# Patient Record
Sex: Female | Born: 1935
Health system: Southern US, Community
[De-identification: ages and names within clinical notes are randomized; demographics above are authoritative.]

## PROBLEM LIST (undated history)

## (undated) DIAGNOSIS — K219 Gastro-esophageal reflux disease without esophagitis: Secondary | ICD-10-CM

## (undated) DIAGNOSIS — J449 Chronic obstructive pulmonary disease, unspecified: Secondary | ICD-10-CM

## (undated) DIAGNOSIS — D649 Anemia, unspecified: Secondary | ICD-10-CM

## (undated) DIAGNOSIS — I209 Angina pectoris, unspecified: Secondary | ICD-10-CM

## (undated) DIAGNOSIS — C4492 Squamous cell carcinoma of skin, unspecified: Secondary | ICD-10-CM

## (undated) DIAGNOSIS — N760 Acute vaginitis: Secondary | ICD-10-CM

## (undated) DIAGNOSIS — E785 Hyperlipidemia, unspecified: Secondary | ICD-10-CM

## (undated) DIAGNOSIS — G8929 Other chronic pain: Secondary | ICD-10-CM

## (undated) DIAGNOSIS — R1310 Dysphagia, unspecified: Secondary | ICD-10-CM

## (undated) DIAGNOSIS — M51369 Other intervertebral disc degeneration, lumbar region without mention of lumbar back pain or lower extremity pain: Secondary | ICD-10-CM

## (undated) DIAGNOSIS — I4891 Unspecified atrial fibrillation: Secondary | ICD-10-CM

## (undated) DIAGNOSIS — M542 Cervicalgia: Secondary | ICD-10-CM

## (undated) DIAGNOSIS — I2510 Atherosclerotic heart disease of native coronary artery without angina pectoris: Secondary | ICD-10-CM

## (undated) DIAGNOSIS — I10 Essential (primary) hypertension: Secondary | ICD-10-CM

## (undated) DIAGNOSIS — C4491 Basal cell carcinoma of skin, unspecified: Secondary | ICD-10-CM

## (undated) DIAGNOSIS — F319 Bipolar disorder, unspecified: Secondary | ICD-10-CM

## (undated) DIAGNOSIS — M549 Dorsalgia, unspecified: Secondary | ICD-10-CM

## (undated) DIAGNOSIS — M5136 Other intervertebral disc degeneration, lumbar region: Secondary | ICD-10-CM

## (undated) DIAGNOSIS — N189 Chronic kidney disease, unspecified: Secondary | ICD-10-CM

## (undated) DIAGNOSIS — I509 Heart failure, unspecified: Secondary | ICD-10-CM

## (undated) DIAGNOSIS — E119 Type 2 diabetes mellitus without complications: Secondary | ICD-10-CM

## (undated) HISTORY — DX: Anemia, unspecified: D64.9

## (undated) HISTORY — DX: Chronic kidney disease, unspecified: N18.9

## (undated) HISTORY — DX: Acute vaginitis: N76.0

## (undated) HISTORY — DX: Unspecified atrial fibrillation: I48.91

## (undated) HISTORY — DX: Other intervertebral disc degeneration, lumbar region: M51.36

## (undated) HISTORY — DX: Essential (primary) hypertension: I10

## (undated) HISTORY — DX: Other intervertebral disc degeneration, lumbar region without mention of lumbar back pain or lower extremity pain: M51.369

## (undated) HISTORY — DX: Other chronic pain: G89.29

## (undated) HISTORY — PX: OTHER SURGICAL HISTORY: SHX169

## (undated) HISTORY — DX: Chronic obstructive pulmonary disease, unspecified: J44.9

## (undated) HISTORY — DX: Type 2 diabetes mellitus without complications: E11.9

## (undated) HISTORY — PX: VAGINAL HYSTERECTOMY: SUR661

## (undated) HISTORY — DX: Heart failure, unspecified: I50.9

## (undated) HISTORY — DX: Squamous cell carcinoma of skin, unspecified: C44.92

## (undated) HISTORY — DX: Dysphagia, unspecified: R13.10

## (undated) HISTORY — DX: Cervicalgia: M54.2

## (undated) HISTORY — DX: Basal cell carcinoma of skin, unspecified: C44.91

## (undated) HISTORY — DX: Other chronic pain: M54.9

## (undated) HISTORY — DX: Hyperlipidemia, unspecified: E78.5

## (undated) HISTORY — DX: Angina pectoris, unspecified: I20.9

---

## 2004-09-05 ENCOUNTER — Emergency Department: Payer: Self-pay | Admitting: Emergency Medicine

## 2004-11-03 ENCOUNTER — Ambulatory Visit: Payer: Self-pay | Admitting: Gastroenterology

## 2004-11-25 ENCOUNTER — Ambulatory Visit: Payer: Self-pay | Admitting: Gastroenterology

## 2004-12-09 ENCOUNTER — Ambulatory Visit (HOSPITAL_COMMUNITY): Admission: RE | Admit: 2004-12-09 | Discharge: 2004-12-09 | Payer: Self-pay | Admitting: Gastroenterology

## 2005-05-26 ENCOUNTER — Ambulatory Visit: Payer: Self-pay | Admitting: Family Medicine

## 2005-09-29 ENCOUNTER — Emergency Department: Payer: Self-pay | Admitting: Emergency Medicine

## 2006-03-17 ENCOUNTER — Ambulatory Visit: Payer: Self-pay | Admitting: Unknown Physician Specialty

## 2006-04-07 ENCOUNTER — Ambulatory Visit: Payer: Self-pay | Admitting: Family Medicine

## 2006-08-16 ENCOUNTER — Ambulatory Visit: Payer: Self-pay | Admitting: Family Medicine

## 2006-10-30 ENCOUNTER — Emergency Department: Payer: Self-pay | Admitting: Unknown Physician Specialty

## 2007-07-20 ENCOUNTER — Ambulatory Visit: Payer: Self-pay | Admitting: Ophthalmology

## 2007-07-20 ENCOUNTER — Other Ambulatory Visit: Payer: Self-pay

## 2007-07-31 ENCOUNTER — Ambulatory Visit: Payer: Self-pay | Admitting: Ophthalmology

## 2007-09-04 ENCOUNTER — Ambulatory Visit: Payer: Self-pay | Admitting: Ophthalmology

## 2007-09-25 ENCOUNTER — Ambulatory Visit: Payer: Self-pay | Admitting: Ophthalmology

## 2007-11-02 ENCOUNTER — Ambulatory Visit: Payer: Self-pay | Admitting: Family Medicine

## 2008-08-11 ENCOUNTER — Emergency Department: Payer: Self-pay | Admitting: Emergency Medicine

## 2008-10-08 ENCOUNTER — Ambulatory Visit: Payer: Self-pay | Admitting: Urology

## 2008-10-21 ENCOUNTER — Ambulatory Visit: Payer: Self-pay | Admitting: Urology

## 2008-10-24 ENCOUNTER — Ambulatory Visit: Payer: Self-pay | Admitting: Urology

## 2008-11-18 ENCOUNTER — Emergency Department: Payer: Self-pay | Admitting: Emergency Medicine

## 2008-11-26 ENCOUNTER — Ambulatory Visit: Payer: Self-pay | Admitting: Urology

## 2008-12-18 ENCOUNTER — Ambulatory Visit: Payer: Self-pay | Admitting: Family Medicine

## 2008-12-31 ENCOUNTER — Ambulatory Visit: Payer: Self-pay | Admitting: Unknown Physician Specialty

## 2009-01-20 ENCOUNTER — Ambulatory Visit: Payer: Self-pay | Admitting: Unknown Physician Specialty

## 2009-01-20 LAB — HM COLONOSCOPY

## 2009-06-05 ENCOUNTER — Ambulatory Visit: Payer: Self-pay | Admitting: Specialist

## 2009-06-12 ENCOUNTER — Ambulatory Visit: Payer: Self-pay | Admitting: Specialist

## 2009-07-16 ENCOUNTER — Ambulatory Visit: Payer: Self-pay | Admitting: Neurology

## 2009-12-19 ENCOUNTER — Ambulatory Visit: Payer: Self-pay | Admitting: Family Medicine

## 2010-08-17 ENCOUNTER — Ambulatory Visit: Payer: Self-pay | Admitting: Family Medicine

## 2010-09-02 ENCOUNTER — Ambulatory Visit: Payer: Self-pay | Admitting: Vascular Surgery

## 2011-04-19 ENCOUNTER — Ambulatory Visit: Payer: Self-pay | Admitting: Unknown Physician Specialty

## 2011-06-07 ENCOUNTER — Ambulatory Visit: Payer: Self-pay | Admitting: Unknown Physician Specialty

## 2011-07-19 ENCOUNTER — Ambulatory Visit: Payer: Self-pay | Admitting: Vascular Surgery

## 2011-10-26 DIAGNOSIS — C44211 Basal cell carcinoma of skin of unspecified ear and external auricular canal: Secondary | ICD-10-CM | POA: Insufficient documentation

## 2012-02-06 ENCOUNTER — Inpatient Hospital Stay: Payer: Self-pay | Admitting: Internal Medicine

## 2012-02-06 LAB — CBC
MCH: 25.6 pg — ABNORMAL LOW (ref 26.0–34.0)
MCV: 81 fL (ref 80–100)
Platelet: 256 10*3/uL (ref 150–440)
RDW: 15.9 % — ABNORMAL HIGH (ref 11.5–14.5)
WBC: 12.8 10*3/uL — ABNORMAL HIGH (ref 3.6–11.0)

## 2012-02-06 LAB — COMPREHENSIVE METABOLIC PANEL
Albumin: 3.7 g/dL (ref 3.4–5.0)
Alkaline Phosphatase: 73 U/L (ref 50–136)
Bilirubin,Total: 0.4 mg/dL (ref 0.2–1.0)
Calcium, Total: 9.1 mg/dL (ref 8.5–10.1)
EGFR (African American): 28 — ABNORMAL LOW
Glucose: 159 mg/dL — ABNORMAL HIGH (ref 65–99)
Osmolality: 272 (ref 275–301)
Potassium: 3.7 mmol/L (ref 3.5–5.1)
SGOT(AST): 22 U/L (ref 15–37)
SGPT (ALT): 20 U/L
Sodium: 133 mmol/L — ABNORMAL LOW (ref 136–145)
Total Protein: 8.1 g/dL (ref 6.4–8.2)

## 2012-02-06 LAB — TROPONIN I: Troponin-I: <0.02 ng/mL

## 2012-02-06 LAB — CK TOTAL AND CKMB (NOT AT ARMC): CK-MB: <0.5 ng/mL — ABNORMAL LOW (ref 0.5–3.6)

## 2012-02-08 LAB — CBC WITH DIFFERENTIAL/PLATELET
Basophil %: 0.7 %
Eosinophil #: 0.3 10*3/uL (ref 0.0–0.7)
HCT: 30.1 % — ABNORMAL LOW (ref 35.0–47.0)
HGB: 9.9 g/dL — ABNORMAL LOW (ref 12.0–16.0)
Lymphocyte #: 2.5 10*3/uL (ref 1.0–3.6)
Lymphocyte %: 33.1 %
MCV: 80 fL (ref 80–100)
Monocyte #: 1 x10 3/mm — ABNORMAL HIGH (ref 0.2–0.9)
Neutrophil %: 49.6 %
Platelet: 206 10*3/uL (ref 150–440)
RBC: 3.76 10*6/uL — ABNORMAL LOW (ref 3.80–5.20)
RDW: 15.9 % — ABNORMAL HIGH (ref 11.5–14.5)

## 2012-02-08 LAB — BASIC METABOLIC PANEL
BUN: 14 mg/dL (ref 7–18)
Creatinine: 1.46 mg/dL — ABNORMAL HIGH (ref 0.60–1.30)
EGFR (Non-African Amer.): 34 — ABNORMAL LOW
Glucose: 133 mg/dL — ABNORMAL HIGH (ref 65–99)
Osmolality: 274 (ref 275–301)
Sodium: 136 mmol/L (ref 136–145)

## 2012-04-05 ENCOUNTER — Encounter: Payer: Self-pay | Admitting: Sports Medicine

## 2012-04-27 ENCOUNTER — Encounter: Payer: Self-pay | Admitting: Sports Medicine

## 2012-07-01 ENCOUNTER — Emergency Department: Payer: Self-pay | Admitting: Emergency Medicine

## 2012-08-15 DIAGNOSIS — R351 Nocturia: Secondary | ICD-10-CM | POA: Insufficient documentation

## 2012-09-05 DIAGNOSIS — R339 Retention of urine, unspecified: Secondary | ICD-10-CM | POA: Insufficient documentation

## 2012-10-12 ENCOUNTER — Ambulatory Visit: Payer: Self-pay | Admitting: Family Medicine

## 2012-11-28 DIAGNOSIS — N949 Unspecified condition associated with female genital organs and menstrual cycle: Secondary | ICD-10-CM | POA: Insufficient documentation

## 2013-09-24 ENCOUNTER — Ambulatory Visit: Payer: Self-pay | Admitting: Obstetrics and Gynecology

## 2013-10-16 ENCOUNTER — Ambulatory Visit: Payer: Self-pay | Admitting: Orthopedic Surgery

## 2014-05-01 DIAGNOSIS — C44310 Basal cell carcinoma of skin of unspecified parts of face: Secondary | ICD-10-CM | POA: Insufficient documentation

## 2014-06-26 DIAGNOSIS — R079 Chest pain, unspecified: Secondary | ICD-10-CM | POA: Insufficient documentation

## 2014-06-26 DIAGNOSIS — I10 Essential (primary) hypertension: Secondary | ICD-10-CM | POA: Insufficient documentation

## 2014-06-26 DIAGNOSIS — I739 Peripheral vascular disease, unspecified: Secondary | ICD-10-CM

## 2014-06-26 DIAGNOSIS — M791 Myalgia, unspecified site: Secondary | ICD-10-CM | POA: Insufficient documentation

## 2014-06-26 DIAGNOSIS — I6529 Occlusion and stenosis of unspecified carotid artery: Secondary | ICD-10-CM | POA: Insufficient documentation

## 2014-07-08 DIAGNOSIS — Z86718 Personal history of other venous thrombosis and embolism: Secondary | ICD-10-CM | POA: Insufficient documentation

## 2014-07-08 DIAGNOSIS — J449 Chronic obstructive pulmonary disease, unspecified: Secondary | ICD-10-CM | POA: Insufficient documentation

## 2014-07-08 DIAGNOSIS — E1129 Type 2 diabetes mellitus with other diabetic kidney complication: Secondary | ICD-10-CM | POA: Insufficient documentation

## 2014-07-08 DIAGNOSIS — J441 Chronic obstructive pulmonary disease with (acute) exacerbation: Secondary | ICD-10-CM | POA: Insufficient documentation

## 2014-07-08 DIAGNOSIS — F411 Generalized anxiety disorder: Secondary | ICD-10-CM | POA: Insufficient documentation

## 2014-07-08 DIAGNOSIS — N189 Chronic kidney disease, unspecified: Secondary | ICD-10-CM | POA: Insufficient documentation

## 2014-07-08 DIAGNOSIS — E119 Type 2 diabetes mellitus without complications: Secondary | ICD-10-CM | POA: Insufficient documentation

## 2014-07-08 DIAGNOSIS — C439 Malignant melanoma of skin, unspecified: Secondary | ICD-10-CM | POA: Insufficient documentation

## 2014-07-08 DIAGNOSIS — K921 Melena: Secondary | ICD-10-CM | POA: Insufficient documentation

## 2014-07-08 DIAGNOSIS — R269 Unspecified abnormalities of gait and mobility: Secondary | ICD-10-CM | POA: Insufficient documentation

## 2014-07-08 DIAGNOSIS — K219 Gastro-esophageal reflux disease without esophagitis: Secondary | ICD-10-CM | POA: Insufficient documentation

## 2014-07-10 DIAGNOSIS — I480 Paroxysmal atrial fibrillation: Secondary | ICD-10-CM | POA: Insufficient documentation

## 2014-10-13 ENCOUNTER — Emergency Department: Payer: Self-pay | Admitting: Emergency Medicine

## 2014-10-13 LAB — BASIC METABOLIC PANEL
Anion Gap: 8 (ref 7–16)
BUN: 21 mg/dL — ABNORMAL HIGH (ref 7–18)
CALCIUM: 8.8 mg/dL (ref 8.5–10.1)
CHLORIDE: 98 mmol/L (ref 98–107)
Co2: 31 mmol/L (ref 21–32)
Creatinine: 1.57 mg/dL — ABNORMAL HIGH (ref 0.60–1.30)
EGFR (African American): 41 — ABNORMAL LOW
EGFR (Non-African Amer.): 33 — ABNORMAL LOW
GLUCOSE: 94 mg/dL (ref 65–99)
OSMOLALITY: 277 (ref 275–301)
POTASSIUM: 3.6 mmol/L (ref 3.5–5.1)
SODIUM: 137 mmol/L (ref 136–145)

## 2014-10-13 LAB — CBC
HCT: 41.6 % (ref 35.0–47.0)
HGB: 13.9 g/dL (ref 12.0–16.0)
MCH: 30.4 pg (ref 26.0–34.0)
MCHC: 33.4 g/dL (ref 32.0–36.0)
MCV: 91 fL (ref 80–100)
PLATELETS: 251 10*3/uL (ref 150–440)
RBC: 4.57 10*6/uL (ref 3.80–5.20)
RDW: 13.2 % (ref 11.5–14.5)
WBC: 11.6 10*3/uL — ABNORMAL HIGH (ref 3.6–11.0)

## 2014-10-13 LAB — TROPONIN I: Troponin-I: <0.02 ng/mL

## 2014-10-13 LAB — PRO B NATRIURETIC PEPTIDE: B-TYPE NATIURETIC PEPTID: 113 pg/mL (ref 0–450)

## 2014-12-30 DIAGNOSIS — E782 Mixed hyperlipidemia: Secondary | ICD-10-CM | POA: Insufficient documentation

## 2014-12-31 ENCOUNTER — Ambulatory Visit
Admit: 2014-12-31 | Disposition: A | Discharge status: Home / Self Care | Payer: Self-pay | Attending: Family Medicine | Admitting: Family Medicine

## 2015-01-19 NOTE — Discharge Summary (Signed)
PATIENT NAME:  [name], [name] MR#:  [id number] DATE OF BIRTH:  August 23, 1929  DATE OF ADMISSION:  02/06/2012 DATE OF DISCHARGE:  02/08/2012  ADMITTING DIAGNOSIS: Fever cough, congestion.   DISCHARGE DIAGNOSES:  1. Fever, cough, congestion possibly due to SIRS syndrome as a result of acute bronchitis. Chest x-ray negative for pneumonia. Will be on p.o. antibiotics. Is currently afebrile.  2. Acute on chronic renal failure due to volume depletion, diuretic therapy. Diuretics are on hold.  3. Hypertension.  4. Type II diabetes. The patient was on metformin due to elevated creatinine. Recommend discontinuing metformin. The patient will be on glipizide.  5. Gastroesophageal reflux disease.  6. Anemia, likely anemia of chronic disease. Needs further evaluation as outpatient to determine if she needs any further evaluation such as EGD or colonoscopy. I will let her primary MD arrange that.  7. History of peripheral arterial disease.  8. Status post stent in the right leg due to blood clot.  9. Psoriasis.  10. Status post hysterectomy.  11. Status post cholecystectomy.   CONSULTANTS: None.   PERTINENT LABS AND EVALUATIONS: Admitting EKG showed sinus tachycardia.   Chest x-ray showed per Radiology interpretation findings suggestive of reactive airway disease or chronic obstructive pulmonary disease. No evidence of pneumonia or congestive heart failure.  Admitting WBC count 12.8, hemoglobin 11.1, platelet count 256. WBC count this morning 7.4, hemoglobin 9.9 after hydration. BMP on presentation showed glucose 159, BUN 18, creatinine 1.9, sodium 133, potassium 3.7, chloride 96, CO2 24. LFTs were normal. Troponin less than 0.02. Subsequent creatinine on May 14th was 1.46.   HOSPITAL COURSE: Please see history and physical done by the admitting physician. The patient is an 81 year old white female with history of hypertension and diabetes who came to the ED with complaint of fever, cough,  congestion. She also had some nausea and vomiting in the ED. The patient on presentation was noted to have a fever as well as tachycardia. Initially the chest x-ray was thought to have pneumonia. She was admitted and placed on IV Levaquin. With these treatments, she started to feel better. Official chest x-ray reading showed no pneumonia. Her cough is improved. Her WBC count has normalized. The patient also was felt to be a little dehydrated on presentation and was given IV fluids. Her diuretics were held. Her creatinine which was elevated on admission improved. She is a diabetic and her metformin had to be held due to creatinine being 1.90. She does have chronic renal failure which is mild, although her creatinine is around 1.40. Therefore, metformin has been discontinued and she was started on glipizide. At this time she is stable for discharge.   DISCHARGE MEDICATIONS:  1. Aspirin 81 one tab p.o. daily.  2. Alprazolam 0.5 one tab at bedtime.  3. Fluoxetine 40 q.a.m.  4. Diltiazem 180 daily.  5. Losartan 50 daily.  6. Protonix 40 daily.  7. PreserVision 1 tab p.o. daily.  8. Levaquin 500 p.o. daily x3 days.  9. Glipizide 5 mg p.o. daily.   DO NOT TAKE: The patient is told not to take metformin and not to take HCTZ/triamterene.  HOME OXYGEN: None.   DIET: Low sodium ADA diet.   FOLLOW-UP: Follow-up in 1 to 2 weeks with Dr. Venia Minks, primary MD.   TIME SPENT: 32 minutes.   ____________________________ Lafonda Mosses Posey Pronto, MD shp:drc D: 02/08/2012 13:05:09 ET T: 02/09/2012 09:02:01 ET JOB#: [id number] cc: Larae Caison H. Posey Pronto, MD, <Dictator>, Jerrell Belfast, MD [place] MD ELECTRONICALLY SIGNED  02/11/2012 14:44 

## 2015-01-19 NOTE — H&P (Signed)
PATIENT NAME:  [name], [name] MR#:  [id number] DATE OF BIRTH:  02/28/1929  DATE OF ADMISSION:  02/06/2012  PRIMARY CARE PHYSICIAN: Dr. Venia Minks   CHIEF COMPLAINT: Fever, cough and congestion.   HISTORY OF PRESENT ILLNESS: [name] is an 81 year old Caucasian female with past medical history of hypertension and diabetes comes to the Emergency Room with complaints of fever, cough, congestion, and headache since yesterday. She had a low-grade fever of 99.1, was mildly tachycardic, had vomited x4 in the Emergency Room. She was feeling quite nauseated, appeared somewhat dehydrated clinically. Chest x-ray shows possible early right lower lobe infiltrate versus atelectasis. Patient is being admitted for SIRS secondary to upper respiratory infection/acute bronchitis/questionable early infiltrate right lower lobe.    PAST MEDICAL HISTORY:  1. History of peripheral arterial disease.  2. Stent in the right leg due to blood clot.  3. Psoriasis.  4. Type 2 diabetes.  5. Hypertension.  6. Hysterectomy.  7. Cholecystectomy.   ALLERGIES: Ibuprofen, Motrin, Neosporin, Polysporin and adhesive tape.   MEDICATIONS:  1. Xanax 0.5 mg p.o. daily at bedtime.  2. Aspirin 81 mg daily.  3. Diltiazem CD 180 mg p.o. daily.  4. Fluoxetine 40 mg p.o. daily.  5. Hydrochlorothiazide/triamterene 25/37.5 mg p.o. daily.  6. Losartan 50 mg daily.  7. Metformin 1000 mg b.i.d.  8. Pantoprazole 40 mg daily.  9. PreserVision 1 tablet daily.   FAMILY HISTORY: Father died of myocardial infarction.   SOCIAL HISTORY: Patient lives at home. Nonsmoker. Nonalcoholic.   REVIEW OF SYSTEMS: CONSTITUTIONAL: Positive for fever, fatigue, weakness. EYES: No blurred or double vision. ENT: No tinnitus, ear pain, hearing loss. RESPIRATORY: Positive for some cough, sore throat. Mild dyspnea. No chronic obstructive pulmonary disease. CARDIOVASCULAR: No chest pain, orthopnea, edema. Positive for hypertension. GASTROINTESTINAL:  Positive for nausea, vomiting, and gastroesophageal reflux disease. No abdominal pain, melena or rectal bleed. GENITOURINARY: No dysuria, hematuria. ENDOCRINE: No polyuria, nocturia. HEMATOLOGY: No anemia or easy bruising. SKIN: No acne, rash. MUSCULOSKELETAL: Positive for arthritis. NEUROLOGIC: No cerebrovascular accident, transient ischemic attack. PSYCH: No anxiety or depression. All other systems reviewed and negative.   PHYSICAL EXAMINATION:  GENERAL: Patient is awake, alert, oriented x3, not in acute distress.   VITAL SIGNS: Temperature 99.1, pulse 99, respirations 20, blood pressure 199/71, sats 91% on room air.   HEENT: Atraumatic, normocephalic. Pupils are equal, round, and reactive to light and accommodation. Extraocular movements intact. Oral mucosa is dry.   NECK: Supple. No JVD. No carotid bruit.   RESPIRATORY: Decreased breath sounds in the bases. Minimal few crackles are present in the right lower lobe. No respiratory distress or labored breathing or use of accessory muscles.   CARDIOVASCULAR: Tachycardia present. No murmur heard. PMI not lateralized. Chest nontender.   EXTREMITIES: Good pedal pulses, good femoral pulses. No lower extremity edema.   ABDOMEN: Soft, benign, nontender. No organomegaly. Positive bowel sounds.   NEUROLOGIC: Grossly intact cranial nerves II through XII. No motor or sensory deficits.   PSYCH: Patient is awake, alert, oriented x3.   LABORATORY, DIAGNOSTIC AND RADIOLOGICAL DATA: EKG shows sinus tachycardia. Chest x-ray shows possible right lower lobe infiltrate versus atelectasis. White count 12.8, hemoglobin and hematocrit 11.1 and 35.4, platelet count 256, glucose 159, BUN 18, creatinine 1.9, sodium 133, potassium 3.7, chloride 96, bicarbonate 24. LFTs within normal limits. Hemoglobin and hematocrit 11.1 and 35.6, platelet count 256.   ASSESSMENT: 81 year old [name] with: 1. Systemic inflammatory response syndrome due to left lower lobe  infiltrate versus acute bronchitis/upper  respiratory infection congestion. Patient presented with low-grade fever, tachycardia and elevated white count.  2. Nausea, vomiting and dehydration, baseline creatinine around 1.4.  3. Hypertension.  4. Type 2 diabetes.  5. Gastroesophageal reflux disease.   PLAN:  1. Admit patient to medical floor.  2. FULL CODE.  3. Will continue IV fluids for hydration.  4. Continue IV Levaquin 500 mg daily.  5. I will continue home medications except hold off on losartan, hydrochlorothiazide and metformin given elevated creatinine. Continue rest of the medications.  6. P.R.N. hydralazine for elevated blood pressure.  7. Follow-up CBC, blood cultures and sputum culture if patient able to produce.  8. Will start patient on clear liquid diet, advance as tolerated when vomiting subsides.  9. Further work-up according to patient's clinical course. Hospital admission plan was discussed with patient and patient's husband who was present in the Emergency Room.   TIME SPENT: 50 minutes.    ____________________________ Hart Rochester Posey Pronto, MD sap:cms D: 02/06/2012 21:26:44 ET T: 02/07/2012 06:19:35 ET  JOB#: [id number] cc: Karron Goens A. Posey Pronto, MD, <Dictator> Jerrell Belfast, MD Ilda Basset MD ELECTRONICALLY SIGNED 02/07/2012 9:42

## 2015-01-23 ENCOUNTER — Emergency Department
Admit: 2015-01-23 | Disposition: A | Discharge status: Home / Self Care | Payer: Self-pay | Admitting: Emergency Medicine

## 2015-01-23 LAB — URINALYSIS, COMPLETE
BACTERIA: NONE SEEN
BLOOD: NEGATIVE
Bilirubin,UR: NEGATIVE
GLUCOSE, UR: NEGATIVE mg/dL (ref 0–75)
Ketone: NEGATIVE
LEUKOCYTE ESTERASE: NEGATIVE
NITRITE: NEGATIVE
PROTEIN: NEGATIVE
Ph: 8 (ref 4.5–8.0)
SPECIFIC GRAVITY: 1.013 (ref 1.003–1.030)
Squamous Epithelial: NONE SEEN

## 2015-03-10 ENCOUNTER — Other Ambulatory Visit: Payer: Self-pay | Admitting: Family Medicine

## 2015-03-10 DIAGNOSIS — F419 Anxiety disorder, unspecified: Secondary | ICD-10-CM

## 2015-03-10 NOTE — Telephone Encounter (Signed)
RX printed. Please fax to [facility]. Thanks.

## 2015-03-10 NOTE — Telephone Encounter (Signed)
Last refill 09/04/2014. LOV 12/16/2014. Renaldo Fiddler, CMA

## 2015-04-01 ENCOUNTER — Other Ambulatory Visit: Payer: Self-pay | Admitting: Family Medicine

## 2015-04-01 DIAGNOSIS — G4700 Insomnia, unspecified: Secondary | ICD-10-CM

## 2015-04-01 DIAGNOSIS — R059 Cough, unspecified: Secondary | ICD-10-CM

## 2015-04-01 DIAGNOSIS — R05 Cough: Secondary | ICD-10-CM | POA: Insufficient documentation

## 2015-04-10 ENCOUNTER — Other Ambulatory Visit: Payer: Self-pay | Admitting: Family Medicine

## 2015-04-10 DIAGNOSIS — R05 Cough: Secondary | ICD-10-CM

## 2015-04-10 DIAGNOSIS — R059 Cough, unspecified: Secondary | ICD-10-CM

## 2015-04-10 NOTE — Telephone Encounter (Signed)
Pt contacted office for refill request on the following medications:  Hydrocodone-Homatropine 5-1.5mg /68ml.  CB#[phone]/[id number]

## 2015-04-11 MED ORDER — HYDROCODONE-HOMATROPINE 5-1.5 MG/5ML PO SYRP: 5.0000 mL | ORAL_SOLUTION | Freq: Two times a day (BID) | ORAL | Status: DC | PRN

## 2015-04-11 NOTE — Telephone Encounter (Signed)
Printed, please notify patient. Thanks.

## 2015-04-24 ENCOUNTER — Other Ambulatory Visit: Payer: Self-pay | Admitting: Family Medicine

## 2015-04-24 NOTE — Telephone Encounter (Signed)
Last ov was on 12/16/2014

## 2015-05-27 ENCOUNTER — Telehealth: Payer: Self-pay | Admitting: Family Medicine

## 2015-05-27 NOTE — Telephone Encounter (Signed)
Ok to refer? Please advise. Thanks!  

## 2015-05-27 NOTE — Telephone Encounter (Signed)
Pt is requesting a referral to see Dr. Aretta Nip with [facility]. Pt stated that she had been referred to him in the past and she has an appointment on Thursday 05/29/15. Pt stated that he has been treating her for a yeast infection. Thanks TNP

## 2015-05-29 ENCOUNTER — Telehealth: Payer: Self-pay | Admitting: Family Medicine

## 2015-05-29 ENCOUNTER — Encounter: Payer: Self-pay | Admitting: Obstetrics and Gynecology

## 2015-05-29 ENCOUNTER — Ambulatory Visit (INDEPENDENT_AMBULATORY_CARE_PROVIDER_SITE_OTHER): Payer: Commercial Managed Care - HMO | Admitting: Obstetrics and Gynecology

## 2015-05-29 VITALS — BP 186/70 | HR 81 | Ht 61.0 in | Wt 159.0 lb

## 2015-05-29 DIAGNOSIS — N760 Acute vaginitis: Secondary | ICD-10-CM | POA: Diagnosis not present

## 2015-05-29 DIAGNOSIS — N763 Subacute and chronic vulvitis: Secondary | ICD-10-CM | POA: Diagnosis not present

## 2015-05-29 HISTORY — PX: VULVA / PERINEUM BIOPSY: SUR155

## 2015-05-29 NOTE — Telephone Encounter (Signed)
Line is busy will try again. sd

## 2015-05-29 NOTE — Telephone Encounter (Signed)
Pt went to Dr Enzo Bi this am about her yeast infections.  She said they told her her blood pressure was 186/70 when she first checked in .  He decided to do 2 biopsies.  Checking for lichen schlerosis.  They checked her blood pressure again before she left and 199/70.  They wanted her to call you and let you know this.  Her call back  [phone].    Thanks [employer]

## 2015-05-29 NOTE — Patient Instructions (Signed)
1. Two vulvar biopsies taken. 2.  OK to shower or take bath. 3.  DO NOT scrub or rub biopsy sites.Use hair dryer to blow dry bottom. 4. Return in 2 weeks for recheck.  Condition may be Lichen Sclerosis

## 2015-05-29 NOTE — Progress Notes (Signed)
Patient ID: [name], female   DOB: 23-Nov-1928, 81 y.o.   MRN: [id number]   Chief complaint: 1.  Chronic vulvitis. 2.  Diabetes mellitus   Recurrent vulvovaginits using nystatin triamcinolone- helps but comes right back  Past history, past surgical history, problem list, medications, allergies, are reviewed and updated.  OBJECTIVE: BP 186/70 mmHg  Pulse 81  Ht 5\' 1"  (1.549 m)  Wt 159 lb (72.122 kg)  BMI 30.06 kg/m2 Pleasant, well-appearing, elderly female in no acute distress. Abdomen: Soft, nontender. Pelvic exam: External genitalia-atrophic changes; Symmetric perianal inflammation is noted.  Faint leukoplakia along right labia majora and in the perianal region.  Procedure: Vulvar biopsy 2: Verbal consent obtained.  Betadine cleansing performed.  One percent lidocaine without epinephrine was injected locally for anesthesia.  3 mm punch biopsy is taken of right labia majora and right perianal region.  3-0 Vicryl sutures placed for hemostasis.  Blood loss-minimal.Tolerated procedure well.  IMPRESSION: 1.  Chronic vulvitis, possibly lichen sclerosis versus chronic monilia infection. 2.  Diabetes mellitus, suboptimally controlled.  PLAN: 1.  Vulvar biopsy 2. 2.  Local wound care. 3.  Return in 2 weeks for follow-up and further management planning.

## 2015-05-29 NOTE — Telephone Encounter (Signed)
Patient reports she is feeling well. Patient denies headache, lightheadedness, and swelling. Patient reports she is not checking her BP at home. sd Per Dr. Venia Minks patient should come in for an ov. sd

## 2015-05-30 ENCOUNTER — Encounter: Payer: Self-pay | Admitting: Family Medicine

## 2015-05-30 ENCOUNTER — Ambulatory Visit (INDEPENDENT_AMBULATORY_CARE_PROVIDER_SITE_OTHER): Payer: Commercial Managed Care - HMO | Admitting: Family Medicine

## 2015-05-30 VITALS — BP 138/72 | HR 61 | Temp 97.9°F | Resp 16 | Ht 61.0 in | Wt 159.0 lb

## 2015-05-30 DIAGNOSIS — M549 Dorsalgia, unspecified: Secondary | ICD-10-CM | POA: Insufficient documentation

## 2015-05-30 DIAGNOSIS — R350 Frequency of micturition: Secondary | ICD-10-CM | POA: Insufficient documentation

## 2015-05-30 DIAGNOSIS — L409 Psoriasis, unspecified: Secondary | ICD-10-CM | POA: Insufficient documentation

## 2015-05-30 DIAGNOSIS — R600 Localized edema: Secondary | ICD-10-CM

## 2015-05-30 DIAGNOSIS — I739 Peripheral vascular disease, unspecified: Secondary | ICD-10-CM | POA: Insufficient documentation

## 2015-05-30 DIAGNOSIS — R300 Dysuria: Secondary | ICD-10-CM | POA: Insufficient documentation

## 2015-05-30 DIAGNOSIS — G4700 Insomnia, unspecified: Secondary | ICD-10-CM | POA: Diagnosis not present

## 2015-05-30 DIAGNOSIS — R2681 Unsteadiness on feet: Secondary | ICD-10-CM | POA: Insufficient documentation

## 2015-05-30 DIAGNOSIS — E7800 Pure hypercholesterolemia, unspecified: Secondary | ICD-10-CM | POA: Insufficient documentation

## 2015-05-30 DIAGNOSIS — D649 Anemia, unspecified: Secondary | ICD-10-CM | POA: Insufficient documentation

## 2015-05-30 DIAGNOSIS — I10 Essential (primary) hypertension: Secondary | ICD-10-CM

## 2015-05-30 DIAGNOSIS — N200 Calculus of kidney: Secondary | ICD-10-CM | POA: Insufficient documentation

## 2015-05-30 DIAGNOSIS — K2289 Other specified disease of esophagus: Secondary | ICD-10-CM | POA: Insufficient documentation

## 2015-05-30 DIAGNOSIS — J309 Allergic rhinitis, unspecified: Secondary | ICD-10-CM | POA: Insufficient documentation

## 2015-05-30 DIAGNOSIS — R609 Edema, unspecified: Secondary | ICD-10-CM | POA: Insufficient documentation

## 2015-05-30 DIAGNOSIS — E559 Vitamin D deficiency, unspecified: Secondary | ICD-10-CM | POA: Insufficient documentation

## 2015-05-30 DIAGNOSIS — W19XXXA Unspecified fall, initial encounter: Secondary | ICD-10-CM | POA: Insufficient documentation

## 2015-05-30 DIAGNOSIS — I130 Hypertensive heart and chronic kidney disease with heart failure and stage 1 through stage 4 chronic kidney disease, or unspecified chronic kidney disease: Secondary | ICD-10-CM | POA: Insufficient documentation

## 2015-05-30 DIAGNOSIS — E871 Hypo-osmolality and hyponatremia: Secondary | ICD-10-CM | POA: Insufficient documentation

## 2015-05-30 DIAGNOSIS — M25569 Pain in unspecified knee: Secondary | ICD-10-CM | POA: Insufficient documentation

## 2015-05-30 DIAGNOSIS — L299 Pruritus, unspecified: Secondary | ICD-10-CM | POA: Insufficient documentation

## 2015-05-30 DIAGNOSIS — Z6829 Body mass index (BMI) 29.0-29.9, adult: Secondary | ICD-10-CM | POA: Insufficient documentation

## 2015-05-30 DIAGNOSIS — Z86718 Personal history of other venous thrombosis and embolism: Secondary | ICD-10-CM | POA: Insufficient documentation

## 2015-05-30 DIAGNOSIS — K228 Other specified diseases of esophagus: Secondary | ICD-10-CM | POA: Insufficient documentation

## 2015-05-30 LAB — PATHOLOGY

## 2015-05-30 MED ORDER — ALPRAZOLAM 0.25 MG PO TABS: 0.2500 mg | ORAL_TABLET | Freq: Every evening | ORAL | Status: DC | PRN

## 2015-05-30 NOTE — Progress Notes (Signed)
Patient ID: [name], female   DOB: October 02, 1928, 81 y.o.   MRN: [id number]       Patient: [name] Female    DOB: 06-09-1929   81 y.o.   MRN: [id number] Visit Date: 05/30/2015  Today's Provider: Margarita Rana, MD   Chief Complaint  Patient presents with  . Hypertension   Subjective:    HPI  Hypertension, follow-up:  BP Readings from Last 3 Encounters:  05/30/15 138/72  05/29/15 186/70    She was last seen for hypertension 1 days ago at [facility].  BP at that visit was 186/70, before procedure at [facility]YN. 199/70 after procedure. Management changes since that visit include none. She reports excellent compliance with treatment. Takes her medication regularly.  She is not having side effects.  She is not exercising. She is adherent to low salt diet.   Outside blood pressures are not being checked. She is experiencing none.  Patient denies chest pain.   Cardiovascular risk factors include advanced age (older than 72 for men, 38 for women). Patient reports that she was nervous and anxious yesterday and reports that it may have caused her BP to be high.  Feels well today.      Weight trend: stable Wt Readings from Last 3 Encounters:  05/30/15 159 lb (72.122 kg)  05/29/15 159 lb (72.122 kg)    Current diet: in general, a "healthy" diet    ------------------------------------------------------------------------       Allergies  Allergen Reactions  . Neomycin-Bacitracin Zn-Polymyx Swelling  . Ibuprofen     Tachycardia.  . Valdecoxib Nausea And Vomiting  . Albuterol Rash  . Latex Rash  . Tape Rash    blisters  . Triamcinolone Rash   Previous Medications   ALPRAZOLAM (XANAX) 0.5 MG TABLET    TAKE ONE TABLET EVERY DAY   ASPIRIN 325 MG TABLET    Take 325 mg by mouth daily.    BENZONATATE (TESSALON) 100 MG CAPSULE    TAKE 1 TO 2 CAPSULES BY MOUTH 3 TIMES DAILY AS NEEDED   DILTIAZEM (CARDIZEM CD) 180 MG 24 HR CAPSULE    TAKE 1 CAP EVERY DAY   FLUOXETINE (PROZAC) 20 MG CAPSULE    Take 20 mg by mouth daily.    GLUCOSE BLOOD TEST STRIP       INSULIN GLARGINE (LANTUS SOLOSTAR) 100 UNIT/ML SOLOSTAR PEN    INJECT 15 UNITS SUBQ AT BEDTIME   INSULIN LISPRO (HUMALOG KWIKPEN) 100 UNIT/ML KIWKPEN    Inject into the skin.   LOSARTAN-HYDROCHLOROTHIAZIDE (HYZAAR) 50-12.5 MG PER TABLET    TAKE ONE TABLET EVERY DAY   MELATONIN 1 MG TABS    Take 3 mg by mouth daily.    PANTOPRAZOLE (PROTONIX) 40 MG TABLET    Take by mouth.   TRAZODONE (DESYREL) 50 MG TABLET    TAKE ONE TABLET BY MOUTH AT BEDTIME    Review of Systems  Constitutional: Negative.   Respiratory: Negative.   Cardiovascular: Negative.     Social History  Substance Use Topics  . Smoking status: Never Smoker   . Smokeless tobacco: Never Used  . Alcohol Use: No   Objective:   BP 138/72 mmHg  Pulse 61  Temp(Src) 97.9 F (36.6 C) (Oral)  Resp 16  Ht 5\' 1"  (1.549 m)  Wt 159 lb (72.122 kg)  BMI 30.06 kg/m2  SpO2 96%  Physical Exam  Constitutional: She is oriented to person, place, and time. She appears well-developed and well-nourished.  Cardiovascular: Normal  rate and regular rhythm.   Pulmonary/Chest: Effort normal and breath sounds normal.  Neurological: She is alert and oriented to person, place, and time.  Psychiatric: She has a normal mood and affect. Her behavior is normal. Judgment and thought content normal.       Assessment & Plan:     1. Benign essential HTN Stable.  Was elevated at [facility] but was nervous about procedure.   2. Insomnia Will work on decreasing some medication. Will try to taper off the Xanax, talked about it with patient and daughter and stop  Benadryl.    Margarita Rana, MD      Margarita Rana, MD  [facility]

## 2015-06-02 DIAGNOSIS — N763 Subacute and chronic vulvitis: Secondary | ICD-10-CM | POA: Insufficient documentation

## 2015-06-11 ENCOUNTER — Ambulatory Visit (INDEPENDENT_AMBULATORY_CARE_PROVIDER_SITE_OTHER): Payer: Commercial Managed Care - HMO | Admitting: Obstetrics and Gynecology

## 2015-06-11 ENCOUNTER — Encounter: Payer: Self-pay | Admitting: Obstetrics and Gynecology

## 2015-06-11 VITALS — BP 158/69 | HR 112 | Ht 61.0 in | Wt 158.0 lb

## 2015-06-11 DIAGNOSIS — N763 Subacute and chronic vulvitis: Secondary | ICD-10-CM | POA: Diagnosis not present

## 2015-06-11 MED ORDER — CLOBETASOL PROPIONATE 0.05 % EX OINT: TOPICAL_OINTMENT | CUTANEOUS | Status: DC

## 2015-06-11 NOTE — Patient Instructions (Signed)
1.  Apply the Temovate ointment twice a day for 2 weeks and then once a day for 2 weeks. 2.  Use a hair dryer to blow dry the perineum following showering. 3.  Return in 4 weeks for follow-up. 4.  The biopsy of the vulva shows chronic inflammation.  This will be best treated with the Temovate ointment.

## 2015-06-11 NOTE — Progress Notes (Signed)
Patient ID: [name], female   DOB: October 25, 1928, 81 y.o.   MRN: [id number]  Chief complaint: 1.  Chronic vulvitis. 2.  Follow-up on vulvar biopsy  2 week f/u vulvar bx resuts-  Sore slight oozing  No fevers  Pathology: Chronic inflammation with hyperkeratosis, parakeratosis, and acanthosis  OBJECTIVE: BP 158/69 mmHg  Pulse 112  Ht 5\' 1"  (1.549 m)  Wt 158 lb (71.668 kg)  BMI 29.87 kg/m2   Pleasant, well-appearing, elderly female in no acute distress. Abdomen: Soft, nontender. Pelvic exam: External genitalia-atrophic changes; Symmetric perianal inflammation is noted.  Biopsy sites are healing well with one residual Suture present.  IMPRESSION: 1.  Chronic vulvitis. 2.  Biopsy, benign.  PLAN: 1.  Temovate ointment twice a day 2 weeks followed by once a day 2 weeks. 2.  Return in 4 weeks for follow-up. 3.  Blowdry perineum when showering. 4.  Maintain control of blood sugar.  A total of 15 minutes were spent face-to-face with the patient during this encounter and over half of that time dealt with counseling and coordination of care.  Brayton Mars, MD

## 2015-06-24 ENCOUNTER — Encounter: Payer: Self-pay | Admitting: Family Medicine

## 2015-06-24 ENCOUNTER — Ambulatory Visit (INDEPENDENT_AMBULATORY_CARE_PROVIDER_SITE_OTHER): Payer: Commercial Managed Care - HMO | Admitting: Family Medicine

## 2015-06-24 VITALS — BP 168/72 | HR 76 | Temp 98.3°F | Resp 16 | Wt 158.0 lb

## 2015-06-24 DIAGNOSIS — Z23 Encounter for immunization: Secondary | ICD-10-CM

## 2015-06-24 DIAGNOSIS — G4700 Insomnia, unspecified: Secondary | ICD-10-CM | POA: Diagnosis not present

## 2015-06-24 DIAGNOSIS — E119 Type 2 diabetes mellitus without complications: Secondary | ICD-10-CM | POA: Diagnosis not present

## 2015-06-24 DIAGNOSIS — E1159 Type 2 diabetes mellitus with other circulatory complications: Secondary | ICD-10-CM | POA: Insufficient documentation

## 2015-06-24 DIAGNOSIS — F411 Generalized anxiety disorder: Secondary | ICD-10-CM

## 2015-06-24 DIAGNOSIS — I10 Essential (primary) hypertension: Secondary | ICD-10-CM | POA: Diagnosis not present

## 2015-06-24 MED ORDER — FLUOXETINE HCL 20 MG PO CAPS: 40.0000 mg | ORAL_CAPSULE | Freq: Every day | ORAL | Status: DC

## 2015-06-24 MED ORDER — TRAZODONE HCL 50 MG PO TABS: 75.0000 mg | ORAL_TABLET | Freq: Every evening | ORAL | Status: DC | PRN

## 2015-06-24 NOTE — Progress Notes (Signed)
Subjective:    Patient ID: [name], female    DOB: Jun 28, 1929, 81 y.o.   MRN: [id number]  Hypertension This is a new problem. The problem has been gradually worsening since onset. The problem is uncontrolled. Associated symptoms include blurred vision, malaise/fatigue and sweats. Pertinent negatives include no anxiety, chest pain, headaches, neck pain, orthopnea, palpitations, peripheral edema or shortness of breath. Treatments tried: Hyzaar 50-12.5 mg.  Insomnia Primary symptoms: sleep disturbance, difficulty falling asleep, frequent awakening (2-3 times per night), malaise/fatigue.  The problem occurs nightly. The problem is unchanged. How many beverages per day that contain caffeine: 2-3.  Types of beverages you drink: coffee and soda. Past treatments include medication. The treatment provided moderate relief. Typical bedtime:  10-11 P.M..  How long after going to bed to you fall asleep: 30-60 minutes.    Pt is here to talk about tapering Xanax. Pt currently takes 0.5 mg qhs, and is considering tapering it to Monday, Wednesday, Friday, and Sunday nights.  Back pain is getting better.   Review of Systems  Constitutional: Positive for malaise/fatigue.  Eyes: Positive for blurred vision.  Respiratory: Negative for shortness of breath.   Cardiovascular: Negative for chest pain, palpitations and orthopnea.  Musculoskeletal: Negative for neck pain.  Neurological: Negative for headaches.  Psychiatric/Behavioral: Positive for sleep disturbance. The patient has insomnia.    BP 168/72 mmHg  Pulse 76  Temp(Src) 98.3 F (36.8 C) (Oral)  Resp 16  Wt 158 lb (71.668 kg)   Patient Active Problem List   Diagnosis Date Noted  . Chronic vulvitis 06/02/2015  . Allergic rhinitis 05/30/2015  . Absolute anemia 05/30/2015  . Peripheral vascular disease with pain at rest 05/30/2015  . Back ache 05/30/2015  . Body mass index (BMI) of 29.0-29.9 in adult 05/30/2015  . Difficult or painful  urination 05/30/2015  . Edema extremities 05/30/2015  . Dilatation of esophagus 05/30/2015  . Fall 05/30/2015  . H/O deep venous thrombosis 05/30/2015  . Hypercholesteremia 05/30/2015  . Heart & renal disease, hypertensive, with heart failure 05/30/2015  . Below normal amount of sodium in the blood 05/30/2015  . Calculus of kidney 05/30/2015  . Gonalgia 05/30/2015  . Itch of skin 05/30/2015  . Psoriasis 05/30/2015  . Unsteadiness 05/30/2015  . FOM (frequency of micturition) 05/30/2015  . Avitaminosis D 05/30/2015  . Insomnia 04/01/2015  . Cough 04/01/2015  . Combined fat and carbohydrate induced hyperlipemia 12/30/2014  . AF (paroxysmal atrial fibrillation) 07/10/2014  . Abnormal gait 07/08/2014  . Anxiety state 07/08/2014  . Blood in feces 07/08/2014  . CAFL (chronic airflow limitation) 07/08/2014  . Chronic kidney disease 07/08/2014  . Acid reflux 07/08/2014  . Cutaneous malignant melanoma 07/08/2014  . H/O thrombosis 07/08/2014  . Diabetes mellitus, type 2 07/08/2014  . Benign essential HTN 06/26/2014  . Carotid artery narrowing 06/26/2014  . Chest pain 06/26/2014  . Basal cell carcinoma of face 05/01/2014  . Disease of female genital organs 11/28/2012  . Incomplete bladder emptying 09/05/2012  . Excessive urination at night 08/15/2012  . Basal cell carcinoma of ear 10/26/2011   Past Medical History  Diagnosis Date  . Vulvovaginitis   . Chronic back pain   . Hypertension   . DDD (degenerative disc disease), lumbar   . Diabetes mellitus without complication   . Atrial fibrillation   . Hyperlipemia   . Angina pectoris   . Cervicalgia   . Dysphagia    Current Outpatient Prescriptions on File Prior to Visit  Medication Sig  . ALPRAZolam (XANAX) 0.5 MG tablet at bedtime.   Marland Kitchen aspirin 325 MG tablet Take 325 mg by mouth daily.   . benzonatate (TESSALON) 100 MG capsule TAKE 1 TO 2 CAPSULES BY MOUTH 3 TIMES DAILY AS NEEDED  . clobetasol ointment (TEMOVATE) 0.05 % Apply  topically to the affected area twice a day for 2 weeks, then once a day for 2 weeks.  Marland Kitchen diltiazem (CARDIZEM CD) 180 MG 24 hr capsule TAKE 1 CAP EVERY DAY  . FLUoxetine (PROZAC) 20 MG capsule Take 20 mg by mouth daily.   Marland Kitchen glucose blood test strip   . Insulin Glargine (LANTUS SOLOSTAR) 100 UNIT/ML Solostar Pen INJECT 15 UNITS SUBQ AT BEDTIME  . insulin lispro (HUMALOG KWIKPEN) 100 UNIT/ML KiwkPen Inject into the skin.  Marland Kitchen losartan-hydrochlorothiazide (HYZAAR) 50-12.5 MG per tablet TAKE ONE TABLET EVERY DAY  . Melatonin 1 MG TABS Take 3 mg by mouth daily.   . pantoprazole (PROTONIX) 40 MG tablet Take by mouth.  . traZODone (DESYREL) 50 MG tablet TAKE ONE TABLET BY MOUTH AT BEDTIME   No current facility-administered medications on file prior to visit.   Allergies  Allergen Reactions  . Neomycin-Bacitracin Zn-Polymyx Swelling  . Ibuprofen     Tachycardia.  . Valdecoxib Nausea And Vomiting  . Albuterol Rash  . Latex Rash  . Tape Rash    blisters  . Triamcinolone Rash   Past Surgical History  Procedure Laterality Date  . Vaginal hysterectomy    . Melanoma      removal neck and back  . Gall bladder    . Ureterolithiasis      calculus removed  . Vulva / perineum biopsy  05/29/2015   Social History   Social History  . Marital Status: Widowed    Spouse Name: N/A  . Number of Children: N/A  . Years of Education: N/A   Occupational History  . Not on file.   Social History Main Topics  . Smoking status: Never Smoker   . Smokeless tobacco: Never Used  . Alcohol Use: No  . Drug Use: No  . Sexual Activity: No   Other Topics Concern  . Not on file   Social History Narrative   Family History  Problem Relation Age of Onset  . Diabetes Sister   . Colon cancer Brother   . Diabetes Brother   . Breast cancer Neg Hx   . Ovarian cancer Other       Objective:   Physical Exam  Constitutional: She is oriented to person, place, and time. She appears well-developed and  well-nourished.  Neurological: She is alert and oriented to person, place, and time.  Psychiatric: She has a normal mood and affect. Her behavior is normal. Judgment and thought content normal.   BP 168/72 mmHg  Pulse 76  Temp(Src) 98.3 F (36.8 C) (Oral)  Resp 16  Wt 158 lb (71.668 kg)      Assessment & Plan:  1. Benign essential HTN Stable. Continue current medication for now. May need increase if does not improve with treating sleep and mood.    2. Type 2 diabetes mellitus without complication Having lows. Stressed importance of monitoring this and figuring out how to prevent further lows.    3. Anxiety state Worsening. Will increase medication and recheck in 4 weeks.   - FLUoxetine (PROZAC) 20 MG capsule; Take 2 capsules (40 mg total) by mouth daily. 40 mg every day except 60 mg Monday, Wednesday and Friday.  Dispense: 90 capsule; Refill: 3  4. Insomnia Will increase Trazodone, noise machine for ringing in her ears and stop Melatonin and taper off Xanax. Reviewed changes with patient and daughter who fills her pill box.  - traZODone (DESYREL) 50 MG tablet; Take 1.5 tablets (75 mg total) by mouth at bedtime as needed for sleep.  Dispense: 45 tablet; Refill: 3  5. Need for influenza vaccination Given today.  - Flu vaccine HIGH DOSE PF  Margarita Rana, MD

## 2015-07-08 ENCOUNTER — Ambulatory Visit: Payer: Commercial Managed Care - HMO | Admitting: Obstetrics and Gynecology

## 2015-07-15 ENCOUNTER — Ambulatory Visit: Payer: Commercial Managed Care - HMO | Admitting: Obstetrics and Gynecology

## 2015-07-17 ENCOUNTER — Telehealth: Payer: Self-pay | Admitting: Family Medicine

## 2015-07-17 MED ORDER — HYDROCODONE-HOMATROPINE 5-1.5 MG/5ML PO SYRP: 5.0000 mL | ORAL_SOLUTION | Freq: Three times a day (TID) | ORAL | Status: DC | PRN

## 2015-07-17 NOTE — Telephone Encounter (Signed)
Pt contacted office for refill request on the following medications: HYDROcodone-homatropine (HYCODAN) 5-1.5 MG/5ML syrup to [facility]. Pt stated that she is coughing all day and night and the light cough pills are not helping. Pt stated if she can not get this medication she would like to get something that will help with her cough. Please advise. Thanks TNP

## 2015-07-17 NOTE — Telephone Encounter (Signed)
Advised patient as below.  

## 2015-07-17 NOTE — Telephone Encounter (Signed)
Printed rx. Make sure patient knows to use sparingly. Thanks.

## 2015-07-23 ENCOUNTER — Ambulatory Visit (INDEPENDENT_AMBULATORY_CARE_PROVIDER_SITE_OTHER): Payer: Commercial Managed Care - HMO | Admitting: Family Medicine

## 2015-07-23 ENCOUNTER — Encounter: Payer: Self-pay | Admitting: Family Medicine

## 2015-07-23 VITALS — BP 138/80 | HR 88 | Temp 98.2°F | Resp 16 | Wt 159.0 lb

## 2015-07-23 DIAGNOSIS — I10 Essential (primary) hypertension: Secondary | ICD-10-CM | POA: Diagnosis not present

## 2015-07-23 DIAGNOSIS — E1169 Type 2 diabetes mellitus with other specified complication: Secondary | ICD-10-CM | POA: Diagnosis not present

## 2015-07-23 DIAGNOSIS — J3089 Other allergic rhinitis: Secondary | ICD-10-CM

## 2015-07-23 DIAGNOSIS — G4700 Insomnia, unspecified: Secondary | ICD-10-CM | POA: Diagnosis not present

## 2015-07-23 DIAGNOSIS — I480 Paroxysmal atrial fibrillation: Secondary | ICD-10-CM

## 2015-07-23 DIAGNOSIS — R519 Headache, unspecified: Secondary | ICD-10-CM

## 2015-07-23 DIAGNOSIS — R05 Cough: Secondary | ICD-10-CM

## 2015-07-23 DIAGNOSIS — F411 Generalized anxiety disorder: Secondary | ICD-10-CM | POA: Diagnosis not present

## 2015-07-23 DIAGNOSIS — R51 Headache: Secondary | ICD-10-CM | POA: Diagnosis not present

## 2015-07-23 DIAGNOSIS — R059 Cough, unspecified: Secondary | ICD-10-CM

## 2015-07-23 NOTE — Progress Notes (Signed)
Patient ID: [name], female   DOB: Feb 18, 1929, 81 y.o.   MRN: [id number]         Patient: [name] Female    DOB: 05-Jul-1929   81 y.o.   MRN: [id number] Visit Date: 07/23/2015  Today's Provider: Margarita Rana, MD   Chief Complaint  Patient presents with  . Anxiety  . Cough  . Depression   Subjective:    Depression      The patient presents with depression.  This is a chronic (Pt's Fluoxetine was increased to 52m on M, W, F at her last office visit 05/2015.  Also increased Trazodone to 731mat bedtime.  ) problem.  The problem occurs constantly.The problem is unchanged.  Associated symptoms include no decreased concentration, no fatigue, does not have insomnia, no appetite change, no headaches, no indigestion and no suicidal ideas.  Compliance with treatment is good.  Previous treatment provided no relief (Pt reports feeling about the same.) relief.  Past medical history includes depression.    (Has not had one in a long time. ) Cough This is a chronic problem. The cough is productive of sputum. Associated symptoms include nasal congestion, postnasal drip and rhinorrhea. Pertinent negatives include no chest pain, chills, ear congestion, ear pain, fever, headaches, sore throat, shortness of breath or wheezing. She has tried prescription cough suppressant for the symptoms. The treatment provided significant (Was taking it two times a day.  ) relief. Her past medical history is significant for COPD. Has not had one in a long time.   Headache  This is a chronic problem. The current episode started more than 1 year ago. The pain is located in the bilateral region. The quality of the pain is described as band-like (Pt reports her head feels "heavy" "like there is a plate in her head".   Can not really explain it.   Funny feeling. Has it all the time. Comes and goes.  Last 30 minutes. ). Associated symptoms include coughing and rhinorrhea. Pertinent negatives include no  abdominal pain, dizziness, ear pain, fever, hearing loss, insomnia, nausea, numbness, scalp tenderness, seizures, sinus pressure, sore throat, tingling, tinnitus or vomiting. Nothing aggravates the symptoms. Her past medical history is significant for migraine headaches. (Has not had one in a long time. )       Allergies  Allergen Reactions  . Neomycin-Bacitracin Zn-Polymyx Swelling  . Ibuprofen     Tachycardia.  . Valdecoxib Nausea And Vomiting  . Albuterol Rash  . Latex Rash  . Tape Rash    blisters  . Triamcinolone Rash   Previous Medications   ACETAMINOPHEN (TYLENOL) 500 MG TABLET    Take 1,000 mg by mouth every 6 (six) hours as needed.   ALPRAZOLAM (XANAX) 0.5 MG TABLET       ASPIRIN 325 MG TABLET    Take 325 mg by mouth daily.    BENZONATATE (TESSALON) 100 MG CAPSULE    TAKE 1 TO 2 CAPSULES BY MOUTH 3 TIMES DAILY AS NEEDED   CLOBETASOL OINTMENT (TEMOVATE) 0.05 %    Apply topically to the affected area twice a day for 2 weeks, then once a day for 2 weeks.   DILTIAZEM (CARDIZEM CD) 180 MG 24 HR CAPSULE    TAKE 1 CAP EVERY DAY   FLUOXETINE (PROZAC) 20 MG CAPSULE    Take 2 capsules (40 mg total) by mouth daily. 40 mg every day except 60 mg Monday, Wednesday and Friday.   GLUCOSE BLOOD TEST STRIP  HYDROCODONE-HOMATROPINE (HYCODAN) 5-1.5 MG/5ML SYRUP    Take 5 mLs by mouth every 8 (eight) hours as needed for cough.   INSULIN GLARGINE (LANTUS SOLOSTAR) 100 UNIT/ML SOLOSTAR PEN    INJECT 15 UNITS SUBQ AT BEDTIME   INSULIN LISPRO (HUMALOG KWIKPEN) 100 UNIT/ML KIWKPEN    Inject into the skin.   LOSARTAN-HYDROCHLOROTHIAZIDE (HYZAAR) 50-12.5 MG PER TABLET    TAKE ONE TABLET EVERY DAY   NAPROXEN SODIUM (ANAPROX) 220 MG TABLET    Take 220 mg by mouth 2 (two) times daily with a meal.   PANTOPRAZOLE (PROTONIX) 40 MG TABLET    Take by mouth.   TRAZODONE (DESYREL) 50 MG TABLET    Take 1.5 tablets (75 mg total) by mouth at bedtime as needed for sleep.   VITAMIN C (ASCORBIC ACID) 500 MG  TABLET    Take 500 mg by mouth daily.    Review of Systems  Constitutional: Negative for fever, chills, diaphoresis, activity change, appetite change, fatigue and unexpected weight change.  HENT: Positive for congestion, postnasal drip and rhinorrhea. Negative for ear discharge, ear pain, hearing loss, nosebleeds, sinus pressure, sneezing, sore throat, tinnitus, trouble swallowing and voice change.   Respiratory: Positive for cough. Negative for apnea, choking, chest tightness, shortness of breath, wheezing and stridor.   Cardiovascular: Positive for palpitations (Chronic issue/followed by the cardiologist.). Negative for chest pain and leg swelling.  Gastrointestinal: Positive for constipation. Negative for nausea, vomiting, abdominal pain, diarrhea, blood in stool, abdominal distention, anal bleeding and rectal pain.  Neurological: Positive for light-headedness. Negative for dizziness, tingling, seizures, numbness and headaches.  Psychiatric/Behavioral: Positive for depression and dysphoric mood. Negative for suicidal ideas, hallucinations, behavioral problems, confusion, sleep disturbance, self-injury, decreased concentration and agitation. The patient is nervous/anxious. The patient does not have insomnia and is not hyperactive.     Social History  Substance Use Topics  . Smoking status: Never Smoker   . Smokeless tobacco: Never Used  . Alcohol Use: No   Objective:   BP 138/80 mmHg  Pulse 88  Temp(Src) 98.2 F (36.8 C) (Oral)  Resp 16  Wt 159 lb (72.122 kg)  Physical Exam  Constitutional: She is oriented to person, place, and time. She appears well-developed and well-nourished.  HENT:  Head: Normocephalic and atraumatic.  Eyes: Pupils are equal, round, and reactive to light.  Neck: Normal range of motion. Neck supple.  Cardiovascular: Normal rate.   Pulmonary/Chest: Effort normal.  Neurological: She is alert and oriented to person, place, and time.  Psychiatric: She has a  normal mood and affect. Her behavior is normal. Judgment and thought content normal.      Assessment & Plan:     1. Anxiety state Stable.  2. AF (paroxysmal atrial fibrillation) (HCC) Stable.  Follow up with cardiology.   3. Other allergic rhinitis Stable.   Continue current medication.   4. Benign essential HTN Stable. Continue current medication.    5. Cough Continue current medication, only at night.  Stressed importance of not taking her medication in the day.  6. Insomnia Stable.   7. Headache, unspecified headache type Will check for temporal arteritis.  Check blood pressure when has a headache.  - Sed Rate (ESR)  8. Type 2 diabetes mellitus with other specified complication ([place]) Follow up with endocrinology and podiatry ( needs to new podiatrist). - Ambulatory referral to [place], MD  [facility]

## 2015-07-24 ENCOUNTER — Telehealth: Payer: Self-pay

## 2015-07-24 LAB — SEDIMENTATION RATE: Sed Rate: 2 mm/hr (ref 0–40)

## 2015-07-24 NOTE — Telephone Encounter (Signed)
Pt advised.   Thanks,   -Laura  

## 2015-07-24 NOTE — Telephone Encounter (Signed)
-----   Message from Margarita Rana, MD sent at 07/24/2015  6:33 AM EDT ----- Sed rate normal. PLease notify patient. Thanks.

## 2015-07-30 ENCOUNTER — Other Ambulatory Visit: Payer: Self-pay | Admitting: Obstetrics and Gynecology

## 2015-08-31 ENCOUNTER — Emergency Department: Payer: Commercial Managed Care - HMO

## 2015-08-31 ENCOUNTER — Emergency Department
Admission: EM | Admit: 2015-08-31 | Discharge: 2015-08-31 | Disposition: A | Discharge status: Home / Self Care | Payer: Commercial Managed Care - HMO | Attending: Emergency Medicine | Admitting: Emergency Medicine

## 2015-08-31 DIAGNOSIS — Z7982 Long term (current) use of aspirin: Secondary | ICD-10-CM | POA: Diagnosis not present

## 2015-08-31 DIAGNOSIS — Z794 Long term (current) use of insulin: Secondary | ICD-10-CM | POA: Insufficient documentation

## 2015-08-31 DIAGNOSIS — N189 Chronic kidney disease, unspecified: Secondary | ICD-10-CM | POA: Diagnosis not present

## 2015-08-31 DIAGNOSIS — Z791 Long term (current) use of non-steroidal anti-inflammatories (NSAID): Secondary | ICD-10-CM | POA: Diagnosis not present

## 2015-08-31 DIAGNOSIS — Z79899 Other long term (current) drug therapy: Secondary | ICD-10-CM | POA: Insufficient documentation

## 2015-08-31 DIAGNOSIS — E119 Type 2 diabetes mellitus without complications: Secondary | ICD-10-CM | POA: Insufficient documentation

## 2015-08-31 DIAGNOSIS — Z7952 Long term (current) use of systemic steroids: Secondary | ICD-10-CM | POA: Diagnosis not present

## 2015-08-31 DIAGNOSIS — I129 Hypertensive chronic kidney disease with stage 1 through stage 4 chronic kidney disease, or unspecified chronic kidney disease: Secondary | ICD-10-CM | POA: Diagnosis not present

## 2015-08-31 DIAGNOSIS — Z91040 Latex allergy status: Secondary | ICD-10-CM | POA: Insufficient documentation

## 2015-08-31 DIAGNOSIS — R51 Headache: Secondary | ICD-10-CM | POA: Diagnosis present

## 2015-08-31 DIAGNOSIS — M542 Cervicalgia: Secondary | ICD-10-CM | POA: Diagnosis not present

## 2015-08-31 LAB — BASIC METABOLIC PANEL
Anion gap: 11 (ref 5–15)
BUN: 18 mg/dL (ref 6–20)
CALCIUM: 9.7 mg/dL (ref 8.9–10.3)
CO2: 29 mmol/L (ref 22–32)
CREATININE: 1.4 mg/dL — AB (ref 0.44–1.00)
Chloride: 95 mmol/L — ABNORMAL LOW (ref 101–111)
GFR calc Af Amer: 39 mL/min — ABNORMAL LOW (ref >60)
GFR calc non Af Amer: 33 mL/min — ABNORMAL LOW (ref >60)
Glucose, Bld: 219 mg/dL — ABNORMAL HIGH (ref 65–99)
POTASSIUM: 3.3 mmol/L — AB (ref 3.5–5.1)
SODIUM: 135 mmol/L (ref 135–145)

## 2015-08-31 LAB — CBC
HCT: 43.9 % (ref 35.0–47.0)
Hemoglobin: 14.6 g/dL (ref 12.0–16.0)
MCH: 29.1 pg (ref 26.0–34.0)
MCHC: 33.3 g/dL (ref 32.0–36.0)
MCV: 87.4 fL (ref 80.0–100.0)
PLATELETS: 266 10*3/uL (ref 150–440)
RBC: 5.02 MIL/uL (ref 3.80–5.20)
RDW: 13.2 % (ref 11.5–14.5)
WBC: 8.7 10*3/uL (ref 3.6–11.0)

## 2015-08-31 LAB — SEDIMENTATION RATE: SED RATE: 22 mm/h (ref 0–30)

## 2015-08-31 LAB — TROPONIN I

## 2015-08-31 MED ORDER — IOHEXOL 300 MG/ML  SOLN
100.0000 mL | Freq: Once | INTRAMUSCULAR | Status: AC | PRN
  Administered 2015-08-31: 50 mL via INTRAVENOUS

## 2015-08-31 MED ORDER — HYDROCODONE-ACETAMINOPHEN 5-325 MG PO TABS
1.0000 | ORAL_TABLET | ORAL | Status: AC
  Administered 2015-08-31: 1 via ORAL
  Filled 2015-08-31: qty 1

## 2015-08-31 MED ORDER — ONDANSETRON HCL 4 MG/2ML IJ SOLN
4.0000 mg | Freq: Once | INTRAMUSCULAR | Status: AC
  Administered 2015-08-31: 4 mg via INTRAVENOUS
  Filled 2015-08-31: qty 2

## 2015-08-31 MED ORDER — DIPHENHYDRAMINE HCL 50 MG/ML IJ SOLN
INTRAMUSCULAR | Status: AC
  Administered 2015-08-31: 25 mg via INTRAVENOUS
  Filled 2015-08-31: qty 1

## 2015-08-31 MED ORDER — MORPHINE SULFATE (PF) 2 MG/ML IV SOLN
2.0000 mg | Freq: Once | INTRAVENOUS | Status: AC
  Administered 2015-08-31: 2 mg via INTRAVENOUS
  Filled 2015-08-31: qty 1

## 2015-08-31 MED ORDER — SODIUM CHLORIDE 0.9 % IV BOLUS (SEPSIS)
500.0000 mL | Freq: Once | INTRAVENOUS | Status: AC
  Administered 2015-08-31: 500 mL via INTRAVENOUS

## 2015-08-31 MED ORDER — DIPHENHYDRAMINE HCL 50 MG/ML IJ SOLN
25.0000 mg | Freq: Once | INTRAMUSCULAR | Status: AC
  Administered 2015-08-31: 25 mg via INTRAVENOUS

## 2015-08-31 MED ORDER — HYDROCODONE-ACETAMINOPHEN 5-325 MG PO TABS: 1.0000 | ORAL_TABLET | Freq: Four times a day (QID) | ORAL | Status: DC | PRN

## 2015-08-31 NOTE — ED Provider Notes (Signed)
[facility] Emergency Department Provider Note REMINDER - THIS NOTE IS NOT A FINAL MEDICAL RECORD UNTIL IT IS SIGNED. UNTIL THEN, THE CONTENT BELOW MAY REFLECT INFORMATION FROM A DOCUMENTATION TEMPLATE, NOT THE ACTUAL PATIENT VISIT. ____________________________________________  Time seen: Approximately 8:20 AM  I have reviewed the triage vital signs and the nursing notes.   HISTORY  Chief Complaint Headache    HPI [name] is a 81 y.o. female previous history of angina, atrial fibrillation, diabetes, hypertension.  Patient reports that she woke up with severe pain in the back of the left neck, which shoots up to the area behind her left year as well as down towards her left shoulder. This occurred suddenly at approximately 4:30 AM.  She denies any numbness, tingling, weakness, nausea, vomiting. She reports it doesn't really feel like a "headache", but feels much more like severe pain in the left neck.  No fevers chills or recent illness. She does not take any blood thinners other than aspirin. Family reports that she's been told that she has narrowing of the arteries or veins in the neck before, but vascular surgery advised no surgery at the time.  She denies any weakness in the arms or legs. She does not have facial droop or any trouble speaking. No problems with coordination, or use of the hands.  Severe sharp pain, made better by keeping her neck still. Worsened significantly by turning the head to the left right or up or down. No traumatic injury.  Past Medical History  Diagnosis Date  . Vulvovaginitis   . Chronic back pain   . Hypertension   . DDD (degenerative disc disease), lumbar   . Diabetes mellitus without complication ([place])   . Atrial fibrillation ([place])   . Hyperlipemia   . Angina pectoris ([place])   . Cervicalgia   . Dysphagia     Patient Active Problem List   Diagnosis Date Noted  . Insomnia 07/23/2015  . Headache 07/23/2015   . Chronic vulvitis 06/02/2015  . Allergic rhinitis 05/30/2015  . Absolute anemia 05/30/2015  . Peripheral vascular disease with pain at rest [facility]) 05/30/2015  . Back ache 05/30/2015  . Body mass index (BMI) of 29.0-29.9 in adult 05/30/2015  . Difficult or painful urination 05/30/2015  . Edema extremities 05/30/2015  . Dilatation of esophagus 05/30/2015  . Fall 05/30/2015  . H/O deep venous thrombosis 05/30/2015  . Hypercholesteremia 05/30/2015  . Heart & renal disease, hypertensive, with heart failure ([place]) 05/30/2015  . Below normal amount of sodium in the blood 05/30/2015  . Calculus of kidney 05/30/2015  . Gonalgia 05/30/2015  . Itch of skin 05/30/2015  . Psoriasis 05/30/2015  . Unsteadiness 05/30/2015  . FOM (frequency of micturition) 05/30/2015  . Avitaminosis D 05/30/2015  . Cough 04/01/2015  . Combined fat and carbohydrate induced hyperlipemia 12/30/2014  . AF (paroxysmal atrial fibrillation) ([place]) 07/10/2014  . Paroxysmal atrial fibrillation ([place]) 07/10/2014  . Abnormal gait 07/08/2014  . Anxiety state 07/08/2014  . Blood in feces 07/08/2014  . Chronic kidney disease 07/08/2014  . Acid reflux 07/08/2014  . 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[place]) 07/08/2014  . H/O thrombosis 07/08/2014  . Diabetes mellitus, type 2 ([place]) 07/08/2014  . Malignant melanoma of skin ([place]) 07/08/2014  . Chronic obstructive pulmonary disease ([place]) 07/08/2014  . Benign essential HTN 06/26/2014  . Carotid artery narrowing 06/26/2014  . Chest pain 06/26/2014  . Intermittent claudication ([place]) 06/26/2014  . Basal cell carcinoma of face 05/01/2014  . Disease of  female genital organs 11/28/2012  . Incomplete bladder emptying 09/05/2012  . Excessive urination at night 08/15/2012  . Basal cell carcinoma of ear 10/26/2011    Past Surgical History  Procedure Laterality Date  . Vaginal hysterectomy    . Melanoma      removal neck and back  . Gall bladder    . Ureterolithiasis      calculus  removed  . Vulva / perineum biopsy  05/29/2015    Current Outpatient Rx  Name  Route  Sig  Dispense  Refill  . acetaminophen (TYLENOL) 500 MG tablet   Oral   Take 1,000 mg by mouth every 6 (six) hours as needed.         . ALPRAZolam (XANAX) 0.5 MG tablet               . aspirin 325 MG tablet   Oral   Take 325 mg by mouth daily.          . benzonatate (TESSALON) 100 MG capsule      TAKE 1 TO 2 CAPSULES BY MOUTH 3 TIMES DAILY AS NEEDED   90 capsule   3     FOR NEXT FILL. THANK YOU   . clobetasol ointment (TEMOVATE) 0.05 %      Apply topically to the affected area twice a day for 2 weeks, then once a day for 2 weeks.   30 g   2   . diltiazem (CARDIZEM CD) 180 MG 24 hr capsule      TAKE 1 CAP EVERY DAY         . FLUoxetine (PROZAC) 20 MG capsule   Oral   Take 2 capsules (40 mg total) by mouth daily. 40 mg every day except 60 mg Monday, Wednesday and Friday.   90 capsule   3   . glucose blood test strip               . HYDROcodone-acetaminophen (NORCO/VICODIN) 5-325 MG tablet   Oral   Take 1 tablet by mouth every 6 (six) hours as needed for moderate pain.   15 tablet   0   . HYDROcodone-homatropine (HYCODAN) 5-1.5 MG/5ML syrup   Oral   Take 5 mLs by mouth every 8 (eight) hours as needed for cough.   120 mL   0   . Insulin Glargine (LANTUS SOLOSTAR) 100 UNIT/ML Solostar Pen      INJECT 15 UNITS SUBQ AT BEDTIME         . insulin lispro (HUMALOG KWIKPEN) 100 UNIT/ML KiwkPen   Subcutaneous   Inject into the skin.         Marland Kitchen losartan-hydrochlorothiazide (HYZAAR) 50-12.5 MG per tablet      TAKE ONE TABLET EVERY DAY   30 tablet   5   . naproxen sodium (ANAPROX) 220 MG tablet   Oral   Take 220 mg by mouth 2 (two) times daily with a meal.         . nystatin-triamcinolone (MYCOLOG II) cream      APPLY TWICE A DAY   30 g   4   . pantoprazole (PROTONIX) 40 MG tablet   Oral   Take by mouth.         . traZODone (DESYREL) 50 MG  tablet   Oral   Take 1.5 tablets (75 mg total) by mouth at bedtime as needed for sleep.   45 tablet   3   . vitamin C (ASCORBIC ACID) 500  MG tablet   Oral   Take 500 mg by mouth daily.           Allergies Neomycin-bacitracin zn-polymyx; Ibuprofen; Valdecoxib; Albuterol; Latex; Tape; and Triamcinolone  Family History  Problem Relation Age of Onset  . Diabetes Sister   . Colon cancer Brother   . Diabetes Brother   . Breast cancer Neg Hx   . Ovarian cancer Other     Social History Social History  Substance Use Topics  . Smoking status: Never Smoker   . Smokeless tobacco: Never Used  . Alcohol Use: No    Review of Systems Constitutional: No fever/chills Eyes: No visual changes. ENT: No sore throat. No trouble breathing. No trouble swallowing. Cardiovascular: Denies chest pain. Respiratory: Denies shortness of breath. Gastrointestinal: No abdominal pain.  No nausea, no vomiting.  No diarrhea.  No constipation. Genitourinary: Negative for dysuria. Musculoskeletal: Negative for back pain. Skin: Negative for rash. Neurological: Negative for focal weakness or numbness.  10-point ROS otherwise negative.  ____________________________________________   PHYSICAL EXAM:  VITAL SIGNS: ED Triage Vitals  Enc Vitals Group     BP 08/31/15 0736 196/93 mmHg     Pulse Rate 08/31/15 0736 112     Resp 08/31/15 0736 18     Temp 08/31/15 0736 98.4 F (36.9 C)     Temp Source 08/31/15 0736 Oral     SpO2 08/31/15 0736 96 %     Weight 08/31/15 0736 152 lb (68.947 kg)     Height 08/31/15 0736 5\' 2"  (1.575 m)     Head Cir --      Peak Flow --      Pain Score 08/31/15 0736 9     Pain Loc --      Pain Edu? --      Excl. in [place]? --    Constitutional: Alert and oriented. Well appearing and in no acute distress. Eyes: Conjunctivae are normal. PERRL. EOMI. Head: Atraumatic. Nose: No congestion/rhinnorhea. Mouth/Throat: Mucous membranes are moist.  Oropharynx  non-erythematous. Neck: No stridor.  No cervical spine tenderness. The patient is unable to range the neck due to severe pain with attempts to turn left or right. Patient has focal and very notable pain to palpation at the base of the left occiput, reproduces pain that shoots down her left neck and up towards her left ear. Temporal arteries normal pulsations bilaterally, no temporal artery tenderness. Cardiovascular: Normal rate, regular rhythm. Grossly normal heart sounds.  Good peripheral circulation. Respiratory: Normal respiratory effort.  No retractions. Lungs CTAB. Gastrointestinal: Soft and nontender. No distention. No abdominal bruits. No CVA tenderness. Musculoskeletal: No lower extremity tenderness nor edema.  No joint effusions. Neurologic:  Normal speech and language. No gross focal neurologic deficits are appreciated.   NIH score equals 0, performed by me at bedside. The patient has no pronator drift. The patient has normal cranial nerve exam. Extraocular movements are normal. Visual fields are normal. Patient has 5 out of 5 strength in all extremities. Some limitation of use of the left upper arm as it refers severe pain to her left posterior neck when she attempts to raise the left arm. There is no numbness or gross, acute sensory abnormality in the extremities bilaterally. No speech disturbance. No dysarthria. No aphasia. No ataxia. Normal finger nose finger bilat. Patient speaking in full and clear sentences.   Skin:  Skin is warm, dry and intact. No rash noted. Psychiatric: Mood and affect are normal. Speech and behavior are normal.  ____________________________________________   LABS (all labs ordered are listed, but only abnormal results are displayed)  Labs Reviewed  BASIC METABOLIC PANEL - Abnormal; Notable for the following:    Potassium 3.3 (*)    Chloride 95 (*)    Glucose, Bld 219 (*)    Creatinine, Ser 1.40 (*)    GFR calc non Af Amer 33 (*)    GFR  calc Af Amer 39 (*)    All other components within normal limits  CBC  SEDIMENTATION RATE  TROPONIN I   ____________________________________________  EKG  Reviewed and interpreted by me at 745 Ventricular rate 110 PR 180 QRS 80 QTc 460 Sinus tachycardia, artifact is noted. No ischemic abnormalities noted. ____________________________________________  RADIOLOGY  CT Angio Head W/Cm &/Or Wo Cm (Final result) Result time: 08/31/15 10:25:42   Final result by Rad Results In Interface (08/31/15 10:25:42)   Narrative:   CLINICAL DATA: Patient awoke with severe LEFT-sided neck pain. No neurologic deficit reported. History of diabetes, hypertension, and atrial fibrillation.  EXAM: CT ANGIOGRAPHY HEAD AND NECK  TECHNIQUE: Multidetector CT imaging of the head and neck was performed using the standard protocol during bolus administration of intravenous contrast. Multiplanar CT image reconstructions and MIPs were obtained to evaluate the vascular anatomy. Carotid stenosis measurements (when applicable) are obtained utilizing NASCET criteria, using the distal internal carotid diameter as the denominator.  CONTRAST: 38mL OMNIPAQUE IOHEXOL 300 MG/ML SOLN  COMPARISON: None.  FINDINGS: CT HEAD  Calvarium and skull base: No fracture or destructive lesion. Mastoids and middle ears are grossly clear.  Paranasal sinuses: Imaged portions are clear.  Orbits: No acute findings. BILATERAL cataract extraction.  Brain: No evidence of acute abnormality, including acute infarct, hemorrhage, hydrocephalus, or mass lesion. Normal for age cerebral volume. No white matter disease. Remote lacunar infarct affects the LEFT midbrain in the region of the cerebral peduncle.  CTA NECK  Aortic arch: Standard branching. Imaged portion shows no evidence of aneurysm or dissection. No significant stenosis of the major arch vessel origins. Heavily calcified transverse arch and great  vessel origins, particular RIGHT subclavian, without flow-limiting stenosis.  Right carotid system: Heavily calcified plaque at the bifurcation without flow reducing lesion. No evidence of dissection, stenosis (50% or greater) or occlusion.  Left carotid system: Heavily calcified plaque at the bifurcation without flow reducing lesion. No evidence of dissection, stenosis (50% or greater) or occlusion.  Vertebral arteries: LEFT vertebral dominant. No evidence of dissection, stenosis (50% or greater) or occlusion.  Nonvascular soft tissues: Multilevel spondylosis. Severe disc space narrowing at C5-C6. Degenerative anterolisthesis at C3-4 and C4-5. Mild pannus. Multilevel facet arthropathy. No lung apex lesion or pneumothorax. No neck masses. Poor dentition.  CTA HEAD  Anterior circulation: Non stenotic calcification in both cavernous segments is a common finding in this age group. No significant stenosis, proximal occlusion, aneurysm, or vascular malformation.  Posterior circulation: LEFT vertebral dominant, but both contribute to basilar formation. No significant stenosis, proximal occlusion, aneurysm, or vascular malformation.  Venous sinuses: As permitted by contrast timing, patent.  Anatomic variants: None of significance.  Delayed phase:  No abnormal intracranial enhancement.  IMPRESSION: No evidence for carotid dissection or acute intracranial findings.  Widespread cervical spondylosis, with multilevel degenerative disc disease and facet arthropathy.   Electronically Signed By: Staci Righter M.D. On: 08/31/2015 10:25          CT Angio Neck W/Cm &/Or Wo/Cm (Final result) Result time: 08/31/15 10:25:42   Final result by Rad Results In Interface (08/31/15  10:25:42)   Narrative:   CLINICAL DATA: Patient awoke with severe LEFT-sided neck pain. No neurologic deficit reported. History of diabetes, hypertension, and atrial fibrillation.  EXAM: CT  ANGIOGRAPHY HEAD AND NECK  TECHNIQUE: Multidetector CT imaging of the head and neck was performed using the standard protocol during bolus administration of intravenous contrast. Multiplanar CT image reconstructions and MIPs were obtained to evaluate the vascular anatomy. Carotid stenosis measurements (when applicable) are obtained utilizing NASCET criteria, using the distal internal carotid diameter as the denominator.  CONTRAST: 32mL OMNIPAQUE IOHEXOL 300 MG/ML SOLN  COMPARISON: None.  FINDINGS: CT HEAD  Calvarium and skull base: No fracture or destructive lesion. Mastoids and middle ears are grossly clear.  Paranasal sinuses: Imaged portions are clear.  Orbits: No acute findings. BILATERAL cataract extraction.  Brain: No evidence of acute abnormality, including acute infarct, hemorrhage, hydrocephalus, or mass lesion. Normal for age cerebral volume. No white matter disease. Remote lacunar infarct affects the LEFT midbrain in the region of the cerebral peduncle.  CTA NECK  Aortic arch: Standard branching. Imaged portion shows no evidence of aneurysm or dissection. No significant stenosis of the major arch vessel origins. Heavily calcified transverse arch and great vessel origins, particular RIGHT subclavian, without flow-limiting stenosis.  Right carotid system: Heavily calcified plaque at the bifurcation without flow reducing lesion. No evidence of dissection, stenosis (50% or greater) or occlusion.  Left carotid system: Heavily calcified plaque at the bifurcation without flow reducing lesion. No evidence of dissection, stenosis (50% or greater) or occlusion.  Vertebral arteries: LEFT vertebral dominant. No evidence of dissection, stenosis (50% or greater) or occlusion.  Nonvascular soft tissues: Multilevel spondylosis. Severe disc space narrowing at C5-C6. Degenerative anterolisthesis at C3-4 and C4-5. Mild pannus. Multilevel facet arthropathy. No lung apex  lesion or pneumothorax. No neck masses. Poor dentition.  CTA HEAD  Anterior circulation: Non stenotic calcification in both cavernous segments is a common finding in this age group. No significant stenosis, proximal occlusion, aneurysm, or vascular malformation.  Posterior circulation: LEFT vertebral dominant, but both contribute to basilar formation. No significant stenosis, proximal occlusion, aneurysm, or vascular malformation.  Venous sinuses: As permitted by contrast timing, patent.  Anatomic variants: None of significance.  Delayed phase:  No abnormal intracranial enhancement.  IMPRESSION: No evidence for carotid dissection or acute intracranial findings.  Widespread cervical spondylosis, with multilevel degenerative disc disease and facet arthropathy.   Electronically Signed By: Staci Righter M.D. On: 08/31/2015 10:25    ____________________________________________   PROCEDURES  Procedure(s) performed: None  Critical Care performed: No  ____________________________________________   INITIAL IMPRESSION / ASSESSMENT AND PLAN / ED COURSE  Pertinent labs & imaging results that were available during my care of the patient were reviewed by me and considered in my medical decision making (see chart for details).  Severe pain seemingly localizing to the left posterior neck. Appears to have a significant musculoskeletal component, and I suspect this may represent cervical strain or potential tear of the insertion of the deltoid. Pain is notably very focal over the posterior triangle of the neck. Based on the patient's age, significant hypertension insert (for which she has not taken her blood pressure medicine today), we will obtain CT imaging to rule out dissection or possible aneurysm, though I find these to be far less likely than musculoskeletal.  No evidence of acute cardiac, denies chest pain or trouble  breathing.  ----------------------------------------- 10:41 AM on 08/31/2015 -----------------------------------------  Patient reports feeling much improved. CT and labs very reassuring. She  does appear to have chronic renal insufficiency, have hydrated her with normal saline given the need for angiography to rule out carotid dissection.  And discussed with the patient, at this point I feel she likely has musculoskeletal pain, no evidence of acute cardiac abnormality. Awake alert with no pulmonary or cardiac complaints. We'll discharge her home with prescription for hydrocodone, close follow-up is advised. Also reviewed close return precautions. She'll follow closely with her primary care doctor or return to emergency room right away if any new or concerning symptoms.  I will prescribe the patient a narcotic pain medicine due to their condition which I anticipate will cause at least moderate pain short term. I discussed with the patient safe use of narcotic pain medicines, and that they are not to drive, work in dangerous areas, or ever take more than prescribed (no more than 1 pill every 6 hours). We discussed that this is the type of medication that "Alfonse Spruce" may have overdosed on and the risks of this type of medicine. Patient is very agreeable to only use as prescribed and to never use more than prescribed.   ____________________________________________   FINAL CLINICAL IMPRESSION(S) / ED DIAGNOSES  Final diagnoses:  Neck pain on left side      Delman Kitten, MD 08/31/15 1044

## 2015-08-31 NOTE — ED Notes (Signed)
Pt noted to have mild itching and redness under and around tegaderm over IV site. Pt states she is allergic to several types of tape/adhesive. MD notified, orders received for 25mg  IV Benadryl.

## 2015-08-31 NOTE — ED Notes (Signed)
Patient transported to CT 

## 2015-08-31 NOTE — ED Notes (Signed)
Pt states relief of itching

## 2015-08-31 NOTE — ED Notes (Signed)
AAOx3.  Skin warm and dry.  Moving all extremities equally and strong.  NAD.  D/C home with  daughter

## 2015-08-31 NOTE — ED Notes (Signed)
Pt states she awoke out of sleep with left sided pain that began in head and radiates down to left shoulder. Pt holding left side of neck at time of triage. Denies weakness or numbness. Pt alert and oriented X4, active, cooperative, pt in NAD. RR even and unlabored, color WNL.

## 2015-09-03 ENCOUNTER — Encounter: Payer: Self-pay | Admitting: Family Medicine

## 2015-09-03 ENCOUNTER — Ambulatory Visit (INDEPENDENT_AMBULATORY_CARE_PROVIDER_SITE_OTHER): Payer: Commercial Managed Care - HMO | Admitting: Family Medicine

## 2015-09-03 VITALS — BP 120/60 | HR 80 | Temp 98.2°F | Resp 16 | Wt 160.0 lb

## 2015-09-03 DIAGNOSIS — R05 Cough: Secondary | ICD-10-CM

## 2015-09-03 DIAGNOSIS — E876 Hypokalemia: Secondary | ICD-10-CM

## 2015-09-03 DIAGNOSIS — M436 Torticollis: Secondary | ICD-10-CM

## 2015-09-03 DIAGNOSIS — R059 Cough, unspecified: Secondary | ICD-10-CM

## 2015-09-03 MED ORDER — HYDROCODONE-ACETAMINOPHEN 5-325 MG PO TABS: 1.0000 | ORAL_TABLET | Freq: Four times a day (QID) | ORAL | Status: DC | PRN

## 2015-09-03 MED ORDER — BENZONATATE 100 MG PO CAPS: 100.0000 mg | ORAL_CAPSULE | Freq: Three times a day (TID) | ORAL | Status: DC | PRN

## 2015-09-03 NOTE — Progress Notes (Signed)
Subjective:    Patient ID: [name], female    DOB: 1929-03-03, 81 y.o.   MRN: [id number]  HPI  Follow up Hospitalization  Patient was admitted to [facility] on 08/31/2015 and discharged on same day. She was treated for left sided neck pain.  Was radiating down neck and side.   Concerned at the time about a stroke. Morphine helped her pain in ER. Blood pressure was up.   No weakness.  Still sore.   Treatment for this included CT (showed cervical spondylosis with multilevel DDD and facet arthropathy),   and gave her rx for hydrocodone. She reports good compliance with treatment.  She reports this condition is Improved, but not to goal.Really still affecting her ADLs and quality of life.   Hydrocodone really helped.  Muscles still spasms.  Tender to touch still in her left neck.  Can not turn head to left without pain.    Hospital record were reviewed and also showed low potassium. Unclear if addressed. Will need to be rechecked.    ------------------------------------------------------------------------------------ Patient Active Problem List   Diagnosis Date Noted  . Insomnia 07/23/2015  . Headache 07/23/2015  . Chronic vulvitis 06/02/2015  . Allergic rhinitis 05/30/2015  . Absolute anemia 05/30/2015  . Peripheral vascular disease with pain at rest [facility]) 05/30/2015  . Back ache 05/30/2015  . Body mass index (BMI) of 29.0-29.9 in adult 05/30/2015  . Difficult or painful urination 05/30/2015  . Edema extremities 05/30/2015  . Dilatation of esophagus 05/30/2015  . Fall 05/30/2015  . H/O deep venous thrombosis 05/30/2015  . Hypercholesteremia 05/30/2015  . Heart & renal disease, hypertensive, with heart failure ([place]) 05/30/2015  . Below normal amount of sodium in the blood 05/30/2015  . Calculus of kidney 05/30/2015  . Gonalgia 05/30/2015  . Itch of skin 05/30/2015  . Psoriasis 05/30/2015  . Unsteadiness 05/30/2015  . FOM (frequency of micturition) 05/30/2015  .  Avitaminosis D 05/30/2015  . Cough 04/01/2015  . Combined fat and carbohydrate induced hyperlipemia 12/30/2014  . AF (paroxysmal atrial fibrillation) ([place]) 07/10/2014  . Paroxysmal atrial fibrillation ([place]) 07/10/2014  . Abnormal gait 07/08/2014  . Anxiety state 07/08/2014  . Blood in feces 07/08/2014  . Chronic kidney disease 07/08/2014  . Acid reflux 07/08/2014  . 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[place]) 07/08/2014  . H/O thrombosis 07/08/2014  . Diabetes mellitus, type 2 ([place]) 07/08/2014  . Malignant melanoma of skin ([place]) 07/08/2014  . Chronic obstructive pulmonary disease ([place]) 07/08/2014  . Benign essential HTN 06/26/2014  . Carotid artery narrowing 06/26/2014  . Chest pain 06/26/2014  . Intermittent claudication ([place]) 06/26/2014  . Basal cell carcinoma of face 05/01/2014  . Disease of female genital organs 11/28/2012  . Incomplete bladder emptying 09/05/2012  . Excessive urination at night 08/15/2012  . Basal cell carcinoma of ear 10/26/2011   Past Medical History  Diagnosis Date  . Vulvovaginitis   . Chronic back pain   . Hypertension   . DDD (degenerative disc disease), lumbar   . Diabetes mellitus without complication ([place])   . Atrial fibrillation ([place])   . Hyperlipemia   . Angina pectoris ([place])   . Cervicalgia   . Dysphagia    Current Outpatient Prescriptions on File Prior to Visit  Medication Sig  . acetaminophen (TYLENOL) 500 MG tablet Take 1,000 mg by mouth every 6 (six) hours as needed.  . ALPRAZolam (XANAX) 0.5 MG tablet   . aspirin 325 MG tablet Take 325 mg by mouth daily.   Marland Kitchen  clobetasol ointment (TEMOVATE) 0.05 % Apply topically to the affected area twice a day for 2 weeks, then once a day for 2 weeks.  Marland Kitchen diltiazem (CARDIZEM CD) 180 MG 24 hr capsule TAKE 1 CAP EVERY DAY  . FLUoxetine (PROZAC) 20 MG capsule Take 2 capsules (40 mg total) by mouth daily. 40 mg every day except 60 mg Monday, Wednesday and Friday.  Marland Kitchen glucose blood test strip   .  HYDROcodone-acetaminophen (NORCO/VICODIN) 5-325 MG tablet Take 1 tablet by mouth every 6 (six) hours as needed for moderate pain.  Marland Kitchen HYDROcodone-homatropine (HYCODAN) 5-1.5 MG/5ML syrup Take 5 mLs by mouth every 8 (eight) hours as needed for cough.  . Insulin Glargine (LANTUS SOLOSTAR) 100 UNIT/ML Solostar Pen INJECT 15 UNITS SUBQ AT BEDTIME  . insulin lispro (HUMALOG KWIKPEN) 100 UNIT/ML KiwkPen Inject into the skin.  Marland Kitchen losartan-hydrochlorothiazide (HYZAAR) 50-12.5 MG per tablet TAKE ONE TABLET EVERY DAY  . naproxen sodium (ANAPROX) 220 MG tablet Take 220 mg by mouth 2 (two) times daily with a meal.  . nystatin-triamcinolone (MYCOLOG II) cream APPLY TWICE A DAY  . pantoprazole (PROTONIX) 40 MG tablet Take by mouth.  . traZODone (DESYREL) 50 MG tablet Take 1.5 tablets (75 mg total) by mouth at bedtime as needed for sleep.  . vitamin C (ASCORBIC ACID) 500 MG tablet Take 500 mg by mouth daily.   No current facility-administered medications on file prior to visit.   Allergies  Allergen Reactions  . Neomycin-Bacitracin Zn-Polymyx Swelling  . Ibuprofen     Tachycardia.  . Valdecoxib Nausea And Vomiting  . Albuterol Rash  . Latex Rash  . Tape Rash    blisters  . Triamcinolone Rash   Past Surgical History  Procedure Laterality Date  . Vaginal hysterectomy    . Melanoma      removal neck and back  . Gall bladder    . Ureterolithiasis      calculus removed  . Vulva / perineum biopsy  05/29/2015   Social History   Social History  . Marital Status: Widowed    Spouse Name: N/A  . Number of Children: N/A  . Years of Education: N/A   Occupational History  . Not on file.   Social History Main Topics  . Smoking status: Never Smoker   . Smokeless tobacco: Never Used  . Alcohol Use: No  . Drug Use: No  . Sexual Activity: No   Other Topics Concern  . Not on file   Social History Narrative   Family History  Problem Relation Age of Onset  . Diabetes Sister   . Colon cancer  Brother   . Diabetes Brother   . Breast cancer Neg Hx   . Ovarian cancer Other      Review of Systems  Constitutional: Negative for activity change and appetite change.  HENT: Positive for ear pain. Negative for congestion and facial swelling.   Respiratory: Cough: minimal at this time.    Cardiovascular: Negative for chest pain and palpitations.  Musculoskeletal: Positive for myalgias, neck pain and neck stiffness. Negative for arthralgias.       Objective:   Physical Exam  Constitutional: She appears well-developed and well-nourished.  Neck: Normal range of motion. No thyromegaly present.  Very tender left lateral neck.   Cardiovascular: Normal rate and regular rhythm.   Pulmonary/Chest: Effort normal and breath sounds normal.  Musculoskeletal:  Decreased ROM moving head to left. Slightly decreased ability to raise left arm secondary to pain.  Tender left  neck.    Lymphadenopathy:    She has no cervical adenopathy.   BP 120/60 mmHg  Pulse 80  Temp(Src) 98.2 F (36.8 C) (Oral)  Resp 16  Wt 160 lb (72.576 kg)     Assessment & Plan:  1. Cough Refill tessalon perls for now. Will not fill cough syrup.  - benzonatate (TESSALON) 100 MG capsule; Take 1 capsule (100 mg total) by mouth 3 (three) times daily as needed for cough.  Dispense: 90 capsule; Refill: 3  2. Hypokalemia Will recheck labs. May need treatment. Will also check Magnesium in light of low potassium and muscle spasms.   - Comprehensive metabolic panel - Magnesium  3. Torticollis, acute Very slowly improving. Will continue pain medication as needed. Also, some massage and movement of left arm.  Patient instructed to call back if condition worsens or does not improve.   ( Here with her daughter today).   Margarita Rana, MD  - HYDROcodone-acetaminophen (NORCO/VICODIN) 5-325 MG tablet; Take 1 tablet by mouth every 6 (six) hours as needed for moderate pain.  Dispense: 45 tablet; Refill: 0   Margarita Rana, MD

## 2015-09-05 ENCOUNTER — Telehealth: Payer: Self-pay

## 2015-09-05 LAB — COMPREHENSIVE METABOLIC PANEL
ALBUMIN: 4.1 g/dL (ref 3.5–4.7)
ALT: 13 IU/L (ref 0–32)
AST: 17 IU/L (ref 0–40)
Albumin/Globulin Ratio: 1.7 (ref 1.1–2.5)
Alkaline Phosphatase: 80 IU/L (ref 39–117)
BILIRUBIN TOTAL: 0.6 mg/dL (ref 0.0–1.2)
BUN / CREAT RATIO: 12 (ref 11–26)
BUN: 15 mg/dL (ref 8–27)
CALCIUM: 9.7 mg/dL (ref 8.7–10.3)
CHLORIDE: 91 mmol/L — AB (ref 97–106)
CO2: 28 mmol/L (ref 18–29)
Creatinine, Ser: 1.25 mg/dL — ABNORMAL HIGH (ref 0.57–1.00)
GFR, EST AFRICAN AMERICAN: 46 mL/min/{1.73_m2} — AB (ref >59)
GFR, EST NON AFRICAN AMERICAN: 40 mL/min/{1.73_m2} — AB (ref >59)
GLUCOSE: 238 mg/dL — AB (ref 65–99)
Globulin, Total: 2.4 g/dL (ref 1.5–4.5)
Potassium: 3.9 mmol/L (ref 3.5–5.2)
Sodium: 136 mmol/L (ref 136–144)
TOTAL PROTEIN: 6.5 g/dL (ref 6.0–8.5)

## 2015-09-05 LAB — MAGNESIUM: Magnesium: 1.8 mg/dL (ref 1.6–2.3)

## 2015-09-05 NOTE — Telephone Encounter (Signed)
-----   Message from Margarita Rana, MD sent at 09/05/2015  9:57 AM EST ----- Labs stable. Please notify patient. Thanks.

## 2015-09-05 NOTE — Telephone Encounter (Signed)
Pt advised.   Thanks,   -Onell Mcmath  

## 2015-09-24 ENCOUNTER — Other Ambulatory Visit: Payer: Self-pay

## 2015-09-24 DIAGNOSIS — K219 Gastro-esophageal reflux disease without esophagitis: Secondary | ICD-10-CM

## 2015-09-24 MED ORDER — PANTOPRAZOLE SODIUM 40 MG PO TBEC: 40.0000 mg | DELAYED_RELEASE_TABLET | Freq: Every day | ORAL | Status: DC

## 2015-09-24 NOTE — Telephone Encounter (Signed)
Last ov 09/03/15. Pharmacy is requesting refill. sd

## 2015-10-02 ENCOUNTER — Encounter: Payer: Self-pay | Admitting: Podiatry

## 2015-10-02 ENCOUNTER — Ambulatory Visit (INDEPENDENT_AMBULATORY_CARE_PROVIDER_SITE_OTHER): Payer: Medicare Other | Admitting: Podiatry

## 2015-10-02 DIAGNOSIS — M79676 Pain in unspecified toe(s): Secondary | ICD-10-CM

## 2015-10-02 DIAGNOSIS — B351 Tinea unguium: Secondary | ICD-10-CM

## 2015-10-02 NOTE — Progress Notes (Signed)
   Subjective:    Patient ID: [name], female    DOB: 1928-11-08, 82 y.o.   MRN: [id number]  HPI  82 year old female presents the office today for diabetic risk assessment and for painful, elongated, thick toenails for which she cannot trim herself. Denies any surrounding redness or drainage from the nail sites. She denies any claudication symptoms. No tingling or numbness. No history of ulceration. She said her blood sugar is good although unable to recall her last A1c. No other complaints.    Review of Systems  All other systems reviewed and are negative.      Objective:   Physical Exam General: AAO x3, NAD  Dermatological: Nails are hypertrophic, dystrophic, brittle, discolored, elongated 10. No swelling erythema or drainage. Tenderness nails 1-5 bilaterally. There is mild incurvation of the nails as well. There is dry skin the bilateral heels of the small hyperkeratotic lesion the left heel which she does not want trimmed at this time. No surrounding erythema, edema, drainage.  Neurological: Sensation intact via light touch bilateral. Vibratory intact via tuning fork bilateral. Protective threshold with Semmes Wienstein monofilament intact to all pedal sites bilateral. Patellar and Achilles deep tendon reflexes 2+ bilateral. No Babinski or clonus noted bilateral.   Musculoskeletal: Hammertoes present. No pain, crepitus, or limitation noted with foot and ankle range of motion bilateral. Muscular strength 5/5 in all groups tested bilateral.  Gait: Unassisted, Nonantalgic.      Assessment & Plan:   82 year old female with symptomatic onychomycosis  -Treatment options discussed including all alternatives, risks, and complications -Nail sharply debrided 10 without complication/bleeding  -Discussed the importance of daily foot inspection.  -Follow-up in 3 months or sooner if any problems arise. In the meantime, encouraged to call the office with any questions, concerns,  change in symptoms.   Celesta Gentile, DPM

## 2015-10-08 ENCOUNTER — Other Ambulatory Visit: Payer: Self-pay | Admitting: Family Medicine

## 2015-10-08 DIAGNOSIS — F411 Generalized anxiety disorder: Secondary | ICD-10-CM

## 2015-10-08 NOTE — Telephone Encounter (Signed)
Printed, please fax or call in to pharmacy. Thank you.   

## 2015-10-11 DIAGNOSIS — M6281 Muscle weakness (generalized): Secondary | ICD-10-CM | POA: Diagnosis not present

## 2015-10-21 DIAGNOSIS — Z794 Long term (current) use of insulin: Secondary | ICD-10-CM | POA: Diagnosis not present

## 2015-10-21 DIAGNOSIS — N183 Chronic kidney disease, stage 3 (moderate): Secondary | ICD-10-CM | POA: Diagnosis not present

## 2015-10-21 DIAGNOSIS — E1122 Type 2 diabetes mellitus with diabetic chronic kidney disease: Secondary | ICD-10-CM | POA: Diagnosis not present

## 2015-10-28 DIAGNOSIS — N183 Chronic kidney disease, stage 3 (moderate): Secondary | ICD-10-CM | POA: Diagnosis not present

## 2015-10-28 DIAGNOSIS — E1159 Type 2 diabetes mellitus with other circulatory complications: Secondary | ICD-10-CM | POA: Diagnosis not present

## 2015-10-28 DIAGNOSIS — E1122 Type 2 diabetes mellitus with diabetic chronic kidney disease: Secondary | ICD-10-CM | POA: Diagnosis not present

## 2015-10-28 DIAGNOSIS — Z794 Long term (current) use of insulin: Secondary | ICD-10-CM | POA: Diagnosis not present

## 2015-11-19 ENCOUNTER — Other Ambulatory Visit: Payer: Self-pay | Admitting: Family Medicine

## 2015-11-19 DIAGNOSIS — I10 Essential (primary) hypertension: Secondary | ICD-10-CM

## 2015-12-05 ENCOUNTER — Encounter: Payer: Self-pay | Admitting: Family Medicine

## 2015-12-05 ENCOUNTER — Ambulatory Visit (INDEPENDENT_AMBULATORY_CARE_PROVIDER_SITE_OTHER): Payer: Medicare Other | Admitting: Family Medicine

## 2015-12-05 ENCOUNTER — Telehealth: Payer: Self-pay

## 2015-12-05 VITALS — BP 142/60 | HR 80 | Temp 98.1°F | Resp 16 | Wt 153.0 lb

## 2015-12-05 DIAGNOSIS — J3089 Other allergic rhinitis: Secondary | ICD-10-CM

## 2015-12-05 MED ORDER — CETIRIZINE HCL 10 MG PO TABS: 10.0000 mg | ORAL_TABLET | Freq: Every day | ORAL | Status: DC

## 2015-12-05 MED ORDER — MONTELUKAST SODIUM 10 MG PO TABS: 10.0000 mg | ORAL_TABLET | Freq: Every day | ORAL | Status: DC

## 2015-12-05 NOTE — Telephone Encounter (Signed)
Patient reports that she needs something called in to help with her breathing. She reports that she still has a cough and now she is short of breath. I advised patient that she may need to be seen in the office. Patient is requesting an inhaler. However, after reviewing chart, I noticed that she is allergic to Albuterol. Patient doesn't remember having an reaction to and inhaler. Patient reports that she has had cough for several months (since 08/2015) and it still hasn't gone away. She has had associated shortness of breath for the last 3 days.

## 2015-12-05 NOTE — Progress Notes (Signed)
Patient ID: [name], female   DOB: August 30, 1929, 82 y.o.   MRN: [id number]       Patient: [name] Female    DOB: 1929/09/12   82 y.o.   MRN: [id number] Visit Date: 12/07/2015  Today's Provider: Margarita Rana, MD   Chief Complaint  Patient presents with  . Cough   Subjective:    HPI Cough: Patient complains of nonproductive cough.  Symptoms began several years ago.  The cough is non-productive, without wheezing, dyspnea or hemoptysis and is aggravated by nothing Associated symptoms include:shortness of breath. Patient does not have new pets. Patient does not have a history of asthma. Patient does not have a history of environmental allergens. Patient does not have recent travel. Patient does not have a history of smoking. Patient  has previous Chest X-ray. Patient reports that her cough is worse at night. Patient has been taking Tessalon 100 mg 3 times daily. Patient reports mild relief.      Allergies  Allergen Reactions  . Neomycin-Bacitracin Zn-Polymyx Swelling  . Benzalkonium Chloride Itching and Swelling  . Ibuprofen     Other reaction(s): Dizziness Heart fluttering. Tachycardia. Tachycardia.  . Valdecoxib Nausea And Vomiting  . Albuterol Rash  . Latex Rash  . Tape Rash    blisters  . Triamcinolone Rash   Previous Medications   ACETAMINOPHEN (TYLENOL) 500 MG TABLET    Take 1,000 mg by mouth every 6 (six) hours as needed.   ALPRAZOLAM (XANAX) 0.25 MG TABLET    TAKE ONE TABLET BY MOUTH AT BEDTIME AS NEEDED FOR ANXIETY   ASPIRIN 325 MG TABLET    Take 325 mg by mouth daily.    ASSURE COMFORT LANCETS 30G MISC       BENZONATATE (TESSALON) 100 MG CAPSULE    Take 1 capsule (100 mg total) by mouth 3 (three) times daily as needed for cough.   BLOOD GLUCOSE CALIBRATION (ACCU-CHEK SMARTVIEW CONTROL) LIQD       CLOBETASOL OINTMENT (TEMOVATE) 0.05 %    Apply topically to the affected area twice a day for 2 weeks, then once a day for 2 weeks.   DILTIAZEM  (CARDIZEM CD) 180 MG 24 HR CAPSULE    TAKE 1 CAP EVERY DAY   FLUOXETINE (PROZAC) 20 MG CAPSULE    TAKE 40MG  TUE THUR SAT SUN  AND 60MG  MON WED  FRI   GLUCOSE BLOOD TEST STRIP    ACCU-CHEK AVIVA PLUS TEST STRP Use to check sugars 3 times daily.   HYDROCORTISONE 2.5 % CREAM       INSULIN GLARGINE (LANTUS SOLOSTAR) 100 UNIT/ML SOLOSTAR PEN    INJECT 15 UNITS SUBQ AT BEDTIME   INSULIN LISPRO (HUMALOG KWIKPEN) 100 UNIT/ML KIWKPEN    Inject into the skin.   LOSARTAN-HYDROCHLOROTHIAZIDE (HYZAAR) 50-12.5 MG TABLET    TAKE ONE TABLET BY MOUTH EVERY DAY   NAPROXEN SODIUM (ANAPROX) 220 MG TABLET    Take 220 mg by mouth 2 (two) times daily with a meal.   NYSTATIN-TRIAMCINOLONE (MYCOLOG II) CREAM    APPLY TWICE A DAY   PANTOPRAZOLE (PROTONIX) 40 MG TABLET    Take 1 tablet (40 mg total) by mouth daily.   SURE COMFORT PEN NEEDLES 31G X 5 MM MISC       TRAZODONE (DESYREL) 50 MG TABLET    Take 1.5 tablets (75 mg total) by mouth at bedtime as needed for sleep.   VITAMIN C (ASCORBIC ACID) 500 MG TABLET    Take 500  mg by mouth daily.    Review of Systems  Constitutional: Negative.   HENT: Positive for rhinorrhea.   Respiratory: Positive for cough and shortness of breath.     Social History  Substance Use Topics  . Smoking status: Never Smoker   . Smokeless tobacco: Never Used  . Alcohol Use: No   Objective:   BP 142/60 mmHg  Pulse 80  Temp(Src) 98.1 F (36.7 C) (Oral)  Resp 16  Wt 153 lb (69.4 kg)  SpO2 95%  Physical Exam      Assessment & Plan:     1. Other allergic rhinitis Cough is chronic problem. Previously on Hycodan.  Trying to avoid this medication.  Worsening. Patient started on cetirizine 10 mg and montelukast 10 mg as below. Recheck in 4 weeks. May repeat spirometry at that time.  Consider Spiriva.   Patient to bring all of her medication to this visit.   - cetirizine (ZYRTEC) 10 MG tablet; Take 1 tablet (10 mg total) by mouth daily.  Dispense: 30 tablet; Refill: 5 - montelukast  (SINGULAIR) 10 MG tablet; Take 1 tablet (10 mg total) by mouth at bedtime.  Dispense: 30 tablet; Refill: 5     Patient was seen and examined by Jerrell Belfast, MD, and note scribed by Lynford Humphrey, [place].   I have reviewed the document for accuracy and completeness and I agree with above. - Jerrell Belfast, MD   Margarita Rana, MD  [facility]

## 2015-12-05 NOTE — Telephone Encounter (Signed)
Need ov.  Thanks.  

## 2015-12-05 NOTE — Telephone Encounter (Signed)
Advised pt and scheduled appointment for 2:45 this afternoon. Renaldo Fiddler, CMA

## 2015-12-15 ENCOUNTER — Ambulatory Visit (INDEPENDENT_AMBULATORY_CARE_PROVIDER_SITE_OTHER): Payer: Medicare Other | Admitting: Family Medicine

## 2015-12-15 ENCOUNTER — Encounter: Payer: Self-pay | Admitting: Family Medicine

## 2015-12-15 VITALS — BP 118/64 | HR 76 | Temp 97.9°F | Resp 16 | Wt 154.0 lb

## 2015-12-15 DIAGNOSIS — R05 Cough: Secondary | ICD-10-CM | POA: Diagnosis not present

## 2015-12-15 DIAGNOSIS — R053 Chronic cough: Secondary | ICD-10-CM

## 2015-12-15 DIAGNOSIS — J3089 Other allergic rhinitis: Secondary | ICD-10-CM | POA: Diagnosis not present

## 2015-12-15 NOTE — Progress Notes (Signed)
Subjective:    Patient ID: [name], female    DOB: November 10, 1928, 82 y.o.   MRN: [id number]  Cough This is a chronic (FU from 12/05/2015. Pt was started on Zyrtec and Singulair at [facility] for [place].) problem. The problem has been gradually improving. The cough is non-productive. Associated symptoms include myalgias (back pain), nasal congestion, rhinorrhea, shortness of breath ("sometimes") and sweats (with BS lows). Pertinent negatives include no chest pain, chills, ear congestion, ear pain, fever, headaches, heartburn, hemoptysis, postnasal drip, sore throat, weight loss or wheezing. Nothing aggravates the symptoms. Treatments tried: Zyrtec, Singulair, Tessalon Perles. The treatment provided mild relief. Her past medical history is significant for COPD and environmental allergies.      Review of Systems  Constitutional: Negative for fever, chills and weight loss.  HENT: Positive for rhinorrhea. Negative for ear pain, postnasal drip and sore throat.   Respiratory: Positive for cough and shortness of breath ("sometimes"). Negative for hemoptysis and wheezing.   Cardiovascular: Negative for chest pain.  Gastrointestinal: Negative for heartburn.  Musculoskeletal: Positive for myalgias (back pain).  Allergic/Immunologic: Positive for environmental allergies.  Neurological: Negative for headaches.   BP 118/64 mmHg  Pulse 76  Temp(Src) 97.9 F (36.6 C) (Oral)  Resp 16  Wt 154 lb (69.854 kg)   Patient Active Problem List   Diagnosis Date Noted  . Hypokalemia 09/03/2015  . Insomnia 07/23/2015  . Headache 07/23/2015  . Type 2 diabetes mellitus ([place]) 06/24/2015  . Chronic vulvitis 06/02/2015  . Allergic rhinitis 05/30/2015  . Absolute anemia 05/30/2015  . Back ache 05/30/2015  . Body mass index (BMI) of 29.0-29.9 in adult 05/30/2015  . Edema extremities 05/30/2015  . Dilatation of esophagus 05/30/2015  . Fall 05/30/2015  . H/O deep venous thrombosis 05/30/2015  .  Hypercholesteremia 05/30/2015  . Heart & renal disease, hypertensive, with heart failure ([place]) 05/30/2015  . Below normal amount of sodium in the blood 05/30/2015  . Calculus of kidney 05/30/2015  . Gonalgia 05/30/2015  . Psoriasis 05/30/2015  . Avitaminosis D 05/30/2015  . Cough 04/01/2015  . Combined fat and carbohydrate induced hyperlipemia 12/30/2014  . Paroxysmal atrial fibrillation ([place]) 07/10/2014  . Abnormal gait 07/08/2014  . Anxiety state 07/08/2014  . Chronic kidney disease 07/08/2014  . Acid reflux 07/08/2014  . 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[place]) 07/08/2014  . Chronic obstructive pulmonary disease ([place]) 07/08/2014  . Benign essential HTN 06/26/2014  . Carotid artery narrowing 06/26/2014  . Intermittent claudication ([place]) 06/26/2014  . Basal cell carcinoma of face 05/01/2014  . Disease of female genital organs 11/28/2012  . Incomplete bladder emptying 09/05/2012  . Excessive urination at night 08/15/2012  . Basal cell carcinoma of ear 10/26/2011   Past Medical History  Diagnosis Date  . Vulvovaginitis   . Chronic back pain   . Hypertension   . DDD (degenerative disc disease), lumbar   . Diabetes mellitus without complication ([place])   . Atrial fibrillation ([place])   . Hyperlipemia   . Angina pectoris ([place])   . Cervicalgia   . Dysphagia    Current Outpatient Prescriptions on File Prior to Visit  Medication Sig  . acetaminophen (TYLENOL) 500 MG tablet Take 1,000 mg by mouth every 6 (six) hours as needed.  . ALPRAZolam (XANAX) 0.25 MG tablet TAKE ONE TABLET BY MOUTH AT BEDTIME AS NEEDED FOR ANXIETY  . aspirin 325 MG tablet Take 325 mg by mouth daily.   . ASSURE COMFORT LANCETS 30G MISC   . benzonatate (TESSALON) 100  MG capsule Take 1 capsule (100 mg total) by mouth 3 (three) times daily as needed for cough.  . Blood Glucose Calibration (ACCU-CHEK SMARTVIEW CONTROL) LIQD   . cetirizine (ZYRTEC) 10 MG tablet Take 1 tablet (10 mg total) by mouth daily.  . clobetasol  ointment (TEMOVATE) 0.05 % Apply topically to the affected area twice a day for 2 weeks, then once a day for 2 weeks.  Marland Kitchen diltiazem (CARDIZEM CD) 180 MG 24 hr capsule TAKE 1 CAP EVERY DAY  . FLUoxetine (PROZAC) 20 MG capsule TAKE 40MG  TUE THUR SAT SUN  AND 60MG  MON WED  FRI  . glucose blood test strip ACCU-CHEK AVIVA PLUS TEST STRP Use to check sugars 3 times daily.  . hydrocortisone 2.5 % cream   . Insulin Glargine (LANTUS SOLOSTAR) 100 UNIT/ML Solostar Pen INJECT 15 UNITS SUBQ AT BEDTIME  . insulin lispro (HUMALOG KWIKPEN) 100 UNIT/ML KiwkPen Inject into the skin.  Marland Kitchen losartan-hydrochlorothiazide (HYZAAR) 50-12.5 MG tablet TAKE ONE TABLET BY MOUTH EVERY DAY  . montelukast (SINGULAIR) 10 MG tablet Take 1 tablet (10 mg total) by mouth at bedtime.  . naproxen sodium (ANAPROX) 220 MG tablet Take 220 mg by mouth 2 (two) times daily with a meal.  . nystatin-triamcinolone (MYCOLOG II) cream APPLY TWICE A DAY  . pantoprazole (PROTONIX) 40 MG tablet Take 1 tablet (40 mg total) by mouth daily.  . SURE COMFORT PEN NEEDLES 31G X 5 MM MISC   . traZODone (DESYREL) 50 MG tablet Take 1.5 tablets (75 mg total) by mouth at bedtime as needed for sleep.  . vitamin C (ASCORBIC ACID) 500 MG tablet Take 500 mg by mouth daily.   No current facility-administered medications on file prior to visit.   Allergies  Allergen Reactions  . Neomycin-Bacitracin Zn-Polymyx Swelling  . Benzalkonium Chloride Itching and Swelling  . Ibuprofen     Other reaction(s): Dizziness Heart fluttering. Tachycardia. Tachycardia.  . Valdecoxib Nausea And Vomiting  . Albuterol Rash  . Latex Rash  . Tape Rash    blisters  . Triamcinolone Rash   Past Surgical History  Procedure Laterality Date  . Vaginal hysterectomy    . Melanoma      removal neck and back  . Gall bladder    . Ureterolithiasis      calculus removed  . Vulva / perineum biopsy  05/29/2015   Social History   Social History  . Marital Status: Widowed     Spouse Name: N/A  . Number of Children: N/A  . Years of Education: N/A   Occupational History  . Not on file.   Social History Main Topics  . Smoking status: Never Smoker   . Smokeless tobacco: Never Used  . Alcohol Use: No  . Drug Use: No  . Sexual Activity: No   Other Topics Concern  . Not on file   Social History Narrative   Family History  Problem Relation Age of Onset  . Diabetes Sister   . Colon cancer Brother   . Diabetes Brother   . Breast cancer Neg Hx   . Ovarian cancer Other        Objective:   Physical Exam  Constitutional: She appears well-developed and well-nourished.  HENT:  Right Ear: External ear normal.  Left Ear: External ear normal.  Mouth/Throat: Oropharynx is clear and moist.  Turbinates slightly swollen  Neck: No tracheal deviation present. No thyromegaly present.  Cardiovascular: Normal rate and regular rhythm.   Pulmonary/Chest: Effort normal and breath  sounds normal. No respiratory distress.   BP 118/64 mmHg  Pulse 76  Temp(Src) 97.9 F (36.6 C) (Oral)  Resp 16  Wt 154 lb (69.854 kg)     Assessment & Plan:  1. Chronic cough Spirometry WNL. Zyrtec and Singulair only mildly improving sx. Suspect this is related to sinuses. Refer to ENT to evaluate. - Spirometry with Graph - Ambulatory referral to ENT  2. Other allergic rhinitis See plan above.  Patient seen and examined by Jerrell Belfast, MD, and note scribed by Renaldo Fiddler, CMA. I have reviewed the document for accuracy and completeness and I agree with above. Jerrell Belfast, MD   Margarita Rana, MD

## 2015-12-26 DIAGNOSIS — I10 Essential (primary) hypertension: Secondary | ICD-10-CM | POA: Diagnosis not present

## 2015-12-26 DIAGNOSIS — M7989 Other specified soft tissue disorders: Secondary | ICD-10-CM | POA: Diagnosis not present

## 2015-12-26 DIAGNOSIS — M79609 Pain in unspecified limb: Secondary | ICD-10-CM | POA: Diagnosis not present

## 2015-12-30 DIAGNOSIS — I70213 Atherosclerosis of native arteries of extremities with intermittent claudication, bilateral legs: Secondary | ICD-10-CM | POA: Diagnosis not present

## 2015-12-30 DIAGNOSIS — M7989 Other specified soft tissue disorders: Secondary | ICD-10-CM | POA: Diagnosis not present

## 2015-12-30 DIAGNOSIS — I739 Peripheral vascular disease, unspecified: Secondary | ICD-10-CM | POA: Diagnosis not present

## 2016-01-01 ENCOUNTER — Ambulatory Visit: Payer: Medicare Other | Admitting: Podiatry

## 2016-01-05 ENCOUNTER — Encounter: Payer: Self-pay | Admitting: *Deleted

## 2016-01-05 ENCOUNTER — Ambulatory Visit
Admission: RE | Admit: 2016-01-05 | Discharge: 2016-01-05 | Disposition: A | Discharge status: Home / Self Care | Payer: Medicare Other | Source: Ambulatory Visit | Attending: Vascular Surgery | Admitting: Vascular Surgery

## 2016-01-05 ENCOUNTER — Encounter
Admission: RE | Disposition: A | Discharge status: Home / Self Care | Payer: Self-pay | Source: Ambulatory Visit | Attending: Vascular Surgery

## 2016-01-05 DIAGNOSIS — I6529 Occlusion and stenosis of unspecified carotid artery: Secondary | ICD-10-CM | POA: Insufficient documentation

## 2016-01-05 DIAGNOSIS — Z90710 Acquired absence of both cervix and uterus: Secondary | ICD-10-CM | POA: Insufficient documentation

## 2016-01-05 DIAGNOSIS — Z7982 Long term (current) use of aspirin: Secondary | ICD-10-CM | POA: Insufficient documentation

## 2016-01-05 DIAGNOSIS — M79609 Pain in unspecified limb: Secondary | ICD-10-CM | POA: Diagnosis not present

## 2016-01-05 DIAGNOSIS — M79605 Pain in left leg: Secondary | ICD-10-CM | POA: Diagnosis not present

## 2016-01-05 DIAGNOSIS — Z794 Long term (current) use of insulin: Secondary | ICD-10-CM | POA: Diagnosis not present

## 2016-01-05 DIAGNOSIS — Z87891 Personal history of nicotine dependence: Secondary | ICD-10-CM | POA: Insufficient documentation

## 2016-01-05 DIAGNOSIS — Z8249 Family history of ischemic heart disease and other diseases of the circulatory system: Secondary | ICD-10-CM | POA: Diagnosis not present

## 2016-01-05 DIAGNOSIS — E119 Type 2 diabetes mellitus without complications: Secondary | ICD-10-CM | POA: Diagnosis not present

## 2016-01-05 DIAGNOSIS — I70212 Atherosclerosis of native arteries of extremities with intermittent claudication, left leg: Secondary | ICD-10-CM | POA: Diagnosis not present

## 2016-01-05 DIAGNOSIS — I70218 Atherosclerosis of native arteries of extremities with intermittent claudication, other extremity: Secondary | ICD-10-CM | POA: Insufficient documentation

## 2016-01-05 DIAGNOSIS — Z79899 Other long term (current) drug therapy: Secondary | ICD-10-CM | POA: Insufficient documentation

## 2016-01-05 DIAGNOSIS — R0989 Other specified symptoms and signs involving the circulatory and respiratory systems: Secondary | ICD-10-CM | POA: Insufficient documentation

## 2016-01-05 DIAGNOSIS — I739 Peripheral vascular disease, unspecified: Secondary | ICD-10-CM | POA: Insufficient documentation

## 2016-01-05 DIAGNOSIS — I10 Essential (primary) hypertension: Secondary | ICD-10-CM | POA: Diagnosis not present

## 2016-01-05 DIAGNOSIS — M79606 Pain in leg, unspecified: Secondary | ICD-10-CM | POA: Diagnosis not present

## 2016-01-05 DIAGNOSIS — M7989 Other specified soft tissue disorders: Secondary | ICD-10-CM | POA: Insufficient documentation

## 2016-01-05 DIAGNOSIS — Z9049 Acquired absence of other specified parts of digestive tract: Secondary | ICD-10-CM | POA: Diagnosis not present

## 2016-01-05 DIAGNOSIS — Z823 Family history of stroke: Secondary | ICD-10-CM | POA: Diagnosis not present

## 2016-01-05 HISTORY — DX: Chronic obstructive pulmonary disease, unspecified: J44.9

## 2016-01-05 HISTORY — PX: PERIPHERAL VASCULAR CATHETERIZATION: SHX172C

## 2016-01-05 LAB — BASIC METABOLIC PANEL
Anion gap: 9 (ref 5–15)
BUN: 20 mg/dL (ref 6–20)
CO2: 31 mmol/L (ref 22–32)
Calcium: 9.8 mg/dL (ref 8.9–10.3)
Chloride: 95 mmol/L — ABNORMAL LOW (ref 101–111)
Creatinine, Ser: 1.07 mg/dL — ABNORMAL HIGH (ref 0.44–1.00)
GFR calc Af Amer: 54 mL/min — ABNORMAL LOW (ref >60)
GFR calc non Af Amer: 46 mL/min — ABNORMAL LOW (ref >60)
Glucose, Bld: 103 mg/dL — ABNORMAL HIGH (ref 65–99)
Potassium: 3.3 mmol/L — ABNORMAL LOW (ref 3.5–5.1)
Sodium: 135 mmol/L (ref 135–145)

## 2016-01-05 SURGERY — LOWER EXTREMITY ANGIOGRAPHY
Anesthesia: Moderate Sedation | Site: Leg Lower | Laterality: Left

## 2016-01-05 MED ORDER — HYDROMORPHONE HCL 1 MG/ML IJ SOLN: 1.0000 mg | INTRAMUSCULAR | Status: DC | PRN

## 2016-01-05 MED ORDER — FENTANYL CITRATE (PF) 100 MCG/2ML IJ SOLN
INTRAMUSCULAR | Status: DC | PRN
  Administered 2016-01-05: 50 ug via INTRAVENOUS

## 2016-01-05 MED ORDER — SODIUM BICARBONATE 8.4 % IV SOLN
INTRAVENOUS | Status: AC
  Administered 2016-01-05: 15:00:00 via INTRAVENOUS
  Filled 2016-01-05: qty 500

## 2016-01-05 MED ORDER — DEXTROSE 50 % IV SOLN: 0.5000 | Freq: Once | INTRAVENOUS | Status: DC | PRN

## 2016-01-05 MED ORDER — FENTANYL CITRATE (PF) 100 MCG/2ML IJ SOLN
INTRAMUSCULAR | Status: AC
  Filled 2016-01-05: qty 2

## 2016-01-05 MED ORDER — IOPAMIDOL (ISOVUE-300) INJECTION 61%
INTRAVENOUS | Status: DC | PRN
  Administered 2016-01-05: 45 mL via INTRAVENOUS

## 2016-01-05 MED ORDER — SODIUM BICARBONATE BOLUS VIA INFUSION
INTRAVENOUS | Status: DC
  Filled 2016-01-05: qty 1

## 2016-01-05 MED ORDER — DEXTROSE 5 % IV SOLN
1.5000 g | Freq: Once | INTRAVENOUS | Status: AC
  Administered 2016-01-05: 1.5 g via INTRAVENOUS

## 2016-01-05 MED ORDER — MIDAZOLAM HCL 2 MG/2ML IJ SOLN
INTRAMUSCULAR | Status: DC | PRN
  Administered 2016-01-05: 2 mg via INTRAVENOUS

## 2016-01-05 MED ORDER — SODIUM CHLORIDE 0.9 % IV SOLN
INTRAVENOUS | Status: DC
  Administered 2016-01-05: 13:00:00 via INTRAVENOUS

## 2016-01-05 MED ORDER — LIDOCAINE-EPINEPHRINE (PF) 1 %-1:200000 IJ SOLN
INTRAMUSCULAR | Status: AC
  Filled 2016-01-05: qty 30

## 2016-01-05 MED ORDER — HEPARIN (PORCINE) IN NACL 2-0.9 UNIT/ML-% IJ SOLN
INTRAMUSCULAR | Status: AC
  Filled 2016-01-05: qty 1000

## 2016-01-05 MED ORDER — ONDANSETRON HCL 4 MG/2ML IJ SOLN: 4.0000 mg | INTRAMUSCULAR | Status: DC | PRN

## 2016-01-05 MED ORDER — HEPARIN SODIUM (PORCINE) 1000 UNIT/ML IJ SOLN
INTRAMUSCULAR | Status: AC
  Filled 2016-01-05: qty 1

## 2016-01-05 MED ORDER — MIDAZOLAM HCL 5 MG/5ML IJ SOLN
INTRAMUSCULAR | Status: AC
  Filled 2016-01-05: qty 5

## 2016-01-05 SURGICAL SUPPLY — 18 items
BALLN LUTONIX 4X150X130 (BALLOONS) ×4
BALLN LUTONIX 5X150X130 (BALLOONS) ×4
BALLN ULTRVRSE 3X150X150 (BALLOONS) ×4
BALLOON LUTONIX 4X150X130 (BALLOONS) ×2 IMPLANT
BALLOON LUTONIX 5X150X130 (BALLOONS) ×2 IMPLANT
BALLOON ULTRVRSE 3X150X150 (BALLOONS) ×2 IMPLANT
CATH PIG 70CM (CATHETERS) ×4 IMPLANT
CATH VERT 100CM (CATHETERS) ×4 IMPLANT
DEVICE PRESTO INFLATION (MISCELLANEOUS) ×4 IMPLANT
DEVICE STARCLOSE SE CLOSURE (Vascular Products) ×4 IMPLANT
GLIDEWIRE ADV .035X260CM (WIRE) ×4 IMPLANT
PACK ANGIOGRAPHY (CUSTOM PROCEDURE TRAY) ×4 IMPLANT
SHEATH ANL2 6FRX45 HC (SHEATH) ×4 IMPLANT
SHEATH BRITE TIP 5FRX11 (SHEATH) ×4 IMPLANT
SYR MEDRAD MARK V 150ML (SYRINGE) ×4 IMPLANT
TUBING CONTRAST HIGH PRESS 72 (TUBING) ×4 IMPLANT
WIRE G 018X200 V18 (WIRE) ×4 IMPLANT
WIRE J 3MM .035X145CM (WIRE) ×4 IMPLANT

## 2016-01-05 NOTE — Op Note (Signed)
[facility] Percutaneous Study/Intervention Procedural Note   Date of Surgery: 01/05/2016  Surgeon(s):Maeson Purohit   Assistants:none  Pre-operative Diagnosis: PAD with claudication left lower extremity  Post-operative diagnosis: Same  Procedure(s) Performed: 1. Ultrasound guidance for vascular access right femoral artery 2. Catheter placement into left peroneal artery from right femoral approach 3. Aortogram and selective left lower extremity angiogram 4. Percutaneous transluminal angioplasty of left peroneal artery and tibioperoneal trunk with 3 mm diameter angioplasty balloon 5. Percutaneous transluminal angioplasty of left popliteal artery with 4 mm diameter by 15 cm length Lutonix drug-coated angioplasty balloon  6.  Percutaneous transluminal angioplasty of distal superficial femoral artery and proximal popliteal artery with 5 mm diameter by 15 cm length Lutonix drug-coated angioplasty balloon 7. StarClose closure device right femoral artery  EBL: 25 cc  Contrast: 45 cc  Fluoro Time: 3.1 minutes  Moderate Conscious Sedation Time: approximately 40 minutes using 4 mg of Versed and 100 mcg of Fentanyl  Indications: Patient is an 82 y.o.female with pain in the left lower extremity with short distance claudication. Some symptoms were worrisome for ischemic rest pain in the left foot.. The patient has noninvasive study showing SFA and popliteal stenosis with monophasic flow distally. The patient is brought in for angiography for further evaluation and potential treatment. Risks and benefits are discussed and informed consent is obtained  Procedure: The patient was identified and appropriate procedural time out was performed. The patient was then placed supine on the table and prepped and draped in the usual sterile fashion.Moderate conscious sedation was administered  during a face to face encounter with the patient throughout the procedure with my supervision of the RN administering medicines and monitoring the patient's vital signs, pulse oximetry, telemetry and mental status throughout from the start of the procedure until the patient was taken to the recovery room. Ultrasound was used to evaluate the right common femoral artery. It was patent . A digital ultrasound image was acquired. A Seldinger needle was used to access the right common femoral artery under direct ultrasound guidance and a permanent image was performed. A 0.035 J wire was advanced without resistance and a 5Fr sheath was placed. Pigtail catheter was placed into the aorta and an AP aortogram was performed. This demonstrated at least moderate right renal artery stenosis, no left renal artery stenosis, normal aorta and iliac arteries . I then crossed the aortic bifurcation and advanced to the left femoral head. Selective left lower extremity angiogram was then performed. This demonstrated normal common femoral artery and profunda femoris artery. The superficial femoral artery was normal proximally been in the distal segment began to have diffuse disease that continued throughout the popliteal artery with multiple areas of greater than 50% stenosis and some that appeared to be is narrow was 80-85% over about a 20-25 cm segment. The vessel normalized in the distal popliteal artery and proximal tibioperoneal trunk. The distal tibioperoneal trunk just above its bifurcation demonstrated a high-grade stenosis with the peroneal artery is the only runoff distally but over its first 6-8 cm there was almost a string of sausage appearance with multiple high-grade stenoses or occlusions. The anterior tibial artery was diseased proximally and occluded in its proximal to mid segment with no reconstitution distally. The posterior tibial artery was chronically occluded. The patient was systemically heparinized and a 6  [place] [employer] sheath was then placed over the [employer] wire. I then used a Kumpe catheter and the advantage wire to navigate through the SFA and popliteal  stenoses and confirm intraluminal flow in the distal popliteal artery. I then exchanged for a 0.018 wire and advanced through the tibioperoneal trunk and proximal peroneal artery stenoses and occlusions. A 3 mm diameter by 15 cm length angioplasty balloon was then inflated the proximal peroneal artery and tibioperoneal trunk and inflated to 12 atm for 1 minute. To treat the popliteal and distal SFA lesions I used 2 Lutonix drug-coated angioplasty balloons. The more distal was treated with a 4 mm diameter by 15 cm length angioplasty balloon inflated to 10 atm for 1 minute. The more proximal portion was a 5 mm diameter by 15 cm length drug-coated angioplasty balloon inflated to 8 atm for 1 minute. Completion angiogram showed no greater than 20% residual stenosis in the distal SFA, popliteal artery, tibioperoneal trunk, or peroneal artery with brisk continuous flow distally through the peroneal artery. I elected to terminate the procedure. The sheath was removed and StarClose closure device was deployed in the right femoral artery with excellent hemostatic result. The patient was taken to the recovery room in stable condition having tolerated the procedure well.  Findings:  Aortogram: at least moderate right renal artery stenosis, no left renal artery stenosis, normal aorta and iliac arteries Left Lower Extremity: normal common femoral artery and profunda femoris artery. The superficial femoral artery was normal proximally been in the distal segment began to have diffuse disease that continued throughout the popliteal artery with multiple areas of greater than 50% stenosis and some that appeared to be is narrow was 80-85% over about a 20-25 cm segment. The vessel normalized in the distal popliteal artery and proximal  tibioperoneal trunk. The distal tibioperoneal trunk just above its bifurcation demonstrated a high-grade stenosis with the peroneal artery is the only runoff distally but over its first 6-8 cm there was almost a string of sausage appearance with multiple high-grade stenoses or occlusions. The anterior tibial artery was diseased proximally and occluded in its proximal to mid segment with no reconstitution distally. The posterior tibial artery was chronically occluded.   Disposition: Patient was taken to the recovery room in stable condition having tolerated the procedure well.  Complications: None  Tery Hoeger 01/05/2016 3:04 PM

## 2016-01-05 NOTE — H&P (Signed)
  [facility] History & Physical Update  The patient was interviewed and re-examined.  The patient's previous History and Physical has been reviewed and is unchanged.  There is no change in the plan of care. We plan to proceed with the scheduled procedure.  Wilna Pennie, MD  01/05/2016, 2:14 PM

## 2016-01-06 ENCOUNTER — Encounter: Payer: Self-pay | Admitting: Vascular Surgery

## 2016-01-08 DIAGNOSIS — R05 Cough: Secondary | ICD-10-CM | POA: Diagnosis not present

## 2016-01-08 DIAGNOSIS — K219 Gastro-esophageal reflux disease without esophagitis: Secondary | ICD-10-CM | POA: Diagnosis not present

## 2016-01-08 DIAGNOSIS — J301 Allergic rhinitis due to pollen: Secondary | ICD-10-CM | POA: Diagnosis not present

## 2016-01-15 ENCOUNTER — Other Ambulatory Visit: Payer: Self-pay | Admitting: Family Medicine

## 2016-01-15 DIAGNOSIS — G4700 Insomnia, unspecified: Secondary | ICD-10-CM

## 2016-02-02 ENCOUNTER — Encounter: Payer: Medicare Other | Admitting: Family Medicine

## 2016-02-06 DIAGNOSIS — I739 Peripheral vascular disease, unspecified: Secondary | ICD-10-CM | POA: Diagnosis not present

## 2016-02-06 DIAGNOSIS — I10 Essential (primary) hypertension: Secondary | ICD-10-CM | POA: Diagnosis not present

## 2016-02-06 DIAGNOSIS — M7989 Other specified soft tissue disorders: Secondary | ICD-10-CM | POA: Diagnosis not present

## 2016-02-06 DIAGNOSIS — M79609 Pain in unspecified limb: Secondary | ICD-10-CM | POA: Diagnosis not present

## 2016-02-18 DIAGNOSIS — E1122 Type 2 diabetes mellitus with diabetic chronic kidney disease: Secondary | ICD-10-CM | POA: Diagnosis not present

## 2016-02-18 DIAGNOSIS — Z794 Long term (current) use of insulin: Secondary | ICD-10-CM | POA: Diagnosis not present

## 2016-02-18 DIAGNOSIS — N183 Chronic kidney disease, stage 3 (moderate): Secondary | ICD-10-CM | POA: Diagnosis not present

## 2016-02-19 ENCOUNTER — Other Ambulatory Visit: Payer: Self-pay | Admitting: Family Medicine

## 2016-02-19 DIAGNOSIS — I10 Essential (primary) hypertension: Secondary | ICD-10-CM

## 2016-02-25 DIAGNOSIS — E1159 Type 2 diabetes mellitus with other circulatory complications: Secondary | ICD-10-CM | POA: Diagnosis not present

## 2016-02-25 DIAGNOSIS — Z794 Long term (current) use of insulin: Secondary | ICD-10-CM | POA: Diagnosis not present

## 2016-02-25 DIAGNOSIS — E1122 Type 2 diabetes mellitus with diabetic chronic kidney disease: Secondary | ICD-10-CM | POA: Diagnosis not present

## 2016-02-25 DIAGNOSIS — E11649 Type 2 diabetes mellitus with hypoglycemia without coma: Secondary | ICD-10-CM | POA: Diagnosis not present

## 2016-02-25 DIAGNOSIS — N183 Chronic kidney disease, stage 3 (moderate): Secondary | ICD-10-CM | POA: Diagnosis not present

## 2016-02-26 ENCOUNTER — Encounter: Payer: Self-pay | Admitting: Podiatry

## 2016-02-26 ENCOUNTER — Ambulatory Visit (INDEPENDENT_AMBULATORY_CARE_PROVIDER_SITE_OTHER): Payer: Medicare Other | Admitting: Podiatry

## 2016-02-26 DIAGNOSIS — M79676 Pain in unspecified toe(s): Secondary | ICD-10-CM | POA: Diagnosis not present

## 2016-02-26 DIAGNOSIS — B351 Tinea unguium: Secondary | ICD-10-CM | POA: Diagnosis not present

## 2016-02-26 NOTE — Progress Notes (Signed)
Patient ID: [name], female   DOB: Feb 15, 1929, 82 y.o.   MRN: [id number]  Subjective: 82 y.o. returns the office today for painful, elongated, thickened toenails which she cannot trim herself. Denies any redness or drainage around the nails. Denies any acute changes since last appointment and no new complaints today. Denies any systemic complaints such as fevers, chills, nausea, vomiting.   Objective: AAO 3, NAD DP/PT pulses palpable, CRT less than 3 seconds Nails hypertrophic, dystrophic, elongated, brittle, discolored 10. There is tenderness overlying the nails 1-5 bilaterally. There is no surrounding erythema or drainage along the nail sites. No open lesions or pre-ulcerative lesions are identified. No other areas of tenderness bilateral lower extremities. No overlying edema, erythema, increased warmth. No pain with calf compression, swelling, warmth, erythema.  Assessment: Patient presents with symptomatic onychomycosis  Plan: -Treatment options including alternatives, risks, complications were discussed -Nails sharply debrided 10 without complication/bleeding. -Discussed daily foot inspection. If there are any changes, to call the office immediately.  -Follow-up in 3 months or sooner if any problems are to arise. In the meantime, encouraged to call the office with any questions, concerns, changes symptoms.  Celesta Gentile, DPM

## 2016-03-06 ENCOUNTER — Other Ambulatory Visit: Payer: Self-pay | Admitting: Family Medicine

## 2016-03-06 DIAGNOSIS — J449 Chronic obstructive pulmonary disease, unspecified: Secondary | ICD-10-CM

## 2016-03-18 DIAGNOSIS — I8311 Varicose veins of right lower extremity with inflammation: Secondary | ICD-10-CM | POA: Diagnosis not present

## 2016-03-18 DIAGNOSIS — I8312 Varicose veins of left lower extremity with inflammation: Secondary | ICD-10-CM | POA: Diagnosis not present

## 2016-03-18 DIAGNOSIS — L570 Actinic keratosis: Secondary | ICD-10-CM | POA: Diagnosis not present

## 2016-03-18 DIAGNOSIS — R21 Rash and other nonspecific skin eruption: Secondary | ICD-10-CM | POA: Diagnosis not present

## 2016-03-27 ENCOUNTER — Other Ambulatory Visit: Payer: Self-pay | Admitting: Family Medicine

## 2016-03-27 DIAGNOSIS — K219 Gastro-esophageal reflux disease without esophagitis: Secondary | ICD-10-CM

## 2016-03-29 ENCOUNTER — Other Ambulatory Visit: Payer: Self-pay | Admitting: Family Medicine

## 2016-03-29 DIAGNOSIS — I10 Essential (primary) hypertension: Secondary | ICD-10-CM

## 2016-04-22 ENCOUNTER — Other Ambulatory Visit: Payer: Self-pay | Admitting: Family Medicine

## 2016-04-22 DIAGNOSIS — F411 Generalized anxiety disorder: Secondary | ICD-10-CM

## 2016-04-22 DIAGNOSIS — J3089 Other allergic rhinitis: Secondary | ICD-10-CM

## 2016-04-23 MED ORDER — ALPRAZOLAM 0.25 MG PO TABS: ORAL_TABLET | ORAL | 0 refills | Status: DC

## 2016-04-29 ENCOUNTER — Encounter: Payer: Self-pay | Admitting: Family Medicine

## 2016-04-29 ENCOUNTER — Ambulatory Visit
Admission: RE | Admit: 2016-04-29 | Discharge: 2016-04-29 | Disposition: A | Discharge status: Home / Self Care | Payer: Medicare Other | Source: Ambulatory Visit | Attending: Family Medicine | Admitting: Family Medicine

## 2016-04-29 ENCOUNTER — Encounter: Payer: Medicare Other | Admitting: Family Medicine

## 2016-04-29 ENCOUNTER — Ambulatory Visit (INDEPENDENT_AMBULATORY_CARE_PROVIDER_SITE_OTHER): Payer: Medicare Other | Admitting: Family Medicine

## 2016-04-29 VITALS — BP 168/62 | HR 78 | Temp 98.0°F | Resp 18 | Wt 154.0 lb

## 2016-04-29 DIAGNOSIS — R05 Cough: Secondary | ICD-10-CM | POA: Diagnosis not present

## 2016-04-29 DIAGNOSIS — J42 Unspecified chronic bronchitis: Secondary | ICD-10-CM | POA: Diagnosis not present

## 2016-04-29 DIAGNOSIS — R059 Cough, unspecified: Secondary | ICD-10-CM

## 2016-04-29 DIAGNOSIS — I700 Atherosclerosis of aorta: Secondary | ICD-10-CM | POA: Insufficient documentation

## 2016-04-29 DIAGNOSIS — J449 Chronic obstructive pulmonary disease, unspecified: Secondary | ICD-10-CM | POA: Insufficient documentation

## 2016-04-29 MED ORDER — DOXYCYCLINE HYCLATE 100 MG PO TABS
100.0000 mg | ORAL_TABLET | Freq: Two times a day (BID) | ORAL | 0 refills | Status: DC
Start: 2016-04-29 — End: 2016-05-26

## 2016-04-29 NOTE — Progress Notes (Signed)
Subjective:  HPI Pt is here today for a a cough and congestion. She is coughing up yellow sputum and has coughed so that her chest hurts and she feels short of breath. She has congestion in her head also. She has had these symptoms for almost a week. Denies any fever, or chills. She has a history of COPD.    Prior to Admission medications   Medication Sig Start Date End Date Taking? Authorizing Provider  acetaminophen (TYLENOL) 500 MG tablet Take 1,000 mg by mouth every 6 (six) hours as needed.   Yes Historical Provider, MD  ALPRAZolam Duanne Moron) 0.25 MG tablet TAKE ONE TABLET BY MOUTH AT BEDTIME AS NEEDED FOR ANXIETY 04/23/16  Yes Jerrol Banana., MD  aspirin 325 MG tablet Take 325 mg by mouth daily. Reported on 01/05/2016   Yes Historical Provider, MD  ASSURE COMFORT LANCETS 30G [place]  08/01/15  Yes Historical Provider, MD  benzonatate (TESSALON) 100 MG capsule TAKE 1 CAPSULE BY MOUTH 3 TIMES DAILY ASNEEDED FOR COUGH 03/07/16  Yes Margarita Rana, MD  Blood Glucose Calibration (ACCU-CHEK SMARTVIEW CONTROL) LIQD  07/30/15  Yes Historical Provider, MD  cetirizine (ZYRTEC) 10 MG tablet TAKE ONE TABLET BY MOUTH DAILY 04/23/16  Yes Jerrol Banana., MD  clobetasol ointment (TEMOVATE) 0.05 % Apply topically to the affected area twice a day for 2 weeks, then once a day for 2 weeks. 06/11/15  Yes Alanda Slim Defrancesco, MD  diltiazem (CARDIZEM CD) 180 MG 24 hr capsule TAKE 1 CAPSULE EVERY DAY 03/29/16  Yes Jerrol Banana., MD  FLUoxetine (PROZAC) 20 MG capsule TAKE 40MG  TUE THUR SAT SUN  AND 60MG  MON WED  FRI 11/19/15  Yes Margarita Rana, MD  glucose blood test strip ACCU-CHEK AVIVA PLUS TEST STRP Use to check sugars 3 times daily. 10/28/15  Yes Historical Provider, MD  hydrocortisone 2.5 % cream Reported on 01/05/2016 07/02/15  Yes Historical Provider, MD  Insulin Glargine (LANTUS SOLOSTAR) 100 UNIT/ML Solostar Pen INJECT 15 UNITS SUBQ AT BEDTIME 12/27/14  Yes Historical Provider, MD  insulin lispro  (HUMALOG KWIKPEN) 100 UNIT/ML KiwkPen Inject into the skin.   Yes Historical Provider, MD  losartan-hydrochlorothiazide (HYZAAR) 50-12.5 MG tablet TAKE ONE TABLET EVERY DAY 02/20/16  Yes Margarita Rana, MD  montelukast (SINGULAIR) 10 MG tablet TAKE ONE TABLET BY MOUTH AT BEDTIME 04/23/16  Yes Richard Maceo Pro., MD  naproxen sodium (ANAPROX) 220 MG tablet Take 220 mg by mouth 2 (two) times daily with a meal.   Yes Historical Provider, MD  nystatin-triamcinolone (MYCOLOG II) cream APPLY TWICE A DAY 07/30/15  Yes Alanda Slim Defrancesco, MD  pantoprazole (PROTONIX) 40 MG tablet TAKE ONE TABLET (40 MG) BY MOUTH EVERY DAY 03/29/16  Yes Jerrol Banana., MD  SURE COMFORT PEN NEEDLES 31G X 5 MM MISC  07/30/15  Yes Historical Provider, MD  traZODone (DESYREL) 50 MG tablet TAKE 1 AND 1/2 TABLET AT BEDTIME AS NEEDED FOR SLEEP 01/15/16  Yes Margarita Rana, MD  vitamin C (ASCORBIC ACID) 500 MG tablet Take 500 mg by mouth daily.   Yes Historical Provider, MD  HYDROCODONE-GUAIFENESIN PO Take 5 mLs by mouth as needed.    Historical Provider, MD    Patient Active Problem List   Diagnosis Date Noted  . Hypokalemia 09/03/2015  . Insomnia 07/23/2015  . Headache 07/23/2015  . Type 2 diabetes mellitus ([place]) 06/24/2015  . Chronic vulvitis 06/02/2015  . Allergic rhinitis 05/30/2015  . Absolute anemia 05/30/2015  .  Back ache 05/30/2015  . Body mass index (BMI) of 29.0-29.9 in adult 05/30/2015  . Edema extremities 05/30/2015  . Dilatation of esophagus 05/30/2015  . Fall 05/30/2015  . H/O deep venous thrombosis 05/30/2015  . Hypercholesteremia 05/30/2015  . Heart & renal disease, hypertensive, with heart failure ([place]) 05/30/2015  . Below normal amount of sodium in the blood 05/30/2015  . Calculus of kidney 05/30/2015  . Gonalgia 05/30/2015  . Psoriasis 05/30/2015  . Avitaminosis D 05/30/2015  . Cough 04/01/2015  . Combined fat and carbohydrate induced hyperlipemia 12/30/2014  . Paroxysmal atrial fibrillation  ([place]) 07/10/2014  . Abnormal gait 07/08/2014  . Anxiety state 07/08/2014  . Chronic kidney disease 07/08/2014  . Acid reflux 07/08/2014  . 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[place]) 07/08/2014  . Chronic obstructive pulmonary disease ([place]) 07/08/2014  . Benign essential HTN 06/26/2014  . Carotid artery narrowing 06/26/2014  . Intermittent claudication ([place]) 06/26/2014  . Basal cell carcinoma of face 05/01/2014  . Disease of female genital organs 11/28/2012  . Incomplete bladder emptying 09/05/2012  . Excessive urination at night 08/15/2012  . Basal cell carcinoma of ear 10/26/2011    Past Medical History:  Diagnosis Date  . Angina pectoris ([place])   . Atrial fibrillation ([place])   . Cervicalgia   . Chronic back pain   . COPD (chronic obstructive pulmonary disease) ([place])   . DDD (degenerative disc disease), lumbar   . Diabetes mellitus without complication ([place])   . Dysphagia   . Hyperlipemia   . Hypertension   . Vulvovaginitis     Social History   Social History  . Marital status: Widowed    Spouse name: N/A  . Number of children: N/A  . Years of education: N/A   Occupational History  . Not on file.   Social History Main Topics  . Smoking status: Never Smoker  . Smokeless tobacco: Never Used  . Alcohol use No  . Drug use: No  . Sexual activity: No   Other Topics Concern  . Not on file   Social History Narrative  . No narrative on file    Allergies  Allergen Reactions  . Neomycin-Bacitracin Zn-Polymyx Swelling  . Benzalkonium Chloride Itching and Swelling  . Ibuprofen     Other reaction(s): Dizziness Heart fluttering. Tachycardia. Tachycardia.  . Valdecoxib Nausea And Vomiting  . Albuterol Rash  . Latex Rash  . Tape Rash    blisters  . Triamcinolone Rash    Review of Systems  Constitutional: Positive for malaise/fatigue.  HENT: Positive for congestion.   Respiratory: Positive for cough, sputum production and shortness of breath.   Cardiovascular:  Positive for chest pain (from coughing ).  Gastrointestinal: Negative.   Genitourinary: Negative.   Musculoskeletal: Negative.   Skin: Negative.   Neurological: Positive for headaches.  Endo/Heme/Allergies: Negative.   Psychiatric/Behavioral: Negative.     Immunization History  Administered Date(s) Administered  . Influenza, High Dose Seasonal PF 06/24/2015   Objective:  BP (!) 168/62 (BP Location: Left Arm, Patient Position: Sitting, Cuff Size: Normal)   Pulse 78   Temp 98 F (36.7 C) (Oral)   Resp 18   Wt 154 lb (69.9 kg)   SpO2 95%   BMI 28.17 kg/m   Physical Exam  Constitutional: She is oriented to person, place, and time and well-developed, well-nourished, and in no distress.  HENT:  Head: Normocephalic and atraumatic.  Right Ear: External ear normal.  Left Ear: External ear normal.  Nose: Nose normal.  Mouth/Throat:  Oropharynx is clear and moist.  Bilateral hearing aids in place.  Eyes: Conjunctivae and EOM are normal. Pupils are equal, round, and reactive to light.  Neck: Normal range of motion. Neck supple.  Cardiovascular: Normal rate, regular rhythm, normal heart sounds and intact distal pulses.   Pulmonary/Chest: Effort normal and breath sounds normal.  Abdominal: Soft.  Musculoskeletal: Normal range of motion.  Neurological: She is alert and oriented to person, place, and time. She has normal reflexes. Gait normal. GCS score is 15.  Skin: Skin is warm and dry.  Psychiatric: Mood, memory, affect and judgment normal.    Lab Results  Component Value Date   WBC 8.7 08/31/2015   HGB 14.6 08/31/2015   HCT 43.9 08/31/2015   PLT 266 08/31/2015   GLUCOSE 103 (H) 01/05/2016    CMP     Component Value Date/Time   NA 135 01/05/2016 1229   NA 136 09/04/2015 0806   NA 137 10/13/2014 1740   K 3.3 (L) 01/05/2016 1229   K 3.6 10/13/2014 1740   CL 95 (L) 01/05/2016 1229   CL 98 10/13/2014 1740   CO2 31 01/05/2016 1229   CO2 31 10/13/2014 1740   GLUCOSE 103  (H) 01/05/2016 1229   GLUCOSE 94 10/13/2014 1740   BUN 20 01/05/2016 1229   BUN 15 09/04/2015 0806   BUN 21 (H) 10/13/2014 1740   CREATININE 1.07 (H) 01/05/2016 1229   CREATININE 1.57 (H) 10/13/2014 1740   CALCIUM 9.8 01/05/2016 1229   CALCIUM 8.8 10/13/2014 1740   PROT 6.5 09/04/2015 0806   PROT 8.1 02/06/2012 1909   ALBUMIN 4.1 09/04/2015 0806   ALBUMIN 3.7 02/06/2012 1909   AST 17 09/04/2015 0806   AST 22 02/06/2012 1909   ALT 13 09/04/2015 0806   ALT 20 02/06/2012 1909   ALKPHOS 80 09/04/2015 0806   ALKPHOS 73 02/06/2012 1909   BILITOT 0.6 09/04/2015 0806   BILITOT 0.4 02/06/2012 1909   GFRNONAA 46 (L) 01/05/2016 1229   GFRNONAA 33 (L) 10/13/2014 1740   GFRNONAA 34 (L) 02/08/2012 0433   GFRAA 54 (L) 01/05/2016 1229   GFRAA 41 (L) 10/13/2014 1740   GFRAA 39 (L) 02/08/2012 0433    Assessment and Plan :  1. Cough  - doxycycline (VIBRA-TABS) 100 MG tablet; Take 1 tablet (100 mg total) by mouth 2 (two) times daily.  Dispense: 20 tablet; Refill: 0 - DG Chest 2 View; Future  2. Chronic bronchitis, unspecified chronic bronchitis type ([place]) She will follow-up if she worsens. No further workup today as she looks good today and she has no fever. - doxycycline (VIBRA-TABS) 100 MG tablet; Take 1 tablet (100 mg total) by mouth 2 (two) times daily.  Dispense: 20 tablet; Refill: 0 . 3. COPD exacerbation We'll not use prednisone initially because of diabetes 4. Type 2 diabetes Patient was seen and examined by Dr. Miguel Aschoff, and noted scribed by Webb Laws, [place] MD [facility] 04/29/2016 1:51 PM

## 2016-04-30 ENCOUNTER — Telehealth: Payer: Self-pay

## 2016-04-30 NOTE — Telephone Encounter (Signed)
Pt's daughter advised.   Thanks,   -Mickel Baas

## 2016-04-30 NOTE — Telephone Encounter (Signed)
-----   Message from Jerrol Banana., MD sent at 04/29/2016  3:24 PM EDT ----- No pneumonia.

## 2016-05-20 ENCOUNTER — Other Ambulatory Visit: Payer: Self-pay | Admitting: Family Medicine

## 2016-05-20 DIAGNOSIS — J449 Chronic obstructive pulmonary disease, unspecified: Secondary | ICD-10-CM

## 2016-05-20 DIAGNOSIS — G4700 Insomnia, unspecified: Secondary | ICD-10-CM

## 2016-05-20 DIAGNOSIS — I10 Essential (primary) hypertension: Secondary | ICD-10-CM

## 2016-05-21 ENCOUNTER — Other Ambulatory Visit: Payer: Self-pay

## 2016-05-21 DIAGNOSIS — F411 Generalized anxiety disorder: Secondary | ICD-10-CM

## 2016-05-21 NOTE — Telephone Encounter (Signed)
Refill request from [facility]. Thank you-aa

## 2016-05-21 NOTE — Telephone Encounter (Signed)
Saw you 04/29/2016. Renaldo Fiddler, CMA

## 2016-05-25 MED ORDER — ALPRAZOLAM 0.25 MG PO TABS: ORAL_TABLET | ORAL | 1 refills | Status: DC

## 2016-05-26 ENCOUNTER — Ambulatory Visit (INDEPENDENT_AMBULATORY_CARE_PROVIDER_SITE_OTHER): Payer: Medicare Other | Admitting: Family Medicine

## 2016-05-26 ENCOUNTER — Encounter: Payer: Self-pay | Admitting: Family Medicine

## 2016-05-26 VITALS — BP 154/62 | HR 80 | Temp 98.2°F | Ht 62.0 in | Wt 156.0 lb

## 2016-05-26 DIAGNOSIS — R413 Other amnesia: Secondary | ICD-10-CM | POA: Diagnosis not present

## 2016-05-26 DIAGNOSIS — R443 Hallucinations, unspecified: Secondary | ICD-10-CM | POA: Diagnosis not present

## 2016-05-26 DIAGNOSIS — E7800 Pure hypercholesterolemia, unspecified: Secondary | ICD-10-CM

## 2016-05-26 DIAGNOSIS — Z0000 Encounter for general adult medical examination without abnormal findings: Secondary | ICD-10-CM | POA: Diagnosis not present

## 2016-05-26 DIAGNOSIS — G4700 Insomnia, unspecified: Secondary | ICD-10-CM

## 2016-05-26 DIAGNOSIS — E119 Type 2 diabetes mellitus without complications: Secondary | ICD-10-CM | POA: Diagnosis not present

## 2016-05-26 MED ORDER — PREDNISONE 5 MG (48) PO TBPK
ORAL_TABLET | ORAL | 0 refills | Status: DC
Start: 2016-05-26 — End: 2016-06-16

## 2016-05-26 NOTE — Progress Notes (Signed)
Patient: [name], Female    DOB: 01/09/1929, 82 y.o.   MRN: [id number] Visit Date: 05/26/2016  Today's Provider: Wilhemena Durie, MD   Chief Complaint  Patient presents with  . Medicare Wellness   Subjective:   [name] is a 82 y.o. female who presents today for her Subsequent Annual Wellness Visit. She feels fairly well. She reports exercising not much but does walk around the yard. She reports she is sleeping well. Patient looks good and feels well but it is of note that I am concerned because of reported visual hallucinations. This is apparently a chronic problem for the patient not been documented in the past. It is also time for her routine lab work follow-up Last Colonoscopy 01/20/09 repeat 5 years  BMD 12/31/14 normal  Mammogram 12/19/09 normal  Pap smear 12/07/10 normal Immunization History  Administered Date(s) Administered  . Influenza, High Dose Seasonal PF 06/24/2015  . Pneumococcal Conjugate-13 11/28/2014  . Pneumococcal Polysaccharide-23 08/01/1995, 11/28/2014  . Td 05/20/2005   Review of Systems  Constitutional: Positive for chills and fatigue.  HENT: Positive for congestion, dental problem, hearing loss, rhinorrhea, tinnitus and voice change.   Eyes: Positive for photophobia and visual disturbance.  Respiratory: Positive for shortness of breath.   Cardiovascular: Positive for leg swelling.  Gastrointestinal: Negative.   Endocrine: Positive for cold intolerance and polydipsia.  Genitourinary: Negative.   Musculoskeletal: Positive for back pain and myalgias.  Skin: Positive for color change and rash.  Allergic/Immunologic: Negative.   Neurological: Positive for dizziness and light-headedness.  Hematological: Negative.   Psychiatric/Behavioral: Positive for hallucinations and self-injury.    Patient Active Problem List   Diagnosis Date Noted  . Hypokalemia 09/03/2015  . Insomnia 07/23/2015  . Headache 07/23/2015  . Type 2 diabetes  mellitus ([place]) 06/24/2015  . Chronic vulvitis 06/02/2015  . Allergic rhinitis 05/30/2015  . Absolute anemia 05/30/2015  . Back ache 05/30/2015  . Body mass index (BMI) of 29.0-29.9 in adult 05/30/2015  . Edema extremities 05/30/2015  . Dilatation of esophagus 05/30/2015  . Fall 05/30/2015  . H/O deep venous thrombosis 05/30/2015  . Hypercholesteremia 05/30/2015  . Heart & renal disease, hypertensive, with heart failure ([place]) 05/30/2015  . Below normal amount of sodium in the blood 05/30/2015  . Calculus of kidney 05/30/2015  . Gonalgia 05/30/2015  . Psoriasis 05/30/2015  . Avitaminosis D 05/30/2015  . Cough 04/01/2015  . Combined fat and carbohydrate induced hyperlipemia 12/30/2014  . Paroxysmal atrial fibrillation ([place]) 07/10/2014  . Abnormal gait 07/08/2014  . Anxiety state 07/08/2014  . Chronic kidney disease 07/08/2014  . Acid reflux 07/08/2014  . 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[place]) 07/08/2014  . Chronic obstructive pulmonary disease ([place]) 07/08/2014  . Benign essential HTN 06/26/2014  . Carotid artery narrowing 06/26/2014  . Intermittent claudication ([place]) 06/26/2014  . Basal cell carcinoma of face 05/01/2014  . Disease of female genital organs 11/28/2012  . Incomplete bladder emptying 09/05/2012  . Excessive urination at night 08/15/2012  . Basal cell carcinoma of ear 10/26/2011    Social History   Social History  . Marital status: Widowed    Spouse name: N/A  . Number of children: N/A  . Years of education: N/A   Occupational History  . Not on file.   Social History Main Topics  . Smoking status: Never Smoker  . Smokeless tobacco: Never Used  . Alcohol use No  . Drug use: No  . Sexual activity: No   Other Topics Concern  .  Not on file   Social History Narrative  . No narrative on file    Past Surgical History:  Procedure Laterality Date  . gall bladder    . melanoma     removal neck and back  . PERIPHERAL VASCULAR CATHETERIZATION Left 01/05/2016    Procedure: Lower Extremity Angiography;  Surgeon: Algernon Huxley, MD;  Location: [facility];  Service: Cardiovascular;  Laterality: Left;  . PERIPHERAL VASCULAR CATHETERIZATION  01/05/2016   Procedure: Lower Extremity Intervention;  Surgeon: Algernon Huxley, MD;  Location: [facility];  Service: Cardiovascular;;  . ureterolithiasis     calculus removed  . VAGINAL HYSTERECTOMY    . VULVA / PERINEUM BIOPSY  05/29/2015    Her family history includes Colon cancer in her brother; Diabetes in her brother and sister; Ovarian cancer in her other.    Outpatient Medications Prior to Visit  Medication Sig Dispense Refill  . acetaminophen (TYLENOL) 500 MG tablet Take 1,000 mg by mouth every 6 (six) hours as needed.    . ALPRAZolam (XANAX) 0.25 MG tablet TAKE ONE TABLET BY MOUTH AT BEDTIME AS NEEDED FOR ANXIETY 90 tablet 1  . aspirin 325 MG tablet Take 325 mg by mouth daily. Reported on 01/05/2016    . ASSURE COMFORT LANCETS 30G MISC     . Blood Glucose Calibration (ACCU-CHEK SMARTVIEW CONTROL) LIQD     . cetirizine (ZYRTEC) 10 MG tablet TAKE ONE TABLET BY MOUTH DAILY 90 tablet 1  . clobetasol ointment (TEMOVATE) 0.05 % Apply topically to the affected area twice a day for 2 weeks, then once a day for 2 weeks. 30 g 2  . diltiazem (CARDIZEM CD) 180 MG 24 hr capsule TAKE 1 CAPSULE EVERY DAY 30 capsule 5  . FLUoxetine (PROZAC) 20 MG capsule TAKE 40MG  TUE THUR SAT SUN  AND 60MG  MON WED  FRI 90 capsule 5  . glucose blood test strip ACCU-CHEK AVIVA PLUS TEST STRP Use to check sugars 3 times daily.    . hydrocortisone 2.5 % cream Reported on 01/05/2016    . Insulin Glargine (LANTUS SOLOSTAR) 100 UNIT/ML Solostar Pen INJECT 15 UNITS SUBQ AT BEDTIME    . insulin lispro (HUMALOG KWIKPEN) 100 UNIT/ML KiwkPen Inject into the skin.    Marland Kitchen losartan-hydrochlorothiazide (HYZAAR) 50-12.5 MG tablet TAKE ONE TABLET EVERY DAY 90 tablet 3  . montelukast (SINGULAIR) 10 MG tablet TAKE ONE TABLET BY MOUTH AT BEDTIME  90 tablet 1  . naproxen sodium (ANAPROX) 220 MG tablet Take 220 mg by mouth 2 (two) times daily with a meal.    . nystatin-triamcinolone (MYCOLOG II) cream APPLY TWICE A DAY 30 g 4  . pantoprazole (PROTONIX) 40 MG tablet TAKE ONE TABLET (40 MG) BY MOUTH EVERY DAY 90 tablet 1  . SURE COMFORT PEN NEEDLES 31G X 5 MM MISC     . traZODone (DESYREL) 50 MG tablet TAKE 1 & 1/2 TABLETS BY MOUTH AT BEDTIMEAS NEEDED FOR SLEEP 45 tablet 12  . vitamin C (ASCORBIC ACID) 500 MG tablet Take 500 mg by mouth daily.    . benzonatate (TESSALON) 100 MG capsule TAKE 1 CAPSULE BY MOUTH 3 TIMES DAILY ASNEEDED FOR COUGH 90 capsule 0  . doxycycline (VIBRA-TABS) 100 MG tablet Take 1 tablet (100 mg total) by mouth 2 (two) times daily. 20 tablet 0  . HYDROCODONE-GUAIFENESIN PO Take 5 mLs by mouth as needed.    Marland Kitchen losartan-hydrochlorothiazide (HYZAAR) 50-12.5 MG tablet TAKE ONE TABLET EVERY DAY 90 tablet  1   No facility-administered medications prior to visit.     Allergies  Allergen Reactions  . Neomycin-Bacitracin Zn-Polymyx Swelling  . Benzalkonium Chloride Itching and Swelling  . Ibuprofen     Other reaction(s): Dizziness Heart fluttering. Tachycardia. Tachycardia.  . Valdecoxib Nausea And Vomiting  . Albuterol Rash  . Latex Rash  . Tape Rash    blisters  . Triamcinolone Rash    Patient Care Team: Jerrol Banana., MD as PCP - General (Family Medicine)  Objective:   Vitals:  Vitals:   05/26/16 1406  BP: (!) 154/62  Pulse: 80  Temp: 98.2 F (36.8 C)  SpO2: (!) 14%  Weight: 156 lb (70.8 kg)  Height: 5\' 2"  (1.575 m)    Physical Exam  Constitutional: She is oriented to person, place, and time. She appears well-developed and well-nourished.  HENT:  Head: Normocephalic and atraumatic.  Right Ear: External ear normal.  Left Ear: External ear normal.  Eyes: Conjunctivae are normal. Pupils are equal, round, and reactive to light.  Neck: Normal range of motion. Neck supple.  Cardiovascular:  Normal rate, regular rhythm, normal heart sounds and intact distal pulses.   No murmur heard. Pulmonary/Chest: Effort normal and breath sounds normal. No respiratory distress. She has no wheezes.  Abdominal: Soft. She exhibits no distension. There is no tenderness.  Musculoskeletal: She exhibits no edema or tenderness.  Neurological: She is alert and oriented to person, place, and time.  Skin: No rash noted. No erythema.  Psychiatric: She has a normal mood and affect. Her speech is normal. Judgment and thought content normal. Cognition and memory are impaired (slightly).    Activities of Daily Living In your present state of health, do you have any difficulty performing the following activities: 05/26/2016  Hearing? Y  Vision? N  Difficulty concentrating or making decisions? Y  Walking or climbing stairs? Y  Dressing or bathing? N  Doing errands, shopping? N  Some recent data might be hidden    Fall Risk Assessment Fall Risk  05/26/2016  Falls in the past year? Yes  Number falls in past yr: 1  Injury with Fall? No     Depression Screen PHQ 2/9 Scores 05/26/2016  PHQ - 2 Score 1    Cognitive Testing - 6-CIT    Year: 0 4 points  Month: 0 3 points  Memorize "Pia Mau, [address], [place]"  Time (within 1 hour:) 0 3 points  Count backwards from 20: 0 2 4 points  Name months of year: 0 2 4 points  Repeat Address: 0 2 4 6 8 10  points   Total Score: 8/28  Interpretation : Normal (0-7) Abnormal (8-28)    Assessment & Plan:     Annual Wellness Visit  Reviewed patient's Family Medical History Reviewed and updated list of patient's medical providers Assessment of cognitive impairment was done Assessed patient's functional ability Established a written schedule for health screening [place] Completed and Reviewed  1. Medicare annual wellness visit, subsequent  2. Hallucination Patient sees dead family members at night time by her bed and  sometimes sees people during the day that are not there. She is not hearing any voices at this time, advised patient to let us know if she does. People she sees do not talk to her and she is not suicidal at this time. She feels her depression is stable on her medications.She is having visual hallucinations but no auditory hallucinations. I think this is consistent  with early dementia. Consider referral to neurology.  3. Memory impairment/Possible early dementia. 6 CIT is borderline abnormal today. MMSE on next visit. May need full workup. It is been 20 years since I have seen this lady and she has had progressive memory loss but she seems to be appropriate today. She does have some memory loss and she did when I took care of patient years ago before Dr Venia Minks took over her as a patient.  4. Insomnia 5. Type 2 diabetes mellitus without complication, without long-term current use of insulin [facility]) Sees endocrinologist at [facility].  COPD Will treat cough with Prednisone and she may need inhaler also, will re check on the next visit  Patient was seen and examined by Dr. Eulas Post and note was scribed by Theressa Millard, RMA.    Miguel Aschoff MD [facility] 05/26/2016 2:11 PM  ------------------------------------------------------------------------------------------------------------

## 2016-05-27 ENCOUNTER — Telehealth: Payer: Self-pay | Admitting: Emergency Medicine

## 2016-05-27 LAB — LIPID PANEL WITH LDL/HDL RATIO
CHOLESTEROL TOTAL: 237 mg/dL — AB (ref 100–199)
HDL: 54 mg/dL (ref >39)
LDL CALC: 120 mg/dL — AB (ref 0–99)
LDl/HDL Ratio: 2.2 ratio units (ref 0.0–3.2)
TRIGLYCERIDES: 313 mg/dL — AB (ref 0–149)
VLDL CHOLESTEROL CAL: 63 mg/dL — AB (ref 5–40)

## 2016-05-27 LAB — CBC WITH DIFFERENTIAL/PLATELET
BASOS ABS: 0 10*3/uL (ref 0.0–0.2)
Basos: 0 %
EOS (ABSOLUTE): 0.4 10*3/uL (ref 0.0–0.4)
EOS: 4 %
Hematocrit: 38 % (ref 34.0–46.6)
Hemoglobin: 12.2 g/dL (ref 11.1–15.9)
IMMATURE GRANULOCYTES: 0 %
Immature Grans (Abs): 0 10*3/uL (ref 0.0–0.1)
Lymphocytes Absolute: 3.4 10*3/uL — ABNORMAL HIGH (ref 0.7–3.1)
Lymphs: 32 %
MCH: 26.6 pg (ref 26.6–33.0)
MCHC: 32.1 g/dL (ref 31.5–35.7)
MCV: 83 fL (ref 79–97)
MONOS ABS: 1.1 10*3/uL — AB (ref 0.1–0.9)
Monocytes: 10 %
NEUTROS PCT: 54 %
Neutrophils Absolute: 5.8 10*3/uL (ref 1.4–7.0)
PLATELETS: 293 10*3/uL (ref 150–379)
RBC: 4.58 x10E6/uL (ref 3.77–5.28)
RDW: 15.3 % (ref 12.3–15.4)
WBC: 10.8 10*3/uL (ref 3.4–10.8)

## 2016-05-27 LAB — COMPREHENSIVE METABOLIC PANEL
ALK PHOS: 105 IU/L (ref 39–117)
ALT: 19 IU/L (ref 0–32)
AST: 17 IU/L (ref 0–40)
Albumin/Globulin Ratio: 1.3 (ref 1.2–2.2)
Albumin: 4.1 g/dL (ref 3.5–4.7)
BUN/Creatinine Ratio: 15 (ref 12–28)
BUN: 16 mg/dL (ref 8–27)
Bilirubin Total: 0.3 mg/dL (ref 0.0–1.2)
CO2: 29 mmol/L (ref 18–29)
CREATININE: 1.06 mg/dL — AB (ref 0.57–1.00)
Calcium: 9.2 mg/dL (ref 8.7–10.3)
Chloride: 92 mmol/L — ABNORMAL LOW (ref 96–106)
GFR calc Af Amer: 55 mL/min/{1.73_m2} — ABNORMAL LOW (ref >59)
GFR, EST NON AFRICAN AMERICAN: 48 mL/min/{1.73_m2} — AB (ref >59)
GLUCOSE: 97 mg/dL (ref 65–99)
Globulin, Total: 3.1 g/dL (ref 1.5–4.5)
POTASSIUM: 3.4 mmol/L — AB (ref 3.5–5.2)
SODIUM: 139 mmol/L (ref 134–144)
Total Protein: 7.2 g/dL (ref 6.0–8.5)

## 2016-05-27 LAB — TSH: TSH: 2.75 u[IU]/mL (ref 0.450–4.500)

## 2016-05-27 NOTE — Telephone Encounter (Signed)
Spoke with pt daughter and informed her of pt labs. She states that last time pt took prednisone she had hallucinations. She is also having them per last OV, while not taking prednisone. She will see how she does, if pt decides to take the medication. She will call and let us know if she has the same reaction to them. She also states that pt should have someone coming to her doctors visits and should not be driving. She states that she will come with her to her next OV and discuss this issues with you.

## 2016-06-01 ENCOUNTER — Ambulatory Visit: Payer: Medicare Other | Admitting: Podiatry

## 2016-06-03 ENCOUNTER — Encounter: Payer: Self-pay | Admitting: Podiatry

## 2016-06-03 ENCOUNTER — Ambulatory Visit: Payer: Medicare Other | Admitting: Podiatry

## 2016-06-03 ENCOUNTER — Ambulatory Visit (INDEPENDENT_AMBULATORY_CARE_PROVIDER_SITE_OTHER): Payer: Medicare Other | Admitting: Podiatry

## 2016-06-03 DIAGNOSIS — M79676 Pain in unspecified toe(s): Secondary | ICD-10-CM

## 2016-06-03 DIAGNOSIS — B351 Tinea unguium: Secondary | ICD-10-CM

## 2016-06-03 MED ORDER — SULFAMETHOXAZOLE-TRIMETHOPRIM 800-160 MG PO TABS: 1.0000 | ORAL_TABLET | Freq: Two times a day (BID) | ORAL | 0 refills | Status: AC

## 2016-06-03 NOTE — Patient Instructions (Signed)

## 2016-06-03 NOTE — Progress Notes (Signed)
SUBJECTIVE Patient with a history of diabetes mellitus presents to office today complaining of elongated, thickened nails. Pain while ambulating in shoes. Patient is unable to trim their own nails.   Allergies  Allergen Reactions  . Neomycin-Bacitracin Zn-Polymyx Swelling  . Benzalkonium Chloride Itching and Swelling  . Ibuprofen     Other reaction(s): Dizziness Heart fluttering. Tachycardia. Tachycardia.  . Valdecoxib Nausea And Vomiting  . Albuterol Rash  . Latex Rash  . Tape Rash    blisters  . Triamcinolone Rash    OBJECTIVE General Patient is awake, alert, and oriented x 3 and in no acute distress. Derm Skin is dry and supple bilateral. Negative open lesions or macerations. Remaining integument unremarkable. Nails are tender, long, thickened and dystrophic with subungual debris, consistent with onychomycosis, 1-5 bilateral. No signs of infection noted. Vasc  DP and PT pedal pulses palpable bilaterally. Temperature gradient within normal limits.  Neuro Epicritic and protective threshold sensation diminished bilaterally.  Musculoskeletal Exam No symptomatic pedal deformities noted bilateral. Muscular strength within normal limits.  ASSESSMENT 1. Diabetes Mellitus w/ peripheral neuropathy 2. Onychomycosis of nail due to dermatophyte bilateral 3. Pain in foot bilateral  PLAN OF CARE Patient evaluated today. Instructed to maintain good pedal hygiene and foot care. Stressed importance of controlling blood sugar.  Mechanical debridement of nails 1-5 bilaterally performed using a nail nipper. Filed with dremel without incident.  All patient questions were answered. Return to clinic in 3 mos.    Edrick Kins, DPM

## 2016-06-16 ENCOUNTER — Ambulatory Visit (INDEPENDENT_AMBULATORY_CARE_PROVIDER_SITE_OTHER): Payer: Medicare Other | Admitting: Family Medicine

## 2016-06-16 VITALS — BP 164/62 | HR 84 | Temp 98.3°F | Resp 16 | Wt 153.0 lb

## 2016-06-16 DIAGNOSIS — F411 Generalized anxiety disorder: Secondary | ICD-10-CM | POA: Diagnosis not present

## 2016-06-16 DIAGNOSIS — Z23 Encounter for immunization: Secondary | ICD-10-CM | POA: Diagnosis not present

## 2016-06-16 DIAGNOSIS — G3184 Mild cognitive impairment, so stated: Secondary | ICD-10-CM

## 2016-06-16 DIAGNOSIS — G4700 Insomnia, unspecified: Secondary | ICD-10-CM | POA: Diagnosis not present

## 2016-06-16 DIAGNOSIS — R05 Cough: Secondary | ICD-10-CM

## 2016-06-16 DIAGNOSIS — R443 Hallucinations, unspecified: Secondary | ICD-10-CM

## 2016-06-16 DIAGNOSIS — I10 Essential (primary) hypertension: Secondary | ICD-10-CM

## 2016-06-16 DIAGNOSIS — R059 Cough, unspecified: Secondary | ICD-10-CM

## 2016-06-16 MED ORDER — LOSARTAN POTASSIUM-HCTZ 100-25 MG PO TABS
1.0000 | ORAL_TABLET | Freq: Every day | ORAL | 3 refills | Status: DC
Start: 2016-06-16 — End: 2017-02-15

## 2016-06-16 MED ORDER — TIOTROPIUM BROMIDE MONOHYDRATE 1.25 MCG/ACT IN AERS: 2.0000 | INHALATION_SPRAY | Freq: Every day | RESPIRATORY_TRACT | 0 refills | Status: DC

## 2016-06-16 NOTE — Progress Notes (Signed)
[name]  MRN: [id number] DOB: December 13, 1928  Subjective:  HPI  Patient is here for follow up after last visit on 8/30. COUGH: patient was started on Prednsione and she took it every day until they day of where she has to take 2 tablets and had to stop due to feeling very jittery. COugh improved a little but not much.  Patient is still seen dead people but they are not talking to her. MMSE today is 28/30. Patient Active Problem List   Diagnosis Date Noted  . Hypokalemia 09/03/2015  . Insomnia 07/23/2015  . Headache 07/23/2015  . Type 2 diabetes mellitus ([place]) 06/24/2015  . Chronic vulvitis 06/02/2015  . Allergic rhinitis 05/30/2015  . Absolute anemia 05/30/2015  . Back ache 05/30/2015  . Body mass index (BMI) of 29.0-29.9 in adult 05/30/2015  . Edema extremities 05/30/2015  . Dilatation of esophagus 05/30/2015  . Fall 05/30/2015  . H/O deep venous thrombosis 05/30/2015  . Hypercholesteremia 05/30/2015  . Heart & renal disease, hypertensive, with heart failure ([place]) 05/30/2015  . Below normal amount of sodium in the blood 05/30/2015  . Calculus of kidney 05/30/2015  . Gonalgia 05/30/2015  . Psoriasis 05/30/2015  . Avitaminosis D 05/30/2015  . Cough 04/01/2015  . Combined fat and carbohydrate induced hyperlipemia 12/30/2014  . Paroxysmal atrial fibrillation ([place]) 07/10/2014  . Abnormal gait 07/08/2014  . Anxiety state 07/08/2014  . Chronic kidney disease 07/08/2014  . Acid reflux 07/08/2014  . 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[place]) 07/08/2014  . Chronic obstructive pulmonary disease ([place]) 07/08/2014  . Benign essential HTN 06/26/2014  . Carotid artery narrowing 06/26/2014  . Intermittent claudication ([place]) 06/26/2014  . Basal cell carcinoma of face 05/01/2014  . Disease of female genital organs 11/28/2012  . Incomplete bladder emptying 09/05/2012  . Excessive urination at night 08/15/2012  . Basal cell carcinoma of ear 10/26/2011    Past Medical History:    Diagnosis Date  . Angina pectoris ([place])   . Atrial fibrillation ([place])   . Cervicalgia   . Chronic back pain   . COPD (chronic obstructive pulmonary disease) ([place])   . DDD (degenerative disc disease), lumbar   . Diabetes mellitus without complication ([place])   . Dysphagia   . Hyperlipemia   . Hypertension   . Vulvovaginitis     Social History   Social History  . Marital status: Widowed    Spouse name: N/A  . Number of children: N/A  . Years of education: N/A   Occupational History  . Not on file.   Social History Main Topics  . Smoking status: Never Smoker  . Smokeless tobacco: Never Used  . Alcohol use No  . Drug use: No  . Sexual activity: No   Other Topics Concern  . Not on file   Social History Narrative  . No narrative on file    Outpatient Encounter Prescriptions as of 06/16/2016  Medication Sig Note  . acetaminophen (TYLENOL) 500 MG tablet Take 1,000 mg by mouth every 6 (six) hours as needed.   . ALPRAZolam (XANAX) 0.25 MG tablet TAKE ONE TABLET BY MOUTH AT BEDTIME AS NEEDED FOR ANXIETY   . aspirin 325 MG tablet Take 325 mg by mouth daily. Reported on 01/05/2016 05/29/2015: Received from: [facility]  . ASSURE COMFORT LANCETS 30G MISC  09/03/2015: Received from: External Pharmacy  . Blood Glucose Calibration (ACCU-CHEK SMARTVIEW CONTROL) LIQD  09/03/2015: Received from: External Pharmacy  . cetirizine (ZYRTEC) 10 MG tablet TAKE ONE  TABLET BY MOUTH DAILY   . clobetasol ointment (TEMOVATE) 0.05 % Apply topically to the affected area twice a day for 2 weeks, then once a day for 2 weeks.   Marland Kitchen diltiazem (CARDIZEM CD) 180 MG 24 hr capsule TAKE 1 CAPSULE EVERY DAY   . FLUoxetine (PROZAC) 20 MG capsule TAKE 40MG  TUE THUR SAT SUN  AND 60MG  MON WED  FRI   . glucose blood test strip ACCU-CHEK AVIVA PLUS TEST STRP Use to check sugars 3 times daily. 12/05/2015: Received from: [place]:   . hydrocortisone 2.5 % cream Reported on  01/05/2016 09/03/2015: Received from: External Pharmacy  . Insulin Glargine (LANTUS SOLOSTAR) 100 UNIT/ML Solostar Pen INJECT 15 UNITS SUBQ AT BEDTIME 05/29/2015: Received from: [facility]  . insulin lispro (HUMALOG KWIKPEN) 100 UNIT/ML KiwkPen Inject into the skin. 05/29/2015: Received from: [facility]  . losartan-hydrochlorothiazide (HYZAAR) 100-25 MG tablet Take 1 tablet by mouth daily.   . montelukast (SINGULAIR) 10 MG tablet TAKE ONE TABLET BY MOUTH AT BEDTIME   . naproxen sodium (ANAPROX) 220 MG tablet Take 220 mg by mouth 2 (two) times daily with a meal.   . nystatin-triamcinolone (MYCOLOG II) cream APPLY TWICE A DAY   . pantoprazole (PROTONIX) 40 MG tablet TAKE ONE TABLET (40 MG) BY MOUTH EVERY DAY   . SURE COMFORT PEN NEEDLES 31G X 5 MM MISC  09/03/2015: Received from: External Pharmacy  . traZODone (DESYREL) 50 MG tablet TAKE 1 & 1/2 TABLETS BY MOUTH AT BEDTIMEAS NEEDED FOR SLEEP   . vitamin C (ASCORBIC ACID) 500 MG tablet Take 500 mg by mouth daily.   . [DISCONTINUED] losartan-hydrochlorothiazide (HYZAAR) 50-12.5 MG tablet TAKE ONE TABLET EVERY DAY   . [DISCONTINUED] predniSONE (STERAPRED UNI-PAK 48 TAB) 5 MG (48) TBPK tablet As directed    No facility-administered encounter medications on file as of 06/16/2016.     Allergies  Allergen Reactions  . Neomycin-Bacitracin Zn-Polymyx Swelling  . Benzalkonium Chloride Itching and Swelling  . Ibuprofen     Other reaction(s): Dizziness Heart fluttering. Tachycardia. Tachycardia.  . Valdecoxib Nausea And Vomiting  . Albuterol Rash  . Latex Rash  . Tape Rash    blisters  . Triamcinolone Rash    Review of Systems  Constitutional: Negative.   Respiratory: Positive for cough and shortness of breath.   Cardiovascular: Negative.   Musculoskeletal: Negative.   Neurological: Negative.   Psychiatric/Behavioral: Positive for hallucinations and memory loss. Negative for substance abuse and suicidal ideas.     Objective:  BP (!) 164/62   Pulse 84   Temp 98.3 F (36.8 C)   Resp 16   Wt 153 lb (69.4 kg)   BMI 27.98 kg/m   Physical Exam  Constitutional: She is oriented to person, place, and time and well-developed, well-nourished, and in no distress.  HENT:  Head: Normocephalic and atraumatic.  Eyes: Conjunctivae are normal. Pupils are equal, round, and reactive to light.  Neck: Normal range of motion. Neck supple.  Cardiovascular: Normal rate, regular rhythm, normal heart sounds and intact distal pulses.   No murmur heard. Pulmonary/Chest: Effort normal and breath sounds normal. No respiratory distress. She has no wheezes.  Musculoskeletal: She exhibits no edema or tenderness.  Neurological: She is alert and oriented to person, place, and time.  Psychiatric: Mood and affect normal.    Assessment and Plan :  1. Hallucination/Chronic delusions--I think this is due to mild progressive dementia Still present. This could be  from taking too much of Prozac possibly. Will speak with pharmacist and check on her medications and what she is taking. MMSE 20/30. I am very  surprised that she did so well. Her functional level is much lower. More than 50% of 25 minute visit is spent in counseling. I do not think the patient should drive. 2. COPD Not much better. Try Spiriva Respimat and re check in 2 weeks. 3. Insomnia  4. Anxiety state Stable, possibly need to wean off Prozac. Decrease Prozac to 40 mg daily. Stop Xanax for now. 5. Benign essential HTN Still elevated. Will increase Hyzaar to the next dose. Follow.  HPI, Exam and A&P transcribed under direction and in the presence of Miguel Aschoff, MD.  I have done the exam and reviewed the chart and it is accurate to the best of my knowledge. Miguel Aschoff M.D. [facility]

## 2016-06-17 MED ORDER — FLUOXETINE HCL 40 MG PO CAPS: ORAL_CAPSULE | ORAL | 5 refills | Status: DC

## 2016-06-23 DIAGNOSIS — F32A Depression, unspecified: Secondary | ICD-10-CM | POA: Insufficient documentation

## 2016-06-23 DIAGNOSIS — F329 Major depressive disorder, single episode, unspecified: Secondary | ICD-10-CM | POA: Insufficient documentation

## 2016-06-24 ENCOUNTER — Other Ambulatory Visit: Payer: Self-pay | Admitting: Neurology

## 2016-06-24 DIAGNOSIS — R410 Disorientation, unspecified: Secondary | ICD-10-CM

## 2016-06-24 DIAGNOSIS — R412 Retrograde amnesia: Secondary | ICD-10-CM

## 2016-06-25 ENCOUNTER — Other Ambulatory Visit: Payer: Self-pay | Admitting: Family Medicine

## 2016-06-25 DIAGNOSIS — J3089 Other allergic rhinitis: Secondary | ICD-10-CM

## 2016-06-25 DIAGNOSIS — J449 Chronic obstructive pulmonary disease, unspecified: Secondary | ICD-10-CM

## 2016-07-01 ENCOUNTER — Emergency Department: Payer: Medicare Other

## 2016-07-01 ENCOUNTER — Telehealth: Payer: Self-pay

## 2016-07-01 ENCOUNTER — Encounter: Payer: Self-pay | Admitting: *Deleted

## 2016-07-01 ENCOUNTER — Observation Stay
Admission: EM | Admit: 2016-07-01 | Discharge: 2016-07-02 | Disposition: A | Discharge status: Home / Self Care | Payer: Medicare Other | Attending: Internal Medicine | Admitting: Internal Medicine

## 2016-07-01 DIAGNOSIS — C44310 Basal cell carcinoma of skin of unspecified parts of face: Secondary | ICD-10-CM | POA: Diagnosis not present

## 2016-07-01 DIAGNOSIS — Z90710 Acquired absence of both cervix and uterus: Secondary | ICD-10-CM | POA: Insufficient documentation

## 2016-07-01 DIAGNOSIS — E7801 Familial hypercholesterolemia: Secondary | ICD-10-CM | POA: Diagnosis not present

## 2016-07-01 DIAGNOSIS — N189 Chronic kidney disease, unspecified: Secondary | ICD-10-CM | POA: Insufficient documentation

## 2016-07-01 DIAGNOSIS — J449 Chronic obstructive pulmonary disease, unspecified: Secondary | ICD-10-CM | POA: Insufficient documentation

## 2016-07-01 DIAGNOSIS — I130 Hypertensive heart and chronic kidney disease with heart failure and stage 1 through stage 4 chronic kidney disease, or unspecified chronic kidney disease: Secondary | ICD-10-CM | POA: Diagnosis not present

## 2016-07-01 DIAGNOSIS — R339 Retention of urine, unspecified: Secondary | ICD-10-CM | POA: Insufficient documentation

## 2016-07-01 DIAGNOSIS — R51 Headache: Secondary | ICD-10-CM | POA: Insufficient documentation

## 2016-07-01 DIAGNOSIS — I480 Paroxysmal atrial fibrillation: Secondary | ICD-10-CM | POA: Insufficient documentation

## 2016-07-01 DIAGNOSIS — L409 Psoriasis, unspecified: Secondary | ICD-10-CM | POA: Insufficient documentation

## 2016-07-01 DIAGNOSIS — E876 Hypokalemia: Secondary | ICD-10-CM | POA: Diagnosis present

## 2016-07-01 DIAGNOSIS — E785 Hyperlipidemia, unspecified: Secondary | ICD-10-CM | POA: Insufficient documentation

## 2016-07-01 DIAGNOSIS — I739 Peripheral vascular disease, unspecified: Secondary | ICD-10-CM | POA: Diagnosis not present

## 2016-07-01 DIAGNOSIS — Z79899 Other long term (current) drug therapy: Secondary | ICD-10-CM | POA: Insufficient documentation

## 2016-07-01 DIAGNOSIS — Z8041 Family history of malignant neoplasm of ovary: Secondary | ICD-10-CM | POA: Insufficient documentation

## 2016-07-01 DIAGNOSIS — R079 Chest pain, unspecified: Secondary | ICD-10-CM | POA: Diagnosis present

## 2016-07-01 DIAGNOSIS — E1122 Type 2 diabetes mellitus with diabetic chronic kidney disease: Secondary | ICD-10-CM | POA: Diagnosis not present

## 2016-07-01 DIAGNOSIS — Z794 Long term (current) use of insulin: Secondary | ICD-10-CM | POA: Insufficient documentation

## 2016-07-01 DIAGNOSIS — J309 Allergic rhinitis, unspecified: Secondary | ICD-10-CM | POA: Diagnosis not present

## 2016-07-01 DIAGNOSIS — D649 Anemia, unspecified: Secondary | ICD-10-CM | POA: Insufficient documentation

## 2016-07-01 DIAGNOSIS — K219 Gastro-esophageal reflux disease without esophagitis: Secondary | ICD-10-CM | POA: Diagnosis not present

## 2016-07-01 DIAGNOSIS — Z833 Family history of diabetes mellitus: Secondary | ICD-10-CM | POA: Insufficient documentation

## 2016-07-01 DIAGNOSIS — Z86718 Personal history of other venous thrombosis and embolism: Secondary | ICD-10-CM | POA: Diagnosis not present

## 2016-07-01 DIAGNOSIS — R42 Dizziness and giddiness: Secondary | ICD-10-CM | POA: Insufficient documentation

## 2016-07-01 DIAGNOSIS — Z800 Family history of malignant neoplasm of digestive organs: Secondary | ICD-10-CM | POA: Insufficient documentation

## 2016-07-01 DIAGNOSIS — E559 Vitamin D deficiency, unspecified: Secondary | ICD-10-CM | POA: Insufficient documentation

## 2016-07-01 DIAGNOSIS — T502X5A Adverse effect of carbonic-anhydrase inhibitors, benzothiadiazides and other diuretics, initial encounter: Secondary | ICD-10-CM | POA: Insufficient documentation

## 2016-07-01 DIAGNOSIS — R0789 Other chest pain: Secondary | ICD-10-CM | POA: Diagnosis not present

## 2016-07-01 DIAGNOSIS — C44211 Basal cell carcinoma of skin of unspecified ear and external auricular canal: Secondary | ICD-10-CM | POA: Insufficient documentation

## 2016-07-01 DIAGNOSIS — I4581 Long QT syndrome: Secondary | ICD-10-CM | POA: Insufficient documentation

## 2016-07-01 DIAGNOSIS — G8929 Other chronic pain: Secondary | ICD-10-CM | POA: Insufficient documentation

## 2016-07-01 DIAGNOSIS — N200 Calculus of kidney: Secondary | ICD-10-CM | POA: Insufficient documentation

## 2016-07-01 DIAGNOSIS — D1809 Hemangioma of other sites: Secondary | ICD-10-CM | POA: Insufficient documentation

## 2016-07-01 DIAGNOSIS — Z9049 Acquired absence of other specified parts of digestive tract: Secondary | ICD-10-CM | POA: Insufficient documentation

## 2016-07-01 DIAGNOSIS — Z7982 Long term (current) use of aspirin: Secondary | ICD-10-CM | POA: Insufficient documentation

## 2016-07-01 DIAGNOSIS — N763 Subacute and chronic vulvitis: Secondary | ICD-10-CM | POA: Insufficient documentation

## 2016-07-01 DIAGNOSIS — F411 Generalized anxiety disorder: Secondary | ICD-10-CM | POA: Insufficient documentation

## 2016-07-01 DIAGNOSIS — I509 Heart failure, unspecified: Secondary | ICD-10-CM | POA: Diagnosis not present

## 2016-07-01 DIAGNOSIS — M549 Dorsalgia, unspecified: Secondary | ICD-10-CM | POA: Insufficient documentation

## 2016-07-01 DIAGNOSIS — G4700 Insomnia, unspecified: Secondary | ICD-10-CM | POA: Insufficient documentation

## 2016-07-01 DIAGNOSIS — M5136 Other intervertebral disc degeneration, lumbar region: Secondary | ICD-10-CM | POA: Insufficient documentation

## 2016-07-01 DIAGNOSIS — Z85820 Personal history of malignant melanoma of skin: Secondary | ICD-10-CM | POA: Insufficient documentation

## 2016-07-01 LAB — PROTIME-INR
INR: 0.92
Prothrombin Time: 12.4 seconds (ref 11.4–15.2)

## 2016-07-01 LAB — CBC
HEMATOCRIT: 39.5 % (ref 35.0–47.0)
Hemoglobin: 13.1 g/dL (ref 12.0–16.0)
MCH: 27.3 pg (ref 26.0–34.0)
MCHC: 33.3 g/dL (ref 32.0–36.0)
MCV: 82 fL (ref 80.0–100.0)
PLATELETS: 306 10*3/uL (ref 150–440)
RBC: 4.81 MIL/uL (ref 3.80–5.20)
RDW: 15.3 % — AB (ref 11.5–14.5)
WBC: 12 10*3/uL — AB (ref 3.6–11.0)

## 2016-07-01 LAB — BASIC METABOLIC PANEL
Anion gap: 10 (ref 5–15)
BUN: 18 mg/dL (ref 6–20)
CALCIUM: 9.2 mg/dL (ref 8.9–10.3)
CO2: 31 mmol/L (ref 22–32)
CREATININE: 1.12 mg/dL — AB (ref 0.44–1.00)
Chloride: 92 mmol/L — ABNORMAL LOW (ref 101–111)
GFR calc Af Amer: 50 mL/min — ABNORMAL LOW (ref >60)
GFR, EST NON AFRICAN AMERICAN: 44 mL/min — AB (ref >60)
GLUCOSE: 138 mg/dL — AB (ref 65–99)
Potassium: 2.6 mmol/L — CL (ref 3.5–5.1)
SODIUM: 133 mmol/L — AB (ref 135–145)

## 2016-07-01 LAB — TROPONIN I: Troponin I: <0.03 ng/mL (ref <0.03)

## 2016-07-01 LAB — GLUCOSE, CAPILLARY
Glucose-Capillary: 56 mg/dL — ABNORMAL LOW (ref 65–99)
Glucose-Capillary: 130 mg/dL — ABNORMAL HIGH (ref 65–99)

## 2016-07-01 LAB — APTT: APTT: 32 s (ref 24–36)

## 2016-07-01 MED ORDER — LORATADINE 10 MG PO TABS
10.0000 mg | ORAL_TABLET | Freq: Every day | ORAL | Status: DC
  Administered 2016-07-02: 10 mg via ORAL
  Filled 2016-07-01: qty 1

## 2016-07-01 MED ORDER — HEPARIN BOLUS VIA INFUSION
3800.0000 [IU] | Freq: Once | INTRAVENOUS | Status: AC
  Administered 2016-07-02: 3800 [IU] via INTRAVENOUS
  Filled 2016-07-01: qty 3800

## 2016-07-01 MED ORDER — MAGNESIUM 400 MG PO CAPS: 400.0000 mg | ORAL_CAPSULE | Freq: Every day | ORAL | Status: DC

## 2016-07-01 MED ORDER — MAGNESIUM OXIDE 400 (241.3 MG) MG PO TABS
400.0000 mg | ORAL_TABLET | Freq: Every day | ORAL | Status: DC
  Administered 2016-07-02: 400 mg via ORAL
  Filled 2016-07-01: qty 1

## 2016-07-01 MED ORDER — MONTELUKAST SODIUM 10 MG PO TABS
10.0000 mg | ORAL_TABLET | Freq: Every day | ORAL | Status: DC
  Administered 2016-07-01: 10 mg via ORAL
  Filled 2016-07-01: qty 1

## 2016-07-01 MED ORDER — SODIUM CHLORIDE 0.9 % IV BOLUS (SEPSIS)
500.0000 mL | Freq: Once | INTRAVENOUS | Status: AC
  Administered 2016-07-01: 500 mL via INTRAVENOUS

## 2016-07-01 MED ORDER — POTASSIUM CHLORIDE 10 MEQ/100ML IV SOLN
10.0000 meq | Freq: Once | INTRAVENOUS | Status: AC
  Administered 2016-07-01: 10 meq via INTRAVENOUS
  Filled 2016-07-01: qty 100

## 2016-07-01 MED ORDER — ASPIRIN 325 MG PO TABS
325.0000 mg | ORAL_TABLET | Freq: Every day | ORAL | Status: DC
  Administered 2016-07-02: 325 mg via ORAL
  Filled 2016-07-01: qty 1

## 2016-07-01 MED ORDER — POTASSIUM CHLORIDE CRYS ER 20 MEQ PO TBCR
40.0000 meq | EXTENDED_RELEASE_TABLET | Freq: Once | ORAL | Status: AC
  Administered 2016-07-01: 40 meq via ORAL
  Filled 2016-07-01: qty 2

## 2016-07-01 MED ORDER — INSULIN ASPART 100 UNIT/ML ~~LOC~~ SOLN
0.0000 [IU] | Freq: Three times a day (TID) | SUBCUTANEOUS | Status: DC
  Administered 2016-07-02 (×2): 1 [IU] via SUBCUTANEOUS
  Filled 2016-07-01 (×2): qty 1

## 2016-07-01 MED ORDER — FLUOXETINE HCL 20 MG PO CAPS
40.0000 mg | ORAL_CAPSULE | Freq: Every day | ORAL | Status: DC
  Administered 2016-07-02: 40 mg via ORAL
  Filled 2016-07-01: qty 2

## 2016-07-01 MED ORDER — DILTIAZEM HCL ER COATED BEADS 180 MG PO CP24
180.0000 mg | ORAL_CAPSULE | Freq: Every day | ORAL | Status: DC
Start: 2016-07-02 — End: 2016-07-02
  Administered 2016-07-02: 180 mg via ORAL
  Filled 2016-07-01: qty 1

## 2016-07-01 MED ORDER — SODIUM CHLORIDE 0.9% FLUSH: 3.0000 mL | INTRAVENOUS | Status: DC | PRN

## 2016-07-01 MED ORDER — ASPIRIN 81 MG PO CHEW
324.0000 mg | CHEWABLE_TABLET | Freq: Once | ORAL | Status: AC
  Administered 2016-07-01: 324 mg via ORAL
  Filled 2016-07-01: qty 4

## 2016-07-01 MED ORDER — INSULIN ASPART 100 UNIT/ML ~~LOC~~ SOLN
10.0000 [IU] | Freq: Three times a day (TID) | SUBCUTANEOUS | Status: DC
  Administered 2016-07-02 (×2): 10 [IU] via SUBCUTANEOUS
  Filled 2016-07-01 (×2): qty 10

## 2016-07-01 MED ORDER — IOPAMIDOL (ISOVUE-370) INJECTION 76%
60.0000 mL | Freq: Once | INTRAVENOUS | Status: AC | PRN
  Administered 2016-07-01: 75 mL via INTRAVENOUS

## 2016-07-01 MED ORDER — HEPARIN (PORCINE) IN NACL 100-0.45 UNIT/ML-% IJ SOLN
750.0000 [IU]/h | INTRAMUSCULAR | Status: DC
  Administered 2016-07-02: 750 [IU]/h via INTRAVENOUS
  Filled 2016-07-01: qty 250

## 2016-07-01 MED ORDER — POTASSIUM CHLORIDE CRYS ER 20 MEQ PO TBCR
EXTENDED_RELEASE_TABLET | ORAL | Status: AC
  Filled 2016-07-01: qty 1

## 2016-07-01 MED ORDER — SODIUM CHLORIDE 0.9% FLUSH: 3.0000 mL | Freq: Two times a day (BID) | INTRAVENOUS | Status: DC

## 2016-07-01 MED ORDER — SODIUM CHLORIDE 0.9% FLUSH
3.0000 mL | Freq: Two times a day (BID) | INTRAVENOUS | Status: DC
  Administered 2016-07-01: 3 mL via INTRAVENOUS

## 2016-07-01 MED ORDER — LOSARTAN POTASSIUM 50 MG PO TABS
100.0000 mg | ORAL_TABLET | Freq: Every day | ORAL | Status: DC
  Administered 2016-07-02: 100 mg via ORAL
  Filled 2016-07-01: qty 2

## 2016-07-01 MED ORDER — INSULIN GLARGINE 100 UNIT/ML ~~LOC~~ SOLN
12.0000 [IU] | Freq: Every day | SUBCUTANEOUS | Status: DC
  Filled 2016-07-01 (×2): qty 0.12

## 2016-07-01 MED ORDER — SODIUM CHLORIDE 0.9 % IV SOLN: 250.0000 mL | INTRAVENOUS | Status: DC | PRN

## 2016-07-01 MED ORDER — POTASSIUM CHLORIDE 20 MEQ PO PACK
20.0000 meq | PACK | Freq: Every day | ORAL | Status: DC
  Administered 2016-07-02: 20 meq via ORAL
  Filled 2016-07-01: qty 1

## 2016-07-01 MED ORDER — TRAZODONE HCL 50 MG PO TABS
50.0000 mg | ORAL_TABLET | Freq: Every day | ORAL | Status: DC
  Administered 2016-07-01: 50 mg via ORAL
  Filled 2016-07-01: qty 1

## 2016-07-01 MED ORDER — VITAMIN E 45 MG (100 UNIT) PO CAPS
1000.0000 [IU] | ORAL_CAPSULE | Freq: Every day | ORAL | Status: DC
  Administered 2016-07-02: 1000 [IU] via ORAL
  Filled 2016-07-01 (×3): qty 2

## 2016-07-01 MED ORDER — ALPRAZOLAM 0.5 MG PO TABS: 0.2500 mg | ORAL_TABLET | Freq: Every evening | ORAL | Status: DC | PRN

## 2016-07-01 MED ORDER — INSULIN ASPART 100 UNIT/ML ~~LOC~~ SOLN: 0.0000 [IU] | Freq: Every day | SUBCUTANEOUS | Status: DC

## 2016-07-01 MED ORDER — VITAMIN C 500 MG PO TABS
1000.0000 mg | ORAL_TABLET | Freq: Every day | ORAL | Status: DC
  Administered 2016-07-02: 1000 mg via ORAL
  Filled 2016-07-01: qty 2

## 2016-07-01 NOTE — Telephone Encounter (Signed)
Pt reports she does "not feel right", and "has a funny feeling" for a couple days. States she could not explain her sx, but then went on to explain left sided chest pain, back pain, rapid heart rate, and nausea. Advised pt to go to ER for possible MI, pt refused. AMA, pt kept appointment at 10:00 tomorrow with you. Again, I urged the pt to go to ED. Pt then said she would go to ED if she starts to feels worse. Did agree to call 911 if sx worsen. Renaldo Fiddler, CMA

## 2016-07-01 NOTE — Progress Notes (Signed)
ANTICOAGULATION CONSULT NOTE - Initial Consult  Pharmacy Consult for heparin drip Indication: ACS/STEMI  Allergies  Allergen Reactions  . Neomycin-Bacitracin Zn-Polymyx Swelling  . Benzalkonium Chloride Itching and Swelling  . Ibuprofen     Other reaction(s): Dizziness Heart fluttering. Tachycardia. Tachycardia.  . Valdecoxib Nausea And Vomiting  . Albuterol Rash  . Latex Rash  . Tape Rash    blisters  . Triamcinolone Rash    Patient Measurements: Height: 5\' 2"  (157.5 cm) Weight: 150 lb (68 kg) IBW/kg (Calculated) : 50.1 Heparin Dosing Weight: 64kg  Vital Signs: Temp: 97.9 F (36.6 C) (10/05 2259) Temp Source: Oral (10/05 2259) BP: 153/61 (10/05 2259) Pulse Rate: 71 (10/05 2259)  Labs:  Recent Labs  07/01/16 1656 07/01/16 2016  HGB 13.1  --   HCT 39.5  --   PLT 306  --   APTT 32  --   LABPROT 12.4  --   INR 0.92  --   CREATININE 1.12*  --   TROPONINI <0.03 <0.03    Estimated Creatinine Clearance: 33.2 mL/min (by C-G formula based on SCr of 1.12 mg/dL (H)).   Medical History: Past Medical History:  Diagnosis Date  . Angina pectoris ([place])   . Atrial fibrillation ([place])   . Cervicalgia   . Chronic back pain   . COPD (chronic obstructive pulmonary disease) ([place])   . DDD (degenerative disc disease), lumbar   . Diabetes mellitus without complication ([place])   . Dysphagia   . Hyperlipemia   . Hypertension   . Vulvovaginitis     Medications:  No anticoagulation in PTA meds.  Assessment:  Goal of Therapy:  Heparin level 0.3-0.7 units/ml Monitor platelets by anticoagulation protocol: Yes   Plan:  3800 unit bolus and initial rate of 750 units/hr. First heparin level 8 hours after start of infusion.  Colon Rueth S 07/01/2016,11:59 PM

## 2016-07-01 NOTE — Telephone Encounter (Signed)
Pt called back to cancel appt for tomorrow 07/02/16 and stated she is going to the ER. Thanks TNP

## 2016-07-01 NOTE — ED Notes (Signed)
Report given to Rachel, RN.

## 2016-07-01 NOTE — ED Provider Notes (Signed)
[facility] Emergency Department Provider Note  ____________________________________________  Time seen: Approximately 6:45 PM  I have reviewed the triage vital signs and the nursing notes.   HISTORY  Chief Complaint Chest Pain   HPI [name] is a 82 y.o. female history of angina, atrial fibrillation, DVT, COPD, diabetes, hypertension, hyperlipidemia who presents for evaluation of chest pain. Patient reports over the course of the last 3 days she has had multiple episodes of chest tightness. She reports that these episodes happen at rest and with exertion. She describes it as a tightness, located substernally, radiating to her back, associated with nausea and dizziness. She endorses shortness of breath however that is chronic for her. No changes in her chronic cough. She reports 2-3 episodes a day for the last 3 days. Patient's last stress test was in 2015. Patient not anticoagulated. NO CP at this time. Last episode of chest pain was this morning.   Past Medical History:  Diagnosis Date  . Angina pectoris ([place])   . Atrial fibrillation ([place])   . Cervicalgia   . Chronic back pain   . COPD (chronic obstructive pulmonary disease) ([place])   . DDD (degenerative disc disease), lumbar   . Diabetes mellitus without complication ([place])   . Dysphagia   . Hyperlipemia   . Hypertension   . Vulvovaginitis     Patient Active Problem List   Diagnosis Date Noted  . Chest pain 07/01/2016  . Hypokalemia 09/03/2015  . Insomnia 07/23/2015  . Headache 07/23/2015  . Type 2 diabetes mellitus ([place]) 06/24/2015  . Chronic vulvitis 06/02/2015  . Allergic rhinitis 05/30/2015  . Absolute anemia 05/30/2015  . Back ache 05/30/2015  . Body mass index (BMI) of 29.0-29.9 in adult 05/30/2015  . Edema extremities 05/30/2015  . Dilatation of esophagus 05/30/2015  . Fall 05/30/2015  . H/O deep venous thrombosis 05/30/2015  . Hypercholesteremia 05/30/2015  . Heart &  renal disease, hypertensive, with heart failure ([place]) 05/30/2015  . Below normal amount of sodium in the blood 05/30/2015  . Calculus of kidney 05/30/2015  . Gonalgia 05/30/2015  . Psoriasis 05/30/2015  . Avitaminosis D 05/30/2015  . Cough 04/01/2015  . Combined fat and carbohydrate induced hyperlipemia 12/30/2014  . Paroxysmal atrial fibrillation ([place]) 07/10/2014  . Abnormal gait 07/08/2014  . Anxiety state 07/08/2014  . Chronic kidney disease 07/08/2014  . Acid reflux 07/08/2014  . 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[place]) 07/08/2014  . Chronic obstructive pulmonary disease ([place]) 07/08/2014  . Benign essential HTN 06/26/2014  . Carotid artery narrowing 06/26/2014  . Intermittent claudication ([place]) 06/26/2014  . Basal cell carcinoma of face 05/01/2014  . Disease of female genital organs 11/28/2012  . Incomplete bladder emptying 09/05/2012  . Excessive urination at night 08/15/2012  . Basal cell carcinoma of ear 10/26/2011    Past Surgical History:  Procedure Laterality Date  . gall bladder    . melanoma     removal neck and back  . PERIPHERAL VASCULAR CATHETERIZATION Left 01/05/2016   Procedure: Lower Extremity Angiography;  Surgeon: Algernon Huxley, MD;  Location: [place] [facility];  Service: Cardiovascular;  Laterality: Left;  . PERIPHERAL VASCULAR CATHETERIZATION  01/05/2016   Procedure: Lower Extremity Intervention;  Surgeon: Algernon Huxley, MD;  Location: [place] [facility];  Service: Cardiovascular;;  . ureterolithiasis     calculus removed  . VAGINAL HYSTERECTOMY    . VULVA / PERINEUM BIOPSY  05/29/2015    Prior to Admission medications   Medication  Sig Start Date End Date Taking? Authorizing Provider  acetaminophen (TYLENOL) 500 MG tablet Take 1,000 mg by mouth every 6 (six) hours as needed.   Yes Historical Provider, MD  ALPRAZolam Duanne Moron) 0.25 MG tablet Take 0.25 mg by mouth at bedtime as needed for anxiety.   Yes Historical Provider, MD  Ascorbic Acid (VITAMIN C) 1000 MG  tablet Take 1,000 mg by mouth daily.    Yes Historical Provider, MD  aspirin 325 MG tablet Take 325 mg by mouth daily. Reported on 01/05/2016   Yes Historical Provider, MD  benzonatate (TESSALON) 100 MG capsule Take by mouth 3 (three) times daily as needed for cough.   Yes Historical Provider, MD  benzonatate (TESSALON) 100 MG capsule TAKE 1 CAPSULE 3 TIMES DAILY AS NEEDED FOR COUGH 06/28/16  Yes Richard Maceo Pro., MD  cetirizine (ZYRTEC) 10 MG tablet TAKE ONE TABLET BY MOUTH EVERY DAY 06/28/16  Yes Jerrol Banana., MD  diltiazem [facility] CD) 180 MG 24 hr capsule TAKE 1 CAPSULE EVERY DAY 03/29/16  Yes Jerrol Banana., MD  FLUoxetine (PROZAC) 40 MG capsule 1 tablet daily 06/17/16  Yes Richard Maceo Pro., MD  Insulin Glargine (LANTUS SOLOSTAR) 100 UNIT/ML Solostar Pen INJECT 9 UNITS SUBQ AT BEDTIME 12/27/14  Yes Historical Provider, MD  insulin lispro (HUMALOG KWIKPEN) 100 UNIT/ML KiwkPen Inject into the skin. 12 units in the am, 10 units at lunch and 12 units in the pm   Yes Historical Provider, MD  losartan-hydrochlorothiazide (HYZAAR) 100-25 MG tablet Take 1 tablet by mouth daily. 06/16/16  Yes Richard Maceo Pro., MD  Magnesium 400 MG CAPS Take by mouth daily.   Yes Historical Provider, MD  montelukast (SINGULAIR) 10 MG tablet TAKE ONE TABLET BY MOUTH AT BEDTIME 04/23/16  Yes Richard Maceo Pro., MD  Multiple Vitamins-Minerals (ICAPS AREDS 2 PO) Take by mouth.   Yes Historical Provider, MD  naproxen sodium (ANAPROX) 220 MG tablet Take 220 mg by mouth 2 (two) times daily as needed.    Yes Historical Provider, MD  pantoprazole (PROTONIX) 40 MG tablet TAKE ONE TABLET (40 MG) BY MOUTH EVERY DAY 03/29/16  Yes Jerrol Banana., MD  Potassium 99 MG TABS Take by mouth.   Yes Historical Provider, MD  traZODone (DESYREL) 50 MG tablet Take 50 mg by mouth at bedtime.   Yes Historical Provider, MD  vitamin E 400 UNIT capsule Take 1,000 Units by mouth daily.   Yes Historical Provider, MD    ASSURE COMFORT LANCETS 30G [place]  08/01/15   Historical Provider, MD  Blood Glucose Calibration (ACCU-CHEK SMARTVIEW CONTROL) LIQD  07/30/15   Historical Provider, MD  glucose blood test strip ACCU-CHEK AVIVA PLUS TEST STRP Use to check sugars 3 times daily. 10/28/15   Historical Provider, MD  [place] X 5 MM [place]  07/30/15   Historical Provider, MD    Allergies Neomycin-bacitracin zn-polymyx; Benzalkonium chloride; Ibuprofen; Valdecoxib; Albuterol; Latex; Tape; and Triamcinolone  Family History  Problem Relation Age of Onset  . Ovarian cancer Other   . Diabetes Sister   . Colon cancer Brother   . Diabetes Brother   . Breast cancer Neg Hx     Social History Social History  Substance Use Topics  . Smoking status: Never Smoker  . Smokeless tobacco: Never Used  . Alcohol use No    Review of Systems  Constitutional: Negative for fever. Eyes: Negative for visual changes. ENT: Negative for sore throat.  Cardiovascular: + chest pain. Respiratory: + shortness of breath and cough Gastrointestinal: Negative for abdominal pain, vomiting or diarrhea. Genitourinary: Negative for dysuria. Musculoskeletal: Negative for back pain. Skin: Negative for rash. Neurological: Negative for headaches, weakness or numbness.  ____________________________________________   PHYSICAL EXAM:  VITAL SIGNS: ED Triage Vitals  Enc Vitals Group     BP 07/01/16 1652 (!) 134/59     Pulse Rate 07/01/16 1652 88     Resp 07/01/16 1652 20     Temp 07/01/16 1652 98.9 F (37.2 C)     Temp Source 07/01/16 1652 Oral     SpO2 07/01/16 1652 95 %     Weight 07/01/16 1654 150 lb (68 kg)     Height 07/01/16 1654 5\' 2"  (1.575 m)     Head Circumference --      Peak Flow --      Pain Score 07/01/16 1654 8     Pain Loc --      Pain Edu? --      Excl. in [place]? --     Constitutional: Alert and oriented. Well appearing and in no apparent distress. HEENT:      Head: Normocephalic and atraumatic.          Eyes: Conjunctivae are normal. Sclera is non-icteric. EOMI. PERRL      Mouth/Throat: Mucous membranes are moist.       Neck: Supple with no signs of meningismus. Cardiovascular: Regular rate and rhythm. No murmurs, gallops, or rubs. 2+ symmetrical distal pulses are present in all extremities. No JVD. Respiratory: Normal respiratory effort. Decreased air movement on the R side. No wheezes, crackles, or rhonchi.  Gastrointestinal: Soft, non tender, and non distended with positive bowel sounds. No rebound or guarding. Genitourinary: No CVA tenderness. Musculoskeletal: Nontender with normal range of motion in all extremities. No edema, cyanosis, or erythema of extremities. Neurologic: Normal speech and language. Face is symmetric. Moving all extremities. No gross focal neurologic deficits are appreciated. Skin: Skin is warm, dry and intact. No rash noted. Psychiatric: Mood and affect are normal. Speech and behavior are normal.  ____________________________________________   LABS (all labs ordered are listed, but only abnormal results are displayed)  Labs Reviewed  BASIC METABOLIC PANEL - Abnormal; Notable for the following:       Result Value   Sodium 133 (*)    Potassium 2.6 (*)    Chloride 92 (*)    Glucose, Bld 138 (*)    Creatinine, Ser 1.12 (*)    GFR calc non Af Amer 44 (*)    GFR calc Af Amer 50 (*)    All other components within normal limits  CBC - Abnormal; Notable for the following:    WBC 12.0 (*)    RDW 15.3 (*)    All other components within normal limits  GLUCOSE, CAPILLARY - Abnormal; Notable for the following:    Glucose-Capillary 56 (*)    All other components within normal limits  GLUCOSE, CAPILLARY - Abnormal; Notable for the following:    Glucose-Capillary 130 (*)    All other components within normal limits  TROPONIN I  TROPONIN I  MAGNESIUM  TROPONIN I  TROPONIN I  TROPONIN I  PROTIME-INR  APTT    ____________________________________________  EKG  ED ECG REPORT I, Rudene Re, the attending physician, personally viewed and interpreted this ECG.  Normal sinus rhythm, rate of 92, normal intervals, normal axis, no ST elevations or depressions, diffuse T-wave flattening. Unchanged from prior ____________________________________________  RADIOLOGY  CXR:  1. Negative chest x-ray. 2. Aortic Atherosclerosis (ICD10-170.0)  CTA chest: No pulmonary embolus or other acute thoracic abnormality. ____________________________________________   PROCEDURES  Procedure(s) performed: None Procedures Critical Care performed:  None ____________________________________________   INITIAL IMPRESSION / ASSESSMENT AND PLAN / ED COURSE  82 y.o. female history of angina, atrial fibrillation, DVT, COPD, diabetes, hypertension, hyperlipidemia who presents for evaluation of altered level episodes of chest tightness associated with dizziness and nausea concerning for ACS. Last episode was this morning. EKG is unchanged from prior. First troponin is negative. We'll pursue a CT scan to rule out PE as patient has a history of DVT in the past and is not on any anticoagulants. Plan for admission.  Clinical Course  Comment By Time  Troponin 1 negative. EKG unchanged from prior. CT negative for for PE. We'll get patient admitted for chest pain evaluation. Rudene Re, MD 10/05 2047    Pertinent labs & imaging results that were available during my care of the patient were reviewed by me and considered in my medical decision making (see chart for details).    ____________________________________________   FINAL CLINICAL IMPRESSION(S) / ED DIAGNOSES  Final diagnoses:  Chest pain, unspecified type  Hypokalemia      NEW MEDICATIONS STARTED DURING THIS VISIT:  Current Discharge Medication List       Note:  This document was prepared using Dragon voice recognition software and may  include unintentional dictation errors.    Rudene Re, MD 07/01/16 2330

## 2016-07-01 NOTE — H&P (Signed)
[name] is an 82 y.o. female.   Chief Complaint: Chest pain HPI: Started having chest pain radiating to left side and back. Shortness of breath associated with episodes. Has had 3 episodes today lasting about 30 min each. Had stress test 2 years ago.  Past Medical History:  Diagnosis Date  . Angina pectoris ([place])   . Atrial fibrillation ([place])   . Cervicalgia   . Chronic back pain   . COPD (chronic obstructive pulmonary disease) ([place])   . DDD (degenerative disc disease), lumbar   . Diabetes mellitus without complication ([place])   . Dysphagia   . Hyperlipemia   . Hypertension   . Vulvovaginitis     Past Surgical History:  Procedure Laterality Date  . gall bladder    . melanoma     removal neck and back  . PERIPHERAL VASCULAR CATHETERIZATION Left 01/05/2016   Procedure: Lower Extremity Angiography;  Surgeon: Algernon Huxley, MD;  Location: [place] [facility];  Service: Cardiovascular;  Laterality: Left;  . PERIPHERAL VASCULAR CATHETERIZATION  01/05/2016   Procedure: Lower Extremity Intervention;  Surgeon: Algernon Huxley, MD;  Location: [place] [facility];  Service: Cardiovascular;;  . ureterolithiasis     calculus removed  . VAGINAL HYSTERECTOMY    . VULVA / PERINEUM BIOPSY  05/29/2015    Family History  Problem Relation Age of Onset  . Ovarian cancer Other   . Diabetes Sister   . Colon cancer Brother   . Diabetes Brother   . Breast cancer Neg Hx    Social History:  reports that she has never smoked. She has never used smokeless tobacco. She reports that she does not drink alcohol or use drugs.  Allergies:  Allergies  Allergen Reactions  . Neomycin-Bacitracin Zn-Polymyx Swelling  . Benzalkonium Chloride Itching and Swelling  . Ibuprofen     Other reaction(s): Dizziness Heart fluttering. Tachycardia. Tachycardia.  . Valdecoxib Nausea And Vomiting  . Albuterol Rash  . Latex Rash  . Tape Rash    blisters  . Triamcinolone Rash     (Not in a hospital  admission)  Results for orders placed or performed during the hospital encounter of 07/01/16 (from the past 48 hour(s))  Basic metabolic panel     Status: Abnormal   Collection Time: 07/01/16  4:56 PM  Result Value Ref Range   Sodium 133 (L) 135 - 145 mmol/L   Potassium 2.6 (LL) 3.5 - 5.1 mmol/L    Comment: CRITICAL RESULT CALLED TO, READ BACK BY AND VERIFIED WITH HEATHER FISHER RN AT 6712 07/01/16 MSS.    Chloride 92 (L) 101 - 111 mmol/L   CO2 31 22 - 32 mmol/L   Glucose, Bld 138 (H) 65 - 99 mg/dL   BUN 18 6 - 20 mg/dL   Creatinine, Ser 1.12 (H) 0.44 - 1.00 mg/dL   Calcium 9.2 8.9 - 10.3 mg/dL   GFR calc non Af Amer 44 (L) >60 mL/min   GFR calc Af Amer 50 (L) >60 mL/min    Comment: (NOTE) The eGFR has been calculated using the CKD EPI equation. This calculation has not been validated in all clinical situations. eGFR's persistently <60 mL/min signify possible Chronic Kidney Disease.    Anion gap 10 5 - 15  CBC     Status: Abnormal   Collection Time: 07/01/16  4:56 PM  Result Value Ref Range   WBC 12.0 (H) 3.6 - 11.0 K/uL   RBC 4.81 3.80 - 5.20 MIL/uL  Hemoglobin 13.1 12.0 - 16.0 g/dL   HCT 39.5 35.0 - 47.0 %   MCV 82.0 80.0 - 100.0 fL   MCH 27.3 26.0 - 34.0 pg   MCHC 33.3 32.0 - 36.0 g/dL   RDW 15.3 (H) 11.5 - 14.5 %   Platelets 306 150 - 440 K/uL  Troponin I     Status: None   Collection Time: 07/01/16  4:56 PM  Result Value Ref Range   Troponin I <0.03 <0.03 ng/mL  Glucose, capillary     Status: Abnormal   Collection Time: 07/01/16  7:25 PM  Result Value Ref Range   Glucose-Capillary 56 (L) 65 - 99 mg/dL  Glucose, capillary     Status: Abnormal   Collection Time: 07/01/16  8:15 PM  Result Value Ref Range   Glucose-Capillary 130 (H) 65 - 99 mg/dL  Troponin I     Status: None   Collection Time: 07/01/16  8:16 PM  Result Value Ref Range   Troponin I <0.03 <0.03 ng/mL   Dg Chest 2 View  Result Date: 07/01/2016 CLINICAL DATA:  82 year old female with left-sided  chest pain shortness of breath for the past 2-3 days EXAM: CHEST  2 VIEW COMPARISON:  Prior chest x-ray 04/29/2016 FINDINGS: Cardiac and mediastinal contours are within normal limits. Atherosclerotic calcifications again noted in the transverse aorta. The lungs are clear. No evidence of pneumothorax. Surgical clips in a right upper quadrant suggest prior cholecystectomy. Osseous structures are intact and unremarkable. IMPRESSION: 1. Negative chest x-ray. 2.  Aortic Atherosclerosis (ICD10-170.0) Electronically Signed   By: Jacqulynn Cadet M.D.   On: 07/01/2016 17:23   Ct Angio Chest Pe W And/or Wo Contrast  Result Date: 07/01/2016 CLINICAL DATA:  Chest pain and shortness of breath for 3 days. Recent bronchitis. EXAM: CT ANGIOGRAPHY CHEST WITH CONTRAST TECHNIQUE: Multidetector CT imaging of the chest was performed using the standard protocol during bolus administration of intravenous contrast. Multiplanar CT image reconstructions and MIPs were obtained to evaluate the vascular anatomy. CONTRAST:  75 mL Isovue 370 IV COMPARISON:  Chest radiograph 07/01/2016 FINDINGS: Cardiovascular: There is satisfactory opacification of the pulmonary arteries to the segmental level. There is no pulmonary embolus. The main pulmonary artery is within normal limits for size, measuring 2.4 cm at the bifurcation. There is no CT evidence of acute right heart strain. There is atherosclerotic calcification within the thoracic aorta. There is a bovine variant branching pattern, with a shared origin of the brachiocephalic and left common carotid arteries. Heart size is normal, without pericardial effusion. Mediastinum/Nodes: No mediastinal, hilar or axillary lymphadenopathy. The visualized thyroid and thoracic esophageal course are unremarkable. Lungs/Pleura: No pulmonary nodules or masses. No pleural effusion or pneumothorax. No focal airspace consolidation. No focal pleural abnormality. Upper Abdomen: Contrast bolus timing is not  optimized for evaluation of the abdominal organs. Within this limitation, the visualized organs of the upper abdomen are normal. Musculoskeletal: No bony spinal canal stenosis. No lytic or blastic lesions. T11 vertebral body hemangioma. Review of the MIP images confirms the above findings. IMPRESSION: No pulmonary embolus or other acute thoracic abnormality. Electronically Signed   By: Ulyses Jarred M.D.   On: 07/01/2016 20:13    Review of Systems  Constitutional: Negative for fever.  Eyes: Negative for blurred vision.  Respiratory: Positive for shortness of breath.   Cardiovascular: Positive for chest pain.  Gastrointestinal: Negative for nausea and vomiting.  Genitourinary: Negative for dysuria.  Musculoskeletal: Negative for joint pain.  Skin: Negative for rash.  Neurological: Negative for sensory change and headaches.    Blood pressure (!) 145/68, pulse 71, temperature 98.9 F (37.2 C), temperature source Oral, resp. rate 15, height 5' 2" (1.575 m), weight 68 kg (150 lb), SpO2 96 %. Physical Exam  Constitutional: She is oriented to person, place, and time. She appears well-developed and well-nourished. No distress.  HENT:  Head: Normocephalic and atraumatic.  Mouth/Throat: Oropharynx is clear and moist. No oropharyngeal exudate.  Eyes: Pupils are equal, round, and reactive to light.  Neck: No JVD present. No thyromegaly present.  Cardiovascular: Normal rate and regular rhythm.   No murmur heard. Respiratory: Effort normal and breath sounds normal. She has no wheezes.  GI: Soft. Bowel sounds are normal. There is no tenderness.  Musculoskeletal: Normal range of motion. She exhibits no edema.  Neurological: She is alert and oriented to person, place, and time. No cranial nerve deficit. Coordination normal.  Skin: Skin is warm. No erythema.     Assessment/Plan 1. Chest Pain: Suspect ACS. Has many risk factors. ASA, b-blocker and heparin. Consult cardiology for stress vs cath. 2.  Hypokalemia: Repleat. Check Mag. Hold HCTZ. 3. PVD: History of stents. 4. DM: Continue insulin.  Time spent= 35 min  Baxter Hire, MD 07/01/2016, 9:41 PM

## 2016-07-01 NOTE — ED Notes (Signed)
Dropped 0.5 tab, replaced that from pyxis

## 2016-07-01 NOTE — ED Triage Notes (Signed)
Pt to triage via wheelchair.  Pt reports chest pain and sob for 3 days.  Recent bronchitis.  No n/v/  No diaphoresis.  Pt alert.  Speech clear.

## 2016-07-01 NOTE — ED Notes (Signed)
This RN called to bedside by patient. Pt states she "feels like her heart is running away in her chest" and patient is noted to be diaphoretic and flushed. MD notified, 2nd EKG obtained at this time, pt continues to be in NSR. Will continue to monitor.

## 2016-07-02 ENCOUNTER — Ambulatory Visit: Payer: Medicare Other | Admitting: Physician Assistant

## 2016-07-02 ENCOUNTER — Observation Stay: Payer: Medicare Other

## 2016-07-02 LAB — NM MYOCAR MULTI W/SPECT W/WALL MOTION / EF
CHL CUP MPHR: 135 {beats}/min
CSEPED: 1 min
CSEPHR: 77 %
Estimated workload: 1 METS
Exercise duration (sec): 1 s
LVDIAVOL: 52 mL (ref 46–106)
LVSYSVOL: 23 mL
Peak HR: 105 {beats}/min
Rest HR: 75 {beats}/min
SDS: 12
SRS: 2
SSS: 12
TID: 0.64

## 2016-07-02 LAB — CBC
HEMATOCRIT: 36 % (ref 35.0–47.0)
Hemoglobin: 12.5 g/dL (ref 12.0–16.0)
MCH: 28.4 pg (ref 26.0–34.0)
MCHC: 34.8 g/dL (ref 32.0–36.0)
MCV: 81.5 fL (ref 80.0–100.0)
PLATELETS: 267 10*3/uL (ref 150–440)
RBC: 4.42 MIL/uL (ref 3.80–5.20)
RDW: 15.4 % — AB (ref 11.5–14.5)
WBC: 9.2 10*3/uL (ref 3.6–11.0)

## 2016-07-02 LAB — HEPARIN LEVEL (UNFRACTIONATED): Heparin Unfractionated: 0.18 IU/mL — ABNORMAL LOW (ref 0.30–0.70)

## 2016-07-02 LAB — TROPONIN I: Troponin I: <0.03 ng/mL (ref <0.03)

## 2016-07-02 LAB — MAGNESIUM
MAGNESIUM: 1.6 mg/dL — AB (ref 1.7–2.4)
MAGNESIUM: 1.6 mg/dL — AB (ref 1.7–2.4)

## 2016-07-02 LAB — GLUCOSE, CAPILLARY
GLUCOSE-CAPILLARY: 190 mg/dL — AB (ref 65–99)
Glucose-Capillary: 141 mg/dL — ABNORMAL HIGH (ref 65–99)
Glucose-Capillary: 142 mg/dL — ABNORMAL HIGH (ref 65–99)
Glucose-Capillary: 156 mg/dL — ABNORMAL HIGH (ref 65–99)

## 2016-07-02 LAB — POTASSIUM: POTASSIUM: 3.6 mmol/L (ref 3.5–5.1)

## 2016-07-02 MED ORDER — INSULIN GLARGINE 100 UNIT/ML ~~LOC~~ SOLN: 5.0000 [IU] | Freq: Every day | SUBCUTANEOUS | 11 refills | Status: DC

## 2016-07-02 MED ORDER — REGADENOSON 0.4 MG/5ML IV SOLN
0.4000 mg | Freq: Once | INTRAVENOUS | Status: AC
  Administered 2016-07-02: 0.4 mg via INTRAVENOUS
  Filled 2016-07-02: qty 5

## 2016-07-02 MED ORDER — TECHNETIUM TC 99M TETROFOSMIN IV KIT
13.0000 | PACK | Freq: Once | INTRAVENOUS | Status: AC | PRN
  Administered 2016-07-02: 12.54 via INTRAVENOUS

## 2016-07-02 MED ORDER — TECHNETIUM TC 99M TETROFOSMIN IV KIT
33.0000 | PACK | Freq: Once | INTRAVENOUS | Status: AC | PRN
  Administered 2016-07-02: 32.967 via INTRAVENOUS

## 2016-07-02 MED ORDER — HEPARIN (PORCINE) IN NACL 100-0.45 UNIT/ML-% IJ SOLN: 950.0000 [IU]/h | INTRAMUSCULAR | Status: DC

## 2016-07-02 MED ORDER — ACETAMINOPHEN 325 MG PO TABS
650.0000 mg | ORAL_TABLET | ORAL | Status: DC | PRN
  Administered 2016-07-02: 650 mg via ORAL
  Filled 2016-07-02: qty 2

## 2016-07-02 MED ORDER — MAGNESIUM SULFATE 2 GM/50ML IV SOLN
2.0000 g | Freq: Once | INTRAVENOUS | Status: DC
  Filled 2016-07-02: qty 50

## 2016-07-02 MED ORDER — HEPARIN BOLUS VIA INFUSION: 2000.0000 [IU] | Freq: Once | INTRAVENOUS | Status: DC

## 2016-07-02 NOTE — Progress Notes (Signed)
Notified Dr Marcille Blanco via text of mg 1.6

## 2016-07-02 NOTE — Progress Notes (Signed)
Between PRACTICE  SUBJECTIVE: No further chest pain or arrhythmia   Vitals:   07/01/16 2219 07/01/16 2259 07/02/16 0420 07/02/16 1118  BP: (!) 159/72 (!) 153/61 (!) 149/59 (!) 161/68  Pulse: 94 71 69 78  Resp: 18 15 16 17   Temp:  97.9 F (36.6 C) 98 F (36.7 C) 98.1 F (36.7 C)  TempSrc:  Oral    SpO2: 95% 95% 96% 95%  Weight:      Height:        Intake/Output Summary (Last 24 hours) at 07/02/16 1305 Last data filed at 07/02/16 0900  Gross per 24 hour  Intake           645.75 ml  Output              500 ml  Net           145.75 ml    LABS: Basic Metabolic Panel:  Recent Labs  07/01/16 1656 07/01/16 2306  NA 133*  --   K 2.6*  --   CL 92*  --   CO2 31  --   GLUCOSE 138*  --   BUN 18  --   CREATININE 1.12*  --   CALCIUM 9.2  --   MG  --  1.6*   Liver Function Tests: No results for input(s): AST, ALT, ALKPHOS, BILITOT, PROT, ALBUMIN in the last 72 hours. No results for input(s): LIPASE, AMYLASE in the last 72 hours. CBC:  Recent Labs  07/01/16 1656 07/02/16 1042  WBC 12.0* 9.2  HGB 13.1 12.5  HCT 39.5 36.0  MCV 82.0 81.5  PLT 306 267   Cardiac Enzymes:  Recent Labs  07/01/16 2306 07/02/16 0500 07/02/16 1042  TROPONINI <0.03 <0.03 <0.03   BNP: Invalid input(s): POCBNP D-Dimer: No results for input(s): DDIMER in the last 72 hours. Hemoglobin A1C: No results for input(s): HGBA1C in the last 72 hours. Fasting Lipid Panel: No results for input(s): CHOL, HDL, LDLCALC, TRIG, CHOLHDL, LDLDIRECT in the last 72 hours. Thyroid Function Tests: No results for input(s): TSH, T4TOTAL, T3FREE, THYROIDAB in the last 72 hours.  Invalid input(s): FREET3 Anemia Panel: No results for input(s): VITAMINB12, FOLATE, FERRITIN, TIBC, IRON, RETICCTPCT in the last 72 hours.   Physical Exam: Blood pressure (!) 161/68, pulse 78, temperature 98.1 F (36.7 C), resp. rate 17, height 5\' 2"  (1.575 m), weight 68 kg (150 lb), SpO2 95 %.    Wt Readings from Last 1 Encounters:  07/01/16 68 kg (150 lb)     General appearance: alert and cooperative Resp: clear to auscultation bilaterally Cardio: regular rate and rhythm GI: soft, non-tender; bowel sounds normal; no masses,  no organomegaly Extremities: extremities normal, atraumatic, no cyanosis or edema Pulses: 2+ and symmetric Neurologic: Grossly normal  TELEMETRY: Reviewed telemetry pt in nsr with rate of 86:  ASSESSMENT AND PLAN:  Active Problems:   Chest pain-ruled out for an mi. Functional study done today revealed no ischemia with normal lv function. Etiology of her chest pain may be due to transient afib with rvr. K was low. Would continue with current regimen and including potassium replacement to keep k greater than 4. Continue with diltiazem at 180 mg daily. OK for discharge today if K is greater than 3.5.     Teodoro Spray., MD, [facility] 07/02/2016 1:05 PM

## 2016-07-02 NOTE — Progress Notes (Signed)
ANTICOAGULATION CONSULT NOTE - Initial Consult  Pharmacy Consult for heparin drip Indication: ACS/STEMI  Allergies  Allergen Reactions  . Neomycin-Bacitracin Zn-Polymyx Swelling  . Benzalkonium Chloride Itching and Swelling  . Ibuprofen     Other reaction(s): Dizziness Heart fluttering. Tachycardia. Tachycardia.  . Valdecoxib Nausea And Vomiting  . Albuterol Rash  . Latex Rash  . Tape Rash    blisters  . Triamcinolone Rash   Patient Measurements: Height: 5\' 2"  (157.5 cm) Weight: 150 lb (68 kg) IBW/kg (Calculated) : 50.1 Heparin Dosing Weight: 64kg  Vital Signs: Temp: 98.1 F (36.7 C) (10/06 1118) BP: 161/68 (10/06 1118) Pulse Rate: 78 (10/06 1118)  Labs:  Recent Labs  07/01/16 1656  07/01/16 2306 07/02/16 0500 07/02/16 1042 07/02/16 1511  HGB 13.1  --   --   --  12.5  --   HCT 39.5  --   --   --  36.0  --   PLT 306  --   --   --  267  --   APTT 32  --   --   --   --   --   LABPROT 12.4  --   --   --   --   --   INR 0.92  --   --   --   --   --   HEPARINUNFRC  --   --   --   --  0.18* <0.10*  CREATININE 1.12*  --   --   --   --   --   TROPONINI <0.03  < > <0.03 <0.03 <0.03  --   < > = values in this interval not displayed.  Estimated Creatinine Clearance: 33.2 mL/min (by C-G formula based on SCr of 1.12 mg/dL (H)).   Medical History: Past Medical History:  Diagnosis Date  . Angina pectoris ([place])   . Atrial fibrillation ([place])   . Cervicalgia   . Chronic back pain   . COPD (chronic obstructive pulmonary disease) ([place])   . DDD (degenerative disc disease), lumbar   . Diabetes mellitus without complication ([place])   . Dysphagia   . Hyperlipemia   . Hypertension   . Vulvovaginitis     Medications:  No anticoagulation in PTA meds.  Assessment:  Goal of Therapy:  Heparin level 0.3-0.7 units/ml Monitor platelets by anticoagulation protocol: Yes   Plan:  10/6 HL @ 1042 = 0.18 which is subtherapeutic. However, lab was obtained late due to patient  being off the floor for a procedure in nuclear medicine for several hours. Per nuclear medicine, there were two interruptions in heparin infusion during the procedure (~0830 and 0930), which could attribute to subtherapeutic level.  Will continue heparin at current rate of 750 units/hr (12 units/kg/hr) and obtain a HL @ 1600 which is 6 hours after drip was resumed.  10/6 @ 1600: Per Dr. Vianne Bulls and nurse, heparin drip d/c.   Loree Fee, PharmD Clinical Pharmacist 07/02/2016,4:39 PM

## 2016-07-02 NOTE — Progress Notes (Signed)
Inpatient Diabetes Program Recommendations  AACE/ADA: New Consensus Statement on Inpatient Glycemic Control (2015)  Target Ranges:  Prepandial:   less than 140 mg/dL      Peak postprandial:   less than 180 mg/dL (1-2 hours)      Critically ill patients:  140 - 180 mg/dL   Lab Results  Component Value Date   GLUCAP 141 (H) 07/02/2016    Review of Glycemic Control  Results for Tammy, [name] (MRN [id number]) as of 07/02/2016 09:51  Ref. Range 07/01/2016 19:25 07/01/2016 20:15 07/02/2016 07:33  Glucose-Capillary Latest Ref Range: 65 - 99 mg/dL 56 (L) 130 (H) 141 (H)    Diabetes history: Type 2- A1C in care everywhere is 7.1% on 05/26/16 (she is a patient of Dr. Gabriel Carina)  Outpatient Diabetes medications: Lantus 9 units qhs, Novolog 12 units qam, 10 units q lunch and 12 units with supper  Current orders for Inpatient glycemic control: Lantus 12 units qhs, Novolog 10 units tid, Novolog 0-9 units tid, Novolog 0-5 units qhs  Inpatient Diabetes Program Recommendations:   Please consider decreasing Novolog mealtime insulin to 6 units tid (hold if patient eats less than 50%)- will likely eat less than what she eats at home.   Consider decreasing Lantus to 5 units qhs- admission CBG was 56mg /dl   Gentry Fitz, RN, [place], [place], CDE Diabetes Coordinator Inpatient Diabetes Program  [phone] (Team Pager) [phone] ([place]) 07/02/2016 10:04 AM

## 2016-07-02 NOTE — Progress Notes (Signed)
Spoke with lab - patient has been off of the floor this morning and they were unable to draw heparin level. Per RN, patient just returned to room. Lab to draw heparin level now.  Darylene Price [place] 10:25 AM

## 2016-07-02 NOTE — Progress Notes (Signed)
[facility]  SUBJECTIVE: no chest pain   Vitals:   07/01/16 2219 07/01/16 2259 07/02/16 0420 07/02/16 1118  BP: (!) 159/72 (!) 153/61 (!) 149/59 (!) 161/68  Pulse: 94 71 69 78  Resp: 18 15 16 17   Temp:  97.9 F (36.6 C) 98 F (36.7 C) 98.1 F (36.7 C)  TempSrc:  Oral    SpO2: 95% 95% 96% 95%  Weight:      Height:        Intake/Output Summary (Last 24 hours) at 07/02/16 1707 Last data filed at 07/02/16 0900  Gross per 24 hour  Intake           645.75 ml  Output              500 ml  Net           145.75 ml    LABS: Basic Metabolic Panel:  Recent Labs  07/01/16 1656 07/01/16 2306 07/02/16 1511  NA 133*  --   --   K 2.6*  --  3.6  CL 92*  --   --   CO2 31  --   --   GLUCOSE 138*  --   --   BUN 18  --   --   CREATININE 1.12*  --   --   CALCIUM 9.2  --   --   MG  --  1.6* 1.6*   Liver Function Tests: No results for input(s): AST, ALT, ALKPHOS, BILITOT, PROT, ALBUMIN in the last 72 hours. No results for input(s): LIPASE, AMYLASE in the last 72 hours. CBC:  Recent Labs  07/01/16 1656 07/02/16 1042  WBC 12.0* 9.2  HGB 13.1 12.5  HCT 39.5 36.0  MCV 82.0 81.5  PLT 306 267   Cardiac Enzymes:  Recent Labs  07/01/16 2306 07/02/16 0500 07/02/16 1042  TROPONINI <0.03 <0.03 <0.03   BNP: Invalid input(s): POCBNP D-Dimer: No results for input(s): DDIMER in the last 72 hours. Hemoglobin A1C: No results for input(s): HGBA1C in the last 72 hours. Fasting Lipid Panel: No results for input(s): CHOL, HDL, LDLCALC, TRIG, CHOLHDL, LDLDIRECT in the last 72 hours. Thyroid Function Tests: No results for input(s): TSH, T4TOTAL, T3FREE, THYROIDAB in the last 72 hours.  Invalid input(s): FREET3 Anemia Panel: No results for input(s): VITAMINB12, FOLATE, FERRITIN, TIBC, IRON, RETICCTPCT in the last 72 hours.   Physical Exam: Blood pressure (!) 161/68, pulse 78, temperature 98.1 F (36.7 C), resp. rate 17, height 5\' 2"  (1.575  m), weight 68 kg (150 lb), SpO2 95 %.   Wt Readings from Last 1 Encounters:  07/01/16 68 kg (150 lb)     General appearance: alert and cooperative Resp: clear to auscultation bilaterally Cardio: regular rate and rhythm Extremities: extremities normal, atraumatic, no cyanosis or edema Neurologic: Grossly normal  TELEMETRY: Reviewed telemetry pt in nsr  ASSESSMENT AND PLAN:  Active Problems:   Chest pain-myoview showed no ischemia. Pain likely due to tansient afib with rvr due to hypokalemia. K is 3.6. WOuld discharge oncurrent meds plus kdur 20 meq daily. F/U [place] in 1 week.     Teodoro Spray., MD, [facility] 07/02/2016 5:07 PM

## 2016-07-02 NOTE — Progress Notes (Addendum)
ELECTROLYTE CONSULT NOTE  Pharmacy Consulted to assist in electrolyte management in this 27 yoF admitted on 10/5 with a CC of chest pain.   1. Electrolytes:  10/5 @ 1656 K = 2.6; 10/5 @ 2306 mag = 1.6 Patient received KCl 10 mEq IV x 1 and KCl 40 mEq PO x 1. Pt is receiving KCl 20 mEq packet daily and Mag-Ox 400 mg PO daily beginning 10/6. 10/6 Will f/u with today's K and mag and replace as needed. AM mag and bnp have been ordered  Addendum: 10/06 1600 K = 3.6; Mag = 1.6 --> Will give patient magnesium 2 g IV in addition to daily mag tablet and f/u with am labs    Allergies  Allergen Reactions  . Neomycin-Bacitracin Zn-Polymyx Swelling  . Benzalkonium Chloride Itching and Swelling  . Ibuprofen     Other reaction(s): Dizziness Heart fluttering. Tachycardia. Tachycardia.  . Valdecoxib Nausea And Vomiting  . Albuterol Rash  . Latex Rash  . Tape Rash    blisters  . Triamcinolone Rash    Patient Measurements: Height: 5\' 2"  (157.5 cm) Weight: 150 lb (68 kg) IBW/kg (Calculated) : 50.1   Vital Signs: Temp: 98.1 F (36.7 C) (10/06 1118) BP: 161/68 (10/06 1118) Pulse Rate: 78 (10/06 1118) Intake/Output from previous day: 10/05 0701 - 10/06 0700 In: 645.8 [I.V.:45.8; IV Piggyback:600] Out: 300 [Urine:300] Intake/Output from this shift: Total I/O In: -  Out: 200 [Urine:200]  Labs:  Recent Labs  07/01/16 1656 07/02/16 1042  WBC 12.0* 9.2  HGB 13.1 12.5  HCT 39.5 36.0  PLT 306 267  APTT 32  --   INR 0.92  --      Recent Labs  07/01/16 1656 07/01/16 2306  NA 133*  --   K 2.6*  --   CL 92*  --   CO2 31  --   GLUCOSE 138*  --   BUN 18  --   CREATININE 1.12*  --   CALCIUM 9.2  --   MG  --  1.6*   Estimated Creatinine Clearance: 33.2 mL/min (by C-G formula based on SCr of 1.12 mg/dL (H)).    Recent Labs  07/01/16 2015 07/02/16 0733 07/02/16 1120  GLUCAP 130* 141* 190*    Medications:  Scheduled:  . aspirin  325 mg Oral Daily  . diltiazem   180 mg Oral Daily  . FLUoxetine  40 mg Oral Daily  . insulin aspart  0-5 Units Subcutaneous QHS  . insulin aspart  0-9 Units Subcutaneous TID WC  . insulin aspart  10 Units Subcutaneous TID WC  . insulin glargine  12 Units Subcutaneous QHS  . loratadine  10 mg Oral Daily  . losartan  100 mg Oral Daily  . magnesium oxide  400 mg Oral Daily  . montelukast  10 mg Oral QHS  . potassium chloride  20 mEq Oral Daily  . sodium chloride flush  3 mL Intravenous Q12H  . sodium chloride flush  3 mL Intravenous Q12H  . traZODone  50 mg Oral QHS  . vitamin C  1,000 mg Oral Daily  . vitamin E  1,000 Units Oral Daily      Darrow Bussing, PharmD Pharmacy Resident 07/02/2016 3:49 PM

## 2016-07-02 NOTE — Progress Notes (Signed)
A&O. UP with minimal assist. Admitted from home with chest pain. No CP now. Started on heparin drip. Call bell in reach. Bed alarm on. No s/s distress.

## 2016-07-02 NOTE — Consult Note (Signed)
[facility] CONSULT NOTE  Patient ID: [name] MRN: [id number] DOB/AGE: [birth year] 82 y.o.  Admit date: 07/01/2016 Referring Physician Dr. Edwina Barth Primary Physician   Primary Cardiologist Dr. Nehemiah Massed Reason for Consultation chest pain  HPI: Pt is a 82 yo female with history of  Paroxysmal afib, hyperlipidemia, hypertension and pvd who presented to the er with several days of episodic chest pain. Episodes did not appear to be exertional. She has preserved lv funciton with an ef of 55% by echo 1 years ago. She had a normal stress echo 2 years ago. She has ruled out for an mi. EKG revealed nsr with non specific st t waves changes. Pain has resolved. She states that it felt like her heart was racing when she had the chest pain. She is on diltizem 180 mg daily for her arhythmia. She had significant hypokalemia on admission of 2.9.   Review of Systems  Constitutional: Negative.   HENT: Negative.   Eyes: Negative.   Respiratory: Negative.   Cardiovascular: Positive for chest pain.  Gastrointestinal: Negative.   Genitourinary: Negative.   Musculoskeletal: Negative.   Skin: Negative.   Neurological: Negative.   Endo/Heme/Allergies: Negative.   Psychiatric/Behavioral: Negative.     Past Medical History:  Diagnosis Date  . Angina pectoris ([place])   . Atrial fibrillation ([place])   . Cervicalgia   . Chronic back pain   . COPD (chronic obstructive pulmonary disease) ([place])   . DDD (degenerative disc disease), lumbar   . Diabetes mellitus without complication ([place])   . Dysphagia   . Hyperlipemia   . Hypertension   . Vulvovaginitis     Family History  Problem Relation Age of Onset  . Ovarian cancer Other   . Diabetes Sister   . Colon cancer Brother   . Diabetes Brother   . Breast cancer Neg Hx     Social History   Social History  . Marital status: Widowed    Spouse name: N/A  . Number of children: N/A  . Years of  education: N/A   Occupational History  . Not on file.   Social History Main Topics  . Smoking status: Never Smoker  . Smokeless tobacco: Never Used  . Alcohol use No  . Drug use: No  . Sexual activity: No   Other Topics Concern  . Not on file   Social History Narrative  . No narrative on file    Past Surgical History:  Procedure Laterality Date  . gall bladder    . melanoma     removal neck and back  . PERIPHERAL VASCULAR CATHETERIZATION Left 01/05/2016   Procedure: Lower Extremity Angiography;  Surgeon: Algernon Huxley, MD;  Location: [facility];  Service: Cardiovascular;  Laterality: Left;  . PERIPHERAL VASCULAR CATHETERIZATION  01/05/2016   Procedure: Lower Extremity Intervention;  Surgeon: Algernon Huxley, MD;  Location: [facility];  Service: Cardiovascular;;  . ureterolithiasis     calculus removed  . VAGINAL HYSTERECTOMY    . VULVA / PERINEUM BIOPSY  05/29/2015     Prescriptions Prior to Admission  Medication Sig Dispense Refill Last Dose  . acetaminophen (TYLENOL) 500 MG tablet Take 1,000 mg by mouth every 6 (six) hours as needed.   prn at prn  . ALPRAZolam (XANAX) 0.25 MG tablet Take 0.25 mg by mouth at bedtime as needed for anxiety.   06/30/2016 at Unknown time  . Ascorbic Acid (VITAMIN C) 1000  MG tablet Take 1,000 mg by mouth daily.    06/30/2016 at Unknown time  . aspirin 325 MG tablet Take 325 mg by mouth daily. Reported on 01/05/2016   unknown at unknown  . benzonatate (TESSALON) 100 MG capsule Take by mouth 3 (three) times daily as needed for cough.   prn at prn  . benzonatate (TESSALON) 100 MG capsule TAKE 1 CAPSULE 3 TIMES DAILY AS NEEDED FOR COUGH 90 capsule 1 07/01/2016 at am  . cetirizine (ZYRTEC) 10 MG tablet TAKE ONE TABLET BY MOUTH EVERY DAY 90 tablet 1 07/01/2016 at am  . diltiazem (CARDIZEM CD) 180 MG 24 hr capsule TAKE 1 CAPSULE EVERY DAY 30 capsule 5 07/01/2016 at am  . FLUoxetine (PROZAC) 40 MG capsule 1 tablet daily 30 capsule 5 07/01/2016 at  am  . Insulin Glargine (LANTUS SOLOSTAR) 100 UNIT/ML Solostar Pen INJECT 9 UNITS SUBQ AT BEDTIME   06/30/2016 at Unknown time  . insulin lispro (HUMALOG KWIKPEN) 100 UNIT/ML KiwkPen Inject into the skin. 12 units in the am, 10 units at lunch and 12 units in the pm   07/01/2016 at Unknown time  . losartan-hydrochlorothiazide (HYZAAR) 100-25 MG tablet Take 1 tablet by mouth daily. 90 tablet 3 07/01/2016 at am  . Magnesium 400 MG CAPS Take by mouth daily.   06/30/2016 at Unknown time  . montelukast (SINGULAIR) 10 MG tablet TAKE ONE TABLET BY MOUTH AT BEDTIME 90 tablet 1 06/30/2016 at pm  . Multiple Vitamins-Minerals (ICAPS AREDS 2 PO) Take by mouth.   07/01/2016 at am  . naproxen sodium (ANAPROX) 220 MG tablet Take 220 mg by mouth 2 (two) times daily as needed.    prn at prn  . pantoprazole (PROTONIX) 40 MG tablet TAKE ONE TABLET (40 MG) BY MOUTH EVERY DAY 90 tablet 1 07/01/2016 at am  . Potassium 99 MG TABS Take by mouth.   07/01/2016 at am  . traZODone (DESYREL) 50 MG tablet Take 50 mg by mouth at bedtime.   06/30/2016 at pm  . vitamin E 400 UNIT capsule Take 1,000 Units by mouth daily.   07/01/2016 at am  . ASSURE COMFORT LANCETS 30G MISC    Taking  . Blood Glucose Calibration (ACCU-CHEK SMARTVIEW CONTROL) LIQD    Taking  . glucose blood test strip ACCU-CHEK AVIVA PLUS TEST STRP Use to check sugars 3 times daily.   Taking  . SURE COMFORT PEN NEEDLES 31G X 5 MM MISC    Taking    Physical Exam: Blood pressure (!) 149/59, pulse 69, temperature 98 F (36.7 C), resp. rate 16, height 5\' 2"  (1.575 m), weight 68 kg (150 lb), SpO2 96 %.   Wt Readings from Last 1 Encounters:  07/01/16 68 kg (150 lb)     General appearance: alert and cooperative Resp: clear to auscultation bilaterally Chest wall: no tenderness Cardio: regular rate and rhythm GI: soft, non-tender; bowel sounds normal; no masses,  no organomegaly Neurologic: Grossly normal  Labs:   Lab Results  Component Value Date   WBC 12.0 (H)  07/01/2016   HGB 13.1 07/01/2016   HCT 39.5 07/01/2016   MCV 82.0 07/01/2016   PLT 306 07/01/2016    Recent Labs Lab 07/01/16 1656  NA 133*  K 2.6*  CL 92*  CO2 31  BUN 18  CREATININE 1.12*  CALCIUM 9.2  GLUCOSE 138*   Lab Results  Component Value Date   CKTOTAL 54 02/06/2012   CKMB < 0.5 (L) 02/06/2012   TROPONINI <0.03  07/02/2016      Radiology. EKG: nsr with no ischemia  ASSESSMENT AND PLAN:  Pt with history of paroxysmal afib currently in nsr who has had episodeic chest pian curing episodes of a sensation of rapid heart rate . She was hypokalemic. She has normal lv funciton. She has ruled out for an mi and k has been repleated. Episodes of pain may be acs but may also represent symptoms with recurrent afib with her hypokalemia. Will continue current regimen and proceed with funcitonal study to further risk stratify.  Signed: Teodoro Spray MD, [facility] 07/02/2016, 8:15 AM

## 2016-07-02 NOTE — Progress Notes (Signed)
Patient admitted for chest pain. Troponins are negative for 3 times. Plan for Lexiscan stress test. If the stress test is negative patient can be discharged home. She is chest pain. 2. Discontinue heparin drip. Patient does have hypokalemia, hypomagnesemia. Replaced. Recheck labs are ordered. Possible discharge today. The stress test is negative, with a potassium is more than 3.5.

## 2016-07-02 NOTE — Discharge Summary (Signed)
[name], is a 82 y.o. female  DOB 1929/04/02  MRN [id number].  Admission date:  07/01/2016  Admitting Physician  Baxter Hire, MD  Discharge Date:  07/02/2016   Primary MD  Wilhemena Durie, MD  Recommendations for primary care physician for things to follow:   Follow-up with primary doctor in one week Follow up with primary cardiologist Dr. Nehemiah Massed in 1 week  Admission Diagnosis  Hypokalemia [E87.6] Chest pain, unspecified type [R07.9]   Discharge Diagnosis  Hypokalemia [E87.6] Chest pain, unspecified type [R07.9]  Active Problems:   Chest pain      Past Medical History:  Diagnosis Date  . Angina pectoris ([place])   . Atrial fibrillation ([place])   . Cervicalgia   . Chronic back pain   . COPD (chronic obstructive pulmonary disease) ([place])   . DDD (degenerative disc disease), lumbar   . Diabetes mellitus without complication ([place])   . Dysphagia   . Hyperlipemia   . Hypertension   . Vulvovaginitis     Past Surgical History:  Procedure Laterality Date  . gall bladder    . melanoma     removal neck and back  . PERIPHERAL VASCULAR CATHETERIZATION Left 01/05/2016   Procedure: Lower Extremity Angiography;  Surgeon: Algernon Huxley, MD;  Location: [facility];  Service: Cardiovascular;  Laterality: Left;  . PERIPHERAL VASCULAR CATHETERIZATION  01/05/2016   Procedure: Lower Extremity Intervention;  Surgeon: Algernon Huxley, MD;  Location: [facility];  Service: Cardiovascular;;  . ureterolithiasis     calculus removed  . VAGINAL HYSTERECTOMY    . VULVA / PERINEUM BIOPSY  05/29/2015       History of present illness and  Hospital Course:     Kindly see H&P for history of present illness and admission details, please review complete Labs, Consult reports and Test reports for all details in  brief  HPI  from the history and physical done on the day of admission Admitted because of chest pain. And has past medical history of diabetes mellitus type 2, advanced age, peripheral vascular disease.  Hospital Course  #1 chest pain likely secondary to muscular skeletal pain troponins are negative for 3 times. Patient did have a Lexiscan stress test. If her stress test is negative patient will be discharged home. #2 history of diabetes mellitus type 2: Seen by diabetes coordinator. Recommended to decrease the Lantus to 5 units daily at bedtime instead of 12 units daily at bedtime that she takes at home. Also decreased her NovoLog 6 units TID> #3 hypokalemia, hypomagnesemia secondary to diuretics: Replaced, recheck the potassium. If it is more than now 3.5 patient can be discharged home without supplement otherwise patient needs potassium supplements. HCTZ  Is on hold., #4 peripheral vascular disease: Patient had a history of stents  In  legs. Continue statins.  5 anxiety.;     Discharge Condition: stable   Follow UP      Discharge Instructions  and  Discharge Medications       Medication List    STOP taking these medications   LANTUS SOLOSTAR 100 UNIT/ML Solostar Pen Generic drug:  Insulin Glargine Replaced by:  insulin glargine 100 UNIT/ML injection     TAKE these medications   ACCU-CHEK SMARTVIEW CONTROL Liqd   acetaminophen 500 MG tablet Commonly known as:  TYLENOL Take 1,000 mg by mouth every 6 (six) hours as needed.   ALPRAZolam 0.25 MG tablet Commonly known as:  XANAX Take  0.25 mg by mouth at bedtime as needed for anxiety.   aspirin 325 MG tablet Take 325 mg by mouth daily. Reported on 01/05/2016   ASSURE COMFORT LANCETS 30G Misc   benzonatate 100 MG capsule Commonly known as:  TESSALON Take by mouth 3 (three) times daily as needed for cough. What changed:  Another medication with the same name was removed. Continue taking this medication, and follow the  directions you see here.   cetirizine 10 MG tablet Commonly known as:  ZYRTEC TAKE ONE TABLET BY MOUTH EVERY DAY   diltiazem 180 MG 24 hr capsule Commonly known as:  CARDIZEM CD TAKE 1 CAPSULE EVERY DAY   FLUoxetine 40 MG capsule Commonly known as:  PROZAC 1 tablet daily   glucose blood test strip ACCU-CHEK AVIVA PLUS TEST STRP Use to check sugars 3 times daily.   HUMALOG KWIKPEN 100 UNIT/ML KiwkPen Generic drug:  insulin lispro Inject into the skin. 12 units in the am, 10 units at lunch and 12 units in the pm   ICAPS AREDS 2 PO Take by mouth.   insulin glargine 100 UNIT/ML injection Commonly known as:  LANTUS Inject 0.05 mLs (5 Units total) into the skin at bedtime. Replaces:  LANTUS SOLOSTAR 100 UNIT/ML Solostar Pen   losartan-hydrochlorothiazide 100-25 MG tablet Commonly known as:  HYZAAR Take 1 tablet by mouth daily.   Magnesium 400 MG Caps Take by mouth daily.   montelukast 10 MG tablet Commonly known as:  SINGULAIR TAKE ONE TABLET BY MOUTH AT BEDTIME   naproxen sodium 220 MG tablet Commonly known as:  ANAPROX Take 220 mg by mouth 2 (two) times daily as needed.   pantoprazole 40 MG tablet Commonly known as:  PROTONIX TAKE ONE TABLET (40 MG) BY MOUTH EVERY DAY   Potassium 99 MG Tabs Take by mouth.   SURE COMFORT PEN NEEDLES 31G X 5 MM Misc Generic drug:  Insulin Pen Needle   traZODone 50 MG tablet Commonly known as:  DESYREL Take 50 mg by mouth at bedtime.   vitamin C 1000 MG tablet Take 1,000 mg by mouth daily.   vitamin E 400 UNIT capsule Take 1,000 Units by mouth daily.         Diet and Activity recommendation: See Discharge Instructions above   Consults obtained -cardio  Major procedures and Radiology Reports - PLEASE review detailed and final reports for all details, in brief -     Dg Chest 2 View  Result Date: 07/01/2016 CLINICAL DATA:  82 year old female with left-sided chest pain shortness of breath for the past 2-3 days  EXAM: CHEST  2 VIEW COMPARISON:  Prior chest x-ray 04/29/2016 FINDINGS: Cardiac and mediastinal contours are within normal limits. Atherosclerotic calcifications again noted in the transverse aorta. The lungs are clear. No evidence of pneumothorax. Surgical clips in a right upper quadrant suggest prior cholecystectomy. Osseous structures are intact and unremarkable. IMPRESSION: 1. Negative chest x-ray. 2.  Aortic Atherosclerosis (ICD10-170.0) Electronically Signed   By: Jacqulynn Cadet M.D.   On: 07/01/2016 17:23   Ct Angio Chest Pe W And/or Wo Contrast  Result Date: 07/01/2016 CLINICAL DATA:  Chest pain and shortness of breath for 3 days. Recent bronchitis. EXAM: CT ANGIOGRAPHY CHEST WITH CONTRAST TECHNIQUE: Multidetector CT imaging of the chest was performed using the standard protocol during bolus administration of intravenous contrast. Multiplanar CT image reconstructions and MIPs were obtained to evaluate the vascular anatomy. CONTRAST:  75 mL Isovue 370 IV COMPARISON:  Chest radiograph 07/01/2016 FINDINGS:  Cardiovascular: There is satisfactory opacification of the pulmonary arteries to the segmental level. There is no pulmonary embolus. The main pulmonary artery is within normal limits for size, measuring 2.4 cm at the bifurcation. There is no CT evidence of acute right heart strain. There is atherosclerotic calcification within the thoracic aorta. There is a bovine variant branching pattern, with a shared origin of the brachiocephalic and left common carotid arteries. Heart size is normal, without pericardial effusion. Mediastinum/Nodes: No mediastinal, hilar or axillary lymphadenopathy. The visualized thyroid and thoracic esophageal course are unremarkable. Lungs/Pleura: No pulmonary nodules or masses. No pleural effusion or pneumothorax. No focal airspace consolidation. No focal pleural abnormality. Upper Abdomen: Contrast bolus timing is not optimized for evaluation of the abdominal organs. Within  this limitation, the visualized organs of the upper abdomen are normal. Musculoskeletal: No bony spinal canal stenosis. No lytic or blastic lesions. T11 vertebral body hemangioma. Review of the MIP images confirms the above findings. IMPRESSION: No pulmonary embolus or other acute thoracic abnormality. Electronically Signed   By: Ulyses Jarred M.D.   On: 07/01/2016 20:13    Micro Results    No results found for this or any previous visit (from the past 240 hour(s)).     Today   Subjective:   [name] today has no headache,no chest abdominal pain,no new weakness tingling or numbness, feels much better wants to go home today.   Objective:   Blood pressure (!) 161/68, pulse 78, temperature 98.1 F (36.7 C), resp. rate 17, height 5\' 2"  (1.575 m), weight 68 kg (150 lb), SpO2 95 %.   Intake/Output Summary (Last 24 hours) at 07/02/16 1234 Last data filed at 07/02/16 0900  Gross per 24 hour  Intake           645.75 ml  Output              500 ml  Net           145.75 ml    Exam Awake Alert, Oriented x 3, No new F.N deficits, Normal affect [place].AT,PERRAL Supple Neck,No JVD, No cervical lymphadenopathy appriciated.  Symmetrical Chest wall movement, Good air movement bilaterally, CTAB RRR,No Gallops,Rubs or new Murmurs, No Parasternal Heave +ve B.Sounds, Abd Soft, Non tender, No organomegaly appriciated, No rebound -guarding or rigidity. No Cyanosis, Clubbing or edema, No new Rash or bruise  Data Review   CBC w Diff: Lab Results  Component Value Date   WBC 9.2 07/02/2016   HGB 12.5 07/02/2016   HGB 13.9 10/13/2014   HCT 36.0 07/02/2016   HCT 38.0 05/26/2016   PLT 267 07/02/2016   PLT 293 05/26/2016   LYMPHOPCT 33.1 02/08/2012   MONOPCT 12.9 02/08/2012   EOSPCT 3.7 02/08/2012   BASOPCT 0.7 02/08/2012    CMP: Lab Results  Component Value Date   NA 133 (L) 07/01/2016   NA 139 05/26/2016   NA 137 10/13/2014   K 2.6 (LL) 07/01/2016   K 3.6 10/13/2014   CL 92  (L) 07/01/2016   CL 98 10/13/2014   CO2 31 07/01/2016   CO2 31 10/13/2014   BUN 18 07/01/2016   BUN 16 05/26/2016   BUN 21 (H) 10/13/2014   CREATININE 1.12 (H) 07/01/2016   CREATININE 1.57 (H) 10/13/2014   PROT 7.2 05/26/2016   PROT 8.1 02/06/2012   ALBUMIN 4.1 05/26/2016   ALBUMIN 3.7 02/06/2012   BILITOT 0.3 05/26/2016   BILITOT 0.4 02/06/2012   ALKPHOS 105 05/26/2016   ALKPHOS 73 02/06/2012  AST 17 05/26/2016   AST 22 02/06/2012   ALT 19 05/26/2016   ALT 20 02/06/2012  .   Total Time in preparing paper work, data evaluation and todays exam - 64 minutes  Jakeisha Stricker M.D on 07/02/2016 at 12:34 PM    Note: This dictation was prepared with Dragon dictation along with smaller phrase technology. Any transcriptional errors that result from this process are unintentional.

## 2016-07-02 NOTE — Progress Notes (Signed)
ANTICOAGULATION CONSULT NOTE - Initial Consult  Pharmacy Consult for heparin drip Indication: ACS/STEMI  Allergies  Allergen Reactions  . Neomycin-Bacitracin Zn-Polymyx Swelling  . Benzalkonium Chloride Itching and Swelling  . Ibuprofen     Other reaction(s): Dizziness Heart fluttering. Tachycardia. Tachycardia.  . Valdecoxib Nausea And Vomiting  . Albuterol Rash  . Latex Rash  . Tape Rash    blisters  . Triamcinolone Rash   Patient Measurements: Height: 5\' 2"  (157.5 cm) Weight: 150 lb (68 kg) IBW/kg (Calculated) : 50.1 Heparin Dosing Weight: 64kg  Vital Signs: Temp: 98.1 F (36.7 C) (10/06 1118) BP: 161/68 (10/06 1118) Pulse Rate: 78 (10/06 1118)  Labs:  Recent Labs  07/01/16 1656  07/01/16 2306 07/02/16 0500 07/02/16 1042  HGB 13.1  --   --   --  12.5  HCT 39.5  --   --   --  36.0  PLT 306  --   --   --  267  APTT 32  --   --   --   --   LABPROT 12.4  --   --   --   --   INR 0.92  --   --   --   --   HEPARINUNFRC  --   --   --   --  0.18*  CREATININE 1.12*  --   --   --   --   TROPONINI <0.03  < > <0.03 <0.03 <0.03  < > = values in this interval not displayed.  Estimated Creatinine Clearance: 33.2 mL/min (by C-G formula based on SCr of 1.12 mg/dL (H)).   Medical History: Past Medical History:  Diagnosis Date  . Angina pectoris ([place])   . Atrial fibrillation ([place])   . Cervicalgia   . Chronic back pain   . COPD (chronic obstructive pulmonary disease) ([place])   . DDD (degenerative disc disease), lumbar   . Diabetes mellitus without complication ([place])   . Dysphagia   . Hyperlipemia   . Hypertension   . Vulvovaginitis     Medications:  No anticoagulation in PTA meds.  Assessment:  Goal of Therapy:  Heparin level 0.3-0.7 units/ml Monitor platelets by anticoagulation protocol: Yes   Plan:  10/6 HL @ 1042 = 0.18 which is subtherapeutic. However, lab was obtained late due to patient being off the floor for a procedure in nuclear medicine for  several hours. Per nuclear medicine, there were two interruptions in heparin infusion during the procedure (~0830 and 0930), which could attribute to subtherapeutic level.  Will continue heparin at current rate of 750 units/hr (12 units/kg/hr) and obtain a HL @ 1600 which is 6 hours after drip was resumed.  Lenis Noon, PharmD Clinical Pharmacist 07/02/2016,11:45 AM

## 2016-07-07 ENCOUNTER — Ambulatory Visit (INDEPENDENT_AMBULATORY_CARE_PROVIDER_SITE_OTHER): Payer: Medicare Other | Admitting: Family Medicine

## 2016-07-07 ENCOUNTER — Encounter: Payer: Self-pay | Admitting: Family Medicine

## 2016-07-07 VITALS — BP 118/60 | HR 83 | Temp 98.4°F | Resp 16 | Wt 152.0 lb

## 2016-07-07 DIAGNOSIS — R443 Hallucinations, unspecified: Secondary | ICD-10-CM | POA: Diagnosis not present

## 2016-07-07 DIAGNOSIS — I130 Hypertensive heart and chronic kidney disease with heart failure and stage 1 through stage 4 chronic kidney disease, or unspecified chronic kidney disease: Secondary | ICD-10-CM

## 2016-07-07 DIAGNOSIS — F411 Generalized anxiety disorder: Secondary | ICD-10-CM | POA: Diagnosis not present

## 2016-07-07 DIAGNOSIS — J449 Chronic obstructive pulmonary disease, unspecified: Secondary | ICD-10-CM

## 2016-07-07 DIAGNOSIS — I10 Essential (primary) hypertension: Secondary | ICD-10-CM

## 2016-07-07 DIAGNOSIS — E11649 Type 2 diabetes mellitus with hypoglycemia without coma: Secondary | ICD-10-CM | POA: Diagnosis not present

## 2016-07-07 DIAGNOSIS — E876 Hypokalemia: Secondary | ICD-10-CM

## 2016-07-07 MED ORDER — IPRATROPIUM-ALBUTEROL 20-100 MCG/ACT IN AERS: 1.0000 | INHALATION_SPRAY | Freq: Every day | RESPIRATORY_TRACT | 3 refills | Status: DC

## 2016-07-07 MED ORDER — ALPRAZOLAM 0.25 MG PO TABS: 0.1250 mg | ORAL_TABLET | Freq: Every day | ORAL | 5 refills | Status: DC

## 2016-07-07 MED ORDER — POTASSIUM CHLORIDE ER 10 MEQ PO TBCR: 10.0000 meq | EXTENDED_RELEASE_TABLET | Freq: Two times a day (BID) | ORAL | 3 refills | Status: DC

## 2016-07-07 NOTE — Progress Notes (Signed)
Patient: [name] Female    DOB: [birth year]   82 y.o.   MRN: [id number] Visit Date: 07/07/2016  Today's Provider: Wilhemena Durie, MD   Chief Complaint  Patient presents with  . Hospitalization Follow-up  . Hallucinations  . COPD  . Anxiety  . Hypertension   Subjective:    HPI     Follow up Hospitalization  Patient was admitted to [facility] on 07/01/2016 and discharged on 07/02/2016. She was treated for hypokalemia and chest pain. Treatment for this included holding HCTZ. She reports good compliance with treatment. She reports this condition is Improved. Chest pain was muscular; troponin was negative 3 times. Decreased Lantus and Novolog (DM /B Dr. Gabriel Carina). Daughter is reluctant to change medications until pt sees endo. Daughter reports pt needs "prescription potassium" per hospital, but did not give rx for this. ------------------------------------------------------------------------------------   Follow up for Hallucination  The patient was last seen for this 2 weeks ago. Changes made at last visit include discontinuing Prozac. MMSE was 20/30 at [place].  She reports good compliance with treatment. She feels that condition is Unchanged. She is having side effects. Not sleeping as well.  ------------------------------------------------------------------------------------    Follow up for COPD  The patient was last seen for this 2 weeks ago. Changes made at last visit include trying Respimat.  She reports excellent compliance with treatment. She feels that condition is Improved. She is having side effects, Coughing.  ------------------------------------------------------------------------------------  Follow up for Anxiety  The patient was last seen for this 2 weeks ago. Changes made at last visit include stopping Xanax.  She reports good compliance with treatment. She feels that condition is Unchanged. She is not having side  effects.  ------------------------------------------------------------------------------------   Follow up for HTN  The patient was last seen for this 2 weeks ago. Changes made at last visit include increasing Losartan.  She reports excellent compliance with treatment.   ------------------------------------------------------------------------------------        Allergies  Allergen Reactions  . Neomycin-Bacitracin Zn-Polymyx Swelling  . Benzalkonium Chloride Itching and Swelling  . Ibuprofen     Other reaction(s): Dizziness Heart fluttering. Tachycardia. Tachycardia.  . Valdecoxib Nausea And Vomiting  . Albuterol Rash  . Latex Rash  . Tape Rash    blisters  . Triamcinolone Rash     Current Outpatient Prescriptions:  .  acetaminophen (TYLENOL) 500 MG tablet, Take 1,000 mg by mouth every 6 (six) hours as needed., Disp: , Rfl:  .  Ascorbic Acid (VITAMIN C) 1000 MG tablet, Take 1,000 mg by mouth daily. , Disp: , Rfl:  .  ASSURE COMFORT LANCETS 30G MISC, , Disp: , Rfl:  .  benzonatate (TESSALON) 100 MG capsule, Take by mouth 2 (two) times daily. , Disp: , Rfl:  .  Blood Glucose Calibration (ACCU-CHEK SMARTVIEW CONTROL) LIQD, , Disp: , Rfl:  .  cetirizine (ZYRTEC) 10 MG tablet, TAKE ONE TABLET BY MOUTH EVERY DAY, Disp: 90 tablet, Rfl: 1 .  diltiazem (CARDIZEM CD) 180 MG 24 hr capsule, TAKE 1 CAPSULE EVERY DAY, Disp: 30 capsule, Rfl: 5 .  glucose blood test strip, ACCU-CHEK AVIVA PLUS TEST STRP Use to check sugars 3 times daily., Disp: , Rfl:  .  insulin glargine (LANTUS) 100 UNIT/ML injection, Inject 0.05 mLs (5 Units total) into the skin at bedtime., Disp: 10 mL, Rfl: 11 .  insulin lispro (HUMALOG KWIKPEN) 100 UNIT/ML KiwkPen, Inject into the skin. 12 units in the am, 10 units at lunch and  12 units in the pm, Disp: , Rfl:  .  losartan-hydrochlorothiazide (HYZAAR) 100-25 MG tablet, Take 1 tablet by mouth daily., Disp: 90 tablet, Rfl: 3 .  Magnesium 400 MG CAPS, Take by  mouth daily., Disp: , Rfl:  .  Multiple Vitamins-Minerals (ICAPS AREDS 2 PO), Take by mouth., Disp: , Rfl:  .  naproxen sodium (ANAPROX) 220 MG tablet, Take 220 mg by mouth 2 (two) times daily as needed. , Disp: , Rfl:  .  pantoprazole (PROTONIX) 40 MG tablet, TAKE ONE TABLET (40 MG) BY MOUTH EVERY DAY, Disp: 90 tablet, Rfl: 1 .  Potassium 99 MG TABS, Take by mouth., Disp: , Rfl:  .  SURE COMFORT PEN NEEDLES 31G X 5 MM MISC, , Disp: , Rfl:  .  traZODone (DESYREL) 50 MG tablet, Take 50 mg by mouth at bedtime., Disp: , Rfl:  .  vitamin E 400 UNIT capsule, Take 1,000 Units by mouth daily., Disp: , Rfl:  .  aspirin 325 MG tablet, Take 325 mg by mouth daily. Reported on 01/05/2016, Disp: , Rfl:  .  FLUoxetine (PROZAC) 20 MG capsule, , Disp: , Rfl:  .  montelukast (SINGULAIR) 10 MG tablet, TAKE ONE TABLET BY MOUTH AT BEDTIME, Disp: 90 tablet, Rfl: 1  Review of Systems  Constitutional: Positive for activity change, appetite change, chills and diaphoresis. Negative for fatigue, fever and unexpected weight change.  Eyes: Negative.   Respiratory: Positive for cough and shortness of breath. Negative for wheezing.   Cardiovascular: Positive for palpitations and leg swelling. Negative for chest pain.  Endocrine: Negative.   Allergic/Immunologic: Negative.   Psychiatric/Behavioral: Positive for hallucinations.    Social History  Substance Use Topics  . Smoking status: Never Smoker  . Smokeless tobacco: Never Used  . Alcohol use No   Objective:   BP 118/60 (BP Location: Right Arm, Patient Position: Sitting, Cuff Size: Large)   Pulse 83   Temp 98.4 F (36.9 C) (Oral)   Resp 16   Wt 152 lb (68.9 kg)   SpO2 95%   BMI 27.80 kg/m   Physical Exam  Constitutional: She is oriented to person, place, and time. She appears well-developed and well-nourished.  HENT:  Head: Normocephalic and atraumatic.  Right Ear: External ear normal.  Left Ear: External ear normal.  Nose: Nose normal.   Mouth/Throat: Oropharynx is clear and moist.  Eyes: Conjunctivae are normal. No scleral icterus.  Neck: No thyromegaly present.  Cardiovascular: Normal rate, regular rhythm and normal heart sounds.   Pulmonary/Chest: Effort normal and breath sounds normal. No respiratory distress.  Abdominal: Soft.  Lymphadenopathy:    She has no cervical adenopathy.  Neurological: She is alert and oriented to person, place, and time. No cranial nerve deficit. She exhibits normal muscle tone.  Skin: Skin is warm and dry.  Psychiatric: She has a normal mood and affect. Her behavior is normal.        Assessment & Plan:     1. Hypokalemia Will start K[place] Con as below.Follow up renal panel on her next visit. - potassium chloride (K-DUR) 10 MEQ tablet; Take 1 tablet (10 mEq total) by mouth 2 (two) times daily.  Dispense: 180 tablet; Refill: 3  2. Chronic obstructive pulmonary disease, unspecified COPD type ([place]) Spirometry is stable today. Can D/C Respimat. Cough seems to be improving. Workup was negative for any heart failure. Can refer to pulmonary but I do not think this is necessary and patient and daughter do not wish to do this  at this time. - Spirometry with Graph  3. Anxiety state Restart Alprazolam to improve sleep. Patient and daughter are reminded about why I would prefer to avoid benzodiazepines for this patient. All risk is discussed. Also this could make cognitive issues a little worse. - ALPRAZolam (XANAX) 0.25 MG tablet; Take 0.5-1 tablets (0.125-0.25 mg total) by mouth at bedtime.  Dispense: 30 tablet; Refill: 5 Consider cutting back further on her fluoxetine dose to visit. May simply go from 40-30 mg daily if possible. 4. Hallucinations Unchanged, and pt is not sleeping well with Prozac dose being decreased. Will restart Alprazolam at bedtime. I think this is part of her dementia.  5. Heart & renal disease, hypertensive, with heart failure ([place]) FU with cardiology as  scheduled.   6. Type 2 diabetes mellitus with hypoglycemia without coma, without long-term current use of insulin ([place]) FU with endocrinology as scheduled. Advised pt of the importance of decreasing insulin as prescribed by the hospital, due to hypoglycemic episodes.   7. Benign essential HTN Stable. 8. Mild cognitive impairment /early Alzheimer's disease Patient has such a charming personality she is able to hide her cognitive deficiencies. We'll follow this with her safety and mind going forward. She should definitely not drive. Since Aricept  soon. I'm trying to address her shoe and have cut back a little bit on the Prozac to where she is taking double dose of 40 mg daily.  9. Polypharmacy and an elderly lady  Try to simplify this is much as possible  Patient seen and examined by Miguel Aschoff, MD, and note scribed by Renaldo Fiddler, CMA.   Richard Cranford Mon, MD  [facility]

## 2016-07-08 ENCOUNTER — Ambulatory Visit: Payer: Medicare Other

## 2016-07-14 ENCOUNTER — Ambulatory Visit
Admission: RE | Admit: 2016-07-14 | Discharge: 2016-07-14 | Disposition: A | Discharge status: Home / Self Care | Payer: Medicare Other | Source: Ambulatory Visit | Attending: Neurology | Admitting: Neurology

## 2016-07-14 DIAGNOSIS — G309 Alzheimer's disease, unspecified: Secondary | ICD-10-CM | POA: Insufficient documentation

## 2016-07-14 DIAGNOSIS — G3183 Dementia with Lewy bodies: Secondary | ICD-10-CM | POA: Insufficient documentation

## 2016-07-14 DIAGNOSIS — R410 Disorientation, unspecified: Secondary | ICD-10-CM

## 2016-07-14 DIAGNOSIS — I6782 Cerebral ischemia: Secondary | ICD-10-CM | POA: Insufficient documentation

## 2016-07-14 DIAGNOSIS — F0281 Dementia in other diseases classified elsewhere with behavioral disturbance: Secondary | ICD-10-CM | POA: Insufficient documentation

## 2016-07-14 DIAGNOSIS — R412 Retrograde amnesia: Secondary | ICD-10-CM

## 2016-07-27 ENCOUNTER — Encounter (INDEPENDENT_AMBULATORY_CARE_PROVIDER_SITE_OTHER): Payer: Self-pay | Admitting: Vascular Surgery

## 2016-07-27 ENCOUNTER — Ambulatory Visit (INDEPENDENT_AMBULATORY_CARE_PROVIDER_SITE_OTHER): Payer: Medicare Other | Admitting: Vascular Surgery

## 2016-07-27 VITALS — BP 131/67 | HR 74 | Resp 16 | Ht 63.0 in | Wt 151.0 lb

## 2016-07-27 DIAGNOSIS — E7800 Pure hypercholesterolemia, unspecified: Secondary | ICD-10-CM | POA: Diagnosis not present

## 2016-07-27 DIAGNOSIS — I10 Essential (primary) hypertension: Secondary | ICD-10-CM

## 2016-07-27 DIAGNOSIS — M79604 Pain in right leg: Secondary | ICD-10-CM

## 2016-07-27 DIAGNOSIS — M79609 Pain in unspecified limb: Secondary | ICD-10-CM | POA: Insufficient documentation

## 2016-07-27 DIAGNOSIS — I739 Peripheral vascular disease, unspecified: Secondary | ICD-10-CM | POA: Diagnosis not present

## 2016-07-27 DIAGNOSIS — E11649 Type 2 diabetes mellitus with hypoglycemia without coma: Secondary | ICD-10-CM | POA: Diagnosis not present

## 2016-07-27 DIAGNOSIS — M79605 Pain in left leg: Secondary | ICD-10-CM

## 2016-07-27 NOTE — Assessment & Plan Note (Addendum)
lipid control important in reducing the progression of atherosclerotic disease.   

## 2016-07-27 NOTE — Progress Notes (Signed)
MRN : [id number]  [name] is a 82 y.o. (October 09, 1928) female who presents with chief complaint of  Chief Complaint  Patient presents with  . Follow-up  .  History of Present Illness: Patient returns today in follow up of Her PAD. She is complaining of pain on the outside of her legs mostly at rest. She has been having a lot of falls and her legs are quite bruised today. She is walking with a cane. She does not have ulceration or infection.  Current Outpatient Prescriptions  Medication Sig Dispense Refill  . acetaminophen (TYLENOL) 500 MG tablet Take 1,000 mg by mouth every 6 (six) hours as needed.    . ALPRAZolam (XANAX) 0.25 MG tablet Take 0.5-1 tablets (0.125-0.25 mg total) by mouth at bedtime. 30 tablet 5  . Ascorbic Acid (VITAMIN C) 1000 MG tablet Take 1,000 mg by mouth daily.     Marland Kitchen aspirin 325 MG tablet Take 325 mg by mouth daily. Reported on 01/05/2016    . ASSURE COMFORT LANCETS 30G MISC     . benzonatate (TESSALON) 100 MG capsule Take by mouth 2 (two) times daily.     . Blood Glucose Calibration (ACCU-CHEK SMARTVIEW CONTROL) LIQD     . cetirizine (ZYRTEC) 10 MG tablet TAKE ONE TABLET BY MOUTH EVERY DAY 90 tablet 1  . diltiazem (CARDIZEM CD) 180 MG 24 hr capsule TAKE 1 CAPSULE EVERY DAY 30 capsule 5  . FLUoxetine (PROZAC) 20 MG capsule     . glucose blood test strip ACCU-CHEK AVIVA PLUS TEST STRP Use to check sugars 3 times daily.    . insulin glargine (LANTUS) 100 UNIT/ML injection Inject 0.05 mLs (5 Units total) into the skin at bedtime. 10 mL 11  . insulin lispro (HUMALOG KWIKPEN) 100 UNIT/ML KiwkPen Inject into the skin. 12 units in the am, 10 units at lunch and 12 units in the pm    . losartan-hydrochlorothiazide (HYZAAR) 100-25 MG tablet Take 1 tablet by mouth daily. 90 tablet 3  . Magnesium 400 MG CAPS Take by mouth daily.    . montelukast (SINGULAIR) 10 MG tablet TAKE ONE TABLET BY MOUTH AT BEDTIME 90 tablet 1  . Multiple Vitamins-Minerals (ICAPS AREDS 2 PO)  Take by mouth.    . naproxen sodium (ANAPROX) 220 MG tablet Take 220 mg by mouth 2 (two) times daily as needed.     . pantoprazole (PROTONIX) 40 MG tablet TAKE ONE TABLET (40 MG) BY MOUTH EVERY DAY 90 tablet 1  . Potassium 99 MG TABS Take by mouth.    . potassium chloride (K-DUR) 10 MEQ tablet Take 1 tablet (10 mEq total) by mouth 2 (two) times daily. 180 tablet 3  . SURE COMFORT PEN NEEDLES 31G X 5 MM MISC     . traZODone (DESYREL) 50 MG tablet Take 50 mg by mouth at bedtime.    . vitamin E 400 UNIT capsule Take 1,000 Units by mouth daily.     No current facility-administered medications for this visit.     Past Medical History:  Diagnosis Date  . Angina pectoris ([place])   . Atrial fibrillation ([place])   . Cervicalgia   . Chronic back pain   . COPD (chronic obstructive pulmonary disease) ([place])   . DDD (degenerative disc disease), lumbar   . Diabetes mellitus without complication ([place])   . Dysphagia   . Hyperlipemia   . Hypertension   . Vulvovaginitis     Past Surgical History:  Procedure Laterality Date  .  gall bladder    . melanoma     removal neck and back  . PERIPHERAL VASCULAR CATHETERIZATION Left 01/05/2016   Procedure: Lower Extremity Angiography;  Surgeon: Algernon Huxley, MD;  Location: [facility];  Service: Cardiovascular;  Laterality: Left;  . PERIPHERAL VASCULAR CATHETERIZATION  01/05/2016   Procedure: Lower Extremity Intervention;  Surgeon: Algernon Huxley, MD;  Location: [facility];  Service: Cardiovascular;;  . ureterolithiasis     calculus removed  . VAGINAL HYSTERECTOMY    . VULVA / PERINEUM BIOPSY  05/29/2015    Social History Social History  Substance Use Topics  . Smoking status: Never Smoker  . Smokeless tobacco: Never Used  . Alcohol use No      Family History Family History  Problem Relation Age of Onset  . Ovarian cancer Other   . Diabetes Sister   . Colon cancer Brother   . Diabetes Brother   . Breast cancer Neg Hx       Allergies  Allergen Reactions  . Neomycin-Bacitracin Zn-Polymyx Swelling  . Benzalkonium Chloride Itching and Swelling  . Ibuprofen     Other reaction(s): Dizziness Heart fluttering. Tachycardia. Tachycardia.  . Valdecoxib Nausea And Vomiting  . Albuterol Rash  . Latex Rash  . Tape Rash    blisters  . Triamcinolone Rash     REVIEW OF SYSTEMS (Negative unless checked)  Constitutional: [] Weight loss  [] Fever  [] Chills Cardiac: [] Chest pain   [] Chest pressure   [] Palpitations   [] Shortness of breath when laying flat   [] Shortness of breath at rest   [] Shortness of breath with exertion. Vascular:  [x] Pain in legs with walking   [] Pain in legs at rest   [] Pain in legs when laying flat   [] Claudication   [] Pain in feet when walking  [] Pain in feet at rest  [] Pain in feet when laying flat   [] History of DVT   [] Phlebitis   [] Swelling in legs   [] Varicose veins   [] Non-healing ulcers Pulmonary:   [] Uses home oxygen   [] Productive cough   [] Hemoptysis   [] Wheeze  [] COPD   [] Asthma Neurologic:  [] Dizziness  [] Blackouts   [] Seizures   [] History of stroke   [] History of TIA  [] Aphasia   [] Temporary blindness   [] Dysphagia   [] Weakness or numbness in arms   [] Weakness or numbness in legs Musculoskeletal:  [x] Arthritis   [] Joint swelling   [x] Joint pain   [] Low back pain Hematologic:  [x] Easy bruising  [] Easy bleeding   [] Hypercoagulable state   [] Anemic   Gastrointestinal:  [] Blood in stool   [] Vomiting blood  [] Gastroesophageal reflux/heartburn   [] Abdominal pain Genitourinary:  [] Chronic kidney disease   [] Difficult urination  [] Frequent urination  [] Burning with urination   [] Hematuria Skin:  [] Rashes   [] Ulcers   [] Wounds Psychological:  [] History of anxiety   []  History of major depression.  Physical Examination  BP 131/67   Pulse 74   Resp 16   Ht 5' 3"  (1.6 m)   Wt 151 lb (68.5 kg)   BMI 26.75 kg/m  Gen:  WD/WN, NAD appears younger than stated age.  Head: [place]/AT, No  temporalis wasting. Ear/Nose/Throat: Hearing grossly intact, nares w/o erythema or drainage, trachea midline Eyes: Conjunctiva clear. Sclera non-icteric Neck: Supple.  No JVD.  Pulmonary:  Good air movement, no use of accessory muscles.  Cardiac: RRR, normal S1, S2 Vascular:  Vessel Right Left  Radial Palpable Palpable  Ulnar Palpable Palpable  Brachial Palpable  Palpable  Carotid Palpable, without bruit Palpable, without bruit  Aorta Not palpable N/A  Femoral Palpable Palpable  Popliteal Palpable Palpable  PT Palpable Palpable  DP Not Palpable Trace Palpable   Gastrointestinal: soft, non-tender/non-distended. No guarding/reflex.  Musculoskeletal: M/S 5/5 throughout.  No deformity or atrophy. 1+ bilateral lower extremity edema. Walk with a cane Neurologic: Sensation grossly intact in extremities.  Symmetrical.  Speech is fluent.  Psychiatric: Judgment intact, Mood & affect appropriate for pt's clinical situation. Dermatologic: No rashes or ulcers noted.  No cellulitis or open wounds. Lymph : No Cervical, Axillary, or Inguinal lymphadenopathy.      Labs Recent Results (from the past 2160 hour(s))  CBC w/Diff/Platelet     Status: Abnormal   Collection Time: 05/26/16  2:50 PM  Result Value Ref Range   WBC 10.8 3.4 - 10.8 x10E3/uL   RBC 4.58 3.77 - 5.28 x10E6/uL   Hemoglobin 12.2 11.1 - 15.9 g/dL   Hematocrit 38.0 34.0 - 46.6 %   MCV 83 79 - 97 fL   MCH 26.6 26.6 - 33.0 pg   MCHC 32.1 31.5 - 35.7 g/dL   RDW 15.3 12.3 - 15.4 %   Platelets 293 150 - 379 x10E3/uL   Neutrophils 54 %   Lymphs 32 %   Monocytes 10 %   Eos 4 %   Basos 0 %   Neutrophils Absolute 5.8 1.4 - 7.0 x10E3/uL   Lymphocytes Absolute 3.4 (H) 0.7 - 3.1 x10E3/uL   Monocytes Absolute 1.1 (H) 0.1 - 0.9 x10E3/uL   EOS (ABSOLUTE) 0.4 0.0 - 0.4 x10E3/uL   Basophils Absolute 0.0 0.0 - 0.2 x10E3/uL   Immature Granulocytes 0 %   Immature Grans (Abs) 0.0 0.0 - 0.1 x10E3/uL  Comprehensive metabolic panel      Status: Abnormal   Collection Time: 05/26/16  2:50 PM  Result Value Ref Range   Glucose 97 65 - 99 mg/dL    Comment: Specimen received in contact with cells. No visible hemolysis present. However GLUC may be decreased and K increased. Clinical correlation indicated.    BUN 16 8 - 27 mg/dL   Creatinine, Ser 1.06 (H) 0.57 - 1.00 mg/dL   GFR calc non Af Amer 48 (L) >59 mL/min/1.73   GFR calc Af Amer 55 (L) >59 mL/min/1.73   BUN/Creatinine Ratio 15 12 - 28   Sodium 139 134 - 144 mmol/L   Potassium 3.4 (L) 3.5 - 5.2 mmol/L   Chloride 92 (L) 96 - 106 mmol/L   CO2 29 18 - 29 mmol/L   Calcium 9.2 8.7 - 10.3 mg/dL   Total Protein 7.2 6.0 - 8.5 g/dL   Albumin 4.1 3.5 - 4.7 g/dL   Globulin, Total 3.1 1.5 - 4.5 g/dL   Albumin/Globulin Ratio 1.3 1.2 - 2.2   Bilirubin Total 0.3 0.0 - 1.2 mg/dL   Alkaline Phosphatase 105 39 - 117 IU/L   AST 17 0 - 40 IU/L   ALT 19 0 - 32 IU/L  TSH     Status: None   Collection Time: 05/26/16  2:50 PM  Result Value Ref Range   TSH 2.750 0.450 - 4.500 uIU/mL  Lipid Panel With LDL/HDL Ratio     Status: Abnormal   Collection Time: 05/26/16  2:50 PM  Result Value Ref Range   Cholesterol, Total 237 (H) 100 - 199 mg/dL   Triglycerides 313 (H) 0 - 149 mg/dL   HDL 54 >39 mg/dL   VLDL Cholesterol Cal 63 (H) 5 -  40 mg/dL   LDL Calculated 120 (H) 0 - 99 mg/dL   LDl/HDL Ratio 2.2 0.0 - 3.2 ratio units    Comment:                                     LDL/HDL Ratio                                             Men  Women                               1/2 Avg.Risk  1.0    1.5                                   Avg.Risk  3.6    3.2                                2X Avg.Risk  6.2    5.0                                3X Avg.Risk  8.0    6.1   Basic metabolic panel     Status: Abnormal   Collection Time: 07/01/16  4:56 PM  Result Value Ref Range   Sodium 133 (L) 135 - 145 mmol/L   Potassium 2.6 (LL) 3.5 - 5.1 mmol/L    Comment: CRITICAL RESULT CALLED TO, READ BACK BY  AND VERIFIED WITH Velta Addison RN AT 0347 07/01/16 MSS.    Chloride 92 (L) 101 - 111 mmol/L   CO2 31 22 - 32 mmol/L   Glucose, Bld 138 (H) 65 - 99 mg/dL   BUN 18 6 - 20 mg/dL   Creatinine, Ser 1.12 (H) 0.44 - 1.00 mg/dL   Calcium 9.2 8.9 - 10.3 mg/dL   GFR calc non Af Amer 44 (L) >60 mL/min   GFR calc Af Amer 50 (L) >60 mL/min    Comment: (NOTE) The eGFR has been calculated using the CKD EPI equation. This calculation has not been validated in all clinical situations. eGFR's persistently <60 mL/min signify possible Chronic Kidney Disease.    Anion gap 10 5 - 15  CBC     Status: Abnormal   Collection Time: 07/01/16  4:56 PM  Result Value Ref Range   WBC 12.0 (H) 3.6 - 11.0 K/uL   RBC 4.81 3.80 - 5.20 MIL/uL   Hemoglobin 13.1 12.0 - 16.0 g/dL   HCT 39.5 35.0 - 47.0 %   MCV 82.0 80.0 - 100.0 fL   MCH 27.3 26.0 - 34.0 pg   MCHC 33.3 32.0 - 36.0 g/dL   RDW 15.3 (H) 11.5 - 14.5 %   Platelets 306 150 - 440 K/uL  Troponin I     Status: None   Collection Time: 07/01/16  4:56 PM  Result Value Ref Range   Troponin I <0.03 <0.03 ng/mL  Protime-INR     Status: None   Collection Time: 07/01/16  4:56 PM  Result Value Ref Range   Prothrombin Time 12.4  11.4 - 15.2 seconds   INR 0.92   APTT     Status: None   Collection Time: 07/01/16  4:56 PM  Result Value Ref Range   aPTT 32 24 - 36 seconds  Glucose, capillary     Status: Abnormal   Collection Time: 07/01/16  7:25 PM  Result Value Ref Range   Glucose-Capillary 56 (L) 65 - 99 mg/dL  Glucose, capillary     Status: Abnormal   Collection Time: 07/01/16  8:15 PM  Result Value Ref Range   Glucose-Capillary 130 (H) 65 - 99 mg/dL  Troponin I     Status: None   Collection Time: 07/01/16  8:16 PM  Result Value Ref Range   Troponin I <0.03 <0.03 ng/mL  Magnesium     Status: Abnormal   Collection Time: 07/01/16 11:06 PM  Result Value Ref Range   Magnesium 1.6 (L) 1.7 - 2.4 mg/dL  Troponin I     Status: None   Collection Time:  07/01/16 11:06 PM  Result Value Ref Range   Troponin I <0.03 <0.03 ng/mL  Troponin I     Status: None   Collection Time: 07/02/16  5:00 AM  Result Value Ref Range   Troponin I <0.03 <0.03 ng/mL  Glucose, capillary     Status: Abnormal   Collection Time: 07/02/16  7:33 AM  Result Value Ref Range   Glucose-Capillary 141 (H) 65 - 99 mg/dL  NM Myocar Multi W/Spect W/Wall Motion / EF     Status: None   Collection Time: 07/02/16 10:10 AM  Result Value Ref Range   Rest HR 75 bpm   Rest BP 149/62 mmHg   Exercise duration (sec) 1 sec   Percent HR 77 %   Exercise duration (min) 1 min   Estimated workload 1.0 METS   Peak HR 105 bpm   Peak BP 187/83 mmHg   MPHR 135 bpm   SSS 12    SRS 2    SDS 12    TID 0.64    LV sys vol 23 mL   LV dias vol 52 46 - 106 mL  Troponin I     Status: None   Collection Time: 07/02/16 10:42 AM  Result Value Ref Range   Troponin I <0.03 <0.03 ng/mL  Heparin level (unfractionated)     Status: Abnormal   Collection Time: 07/02/16 10:42 AM  Result Value Ref Range   Heparin Unfractionated 0.18 (L) 0.30 - 0.70 IU/mL    Comment:        IF HEPARIN RESULTS ARE BELOW EXPECTED VALUES, AND PATIENT DOSAGE HAS BEEN CONFIRMED, SUGGEST FOLLOW UP TESTING OF ANTITHROMBIN III LEVELS.   CBC     Status: Abnormal   Collection Time: 07/02/16 10:42 AM  Result Value Ref Range   WBC 9.2 3.6 - 11.0 K/uL   RBC 4.42 3.80 - 5.20 MIL/uL   Hemoglobin 12.5 12.0 - 16.0 g/dL   HCT 36.0 35.0 - 47.0 %   MCV 81.5 80.0 - 100.0 fL   MCH 28.4 26.0 - 34.0 pg   MCHC 34.8 32.0 - 36.0 g/dL   RDW 15.4 (H) 11.5 - 14.5 %   Platelets 267 150 - 440 K/uL  Glucose, capillary     Status: Abnormal   Collection Time: 07/02/16 11:20 AM  Result Value Ref Range   Glucose-Capillary 190 (H) 65 - 99 mg/dL  Heparin level (unfractionated)     Status: Abnormal   Collection Time: 07/02/16  3:11 PM  Result Value Ref Range   Heparin Unfractionated <0.10 (L) 0.30 - 0.70 IU/mL    Comment:        IF  HEPARIN RESULTS ARE BELOW EXPECTED VALUES, AND PATIENT DOSAGE HAS BEEN CONFIRMED, SUGGEST FOLLOW UP TESTING OF ANTITHROMBIN III LEVELS.   Magnesium     Status: Abnormal   Collection Time: 07/02/16  3:11 PM  Result Value Ref Range   Magnesium 1.6 (L) 1.7 - 2.4 mg/dL  Potassium     Status: None   Collection Time: 07/02/16  3:11 PM  Result Value Ref Range   Potassium 3.6 3.5 - 5.1 mmol/L  Glucose, capillary     Status: Abnormal   Collection Time: 07/02/16  4:54 PM  Result Value Ref Range   Glucose-Capillary 142 (H) 65 - 99 mg/dL  Glucose, capillary     Status: Abnormal   Collection Time: 07/02/16  6:10 PM  Result Value Ref Range   Glucose-Capillary 156 (H) 65 - 99 mg/dL    Radiology Dg Chest 2 View  Result Date: 07/01/2016 CLINICAL DATA:  82 year old female with left-sided chest pain shortness of breath for the past 2-3 days EXAM: CHEST  2 VIEW COMPARISON:  Prior chest x-ray 04/29/2016 FINDINGS: Cardiac and mediastinal contours are within normal limits. Atherosclerotic calcifications again noted in the transverse aorta. The lungs are clear. No evidence of pneumothorax. Surgical clips in a right upper quadrant suggest prior cholecystectomy. Osseous structures are intact and unremarkable. IMPRESSION: 1. Negative chest x-ray. 2.  Aortic Atherosclerosis (ICD10-170.0) Electronically Signed   By: Jacqulynn Cadet M.D.   On: 07/01/2016 17:23   Ct Angio Chest Pe W And/or Wo Contrast  Result Date: 07/01/2016 CLINICAL DATA:  Chest pain and shortness of breath for 3 days. Recent bronchitis. EXAM: CT ANGIOGRAPHY CHEST WITH CONTRAST TECHNIQUE: Multidetector CT imaging of the chest was performed using the standard protocol during bolus administration of intravenous contrast. Multiplanar CT image reconstructions and MIPs were obtained to evaluate the vascular anatomy. CONTRAST:  75 mL Isovue 370 IV COMPARISON:  Chest radiograph 07/01/2016 FINDINGS: Cardiovascular: There is satisfactory opacification of  the pulmonary arteries to the segmental level. There is no pulmonary embolus. The main pulmonary artery is within normal limits for size, measuring 2.4 cm at the bifurcation. There is no CT evidence of acute right heart strain. There is atherosclerotic calcification within the thoracic aorta. There is a bovine variant branching pattern, with a shared origin of the brachiocephalic and left common carotid arteries. Heart size is normal, without pericardial effusion. Mediastinum/Nodes: No mediastinal, hilar or axillary lymphadenopathy. The visualized thyroid and thoracic esophageal course are unremarkable. Lungs/Pleura: No pulmonary nodules or masses. No pleural effusion or pneumothorax. No focal airspace consolidation. No focal pleural abnormality. Upper Abdomen: Contrast bolus timing is not optimized for evaluation of the abdominal organs. Within this limitation, the visualized organs of the upper abdomen are normal. Musculoskeletal: No bony spinal canal stenosis. No lytic or blastic lesions. T11 vertebral body hemangioma. Review of the MIP images confirms the above findings. IMPRESSION: No pulmonary embolus or other acute thoracic abnormality. Electronically Signed   By: Ulyses Jarred M.D.   On: 07/01/2016 20:13   Mr Brain Wo Contrast  Result Date: 07/14/2016 CLINICAL DATA:  Loss of memory and confusion. Visual hallucinations. Occasional blurred vision. EXAM: MRI HEAD WITHOUT CONTRAST TECHNIQUE: Multiplanar, multiecho pulse sequences of the brain and surrounding structures were obtained without intravenous contrast. COMPARISON:  Head CTA 08/31/2015 and MRI 07/16/2009 FINDINGS: Brain: There is no evidence of acute  infarct, intracranial hemorrhage, mass, midline shift, or extra-axial fluid collection. Mild generalized cerebral atrophy is within normal limits for age. 5 mm T2 hyperintense focus in the left cerebral peduncle is unchanged and may represent a cyst or dilated perivascular space rather than chronic  infarct given lack of surrounding gliosis. Patchy T2 hyperintensities in the subcortical and deep cerebral white matter bilaterally have progressed from 2010 and are nonspecific but compatible with mild chronic small vessel ischemic disease. Chronic lacunar white matter infarcts in the centrum semiovale bilaterally and in the right thalamus are new from 2010. Minimal chronic small vessel ischemic changes are present in the pons. Vascular: Major intracranial vascular flow voids are preserved. Skull and upper cervical spine: No suspicious osseous lesion identified. Grade 1 anterolisthesis again noted of C3 on C4. Sinuses/Orbits: Prior bilateral cataract extraction. Paranasal sinuses are clear. There is a small left mastoid effusion. 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 IMPRESSION: 1. No acute intracranial abnormality. 2. Chronic small vessel ischemia, progressed from 2010. Electronically Signed   By: Logan Bores M.D.   On: 07/14/2016 11:21   Nm Myocar Multi W/spect W/wall Motion / Ef  Result Date: 07/02/2016  There was no ST segment deviation noted during stress.  No evidence of ischemia  No ischemia Normal lv function      Assessment/Plan  Type 2 diabetes mellitus (HCC) blood glucose control important in reducing the progression of atherosclerotic disease. Also, involved in wound healing. On appropriate medications.   Hypercholesteremia lipid control important in reducing the progression of atherosclerotic disease.    Pain in limb Her current pain sounds more neuropathic then arterial. Nonetheless, she is well passed due for her arterial assessments and this should be done at her convenience.  Benign essential HTN Although her pain does not currently sound like ischemic rest pain or arterial in nature, she is well passed due for her noninvasive studies. ABIs to be done at her convenience.    Leotis Pain, MD  07/27/2016 4:15 PM    This note was created with Dragon medical  transcription system.  Any errors from dictation are purely unintentional

## 2016-07-27 NOTE — Assessment & Plan Note (Signed)
blood glucose control important in reducing the progression of atherosclerotic disease. Also, involved in wound healing. On appropriate medications.  

## 2016-07-27 NOTE — Assessment & Plan Note (Signed)
Her current pain sounds more neuropathic then arterial. Nonetheless, she is well passed due for her arterial assessments and this should be done at her convenience.

## 2016-07-27 NOTE — Assessment & Plan Note (Signed)
Although her pain does not currently sound like ischemic rest pain or arterial in nature, she is well passed due for her noninvasive studies. ABIs to be done at her convenience.

## 2016-08-24 ENCOUNTER — Other Ambulatory Visit: Payer: Self-pay | Admitting: Family Medicine

## 2016-08-24 DIAGNOSIS — J3089 Other allergic rhinitis: Secondary | ICD-10-CM

## 2016-08-24 DIAGNOSIS — J449 Chronic obstructive pulmonary disease, unspecified: Secondary | ICD-10-CM

## 2016-08-24 DIAGNOSIS — K219 Gastro-esophageal reflux disease without esophagitis: Secondary | ICD-10-CM

## 2016-08-31 ENCOUNTER — Ambulatory Visit (INDEPENDENT_AMBULATORY_CARE_PROVIDER_SITE_OTHER): Payer: Medicare Other | Admitting: Family Medicine

## 2016-08-31 ENCOUNTER — Encounter: Payer: Self-pay | Admitting: Family Medicine

## 2016-08-31 VITALS — BP 118/62 | HR 82 | Temp 98.7°F | Resp 16 | Wt 153.0 lb

## 2016-08-31 DIAGNOSIS — J029 Acute pharyngitis, unspecified: Secondary | ICD-10-CM

## 2016-08-31 DIAGNOSIS — J40 Bronchitis, not specified as acute or chronic: Secondary | ICD-10-CM

## 2016-08-31 MED ORDER — AZITHROMYCIN 250 MG PO TABS: ORAL_TABLET | ORAL | 0 refills | Status: DC

## 2016-08-31 NOTE — Progress Notes (Signed)
Patient: [name] Female    DOB: [birth year]   82 y.o.   MRN: [id number] Visit Date: 08/31/2016  Today's Provider: Wilhemena Durie, MD   Chief Complaint  Patient presents with  . URI   Subjective:    URI   This is a new problem. The current episode started yesterday. Maximum temperature: Fever has not been documented, but pt had chills last night, and pt felt "warm" Associated symptoms include congestion, coughing, headaches, neck pain, rhinorrhea (and PND), a sore throat and swollen glands. Pertinent negatives include no abdominal pain, chest pain, diarrhea, dysuria, ear pain, nausea, plugged ear sensation, sinus pain, sneezing, vomiting or wheezing. She has tried acetaminophen for the symptoms. The treatment provided mild relief.       Allergies  Allergen Reactions  . Neomycin-Bacitracin Zn-Polymyx Swelling  . Benzalkonium Chloride Itching and Swelling  . Ibuprofen     Other reaction(s): Dizziness Heart fluttering. Tachycardia. Tachycardia.  . Valdecoxib Nausea And Vomiting  . Albuterol Rash  . Latex Rash  . Tape Rash    blisters  . Triamcinolone Rash     Current Outpatient Prescriptions:  .  acetaminophen (TYLENOL) 500 MG tablet, Take 1,000 mg by mouth every 6 (six) hours as needed., Disp: , Rfl:  .  ALPRAZolam (XANAX) 0.25 MG tablet, Take 0.5-1 tablets (0.125-0.25 mg total) by mouth at bedtime., Disp: 30 tablet, Rfl: 5 .  Ascorbic Acid (VITAMIN C) 1000 MG tablet, Take 1,000 mg by mouth daily. , Disp: , Rfl:  .  aspirin 325 MG tablet, Take 325 mg by mouth daily. Reported on 01/05/2016, Disp: , Rfl:  .  ASSURE COMFORT LANCETS 30G MISC, , Disp: , Rfl:  .  benzonatate (TESSALON) 100 MG capsule, Take by mouth 2 (two) times daily. , Disp: , Rfl:  .  benzonatate (TESSALON) 100 MG capsule, TAKE 1 CAPSULE 3 TIMES DAILY AS NEEDED FOR COUGH, Disp: 90 capsule, Rfl: 5 .  Blood Glucose Calibration (ACCU-CHEK SMARTVIEW CONTROL) LIQD, , Disp: , Rfl:  .   cetirizine (ZYRTEC) 10 MG tablet, TAKE ONE TABLET BY MOUTH EVERY DAY, Disp: 90 tablet, Rfl: 1 .  diltiazem (CARDIZEM CD) 180 MG 24 hr capsule, TAKE 1 CAPSULE EVERY DAY, Disp: 30 capsule, Rfl: 5 .  FLUoxetine (PROZAC) 20 MG capsule, , Disp: , Rfl:  .  glucose blood test strip, ACCU-CHEK AVIVA PLUS TEST STRP Use to check sugars 3 times daily., Disp: , Rfl:  .  insulin glargine (LANTUS) 100 UNIT/ML injection, Inject 0.05 mLs (5 Units total) into the skin at bedtime., Disp: 10 mL, Rfl: 11 .  insulin lispro (HUMALOG KWIKPEN) 100 UNIT/ML KiwkPen, Inject into the skin. 12 units in the am, 10 units at lunch and 12 units in the pm, Disp: , Rfl:  .  losartan-hydrochlorothiazide (HYZAAR) 100-25 MG tablet, Take 1 tablet by mouth daily., Disp: 90 tablet, Rfl: 3 .  Magnesium 400 MG CAPS, Take by mouth daily., Disp: , Rfl:  .  montelukast (SINGULAIR) 10 MG tablet, TAKE ONE TABLET BY MOUTH AT BEDTIME, Disp: 90 tablet, Rfl: 3 .  Multiple Vitamins-Minerals (ICAPS AREDS 2 PO), Take by mouth., Disp: , Rfl:  .  naproxen sodium (ANAPROX) 220 MG tablet, Take 220 mg by mouth 2 (two) times daily as needed. , Disp: , Rfl:  .  pantoprazole (PROTONIX) 40 MG tablet, TAKE ONE TABLET BY MOUTH EVERY DAY, Disp: 90 tablet, Rfl: 3 .  Potassium 99 MG TABS, Take by mouth., Disp: ,  Rfl:  .  potassium chloride (K-DUR) 10 MEQ tablet, Take 1 tablet (10 mEq total) by mouth 2 (two) times daily., Disp: 180 tablet, Rfl: 3 .  SURE COMFORT PEN NEEDLES 31G X 5 MM MISC, , Disp: , Rfl:  .  traZODone (DESYREL) 50 MG tablet, Take 50 mg by mouth at bedtime., Disp: , Rfl:  .  vitamin E 400 UNIT capsule, Take 1,000 Units by mouth daily., Disp: , Rfl:   Review of Systems  Constitutional: Negative.   HENT: Positive for congestion, rhinorrhea (and PND) and sore throat. Negative for ear pain, sinus pain and sneezing.   Eyes: Positive for redness. Negative for pain, discharge and itching.  Respiratory: Positive for cough. Negative for wheezing.     Cardiovascular: Negative for chest pain.  Gastrointestinal: Negative for abdominal pain, diarrhea, nausea and vomiting.  Endocrine: Negative.   Genitourinary: Negative for dysuria.  Musculoskeletal: Positive for arthralgias, back pain and neck pain.  Allergic/Immunologic: Negative.   Neurological: Positive for headaches.    Social History  Substance Use Topics  . Smoking status: Never Smoker  . Smokeless tobacco: Never Used  . Alcohol use No   Objective:   BP 118/62 (BP Location: Right Arm, Patient Position: Sitting, Cuff Size: Normal)   Pulse 82   Temp 98.7 F (37.1 C) (Oral)   Resp 16   Wt 153 lb (69.4 kg)   SpO2 95%   BMI 27.10 kg/m   Physical Exam  Constitutional: She is oriented to person, place, and time. She appears well-developed and well-nourished.  HENT:  Head: Normocephalic and atraumatic.  Right Ear: External ear normal.  Left Ear: External ear normal.  Nose: Nose normal.  Eyes: Conjunctivae are normal. No scleral icterus.  Mild left lateral conjunctival hemorrhage.  Neck: No thyromegaly present.  Cardiovascular: Normal rate, regular rhythm and normal heart sounds.   Pulmonary/Chest: Effort normal and breath sounds normal.  Abdominal: Soft.  Neurological: She is alert and oriented to person, place, and time.  Skin: Skin is warm and dry.  Patient has very fair skin.  Psychiatric: She has a normal mood and affect. Her behavior is normal. Judgment and thought content normal.        Assessment & Plan:     1. Pharyngitis, unspecified etiology Normal exam. Daughter is worried about strep and this is cover with Z-Pak. - azithromycin (ZITHROMAX) 250 MG tablet; Take 2 tablets now, then one daily until finished.  Dispense: 6 tablet; Refill: 0  2. Bronchitis  azithromycin (ZITHROMAX) 250 MG tablet; Take 2 tablets now, then one daily until finished.  Dispense: 6 tablet; Refill:  No x-ray needed. Treat clinically. 3. Osteoarthritis with chronic pain Try to  merit daily. NSAID helps but safety consideration is an issue. 4. Type 2 diabetes 5. Left conjunctival hemorrhage Probably related to coughing due to URI. Treat with warm compresses. 6. Mild cognitive impairment 7. Mild depression 8. Polypharmacy Would like to slowly decrease any unnecessary meds in this 82 year old lady.  I have done the exam and reviewed the chart and it is accurate to the best of my knowledge. Development worker, community has been used and  any errors in dictation or transcription are unintentional. Miguel Aschoff M.D. [facility]     Patient seen and examined by Miguel Aschoff, MD, and note scribed by Renaldo Fiddler, CMA.  Zilpha Mcandrew Cranford Mon, MD  [facility]

## 2016-08-31 NOTE — Patient Instructions (Signed)
Try over the counter Tumeric daily.  

## 2016-09-03 ENCOUNTER — Ambulatory Visit (INDEPENDENT_AMBULATORY_CARE_PROVIDER_SITE_OTHER): Payer: Medicare Other | Admitting: Vascular Surgery

## 2016-09-03 ENCOUNTER — Ambulatory Visit: Payer: Medicare Other | Admitting: Podiatry

## 2016-09-03 ENCOUNTER — Encounter (INDEPENDENT_AMBULATORY_CARE_PROVIDER_SITE_OTHER): Payer: Medicare Other

## 2016-09-22 ENCOUNTER — Other Ambulatory Visit: Payer: Self-pay | Admitting: Family Medicine

## 2016-09-22 DIAGNOSIS — I10 Essential (primary) hypertension: Secondary | ICD-10-CM

## 2016-09-24 DIAGNOSIS — E1122 Type 2 diabetes mellitus with diabetic chronic kidney disease: Secondary | ICD-10-CM | POA: Diagnosis not present

## 2016-09-24 DIAGNOSIS — Z794 Long term (current) use of insulin: Secondary | ICD-10-CM | POA: Diagnosis not present

## 2016-09-24 DIAGNOSIS — N183 Chronic kidney disease, stage 3 (moderate): Secondary | ICD-10-CM | POA: Diagnosis not present

## 2016-09-28 ENCOUNTER — Ambulatory Visit (INDEPENDENT_AMBULATORY_CARE_PROVIDER_SITE_OTHER): Payer: Medicare Other

## 2016-09-28 ENCOUNTER — Ambulatory Visit (INDEPENDENT_AMBULATORY_CARE_PROVIDER_SITE_OTHER): Payer: Medicare Other | Admitting: Vascular Surgery

## 2016-09-28 ENCOUNTER — Encounter (INDEPENDENT_AMBULATORY_CARE_PROVIDER_SITE_OTHER): Payer: Self-pay | Admitting: Vascular Surgery

## 2016-09-28 VITALS — BP 146/71 | HR 69 | Resp 16 | Ht 63.5 in | Wt 149.0 lb

## 2016-09-28 DIAGNOSIS — I6523 Occlusion and stenosis of bilateral carotid arteries: Secondary | ICD-10-CM | POA: Diagnosis not present

## 2016-09-28 DIAGNOSIS — I739 Peripheral vascular disease, unspecified: Secondary | ICD-10-CM

## 2016-09-28 DIAGNOSIS — E11649 Type 2 diabetes mellitus with hypoglycemia without coma: Secondary | ICD-10-CM | POA: Diagnosis not present

## 2016-09-28 DIAGNOSIS — I10 Essential (primary) hypertension: Secondary | ICD-10-CM | POA: Diagnosis not present

## 2016-09-28 NOTE — Assessment & Plan Note (Signed)
blood glucose control important in reducing the progression of atherosclerotic disease. Also, involved in wound healing. On appropriate medications.  

## 2016-09-28 NOTE — Assessment & Plan Note (Signed)
blood pressure control important in reducing the progression of atherosclerotic disease. On appropriate oral medications.  

## 2016-09-28 NOTE — Assessment & Plan Note (Signed)
Her ABIs today are normal with digit pressures in the 90s bilaterally. Good waveforms bilaterally. Currently she does not have any severe ischemia and has done well after an intervention about 9 months ago. We will plan to recheck this in 6 months or sooner if problems develop in the interim.

## 2016-09-28 NOTE — Progress Notes (Signed)
MRN : [id number]  [name] is a 83 y.o. ([birth year]) female who presents with chief complaint of  Chief Complaint  Patient presents with  . Re-evaluation    Ultrasound follow up  .  History of Present Illness: Patient returns today in follow up of Her peripheral arterial disease. She is doing well. She still has some claudication symptoms in both legs, but at this point she does not have any ulceration, infection, or rest pain. She is undergone multiple previous revascularizations in the past.  Her ABIs today are normal with good wave forms and basically normal digital pressures bilaterally.  Current Outpatient Prescriptions  Medication Sig Dispense Refill  . acetaminophen (TYLENOL) 500 MG tablet Take 1,000 mg by mouth every 6 (six) hours as needed.    . ALPRAZolam (XANAX) 0.25 MG tablet Take 0.5-1 tablets (0.125-0.25 mg total) by mouth at bedtime. 30 tablet 5  . Ascorbic Acid (VITAMIN C) 1000 MG tablet Take 1,000 mg by mouth daily.     Marland Kitchen aspirin 325 MG tablet Take 325 mg by mouth daily. Reported on 01/05/2016    . ASSURE COMFORT LANCETS 30G MISC     . benzonatate (TESSALON) 100 MG capsule Take by mouth 2 (two) times daily.     . Blood Glucose Calibration (ACCU-CHEK SMARTVIEW CONTROL) LIQD     . cetirizine (ZYRTEC) 10 MG tablet TAKE ONE TABLET BY MOUTH EVERY DAY 90 tablet 1  . diltiazem (CARDIZEM CD) 180 MG 24 hr capsule TAKE 1 CAPSULE EVERY DAY 30 capsule 5  . FLUoxetine (PROZAC) 20 MG capsule     . glucose blood test strip ACCU-CHEK AVIVA PLUS TEST STRP Use to check sugars 3 times daily.    . insulin glargine (LANTUS) 100 UNIT/ML injection Inject 0.05 mLs (5 Units total) into the skin at bedtime. 10 mL 11  . insulin lispro (HUMALOG KWIKPEN) 100 UNIT/ML KiwkPen Inject into the skin. 12 units in the am, 10 units at lunch and 12 units in the pm    . losartan-hydrochlorothiazide (HYZAAR) 100-25 MG tablet Take 1 tablet by mouth daily. 90 tablet 3  .  Magnesium 400 MG CAPS Take by mouth daily.    . montelukast (SINGULAIR) 10 MG tablet TAKE ONE TABLET BY MOUTH AT BEDTIME 90 tablet 1  . Multiple Vitamins-Minerals (ICAPS AREDS 2 PO) Take by mouth.    . naproxen sodium (ANAPROX) 220 MG tablet Take 220 mg by mouth 2 (two) times daily as needed.     . pantoprazole (PROTONIX) 40 MG tablet TAKE ONE TABLET (40 MG) BY MOUTH EVERY DAY 90 tablet 1  . Potassium 99 MG TABS Take by mouth.    . potassium chloride (K-DUR) 10 MEQ tablet Take 1 tablet (10 mEq total) by mouth 2 (two) times daily. 180 tablet 3  . SURE COMFORT PEN NEEDLES 31G X 5 MM MISC     . traZODone (DESYREL) 50 MG tablet Take 50 mg by mouth at bedtime.    . vitamin E 400 UNIT capsule Take 1,000 Units by mouth daily.     No current facility-administered medications for this visit.         Past Medical History:  Diagnosis Date  . Angina pectoris ([place])   . Atrial fibrillation ([place])   . Cervicalgia   . Chronic back pain   . COPD (chronic obstructive pulmonary disease) ([place])   . DDD (degenerative disc disease), lumbar   . Diabetes mellitus without complication ([place])   . Dysphagia   .  Hyperlipemia   . Hypertension   . Vulvovaginitis          Past Surgical History:  Procedure Laterality Date  . gall bladder    . melanoma     removal neck and back  . PERIPHERAL VASCULAR CATHETERIZATION Left 01/05/2016   Procedure: Lower Extremity Angiography;  Surgeon: Algernon Huxley, MD;  Location: [facility];  Service: Cardiovascular;  Laterality: Left;  . PERIPHERAL VASCULAR CATHETERIZATION  01/05/2016   Procedure: Lower Extremity Intervention;  Surgeon: Algernon Huxley, MD;  Location: [facility];  Service: Cardiovascular;;  . ureterolithiasis     calculus removed  . VAGINAL HYSTERECTOMY    . VULVA / PERINEUM BIOPSY  05/29/2015    Social History     Social History  Substance Use Topics  . Smoking status: Never Smoker  .  Smokeless tobacco: Never Used  . Alcohol use No      Family History      Family History  Problem Relation Age of Onset  . Ovarian cancer Other   . Diabetes Sister   . Colon cancer Brother   . Diabetes Brother   . Breast cancer Neg Hx           Allergies  Allergen Reactions  . Neomycin-Bacitracin Zn-Polymyx Swelling  . Benzalkonium Chloride Itching and Swelling  . Ibuprofen     Other reaction(s): Dizziness Heart fluttering. Tachycardia. Tachycardia.  . Valdecoxib Nausea And Vomiting  . Albuterol Rash  . Latex Rash  . Tape Rash    blisters  . Triamcinolone Rash     REVIEW OF SYSTEMS (Negative unless checked)  Constitutional: [] Weight loss  [] Fever  [] Chills Cardiac: [] Chest pain   [] Chest pressure   [] Palpitations   [] Shortness of breath when laying flat   [] Shortness of breath at rest   [] Shortness of breath with exertion. Vascular:  [x] Pain in legs with walking   [] Pain in legs at rest   [] Pain in legs when laying flat   [] Claudication   [] Pain in feet when walking  [] Pain in feet at rest  [] Pain in feet when laying flat   [] History of DVT   [] Phlebitis   [] Swelling in legs   [] Varicose veins   [] Non-healing ulcers Pulmonary:   [] Uses home oxygen   [] Productive cough   [] Hemoptysis   [] Wheeze  [] COPD   [] Asthma Neurologic:  [] Dizziness  [] Blackouts   [] Seizures   [] History of stroke   [] History of TIA  [] Aphasia   [] Temporary blindness   [] Dysphagia   [] Weakness or numbness in arms   [] Weakness or numbness in legs Musculoskeletal:  [x] Arthritis   [] Joint swelling   [x] Joint pain   [] Low back pain Hematologic:  [x] Easy bruising  [] Easy bleeding   [] Hypercoagulable state   [] Anemic   Gastrointestinal:  [] Blood in stool   [] Vomiting blood  [] Gastroesophageal reflux/heartburn   [] Abdominal pain Genitourinary:  [] Chronic kidney disease   [] Difficult urination  [] Frequent urination  [] Burning with urination   [] Hematuria Skin:  [] Rashes   [] Ulcers    [] Wounds Psychological:  [] History of anxiety   []  History of major depression.  Physical Examination  BP 131/67   Pulse 74   Resp 16   Ht 5' 3"  (1.6 m)   Wt 151 lb (68.5 kg)   BMI 26.75 kg/m  Gen:  WD/WN, NAD appears younger than stated age.  Head: [place]/AT, No temporalis wasting. Ear/Nose/Throat: Hearing grossly intact, nares w/o erythema or drainage, trachea midline Eyes: Conjunctiva  clear. Sclera non-icteric Neck: Supple.  No JVD.  Pulmonary:  Good air movement, no use of accessory muscles.  Cardiac: RRR, normal S1, S2 Vascular:  Vessel Right Left  Radial Palpable Palpable  Ulnar Palpable Palpable  Brachial Palpable Palpable  Carotid Palpable, without bruit Palpable, without bruit  Aorta Not palpable N/A  Femoral Palpable Palpable  Popliteal Palpable Palpable  PT Palpable Palpable  DP Not Palpable Trace Palpable   Gastrointestinal: soft, non-tender/non-distended. No guarding/reflex.  Musculoskeletal: M/S 5/5 throughout.  No deformity or atrophy. 1+ bilateral lower extremity edema. Walk with a cane Neurologic: Sensation grossly intact in extremities.  Symmetrical.  Speech is fluent.  Psychiatric: Judgment intact, Mood & affect appropriate for pt's clinical situation. Dermatologic: No rashes or ulcers noted.  No cellulitis or open wounds. Lymph : No Cervical, Axillary, or Inguinal lymphadenopathy.      Labs Recent Results (from the past 2160 hour(s))  Basic metabolic panel     Status: Abnormal   Collection Time: 07/01/16  4:56 PM  Result Value Ref Range   Sodium 133 (L) 135 - 145 mmol/L   Potassium 2.6 (LL) 3.5 - 5.1 mmol/L    Comment: CRITICAL RESULT CALLED TO, READ BACK BY AND VERIFIED WITH Velta Addison RN AT 4315 07/01/16 MSS.    Chloride 92 (L) 101 - 111 mmol/L   CO2 31 22 - 32 mmol/L   Glucose, Bld 138 (H) 65 - 99 mg/dL   BUN 18 6 - 20 mg/dL   Creatinine, Ser 1.12 (H) 0.44 - 1.00 mg/dL   Calcium 9.2 8.9 - 10.3 mg/dL   GFR calc non Af Amer 44 (L) >60  mL/min   GFR calc Af Amer 50 (L) >60 mL/min    Comment: (NOTE) The eGFR has been calculated using the CKD EPI equation. This calculation has not been validated in all clinical situations. eGFR's persistently <60 mL/min signify possible Chronic Kidney Disease.    Anion gap 10 5 - 15  CBC     Status: Abnormal   Collection Time: 07/01/16  4:56 PM  Result Value Ref Range   WBC 12.0 (H) 3.6 - 11.0 K/uL   RBC 4.81 3.80 - 5.20 MIL/uL   Hemoglobin 13.1 12.0 - 16.0 g/dL   HCT 39.5 35.0 - 47.0 %   MCV 82.0 80.0 - 100.0 fL   MCH 27.3 26.0 - 34.0 pg   MCHC 33.3 32.0 - 36.0 g/dL   RDW 15.3 (H) 11.5 - 14.5 %   Platelets 306 150 - 440 K/uL  Troponin I     Status: None   Collection Time: 07/01/16  4:56 PM  Result Value Ref Range   Troponin I <0.03 <0.03 ng/mL  Protime-INR     Status: None   Collection Time: 07/01/16  4:56 PM  Result Value Ref Range   Prothrombin Time 12.4 11.4 - 15.2 seconds   INR 0.92   APTT     Status: None   Collection Time: 07/01/16  4:56 PM  Result Value Ref Range   aPTT 32 24 - 36 seconds  Glucose, capillary     Status: Abnormal   Collection Time: 07/01/16  7:25 PM  Result Value Ref Range   Glucose-Capillary 56 (L) 65 - 99 mg/dL  Glucose, capillary     Status: Abnormal   Collection Time: 07/01/16  8:15 PM  Result Value Ref Range   Glucose-Capillary 130 (H) 65 - 99 mg/dL  Troponin I     Status: None   Collection Time:  07/01/16  8:16 PM  Result Value Ref Range   Troponin I <0.03 <0.03 ng/mL  Magnesium     Status: Abnormal   Collection Time: 07/01/16 11:06 PM  Result Value Ref Range   Magnesium 1.6 (L) 1.7 - 2.4 mg/dL  Troponin I     Status: None   Collection Time: 07/01/16 11:06 PM  Result Value Ref Range   Troponin I <0.03 <0.03 ng/mL  Troponin I     Status: None   Collection Time: 07/02/16  5:00 AM  Result Value Ref Range   Troponin I <0.03 <0.03 ng/mL  Glucose, capillary     Status: Abnormal   Collection Time: 07/02/16  7:33 AM  Result Value Ref  Range   Glucose-Capillary 141 (H) 65 - 99 mg/dL  NM Myocar Multi W/Spect W/Wall Motion / EF     Status: None   Collection Time: 07/02/16 10:10 AM  Result Value Ref Range   Rest HR 75 bpm   Rest BP 149/62 mmHg   Exercise duration (sec) 1 sec   Percent HR 77 %   Exercise duration (min) 1 min   Estimated workload 1.0 METS   Peak HR 105 bpm   Peak BP 187/83 mmHg   MPHR 135 bpm   SSS 12    SRS 2    SDS 12    TID 0.64    LV sys vol 23 mL   LV dias vol 52 46 - 106 mL  Troponin I     Status: None   Collection Time: 07/02/16 10:42 AM  Result Value Ref Range   Troponin I <0.03 <0.03 ng/mL  Heparin level (unfractionated)     Status: Abnormal   Collection Time: 07/02/16 10:42 AM  Result Value Ref Range   Heparin Unfractionated 0.18 (L) 0.30 - 0.70 IU/mL    Comment:        IF HEPARIN RESULTS ARE BELOW EXPECTED VALUES, AND PATIENT DOSAGE HAS BEEN CONFIRMED, SUGGEST FOLLOW UP TESTING OF ANTITHROMBIN III LEVELS.   CBC     Status: Abnormal   Collection Time: 07/02/16 10:42 AM  Result Value Ref Range   WBC 9.2 3.6 - 11.0 K/uL   RBC 4.42 3.80 - 5.20 MIL/uL   Hemoglobin 12.5 12.0 - 16.0 g/dL   HCT 36.0 35.0 - 47.0 %   MCV 81.5 80.0 - 100.0 fL   MCH 28.4 26.0 - 34.0 pg   MCHC 34.8 32.0 - 36.0 g/dL   RDW 15.4 (H) 11.5 - 14.5 %   Platelets 267 150 - 440 K/uL  Glucose, capillary     Status: Abnormal   Collection Time: 07/02/16 11:20 AM  Result Value Ref Range   Glucose-Capillary 190 (H) 65 - 99 mg/dL  Heparin level (unfractionated)     Status: Abnormal   Collection Time: 07/02/16  3:11 PM  Result Value Ref Range   Heparin Unfractionated <0.10 (L) 0.30 - 0.70 IU/mL    Comment:        IF HEPARIN RESULTS ARE BELOW EXPECTED VALUES, AND PATIENT DOSAGE HAS BEEN CONFIRMED, SUGGEST FOLLOW UP TESTING OF ANTITHROMBIN III LEVELS.   Magnesium     Status: Abnormal   Collection Time: 07/02/16  3:11 PM  Result Value Ref Range   Magnesium 1.6 (L) 1.7 - 2.4 mg/dL  Potassium     Status: None     Collection Time: 07/02/16  3:11 PM  Result Value Ref Range   Potassium 3.6 3.5 - 5.1 mmol/L  Glucose, capillary  Status: Abnormal   Collection Time: 07/02/16  4:54 PM  Result Value Ref Range   Glucose-Capillary 142 (H) 65 - 99 mg/dL  Glucose, capillary     Status: Abnormal   Collection Time: 07/02/16  6:10 PM  Result Value Ref Range   Glucose-Capillary 156 (H) 65 - 99 mg/dL    Radiology No results found.    Assessment/Plan  Type 2 diabetes mellitus (HCC) blood glucose control important in reducing the progression of atherosclerotic disease. Also, involved in wound healing. On appropriate medications.   Benign essential HTN blood pressure control important in reducing the progression of atherosclerotic disease. On appropriate oral medications.   Intermittent claudication (HCC) Her ABIs today are normal with digit pressures in the 90s bilaterally. Good waveforms bilaterally. Currently she does not have any severe ischemia and has done well after an intervention about 9 months ago. We will plan to recheck this in 6 months or sooner if problems develop in the interim.  Carotid artery narrowing Checked last year.  Follow up planned for later this year.    Leotis Pain, MD  09/28/2016 9:26 AM    This note was created with Dragon medical transcription system.  Any errors from dictation are purely unintentional

## 2016-09-28 NOTE — Assessment & Plan Note (Signed)
Checked last year.  Follow up planned for later this year.

## 2016-10-01 DIAGNOSIS — N183 Chronic kidney disease, stage 3 (moderate): Secondary | ICD-10-CM | POA: Diagnosis not present

## 2016-10-01 DIAGNOSIS — Z794 Long term (current) use of insulin: Secondary | ICD-10-CM | POA: Diagnosis not present

## 2016-10-01 DIAGNOSIS — E1122 Type 2 diabetes mellitus with diabetic chronic kidney disease: Secondary | ICD-10-CM | POA: Diagnosis not present

## 2016-10-01 DIAGNOSIS — E1159 Type 2 diabetes mellitus with other circulatory complications: Secondary | ICD-10-CM | POA: Diagnosis not present

## 2016-10-07 ENCOUNTER — Ambulatory Visit (INDEPENDENT_AMBULATORY_CARE_PROVIDER_SITE_OTHER): Payer: Medicare Other

## 2016-10-07 ENCOUNTER — Ambulatory Visit: Payer: Medicare Other | Admitting: Family Medicine

## 2016-10-07 VITALS — BP 131/59 | HR 72 | Temp 98.1°F | Ht 64.0 in | Wt 153.2 lb

## 2016-10-07 VITALS — BP 131/60 | HR 72 | Temp 98.1°F | Wt 153.0 lb

## 2016-10-07 DIAGNOSIS — Z0000 Encounter for general adult medical examination without abnormal findings: Secondary | ICD-10-CM

## 2016-10-07 DIAGNOSIS — I10 Essential (primary) hypertension: Secondary | ICD-10-CM

## 2016-10-07 NOTE — Patient Instructions (Signed)
Health Maintenance, Female Introduction Adopting a healthy lifestyle and getting preventive care can go a long way to promote health and wellness. Talk with your health care provider about what schedule of regular examinations is right for you. This is a good chance for you to check in with your provider about disease prevention and staying healthy. In between checkups, there are plenty of things you can do on your own. Experts have done a lot of research about which lifestyle changes and preventive measures are most likely to keep you healthy. Ask your health care provider for more information. Weight and diet Eat a healthy diet  Be sure to include plenty of vegetables, fruits, low-fat dairy products, and lean protein.  Do not eat a lot of foods high in solid fats, added sugars, or salt.  Get regular exercise. This is one of the most important things you can do for your health.  Most adults should exercise for at least 150 minutes each week. The exercise should increase your heart rate and make you sweat (moderate-intensity exercise).  Most adults should also do strengthening exercises at least twice a week. This is in addition to the moderate-intensity exercise. Maintain a healthy weight  Body mass index (BMI) is a measurement that can be used to identify possible weight problems. It estimates body fat based on height and weight. Your health care provider can help determine your BMI and help you achieve or maintain a healthy weight.  For females 4 years of age and older:  A BMI below 18.5 is considered underweight.  A BMI of 18.5 to 24.9 is normal.  A BMI of 25 to 29.9 is considered overweight.  A BMI of 30 and above is considered obese. Watch levels of cholesterol and blood lipids  You should start having your blood tested for lipids and cholesterol at 81 years of age, then have this test every 5 years.  You may need to have your cholesterol levels checked more often  if:  Your lipid or cholesterol levels are high.  You are older than 81 years of age.  You are at high risk for heart disease. Cancer screening Lung Cancer  Lung cancer screening is recommended for adults 12-31 years old who are at high risk for lung cancer because of a history of smoking.  A yearly low-dose CT scan of the lungs is recommended for people who:  Currently smoke.  Have quit within the past 15 years.  Have at least a 30-pack-year history of smoking. A pack year is smoking an average of one pack of cigarettes a day for 1 year.  Yearly screening should continue until it has been 15 years since you quit.  Yearly screening should stop if you develop a health problem that would prevent you from having lung cancer treatment. Breast Cancer  Practice breast self-awareness. This means understanding how your breasts normally appear and feel.  It also means doing regular breast self-exams. Let your health care provider know about any changes, no matter how small.  If you are in your 20s or 30s, you should have a clinical breast exam (CBE) by a health care provider every 1-3 years as part of a regular health exam.  If you are 74 or older, have a CBE every year. Also consider having a breast X-ray (mammogram) every year.  If you have a family history of breast cancer, talk to your health care provider about genetic screening.  If you are at high risk for breast cancer,  talk to your health care provider about having an MRI and a mammogram every year.  Breast cancer gene (BRCA) assessment is recommended for women who have family members with BRCA-related cancers. BRCA-related cancers include:  Breast.  Ovarian.  Tubal.  Peritoneal cancers.  Results of the assessment will determine the need for genetic counseling and BRCA1 and BRCA2 testing. Colorectal Cancer  This type of cancer can be detected and often prevented.  Routine colorectal cancer screening usually begins  at 81 years of age and continues through 81 years of age.  Your health care provider may recommend screening at an earlier age if you have risk factors for colon cancer.  Your health care provider may also recommend using home test kits to check for hidden blood in the stool.  A small camera at the end of a tube can be used to examine your colon directly (sigmoidoscopy or colonoscopy). This is done to check for the earliest forms of colorectal cancer.  Routine screening usually begins at age 50.  Direct examination of the colon should be repeated every 5-10 years through 81 years of age. However, you may need to be screened more often if early forms of precancerous polyps or small growths are found. Skin Cancer  Check your skin from head to toe regularly.  Tell your health care provider about any new moles or changes in moles, especially if there is a change in a mole's shape or color.  Also tell your health care provider if you have a mole that is larger than the size of a pencil eraser.  Always use sunscreen. Apply sunscreen liberally and repeatedly throughout the day.  Protect yourself by wearing long sleeves, pants, a wide-brimmed hat, and sunglasses whenever you are outside. Heart disease, diabetes, and high blood pressure  High blood pressure causes heart disease and increases the risk of stroke. High blood pressure is more likely to develop in:  People who have blood pressure in the high end of the normal range (130-139/85-89 mm Hg).  People who are overweight or obese.  People who are African American.  If you are 18-39 years of age, have your blood pressure checked every 3-5 years. If you are 40 years of age or older, have your blood pressure checked every year. You should have your blood pressure measured twice-once when you are at a hospital or clinic, and once when you are not at a hospital or clinic. Record the average of the two measurements. To check your blood pressure  when you are not at a hospital or clinic, you can use:  An automated blood pressure machine at a pharmacy.  A home blood pressure monitor.  If you are between 55 years and 79 years old, ask your health care provider if you should take aspirin to prevent strokes.  Have regular diabetes screenings. This involves taking a blood sample to check your fasting blood sugar level.  If you are at a normal weight and have a low risk for diabetes, have this test once every three years after 81 years of age.  If you are overweight and have a high risk for diabetes, consider being tested at a younger age or more often. Preventing infection Hepatitis B  If you have a higher risk for hepatitis B, you should be screened for this virus. You are considered at high risk for hepatitis B if:  You were born in a country where hepatitis B is common. Ask your health care provider which countries are   considered high risk.  Your parents were born in a high-risk country, and you have not been immunized against hepatitis B (hepatitis B vaccine).  You have HIV or AIDS.  You use needles to inject street drugs.  You live with someone who has hepatitis B.  You have had sex with someone who has hepatitis B.  You get hemodialysis treatment.  You take certain medicines for conditions, including cancer, organ transplantation, and autoimmune conditions. Hepatitis C  Blood testing is recommended for:  Everyone born from 1945 through 1965.  Anyone with known risk factors for hepatitis C. Osteoporosis and menopause  Osteoporosis is a disease in which the bones lose minerals and strength with aging. This can result in serious bone fractures. Your risk for osteoporosis can be identified using a bone density scan.  If you are 65 years of age or older, or if you are at risk for osteoporosis and fractures, ask your health care provider if you should be screened.  Ask your health care provider whether you should take  a calcium or vitamin D supplement to lower your risk for osteoporosis.  Menopause may have certain physical symptoms and risks.  Hormone replacement therapy may reduce some of these symptoms and risks. Talk to your health care provider about whether hormone replacement therapy is right for you. Follow these instructions at home:  Schedule regular health, dental, and eye exams.  Stay current with your immunizations.  Do not use any tobacco products including cigarettes, chewing tobacco, or electronic cigarettes.  If you are pregnant, do not drink alcohol.  If you are breastfeeding, limit how much and how often you drink alcohol.  Limit alcohol intake to no more than 1 drink per day for nonpregnant women. One drink equals 12 ounces of beer, 5 ounces of wine, or 1 ounces of hard liquor.  Do not use street drugs.  Do not share needles.  Ask your health care provider for help if you need support or information about quitting drugs.  Tell your health care provider if you often feel depressed.  Tell your health care provider if you have ever been abused or do not feel safe at home. This information is not intended to replace advice given to you by your health care provider. Make sure you discuss any questions you have with your health care provider. Document Released: 03/29/2011 Document Revised: 02/19/2016 Document Reviewed: 06/17/2015  2017 Elsevier  

## 2016-10-07 NOTE — Progress Notes (Signed)
Subjective:   [name] is a 83 y.o. female who presents for Medicare Annual (Subsequent) preventive examination.  Review of Systems:  N/A  Cardiac Risk Factors include: advanced age (>28men, >79 women);diabetes mellitus;dyslipidemia;hypertension     Objective:     Vitals: BP (!) 131/59 (BP Location: Right Arm)   Pulse 72   Temp 98.1 F (36.7 C) (Oral)   Ht 5\' 4"  (1.626 m)   Wt 153 lb 3.2 oz (69.5 kg)   BMI 26.30 kg/m   Body mass index is 26.3 kg/m.   Tobacco History  Smoking Status  . Never Smoker  Smokeless Tobacco  . Never Used     Counseling given: Not Answered   Past Medical History:  Diagnosis Date  . Angina pectoris ([place])   . Atrial fibrillation ([place])   . Cervicalgia   . Chronic back pain   . Chronic obstructive pulmonary disease (COPD) ([place])   . COPD (chronic obstructive pulmonary disease) ([place])   . DDD (degenerative disc disease), lumbar   . Diabetes mellitus without complication ([place])   . Dysphagia   . Hyperlipemia   . Hypertension   . Vulvovaginitis    Past Surgical History:  Procedure Laterality Date  . gall bladder    . melanoma     removal neck and back  . PERIPHERAL VASCULAR CATHETERIZATION Left 01/05/2016   Procedure: Lower Extremity Angiography;  Surgeon: Algernon Huxley, MD;  Location: [facility];  Service: Cardiovascular;  Laterality: Left;  . PERIPHERAL VASCULAR CATHETERIZATION  01/05/2016   Procedure: Lower Extremity Intervention;  Surgeon: Algernon Huxley, MD;  Location: [facility];  Service: Cardiovascular;;  . ureterolithiasis     calculus removed  . VAGINAL HYSTERECTOMY    . VULVA / PERINEUM BIOPSY  05/29/2015   Family History  Problem Relation Age of Onset  . Ovarian cancer Other   . Diabetes Sister   . Colon cancer Brother   . Diabetes Brother   . Breast cancer Neg Hx    History  Sexual Activity  . Sexual activity: No    Outpatient Encounter Prescriptions as of 10/07/2016  Medication Sig  .  acetaminophen (TYLENOL) 500 MG tablet Take 1,000 mg by mouth every 6 (six) hours as needed.  . ALPRAZolam (XANAX) 0.25 MG tablet Take 0.5-1 tablets (0.125-0.25 mg total) by mouth at bedtime.  . Ascorbic Acid (VITAMIN C) 1000 MG tablet Take 1,000 mg by mouth daily.   Marland Kitchen aspirin 325 MG tablet Take 325 mg by mouth daily. Reported on 01/05/2016  . ASSURE COMFORT LANCETS 30G MISC   . benzonatate (TESSALON) 100 MG capsule TAKE 1 CAPSULE 3 TIMES DAILY AS NEEDED FOR COUGH  . Blood Glucose Calibration (ACCU-CHEK SMARTVIEW CONTROL) LIQD   . cetirizine (ZYRTEC) 10 MG tablet TAKE ONE TABLET BY MOUTH EVERY DAY  . diltiazem (CARDIZEM CD) 180 MG 24 hr capsule TAKE 1 CAPSULE BY MOUTH EVERY DAY  . FLUoxetine (PROZAC) 40 MG capsule   . glucose blood test strip ACCU-CHEK AVIVA PLUS TEST STRP Use to check sugars 3 times daily.  . insulin glargine (LANTUS) 100 UNIT/ML injection Inject 0.05 mLs (5 Units total) into the skin at bedtime. (Patient taking differently: Inject 10 Units into the skin at bedtime. )  . insulin lispro (HUMALOG KWIKPEN) 100 UNIT/ML KiwkPen Inject into the skin. 12 units in the am, 10 units at lunch and 12 units in the pm  . losartan-hydrochlorothiazide (HYZAAR) 100-25 MG tablet Take 1 tablet by  mouth daily.  . Magnesium 400 MG CAPS Take by mouth daily.  . montelukast (SINGULAIR) 10 MG tablet TAKE ONE TABLET BY MOUTH AT BEDTIME  . Multiple Vitamins-Minerals (ICAPS AREDS 2 PO) Take by mouth.  . naproxen sodium (ANAPROX) 220 MG tablet Take 220 mg by mouth 2 (two) times daily as needed.   . pantoprazole (PROTONIX) 40 MG tablet TAKE ONE TABLET BY MOUTH EVERY DAY  . Potassium 99 MG TABS Take by mouth.  . potassium chloride (K-DUR) 10 MEQ tablet Take 1 tablet (10 mEq total) by mouth 2 (two) times daily.  . SURE COMFORT PEN NEEDLES 31G X 5 MM MISC   . traZODone (DESYREL) 50 MG tablet Take 50 mg by mouth at bedtime.   . vitamin E 400 UNIT capsule Take 1,000 Units by mouth daily.  Marland Kitchen azithromycin  (ZITHROMAX) 250 MG tablet Take 2 tablets now, then one daily until finished. (Patient not taking: Reported on 10/07/2016)   No facility-administered encounter medications on file as of 10/07/2016.     Activities of Daily Living In your present state of health, do you have any difficulty performing the following activities: 10/07/2016 07/01/2016  Hearing? [name]  Vision? N N  Difficulty concentrating or making decisions? N N  Walking or climbing stairs? Y N  Dressing or bathing? N N  Doing errands, shopping? N N  Preparing Food and eating ? N -  Using the Toilet? N -  In the past six months, have you accidently leaked urine? Y -  Do you have problems with loss of bowel control? N -  Managing your Medications? N -  Managing your Finances? Y -  Housekeeping or managing your Housekeeping? N -  Some recent data might be hidden    Patient Care Team: Jerrol Banana., MD as PCP - General (Family Medicine)    Assessment:     Exercise Activities and Dietary recommendations Current Exercise Habits: The patient does not participate in regular exercise at present, Exercise limited by: orthopedic condition(s) (back pain)  Goals    . Increase water intake          Starting 10/07/2016,  I will continue to drink 6 glasses of water a day.      Fall Risk Fall Risk  10/07/2016 05/26/2016  Falls in the past year? No Yes  Number falls in past yr: - 1  Injury with Fall? - No   Depression Screen PHQ 2/9 Scores 10/07/2016 05/26/2016  PHQ - 2 Score 0 1     Cognitive Function        Immunization History  Administered Date(s) Administered  . Influenza, High Dose Seasonal PF 06/24/2015, 06/16/2016  . Pneumococcal Conjugate-13 11/28/2014  . Pneumococcal Polysaccharide-23 08/01/1995, 11/17/2012  . Td 05/20/2005   Screening Tests Health Maintenance  Topic Date Due  . HEMOGLOBIN A1C  Mar 10, 1929  . FOOT EXAM  03/18/1941  . OPHTHALMOLOGY EXAM  03/18/1941  . ZOSTAVAX  03/19/1991  .  TETANUS/TDAP  05/21/2015  . INFLUENZA VACCINE  Completed  . DEXA SCAN  Completed  . PNA vac Low Risk Adult  Completed      Plan:  I have personally reviewed and addressed the Medicare Annual Wellness questionnaire and have noted the following in the patient's chart:  A. Medical and social history B. Use of alcohol, tobacco or illicit drugs  C. Current medications and supplements D. Functional ability and status E.  Nutritional status F.  Physical activity G. Advance directives H. List  of other physicians I.  Hospitalizations, surgeries, and ER visits in previous 12 months J.  [place] such as hearing and vision if needed, cognitive and depression L. Referrals and appointments - none  In addition, I have reviewed and discussed with patient certain preventive protocols, quality metrics, and best practice recommendations. A written personalized care plan for preventive services as well as general preventive health recommendations were provided to patient.  See attached scanned questionnaire for additional information.   Signed,  [name], LPN Nurse Health Advisor   MD Recommendations: Pt to follow up with PCP about getting the tetanus vaccine.   I have reviewed the health advisors note, was  available for consultation and I agree with documentation and plan. Miguel Aschoff MD [facility]

## 2016-10-07 NOTE — Progress Notes (Signed)
[name]  MRN: [id number] DOB: 02/19/1929  Subjective:  HPI   The patient is an 83 year old female who presents for follow up of her hypokalemia, hallucinations, anxiety, and MCI. Patient states she is sleeping well most of the time with the Xanax.  She does have occasion where she has trouble sleeping.   She continues to have hallucinations.  She states she only has visual hallucinations and most of the time she is seeing her first husband.  She is not frightened by the sights and has told him to go home.   The patient had her last potassium check on 07/02/16 while in the hospital and it was 3.6.  Patient Active Problem List   Diagnosis Date Noted  . Pain in limb 07/27/2016  . Hallucinations 07/07/2016  . Chest pain 07/01/2016  . Hypokalemia 09/03/2015  . Insomnia 07/23/2015  . Headache 07/23/2015  . Type 2 diabetes mellitus ([place]) 06/24/2015  . Chronic vulvitis 06/02/2015  . Allergic rhinitis 05/30/2015  . Absolute anemia 05/30/2015  . Back ache 05/30/2015  . Body mass index (BMI) of 29.0-29.9 in adult 05/30/2015  . Edema extremities 05/30/2015  . Dilatation of esophagus 05/30/2015  . Fall 05/30/2015  . H/O deep venous thrombosis 05/30/2015  . Hypercholesteremia 05/30/2015  . Heart & renal disease, hypertensive, with heart failure ([place]) 05/30/2015  . Below normal amount of sodium in the blood 05/30/2015  . Calculus of kidney 05/30/2015  . Gonalgia 05/30/2015  . Psoriasis 05/30/2015  . Avitaminosis D 05/30/2015  . Cough 04/01/2015  . Combined fat and carbohydrate induced hyperlipemia 12/30/2014  . Paroxysmal atrial fibrillation ([place]) 07/10/2014  . Abnormal gait 07/08/2014  . Anxiety state 07/08/2014  . Chronic kidney disease 07/08/2014  . Acid reflux 07/08/2014  . 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[place]) 07/08/2014  . Chronic obstructive pulmonary disease ([place]) 07/08/2014  . Benign essential HTN 06/26/2014  . Carotid artery narrowing 06/26/2014  . Intermittent  claudication ([place]) 06/26/2014  . Basal cell carcinoma of face 05/01/2014  . Disease of female genital organs 11/28/2012  . Incomplete bladder emptying 09/05/2012  . Excessive urination at night 08/15/2012  . Basal cell carcinoma of ear 10/26/2011    Past Medical History:  Diagnosis Date  . Angina pectoris ([place])   . Atrial fibrillation ([place])   . Cervicalgia   . Chronic back pain   . Chronic obstructive pulmonary disease (COPD) ([place])   . COPD (chronic obstructive pulmonary disease) ([place])   . DDD (degenerative disc disease), lumbar   . Diabetes mellitus without complication ([place])   . Dysphagia   . Hyperlipemia   . Hypertension   . Vulvovaginitis     Social History   Social History  . Marital status: Widowed    Spouse name: N/A  . Number of children: N/A  . Years of education: N/A   Occupational History  . Not on file.   Social History Main Topics  . Smoking status: Never Smoker  . Smokeless tobacco: Never Used  . Alcohol use No  . Drug use: No  . Sexual activity: No   Other Topics Concern  . Not on file   Social History Narrative  . No narrative on file    Outpatient Encounter Prescriptions as of 10/07/2016  Medication Sig Note  . acetaminophen (TYLENOL) 500 MG tablet Take 1,000 mg by mouth every 6 (six) hours as needed.   . ALPRAZolam (XANAX) 0.25 MG tablet Take 0.5-1 tablets (0.125-0.25 mg total) by mouth at bedtime.   Marland Kitchen  Ascorbic Acid (VITAMIN C) 1000 MG tablet Take 1,000 mg by mouth daily.    Marland Kitchen aspirin 325 MG tablet Take 325 mg by mouth daily. Reported on 01/05/2016   . ASSURE COMFORT LANCETS 30G MISC    . benzonatate (TESSALON) 100 MG capsule TAKE 1 CAPSULE 3 TIMES DAILY AS NEEDED FOR COUGH   . Blood Glucose Calibration (ACCU-CHEK SMARTVIEW CONTROL) LIQD    . cetirizine (ZYRTEC) 10 MG tablet TAKE ONE TABLET BY MOUTH EVERY DAY   . diltiazem (CARDIZEM CD) 180 MG 24 hr capsule TAKE 1 CAPSULE BY MOUTH EVERY DAY   . FLUoxetine (PROZAC) 40 MG capsule  09/28/2016:  Received from: External Pharmacy  . glucose blood test strip ACCU-CHEK AVIVA PLUS TEST STRP Use to check sugars 3 times daily.   . insulin glargine (LANTUS) 100 UNIT/ML injection Inject 0.05 mLs (5 Units total) into the skin at bedtime. (Patient taking differently: Inject 10 Units into the skin at bedtime. )   . insulin lispro (HUMALOG KWIKPEN) 100 UNIT/ML KiwkPen Inject into the skin. 12 units in the am, 10 units at lunch and 12 units in the pm   . losartan-hydrochlorothiazide (HYZAAR) 100-25 MG tablet Take 1 tablet by mouth daily.   . Magnesium 400 MG CAPS Take by mouth daily.   . montelukast (SINGULAIR) 10 MG tablet TAKE ONE TABLET BY MOUTH AT BEDTIME   . Multiple Vitamins-Minerals (ICAPS AREDS 2 PO) Take by mouth.   . naproxen sodium (ANAPROX) 220 MG tablet Take 220 mg by mouth 2 (two) times daily as needed.    . pantoprazole (PROTONIX) 40 MG tablet TAKE ONE TABLET BY MOUTH EVERY DAY   . Potassium 99 MG TABS Take by mouth.   . potassium chloride (K-DUR) 10 MEQ tablet Take 1 tablet (10 mEq total) by mouth 2 (two) times daily.   . SURE COMFORT PEN NEEDLES 31G X 5 MM MISC    . traZODone (DESYREL) 50 MG tablet Take 50 mg by mouth at bedtime.    . vitamin E 400 UNIT capsule Take 1,000 Units by mouth daily.   . [DISCONTINUED] azithromycin (ZITHROMAX) 250 MG tablet Take 2 tablets now, then one daily until finished. (Patient not taking: Reported on 10/07/2016)    No facility-administered encounter medications on file as of 10/07/2016.     Allergies  Allergen Reactions  . Neomycin-Bacitracin Zn-Polymyx Swelling  . Benzalkonium Chloride Itching and Swelling  . Ibuprofen     Other reaction(s): Dizziness Heart fluttering. Tachycardia. Tachycardia.  . Valdecoxib Nausea And Vomiting  . Albuterol Rash  . Latex Rash  . Tape Rash    blisters  . Triamcinolone Rash    Review of Systems  Constitutional: Positive for malaise/fatigue. Negative for fever and weight loss.  Respiratory: Positive for  cough. Negative for sputum production, shortness of breath and wheezing.   Cardiovascular: Negative for chest pain, palpitations, orthopnea, claudication, leg swelling and PND.  Gastrointestinal: Negative.   Neurological: Negative for weakness.  Endo/Heme/Allergies: Negative.   Psychiatric/Behavioral: Positive for hallucinations. Negative for depression, memory loss, substance abuse and suicidal ideas. The patient is not nervous/anxious and does not have insomnia.     Objective:  BP 131/60   Pulse 72   Temp 98.1 F (36.7 C) (Oral)   Wt 153 lb (69.4 kg)   BMI 26.26 kg/m   Physical Exam  Constitutional: She is well-developed, well-nourished, and in no distress.  HENT:  Head: Normocephalic and atraumatic.  Right Ear: External ear normal.  Left Ear: External  ear normal.  Nose: Nose normal.  Eyes: Conjunctivae are normal. No scleral icterus.  Neck: No thyromegaly present.  Cardiovascular: Normal rate, regular rhythm and normal heart sounds.   Pulmonary/Chest: Effort normal and breath sounds normal.  Abdominal: Soft.  Neurological: She is alert. Gait normal.  Skin: Skin is warm and dry.  Psychiatric: Mood and affect normal.    Assessment and Plan :  1. Benign essential HTN  - Comprehensive metabolic panel - TSH 2. Lewy body dementia Patient is advised not to drive. She is also been told this by neurology. MMSE on next visit. 3. Type 2 diabetes Per endocrinology. 4. Hypertension 5.GERD  I have done the exam and reviewed the chart and it is accurate to the best of my knowledge. Development worker, community has been used and  any errors in dictation or transcription are unintentional. Miguel Aschoff M.D. [facility]

## 2016-10-08 LAB — COMPREHENSIVE METABOLIC PANEL
ALK PHOS: 93 IU/L (ref 39–117)
ALT: 13 IU/L (ref 0–32)
AST: 15 IU/L (ref 0–40)
Albumin/Globulin Ratio: 1.6 (ref 1.2–2.2)
Albumin: 4.4 g/dL (ref 3.5–4.7)
BILIRUBIN TOTAL: 0.3 mg/dL (ref 0.0–1.2)
BUN/Creatinine Ratio: 15 (ref 12–28)
BUN: 18 mg/dL (ref 8–27)
CHLORIDE: 92 mmol/L — AB (ref 96–106)
CO2: 29 mmol/L (ref 18–29)
Calcium: 9.4 mg/dL (ref 8.7–10.3)
Creatinine, Ser: 1.17 mg/dL — ABNORMAL HIGH (ref 0.57–1.00)
GFR calc Af Amer: 49 mL/min/{1.73_m2} — ABNORMAL LOW (ref >59)
GFR calc non Af Amer: 43 mL/min/{1.73_m2} — ABNORMAL LOW (ref >59)
GLUCOSE: 83 mg/dL (ref 65–99)
Globulin, Total: 2.8 g/dL (ref 1.5–4.5)
Potassium: 4.3 mmol/L (ref 3.5–5.2)
Sodium: 138 mmol/L (ref 134–144)
Total Protein: 7.2 g/dL (ref 6.0–8.5)

## 2016-10-08 LAB — TSH: TSH: 2.35 u[IU]/mL (ref 0.450–4.500)

## 2016-11-19 DIAGNOSIS — Z794 Long term (current) use of insulin: Secondary | ICD-10-CM | POA: Diagnosis not present

## 2016-11-19 DIAGNOSIS — E11649 Type 2 diabetes mellitus with hypoglycemia without coma: Secondary | ICD-10-CM | POA: Diagnosis not present

## 2016-12-13 ENCOUNTER — Other Ambulatory Visit: Payer: Self-pay | Admitting: Family Medicine

## 2017-01-11 ENCOUNTER — Other Ambulatory Visit: Payer: Self-pay | Admitting: Family Medicine

## 2017-01-11 DIAGNOSIS — I10 Essential (primary) hypertension: Secondary | ICD-10-CM

## 2017-01-25 DIAGNOSIS — Z794 Long term (current) use of insulin: Secondary | ICD-10-CM | POA: Diagnosis not present

## 2017-01-25 DIAGNOSIS — E1122 Type 2 diabetes mellitus with diabetic chronic kidney disease: Secondary | ICD-10-CM | POA: Diagnosis not present

## 2017-01-25 DIAGNOSIS — N183 Chronic kidney disease, stage 3 (moderate): Secondary | ICD-10-CM | POA: Diagnosis not present

## 2017-01-25 LAB — HEMOGLOBIN A1C: HEMOGLOBIN A1C: 6.8

## 2017-02-01 DIAGNOSIS — N183 Chronic kidney disease, stage 3 (moderate): Secondary | ICD-10-CM | POA: Diagnosis not present

## 2017-02-01 DIAGNOSIS — Z794 Long term (current) use of insulin: Secondary | ICD-10-CM | POA: Diagnosis not present

## 2017-02-01 DIAGNOSIS — E1159 Type 2 diabetes mellitus with other circulatory complications: Secondary | ICD-10-CM | POA: Diagnosis not present

## 2017-02-01 DIAGNOSIS — E1122 Type 2 diabetes mellitus with diabetic chronic kidney disease: Secondary | ICD-10-CM | POA: Diagnosis not present

## 2017-02-03 ENCOUNTER — Ambulatory Visit (INDEPENDENT_AMBULATORY_CARE_PROVIDER_SITE_OTHER): Payer: Medicare Other | Admitting: Physician Assistant

## 2017-02-03 ENCOUNTER — Encounter: Payer: Self-pay | Admitting: Physician Assistant

## 2017-02-03 ENCOUNTER — Ambulatory Visit
Admission: RE | Admit: 2017-02-03 | Discharge: 2017-02-03 | Disposition: A | Discharge status: Home / Self Care | Payer: Medicare Other | Source: Ambulatory Visit | Attending: Physician Assistant | Admitting: Physician Assistant

## 2017-02-03 VITALS — BP 128/68 | HR 82 | Temp 99.2°F | Resp 16 | Wt 141.0 lb

## 2017-02-03 DIAGNOSIS — J449 Chronic obstructive pulmonary disease, unspecified: Secondary | ICD-10-CM

## 2017-02-03 DIAGNOSIS — E11649 Type 2 diabetes mellitus with hypoglycemia without coma: Secondary | ICD-10-CM

## 2017-02-03 DIAGNOSIS — R05 Cough: Secondary | ICD-10-CM | POA: Insufficient documentation

## 2017-02-03 DIAGNOSIS — R509 Fever, unspecified: Secondary | ICD-10-CM

## 2017-02-03 DIAGNOSIS — J029 Acute pharyngitis, unspecified: Secondary | ICD-10-CM

## 2017-02-03 DIAGNOSIS — R059 Cough, unspecified: Secondary | ICD-10-CM

## 2017-02-03 LAB — POCT INFLUENZA A/B
Influenza A, POC: NEGATIVE
Influenza B, POC: NEGATIVE

## 2017-02-03 LAB — GLUCOSE, POCT (MANUAL RESULT ENTRY): POC Glucose: 117 mg/dl — AB (ref 70–99)

## 2017-02-03 MED ORDER — AZITHROMYCIN 250 MG PO TABS: ORAL_TABLET | ORAL | 0 refills | Status: DC

## 2017-02-03 NOTE — Progress Notes (Signed)
[place]  Chief Complaint  Patient presents with  . URI    Subjective:    Patient ID: [name], female    DOB: [birth year], 83 y.o.   MRN: [id number]  Upper Respiratory Infection: [name] is a 83 y.o. female with a past medical history significant for Type II DM with insulin dependence, paroxysmal a-fib, and COPD complaining of symptoms of a URI. Symptoms include congestion, cough and sore throat. Onset of symptoms was 2 days ago, gradually worsening since that time. She also c/o bilateral ear pressure/pain, cough described as productive, facial pain, lightheadedness and nasal congestion for the past 2 days . She also has pharyngitis. Nonproductive cough. Chilly. She is not drinking much. Evaluation to date: none. Not treated yet.  Review of Systems  Constitutional: Positive for chills, diaphoresis and fatigue. Negative for activity change, appetite change, fever and unexpected weight change.  HENT: Positive for congestion, rhinorrhea, sinus pain, sinus pressure, sore throat and trouble swallowing. Negative for ear discharge, ear pain, nosebleeds and postnasal drip.   Eyes: Positive for discharge.  Respiratory: Positive for cough, chest tightness and shortness of breath. Negative for apnea, choking, wheezing and stridor.   Gastrointestinal: Negative.   Neurological: Positive for light-headedness and headaches. Negative for dizziness.       Objective:   BP 128/68 (BP Location: Left Arm, Patient Position: Sitting, Cuff Size: Normal)   Pulse 82   Temp 99.2 F (37.3 C) (Oral)   Resp 16   Wt 141 lb (64 kg)   SpO2 93%   BMI 24.20 kg/m   Patient Active Problem List   Diagnosis Date Noted  . Pain in limb 07/27/2016  . Hallucinations 07/07/2016  . Chest pain 07/01/2016  . Hypokalemia 09/03/2015  . Insomnia 07/23/2015  . Headache 07/23/2015  . Type 2 diabetes mellitus ([place]) 06/24/2015  . Chronic vulvitis 06/02/2015  . Allergic rhinitis  05/30/2015  . Absolute anemia 05/30/2015  . Back ache 05/30/2015  . Body mass index (BMI) of 29.0-29.9 in adult 05/30/2015  . Edema extremities 05/30/2015  . Dilatation of esophagus 05/30/2015  . Fall 05/30/2015  . H/O deep venous thrombosis 05/30/2015  . Hypercholesteremia 05/30/2015  . Heart & renal disease, hypertensive, with heart failure ([place]) 05/30/2015  . Below normal amount of sodium in the blood 05/30/2015  . Calculus of kidney 05/30/2015  . Gonalgia 05/30/2015  . Psoriasis 05/30/2015  . Avitaminosis D 05/30/2015  . Cough 04/01/2015  . Combined fat and carbohydrate induced hyperlipemia 12/30/2014  . Paroxysmal atrial fibrillation ([place]) 07/10/2014  . Abnormal gait 07/08/2014  . Anxiety state 07/08/2014  . Chronic kidney disease 07/08/2014  . Acid reflux 07/08/2014  . 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[place]) 07/08/2014  . Chronic obstructive pulmonary disease ([place]) 07/08/2014  . Benign essential HTN 06/26/2014  . Carotid artery narrowing 06/26/2014  . Intermittent claudication ([place]) 06/26/2014  . Basal cell carcinoma of face 05/01/2014  . Disease of female genital organs 11/28/2012  . Incomplete bladder emptying 09/05/2012  . Excessive urination at night 08/15/2012  . Basal cell carcinoma of ear 10/26/2011    Outpatient Encounter Prescriptions as of 02/03/2017  Medication Sig Note  . acetaminophen (TYLENOL) 500 MG tablet Take 1,000 mg by mouth every 6 (six) hours as needed.   . ALPRAZolam (XANAX) 0.25 MG tablet Take 0.5-1 tablets (0.125-0.25 mg total) by mouth at bedtime.   . Ascorbic Acid (VITAMIN C) 1000 MG tablet Take 1,000 mg by mouth daily.    Marland Kitchen aspirin 325  MG tablet Take 325 mg by mouth daily. Reported on 01/05/2016   . ASSURE COMFORT LANCETS 30G MISC    . Blood Glucose Calibration (ACCU-CHEK SMARTVIEW CONTROL) LIQD    . cetirizine (ZYRTEC) 10 MG tablet TAKE ONE TABLET BY MOUTH EVERY DAY   . diltiazem (CARDIZEM CD) 180 MG 24 hr capsule TAKE 1 CAPSULE BY MOUTH EVERY DAY    . FLUoxetine (PROZAC) 40 MG capsule TAKE 1 CAPSULE BY MOUTH DAILY   . glucose blood test strip ACCU-CHEK AVIVA PLUS TEST STRP Use to check sugars 3 times daily.   . insulin glargine (LANTUS) 100 UNIT/ML injection Inject 0.05 mLs (5 Units total) into the skin at bedtime. (Patient taking differently: Inject 10 Units into the skin at bedtime. )   . insulin lispro (HUMALOG KWIKPEN) 100 UNIT/ML KiwkPen Inject into the skin. 12 units in the am, 10 units at lunch and 12 units in the pm   . losartan-hydrochlorothiazide (HYZAAR) 100-25 MG tablet Take 1 tablet by mouth daily.   . Magnesium 400 MG CAPS Take by mouth daily.   . montelukast (SINGULAIR) 10 MG tablet TAKE ONE TABLET BY MOUTH AT BEDTIME   . Multiple Vitamins-Minerals (ICAPS AREDS 2 PO) Take by mouth.   . naproxen sodium (ANAPROX) 220 MG tablet Take 220 mg by mouth 2 (two) times daily as needed.    . pantoprazole (PROTONIX) 40 MG tablet TAKE ONE TABLET BY MOUTH EVERY DAY   . Potassium 99 MG TABS Take by mouth.   . potassium chloride (K-DUR) 10 MEQ tablet Take 1 tablet (10 mEq total) by mouth 2 (two) times daily.   . SURE COMFORT PEN NEEDLES 31G X 5 MM MISC    . traZODone (DESYREL) 50 MG tablet Take 50 mg by mouth at bedtime.    . vitamin E 400 UNIT capsule Take 1,000 Units by mouth daily.   Marland Kitchen azithromycin (ZITHROMAX) 250 MG tablet Take 2 pills on day 1, and then 1 pill on each of the following four days.   . benzonatate (TESSALON) 100 MG capsule TAKE 1 CAPSULE 3 TIMES DAILY AS NEEDED FOR COUGH   . [DISCONTINUED] FLUoxetine (PROZAC) 40 MG capsule  09/28/2016: Received from: External Pharmacy   No facility-administered encounter medications on file as of 02/03/2017.     Allergies  Allergen Reactions  . Neomycin-Bacitracin Zn-Polymyx Swelling  . Benzalkonium Chloride Itching and Swelling  . Ibuprofen     Other reaction(s): Dizziness Heart fluttering. Tachycardia. Tachycardia.  . Valdecoxib Nausea And Vomiting  . Albuterol Rash  . Latex  Rash  . Tape Rash    blisters  . Triamcinolone Rash       Physical Exam  Constitutional: She is oriented to person, place, and time. She appears well-developed and well-nourished. She appears ill. No distress.  HENT:  Right Ear: Tympanic membrane and external ear normal.  Left Ear: Tympanic membrane and external ear normal.  Mouth/Throat: Oropharynx is clear and moist. No oropharyngeal exudate, posterior oropharyngeal edema or posterior oropharyngeal erythema.  Cardiovascular: Normal rate and regular rhythm.   Pulmonary/Chest: Effort normal and breath sounds normal.  Lymphadenopathy:    She has cervical adenopathy.  Neurological: She is alert and oriented to person, place, and time.  Skin: Skin is warm.  Psychiatric: She has a normal mood and affect. Her behavior is normal.       Assessment & Plan:  1. Cough  O2 hovering around 92-93% and her normal is 95%. Not in respiratory distress but with slightly  elevated temperature. Flu in office negative. Will evaluate with CXR.  - DG Chest 2 View; Future - POCT Influenza A/B  2. Elevated temperature  - DG Chest 2 View; Future - POCT Influenza A/B  3. Pharyngitis, unspecified etiology  Z-pack unless otherwise notified based on CXR.   - azithromycin (ZITHROMAX) 250 MG tablet; Take 2 pills on day 1, and then 1 pill on each of the following four days.  Dispense: 6 tablet; Refill: 0 - POCT Influenza A/B  4. Type 2 diabetes mellitus with hypoglycemia without coma, without long-term current use of insulin (HCC)  POCT glucose 117. Patient does have sick plan with Dr. Gabriel Carina 2/2 her insulin use and knows to call her for adjustments.  - POCT Glucose (CBG)  5. COPD  Stable. Does not appear to be exacerbation.  Patient Instructions  Please go get your chest x-ray at the outpatient imaging center across the street on [address]. You don't need any papers for this, you just walk in.  Pharyngitis Pharyngitis is redness, pain,  and swelling (inflammation) of your pharynx. What are the causes? Pharyngitis is usually caused by infection. Most of the time, these infections are from viruses (viral) and are part of a cold. However, sometimes pharyngitis is caused by bacteria (bacterial). Pharyngitis can also be caused by allergies. Viral pharyngitis may be spread from person to person by coughing, sneezing, and personal items or utensils (cups, forks, spoons, toothbrushes). Bacterial pharyngitis may be spread from person to person by more intimate contact, such as kissing. What are the signs or symptoms? Symptoms of pharyngitis include:  Sore throat.  Tiredness (fatigue).  Low-grade fever.  Headache.  Joint pain and muscle aches.  Skin rashes.  Swollen lymph nodes.  Plaque-like film on throat or tonsils (often seen with bacterial pharyngitis). How is this diagnosed? Your health care provider will ask you questions about your illness and your symptoms. Your medical history, along with a physical exam, is often all that is needed to diagnose pharyngitis. Sometimes, a rapid strep test is done. Other lab tests may also be done, depending on the suspected cause. How is this treated? Viral pharyngitis will usually get better in 3-4 days without the use of medicine. Bacterial pharyngitis is treated with medicines that kill germs (antibiotics). Follow these instructions at home:  Drink enough water and fluids to keep your urine clear or pale yellow.  Only take over-the-counter or prescription medicines as directed by your health care provider:  If you are prescribed antibiotics, make sure you finish them even if you start to feel better.  Do not take aspirin.  Get lots of rest.  Gargle with 8 oz of salt water ( tsp of salt per 1 qt of water) as often as every 1-2 hours to soothe your throat.  Throat lozenges (if you are not at risk for choking) or sprays may be used to soothe your throat. Contact a health care  provider if:  You have large, tender lumps in your neck.  You have a rash.  You cough up green, yellow-brown, or bloody spit. Get help right away if:  Your neck becomes stiff.  You drool or are unable to swallow liquids.  You vomit or are unable to keep medicines or liquids down.  You have severe pain that does not go away with the use of recommended medicines.  You have trouble breathing (not caused by a stuffy nose). This information is not intended to replace advice given to you by your  health care provider. Make sure you discuss any questions you have with your health care provider. Document Released: 09/13/2005 Document Revised: 02/19/2016 Document Reviewed: 05/21/2013 Elsevier Interactive Patient Education  2017 [employer].   Return if symptoms worsen or fail to improve.   The entirety of the information documented in the History of Present Illness, Review of Systems and Physical Exam were personally obtained by me. Portions of this information were initially documented by Ashley Royalty, CMA and reviewed by me for thoroughness and accuracy.

## 2017-02-03 NOTE — Patient Instructions (Signed)
Please go get your chest x-ray at the outpatient imaging center across the street on [address]. You don't need any papers for this, you just walk in.  Pharyngitis Pharyngitis is redness, pain, and swelling (inflammation) of your pharynx. What are the causes? Pharyngitis is usually caused by infection. Most of the time, these infections are from viruses (viral) and are part of a cold. However, sometimes pharyngitis is caused by bacteria (bacterial). Pharyngitis can also be caused by allergies. Viral pharyngitis may be spread from person to person by coughing, sneezing, and personal items or utensils (cups, forks, spoons, toothbrushes). Bacterial pharyngitis may be spread from person to person by more intimate contact, such as kissing. What are the signs or symptoms? Symptoms of pharyngitis include:  Sore throat.  Tiredness (fatigue).  Low-grade fever.  Headache.  Joint pain and muscle aches.  Skin rashes.  Swollen lymph nodes.  Plaque-like film on throat or tonsils (often seen with bacterial pharyngitis). How is this diagnosed? Your health care provider will ask you questions about your illness and your symptoms. Your medical history, along with a physical exam, is often all that is needed to diagnose pharyngitis. Sometimes, a rapid strep test is done. Other lab tests may also be done, depending on the suspected cause. How is this treated? Viral pharyngitis will usually get better in 3-4 days without the use of medicine. Bacterial pharyngitis is treated with medicines that kill germs (antibiotics). Follow these instructions at home:  Drink enough water and fluids to keep your urine clear or pale yellow.  Only take over-the-counter or prescription medicines as directed by your health care provider:  If you are prescribed antibiotics, make sure you finish them even if you start to feel better.  Do not take aspirin.  Get lots of rest.  Gargle with 8 oz of salt water ( tsp  of salt per 1 qt of water) as often as every 1-2 hours to soothe your throat.  Throat lozenges (if you are not at risk for choking) or sprays may be used to soothe your throat. Contact a health care provider if:  You have large, tender lumps in your neck.  You have a rash.  You cough up green, yellow-brown, or bloody spit. Get help right away if:  Your neck becomes stiff.  You drool or are unable to swallow liquids.  You vomit or are unable to keep medicines or liquids down.  You have severe pain that does not go away with the use of recommended medicines.  You have trouble breathing (not caused by a stuffy nose). This information is not intended to replace advice given to you by your health care provider. Make sure you discuss any questions you have with your health care provider. Document Released: 09/13/2005 Document Revised: 02/19/2016 Document Reviewed: 05/21/2013 Elsevier Interactive Patient Education  2017 [employer].

## 2017-02-04 ENCOUNTER — Other Ambulatory Visit: Payer: Self-pay | Admitting: Physician Assistant

## 2017-02-04 DIAGNOSIS — J029 Acute pharyngitis, unspecified: Secondary | ICD-10-CM

## 2017-02-04 MED ORDER — AMOXICILLIN 500 MG PO CAPS: 500.0000 mg | ORAL_CAPSULE | Freq: Two times a day (BID) | ORAL | 0 refills | Status: AC

## 2017-02-04 NOTE — Progress Notes (Signed)
Pt advised-aa 

## 2017-02-04 NOTE — Progress Notes (Unsigned)
Sent in amoxicillin 500 mg BID for five days. Thanks.

## 2017-02-09 ENCOUNTER — Telehealth: Payer: Self-pay | Admitting: Family Medicine

## 2017-02-09 DIAGNOSIS — N898 Other specified noninflammatory disorders of vagina: Secondary | ICD-10-CM

## 2017-02-09 DIAGNOSIS — R1310 Dysphagia, unspecified: Secondary | ICD-10-CM

## 2017-02-09 NOTE — Telephone Encounter (Signed)
ok 

## 2017-02-09 NOTE — Telephone Encounter (Signed)
Pt states she is having trouble swallowing, states when she eats and talks she starts to cough.  States she feels like there is a knot in her throat.  Pt states she has seen Dr. Rosanna Randy for this before but would like to have a referral to GI, Dr. Vira Agar.

## 2017-02-09 NOTE — Telephone Encounter (Signed)
Ok to refer? If so, what would be the diagnosis? Dysphagia? Please advise. Thanks!

## 2017-02-09 NOTE — Telephone Encounter (Signed)
Referral ordered and pt advised. Renaldo Fiddler, CMA

## 2017-02-09 NOTE — Telephone Encounter (Signed)
Pt states she was seen last Thursday and rec'd an antibiotic.  Pt she now has itching in her vaginal area that started 2 day ago.  [facility].  CB#[phone]/MW

## 2017-02-10 ENCOUNTER — Other Ambulatory Visit: Payer: Self-pay | Admitting: Family Medicine

## 2017-02-10 DIAGNOSIS — J3089 Other allergic rhinitis: Secondary | ICD-10-CM

## 2017-02-10 MED ORDER — TERCONAZOLE 0.4 % VA CREA
TOPICAL_CREAM | VAGINAL | 0 refills | Status: DC
Start: 2017-02-10 — End: 2017-06-07

## 2017-02-10 NOTE — Telephone Encounter (Signed)
[place] patient saw you on 02/03/17 can you address this or do you want Dr Rosanna Randy to look at this?-aa

## 2017-02-10 NOTE — Telephone Encounter (Signed)
Pt advised as below. Renaldo Fiddler, CMA

## 2017-02-10 NOTE — Telephone Encounter (Signed)
Pt has called back regarding rx for yeast.  Please advise.  Con Memos

## 2017-02-10 NOTE — Telephone Encounter (Signed)
Sent in terconazole cream. She can apply thin layer to external vaginal area and if she feels irritation is internal, then use one applicator full inserted vaginally before bed for one week.

## 2017-02-14 ENCOUNTER — Other Ambulatory Visit: Payer: Self-pay | Admitting: Nurse Practitioner

## 2017-02-14 DIAGNOSIS — K219 Gastro-esophageal reflux disease without esophagitis: Secondary | ICD-10-CM | POA: Diagnosis not present

## 2017-02-14 DIAGNOSIS — R221 Localized swelling, mass and lump, neck: Secondary | ICD-10-CM

## 2017-02-14 DIAGNOSIS — R05 Cough: Secondary | ICD-10-CM

## 2017-02-14 DIAGNOSIS — R059 Cough, unspecified: Secondary | ICD-10-CM

## 2017-02-15 ENCOUNTER — Ambulatory Visit (INDEPENDENT_AMBULATORY_CARE_PROVIDER_SITE_OTHER): Payer: Medicare Other | Admitting: Family Medicine

## 2017-02-15 ENCOUNTER — Telehealth: Payer: Self-pay

## 2017-02-15 ENCOUNTER — Encounter: Payer: Self-pay | Admitting: Family Medicine

## 2017-02-15 VITALS — BP 130/62 | HR 78 | Temp 97.9°F | Resp 16 | Wt 149.0 lb

## 2017-02-15 DIAGNOSIS — G4700 Insomnia, unspecified: Secondary | ICD-10-CM

## 2017-02-15 DIAGNOSIS — R05 Cough: Secondary | ICD-10-CM

## 2017-02-15 DIAGNOSIS — R059 Cough, unspecified: Secondary | ICD-10-CM

## 2017-02-15 DIAGNOSIS — I10 Essential (primary) hypertension: Secondary | ICD-10-CM

## 2017-02-15 NOTE — Patient Instructions (Signed)
Stop taking Losartan (blood pressure medicine) for now, as this could be the cause of your cough. Stay off of the alprazolam. We referred to [facility] pulmonology, a lung specialist. They will be in touch with you to schedule an appointment. See [place] back in a month so we can recheck your cough and monitor your blood pressure.

## 2017-02-15 NOTE — Progress Notes (Signed)
Patient: [name] Female    DOB: 09-05-1929   83 y.o.   MRN: [id number] Visit Date: 02/15/2017  Today's Provider: Wilhemena Durie, MD   Chief Complaint  Patient presents with  . Cough   Subjective:    Cough  The current episode started more than 1 month ago. The problem has been unchanged. The cough is productive of sputum (clear). Associated symptoms include chest pain ("sore"), nasal congestion, postnasal drip and rhinorrhea. Pertinent negatives include no chills, ear congestion, ear pain, fever, headaches, heartburn (controlled with protonix), hemoptysis, sore throat, shortness of breath, sweats, weight loss or wheezing. Treatments tried: Zyrtec, Singulair, Tessalon Perels. The treatment provided no relief. Her past medical history is significant for COPD.  Last spirometry was 07/07/2016.  Pt saw Denice Paradise, NP at [facility] yesterday for dysphagia. Joelene Millin advised pt to complete a barium swallow, that will be performed on Thursday. Joelene Millin also referred pt to ENT, and advised pt to FU with PCP for possible referral to pulmonology.    Allergies  Allergen Reactions  . Neomycin-Bacitracin Zn-Polymyx Swelling  . Benzalkonium Chloride Itching and Swelling  . Ibuprofen     Other reaction(s): Dizziness Heart fluttering. Tachycardia. Tachycardia.  . Valdecoxib Nausea And Vomiting  . Albuterol Rash  . Latex Rash  . Tape Rash    blisters  . Triamcinolone Rash     Current Outpatient Prescriptions:  .  acetaminophen (TYLENOL) 500 MG tablet, Take 1,000 mg by mouth every 6 (six) hours as needed., Disp: , Rfl:  .  Ascorbic Acid (VITAMIN C) 1000 MG tablet, Take 1,000 mg by mouth daily. , Disp: , Rfl:  .  aspirin 325 MG tablet, Take 325 mg by mouth daily. Reported on 01/05/2016, Disp: , Rfl:  .  ASSURE COMFORT LANCETS 30G MISC, , Disp: , Rfl:  .  benzonatate (TESSALON) 100 MG capsule, TAKE 1 CAPSULE 3 TIMES DAILY AS NEEDED FOR COUGH, Disp: 90 capsule, Rfl: 5 .   Blood Glucose Calibration (ACCU-CHEK SMARTVIEW CONTROL) LIQD, , Disp: , Rfl:  .  cetirizine (ZYRTEC) 10 MG tablet, TAKE 1 TABLET BY MOUTH DAILY, Disp: 90 tablet, Rfl: 3 .  cholecalciferol (VITAMIN D) 1000 units tablet, Take 1,000 Units by mouth daily., Disp: , Rfl:  .  diltiazem (CARDIZEM CD) 180 MG 24 hr capsule, TAKE 1 CAPSULE BY MOUTH EVERY DAY, Disp: 90 capsule, Rfl: 3 .  donepezil (ARICEPT) 5 MG tablet, Take 1 tablet by mouth daily., Disp: , Rfl:  .  FLUoxetine (PROZAC) 40 MG capsule, TAKE 1 CAPSULE BY MOUTH DAILY, Disp: 30 capsule, Rfl: 5 .  glucose blood test strip, ACCU-CHEK AVIVA PLUS TEST STRP Use to check sugars 3 times daily., Disp: , Rfl:  .  insulin glargine (LANTUS) 100 UNIT/ML injection, Inject 0.05 mLs (5 Units total) into the skin at bedtime. (Patient taking differently: Inject 10 Units into the skin at bedtime. ), Disp: 10 mL, Rfl: 11 .  insulin lispro (HUMALOG KWIKPEN) 100 UNIT/ML KiwkPen, Inject into the skin. 12 units in the am, 10 units at lunch and 12 units in the pm, Disp: , Rfl:  .  losartan-hydrochlorothiazide (HYZAAR) 100-25 MG tablet, Take 1 tablet by mouth daily., Disp: 90 tablet, Rfl: 3 .  Magnesium 400 MG CAPS, Take by mouth daily., Disp: , Rfl:  .  montelukast (SINGULAIR) 10 MG tablet, TAKE ONE TABLET BY MOUTH AT BEDTIME, Disp: 90 tablet, Rfl: 3 .  Multiple Vitamins-Minerals (ICAPS AREDS 2 PO), Take by  mouth., Disp: , Rfl:  .  naproxen sodium (ANAPROX) 220 MG tablet, Take 220 mg by mouth 2 (two) times daily as needed. , Disp: , Rfl:  .  pantoprazole (PROTONIX) 40 MG tablet, TAKE ONE TABLET BY MOUTH EVERY DAY, Disp: 90 tablet, Rfl: 3 .  potassium chloride (K-DUR) 10 MEQ tablet, Take 1 tablet (10 mEq total) by mouth 2 (two) times daily., Disp: 180 tablet, Rfl: 3 .  SURE COMFORT PEN NEEDLES 31G X 5 MM MISC, , Disp: , Rfl:  .  terconazole (TERAZOL 7) 0.4 % vaginal cream, Can apply thin layer to external vaginal nightly. If needed, can use one applicator inserted  vaginally before bed for 7 days., Disp: 45 g, Rfl: 0 .  traZODone (DESYREL) 50 MG tablet, Take 50 mg by mouth at bedtime. , Disp: , Rfl:  .  ALPRAZolam (XANAX) 0.25 MG tablet, Take 0.5-1 tablets (0.125-0.25 mg total) by mouth at bedtime. (Patient not taking: Reported on 02/15/2017), Disp: 30 tablet, Rfl: 5  Review of Systems  Constitutional: Negative for chills, fever and weight loss.  HENT: Positive for postnasal drip and rhinorrhea. Negative for ear pain and sore throat.   Eyes: Negative.   Respiratory: Positive for cough. Negative for hemoptysis, shortness of breath and wheezing.   Cardiovascular: Positive for chest pain ("sore").  Gastrointestinal: Negative.  Negative for heartburn (controlled with protonix).  Endocrine: Negative.   Neurological: Negative.  Negative for headaches.  Psychiatric/Behavioral: Negative.     Social History  Substance Use Topics  . Smoking status: Never Smoker  . Smokeless tobacco: Never Used  . Alcohol use No   Objective:   BP 130/62 (BP Location: Right Arm, Patient Position: Sitting, Cuff Size: Normal)   Pulse 78   Temp 97.9 F (36.6 C) (Oral)   Resp 16   Wt 149 lb (67.6 kg)   SpO2 95%   BMI 25.58 kg/m  Vitals:   02/15/17 0940  BP: 130/62  Pulse: 78  Resp: 16  Temp: 97.9 F (36.6 C)  TempSrc: Oral  SpO2: 95%  Weight: 149 lb (67.6 kg)     Physical Exam  Constitutional: She is oriented to person, place, and time. She appears well-developed and well-nourished.  HENT:  Head: Normocephalic and atraumatic.  Right Ear: External ear normal.  Left Ear: External ear normal.  Nose: Nose normal.  Mouth/Throat: Oropharynx is clear and moist.  Eyes: Conjunctivae are normal. No scleral icterus.  Neck: Normal range of motion. No thyromegaly present.  Cardiovascular: Normal rate, regular rhythm and normal heart sounds.   Pulmonary/Chest: Effort normal and breath sounds normal. No respiratory distress. She has no wheezes.  Abdominal: Soft. There  is no tenderness.  Neurological: She is alert and oriented to person, place, and time.  Skin: Skin is warm and dry.  Psychiatric: She has a normal mood and affect. Her behavior is normal. Judgment and thought content normal.        Assessment & Plan:     1. Cough Not to goal. Pt recently had CXR and spirometry. Will refer to pulmonology for further evaluation. Also will D/C Losartan-HCTZ 100-25 mg, because this may be the cause of the cough. FU in 1 month. - Ambulatory referral to Pulmonology  2. Benign essential HTN D/C Losartan-HCTZ as above. FU 1 month.  3. Insomnia, unspecified type Continue Trazodone, but stay off of the Alprazolam.  4.MCI/Early Dementia Worsening problem for this pt.      I have done the exam and reviewed the  above chart and it is accurate to the best of my knowledge. Development worker, community has been used in this note in any air is in the dictation or transcription are unintentional.  Wilhemena Durie, MD  [place]

## 2017-02-15 NOTE — Telephone Encounter (Signed)
Called [facility] to advise them to D/C pt's Losartan-HCTZ, as well as her alprazolam. Pt reports the pharmacy fills her pill box, and they needed to be informed of the med change. Renaldo Fiddler, CMA

## 2017-02-17 ENCOUNTER — Ambulatory Visit
Admission: RE | Admit: 2017-02-17 | Discharge: 2017-02-17 | Disposition: A | Discharge status: Home / Self Care | Payer: Medicare Other | Source: Ambulatory Visit | Attending: Nurse Practitioner | Admitting: Nurse Practitioner

## 2017-02-17 DIAGNOSIS — R05 Cough: Secondary | ICD-10-CM | POA: Insufficient documentation

## 2017-02-17 DIAGNOSIS — R221 Localized swelling, mass and lump, neck: Secondary | ICD-10-CM | POA: Diagnosis not present

## 2017-02-17 DIAGNOSIS — K219 Gastro-esophageal reflux disease without esophagitis: Secondary | ICD-10-CM | POA: Diagnosis not present

## 2017-02-17 DIAGNOSIS — R059 Cough, unspecified: Secondary | ICD-10-CM

## 2017-03-03 ENCOUNTER — Encounter: Payer: Self-pay | Admitting: Internal Medicine

## 2017-03-03 ENCOUNTER — Ambulatory Visit (INDEPENDENT_AMBULATORY_CARE_PROVIDER_SITE_OTHER): Payer: Medicare Other | Admitting: Internal Medicine

## 2017-03-03 VITALS — BP 148/78 | HR 75 | Resp 16 | Ht 64.0 in | Wt 146.0 lb

## 2017-03-03 DIAGNOSIS — R05 Cough: Secondary | ICD-10-CM | POA: Diagnosis not present

## 2017-03-03 DIAGNOSIS — R059 Cough, unspecified: Secondary | ICD-10-CM

## 2017-03-03 MED ORDER — FLUTICASONE PROPIONATE HFA 220 MCG/ACT IN AERO: 2.0000 | INHALATION_SPRAY | Freq: Two times a day (BID) | RESPIRATORY_TRACT | 2 refills | Status: DC

## 2017-03-03 MED ORDER — PREDNISONE 20 MG PO TABS: 20.0000 mg | ORAL_TABLET | Freq: Every day | ORAL | 0 refills | Status: DC

## 2017-03-03 NOTE — Progress Notes (Signed)
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); Cervicalgia; Chronic back pain; Chronic obstructive pulmonary disease (COPD) (HCC); COPD (chronic obstructive pulmonary disease) (HCC); DDD (degenerative disc disease), lumbar; Diabetes mellitus without complication ([place]); Dysphagia; Hyperlipemia; Hypertension; and Vulvovaginitis.  has a past surgical history that includes Vaginal hysterectomy; melanoma; gall bladder; ureterolithiasis; Vulva / perineum biopsy (05/29/2015); Cardiac catheterization (Left, 01/05/2016); and Cardiac catheterization (01/05/2016). Prior to Admission medications   Medication Sig Start Date End Date Taking? Authorizing Provider  acetaminophen (TYLENOL) 500 MG tablet Take 1,000 mg by mouth every 6 (six) hours as needed.   Yes [provider]  Ascorbic Acid (VITAMIN C) 1000 MG tablet Take 1,000 mg by mouth daily.    Yes [provider]  aspirin 325 MG tablet Take 325 mg by mouth daily. Reported  on 01/05/2016   Yes [provider]  [place]  08/01/15  Yes [provider]  Blood Glucose Calibration (ACCU-CHEK SMARTVIEW CONTROL) LIQD  07/30/15  Yes [provider]  cetirizine (ZYRTEC) 10 MG tablet TAKE 1 TABLET BY MOUTH DAILY 02/10/17  Yes Jerrol Banana., MD  cholecalciferol (VITAMIN D) 1000 units tablet Take 1,000 Units by mouth daily.   Yes [provider]  diltiazem (CARDIZEM CD) 180 MG 24 hr capsule TAKE 1 CAPSULE BY MOUTH EVERY DAY 01/12/17  Yes Jerrol Banana., MD  donepezil (ARICEPT) 5 MG tablet Take 1 tablet by mouth daily. 02/10/17  Yes [provider]  FLUoxetine (PROZAC) 40 MG capsule TAKE 1 CAPSULE BY MOUTH DAILY 12/15/16  Yes Jerrol Banana., MD  glucose blood test strip ACCU-CHEK AVIVA PLUS TEST STRP Use to check sugars 3 times daily. 10/28/15  Yes [provider]  insulin glargine (LANTUS) 100 UNIT/ML injection Inject 0.05 mLs (5 Units total) into the skin at bedtime. Patient taking differently: Inject 10 Units into the skin at bedtime.  07/02/16  Yes Epifanio Lesches, MD  insulin lispro (HUMALOG KWIKPEN) 100 UNIT/ML KiwkPen Inject into the skin. 12 units in the am, 10 units at lunch and 12 units in the pm   Yes [provider]  Magnesium 400 MG CAPS Take by mouth daily.   Yes [provider]  montelukast (SINGULAIR) 10 MG tablet TAKE ONE TABLET BY MOUTH AT BEDTIME 08/25/16  Yes Jerrol Banana., MD  Multiple Vitamins-Minerals (ICAPS AREDS 2 PO) Take by mouth.   Yes [provider]  naproxen sodium (ANAPROX) 220 MG tablet Take 220 mg by mouth  2 (two) times daily as needed.    Yes [provider]  pantoprazole (PROTONIX) 40 MG tablet TAKE ONE TABLET BY MOUTH EVERY DAY 08/25/16  Yes Jerrol Banana., MD  potassium chloride (K-DUR) 10 MEQ tablet Take 1 tablet (10 mEq total) by mouth 2 (two) times daily. 07/07/16  Yes Jerrol Banana.,  MD  SURE COMFORT PEN NEEDLES 31G X 5 MM MISC  07/30/15  Yes [provider]  terconazole (TERAZOL 7) 0.4 % vaginal cream Can apply thin layer to external vaginal nightly. If needed, can use one applicator inserted vaginally before bed for 7 days. 02/10/17  Yes Trinna Post, PA-C  traZODone (DESYREL) 50 MG tablet Take 50 mg by mouth at bedtime.    Yes [provider]   Allergies  Allergen Reactions  . Neomycin-Bacitracin Zn-Polymyx Swelling  . Benzalkonium Chloride Itching and Swelling  . Ibuprofen     Other reaction(s): Dizziness Heart fluttering. Tachycardia. Tachycardia.  . Valdecoxib Nausea And Vomiting  . Albuterol Rash  . Latex Rash  . Tape Rash    blisters  . Triamcinolone Rash    FAMILY HISTORY:  family history includes Colon cancer in her brother; Diabetes in her brother and sister; Ovarian cancer in her other. SOCIAL HISTORY:  reports that she has never smoked. She has never used smokeless tobacco. She reports that she does not drink alcohol or use drugs.  REVIEW OF SYSTEMS:   Constitutional: Negative for fever, chills, weight loss, malaise/fatigue and diaphoresis.  HENT: Negative for hearing loss, ear pain, nosebleeds, congestion, sore throat, neck pain, tinnitus and ear discharge.   Eyes: Negative for blurred vision, double vision, photophobia, pain, discharge and redness.  Respiratory: +cough, hemoptysis, +sputum production, shortness of breath, wheezing and stridor.   Cardiovascular: Negative for chest pain, palpitations, orthopnea, claudication, leg swelling and PND.  Gastrointestinal: Negative for heartburn, nausea, vomiting, abdominal pain, diarrhea, constipation, blood in stool and melena.  Genitourinary: Negative for dysuria, urgency, frequency, hematuria and flank pain.  Musculoskeletal: Negative for myalgias, back pain, joint pain and falls.  Skin: Negative for itching and rash.  Neurological: Negative for dizziness, tingling, tremors,  sensory change, speech change, focal weakness, seizures, loss of consciousness, weakness and headaches.  Endo/Heme/Allergies: Negative for environmental allergies and polydipsia. Does not bruise/bleed easily.  ALL OTHER ROS ARE NEGATIVE  BP (!) 148/78 (BP Location: Left Arm, Patient Position: Sitting, Cuff Size: Normal)   Pulse 75   Resp 16   Ht 5\' 4"  (1.626 m)   Wt 146 lb (66.2 kg)   SpO2 94%   BMI 25.06 kg/m   Physical Examination:   GENERAL:NAD, no fevers, chills, no weakness no fatigue HEAD: Normocephalic, atraumatic.  EYES: Pupils equal, round, reactive to light. Extraocular muscles intact. No scleral icterus.  MOUTH: Moist mucosal membrane.   EAR, NOSE, THROAT: Clear without exudates. No external lesions.  NECK: Supple. No thyromegaly. No nodules. No JVD.  PULMONARY:CTA B/L no wheezes, no crackles, no rhonchi CARDIOVASCULAR: S1 and S2. Regular rate and rhythm. No murmurs, rubs, or gallops. No edema.  GASTROINTESTINAL: Soft, nontender, nondistended. No masses. Positive bowel sounds.  MUSCULOSKELETAL: No swelling, clubbing, or edema. Range of motion full in all extremities.  NEUROLOGIC: Cranial nerves II through XII are intact. No gross focal neurological deficits.  SKIN: No ulceration, lesions, rashes, or cyanosis. Skin warm and dry. Turgor intact.  PSYCHIATRIC: Mood, affect within normal limits. The patient is awake, alert and oriented x 3. Insight, judgment intact.  CT chest Oct 2017 I have Independently reviewed images of  CT scan  on 03/03/2017 Interpretation:no PE, no effusions, no opacities noted, NL CT scan There is come evidence of bronchiectasis in lower lobes    ASSESSMENT / PLAN: 83 yo white female with chronic cough with chronic sputum production with obstructive pattern on flow volume loops this is suggestive of mild COPD with chronic bronchitis in the setting of lower lobe bronchiectasis  1.chronic cough and chronic sputum production-c/w chronic  bronchitis/COPD -will need to avoid second hand smoke exposure  2.MILD COPD with chronic Bronchitis -will start prednisone 20 mg daily for 7 days -will start Flovent 220 BID  3.Bronchiectasis -will recommend flutter valve and incentive spirometry   Patient/Family are satisfied with Plan of action and management. All questions answered Follow up in 1-2 months for assessment  Corrin Parker, M.D.  [facility]  Medical Director [place] Director [facility]

## 2017-03-03 NOTE — Patient Instructions (Addendum)
Follow up in 1-2 months Start prednisone 20 mg daily for 7 days Start Flovent 220 twice daily-please rinse mouth out after every use

## 2017-03-04 ENCOUNTER — Encounter (INDEPENDENT_AMBULATORY_CARE_PROVIDER_SITE_OTHER): Payer: Medicare Other

## 2017-03-04 ENCOUNTER — Ambulatory Visit (INDEPENDENT_AMBULATORY_CARE_PROVIDER_SITE_OTHER): Payer: Medicare Other | Admitting: Vascular Surgery

## 2017-03-10 ENCOUNTER — Other Ambulatory Visit: Payer: Self-pay | Admitting: Family Medicine

## 2017-03-10 DIAGNOSIS — E876 Hypokalemia: Secondary | ICD-10-CM

## 2017-03-14 DIAGNOSIS — K219 Gastro-esophageal reflux disease without esophagitis: Secondary | ICD-10-CM | POA: Diagnosis not present

## 2017-03-14 DIAGNOSIS — H6123 Impacted cerumen, bilateral: Secondary | ICD-10-CM | POA: Diagnosis not present

## 2017-03-14 DIAGNOSIS — R1314 Dysphagia, pharyngoesophageal phase: Secondary | ICD-10-CM | POA: Diagnosis not present

## 2017-03-21 ENCOUNTER — Ambulatory Visit: Payer: Medicare Other | Admitting: Family Medicine

## 2017-04-06 ENCOUNTER — Ambulatory Visit (INDEPENDENT_AMBULATORY_CARE_PROVIDER_SITE_OTHER): Payer: Medicare Other | Admitting: Family Medicine

## 2017-04-06 ENCOUNTER — Encounter: Payer: Self-pay | Admitting: Family Medicine

## 2017-04-06 VITALS — BP 158/60 | HR 81 | Temp 98.3°F | Resp 16 | Wt 149.0 lb

## 2017-04-06 DIAGNOSIS — R059 Cough, unspecified: Secondary | ICD-10-CM

## 2017-04-06 DIAGNOSIS — R05 Cough: Secondary | ICD-10-CM

## 2017-04-06 DIAGNOSIS — I10 Essential (primary) hypertension: Secondary | ICD-10-CM

## 2017-04-06 MED ORDER — DILTIAZEM HCL ER COATED BEADS 180 MG PO CP24: 360.0000 mg | ORAL_CAPSULE | Freq: Every day | ORAL | 3 refills | Status: DC

## 2017-04-06 NOTE — Progress Notes (Signed)
Patient: [name] Female    DOB: June 23, 1929   83 y.o.   MRN: [id number] Visit Date: 04/06/2017  Today's Provider: Wilhemena Durie, MD   Chief Complaint  Patient presents with  . Hypertension  . Cough   Subjective:    HPI      Hypertension, follow-up:  BP Readings from Last 3 Encounters:  04/06/17 (!) 158/60  03/03/17 (!) 148/78  02/15/17 130/62    She was last seen for hypertension 6 weeks ago.  BP at that visit was 130/62. Management since that visit includes D/C Losartan-HCTZ secondary to cough. She reports good compliance with treatment. She is not having side effects.  She is seldom exercising. She is adherent to low salt diet.   Outside blood pressures are not being checked due to dead battery in machine. She is experiencing dyspnea and fatigue.  Patient denies chest pain, chest pressure/discomfort, claudication, exertional chest pressure/discomfort, irregular heart beat, lower extremity edema, near-syncope, orthopnea, palpitations and syncope.   Cardiovascular risk factors include advanced age (older than 71 for men, 20 for women), diabetes mellitus, dyslipidemia and hypertension.    Weight trend: stable Wt Readings from Last 3 Encounters:  04/06/17 149 lb (67.6 kg)  03/03/17 146 lb (66.2 kg)  02/15/17 149 lb (67.6 kg)    Current diet: in general, a "healthy" diet    ------------------------------------------------------------------------  Follow up for Cough  The patient was last seen for this 6 weeks ago. Changes made at last visit include D/C Losartan-HCTZ and referring to pulmonology.  She reports good compliance with treatment. She saw Dr. Mortimer Fries on 03/03/2017, who started pt on Prednisone 20 mg po qd x 7 days and Flovent 220 BID. She feels that condition is Unchanged. She states her cough is worse at night. She is not having side effects.    ------------------------------------------------------------------------------------    Allergies  Allergen Reactions  . Neomycin-Bacitracin Zn-Polymyx Swelling  . Benzalkonium Chloride Itching and Swelling  . Ibuprofen     Other reaction(s): Dizziness Heart fluttering. Tachycardia. Tachycardia.  . Valdecoxib Nausea And Vomiting  . Albuterol Rash  . Latex Rash  . Tape Rash    blisters  . Triamcinolone Rash     Current Outpatient Prescriptions:  .  Ascorbic Acid (VITAMIN C) 1000 MG tablet, Take 1,000 mg by mouth daily. , Disp: , Rfl:  .  aspirin 325 MG tablet, Take 325 mg by mouth daily. Reported on 01/05/2016, Disp: , Rfl:  .  ASSURE COMFORT LANCETS 30G MISC, , Disp: , Rfl:  .  Blood Glucose Calibration (ACCU-CHEK SMARTVIEW CONTROL) LIQD, , Disp: , Rfl:  .  cetirizine (ZYRTEC) 10 MG tablet, TAKE 1 TABLET BY MOUTH DAILY, Disp: 90 tablet, Rfl: 3 .  cholecalciferol (VITAMIN D) 1000 units tablet, Take 1,000 Units by mouth daily., Disp: , Rfl:  .  diltiazem (CARDIZEM CD) 180 MG 24 hr capsule, Take 2 capsules (360 mg total) by mouth daily., Disp: 180 capsule, Rfl: 3 .  donepezil (ARICEPT) 5 MG tablet, Take 1 tablet by mouth daily., Disp: , Rfl:  .  FLUoxetine (PROZAC) 40 MG capsule, TAKE 1 CAPSULE BY MOUTH DAILY, Disp: 30 capsule, Rfl: 5 .  fluticasone (FLOVENT HFA) 220 MCG/ACT inhaler, Inhale 2 puffs into the lungs 2 (two) times daily., Disp: 1 Inhaler, Rfl: 2 .  glucose blood test strip, ACCU-CHEK AVIVA PLUS TEST STRP Use to check sugars 3 times daily., Disp: , Rfl:  .  insulin glargine (LANTUS) 100 UNIT/ML  injection, Inject 0.05 mLs (5 Units total) into the skin at bedtime. (Patient taking differently: Inject 10 Units into the skin at bedtime. ), Disp: 10 mL, Rfl: 11 .  insulin lispro (HUMALOG KWIKPEN) 100 UNIT/ML KiwkPen, Inject into the skin. 12 units in the am, 10 units at lunch and 12 units in the pm, Disp: , Rfl:  .  Magnesium 400 MG CAPS, Take by mouth daily., Disp: , Rfl:   .  montelukast (SINGULAIR) 10 MG tablet, TAKE ONE TABLET BY MOUTH AT BEDTIME, Disp: 90 tablet, Rfl: 3 .  Multiple Vitamins-Minerals (ICAPS AREDS 2 PO), Take by mouth., Disp: , Rfl:  .  naproxen sodium (ANAPROX) 220 MG tablet, Take 220 mg by mouth 2 (two) times daily as needed. , Disp: , Rfl:  .  pantoprazole (PROTONIX) 40 MG tablet, TAKE ONE TABLET BY MOUTH EVERY DAY, Disp: 90 tablet, Rfl: 3 .  potassium chloride (K-DUR) 10 MEQ tablet, TAKE ONE TABLET BY MOUTH TWICE DAILY, Disp: 180 tablet, Rfl: 3 .  SURE COMFORT PEN NEEDLES 31G X 5 MM MISC, , Disp: , Rfl:  .  terconazole (TERAZOL 7) 0.4 % vaginal cream, Can apply thin layer to external vaginal nightly. If needed, can use one applicator inserted vaginally before bed for 7 days., Disp: 45 g, Rfl: 0 .  traZODone (DESYREL) 50 MG tablet, Take 50 mg by mouth at bedtime. , Disp: , Rfl:  .  acetaminophen (TYLENOL) 500 MG tablet, Take 1,000 mg by mouth every 6 (six) hours as needed., Disp: , Rfl:   Review of Systems  Constitutional: Positive for fatigue. Negative for activity change, appetite change, chills, diaphoresis, fever and unexpected weight change.  HENT: Negative.   Eyes: Negative.   Respiratory: Positive for cough. Negative for shortness of breath.   Cardiovascular: Negative for chest pain, palpitations and leg swelling.  Gastrointestinal: Negative.   Endocrine: Negative.   Allergic/Immunologic: Negative.   Hematological: Negative.   Psychiatric/Behavioral: Negative.     Social History  Substance Use Topics  . Smoking status: Never Smoker  . Smokeless tobacco: Never Used  . Alcohol use No   Objective:   BP (!) 158/60 (BP Location: Right Arm, Patient Position: Sitting, Cuff Size: Normal)   Pulse 81   Temp 98.3 F (36.8 C) (Oral)   Resp 16   Wt 149 lb (67.6 kg)   BMI 25.58 kg/m  Vitals:   04/06/17 1147  BP: (!) 158/60  Pulse: 81  Resp: 16  Temp: 98.3 F (36.8 C)  TempSrc: Oral  Weight: 149 lb (67.6 kg)     Physical  Exam  Constitutional: She appears well-developed and well-nourished.  HENT:  Head: Normocephalic and atraumatic.  Right Ear: External ear normal.  Left Ear: External ear normal.  Nose: Nose normal.  Eyes: Conjunctivae are normal. No scleral icterus.  Neck: Normal range of motion. No thyromegaly present.  Cardiovascular: Normal rate, regular rhythm and normal heart sounds.   Pulmonary/Chest: Effort normal and breath sounds normal. No respiratory distress.  Abdominal: Soft.  Lymphadenopathy:    She has no cervical adenopathy.  Skin: Skin is warm and dry.  Very fair skin.  Psychiatric: She has a normal mood and affect. Her behavior is normal.        Assessment & Plan:     1. Benign essential HTN Not to goal since D/C her BP medication at LOV. Increase diltiazem to 2 tablets po qd so the medication will be easier to swallow than the larger 360 mg  capsule. FU 2 months. - diltiazem (CARDIZEM CD) 180 MG 24 hr capsule; Take 2 capsules (360 mg total) by mouth daily.  Dispense: 180 capsule; Refill: 3  2. Cough Not to goal. Continue medications prescribed by Dr. Mortimer Fries, and keep FU as scheduled with pulmonology next month. FU 2 months  3.TIIDM Per Endocrine. 4.[facility] MMSE next OV. I think this is becoming an issue.     ..    I have done the exam and reviewed the above chart and it is accurate to the best of my knowledge. Development worker, community has been used in this note in any air is in the dictation or transcription are unintentional.  Wilhemena Durie, MD  [place]

## 2017-04-06 NOTE — Patient Instructions (Addendum)
You can try Mucinex and/or Delsym for the cough. Follow up with Dr. Mortimer Fries on 05/19/2017 at 11:45 am.

## 2017-04-07 ENCOUNTER — Telehealth: Payer: Self-pay | Admitting: Family Medicine

## 2017-04-07 DIAGNOSIS — I10 Essential (primary) hypertension: Secondary | ICD-10-CM

## 2017-04-07 NOTE — Telephone Encounter (Signed)
Pt has a question about her blood pressure medication.  CB#[phone]/MW

## 2017-04-07 NOTE — Telephone Encounter (Signed)
Spoke with patient. Patient states she was seen on 04/06/17 and per that note her Diltiazem was increased to 180 mg 2 tablets daily. Patient wanted to make sure this is correct, she has not been on any b/p medications and has not been taking Diltiazem 180 mg 1 daily but it looks like from the note we thought she was taking this medication and we were just increasing the dose. Please verify which dose patient should be taking. Thank you-aa

## 2017-04-08 ENCOUNTER — Other Ambulatory Visit: Payer: Self-pay | Admitting: Family Medicine

## 2017-04-08 DIAGNOSIS — I10 Essential (primary) hypertension: Secondary | ICD-10-CM

## 2017-04-08 MED ORDER — DILTIAZEM HCL ER COATED BEADS 180 MG PO CP24: 180.0000 mg | ORAL_CAPSULE | Freq: Every day | ORAL | 3 refills | Status: DC

## 2017-04-08 MED ORDER — DILTIAZEM HCL ER COATED BEADS 180 MG PO CP24: 360.0000 mg | ORAL_CAPSULE | Freq: Every day | ORAL | 3 refills | Status: DC

## 2017-04-08 NOTE — Telephone Encounter (Signed)
Spoke with [facility] for rx clarification. They had been putting the Diltiazem in her pill box every month (they prefill her pill box) and when she brings it back there is no pills left in the box. So maybe she does not realize she has been taking it?? However pharmacist says that she described the pill to her when discussing this. They are aware you are out of the office today and have filled her boxes with just one a day for now. Please advise. Thanks.

## 2017-04-08 NOTE — Telephone Encounter (Signed)
Spoke with patient and advised her that pharmacy is fixing her pill box and they are putting Diltiazem in her box and she has been taking everything in the box. Advised patient that we are increasing Diltiazem to 180 mg 2 tablets daily as we wanted to do on her last office visit. After repeating myself a few times she understood. I spoke with April at [facility] and advised her of everything and all the updates.  Need to get MMSE on the next office visit patient has in August to follow up.-aa

## 2017-04-08 NOTE — Telephone Encounter (Signed)
Pt advised and medication updated in the chart-aa

## 2017-04-08 NOTE — Telephone Encounter (Signed)
Then take 1 daily.

## 2017-04-08 NOTE — Telephone Encounter (Signed)
2 daily then. Do we need home health to see how pt functions at home.?

## 2017-04-08 NOTE — Addendum Note (Signed)
Addended by: Arnette Norris on: 04/08/2017 11:55 AM   Modules accepted: Orders

## 2017-04-11 ENCOUNTER — Telehealth: Payer: Self-pay | Admitting: Family Medicine

## 2017-04-11 ENCOUNTER — Other Ambulatory Visit: Payer: Self-pay | Admitting: Family Medicine

## 2017-04-11 DIAGNOSIS — I10 Essential (primary) hypertension: Secondary | ICD-10-CM

## 2017-04-11 NOTE — Telephone Encounter (Signed)
Stop diltiazem but will need f/u for BP in about month.

## 2017-04-11 NOTE — Telephone Encounter (Signed)
Pt advised. appt scheduled.

## 2017-04-11 NOTE — Telephone Encounter (Signed)
Christine at [facility] called saying they need clarification on pt's blood pressure medications.  Pt seems to be confused about her medications.  Please call pharmacy at [phone]  Thanks  teri

## 2017-04-11 NOTE — Telephone Encounter (Signed)
Pt called saying she is not doing well with the new blood pressure med. She said it is hurting her stomach and she felt like she was going to pass out.  Pt's call back is [phone]  Thanks teri

## 2017-04-11 NOTE — Telephone Encounter (Signed)
Patient is referring to Diltiazem that was increased to 180 mg 2 tablets daily. Please review-aa

## 2017-04-11 NOTE — Telephone Encounter (Signed)
Family had been taking Diltiazem out of her pill box and not telling the pharmacy. So has been off of diltiazem. Her losartan was also stopped due to cough on May 22nd. pt is concerned because she will not have a BP med at all. However this would mean pt has not been taking BP medication since they started taking the medication out which is unknown. Family was apparently under the impression that she was suppose to stop both blood pressure medications. So when the diltiazem was increased to 2 daily (because she was not taking it at all) made her feel bad. I have spoke with pharmacy and told them to change diltiazem to one daily.

## 2017-04-12 MED ORDER — DILTIAZEM HCL ER COATED BEADS 180 MG PO CP24: ORAL_CAPSULE | ORAL | 3 refills | Status: DC

## 2017-05-03 DIAGNOSIS — E113293 Type 2 diabetes mellitus with mild nonproliferative diabetic retinopathy without macular edema, bilateral: Secondary | ICD-10-CM | POA: Diagnosis not present

## 2017-05-03 LAB — HM DIABETES EYE EXAM

## 2017-05-12 ENCOUNTER — Ambulatory Visit (INDEPENDENT_AMBULATORY_CARE_PROVIDER_SITE_OTHER): Payer: Medicare Other | Admitting: Family Medicine

## 2017-05-12 ENCOUNTER — Encounter: Payer: Self-pay | Admitting: Family Medicine

## 2017-05-12 VITALS — BP 162/62 | HR 72 | Temp 98.1°F | Wt 149.8 lb

## 2017-05-12 DIAGNOSIS — G3184 Mild cognitive impairment, so stated: Secondary | ICD-10-CM

## 2017-05-12 DIAGNOSIS — I10 Essential (primary) hypertension: Secondary | ICD-10-CM | POA: Diagnosis not present

## 2017-05-12 DIAGNOSIS — E11649 Type 2 diabetes mellitus with hypoglycemia without coma: Secondary | ICD-10-CM

## 2017-05-12 DIAGNOSIS — J449 Chronic obstructive pulmonary disease, unspecified: Secondary | ICD-10-CM | POA: Diagnosis not present

## 2017-05-12 MED ORDER — LOSARTAN POTASSIUM 100 MG PO TABS: 100.0000 mg | ORAL_TABLET | Freq: Every day | ORAL | 3 refills | Status: DC

## 2017-05-12 NOTE — Progress Notes (Signed)
Patient: [name] Female    DOB: 02/17/1929   83 y.o.   MRN: [id number] Visit Date: 05/12/2017  Today's Provider: Wilhemena Durie, MD   Chief Complaint  Patient presents with  . Hypertension  . Follow-up   Subjective:    HPI  Hypertension, follow-up:  BP Readings from Last 3 Encounters:  05/12/17 (!) 162/62  04/06/17 (!) 158/60  03/03/17 (!) 148/78    She was last seen for hypertension 4 weeks ago.  BP at that visit was 158/60 Management since that visit includes diltiazem 1 tablet daily instead of increasing dose. She was not taking medication at all.  She reports good compliance with treatment. She is not having side effects.  She is seldom exercising. She is adherent to low salt diet.   Outside blood pressures are being checked occasionally, when monitor is working properly. She is experiencing dyspnea and fatigue.  Patient denies chest pain, chest pressure/discomfort, claudication, exertional chest pressure/discomfort, irregular heart beat, lower extremity edema, near-syncope, orthopnea, palpitations and syncope.   Cardiovascular risk factors include advanced age (older than 71 for men, 67 for women), diabetes mellitus, dyslipidemia and hypertension.    On 02/15/2017 patient was advised to discontinue Losartan-HCTZ secondary to cough. She states since stopping the medication she is still coughing, so she doesn't think it was a side effect of meication. She is requesting to restart medication since it managed her blood pressure so well if agree with treatment plan.    Previous Medications   ACETAMINOPHEN (TYLENOL) 500 MG TABLET    Take 1,000 mg by mouth every 6 (six) hours as needed.   ASCORBIC ACID (VITAMIN C) 1000 MG TABLET    Take 1,000 mg by mouth daily.    ASPIRIN 325 MG TABLET    Take 325 mg by mouth daily. Reported on 01/05/2016   ASSURE COMFORT LANCETS 30G MISC       BLOOD GLUCOSE CALIBRATION (ACCU-CHEK SMARTVIEW CONTROL) LIQD       CETIRIZINE  (ZYRTEC) 10 MG TABLET    TAKE 1 TABLET BY MOUTH DAILY   CHOLECALCIFEROL (VITAMIN D) 1000 UNITS TABLET    Take 1,000 Units by mouth daily.   DILTIAZEM (CARDIZEM CD) 180 MG 24 HR CAPSULE    1 po daily   DONEPEZIL (ARICEPT) 5 MG TABLET    Take 1 tablet by mouth daily.   FLUOXETINE (PROZAC) 40 MG CAPSULE    TAKE 1 CAPSULE BY MOUTH DAILY   FLUTICASONE (FLOVENT HFA) 220 MCG/ACT INHALER    Inhale 2 puffs into the lungs 2 (two) times daily.   GLUCOSE BLOOD TEST STRIP    ACCU-CHEK AVIVA PLUS TEST STRP Use to check sugars 3 times daily.   INSULIN GLARGINE (LANTUS) 100 UNIT/ML INJECTION    Inject 0.05 mLs (5 Units total) into the skin at bedtime.   INSULIN LISPRO (HUMALOG KWIKPEN) 100 UNIT/ML KIWKPEN    Inject into the skin. 12 units in the am, 10 units at lunch and 12 units in the pm   MAGNESIUM 400 MG CAPS    Take by mouth daily.   MONTELUKAST (SINGULAIR) 10 MG TABLET    TAKE ONE TABLET BY MOUTH AT BEDTIME   MULTIPLE VITAMINS-MINERALS (ICAPS AREDS 2 PO)    Take by mouth.   NAPROXEN SODIUM (ANAPROX) 220 MG TABLET    Take 220 mg by mouth 2 (two) times daily as needed.    PANTOPRAZOLE (PROTONIX) 40 MG TABLET    TAKE ONE TABLET BY MOUTH EVERY  DAY   POTASSIUM CHLORIDE (K-DUR) 10 MEQ TABLET    TAKE ONE TABLET BY MOUTH TWICE DAILY   SURE COMFORT PEN NEEDLES 31G X 5 MM MISC       TERCONAZOLE (TERAZOL 7) 0.4 % VAGINAL CREAM    Can apply thin layer to external vaginal nightly. If needed, can use one applicator inserted vaginally before bed for 7 days.   TRAZODONE (DESYREL) 50 MG TABLET    Take 50 mg by mouth at bedtime.     Review of Systems  Constitutional: Positive for fatigue.  HENT: Negative.   Eyes: Negative.   Respiratory: Positive for cough and shortness of breath.        Dyspnea   Cardiovascular: Positive for chest pain. Negative for palpitations and leg swelling.  Endocrine: Negative.   Musculoskeletal: Negative.   Allergic/Immunologic: Negative.   Psychiatric/Behavioral: Negative.     Social  History  Substance Use Topics  . Smoking status: Never Smoker  . Smokeless tobacco: Never Used  . Alcohol use No   Objective:   BP (!) 162/62 (BP Location: Left Arm, Patient Position: Sitting, Cuff Size: Normal)   Pulse 72   Temp 98.1 F (36.7 C) (Oral)   Wt 149 lb 12.8 oz (67.9 kg)   SpO2 95%   BMI 25.71 kg/m   Physical Exam  Constitutional: She is oriented to person, place, and time. She appears well-developed and well-nourished.  Eyes: Pupils are equal, round, and reactive to light. Conjunctivae are normal.  Cardiovascular: Normal rate, regular rhythm, normal heart sounds and intact distal pulses.  Exam reveals no gallop.   No murmur heard. Pulmonary/Chest: Effort normal and breath sounds normal. No respiratory distress. She has no wheezes.  Neurological: She is alert and oriented to person, place, and time.      Assessment & Plan:  1. Benign essential HTN Restart Losartan. B/P elevated and cough did not improve since stopping this medication so will re start. Advised patient and her daughter to find out if patient is taking Diltiazem and if she is still add Losartan. Follow up 1 month.  2. Type 2 diabetes mellitus with hypoglycemia without coma, without long-term current use of insulin [facility]) Per endocrinology.  3. MCI (mild cognitive impairment) MMSE next OV, 4. Chronic obstructive pulmonary disease, unspecified COPD type ([place]) Stable. HPI, Exam and A&P transcribed by Theressa Millard, RMA under direction and in the presence of Miguel Aschoff, MD. I have done the exam and reviewed the chart and it is accurate to the best of my knowledge. Development worker, community has been used and  any errors in dictation or transcription are unintentional. Miguel Aschoff M.D. [facility]

## 2017-05-19 ENCOUNTER — Ambulatory Visit (INDEPENDENT_AMBULATORY_CARE_PROVIDER_SITE_OTHER): Payer: Medicare Other | Admitting: Internal Medicine

## 2017-05-19 ENCOUNTER — Encounter: Payer: Self-pay | Admitting: Internal Medicine

## 2017-05-19 VITALS — BP 142/78 | HR 83 | Resp 16 | Ht 64.0 in | Wt 148.0 lb

## 2017-05-19 DIAGNOSIS — J449 Chronic obstructive pulmonary disease, unspecified: Secondary | ICD-10-CM | POA: Diagnosis not present

## 2017-05-19 MED ORDER — ALBUTEROL SULFATE HFA 108 (90 BASE) MCG/ACT IN AERS: 2.0000 | INHALATION_SPRAY | RESPIRATORY_TRACT | 6 refills | Status: DC | PRN

## 2017-05-19 MED ORDER — FLUTICASONE PROPIONATE HFA 220 MCG/ACT IN AERO: 2.0000 | INHALATION_SPRAY | Freq: Two times a day (BID) | RESPIRATORY_TRACT | 6 refills | Status: DC

## 2017-05-19 NOTE — Progress Notes (Signed)
   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  HENT: Negative for hearing loss, ear pain, nosebleeds, congestion, sore throat, neck pain, tinnitus and ear discharge.   Eyes: Negative for blurred vision, double vision, photophobia, pain, discharge and redness.  Respiratory: +cough, hemoptysis, +sputum production, shortness of breath, wheezing and stridor.   Cardiovascular: Negative for chest pain, palpitations, orthopnea, claudication, leg swelling and PND.   ALL OTHER ROS ARE NEGATIVE  BP (!) 142/78 (BP Location: Left Arm, Cuff Size: Normal)   Pulse 83   Resp 16   Ht 5\' 4"  (1.626 m)   Wt 148 lb (67.1 kg)   SpO2 94%   BMI 25.40 kg/m   Physical Examination:   GENERAL:NAD, no fevers, chills, no weakness no fatigue HEAD: Normocephalic, atraumatic.  EYES: Pupils equal, round, reactive to light. Extraocular muscles intact. No scleral icterus.  MOUTH:  Moist mucosal membrane.   EAR, NOSE, THROAT: Clear without exudates. No external lesions.  NECK: Supple. No thyromegaly. No nodules. No JVD.  PULMONARY:CTA B/L no wheezes, no crackles, no rhonchi CARDIOVASCULAR: S1 and S2. Regular rate and rhythm. No murmurs, rubs, or gallops. No edema.    CT chest Oct 2017 I have Independently reviewed images of  CT scan   Interpretation:no PE, no effusions, no opacities noted, NL CT scan There is some evidence of bronchiectasis in lower lobes    ASSESSMENT / PLAN: 83 yo white female with chronic cough with chronic sputum production with obstructive pattern on flow volume loops this is suggestive of mild COPD with chronic bronchitis in the setting of bilateral lower lobe bronchiectasis  1.chronic cough and chronic sputum production-c/w chronic bronchitis/COPD -will need to avoid second hand smoke exposure  2.MILD COPD with chronic Bronchitis -Continue Flovent 220 BID -Will add albuterol 2 puffs every 4 hours as needed for intermittent wheezing  3.Bronchiectasis -will recommend flutter valve and incentive spirometry   Patient/Family are satisfied with Plan of action and management. All questions answered Follow up in 6 months for re-assessment  Corrin Parker, M.D.  [facility]  Medical Director [place] Director [facility] Cardio-Pulmonary Department

## 2017-05-19 NOTE — Patient Instructions (Signed)
Continue Flovent as prescribed Add albuterol 2 puffs every 4 hours as needed

## 2017-05-26 ENCOUNTER — Telehealth: Payer: Self-pay | Admitting: Family Medicine

## 2017-05-26 NOTE — Telephone Encounter (Signed)
Patient advised to keep track of daily BP readings and bring to follow up appointment scheduled for 06/07/17.

## 2017-05-26 NOTE — Telephone Encounter (Signed)
Keep track of daily home BP readings--we will treat trend of BP.

## 2017-05-26 NOTE — Telephone Encounter (Signed)
Pt called to report her blood pressure has been around 171/77.  She was suppose to let you know if it has been high.  Thanks Con Memos

## 2017-05-26 NOTE — Telephone Encounter (Signed)
Pt advised-aa 

## 2017-05-31 ENCOUNTER — Encounter: Payer: Self-pay | Admitting: Family Medicine

## 2017-06-07 ENCOUNTER — Ambulatory Visit (INDEPENDENT_AMBULATORY_CARE_PROVIDER_SITE_OTHER): Payer: Medicare Other | Admitting: Family Medicine

## 2017-06-07 ENCOUNTER — Other Ambulatory Visit: Payer: Self-pay | Admitting: Family Medicine

## 2017-06-07 VITALS — BP 124/50 | HR 80 | Temp 98.0°F | Resp 14 | Wt 148.8 lb

## 2017-06-07 DIAGNOSIS — E784 Other hyperlipidemia: Secondary | ICD-10-CM

## 2017-06-07 DIAGNOSIS — E7849 Other hyperlipidemia: Secondary | ICD-10-CM

## 2017-06-07 DIAGNOSIS — G3183 Dementia with Lewy bodies: Secondary | ICD-10-CM

## 2017-06-07 DIAGNOSIS — I10 Essential (primary) hypertension: Secondary | ICD-10-CM | POA: Diagnosis not present

## 2017-06-07 DIAGNOSIS — E11649 Type 2 diabetes mellitus with hypoglycemia without coma: Secondary | ICD-10-CM | POA: Diagnosis not present

## 2017-06-07 DIAGNOSIS — Z2821 Immunization not carried out because of patient refusal: Secondary | ICD-10-CM | POA: Diagnosis not present

## 2017-06-07 DIAGNOSIS — R443 Hallucinations, unspecified: Secondary | ICD-10-CM

## 2017-06-07 DIAGNOSIS — F0280 Dementia in other diseases classified elsewhere without behavioral disturbance: Secondary | ICD-10-CM

## 2017-06-07 DIAGNOSIS — Z789 Other specified health status: Secondary | ICD-10-CM | POA: Diagnosis not present

## 2017-06-07 NOTE — Progress Notes (Signed)
[name]  MRN: [id number] DOB: Dec 21, 1928  Subjective:  HPI   Patient is here for 1 month follow up. Last office visit was on 05/12/17. HTN: at that time patient was advised to re start Losartan. Patient has not been able to check her b/p at home due to her b/p machine not working.  Patient is also taking Diltiazem. BP Readings from Last 3 Encounters:  06/07/17 (!) 124/50  05/19/17 (!) 142/78  05/12/17 (!) 162/62   MMSE done today and score is 27/30. Patient did see Dr Manuella Ghazi in 2017 last for confusion, memory issues but patient has not seen him since and does not remember them telling her to come back. She does not feel like she has memory issues. She does have visual hallucinations-she sees her first husband and other family members -not seen strangers, some auditory hallucinations sometimes like hearing a song that is not playing, she is not hearing any voices that tell her do anything like harming herself.  MMSE - Mini Mental State Exam 06/07/2017  Orientation to time 5  Orientation to Place 5  Registration 3  Attention/ Calculation 5  Recall 2  Language- name 2 objects 2  Language- repeat 1  Language- follow 3 step command 2  Language- read & follow direction 0  Write a sentence 1  Copy design 1  Total score 27    Patient Active Problem List   Diagnosis Date Noted  . Pain in limb 07/27/2016  . Hallucinations 07/07/2016  . Chest pain 07/01/2016  . Hypokalemia 09/03/2015  . Insomnia 07/23/2015  . Headache 07/23/2015  . Type 2 diabetes mellitus ([place]) 06/24/2015  . Chronic vulvitis 06/02/2015  . Allergic rhinitis 05/30/2015  . Absolute anemia 05/30/2015  . Back ache 05/30/2015  . Body mass index (BMI) of 29.0-29.9 in adult 05/30/2015  . Edema extremities 05/30/2015  . Dilatation of esophagus 05/30/2015  . Fall 05/30/2015  . H/O deep venous thrombosis 05/30/2015  . Hypercholesteremia 05/30/2015  . Heart & renal disease, hypertensive, with heart failure  ([place]) 05/30/2015  . Below normal amount of sodium in the blood 05/30/2015  . Calculus of kidney 05/30/2015  . Gonalgia 05/30/2015  . Psoriasis 05/30/2015  . Avitaminosis D 05/30/2015  . Cough 04/01/2015  . Combined fat and carbohydrate induced hyperlipemia 12/30/2014  . Paroxysmal atrial fibrillation ([place]) 07/10/2014  . Abnormal gait 07/08/2014  . Anxiety state 07/08/2014  . Chronic kidney disease 07/08/2014  . Acid reflux 07/08/2014  . 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[place]) 07/08/2014  . Chronic obstructive pulmonary disease ([place]) 07/08/2014  . Benign essential HTN 06/26/2014  . Carotid artery narrowing 06/26/2014  . Intermittent claudication ([place]) 06/26/2014  . Basal cell carcinoma of face 05/01/2014  . Disease of female genital organs 11/28/2012  . Incomplete bladder emptying 09/05/2012  . Excessive urination at night 08/15/2012  . Basal cell carcinoma of ear 10/26/2011    Past Medical History:  Diagnosis Date  . Angina pectoris ([place])   . Atrial fibrillation ([place])   . Cervicalgia   . Chronic back pain   . Chronic obstructive pulmonary disease (COPD) ([place])   . COPD (chronic obstructive pulmonary disease) ([place])   . DDD (degenerative disc disease), lumbar   . Diabetes mellitus without complication ([place])   . Dysphagia   . Hyperlipemia   . Hypertension   . Vulvovaginitis     Social History   Social History  . Marital status: Widowed    Spouse name: N/A  . Number of children:  N/A  . Years of education: N/A   Occupational History  . Not on file.   Social History Main Topics  . Smoking status: Never Smoker  . Smokeless tobacco: Never Used  . Alcohol use No  . Drug use: No  . Sexual activity: No   Other Topics Concern  . Not on file   Social History Narrative  . No narrative on file    Outpatient Encounter Prescriptions as of 06/07/2017  Medication Sig  . albuterol (PROVENTIL HFA;VENTOLIN HFA) 108 (90 Base) MCG/ACT inhaler Inhale 2 puffs into the lungs every 4  (four) hours as needed for wheezing or shortness of breath.  . Ascorbic Acid (VITAMIN C) 1000 MG tablet Take 1,000 mg by mouth daily.   Marland Kitchen aspirin 325 MG tablet Take 325 mg by mouth daily. Reported on 01/05/2016  . ASSURE COMFORT LANCETS 30G MISC   . benzonatate (TESSALON) 100 MG capsule Take 100 mg by mouth 3 (three) times daily as needed for cough.  . Blood Glucose Calibration (ACCU-CHEK SMARTVIEW CONTROL) LIQD   . cetirizine (ZYRTEC) 10 MG tablet TAKE 1 TABLET BY MOUTH DAILY  . diltiazem (DILACOR XR) 180 MG 24 hr capsule Take 180 mg by mouth daily.  Marland Kitchen donepezil (ARICEPT) 5 MG tablet Take 1 tablet by mouth daily.  Marland Kitchen FLUoxetine (PROZAC) 40 MG capsule TAKE 1 CAPSULE BY MOUTH DAILY  . fluticasone (FLOVENT HFA) 220 MCG/ACT inhaler Inhale 2 puffs into the lungs 2 (two) times daily.  Marland Kitchen glucose blood test strip ACCU-CHEK AVIVA PLUS TEST STRP Use to check sugars 3 times daily.  . insulin glargine (LANTUS) 100 UNIT/ML injection Inject 0.05 mLs (5 Units total) into the skin at bedtime. (Patient taking differently: Inject 10 Units into the skin at bedtime. )  . insulin lispro (HUMALOG KWIKPEN) 100 UNIT/ML KiwkPen Inject into the skin. 12 units in the am, 10 units at lunch and 12 units in the pm  . losartan (COZAAR) 100 MG tablet Take 1 tablet (100 mg total) by mouth daily.  . Magnesium 400 MG CAPS Take by mouth daily.  . montelukast (SINGULAIR) 10 MG tablet TAKE ONE TABLET BY MOUTH AT BEDTIME  . Multiple Vitamins-Minerals (ICAPS AREDS 2 PO) Take by mouth.  . pantoprazole (PROTONIX) 40 MG tablet TAKE ONE TABLET BY MOUTH EVERY DAY  . vitamin E 1000 UNIT capsule Take 1,000 Units by mouth daily.  . potassium chloride (K-DUR) 10 MEQ tablet TAKE ONE TABLET BY MOUTH TWICE DAILY (Patient not taking: Reported on 06/07/2017)  . SURE COMFORT PEN NEEDLES 31G X 5 MM MISC   . [DISCONTINUED] acetaminophen (TYLENOL) 500 MG tablet Take 1,000 mg by mouth every 6 (six) hours as needed.  . [DISCONTINUED] cholecalciferol  (VITAMIN D) 1000 units tablet Take 1,000 Units by mouth daily.  . [DISCONTINUED] naproxen sodium (ANAPROX) 220 MG tablet Take 220 mg by mouth 2 (two) times daily as needed.   . [DISCONTINUED] terconazole (TERAZOL 7) 0.4 % vaginal cream Can apply thin layer to external vaginal nightly. If needed, can use one applicator inserted vaginally before bed for 7 days.  . [DISCONTINUED] traZODone (DESYREL) 50 MG tablet Take 50 mg by mouth at bedtime.    No facility-administered encounter medications on file as of 06/07/2017.     Allergies  Allergen Reactions  . Neomycin-Bacitracin Zn-Polymyx Swelling  . Benzalkonium Chloride Itching and Swelling  . Ibuprofen     Other reaction(s): Dizziness Heart fluttering. Tachycardia. Tachycardia.  . Valdecoxib Nausea And Vomiting  . Albuterol Rash  .  Latex Rash  . Tape Rash    blisters  . Triamcinolone Rash    Review of Systems  Constitutional: Negative.   Respiratory: Negative.   Cardiovascular: Positive for leg swelling (ankle swelling at night time.).  Gastrointestinal: Negative.   Musculoskeletal: Negative.   Neurological: Negative.   Psychiatric/Behavioral: Positive for hallucinations.    Objective:  BP (!) 124/50   Pulse 80   Temp 98 F (36.7 C)   Resp 14   Wt 148 lb 12.8 oz (67.5 kg)   BMI 25.54 kg/m   Physical Exam  Constitutional: She is oriented to person, place, and time and well-developed, well-nourished, and in no distress.  HENT:  Head: Normocephalic and atraumatic.  Right Ear: External ear normal.  Left Ear: External ear normal.  Nose: Nose normal.  Eyes: Pupils are equal, round, and reactive to light. Conjunctivae are normal. No scleral icterus.  Neck: No thyromegaly present.  Cardiovascular: Normal rate, regular rhythm, normal heart sounds and intact distal pulses.  Exam reveals no gallop.   No murmur heard. Pulmonary/Chest: Effort normal and breath sounds normal. No respiratory distress. She has no wheezes.    Abdominal: Soft.  Neurological: She is alert and oriented to person, place, and time.  Skin: Skin is warm and dry.  Psychiatric: Mood, memory, affect and judgment normal.   Assessment and Plan :  1. Benign essential HTN Much better. Continue current medication.  2. Lewy body dementia without behavioral disturbance MMSE today 27/30. Patient advised to follow up with Dr Manuella Ghazi.  3. Type 2 diabetes mellitus with hypoglycemia without coma, without long-term current use of insulin ([place]) Sees Dr Gabriel Carina.  4. Hallucinations 5. Other hyperlipidemia Check all lab work today.  6. Influenza vaccination declined Patient wants to wait till October. Appointment made for then.  7. Statin intolerance Consider Zetia, pending lab results.  HPI, Exam and A&P transcribed by Tiffany Kocher, RMA under direction and in the presence of Miguel Aschoff, MD.

## 2017-06-08 ENCOUNTER — Telehealth: Payer: Self-pay

## 2017-06-08 LAB — CBC WITH DIFFERENTIAL/PLATELET
BASOS PCT: 0.9 %
Basophils Absolute: 78 cells/uL (ref 0–200)
Eosinophils Absolute: 348 cells/uL (ref 15–500)
Eosinophils Relative: 4 %
HCT: 39 % (ref 35.0–45.0)
Hemoglobin: 12.4 g/dL (ref 11.7–15.5)
Lymphs Abs: 3080 cells/uL (ref 850–3900)
MCH: 26.3 pg — ABNORMAL LOW (ref 27.0–33.0)
MCHC: 31.8 g/dL — ABNORMAL LOW (ref 32.0–36.0)
MCV: 82.8 fL (ref 80.0–100.0)
MPV: 10.9 fL (ref 7.5–12.5)
Monocytes Relative: 10.7 %
NEUTROS ABS: 4263 {cells}/uL (ref 1500–7800)
Neutrophils Relative %: 49 %
PLATELETS: 268 10*3/uL (ref 140–400)
RBC: 4.71 10*6/uL (ref 3.80–5.10)
RDW: 13.6 % (ref 11.0–15.0)
TOTAL LYMPHOCYTE: 35.4 %
WBC: 8.7 10*3/uL (ref 3.8–10.8)
WBCMIX: 931 {cells}/uL (ref 200–950)

## 2017-06-08 LAB — COMPLETE METABOLIC PANEL WITH GFR
AG RATIO: 1.4 (calc) (ref 1.0–2.5)
ALBUMIN MSPROF: 3.9 g/dL (ref 3.6–5.1)
ALT: 15 U/L (ref 6–29)
AST: 20 U/L (ref 10–35)
Alkaline phosphatase (APISO): 95 U/L (ref 33–130)
BILIRUBIN TOTAL: 0.4 mg/dL (ref 0.2–1.2)
BUN / CREAT RATIO: 16 (calc) (ref 6–22)
BUN: 19 mg/dL (ref 7–25)
CHLORIDE: 96 mmol/L — AB (ref 98–110)
CO2: 30 mmol/L (ref 20–32)
Calcium: 9.1 mg/dL (ref 8.6–10.4)
Creat: 1.19 mg/dL — ABNORMAL HIGH (ref 0.60–0.88)
GFR, EST AFRICAN AMERICAN: 48 mL/min/{1.73_m2} — AB (ref >=60)
GFR, Est Non African American: 41 mL/min/{1.73_m2} — ABNORMAL LOW (ref >=60)
Globulin: 2.8 g/dL (calc) (ref 1.9–3.7)
Glucose, Bld: 163 mg/dL — ABNORMAL HIGH (ref 65–139)
POTASSIUM: 3.8 mmol/L (ref 3.5–5.3)
Sodium: 135 mmol/L (ref 135–146)
TOTAL PROTEIN: 6.7 g/dL (ref 6.1–8.1)

## 2017-06-08 LAB — LIPID PANEL
CHOLESTEROL: 226 mg/dL — AB (ref <200)
HDL: 49 mg/dL — ABNORMAL LOW (ref >50)
LDL CHOLESTEROL (CALC): 125 mg/dL — AB
Non-HDL Cholesterol (Calc): 177 mg/dL (calc) — ABNORMAL HIGH (ref <130)
TRIGLYCERIDES: 367 mg/dL — AB (ref <150)
Total CHOL/HDL Ratio: 4.6 (calc) (ref <5.0)

## 2017-06-08 LAB — TSH: TSH: 2.08 mIU/L (ref 0.40–4.50)

## 2017-06-08 MED ORDER — EZETIMIBE 10 MG PO TABS: 10.0000 mg | ORAL_TABLET | Freq: Every day | ORAL | 0 refills | Status: DC

## 2017-06-08 NOTE — Telephone Encounter (Signed)
-----   Message from Jerrol Banana., MD sent at 06/08/2017 11:48 AM EDT ----- Labs stable. Consider Zetia for cholesterol treatment in  statin intolerant patient.

## 2017-06-08 NOTE — Telephone Encounter (Signed)
Patient advised. Patient is willing to try Zetia but for 1 week first to see how she does and then she will call back for more- Kris Mouton, RMA

## 2017-06-14 ENCOUNTER — Ambulatory Visit: Payer: Medicare Other | Admitting: Family Medicine

## 2017-06-16 ENCOUNTER — Encounter: Payer: Self-pay | Admitting: Family Medicine

## 2017-06-16 ENCOUNTER — Other Ambulatory Visit: Payer: Self-pay | Admitting: Family Medicine

## 2017-06-16 MED ORDER — EZETIMIBE 10 MG PO TABS: 10.0000 mg | ORAL_TABLET | Freq: Every day | ORAL | 12 refills | Status: DC

## 2017-06-16 NOTE — Telephone Encounter (Signed)
Pt contacted office for refill request on the following medications:  ezetimibe (ZETIA) 10 MG tablet  [facility].  CB#[phone]

## 2017-06-23 ENCOUNTER — Ambulatory Visit (INDEPENDENT_AMBULATORY_CARE_PROVIDER_SITE_OTHER): Payer: Medicare Other | Admitting: Physician Assistant

## 2017-06-23 ENCOUNTER — Encounter: Payer: Self-pay | Admitting: Physician Assistant

## 2017-06-23 ENCOUNTER — Telehealth: Payer: Self-pay | Admitting: Family Medicine

## 2017-06-23 VITALS — BP 134/62 | HR 72 | Temp 98.2°F | Resp 16 | Wt 146.0 lb

## 2017-06-23 DIAGNOSIS — J069 Acute upper respiratory infection, unspecified: Secondary | ICD-10-CM

## 2017-06-23 DIAGNOSIS — I130 Hypertensive heart and chronic kidney disease with heart failure and stage 1 through stage 4 chronic kidney disease, or unspecified chronic kidney disease: Secondary | ICD-10-CM

## 2017-06-23 DIAGNOSIS — J449 Chronic obstructive pulmonary disease, unspecified: Secondary | ICD-10-CM

## 2017-06-23 DIAGNOSIS — E11649 Type 2 diabetes mellitus with hypoglycemia without coma: Secondary | ICD-10-CM

## 2017-06-23 DIAGNOSIS — B9789 Other viral agents as the cause of diseases classified elsewhere: Secondary | ICD-10-CM

## 2017-06-23 MED ORDER — AZITHROMYCIN 250 MG PO TABS: ORAL_TABLET | ORAL | 0 refills | Status: DC

## 2017-06-23 NOTE — Telephone Encounter (Signed)
error 

## 2017-06-23 NOTE — Progress Notes (Signed)
[place]  Chief Complaint  Patient presents with  . URI    Started Tuesday    Subjective:    Patient ID: [name], female    DOB: 06/25/1929, 83 y.o.   MRN: [id number]  Upper Respiratory Infection: DREW HERMAN is a 83 y.o. female with a past medical history significant for Type II DM, COPD, and Hypertensive heart and renal disease complaining of symptoms of a URI, possible sinusitis. Symptoms include congestion, cough, diarrhea and swollen glands. Onset of symptoms was 2 days ago, gradually worsening since that time. She also c/o congestion, cough described as productive, post nasal drip, sinus pressure, sneezing and sore throat for the past 2 days . Says she is coughing up yellow phlegm. She is drinking plenty of fluids. Evaluation to date: none. Treatment to date: none. Pt says she is not sure what she can take with all of the medicines she is taking.  The treatment has provided no.   Review of Systems  Constitutional: Positive for fatigue. Negative for activity change, chills, diaphoresis, fever and unexpected weight change.  HENT: Positive for congestion, postnasal drip, rhinorrhea, sinus pain, sinus pressure, sneezing, sore throat and voice change. Negative for ear discharge, ear pain, nosebleeds, tinnitus and trouble swallowing.   Eyes: Positive for discharge, redness and itching. Negative for photophobia, pain and visual disturbance.  Respiratory: Positive for cough, chest tightness, shortness of breath and wheezing. Negative for apnea, choking and stridor.   Gastrointestinal: Positive for diarrhea and vomiting. Negative for abdominal distention, abdominal pain, anal bleeding, blood in stool, constipation, nausea and rectal pain.  Neurological: Positive for light-headedness and headaches. Negative for dizziness.       Objective:   BP 134/62 (BP Location: Left Arm, Patient Position: Sitting, Cuff Size: Normal)   Pulse 72   Temp 98.2 F (36.8 C)  (Oral)   Resp 16   Wt 146 lb (66.2 kg)   SpO2 98%   BMI 25.06 kg/m   Patient Active Problem List   Diagnosis Date Noted  . Pain in limb 07/27/2016  . Hallucinations 07/07/2016  . Chest pain 07/01/2016  . Hypokalemia 09/03/2015  . Insomnia 07/23/2015  . Headache 07/23/2015  . Type 2 diabetes mellitus ([place]) 06/24/2015  . Chronic vulvitis 06/02/2015  . Allergic rhinitis 05/30/2015  . Absolute anemia 05/30/2015  . Back ache 05/30/2015  . Body mass index (BMI) of 29.0-29.9 in adult 05/30/2015  . Edema extremities 05/30/2015  . Dilatation of esophagus 05/30/2015  . Fall 05/30/2015  . H/O deep venous thrombosis 05/30/2015  . Hypercholesteremia 05/30/2015  . Heart & renal disease, hypertensive, with heart failure ([place]) 05/30/2015  . Below normal amount of sodium in the blood 05/30/2015  . Calculus of kidney 05/30/2015  . Gonalgia 05/30/2015  . Psoriasis 05/30/2015  . Avitaminosis D 05/30/2015  . Cough 04/01/2015  . Combined fat and carbohydrate induced hyperlipemia 12/30/2014  . Paroxysmal atrial fibrillation ([place]) 07/10/2014  . Abnormal gait 07/08/2014  . Anxiety state 07/08/2014  . Chronic kidney disease 07/08/2014  . Acid reflux 07/08/2014  . 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[place]) 07/08/2014  . Chronic obstructive pulmonary disease ([place]) 07/08/2014  . Benign essential HTN 06/26/2014  . Carotid artery narrowing 06/26/2014  . Intermittent claudication ([place]) 06/26/2014  . Basal cell carcinoma of face 05/01/2014  . Disease of female genital organs 11/28/2012  . Incomplete bladder emptying 09/05/2012  . Excessive urination at night 08/15/2012  . Basal cell carcinoma of ear 10/26/2011    Outpatient Encounter  Prescriptions as of 06/23/2017  Medication Sig  . albuterol (PROVENTIL HFA;VENTOLIN HFA) 108 (90 Base) MCG/ACT inhaler Inhale 2 puffs into the lungs every 4 (four) hours as needed for wheezing or shortness of breath.  . Ascorbic Acid (VITAMIN C) 1000 MG tablet Take 1,000 mg  by mouth daily.   Marland Kitchen aspirin 325 MG tablet Take 325 mg by mouth daily. Reported on 01/05/2016  . ASSURE COMFORT LANCETS 30G MISC   . Blood Glucose Calibration (ACCU-CHEK SMARTVIEW CONTROL) LIQD   . cetirizine (ZYRTEC) 10 MG tablet TAKE 1 TABLET BY MOUTH DAILY  . diltiazem (DILACOR XR) 180 MG 24 hr capsule Take 180 mg by mouth daily.  Marland Kitchen donepezil (ARICEPT) 5 MG tablet Take 1 tablet by mouth daily.  Marland Kitchen ezetimibe (ZETIA) 10 MG tablet Take 1 tablet (10 mg total) by mouth daily.  Marland Kitchen FLUoxetine (PROZAC) 40 MG capsule TAKE 1 CAPSULE BY MOUTH EVERY DAY  . fluticasone (FLOVENT HFA) 220 MCG/ACT inhaler Inhale 2 puffs into the lungs 2 (two) times daily.  Marland Kitchen glucose blood test strip ACCU-CHEK AVIVA PLUS TEST STRP Use to check sugars 3 times daily.  . insulin glargine (LANTUS) 100 UNIT/ML injection Inject 0.05 mLs (5 Units total) into the skin at bedtime. (Patient taking differently: Inject 10 Units into the skin at bedtime. )  . insulin lispro (HUMALOG KWIKPEN) 100 UNIT/ML KiwkPen Inject into the skin. 12 units in the am, 10 units at lunch and 12 units in the pm  . losartan (COZAAR) 100 MG tablet Take 1 tablet (100 mg total) by mouth daily.  . montelukast (SINGULAIR) 10 MG tablet TAKE ONE TABLET BY MOUTH AT BEDTIME  . Multiple Vitamins-Minerals (ICAPS AREDS 2 PO) Take by mouth.  . pantoprazole (PROTONIX) 40 MG tablet TAKE ONE TABLET BY MOUTH EVERY DAY  . potassium chloride (K-DUR) 10 MEQ tablet TAKE ONE TABLET BY MOUTH TWICE DAILY  . SURE COMFORT PEN NEEDLES 31G X 5 MM MISC   . vitamin E 1000 UNIT capsule Take 1,000 Units by mouth daily.  Marland Kitchen azithromycin (ZITHROMAX) 250 MG tablet Take 2 pills on day 1, then one pill on each of the following four days.  . benzonatate (TESSALON) 100 MG capsule Take 100 mg by mouth 3 (three) times daily as needed for cough.  . Magnesium 400 MG CAPS Take by mouth daily.   No facility-administered encounter medications on file as of 06/23/2017.     Allergies  Allergen Reactions   . Neomycin-Bacitracin Zn-Polymyx Swelling  . Benzalkonium Chloride Itching and Swelling  . Ibuprofen     Other reaction(s): Dizziness Heart fluttering. Tachycardia. Tachycardia.  . Valdecoxib Nausea And Vomiting  . Albuterol Rash  . Latex Rash  . Tape Rash    blisters  . Triamcinolone Rash       Physical Exam  Constitutional: She is oriented to person, place, and time. She appears well-developed and well-nourished.  Neck: Neck supple.  Cardiovascular: Normal rate and regular rhythm.   Pulmonary/Chest: Effort normal and breath sounds normal. No respiratory distress. She has no wheezes. She has no rales.  Patient speaking in full sentences. No use of accessory muscles. Good air movement throughout lungs.   Lymphadenopathy:    She has no cervical adenopathy.  Neurological: She is alert and oriented to person, place, and time.  Skin: Skin is warm and dry.  Psychiatric: She has a normal mood and affect. Her behavior is normal.       Assessment & Plan:  1. Viral URI with  cough  Do think it's viral, will cover for COPD exacerbation with Z-pack.   2. Chronic obstructive pulmonary disease, unspecified COPD type ([place])  - azithromycin (ZITHROMAX) 250 MG tablet; Take 2 pills on day 1, then one pill on each of the following four days.  Dispense: 6 tablet; Refill: 0  3. Heart & renal disease, hypertensive, with heart failure ([place])  Normotensive today.  4. Type 2 diabetes mellitus with hypoglycemia without coma, without long-term current use of insulin (HCC)  No hypoglycemia episodes, increased urination. One episode of vomiting several days ago after taking new medication.  The entirety of the information documented in the History of Present Illness, Review of Systems and Physical Exam were personally obtained by me. Portions of this information were initially documented by Ashley Royalty, CMA and reviewed by me for thoroughness and accuracy.   Return if symptoms worsen or fail to  improve.

## 2017-06-23 NOTE — Patient Instructions (Signed)
Upper Respiratory Infection, Adult Most upper respiratory infections (URIs) are caused by a virus. A URI affects the nose, throat, and upper air passages. The most common type of URI is often called "the common cold." Follow these instructions at home:  Take medicines only as told by your doctor.  Gargle warm saltwater or take cough drops to comfort your throat as told by your doctor.  Use a warm mist humidifier or inhale steam from a shower to increase air moisture. This may make it easier to breathe.  Drink enough fluid to keep your pee (urine) clear or pale yellow.  Eat soups and other clear broths.  Have a healthy diet.  Rest as needed.  Go back to work when your fever is gone or your doctor says it is okay. ? You may need to stay home longer to avoid giving your URI to others. ? You can also wear a face mask and wash your hands often to prevent spread of the virus.  Use your inhaler more if you have asthma.  Do not use any tobacco products, including cigarettes, chewing tobacco, or electronic cigarettes. If you need help quitting, ask your doctor. Contact a doctor if:  You are getting worse, not better.  Your symptoms are not helped by medicine.  You have chills.  You are getting more short of breath.  You have brown or red mucus.  You have yellow or brown discharge from your nose.  You have pain in your face, especially when you bend forward.  You have a fever.  You have puffy (swollen) neck glands.  You have pain while swallowing.  You have white areas in the back of your throat. Get help right away if:  You have very bad or constant: ? Headache. ? Ear pain. ? Pain in your forehead, behind your eyes, and over your cheekbones (sinus pain). ? Chest pain.  You have long-lasting (chronic) lung disease and any of the following: ? Wheezing. ? Long-lasting cough. ? Coughing up blood. ? A change in your usual mucus.  You have a stiff neck.  You have  changes in your: ? Vision. ? Hearing. ? Thinking. ? Mood. This information is not intended to replace advice given to you by your health care provider. Make sure you discuss any questions you have with your health care provider. Document Released: 03/01/2008 Document Revised: 05/16/2016 Document Reviewed: 12/19/2013 Elsevier Interactive Patient Education  2018 Elsevier Inc.  

## 2017-07-07 ENCOUNTER — Other Ambulatory Visit: Payer: Self-pay | Admitting: Family Medicine

## 2017-07-07 DIAGNOSIS — J449 Chronic obstructive pulmonary disease, unspecified: Secondary | ICD-10-CM

## 2017-07-16 ENCOUNTER — Ambulatory Visit (INDEPENDENT_AMBULATORY_CARE_PROVIDER_SITE_OTHER): Payer: Medicare Other

## 2017-07-16 DIAGNOSIS — Z23 Encounter for immunization: Secondary | ICD-10-CM | POA: Diagnosis not present

## 2017-08-02 DIAGNOSIS — E1122 Type 2 diabetes mellitus with diabetic chronic kidney disease: Secondary | ICD-10-CM | POA: Diagnosis not present

## 2017-08-02 DIAGNOSIS — Z794 Long term (current) use of insulin: Secondary | ICD-10-CM | POA: Diagnosis not present

## 2017-08-02 DIAGNOSIS — N183 Chronic kidney disease, stage 3 (moderate): Secondary | ICD-10-CM | POA: Diagnosis not present

## 2017-08-04 ENCOUNTER — Other Ambulatory Visit: Payer: Self-pay | Admitting: Family Medicine

## 2017-08-09 DIAGNOSIS — Z794 Long term (current) use of insulin: Secondary | ICD-10-CM | POA: Diagnosis not present

## 2017-08-09 DIAGNOSIS — N183 Chronic kidney disease, stage 3 (moderate): Secondary | ICD-10-CM | POA: Diagnosis not present

## 2017-08-09 DIAGNOSIS — E1122 Type 2 diabetes mellitus with diabetic chronic kidney disease: Secondary | ICD-10-CM | POA: Diagnosis not present

## 2017-08-16 ENCOUNTER — Encounter (INDEPENDENT_AMBULATORY_CARE_PROVIDER_SITE_OTHER): Payer: Self-pay

## 2017-08-16 ENCOUNTER — Ambulatory Visit (INDEPENDENT_AMBULATORY_CARE_PROVIDER_SITE_OTHER): Payer: Medicare Other | Admitting: Vascular Surgery

## 2017-08-16 ENCOUNTER — Encounter (INDEPENDENT_AMBULATORY_CARE_PROVIDER_SITE_OTHER): Payer: Self-pay | Admitting: Vascular Surgery

## 2017-08-16 VITALS — BP 173/83 | HR 77 | Resp 16 | Wt 145.6 lb

## 2017-08-16 DIAGNOSIS — I10 Essential (primary) hypertension: Secondary | ICD-10-CM | POA: Diagnosis not present

## 2017-08-16 DIAGNOSIS — E11649 Type 2 diabetes mellitus with hypoglycemia without coma: Secondary | ICD-10-CM

## 2017-08-16 DIAGNOSIS — I739 Peripheral vascular disease, unspecified: Secondary | ICD-10-CM | POA: Diagnosis not present

## 2017-08-16 DIAGNOSIS — I6523 Occlusion and stenosis of bilateral carotid arteries: Secondary | ICD-10-CM | POA: Diagnosis not present

## 2017-08-16 NOTE — Assessment & Plan Note (Signed)
She missed her recent carotid duplex ultrasound.  Although she does not have any recent focal symptoms, we will plan to recheck that in the near future at her convenience.  Continue current medical regimen.

## 2017-08-16 NOTE — Patient Instructions (Signed)

## 2017-08-16 NOTE — Progress Notes (Signed)
MRN : [id number]  [name] is a 83 y.o. (April 25, 1929) female who presents with chief complaint of  Chief Complaint  Patient presents with  . Leg Pain  .  History of Present Illness: Patient returns today in follow up of leg pain.  She says this has been significantly worsening last several weeks.  She has a long history of peripheral arterial disease and has had previous interventions.  She was checked at the first of the year  was found to be normal at that time.  She says when she gets up from a sitting position or tries to walk any distance her legs start to cramp and burn.  This is predominantly in the calf and knee area.  Both legs are affected.  No new ulceration or infection. Also, the patient reports that she missed her recent carotid ultrasound which was scheduled.  She has not had any focal neurologic symptoms to her knowledge. Specifically, the patient denies amaurosis fugax, speech or swallowing difficulties, or arm or leg weakness or numbness   Current Outpatient Medications  Medication Sig Dispense Refill  . albuterol (PROVENTIL HFA;VENTOLIN HFA) 108 (90 Base) MCG/ACT inhaler Inhale 2 puffs into the lungs every 4 (four) hours as needed for wheezing or shortness of breath. 1 Inhaler 6  . Ascorbic Acid (VITAMIN C) 1000 MG tablet Take 1,000 mg by mouth daily.     Marland Kitchen aspirin 325 MG tablet Take 325 mg by mouth daily. Reported on 01/05/2016    . ASSURE COMFORT LANCETS 30G MISC     . benzonatate (TESSALON) 100 MG capsule Take 100 mg by mouth 3 (three) times daily as needed for cough.    . benzonatate (TESSALON) 100 MG capsule TAKE 1 CAPSULE BY MOUTH 3 TIMES DAILY ASNEEDED FOR COUGH 90 capsule 2  . Blood Glucose Calibration (ACCU-CHEK SMARTVIEW CONTROL) LIQD     . cetirizine (ZYRTEC) 10 MG tablet TAKE 1 TABLET BY MOUTH DAILY 90 tablet 3  . diltiazem (DILACOR XR) 180 MG 24 hr capsule Take 180 mg by mouth daily.    Marland Kitchen donepezil (ARICEPT) 5 MG tablet Take 1 tablet by mouth  daily.    Marland Kitchen ezetimibe (ZETIA) 10 MG tablet Take 1 tablet (10 mg total) by mouth daily. 30 tablet 12  . FLUoxetine (PROZAC) 40 MG capsule TAKE 1 CAPSULE BY MOUTH EVERY DAY 30 capsule 12  . fluticasone (FLOVENT HFA) 220 MCG/ACT inhaler Inhale 2 puffs into the lungs 2 (two) times daily. 1 Inhaler 6  . glucose blood test strip ACCU-CHEK AVIVA PLUS TEST STRP Use to check sugars 3 times daily.    . insulin glargine (LANTUS) 100 UNIT/ML injection Inject 0.05 mLs (5 Units total) into the skin at bedtime. (Patient taking differently: Inject 10 Units into the skin at bedtime. ) 10 mL 11  . insulin lispro (HUMALOG KWIKPEN) 100 UNIT/ML KiwkPen Inject into the skin. 12 units in the am, 10 units at lunch and 12 units in the pm    . losartan (COZAAR) 100 MG tablet Take 1 tablet (100 mg total) by mouth daily. 90 tablet 3  . Magnesium 400 MG CAPS Take by mouth daily.    . montelukast (SINGULAIR) 10 MG tablet TAKE ONE TABLET BY MOUTH AT BEDTIME 90 tablet 3  . Multiple Vitamins-Minerals (ICAPS AREDS 2 PO) Take by mouth.    . pantoprazole (PROTONIX) 40 MG tablet TAKE ONE TABLET BY MOUTH EVERY DAY 90 tablet 3  . potassium chloride (K-DUR) 10 MEQ tablet  TAKE ONE TABLET BY MOUTH TWICE DAILY 180 tablet 3  . SURE COMFORT PEN NEEDLES 31G X 5 MM MISC     . traZODone (DESYREL) 50 MG tablet TAKE ONE AND A HALF TABLETS AT BEDTIME AS NEEDED FOR SLEEP 45 tablet 11  . vitamin E 1000 UNIT capsule Take 1,000 Units by mouth daily.    Marland Kitchen azithromycin (ZITHROMAX) 250 MG tablet Take 2 pills on day 1, then one pill on each of the following four days. (Patient not taking: Reported on 08/16/2017) 6 tablet 0   No current facility-administered medications for this visit.     Past Medical History:  Diagnosis Date  . Angina pectoris ([place])   . Atrial fibrillation ([place])   . Cervicalgia   . Chronic back pain   . Chronic obstructive pulmonary disease (COPD) ([place])   . COPD (chronic obstructive pulmonary disease) ([place])   . DDD  (degenerative disc disease), lumbar   . Diabetes mellitus without complication ([place])   . Dysphagia   . Hyperlipemia   . Hypertension   . Vulvovaginitis     Past Surgical History:  Procedure Laterality Date  . gall bladder    . melanoma     removal neck and back  . PERIPHERAL VASCULAR CATHETERIZATION Left 01/05/2016   Procedure: Lower Extremity Angiography;  Surgeon: Algernon Huxley, MD;  Location: [facility];  Service: Cardiovascular;  Laterality: Left;  . PERIPHERAL VASCULAR CATHETERIZATION  01/05/2016   Procedure: Lower Extremity Intervention;  Surgeon: Algernon Huxley, MD;  Location: [facility];  Service: Cardiovascular;;  . ureterolithiasis     calculus removed  . VAGINAL HYSTERECTOMY    . VULVA / PERINEUM BIOPSY  05/29/2015    Social History Social History   Tobacco Use  . Smoking status: Never Smoker  . Smokeless tobacco: Never Used  Substance Use Topics  . Alcohol use: No  . Drug use: No     Family History Family History  Problem Relation Age of Onset  . Ovarian cancer Other   . Diabetes Sister   . Colon cancer Brother   . Diabetes Brother   . Breast cancer Neg Hx      Allergies  Allergen Reactions  . Neomycin-Bacitracin Zn-Polymyx Swelling  . Benzalkonium Chloride Itching and Swelling  . Ibuprofen     Other reaction(s): Dizziness Heart fluttering. Tachycardia. Tachycardia.  . Valdecoxib Nausea And Vomiting  . Albuterol Rash  . Latex Rash  . Tape Rash    blisters  . Triamcinolone Rash     REVIEW OF SYSTEMS(Negative unless checked)  Constitutional: [] Weight loss[] Fever[] Chills Cardiac:[] Chest pain[] Chest pressure[] Palpitations [] Shortness of breath when laying flat [] Shortness of breath at rest [] Shortness of breath with exertion. Vascular: [x] Pain in legs with walking[] Pain in legsat rest[] Pain in legs when laying flat [] Claudication [] Pain in feet when walking [] Pain in feet at rest [] Pain in feet  when laying flat [] History of DVT [] Phlebitis [] Swelling in legs [] Varicose veins [] Non-healing ulcers Pulmonary: [] Uses home oxygen [] Productive cough[] Hemoptysis [] Wheeze [] COPD [] Asthma Neurologic: [x] Dizziness [] Blackouts [] Seizures [] History of stroke [] History of TIA[] Aphasia [] Temporary blindness[] Dysphagia [] Weaknessor numbness in arms [] Weakness or numbnessin legs Musculoskeletal: [x] Arthritis [] Joint swelling [x] Joint pain [] Low back pain Hematologic:[x] Easy bruising[] Easy bleeding [] Hypercoagulable state [] Anemic  Gastrointestinal:[] Blood in stool[] Vomiting blood[] Gastroesophageal reflux/heartburn[] Abdominal pain Genitourinary: [] Chronic kidney disease [] Difficulturination [] Frequenturination [] Burning with urination[] Hematuria Skin: [] Rashes [] Ulcers [] Wounds Psychological: [] History of anxiety[] History of major depression.      Physical Examination  BP (!) 173/83 (BP Location: Left Arm)   Pulse 77  Resp 16   Wt 66 kg (145 lb 9.6 oz)   BMI 24.99 kg/m  Gen:  WD/WN, NAD Head: [place]/AT, No temporalis wasting. Ear/Nose/Throat: Hearing grossly intact, nares w/o erythema or drainage, trachea midline Eyes: Conjunctiva clear. Sclera non-icteric Neck: Supple.  No JVD.  No carotid bruits heard today Pulmonary:  Good air movement, no use of accessory muscles.  Cardiac: Irregular Vascular:  Vessel Right Left  Radial Palpable Palpable                          PT  1+ palpable  1+ palpable  DP  1+ palpable  1+ palpable    Musculoskeletal: M/S 5/5 throughout.  No deformity or atrophy.  No significant lower extremity edema.  Walks with a cane Neurologic: Sensation grossly intact in extremities.  Symmetrical.  Speech is fluent.  Psychiatric: Judgment intact, Mood & affect appropriate for pt's clinical situation. Dermatologic: No rashes or ulcers noted.  No cellulitis or open wounds. Lymph :  No Cervical, Axillary, or Inguinal lymphadenopathy.      Labs Recent Results (from the past 2160 hour(s))  CBC with Differential/Platelet     Status: Abnormal   Collection Time: 06/07/17  3:59 PM  Result Value Ref Range   WBC 8.7 3.8 - 10.8 Thousand/uL   RBC 4.71 3.80 - 5.10 Million/uL   Hemoglobin 12.4 11.7 - 15.5 g/dL   HCT 39.0 35.0 - 45.0 %   MCV 82.8 80.0 - 100.0 fL   MCH 26.3 (L) 27.0 - 33.0 pg   MCHC 31.8 (L) 32.0 - 36.0 g/dL   RDW 13.6 11.0 - 15.0 %   Platelets 268 140 - 400 Thousand/uL   MPV 10.9 7.5 - 12.5 fL   Neutro Abs 4,263 1,500 - 7,800 cells/uL   Lymphs Abs 3,080 850 - 3,900 cells/uL   WBC mixed population 931 200 - 950 cells/uL   Eosinophils Absolute 348 15 - 500 cells/uL   Basophils Absolute 78 0 - 200 cells/uL   Neutrophils Relative % 49 %   Total Lymphocyte 35.4 %   Monocytes Relative 10.7 %   Eosinophils Relative 4.0 %   Basophils Relative 0.9 %  Lipid Profile     Status: Abnormal   Collection Time: 06/07/17  3:59 PM  Result Value Ref Range   Cholesterol 226 (H) <200 mg/dL   HDL 49 (L) >50 mg/dL   Triglycerides 367 (H) <150 mg/dL   LDL Cholesterol (Calc) 125 (H) mg/dL (calc)    Comment: Reference range: <100 . Desirable range <100 mg/dL for primary prevention;   <70 mg/dL for patients with CHD or diabetic patients  with > or = 2 CHD risk factors. Marland Kitchen LDL-C is now calculated using the Martin-Hopkins  calculation, which is a validated novel method providing  better accuracy than the Friedewald equation in the  estimation of LDL-C.  Cresenciano Genre et al. Annamaria Helling. 8546;270(35): 2061-2068  (http://education.[employer].com/faq/FAQ164)    Total CHOL/HDL Ratio 4.6 <5.0 (calc)   Non-HDL Cholesterol (Calc) 177 (H) <130 mg/dL (calc)    Comment: For patients with diabetes plus 1 major ASCVD risk  factor, treating to a non-HDL-C goal of <100 mg/dL  (LDL-C of <70 mg/dL) is considered a therapeutic  option.   TSH     Status: None   Collection Time:  06/07/17  3:59 PM  Result Value Ref Range   TSH 2.08 0.40 - 4.50 mIU/L  COMPLETE METABOLIC PANEL WITH GFR     Status:  Abnormal   Collection Time: 06/07/17  3:59 PM  Result Value Ref Range   Glucose, Bld 163 (H) 65 - 139 mg/dL    Comment: .        Non-fasting reference interval .    BUN 19 7 - 25 mg/dL   Creat 1.19 (H) 0.60 - 0.88 mg/dL    Comment: For patients >29 years of age, the reference limit for Creatinine is approximately 13% higher for people identified as African-American. .    GFR, Est Non African American 41 (L) > OR = 60 mL/min/1.17m2   GFR, Est African American 48 (L) > OR = 60 mL/min/1.48m2   BUN/Creatinine Ratio 16 6 - 22 (calc)   Sodium 135 135 - 146 mmol/L   Potassium 3.8 3.5 - 5.3 mmol/L   Chloride 96 (L) 98 - 110 mmol/L   CO2 30 20 - 32 mmol/L   Calcium 9.1 8.6 - 10.4 mg/dL   Total Protein 6.7 6.1 - 8.1 g/dL   Albumin 3.9 3.6 - 5.1 g/dL   Globulin 2.8 1.9 - 3.7 g/dL (calc)   AG Ratio 1.4 1.0 - 2.5 (calc)   Total Bilirubin 0.4 0.2 - 1.2 mg/dL   Alkaline phosphatase (APISO) 95 33 - 130 U/L   AST 20 10 - 35 U/L   ALT 15 6 - 29 U/L    Radiology No results found.    Assessment/Plan  Type 2 diabetes mellitus (HCC) blood glucose control important in reducing the progression of atherosclerotic disease. Also, involved in wound healing. On appropriate medications.   Benign essential HTN blood pressure control important in reducing the progression of atherosclerotic disease. On appropriate oral medications.   Carotid artery narrowing She missed her recent carotid duplex ultrasound.  Although she does not have any recent focal symptoms, we will plan to recheck that in the near future at her convenience.  Continue current medical regimen.  PVD (peripheral vascular disease) ([place]) Patient has a long history of peripheral arterial disease.  Although her recent symptoms are not entirely classic for arterial insufficiency, given her long history and her  advanced age with multiple medical comorbidities I think this has to be considered.  Other options include neuropathy and arthritis we will obtain ABIs at her convenience in the near future.  We will see the patient back following the studies.    Leotis Pain, MD  08/16/2017 3:49 PM    This note was created with Dragon medical transcription system.  Any errors from dictation are purely unintentional

## 2017-08-16 NOTE — Assessment & Plan Note (Signed)
Patient has a long history of peripheral arterial disease.  Although her recent symptoms are not entirely classic for arterial insufficiency, given her long history and her advanced age with multiple medical comorbidities I think this has to be considered.  Other options include neuropathy and arthritis we will obtain ABIs at her convenience in the near future.  We will see the patient back following the studies.

## 2017-08-29 ENCOUNTER — Telehealth: Payer: Self-pay | Admitting: Family Medicine

## 2017-08-29 DIAGNOSIS — K219 Gastro-esophageal reflux disease without esophagitis: Secondary | ICD-10-CM

## 2017-08-29 NOTE — Telephone Encounter (Signed)
Pt stated that she has been taking pantoprazole (PROTONIX) 40 MG tablet and it isn't helping and she would like something else sent to [facility]. Please advise. Thanks TNP

## 2017-08-30 MED ORDER — LANSOPRAZOLE 30 MG PO CPDR: 30.0000 mg | DELAYED_RELEASE_CAPSULE | Freq: Every day | ORAL | 3 refills | Status: DC

## 2017-08-30 NOTE — Telephone Encounter (Signed)
Rx sent in as below, updated medication list in the chart. Pt advised-[name], RMA

## 2017-08-30 NOTE — Telephone Encounter (Signed)
Lansoprazole 30mg  q am.

## 2017-09-07 ENCOUNTER — Ambulatory Visit (INDEPENDENT_AMBULATORY_CARE_PROVIDER_SITE_OTHER): Payer: Medicare Other | Admitting: Family Medicine

## 2017-09-07 VITALS — BP 150/78 | HR 80 | Temp 97.8°F | Resp 16 | Wt 144.0 lb

## 2017-09-07 DIAGNOSIS — G3183 Dementia with Lewy bodies: Secondary | ICD-10-CM | POA: Diagnosis not present

## 2017-09-07 DIAGNOSIS — E7800 Pure hypercholesterolemia, unspecified: Secondary | ICD-10-CM | POA: Diagnosis not present

## 2017-09-07 DIAGNOSIS — I480 Paroxysmal atrial fibrillation: Secondary | ICD-10-CM

## 2017-09-07 DIAGNOSIS — I10 Essential (primary) hypertension: Secondary | ICD-10-CM

## 2017-09-07 DIAGNOSIS — E11649 Type 2 diabetes mellitus with hypoglycemia without coma: Secondary | ICD-10-CM

## 2017-09-07 DIAGNOSIS — F0280 Dementia in other diseases classified elsewhere without behavioral disturbance: Secondary | ICD-10-CM

## 2017-09-07 MED ORDER — DILTIAZEM HCL ER COATED BEADS 240 MG PO CP24: 240.0000 mg | ORAL_CAPSULE | Freq: Every day | ORAL | 3 refills | Status: DC

## 2017-09-07 NOTE — Progress Notes (Signed)
EVANIE BUCKLE  MRN: [id number] DOB: [birth year]  Subjective:  HPI   The patient is an 83 year old female who presents for 3 month follow up of chronic disease.  She was last seen on 06/23/17 for an acute visit with one of PA's in the office.  She was last seen on 06/07/17 for chronic issues.   She is followed by Dr. Gabriel Carina for her Diabetes and Dr. Manuella Ghazi for her Lewy Body Dementia.  Hypertension-On her last visit this was much improved and she was instructed to continue her medications.  She does not check her blood pressure outside of the office.  She did have her pressure checked about 3 weeks ago when she saw Dr Lucky Cowboy and reports that it was 173/80.  Hyperlipidemia and Statin intolerance-Patient had labs done on her last visit and was asked to consider Zetia due to her statin intolerance. A prescription was then sent to her pharmacy.  Patient states that she seems to be tolerating the medication well.  She did have some indigestion after starting the medicine  Patient Active Problem List   Diagnosis Date Noted  . PVD (peripheral vascular disease) ([place]) 08/16/2017  . Pain in limb 07/27/2016  . Hallucinations 07/07/2016  . Chest pain 07/01/2016  . Hypokalemia 09/03/2015  . Insomnia 07/23/2015  . Headache 07/23/2015  . Type 2 diabetes mellitus ([place]) 06/24/2015  . Chronic vulvitis 06/02/2015  . Allergic rhinitis 05/30/2015  . Absolute anemia 05/30/2015  . Back ache 05/30/2015  . Body mass index (BMI) of 29.0-29.9 in adult 05/30/2015  . Edema extremities 05/30/2015  . Dilatation of esophagus 05/30/2015  . Fall 05/30/2015  . H/O deep venous thrombosis 05/30/2015  . Hypercholesteremia 05/30/2015  . Heart & renal disease, hypertensive, with heart failure ([place]) 05/30/2015  . Below normal amount of sodium in the blood 05/30/2015  . Calculus of kidney 05/30/2015  . Gonalgia 05/30/2015  . Psoriasis 05/30/2015  . Avitaminosis D 05/30/2015  . Cough 04/01/2015  . Combined fat and  carbohydrate induced hyperlipemia 12/30/2014  . Paroxysmal atrial fibrillation ([place]) 07/10/2014  . Abnormal gait 07/08/2014  . Anxiety state 07/08/2014  . Chronic kidney disease 07/08/2014  . Acid reflux 07/08/2014  . 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[place]) 07/08/2014  . Chronic obstructive pulmonary disease ([place]) 07/08/2014  . Benign essential HTN 06/26/2014  . Carotid artery narrowing 06/26/2014  . Intermittent claudication ([place]) 06/26/2014  . Basal cell carcinoma of face 05/01/2014  . Disease of female genital organs 11/28/2012  . Incomplete bladder emptying 09/05/2012  . Excessive urination at night 08/15/2012  . Basal cell carcinoma of ear 10/26/2011    Past Medical History:  Diagnosis Date  . Angina pectoris ([place])   . Atrial fibrillation ([place])   . Cervicalgia   . Chronic back pain   . Chronic obstructive pulmonary disease (COPD) ([place])   . COPD (chronic obstructive pulmonary disease) ([place])   . DDD (degenerative disc disease), lumbar   . Diabetes mellitus without complication ([place])   . Dysphagia   . Hyperlipemia   . Hypertension   . Vulvovaginitis     Social History   Socioeconomic History  . Marital status: Widowed    Spouse name: Not on file  . Number of children: Not on file  . Years of education: Not on file  . Highest education level: Not on file  Social Needs  . Financial resource strain: Not on file  . Food insecurity - worry: Not on file  . Food insecurity -  inability: Not on file  . Transportation needs - medical: Not on file  . Transportation needs - non-medical: Not on file  Occupational History  . Not on file  Tobacco Use  . Smoking status: Never Smoker  . Smokeless tobacco: Never Used  Substance and Sexual Activity  . Alcohol use: No  . Drug use: No  . Sexual activity: No  Other Topics Concern  . Not on file  Social History Narrative  . Not on file    Outpatient Encounter Medications as of 09/07/2017  Medication Sig  . albuterol (PROVENTIL  HFA;VENTOLIN HFA) 108 (90 Base) MCG/ACT inhaler Inhale 2 puffs into the lungs every 4 (four) hours as needed for wheezing or shortness of breath.  . Ascorbic Acid (VITAMIN C) 1000 MG tablet Take 1,000 mg by mouth daily.   Marland Kitchen aspirin 325 MG tablet Take 325 mg by mouth daily. Reported on 01/05/2016  . ASSURE COMFORT LANCETS 30G MISC   . benzonatate (TESSALON) 100 MG capsule TAKE 1 CAPSULE BY MOUTH 3 TIMES DAILY ASNEEDED FOR COUGH  . Blood Glucose Calibration (ACCU-CHEK SMARTVIEW CONTROL) LIQD   . cetirizine (ZYRTEC) 10 MG tablet TAKE 1 TABLET BY MOUTH DAILY  . diltiazem (DILACOR XR) 180 MG 24 hr capsule Take 180 mg by mouth daily.  Marland Kitchen donepezil (ARICEPT) 5 MG tablet Take 1 tablet by mouth daily.  Marland Kitchen ezetimibe (ZETIA) 10 MG tablet Take 1 tablet (10 mg total) by mouth daily.  Marland Kitchen FLUoxetine (PROZAC) 40 MG capsule TAKE 1 CAPSULE BY MOUTH EVERY DAY  . fluticasone (FLOVENT HFA) 220 MCG/ACT inhaler Inhale 2 puffs into the lungs 2 (two) times daily.  Marland Kitchen glucose blood test strip ACCU-CHEK AVIVA PLUS TEST STRP Use to check sugars 3 times daily.  . insulin glargine (LANTUS) 100 UNIT/ML injection Inject 0.05 mLs (5 Units total) into the skin at bedtime. (Patient taking differently: Inject 10 Units into the skin at bedtime. )  . insulin lispro (HUMALOG KWIKPEN) 100 UNIT/ML KiwkPen Inject into the skin. 12 units in the am, 10 units at lunch and 12 units in the pm  . lansoprazole (PREVACID) 30 MG capsule Take 1 capsule (30 mg total) by mouth daily at 12 noon.  Marland Kitchen losartan (COZAAR) 100 MG tablet Take 1 tablet (100 mg total) by mouth daily.  . Magnesium 400 MG CAPS Take by mouth daily.  . montelukast (SINGULAIR) 10 MG tablet TAKE ONE TABLET BY MOUTH AT BEDTIME  . Multiple Vitamins-Minerals (ICAPS AREDS 2 PO) Take by mouth.  . potassium chloride (K-DUR) 10 MEQ tablet TAKE ONE TABLET BY MOUTH TWICE DAILY  . SURE COMFORT PEN NEEDLES 31G X 5 MM MISC   . traZODone (DESYREL) 50 MG tablet TAKE ONE AND A HALF TABLETS AT BEDTIME  AS NEEDED FOR SLEEP  . vitamin E 1000 UNIT capsule Take 1,000 Units by mouth daily.  . [DISCONTINUED] azithromycin (ZITHROMAX) 250 MG tablet Take 2 pills on day 1, then one pill on each of the following four days. (Patient not taking: Reported on 08/16/2017)  . [DISCONTINUED] benzonatate (TESSALON) 100 MG capsule Take 100 mg by mouth 3 (three) times daily as needed for cough.   No facility-administered encounter medications on file as of 09/07/2017.     Allergies  Allergen Reactions  . Neomycin-Bacitracin Zn-Polymyx Swelling  . Benzalkonium Chloride Itching and Swelling  . Ibuprofen     Other reaction(s): Dizziness Heart fluttering. Tachycardia. Tachycardia.  . Valdecoxib Nausea And Vomiting  . Albuterol Rash  . Latex Rash  .  Tape Rash    blisters  . Triamcinolone Rash    Review of Systems  Constitutional: Negative for fever and malaise/fatigue.  Eyes: Negative.   Respiratory: Positive for cough and shortness of breath (chronic an dunchanged). Negative for wheezing.   Cardiovascular: Positive for claudication (Being seen by Dr Lucky Cowboy). Negative for chest pain, palpitations, orthopnea and leg swelling.  Neurological: Positive for weakness.  Endo/Heme/Allergies: Negative.   Psychiatric/Behavioral: Negative.     Objective:  BP (!) 150/78 (BP Location: Right Arm, Patient Position: Sitting, Cuff Size: Normal)   Pulse 80   Temp 97.8 F (36.6 C) (Oral)   Resp 16   Wt 144 lb (65.3 kg)   BMI 24.72 kg/m   Physical Exam  Constitutional: She is oriented to person, place, and time and well-developed, well-nourished, and in no distress.  HENT:  Head: Normocephalic and atraumatic.  Right Ear: External ear normal.  Left Ear: External ear normal.  Nose: Nose normal.  Eyes: Conjunctivae are normal. Pupils are equal, round, and reactive to light.  Neck: Normal range of motion.  Cardiovascular: Normal rate, regular rhythm and normal heart sounds.  Pulmonary/Chest: Effort normal and  breath sounds normal.  Abdominal: Soft.  Neurological: She is alert and oriented to person, place, and time. Gait normal. GCS score is 15.  Skin: Skin is warm and dry.  Very fair skin.  Psychiatric: Mood, memory, affect and judgment normal.    Assessment and Plan :  HTN Increase Cardizem to 240mg  daily TIIDM HLD On Zetia--statin intolerant. Lewy Body Dementia Afib  I have done the exam and reviewed the chart and it is accurate to the best of my knowledge. Development worker, community has been used and  any errors in dictation or transcription are unintentional. Miguel Aschoff M.D. [facility]

## 2017-09-12 ENCOUNTER — Other Ambulatory Visit: Payer: Self-pay | Admitting: Family Medicine

## 2017-09-12 DIAGNOSIS — J3089 Other allergic rhinitis: Secondary | ICD-10-CM

## 2017-09-13 ENCOUNTER — Encounter (INDEPENDENT_AMBULATORY_CARE_PROVIDER_SITE_OTHER): Payer: Medicare Other

## 2017-09-13 ENCOUNTER — Ambulatory Visit (INDEPENDENT_AMBULATORY_CARE_PROVIDER_SITE_OTHER): Payer: Medicare Other | Admitting: Vascular Surgery

## 2017-09-13 DIAGNOSIS — E7800 Pure hypercholesterolemia, unspecified: Secondary | ICD-10-CM | POA: Diagnosis not present

## 2017-09-13 LAB — LIPID PANEL
CHOL/HDL RATIO: 3.7 (calc) (ref <5.0)
CHOLESTEROL: 192 mg/dL (ref <200)
HDL: 52 mg/dL (ref >50)
LDL Cholesterol (Calc): 104 mg/dL (calc) — ABNORMAL HIGH
NON-HDL CHOLESTEROL (CALC): 140 mg/dL — AB (ref <130)
Triglycerides: 252 mg/dL — ABNORMAL HIGH (ref <150)

## 2017-09-16 ENCOUNTER — Telehealth: Payer: Self-pay

## 2017-09-16 NOTE — Telephone Encounter (Signed)
Patient states that she was seen by neurologist 05/2016 (looks like we sent her over there) and patient was started on Aricept and medication needs to be refilled but neurologist will not refill since she has not been back since 2017 and patient states she was not even aware that she was taking this medication. She wants to know your opinion on whether she should be taking this medication. She states her husband was on this but he had severe dementia and she does not. -Kris Mouton, RMA

## 2017-09-22 MED ORDER — DONEPEZIL HCL 5 MG PO TABS
5.0000 mg | ORAL_TABLET | Freq: Every day | ORAL | 12 refills | Status: DC
Start: 2017-09-22 — End: 2017-10-13

## 2017-09-22 NOTE — Telephone Encounter (Signed)
Pt advised, RX refilled. Patient has appointment with [place] in march to follow [name], RMA

## 2017-09-22 NOTE — Telephone Encounter (Signed)
This is to slow down progression of dementia. Worth taking if no side effects.

## 2017-10-05 ENCOUNTER — Encounter: Payer: Self-pay | Admitting: Family Medicine

## 2017-10-10 ENCOUNTER — Telehealth: Payer: Self-pay | Admitting: Family Medicine

## 2017-10-10 NOTE — Telephone Encounter (Signed)
Stop and see if it goes away--probably will.

## 2017-10-10 NOTE — Telephone Encounter (Signed)
Patient was started back on Aricept on 09/22/17.  She states she has had diarrhea for the last 2 weeks and thinks it is coming from the medicine.  Please advise.

## 2017-10-10 NOTE — Telephone Encounter (Signed)
Advised  ED 

## 2017-10-10 NOTE — Telephone Encounter (Signed)
Pt is requesting a CMA return her call. Pt stated that she would like to discuss some things. Pt wouldn't go into detail. Please advise. Thanks TNP

## 2017-10-13 ENCOUNTER — Other Ambulatory Visit: Payer: Self-pay

## 2017-10-13 ENCOUNTER — Telehealth: Payer: Self-pay | Admitting: Family Medicine

## 2017-10-13 MED ORDER — MEMANTINE HCL 5 MG PO TABS: 5.0000 mg | ORAL_TABLET | Freq: Two times a day (BID) | ORAL | 12 refills | Status: DC

## 2017-10-13 NOTE — Telephone Encounter (Signed)
[name] with [facility] is requesting call back to discuss the Rx for memantine (NAMENDA) 5 MG tablet. [name] stated the way the Rx was written it is only a 15 day supply and pt doesn't want to pay a co-pay for this medication every 15 days. [name] is asking it she can change the Qty from 30 to 60 tablets so it will last pt a month. Please advise. Thanks TNP

## 2017-10-13 NOTE — Telephone Encounter (Signed)
Daughter Thayer Headings advised. Aricept taking out of chart and sent in Namenda 5 mg sent in-Kaia [place] Estell Harpin, RMA

## 2017-10-13 NOTE — Telephone Encounter (Signed)
Advised patient should be on 5mg  BID, changed to 60 pills.

## 2017-10-13 NOTE — Telephone Encounter (Signed)
Namenda at low dose.

## 2017-10-13 NOTE — Telephone Encounter (Signed)
Please advise on any other medication-Anastasiya V Hopkins, RMA

## 2017-10-13 NOTE — Telephone Encounter (Signed)
Pt stated that she was advised to call and give an update on how she has been since stopping the Aricept. Pt stated that her first day without taking the Aricept was 10/11/17 since then she has had less diarrhea. Pt stated that she feels if she stays off the medication her diarrhea will stop completely. Pt request a call back to discuss if there is another medication she could try since she believes the Aricept is causing the diarrhea. Please advise. Thanks TNP

## 2017-10-18 ENCOUNTER — Ambulatory Visit (INDEPENDENT_AMBULATORY_CARE_PROVIDER_SITE_OTHER): Payer: Medicare Other | Admitting: Vascular Surgery

## 2017-10-18 ENCOUNTER — Encounter (INDEPENDENT_AMBULATORY_CARE_PROVIDER_SITE_OTHER): Payer: Self-pay | Admitting: Vascular Surgery

## 2017-10-18 ENCOUNTER — Ambulatory Visit (INDEPENDENT_AMBULATORY_CARE_PROVIDER_SITE_OTHER): Payer: Medicare Other

## 2017-10-18 ENCOUNTER — Other Ambulatory Visit (INDEPENDENT_AMBULATORY_CARE_PROVIDER_SITE_OTHER): Payer: Self-pay | Admitting: Vascular Surgery

## 2017-10-18 VITALS — BP 157/73 | HR 69 | Resp 16 | Wt 146.0 lb

## 2017-10-18 DIAGNOSIS — I6523 Occlusion and stenosis of bilateral carotid arteries: Secondary | ICD-10-CM

## 2017-10-18 DIAGNOSIS — I739 Peripheral vascular disease, unspecified: Secondary | ICD-10-CM

## 2017-10-18 DIAGNOSIS — I10 Essential (primary) hypertension: Secondary | ICD-10-CM | POA: Diagnosis not present

## 2017-10-18 DIAGNOSIS — E11649 Type 2 diabetes mellitus with hypoglycemia without coma: Secondary | ICD-10-CM | POA: Diagnosis not present

## 2017-10-18 NOTE — Assessment & Plan Note (Signed)
Her noninvasive studies today showed her perfusion to be maintained with ABIs of 1.11 on the right and 1.04 on the left with biphasic waveforms and nearly normal digital pressures bilaterally. Overall, perfusion is maintained.  No role for intervention.  Pain not likely due to arterial insufficiency.  Recheck in 1 year

## 2017-10-18 NOTE — Progress Notes (Signed)
MRN : [id number]  [name] is a 84 y.o. (1928/10/28) female who presents with chief complaint of  Chief Complaint  Patient presents with  . Follow-up    abi,carotid ultrasound   .  History of Present Illness: Patient returns today in follow up of multiple issues.  She is still bothered by fair bit of leg pain in both legs.  Her noninvasive studies today showed her perfusion to be maintained with ABIs of 1.11 on the right and 1.04 on the left with biphasic waveforms and nearly normal digital pressures bilaterally. She is also studied with carotid duplex today which shows stable, 40-59% carotid artery stenosis bilaterally.  She denies any focal neurologic symptoms. Specifically, the patient denies amaurosis fugax, speech or swallowing difficulties, or arm or leg weakness or numbness          Current Outpatient Medications  Medication Sig Dispense Refill  . albuterol (PROVENTIL HFA;VENTOLIN HFA) 108 (90 Base) MCG/ACT inhaler Inhale 2 puffs into the lungs every 4 (four) hours as needed for wheezing or shortness of breath. 1 Inhaler 6  . Ascorbic Acid (VITAMIN C) 1000 MG tablet Take 1,000 mg by mouth daily.     Marland Kitchen aspirin 325 MG tablet Take 325 mg by mouth daily. Reported on 01/05/2016    . ASSURE COMFORT LANCETS 30G MISC     . benzonatate (TESSALON) 100 MG capsule Take 100 mg by mouth 3 (three) times daily as needed for cough.    . benzonatate (TESSALON) 100 MG capsule TAKE 1 CAPSULE BY MOUTH 3 TIMES DAILY ASNEEDED FOR COUGH 90 capsule 2  . Blood Glucose Calibration (ACCU-CHEK SMARTVIEW CONTROL) LIQD     . cetirizine (ZYRTEC) 10 MG tablet TAKE 1 TABLET BY MOUTH DAILY 90 tablet 3  . diltiazem (DILACOR XR) 180 MG 24 hr capsule Take 180 mg by mouth daily.    Marland Kitchen donepezil (ARICEPT) 5 MG tablet Take 1 tablet by mouth daily.    Marland Kitchen ezetimibe (ZETIA) 10 MG tablet Take 1 tablet (10 mg total) by mouth daily. 30 tablet 12  . FLUoxetine (PROZAC) 40 MG capsule TAKE 1 CAPSULE  BY MOUTH EVERY DAY 30 capsule 12  . fluticasone (FLOVENT HFA) 220 MCG/ACT inhaler Inhale 2 puffs into the lungs 2 (two) times daily. 1 Inhaler 6  . glucose blood test strip ACCU-CHEK AVIVA PLUS TEST STRP Use to check sugars 3 times daily.    . insulin glargine (LANTUS) 100 UNIT/ML injection Inject 0.05 mLs (5 Units total) into the skin at bedtime. (Patient taking differently: Inject 10 Units into the skin at bedtime. ) 10 mL 11  . insulin lispro (HUMALOG KWIKPEN) 100 UNIT/ML KiwkPen Inject into the skin. 12 units in the am, 10 units at lunch and 12 units in the pm    . losartan (COZAAR) 100 MG tablet Take 1 tablet (100 mg total) by mouth daily. 90 tablet 3  . Magnesium 400 MG CAPS Take by mouth daily.    . montelukast (SINGULAIR) 10 MG tablet TAKE ONE TABLET BY MOUTH AT BEDTIME 90 tablet 3  . Multiple Vitamins-Minerals (ICAPS AREDS 2 PO) Take by mouth.    . pantoprazole (PROTONIX) 40 MG tablet TAKE ONE TABLET BY MOUTH EVERY DAY 90 tablet 3  . potassium chloride (K-DUR) 10 MEQ tablet TAKE ONE TABLET BY MOUTH TWICE DAILY 180 tablet 3  . SURE COMFORT PEN NEEDLES 31G X 5 MM MISC     . traZODone (DESYREL) 50 MG tablet TAKE ONE AND A  HALF TABLETS AT BEDTIME AS NEEDED FOR SLEEP 45 tablet 11  . vitamin E 1000 UNIT capsule Take 1,000 Units by mouth daily.    Marland Kitchen azithromycin (ZITHROMAX) 250 MG tablet Take 2 pills on day 1, then one pill on each of the following four days. (Patient not taking: Reported on 08/16/2017) 6 tablet 0   No current facility-administered medications for this visit.         Past Medical History:  Diagnosis Date  . Angina pectoris ([place])   . Atrial fibrillation ([place])   . Cervicalgia   . Chronic back pain   . Chronic obstructive pulmonary disease (COPD) ([place])   . COPD (chronic obstructive pulmonary disease) ([place])   . DDD (degenerative disc disease), lumbar   . Diabetes mellitus without complication ([place])   . Dysphagia   . Hyperlipemia   .  Hypertension   . Vulvovaginitis          Past Surgical History:  Procedure Laterality Date  . gall bladder    . melanoma     removal neck and back  . PERIPHERAL VASCULAR CATHETERIZATION Left 01/05/2016   Procedure: Lower Extremity Angiography;  Surgeon: Algernon Huxley, MD;  Location: [facility];  Service: Cardiovascular;  Laterality: Left;  . PERIPHERAL VASCULAR CATHETERIZATION  01/05/2016   Procedure: Lower Extremity Intervention;  Surgeon: Algernon Huxley, MD;  Location: [facility];  Service: Cardiovascular;;  . ureterolithiasis     calculus removed  . VAGINAL HYSTERECTOMY    . VULVA / PERINEUM BIOPSY  05/29/2015    Social History Social History       Tobacco Use  . Smoking status: Never Smoker  . Smokeless tobacco: Never Used  Substance Use Topics  . Alcohol use: No  . Drug use: No     Family History      Family History  Problem Relation Age of Onset  . Ovarian cancer Other   . Diabetes Sister   . Colon cancer Brother   . Diabetes Brother   . Breast cancer Neg Hx           Allergies  Allergen Reactions  . Neomycin-Bacitracin Zn-Polymyx Swelling  . Benzalkonium Chloride Itching and Swelling  . Ibuprofen     Other reaction(s): Dizziness Heart fluttering. Tachycardia. Tachycardia.  . Valdecoxib Nausea And Vomiting  . Albuterol Rash  . Latex Rash  . Tape Rash    blisters  . Triamcinolone Rash     REVIEW OF SYSTEMS(Negative unless checked)  Constitutional: [] Weight loss[] Fever[] Chills Cardiac:[] Chest pain[] Chest pressure[] Palpitations [] Shortness of breath when laying flat [] Shortness of breath at rest [] Shortness of breath with exertion. Vascular: [x] Pain in legs with walking[x] Pain in legsat rest[] Pain in legs when laying flat [] Claudication [] Pain in feet when walking [] Pain in feet at rest [] Pain in feet when laying flat [] History of DVT [] Phlebitis [] Swelling  in legs [] Varicose veins [] Non-healing ulcers Pulmonary: [] Uses home oxygen [] Productive cough[] Hemoptysis [] Wheeze [] COPD [] Asthma Neurologic: [x] Dizziness [] Blackouts [] Seizures [] History of stroke [] History of TIA[] Aphasia [] Temporary blindness[] Dysphagia [] Weaknessor numbness in arms [] Weakness or numbnessin legs Musculoskeletal: [x] Arthritis [] Joint swelling [x] Joint pain [] Low back pain Hematologic:[x] Easy bruising[] Easy bleeding [] Hypercoagulable state [] Anemic  Gastrointestinal:[] Blood in stool[] Vomiting blood[] Gastroesophageal reflux/heartburn[] Abdominal pain Genitourinary: [] Chronic kidney disease [] Difficulturination [] Frequenturination [] Burning with urination[] Hematuria Skin: [] Rashes [] Ulcers [] Wounds Psychological: [] History of anxiety[] History of major depression.      Physical Examination  BP (!) 157/73 (BP Location: Right Arm)   Pulse 69   Resp 16   Wt 66.2 kg (146 lb)  BMI 25.06 kg/m  Gen:  WD/WN, frail and elderly appearing Head: [place]/AT, No temporalis wasting. Ear/Nose/Throat: Hearing grossly intact, nares w/o erythema or drainage, trachea midline Eyes: Conjunctiva clear. Sclera non-icteric Neck: Supple.  No JVD.  Pulmonary:  Good air movement, no use of accessory muscles.  Cardiac: Irregular Vascular:  Vessel Right Left  Radial Palpable Palpable                                    Musculoskeletal: M/S 5/5 throughout.  No deformity or atrophy.  Neurologic: Sensation grossly intact in extremities.  Symmetrical.  Speech is fluent.  Psychiatric: Judgment intact, Mood & affect appropriate for pt's clinical situation. Dermatologic: No rashes or ulcers noted.  No cellulitis or open wounds.       Labs Recent Results (from the past 2160 hour(s))  Lipid panel     Status: Abnormal   Collection Time: 09/13/17  8:23 AM  Result Value Ref Range   Cholesterol 192 <200 mg/dL    HDL 52 >50 mg/dL   Triglycerides 252 (H) <150 mg/dL   LDL Cholesterol (Calc) 104 (H) mg/dL (calc)    Comment: Reference range: <100 . Desirable range <100 mg/dL for primary prevention;   <70 mg/dL for patients with CHD or diabetic patients  with > or = 2 CHD risk factors. Marland Kitchen LDL-C is now calculated using the Martin-Hopkins  calculation, which is a validated novel method providing  better accuracy than the Friedewald equation in the  estimation of LDL-C.  Cresenciano Genre et al. Annamaria Helling. 9604;540(98): 2061-2068  (http://education.Quest[employer].com/faq/FAQ164)    Total CHOL/HDL Ratio 3.7 <5.0 (calc)   Non-HDL Cholesterol (Calc) 140 (H) <130 mg/dL (calc)    Comment: For patients with diabetes plus 1 major ASCVD risk  factor, treating to a non-HDL-C goal of <100 mg/dL  (LDL-C of <70 mg/dL) is considered a therapeutic  option.     Radiology No results found.   Assessment/Plan Type 2 diabetes mellitus (HCC) blood glucose control important in reducing the progression of atherosclerotic disease. Also, involved in wound healing. On appropriate medications.   Benign essential HTN blood pressure control important in reducing the progression of atherosclerotic disease. On appropriate oral medications.   Benign essential HTN    Carotid artery narrowing She is studied with carotid duplex today which shows stable, 40-59% carotid artery stenosis bilaterally.  No role for intervention at that level.  Continue current medical regimen.  Recheck in 1 year  PVD (peripheral vascular disease) ([place]) Her noninvasive studies today showed her perfusion to be maintained with ABIs of 1.11 on the right and 1.04 on the left with biphasic waveforms and nearly normal digital pressures bilaterally. Overall, perfusion is maintained.  No role for intervention.  Pain not likely due to arterial insufficiency.  Recheck in 1 year    Leotis Pain, MD  10/18/2017 5:04 PM    This note was created with Dragon  medical transcription system.  Any errors from dictation are purely unintentional

## 2017-10-18 NOTE — Assessment & Plan Note (Signed)
She is studied with carotid duplex today which shows stable, 40-59% carotid artery stenosis bilaterally.  No role for intervention at that level.  Continue current medical regimen.  Recheck in 1 year

## 2017-10-18 NOTE — Patient Instructions (Signed)
Carotid Artery Disease The carotid arteries are arteries on both sides of the neck. They carry blood to the brain. Carotid artery disease is when the arteries get smaller (narrow) or get blocked. If these arteries get smaller or get blocked, you are more likely to have a stroke or warning stroke (transient ischemic attack). Follow these instructions at home:  Take medicines as told by your doctor. Make sure you understand all your medicine instructions. Do not stop your medicines without talking to your doctor first.  Follow your doctor's diet instructions. It is important to eat a healthy diet that includes plenty of: ? Fresh fruits. ? Vegetables. ? Lean meats.  Avoid: ? High-fat foods. ? High-sodium foods. ? Foods that are fried, overly processed, or have poor nutritional value.  Stay a healthy weight.  Stay active. Get at least 30 minutes of activity every day.  Do not smoke.  Limit alcohol use to: ? No more than 2 drinks a day for men. ? No more than 1 drink a day for women who are not pregnant.  Do not use illegal drugs.  Keep all doctor visits as told. Get help right away if:  You have sudden weakness or loss of feeling (numbness) on one side of the body, such as the face, arm, or leg.  You have sudden confusion.  You have trouble speaking (aphasia) or understanding.  You have sudden trouble seeing out of one or both eyes.  You have sudden trouble walking.  You have dizziness or feel like you might pass out (faint).  You have a loss of balance or your movements are not steady (uncoordinated).  You have a sudden, severe headache with no known cause.  You have trouble swallowing (dysphagia). Call your local emergency services (911 in [place] Do notdrive yourself to the clinic or hospital. This information is not intended to replace advice given to you by your health care provider. Make sure you discuss any questions you have with your health care  provider. Document Released: 08/30/2012 Document Revised: 02/19/2016 Document Reviewed: 03/14/2013 Elsevier Interactive Patient Education  2018 [employer].  

## 2017-11-03 ENCOUNTER — Ambulatory Visit (INDEPENDENT_AMBULATORY_CARE_PROVIDER_SITE_OTHER): Payer: Medicare Other

## 2017-11-03 ENCOUNTER — Encounter: Payer: Self-pay | Admitting: Family Medicine

## 2017-11-03 ENCOUNTER — Ambulatory Visit (INDEPENDENT_AMBULATORY_CARE_PROVIDER_SITE_OTHER): Payer: Medicare Other | Admitting: Family Medicine

## 2017-11-03 VITALS — BP 158/68 | HR 88 | Temp 99.0°F | Ht 64.0 in | Wt 147.6 lb

## 2017-11-03 VITALS — BP 158/80 | HR 88 | Temp 99.0°F | Ht 64.0 in | Wt 147.6 lb

## 2017-11-03 DIAGNOSIS — Z0000 Encounter for general adult medical examination without abnormal findings: Secondary | ICD-10-CM | POA: Diagnosis not present

## 2017-11-03 DIAGNOSIS — I10 Essential (primary) hypertension: Secondary | ICD-10-CM | POA: Diagnosis not present

## 2017-11-03 MED ORDER — DILTIAZEM HCL ER COATED BEADS 360 MG PO CP24: 360.0000 mg | ORAL_CAPSULE | Freq: Every day | ORAL | 11 refills | Status: DC

## 2017-11-03 NOTE — Progress Notes (Signed)
Patient: [name], Female    DOB: 07-04-1929, 84 y.o.   MRN: [id number] Visit Date: 11/03/2017  Today's Provider: Wilhemena Durie, MD   Chief Complaint  Patient presents with  . Annual Exam   Subjective:    Annual physical exam [name] is a 84 y.o. female who presents today for health maintenance and complete physical. She feels poorly. She reports she is not exercising. She reports she is sleeping poorly, but it is getting better.  ----------------------------------------------------------------- Colonoscopy- 01/20/09 internal hemorrhoids, diverticulosis Mammogram- 12/19/09 BMD- 12/31/14 normal   Review of Systems  Constitutional: Negative.   HENT: Positive for ear discharge, hearing loss, mouth sores and voice change.   Eyes: Positive for discharge and itching.  Respiratory: Positive for cough and shortness of breath.   Cardiovascular: Negative.   Gastrointestinal: Positive for diarrhea and rectal pain.  Endocrine: Negative.   Genitourinary: Positive for frequency and urgency.  Musculoskeletal: Positive for neck pain.  Skin: Positive for rash.  Allergic/Immunologic: Negative.   Neurological: Positive for light-headedness.  Hematological: Negative.   Psychiatric/Behavioral: Positive for sleep disturbance.    Social History      She  reports that  has never smoked. she has never used smokeless tobacco. She reports that she does not drink alcohol or use drugs.       Social History   Socioeconomic History  . Marital status: Widowed    Spouse name: None  . Number of children: 3  . Years of education: None  . Highest education level: 8th grade  Social Needs  . Financial resource strain: Not hard at all  . Food insecurity - worry: Never true  . Food insecurity - inability: Never true  . Transportation needs - medical: No  . Transportation needs - non-medical: No  Occupational History  . Occupation: retired  Tobacco Use  . Smoking  status: Never Smoker  . Smokeless tobacco: Never Used  Substance and Sexual Activity  . Alcohol use: No  . Drug use: No  . Sexual activity: No  Other Topics Concern  . None  Social History Narrative  . None    Past Medical History:  Diagnosis Date  . Angina pectoris ([place])   . Atrial fibrillation ([place])   . Cervicalgia   . Chronic back pain   . Chronic obstructive pulmonary disease (COPD) ([place])   . COPD (chronic obstructive pulmonary disease) ([place])   . DDD (degenerative disc disease), lumbar   . Diabetes mellitus without complication ([place])   . Dysphagia   . Hyperlipemia   . Hypertension   . Vulvovaginitis      Patient Active Problem List   Diagnosis Date Noted  . PVD (peripheral vascular disease) ([place]) 08/16/2017  . Pain in limb 07/27/2016  . Hallucinations 07/07/2016  . Chest pain 07/01/2016  . Hypokalemia 09/03/2015  . Insomnia 07/23/2015  . Headache 07/23/2015  . Type 2 diabetes mellitus ([place]) 06/24/2015  . Chronic vulvitis 06/02/2015  . Allergic rhinitis 05/30/2015  . Back ache 05/30/2015  . Body mass index (BMI) of 29.0-29.9 in adult 05/30/2015  . Edema extremities 05/30/2015  . Dilatation of esophagus 05/30/2015  . Fall 05/30/2015  . H/O deep venous thrombosis 05/30/2015  . Hypercholesteremia 05/30/2015  . Heart & renal disease, hypertensive, with heart failure ([place]) 05/30/2015  . Below normal amount of sodium in the blood 05/30/2015  . Calculus of kidney 05/30/2015  . Gonalgia 05/30/2015  . Psoriasis 05/30/2015  . Avitaminosis D  05/30/2015  . Cough 04/01/2015  . Combined fat and carbohydrate induced hyperlipemia 12/30/2014  . Paroxysmal atrial fibrillation ([place]) 07/10/2014  . Abnormal gait 07/08/2014  . Anxiety state 07/08/2014  . Chronic kidney disease 07/08/2014  . Acid reflux 07/08/2014  . 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[place]) 07/08/2014  . Chronic obstructive pulmonary disease ([place]) 07/08/2014  . Benign essential HTN 06/26/2014  . Carotid artery  narrowing 06/26/2014  . Intermittent claudication ([place]) 06/26/2014  . Basal cell carcinoma of face 05/01/2014  . Disease of female genital organs 11/28/2012  . Incomplete bladder emptying 09/05/2012  . Excessive urination at night 08/15/2012  . Basal cell carcinoma of ear 10/26/2011    Past Surgical History:  Procedure Laterality Date  . gall bladder    . melanoma     removal neck and back  . PERIPHERAL VASCULAR CATHETERIZATION Left 01/05/2016   Procedure: Lower Extremity Angiography;  Surgeon: Algernon Huxley, MD;  Location: [facility];  Service: Cardiovascular;  Laterality: Left;  . PERIPHERAL VASCULAR CATHETERIZATION  01/05/2016   Procedure: Lower Extremity Intervention;  Surgeon: Algernon Huxley, MD;  Location: [facility];  Service: Cardiovascular;;  . ureterolithiasis     calculus removed  . VAGINAL HYSTERECTOMY    . VULVA / PERINEUM BIOPSY  05/29/2015    Family History        Family Status  Relation Name Status  . Other neice Alive  . Sister  Alive  . Brother  Alive  . Mother  Alive  . Father  Alive  . Sister  Alive  . Sister  Alive  . Sister  Alive  . Sister  Alive  . Brother  Deceased  . Brother  Deceased  . Sister  Deceased  . Sister  Deceased  . Sister  Deceased  . Sister  Deceased  . Daughter  Alive  . Daughter  Alive  . Neg Hx  (Not Specified)        Her family history includes Atrial fibrillation in her daughter; Colon cancer in her brother; Diabetes in her brother and sister; Ovarian cancer in her other.     Allergies  Allergen Reactions  . Neomycin-Bacitracin Zn-Polymyx Swelling  . Aricept [Donepezil Hcl] Diarrhea  . Benzalkonium Chloride Itching and Swelling  . Ibuprofen     Other reaction(s): Dizziness Heart fluttering. Tachycardia. Tachycardia.  . Valdecoxib Nausea And Vomiting  . Albuterol Rash  . Latex Rash  . Tape Rash    blisters  . Triamcinolone Rash     Current Outpatient Medications:  .  albuterol (PROVENTIL  HFA;VENTOLIN HFA) 108 (90 Base) MCG/ACT inhaler, Inhale 2 puffs into the lungs every 4 (four) hours as needed for wheezing or shortness of breath., Disp: 1 Inhaler, Rfl: 6 .  Ascorbic Acid (VITAMIN C) 1000 MG tablet, Take 1,000 mg by mouth daily. , Disp: , Rfl:  .  aspirin 325 MG tablet, Take 325 mg by mouth daily. Reported on 01/05/2016, Disp: , Rfl:  .  ASSURE COMFORT LANCETS 30G MISC, , Disp: , Rfl:  .  benzonatate (TESSALON) 100 MG capsule, TAKE 1 CAPSULE BY MOUTH 3 TIMES DAILY ASNEEDED FOR COUGH, Disp: 90 capsule, Rfl: 2 .  Blood Glucose Calibration (ACCU-CHEK SMARTVIEW CONTROL) LIQD, , Disp: , Rfl:  .  cetirizine (ZYRTEC) 10 MG tablet, TAKE 1 TABLET BY MOUTH DAILY, Disp: 90 tablet, Rfl: 3 .  diltiazem (DILTIAZEM CD) 240 MG 24 hr capsule, Take 1 capsule (240 mg total) by mouth daily., Disp: 90  capsule, Rfl: 3 .  donepezil (ARICEPT) 5 MG tablet, , Disp: , Rfl:  .  ezetimibe (ZETIA) 10 MG tablet, Take 1 tablet (10 mg total) by mouth daily., Disp: 30 tablet, Rfl: 12 .  FLUoxetine (PROZAC) 40 MG capsule, TAKE 1 CAPSULE BY MOUTH EVERY DAY, Disp: 30 capsule, Rfl: 12 .  fluticasone (FLOVENT HFA) 220 MCG/ACT inhaler, Inhale 2 puffs into the lungs 2 (two) times daily., Disp: 1 Inhaler, Rfl: 6 .  glucose blood test strip, ACCU-CHEK AVIVA PLUS TEST STRP Use to check sugars 3 times daily., Disp: , Rfl:  .  insulin glargine (LANTUS) 100 UNIT/ML injection, Inject 0.05 mLs (5 Units total) into the skin at bedtime. (Patient taking differently: Inject 10 Units into the skin at bedtime. ), Disp: 10 mL, Rfl: 11 .  insulin lispro (HUMALOG KWIKPEN) 100 UNIT/ML KiwkPen, Inject into the skin. 12 units in the am, 10 units at lunch and 12 units in the pm, Disp: , Rfl:  .  lansoprazole (PREVACID) 30 MG capsule, Take 1 capsule (30 mg total) by mouth daily at 12 noon., Disp: 90 capsule, Rfl: 3 .  losartan (COZAAR) 100 MG tablet, Take 1 tablet (100 mg total) by mouth daily., Disp: 90 tablet, Rfl: 3 .  Magnesium 400 MG CAPS,  Take by mouth daily., Disp: , Rfl:  .  memantine (NAMENDA) 5 MG tablet, Take 1 tablet (5 mg total) by mouth 2 (two) times daily., Disp: 60 tablet, Rfl: 12 .  montelukast (SINGULAIR) 10 MG tablet, TAKE ONE TABLET BY MOUTH EVERY EVENING AT BEDTIME, Disp: 90 tablet, Rfl: 3 .  Multiple Vitamins-Minerals (ICAPS AREDS 2 PO), Take by mouth., Disp: , Rfl:  .  potassium chloride (K-DUR) 10 MEQ tablet, TAKE ONE TABLET BY MOUTH TWICE DAILY, Disp: 180 tablet, Rfl: 3 .  SURE COMFORT PEN NEEDLES 31G X 5 MM MISC, , Disp: , Rfl:  .  traZODone (DESYREL) 50 MG tablet, TAKE ONE AND A HALF TABLETS AT BEDTIME AS NEEDED FOR SLEEP, Disp: 45 tablet, Rfl: 11 .  vitamin E 1000 UNIT capsule, Take 1,000 Units by mouth daily., Disp: , Rfl:    Patient Care Team: Jerrol Banana., MD as PCP - General (Family Medicine) Gabriel Carina, Betsey Holiday, MD as Physician Assistant (Endocrinology) Dingeldein, Remo Lipps, MD as Consulting Physician (Ophthalmology) Algernon Huxley, MD as Referring Physician (Vascular Surgery) Corey Skains, MD as Consulting Physician (Cardiology) Ralene Bathe, MD as Consulting Physician (Dermatology)      Objective:   Vitals: BP (!) 158/68   Pulse 88   Temp 99 F (37.2 C) (Oral)   Ht 5\' 4"  (1.626 m)   Wt 147 lb 9.6 oz (67 kg)   BMI 25.34 kg/m    Vitals:   11/03/17 1009  BP: (!) 158/68  Pulse: 88  Temp: 99 F (37.2 C)  TempSrc: Oral  Weight: 147 lb 9.6 oz (67 kg)  Height: 5\' 4"  (1.626 m)     Physical Exam  Constitutional: She is oriented to person, place, and time. She appears well-developed and well-nourished.  HENT:  Head: Normocephalic and atraumatic.  Right Ear: External ear normal.  Left Ear: External ear normal.  Nose: Nose normal.  Mouth/Throat: Oropharynx is clear and moist.  Upper partial denture.  Eyes: Conjunctivae are normal. No scleral icterus.  Neck: No thyromegaly present.  Cardiovascular: Normal rate, regular rhythm and normal heart sounds.  Pulmonary/Chest: Effort  normal and breath sounds normal.  Abdominal: Soft.  Neurological: She is alert and  oriented to person, place, and time.  Skin: Skin is warm and dry.  Very fair skin.  Psychiatric: She has a normal mood and affect. Her behavior is normal. Judgment and thought content normal.     Depression Screen PHQ 2/9 Scores 11/03/2017 11/03/2017 10/07/2016 05/26/2016  PHQ - 2 Score 0 0 0 1  PHQ- 9 Score 11 - - -      Assessment & Plan:     Routine Health Maintenance and Physical Exam  Exercise Activities and Dietary recommendations Goals    . DIET - INCREASE WATER INTAKE     Recommend increasing water intake to 4 glasses a day.        Immunization History  Administered Date(s) Administered  . Influenza Split 06/27/2010, 07/07/2011, 07/11/2012  . Influenza, High Dose Seasonal PF 07/13/2014, 06/24/2015, 06/16/2016, 07/16/2017  . Influenza,inj,Quad PF,6+ Mos 06/06/2013  . Pneumococcal Conjugate-13 11/28/2014  . Pneumococcal Polysaccharide-23 08/01/1995, 11/17/2012  . Td 05/20/2005    Health Maintenance  Topic Date Due  . TETANUS/TDAP  05/21/2015  . HEMOGLOBIN A1C  01/30/2018  . OPHTHALMOLOGY EXAM  05/03/2018  . FOOT EXAM  08/09/2018  . INFLUENZA VACCINE  Completed  . DEXA SCAN  Completed  . PNA vac Low Risk Adult  Completed    No screening exams at age 64. Discussed health benefits of physical activity, and encouraged her to engage in regular exercise appropriate for her age and condition.  TIIDM Per endocrine. HTN Early Dementia.    --------------------------------------------------------------------  I have done the exam and reviewed the chart and it is accurate to the best of my knowledge. Development worker, community has been used and  any errors in dictation or transcription are unintentional. Miguel Aschoff M.D. [place], MD  [facility]

## 2017-11-03 NOTE — Patient Instructions (Addendum)
[name] , Thank you for taking time to come for your Medicare Wellness Visit. I appreciate your ongoing commitment to your health goals. Please review the following plan we discussed and let me know if I can assist you in the future.   Screening recommendations/referrals: Colonoscopy: Up to date Mammogram: Up to date Bone Density: Up to date Recommended yearly ophthalmology/optometry visit for glaucoma screening and checkup Recommended yearly dental visit for hygiene and checkup  Vaccinations: Influenza vaccine: Up to date Pneumococcal vaccine: Up to date Tdap vaccine: Pt declines today.  Shingles vaccine: Pt declines today.     Advanced directives: Please bring a copy of your POA (Power of Attorney) and/or Living Will to your next appointment.   Conditions/risks identified: Recommend increasing water intake to 4 glasses a day.   Next appointment: 10:30 AM today   Preventive Care 65 Years and Older, Female Preventive care refers to lifestyle choices and visits with your health care provider that can promote health and wellness. What does preventive care include?  A yearly physical exam. This is also called an annual well check.  Dental exams once or twice a year.  Routine eye exams. Ask your health care provider how often you should have your eyes checked.  Personal lifestyle choices, including:  Daily care of your teeth and gums.  Regular physical activity.  Eating a healthy diet.  Avoiding tobacco and drug use.  Limiting alcohol use.  Practicing safe sex.  Taking low-dose aspirin every day.  Taking vitamin and mineral supplements as recommended by your health care provider. What happens during an annual well check? The services and screenings done by your health care provider during your annual well check will depend on your age, overall health, lifestyle risk factors, and family history of disease. Counseling  Your health care provider may ask you  questions about your:  Alcohol use.  Tobacco use.  Drug use.  Emotional well-being.  Home and relationship well-being.  Sexual activity.  Eating habits.  History of falls.  Memory and ability to understand (cognition).  Work and work Statistician.  Reproductive health. Screening  You may have the following tests or measurements:  Height, weight, and BMI.  Blood pressure.  Lipid and cholesterol levels. These may be checked every 5 years, or more frequently if you are over 42 years old.  Skin check.  Lung cancer screening. You may have this screening every year starting at age 75 if you have a 30-pack-year history of smoking and currently smoke or have quit within the past 15 years.  Fecal occult blood test (FOBT) of the stool. You may have this test every year starting at age 98.  Flexible sigmoidoscopy or colonoscopy. You may have a sigmoidoscopy every 5 years or a colonoscopy every 10 years starting at age 24.  Hepatitis C blood test.  Hepatitis B blood test.  Sexually transmitted disease (STD) testing.  Diabetes screening. This is done by checking your blood sugar (glucose) after you have not eaten for a while (fasting). You may have this done every 1-3 years.  Bone density scan. This is done to screen for osteoporosis. You may have this done starting at age 8.  Mammogram. This may be done every 1-2 years. Talk to your health care provider about how often you should have regular mammograms. Talk with your health care provider about your test results, treatment options, and if necessary, the need for more tests. Vaccines  Your health care provider may recommend certain vaccines, such  as:  Influenza vaccine. This is recommended every year.  Tetanus, diphtheria, and acellular pertussis (Tdap, Td) vaccine. You may need a Td booster every 10 years.  Zoster vaccine. You may need this after age 11.  Pneumococcal 13-valent conjugate (PCV13) vaccine. One dose is  recommended after age 36.  Pneumococcal polysaccharide (PPSV23) vaccine. One dose is recommended after age 64. Talk to your health care provider about which screenings and vaccines you need and how often you need them. This information is not intended to replace advice given to you by your health care provider. Make sure you discuss any questions you have with your health care provider. Document Released: 10/10/2015 Document Revised: 06/02/2016 Document Reviewed: 07/15/2015 Elsevier Interactive Patient Education  2017 [place] Prevention in the Home Falls can cause injuries. They can happen to people of all ages. There are many things you can do to make your home safe and to help prevent falls. What can I do on the outside of my home?  Regularly fix the edges of walkways and driveways and fix any cracks.  Remove anything that might make you trip as you walk through a door, such as a raised step or threshold.  Trim any bushes or trees on the path to your home.  Use bright outdoor lighting.  Clear any walking paths of anything that might make someone trip, such as rocks or tools.  Regularly check to see if handrails are loose or broken. Make sure that both sides of any steps have handrails.  Any raised decks and porches should have guardrails on the edges.  Have any leaves, snow, or ice cleared regularly.  Use sand or salt on walking paths during winter.  Clean up any spills in your garage right away. This includes oil or grease spills. What can I do in the bathroom?  Use night lights.  Install grab bars by the toilet and in the tub and shower. Do not use towel bars as grab bars.  Use non-skid mats or decals in the tub or shower.  If you need to sit down in the shower, use a plastic, non-slip stool.  Keep the floor dry. Clean up any water that spills on the floor as soon as it happens.  Remove soap buildup in the tub or shower regularly.  Attach bath mats  securely with double-sided non-slip rug tape.  Do not have throw rugs and other things on the floor that can make you trip. What can I do in the bedroom?  Use night lights.  Make sure that you have a light by your bed that is easy to reach.  Do not use any sheets or blankets that are too big for your bed. They should not hang down onto the floor.  Have a firm chair that has side arms. You can use this for support while you get dressed.  Do not have throw rugs and other things on the floor that can make you trip. What can I do in the kitchen?  Clean up any spills right away.  Avoid walking on wet floors.  Keep items that you use a lot in easy-to-reach places.  If you need to reach something above you, use a strong step stool that has a grab bar.  Keep electrical cords out of the way.  Do not use floor polish or wax that makes floors slippery. If you must use wax, use non-skid floor wax.  Do not have throw rugs and other things on the  floor that can make you trip. What can I do with my stairs?  Do not leave any items on the stairs.  Make sure that there are handrails on both sides of the stairs and use them. Fix handrails that are broken or loose. Make sure that handrails are as long as the stairways.  Check any carpeting to make sure that it is firmly attached to the stairs. Fix any carpet that is loose or worn.  Avoid having throw rugs at the top or bottom of the stairs. If you do have throw rugs, attach them to the floor with carpet tape.  Make sure that you have a light switch at the top of the stairs and the bottom of the stairs. If you do not have them, ask someone to add them for you. What else can I do to help prevent falls?  Wear shoes that:  Do not have high heels.  Have rubber bottoms.  Are comfortable and fit you well.  Are closed at the toe. Do not wear sandals.  If you use a stepladder:  Make sure that it is fully opened. Do not climb a closed  stepladder.  Make sure that both sides of the stepladder are locked into place.  Ask someone to hold it for you, if possible.  Clearly mark and make sure that you can see:  Any grab bars or handrails.  First and last steps.  Where the edge of each step is.  Use tools that help you move around (mobility aids) if they are needed. These include:  Canes.  Walkers.  Scooters.  Crutches.  Turn on the lights when you go into a dark area. Replace any light bulbs as soon as they burn out.  Set up your furniture so you have a clear path. Avoid moving your furniture around.  If any of your floors are uneven, fix them.  If there are any pets around you, be aware of where they are.  Review your medicines with your doctor. Some medicines can make you feel dizzy. This can increase your chance of falling. Ask your doctor what other things that you can do to help prevent falls. This information is not intended to replace advice given to you by your health care provider. Make sure you discuss any questions you have with your health care provider. Document Released: 07/10/2009 Document Revised: 02/19/2016 Document Reviewed: 10/18/2014 Elsevier Interactive Patient Education  2017 [employer].

## 2017-11-03 NOTE — Progress Notes (Signed)
Subjective:   [name] is a 84 y.o. female who presents for Medicare Annual (Subsequent) preventive examination.  Review of Systems:  N/A  Cardiac Risk Factors include: advanced age (>59men, >61 women);diabetes mellitus;dyslipidemia;hypertension     Objective:     Vitals: BP (!) 158/80 (BP Location: Left Arm)   Pulse 88   Temp 99 F (37.2 C) (Oral)   Ht 5\' 4"  (1.626 m)   Wt 147 lb 9.6 oz (67 kg)   BMI 25.34 kg/m   Body mass index is 25.34 kg/m.  Advanced Directives 11/03/2017 10/07/2016 09/28/2016 07/27/2016 07/01/2016 07/01/2016 01/05/2016  Does Patient Have a Medical Advance Directive? Yes Yes Yes Yes Yes Yes Yes  Type of Paramedic of [place];Living will [place];Living will Healthcare Power of Attorney Living will Living will;Healthcare Power of [place] will [place]  Does patient want to make changes to medical advance directive? - - - - No - Patient declined - -  Copy of [place] in Chart? No - copy requested No - copy requested - - No - copy requested - No - copy requested    Tobacco Social History   Tobacco Use  Smoking Status Never Smoker  Smokeless Tobacco Never Used     Counseling given: Not Answered   Clinical Intake:  Pre-visit preparation completed: Yes  Pain : No/denies pain Pain Score: 0-No pain     Nutritional Status: BMI 25 -29 Overweight Nutritional Risks: Nausea/ vomitting/ diarrhea(Some recent nausea and diarrhea. Currently pt does not feel well. ) Diabetes: Yes(type 2) CBG done?: No Did pt. bring in CBG monitor from home?: No  How often do you need to have someone help you when you read instructions, pamphlets, or other written materials from your doctor or pharmacy?: 4 - Often  Interpreter Needed?: No  Information entered by :: [facility], LPN  Past Medical History:  Diagnosis Date  . Angina pectoris ([place])   . Atrial fibrillation  ([place])   . Cervicalgia   . Chronic back pain   . Chronic obstructive pulmonary disease (COPD) ([place])   . COPD (chronic obstructive pulmonary disease) ([place])   . DDD (degenerative disc disease), lumbar   . Diabetes mellitus without complication ([place])   . Dysphagia   . Hyperlipemia   . Hypertension   . Vulvovaginitis    Past Surgical History:  Procedure Laterality Date  . gall bladder    . melanoma     removal neck and back  . PERIPHERAL VASCULAR CATHETERIZATION Left 01/05/2016   Procedure: Lower Extremity Angiography;  Surgeon: Algernon Huxley, MD;  Location: [facility];  Service: Cardiovascular;  Laterality: Left;  . PERIPHERAL VASCULAR CATHETERIZATION  01/05/2016   Procedure: Lower Extremity Intervention;  Surgeon: Algernon Huxley, MD;  Location: [facility];  Service: Cardiovascular;;  . ureterolithiasis     calculus removed  . VAGINAL HYSTERECTOMY    . VULVA / PERINEUM BIOPSY  05/29/2015   Family History  Problem Relation Age of Onset  . Ovarian cancer Other   . Diabetes Sister   . Colon cancer Brother   . Diabetes Brother   . Atrial fibrillation Daughter   . Breast cancer Neg Hx    Social History   Socioeconomic History  . Marital status: Widowed    Spouse name: None  . Number of children: 3  . Years of education: None  . Highest education level: 8th grade  Social  Needs  . Financial resource strain: Not hard at all  . Food insecurity - worry: Never true  . Food insecurity - inability: Never true  . Transportation needs - medical: No  . Transportation needs - non-medical: No  Occupational History  . Occupation: retired  Tobacco Use  . Smoking status: Never Smoker  . Smokeless tobacco: Never Used  Substance and Sexual Activity  . Alcohol use: No  . Drug use: No  . Sexual activity: No  Other Topics Concern  . None  Social History Narrative  . None    Outpatient Encounter Medications as of 11/03/2017  Medication Sig  . albuterol (PROVENTIL  HFA;VENTOLIN HFA) 108 (90 Base) MCG/ACT inhaler Inhale 2 puffs into the lungs every 4 (four) hours as needed for wheezing or shortness of breath.  . Ascorbic Acid (VITAMIN C) 1000 MG tablet Take 1,000 mg by mouth daily.   Marland Kitchen aspirin 325 MG tablet Take 325 mg by mouth daily. Reported on 01/05/2016  . ASSURE COMFORT LANCETS 30G MISC   . benzonatate (TESSALON) 100 MG capsule TAKE 1 CAPSULE BY MOUTH 3 TIMES DAILY ASNEEDED FOR COUGH  . Blood Glucose Calibration (ACCU-CHEK SMARTVIEW CONTROL) LIQD   . cetirizine (ZYRTEC) 10 MG tablet TAKE 1 TABLET BY MOUTH DAILY  . diltiazem (DILTIAZEM CD) 240 MG 24 hr capsule Take 1 capsule (240 mg total) by mouth daily.  Marland Kitchen donepezil (ARICEPT) 5 MG tablet   . ezetimibe (ZETIA) 10 MG tablet Take 1 tablet (10 mg total) by mouth daily.  Marland Kitchen FLUoxetine (PROZAC) 40 MG capsule TAKE 1 CAPSULE BY MOUTH EVERY DAY  . fluticasone (FLOVENT HFA) 220 MCG/ACT inhaler Inhale 2 puffs into the lungs 2 (two) times daily.  Marland Kitchen glucose blood test strip ACCU-CHEK AVIVA PLUS TEST STRP Use to check sugars 3 times daily.  . insulin glargine (LANTUS) 100 UNIT/ML injection Inject 0.05 mLs (5 Units total) into the skin at bedtime. (Patient taking differently: Inject 10 Units into the skin at bedtime. )  . insulin lispro (HUMALOG KWIKPEN) 100 UNIT/ML KiwkPen Inject into the skin. 12 units in the am, 10 units at lunch and 12 units in the pm  . lansoprazole (PREVACID) 30 MG capsule Take 1 capsule (30 mg total) by mouth daily at 12 noon.  Marland Kitchen losartan (COZAAR) 100 MG tablet Take 1 tablet (100 mg total) by mouth daily.  . Magnesium 400 MG CAPS Take by mouth daily.  . memantine (NAMENDA) 5 MG tablet Take 1 tablet (5 mg total) by mouth 2 (two) times daily.  . montelukast (SINGULAIR) 10 MG tablet TAKE ONE TABLET BY MOUTH EVERY EVENING AT BEDTIME  . Multiple Vitamins-Minerals (ICAPS AREDS 2 PO) Take by mouth.  . potassium chloride (K-DUR) 10 MEQ tablet TAKE ONE TABLET BY MOUTH TWICE DAILY  . SURE COMFORT PEN  NEEDLES 31G X 5 MM MISC   . traZODone (DESYREL) 50 MG tablet TAKE ONE AND A HALF TABLETS AT BEDTIME AS NEEDED FOR SLEEP  . vitamin E 1000 UNIT capsule Take 1,000 Units by mouth daily.   No facility-administered encounter medications on file as of 11/03/2017.     Activities of Daily Living In your present state of health, do you have any difficulty performing the following activities: 11/03/2017  Hearing? Y  Comment Wears bilateral hearing aids.   Vision? Y  Comment Needs a new eyeglass prescription.  Difficulty concentrating or making decisions? N  Walking or climbing stairs? Y  Comment Due to high fall risk  Dressing or bathing?  N  Doing errands, shopping? N  Preparing Food and eating ? N  Using the Toilet? N  In the past six months, have you accidently leaked urine? Y  Comment Wears protection.  Do you have problems with loss of bowel control? Y  Comment Very rare.  Managing your Medications? Y  Managing your Finances? Y  Housekeeping or managing your Housekeeping? Y  Some recent data might be hidden    Patient Care Team: Jerrol Banana., MD as PCP - General (Family Medicine) Solum, Betsey Holiday, MD as Physician Assistant (Endocrinology) Dingeldein, Remo Lipps, MD as Consulting Physician (Ophthalmology) Lucky Cowboy Erskine Squibb, MD as Referring Physician (Vascular Surgery) Corey Skains, MD as Consulting Physician (Cardiology) Ralene Bathe, MD as Consulting Physician (Dermatology)    Assessment:   This is a routine wellness examination for [place].  Exercise Activities and Dietary recommendations Current Exercise Habits: The patient does not participate in regular exercise at present, Exercise limited by: Other - see comments(Makes pt "tired")  Goals    . DIET - INCREASE WATER INTAKE     Recommend increasing water intake to 4 glasses a day.        Fall Risk Fall Risk  11/03/2017 10/07/2016 05/26/2016  Falls in the past year? No No Yes  Number falls in past yr: - - 1    Injury with Fall? - - No   Is the patient's home free of loose throw rugs in walkways, pet beds, electrical cords, etc?   yes      Grab bars in the bathroom? yes      Handrails on the stairs? n/a      Adequate lighting?   yes  Timed Get Up and Go performed: N/A  Depression Screen PHQ 2/9 Scores 11/03/2017 11/03/2017 10/07/2016 05/26/2016  PHQ - 2 Score 0 0 0 1  PHQ- 9 Score 11 - - -     Cognitive Function: Pt declined due to completing this recently.   MMSE - Mini Mental State Exam 06/07/2017  Orientation to time 5  Orientation to Place 5  Registration 3  Attention/ Calculation 5  Recall 2  Language- name 2 objects 2  Language- repeat 1  Language- follow 3 step command 2  Language- read & follow direction 0  Write a sentence 1  Copy design 1  Total score 27     6CIT Screen 10/07/2016  What month? 0 points  What time? 0 points  Count back from 20 0 points  Months in reverse 0 points  Repeat phrase 4 points    Immunization History  Administered Date(s) Administered  . Influenza Split 06/27/2010, 07/07/2011, 07/11/2012  . Influenza, High Dose Seasonal PF 07/13/2014, 06/24/2015, 06/16/2016, 07/16/2017  . Influenza,inj,Quad PF,6+ Mos 06/06/2013  . Pneumococcal Conjugate-13 11/28/2014  . Pneumococcal Polysaccharide-23 08/01/1995, 11/17/2012  . Td 05/20/2005    Qualifies for Shingles Vaccine? Due for Shingles vaccine. Declined my offer to administer today. Education has been provided regarding the importance of this vaccine. Pt has been advised to call her insurance company to determine her out of pocket expense. Advised she may also receive this vaccine at her local pharmacy or Health Dept. Verbalized acceptance and understanding.  Screening Tests Health Maintenance  Topic Date Due  . TETANUS/TDAP  05/21/2015  . HEMOGLOBIN A1C  01/30/2018  . OPHTHALMOLOGY EXAM  05/03/2018  . FOOT EXAM  08/09/2018  . INFLUENZA VACCINE  Completed  . DEXA SCAN  Completed  . PNA vac Low  Risk Adult  Completed    Cancer Screenings: Lung: Low Dose CT Chest recommended if Age 10-80 years, 30 pack-year currently smoking OR have quit w/in 15years. Patient does not qualify. Breast:  Up to date on Mammogram? Yes   Up to date of Bone Density/Dexa? Yes Colorectal: Up to date  Additional Screenings:  Hepatitis B/HIV/Syphillis: Pt declines today.  Hepatitis C Screening: Pt declines today.      Plan:  I have personally reviewed and addressed the Medicare Annual Wellness questionnaire and have noted the following in the patient's chart:  A. Medical and social history B. Use of alcohol, tobacco or illicit drugs  C. Current medications and supplements D. Functional ability and status E.  Nutritional status F.  Physical activity G. Advance directives H. List of other physicians I.  Hospitalizations, surgeries, and ER visits in previous 12 months J.  [place] such as hearing and vision if needed, cognitive and depression L. Referrals and appointments - none  In addition, I have reviewed and discussed with patient certain preventive protocols, quality metrics, and best practice recommendations. A written personalized care plan for preventive services as well as general preventive health recommendations were provided to patient.  See attached scanned questionnaire for additional information.   Signed,  [name], LPN Nurse Health Advisor   Nurse Recommendations: Pt declined the tetanus vaccine today.

## 2017-12-06 ENCOUNTER — Ambulatory Visit: Payer: Self-pay | Admitting: Family Medicine

## 2017-12-06 ENCOUNTER — Other Ambulatory Visit: Payer: Self-pay | Admitting: Family Medicine

## 2017-12-06 DIAGNOSIS — J449 Chronic obstructive pulmonary disease, unspecified: Secondary | ICD-10-CM

## 2017-12-07 DIAGNOSIS — Z794 Long term (current) use of insulin: Secondary | ICD-10-CM | POA: Diagnosis not present

## 2017-12-07 DIAGNOSIS — E1122 Type 2 diabetes mellitus with diabetic chronic kidney disease: Secondary | ICD-10-CM | POA: Diagnosis not present

## 2017-12-07 DIAGNOSIS — N183 Chronic kidney disease, stage 3 (moderate): Secondary | ICD-10-CM | POA: Diagnosis not present

## 2017-12-07 DIAGNOSIS — E1159 Type 2 diabetes mellitus with other circulatory complications: Secondary | ICD-10-CM | POA: Diagnosis not present

## 2017-12-13 ENCOUNTER — Other Ambulatory Visit: Payer: Self-pay

## 2017-12-13 ENCOUNTER — Ambulatory Visit (INDEPENDENT_AMBULATORY_CARE_PROVIDER_SITE_OTHER): Payer: Medicare Other | Admitting: Family Medicine

## 2017-12-13 VITALS — BP 136/64 | HR 80 | Temp 98.0°F | Resp 16

## 2017-12-13 DIAGNOSIS — L570 Actinic keratosis: Secondary | ICD-10-CM

## 2017-12-13 DIAGNOSIS — G3183 Dementia with Lewy bodies: Secondary | ICD-10-CM | POA: Diagnosis not present

## 2017-12-13 DIAGNOSIS — I739 Peripheral vascular disease, unspecified: Secondary | ICD-10-CM | POA: Diagnosis not present

## 2017-12-13 DIAGNOSIS — F0280 Dementia in other diseases classified elsewhere without behavioral disturbance: Secondary | ICD-10-CM | POA: Diagnosis not present

## 2017-12-13 DIAGNOSIS — I10 Essential (primary) hypertension: Secondary | ICD-10-CM

## 2017-12-13 DIAGNOSIS — E1121 Type 2 diabetes mellitus with diabetic nephropathy: Secondary | ICD-10-CM | POA: Diagnosis not present

## 2017-12-13 DIAGNOSIS — I130 Hypertensive heart and chronic kidney disease with heart failure and stage 1 through stage 4 chronic kidney disease, or unspecified chronic kidney disease: Secondary | ICD-10-CM

## 2017-12-13 NOTE — Progress Notes (Signed)
[name]  MRN: [id number] DOB: [birth year]  Subjective:  HPI   The patient is an 84 year old female who presents for follow up of her hypertension.  Her last visit was on 11/03/17.  At that time her Diltiazem was adjusted to 360 mg daily.   She denies any adverse effects of the increased dose and is compliant in taking the medicine.  She does complain of constipation and has had to take stool softeners twice. Cognitive issues are stable per daughter. No new issues.   Patient Active Problem List   Diagnosis Date Noted  . PVD (peripheral vascular disease) ([place]) 08/16/2017  . Pain in limb 07/27/2016  . Hallucinations 07/07/2016  . Chest pain 07/01/2016  . Hypokalemia 09/03/2015  . Insomnia 07/23/2015  . Headache 07/23/2015  . Type 2 diabetes mellitus ([place]) 06/24/2015  . Chronic vulvitis 06/02/2015  . Allergic rhinitis 05/30/2015  . Back ache 05/30/2015  . Body mass index (BMI) of 29.0-29.9 in adult 05/30/2015  . Edema extremities 05/30/2015  . Dilatation of esophagus 05/30/2015  . Fall 05/30/2015  . H/O deep venous thrombosis 05/30/2015  . Hypercholesteremia 05/30/2015  . Heart & renal disease, hypertensive, with heart failure ([place]) 05/30/2015  . Below normal amount of sodium in the blood 05/30/2015  . Calculus of kidney 05/30/2015  . Gonalgia 05/30/2015  . Psoriasis 05/30/2015  . Avitaminosis D 05/30/2015  . Cough 04/01/2015  . Combined fat and carbohydrate induced hyperlipemia 12/30/2014  . Paroxysmal atrial fibrillation ([place]) 07/10/2014  . Abnormal gait 07/08/2014  . Anxiety state 07/08/2014  . Chronic kidney disease 07/08/2014  . Acid reflux 07/08/2014  . 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[address]) 07/08/2014  . Chronic obstructive pulmonary disease ([place]) 07/08/2014  . Benign essential HTN 06/26/2014  . Carotid artery narrowing 06/26/2014  . Intermittent claudication ([place]) 06/26/2014  . Basal cell carcinoma of face 05/01/2014  . Disease of female genital organs  11/28/2012  . Incomplete bladder emptying 09/05/2012  . Excessive urination at night 08/15/2012  . Basal cell carcinoma of ear 10/26/2011    Past Medical History:  Diagnosis Date  . Angina pectoris ([place])   . Atrial fibrillation ([place])   . Cervicalgia   . Chronic back pain   . Chronic obstructive pulmonary disease (COPD) ([place])   . COPD (chronic obstructive pulmonary disease) ([place])   . DDD (degenerative disc disease), lumbar   . Diabetes mellitus without complication ([place])   . Dysphagia   . Hyperlipemia   . Hypertension   . Vulvovaginitis     Social History   Socioeconomic History  . Marital status: Widowed    Spouse name: Not on file  . Number of children: 3  . Years of education: Not on file  . Highest education level: 8th grade  Social Needs  . Financial resource strain: Not hard at all  . Food insecurity - worry: Never true  . Food insecurity - inability: Never true  . Transportation needs - medical: No  . Transportation needs - non-medical: No  Occupational History  . Occupation: retired  Tobacco Use  . Smoking status: Never Smoker  . Smokeless tobacco: Never Used  Substance and Sexual Activity  . Alcohol use: No  . Drug use: No  . Sexual activity: No  Other Topics Concern  . Not on file  Social History Narrative  . Not on file    Outpatient Encounter Medications as of 12/13/2017  Medication Sig  . albuterol (PROVENTIL HFA;VENTOLIN HFA) 108 (90 Base) MCG/ACT inhaler  Inhale 2 puffs into the lungs every 4 (four) hours as needed for wheezing or shortness of breath.  . Ascorbic Acid (VITAMIN C) 1000 MG tablet Take 1,000 mg by mouth daily.   Marland Kitchen aspirin 325 MG tablet Take 325 mg by mouth daily. Reported on 01/05/2016  . ASSURE COMFORT LANCETS 30G MISC   . Blood Glucose Calibration (ACCU-CHEK SMARTVIEW CONTROL) LIQD   . cetirizine (ZYRTEC) 10 MG tablet TAKE 1 TABLET BY MOUTH DAILY  . diltiazem (CARDIZEM CD) 360 MG 24 hr capsule Take 1 capsule (360 mg total) by mouth  daily.  Marland Kitchen donepezil (ARICEPT) 5 MG tablet   . ezetimibe (ZETIA) 10 MG tablet Take 1 tablet (10 mg total) by mouth daily.  Marland Kitchen FLUoxetine (PROZAC) 40 MG capsule TAKE 1 CAPSULE BY MOUTH EVERY DAY  . fluticasone (FLOVENT HFA) 220 MCG/ACT inhaler Inhale 2 puffs into the lungs 2 (two) times daily.  Marland Kitchen glucose blood test strip ACCU-CHEK AVIVA PLUS TEST STRP Use to check sugars 3 times daily.  . insulin glargine (LANTUS) 100 UNIT/ML injection Inject 0.05 mLs (5 Units total) into the skin at bedtime. (Patient taking differently: Inject 10 Units into the skin at bedtime. )  . insulin lispro (HUMALOG KWIKPEN) 100 UNIT/ML KiwkPen Inject into the skin. 12 units in the am, 10 units at lunch and 12 units in the pm  . lansoprazole (PREVACID) 30 MG capsule Take 1 capsule (30 mg total) by mouth daily at 12 noon.  Marland Kitchen losartan (COZAAR) 100 MG tablet Take 1 tablet (100 mg total) by mouth daily.  . Magnesium 400 MG CAPS Take by mouth daily.  . memantine (NAMENDA) 5 MG tablet Take 1 tablet (5 mg total) by mouth 2 (two) times daily.  . montelukast (SINGULAIR) 10 MG tablet TAKE ONE TABLET BY MOUTH EVERY EVENING AT BEDTIME  . Multiple Vitamins-Minerals (ICAPS AREDS 2 PO) Take by mouth.  . potassium chloride (K-DUR) 10 MEQ tablet TAKE ONE TABLET BY MOUTH TWICE DAILY  . SURE COMFORT PEN NEEDLES 31G X 5 MM MISC   . traZODone (DESYREL) 50 MG tablet TAKE ONE AND A HALF TABLETS AT BEDTIME AS NEEDED FOR SLEEP  . vitamin E 1000 UNIT capsule Take 1,000 Units by mouth daily.  . [DISCONTINUED] benzonatate (TESSALON) 100 MG capsule TAKE 1 CAPSULE 3 TIMES DAILY AS NEEDED FOR COUGH   No facility-administered encounter medications on file as of 12/13/2017.     Allergies  Allergen Reactions  . Neomycin-Bacitracin Zn-Polymyx Swelling  . Aricept [Donepezil Hcl] Diarrhea  . Benzalkonium Chloride Itching and Swelling  . Ibuprofen     Other reaction(s): Dizziness Heart fluttering. Tachycardia. Tachycardia.  . Valdecoxib Nausea And  Vomiting  . Albuterol Rash  . Latex Rash  . Tape Rash    blisters  . Triamcinolone Rash    Review of Systems  Constitutional: Positive for malaise/fatigue. Negative for fever.  HENT: Negative.   Eyes: Negative.   Respiratory: Positive for cough. Negative for shortness of breath and wheezing.   Cardiovascular: Negative for chest pain, palpitations, orthopnea, claudication and leg swelling.  Gastrointestinal: Positive for constipation.  Genitourinary: Negative.   Skin: Negative.   Neurological: Negative for weakness.  Endo/Heme/Allergies: Negative.   Psychiatric/Behavioral: Negative.     Objective:  BP 136/64 (BP Location: Left Arm, Patient Position: Sitting, Cuff Size: Normal)   Pulse 80   Temp 98 F (36.7 C) (Oral)   Resp 16    First BP 164/80  Physical Exam  Constitutional: She is oriented to  person, place, and time and well-developed, well-nourished, and in no distress.  HENT:  Head: Normocephalic and atraumatic.  Right Ear: External ear normal.  Left Ear: External ear normal.  Nose: Nose normal.  Eyes: Conjunctivae are normal.  Neck: No thyromegaly present.  Cardiovascular: Normal rate, regular rhythm and normal heart sounds.  Pulmonary/Chest: Effort normal and breath sounds normal.  Abdominal: Soft.  Neurological: She is alert and oriented to person, place, and time. Gait normal. GCS score is 15.  Skin: Skin is warm and dry.  Very fair skin.Multiple AKs.  Psychiatric: Mood, memory, affect and judgment normal.    Assessment and Plan :  1. Benign essential HTN   2. Heart & renal disease, hypertensive, with heart failure (HCC)   3. AK (actinic keratosis)  - Ambulatory referral to Dermatology 4.TIIDM Per endocrine. 5.Lewy Body Dementia 6.PAD 7.CKD 8.Chronic Constipation Made worse by CCB for HTN. Colace daily.  I have done the exam and reviewed the chart and it is accurate to the best of my knowledge. Development worker, community has been used and  any errors  in dictation or transcription are unintentional. Miguel Aschoff M.D. [facility]

## 2017-12-13 NOTE — Patient Instructions (Signed)
Take colace daily.

## 2018-01-11 DIAGNOSIS — D485 Neoplasm of uncertain behavior of skin: Secondary | ICD-10-CM | POA: Diagnosis not present

## 2018-01-11 DIAGNOSIS — L219 Seborrheic dermatitis, unspecified: Secondary | ICD-10-CM | POA: Diagnosis not present

## 2018-01-11 DIAGNOSIS — Z85820 Personal history of malignant melanoma of skin: Secondary | ICD-10-CM | POA: Diagnosis not present

## 2018-01-11 DIAGNOSIS — D225 Melanocytic nevi of trunk: Secondary | ICD-10-CM | POA: Diagnosis not present

## 2018-01-11 DIAGNOSIS — Z85828 Personal history of other malignant neoplasm of skin: Secondary | ICD-10-CM | POA: Diagnosis not present

## 2018-01-11 DIAGNOSIS — D224 Melanocytic nevi of scalp and neck: Secondary | ICD-10-CM | POA: Diagnosis not present

## 2018-01-12 ENCOUNTER — Telehealth: Payer: Self-pay | Admitting: Family Medicine

## 2018-01-12 NOTE — Telephone Encounter (Signed)
Please advise. Thanks.  

## 2018-01-12 NOTE — Telephone Encounter (Signed)
Not, presently.

## 2018-01-12 NOTE — Telephone Encounter (Signed)
Patient states that her ankles are swelling during the day.  She states that it is only her ankles.  She is wanting to know if you want to call in something to [facility] or if there is something she needs to do for this.

## 2018-01-12 NOTE — Telephone Encounter (Signed)
Pt advised.

## 2018-01-30 ENCOUNTER — Telehealth: Payer: Self-pay | Admitting: Family Medicine

## 2018-01-30 MED ORDER — HYDROCHLOROTHIAZIDE 12.5 MG PO CAPS: 12.5000 mg | ORAL_CAPSULE | Freq: Every day | ORAL | 1 refills | Status: DC

## 2018-01-30 NOTE — Telephone Encounter (Signed)
Pt called saying her feet are swelling.  Left foot is now staring to hurt.  She said called about two weeks ago and they told her to call back if it got worse.  Her call back is [phone]

## 2018-01-30 NOTE — Telephone Encounter (Signed)
Per Dr Rosanna Randy, she is to go back on her diuretic.

## 2018-02-13 ENCOUNTER — Ambulatory Visit
Admission: RE | Admit: 2018-02-13 | Discharge: 2018-02-13 | Disposition: A | Discharge status: Home / Self Care | Payer: Medicare Other | Source: Ambulatory Visit | Attending: Family Medicine | Admitting: Family Medicine

## 2018-02-13 ENCOUNTER — Ambulatory Visit (INDEPENDENT_AMBULATORY_CARE_PROVIDER_SITE_OTHER): Payer: Medicare Other | Admitting: Family Medicine

## 2018-02-13 VITALS — BP 140/52 | HR 80 | Temp 98.3°F | Resp 16 | Wt 149.0 lb

## 2018-02-13 DIAGNOSIS — M7989 Other specified soft tissue disorders: Secondary | ICD-10-CM

## 2018-02-13 DIAGNOSIS — E1121 Type 2 diabetes mellitus with diabetic nephropathy: Secondary | ICD-10-CM

## 2018-02-13 DIAGNOSIS — C439 Malignant melanoma of skin, unspecified: Secondary | ICD-10-CM | POA: Diagnosis not present

## 2018-02-13 DIAGNOSIS — I130 Hypertensive heart and chronic kidney disease with heart failure and stage 1 through stage 4 chronic kidney disease, or unspecified chronic kidney disease: Secondary | ICD-10-CM

## 2018-02-13 DIAGNOSIS — M79672 Pain in left foot: Secondary | ICD-10-CM | POA: Diagnosis not present

## 2018-02-13 DIAGNOSIS — I10 Essential (primary) hypertension: Secondary | ICD-10-CM

## 2018-02-13 DIAGNOSIS — G3183 Dementia with Lewy bodies: Secondary | ICD-10-CM

## 2018-02-13 DIAGNOSIS — F0280 Dementia in other diseases classified elsewhere without behavioral disturbance: Secondary | ICD-10-CM | POA: Diagnosis not present

## 2018-02-13 DIAGNOSIS — C44310 Basal cell carcinoma of skin of unspecified parts of face: Secondary | ICD-10-CM

## 2018-02-13 DIAGNOSIS — I480 Paroxysmal atrial fibrillation: Secondary | ICD-10-CM

## 2018-02-13 NOTE — Progress Notes (Signed)
[name]  MRN: [id number] DOB: Oct 10, 1928  Subjective:  HPI   The patient is an 84 year old female who presents today for follow up after starting back on diuretic for her edema.   The patient has been taking the HCTZ for about 10 days.  She states that she sees a little improvement but still has a lot of swelling.  Her left foot is worse than the right.  She states that the left foot has pain as well.   Patient Active Problem List   Diagnosis Date Noted  . PVD (peripheral vascular disease) ([place]) 08/16/2017  . Pain in limb 07/27/2016  . Hallucinations 07/07/2016  . Chest pain 07/01/2016  . Hypokalemia 09/03/2015  . Insomnia 07/23/2015  . Headache 07/23/2015  . Type 2 diabetes mellitus ([place]) 06/24/2015  . Chronic vulvitis 06/02/2015  . Allergic rhinitis 05/30/2015  . Back ache 05/30/2015  . Body mass index (BMI) of 29.0-29.9 in adult 05/30/2015  . Edema extremities 05/30/2015  . Dilatation of esophagus 05/30/2015  . Fall 05/30/2015  . H/O deep venous thrombosis 05/30/2015  . Hypercholesteremia 05/30/2015  . Heart & renal disease, hypertensive, with heart failure ([place]) 05/30/2015  . Below normal amount of sodium in the blood 05/30/2015  . Calculus of kidney 05/30/2015  . Gonalgia 05/30/2015  . Psoriasis 05/30/2015  . Avitaminosis D 05/30/2015  . Cough 04/01/2015  . Combined fat and carbohydrate induced hyperlipemia 12/30/2014  . Paroxysmal atrial fibrillation ([place]) 07/10/2014  . Abnormal gait 07/08/2014  . Anxiety state 07/08/2014  . Chronic kidney disease 07/08/2014  . Acid reflux 07/08/2014  . 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[place]) 07/08/2014  . Chronic obstructive pulmonary disease ([place]) 07/08/2014  . Benign essential HTN 06/26/2014  . Carotid artery narrowing 06/26/2014  . Intermittent claudication ([place]) 06/26/2014  . Basal cell carcinoma of face 05/01/2014  . Disease of female genital organs 11/28/2012  . Incomplete bladder emptying 09/05/2012  .  Excessive urination at night 08/15/2012  . Basal cell carcinoma of ear 10/26/2011    Past Medical History:  Diagnosis Date  . Angina pectoris ([place])   . Atrial fibrillation ([place])   . Cervicalgia   . Chronic back pain   . Chronic obstructive pulmonary disease (COPD) ([place])   . COPD (chronic obstructive pulmonary disease) ([place])   . DDD (degenerative disc disease), lumbar   . Diabetes mellitus without complication ([place])   . Dysphagia   . Hyperlipemia   . Hypertension   . Vulvovaginitis     Social History   Socioeconomic History  . Marital status: Widowed    Spouse name: Not on file  . Number of children: 3  . Years of education: Not on file  . Highest education level: 8th grade  Occupational History  . Occupation: retired  Scientific laboratory technician  . Financial resource strain: Not hard at all  . Food insecurity:    Worry: Never true    Inability: Never true  . Transportation needs:    Medical: No    Non-medical: No  Tobacco Use  . Smoking status: Never Smoker  . Smokeless tobacco: Never Used  Substance and Sexual Activity  . Alcohol use: No  . Drug use: No  . Sexual activity: Never  Lifestyle  . Physical activity:    Days per week: Not on file    Minutes per session: Not on file  . Stress: Only a little  Relationships  . Social connections:    Talks on phone: Not on file  Gets together: Not on file    Attends religious service: Not on file    Active member of club or organization: Not on file    Attends meetings of clubs or organizations: Not on file    Relationship status: Not on file  . Intimate partner violence:    Fear of current or ex partner: Not on file    Emotionally abused: Not on file    Physically abused: Not on file    Forced sexual activity: Not on file  Other Topics Concern  . Not on file  Social History Narrative  . Not on file    Outpatient Encounter Medications as of 02/13/2018  Medication Sig  . albuterol (PROVENTIL HFA;VENTOLIN HFA) 108 (90  Base) MCG/ACT inhaler Inhale 2 puffs into the lungs every 4 (four) hours as needed for wheezing or shortness of breath.  . Ascorbic Acid (VITAMIN C) 1000 MG tablet Take 1,000 mg by mouth daily.   Marland Kitchen aspirin 325 MG tablet Take 325 mg by mouth daily. Reported on 01/05/2016  . ASSURE COMFORT LANCETS 30G MISC   . Blood Glucose Calibration (ACCU-CHEK SMARTVIEW CONTROL) LIQD   . cetirizine (ZYRTEC) 10 MG tablet TAKE 1 TABLET BY MOUTH DAILY  . diltiazem (CARDIZEM CD) 360 MG 24 hr capsule Take 1 capsule (360 mg total) by mouth daily.  Marland Kitchen ezetimibe (ZETIA) 10 MG tablet Take 1 tablet (10 mg total) by mouth daily.  Marland Kitchen FLUoxetine (PROZAC) 40 MG capsule TAKE 1 CAPSULE BY MOUTH EVERY DAY  . fluticasone (FLOVENT HFA) 220 MCG/ACT inhaler Inhale 2 puffs into the lungs 2 (two) times daily.  Marland Kitchen glucose blood test strip ACCU-CHEK AVIVA PLUS TEST STRP Use to check sugars 3 times daily.  . hydrochlorothiazide (MICROZIDE) 12.5 MG capsule Take 1 capsule (12.5 mg total) by mouth daily.  . insulin glargine (LANTUS) 100 UNIT/ML injection Inject 0.05 mLs (5 Units total) into the skin at bedtime. (Patient taking differently: Inject 10 Units into the skin at bedtime. )  . insulin lispro (HUMALOG KWIKPEN) 100 UNIT/ML KiwkPen Inject into the skin. 12 units in the am, 10 units at lunch and 12 units in the pm  . lansoprazole (PREVACID) 30 MG capsule Take 1 capsule (30 mg total) by mouth daily at 12 noon.  Marland Kitchen losartan (COZAAR) 100 MG tablet Take 1 tablet (100 mg total) by mouth daily.  . Magnesium 400 MG CAPS Take by mouth daily.  . memantine (NAMENDA) 5 MG tablet Take 1 tablet (5 mg total) by mouth 2 (two) times daily.  . montelukast (SINGULAIR) 10 MG tablet TAKE ONE TABLET BY MOUTH EVERY EVENING AT BEDTIME  . Multiple Vitamins-Minerals (ICAPS AREDS 2 PO) Take by mouth.  . potassium chloride (K-DUR) 10 MEQ tablet TAKE ONE TABLET BY MOUTH TWICE DAILY  . SURE COMFORT PEN NEEDLES 31G X 5 MM MISC   . traZODone (DESYREL) 50 MG tablet TAKE  ONE AND A HALF TABLETS AT BEDTIME AS NEEDED FOR SLEEP  . vitamin E 1000 UNIT capsule Take 1,000 Units by mouth daily.  . [DISCONTINUED] donepezil (ARICEPT) 5 MG tablet    No facility-administered encounter medications on file as of 02/13/2018.     Allergies  Allergen Reactions  . Neomycin-Bacitracin Zn-Polymyx Swelling  . Aricept [Donepezil Hcl] Diarrhea  . Benzalkonium Chloride Itching and Swelling  . Ibuprofen     Other reaction(s): Dizziness Heart fluttering. Tachycardia. Tachycardia.  . Valdecoxib Nausea And Vomiting  . Albuterol Rash  . Latex Rash  . Tape Rash  blisters  . Triamcinolone Rash    Review of Systems  Constitutional: Negative for fever and malaise/fatigue.  Eyes: Negative.   Respiratory: Positive for cough and wheezing. Negative for shortness of breath.   Cardiovascular: Negative for chest pain and palpitations.  Gastrointestinal: Negative.   Musculoskeletal: Positive for joint pain.  Skin: Negative.   Endo/Heme/Allergies: Negative.   Psychiatric/Behavioral: Negative.     Objective:  BP (!) 140/52 (BP Location: Right Arm, Patient Position: Sitting, Cuff Size: Normal)   Pulse 80   Temp 98.3 F (36.8 C) (Oral)   Resp 16   Wt 149 lb (67.6 kg)   BMI 25.58 kg/m   Physical Exam  Constitutional: She is oriented to person, place, and time and well-developed, well-nourished, and in no distress.  HENT:  Head: Normocephalic and atraumatic.  Right Ear: External ear normal.  Left Ear: External ear normal.  Nose: Nose normal.  Eyes: Conjunctivae are normal.  Neck: No thyromegaly present.  Cardiovascular: Normal rate, regular rhythm and normal heart sounds.  Pulmonary/Chest: Effort normal and breath sounds normal.  Abdominal: Soft.  Genitourinary: Right adnexa normal.  Musculoskeletal:  Mild swelling of dorsal left foor without tenderness or warmth/erythema.  Neurological: She is alert and oriented to person, place, and time. Gait normal. GCS score is  15.  Skin: Skin is warm and dry.  Psychiatric: Mood, memory, affect and judgment normal.  Vitals reviewed.   Assessment and Plan :  1. Benign essential HTN Stop HCTZ.  metabolic panel  2. Type 2 diabetes mellitus with diabetic nephropathy, without long-term current use of insulin ([place]) Per Dr Gabriel Carina.  3. Left foot pain   - DG Foot Complete Left; Future  4. Foot swelling Likely slight sprain. Xray but will refer if she has any pain. - DG Foot Complete Left; Future 5.Lewy Body dementia  I have done the exam and reviewed the chart and it is accurate to the best of my knowledge. Development worker, community has been used and  any errors in dictation or transcription are unintentional. Miguel Aschoff M.D. [facility]

## 2018-02-14 ENCOUNTER — Telehealth: Payer: Self-pay

## 2018-02-14 LAB — COMPREHENSIVE METABOLIC PANEL
A/G RATIO: 1.6 (ref 1.2–2.2)
ALT: 18 IU/L (ref 0–32)
AST: 20 IU/L (ref 0–40)
Albumin: 4.1 g/dL (ref 3.5–4.7)
Alkaline Phosphatase: 119 IU/L — ABNORMAL HIGH (ref 39–117)
BILIRUBIN TOTAL: 0.3 mg/dL (ref 0.0–1.2)
BUN/Creatinine Ratio: 16 (ref 12–28)
BUN: 18 mg/dL (ref 8–27)
CALCIUM: 9.2 mg/dL (ref 8.7–10.3)
CHLORIDE: 94 mmol/L — AB (ref 96–106)
CO2: 26 mmol/L (ref 20–29)
Creatinine, Ser: 1.12 mg/dL — ABNORMAL HIGH (ref 0.57–1.00)
GFR calc Af Amer: 51 mL/min/{1.73_m2} — ABNORMAL LOW (ref >59)
GFR, EST NON AFRICAN AMERICAN: 45 mL/min/{1.73_m2} — AB (ref >59)
GLOBULIN, TOTAL: 2.5 g/dL (ref 1.5–4.5)
Glucose: 132 mg/dL — ABNORMAL HIGH (ref 65–99)
POTASSIUM: 4.2 mmol/L (ref 3.5–5.2)
SODIUM: 137 mmol/L (ref 134–144)
Total Protein: 6.6 g/dL (ref 6.0–8.5)

## 2018-02-14 NOTE — Telephone Encounter (Signed)
-----   Message from Jerrol Banana., MD sent at 02/14/2018 12:00 PM EDT ----- Stable.

## 2018-02-14 NOTE — Telephone Encounter (Signed)
Advised patient of results.  

## 2018-02-14 NOTE — Telephone Encounter (Signed)
Left message to call back  

## 2018-02-15 ENCOUNTER — Ambulatory Visit: Payer: Medicare Other | Admitting: Family Medicine

## 2018-02-15 ENCOUNTER — Telehealth: Payer: Self-pay | Admitting: Family Medicine

## 2018-02-15 NOTE — Telephone Encounter (Signed)
Please review xray

## 2018-02-15 NOTE — Telephone Encounter (Signed)
pt called regarding x ray results from Monday  Pt's call back is [phone]  [facility]

## 2018-02-15 NOTE — Telephone Encounter (Signed)
No fracture

## 2018-02-15 NOTE — Telephone Encounter (Signed)
Patient advised as below.  

## 2018-02-17 DIAGNOSIS — F0280 Dementia in other diseases classified elsewhere without behavioral disturbance: Secondary | ICD-10-CM | POA: Insufficient documentation

## 2018-02-17 DIAGNOSIS — G3183 Dementia with Lewy bodies: Secondary | ICD-10-CM

## 2018-03-06 DIAGNOSIS — C44729 Squamous cell carcinoma of skin of left lower limb, including hip: Secondary | ICD-10-CM | POA: Diagnosis not present

## 2018-03-06 DIAGNOSIS — D485 Neoplasm of uncertain behavior of skin: Secondary | ICD-10-CM | POA: Diagnosis not present

## 2018-03-06 DIAGNOSIS — L578 Other skin changes due to chronic exposure to nonionizing radiation: Secondary | ICD-10-CM | POA: Diagnosis not present

## 2018-03-20 DIAGNOSIS — E1122 Type 2 diabetes mellitus with diabetic chronic kidney disease: Secondary | ICD-10-CM | POA: Diagnosis not present

## 2018-03-20 DIAGNOSIS — N183 Chronic kidney disease, stage 3 (moderate): Secondary | ICD-10-CM | POA: Diagnosis not present

## 2018-03-20 DIAGNOSIS — Z794 Long term (current) use of insulin: Secondary | ICD-10-CM | POA: Diagnosis not present

## 2018-03-21 DIAGNOSIS — Z85828 Personal history of other malignant neoplasm of skin: Secondary | ICD-10-CM | POA: Diagnosis not present

## 2018-03-21 DIAGNOSIS — L98499 Non-pressure chronic ulcer of skin of other sites with unspecified severity: Secondary | ICD-10-CM | POA: Diagnosis not present

## 2018-03-27 ENCOUNTER — Other Ambulatory Visit: Payer: Self-pay | Admitting: Family Medicine

## 2018-03-27 DIAGNOSIS — E1159 Type 2 diabetes mellitus with other circulatory complications: Secondary | ICD-10-CM | POA: Diagnosis not present

## 2018-03-27 DIAGNOSIS — N183 Chronic kidney disease, stage 3 (moderate): Secondary | ICD-10-CM | POA: Diagnosis not present

## 2018-03-27 DIAGNOSIS — Z794 Long term (current) use of insulin: Secondary | ICD-10-CM | POA: Diagnosis not present

## 2018-03-27 DIAGNOSIS — E1122 Type 2 diabetes mellitus with diabetic chronic kidney disease: Secondary | ICD-10-CM | POA: Diagnosis not present

## 2018-03-27 DIAGNOSIS — E876 Hypokalemia: Secondary | ICD-10-CM

## 2018-03-27 DIAGNOSIS — R809 Proteinuria, unspecified: Secondary | ICD-10-CM | POA: Diagnosis not present

## 2018-04-03 ENCOUNTER — Ambulatory Visit: Payer: Self-pay | Admitting: Family Medicine

## 2018-04-28 ENCOUNTER — Other Ambulatory Visit: Payer: Self-pay | Admitting: Family Medicine

## 2018-04-28 DIAGNOSIS — J449 Chronic obstructive pulmonary disease, unspecified: Secondary | ICD-10-CM

## 2018-05-12 ENCOUNTER — Ambulatory Visit: Payer: Medicare Other | Admitting: Internal Medicine

## 2018-05-12 ENCOUNTER — Encounter: Payer: Self-pay | Admitting: Internal Medicine

## 2018-05-12 VITALS — BP 128/68 | HR 71 | Ht 64.0 in | Wt 148.0 lb

## 2018-05-12 DIAGNOSIS — J449 Chronic obstructive pulmonary disease, unspecified: Secondary | ICD-10-CM

## 2018-05-12 MED ORDER — FLUTICASONE-SALMETEROL 230-21 MCG/ACT IN AERO: 2.0000 | INHALATION_SPRAY | Freq: Two times a day (BID) | RESPIRATORY_TRACT | 12 refills | Status: DC

## 2018-05-12 NOTE — Patient Instructions (Signed)
START ADVAIR HFA 2 puffs twice daily  STOP FLOVENT  ALBUTEROL 2-4 puffs every 4 hrs AS NEEDED FOR COUGH/SOB  AVOID SECOND HAND SMOKE

## 2018-05-12 NOTE — Progress Notes (Signed)
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 3 years ago Has had prednisone use 10 years ago for psoriasis She has been prescribed albuterol asn has not made a difference Offcie SPiro-WNL Flow volume loops show some evidence of scooping which could be related to obstructive airways disease   Current HPI No signs of infection at this time Cough has improved Shortness of breath has improved Prednisone therapy at last visit helped tremendously Flovent 220 twice a day helps tremendously No signs of acute distress at this time Using albuterol more frequently over the last several weeks  No signs of heart failure at this time     REVIEW OF SYSTEMS:   Constitutional: Negative for fever, chills, weight loss, malaise/fatigue and diaphoresis.  HENT: Negative for hearing loss, ear pain, nosebleeds, congestion, sore throat, neck pain, tinnitus and ear discharge.   Eyes: Negative for blurred vision, double vision, photophobia, pain, discharge and redness.  Respiratory: +cough, hemoptysis, +sputum production, shortness of breath, wheezing and stridor.   Cardiovascular: Negative for chest pain, palpitations, orthopnea, claudication, leg swelling and PND.   ALL OTHER ROS ARE NEGATIVE  BP 128/68 (BP Location: Left Arm, Cuff Size: Normal)   Pulse 71   Ht 5\' 4"  (1.626 m)   Wt 148 lb (67.1 kg)   SpO2 95%   BMI 25.40 kg/m   Physical Examination:   GENERAL:NAD, no fevers, chills, no weakness no fatigue HEAD: Normocephalic, atraumatic.  EYES: Pupils  equal, round, reactive to light. Extraocular muscles intact. No scleral icterus.  MOUTH: Moist mucosal membrane.   EAR, NOSE, THROAT: Clear without exudates. No external lesions.  NECK: Supple. No thyromegaly. No nodules. No JVD.  PULMONARY:CTA B/L no wheezes, no crackles, no rhonchi CARDIOVASCULAR: S1 and S2. Regular rate and rhythm. No murmurs, rubs, or gallops. No edema.    CT chest Oct 2017 I have Independently reviewed images of  CT scan   Interpretation:no PE, no effusions, no opacities noted, NL CT scan There is some evidence of bronchiectasis in lower lobes    ASSESSMENT / PLAN: 84 yo white female with chronic cough with chronic sputum production with obstructive pattern on flow volume loops this is suggestive of mild COPD with chronic bronchitis in the setting of bilateral lower lobe bronchiectasis, overall her symptoms are stable however she has been using her albuterol inhaler more frequently on a daily basis  It seems that we need to change her inhaled steroid to a combination therapy inhaled steroid and long-acting beta agonist so that she may avoid her frequent albuterol use   1.chronic cough and chronic sputum production-c/w chronic bronchitis/COPD -will need to avoid second hand smoke exposure  2.MILD COPD with chronic Bronchitis -Stop Flovent Start Advair HFA will start at high dose at this point -Patient advised to use  albuterol 2-4  puffs every 4 hours as needed for intermittent wheezing  3.Bronchiectasis -will recommend flutter valve and incentive spirometry   Patient/Family are satisfied with Plan of action and management. All questions answered Follow up in 6 months for re-assessment  Corrin Parker, M.D.  [facility]  Pulmonary & Critical Care Medicine  Medical Director [place] Director [facility] Cardio-Pulmonary Department

## 2018-05-16 DIAGNOSIS — D225 Melanocytic nevi of trunk: Secondary | ICD-10-CM | POA: Diagnosis not present

## 2018-05-16 DIAGNOSIS — L905 Scar conditions and fibrosis of skin: Secondary | ICD-10-CM | POA: Diagnosis not present

## 2018-05-23 DIAGNOSIS — D225 Melanocytic nevi of trunk: Secondary | ICD-10-CM | POA: Diagnosis not present

## 2018-05-23 DIAGNOSIS — L988 Other specified disorders of the skin and subcutaneous tissue: Secondary | ICD-10-CM | POA: Diagnosis not present

## 2018-05-29 ENCOUNTER — Other Ambulatory Visit: Payer: Self-pay | Admitting: Internal Medicine

## 2018-05-30 DIAGNOSIS — D225 Melanocytic nevi of trunk: Secondary | ICD-10-CM | POA: Diagnosis not present

## 2018-05-31 ENCOUNTER — Other Ambulatory Visit: Payer: Self-pay | Admitting: Family Medicine

## 2018-05-31 DIAGNOSIS — J3089 Other allergic rhinitis: Secondary | ICD-10-CM

## 2018-06-13 ENCOUNTER — Encounter: Payer: Self-pay | Admitting: Family Medicine

## 2018-06-13 ENCOUNTER — Ambulatory Visit (INDEPENDENT_AMBULATORY_CARE_PROVIDER_SITE_OTHER): Payer: Medicare Other | Admitting: Family Medicine

## 2018-06-13 VITALS — BP 142/62 | HR 62 | Temp 98.0°F | Resp 16 | Wt 151.0 lb

## 2018-06-13 DIAGNOSIS — R0989 Other specified symptoms and signs involving the circulatory and respiratory systems: Secondary | ICD-10-CM | POA: Diagnosis not present

## 2018-06-13 DIAGNOSIS — I10 Essential (primary) hypertension: Secondary | ICD-10-CM

## 2018-06-13 DIAGNOSIS — E1121 Type 2 diabetes mellitus with diabetic nephropathy: Secondary | ICD-10-CM | POA: Diagnosis not present

## 2018-06-13 DIAGNOSIS — F0280 Dementia in other diseases classified elsewhere without behavioral disturbance: Secondary | ICD-10-CM

## 2018-06-13 DIAGNOSIS — E7800 Pure hypercholesterolemia, unspecified: Secondary | ICD-10-CM | POA: Diagnosis not present

## 2018-06-13 DIAGNOSIS — G3183 Dementia with Lewy bodies: Secondary | ICD-10-CM

## 2018-06-13 NOTE — Progress Notes (Signed)
Patient: [name] Female    DOB: Feb 01, 1929   84 y.o.   MRN: [id number] Visit Date: 06/13/2018  Today's Provider: Wilhemena Durie, MD   Chief Complaint  Patient presents with  . Hypertension   Subjective:    HPI  Hypertension, follow-up:  BP Readings from Last 3 Encounters:  06/13/18 (!) 142/62  05/12/18 128/68  02/13/18 (!) 140/52    She was last seen for hypertension 4 months ago.  BP at that visit was 140/52. Management since that visit includes stopping HCTZ. She reports good compliance with treatment. She is not having side effects. She is not exercising. She is adherent to low salt diet.   Outside blood pressures are checked occasionally. She is experiencing none.  Patient denies exertional chest pressure/discomfort, lower extremity edema and palpitations.   Cardiovascular risk factors include diabetes mellitus and dyslipidemia.   Weight trend: stable Wt Readings from Last 3 Encounters:  06/13/18 151 lb (68.5 kg)  05/12/18 148 lb (67.1 kg)  02/13/18 149 lb (67.6 kg)    Current diet: well balanced   Emotionally pt says she feels well.  Allergies  Allergen Reactions  . Neomycin-Bacitracin Zn-Polymyx Swelling  . Aricept [Donepezil Hcl] Diarrhea  . Benzalkonium Chloride Itching and Swelling  . Ibuprofen     Other reaction(s): Dizziness Heart fluttering. Tachycardia. Tachycardia.  . Valdecoxib Nausea And Vomiting  . Albuterol Rash  . Latex Rash  . Tape Rash    blisters  . Triamcinolone Rash     Current Outpatient Medications:  .  Ascorbic Acid (VITAMIN C) 1000 MG tablet, Take 1,000 mg by mouth daily. , Disp: , Rfl:  .  aspirin 325 MG tablet, Take 325 mg by mouth daily. Reported on 01/05/2016, Disp: , Rfl:  .  ASSURE COMFORT LANCETS 30G MISC, , Disp: , Rfl:  .  Blood Glucose Calibration (ACCU-CHEK SMARTVIEW CONTROL) LIQD, , Disp: , Rfl:  .  cetirizine (ZYRTEC) 10 MG tablet, TAKE 1 TABLET BY MOUTH DAILY, Disp: 90 tablet, Rfl:  3 .  diltiazem (CARDIZEM CD) 360 MG 24 hr capsule, Take 1 capsule (360 mg total) by mouth daily., Disp: 30 capsule, Rfl: 11 .  ezetimibe (ZETIA) 10 MG tablet, TAKE 1 TABLET BY MOUTH DAILY, Disp: 30 tablet, Rfl: 12 .  FLUoxetine (PROZAC) 40 MG capsule, TAKE 1 CAPSULE BY MOUTH EVERY DAY, Disp: 30 capsule, Rfl: 12 .  fluticasone-salmeterol (ADVAIR HFA) 230-21 MCG/ACT inhaler, Inhale 2 puffs into the lungs 2 (two) times daily., Disp: 1 Inhaler, Rfl: 12 .  glucose blood test strip, ACCU-CHEK AVIVA PLUS TEST STRP Use to check sugars 3 times daily., Disp: , Rfl:  .  hydrochlorothiazide (MICROZIDE) 12.5 MG capsule, Take 1 capsule (12.5 mg total) by mouth daily., Disp: 30 capsule, Rfl: 1 .  insulin glargine (LANTUS) 100 UNIT/ML injection, Inject 0.05 mLs (5 Units total) into the skin at bedtime. (Patient taking differently: Inject 10 Units into the skin at bedtime. ), Disp: 10 mL, Rfl: 11 .  insulin lispro (HUMALOG KWIKPEN) 100 UNIT/ML KiwkPen, Inject into the skin. 12 units in the am, 10 units at lunch and 12 units in the pm, Disp: , Rfl:  .  lansoprazole (PREVACID) 30 MG capsule, TAKE 1 CAPSULE BY MOUTH EVERY DAY AT NOON, Disp: 90 capsule, Rfl: 3 .  losartan (COZAAR) 100 MG tablet, Take 1 tablet (100 mg total) by mouth daily., Disp: 90 tablet, Rfl: 3 .  Magnesium 400 MG CAPS, Take by mouth daily.,  Disp: , Rfl:  .  memantine (NAMENDA) 5 MG tablet, Take 1 tablet (5 mg total) by mouth 2 (two) times daily., Disp: 60 tablet, Rfl: 12 .  montelukast (SINGULAIR) 10 MG tablet, TAKE ONE TABLET BY MOUTH AT BEDTIME, Disp: 90 tablet, Rfl: 3 .  benzonatate (TESSALON) 100 MG capsule, TAKE ONE CAPSULE THREE TIMES A DAY AS NEEDED FOR COUGH (Patient not taking: Reported on 06/13/2018), Disp: 90 capsule, Rfl: 2 .  fluticasone (FLOVENT HFA) 220 MCG/ACT inhaler, Inhale 2 puffs into the lungs 2 (two) times daily., Disp: 1 Inhaler, Rfl: 6 .  Multiple Vitamins-Minerals (ICAPS AREDS 2 PO), Take by mouth., Disp: , Rfl:  .  potassium  chloride (K-DUR) 10 MEQ tablet, TAKE 1 TABLET BY MOUTH TWICE DAILY, Disp: 180 tablet, Rfl: 3 .  PROAIR HFA 108 (90 Base) MCG/ACT inhaler, INHALE 2 PUFFS INTO THE LUNGS EVERY 4 HOURS AS NEEDED FOR WHEEZING ORSHORTNESS OF BREATH, Disp: 8.5 g, Rfl: 2 .  SURE COMFORT PEN NEEDLES 31G X 5 MM MISC, , Disp: , Rfl:  .  traZODone (DESYREL) 50 MG tablet, TAKE ONE AND A HALF TABLETS AT BEDTIME AS NEEDED FOR SLEEP, Disp: 45 tablet, Rfl: 11 .  vitamin E 1000 UNIT capsule, Take 1,000 Units by mouth daily., Disp: , Rfl:   Review of Systems  Constitutional: Negative for activity change, appetite change, chills, fatigue and fever.  HENT: Negative.   Eyes: Negative.   Respiratory: Negative for cough and shortness of breath.   Cardiovascular: Positive for leg swelling. Negative for chest pain and palpitations.  Endocrine: Negative for cold intolerance, heat intolerance, polydipsia, polyphagia and polyuria.  Musculoskeletal: Positive for arthralgias. Negative for back pain and gait problem.  Allergic/Immunologic: Negative.   Neurological: Negative for dizziness, light-headedness and headaches.  Psychiatric/Behavioral: Negative.     Social History   Tobacco Use  . Smoking status: Never Smoker  . Smokeless tobacco: Never Used  Substance Use Topics  . Alcohol use: No   Objective:   BP (!) 142/62 (BP Location: Left Arm, Patient Position: Sitting, Cuff Size: Normal)   Pulse 62   Temp 98 F (36.7 C)   Resp 16   Wt 151 lb (68.5 kg)   SpO2 98%   BMI 25.92 kg/m  Vitals:   06/13/18 1339  BP: (!) 142/62  Pulse: 62  Resp: 16  Temp: 98 F (36.7 C)  SpO2: 98%  Weight: 151 lb (68.5 kg)     Physical Exam  Constitutional: She is oriented to person, place, and time. She appears well-developed and well-nourished.  HENT:  Head: Normocephalic and atraumatic.  Right Ear: External ear normal.  Left Ear: External ear normal.  Nose: Nose normal.  Eyes: Conjunctivae are normal. Left eye exhibits no  discharge.  Neck: No thyromegaly present.  Cardiovascular: Normal rate, regular rhythm and normal heart sounds.  Pulmonary/Chest: Effort normal and breath sounds normal.  Abdominal: Soft.  Musculoskeletal: She exhibits no edema.  Neurological: She is alert and oriented to person, place, and time.  Skin: Skin is warm and dry.  Psychiatric: She has a normal mood and affect. Her behavior is normal. Judgment and thought content normal.        Assessment & Plan:     1. Benign essential HTN  - Comprehensive metabolic panel  2. Type 2 diabetes mellitus with diabetic nephropathy, without long-term current use of insulin [facility]) Per Endocrinology. - CBC with Differential/Platelet - Ambulatory referral to Ophthalmology  3. Hypercholesteremia  - Lipid panel -  TSH  4. Bilateral carotid bruits  - US Carotid Duplex Bilateral; Future  5.MCI/Lewy Body dementia Presently stable.     I have done the exam and reviewed the above chart and it is accurate to the best of my knowledge. Development worker, community has been used in this note in any air is in the dictation or transcription are unintentional.  Wilhemena Durie, MD  [place]

## 2018-06-14 ENCOUNTER — Telehealth: Payer: Self-pay | Admitting: Family Medicine

## 2018-06-14 NOTE — Telephone Encounter (Signed)
ok 

## 2018-06-14 NOTE — Telephone Encounter (Signed)
A referral has not been entered in [facility]

## 2018-06-14 NOTE — Telephone Encounter (Signed)
[name], I did not work with this patient but Dr Rosanna Randy said there is a referral to Dr Lucky Cowboy.  Please see note about calling daughter with the appointment information.  Thanks

## 2018-06-14 NOTE — Telephone Encounter (Signed)
Please review

## 2018-06-14 NOTE — Telephone Encounter (Signed)
Pt needing to know if Dr. Rosanna Randy has had a chance to talk with Dr. Lucky Cowboy about her appt she is needing. Please call daughter to make the appt for pt.  [name] [phone]  Pt wanted to let the office know she found her red white and blue SS card as well.  Please advise.  Thanks, [employer]

## 2018-06-14 NOTE — Telephone Encounter (Signed)
Sorry, I just found out that it is the carotid doppler they are talking about

## 2018-06-15 ENCOUNTER — Ambulatory Visit: Payer: Self-pay | Admitting: Family Medicine

## 2018-06-15 NOTE — Telephone Encounter (Signed)
[name]'s voicemail is full.Spoke with pt and gave her appointment for carotid ultrasound and contact information to reschedule if she needs to

## 2018-06-19 ENCOUNTER — Ambulatory Visit
Admission: RE | Admit: 2018-06-19 | Discharge: 2018-06-19 | Disposition: A | Discharge status: Home / Self Care | Payer: Medicare Other | Source: Ambulatory Visit | Attending: Family Medicine | Admitting: Family Medicine

## 2018-06-19 ENCOUNTER — Ambulatory Visit: Payer: Self-pay | Admitting: Family Medicine

## 2018-06-19 DIAGNOSIS — I6523 Occlusion and stenosis of bilateral carotid arteries: Secondary | ICD-10-CM | POA: Insufficient documentation

## 2018-06-19 DIAGNOSIS — R0989 Other specified symptoms and signs involving the circulatory and respiratory systems: Secondary | ICD-10-CM | POA: Insufficient documentation

## 2018-06-27 ENCOUNTER — Encounter: Payer: Self-pay | Admitting: Family Medicine

## 2018-06-27 ENCOUNTER — Ambulatory Visit (INDEPENDENT_AMBULATORY_CARE_PROVIDER_SITE_OTHER): Payer: Medicare Other | Admitting: Family Medicine

## 2018-06-27 VITALS — BP 142/70 | HR 74 | Temp 98.7°F | Resp 16 | Wt 149.0 lb

## 2018-06-27 DIAGNOSIS — Z23 Encounter for immunization: Secondary | ICD-10-CM

## 2018-06-27 DIAGNOSIS — S81811A Laceration without foreign body, right lower leg, initial encounter: Secondary | ICD-10-CM | POA: Diagnosis not present

## 2018-06-27 DIAGNOSIS — N898 Other specified noninflammatory disorders of vagina: Secondary | ICD-10-CM | POA: Diagnosis not present

## 2018-06-27 MED ORDER — TERCONAZOLE 0.4 % VA CREA: 1.0000 | TOPICAL_CREAM | Freq: Every day | VAGINAL | 0 refills | Status: DC

## 2018-06-27 MED ORDER — MUPIROCIN CALCIUM 2 % EX CREA: 1.0000 "application " | TOPICAL_CREAM | Freq: Two times a day (BID) | CUTANEOUS | 0 refills | Status: DC

## 2018-06-27 NOTE — Addendum Note (Signed)
Addended by: Wilburt Finlay L on: 06/27/2018 02:56 PM   Modules accepted: Orders

## 2018-06-27 NOTE — Progress Notes (Signed)
Patient: [name] Female    DOB: 1928/11/12   84 y.o.   MRN: [id number] Visit Date: 06/27/2018  Today's Provider: Wilhemena Durie, MD   Chief Complaint  Patient presents with  . Laceration   Subjective:    HPI Patient comes in today c/o a laceration on her lower right leg X 5 days ago. She reports that she slid out of her bathtub and her right leg scraped the bottom of the tub. She does have slight drainage and redness around her laceration. She is allergic to neosporin, so she has just covered the laceration with a bandage to prevent infection. Patient last tetanus was in 2006.     Allergies  Allergen Reactions  . Neomycin-Bacitracin Zn-Polymyx Swelling  . Aricept [Donepezil Hcl] Diarrhea  . Benzalkonium Chloride Itching and Swelling  . Ibuprofen     Other reaction(s): Dizziness Heart fluttering. Tachycardia. Tachycardia.  . Valdecoxib Nausea And Vomiting  . Albuterol Rash  . Latex Rash  . Tape Rash    blisters  . Triamcinolone Rash     Current Outpatient Medications:  .  Ascorbic Acid (VITAMIN C) 1000 MG tablet, Take 1,000 mg by mouth daily. , Disp: , Rfl:  .  aspirin 325 MG tablet, Take 325 mg by mouth daily. Reported on 01/05/2016, Disp: , Rfl:  .  ASSURE COMFORT LANCETS 30G MISC, , Disp: , Rfl:  .  benzonatate (TESSALON) 100 MG capsule, TAKE ONE CAPSULE THREE TIMES A DAY AS NEEDED FOR COUGH (Patient not taking: Reported on 06/13/2018), Disp: 90 capsule, Rfl: 2 .  Blood Glucose Calibration (ACCU-CHEK SMARTVIEW CONTROL) LIQD, , Disp: , Rfl:  .  cetirizine (ZYRTEC) 10 MG tablet, TAKE 1 TABLET BY MOUTH DAILY, Disp: 90 tablet, Rfl: 3 .  diltiazem (CARDIZEM CD) 360 MG 24 hr capsule, Take 1 capsule (360 mg total) by mouth daily., Disp: 30 capsule, Rfl: 11 .  ezetimibe (ZETIA) 10 MG tablet, TAKE 1 TABLET BY MOUTH DAILY, Disp: 30 tablet, Rfl: 12 .  FLUoxetine (PROZAC) 40 MG capsule, TAKE 1 CAPSULE BY MOUTH EVERY DAY, Disp: 30 capsule, Rfl: 12 .   fluticasone (FLOVENT HFA) 220 MCG/ACT inhaler, Inhale 2 puffs into the lungs 2 (two) times daily., Disp: 1 Inhaler, Rfl: 6 .  fluticasone-salmeterol (ADVAIR HFA) 230-21 MCG/ACT inhaler, Inhale 2 puffs into the lungs 2 (two) times daily., Disp: 1 Inhaler, Rfl: 12 .  glucose blood test strip, ACCU-CHEK AVIVA PLUS TEST STRP Use to check sugars 3 times daily., Disp: , Rfl:  .  hydrochlorothiazide (MICROZIDE) 12.5 MG capsule, Take 1 capsule (12.5 mg total) by mouth daily., Disp: 30 capsule, Rfl: 1 .  insulin glargine (LANTUS) 100 UNIT/ML injection, Inject 0.05 mLs (5 Units total) into the skin at bedtime. (Patient taking differently: Inject 10 Units into the skin at bedtime. ), Disp: 10 mL, Rfl: 11 .  insulin lispro (HUMALOG KWIKPEN) 100 UNIT/ML KiwkPen, Inject into the skin. 12 units in the am, 10 units at lunch and 12 units in the pm, Disp: , Rfl:  .  lansoprazole (PREVACID) 30 MG capsule, TAKE 1 CAPSULE BY MOUTH EVERY DAY AT NOON, Disp: 90 capsule, Rfl: 3 .  losartan (COZAAR) 100 MG tablet, Take 1 tablet (100 mg total) by mouth daily., Disp: 90 tablet, Rfl: 3 .  Magnesium 400 MG CAPS, Take by mouth daily., Disp: , Rfl:  .  memantine (NAMENDA) 5 MG tablet, Take 1 tablet (5 mg total) by mouth 2 (two) times daily.,  Disp: 60 tablet, Rfl: 12 .  montelukast (SINGULAIR) 10 MG tablet, TAKE ONE TABLET BY MOUTH AT BEDTIME, Disp: 90 tablet, Rfl: 3 .  Multiple Vitamins-Minerals (ICAPS AREDS 2 PO), Take by mouth., Disp: , Rfl:  .  mupirocin cream (BACTROBAN) 2 %, Apply 1 application topically 2 (two) times daily., Disp: 15 g, Rfl: 0 .  potassium chloride (K-DUR) 10 MEQ tablet, TAKE 1 TABLET BY MOUTH TWICE DAILY, Disp: 180 tablet, Rfl: 3 .  PROAIR HFA 108 (90 Base) MCG/ACT inhaler, INHALE 2 PUFFS INTO THE LUNGS EVERY 4 HOURS AS NEEDED FOR WHEEZING ORSHORTNESS OF BREATH, Disp: 8.5 g, Rfl: 2 .  SURE COMFORT PEN NEEDLES 31G X 5 MM MISC, , Disp: , Rfl:  .  traZODone (DESYREL) 50 MG tablet, TAKE ONE AND A HALF TABLETS AT  BEDTIME AS NEEDED FOR SLEEP, Disp: 45 tablet, Rfl: 11 .  vitamin E 1000 UNIT capsule, Take 1,000 Units by mouth daily., Disp: , Rfl:   Review of Systems  Constitutional: Negative for activity change, appetite change, chills, diaphoresis, fatigue, fever and unexpected weight change.  Eyes: Negative.   Respiratory: Negative.   Cardiovascular: Negative.   Musculoskeletal: Positive for myalgias. Negative for arthralgias, back pain, gait problem and joint swelling.  Skin: Positive for color change and wound. Negative for pallor and rash.  Allergic/Immunologic: Negative.   Hematological: Negative for adenopathy. Does not bruise/bleed easily.    Social History   Tobacco Use  . Smoking status: Never Smoker  . Smokeless tobacco: Never Used  Substance Use Topics  . Alcohol use: No   Objective:   BP (!) 142/70 (BP Location: Right Arm, Patient Position: Sitting, Cuff Size: Normal)   Pulse 74   Temp 98.7 F (37.1 C)   Resp 16   Wt 149 lb (67.6 kg)   SpO2 97%   BMI 25.58 kg/m  Vitals:   06/27/18 1049  BP: (!) 142/70  Pulse: 74  Resp: 16  Temp: 98.7 F (37.1 C)  SpO2: 97%  Weight: 149 lb (67.6 kg)     Physical Exam  Constitutional: She is oriented to person, place, and time. She appears well-developed and well-nourished.  HENT:  Head: Normocephalic and atraumatic.  Eyes: Conjunctivae are normal. No scleral icterus.  Neck: No thyromegaly present.  Cardiovascular: Normal rate and regular rhythm.  Pulmonary/Chest: Effort normal.  Musculoskeletal: She exhibits no edema.  Neurological: She is alert and oriented to person, place, and time.  Skin: Skin is warm and dry. Bruising and laceration noted.  Hand size flap has been pulled back on the patient's right anterior leg.  This is not infected.  Flap will not be viable as it has no blood supply.    Psychiatric: She has a normal mood and affect. Her behavior is normal. Judgment and thought content normal.        Assessment &  Plan:     1. Laceration of right lower extremity, initial encounter F/U in 1 week to reassess. Update Tetanus today.  - mupirocin cream (BACTROBAN) 2 %; Apply 1 application topically 2 (two) times daily.  Dispense: 15 g; Refill: 0 - Td : Tetanus/diphtheria >7yo Preservative  free  2. Flu vaccine need - Flu vaccine HIGH DOSE PF (Fluzone High dose)       I have done the exam and reviewed the above chart and it is accurate to the best of my knowledge. Development worker, community has been used in this note in any air is in the dictation or  transcription are unintentional.  Wilhemena Durie, MD  [facility]

## 2018-06-28 ENCOUNTER — Other Ambulatory Visit: Payer: Self-pay | Admitting: Family Medicine

## 2018-06-28 DIAGNOSIS — Z794 Long term (current) use of insulin: Secondary | ICD-10-CM | POA: Diagnosis not present

## 2018-06-28 DIAGNOSIS — N183 Chronic kidney disease, stage 3 (moderate): Secondary | ICD-10-CM | POA: Diagnosis not present

## 2018-06-28 DIAGNOSIS — E1122 Type 2 diabetes mellitus with diabetic chronic kidney disease: Secondary | ICD-10-CM | POA: Diagnosis not present

## 2018-07-04 ENCOUNTER — Ambulatory Visit (INDEPENDENT_AMBULATORY_CARE_PROVIDER_SITE_OTHER): Payer: Medicare Other | Admitting: Family Medicine

## 2018-07-04 VITALS — BP 154/62 | HR 77 | Temp 98.1°F | Resp 16 | Wt 152.0 lb

## 2018-07-04 DIAGNOSIS — J42 Unspecified chronic bronchitis: Secondary | ICD-10-CM

## 2018-07-04 DIAGNOSIS — S81811A Laceration without foreign body, right lower leg, initial encounter: Secondary | ICD-10-CM | POA: Diagnosis not present

## 2018-07-04 MED ORDER — DOXYCYCLINE HYCLATE 100 MG PO TABS: 100.0000 mg | ORAL_TABLET | Freq: Two times a day (BID) | ORAL | 0 refills | Status: DC

## 2018-07-04 MED ORDER — MUPIROCIN CALCIUM 2 % EX CREA: 1.0000 "application " | TOPICAL_CREAM | Freq: Two times a day (BID) | CUTANEOUS | 0 refills | Status: DC

## 2018-07-04 NOTE — Progress Notes (Signed)
[name]  MRN: [id number] DOB: Oct 18, 1928  Subjective:  HPI   The patient is an 84 year old female who presents for a 1 week follow up of her lower extremity laceration.  She was seen on 06/27/18 for a laceration to the right lower leg.  She states that she slid out of her bathtub and her leg scraped the bottom of the tub.  She was having some redness and drainage to the sight at the time of her last visit.   The patient reports that it is improving but still draining.   Patient Active Problem List   Diagnosis Date Noted  . Lewy body dementia ([place]) 02/17/2018  . PVD (peripheral vascular disease) ([place]) 08/16/2017  . Pain in limb 07/27/2016  . Hallucinations 07/07/2016  . Chest pain 07/01/2016  . Hypokalemia 09/03/2015  . Insomnia 07/23/2015  . Headache 07/23/2015  . Type 2 diabetes mellitus ([place]) 06/24/2015  . Chronic vulvitis 06/02/2015  . Allergic rhinitis 05/30/2015  . Back ache 05/30/2015  . Body mass index (BMI) of 29.0-29.9 in adult 05/30/2015  . Edema extremities 05/30/2015  . Dilatation of esophagus 05/30/2015  . Fall 05/30/2015  . H/O deep venous thrombosis 05/30/2015  . Hypercholesteremia 05/30/2015  . Heart & renal disease, hypertensive, with heart failure ([place]) 05/30/2015  . Below normal amount of sodium in the blood 05/30/2015  . Calculus of kidney 05/30/2015  . Gonalgia 05/30/2015  . Psoriasis 05/30/2015  . Avitaminosis D 05/30/2015  . Cough 04/01/2015  . Combined fat and carbohydrate induced hyperlipemia 12/30/2014  . Paroxysmal atrial fibrillation ([place]) 07/10/2014  . Abnormal gait 07/08/2014  . Anxiety state 07/08/2014  . Chronic kidney disease 07/08/2014  . Acid reflux 07/08/2014  . 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[place]) 07/08/2014  . Chronic obstructive pulmonary disease ([place]) 07/08/2014  . Benign essential HTN 06/26/2014  . Carotid artery narrowing 06/26/2014  . Intermittent claudication ([place]) 06/26/2014  . Basal cell carcinoma of face  05/01/2014  . Disease of female genital organs 11/28/2012  . Incomplete bladder emptying 09/05/2012  . Excessive urination at night 08/15/2012  . Basal cell carcinoma of ear 10/26/2011    Past Medical History:  Diagnosis Date  . Angina pectoris ([place])   . Atrial fibrillation ([place])   . Cervicalgia   . Chronic back pain   . Chronic obstructive pulmonary disease (COPD) ([place])   . COPD (chronic obstructive pulmonary disease) ([place])   . DDD (degenerative disc disease), lumbar   . Diabetes mellitus without complication ([place])   . Dysphagia   . Hyperlipemia   . Hypertension   . Vulvovaginitis     Social History   Socioeconomic History  . Marital status: Widowed    Spouse name: Not on file  . Number of children: 3  . Years of education: Not on file  . Highest education level: 8th grade  Occupational History  . Occupation: retired  Scientific laboratory technician  . Financial resource strain: Not hard at all  . Food insecurity:    Worry: Never true    Inability: Never true  . Transportation needs:    Medical: No    Non-medical: No  Tobacco Use  . Smoking status: Never Smoker  . Smokeless tobacco: Never Used  Substance and Sexual Activity  . Alcohol use: No  . Drug use: No  . Sexual activity: Never  Lifestyle  . Physical activity:    Days per week: Not on file    Minutes per session: Not on file  .  Stress: Only a little  Relationships  . Social connections:    Talks on phone: Not on file    Gets together: Not on file    Attends religious service: Not on file    Active member of club or organization: Not on file    Attends meetings of clubs or organizations: Not on file    Relationship status: Not on file  . Intimate partner violence:    Fear of current or ex partner: Not on file    Emotionally abused: Not on file    Physically abused: Not on file    Forced sexual activity: Not on file  Other Topics Concern  . Not on file  Social History Narrative  . Not on file    Outpatient  Encounter Medications as of 07/04/2018  Medication Sig  . Ascorbic Acid (VITAMIN C) 1000 MG tablet Take 1,000 mg by mouth daily.   Marland Kitchen aspirin 325 MG tablet Take 325 mg by mouth daily. Reported on 01/05/2016  . ASSURE COMFORT LANCETS 30G MISC   . benzonatate (TESSALON) 100 MG capsule TAKE ONE CAPSULE THREE TIMES A DAY AS NEEDED FOR COUGH  . Blood Glucose Calibration (ACCU-CHEK SMARTVIEW CONTROL) LIQD   . cetirizine (ZYRTEC) 10 MG tablet TAKE 1 TABLET BY MOUTH DAILY  . diltiazem (CARDIZEM CD) 360 MG 24 hr capsule Take 1 capsule (360 mg total) by mouth daily.  Marland Kitchen ezetimibe (ZETIA) 10 MG tablet TAKE 1 TABLET BY MOUTH DAILY  . FLUoxetine (PROZAC) 40 MG capsule TAKE 1 CAPSULE BY MOUTH EVERY DAY  . fluticasone-salmeterol (ADVAIR HFA) 230-21 MCG/ACT inhaler Inhale 2 puffs into the lungs 2 (two) times daily.  Marland Kitchen glucose blood test strip ACCU-CHEK AVIVA PLUS TEST STRP Use to check sugars 3 times daily.  . hydrochlorothiazide (MICROZIDE) 12.5 MG capsule Take 1 capsule (12.5 mg total) by mouth daily.  . insulin glargine (LANTUS) 100 UNIT/ML injection Inject 0.05 mLs (5 Units total) into the skin at bedtime. (Patient taking differently: Inject 10 Units into the skin at bedtime. )  . insulin lispro (HUMALOG KWIKPEN) 100 UNIT/ML KiwkPen Inject into the skin. 12 units in the am, 10 units at lunch and 12 units in the pm  . lansoprazole (PREVACID) 30 MG capsule TAKE 1 CAPSULE BY MOUTH EVERY DAY AT NOON  . losartan (COZAAR) 100 MG tablet TAKE ONE TABLET BY MOUTH EVERY DAY  . Magnesium 400 MG CAPS Take by mouth daily.  . memantine (NAMENDA) 5 MG tablet Take 1 tablet (5 mg total) by mouth 2 (two) times daily.  . montelukast (SINGULAIR) 10 MG tablet TAKE ONE TABLET BY MOUTH AT BEDTIME  . Multiple Vitamins-Minerals (ICAPS AREDS 2 PO) Take by mouth.  . mupirocin cream (BACTROBAN) 2 % Apply 1 application topically 2 (two) times daily.  . potassium chloride (K-DUR) 10 MEQ tablet TAKE 1 TABLET BY MOUTH TWICE DAILY  . PROAIR  HFA 108 (90 Base) MCG/ACT inhaler INHALE 2 PUFFS INTO THE LUNGS EVERY 4 HOURS AS NEEDED FOR WHEEZING ORSHORTNESS OF BREATH  . SURE COMFORT PEN NEEDLES 31G X 5 MM MISC   . terconazole (TERAZOL 7) 0.4 % vaginal cream Place 1 applicator vaginally at bedtime. For 3 days only.  . traZODone (DESYREL) 50 MG tablet TAKE ONE AND A HALF TABLETS AT BEDTIME AS NEEDED FOR SLEEP  . vitamin E 1000 UNIT capsule Take 1,000 Units by mouth daily.  . [DISCONTINUED] mupirocin cream (BACTROBAN) 2 % Apply 1 application topically 2 (two) times daily.  . fluticasone (  FLOVENT HFA) 220 MCG/ACT inhaler Inhale 2 puffs into the lungs 2 (two) times daily.   No facility-administered encounter medications on file as of 07/04/2018.     Allergies  Allergen Reactions  . Neomycin-Bacitracin Zn-Polymyx Swelling  . Aricept [Donepezil Hcl] Diarrhea  . Benzalkonium Chloride Itching and Swelling  . Ibuprofen     Other reaction(s): Dizziness Heart fluttering. Tachycardia. Tachycardia.  . Valdecoxib Nausea And Vomiting  . Albuterol Rash  . Latex Rash  . Tape Rash    blisters  . Triamcinolone Rash    Review of Systems  Constitutional: Negative for fever.  HENT: Positive for congestion. Negative for sinus pain and sore throat.   Eyes: Negative.   Respiratory: Positive for cough and shortness of breath. Negative for wheezing.   Cardiovascular: Positive for leg swelling. Negative for chest pain and palpitations.  Gastrointestinal: Negative.   Skin: Negative.   Endo/Heme/Allergies: Negative.   Psychiatric/Behavioral: Negative.     Objective:  BP (!) 154/62 (BP Location: Right Arm, Patient Position: Sitting, Cuff Size: Normal)   Pulse 77   Temp 98.1 F (36.7 C) (Oral)   Resp 16   Wt 152 lb (68.9 kg)   BMI 26.09 kg/m   Physical Exam  Constitutional: She is oriented to person, place, and time and well-developed, well-nourished, and in no distress.  HENT:  Head: Normocephalic and atraumatic.  Right Ear: External ear  normal.  Left Ear: External ear normal.  Nose: Nose normal.  Eyes: Conjunctivae are normal. No scleral icterus.  Neck: No thyromegaly present.  Cardiovascular: Normal rate, regular rhythm and normal heart sounds.  Pulmonary/Chest: Effort normal.  Musculoskeletal: She exhibits no edema.  Neurological: She is alert and oriented to person, place, and time. Gait normal. GCS score is 15.  Skin: Skin is warm and dry.  Wound 50% improved from last week.  Psychiatric: Mood, memory, affect and judgment normal.    Assessment and Plan :  1. Laceration of right lower extremity, initial encounter  - mupirocin cream (BACTROBAN) 2 %; Apply 1 application topically 2 (two) times daily.  Dispense: 30 g; Refill: 0  2. Chronic bronchitis, unspecified chronic bronchitis type (HCC)  - doxycycline (VIBRA-TABS) 100 MG tablet; Take 1 tablet (100 mg total) by mouth 2 (two) times daily.  Dispense: 10 tablet; Refill: 0 3.Chronic cough  I have done the exam and reviewed the chart and it is accurate to the best of my knowledge. Development worker, community has been used and  any errors in dictation or transcription are unintentional. Miguel Aschoff M.D. [facility]

## 2018-07-05 ENCOUNTER — Ambulatory Visit: Payer: Medicare Other

## 2018-07-06 ENCOUNTER — Telehealth: Payer: Self-pay | Admitting: Family Medicine

## 2018-07-06 NOTE — Telephone Encounter (Signed)
Pt is asking doctor to call her in cough syrup now since she is coughing.  She states she is taking the antibiotic.  Call into:  [place], [place] - [address] [phone] (Phone) [phone] (Fax)     Thanks, [facility]

## 2018-07-13 DIAGNOSIS — N183 Chronic kidney disease, stage 3 (moderate): Secondary | ICD-10-CM | POA: Diagnosis not present

## 2018-07-13 DIAGNOSIS — R609 Edema, unspecified: Secondary | ICD-10-CM | POA: Diagnosis not present

## 2018-07-13 DIAGNOSIS — E1159 Type 2 diabetes mellitus with other circulatory complications: Secondary | ICD-10-CM | POA: Diagnosis not present

## 2018-07-13 DIAGNOSIS — Z794 Long term (current) use of insulin: Secondary | ICD-10-CM | POA: Diagnosis not present

## 2018-07-13 DIAGNOSIS — E1122 Type 2 diabetes mellitus with diabetic chronic kidney disease: Secondary | ICD-10-CM | POA: Diagnosis not present

## 2018-07-23 ENCOUNTER — Other Ambulatory Visit: Payer: Self-pay | Admitting: Family Medicine

## 2018-07-31 DIAGNOSIS — H353132 Nonexudative age-related macular degeneration, bilateral, intermediate dry stage: Secondary | ICD-10-CM | POA: Diagnosis not present

## 2018-07-31 LAB — HM DIABETES EYE EXAM

## 2018-08-15 ENCOUNTER — Emergency Department
Admission: EM | Admit: 2018-08-15 | Discharge: 2018-08-15 | Disposition: A | Discharge status: Home / Self Care | Payer: Medicare Other | Attending: Emergency Medicine | Admitting: Emergency Medicine

## 2018-08-15 ENCOUNTER — Other Ambulatory Visit: Payer: Self-pay

## 2018-08-15 ENCOUNTER — Encounter: Payer: Self-pay | Admitting: Emergency Medicine

## 2018-08-15 ENCOUNTER — Telehealth: Payer: Self-pay | Admitting: *Deleted

## 2018-08-15 ENCOUNTER — Emergency Department: Payer: Medicare Other

## 2018-08-15 DIAGNOSIS — G3183 Dementia with Lewy bodies: Secondary | ICD-10-CM | POA: Insufficient documentation

## 2018-08-15 DIAGNOSIS — Z91040 Latex allergy status: Secondary | ICD-10-CM | POA: Diagnosis not present

## 2018-08-15 DIAGNOSIS — I129 Hypertensive chronic kidney disease with stage 1 through stage 4 chronic kidney disease, or unspecified chronic kidney disease: Secondary | ICD-10-CM | POA: Diagnosis not present

## 2018-08-15 DIAGNOSIS — N189 Chronic kidney disease, unspecified: Secondary | ICD-10-CM | POA: Insufficient documentation

## 2018-08-15 DIAGNOSIS — Z79899 Other long term (current) drug therapy: Secondary | ICD-10-CM | POA: Diagnosis not present

## 2018-08-15 DIAGNOSIS — Z794 Long term (current) use of insulin: Secondary | ICD-10-CM | POA: Diagnosis not present

## 2018-08-15 DIAGNOSIS — E1122 Type 2 diabetes mellitus with diabetic chronic kidney disease: Secondary | ICD-10-CM | POA: Insufficient documentation

## 2018-08-15 DIAGNOSIS — Z7982 Long term (current) use of aspirin: Secondary | ICD-10-CM | POA: Diagnosis not present

## 2018-08-15 DIAGNOSIS — M25551 Pain in right hip: Secondary | ICD-10-CM | POA: Diagnosis not present

## 2018-08-15 DIAGNOSIS — J449 Chronic obstructive pulmonary disease, unspecified: Secondary | ICD-10-CM | POA: Diagnosis not present

## 2018-08-15 DIAGNOSIS — M79661 Pain in right lower leg: Secondary | ICD-10-CM | POA: Diagnosis not present

## 2018-08-15 DIAGNOSIS — M79604 Pain in right leg: Secondary | ICD-10-CM

## 2018-08-15 MED ORDER — OXYCODONE HCL 5 MG PO TABS: 5.0000 mg | ORAL_TABLET | Freq: Once | ORAL | Status: DC

## 2018-08-15 MED ORDER — ACETAMINOPHEN 500 MG PO TABS
1000.0000 mg | ORAL_TABLET | Freq: Once | ORAL | Status: AC
  Administered 2018-08-15: 1000 mg via ORAL
  Filled 2018-08-15: qty 2

## 2018-08-15 NOTE — Telephone Encounter (Signed)
Please review

## 2018-08-15 NOTE — ED Provider Notes (Signed)
[facility] Emergency Department Provider Note  ____________________________________________  Time seen: Approximately 5:55 PM  I have reviewed the triage vital signs and the nursing notes.   HISTORY  Chief Complaint Leg Pain   HPI [name] is a 84 y.o. female who presents for evaluation of right lower extremity pain.  Patient reports that a month ago she had a fall sustaining a large skin tear on her right shin.  She recovered well from it and this morning when she woke up she started having new pain.  She reports that the pain is throbbing, mild to moderate, constant, located in the entire leg.  She feels that the pain starts in the proximal leg and radiates all the way down mostly on the posterior aspect of the leg.  She has stents in that leg.  She denies any other fall or trauma to the leg.  She does have swelling of both legs however that is chronic.  She denies any fever or chills, chest pain or shortness of breath.  She denies any prior history of DVT or PE.  Past Medical History:  Diagnosis Date  . Angina pectoris ([place])   . Atrial fibrillation ([place])   . Cervicalgia   . Chronic back pain   . Chronic obstructive pulmonary disease (COPD) ([place])   . COPD (chronic obstructive pulmonary disease) ([place])   . DDD (degenerative disc disease), lumbar   . Diabetes mellitus without complication ([place])   . Dysphagia   . Hyperlipemia   . Hypertension   . Vulvovaginitis     Patient Active Problem List   Diagnosis Date Noted  . Lewy body dementia ([place]) 02/17/2018  . PVD (peripheral vascular disease) ([place]) 08/16/2017  . Pain in limb 07/27/2016  . Hallucinations 07/07/2016  . Chest pain 07/01/2016  . Hypokalemia 09/03/2015  . Insomnia 07/23/2015  . Headache 07/23/2015  . Type 2 diabetes mellitus ([place]) 06/24/2015  . Chronic vulvitis 06/02/2015  . Allergic rhinitis 05/30/2015  . Back ache 05/30/2015  . Body mass index (BMI) of 29.0-29.9 in adult  05/30/2015  . Edema extremities 05/30/2015  . Dilatation of esophagus 05/30/2015  . Fall 05/30/2015  . H/O deep venous thrombosis 05/30/2015  . Hypercholesteremia 05/30/2015  . Heart & renal disease, hypertensive, with heart failure ([place]) 05/30/2015  . Below normal amount of sodium in the blood 05/30/2015  . Calculus of kidney 05/30/2015  . Gonalgia 05/30/2015  . Psoriasis 05/30/2015  . Avitaminosis D 05/30/2015  . Cough 04/01/2015  . Combined fat and carbohydrate induced hyperlipemia 12/30/2014  . Paroxysmal atrial fibrillation ([place]) 07/10/2014  . Abnormal gait 07/08/2014  . Anxiety state 07/08/2014  . Chronic kidney disease 07/08/2014  . Acid reflux 07/08/2014  . 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[place]) 07/08/2014  . Chronic obstructive pulmonary disease ([place]) 07/08/2014  . Benign essential HTN 06/26/2014  . Carotid artery narrowing 06/26/2014  . Intermittent claudication ([place]) 06/26/2014  . Basal cell carcinoma of face 05/01/2014  . Disease of female genital organs 11/28/2012  . Incomplete bladder emptying 09/05/2012  . Excessive urination at night 08/15/2012  . Basal cell carcinoma of ear 10/26/2011    Past Surgical History:  Procedure Laterality Date  . gall bladder    . melanoma     removal neck and back  . PERIPHERAL VASCULAR CATHETERIZATION Left 01/05/2016   Procedure: Lower Extremity Angiography;  Surgeon: Algernon Huxley, MD;  Location: [facility];  Service: Cardiovascular;  Laterality: Left;  . PERIPHERAL VASCULAR CATHETERIZATION  01/05/2016   Procedure: Lower Extremity Intervention;  Surgeon: Algernon Huxley, MD;  Location: [facility];  Service: Cardiovascular;;  . ureterolithiasis     calculus removed  . VAGINAL HYSTERECTOMY    . VULVA / PERINEUM BIOPSY  05/29/2015    Prior to Admission medications   Medication Sig Start Date End Date Taking? Authorizing Provider  Ascorbic Acid (VITAMIN C) 1000 MG tablet Take 1,000 mg by mouth daily.     [provider]  aspirin 325 MG tablet Take 325 mg by mouth daily. Reported on 01/05/2016    [provider]  ASSURE COMFORT LANCETS 30G [place]  08/01/15   [provider]  benzonatate (TESSALON) 100 MG capsule TAKE ONE CAPSULE THREE TIMES A DAY AS NEEDED FOR COUGH 05/02/18   Mar Daring, PA-C  Blood Glucose Calibration (ACCU-CHEK SMARTVIEW CONTROL) LIQD  07/30/15   [provider]  cetirizine (ZYRTEC) 10 MG tablet TAKE 1 TABLET BY MOUTH DAILY 02/10/17   Jerrol Banana., MD  diltiazem (CARDIZEM CD) 360 MG 24 hr capsule Take 1 capsule (360 mg total) by mouth daily. 11/03/17   Jerrol Banana., MD  doxycycline (VIBRA-TABS) 100 MG tablet Take 1 tablet (100 mg total) by mouth 2 (two) times daily. 07/04/18   Jerrol Banana., MD  ezetimibe (ZETIA) 10 MG tablet TAKE 1 TABLET BY MOUTH DAILY 03/27/18   Jerrol Banana., MD  FLUoxetine (PROZAC) 40 MG capsule TAKE 1 CAPSULE BY MOUTH EVERY DAY 07/23/18   Jerrol Banana., MD  fluticasone (FLOVENT HFA) 220 MCG/ACT inhaler Inhale 2 puffs into the lungs 2 (two) times daily. 05/19/17 05/19/18  Flora Lipps, MD  fluticasone-salmeterol (ADVAIR HFA) 578-46 MCG/ACT inhaler Inhale 2 puffs into the lungs 2 (two) times daily. 05/12/18   Flora Lipps, MD  glucose blood test strip ACCU-CHEK AVIVA PLUS TEST STRP Use to check sugars 3 times daily. 10/28/15   [provider]  hydrochlorothiazide (MICROZIDE) 12.5 MG capsule Take 1 capsule (12.5 mg total) by mouth daily. 01/30/18   Jerrol Banana., MD  insulin glargine (LANTUS) 100 UNIT/ML injection Inject 0.05 mLs (5 Units total) into the skin at bedtime. Patient taking differently: Inject 10 Units into the skin at bedtime.  07/02/16   Epifanio Lesches, MD  insulin lispro (HUMALOG KWIKPEN) 100 UNIT/ML KiwkPen Inject into the skin. 12 units in the am, 10 units at lunch and 12 units in the pm    [provider]  lansoprazole (PREVACID) 30 MG capsule TAKE  1 CAPSULE BY MOUTH EVERY DAY AT NOON 05/31/18   Jerrol Banana., MD  losartan (COZAAR) 100 MG tablet TAKE ONE TABLET BY MOUTH EVERY DAY 06/28/18   Jerrol Banana., MD  Magnesium 400 MG CAPS Take by mouth daily.    [provider]  memantine (NAMENDA) 5 MG tablet Take 1 tablet (5 mg total) by mouth 2 (two) times daily. 10/13/17   Jerrol Banana., MD  montelukast (SINGULAIR) 10 MG tablet TAKE ONE TABLET BY MOUTH AT BEDTIME 05/31/18   Jerrol Banana., MD  Multiple Vitamins-Minerals (ICAPS AREDS 2 PO) Take by mouth.    [provider]  mupirocin cream (BACTROBAN) 2 % Apply 1 application topically 2 (two) times daily. 07/04/18   Jerrol Banana., MD  potassium chloride (K-DUR) 10 MEQ tablet TAKE 1 TABLET BY MOUTH TWICE DAILY 03/27/18   Jerrol Banana., MD  [facility]  HFA 108 (90 Base) MCG/ACT inhaler INHALE 2 PUFFS INTO THE LUNGS EVERY 4 HOURS AS NEEDED FOR WHEEZING ORSHORTNESS OF BREATH 05/30/18   Flora Lipps, MD  SURE COMFORT PEN NEEDLES 31G X 5 MM MISC  07/30/15   [provider]  terconazole (TERAZOL 7) 0.4 % vaginal cream Place 1 applicator vaginally at bedtime. For 3 days only. 06/27/18   Jerrol Banana., MD  traZODone (DESYREL) 50 MG tablet TAKE ONE AND A HALF TABLETS AT BEDTIME AS NEEDED FOR SLEEP 08/08/17   Jerrol Banana., MD  vitamin E 1000 UNIT capsule Take 1,000 Units by mouth daily.    [provider]    Allergies Neomycin-bacitracin zn-polymyx; Aricept [donepezil hcl]; Benzalkonium chloride; Ibuprofen; Valdecoxib; Albuterol; Latex; Tape; and Triamcinolone  Family History  Problem Relation Age of Onset  . Ovarian cancer Other   . Diabetes Sister   . Colon cancer Brother   . Diabetes Brother   . Atrial fibrillation Daughter   . Breast cancer Neg Hx     Social History Social History   Tobacco Use  . Smoking status: Never Smoker  . Smokeless tobacco: Never Used  Substance Use Topics  . Alcohol use: No    . Drug use: No    Review of Systems  Constitutional: Negative for fever. Eyes: Negative for visual changes. ENT: Negative for sore throat. Neck: No neck pain  Cardiovascular: Negative for chest pain. Respiratory: Negative for shortness of breath. Gastrointestinal: Negative for abdominal pain, vomiting or diarrhea. Genitourinary: Negative for dysuria. Musculoskeletal: Negative for back pain. + RLE pain Skin: Negative for rash. Neurological: Negative for headaches, weakness or numbness. Psych: No SI or HI  ____________________________________________   PHYSICAL EXAM:  VITAL SIGNS: ED Triage Vitals [08/15/18 1703]  Enc Vitals Group     BP (!) 159/66     Pulse Rate 78     Resp 18     Temp 98.5 F (36.9 C)     Temp Source Oral     SpO2 98 %     Weight 148 lb (67.1 kg)     Height 5\' 2"  (1.575 m)     Head Circumference      Peak Flow      Pain Score      Pain Loc      Pain Edu?      Excl. in [place]?     Constitutional: Alert and oriented. Well appearing and in no apparent distress. HEENT:      Head: Normocephalic and atraumatic.         Eyes: Conjunctivae are normal. Sclera is non-icteric.       Mouth/Throat: Mucous membranes are moist.       Neck: Supple with no signs of meningismus. Cardiovascular: Regular rate and rhythm. No murmurs, gallops, or rubs. 2+ symmetrical distal pulses are present in all extremities. No JVD. Respiratory: Normal respiratory effort. Lungs are clear to auscultation bilaterally. No wheezes, crackles, or rhonchi.  Gastrointestinal: Soft, non tender, and non distended with positive bowel sounds. No rebound or guarding. Musculoskeletal: There is a well healed scar over the R shin region with no overlying warmth or erythema, mild bilateral pitting edema of the ankles, strong palpable DP and PT pulses, warm and well perfused extremities. Rotation of the R hip induces mild pain down the leg, no obvious deformities.  Neurologic: Normal speech and  language. Face is symmetric. Moving all extremities. No gross focal neurologic deficits are appreciated. Skin: Skin is warm,  dry and intact. No rash noted. Psychiatric: Mood and affect are normal. Speech and behavior are normal.  ____________________________________________   LABS (all labs ordered are listed, but only abnormal results are displayed)  Labs Reviewed - No data to display ____________________________________________  EKG  none  ____________________________________________  RADIOLOGY  I have personally reviewed the images performed during this visit and I agree with the Radiologist's read.   Interpretation by Radiologist:  US Venous Img Lower Unilateral Right  Result Date: 08/15/2018 CLINICAL DATA:  Right lower extremity pain for 1 month. EXAM: RIGHT LOWER EXTREMITY VENOUS DOPPLER ULTRASOUND TECHNIQUE: Gray-scale sonography with graded compression, as well as color Doppler and duplex ultrasound were performed to evaluate the lower extremity deep venous systems from the level of the common femoral vein and including the common femoral, femoral, profunda femoral, popliteal and calf veins including the posterior tibial, peroneal and gastrocnemius veins when visible. The superficial great saphenous vein was also interrogated. Spectral Doppler was utilized to evaluate flow at rest and with distal augmentation maneuvers in the common femoral, femoral and popliteal veins. COMPARISON:  None. FINDINGS: Contralateral Common Femoral Vein: Respiratory phasicity is normal and symmetric with the symptomatic side. No evidence of thrombus. Normal compressibility. Common Femoral Vein: No evidence of thrombus. Normal compressibility, respiratory phasicity and response to augmentation. Saphenofemoral Junction: No evidence of thrombus. Normal compressibility and flow on color Doppler imaging. Profunda Femoral Vein: No evidence of thrombus. Normal compressibility and flow on color Doppler imaging.  Femoral Vein: No evidence of thrombus. Normal compressibility, respiratory phasicity and response to augmentation. Popliteal Vein: No evidence of thrombus. Normal compressibility, respiratory phasicity and response to augmentation. Calf Veins: Peroneal veins were not visualized, however posterior tibial veins show no evidence of thrombus, with normal compressibility and flow on color Doppler imaging. Superficial Great Saphenous Vein: No evidence of thrombus. Normal compressibility. Venous Reflux:  None. Other Findings:  None. IMPRESSION: No evidence of deep venous thrombosis. Electronically Signed   By: Earle Gell M.D.   On: 08/15/2018 17:44   Dg Hip Unilat W Or Wo Pelvis 2-3 Views Right  Result Date: 08/15/2018 CLINICAL DATA:  Right hip pain after fall 1 month ago. EXAM: DG HIP (WITH OR WITHOUT PELVIS) 2-3V RIGHT COMPARISON:  None. FINDINGS: Degenerative disc disease L4-5 and L5-S1 with facet arthrosis at L5-S1 bilaterally. No pelvic diastasis or fracture. Mild joint space narrowing of both hips. No flattening of the femoral heads. Minimal spurring about the femoral head-neck juncture is bilaterally. Osteoarthritis of the pubic symphysis. Intact pubic rami. No proximal femoral fractures. Soft tissues are unremarkable. Pelvic phleboliths are noted bilaterally as well as vascular calcifications along the course of the external and internal iliacs. IMPRESSION: 1. No acute osseous abnormality of the bony pelvis and right hip. 2. Lower lumbar degenerative disc disease and facet arthrosis. 3. Osteoarthritis of the pubic symphysis and both hips. Electronically Signed   By: Ashley Royalty M.D.   On: 08/15/2018 18:33     ____________________________________________   PROCEDURES  Procedure(s) performed: None Procedures Critical Care performed:  None ____________________________________________   INITIAL IMPRESSION / ASSESSMENT AND PLAN / ED COURSE  84 y.o. female who presents for evaluation of right lower  extremity pain.  Leg has no deformities, warm and well-perfused with no signs of ischemia, strong palpable pulses bilaterally.  No signs of cellulitis.  No swelling of any joints.  Mild pain with internal rotation of the right hip.  Doppler study negative for DVT.  Pelvis and hip x-ray negative for  fracture dislocation.  No abdominal pain or back pain suspicious for dissection especially with strong intact distal pulses. Unclear etiology of patient's symptoms at this time.  Recommended continue Tylenol 1000 mg 3 times daily and close follow-up with primary care doctor if symptoms do not resolve in 24 hours.    _________________________ 6:44 PM on 08/15/2018 -----------------------------------------  XR negative, Patient able to ambulate steady gait. Will dc home on tylenol and f/u with PCP   As part of my medical decision making, I reviewed the following data within the [place] History obtained from family, Nursing notes reviewed and incorporated, Old chart reviewed, Radiograph reviewed , Notes from prior ED visits and [place] Controlled Substance Database    Pertinent labs & imaging results that were available during my care of the patient were reviewed by me and considered in my medical decision making (see chart for details).    ____________________________________________   FINAL CLINICAL IMPRESSION(S) / ED DIAGNOSES  Final diagnoses:  Right leg pain      NEW MEDICATIONS STARTED DURING THIS VISIT:  ED Discharge Orders    None       Note:  This document was prepared using Dragon voice recognition software and may include unintentional dictation errors.    Rudene Re, MD 08/15/18 [phone]

## 2018-08-15 NOTE — Telephone Encounter (Signed)
Patient called office stating her right leg started hurting this morning. Patient stated she has several stents in right leg. Patient stated she has taken Tylenol with no relief. Patient states pain is very bad. Patient wanted to know if she should go get her right leg x-rayed ASAP today, in case something is wrong? Please advise?

## 2018-08-15 NOTE — ED Triage Notes (Signed)
PT states she injured RT leg x75month ago and had skin tear, states has healed but not leg has become more painful and warm to touch. PT hx of stents placed in RLE for blood clots. NAD noted

## 2018-08-15 NOTE — Telephone Encounter (Signed)
She has to be seen by someone before any imaging can be ordered.

## 2018-08-15 NOTE — Discharge Instructions (Addendum)
Your ultrasound and x-ray did not reveal a reason for your pain.  Take 2 extra strength Tylenol every 8 hours for the pain. Follow up with your doctor in 2 days if your pain has not resolved or return to the emergency room if you have new or worsening pain, weakness, numbness.Marland Kitchen

## 2018-08-15 NOTE — Telephone Encounter (Signed)
Patient's daughter Thayer Headings called and stated patient has decided to go ahead to the ER this evening.

## 2018-08-16 ENCOUNTER — Telehealth: Payer: Self-pay | Admitting: Family Medicine

## 2018-08-16 NOTE — Telephone Encounter (Signed)
Please advise 

## 2018-08-16 NOTE — Telephone Encounter (Signed)
Pt went to ER due to pain in right leg and hip.  Diagnosed with arthritis. Pt asking if there is anything Dr. Rosanna Randy can recommend her to take.  In a lot of pain.   Please advise.  Thanks, [employer]

## 2018-08-17 ENCOUNTER — Ambulatory Visit (INDEPENDENT_AMBULATORY_CARE_PROVIDER_SITE_OTHER): Payer: Medicare Other | Admitting: Family Medicine

## 2018-08-17 ENCOUNTER — Encounter: Payer: Self-pay | Admitting: Family Medicine

## 2018-08-17 VITALS — BP 197/64 | HR 74 | Temp 98.2°F | Resp 16 | Wt 150.0 lb

## 2018-08-17 DIAGNOSIS — M5431 Sciatica, right side: Secondary | ICD-10-CM

## 2018-08-17 MED ORDER — HYDROCODONE-ACETAMINOPHEN 5-325 MG PO TABS: 1.0000 | ORAL_TABLET | Freq: Four times a day (QID) | ORAL | 0 refills | Status: AC | PRN

## 2018-08-17 MED ORDER — PREDNISONE 10 MG PO TABS: ORAL_TABLET | ORAL | 0 refills | Status: DC

## 2018-08-17 NOTE — Patient Instructions (Signed)
Let us know if not improving and we will refer you to an orthopedic doctor.

## 2018-08-17 NOTE — Telephone Encounter (Signed)
FYI

## 2018-08-17 NOTE — Telephone Encounter (Signed)
Pt scheduled appt with Mikki Santee today.

## 2018-08-17 NOTE — Progress Notes (Signed)
  Subjective:     Patient ID: [name], female   DOB: 05/01/1929, 84 y.o.   MRN: [id number] Chief Complaint  Patient presents with  . Leg Pain   HPI Was seen in the ER two days ago and felt to be having arthritic pain in her hip and back. She reports pain starts in her right buttock area and radiates down the back of her leg. Reports it is present with both w.b.and n.w.b. Accompanied by her son today.  Review of Systems     Objective:   Physical Exam  Constitutional: She appears well-developed and well-nourished. She appears distressed (moderate pain ).  Musculoskeletal:  Muscle strength in lower extremities 5/5. SLR's to 90 degrees with increased pain in her ischial and thigh. Tender over her ischial area       Assessment:    1. Sciatica of right side: prednisone taper and hydrocodone prn.    Plan:    Call if not improving. Come in for nurse bp check when leg feeling better.

## 2018-08-18 ENCOUNTER — Telehealth: Payer: Self-pay

## 2018-08-18 NOTE — Telephone Encounter (Signed)
Patient called wanting to know if it is ok to take extra strength Tylenol along with Norco for a migraine? Please advise. Contact number is correct. Thanks!

## 2018-08-18 NOTE — Telephone Encounter (Signed)
Please advise. KW 

## 2018-08-18 NOTE — Telephone Encounter (Signed)
Ok as long as the total dose of Tylenol for the day does not exceed 3000 mg. Remember there is tylenol combined with the hydrocodone in [place].

## 2018-08-18 NOTE — Telephone Encounter (Signed)
Pt.advised.KW 

## 2018-08-23 ENCOUNTER — Emergency Department
Admission: EM | Admit: 2018-08-23 | Discharge: 2018-08-23 | Disposition: A | Discharge status: Home / Self Care | Payer: Medicare Other | Attending: Emergency Medicine | Admitting: Emergency Medicine

## 2018-08-23 ENCOUNTER — Encounter: Payer: Self-pay | Admitting: Emergency Medicine

## 2018-08-23 ENCOUNTER — Emergency Department: Payer: Medicare Other

## 2018-08-23 ENCOUNTER — Other Ambulatory Visit: Payer: Self-pay

## 2018-08-23 DIAGNOSIS — Z7982 Long term (current) use of aspirin: Secondary | ICD-10-CM | POA: Insufficient documentation

## 2018-08-23 DIAGNOSIS — E785 Hyperlipidemia, unspecified: Secondary | ICD-10-CM | POA: Diagnosis not present

## 2018-08-23 DIAGNOSIS — E119 Type 2 diabetes mellitus without complications: Secondary | ICD-10-CM | POA: Diagnosis not present

## 2018-08-23 DIAGNOSIS — J449 Chronic obstructive pulmonary disease, unspecified: Secondary | ICD-10-CM | POA: Insufficient documentation

## 2018-08-23 DIAGNOSIS — Z91040 Latex allergy status: Secondary | ICD-10-CM | POA: Diagnosis not present

## 2018-08-23 DIAGNOSIS — Z79899 Other long term (current) drug therapy: Secondary | ICD-10-CM | POA: Insufficient documentation

## 2018-08-23 DIAGNOSIS — E7800 Pure hypercholesterolemia, unspecified: Secondary | ICD-10-CM | POA: Insufficient documentation

## 2018-08-23 DIAGNOSIS — I10 Essential (primary) hypertension: Secondary | ICD-10-CM | POA: Diagnosis not present

## 2018-08-23 DIAGNOSIS — I480 Paroxysmal atrial fibrillation: Secondary | ICD-10-CM | POA: Diagnosis not present

## 2018-08-23 DIAGNOSIS — M25551 Pain in right hip: Secondary | ICD-10-CM | POA: Diagnosis not present

## 2018-08-23 DIAGNOSIS — Z794 Long term (current) use of insulin: Secondary | ICD-10-CM | POA: Diagnosis not present

## 2018-08-23 LAB — HEPATIC FUNCTION PANEL
ALT: 36 U/L (ref 0–44)
AST: 39 U/L (ref 15–41)
Albumin: 3.8 g/dL (ref 3.5–5.0)
Alkaline Phosphatase: 89 U/L (ref 38–126)
Bilirubin, Direct: 0.2 mg/dL (ref 0.0–0.2)
Indirect Bilirubin: 0.7 mg/dL (ref 0.3–0.9)
Total Bilirubin: 0.9 mg/dL (ref 0.3–1.2)
Total Protein: 6.9 g/dL (ref 6.5–8.1)

## 2018-08-23 LAB — URINALYSIS, COMPLETE (UACMP) WITH MICROSCOPIC
Bilirubin Urine: NEGATIVE
Glucose, UA: 50 mg/dL — AB
Hgb urine dipstick: NEGATIVE
Ketones, ur: NEGATIVE mg/dL
Leukocytes, UA: NEGATIVE
Nitrite: NEGATIVE
Protein, ur: >=300 mg/dL — AB
Specific Gravity, Urine: 1.01 (ref 1.005–1.030)
pH: 8 (ref 5.0–8.0)

## 2018-08-23 LAB — BASIC METABOLIC PANEL WITH GFR
Anion gap: 10 (ref 5–15)
BUN: 15 mg/dL (ref 8–23)
CO2: 32 mmol/L (ref 22–32)
Calcium: 8.7 mg/dL — ABNORMAL LOW (ref 8.9–10.3)
Chloride: 92 mmol/L — ABNORMAL LOW (ref 98–111)
Creatinine, Ser: 0.78 mg/dL (ref 0.44–1.00)
GFR calc Af Amer: >60 mL/min
GFR calc non Af Amer: >60 mL/min
Glucose, Bld: 189 mg/dL — ABNORMAL HIGH (ref 70–99)
Potassium: 3.8 mmol/L (ref 3.5–5.1)
Sodium: 134 mmol/L — ABNORMAL LOW (ref 135–145)

## 2018-08-23 LAB — CBC WITH DIFFERENTIAL/PLATELET
Abs Immature Granulocytes: 0.11 10*3/uL — ABNORMAL HIGH (ref 0.00–0.07)
BASOS ABS: 0.1 10*3/uL (ref 0.0–0.1)
Basophils Relative: 1 %
EOS ABS: 0.2 10*3/uL (ref 0.0–0.5)
EOS PCT: 2 %
HCT: 41.9 % (ref 36.0–46.0)
Hemoglobin: 14.3 g/dL (ref 12.0–15.0)
IMMATURE GRANULOCYTES: 1 %
LYMPHS PCT: 18 %
Lymphs Abs: 2.5 10*3/uL (ref 0.7–4.0)
MCH: 28.8 pg (ref 26.0–34.0)
MCHC: 34.1 g/dL (ref 30.0–36.0)
MCV: 84.5 fL (ref 80.0–100.0)
Monocytes Absolute: 1.7 10*3/uL — ABNORMAL HIGH (ref 0.1–1.0)
Monocytes Relative: 13 %
NEUTROS PCT: 65 %
Neutro Abs: 9.2 10*3/uL — ABNORMAL HIGH (ref 1.7–7.7)
Platelets: 261 10*3/uL (ref 150–400)
RBC: 4.96 MIL/uL (ref 3.87–5.11)
RDW: 13.2 % (ref 11.5–15.5)
WBC: 13.8 10*3/uL — AB (ref 4.0–10.5)
nRBC: 0 % (ref 0.0–0.2)

## 2018-08-23 MED ORDER — LIDOCAINE 5 % EX PTCH
1.0000 | MEDICATED_PATCH | CUTANEOUS | Status: DC
  Administered 2018-08-23: 1 via TRANSDERMAL
  Filled 2018-08-23: qty 1

## 2018-08-23 MED ORDER — LOSARTAN POTASSIUM 50 MG PO TABS
100.0000 mg | ORAL_TABLET | Freq: Every day | ORAL | Status: DC
  Administered 2018-08-23: 100 mg via ORAL
  Filled 2018-08-23: qty 2

## 2018-08-23 MED ORDER — ONDANSETRON HCL 4 MG/2ML IJ SOLN
4.0000 mg | Freq: Once | INTRAMUSCULAR | Status: AC
  Administered 2018-08-23: 4 mg via INTRAVENOUS
  Filled 2018-08-23: qty 2

## 2018-08-23 MED ORDER — INSULIN GLARGINE 100 UNIT/ML ~~LOC~~ SOLN
7.0000 [IU] | Freq: Every day | SUBCUTANEOUS | Status: DC
  Filled 2018-08-23: qty 0.07

## 2018-08-23 MED ORDER — MORPHINE SULFATE (PF) 2 MG/ML IV SOLN
2.0000 mg | Freq: Once | INTRAVENOUS | Status: AC
  Administered 2018-08-23: 2 mg via INTRAVENOUS
  Filled 2018-08-23: qty 1

## 2018-08-23 MED ORDER — ASPIRIN 325 MG PO TABS
325.0000 mg | ORAL_TABLET | Freq: Every day | ORAL | Status: DC
  Filled 2018-08-23: qty 1

## 2018-08-23 MED ORDER — HYDROCODONE-ACETAMINOPHEN 5-325 MG PO TABS
1.0000 | ORAL_TABLET | ORAL | Status: DC | PRN
  Administered 2018-08-23: 1 via ORAL
  Filled 2018-08-23: qty 1

## 2018-08-23 MED ORDER — LINAGLIPTIN 5 MG PO TABS
5.0000 mg | ORAL_TABLET | Freq: Every day | ORAL | Status: DC
  Filled 2018-08-23: qty 1

## 2018-08-23 MED ORDER — POTASSIUM CHLORIDE CRYS ER 10 MEQ PO TBCR
10.0000 meq | EXTENDED_RELEASE_TABLET | Freq: Two times a day (BID) | ORAL | Status: DC
  Filled 2018-08-23 (×2): qty 1

## 2018-08-23 MED ORDER — HYDROCODONE-ACETAMINOPHEN 5-325 MG PO TABS: 1.0000 | ORAL_TABLET | ORAL | 0 refills | Status: DC | PRN

## 2018-08-23 MED ORDER — TRAMADOL HCL 50 MG PO TABS
50.0000 mg | ORAL_TABLET | Freq: Once | ORAL | Status: AC
  Administered 2018-08-23: 50 mg via ORAL
  Filled 2018-08-23: qty 1

## 2018-08-23 MED ORDER — EZETIMIBE 10 MG PO TABS
10.0000 mg | ORAL_TABLET | Freq: Every day | ORAL | Status: DC
  Filled 2018-08-23: qty 1

## 2018-08-23 NOTE — Care Management Note (Signed)
Case Management Note  Patient Details  Name: [name] MRN: [id number] Date of Birth: 03-Mar-1929  Subjective/Objective:       Patient is being seen in the ED for hip pain.  Patient is from home lives alone, has a walker and a cane that she uses.  Patient also has a shower chair with a back and  grab bars in the bathroom that her son installed.  Patient has 3 children, son Juanita Streight at the bedside.  PCP veriifed as Dr. Rosanna Randy, last seen 2 weeks ago.  Pharmacy is [facility].  Patient drives occasionally but not for long distances- her children are available for transportation if necessary.  Address verified as correct in Epic.  PT evaluated patient in the ED and recommends HHPT.  Patient and son would like to have [facility] PT- choice offered- patient has no preference- Drue Novel with [facility] contacted and they will accept the patient but she cannot be seen until after Thanksgiving.   Brad with [facility] states that they can take the patient and should be able to come out and see over the weekend.  Patient and son choose [facility].   PT also recommends a 3 in 1- pt declines- she reports she has a elevated commode and does not need a bedside commode. Doran Clay RN BSN Care Management [phone]                Action/Plan:  Patient discharged home with [place]  Expected Discharge Date:                  Expected Discharge Plan:  [place]  In-House Referral:  Clinical Social Work  Discharge planning Services  CM Consult  Post Acute Care Choice:  Home Health Choice offered to:  Patient, Adult Children  DME Arranged:    DME Agency:     HH Arranged:  PT [place]:  [facility]now [facility])  Status of Service:  Completed, signed off  If discussed at [employer] of Stay Meetings, dates discussed:    Additional Comments:  Shelbie Hutching, RN 08/23/2018, 11:10 AM

## 2018-08-23 NOTE — ED Triage Notes (Signed)
Pt to triage via w/c with no distress noted; reports seen recently for right hip pain radiating down leg with negative findings but cont to have pain

## 2018-08-23 NOTE — ED Provider Notes (Signed)
[facility] Emergency Department Provider Note   ____________________________________________   First MD Initiated Contact with Patient 08/23/18 [phone]     (approximate)  I have reviewed the triage vital signs and the nursing notes.   HISTORY  Chief Complaint Hip Pain    HPI [name] is a 84 y.o. female who presents to the ED from home with a chief complaint of persistent right hip and buttock pain.  Patient was seen in the ED on 11/19 for same; following a fall 4 weeks prior.  Had negative x-rays.  Seen by her PCP 1 week ago and felt to have sciatica.  Placed on Norco and prednisone taper without relief of symptoms.  Patient denies back pain.  Complains of pain in her right buttock which radiates down her hamstrings into her knee.  Painful to ambulate and range her hip.  Denies extremity weakness, numbness or tingling.  Denies bowel or bladder incontinence.  Denies associated fever, chills, chest pain, shortness of breath, abdominal pain, vomiting.  Endorses dysuria and nausea.   Past Medical History:  Diagnosis Date  . Angina pectoris ([place])   . Atrial fibrillation ([place])   . Cervicalgia   . Chronic back pain   . Chronic obstructive pulmonary disease (COPD) ([place])   . COPD (chronic obstructive pulmonary disease) ([place])   . DDD (degenerative disc disease), lumbar   . Diabetes mellitus without complication ([place])   . Dysphagia   . Hyperlipemia   . Hypertension   . Vulvovaginitis     Patient Active Problem List   Diagnosis Date Noted  . Lewy body dementia ([place]) 02/17/2018  . PVD (peripheral vascular disease) ([place]) 08/16/2017  . Pain in limb 07/27/2016  . Hallucinations 07/07/2016  . Chest pain 07/01/2016  . Hypokalemia 09/03/2015  . Insomnia 07/23/2015  . Headache 07/23/2015  . Type 2 diabetes mellitus ([place]) 06/24/2015  . Chronic vulvitis 06/02/2015  . Allergic rhinitis 05/30/2015  . Back ache 05/30/2015  . Body mass index (BMI) of  29.0-29.9 in adult 05/30/2015  . Edema extremities 05/30/2015  . Dilatation of esophagus 05/30/2015  . Fall 05/30/2015  . H/O deep venous thrombosis 05/30/2015  . Hypercholesteremia 05/30/2015  . Heart & renal disease, hypertensive, with heart failure ([place]) 05/30/2015  . Below normal amount of sodium in the blood 05/30/2015  . Calculus of kidney 05/30/2015  . Gonalgia 05/30/2015  . Psoriasis 05/30/2015  . Avitaminosis D 05/30/2015  . Cough 04/01/2015  . Combined fat and carbohydrate induced hyperlipemia 12/30/2014  . Paroxysmal atrial fibrillation ([place]) 07/10/2014  . Abnormal gait 07/08/2014  . Anxiety state 07/08/2014  . Chronic kidney disease 07/08/2014  . Acid reflux 07/08/2014  . 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[place]) 07/08/2014  . Chronic obstructive pulmonary disease ([place]) 07/08/2014  . Benign essential HTN 06/26/2014  . Carotid artery narrowing 06/26/2014  . Intermittent claudication ([place]) 06/26/2014  . Basal cell carcinoma of face 05/01/2014  . Disease of female genital organs 11/28/2012  . Incomplete bladder emptying 09/05/2012  . Excessive urination at night 08/15/2012  . Basal cell carcinoma of ear 10/26/2011    Past Surgical History:  Procedure Laterality Date  . gall bladder    . melanoma     removal neck and back  . PERIPHERAL VASCULAR CATHETERIZATION Left 01/05/2016   Procedure: Lower Extremity Angiography;  Surgeon: Algernon Huxley, MD;  Location: [place] [facility];  Service: Cardiovascular;  Laterality: Left;  . PERIPHERAL VASCULAR CATHETERIZATION  01/05/2016   Procedure: Lower Extremity  Intervention;  Surgeon: Algernon Huxley, MD;  Location: [place] [facility];  Service: Cardiovascular;;  . ureterolithiasis     calculus removed  . VAGINAL HYSTERECTOMY    . VULVA / PERINEUM BIOPSY  05/29/2015    Prior to Admission medications   Medication Sig Start Date End Date Taking? Authorizing Provider  Ascorbic Acid (VITAMIN C) 1000 MG tablet Take 1,000 mg by mouth daily.      [provider]  aspirin 325 MG tablet Take 325 mg by mouth daily. Reported on 01/05/2016    [provider]  ASSURE COMFORT LANCETS 30G [place]  08/01/15   [provider]  benzonatate (TESSALON) 100 MG capsule TAKE ONE CAPSULE THREE TIMES A DAY AS NEEDED FOR COUGH 05/02/18   Mar Daring, PA-C  Blood Glucose Calibration (ACCU-CHEK SMARTVIEW CONTROL) LIQD  07/30/15   [provider]  cetirizine (ZYRTEC) 10 MG tablet TAKE 1 TABLET BY MOUTH DAILY 02/10/17   Jerrol Banana., MD  diltiazem (CARDIZEM CD) 360 MG 24 hr capsule Take 1 capsule (360 mg total) by mouth daily. 11/03/17   Jerrol Banana., MD  doxycycline (VIBRA-TABS) 100 MG tablet Take 1 tablet (100 mg total) by mouth 2 (two) times daily. 07/04/18   Jerrol Banana., MD  ezetimibe (ZETIA) 10 MG tablet TAKE 1 TABLET BY MOUTH DAILY 03/27/18   Jerrol Banana., MD  FLUoxetine (PROZAC) 40 MG capsule TAKE 1 CAPSULE BY MOUTH EVERY DAY 07/23/18   Jerrol Banana., MD  fluticasone (FLOVENT HFA) 220 MCG/ACT inhaler Inhale 2 puffs into the lungs 2 (two) times daily. 05/19/17 05/19/18  Flora Lipps, MD  fluticasone-salmeterol (ADVAIR HFA) 053-97 MCG/ACT inhaler Inhale 2 puffs into the lungs 2 (two) times daily. 05/12/18   Flora Lipps, MD  glucose blood test strip ACCU-CHEK AVIVA PLUS TEST STRP Use to check sugars 3 times daily. 10/28/15   [provider]  hydrochlorothiazide (MICROZIDE) 12.5 MG capsule Take 1 capsule (12.5 mg total) by mouth daily. 01/30/18   Jerrol Banana., MD  insulin glargine (LANTUS) 100 UNIT/ML injection Inject 0.05 mLs (5 Units total) into the skin at bedtime. Patient taking differently: Inject 10 Units into the skin at bedtime.  07/02/16   Epifanio Lesches, MD  insulin lispro (HUMALOG KWIKPEN) 100 UNIT/ML KiwkPen Inject into the skin. 12 units in the am, 10 units at lunch and 12 units in the pm    [provider]  lansoprazole (PREVACID) 30 MG  capsule TAKE 1 CAPSULE BY MOUTH EVERY DAY AT NOON 05/31/18   Jerrol Banana., MD  losartan (COZAAR) 100 MG tablet TAKE ONE TABLET BY MOUTH EVERY DAY 06/28/18   Jerrol Banana., MD  Magnesium 400 MG CAPS Take by mouth daily.    [provider]  memantine (NAMENDA) 5 MG tablet Take 1 tablet (5 mg total) by mouth 2 (two) times daily. 10/13/17   Jerrol Banana., MD  montelukast (SINGULAIR) 10 MG tablet TAKE ONE TABLET BY MOUTH AT BEDTIME 05/31/18   Jerrol Banana., MD  Multiple Vitamins-Minerals (ICAPS AREDS 2 PO) Take by mouth.    [provider]  mupirocin cream (BACTROBAN) 2 % Apply 1 application topically 2 (two) times daily. 07/04/18   Jerrol Banana., MD  potassium chloride (K-DUR) 10 MEQ tablet TAKE 1 TABLET BY MOUTH TWICE DAILY 03/27/18   Jerrol Banana., MD  predniSONE (DELTASONE) 10 MG tablet Taper daily  as follows: 6 pills, 5, 4, 3, 2, 1 08/17/18   Chauvin, Robert, PA  PROAIR HFA 108 (90 Base) MCG/ACT inhaler INHALE 2 PUFFS INTO THE LUNGS EVERY 4 HOURS AS NEEDED FOR WHEEZING ORSHORTNESS OF BREATH 05/30/18   Flora Lipps, MD  SURE COMFORT PEN NEEDLES 31G X 5 MM MISC  07/30/15   [provider]  terconazole (TERAZOL 7) 0.4 % vaginal cream Place 1 applicator vaginally at bedtime. For 3 days only. 06/27/18   Jerrol Banana., MD  traZODone (DESYREL) 50 MG tablet TAKE ONE AND A HALF TABLETS AT BEDTIME AS NEEDED FOR SLEEP 08/08/17   Jerrol Banana., MD  vitamin E 1000 UNIT capsule Take 1,000 Units by mouth daily.    [provider]    Allergies Neomycin-bacitracin zn-polymyx; Aricept [donepezil hcl]; Benzalkonium chloride; Ibuprofen; Valdecoxib; Albuterol; Latex; Tape; and Triamcinolone  Family History  Problem Relation Age of Onset  . Ovarian cancer Other   . Diabetes Sister   . Colon cancer Brother   . Diabetes Brother   . Atrial fibrillation Daughter   . Breast cancer Neg Hx     Social History Social  History   Tobacco Use  . Smoking status: Never Smoker  . Smokeless tobacco: Never Used  Substance Use Topics  . Alcohol use: No  . Drug use: No    Review of Systems  Constitutional: No fever/chills Eyes: No visual changes. ENT: No sore throat. Cardiovascular: Denies chest pain. Respiratory: Denies shortness of breath. Gastrointestinal: No abdominal pain.  No nausea, no vomiting.  No diarrhea.  No constipation. Genitourinary: Negative for dysuria. Musculoskeletal: Positive for right hip pain.  Negative for back pain. Skin: Negative for rash. Neurological: Negative for headaches, focal weakness or numbness.   ____________________________________________   PHYSICAL EXAM:  VITAL SIGNS: ED Triage Vitals  Enc Vitals Group     BP 08/23/18 0336 (!) 181/89     Pulse Rate 08/23/18 0336 89     Resp 08/23/18 0336 18     Temp 08/23/18 0336 98.8 F (37.1 C)     Temp Source 08/23/18 0336 Oral     SpO2 08/23/18 0336 95 %     Weight 08/23/18 0335 148 lb (67.1 kg)     Height 08/23/18 0335 5\' 2"  (1.575 m)     Head Circumference --      Peak Flow --      Pain Score 08/23/18 0335 10     Pain Loc --      Pain Edu? --      Excl. in [place]? --     Constitutional: Alert and oriented. Well appearing and in mild acute distress. Eyes: Conjunctivae are normal. PERRL. EOMI. Head: Atraumatic. Nose: No congestion/rhinnorhea. Mouth/Throat: Mucous membranes are moist.  Oropharynx non-erythematous. Neck: No stridor.  No cervical spine tenderness to palpation. Cardiovascular: Normal rate, regular rhythm. Grossly normal heart sounds.  Good peripheral circulation. Respiratory: Normal respiratory effort.  No retractions. Lungs CTAB. Gastrointestinal: Soft and nontender. No distention. No abdominal bruits. No CVA tenderness. Musculoskeletal: No spinal tenderness to palpation.  Pelvis stable.  Right buttock tender to palpation.  Right hip tender to palpation.  Limited range of motion secondary to pain.   No calf swelling or tenderness.  2+ distal pulses.  No pedal edema.  No joint effusions. Neurologic:  Normal speech and language. No gross focal neurologic deficits are appreciated.  Skin:  Skin is warm, dry and intact. No rash noted. Psychiatric: Mood and affect are  normal. Speech and behavior are normal.  ____________________________________________   LABS (all labs ordered are listed, but only abnormal results are displayed)  Labs Reviewed  CBC WITH DIFFERENTIAL/PLATELET - Abnormal; Notable for the following components:      Result Value   WBC 13.8 (*)    Neutro Abs 9.2 (*)    Monocytes Absolute 1.7 (*)    Abs Immature Granulocytes 0.11 (*)    All other components within normal limits  BASIC METABOLIC PANEL - Abnormal; Notable for the following components:   Sodium 134 (*)    Chloride 92 (*)    Glucose, Bld 189 (*)    Calcium 8.7 (*)    All other components within normal limits  URINALYSIS, COMPLETE (UACMP) WITH MICROSCOPIC - Abnormal; Notable for the following components:   Color, Urine STRAW (*)    APPearance CLEAR (*)    Glucose, UA 50 (*)    Protein, ur >=300 (*)    Bacteria, UA RARE (*)    All other components within normal limits   ____________________________________________  EKG  None ____________________________________________  RADIOLOGY  ED MD interpretation: CT demonstrates worsening osteoarthritis but no acute fractures  Official radiology report(s): Ct Hip Right Wo Contrast  Result Date: 08/23/2018 CLINICAL DATA:  Persistent right hip/buttock pain. Pain radiates down leg. EXAM: CT OF THE RIGHT HIP WITHOUT CONTRAST TECHNIQUE: Multidetector CT imaging of the right hip was performed according to the standard protocol. Multiplanar CT image reconstructions were also generated. COMPARISON:  Radiographs 08/15/2018. Right hip CT and MRI 01/23/2015 FINDINGS: Bones/Joint/Cartilage No fracture or bony destruction. Osteoarthritis with slight progression from 2016.  No focal lesion by CT. No evidence of avascular necrosis. No significant hip joint effusion. Ligaments Suboptimally assessed by CT. Muscles and Tendons No acute findings. Soft tissues Right common femoral atherosclerosis. Fat in the right inguinal canal. IMPRESSION: Osteoarthritis with slight progression from 2016. No acute osseous abnormality. Electronically Signed   By: Keith Rake M.D.   On: 08/23/2018 05:29    ____________________________________________   PROCEDURES  Procedure(s) performed: None  Procedures  Critical Care performed: No  ____________________________________________   INITIAL IMPRESSION / ASSESSMENT AND PLAN / ED COURSE  As part of my medical decision making, I reviewed the following data within the [place] History obtained from family, Nursing notes reviewed and incorporated, Old chart reviewed, Radiograph reviewed and Notes from prior ED visits   84 year old female who presents with persistent right hip pain for the past 2 weeks.  Fall approximately 6 weeks ago.  Frontal diagnosis includes but is not limited to sciatica, pelvic fracture, occult hip fracture, muscle contusion, musculoskeletal strain, etc.  Will obtain basic lab work, urinalysis.  Administer 2 mg IV morphine for pain paired with 4 mg IV Zofran for nausea.  Will obtain CT right hip to evaluate for occult fractures.  Clinical Course as of Aug 23 717  Wed Aug 23, 2018  0600 Updated patient and family members of CT results.  She continues to have pain.  Placed on 2 L nasal cannula oxygen for sats 90% after IV morphine.  Will obtain MRI.   [JS]  3664 MRI pending. Care transferred to Dr. Quentin Cornwall pending MRI results and disposition.   [JS]    Clinical Course User Index [JS] Paulette Blanch, MD     ____________________________________________   FINAL CLINICAL IMPRESSION(S) / ED DIAGNOSES  Final diagnoses:  Right hip pain     ED Discharge Orders    None  Note:  This document was prepared using Dragon voice recognition software and may include unintentional dictation errors.    Paulette Blanch, MD 08/23/18 [phone]

## 2018-08-23 NOTE — Clinical Social Work Note (Signed)
CSW received phone call from PT, they are recommending home health, CSW contacted case manager, patient's son is in agreement with home health, [place] signing off.  Please reconsult if social work needs arise.  Evette Cristal, MSW, [facility] ED Covering CSW [phone] 08/23/2018 10:31 AM

## 2018-08-23 NOTE — ED Notes (Signed)
Social worker at bedside.

## 2018-08-23 NOTE — ED Notes (Signed)
As per PT will recommend home health assist. Patient did well with walker. Po meds given for [ain. Patch applied to right lower back.

## 2018-08-23 NOTE — ED Provider Notes (Signed)
Patient has been evaluated by physical therapy and recommending home health.  She is been giving her home medications.  Will be discharged home with additional pain medication and outpatient follow-up.   Merlyn Lot, MD 08/23/18 1110

## 2018-08-23 NOTE — ED Notes (Signed)
Patient comfortable at present time. Call light w/i reach. Son at bedside. Awaiting PT eval and further plan of care.

## 2018-08-23 NOTE — ED Notes (Signed)
PT at bedside to assess. Patient.

## 2018-08-23 NOTE — Evaluation (Signed)
Physical Therapy Evaluation Patient Details Name: [name] MRN: [id number] DOB: 25-Nov-1928 Today's Date: 08/23/2018   History of Present Illness  Patient is an 84 year old female in the ED from home following c/o R hip pain with radiating  sensation down posterior thigh.  PMH includes htn, COPD, chronic back pain, cervicalgia, atrial fibrillation and angina pectoris.  Clinical Impression  Patient is an 84 year old female who lives in a one story home alone.  She is generally active without use of AD at baseline.  Pt presents with R pain which she describes as being located in lateral and posterior aspect of hip and radiating down R posterior thigh.  Pt able to perform bed mobility and mod I and position to remove socks, though she did need assistance in donning hospital socks.  Pt able to rise from bedside without assistance from PT, needing RW to stabilize herself.  Pt ambulated a total of 25 ft with RW and close CGA, presenting with an antalgic gait and needing RW to steady herself.  Pt has a RW at home which she is open to using.  Special tests included  FABER, SLR and Figure 4, all + for pain.  Scour test: -.  PT provided education concerning management of sciatic pain and what to expect during [facility] PT and pt and son were receptive.  Pt will benefit from continued PT with focus on strength, safe functional mobility, pain management and fall prevention.    Follow Up Recommendations Home health PT;Supervision for mobility/OOB    Equipment Recommendations  3in1 (PT)    Recommendations for Other Services       Precautions / Restrictions Precautions Precautions: Fall Restrictions Weight Bearing Restrictions: No      Mobility  Bed Mobility Overal bed mobility: Modified Independent             General bed mobility comments: Slow to get to bedside and to return to bed due to pain.  Transfers Overall transfer level: Needs assistance Equipment used: Rolling walker (2  wheeled) Transfers: Sit to/from Stand Sit to Stand: Min guard         General transfer comment: Pt able to rise from bed without physical assistance.  She is familiar with proper use of RW and more comfortable with mobility using RW at this time.  Ambulation/Gait Ambulation/Gait assistance: Min guard Gait Distance (Feet): 25 Feet Assistive device: Rolling walker (2 wheeled)     Gait velocity interpretation: <1.31 ft/sec, indicative of household ambulator General Gait Details: Slow antalgic gait with low to moderate foot clearance, good use of RW and more steady with at this time.  Pt stated that she could have walked farther if needed but was ready to return to bed due to discomfort in hip and increased pain in posterior thigh.  Stairs            Wheelchair Mobility    Modified Rankin (Stroke Patients Only)       Balance Overall balance assessment: Modified Independent                                           Pertinent Vitals/Pain Pain Assessment: Faces Faces Pain Scale: Hurts little more Pain Location: posterior thigh during ambulation; pain in R hip onset by FABER, SLR 30 degrees, figure 4 of R side. Pain Descriptors / Indicators: Tingling    Home  Living Family/patient expects to be discharged to:: Private residence Living Arrangements: Alone Available Help at Discharge: Family;Available PRN/intermittently(Patient's daughter and son are available for the majority of the day.  Son lives 0.5 mi from pt's home.) Type of Home: House Home Access: Stairs to enter Entrance Stairs-Rails: Right Entrance Stairs-Number of Steps: 2 Home Layout: One level        Prior Function Level of Independence: Independent         Comments: Patient has a SPC and RW but has not been using prior to onset of hip pain.     Hand Dominance        Extremity/Trunk Assessment   Upper Extremity Assessment Upper Extremity Assessment: Overall WFL for tasks  assessed    Lower Extremity Assessment Lower Extremity Assessment: Overall WFL for tasks assessed;RLE deficits/detail RLE Deficits / Details: R LE knee flexion, extension and hip flexion limited by pain.  Pt reported new onset of pain in L heel which did not present with increased softness compared to R heel.  Pt does have a large callous on plantar aspect of L heel. RLE: Unable to fully assess due to pain    Cervical / Trunk Assessment Cervical / Trunk Assessment: Normal  Communication   Communication: HOH  Cognition Arousal/Alertness: Awake/alert Behavior During Therapy: WFL for tasks assessed/performed Overall Cognitive Status: Within Functional Limits for tasks assessed                                 General Comments: Follows directions consistently.  Possible short term memory loss demonstrated by disagreement with son over medical hx.      General Comments General comments (skin integrity, edema, etc.): R LE: FABER: +, SLR: + for pain at 30 degrees, Figure 4: +, L LE: FABER: -, SLR: -, Figure 4: not performed    Exercises Other Exercises Other Exercises: R LE: supine nerve gliding within range of tolerance, education provided to pt and son.  5 min Other Exercises: Education provided concerning anticipated timeline and management HEP with HH PT. x4 min   Assessment/Plan    PT Assessment Patient needs continued PT services  PT Problem List Decreased strength;Decreased activity tolerance;Decreased mobility;Pain       PT Treatment Interventions Gait training;Stair training;Functional mobility training;Therapeutic activities;Therapeutic exercise;Patient/family education    PT Goals (Current goals can be found in the Care Plan section)  Acute Rehab PT Goals Patient Stated Goal: To return to active lifestyle including driving herself to church and the hair dresset. PT Goal Formulation: With patient/family Time For Goal Achievement: 09/06/18 Potential to  Achieve Goals: Good    Frequency Min 2X/week   Barriers to discharge        Co-evaluation               AM-PAC PT "6 Clicks" Mobility  Outcome Measure Help needed turning from your back to your side while in a flat bed without using bedrails?: None Help needed moving from lying on your back to sitting on the side of a flat bed without using bedrails?: A Little Help needed moving to and from a bed to a chair (including a wheelchair)?: A Little Help needed standing up from a chair using your arms (e.g., wheelchair or bedside chair)?: A Little Help needed to walk in hospital room?: A Little Help needed climbing 3-5 steps with a railing? : A Little 6 Click Score: 19    End of  Session Equipment Utilized During Treatment: Gait belt;Oxygen Activity Tolerance: Patient limited by pain Patient left: in bed;with nursing/sitter in room;with family/visitor present;with call bell/phone within reach Nurse Communication: Mobility status PT Visit Diagnosis: Unsteadiness on feet (R26.81);Muscle weakness (generalized) (M62.81);Pain Pain - Right/Left: Right Pain - part of body: Hip    Time: 5188-4166 PT Time Calculation (min) (ACUTE ONLY): 33 min   Charges:   PT Evaluation $PT Eval Low Complexity: 1 Low PT Treatments $Therapeutic Activity: 8-22 mins        Roxanne Gates, PT, DPT   Roxanne Gates 08/23/2018, 10:23 AM

## 2018-08-23 NOTE — ED Notes (Signed)
Patient returned from CT, Patient requesting a perscripton for pain meds upon discharge.

## 2018-08-28 ENCOUNTER — Telehealth: Payer: Self-pay | Admitting: Family Medicine

## 2018-08-28 NOTE — Telephone Encounter (Signed)
If you tell me what she needs I will get it ordered Thanks

## 2018-08-28 NOTE — Telephone Encounter (Signed)
Elder Cyphers w/ [facility] states she evaluatied for home health PT and patient and her fmaily feel the patient would be a better canidate for Outpt PT.  Requesting referral for [facility] phone [phone] or fax [phone]

## 2018-08-28 NOTE — Telephone Encounter (Signed)
ok 

## 2018-08-29 ENCOUNTER — Telehealth: Payer: Self-pay | Admitting: Family Medicine

## 2018-08-29 ENCOUNTER — Other Ambulatory Visit: Payer: Self-pay

## 2018-08-29 DIAGNOSIS — M25551 Pain in right hip: Secondary | ICD-10-CM

## 2018-08-29 DIAGNOSIS — M79604 Pain in right leg: Secondary | ICD-10-CM

## 2018-08-29 DIAGNOSIS — M79605 Pain in left leg: Principal | ICD-10-CM

## 2018-08-29 NOTE — Telephone Encounter (Signed)
Pt is having severe leg pain.  Asking if Dr. Darnell Level. Could advise and prescribe something for relief.  Please advise.  Thanks, [employer]

## 2018-08-29 NOTE — Telephone Encounter (Signed)
Please review

## 2018-08-30 ENCOUNTER — Telehealth: Payer: Self-pay | Admitting: Family Medicine

## 2018-08-30 MED ORDER — HYDROCODONE-ACETAMINOPHEN 5-325 MG PO TABS: 1.0000 | ORAL_TABLET | Freq: Four times a day (QID) | ORAL | 0 refills | Status: DC | PRN

## 2018-08-30 NOTE — Telephone Encounter (Signed)
Done and patient advised.  

## 2018-08-30 NOTE — Telephone Encounter (Signed)
Patient is hurting badly from her right knee up to her hip and she is wanting to be referred to an orthopedic for this.

## 2018-08-30 NOTE — Telephone Encounter (Signed)
I am putting in the referral to ortho as was in the other message this morning on her.  However, she is asking about something for the pain.

## 2018-08-30 NOTE — Telephone Encounter (Signed)
Done  ED 

## 2018-08-30 NOTE — Telephone Encounter (Signed)
Reluctantly Norco 5/325 q 6 hrs prn,#35,no rf.

## 2018-08-30 NOTE — Telephone Encounter (Signed)
She needs referral to orthopedics.  I am not sure what the cause of her pain is at this time.

## 2018-08-30 NOTE — Telephone Encounter (Signed)
Also I did put the refer to PT in yesterday.

## 2018-08-30 NOTE — Telephone Encounter (Signed)
Patient is asking "please" call her in something for pain in her right knee up to her hip.  She ask for pain med yesterday but it wasn't called in.  [facility]

## 2018-08-31 ENCOUNTER — Telehealth: Payer: Self-pay | Admitting: Family Medicine

## 2018-08-31 NOTE — Telephone Encounter (Signed)
Pt's daughter Conrad [place] - [phone] Wanted to let Dr. Rosanna Randy know pt cannot get a PT evaluation done on pt until January.  Daughter doesn't think pt can wait that long.  Please advise.  Thanks, [employer]

## 2018-09-01 NOTE — Telephone Encounter (Signed)
Please review. Thanks!  

## 2018-09-04 DIAGNOSIS — M5136 Other intervertebral disc degeneration, lumbar region: Secondary | ICD-10-CM | POA: Diagnosis not present

## 2018-09-04 DIAGNOSIS — M4310 Spondylolisthesis, site unspecified: Secondary | ICD-10-CM | POA: Insufficient documentation

## 2018-09-04 DIAGNOSIS — M4696 Unspecified inflammatory spondylopathy, lumbar region: Secondary | ICD-10-CM | POA: Diagnosis not present

## 2018-09-04 DIAGNOSIS — M51369 Other intervertebral disc degeneration, lumbar region without mention of lumbar back pain or lower extremity pain: Secondary | ICD-10-CM | POA: Insufficient documentation

## 2018-09-04 DIAGNOSIS — M47816 Spondylosis without myelopathy or radiculopathy, lumbar region: Secondary | ICD-10-CM | POA: Insufficient documentation

## 2018-09-04 NOTE — Telephone Encounter (Signed)
Hello Judson Roch! If we want to try another physical therapist, do I need to place a new order? Patient wants to see if she can be seen sooner. Thanks!

## 2018-09-04 NOTE — Telephone Encounter (Signed)
See if we can get a referral to a different physical therapy.  Can try Ortho referral but I am not sure that will be helpful for her

## 2018-09-06 NOTE — Telephone Encounter (Signed)
Pt has an appointment at Dr Ammie Ferrier office for physical therapy 09/07/18 at 11:00

## 2018-09-06 NOTE — Telephone Encounter (Signed)
FYI

## 2018-09-07 DIAGNOSIS — M6281 Muscle weakness (generalized): Secondary | ICD-10-CM | POA: Diagnosis not present

## 2018-09-07 DIAGNOSIS — M5136 Other intervertebral disc degeneration, lumbar region: Secondary | ICD-10-CM | POA: Diagnosis not present

## 2018-09-11 ENCOUNTER — Other Ambulatory Visit: Payer: Self-pay | Admitting: Family Medicine

## 2018-09-15 DIAGNOSIS — M6281 Muscle weakness (generalized): Secondary | ICD-10-CM | POA: Diagnosis not present

## 2018-09-15 DIAGNOSIS — M5136 Other intervertebral disc degeneration, lumbar region: Secondary | ICD-10-CM | POA: Diagnosis not present

## 2018-09-21 DIAGNOSIS — M6281 Muscle weakness (generalized): Secondary | ICD-10-CM | POA: Diagnosis not present

## 2018-09-21 DIAGNOSIS — M5136 Other intervertebral disc degeneration, lumbar region: Secondary | ICD-10-CM | POA: Diagnosis not present

## 2018-09-25 DIAGNOSIS — M6281 Muscle weakness (generalized): Secondary | ICD-10-CM | POA: Diagnosis not present

## 2018-09-25 DIAGNOSIS — M545 Low back pain: Secondary | ICD-10-CM | POA: Diagnosis not present

## 2018-09-25 DIAGNOSIS — M5136 Other intervertebral disc degeneration, lumbar region: Secondary | ICD-10-CM | POA: Diagnosis not present

## 2018-10-02 ENCOUNTER — Telehealth: Payer: Self-pay

## 2018-10-02 DIAGNOSIS — M79672 Pain in left foot: Principal | ICD-10-CM

## 2018-10-02 DIAGNOSIS — M79671 Pain in right foot: Secondary | ICD-10-CM

## 2018-10-02 DIAGNOSIS — M545 Low back pain: Secondary | ICD-10-CM | POA: Diagnosis not present

## 2018-10-02 NOTE — Telephone Encounter (Signed)
Please place referral for podiatry.See below,Thanks

## 2018-10-02 NOTE — Telephone Encounter (Signed)
Patient requesting a referral to podiatry for heel problem. sd

## 2018-10-02 NOTE — Telephone Encounter (Signed)
Please advise 

## 2018-10-02 NOTE — Telephone Encounter (Signed)
ok 

## 2018-10-02 NOTE — Telephone Encounter (Signed)
Order placed

## 2018-10-02 NOTE — Telephone Encounter (Signed)
Patient reports someone called her to give her an appt for podiatry but does not know where the appt is scheduled at. Please advise. sd

## 2018-10-04 DIAGNOSIS — S330XXA Traumatic rupture of lumbar intervertebral disc, initial encounter: Secondary | ICD-10-CM | POA: Diagnosis not present

## 2018-10-05 ENCOUNTER — Encounter: Payer: Self-pay | Admitting: Podiatry

## 2018-10-05 ENCOUNTER — Ambulatory Visit: Payer: Medicare Other | Admitting: Podiatry

## 2018-10-05 DIAGNOSIS — L97421 Non-pressure chronic ulcer of left heel and midfoot limited to breakdown of skin: Secondary | ICD-10-CM | POA: Diagnosis not present

## 2018-10-05 DIAGNOSIS — E119 Type 2 diabetes mellitus without complications: Secondary | ICD-10-CM

## 2018-10-05 NOTE — Progress Notes (Signed)
This patient presents the office with chief complaint of pain on the back of her left heel.  She says that the heel has been painful for approximately 2 to 3 weeks.  She has no history of trauma or injury to the foot.  She says she experiences pain as she places her foot in placed her foot down.  She presents the office today with her daughter for an evaluation and treatment.  The mother states that there is no drainage from the painful left heel and the mother agrees.  She presents the office today for an evaluation and treatment of this painful skin lesion.  Vascular  Dorsalis pedis and posterior tibial pulses are minimally  palpable  B/L.  Capillary return  WNL.  Temperature gradient is  WNL.  Skin turgor  WNL  Sensorium  Deferred.  Nail Exam  Patient has normal nails with no evidence of bacterial or fungal infection.  Orthopedic  Exam  Muscle tone and muscle strength  WNL.  No limitations of motion feet  B/L.  No crepitus or joint effusion noted.  Foot type is unremarkable and digits show no abnormalities.  Bony prominences are unremarkable.  Skin    Normal skin texture and turgor.  There is a circumscribed skin lesion noted on the posterior aspect of the left heel.  There is no evidence of any drainage today.  Posterior heel ulcer left foot.  IE>  Heel pillow: was dispensed.  Discuss this condition with this patient.  Since there was no evidence of any drainage or infection I did not apply a bandage to this open skin lesion.  If this worsens she is to return to the office at which time we will continue evaluation and further treatment.   Gardiner Barefoot DPM

## 2018-10-10 MED ORDER — SITAGLIPTIN PHOSPHATE 50 MG PO TABS: 50.0000 mg | ORAL_TABLET | Freq: Every day | ORAL | 12 refills | Status: DC

## 2018-10-10 NOTE — Progress Notes (Signed)
Patient was called to inform her that her insurance would no longer pay for her Onglyza and that we needed to change it to [place].  When told she stated she was not taking Onglyza.  She then informed me that she brings her pill boxes to [place] and they fill the boxes, therefore, she really didn't know what was in them.   [facility] and we [facility] all of her medication.  She has been on Onglyza and that has now been switched to [place].   While reconciling her medications we noted several discrepancies.   Patient was given by Dr. Earnestine Leys on 08/30/18 the following meds that were not on our list Tramadol 50 mg, Meloxicam 15 mg, and Methocarbamol.  The pharmacy then reported that the patient has not been getting Lantus or Humalog since 01/2018   We also had listed Hydrocodone from Dr Caryn Section that was for 1 time and Prednisone from [facility].  Both of which have been taken off of the list.

## 2018-10-19 ENCOUNTER — Other Ambulatory Visit: Payer: Self-pay | Admitting: Family Medicine

## 2018-10-19 MED ORDER — FLUOXETINE HCL 40 MG PO CAPS: 40.0000 mg | ORAL_CAPSULE | Freq: Every day | ORAL | 12 refills | Status: DC

## 2018-10-19 NOTE — Telephone Encounter (Signed)
[facility] faxed refill request for the following medications:  FLUoxetine (PROZAC) 40 MG capsule  90 day supply  Last Rx: 07/23/18 30 day supply 12 refills LOV: 08/17/18 with Mikki Santee Please advise. Thanks TNP

## 2018-10-26 DIAGNOSIS — M48061 Spinal stenosis, lumbar region without neurogenic claudication: Secondary | ICD-10-CM | POA: Diagnosis not present

## 2018-11-02 ENCOUNTER — Ambulatory Visit: Payer: Medicare Other | Admitting: Family Medicine

## 2018-11-06 ENCOUNTER — Ambulatory Visit: Payer: Medicare Other

## 2018-11-07 ENCOUNTER — Other Ambulatory Visit: Payer: Self-pay

## 2018-11-07 ENCOUNTER — Encounter: Payer: Self-pay | Admitting: Family Medicine

## 2018-11-07 ENCOUNTER — Ambulatory Visit (INDEPENDENT_AMBULATORY_CARE_PROVIDER_SITE_OTHER): Payer: Medicare Other | Admitting: Family Medicine

## 2018-11-07 VITALS — BP 156/70 | HR 83 | Temp 98.0°F | Ht 62.0 in | Wt 147.4 lb

## 2018-11-07 DIAGNOSIS — J449 Chronic obstructive pulmonary disease, unspecified: Secondary | ICD-10-CM | POA: Diagnosis not present

## 2018-11-07 DIAGNOSIS — E1121 Type 2 diabetes mellitus with diabetic nephropathy: Secondary | ICD-10-CM | POA: Diagnosis not present

## 2018-11-07 DIAGNOSIS — I10 Essential (primary) hypertension: Secondary | ICD-10-CM | POA: Diagnosis not present

## 2018-11-07 DIAGNOSIS — C439 Malignant melanoma of skin, unspecified: Secondary | ICD-10-CM

## 2018-11-07 DIAGNOSIS — M4807 Spinal stenosis, lumbosacral region: Secondary | ICD-10-CM

## 2018-11-07 DIAGNOSIS — R269 Unspecified abnormalities of gait and mobility: Secondary | ICD-10-CM

## 2018-11-07 DIAGNOSIS — Z794 Long term (current) use of insulin: Secondary | ICD-10-CM

## 2018-11-07 NOTE — Progress Notes (Signed)
Patient: [name] Female    DOB: 1928-10-01   85 y.o.   MRN: [id number] Visit Date: 11/07/2018  Today's Provider: Wilhemena Durie, MD   Chief Complaint  Patient presents with  . procedure clearance    spinal injections   Subjective:     HPI  Pt reports she is having a S1, L4, L5 epidural injection and needs a clearance to have done with [facility] No CP,SOB,syncope,neurologic symptoms.   Allergies  Allergen Reactions  . Neomycin-Bacitracin Zn-Polymyx Swelling and Rash  . Latex Rash  . Lidocaine Rash  . Aricept [Donepezil Hcl] Diarrhea  . Benzalkonium Chloride Itching and Swelling  . Ibuprofen     Other reaction(s): Dizziness Heart fluttering. Tachycardia. Tachycardia.  . Valdecoxib Nausea And Vomiting  . Albuterol Rash  . Tape Rash    blisters  . Triamcinolone Rash     Current Outpatient Medications:  .  Ascorbic Acid (VITAMIN C) 1000 MG tablet, Take 1,000 mg by mouth daily. , Disp: , Rfl:  .  aspirin 325 MG tablet, Take 325 mg by mouth daily. Reported on 01/05/2016, Disp: , Rfl:  .  ASSURE COMFORT LANCETS 30G MISC, , Disp: , Rfl:  .  Blood Glucose Calibration (ACCU-CHEK SMARTVIEW CONTROL) LIQD, , Disp: , Rfl:  .  ezetimibe (ZETIA) 10 MG tablet, TAKE 1 TABLET BY MOUTH DAILY (Patient taking differently: Take 10 mg by mouth daily. ), Disp: 30 tablet, Rfl: 12 .  FLUoxetine (PROZAC) 40 MG capsule, Take 1 capsule (40 mg total) by mouth daily., Disp: 30 capsule, Rfl: 12 .  fluticasone-salmeterol (ADVAIR HFA) 230-21 MCG/ACT inhaler, Inhale 2 puffs into the lungs 2 (two) times daily., Disp: 1 Inhaler, Rfl: 12 .  glucose blood test strip, ACCU-CHEK AVIVA PLUS TEST STRP Use to check sugars 3 times daily., Disp: , Rfl:  .  insulin glargine (LANTUS) 100 UNIT/ML injection, Inject 0.05 mLs (5 Units total) into the skin at bedtime. (Patient taking differently: Inject 7 Units into the skin at bedtime. ), Disp: 10 mL, Rfl: 11 .  insulin lispro  (HUMALOG KWIKPEN) 100 UNIT/ML KiwkPen, Inject 6 Units into the skin 3 (three) times daily. 12 units in the am, 10 units at lunch and 12 units in the pm, Disp: , Rfl:  .  lansoprazole (PREVACID) 30 MG capsule, TAKE 1 CAPSULE BY MOUTH EVERY DAY AT NOON, Disp: 90 capsule, Rfl: 3 .  losartan (COZAAR) 100 MG tablet, TAKE ONE TABLET BY MOUTH EVERY DAY, Disp: 90 tablet, Rfl: 3 .  Magnesium 400 MG CAPS, Take by mouth daily., Disp: , Rfl:  .  memantine (NAMENDA) 5 MG tablet, TAKE ONE TABLET TWICE DAILY, Disp: 60 tablet, Rfl: 12 .  Multiple Vitamins-Minerals (ICAPS AREDS 2 PO), Take by mouth., Disp: , Rfl:  .  potassium chloride (K-DUR) 10 MEQ tablet, TAKE 1 TABLET BY MOUTH TWICE DAILY (Patient taking differently: Take 10 mEq by mouth 2 (two) times daily. ), Disp: 180 tablet, Rfl: 3 .  PROAIR HFA 108 (90 Base) MCG/ACT inhaler, INHALE 2 PUFFS INTO THE LUNGS EVERY 4 HOURS AS NEEDED FOR WHEEZING ORSHORTNESS OF BREATH, Disp: 8.5 g, Rfl: 2 .  sitaGLIPtin (JANUVIA) 50 MG tablet, Take 1 tablet (50 mg total) by mouth daily., Disp: 30 tablet, Rfl: 12 .  SURE COMFORT PEN NEEDLES 31G X 5 MM MISC, , Disp: , Rfl:  .  traZODone (DESYREL) 50 MG tablet, TAKE 1 AND 1/2 TABLET AT BEDTIME AS NEEDED  FOR SLEEP, Disp: 45 tablet, Rfl: 11 .  vitamin E 1000 UNIT capsule, Take 1,000 Units by mouth daily., Disp: , Rfl:   Review of Systems  Constitutional: Negative.   HENT: Negative.   Eyes: Negative.   Respiratory: Negative.   Cardiovascular: Negative.   Gastrointestinal: Negative.   Endocrine: Negative.   Genitourinary: Negative.   Musculoskeletal: Positive for back pain.  Skin: Negative.   Allergic/Immunologic: Negative.   Neurological: Negative.   Hematological: Negative.   Psychiatric/Behavioral: Negative.     Social History   Tobacco Use  . Smoking status: Never Smoker  . Smokeless tobacco: Never Used  Substance Use Topics  . Alcohol use: No      Objective:   BP (!) 156/70 (BP Location: Left Arm, Patient  Position: Sitting, Cuff Size: Normal)   Pulse 83   Temp 98 F (36.7 C) (Oral)   Ht 5\' 2"  (1.575 m)   Wt 147 lb 6.4 oz (66.9 kg)   SpO2 96%   BMI 26.96 kg/m  Vitals:   11/07/18 1005  BP: (!) 156/70  Pulse: 83  Temp: 98 F (36.7 C)  TempSrc: Oral  SpO2: 96%  Weight: 147 lb 6.4 oz (66.9 kg)  Height: 5\' 2"  (1.575 m)     Physical Exam Constitutional:      Appearance: She is well-developed.  HENT:     Head: Normocephalic and atraumatic.  Eyes:     General: No scleral icterus.    Conjunctiva/sclera: Conjunctivae normal.  Neck:     Thyroid: No thyromegaly.     Vascular: No carotid bruit.  Cardiovascular:     Rate and Rhythm: Normal rate and regular rhythm.  Pulmonary:     Effort: Pulmonary effort is normal.  Skin:    General: Skin is warm and dry.     Findings: Laceration present.  Neurological:     Mental Status: She is alert and oriented to person, place, and time.  Psychiatric:        Behavior: Behavior normal.        Thought Content: Thought content normal.        Judgment: Judgment normal.         Assessment & Plan       1. Spinal stenosis of lumbosacral region Pt cleared for soinal injections.  2. Benign essential HTN Fair control.  3. Chronic obstructive pulmonary disease, unspecified COPD type ([place])   4. Type 2 diabetes mellitus with diabetic nephropathy, with long-term current use of insulin [facility]) Per endocrinology.  5. 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[place])  6. Abnormal gait Discussed fall risk.   I have done the exam and reviewed the above chart and it is accurate to the best of my knowledge. Development worker, community has been used in this note in any air is in the dictation or transcription are unintentional.  Wilhemena Durie, MD  [facility]

## 2018-11-08 DIAGNOSIS — N183 Chronic kidney disease, stage 3 (moderate): Secondary | ICD-10-CM | POA: Diagnosis not present

## 2018-11-08 DIAGNOSIS — E1122 Type 2 diabetes mellitus with diabetic chronic kidney disease: Secondary | ICD-10-CM | POA: Diagnosis not present

## 2018-11-08 DIAGNOSIS — Z794 Long term (current) use of insulin: Secondary | ICD-10-CM | POA: Diagnosis not present

## 2018-11-10 ENCOUNTER — Encounter: Payer: Self-pay | Admitting: Family Medicine

## 2018-11-10 ENCOUNTER — Ambulatory Visit (INDEPENDENT_AMBULATORY_CARE_PROVIDER_SITE_OTHER): Payer: Medicare Other | Admitting: Family Medicine

## 2018-11-10 ENCOUNTER — Other Ambulatory Visit: Payer: Self-pay

## 2018-11-10 VITALS — BP 142/64 | HR 88 | Temp 98.3°F | Ht 62.0 in | Wt 147.2 lb

## 2018-11-10 DIAGNOSIS — N3090 Cystitis, unspecified without hematuria: Secondary | ICD-10-CM

## 2018-11-10 LAB — POCT URINALYSIS DIPSTICK
Bilirubin, UA: NEGATIVE
Glucose, UA: NEGATIVE
Ketones, UA: NEGATIVE
Nitrite, UA: NEGATIVE
PH UA: 6 (ref 5.0–8.0)
Protein, UA: POSITIVE — AB
RBC UA: NEGATIVE
Spec Grav, UA: 1.025 (ref 1.010–1.025)
Urobilinogen, UA: 0.2 E.U./dL

## 2018-11-10 MED ORDER — FLUCONAZOLE 150 MG PO TABS: 150.0000 mg | ORAL_TABLET | Freq: Once | ORAL | 0 refills | Status: AC

## 2018-11-10 MED ORDER — CEPHALEXIN 500 MG PO CAPS: 500.0000 mg | ORAL_CAPSULE | Freq: Two times a day (BID) | ORAL | 0 refills | Status: DC

## 2018-11-10 NOTE — Progress Notes (Signed)
  Subjective:     Patient ID: [name], female   DOB: 1929/03/28, 85 y.o.   MRN: [id number] Chief Complaint  Patient presents with  . Hematuria    and having pain in left side since today   HPI No recent UTI's. Accompanied by her daughter today.  Review of Systems     Objective:   Physical Exam Constitutional:      General: She is not in acute distress.    Appearance: She is not ill-appearing.  Genitourinary:    Comments: No cva tenderness Neurological:     Mental Status: She is alert.        Assessment:    1. Cystitis - POCT Urinalysis Dipstick - Urine Culture - cephALEXin (KEFLEX) 500 MG capsule; Take 1 capsule (500 mg total) by mouth 2 (two) times daily.  Dispense: 14 capsule; Refill: 0 - fluconazole (DIFLUCAN) 150 MG tablet; Take 1 tablet (150 mg total) by mouth once for 1 dose.  Dispense: 1 tablet; Refill: 0    Plan:    Further f/u pending urine culture result. Daughter requests they call her as well.

## 2018-11-10 NOTE — Patient Instructions (Signed)
We will call you with the urine culture results early next week.

## 2018-11-12 LAB — URINE CULTURE

## 2018-11-13 ENCOUNTER — Other Ambulatory Visit: Payer: Self-pay | Admitting: Physician Assistant

## 2018-11-13 ENCOUNTER — Other Ambulatory Visit: Payer: Self-pay | Admitting: Family Medicine

## 2018-11-13 ENCOUNTER — Telehealth: Payer: Self-pay

## 2018-11-13 DIAGNOSIS — J449 Chronic obstructive pulmonary disease, unspecified: Secondary | ICD-10-CM

## 2018-11-13 DIAGNOSIS — I10 Essential (primary) hypertension: Secondary | ICD-10-CM

## 2018-11-13 NOTE — Telephone Encounter (Signed)
Called patient's home number. Patient's daughter [name] answered the phone. I advised [name] of the results. [name] states she thinks her mom is feeling better with the antibiotic. [name] states she would call back later this afternoon to update [place] on how her mom feels. He mom wasn't with her at the time of my call.

## 2018-11-13 NOTE — Telephone Encounter (Signed)
Patients daughter [name] called back reporting that her mom states that she feels better.

## 2018-11-13 NOTE — Telephone Encounter (Signed)
-----   Message from Carmon Ginsberg, [place] sent at 11/13/2018  7:15 AM EST ----- No specific organism detected. Is she feeling better on the antibiotic?

## 2018-11-15 DIAGNOSIS — Z794 Long term (current) use of insulin: Secondary | ICD-10-CM | POA: Diagnosis not present

## 2018-11-15 DIAGNOSIS — E1142 Type 2 diabetes mellitus with diabetic polyneuropathy: Secondary | ICD-10-CM | POA: Diagnosis not present

## 2018-11-15 DIAGNOSIS — N183 Chronic kidney disease, stage 3 (moderate): Secondary | ICD-10-CM | POA: Diagnosis not present

## 2018-11-15 DIAGNOSIS — E1159 Type 2 diabetes mellitus with other circulatory complications: Secondary | ICD-10-CM | POA: Diagnosis not present

## 2018-11-15 DIAGNOSIS — E1122 Type 2 diabetes mellitus with diabetic chronic kidney disease: Secondary | ICD-10-CM | POA: Diagnosis not present

## 2018-11-20 DIAGNOSIS — M545 Low back pain: Secondary | ICD-10-CM | POA: Diagnosis not present

## 2018-12-11 ENCOUNTER — Ambulatory Visit: Payer: Self-pay | Admitting: Family Medicine

## 2018-12-12 ENCOUNTER — Ambulatory Visit: Payer: Self-pay | Admitting: Family Medicine

## 2018-12-12 ENCOUNTER — Other Ambulatory Visit: Payer: Self-pay | Admitting: Family Medicine

## 2018-12-12 ENCOUNTER — Other Ambulatory Visit: Payer: Self-pay | Admitting: Physician Assistant

## 2018-12-12 DIAGNOSIS — J449 Chronic obstructive pulmonary disease, unspecified: Secondary | ICD-10-CM

## 2018-12-14 DIAGNOSIS — E1122 Type 2 diabetes mellitus with diabetic chronic kidney disease: Secondary | ICD-10-CM | POA: Diagnosis not present

## 2018-12-14 DIAGNOSIS — E1142 Type 2 diabetes mellitus with diabetic polyneuropathy: Secondary | ICD-10-CM | POA: Diagnosis not present

## 2018-12-14 DIAGNOSIS — N183 Chronic kidney disease, stage 3 (moderate): Secondary | ICD-10-CM | POA: Diagnosis not present

## 2018-12-14 DIAGNOSIS — M791 Myalgia, unspecified site: Secondary | ICD-10-CM | POA: Diagnosis not present

## 2018-12-14 DIAGNOSIS — E1159 Type 2 diabetes mellitus with other circulatory complications: Secondary | ICD-10-CM | POA: Diagnosis not present

## 2018-12-18 ENCOUNTER — Ambulatory Visit: Payer: Medicare Other

## 2018-12-28 ENCOUNTER — Ambulatory Visit: Payer: Medicare Other

## 2019-01-03 ENCOUNTER — Other Ambulatory Visit: Payer: Self-pay | Admitting: Family Medicine

## 2019-01-03 DIAGNOSIS — E876 Hypokalemia: Secondary | ICD-10-CM

## 2019-01-29 ENCOUNTER — Encounter: Payer: Self-pay | Admitting: Family Medicine

## 2019-01-29 ENCOUNTER — Other Ambulatory Visit: Payer: Self-pay

## 2019-01-29 ENCOUNTER — Ambulatory Visit (INDEPENDENT_AMBULATORY_CARE_PROVIDER_SITE_OTHER): Payer: Medicare Other | Admitting: Family Medicine

## 2019-01-29 VITALS — BP 138/78 | HR 88 | Temp 98.6°F | Resp 16 | Wt 146.0 lb

## 2019-01-29 DIAGNOSIS — C439 Malignant melanoma of skin, unspecified: Secondary | ICD-10-CM | POA: Diagnosis not present

## 2019-01-29 DIAGNOSIS — K648 Other hemorrhoids: Secondary | ICD-10-CM

## 2019-01-29 DIAGNOSIS — E1121 Type 2 diabetes mellitus with diabetic nephropathy: Secondary | ICD-10-CM

## 2019-01-29 DIAGNOSIS — K625 Hemorrhage of anus and rectum: Secondary | ICD-10-CM

## 2019-01-29 DIAGNOSIS — Z794 Long term (current) use of insulin: Secondary | ICD-10-CM

## 2019-01-29 DIAGNOSIS — I480 Paroxysmal atrial fibrillation: Secondary | ICD-10-CM | POA: Diagnosis not present

## 2019-01-29 LAB — IFOBT (OCCULT BLOOD): IFOBT: NEGATIVE

## 2019-01-29 MED ORDER — HYDROCORTISONE ACETATE 25 MG RE SUPP: 25.0000 mg | Freq: Two times a day (BID) | RECTAL | 0 refills | Status: DC

## 2019-01-29 NOTE — Patient Instructions (Signed)
Also start Colace once daily.

## 2019-01-29 NOTE — Progress Notes (Signed)
Patient: [name] Female    DOB: 1929/01/26   85 y.o.   MRN: [id number] Visit Date: 01/29/2019  Today's Provider: Wilhemena Durie, MD   Chief Complaint  Patient presents with  . Blood In Stools   Subjective:     HPI Patient comes in today c/o blood in her stools since this morning. She reports that she noticed bright red blood after wiping. She also reports that it was in her stool as well. She denies any other symptoms. Denies nausea, abdominal pain, diarrhea, or constipation. Her last colonoscopy was 12/2008, and it showed diverticulosis and internal hemorrhoids. No fevers or abdominal pain or vaginal bleeding or hematuria.  BP Readings from Last 3 Encounters:  01/29/19 138/78  11/10/18 (!) 142/64  11/07/18 (!) 156/70     Allergies  Allergen Reactions  . Neomycin-Bacitracin Zn-Polymyx Swelling and Rash  . Latex Rash  . Lidocaine Rash  . Aricept [Donepezil Hcl] Diarrhea  . Benzalkonium Chloride Itching and Swelling  . Ibuprofen     Other reaction(s): Dizziness Heart fluttering. Tachycardia. Tachycardia.  . Valdecoxib Nausea And Vomiting  . Albuterol Rash  . Tape Rash    blisters  . Triamcinolone Rash     Current Outpatient Medications:  .  Ascorbic Acid (VITAMIN C) 1000 MG tablet, Take 1,000 mg by mouth daily. , Disp: , Rfl:  .  aspirin 325 MG tablet, Take 325 mg by mouth daily. Reported on 01/05/2016, Disp: , Rfl:  .  ASSURE COMFORT LANCETS 30G MISC, , Disp: , Rfl:  .  Blood Glucose Calibration (ACCU-CHEK SMARTVIEW CONTROL) LIQD, , Disp: , Rfl:  .  cetirizine (ZYRTEC) 10 MG tablet, TAKE ONE TABLET BY MOUTH EVERY DAY, Disp: 90 tablet, Rfl: 3 .  diltiazem (TIAZAC) 360 MG 24 hr capsule, TAKE 1 CAPSULE BY MOUTH EVERY DAY, Disp: 90 capsule, Rfl: 3 .  ezetimibe (ZETIA) 10 MG tablet, TAKE 1 TABLET BY MOUTH DAILY (Patient taking differently: Take 10 mg by mouth daily. ), Disp: 30 tablet, Rfl: 12 .  FLUoxetine (PROZAC) 40 MG capsule, Take 1 capsule (40  mg total) by mouth daily., Disp: 30 capsule, Rfl: 12 .  fluticasone-salmeterol (ADVAIR HFA) 230-21 MCG/ACT inhaler, Inhale 2 puffs into the lungs 2 (two) times daily., Disp: 1 Inhaler, Rfl: 12 .  glucose blood test strip, ACCU-CHEK AVIVA PLUS TEST STRP Use to check sugars 3 times daily., Disp: , Rfl:  .  insulin glargine (LANTUS) 100 UNIT/ML injection, Inject 0.05 mLs (5 Units total) into the skin at bedtime. (Patient taking differently: Inject 7 Units into the skin at bedtime. ), Disp: 10 mL, Rfl: 11 .  insulin lispro (HUMALOG KWIKPEN) 100 UNIT/ML KiwkPen, Inject 6 Units into the skin 3 (three) times daily. 12 units in the am, 10 units at lunch and 12 units in the pm, Disp: , Rfl:  .  lansoprazole (PREVACID) 30 MG capsule, TAKE 1 CAPSULE BY MOUTH EVERY DAY AT NOON, Disp: 90 capsule, Rfl: 3 .  losartan (COZAAR) 100 MG tablet, TAKE ONE TABLET BY MOUTH EVERY DAY, Disp: 90 tablet, Rfl: 3 .  Magnesium 400 MG CAPS, Take by mouth daily., Disp: , Rfl:  .  memantine (NAMENDA) 5 MG tablet, TAKE ONE TABLET TWICE DAILY, Disp: 60 tablet, Rfl: 12 .  Multiple Vitamins-Minerals (ICAPS AREDS 2 PO), Take by mouth., Disp: , Rfl:  .  potassium chloride (K-DUR,KLOR-CON) 10 MEQ tablet, TAKE ONE TABLET BY MOUTH TWICE DAILY, Disp: 180 tablet, Rfl: 3 .  PROAIR HFA 108 (90 Base) MCG/ACT inhaler, INHALE 2 PUFFS INTO THE LUNGS EVERY 4 HOURS AS NEEDED FOR WHEEZING ORSHORTNESS OF BREATH, Disp: 8.5 g, Rfl: 2 .  sitaGLIPtin (JANUVIA) 50 MG tablet, Take 1 tablet (50 mg total) by mouth daily., Disp: 30 tablet, Rfl: 12 .  SURE COMFORT PEN NEEDLES 31G X 5 MM MISC, , Disp: , Rfl:  .  traZODone (DESYREL) 50 MG tablet, TAKE 1 AND 1/2 TABLET AT BEDTIME AS NEEDED FOR SLEEP, Disp: 45 tablet, Rfl: 11 .  vitamin E 1000 UNIT capsule, Take 1,000 Units by mouth daily., Disp: , Rfl:  .  benzonatate (TESSALON) 100 MG capsule, TAKE 1 CAPSULE 3 TIMES DAILY AS NEEDED FOR COUGH (Patient not taking: Reported on 01/29/2019), Disp: 90 capsule, Rfl: 0 .   cephALEXin (KEFLEX) 500 MG capsule, Take 1 capsule (500 mg total) by mouth 2 (two) times daily., Disp: 14 capsule, Rfl: 0  Review of Systems  Constitutional: Negative for diaphoresis and fatigue.  HENT: Negative.   Eyes: Negative.   Respiratory: Negative for cough.   Cardiovascular: Negative.   Gastrointestinal: Negative.   Endocrine: Negative.   Genitourinary: Negative.   Allergic/Immunologic: Negative.   Hematological: Negative.   Psychiatric/Behavioral: Negative.     Social History   Tobacco Use  . Smoking status: Never Smoker  . Smokeless tobacco: Never Used  Substance Use Topics  . Alcohol use: No      Objective:   BP 138/78 (BP Location: Left Arm, Patient Position: Sitting, Cuff Size: Normal)   Pulse 88   Temp 98.6 F (37 C)   Resp 16   Wt 146 lb (66.2 kg)   SpO2 96%   BMI 26.70 kg/m  Vitals:   01/29/19 1435  BP: 138/78  Pulse: 88  Resp: 16  Temp: 98.6 F (37 C)  SpO2: 96%  Weight: 146 lb (66.2 kg)     Physical Exam Vitals signs reviewed.  Constitutional:      Appearance: Normal appearance.  HENT:     Head: Normocephalic and atraumatic.  Eyes:     General: No scleral icterus. Cardiovascular:     Rate and Rhythm: Normal rate and regular rhythm.     Heart sounds: Normal heart sounds.  Pulmonary:     Effort: Pulmonary effort is normal.     Breath sounds: Normal breath sounds.  Abdominal:     Palpations: Abdomen is soft.  Genitourinary:    Rectum: Normal.     Comments: Internal hemorrhoid visualized. Skin:    General: Skin is warm and dry.     Comments: Very fair skin.  Neurological:     Mental Status: She is alert and oriented to person, place, and time.  Psychiatric:        Mood and Affect: Mood normal.        Behavior: Behavior normal.        Thought Content: Thought content normal.        Judgment: Judgment normal.         Assessment & Plan    1. Internal hemorrhoid After discussion will treat and refer if bleeding does not  resolve. - hydrocortisone (ANUSOL-HC) 25 MG suppository; Place 1 suppository (25 mg total) rectally 2 (two) times daily.  Dispense: 12 suppository; Refill: 0  2. Rectal bleeding  - CBC with Differential/Platelet - IFOBT POC (occult bld, rslt in office); Future - IFOBT POC (occult bld, rslt in office)  3.. Malignant melanoma of skin ([place])   4. Type 2  diabetes mellitus with diabetic nephropathy, with long-term current use of insulin ([place])        Wilhemena Durie, MD  [facility]

## 2019-01-30 LAB — CBC WITH DIFFERENTIAL/PLATELET
Basophils Absolute: 0.1 10*3/uL (ref 0.0–0.2)
Basos: 1 %
EOS (ABSOLUTE): 0.5 10*3/uL — ABNORMAL HIGH (ref 0.0–0.4)
Eos: 4 %
Hematocrit: 40 % (ref 34.0–46.6)
Hemoglobin: 13.3 g/dL (ref 11.1–15.9)
Immature Grans (Abs): 0 10*3/uL (ref 0.0–0.1)
Immature Granulocytes: 0 %
Lymphocytes Absolute: 2.5 10*3/uL (ref 0.7–3.1)
Lymphs: 24 %
MCH: 29.5 pg (ref 26.6–33.0)
MCHC: 33.3 g/dL (ref 31.5–35.7)
MCV: 89 fL (ref 79–97)
Monocytes Absolute: 0.7 10*3/uL (ref 0.1–0.9)
Monocytes: 7 %
Neutrophils Absolute: 6.8 10*3/uL (ref 1.4–7.0)
Neutrophils: 64 %
Platelets: 254 10*3/uL (ref 150–450)
RBC: 4.51 x10E6/uL (ref 3.77–5.28)
RDW: 12.5 % (ref 11.7–15.4)
WBC: 10.6 10*3/uL (ref 3.4–10.8)

## 2019-01-31 ENCOUNTER — Other Ambulatory Visit: Payer: Self-pay | Admitting: Physician Assistant

## 2019-01-31 DIAGNOSIS — J449 Chronic obstructive pulmonary disease, unspecified: Secondary | ICD-10-CM

## 2019-02-01 NOTE — Telephone Encounter (Signed)
Pharmacy requesting refills. Thanks!  

## 2019-02-07 ENCOUNTER — Other Ambulatory Visit: Payer: Self-pay | Admitting: Internal Medicine

## 2019-02-08 ENCOUNTER — Telehealth: Payer: Self-pay

## 2019-02-08 NOTE — Telephone Encounter (Signed)
Please review. Thanks!  

## 2019-02-08 NOTE — Telephone Encounter (Signed)
Patient states that she is having urinary frequency that started yesterday. She is going to the bathroom every 15 minutes. She would like some medication called in because she is unable to come in for an appointment.

## 2019-02-09 ENCOUNTER — Other Ambulatory Visit: Payer: Self-pay

## 2019-02-09 DIAGNOSIS — N3090 Cystitis, unspecified without hematuria: Secondary | ICD-10-CM

## 2019-02-09 MED ORDER — NITROFURANTOIN MONOHYD MACRO 100 MG PO CAPS: 100.0000 mg | ORAL_CAPSULE | Freq: Two times a day (BID) | ORAL | Status: DC

## 2019-02-09 MED ORDER — NITROFURANTOIN MACROCRYSTAL 100 MG PO CAPS: 100.0000 mg | ORAL_CAPSULE | Freq: Two times a day (BID) | ORAL | 0 refills | Status: DC

## 2019-02-09 MED ORDER — NITROFURANTOIN MONOHYD MACRO 100 MG PO CAPS: 100.0000 mg | ORAL_CAPSULE | Freq: Two times a day (BID) | ORAL | Status: AC

## 2019-02-09 NOTE — Telephone Encounter (Signed)
Nitrofurantoin BID for 3 days.

## 2019-02-09 NOTE — Telephone Encounter (Signed)
Rx sent to pharmacy. Patient was advised. 

## 2019-02-09 NOTE — Addendum Note (Signed)
Addended by: Julieta Bellini on: 02/09/2019 02:04 PM   Modules accepted: Orders

## 2019-03-01 ENCOUNTER — Other Ambulatory Visit: Payer: Self-pay

## 2019-03-01 ENCOUNTER — Ambulatory Visit (INDEPENDENT_AMBULATORY_CARE_PROVIDER_SITE_OTHER): Payer: Medicare Other

## 2019-03-01 DIAGNOSIS — Z0000 Encounter for general adult medical examination without abnormal findings: Secondary | ICD-10-CM

## 2019-03-01 NOTE — Patient Instructions (Addendum)
[name] , Thank you for taking time to come for your Medicare Wellness Visit. I appreciate your ongoing commitment to your health goals. Please review the following plan we discussed and let me know if I can assist you in the future.   Screening recommendations/referrals: Colonoscopy: No longer required.  Mammogram: No longer required.  Bone Density: Last Dexa was normal. No repeat needed.  Recommended yearly ophthalmology/optometry visit for glaucoma screening and checkup Recommended yearly dental visit for hygiene and checkup  Vaccinations: Influenza vaccine: Up to date Pneumococcal vaccine: Completed series Tdap vaccine: Up to date, due 06/2028 Shingles vaccine: Pt declines today.     Advanced directives: Please bring a copy of your POA (Power of Attorney) and/or Living Will to your next appointment.   Conditions/risks identified: None.   Next appointment: 03/05/19 @ 10:40 AM with Dr Rosanna Randy for an [place]. Declined scheduling an AWV for 2021 at this time.    Preventive Care 83 Years and Older, Female Preventive care refers to lifestyle choices and visits with your health care provider that can promote health and wellness. What does preventive care include?  A yearly physical exam. This is also called an annual well check.  Dental exams once or twice a year.  Routine eye exams. Ask your health care provider how often you should have your eyes checked.  Personal lifestyle choices, including:  Daily care of your teeth and gums.  Regular physical activity.  Eating a healthy diet.  Avoiding tobacco and drug use.  Limiting alcohol use.  Practicing safe sex.  Taking low-dose aspirin every day.  Taking vitamin and mineral supplements as recommended by your health care provider. What happens during an annual well check? The services and screenings done by your health care provider during your annual well check will depend on your age, overall health, lifestyle risk  factors, and family history of disease. Counseling  Your health care provider may ask you questions about your:  Alcohol use.  Tobacco use.  Drug use.  Emotional well-being.  Home and relationship well-being.  Sexual activity.  Eating habits.  History of falls.  Memory and ability to understand (cognition).  Work and work Statistician.  Reproductive health. Screening  You may have the following tests or measurements:  Height, weight, and BMI.  Blood pressure.  Lipid and cholesterol levels. These may be checked every 5 years, or more frequently if you are over 83 years old.  Skin check.  Lung cancer screening. You may have this screening every year starting at age 83 if you have a 30-pack-year history of smoking and currently smoke or have quit within the past 15 years.  Fecal occult blood test (FOBT) of the stool. You may have this test every year starting at age 83.  Flexible sigmoidoscopy or colonoscopy. You may have a sigmoidoscopy every 5 years or a colonoscopy every 10 years starting at age 83.  Hepatitis C blood test.  Hepatitis B blood test.  Sexually transmitted disease (STD) testing.  Diabetes screening. This is done by checking your blood sugar (glucose) after you have not eaten for a while (fasting). You may have this done every 1-3 years.  Bone density scan. This is done to screen for osteoporosis. You may have this done starting at age 83.  Mammogram. This may be done every 1-2 years. Talk to your health care provider about how often you should have regular mammograms. Talk with your health care provider about your test results, treatment options, and if necessary,  the need for more tests. Vaccines  Your health care provider may recommend certain vaccines, such as:  Influenza vaccine. This is recommended every year.  Tetanus, diphtheria, and acellular pertussis (Tdap, Td) vaccine. You may need a Td booster every 10 years.  Zoster vaccine. You  may need this after age 83.  Pneumococcal 13-valent conjugate (PCV13) vaccine. One dose is recommended after age 83.  Pneumococcal polysaccharide (PPSV23) vaccine. One dose is recommended after age 4. Talk to your health care provider about which screenings and vaccines you need and how often you need them. This information is not intended to replace advice given to you by your health care provider. Make sure you discuss any questions you have with your health care provider. Document Released: 10/10/2015 Document Revised: 06/02/2016 Document Reviewed: 07/15/2015 Elsevier Interactive Patient Education  2017 [place] Prevention in the Home Falls can cause injuries. They can happen to people of all ages. There are many things you can do to make your home safe and to help prevent falls. What can I do on the outside of my home?  Regularly fix the edges of walkways and driveways and fix any cracks.  Remove anything that might make you trip as you walk through a door, such as a raised step or threshold.  Trim any bushes or trees on the path to your home.  Use bright outdoor lighting.  Clear any walking paths of anything that might make someone trip, such as rocks or tools.  Regularly check to see if handrails are loose or broken. Make sure that both sides of any steps have handrails.  Any raised decks and porches should have guardrails on the edges.  Have any leaves, snow, or ice cleared regularly.  Use sand or salt on walking paths during winter.  Clean up any spills in your garage right away. This includes oil or grease spills. What can I do in the bathroom?  Use night lights.  Install grab bars by the toilet and in the tub and shower. Do not use towel bars as grab bars.  Use non-skid mats or decals in the tub or shower.  If you need to sit down in the shower, use a plastic, non-slip stool.  Keep the floor dry. Clean up any water that spills on the floor as soon as  it happens.  Remove soap buildup in the tub or shower regularly.  Attach bath mats securely with double-sided non-slip rug tape.  Do not have throw rugs and other things on the floor that can make you trip. What can I do in the bedroom?  Use night lights.  Make sure that you have a light by your bed that is easy to reach.  Do not use any sheets or blankets that are too big for your bed. They should not hang down onto the floor.  Have a firm chair that has side arms. You can use this for support while you get dressed.  Do not have throw rugs and other things on the floor that can make you trip. What can I do in the kitchen?  Clean up any spills right away.  Avoid walking on wet floors.  Keep items that you use a lot in easy-to-reach places.  If you need to reach something above you, use a strong step stool that has a grab bar.  Keep electrical cords out of the way.  Do not use floor polish or wax that makes floors slippery. If you must use  wax, use non-skid floor wax.  Do not have throw rugs and other things on the floor that can make you trip. What can I do with my stairs?  Do not leave any items on the stairs.  Make sure that there are handrails on both sides of the stairs and use them. Fix handrails that are broken or loose. Make sure that handrails are as long as the stairways.  Check any carpeting to make sure that it is firmly attached to the stairs. Fix any carpet that is loose or worn.  Avoid having throw rugs at the top or bottom of the stairs. If you do have throw rugs, attach them to the floor with carpet tape.  Make sure that you have a light switch at the top of the stairs and the bottom of the stairs. If you do not have them, ask someone to add them for you. What else can I do to help prevent falls?  Wear shoes that:  Do not have high heels.  Have rubber bottoms.  Are comfortable and fit you well.  Are closed at the toe. Do not wear sandals.  If  you use a stepladder:  Make sure that it is fully opened. Do not climb a closed stepladder.  Make sure that both sides of the stepladder are locked into place.  Ask someone to hold it for you, if possible.  Clearly mark and make sure that you can see:  Any grab bars or handrails.  First and last steps.  Where the edge of each step is.  Use tools that help you move around (mobility aids) if they are needed. These include:  Canes.  Walkers.  Scooters.  Crutches.  Turn on the lights when you go into a dark area. Replace any light bulbs as soon as they burn out.  Set up your furniture so you have a clear path. Avoid moving your furniture around.  If any of your floors are uneven, fix them.  If there are any pets around you, be aware of where they are.  Review your medicines with your doctor. Some medicines can make you feel dizzy. This can increase your chance of falling. Ask your doctor what other things that you can do to help prevent falls. This information is not intended to replace advice given to you by your health care provider. Make sure you discuss any questions you have with your health care provider. Document Released: 07/10/2009 Document Revised: 02/19/2016 Document Reviewed: 10/18/2014 Elsevier Interactive Patient Education  2017 [employer].

## 2019-03-01 NOTE — Progress Notes (Signed)
Subjective:   [name] is a 85 y.o. female who presents for Medicare Annual (Subsequent) preventive examination.    This visit is being conducted through telemedicine due to the COVID-19 pandemic. This patient has given me verbal consent via doximity to conduct this visit, patient states they are participating from their home address. Some vital signs may be absent or patient reported.    Patient identification: identified by name, DOB, and current address  Review of Systems:  N/A  Cardiac Risk Factors include: advanced age (>56men, >86 women);diabetes mellitus;dyslipidemia;hypertension     Objective:     Vitals: There were no vitals taken for this visit.  There is no height or weight on file to calculate BMI. Unable to obtain vitals due to visit being conducted via telephonically.   Advanced Directives 03/01/2019 08/23/2018 08/15/2018 11/03/2017 10/07/2016 09/28/2016 07/27/2016  Does Patient Have a Medical Advance Directive? Yes No No Yes Yes Yes Yes  Type of Paramedic of [place];Living will - - [place];Living will [place];Living will [place] will  Does patient want to make changes to medical advance directive? - - - - - - -  Copy of [place] in Chart? No - copy requested - - No - copy requested No - copy requested - -  Would patient like information on creating a medical advance directive? - No - Patient declined No - Patient declined - - - -    Tobacco Social History   Tobacco Use  Smoking Status Never Smoker  Smokeless Tobacco Never Used     Counseling given: Not Answered   Clinical Intake:  Pre-visit preparation completed: Yes  Pain : No/denies pain Pain Score: 0-No pain     Nutritional Status: BMI 25 -29 Overweight Nutritional Risks: None Diabetes: Yes  How often do you need to have someone help you when you read instructions, pamphlets,  or other written materials from your doctor or pharmacy?: 5 - Always(Due to Macular degeneration. )   Diabetes:  Is the patient diabetic?  Yes type 2 If diabetic, was a CBG obtained today?  No  Did the patient bring in their glucometer from home?  No  How often do you monitor your CBG's? Four times a day.   Financial Strains and Diabetes Management:  Are you having any financial strains with the device, your supplies or your medication? No .  Does the patient want to be seen by Chronic Care Management for management of their diabetes?  No  Would the patient like to be referred to a Nutritionist or for Diabetic Management?  No   Diabetic Exams:  Diabetic Eye Exam: Completed 07/31/18.   Diabetic Foot Exam: Completed 10/05/18.    Interpreter Needed?: No  Information entered by :: [facility], LPN  Past Medical History:  Diagnosis Date  . Angina pectoris ([place])   . Atrial fibrillation ([place])   . Cervicalgia   . Chronic back pain   . Chronic obstructive pulmonary disease (COPD) ([place])   . COPD (chronic obstructive pulmonary disease) ([place])   . DDD (degenerative disc disease), lumbar   . Diabetes mellitus without complication ([place])   . Dysphagia   . Hyperlipemia   . Hypertension   . Vulvovaginitis    Past Surgical History:  Procedure Laterality Date  . gall bladder    . melanoma     removal neck and back  . PERIPHERAL VASCULAR CATHETERIZATION Left 01/05/2016  Procedure: Lower Extremity Angiography;  Surgeon: Algernon Huxley, MD;  Location: [facility];  Service: Cardiovascular;  Laterality: Left;  . PERIPHERAL VASCULAR CATHETERIZATION  01/05/2016   Procedure: Lower Extremity Intervention;  Surgeon: Algernon Huxley, MD;  Location: [facility];  Service: Cardiovascular;;  . ureterolithiasis     calculus removed  . VAGINAL HYSTERECTOMY    . VULVA / PERINEUM BIOPSY  05/29/2015   Family History  Problem Relation Age of Onset  . Ovarian cancer Other   . Diabetes  Sister   . Colon cancer Brother   . Diabetes Brother   . Atrial fibrillation Daughter   . Breast cancer Neg Hx    Social History   Socioeconomic History  . Marital status: Widowed    Spouse name: Not on file  . Number of children: 3  . Years of education: Not on file  . Highest education level: 8th grade  Occupational History  . Occupation: retired  Scientific laboratory technician  . Financial resource strain: Not hard at all  . Food insecurity:    Worry: Never true    Inability: Never true  . Transportation needs:    Medical: No    Non-medical: No  Tobacco Use  . Smoking status: Never Smoker  . Smokeless tobacco: Never Used  Substance and Sexual Activity  . Alcohol use: No  . Drug use: No  . Sexual activity: Never  Lifestyle  . Physical activity:    Days per week: 0 days    Minutes per session: 0 min  . Stress: Not at all  Relationships  . Social connections:    Talks on phone: Patient refused    Gets together: Patient refused    Attends religious service: Patient refused    Active member of club or organization: Patient refused    Attends meetings of clubs or organizations: Patient refused    Relationship status: Patient refused  Other Topics Concern  . Not on file  Social History Narrative  . Not on file    Outpatient Encounter Medications as of 03/01/2019  Medication Sig  . Ascorbic Acid (VITAMIN C) 1000 MG tablet Take 1,000 mg by mouth daily.   Marland Kitchen aspirin 325 MG tablet Take 325 mg by mouth daily. Reported on 01/05/2016  . ASSURE COMFORT LANCETS 30G MISC   . benzonatate (TESSALON) 100 MG capsule TAKE 1 TABLET BY MOUTH 3 TIMES DAILY AS NEEDED FOR COUGH  . Blood Glucose Calibration (ACCU-CHEK SMARTVIEW CONTROL) LIQD   . cetirizine (ZYRTEC) 10 MG tablet TAKE ONE TABLET BY MOUTH EVERY DAY  . cholecalciferol (VITAMIN D3) 25 MCG (1000 UT) tablet Take 1,000 Units by mouth daily.  . clobetasol ointment (TEMOVATE) 0.86 % Apply 1 application topically daily. To face  . diltiazem  (TIAZAC) 360 MG 24 hr capsule TAKE 1 CAPSULE BY MOUTH EVERY DAY  . ezetimibe (ZETIA) 10 MG tablet TAKE 1 TABLET BY MOUTH DAILY (Patient taking differently: Take 10 mg by mouth daily. )  . FLUoxetine (PROZAC) 40 MG capsule Take 1 capsule (40 mg total) by mouth daily.  . fluticasone-salmeterol (ADVAIR HFA) 230-21 MCG/ACT inhaler Inhale 2 puffs into the lungs 2 (two) times daily.  Marland Kitchen glucose blood test strip ACCU-CHEK AVIVA PLUS TEST STRP Use to check sugars 3 times daily.  . insulin glargine (LANTUS) 100 UNIT/ML injection Inject 0.05 mLs (5 Units total) into the skin at bedtime. (Patient taking differently: Inject 10 Units into the skin at bedtime. )  . insulin lispro (  HUMALOG KWIKPEN) 100 UNIT/ML KiwkPen Inject 10 Units into the skin 4 (four) times daily.   . lansoprazole (PREVACID) 30 MG capsule TAKE 1 CAPSULE BY MOUTH EVERY DAY AT NOON  . losartan (COZAAR) 100 MG tablet TAKE ONE TABLET BY MOUTH EVERY DAY  . Magnesium 400 MG CAPS Take by mouth daily.  . memantine (NAMENDA) 5 MG tablet TAKE ONE TABLET TWICE DAILY  . Multiple Vitamins-Minerals (ICAPS AREDS 2 PO) Take by mouth 2 (two) times daily.   . potassium chloride (K-DUR,KLOR-CON) 10 MEQ tablet TAKE ONE TABLET BY MOUTH TWICE DAILY  . PROAIR HFA 108 (90 Base) MCG/ACT inhaler INHALE 2 PUFFS INTO THE LUNGS EVERY 4 HOURS AS NEEDED FOR WHEEZING ORSHORTNESS OF BREATH  . SURE COMFORT PEN NEEDLES 31G X 5 MM MISC   . traZODone (DESYREL) 50 MG tablet TAKE 1 AND 1/2 TABLET AT BEDTIME AS NEEDED FOR SLEEP  . vitamin E 1000 UNIT capsule Take 1,000 Units by mouth daily.  . cephALEXin (KEFLEX) 500 MG capsule Take 1 capsule (500 mg total) by mouth 2 (two) times daily. (Patient not taking: Reported on 03/01/2019)  . hydrocortisone (ANUSOL-HC) 25 MG suppository Place 1 suppository (25 mg total) rectally 2 (two) times daily. (Patient not taking: Reported on 03/01/2019)  . nitrofurantoin (MACRODANTIN) 100 MG capsule Take 1 capsule (100 mg total) by mouth 2 (two) times  daily. (Patient not taking: Reported on 03/01/2019)  . sitaGLIPtin (JANUVIA) 50 MG tablet Take 1 tablet (50 mg total) by mouth daily. (Patient not taking: Reported on 03/01/2019)   No facility-administered encounter medications on file as of 03/01/2019.     Activities of Daily Living In your present state of health, do you have any difficulty performing the following activities: 03/01/2019  Hearing? Y  Comment Wears bilateral hearing aids.   Vision? Y  Comment Unable to read up close due to MD.  Difficulty concentrating or making decisions? Y  Walking or climbing stairs? N  Dressing or bathing? N  Doing errands, shopping? Y  Comment Does not drive.   Preparing Food and eating ? N  Using the Toilet? N  In the past six months, have you accidently leaked urine? N  Do you have problems with loss of bowel control? N  Managing your Medications? N  Comment Pharmacy preps medication box for pt.   Managing your Finances? Y  Comment Son manages finances.   Housekeeping or managing your Housekeeping? Y  Comment Daughter helps with house work.   Some recent data might be hidden    Patient Care Team: Jerrol Banana., MD as PCP - General (Family Medicine) Gabriel Carina, Betsey Holiday, MD as Physician Assistant (Endocrinology) Dingeldein, Remo Lipps, MD as Consulting Physician (Ophthalmology) Lucky Cowboy, Erskine Squibb, MD as Referring Physician (Vascular Surgery) Corey Skains, MD as Consulting Physician (Cardiology) Ralene Bathe, MD as Consulting Physician (Dermatology) Gardiner Barefoot, DPM as Consulting Physician (Podiatry)    Assessment:   This is a routine wellness examination for [place].  Exercise Activities and Dietary recommendations Current Exercise Habits: The patient does not participate in regular exercise at present, Exercise limited by: None identified  Goals    . DIET - INCREASE WATER INTAKE     Recommend increasing water intake to 4 glasses a day.     . Increase water intake     Starting  10/07/2016,  I will continue to drink 6 glasses of water a day.       Fall Risk: Fall Risk  03/01/2019 11/07/2018 11/03/2017  10/07/2016 05/26/2016  Falls in the past year? 0 1 No No Yes  Number falls in past yr: - 0 - - 1  Injury with Fall? - 0 - - No    FALL RISK PREVENTION PERTAINING TO THE HOME:  Any stairs in or around the home? Yes  If so, are there any without handrails? Yes   Home free of loose throw rugs in walkways, pet beds, electrical cords, etc? Yes  Adequate lighting in your home to reduce risk of falls? Yes   ASSISTIVE DEVICES UTILIZED TO PREVENT FALLS:  Life alert? Yes  Use of a cane, walker or w/c? Yes  Grab bars in the bathroom? Yes  Shower chair or bench in shower? Yes  Elevated toilet seat or a handicapped toilet? No   TIMED UP AND GO:  Was the test performed? No .    Depression Screen PHQ 2/9 Scores 03/01/2019 11/07/2018 11/03/2017 11/03/2017  PHQ - 2 Score 1 0 0 0  PHQ- 9 Score - - 11 -     Cognitive Function MMSE - Mini Mental State Exam 06/07/2017  Orientation to time 5  Orientation to Place 5  Registration 3  Attention/ Calculation 5  Recall 2  Language- name 2 objects 2  Language- repeat 1  Language- follow 3 step command 2  Language- read & follow direction 0  Write a sentence 1  Copy design 1  Total score 27     6CIT Screen 03/01/2019 10/07/2016  What Year? 0 points -  What month? 0 points 0 points  What time? 0 points 0 points  Count back from 20 0 points 0 points  Months in reverse 4 points 0 points  Repeat phrase 0 points 4 points  Total Score 4 -    Immunization History  Administered Date(s) Administered  . Influenza Split 06/27/2010, 07/07/2011, 07/11/2012  . Influenza, High Dose Seasonal PF 07/13/2014, 06/24/2015, 06/16/2016, 07/16/2017, 06/27/2018  . Influenza,inj,Quad PF,6+ Mos 06/06/2013  . Pneumococcal Conjugate-13 11/28/2014  . Pneumococcal Polysaccharide-23 08/01/1995, 11/17/2012  . Td 05/20/2005, 06/27/2018    Qualifies  for Shingles Vaccine? Yes . Due for Shingrix. Education has been provided regarding the importance of this vaccine. Pt has been advised to call insurance company to determine out of pocket expense. Advised may also receive vaccine at local pharmacy or Health Dept. Verbalized acceptance and understanding.  Tdap: Up to date  Flu Vaccine: Up to date  Pneumococcal Vaccine: Completed series  Screening Tests Health Maintenance  Topic Date Due  . INFLUENZA VACCINE  04/28/2019  . HEMOGLOBIN A1C  05/09/2019  . OPHTHALMOLOGY EXAM  08/01/2019  . FOOT EXAM  10/06/2019  . TETANUS/TDAP  06/27/2028  . DEXA SCAN  Completed  . PNA vac Low Risk Adult  Completed    Cancer Screenings:  Colorectal Screening: No longer required.   Mammogram: No longer required.   Bone Density: Completed 12/31/14. Results reflect NORMAL.   Lung Cancer Screening: (Low Dose CT Chest recommended if Age 26-80 years, 30 pack-year currently smoking OR have quit w/in 15years.) does not qualify.   Additional Screening:  Dental Screening: Recommended annual dental exams for proper oral hygiene   Community Resource Referral:  CRR required this visit?  No       Plan:  I have personally reviewed and addressed the Medicare Annual Wellness questionnaire and have noted the following in the patient's chart:  A. Medical and social history B. Use of alcohol, tobacco or illicit drugs  C. Current medications and  supplements D. Functional ability and status E.  Nutritional status F.  Physical activity G. Advance directives H. List of other physicians I.  Hospitalizations, surgeries, and ER visits in previous 12 months J.  [place] such as hearing and vision if needed, cognitive and depression L. Referrals and appointments   In addition, I have reviewed and discussed with patient certain preventive protocols, quality metrics, and best practice recommendations. A written personalized care plan for preventive  services as well as general preventive health recommendations were provided to patient.   Glendora Score, [place]  11/27/2949 Nurse Health Advisor   Nurse Notes: Pt c/o of stomach cramps. Scheduled an OV for 03/05/19 @ 10:40. Advised pt to call office with any concerns before apt or go to ER with any major s/s.

## 2019-03-05 ENCOUNTER — Ambulatory Visit (INDEPENDENT_AMBULATORY_CARE_PROVIDER_SITE_OTHER): Payer: Medicare Other | Admitting: Family Medicine

## 2019-03-05 ENCOUNTER — Other Ambulatory Visit: Payer: Self-pay

## 2019-03-05 ENCOUNTER — Encounter: Payer: Self-pay | Admitting: Family Medicine

## 2019-03-05 VITALS — BP 184/74 | HR 78 | Temp 97.3°F | Resp 16 | Wt 148.6 lb

## 2019-03-05 DIAGNOSIS — I480 Paroxysmal atrial fibrillation: Secondary | ICD-10-CM

## 2019-03-05 DIAGNOSIS — G3183 Dementia with Lewy bodies: Secondary | ICD-10-CM

## 2019-03-05 DIAGNOSIS — R109 Unspecified abdominal pain: Secondary | ICD-10-CM

## 2019-03-05 DIAGNOSIS — E1121 Type 2 diabetes mellitus with diabetic nephropathy: Secondary | ICD-10-CM | POA: Diagnosis not present

## 2019-03-05 DIAGNOSIS — F0280 Dementia in other diseases classified elsewhere without behavioral disturbance: Secondary | ICD-10-CM

## 2019-03-05 DIAGNOSIS — Z794 Long term (current) use of insulin: Secondary | ICD-10-CM

## 2019-03-05 DIAGNOSIS — K625 Hemorrhage of anus and rectum: Secondary | ICD-10-CM | POA: Diagnosis not present

## 2019-03-05 LAB — POCT URINALYSIS DIPSTICK
Bilirubin, UA: NEGATIVE
Blood, UA: NEGATIVE
Glucose, UA: NEGATIVE
Ketones, UA: NEGATIVE
Nitrite, UA: NEGATIVE
Protein, UA: POSITIVE — AB
Spec Grav, UA: 1.02 (ref 1.010–1.025)
Urobilinogen, UA: 0.2 E.U./dL
pH, UA: 6 (ref 5.0–8.0)

## 2019-03-05 LAB — IFOBT (OCCULT BLOOD): IFOBT: NEGATIVE

## 2019-03-05 NOTE — Progress Notes (Signed)
Patient: [name] Female    DOB: [birth year]   85 y.o.   MRN: [id number] Visit Date: 03/05/2019  Today's Provider: Wilhemena Durie, MD   Chief Complaint  Patient presents with  . Abdominal Cramping  . Fall   Subjective:     Abdominal Cramping  This is a new problem. The current episode started 1 to 4 weeks ago. The onset quality is sudden. The problem occurs intermittently. The problem has been unchanged. The pain is located in the suprapubic region. The quality of the pain is cramping. Associated symptoms include arthralgias, belching, constipation, diarrhea and melena. Pertinent negatives include no anorexia, dysuria, fever, flatus, frequency, headaches, hematochezia, hematuria, myalgias, nausea, vomiting or weight loss. Nothing aggravates the pain. The pain is relieved by being still. She has tried acetaminophen for the symptoms. The treatment provided mild relief. Her past medical history is significant for abdominal surgery (gallbladder removed) and GERD.  Fall  The accident occurred 12 to 24 hours ago. Fall occurred: while sitting on a chair. She fell from a height of 1 to 2 ft. She landed on carpet. The volume of blood lost was minimal. The point of impact was the head (patient hit corner of shelf). The pain is present in the head and right lower arm. Pertinent negatives include no fever, headaches, hematuria, loss of consciousness, nausea or vomiting. She has tried acetaminophen for the symptoms.  Patient states she has had blood on occasion in the toilet.  She is having cramping discomfort as above.  Basically she is usually a poor historian.  She wants this evaluated and we talked about what it might mean. Regarding her fall she is feeling better.  She still has a little headache in the area of the hematoma but all of this is resolving. She continues to have cognitive issues that evidently family handles fairly well. Her blood sugars have been fairly steady and  stable.  Followed by endocrinology. Allergies  Allergen Reactions  . Neomycin-Bacitracin Zn-Polymyx Swelling and Rash  . Latex Rash  . Lidocaine Rash  . Aricept [Donepezil Hcl] Diarrhea  . Benzalkonium Chloride Itching and Swelling  . Ibuprofen     Other reaction(s): Dizziness Heart fluttering. Tachycardia. Tachycardia.  . Valdecoxib Nausea And Vomiting  . Albuterol Rash  . Tape Rash    blisters  . Triamcinolone Rash     Current Outpatient Medications:  .  Ascorbic Acid (VITAMIN C) 1000 MG tablet, Take 1,000 mg by mouth daily. , Disp: , Rfl:  .  aspirin 325 MG tablet, Take 325 mg by mouth daily. Reported on 01/05/2016, Disp: , Rfl:  .  ASSURE COMFORT LANCETS 30G MISC, , Disp: , Rfl:  .  Blood Glucose Calibration (ACCU-CHEK SMARTVIEW CONTROL) LIQD, , Disp: , Rfl:  .  cetirizine (ZYRTEC) 10 MG tablet, TAKE ONE TABLET BY MOUTH EVERY DAY, Disp: 90 tablet, Rfl: 3 .  cholecalciferol (VITAMIN D3) 25 MCG (1000 UT) tablet, Take 1,000 Units by mouth daily., Disp: , Rfl:  .  clobetasol ointment (TEMOVATE) 3.29 %, Apply 1 application topically daily. To face, Disp: , Rfl:  .  diltiazem (TIAZAC) 360 MG 24 hr capsule, TAKE 1 CAPSULE BY MOUTH EVERY DAY, Disp: 90 capsule, Rfl: 3 .  ezetimibe (ZETIA) 10 MG tablet, TAKE 1 TABLET BY MOUTH DAILY (Patient taking differently: Take 10 mg by mouth daily. ), Disp: 30 tablet, Rfl: 12 .  FLUoxetine (PROZAC) 40 MG capsule, Take 1 capsule (40 mg total) by  mouth daily., Disp: 30 capsule, Rfl: 12 .  fluticasone-salmeterol (ADVAIR HFA) 230-21 MCG/ACT inhaler, Inhale 2 puffs into the lungs 2 (two) times daily., Disp: 1 Inhaler, Rfl: 12 .  glucose blood test strip, ACCU-CHEK AVIVA PLUS TEST STRP Use to check sugars 3 times daily., Disp: , Rfl:  .  hydrocortisone (ANUSOL-HC) 25 MG suppository, Place 1 suppository (25 mg total) rectally 2 (two) times daily., Disp: 12 suppository, Rfl: 0 .  insulin glargine (LANTUS) 100 UNIT/ML injection, Inject 0.05 mLs (5 Units  total) into the skin at bedtime. (Patient taking differently: Inject 10 Units into the skin at bedtime. ), Disp: 10 mL, Rfl: 11 .  insulin lispro (HUMALOG KWIKPEN) 100 UNIT/ML KiwkPen, Inject 10 Units into the skin 4 (four) times daily. , Disp: , Rfl:  .  lansoprazole (PREVACID) 30 MG capsule, TAKE 1 CAPSULE BY MOUTH EVERY DAY AT NOON, Disp: 90 capsule, Rfl: 3 .  losartan (COZAAR) 100 MG tablet, TAKE ONE TABLET BY MOUTH EVERY DAY, Disp: 90 tablet, Rfl: 3 .  Magnesium 400 MG CAPS, Take by mouth daily., Disp: , Rfl:  .  memantine (NAMENDA) 5 MG tablet, TAKE ONE TABLET TWICE DAILY, Disp: 60 tablet, Rfl: 12 .  Multiple Vitamins-Minerals (ICAPS AREDS 2 PO), Take by mouth 2 (two) times daily. , Disp: , Rfl:  .  nitrofurantoin (MACRODANTIN) 100 MG capsule, Take 1 capsule (100 mg total) by mouth 2 (two) times daily., Disp: 6 capsule, Rfl: 0 .  potassium chloride (K-DUR,KLOR-CON) 10 MEQ tablet, TAKE ONE TABLET BY MOUTH TWICE DAILY, Disp: 180 tablet, Rfl: 3 .  PROAIR HFA 108 (90 Base) MCG/ACT inhaler, INHALE 2 PUFFS INTO THE LUNGS EVERY 4 HOURS AS NEEDED FOR WHEEZING ORSHORTNESS OF BREATH, Disp: 8.5 g, Rfl: 0 .  sitaGLIPtin (JANUVIA) 50 MG tablet, Take 1 tablet (50 mg total) by mouth daily., Disp: 30 tablet, Rfl: 12 .  SURE COMFORT PEN NEEDLES 31G X 5 MM MISC, , Disp: , Rfl:  .  traZODone (DESYREL) 50 MG tablet, TAKE 1 AND 1/2 TABLET AT BEDTIME AS NEEDED FOR SLEEP, Disp: 45 tablet, Rfl: 11 .  vitamin E 1000 UNIT capsule, Take 1,000 Units by mouth daily., Disp: , Rfl:  .  benzonatate (TESSALON) 100 MG capsule, TAKE 1 TABLET BY MOUTH 3 TIMES DAILY AS NEEDED FOR COUGH (Patient not taking: Reported on 03/05/2019), Disp: 90 capsule, Rfl: 0 .  cephALEXin (KEFLEX) 500 MG capsule, Take 1 capsule (500 mg total) by mouth 2 (two) times daily. (Patient not taking: Reported on 03/05/2019), Disp: 14 capsule, Rfl: 0  Review of Systems  Constitutional: Negative for fever and weight loss.  Eyes: Negative.   Respiratory:  Negative.   Cardiovascular: Negative.   Gastrointestinal: Positive for constipation, diarrhea and melena. Negative for anorexia, flatus, hematochezia, nausea and vomiting.  Endocrine: Negative.   Genitourinary: Negative for dysuria, frequency and hematuria.  Musculoskeletal: Positive for arthralgias. Negative for myalgias.  Allergic/Immunologic: Negative.   Neurological: Negative for loss of consciousness and headaches.  Hematological: Negative.   Psychiatric/Behavioral: Negative.     Social History   Tobacco Use  . Smoking status: Never Smoker  . Smokeless tobacco: Never Used  Substance Use Topics  . Alcohol use: No      Objective:   BP (!) 184/74   Pulse 78   Temp (!) 97.3 F (36.3 C) (Oral)   Resp 16   Wt 148 lb 9.6 oz (67.4 kg)   BMI 27.18 kg/m  Vitals:   03/05/19 1037  BP: Marland Kitchen)  184/74  Pulse: 78  Resp: 16  Temp: (!) 97.3 F (36.3 C)  TempSrc: Oral  Weight: 148 lb 9.6 oz (67.4 kg)     Physical Exam Vitals signs reviewed.  Constitutional:      Appearance: Normal appearance.  HENT:     Head: Normocephalic and atraumatic.     Comments: She has a small hematoma of the right occiput and a scrape below this just above the hairline on the right lower scalp.  This is an abrasion and not a cut.  This is healing nicely without it and any infection    Right Ear: External ear normal.     Left Ear: External ear normal.  Eyes:     General: No scleral icterus.    Conjunctiva/sclera: Conjunctivae normal.  Neck:     Musculoskeletal: No neck rigidity or muscular tenderness.  Cardiovascular:     Rate and Rhythm: Normal rate and regular rhythm.     Heart sounds: Normal heart sounds.  Pulmonary:     Effort: Pulmonary effort is normal.     Breath sounds: Normal breath sounds.  Abdominal:     General: There is no distension.     Palpations: Abdomen is soft. There is no mass.  Genitourinary:    Rectum: Normal.     Comments: Perianal area is visualized and this is  completely normal today.  DRE is also normal with stool that is OC light negative Lymphadenopathy:     Cervical: No cervical adenopathy.  Skin:    General: Skin is warm and dry.     Comments: Very fair skin.  Neurological:     Mental Status: She is alert and oriented to person, place, and time.  Psychiatric:        Mood and Affect: Mood normal.        Behavior: Behavior normal.        Thought Content: Thought content normal.        Judgment: Judgment normal.         Assessment & Plan    1. Rectal bleeding Normal abdominal and rectal exam today.  Stool is OC light negative.  At patient request referral back to GI.  Will treat for possible internal hemorrhoids with suppositories pending referral. - Comprehensive metabolic panel - CBC with Differential/Platelet - Ambulatory referral to Gastroenterology - IFOBT POC (occult bld, rslt in office); Future - IFOBT POC (occult bld, rslt in office)  2. Abdominal cramping Completely normal exam today. - Comprehensive metabolic panel - CBC with Differential/Platelet - POCT urinalysis dipstick - Urine Culture - Ambulatory referral to Gastroenterology   3. Paroxysmal atrial fibrillation (HCC)   4. Type 2 diabetes mellitus with diabetic nephropathy, with long-term current use of insulin ([place])   5. Lewy body dementia without behavioral disturbance ([place]) This could be affecting how she is presenting with these complaints.  She has no behavioral issues.      Cranford Mon, MD  [facility] Fritzi Mandes [place],acting as a scribe for Wilhemena Durie, MD.,have documented all relevant documentation on the behalf of Wilhemena Durie, MD,as directed by  Wilhemena Durie, MD while in the presence of Wilhemena Durie, MD.

## 2019-03-06 LAB — CBC WITH DIFFERENTIAL/PLATELET
Basophils Absolute: 0.1 10*3/uL (ref 0.0–0.2)
Basos: 1 %
EOS (ABSOLUTE): 0.8 10*3/uL — ABNORMAL HIGH (ref 0.0–0.4)
Eos: 7 %
Hematocrit: 39.5 % (ref 34.0–46.6)
Hemoglobin: 12.9 g/dL (ref 11.1–15.9)
Immature Grans (Abs): 0 10*3/uL (ref 0.0–0.1)
Immature Granulocytes: 0 %
Lymphocytes Absolute: 3 10*3/uL (ref 0.7–3.1)
Lymphs: 28 %
MCH: 29 pg (ref 26.6–33.0)
MCHC: 32.7 g/dL (ref 31.5–35.7)
MCV: 89 fL (ref 79–97)
Monocytes Absolute: 1.1 10*3/uL — ABNORMAL HIGH (ref 0.1–0.9)
Monocytes: 10 %
Neutrophils Absolute: 5.7 10*3/uL (ref 1.4–7.0)
Neutrophils: 54 %
Platelets: 250 10*3/uL (ref 150–450)
RBC: 4.45 x10E6/uL (ref 3.77–5.28)
RDW: 13.3 % (ref 11.7–15.4)
WBC: 10.6 10*3/uL (ref 3.4–10.8)

## 2019-03-06 LAB — COMPREHENSIVE METABOLIC PANEL
ALT: 20 IU/L (ref 0–32)
AST: 21 IU/L (ref 0–40)
Albumin/Globulin Ratio: 1.8 (ref 1.2–2.2)
Albumin: 4.1 g/dL (ref 3.6–4.6)
Alkaline Phosphatase: 108 IU/L (ref 39–117)
BUN/Creatinine Ratio: 13 (ref 12–28)
BUN: 16 mg/dL (ref 8–27)
Bilirubin Total: 0.3 mg/dL (ref 0.0–1.2)
CO2: 24 mmol/L (ref 20–29)
Calcium: 8.9 mg/dL (ref 8.7–10.3)
Chloride: 96 mmol/L (ref 96–106)
Creatinine, Ser: 1.22 mg/dL — ABNORMAL HIGH (ref 0.57–1.00)
GFR calc Af Amer: 46 mL/min/{1.73_m2} — ABNORMAL LOW (ref >59)
GFR calc non Af Amer: 40 mL/min/{1.73_m2} — ABNORMAL LOW (ref >59)
Globulin, Total: 2.3 g/dL (ref 1.5–4.5)
Glucose: 124 mg/dL — ABNORMAL HIGH (ref 65–99)
Potassium: 4.1 mmol/L (ref 3.5–5.2)
Sodium: 136 mmol/L (ref 134–144)
Total Protein: 6.4 g/dL (ref 6.0–8.5)

## 2019-03-07 ENCOUNTER — Telehealth: Payer: Self-pay

## 2019-03-07 LAB — URINE CULTURE

## 2019-03-07 NOTE — Telephone Encounter (Signed)
Patient advised.KW 

## 2019-03-07 NOTE — Telephone Encounter (Signed)
-----   Message from Jerrol Banana., MD sent at 03/07/2019  9:59 AM EDT ----- No UTI

## 2019-03-08 MED ORDER — HYDROCORTISONE ACETATE 25 MG RE SUPP: 25.0000 mg | Freq: Every evening | RECTAL | 0 refills | Status: DC | PRN

## 2019-03-14 ENCOUNTER — Telehealth: Payer: Self-pay | Admitting: Family Medicine

## 2019-03-14 NOTE — Chronic Care Management (AMB) (Signed)
Chronic Care Management   Note  03/14/2019 Name: [name] MRN: [id number] DOB: December 19, 1928  [name] is a 85 y.o. year old female who is a primary care patient of Jerrol Banana., MD. I reached out to [name] by phone today in response to a referral sent by [name]'s health plan.    [name] was given information about Chronic Care Management services today including:  1. CCM service includes personalized support from designated clinical staff supervised by her physician, including individualized plan of care and coordination with other care providers 2. 24/7 contact phone numbers for assistance for urgent and routine care needs. 3. Service will only be billed when office clinical staff spend 20 minutes or more in a month to coordinate care. 4. Only one practitioner may furnish and bill the service in a calendar month. 5. The patient may stop CCM services at any time (effective at the end of the month) by phone call to the office staff. 6. The patient will be responsible for cost sharing (co-pay) of up to 20% of the service fee (after annual deductible is met).  Patients daughter [name] agreed to services and verbal consent obtained.   Follow up plan: Telephone appointment with CCM team member scheduled for: 03/22/2019  [place]  ??[email or web address]   ??[phone]

## 2019-03-22 ENCOUNTER — Telehealth: Payer: Medicare Other

## 2019-03-26 ENCOUNTER — Other Ambulatory Visit: Payer: Self-pay | Admitting: Nurse Practitioner

## 2019-03-26 DIAGNOSIS — R748 Abnormal levels of other serum enzymes: Secondary | ICD-10-CM | POA: Diagnosis not present

## 2019-03-26 DIAGNOSIS — Z8719 Personal history of other diseases of the digestive system: Secondary | ICD-10-CM | POA: Insufficient documentation

## 2019-03-26 DIAGNOSIS — R1032 Left lower quadrant pain: Secondary | ICD-10-CM | POA: Diagnosis not present

## 2019-03-26 DIAGNOSIS — R1031 Right lower quadrant pain: Secondary | ICD-10-CM | POA: Diagnosis not present

## 2019-03-26 DIAGNOSIS — E1122 Type 2 diabetes mellitus with diabetic chronic kidney disease: Secondary | ICD-10-CM | POA: Diagnosis not present

## 2019-04-04 ENCOUNTER — Ambulatory Visit
Admission: RE | Admit: 2019-04-04 | Discharge: 2019-04-04 | Disposition: A | Discharge status: Home / Self Care | Payer: Medicare Other | Source: Ambulatory Visit | Attending: Nurse Practitioner | Admitting: Nurse Practitioner

## 2019-04-04 ENCOUNTER — Telehealth: Payer: Self-pay

## 2019-04-04 ENCOUNTER — Other Ambulatory Visit: Payer: Self-pay

## 2019-04-04 DIAGNOSIS — K921 Melena: Secondary | ICD-10-CM | POA: Diagnosis not present

## 2019-04-04 DIAGNOSIS — R1031 Right lower quadrant pain: Secondary | ICD-10-CM | POA: Insufficient documentation

## 2019-04-04 MED ORDER — IOHEXOL 300 MG/ML  SOLN
75.0000 mL | Freq: Once | INTRAMUSCULAR | Status: AC | PRN
  Administered 2019-04-04: 75 mL via INTRAVENOUS

## 2019-04-05 ENCOUNTER — Ambulatory Visit (INDEPENDENT_AMBULATORY_CARE_PROVIDER_SITE_OTHER): Payer: Medicare Other | Admitting: Family Medicine

## 2019-04-05 ENCOUNTER — Encounter: Payer: Self-pay | Admitting: Family Medicine

## 2019-04-05 VITALS — BP 162/80 | HR 83 | Temp 98.3°F | Wt 149.0 lb

## 2019-04-05 DIAGNOSIS — R0989 Other specified symptoms and signs involving the circulatory and respiratory systems: Secondary | ICD-10-CM | POA: Diagnosis not present

## 2019-04-05 DIAGNOSIS — R42 Dizziness and giddiness: Secondary | ICD-10-CM | POA: Diagnosis not present

## 2019-04-05 DIAGNOSIS — K625 Hemorrhage of anus and rectum: Secondary | ICD-10-CM | POA: Diagnosis not present

## 2019-04-05 DIAGNOSIS — E1121 Type 2 diabetes mellitus with diabetic nephropathy: Secondary | ICD-10-CM | POA: Diagnosis not present

## 2019-04-05 NOTE — Progress Notes (Signed)
[name]  MRN: [id number] DOB: [birth year]  Subjective:  HPI   The patient is an 85 year old female who presents for evaluation of dizziness/vertigo.  She is here with her son and they report that this has been going on for about 1 week.   It is of note that about 3 weeks ago she fell and hit her head. She states other than being a little sore she did not have any symptoms. The patient's son states that she has not been drinking enough fluid and yesterday she had CT and did not know that she was to drink additional fluid to flush it out.  Patient Active Problem List   Diagnosis Date Noted  . Arthropathy of lumbar facet joint 09/04/2018  . Degeneration of lumbar intervertebral disc 09/04/2018  . Spondylolisthesis, grade 1 09/04/2018  . Lewy body dementia ([place]) 02/17/2018  . PVD (peripheral vascular disease) ([place]) 08/16/2017  . Pain in limb 07/27/2016  . Hallucinations 07/07/2016  . Chest pain 07/01/2016  . Depression 06/23/2016  . Hypokalemia 09/03/2015  . Insomnia 07/23/2015  . Headache 07/23/2015  . Type 2 diabetes mellitus ([place]) 06/24/2015  . Chronic vulvitis 06/02/2015  . Allergic rhinitis 05/30/2015  . Back ache 05/30/2015  . Body mass index (BMI) of 29.0-29.9 in adult 05/30/2015  . Edema extremities 05/30/2015  . Dilatation of esophagus 05/30/2015  . Fall 05/30/2015  . H/O deep venous thrombosis 05/30/2015  . Hypercholesteremia 05/30/2015  . Heart & renal disease, hypertensive, with heart failure ([place]) 05/30/2015  . Below normal amount of sodium in the blood 05/30/2015  . Calculus of kidney 05/30/2015  . Gonalgia 05/30/2015  . Psoriasis 05/30/2015  . Avitaminosis D 05/30/2015  . Cough 04/01/2015  . Combined fat and carbohydrate induced hyperlipemia 12/30/2014  . Paroxysmal atrial fibrillation ([place]) 07/10/2014  . Abnormal gait 07/08/2014  . Anxiety state 07/08/2014  . Chronic kidney disease 07/08/2014  . Acid reflux 07/08/2014  . Cutaneous malignant  melanoma ([place]) 07/08/2014  . Chronic obstructive pulmonary disease ([place]) 07/08/2014  . Type II diabetes mellitus with renal manifestations ([place]) 07/08/2014  . Benign essential HTN 06/26/2014  . Carotid artery narrowing 06/26/2014  . Intermittent claudication ([place]) 06/26/2014  . Basal cell carcinoma of face 05/01/2014  . Disease of female genital organs 11/28/2012  . Incomplete bladder emptying 09/05/2012  . Excessive urination at night 08/15/2012  . Basal cell carcinoma of ear 10/26/2011   Past Medical History:  Diagnosis Date  . Angina pectoris ([place])   . Atrial fibrillation ([place])   . Cervicalgia   . Chronic back pain   . Chronic obstructive pulmonary disease (COPD) ([place])   . COPD (chronic obstructive pulmonary disease) ([place])   . DDD (degenerative disc disease), lumbar   . Diabetes mellitus without complication ([place])   . Dysphagia   . Hyperlipemia   . Hypertension   . Vulvovaginitis    Social History   Socioeconomic History  . Marital status: Widowed    Spouse name: Not on file  . Number of children: 3  . Years of education: Not on file  . Highest education level: 8th grade  Occupational History  . Occupation: retired  Scientific laboratory technician  . Financial resource strain: Not hard at all  . Food insecurity    Worry: Never true    Inability: Never true  . Transportation needs    Medical: No    Non-medical: No  Tobacco Use  . Smoking status: Never Smoker  . Smokeless tobacco: Never  Used  Substance and Sexual Activity  . Alcohol use: No  . Drug use: No  . Sexual activity: Never  Lifestyle  . Physical activity    Days per week: 0 days    Minutes per session: 0 min  . Stress: Not at all  Relationships  . Social Herbalist on phone: Patient refused    Gets together: Patient refused    Attends religious service: Patient refused    Active member of club or organization: Patient refused    Attends meetings of clubs or organizations: Patient refused     Relationship status: Patient refused  . Intimate partner violence    Fear of current or ex partner: Patient refused    Emotionally abused: Patient refused    Physically abused: Patient refused    Forced sexual activity: Patient refused  Other Topics Concern  . Not on file  Social History Narrative  . Not on file   Outpatient Encounter Medications as of 04/05/2019  Medication Sig  . Ascorbic Acid (VITAMIN C) 1000 MG tablet Take 1,000 mg by mouth daily.   Marland Kitchen aspirin 325 MG tablet Take 325 mg by mouth daily. Reported on 01/05/2016  . ASSURE COMFORT LANCETS 30G MISC   . Blood Glucose Calibration (ACCU-CHEK SMARTVIEW CONTROL) LIQD   . cetirizine (ZYRTEC) 10 MG tablet TAKE ONE TABLET BY MOUTH EVERY DAY  . cholecalciferol (VITAMIN D3) 25 MCG (1000 UT) tablet Take 1,000 Units by mouth daily.  . clobetasol ointment (TEMOVATE) 8.93 % Apply 1 application topically daily. To face  . diltiazem (TIAZAC) 360 MG 24 hr capsule TAKE 1 CAPSULE BY MOUTH EVERY DAY  . ezetimibe (ZETIA) 10 MG tablet TAKE 1 TABLET BY MOUTH DAILY (Patient taking differently: Take 10 mg by mouth daily. )  . FLUoxetine (PROZAC) 40 MG capsule Take 1 capsule (40 mg total) by mouth daily.  . fluticasone-salmeterol (ADVAIR HFA) 230-21 MCG/ACT inhaler Inhale 2 puffs into the lungs 2 (two) times daily.  Marland Kitchen glucose blood test strip ACCU-CHEK AVIVA PLUS TEST STRP Use to check sugars 3 times daily.  . hydrocortisone (ANUSOL-HC) 25 MG suppository Place 1 suppository (25 mg total) rectally at bedtime as needed for hemorrhoids.  . insulin glargine (LANTUS) 100 UNIT/ML injection Inject 0.05 mLs (5 Units total) into the skin at bedtime. (Patient taking differently: Inject 10 Units into the skin at bedtime. )  . insulin lispro (HUMALOG KWIKPEN) 100 UNIT/ML KiwkPen Inject 10 Units into the skin 4 (four) times daily.   Marland Kitchen losartan (COZAAR) 100 MG tablet TAKE ONE TABLET BY MOUTH EVERY DAY  . Magnesium 400 MG CAPS Take by mouth daily.  . memantine  (NAMENDA) 5 MG tablet TAKE ONE TABLET TWICE DAILY  . Multiple Vitamins-Minerals (ICAPS AREDS 2 PO) Take by mouth 2 (two) times daily.   . nitrofurantoin (MACRODANTIN) 100 MG capsule Take 1 capsule (100 mg total) by mouth 2 (two) times daily.  . potassium chloride (K-DUR,KLOR-CON) 10 MEQ tablet TAKE ONE TABLET BY MOUTH TWICE DAILY  . PROAIR HFA 108 (90 Base) MCG/ACT inhaler INHALE 2 PUFFS INTO THE LUNGS EVERY 4 HOURS AS NEEDED FOR WHEEZING ORSHORTNESS OF BREATH  . sitaGLIPtin (JANUVIA) 50 MG tablet Take 1 tablet (50 mg total) by mouth daily.  . SURE COMFORT PEN NEEDLES 31G X 5 MM MISC   . traZODone (DESYREL) 50 MG tablet TAKE 1 AND 1/2 TABLET AT BEDTIME AS NEEDED FOR SLEEP  . vitamin E 1000 UNIT capsule Take 1,000 Units by mouth  daily.  . lansoprazole (PREVACID) 30 MG capsule TAKE 1 CAPSULE BY MOUTH EVERY DAY AT NOON  . [DISCONTINUED] benzonatate (TESSALON) 100 MG capsule TAKE 1 TABLET BY MOUTH 3 TIMES DAILY AS NEEDED FOR COUGH (Patient not taking: Reported on 03/05/2019)  . [DISCONTINUED] cephALEXin (KEFLEX) 500 MG capsule Take 1 capsule (500 mg total) by mouth 2 (two) times daily. (Patient not taking: Reported on 03/05/2019)   No facility-administered encounter medications on file as of 04/05/2019.    Allergies  Allergen Reactions  . Neomycin-Bacitracin Zn-Polymyx Swelling and Rash  . Latex Rash  . Lidocaine Rash  . Aricept [Donepezil Hcl] Diarrhea  . Benzalkonium Chloride Itching and Swelling  . Ibuprofen     Other reaction(s): Dizziness Heart fluttering. Tachycardia. Tachycardia.  . Valdecoxib Nausea And Vomiting  . Albuterol Rash  . Tape Rash    blisters  . Triamcinolone Rash   Review of Systems  Constitutional: Negative for fever.  Respiratory: Positive for cough (COPD). Negative for shortness of breath and wheezing.   Cardiovascular: Negative for chest pain, palpitations and leg swelling.  Neurological: Positive for dizziness. Negative for headaches.    Objective:  BP (!)  162/80 (BP Location: Right Arm, Patient Position: Sitting, Cuff Size: Normal)   Pulse 83   Temp 98.3 F (36.8 C) (Oral)   Wt 149 lb (67.6 kg)   SpO2 98%   BMI 27.25 kg/m   Physical Exam  Assessment and Plan :  1. Dizziness Has felt a little extra off balance the past week. No syncope. Went to get mail out of her mailbox and fell 3 weeks ago. No vertigo or head injury. Feels the peripheral neuropathy in her lower legs and feet causes balance problems and easy to fall. Fair ability to ambulate with a cane today. Worse balance if she closes her eyes. No hematemesis, nausea, vomiting or diarrhea. Under evaluation by gastroenterologist for blood in stools. Recommend she drink extra fluids and follow diabetic diet. Should be certain how much insulin she uses and not let glucose get too low. Recheck CBC and CMP. Follow up pending reports. - CBC with Differential/Platelet - Comprehensive metabolic panel  2. Bilateral carotid bruits Bilateral bruits unchanged from previous finding. With recent dizziness, may need to have doppler reassessment.  3. Type 2 diabetes mellitus with diabetic nephropathy, without long-term current use of insulin (HCC) Followed regularly by Dr. Gabriel Carina (endocrinologist) with reasonable control for her advanced age. High risk for hypoglycemia and had drop to 45 1 year ago with severe confusion. Foot exam today does not show any ulcers or calluses. Decreased sensation dorsum of foot and heels of both feet which causes more difficulties with balance. Presently on Lantus 10 mg hs and Humalog 10 units before meals. Recheck labs to assess renal function, blood cell counts and CMP with hgb A1C. - CBC with Differential/Platelet - Comprehensive metabolic panel - Hemoglobin A1c  4. Rectal bleeding Had CT scan with contrast by Dr. Vira Agar (GI) yesterday to assess blood in stools recently. No acute findings to explain blood in stools. Will recheck CBC and should follow up with  gastroenterologist. - CBC with Differential/Platelet

## 2019-04-05 NOTE — Telephone Encounter (Signed)
Daughter called stating that the patient called her and said she is having dizziness and weakness.  She had a CT earlier this morning of her abdomen.  She states she is unable to bring the patient in today.  Advised the daughter that she should probably be seen by someone today and the daughter agreed and said when she got back in town she would see about having her brought to an Urgent Care.  I advised the daughter that if she was not seen today to call back tomorrow and we could see her.

## 2019-04-06 ENCOUNTER — Telehealth: Payer: Self-pay

## 2019-04-06 ENCOUNTER — Other Ambulatory Visit: Payer: Self-pay | Admitting: Family Medicine

## 2019-04-06 LAB — CBC WITH DIFFERENTIAL/PLATELET
Basophils Absolute: 0.1 10*3/uL (ref 0.0–0.2)
Basos: 1 %
EOS (ABSOLUTE): 0.3 10*3/uL (ref 0.0–0.4)
Eos: 3 %
Hematocrit: 40.8 % (ref 34.0–46.6)
Hemoglobin: 13.3 g/dL (ref 11.1–15.9)
Immature Grans (Abs): 0 10*3/uL (ref 0.0–0.1)
Immature Granulocytes: 0 %
Lymphocytes Absolute: 3.6 10*3/uL — ABNORMAL HIGH (ref 0.7–3.1)
Lymphs: 36 %
MCH: 28.7 pg (ref 26.6–33.0)
MCHC: 32.6 g/dL (ref 31.5–35.7)
MCV: 88 fL (ref 79–97)
Monocytes Absolute: 1 10*3/uL — ABNORMAL HIGH (ref 0.1–0.9)
Monocytes: 9 %
Neutrophils Absolute: 5.3 10*3/uL (ref 1.4–7.0)
Neutrophils: 51 %
Platelets: 279 10*3/uL (ref 150–450)
RBC: 4.63 x10E6/uL (ref 3.77–5.28)
RDW: 13.8 % (ref 11.7–15.4)
WBC: 10.2 10*3/uL (ref 3.4–10.8)

## 2019-04-06 LAB — COMPREHENSIVE METABOLIC PANEL
ALT: 23 IU/L (ref 0–32)
AST: 22 IU/L (ref 0–40)
Albumin/Globulin Ratio: 1.9 (ref 1.2–2.2)
Albumin: 4.4 g/dL (ref 3.6–4.6)
Alkaline Phosphatase: 112 IU/L (ref 39–117)
BUN/Creatinine Ratio: 17 (ref 12–28)
BUN: 19 mg/dL (ref 8–27)
Bilirubin Total: 0.2 mg/dL (ref 0.0–1.2)
CO2: 26 mmol/L (ref 20–29)
Calcium: 9.5 mg/dL (ref 8.7–10.3)
Chloride: 95 mmol/L — ABNORMAL LOW (ref 96–106)
Creatinine, Ser: 1.13 mg/dL — ABNORMAL HIGH (ref 0.57–1.00)
GFR calc Af Amer: 50 mL/min/{1.73_m2} — ABNORMAL LOW (ref >59)
GFR calc non Af Amer: 43 mL/min/{1.73_m2} — ABNORMAL LOW (ref >59)
Globulin, Total: 2.3 g/dL (ref 1.5–4.5)
Glucose: 75 mg/dL (ref 65–99)
Potassium: 4.2 mmol/L (ref 3.5–5.2)
Sodium: 138 mmol/L (ref 134–144)
Total Protein: 6.7 g/dL (ref 6.0–8.5)

## 2019-04-06 LAB — HEMOGLOBIN A1C
Est. average glucose Bld gHb Est-mCnc: 180 mg/dL
Hgb A1c MFr Bld: 7.9 % — ABNORMAL HIGH (ref 4.8–5.6)

## 2019-04-06 NOTE — Telephone Encounter (Signed)
error 

## 2019-04-18 DIAGNOSIS — E1122 Type 2 diabetes mellitus with diabetic chronic kidney disease: Secondary | ICD-10-CM | POA: Diagnosis not present

## 2019-04-18 DIAGNOSIS — N183 Chronic kidney disease, stage 3 (moderate): Secondary | ICD-10-CM | POA: Diagnosis not present

## 2019-04-18 DIAGNOSIS — E1159 Type 2 diabetes mellitus with other circulatory complications: Secondary | ICD-10-CM | POA: Diagnosis not present

## 2019-04-18 DIAGNOSIS — Z8719 Personal history of other diseases of the digestive system: Secondary | ICD-10-CM | POA: Diagnosis not present

## 2019-04-18 DIAGNOSIS — R1032 Left lower quadrant pain: Secondary | ICD-10-CM | POA: Diagnosis not present

## 2019-04-18 DIAGNOSIS — Z789 Other specified health status: Secondary | ICD-10-CM | POA: Diagnosis not present

## 2019-04-18 DIAGNOSIS — Z794 Long term (current) use of insulin: Secondary | ICD-10-CM | POA: Diagnosis not present

## 2019-04-18 DIAGNOSIS — R8290 Unspecified abnormal findings in urine: Secondary | ICD-10-CM | POA: Diagnosis not present

## 2019-04-18 DIAGNOSIS — E1142 Type 2 diabetes mellitus with diabetic polyneuropathy: Secondary | ICD-10-CM | POA: Diagnosis not present

## 2019-04-18 DIAGNOSIS — R1031 Right lower quadrant pain: Secondary | ICD-10-CM | POA: Diagnosis not present

## 2019-04-18 DIAGNOSIS — M791 Myalgia, unspecified site: Secondary | ICD-10-CM | POA: Diagnosis not present

## 2019-04-19 ENCOUNTER — Ambulatory Visit: Payer: Medicare Other

## 2019-04-19 ENCOUNTER — Other Ambulatory Visit: Payer: Self-pay

## 2019-04-19 ENCOUNTER — Other Ambulatory Visit: Payer: Self-pay | Admitting: Internal Medicine

## 2019-04-19 NOTE — Chronic Care Management (AMB) (Signed)
  Chronic Care Management   Note  04/19/2019 Name: [name] MRN: [id number] DOB: 1928/10/18  Care Coordination: Successful telephone encounter to 38, daughter and caregiver of [name], for chronic case management initial assessment appointment and to discuss health goals. Per Tammy, she and [name] have had multiple medical appointments this week and they wish to postpone appointment with CCM RN CM for a couple of weeks.   Plan: initial assessment appointment rescheduled for 05/10/2019 at 2:00 as this is convenient for patient and daughter. Daughter has been provided CCM RN CM contact information if needs should arise prior to assessment.  Arron Tetrault E. Rollene Rotunda, RN, BSN Nurse Care Coordinator [facility] Care Management [phone]

## 2019-04-20 DIAGNOSIS — R1032 Left lower quadrant pain: Secondary | ICD-10-CM | POA: Insufficient documentation

## 2019-04-20 DIAGNOSIS — R1031 Right lower quadrant pain: Secondary | ICD-10-CM | POA: Insufficient documentation

## 2019-04-20 DIAGNOSIS — R399 Unspecified symptoms and signs involving the genitourinary system: Secondary | ICD-10-CM | POA: Insufficient documentation

## 2019-04-23 DIAGNOSIS — L578 Other skin changes due to chronic exposure to nonionizing radiation: Secondary | ICD-10-CM | POA: Diagnosis not present

## 2019-04-23 DIAGNOSIS — L821 Other seborrheic keratosis: Secondary | ICD-10-CM | POA: Diagnosis not present

## 2019-04-23 DIAGNOSIS — L820 Inflamed seborrheic keratosis: Secondary | ICD-10-CM | POA: Diagnosis not present

## 2019-04-23 DIAGNOSIS — L814 Other melanin hyperpigmentation: Secondary | ICD-10-CM | POA: Diagnosis not present

## 2019-04-23 DIAGNOSIS — L570 Actinic keratosis: Secondary | ICD-10-CM | POA: Diagnosis not present

## 2019-04-23 DIAGNOSIS — Z1283 Encounter for screening for malignant neoplasm of skin: Secondary | ICD-10-CM | POA: Diagnosis not present

## 2019-04-25 ENCOUNTER — Telehealth (INDEPENDENT_AMBULATORY_CARE_PROVIDER_SITE_OTHER): Payer: Self-pay | Admitting: Vascular Surgery

## 2019-04-25 NOTE — Telephone Encounter (Signed)
Bring her in for a mesenteric duplex with dew or myself

## 2019-04-25 NOTE — Telephone Encounter (Signed)
Please contact patient to schedule as advised below. AS, CMA

## 2019-04-25 NOTE — Telephone Encounter (Signed)
Please advise. AS, CMA 

## 2019-04-26 ENCOUNTER — Other Ambulatory Visit (INDEPENDENT_AMBULATORY_CARE_PROVIDER_SITE_OTHER): Payer: Self-pay | Admitting: Nurse Practitioner

## 2019-04-26 DIAGNOSIS — I700 Atherosclerosis of aorta: Secondary | ICD-10-CM

## 2019-04-26 DIAGNOSIS — R1031 Right lower quadrant pain: Secondary | ICD-10-CM

## 2019-04-30 ENCOUNTER — Ambulatory Visit (INDEPENDENT_AMBULATORY_CARE_PROVIDER_SITE_OTHER): Payer: Medicare Other

## 2019-04-30 ENCOUNTER — Encounter (INDEPENDENT_AMBULATORY_CARE_PROVIDER_SITE_OTHER): Payer: Self-pay | Admitting: Nurse Practitioner

## 2019-04-30 ENCOUNTER — Other Ambulatory Visit: Payer: Self-pay

## 2019-04-30 ENCOUNTER — Ambulatory Visit (INDEPENDENT_AMBULATORY_CARE_PROVIDER_SITE_OTHER): Payer: Medicare Other | Admitting: Nurse Practitioner

## 2019-04-30 VITALS — BP 192/80 | HR 75 | Resp 14 | Ht 62.5 in | Wt 148.0 lb

## 2019-04-30 DIAGNOSIS — I700 Atherosclerosis of aorta: Secondary | ICD-10-CM | POA: Diagnosis not present

## 2019-04-30 DIAGNOSIS — R600 Localized edema: Secondary | ICD-10-CM | POA: Diagnosis not present

## 2019-04-30 DIAGNOSIS — I739 Peripheral vascular disease, unspecified: Secondary | ICD-10-CM | POA: Diagnosis not present

## 2019-04-30 DIAGNOSIS — R1031 Right lower quadrant pain: Secondary | ICD-10-CM

## 2019-04-30 DIAGNOSIS — I771 Stricture of artery: Secondary | ICD-10-CM

## 2019-04-30 DIAGNOSIS — K551 Chronic vascular disorders of intestine: Secondary | ICD-10-CM

## 2019-04-30 DIAGNOSIS — E1121 Type 2 diabetes mellitus with diabetic nephropathy: Secondary | ICD-10-CM

## 2019-04-30 NOTE — Progress Notes (Signed)
SUBJECTIVE:  Patient ID: [name], female    DOB: [birth year], 85 y.o.   MRN: [id number] Chief Complaint  Patient presents with  . Follow-up    HPI  [name] is a 85 y.o. female I am asked to evaluate the patient for the complaint of abdominal pain with uncertain etiology.  The patient is followed by GI who is concerned about mesenteric ischemia.  The pain is mostly in her side and she characterizes it as somewhat constant.  The patient denies weight loss as well as nausea.  The patient does not substantiate food fear, particular foods do not seem to aggravate or alleviate the symptoms.  The patient denies bloody bowel movements or diarrhea.  The patient has a history of colonoscopy which was not diagnostic.  No history of peptic ulcer disease.   No prior peripheral angiograms or vascular interventions.  The patient denies amaurosis fugax or recent TIA symptoms. There are no recent neurological changes noted. The patient denies claudication symptoms or rest pain symptoms. The patient has history of DVT but no, PE or superficial thrombophlebitis. The patient denies recent episodes of angina    Patient has 70 to 99% stenosis in the superior mesenteric artery.  The elevated velocities seen in the SMA are suggestive of ischemia.   Past Medical History:  Diagnosis Date  . Angina pectoris ([place])   . Atrial fibrillation ([place])   . Cervicalgia   . Chronic back pain   . Chronic obstructive pulmonary disease (COPD) ([place])   . COPD (chronic obstructive pulmonary disease) ([place])   . DDD (degenerative disc disease), lumbar   . Diabetes mellitus without complication ([place])   . Dysphagia   . Hyperlipemia   . Hypertension   . Vulvovaginitis     Past Surgical History:  Procedure Laterality Date  . gall bladder    . melanoma     removal neck and back  . PERIPHERAL VASCULAR CATHETERIZATION Left 01/05/2016   Procedure: Lower Extremity Angiography;  Surgeon: Algernon Huxley,  MD;  Location: [facility];  Service: Cardiovascular;  Laterality: Left;  . PERIPHERAL VASCULAR CATHETERIZATION  01/05/2016   Procedure: Lower Extremity Intervention;  Surgeon: Algernon Huxley, MD;  Location: [facility];  Service: Cardiovascular;;  . ureterolithiasis     calculus removed  . VAGINAL HYSTERECTOMY    . VULVA / PERINEUM BIOPSY  05/29/2015    Social History   Socioeconomic History  . Marital status: Widowed    Spouse name: Not on file  . Number of children: 3  . Years of education: Not on file  . Highest education level: 8th grade  Occupational History  . Occupation: retired  Scientific laboratory technician  . Financial resource strain: Not hard at all  . Food insecurity    Worry: Never true    Inability: Never true  . Transportation needs    Medical: No    Non-medical: No  Tobacco Use  . Smoking status: Never Smoker  . Smokeless tobacco: Never Used  Substance and Sexual Activity  . Alcohol use: No  . Drug use: No  . Sexual activity: Never  Lifestyle  . Physical activity    Days per week: 0 days    Minutes per session: 0 min  . Stress: Not at all  Relationships  . Social connections    Talks on phone: Patient refused    Gets together: Patient refused    Attends religious service: Patient refused  Active member of club or organization: Patient refused    Attends meetings of clubs or organizations: Patient refused    Relationship status: Patient refused  . Intimate partner violence    Fear of current or ex partner: Patient refused    Emotionally abused: Patient refused    Physically abused: Patient refused    Forced sexual activity: Patient refused  Other Topics Concern  . Not on file  Social History Narrative  . Not on file    Family History  Problem Relation Age of Onset  . Ovarian cancer Other   . Diabetes Sister   . Colon cancer Brother   . Diabetes Brother   . Atrial fibrillation Daughter   . Breast cancer Neg Hx     Allergies  Allergen  Reactions  . Neomycin-Bacitracin Zn-Polymyx Swelling and Rash  . Latex Rash  . Lidocaine Rash  . Aricept [Donepezil Hcl] Diarrhea  . Benzalkonium Chloride Itching and Swelling  . Ibuprofen     Other reaction(s): Dizziness Heart fluttering. Tachycardia. Tachycardia.  . Valdecoxib Nausea And Vomiting  . Albuterol Rash  . Tape Rash    blisters  . Triamcinolone Rash     Review of Systems   Review of Systems: Negative Unless Checked Constitutional: [] Weight loss  [] Fever  [] Chills Cardiac: [] Chest pain   []  Atrial Fibrillation  [] Palpitations   [] Shortness of breath when laying flat   [] Shortness of breath with exertion. [] Shortness of breath at rest Vascular:  [] Pain in legs with walking   [] Pain in legs with standing [] Pain in legs when laying flat   [] Claudication    [] Pain in feet when laying flat    [x] History of DVT   [] Phlebitis   [x] Swelling in legs   [x] Varicose veins   [] Non-healing ulcers Pulmonary:   [] Uses home oxygen   [] Productive cough   [] Hemoptysis   [] Wheeze  [] COPD   [] Asthma Neurologic:  [x] Dizziness   [] Seizures  [] Blackouts [] History of stroke   [] History of TIA  [] Aphasia   [] Temporary Blindness   [] Weakness or numbness in arm   [x] Weakness or numbness in leg Musculoskeletal:   [] Joint swelling   [] Joint pain   [] Low back pain  []  History of Knee Replacement [] Arthritis [] back Surgeries  []  Spinal Stenosis    Hematologic:  [] Easy bruising  [] Easy bleeding   [] Hypercoagulable state   [] Anemic Gastrointestinal:  [] Diarrhea   [] Vomiting  [] Gastroesophageal reflux/heartburn   [] Difficulty swallowing. [] Abdominal pain Genitourinary:  [] Chronic kidney disease   [] Difficult urination  [] Anuric   [] Blood in urine [] Frequent urination  [] Burning with urination   [] Hematuria Skin:  [] Rashes   [] Ulcers [] Wounds Psychological:  [] History of anxiety   []  History of major depression  [x]  Memory Difficulties      OBJECTIVE:   Physical Exam  BP (!) 192/80 (BP Location: Left  Arm, Patient Position: Sitting, Cuff Size: Normal)   Pulse 75   Resp 14   Ht 5' 2.5" (1.588 m)   Wt 148 lb (67.1 kg)   BMI 26.64 kg/m   Gen: WD/WN, NAD Head: [place]/AT, No temporalis wasting.  Ear/Nose/Throat: Hearing grossly intact, nares w/o erythema or drainage Eyes: PER, EOMI, sclera nonicteric.  Neck: Supple, no masses.  No JVD.  Pulmonary:  Good air movement, no use of accessory muscles.  Cardiac: RRR Vascular:  Vessel Right Left  Radial Palpable Palpable   Gastrointestinal: soft, non-distended. No guarding/no peritoneal signs.  Musculoskeletal: Wheelchair-bound. No deformity or atrophy.  Neurologic: Pain and light touch  intact in extremities.  Symmetrical.  Speech is fluent. Motor exam as listed above. Psychiatric: Judgment intact, Mood & affect appropriate for pt's clinical situation. Dermatologic: No Venous rashes. No Ulcers Noted.  No changes consistent with cellulitis. Lymph : No Cervical lymphadenopathy, no lichenification or skin changes of chronic lymphedema.       ASSESSMENT AND PLAN:  1. Superior mesenteric artery stenosis [facility]) Patient has 70 to 99% stenosis in the superior mesenteric artery.  The elevated velocities seen in the SMA are suggestive of ischemia.  Recommend:  The patient has evidence of severe atherosclerotic changes of the mesenteric arteries associated with weight loss as well as abdominal pain and N/V.  This represents a high risk for bowel infarction and death.  Patient should undergo angiography of the mesenteric arteries with the hope for intervention to eliminate the ischemic symptoms.    The risks and benefits as well as the alternative therapies was discussed in detail with the patient.  All questions were answered.  Patient agrees to proceed with angiography and intervention.  The patient will follow up with me after the angiogram.  2. PVD (peripheral vascular disease) ([place]) The patient has a history of peripheral artery disease however  we have not checked her lower extremity studies in approximately 18 months.  The patient has a previous history of stents in her legs.  She is starting to endorse some claudication with ambulation.  We will check an ABI with the patient returns for follow-up after the mesenteric angiogram.  3. Localized edema No surgery or intervention at this point in time.  I have reviewed my discussion with the patient regarding venous insufficiency and why it causes symptoms. I have discussed with the patient the chronic skin changes that accompany venous insufficiency and the long term sequela such as ulceration. Patient will contnue wearing graduated compression stockings on a daily basis, as this has provided excellent control of his edema. The patient will put the stockings on first thing in the morning and removing them in the evening. The patient is reminded not to sleep in the stockings.  In addition, behavioral modification including elevation during the day will be initiated. Exercise is strongly encouraged.     4. Type 2 diabetes mellitus with diabetic nephropathy, without long-term current use of insulin (HCC) Continue hypoglycemic medications as already ordered, these medications have been reviewed and there are no changes at this time.  Hgb A1C to be monitored as already arranged by primary service    Current Outpatient Medications on File Prior to Visit  Medication Sig Dispense Refill  . albuterol (VENTOLIN HFA) 108 (90 Base) MCG/ACT inhaler INHALE 2 PUFFS INTO THE LUNGS EVERY 4 HOURS AS NEEDED FOR WHEEZING ORSHORTNESS OF BREATH 8.5 g 0  . Ascorbic Acid (VITAMIN C) 1000 MG tablet Take 1,000 mg by mouth daily.     Marland Kitchen aspirin 325 MG tablet Take 325 mg by mouth daily. Reported on 01/05/2016    . ASSURE COMFORT LANCETS 30G MISC     . Blood Glucose Calibration (ACCU-CHEK SMARTVIEW CONTROL) LIQD     . cetirizine (ZYRTEC) 10 MG tablet TAKE ONE TABLET BY MOUTH EVERY DAY 90 tablet 3  .  cholecalciferol (VITAMIN D3) 25 MCG (1000 UT) tablet Take 1,000 Units by mouth daily.    . clobetasol ointment (TEMOVATE) 7.49 % Apply 1 application topically daily. To face    . diltiazem (TIAZAC) 360 MG 24 hr capsule TAKE 1 CAPSULE BY MOUTH EVERY DAY 90 capsule 3  .  ezetimibe (ZETIA) 10 MG tablet Take 1 tablet (10 mg total) by mouth daily. 30 tablet 12  . FLUoxetine (PROZAC) 40 MG capsule Take 1 capsule (40 mg total) by mouth daily. 30 capsule 12  . fluticasone-salmeterol (ADVAIR HFA) 230-21 MCG/ACT inhaler Inhale 2 puffs into the lungs 2 (two) times daily. 1 Inhaler 12  . glucose blood test strip ACCU-CHEK AVIVA PLUS TEST STRP Use to check sugars 3 times daily.    . hydrocortisone (ANUSOL-HC) 25 MG suppository Place 1 suppository (25 mg total) rectally at bedtime as needed for hemorrhoids. 12 suppository 0  . insulin glargine (LANTUS) 100 UNIT/ML injection Inject 0.05 mLs (5 Units total) into the skin at bedtime. (Patient taking differently: Inject 10 Units into the skin at bedtime. ) 10 mL 11  . insulin lispro (HUMALOG KWIKPEN) 100 UNIT/ML KiwkPen Inject 10 Units into the skin 4 (four) times daily.     . lansoprazole (PREVACID) 30 MG capsule TAKE 1 CAPSULE BY MOUTH EVERY DAY AT NOON 90 capsule 3  . losartan (COZAAR) 100 MG tablet TAKE ONE TABLET BY MOUTH EVERY DAY 90 tablet 3  . Magnesium 400 MG CAPS Take by mouth daily.    . memantine (NAMENDA) 5 MG tablet TAKE ONE TABLET TWICE DAILY 60 tablet 12  . Multiple Vitamins-Minerals (ICAPS AREDS 2 PO) Take by mouth 2 (two) times daily.     . nitrofurantoin (MACRODANTIN) 100 MG capsule Take 1 capsule (100 mg total) by mouth 2 (two) times daily. 6 capsule 0  . potassium chloride (K-DUR,KLOR-CON) 10 MEQ tablet TAKE ONE TABLET BY MOUTH TWICE DAILY 180 tablet 3  . sitaGLIPtin (JANUVIA) 50 MG tablet Take 1 tablet (50 mg total) by mouth daily. 30 tablet 12  . SURE COMFORT PEN NEEDLES 31G X 5 MM MISC     . traZODone (DESYREL) 50 MG tablet TAKE 1 AND 1/2  TABLET AT BEDTIME AS NEEDED FOR SLEEP 45 tablet 11  . vitamin E 1000 UNIT capsule Take 1,000 Units by mouth daily.     No current facility-administered medications on file prior to visit.     There are no Patient Instructions on file for this visit. No follow-ups on file.   Kris Hartmann, NP  This note was completed with Sales executive.  Any errors are purely unintentional.

## 2019-05-01 ENCOUNTER — Telehealth (INDEPENDENT_AMBULATORY_CARE_PROVIDER_SITE_OTHER): Payer: Self-pay

## 2019-05-01 NOTE — Telephone Encounter (Signed)
Spoke with the patients daughter [name] and the patient is now scheduled for angio with Dr. Lucky Cowboy on 05/10/2019 with a 7:30 arrival time to the [facility]. Patient will do the Covid testing on 05/07/2019 between 12:30-2:30 pm at the [facility]. Pre-procedure instructions were discussed and they will be mailed out as well.

## 2019-05-07 ENCOUNTER — Other Ambulatory Visit: Payer: Self-pay

## 2019-05-07 ENCOUNTER — Other Ambulatory Visit
Admission: RE | Admit: 2019-05-07 | Discharge: 2019-05-07 | Disposition: A | Discharge status: Home / Self Care | Payer: Medicare Other | Source: Ambulatory Visit | Attending: Vascular Surgery | Admitting: Vascular Surgery

## 2019-05-07 DIAGNOSIS — Z20828 Contact with and (suspected) exposure to other viral communicable diseases: Secondary | ICD-10-CM | POA: Diagnosis not present

## 2019-05-07 DIAGNOSIS — Z01812 Encounter for preprocedural laboratory examination: Secondary | ICD-10-CM | POA: Insufficient documentation

## 2019-05-08 ENCOUNTER — Ambulatory Visit: Payer: Self-pay

## 2019-05-08 ENCOUNTER — Other Ambulatory Visit (INDEPENDENT_AMBULATORY_CARE_PROVIDER_SITE_OTHER): Payer: Self-pay | Admitting: Nurse Practitioner

## 2019-05-08 LAB — SARS CORONAVIRUS 2 (TAT 6-24 HRS): SARS Coronavirus 2: NEGATIVE

## 2019-05-08 NOTE — Chronic Care Management (AMB) (Signed)
  Chronic Care Management   Note  05/08/2019 Name: [name] MRN: [id number] DOB: 1929/02/05  Care Coordination: Incoming call from patient's daughter Conrad Westfir, requesting to change patients initial assessment appointment, 05/10/2019 with RN CM, to September. Daughter states patient is scheduled for an angiogram on the same day. Ms. Jhonnie Garner is apologetic for requesting appointment to be rescheduled as this is the second request. Initial assessment was scheduled for 04/19/2019.  Plan: Per daughter request, initial assessment appointment rescheduled for 06/08/2019 at 2:00  Mihaela Fajardo E. Rollene Rotunda, RN, BSN Nurse Care Coordinator [facility] Care Management [phone]

## 2019-05-10 ENCOUNTER — Ambulatory Visit
Admission: RE | Admit: 2019-05-10 | Discharge: 2019-05-10 | Disposition: A | Discharge status: Home / Self Care | Payer: Medicare Other | Attending: Vascular Surgery | Admitting: Vascular Surgery

## 2019-05-10 ENCOUNTER — Encounter
Admission: RE | Disposition: A | Discharge status: Home / Self Care | Payer: Self-pay | Source: Home / Self Care | Attending: Vascular Surgery

## 2019-05-10 ENCOUNTER — Other Ambulatory Visit: Payer: Self-pay

## 2019-05-10 ENCOUNTER — Telehealth: Payer: Self-pay

## 2019-05-10 DIAGNOSIS — R600 Localized edema: Secondary | ICD-10-CM | POA: Diagnosis not present

## 2019-05-10 DIAGNOSIS — Z79899 Other long term (current) drug therapy: Secondary | ICD-10-CM | POA: Insufficient documentation

## 2019-05-10 DIAGNOSIS — Z833 Family history of diabetes mellitus: Secondary | ICD-10-CM | POA: Diagnosis not present

## 2019-05-10 DIAGNOSIS — Z91040 Latex allergy status: Secondary | ICD-10-CM | POA: Insufficient documentation

## 2019-05-10 DIAGNOSIS — Z90710 Acquired absence of both cervix and uterus: Secondary | ICD-10-CM | POA: Insufficient documentation

## 2019-05-10 DIAGNOSIS — Z7982 Long term (current) use of aspirin: Secondary | ICD-10-CM | POA: Diagnosis not present

## 2019-05-10 DIAGNOSIS — J449 Chronic obstructive pulmonary disease, unspecified: Secondary | ICD-10-CM | POA: Diagnosis not present

## 2019-05-10 DIAGNOSIS — I739 Peripheral vascular disease, unspecified: Secondary | ICD-10-CM | POA: Diagnosis not present

## 2019-05-10 DIAGNOSIS — I771 Stricture of artery: Secondary | ICD-10-CM | POA: Insufficient documentation

## 2019-05-10 DIAGNOSIS — I4891 Unspecified atrial fibrillation: Secondary | ICD-10-CM | POA: Insufficient documentation

## 2019-05-10 DIAGNOSIS — K551 Chronic vascular disorders of intestine: Secondary | ICD-10-CM | POA: Diagnosis not present

## 2019-05-10 DIAGNOSIS — E785 Hyperlipidemia, unspecified: Secondary | ICD-10-CM | POA: Diagnosis not present

## 2019-05-10 DIAGNOSIS — Z794 Long term (current) use of insulin: Secondary | ICD-10-CM | POA: Insufficient documentation

## 2019-05-10 DIAGNOSIS — E1121 Type 2 diabetes mellitus with diabetic nephropathy: Secondary | ICD-10-CM | POA: Diagnosis not present

## 2019-05-10 DIAGNOSIS — Z8249 Family history of ischemic heart disease and other diseases of the circulatory system: Secondary | ICD-10-CM | POA: Diagnosis not present

## 2019-05-10 DIAGNOSIS — Z888 Allergy status to other drugs, medicaments and biological substances status: Secondary | ICD-10-CM | POA: Insufficient documentation

## 2019-05-10 DIAGNOSIS — Z881 Allergy status to other antibiotic agents status: Secondary | ICD-10-CM | POA: Insufficient documentation

## 2019-05-10 HISTORY — PX: VISCERAL ANGIOGRAPHY: CATH118276

## 2019-05-10 LAB — GLUCOSE, CAPILLARY
Glucose-Capillary: 127 mg/dL — ABNORMAL HIGH (ref 70–99)
Glucose-Capillary: 143 mg/dL — ABNORMAL HIGH (ref 70–99)

## 2019-05-10 LAB — CREATININE, SERUM
Creatinine, Ser: 1.13 mg/dL — ABNORMAL HIGH (ref 0.44–1.00)
GFR calc Af Amer: 50 mL/min — ABNORMAL LOW (ref >60)
GFR calc non Af Amer: 43 mL/min — ABNORMAL LOW (ref >60)

## 2019-05-10 LAB — BUN: BUN: 15 mg/dL (ref 8–23)

## 2019-05-10 SURGERY — VISCERAL ANGIOGRAPHY
Anesthesia: Moderate Sedation

## 2019-05-10 MED ORDER — MIDAZOLAM HCL 2 MG/ML PO SYRP: 8.0000 mg | ORAL_SOLUTION | Freq: Once | ORAL | Status: DC | PRN

## 2019-05-10 MED ORDER — DIPHENHYDRAMINE HCL 50 MG/ML IJ SOLN: 50.0000 mg | Freq: Once | INTRAMUSCULAR | Status: DC | PRN

## 2019-05-10 MED ORDER — HEPARIN SODIUM (PORCINE) 1000 UNIT/ML IJ SOLN
INTRAMUSCULAR | Status: DC | PRN
  Administered 2019-05-10: 5000 [IU] via INTRAVENOUS

## 2019-05-10 MED ORDER — FENTANYL CITRATE (PF) 100 MCG/2ML IJ SOLN
INTRAMUSCULAR | Status: DC | PRN
  Administered 2019-05-10: 50 ug via INTRAVENOUS

## 2019-05-10 MED ORDER — FAMOTIDINE 20 MG PO TABS: 40.0000 mg | ORAL_TABLET | Freq: Once | ORAL | Status: DC | PRN

## 2019-05-10 MED ORDER — MIDAZOLAM HCL 5 MG/5ML IJ SOLN
INTRAMUSCULAR | Status: AC
  Filled 2019-05-10: qty 5

## 2019-05-10 MED ORDER — SODIUM CHLORIDE 0.9 % IV SOLN
INTRAVENOUS | Status: DC
  Administered 2019-05-10: 08:00:00 via INTRAVENOUS

## 2019-05-10 MED ORDER — MIDAZOLAM HCL 2 MG/2ML IJ SOLN
INTRAMUSCULAR | Status: DC | PRN
  Administered 2019-05-10: 1 mg via INTRAVENOUS

## 2019-05-10 MED ORDER — CLOPIDOGREL BISULFATE 75 MG PO TABS: 75.0000 mg | ORAL_TABLET | Freq: Every day | ORAL | 11 refills | Status: DC

## 2019-05-10 MED ORDER — HYDROMORPHONE HCL 1 MG/ML IJ SOLN: 1.0000 mg | Freq: Once | INTRAMUSCULAR | Status: DC | PRN

## 2019-05-10 MED ORDER — FENTANYL CITRATE (PF) 100 MCG/2ML IJ SOLN
INTRAMUSCULAR | Status: AC
  Filled 2019-05-10: qty 2

## 2019-05-10 MED ORDER — ONDANSETRON HCL 4 MG/2ML IJ SOLN: 4.0000 mg | Freq: Four times a day (QID) | INTRAMUSCULAR | Status: DC | PRN

## 2019-05-10 MED ORDER — METHYLPREDNISOLONE SODIUM SUCC 125 MG IJ SOLR: 125.0000 mg | Freq: Once | INTRAMUSCULAR | Status: DC | PRN

## 2019-05-10 MED ORDER — IODIXANOL 320 MG/ML IV SOLN
INTRAVENOUS | Status: DC | PRN
  Administered 2019-05-10: 110 mL via INTRA_ARTERIAL

## 2019-05-10 MED ORDER — CEFAZOLIN SODIUM-DEXTROSE 2-4 GM/100ML-% IV SOLN
2.0000 g | Freq: Once | INTRAVENOUS | Status: AC
  Administered 2019-05-10: 2 g via INTRAVENOUS
  Filled 2019-05-10: qty 100

## 2019-05-10 MED ORDER — ATORVASTATIN CALCIUM 10 MG PO TABS: 10.0000 mg | ORAL_TABLET | Freq: Every day | ORAL | 11 refills | Status: DC

## 2019-05-10 MED ORDER — HEPARIN SODIUM (PORCINE) 1000 UNIT/ML IJ SOLN
INTRAMUSCULAR | Status: AC
  Filled 2019-05-10: qty 1

## 2019-05-10 SURGICAL SUPPLY — 23 items
BALLN MUSTANG 8X20X75 (BALLOONS) ×3
BALLN MUSTANG 9X40X75 (BALLOONS) ×3
BALLN ULTRVRSE 9X40X75C (BALLOONS) ×3
BALLN ULTRVRSE PTA 5X40X75C (BALLOONS) ×3
BALLOON MUSTANG 8X20X75 (BALLOONS) ×1 IMPLANT
BALLOON MUSTANG 9X40X75 (BALLOONS) ×1 IMPLANT
BALLOON ULTRVRSE 9X40X75C (BALLOONS) ×1 IMPLANT
BALLOON ULTRVRSE PTA 5X40X75C (BALLOONS) ×1 IMPLANT
CATH BEACON 5 .035 100 C2 TIP (CATHETERS) ×3 IMPLANT
CATH PIG 70CM (CATHETERS) ×3 IMPLANT
CATH VS15FR (CATHETERS) ×6 IMPLANT
COVER PROBE U/S 5X48 (MISCELLANEOUS) ×3 IMPLANT
DEVICE PRESTO INFLATION (MISCELLANEOUS) ×3 IMPLANT
DEVICE STARCLOSE SE CLOSURE (Vascular Products) ×3 IMPLANT
GLIDEWIRE STIFF .35X180X3 HYDR (WIRE) ×3 IMPLANT
PACK ANGIOGRAPHY (CUSTOM PROCEDURE TRAY) ×3 IMPLANT
SHEATH ANL2 6FRX45 HC (SHEATH) ×3 IMPLANT
SHEATH BRITE TIP 5FRX11 (SHEATH) ×3 IMPLANT
STENT LIFESTREAM 7X16X80 (Permanent Stent) ×3 IMPLANT
SYR MEDRAD MARK V 150ML (SYRINGE) ×3 IMPLANT
TUBING CONTRAST HIGH PRESS 72 (TUBING) ×3 IMPLANT
WIRE J 3MM .035X145CM (WIRE) ×6 IMPLANT
WIRE MAGIC TOR.035 180C (WIRE) ×6 IMPLANT

## 2019-05-10 NOTE — H&P (Signed)
[facility] History & Physical Update  The patient was interviewed and re-examined.  The patient's previous History and Physical has been reviewed and is unchanged.  There is no change in the plan of care. We plan to proceed with the scheduled procedure.  Leotis Pain, MD  05/10/2019, 8:30 AM

## 2019-05-10 NOTE — Op Note (Signed)
[facility] Percutaneous Study/Intervention Procedural Note   Date: 05/10/2019  Surgeon(s): Leotis Pain, MD  Assistants: none  Pre-operative Diagnosis: 1.  Chronic Mesenteric ischemia 2. Celiac and SMA stenosis   Post-operative diagnosis: Same  Procedure(s) Performed: 1. Ultrasound guidance for vascular access right femoral artery  2. Catheter placement into celiac artery and superior mesenteric artery from right femoral approach 3. Aortogram and selective angiogram of the celiac and superior mesenteric artery 4. Balloon angioplasty and stent of the celiac artery with 7 mm diameter by 16 mm length lifestream stent with mild deployment of the stent requiring it to be seated and deployed in the right external iliac artery and seated with a 9 mm diameter balloon in the right external iliac artery 5. StarClose closure device right femoral artery  Contrast: 110  Fluoro time: 14.9  EBL: 10 cc  Anesthesia: Approximately 60 minutes of Moderate conscious sedation using 1 mg of Versed and 50 Mcg of Fentanyl  Indications: Patient is a 85 y.o. female who has symptoms consistent with mesenteric ischemia. The patient has a duplex showing elevated velocities in the visceral vessels. The patient is brought in for angiography for further evaluation and potential treatment. Risks and benefits are discussed and informed consent is obtained  Procedure: The patient was identified and appropriate procedural time out was performed. The patient was then placed supine on the table and prepped and draped in the usual sterile fashion.Moderate conscious sedation was administered during a face to face encounter with the patient throughout the procedure with my supervision of the RN administering medicines and monitoring the patient's vital signs, pulse oximetry, telemetry and mental status throughout from the  start of the procedure until the patient was taken to the recovery room. Ultrasound was used to evaluate the right common femoral artery. It was patent . A digital ultrasound image was acquired. A Seldinger needle was used to access the right common femoral artery under direct ultrasound guidance and a permanent image was performed. A 0.035 J wire was advanced without resistance and a 5Fr sheath was placed. Pigtail catheter was placed into the aorta and an AP aortogram was performed. This demonstrated reasonably normal renal arteries with what appear to be a small accessory right renal artery.  The aorta and iliac arteries had mild disease but nothing hemodynamically significant. We transitioned to the lateral projection to image the celiac and SMA. The lateral image demonstrated what appeared to be significant stenosis of the celiac artery and mild stenosis of the SMA and we elected to perform selective imaging of both vessels for further evaluation. The patient was given 5000 units of IV heparin.We upsized to a 6 Fr sheath.  A V S1 catheter was used to selectively cannulate the SMA. This demonstrated a mild stenosis in the 20 to 25% range stenosis of the SMA.  I then advanced the catheter superiorly and selectively cannulated the celiac artery.  The V S1 catheter actually would pop past the stenosis because of the very short lesion and we exchanged for a C2 catheter with better imaging of the celiac confirming the 70 to 75% stenosis. Based on her symptoms and these findings, I elected to treat the celiac artery to try to improve the patient's clinical course. I crossed the lesion without difficulty with a Magic torque wire and the C2 catheter.  We then remove the catheter and placed an Ansell sheath and imaging was actually better through the sheath. I then used a 7 mm diameter x  16 mm length balloon expandable stent to perform treatment of the celiac artery. I inflated the balloon to 12 Atm and the  proximal celiac artery with about 1 to 2 mm of overlap into the aorta.  The balloon was deflated and the sheath was pulled back. On completion angiogram following this, 30-35 % residual stenosis was identified in the celiac artery, but the stent was found to have migrated back into the aorta.  At this point, we still had wire access to the celiac artery through the stent.  It was clear we would not be able to push the stent back into the celiac artery and the celiac artery had improved with the angioplasty even with the stent removed.  The stent however needed to be deployed somewhere safely and the most logical location was the right iliac system.  Being very careful to keep wire access through the stent we gently tried to pull the stent down with a small balloon and it did come down into the aortic bifurcation region.  We were originally going to deploy the stent in the right common iliac artery with an 8 mm diameter balloon but with gentle inflation the stent continued to migrate distally down into the right external iliac artery.  Despite her advanced age and small size, her iliac arteries were fairly generous.  In the mid right external iliac artery the stent appeared to seat somewhat and we then used a 9 mm diameter balloon to ensure stent wall apposition in the mid right external iliac artery.  Completion imaging showed the stent to be widely patent in the right external iliac artery with good wall apposition.  There was no significant residual stenosis in the common iliac artery or distal aorta where the stent was pulled down. At this point, I elected to terminate the procedure. The diagnostic catheter was removed. StarClose closure device was deployed in usual fashion with excellent hemostatic result. The patient was taken to the recovery room in stable condition having tolerated the procedure well.     Findings:Celiac artery with 70 to 75% stenosis, SMA with a 20 to 25% stenosis.  Disposition:  Patient was taken to the recovery room in stable condition having tolerated the procedure well.  Complications: stent maldeployed from the celiac artery and then brought down and seated safely in the iliac artery  Leotis Pain 05/10/2019 10:45 AM   This note was created with Dragon Medical transcription system. Any errors in dictation are purely unintentional.

## 2019-05-10 NOTE — Discharge Instructions (Signed)

## 2019-05-15 ENCOUNTER — Encounter: Payer: Self-pay | Admitting: Vascular Surgery

## 2019-06-05 ENCOUNTER — Other Ambulatory Visit: Payer: Self-pay | Admitting: Family Medicine

## 2019-06-05 DIAGNOSIS — J449 Chronic obstructive pulmonary disease, unspecified: Secondary | ICD-10-CM

## 2019-06-05 DIAGNOSIS — J3089 Other allergic rhinitis: Secondary | ICD-10-CM

## 2019-06-08 ENCOUNTER — Telehealth: Payer: Self-pay

## 2019-06-12 ENCOUNTER — Encounter (INDEPENDENT_AMBULATORY_CARE_PROVIDER_SITE_OTHER): Payer: Self-pay | Admitting: Vascular Surgery

## 2019-06-12 ENCOUNTER — Other Ambulatory Visit (INDEPENDENT_AMBULATORY_CARE_PROVIDER_SITE_OTHER): Payer: Self-pay | Admitting: Vascular Surgery

## 2019-06-12 ENCOUNTER — Ambulatory Visit (INDEPENDENT_AMBULATORY_CARE_PROVIDER_SITE_OTHER): Payer: Medicare Other

## 2019-06-12 ENCOUNTER — Encounter (INDEPENDENT_AMBULATORY_CARE_PROVIDER_SITE_OTHER): Payer: Self-pay

## 2019-06-12 ENCOUNTER — Other Ambulatory Visit: Payer: Self-pay

## 2019-06-12 ENCOUNTER — Ambulatory Visit (INDEPENDENT_AMBULATORY_CARE_PROVIDER_SITE_OTHER): Payer: Medicare Other | Admitting: Vascular Surgery

## 2019-06-12 VITALS — BP 181/75 | HR 80 | Resp 12 | Ht 62.0 in | Wt 148.0 lb

## 2019-06-12 DIAGNOSIS — Z9862 Peripheral vascular angioplasty status: Secondary | ICD-10-CM

## 2019-06-12 DIAGNOSIS — Z95828 Presence of other vascular implants and grafts: Secondary | ICD-10-CM | POA: Diagnosis not present

## 2019-06-12 DIAGNOSIS — I774 Celiac artery compression syndrome: Secondary | ICD-10-CM | POA: Diagnosis not present

## 2019-06-12 DIAGNOSIS — I10 Essential (primary) hypertension: Secondary | ICD-10-CM | POA: Diagnosis not present

## 2019-06-12 DIAGNOSIS — I6523 Occlusion and stenosis of bilateral carotid arteries: Secondary | ICD-10-CM | POA: Diagnosis not present

## 2019-06-12 DIAGNOSIS — I739 Peripheral vascular disease, unspecified: Secondary | ICD-10-CM | POA: Diagnosis not present

## 2019-06-12 DIAGNOSIS — I771 Stricture of artery: Secondary | ICD-10-CM | POA: Insufficient documentation

## 2019-06-12 DIAGNOSIS — K551 Chronic vascular disorders of intestine: Secondary | ICD-10-CM | POA: Diagnosis not present

## 2019-06-12 DIAGNOSIS — E1121 Type 2 diabetes mellitus with diabetic nephropathy: Secondary | ICD-10-CM | POA: Diagnosis not present

## 2019-06-12 NOTE — Progress Notes (Signed)
MRN : [id number]  [name] is a 85 y.o. (10-Oct-1928) female who presents with chief complaint of  Chief Complaint  Patient presents with  . Follow-up  .  History of Present Illness: Patient returns today in follow up of multiple vascular issues.  About a month ago, she underwent a celiac artery intervention for symptoms of chronic visceral ischemia.  Almost immediately on performing the procedure, she began having less abdominal pain and better abilities to eat.  She has had marked improvement in her symptoms and is very pleased with this.  Her access site is well-healed.  Her duplex today showed normal velocities in her celiac artery.  Her SMA velocities remain elevated, but on angiogram that did not correlate with a significant stenosis. She is also status post previous lower extremity interventions.  Currently she is walking more and her legs are actually doing pretty well. ABIs today are in the normal range at 1.0 on the right and 0.98 on the left.  Triphasic waveforms distally.  Current Outpatient Medications  Medication Sig Dispense Refill  . albuterol (VENTOLIN HFA) 108 (90 Base) MCG/ACT inhaler INHALE 2 PUFFS INTO THE LUNGS EVERY 4 HOURS AS NEEDED FOR WHEEZING ORSHORTNESS OF BREATH 8.5 g 0  . Ascorbic Acid (VITAMIN C) 1000 MG tablet Take 1,000 mg by mouth daily.     Marland Kitchen aspirin 325 MG tablet Take 325 mg by mouth daily. Reported on 01/05/2016    . atorvastatin (LIPITOR) 10 MG tablet Take 1 tablet (10 mg total) by mouth daily. 30 tablet 11  . benzonatate (TESSALON) 100 MG capsule TAKE 1 CAPSULE BY MOUTH 3 TIMES DAILY ASNEEDED FOR COUGH 90 capsule 0  . cetirizine (ZYRTEC) 10 MG tablet TAKE ONE TABLET BY MOUTH EVERY DAY 90 tablet 3  . cholecalciferol (VITAMIN D3) 25 MCG (1000 UT) tablet Take 1,000 Units by mouth daily.    . clopidogrel (PLAVIX) 75 MG tablet Take 1 tablet (75 mg total) by mouth daily. 30 tablet 11  . diltiazem (TIAZAC) 360 MG 24 hr capsule TAKE 1 CAPSULE BY MOUTH  EVERY DAY 90 capsule 3  . ezetimibe (ZETIA) 10 MG tablet Take 1 tablet (10 mg total) by mouth daily. 30 tablet 12  . FLUoxetine (PROZAC) 40 MG capsule Take 1 capsule (40 mg total) by mouth daily. 30 capsule 12  . insulin glargine (LANTUS) 100 UNIT/ML injection Inject 0.05 mLs (5 Units total) into the skin at bedtime. (Patient taking differently: Inject 10 Units into the skin at bedtime. ) 10 mL 11  . insulin lispro (HUMALOG KWIKPEN) 100 UNIT/ML KiwkPen Inject 10 Units into the skin 4 (four) times daily.     . lansoprazole (PREVACID) 30 MG capsule TAKE 1 CAPSULE BY MOUTH EVERY DAY AT NOON 90 capsule 3  . losartan (COZAAR) 100 MG tablet TAKE ONE TABLET BY MOUTH EVERY DAY 90 tablet 3  . Magnesium 400 MG CAPS Take by mouth daily.    . memantine (NAMENDA) 5 MG tablet TAKE ONE TABLET TWICE DAILY 60 tablet 12  . montelukast (SINGULAIR) 10 MG tablet TAKE 1 TABLET BY MOUTH AT BEDTIME 90 tablet 3  . potassium chloride (K-DUR,KLOR-CON) 10 MEQ tablet TAKE ONE TABLET BY MOUTH TWICE DAILY 180 tablet 3  . traZODone (DESYREL) 50 MG tablet TAKE 1 AND 1/2 TABLET AT BEDTIME AS NEEDED FOR SLEEP 45 tablet 11  . vitamin E 1000 UNIT capsule Take 1,000 Units by mouth daily.    Marland Kitchen glucose blood test strip ACCU-CHEK AVIVA PLUS  TEST STRP Use to check sugars 3 times daily.     No current facility-administered medications for this visit.     Past Medical History:  Diagnosis Date  . Angina pectoris ([place])   . Atrial fibrillation ([place])   . Cervicalgia   . Chronic back pain   . Chronic obstructive pulmonary disease (COPD) ([place])   . COPD (chronic obstructive pulmonary disease) ([place])   . DDD (degenerative disc disease), lumbar   . Diabetes mellitus without complication ([place])   . Dysphagia   . Hyperlipemia   . Hypertension   . Vulvovaginitis     Past Surgical History:  Procedure Laterality Date  . gall bladder    . melanoma     removal neck and back  . PERIPHERAL VASCULAR CATHETERIZATION Left 01/05/2016    Procedure: Lower Extremity Angiography;  Surgeon: Algernon Huxley, MD;  Location: [facility];  Service: Cardiovascular;  Laterality: Left;  . PERIPHERAL VASCULAR CATHETERIZATION  01/05/2016   Procedure: Lower Extremity Intervention;  Surgeon: Algernon Huxley, MD;  Location: [facility];  Service: Cardiovascular;;  . ureterolithiasis     calculus removed  . VAGINAL HYSTERECTOMY    . VISCERAL ANGIOGRAPHY N/A 05/10/2019   Procedure: VISCERAL ANGIOGRAPHY;  Surgeon: Algernon Huxley, MD;  Location: [facility];  Service: Cardiovascular;  Laterality: N/A;  . VULVA / PERINEUM BIOPSY  05/29/2015    Social History Social History   Tobacco Use  . Smoking status: Never Smoker  . Smokeless tobacco: Never Used  Substance Use Topics  . Alcohol use: No  . Drug use: No    Family History Family History  Problem Relation Age of Onset  . Ovarian cancer Other   . Diabetes Sister   . Colon cancer Brother   . Diabetes Brother   . Atrial fibrillation Daughter   . Breast cancer Neg Hx     Allergies  Allergen Reactions  . Neomycin-Bacitracin Zn-Polymyx Swelling and Rash  . Latex Rash  . Lidocaine Rash  . Aricept [Donepezil Hcl] Diarrhea  . Benzalkonium Chloride Itching and Swelling  . Ibuprofen     Other reaction(s): Dizziness Heart fluttering. Tachycardia. Tachycardia.  . Valdecoxib Nausea And Vomiting  . Albuterol Rash  . Tape Rash    blisters  . Triamcinolone Rash    REVIEW OF SYSTEMS(Negative unless checked)  Constitutional: [] ?Weight loss[] ?Fever[] ?Chills Cardiac:[] ?Chest pain[] ?Chest pressure[] ?Palpitations [] ?Shortness of breath when laying flat [] ?Shortness of breath at rest [] ?Shortness of breath with exertion. Vascular: [x] ?Pain in legs with walking[x] ?Pain in legsat rest[] ?Pain in legs when laying flat [] ?Claudication [] ?Pain in feet when walking [] ?Pain in feet at rest [] ?Pain in feet when laying flat [] ?History of DVT  [] ?Phlebitis [] ?Swelling in legs [] ?Varicose veins [] ?Non-healing ulcers Pulmonary: [] ?Uses home oxygen [] ?Productive cough[] ?Hemoptysis [] ?Wheeze [] ?COPD [] ?Asthma Neurologic: [x] ?Dizziness [] ?Blackouts [] ?Seizures [] ?History of stroke [] ?History of TIA[] ?Aphasia [] ?Temporary blindness[] ?Dysphagia [] ?Weaknessor numbness in arms [] ?Weakness or numbnessin legs Musculoskeletal: [x] ?Arthritis [] ?Joint swelling [x] ?Joint pain [] ?Low back pain Hematologic:[x] ?Easy bruising[] ?Easy bleeding [] ?Hypercoagulable state [] ?Anemic  Gastrointestinal:[] ?Blood in stool[] ?Vomiting blood[] ?Gastroesophageal reflux/heartburn[] ?Abdominal pain Genitourinary: [] ?Chronic kidney disease [] ?Difficulturination [] ?Frequenturination [] ?Burning with urination[] ?Hematuria Skin: [] ?Rashes [] ?Ulcers [] ?Wounds Psychological: [] ?History of anxiety[] ?History of major depression.  Physical Examination  BP (!) 181/75 (BP Location: Left Arm, Patient Position: Sitting, Cuff Size: Normal)   Pulse 80   Resp 12   Ht 5\' 2"  (1.575 m)   Wt 148 lb (67.1 kg)   BMI 27.07 kg/m  Gen:  WD/WN, NAD.  Appears younger than stated age Head: [place]/AT, No  temporalis wasting. Ear/Nose/Throat: Hearing grossly intact, nares w/o erythema or drainage Eyes: Conjunctiva clear. Sclera non-icteric Neck: Supple.  Trachea midline Pulmonary:  Good air movement, no use of accessory muscles.  Cardiac: RRR, no JVD Vascular:  Vessel Right Left  Radial Palpable Palpable                          PT Palpable Palpable  DP Palpable Palpable   Gastrointestinal: soft, non-tender/non-distended. No guarding/reflex.  Musculoskeletal: M/S 5/5 throughout.  No deformity or atrophy.  Trace lower extremity edema. Neurologic: Sensation grossly intact in extremities.  Symmetrical.  Speech is fluent.  Psychiatric: Judgment intact, Mood & affect appropriate for pt's clinical situation.  Dermatologic: No rashes or ulcers noted.  No cellulitis or open wounds.       Labs Recent Results (from the past 2160 hour(s))  CBC with Differential/Platelet     Status: Abnormal   Collection Time: 04/05/19  3:39 PM  Result Value Ref Range   WBC 10.2 3.4 - 10.8 x10E3/uL   RBC 4.63 3.77 - 5.28 x10E6/uL   Hemoglobin 13.3 11.1 - 15.9 g/dL   Hematocrit 40.8 34.0 - 46.6 %   MCV 88 79 - 97 fL   MCH 28.7 26.6 - 33.0 pg   MCHC 32.6 31.5 - 35.7 g/dL   RDW 13.8 11.7 - 15.4 %   Platelets 279 150 - 450 x10E3/uL   Neutrophils 51 Not Estab. %   Lymphs 36 Not Estab. %   Monocytes 9 Not Estab. %   Eos 3 Not Estab. %   Basos 1 Not Estab. %   Neutrophils Absolute 5.3 1.4 - 7.0 x10E3/uL   Lymphocytes Absolute 3.6 (H) 0.7 - 3.1 x10E3/uL   Monocytes Absolute 1.0 (H) 0.1 - 0.9 x10E3/uL   EOS (ABSOLUTE) 0.3 0.0 - 0.4 x10E3/uL   Basophils Absolute 0.1 0.0 - 0.2 x10E3/uL   Immature Granulocytes 0 Not Estab. %   Immature Grans (Abs) 0.0 0.0 - 0.1 x10E3/uL  Comprehensive metabolic panel     Status: Abnormal   Collection Time: 04/05/19  3:39 PM  Result Value Ref Range   Glucose 75 65 - 99 mg/dL   BUN 19 8 - 27 mg/dL   Creatinine, Ser 1.13 (H) 0.57 - 1.00 mg/dL   GFR calc non Af Amer 43 (L) >59 mL/min/1.73   GFR calc Af Amer 50 (L) >59 mL/min/1.73   BUN/Creatinine Ratio 17 12 - 28   Sodium 138 134 - 144 mmol/L   Potassium 4.2 3.5 - 5.2 mmol/L   Chloride 95 (L) 96 - 106 mmol/L   CO2 26 20 - 29 mmol/L   Calcium 9.5 8.7 - 10.3 mg/dL   Total Protein 6.7 6.0 - 8.5 g/dL   Albumin 4.4 3.6 - 4.6 g/dL   Globulin, Total 2.3 1.5 - 4.5 g/dL   Albumin/Globulin Ratio 1.9 1.2 - 2.2   Bilirubin Total 0.2 0.0 - 1.2 mg/dL   Alkaline Phosphatase 112 39 - 117 IU/L   AST 22 0 - 40 IU/L   ALT 23 0 - 32 IU/L  Hemoglobin A1c     Status: Abnormal   Collection Time: 04/05/19  3:39 PM  Result Value Ref Range   Hgb A1c MFr Bld 7.9 (H) 4.8 - 5.6 %    Comment:          Prediabetes: 5.7 - 6.4          Diabetes:  >6.4  Glycemic control for adults with diabetes: <7.0    Est. average glucose Bld gHb Est-mCnc 180 mg/dL  SARS CORONAVIRUS 2 Nasal Swab Aptima Multi Swab     Status: None   Collection Time: 05/07/19  1:03 PM   Specimen: Aptima Multi Swab; Nasal Swab  Result Value Ref Range   SARS Coronavirus 2 NEGATIVE NEGATIVE    Comment: (NOTE) SARS-CoV-2 target nucleic acids are NOT DETECTED. The SARS-CoV-2 RNA is generally detectable in upper and lower respiratory specimens during the acute phase of infection. Negative results do not preclude SARS-CoV-2 infection, do not rule out co-infections with other pathogens, and should not be used as the sole basis for treatment or other patient management decisions. Negative results must be combined with clinical observations, patient history, and epidemiological information. The expected result is Negative. Fact Sheet for Patients: SugarRoll.be Fact Sheet for Healthcare Providers: https://www.woods-mathews.com/ This test is not yet approved or cleared by the [place] FDA and  has been authorized for detection and/or diagnosis of SARS-CoV-2 by FDA under an Emergency Use Authorization (EUA). This EUA will remain  in effect (meaning this test can be used) for the duration of the COVID-19 declaration under Section 56 4(b)(1) of the Act, 21 U.S.C. section 360bbb-3(b)(1), unless the authorization is terminated or revoked sooner. Performed at [facility], [place] [address], [place], [place] [address]   Glucose, capillary     Status: Abnormal   Collection Time: 05/10/19  8:02 AM  Result Value Ref Range   Glucose-Capillary 127 (H) 70 - 99 mg/dL  BUN     Status: None   Collection Time: 05/10/19  8:03 AM  Result Value Ref Range   BUN 15 8 - 23 mg/dL    Comment: Performed at [facility], [address] [place], [place] [address]  Creatinine, serum     Status: Abnormal   Collection Time:  05/10/19  8:03 AM  Result Value Ref Range   Creatinine, Ser 1.13 (H) 0.44 - 1.00 mg/dL   GFR calc non Af Amer 43 (L) >60 mL/min   GFR calc Af Amer 50 (L) >60 mL/min    Comment: Performed at [facility], [address] [place], [place] [address]  Glucose, capillary     Status: Abnormal   Collection Time: 05/10/19 10:34 AM  Result Value Ref Range   Glucose-Capillary 143 (H) 70 - 99 mg/dL    Radiology No results found.  Assessment/Plan Type 2 diabetes mellitus (HCC) blood glucose control important in reducing the progression of atherosclerotic disease. Also, involved in wound healing. On appropriate medications.   Benign essential HTN blood pressure control important in reducing the progression of atherosclerotic disease. On appropriate oral medications.  Celiac artery stenosis (HCC) Her duplex today showed normal velocities in her celiac artery.  Her SMA velocities remain elevated, but on angiogram that did not correlate with a significant stenosis. Symptoms are markedly improved.  Continue current medical regimen.  Plan to recheck in 6 months with mesenteric duplex  Carotid artery narrowing No new symptoms.  We will plan to check that at her next visit in 6 months.  PVD (peripheral vascular disease) ([place]) ABIs today are in the normal range at 1.0 on the right and 0.98 on the left.  Triphasic waveforms distally.  Perfusion is currently intact.  Recheck in 1 year.    Leotis Pain, MD  06/12/2019 10:34 AM    This note was created with Dragon medical transcription system.  Any errors from  dictation are purely unintentional

## 2019-06-12 NOTE — Assessment & Plan Note (Signed)
No new symptoms.  We will plan to check that at her next visit in 6 months.

## 2019-06-12 NOTE — Assessment & Plan Note (Signed)
ABIs today are in the normal range at 1.0 on the right and 0.98 on the left.  Triphasic waveforms distally.  Perfusion is currently intact.  Recheck in 1 year.

## 2019-06-12 NOTE — Assessment & Plan Note (Signed)
Her duplex today showed normal velocities in her celiac artery.  Her SMA velocities remain elevated, but on angiogram that did not correlate with a significant stenosis. Symptoms are markedly improved.  Continue current medical regimen.  Plan to recheck in 6 months with mesenteric duplex

## 2019-06-20 ENCOUNTER — Encounter: Payer: Self-pay | Admitting: Family Medicine

## 2019-06-20 ENCOUNTER — Other Ambulatory Visit: Payer: Self-pay

## 2019-06-20 ENCOUNTER — Ambulatory Visit (INDEPENDENT_AMBULATORY_CARE_PROVIDER_SITE_OTHER): Payer: Medicare Other | Admitting: Family Medicine

## 2019-06-20 VITALS — BP 132/76 | HR 76 | Resp 16 | Wt 151.0 lb

## 2019-06-20 DIAGNOSIS — R443 Hallucinations, unspecified: Secondary | ICD-10-CM | POA: Diagnosis not present

## 2019-06-20 DIAGNOSIS — E7800 Pure hypercholesterolemia, unspecified: Secondary | ICD-10-CM

## 2019-06-20 DIAGNOSIS — J449 Chronic obstructive pulmonary disease, unspecified: Secondary | ICD-10-CM

## 2019-06-20 DIAGNOSIS — I10 Essential (primary) hypertension: Secondary | ICD-10-CM

## 2019-06-20 DIAGNOSIS — Z23 Encounter for immunization: Secondary | ICD-10-CM | POA: Diagnosis not present

## 2019-06-20 DIAGNOSIS — G3184 Mild cognitive impairment, so stated: Secondary | ICD-10-CM

## 2019-06-20 DIAGNOSIS — E1121 Type 2 diabetes mellitus with diabetic nephropathy: Secondary | ICD-10-CM | POA: Diagnosis not present

## 2019-06-20 DIAGNOSIS — Z794 Long term (current) use of insulin: Secondary | ICD-10-CM

## 2019-06-20 LAB — POCT URINALYSIS DIPSTICK
Bilirubin, UA: NEGATIVE
Blood, UA: NEGATIVE
Glucose, UA: NEGATIVE
Ketones, UA: NEGATIVE
Leukocytes, UA: NEGATIVE
Nitrite, UA: NEGATIVE
Protein, UA: POSITIVE — AB
Spec Grav, UA: 1.02 (ref 1.010–1.025)
Urobilinogen, UA: 0.2 E.U./dL
pH, UA: 6.5 (ref 5.0–8.0)

## 2019-06-20 MED ORDER — ATORVASTATIN CALCIUM 10 MG PO TABS: 10.0000 mg | ORAL_TABLET | Freq: Every day | ORAL | 11 refills | Status: DC

## 2019-06-20 NOTE — Progress Notes (Signed)
Patient: [name] Female    DOB: 08/07/1929   85 y.o.   MRN: [id number] Visit Date: 06/20/2019  Today's Provider: Wilhemena Durie, MD   No chief complaint on file.  Subjective:   HPI    Hypertension, follow-up:  BP Readings from Last 3 Encounters:  06/12/19 (!) 181/75  05/10/19 132/60  04/30/19 (!) 192/80    She was last seen for hypertension 2 months ago.  BP at that visit was controlled.. Management since that visit includes CCB/ARB. She reports good compliance with treatment. She is not having side effects.  She is not exercising. She is not adherent to low salt diet.   Outside blood pressures are good.. She is experiencing none.  Patient denies none.   Cardiovascular risk factors include diabetes mellitus.  Use of agents associated with hypertension: none.     Weight trend: stable Wt Readings from Last 3 Encounters:  06/12/19 148 lb (67.1 kg)  05/10/19 148 lb (67.1 kg)  04/30/19 148 lb (67.1 kg)    Current diet: not asked     Diabetes Mellitus Type II, Follow-up:   Lab Results  Component Value Date   HGBA1C 7.9 (H) 04/05/2019   HGBA1C 6.8 01/25/2017    Last seen for diabetes 2 months ago.  Management since then includes meds. She reports good compliance with treatment. She is not having side effects.  Current symptoms include none and have been stable. Home blood sugar records: trend: stable  Episodes of hypoglycemia? no   Current insulin regiment: lantus/novolog Most Recent Eye Exam: 1 year. Weight trend: stable Prior visit with dietician: Yes  Current exercise: none Current diet habits: not asked  Pertinent Labs:    Component Value Date/Time   CHOL 192 09/13/2017 0823   CHOL 237 (H) 05/26/2016 1450   TRIG 252 (H) 09/13/2017 0823   HDL 52 09/13/2017 0823   HDL 54 05/26/2016 1450   LDLCALC 104 (H) 09/13/2017 0823   CREATININE 1.13 (H) 05/10/2019 0803   CREATININE 1.19 (H) 06/07/2017 1559     Allergies   Allergen Reactions  . Neomycin-Bacitracin Zn-Polymyx Swelling and Rash  . Latex Rash  . Lidocaine Rash  . Aricept [Donepezil Hcl] Diarrhea  . Benzalkonium Chloride Itching and Swelling  . Ibuprofen     Other reaction(s): Dizziness Heart fluttering. Tachycardia. Tachycardia.  . Valdecoxib Nausea And Vomiting  . Albuterol Rash  . Tape Rash    blisters  . Triamcinolone Rash     Current Outpatient Medications:  .  albuterol (VENTOLIN HFA) 108 (90 Base) MCG/ACT inhaler, INHALE 2 PUFFS INTO THE LUNGS EVERY 4 HOURS AS NEEDED FOR WHEEZING ORSHORTNESS OF BREATH, Disp: 8.5 g, Rfl: 0 .  Ascorbic Acid (VITAMIN C) 1000 MG tablet, Take 1,000 mg by mouth daily. , Disp: , Rfl:  .  aspirin 325 MG tablet, Take 325 mg by mouth daily. Reported on 01/05/2016, Disp: , Rfl:  .  atorvastatin (LIPITOR) 10 MG tablet, Take 1 tablet (10 mg total) by mouth daily., Disp: 30 tablet, Rfl: 11 .  benzonatate (TESSALON) 100 MG capsule, TAKE 1 CAPSULE BY MOUTH 3 TIMES DAILY ASNEEDED FOR COUGH, Disp: 90 capsule, Rfl: 0 .  cetirizine (ZYRTEC) 10 MG tablet, TAKE ONE TABLET BY MOUTH EVERY DAY, Disp: 90 tablet, Rfl: 3 .  cholecalciferol (VITAMIN D3) 25 MCG (1000 UT) tablet, Take 1,000 Units by mouth daily., Disp: , Rfl:  .  clopidogrel (PLAVIX) 75 MG tablet, Take 1 tablet (75 mg  total) by mouth daily., Disp: 30 tablet, Rfl: 11 .  diltiazem (TIAZAC) 360 MG 24 hr capsule, TAKE 1 CAPSULE BY MOUTH EVERY DAY, Disp: 90 capsule, Rfl: 3 .  ezetimibe (ZETIA) 10 MG tablet, Take 1 tablet (10 mg total) by mouth daily., Disp: 30 tablet, Rfl: 12 .  FLUoxetine (PROZAC) 40 MG capsule, Take 1 capsule (40 mg total) by mouth daily., Disp: 30 capsule, Rfl: 12 .  glucose blood test strip, ACCU-CHEK AVIVA PLUS TEST STRP Use to check sugars 3 times daily., Disp: , Rfl:  .  insulin glargine (LANTUS) 100 UNIT/ML injection, Inject 0.05 mLs (5 Units total) into the skin at bedtime. (Patient taking differently: Inject 10 Units into the skin at  bedtime. ), Disp: 10 mL, Rfl: 11 .  insulin lispro (HUMALOG KWIKPEN) 100 UNIT/ML KiwkPen, Inject 10 Units into the skin 4 (four) times daily. , Disp: , Rfl:  .  lansoprazole (PREVACID) 30 MG capsule, TAKE 1 CAPSULE BY MOUTH EVERY DAY AT NOON, Disp: 90 capsule, Rfl: 3 .  losartan (COZAAR) 100 MG tablet, TAKE ONE TABLET BY MOUTH EVERY DAY, Disp: 90 tablet, Rfl: 3 .  Magnesium 400 MG CAPS, Take by mouth daily., Disp: , Rfl:  .  memantine (NAMENDA) 5 MG tablet, TAKE ONE TABLET TWICE DAILY, Disp: 60 tablet, Rfl: 12 .  montelukast (SINGULAIR) 10 MG tablet, TAKE 1 TABLET BY MOUTH AT BEDTIME, Disp: 90 tablet, Rfl: 3 .  potassium chloride (K-DUR,KLOR-CON) 10 MEQ tablet, TAKE ONE TABLET BY MOUTH TWICE DAILY, Disp: 180 tablet, Rfl: 3 .  traZODone (DESYREL) 50 MG tablet, TAKE 1 AND 1/2 TABLET AT BEDTIME AS NEEDED FOR SLEEP, Disp: 45 tablet, Rfl: 11 .  vitamin E 1000 UNIT capsule, Take 1,000 Units by mouth daily., Disp: , Rfl:   Review of Systems  Constitutional: Negative.   HENT: Negative.   Eyes: Negative.   Respiratory: Negative.   Cardiovascular: Negative.   Gastrointestinal: Negative.   Endocrine: Negative.   Genitourinary: Negative.   Musculoskeletal: Positive for back pain.  Skin: Negative.   Allergic/Immunologic: Negative.   Neurological: Negative.   Hematological: Negative.   Psychiatric/Behavioral: Positive for hallucinations.       Visual.    Social History   Tobacco Use  . Smoking status: Never Smoker  . Smokeless tobacco: Never Used  Substance Use Topics  . Alcohol use: No      Objective:   There were no vitals taken for this visit. There were no vitals filed for this visit.There is no height or weight on file to calculate BMI.   Physical Exam Vitals signs reviewed.  Constitutional:      Appearance: She is well-developed.  HENT:     Head: Normocephalic and atraumatic.  Eyes:     General: No scleral icterus.    Conjunctiva/sclera: Conjunctivae normal.  Neck:      Thyroid: No thyromegaly.     Vascular: No carotid bruit.  Cardiovascular:     Rate and Rhythm: Normal rate and regular rhythm.  Pulmonary:     Effort: Pulmonary effort is normal.  Skin:    General: Skin is warm and dry.     Findings: Laceration present.     Comments: Very fair skin.  Neurological:     Mental Status: She is alert and oriented to person, place, and time.  Psychiatric:        Mood and Affect: Mood normal.        Behavior: Behavior normal.   MMSE 29/29  No results found for any visits on 06/20/19.     Assessment & Plan    1. Benign essential HTN  Controlled losartan ,diltiazem. More than 50% 25 minute visit spent in counseling or coordination of care  2. Type 2 diabetes mellitus with diabetic nephropathy, with long-term current use of insulin (HCC) Last A1C 7.9  3. Chronic obstructive pulmonary disease, unspecified COPD type ([place]) Refer back to pulmonary.Pt continues with chronic cough. - Ambulatory referral to Pulmonology  4. Hallucinations  - Ambulatory referral to Neurology - POCT urinalysis dipstick  5. Mild cognitive impairment Possible lewy body dementia.  MMSE good today.  6. Hypercholesteremia  - atorvastatin (LIPITOR) 10 MG tablet; Take 1 tablet (10 mg total) by mouth daily.  Dispense: 30 tablet; Refill: 11  7. Flu vaccine need  - Flu Vaccine QUAD High Dose(Fluad)     Wilhemena Durie, MD  [facility]

## 2019-06-20 NOTE — Patient Outreach (Signed)
[facility]) Care Management  06/20/2019  MAGDALINA KEINER 1928/10/21 [id number]   Medication Adherence call to Mrs. [place] spoke with patients daughter patient is past due on Losartan 100 mg she explain she receives a pill pack every month patient has medication at this time. Mrs. Sitzman is showing past due under [place].   [place] [facility] Management Direct Dial [phone]  Fax [phone] [email or web address]

## 2019-07-02 ENCOUNTER — Other Ambulatory Visit: Payer: Self-pay | Admitting: Family Medicine

## 2019-07-02 DIAGNOSIS — J449 Chronic obstructive pulmonary disease, unspecified: Secondary | ICD-10-CM

## 2019-07-02 NOTE — Telephone Encounter (Signed)
Pharmacy requesting refills. Thanks!  

## 2019-07-04 ENCOUNTER — Other Ambulatory Visit: Payer: Self-pay

## 2019-07-04 ENCOUNTER — Ambulatory Visit: Payer: Medicare Other | Admitting: Internal Medicine

## 2019-07-04 ENCOUNTER — Encounter: Payer: Self-pay | Admitting: Internal Medicine

## 2019-07-04 VITALS — BP 152/80 | HR 94 | Temp 97.9°F | Ht 62.0 in | Wt 150.0 lb

## 2019-07-04 DIAGNOSIS — J411 Mucopurulent chronic bronchitis: Secondary | ICD-10-CM | POA: Diagnosis not present

## 2019-07-04 DIAGNOSIS — J479 Bronchiectasis, uncomplicated: Secondary | ICD-10-CM

## 2019-07-04 MED ORDER — FLUTTER DEVI: 0 refills | Status: DC

## 2019-07-04 MED ORDER — ALBUTEROL SULFATE HFA 108 (90 BASE) MCG/ACT IN AERS: INHALATION_SPRAY | RESPIRATORY_TRACT | 10 refills | Status: DC

## 2019-07-04 NOTE — Patient Instructions (Addendum)
Flutter Valve 10-15 times per day Incentive Spirometry 10-15 times per day Ventolin as needed mostly at night Cough syrup as needed

## 2019-07-04 NOTE — Progress Notes (Signed)
Name: [name] MRN: [id number] DOB: [birth year]     CONSULTATION DATE: .07/04/2019   REFERRING MD : Rosanna Randy  CHIEF COMPLAINT:  Follow-up chronic bronchitis Follow-up COPD  PREVIOUS HISTORY OF PRESENT ILLNESS:   85 yo white female seen with chronic cough for more than 10 years-intermittent  +second hand smoke exposure +h/o allergic rhinitis(on zytrtec,singulair) +h/o GERD (on protonix) She is NON smoker She worked as Nurse, learning disability panty hose pneumonia 3 years ago Has had prednisone use 10 years ago for psoriasis She has been prescribed albuterol asn has not made a difference Offcie SPiro-WNL Flow volume loops show some evidence of scooping which could be related to obstructive airways disease   Current HPI No  COPD exacerbation at this time No evidence of heart failure at this time No evidence or signs of infection at this time  No fevers, chills, nausea, vomiting, diarrhea No evidence of lower extremity edema No evidence hemoptysis Patient is steroid responsive No signs of acute distress at this time   No signs of heart failure at this time    Review of Systems:  Gen:  Denies  fever, sweats, chills weight loss  HEENT: Denies blurred vision, double vision, ear pain, eye pain, hearing loss, nose bleeds, sore throat Cardiac:  No dizziness, chest pain or heaviness, chest tightness,edema, No JVD Resp:   No cough, -sputum production, -shortness of breath,-wheezing, -hemoptysis,  Gi: Denies swallowing difficulty, stomach pain, nausea or vomiting, diarrhea, constipation, bowel incontinence Gu:  Denies bladder incontinence, burning urine Ext:   Denies Joint pain, stiffness or swelling Skin: Denies  skin rash, easy bruising or bleeding or hives Endoc:  Denies polyuria, polydipsia , polyphagia or weight change Psych:   Denies depression, insomnia or hallucinations  Other:  All other systems negative   BP (!) 152/80 (BP Location: Left Arm,  Cuff Size: Normal)   Pulse 94   Temp 97.9 F (36.6 C) (Temporal)   Ht 5\' 2"  (1.575 m)   Wt 150 lb (68 kg)   SpO2 97%   BMI 27.44 kg/m    Physical Examination:   GENERAL:NAD, no fevers, chills, no weakness no fatigue HEAD: Normocephalic, atraumatic.  EYES: PERLA, EOMI No scleral icterus.  NECK: Supple. No thyromegaly.  No JVD.  PULMONARY: CTA B/L no wheezing, rhonchi, crackles CARDIOVASCULAR: S1 and S2. Regular rate and rhythm. No murmurs GASTROINTESTINAL: Soft, nontender, nondistended. Positive bowel sounds.  MUSCULOSKELETAL: No swelling, clubbing, or edema.  NEUROLOGIC: No gross focal neurological deficits. 5/5 strength all extremities SKIN: No ulceration, lesions, rashes, or cyanosis.  PSYCHIATRIC: Insight, judgment intact. -depression -anxiety ALL OTHER ROS ARE NEGATIVE       CT chest October 2017  No PE no effusions no opacities  Some evidence of bronchiectasis in the lower lobes      ASSESSMENT / PLAN: Chronic cough with chronic sputum production consistent with COPD pattern on flow volume loops suggestive of chronic bronchitis with COPD in the setting of bilateral lower lobe bronchiectasis Avoid secondhand smoke exposure Previously placed on Advair HFA Albuterol 2 to 4 puffs every 4 hours as needed   Bronchiectasis Patient needs aggressive bronchial hygiene Continue flutter valve 10-15 times per day Continue incentive spirometry 10-15 times per day   COVID-19 EDUCATION: The signs and symptoms of COVID-19 were discussed with the patient and how to seek care for testing.  The importance of social distancing was discussed today. Hand Washing Techniques and avoid touching face was advised.   MEDICATION ADJUSTMENTS/LABS AND TESTS  ORDERED: Flutter Valve 10-15 times per day Incentive Spirometry 10-15 times per day Ventolin as needed mostly at night Cough syrup as needed   CURRENT MEDICATIONS REVIEWED AT [facility]   Patient satisfied with  Plan of action and management. All questions answered  Follow up in 1 year   Adaeze Better [name], M.D.  [facility]  Medical Director [place] Director [facility] Cardio-Pulmonary Department

## 2019-07-10 ENCOUNTER — Telehealth: Payer: Self-pay

## 2019-07-12 ENCOUNTER — Telehealth: Payer: Self-pay

## 2019-07-13 DIAGNOSIS — J449 Chronic obstructive pulmonary disease, unspecified: Secondary | ICD-10-CM | POA: Diagnosis not present

## 2019-07-17 ENCOUNTER — Telehealth: Payer: Self-pay

## 2019-07-17 ENCOUNTER — Ambulatory Visit: Payer: Self-pay

## 2019-07-17 ENCOUNTER — Telehealth: Payer: Self-pay | Admitting: Internal Medicine

## 2019-07-17 MED ORDER — AZITHROMYCIN 250 MG PO TABS: ORAL_TABLET | ORAL | 0 refills | Status: AC

## 2019-07-17 MED ORDER — PREDNISONE 20 MG PO TABS: 20.0000 mg | ORAL_TABLET | Freq: Every day | ORAL | 0 refills | Status: DC

## 2019-07-17 NOTE — Telephone Encounter (Signed)
z pak Prednisone 20 mg daily for 5 days

## 2019-07-17 NOTE — Telephone Encounter (Signed)
Spoke to pt's daughter, [name](DPR). [name] stated that pt's cough has worsen since last office visit.  Pt is unable to speak more then 3 words or sleep due to cough. [name] stated that cough sounds "wet" but pt is not producing mucus.  Sob is baseline. Pt has developed a headache today. Pt is using OTC tussin DM and tessalon pearls nightly.  Pt not currently using flutter device, due to cough worsening.  [name] is concerned that pt may be developing PNA.  Denied additional symptoms.   DK please advise. Thanks

## 2019-07-17 NOTE — Telephone Encounter (Signed)
[name] is aware of below recommendations and voiced her understanding.  Rx for zpak and prednisone has been sent [facility] preferred pharmacy.  Allergy did show for Triamcinolone when ordering prednisone. Pt has taken prednisone previously without reaction per [name].  Per LG verbally- okay to order.  Nothing further is needed.

## 2019-07-18 NOTE — Chronic Care Management (AMB) (Addendum)
  Chronic Care Management   Outreach Note   Name: [name] MRN: [id number] DOB: [birth year]  Referred by: Jerrol Banana., MD Reason for referral : Chronic Care Management   An unsuccessful telephone outreach was attempted today. The patient was referred to the case management team for assistance with care management and care coordination. A HIPAA compliant voice message was left requesting a return call.   Follow Up Plan: The care management team will attempt outreach within the next two weeks.  [facility] Care Management [phone]

## 2019-07-25 DIAGNOSIS — L570 Actinic keratosis: Secondary | ICD-10-CM | POA: Diagnosis not present

## 2019-07-25 DIAGNOSIS — L821 Other seborrheic keratosis: Secondary | ICD-10-CM | POA: Diagnosis not present

## 2019-07-25 DIAGNOSIS — L578 Other skin changes due to chronic exposure to nonionizing radiation: Secondary | ICD-10-CM | POA: Diagnosis not present

## 2019-07-25 DIAGNOSIS — Z85828 Personal history of other malignant neoplasm of skin: Secondary | ICD-10-CM | POA: Diagnosis not present

## 2019-07-26 ENCOUNTER — Ambulatory Visit (INDEPENDENT_AMBULATORY_CARE_PROVIDER_SITE_OTHER): Payer: Medicare Other

## 2019-07-26 DIAGNOSIS — Z794 Long term (current) use of insulin: Secondary | ICD-10-CM | POA: Diagnosis not present

## 2019-07-26 DIAGNOSIS — I10 Essential (primary) hypertension: Secondary | ICD-10-CM | POA: Diagnosis not present

## 2019-07-26 DIAGNOSIS — J449 Chronic obstructive pulmonary disease, unspecified: Secondary | ICD-10-CM

## 2019-07-26 DIAGNOSIS — E1121 Type 2 diabetes mellitus with diabetic nephropathy: Secondary | ICD-10-CM | POA: Diagnosis not present

## 2019-08-08 ENCOUNTER — Telehealth: Payer: Self-pay | Admitting: Internal Medicine

## 2019-08-08 NOTE — Telephone Encounter (Signed)
Spoke to pt's daughter, Jones Broom (DPR). [name] stated since pt has started flutter valve, pt's cough has worsen. Pt is unable to have a conversation due to cough.  Cough is non prod cough.  Pt has scheduled for phone visit on 08/09/2019 at 10:30 with DK. Nothing further is needed at this time.

## 2019-08-09 ENCOUNTER — Encounter: Payer: Self-pay | Admitting: Internal Medicine

## 2019-08-09 ENCOUNTER — Ambulatory Visit (INDEPENDENT_AMBULATORY_CARE_PROVIDER_SITE_OTHER): Payer: Medicare Other | Admitting: Internal Medicine

## 2019-08-09 ENCOUNTER — Other Ambulatory Visit: Payer: Self-pay

## 2019-08-09 DIAGNOSIS — J449 Chronic obstructive pulmonary disease, unspecified: Secondary | ICD-10-CM | POA: Diagnosis not present

## 2019-08-09 DIAGNOSIS — J479 Bronchiectasis, uncomplicated: Secondary | ICD-10-CM | POA: Diagnosis not present

## 2019-08-09 MED ORDER — PREDNISONE 20 MG PO TABS: 20.0000 mg | ORAL_TABLET | Freq: Every day | ORAL | 1 refills | Status: DC

## 2019-08-09 MED ORDER — GUAIFENESIN-DM 100-10 MG/5ML PO SYRP: 5.0000 mL | ORAL_SOLUTION | ORAL | 0 refills | Status: DC | PRN

## 2019-08-09 NOTE — Progress Notes (Signed)
Name: [name] MRN: [id number] DOB: 12/18/1928     I connected with the patient by telephone enabled telemedicine visit and verified that I am speaking with the correct person using two identifiers.    I discussed the limitations, risks, security and privacy concerns of performing an evaluation and management service by telemedicine and the availability of in-person appointments. I also discussed with the patient that there may be a patient responsible charge related to this service. The patient expressed understanding and agreed to proceed.  PATIENT AGREES AND CONFIRMS -YES   Other persons participating in the visit and their role in the encounter: Patient, nursing  This visit type was conducted due to national recommendations for restrictions regarding the COVID-19 Pandemic (e.g. social distancing).  This format is felt to be most appropriate for this patient at this time.  All issues noted in this document were discussed and addressed.       CONSULTATION DATE: .08/09/2019   REFERRING MD : Rosanna Randy  CHIEF COMPLAINT:  Follow up COPD Follow up Chronic bronchitis  PREVIOUS HISTORY OF PRESENT ILLNESS:   85 yo white female seen with chronic cough for more than 10 years-intermittent  +second hand smoke exposure +h/o allergic rhinitis(on zytrtec,singulair) +h/o GERD (on protonix) She is NON smoker She worked as Nurse, learning disability panty hose pneumonia 3 years ago Has had prednisone use 10 years ago for psoriasis She has been prescribed albuterol asn has not made a difference Offcie SPiro-WNL Flow volume loops show some evidence of scooping which could be related to obstructive airways disease  HPI + COPD exacerbation at this time No evidence of heart failure at this time No evidence or signs of infection at this time No respiratory distress No fevers, chills, nausea, vomiting, diarrhea No evidence of lower extremity edema No evidence hemoptysis     Review of Systems:  Gen:  Denies  fever, sweats, chills weight loss  HEENT: Denies blurred vision, double vision, ear pain, eye pain, hearing loss, nose bleeds, sore throat Cardiac:  No dizziness, chest pain or heaviness, chest tightness,edema, No JVD Resp:   No cough, -sputum production, -shortness of breath,-wheezing, -hemoptysis,  Gi: Denies swallowing difficulty, stomach pain, nausea or vomiting, diarrhea, constipation, bowel incontinence Gu:  Denies bladder incontinence, burning urine Ext:   Denies Joint pain, stiffness or swelling Skin: Denies  skin rash, easy bruising or bleeding or hives Endoc:  Denies polyuria, polydipsia , polyphagia or weight change Psych:   Denies depression, insomnia or hallucinations  Other:  All other systems negative           CT chest October 2017  No PE no effusions no opacities  Some evidence of bronchiectasis in the lower lobes      ASSESSMENT / PLAN:  Chronic cough with chronic sputum production consistent with COPD pattern on flow volume loop suggestive of chronic bronchitis with COPD in the setting of bilateral low bronchiectasis  Continue Advair HFA as prescribed Albuterol as needed 2 to 4 puffs every 4 hours as needed  Bronchiectasis Patient would benefit from aggressive bronchial hygiene Continue flutter valve 10-15 times per day Continue incentive spirometry 10-15 times per day  Assess patient for chest physiotherapy She has b/l bronchiectasis and has tried and failed flutter valve and incentive spirometry      COPD exacerbation Prednisone 20 mg daily for 10 days cough syrup      COVID-19 EDUCATION: The signs and symptoms of COVID-19 were discussed with the patient and how  to seek care for testing.  The importance of social distancing was discussed today. Hand Washing Techniques and avoid touching face was advised.   MEDICATION ADJUSTMENTS/LABS AND TESTS ORDERED: Continue flutter valve and incentive spirometry  as prescribed Continue inhalers as prescribed   CURRENT MEDICATIONS REVIEWED AT LENGTH WITH PATIENT TODAY   Patient satisfied with Plan of action and management. All questions answered  Follow up in 1 year  Total time spent 22 minutes   Maretta Bees [name], M.D.  [facility]  Medical Director [place] Director [facility] Cardio-Pulmonary Department

## 2019-08-09 NOTE — Addendum Note (Signed)
Addended by: Maryanna Shape A on: 08/09/2019 10:46 AM   Modules accepted: Orders

## 2019-08-09 NOTE — Patient Instructions (Addendum)
Continue incentive spirometry as prescribed Continue flutter valve as prescribed Continue inhalers as prescribed  Prednisone 20 mg daily for 10 days Cough syrup as needed   Assess patient for chest physiotherapy She has b/l bronchiectasis and has tried and failed flutter valve and incentive spirometry

## 2019-08-11 ENCOUNTER — Other Ambulatory Visit: Payer: Self-pay | Admitting: Family Medicine

## 2019-08-14 DIAGNOSIS — E519 Thiamine deficiency, unspecified: Secondary | ICD-10-CM | POA: Diagnosis not present

## 2019-08-14 DIAGNOSIS — R413 Other amnesia: Secondary | ICD-10-CM | POA: Diagnosis not present

## 2019-08-14 DIAGNOSIS — E538 Deficiency of other specified B group vitamins: Secondary | ICD-10-CM | POA: Diagnosis not present

## 2019-08-16 ENCOUNTER — Telehealth: Payer: Self-pay

## 2019-08-16 ENCOUNTER — Other Ambulatory Visit (HOSPITAL_COMMUNITY): Payer: Self-pay | Admitting: Neurology

## 2019-08-16 ENCOUNTER — Other Ambulatory Visit: Payer: Self-pay | Admitting: Neurology

## 2019-08-16 ENCOUNTER — Ambulatory Visit: Payer: Self-pay

## 2019-08-16 DIAGNOSIS — R413 Other amnesia: Secondary | ICD-10-CM

## 2019-08-16 NOTE — Chronic Care Management (AMB) (Signed)
  Chronic Care Management   Outreach Note  08/16/2019 Name: ARNETTA WUBBEN MRN: [id number] DOB: 09/06/1929  Primary Care Provider: Jerrol Banana., MD   An unsuccessful telephone outreach was attempted today. Mrs. Higgs was referred to the case management team for assistance with care management and care coordination. A HIPAA compliant voice message was left requesting a return call.   Follow Up Plan The care management team will reach out again over the next two to three weeks.    [facility] Care Management [phone]

## 2019-08-18 ENCOUNTER — Other Ambulatory Visit: Payer: Self-pay | Admitting: Internal Medicine

## 2019-08-18 DIAGNOSIS — J449 Chronic obstructive pulmonary disease, unspecified: Secondary | ICD-10-CM

## 2019-08-20 NOTE — Telephone Encounter (Signed)
DK please advise?

## 2019-08-31 ENCOUNTER — Ambulatory Visit: Payer: Medicare Other

## 2019-09-06 ENCOUNTER — Ambulatory Visit (INDEPENDENT_AMBULATORY_CARE_PROVIDER_SITE_OTHER): Payer: Medicare Other

## 2019-09-06 DIAGNOSIS — I10 Essential (primary) hypertension: Secondary | ICD-10-CM | POA: Diagnosis not present

## 2019-09-06 DIAGNOSIS — F0280 Dementia in other diseases classified elsewhere without behavioral disturbance: Secondary | ICD-10-CM | POA: Diagnosis not present

## 2019-09-06 DIAGNOSIS — E1121 Type 2 diabetes mellitus with diabetic nephropathy: Secondary | ICD-10-CM | POA: Diagnosis not present

## 2019-09-06 DIAGNOSIS — G3183 Dementia with Lewy bodies: Secondary | ICD-10-CM | POA: Diagnosis not present

## 2019-09-06 DIAGNOSIS — Z794 Long term (current) use of insulin: Secondary | ICD-10-CM | POA: Diagnosis not present

## 2019-09-08 ENCOUNTER — Ambulatory Visit
Admission: RE | Admit: 2019-09-08 | Discharge: 2019-09-08 | Disposition: A | Discharge status: Home / Self Care | Payer: Medicare Other | Source: Ambulatory Visit | Attending: Neurology | Admitting: Neurology

## 2019-09-08 ENCOUNTER — Other Ambulatory Visit: Payer: Self-pay

## 2019-09-08 DIAGNOSIS — R413 Other amnesia: Secondary | ICD-10-CM | POA: Insufficient documentation

## 2019-09-08 DIAGNOSIS — R42 Dizziness and giddiness: Secondary | ICD-10-CM | POA: Diagnosis not present

## 2019-09-08 LAB — POCT I-STAT CREATININE: Creatinine, Ser: 1.2 mg/dL — ABNORMAL HIGH (ref 0.44–1.00)

## 2019-09-08 MED ORDER — GADOBUTROL 1 MMOL/ML IV SOLN
6.0000 mL | Freq: Once | INTRAVENOUS | Status: AC | PRN
  Administered 2019-09-08: 6 mL via INTRAVENOUS

## 2019-09-17 ENCOUNTER — Other Ambulatory Visit: Payer: Self-pay | Admitting: Family Medicine

## 2019-09-25 ENCOUNTER — Telehealth: Payer: Self-pay

## 2019-09-25 NOTE — Telephone Encounter (Signed)
Copied from [place] [id number]. Topic: General - Other >> Sep 25, 2019  1:42 PM Greggory Keen D wrote: Reason for CRM: Pt called saying she is still having some bleeding in her stools.  She said she has been to the hospital and Dr. Lucky Cowboy did a procedure but she does not think it helped.  She would like Dr. Marlan Palau on what to do next.  CB#  [phone]

## 2019-09-25 NOTE — Telephone Encounter (Signed)
Please advise 

## 2019-09-26 ENCOUNTER — Telehealth: Payer: Self-pay | Admitting: Internal Medicine

## 2019-09-26 NOTE — Telephone Encounter (Signed)
Called and spoke to pt's daughter, [name](DPR). [name] is requesting a Maintenance dose of prednisone, as prednisone helps with pt's cough. Pt currently taking 10mg  of prednisone that she had left over from previous prescription.     Dr. Mortimer Fries please advise. Thanks

## 2019-09-29 ENCOUNTER — Other Ambulatory Visit: Payer: Self-pay | Admitting: Family Medicine

## 2019-09-30 NOTE — Telephone Encounter (Signed)
Please proceed with patient request

## 2019-10-01 MED ORDER — PREDNISONE 20 MG PO TABS: 20.0000 mg | ORAL_TABLET | Freq: Every day | ORAL | 0 refills | Status: DC

## 2019-10-01 NOTE — Chronic Care Management (AMB) (Signed)
Chronic Care Management   Initial Visit Note   Name: [name] MRN: [id number] DOB: [birth year]  Primary Care Provider: Jerrol Banana., MD Reason for referral : Chronic Case Management   [name] is a 86 y.o. year old female who is a primary care patient of Jerrol Banana., MD. The CCM team was consulted for assistance with chronic disease management and care coordination. A telephonic assessment was conducted today with Ms. [name] and her daughter [name].  Review of Ms. Kuntzman's status, including review of consultants reports, relevant labs and test results was conducted today. Collaboration with appropriate care team members was performed as part of the comprehensive evaluation and provision of chronic care management services.     SDOH (Social Determinants of Health) screening performed today.  See Care Plan for related entries.   Medications: Outpatient Encounter Medications as of 07/26/2019  Medication Sig  . albuterol (VENTOLIN HFA) 108 (90 Base) MCG/ACT inhaler INHALE 2 PUFFS INTO THE LUNGS EVERY 4 HOURS AS NEEDED FOR WHEEZING ORSHORTNESS OF BREATH  . Ascorbic Acid (VITAMIN C) 1000 MG tablet Take 1,000 mg by mouth daily.   Marland Kitchen aspirin 325 MG tablet Take 325 mg by mouth daily. Reported on 01/05/2016  . atorvastatin (LIPITOR) 10 MG tablet Take 1 tablet (10 mg total) by mouth daily.  . benzonatate (TESSALON) 100 MG capsule TAKE 1 CAPSULE 3 TIMES DAILY AS NEEDED FOR COUGH  . cetirizine (ZYRTEC) 10 MG tablet TAKE ONE TABLET BY MOUTH EVERY DAY  . cholecalciferol (VITAMIN D3) 25 MCG (1000 UT) tablet Take 1,000 Units by mouth daily.  . clopidogrel (PLAVIX) 75 MG tablet Take 1 tablet (75 mg total) by mouth daily.  Marland Kitchen diltiazem (TIAZAC) 360 MG 24 hr capsule TAKE 1 CAPSULE BY MOUTH EVERY DAY  . ezetimibe (ZETIA) 10 MG tablet Take 1 tablet (10 mg total) by mouth daily.  Marland Kitchen FLUoxetine (PROZAC) 40 MG capsule Take 1 capsule (40 mg total) by mouth daily.  Marland Kitchen  glucose blood test strip ACCU-CHEK AVIVA PLUS TEST STRP Use to check sugars 3 times daily.  . insulin glargine (LANTUS) 100 UNIT/ML injection Inject 0.05 mLs (5 Units total) into the skin at bedtime. (Patient taking differently: Inject 10 Units into the skin at bedtime. )  . insulin lispro (HUMALOG KWIKPEN) 100 UNIT/ML KiwkPen Inject 10 Units into the skin 4 (four) times daily.   . Magnesium 400 MG CAPS Take by mouth daily.  . montelukast (SINGULAIR) 10 MG tablet TAKE 1 TABLET BY MOUTH AT BEDTIME  . potassium chloride (K-DUR,KLOR-CON) 10 MEQ tablet TAKE ONE TABLET BY MOUTH TWICE DAILY  . predniSONE (DELTASONE) 20 MG tablet Take 1 tablet (20 mg total) by mouth daily.  Marland Kitchen Respiratory Therapy Supplies (FLUTTER) DEVI Use as directed  . vitamin E 1000 UNIT capsule Take 1,000 Units by mouth daily.  . [DISCONTINUED] lansoprazole (PREVACID) 30 MG capsule TAKE 1 CAPSULE BY MOUTH EVERY DAY AT NOON  . [DISCONTINUED] losartan (COZAAR) 100 MG tablet TAKE ONE TABLET BY MOUTH EVERY DAY  . [DISCONTINUED] memantine (NAMENDA) 5 MG tablet TAKE ONE TABLET TWICE DAILY  . [DISCONTINUED] traZODone (DESYREL) 50 MG tablet TAKE 1 AND 1/2 TABLET AT BEDTIME AS NEEDED FOR SLEEP   No facility-administered encounter medications on file as of 07/26/2019.     Objective:  BP Readings from Last 3 Encounters:  07/04/19 (!) 152/80  06/20/19 132/76  06/12/19 (!) 181/75   Lab Results  Component Value Date   HGBA1C 7.9 (H) 04/05/2019  Lab Results  Component Value Date   CREATININE 1.20 (H) 09/08/2019   CREATININE 1.13 (H) 05/10/2019   CREATININE 1.13 (H) 04/05/2019    Goals Addressed            This Visit's Progress   . Chronic Disease Management        Current Barriers:  . Chronic Disease Management support and education needs related to Hypertension, Diabetes and COPD.   Case Manager Clinical Goal(s):  Marland Kitchen Over the next 90 days, patient will not be hospitalized for complications related to chronic illnesses.  . Over the next 90 days, patient will take all medications as prescribed. (Caregiver to assist) . Over the next 90 days, patient will attend all scheduled medical appointments.(Caregiver to assist) . Over the next 90 days, patient will monitor blood glucose levels daily and record readings. (Caregiver to assist) . Over the next 90 days, patient will monitor blood pressure. (Caregiver to assist) . Over the next 90 days, patient will follow recommended home safety measures to prevent falls.  Interventions:  . Reviewed medications with caregiver/daughter. Discussed indications for use. Daughter encouraged to continue preparing daily medications as prescribed. Encouraged to notify primary care provider or assigned specialist if patient is unable to tolerate the prescribed regimen. . Discussed daily blood glucose ranges. Provided education regarding s/sx of hypoglycemia and hyperglycemia along with recommended treatment interventions. Encouraged patient and daughter to monitor blood glucose daily and maintain a log. Daughter Tammy reports most readings have been within range. Reports printing sign for patient to hold insulin if blood glucose is below 157m/dl. . Discussed blood pressure parameters and indications for notifying MD. Encouraged to monitor at lease three times a week if unable to monitor daily. . Reviewed s/sx of complications related to COPD. Discussed indications for seeking immediate medical attention. Daughter Tammy reports patient recently developed a "wet" cough. No shortness of breath. Reports patient is taking azithromycin and prednisone as ordered by Pulmonologist. . Provided education regarding home safety and fall prevention. Encouraged to use assistive device when needed and ensure cane and walker are readily available. Daughter Tammy reports patient ambulates fairly well. Has a cane and walker to use if needed. Reports patient also has a emergency alert device. . Reviewed  schedule/provider appointments. Encouraged to attend all provider appointments to prevent delays in care. Daughter Tammy reports she and another family member assist with transportation. Declines current need for transportation assistance. . Discussed plans for ongoing care management follow up. Provided direct contact information for care management team.   Patient Self Care Activities:  . Performs ADL's independently . Requires assistance with IADLs and medication preparation.   Initial goal documentation        Ms. HRhineswas given information about Chronic Care Management services including:  1. CCM service includes personalized support from designated clinical staff supervised by her physician, including individualized plan of care and coordination with other care providers 2. 24/7 contact phone numbers for assistance for urgent and routine care needs. 3. Service will only be billed when office clinical staff spend 20 minutes or more in a month to coordinate care. 4. Only one practitioner may furnish and bill the service in a calendar month. 5. The patient may stop CCM services at any time (effective at the end of the month) by phone call to the office staff. 6. The patient will be responsible for cost sharing (co-pay) of up to 20% of the service fee (after annual deductible is met).  Ms. HColvinand  her daughter, [name] agreed to services. Verbal consent was obtained.   PLAN The care management team will reach out to Ms. Perras and her caregiver Tammy within the next 30 days.   [facility] Care Management [phone]

## 2019-10-01 NOTE — Patient Instructions (Addendum)
Thank you for allowing the Chronic Care Management team to participate in your care.  Goals Addressed            This Visit's Progress   . Chronic Disease Management        Current Barriers:  . Chronic Disease Management support and education needs related to Hypertension, Diabetes and COPD.   Case Manager Clinical Goal(s):  Marland Kitchen Over the next 90 days, patient will not be hospitalized for complications related to chronic illnesses. . Over the next 90 days, patient will take all medications as prescribed. (Caregiver to assist) . Over the next 90 days, patient will attend all scheduled medical appointments.(Caregiver to assist) . Over the next 90 days, patient will monitor blood glucose levels daily and record readings. (Caregiver to assist) . Over the next 90 days, patient will monitor blood pressure. (Caregiver to assist) . Over the next 90 days, patient will follow recommended home safety measures to prevent falls.  Interventions:  . Reviewed medications with caregiver/daughter. Discussed indications for use. Daughter encouraged to continue preparing daily medications as prescribed. Encouraged to notify primary care provider or assigned specialist if patient is unable to tolerate the prescribed regimen. . Discussed daily blood glucose ranges. Provided education regarding s/sx of hypoglycemia and hyperglycemia along with recommended treatment interventions. Encouraged patient and daughter to monitor blood glucose daily and maintain a log. Daughter Tammy reports most readings have been within range. Reports printing sign for patient to hold insulin if blood glucose is below 187m/dl. . Discussed blood pressure parameters and indications for notifying MD. Encouraged to monitor at lease three times a week if unable to monitor daily. . Reviewed s/sx of complications related to COPD. Discussed indications for seeking immediate medical attention. Daughter Tammy reports patient recently developed a  "wet" cough. No shortness of breath. Reports patient is taking azithromycin and prednisone as ordered by Pulmonologist. . Provided education regarding home safety and fall prevention. Encouraged to use assistive device when needed and ensure cane and walker are readily available. Daughter Tammy reports patient ambulates fairly well. Has a cane and walker to use if needed. Reports patient also has a emergency alert device. . Reviewed schedule/provider appointments. Encouraged to attend all provider appointments to prevent delays in care. Daughter Tammy reports she and another family member assist with transportation. Declines current need for transportation assistance. . Discussed plans for ongoing care management follow up. Provided direct contact information for care management team.   Patient Self Care Activities:  . Performs ADL's independently . Requires assistance with IADLs and medication preparation.   Initial goal documentation        Ms. Friedli/daughter Tammy were given information about Chronic Care Management servicesincluding:  1. CCM service includes personalized support from designated clinical staff supervised by her physician, including individualized plan of care and coordination with other care providers 2. 24/7 contact phone numbers for assistance for urgent and routine care needs. 3. Service will only be billed when office clinical staff spend 20 minutes or more in a month to coordinate care. 4. Only one practitioner may furnish and bill the service in a calendar month. 5. The patient may stop CCM services at any time (effective at the end of the month) by phone call to the office staff. 6. The patient will be responsible for cost sharing (co-pay) of up to 20% of the service fee (after annual deductible is met).   Patient/daughter Tammy agreed to services. Verbal consent obtained.     Ms. HBertiand  her daughter [name] verbalized understanding of the instructions  provided during the telephonic outreach today. Declined need for a mailed/printed copy of the instructions.   The care management team will follow up with Ms. Verge and her daughter [name] within the next 30 days.   Horris Latino [facility] Care Management [phone]

## 2019-10-01 NOTE — Telephone Encounter (Signed)
Prednisone 20 mg daily for 10 days

## 2019-10-01 NOTE — Telephone Encounter (Signed)
Called and spoke to pt's daughter, [name] (DPR) and relayed below recommendations.  Prednisone 20mg  #10 has been sent to preferred pharmacy. Nothing further is needed.

## 2019-10-01 NOTE — Telephone Encounter (Signed)
DK please verify dosage of prednisone. Thanks

## 2019-10-02 ENCOUNTER — Other Ambulatory Visit: Payer: Self-pay | Admitting: Family Medicine

## 2019-10-02 ENCOUNTER — Telehealth: Payer: Self-pay | Admitting: Internal Medicine

## 2019-10-02 DIAGNOSIS — I10 Essential (primary) hypertension: Secondary | ICD-10-CM

## 2019-10-02 NOTE — Telephone Encounter (Signed)
Lm for Melissa with [employer].  ?

## 2019-10-03 NOTE — Telephone Encounter (Signed)
Lm x2 for [name] with [employer].

## 2019-10-03 NOTE — Telephone Encounter (Signed)
Requested Prescriptions  Pending Prescriptions Disp Refills  . FLUoxetine (PROZAC) 40 MG capsule [Pharmacy Med Name: FLUOXETINE HCL 40 MG CAP] 30 capsule 12    Sig: TAKE 1 CAPSULE BY MOUTH EVERY DAY     Psychiatry:  Antidepressants - SSRI Passed - 10/02/2019  4:57 PM      Passed - Completed PHQ-2 or PHQ-9 in the last 360 days.      Passed - Valid encounter within last 6 months    Recent Outpatient Visits          3 months ago Benign essential HTN   [facility] Jerrol Banana., MD   6 months ago [place], [place], [place]   7 months ago Rectal bleeding   [facility] Jerrol Banana., MD   8 months ago Internal hemorrhoid   [facility] Jerrol Banana., MD   10 months ago Cystitis   [facility] [place], [place], [place]             . diltiazem (TIAZAC) 360 MG 24 hr capsule [Pharmacy Med Name: DILTIAZEM HCL ER BEADS 360 MG CAP] 90 capsule 3    Sig: TAKE 1 CAPSULE EVERY DAY     Cardiovascular:  Calcium Channel Blockers Failed - 10/02/2019  4:57 PM      Failed - Last BP in normal range    BP Readings from Last 1 Encounters:  07/04/19 (!) 152/80         Passed - Valid encounter within last 6 months    Recent Outpatient Visits          3 months ago Benign essential HTN   [facility] Jerrol Banana., MD   6 months ago [place], [place]   7 months ago Rectal bleeding   [facility] Jerrol Banana., MD   8 months ago Internal hemorrhoid   [facility] Jerrol Banana., MD   10 months ago [place], [place]

## 2019-10-09 NOTE — Telephone Encounter (Signed)
Spoke to [place] with [employer], who stated that pt's daughter declined vest due to cost and feeling that pt would not use vest.  Per Melissa, patient assistance was offered and pt's daughter declined that as well.    Will route to Dr. Mortimer Fries to make aware.

## 2019-10-09 NOTE — Telephone Encounter (Signed)
ok 

## 2019-11-02 DIAGNOSIS — N183 Chronic kidney disease, stage 3 unspecified: Secondary | ICD-10-CM | POA: Diagnosis not present

## 2019-11-02 DIAGNOSIS — E1122 Type 2 diabetes mellitus with diabetic chronic kidney disease: Secondary | ICD-10-CM | POA: Diagnosis not present

## 2019-11-02 DIAGNOSIS — Z794 Long term (current) use of insulin: Secondary | ICD-10-CM | POA: Diagnosis not present

## 2019-11-09 DIAGNOSIS — E11649 Type 2 diabetes mellitus with hypoglycemia without coma: Secondary | ICD-10-CM | POA: Diagnosis not present

## 2019-11-09 DIAGNOSIS — E1121 Type 2 diabetes mellitus with diabetic nephropathy: Secondary | ICD-10-CM | POA: Diagnosis not present

## 2019-11-09 DIAGNOSIS — E1165 Type 2 diabetes mellitus with hyperglycemia: Secondary | ICD-10-CM | POA: Diagnosis not present

## 2019-11-09 DIAGNOSIS — R809 Proteinuria, unspecified: Secondary | ICD-10-CM | POA: Diagnosis not present

## 2019-11-09 DIAGNOSIS — E1159 Type 2 diabetes mellitus with other circulatory complications: Secondary | ICD-10-CM | POA: Diagnosis not present

## 2019-11-21 ENCOUNTER — Telehealth: Payer: Self-pay | Admitting: Internal Medicine

## 2019-11-21 MED ORDER — FLUTICASONE-SALMETEROL 250-50 MCG/DOSE IN AEPB: 1.0000 | INHALATION_SPRAY | Freq: Two times a day (BID) | RESPIRATORY_TRACT | 6 refills | Status: DC

## 2019-11-21 NOTE — Telephone Encounter (Signed)
Called and made pts family aware that a rx for advair 250 will be sent to preferred pharmacy. She verbalized her understanding and nothing further needed.

## 2019-11-21 NOTE — Telephone Encounter (Signed)
Called and gave pts daughter the list of recommended inhalers and she will call once she finds which one is covered.

## 2019-11-21 NOTE — Telephone Encounter (Signed)
ADVAIR HFA 230 use as directed

## 2019-11-21 NOTE — Telephone Encounter (Signed)
What does her insurance cover? Dulera, breo, symbicort, flovent,advair?

## 2019-11-21 NOTE — Telephone Encounter (Signed)
Spoke with pts daughter Jones Broom (DPR) and she stated the her mothers endocrinologist stopped the prednisone because it was causing her blood sugars to spike. Now her mother has started coughing again and is not sleeping. They wanted to know if there was a steroid inhaler she could use instead of the prednisone. DK please advise.

## 2019-11-21 NOTE — Telephone Encounter (Signed)
Pts daughter states her insurance will cover any of those inhalers. She said whichever one you recommend to be the best is great.

## 2019-11-29 DIAGNOSIS — E1165 Type 2 diabetes mellitus with hyperglycemia: Secondary | ICD-10-CM | POA: Diagnosis not present

## 2019-11-29 DIAGNOSIS — Z794 Long term (current) use of insulin: Secondary | ICD-10-CM | POA: Diagnosis not present

## 2019-12-03 DIAGNOSIS — E1165 Type 2 diabetes mellitus with hyperglycemia: Secondary | ICD-10-CM | POA: Diagnosis not present

## 2019-12-04 ENCOUNTER — Telehealth: Payer: Self-pay

## 2019-12-11 DIAGNOSIS — E11649 Type 2 diabetes mellitus with hypoglycemia without coma: Secondary | ICD-10-CM | POA: Diagnosis not present

## 2019-12-11 DIAGNOSIS — E1165 Type 2 diabetes mellitus with hyperglycemia: Secondary | ICD-10-CM | POA: Diagnosis not present

## 2019-12-11 DIAGNOSIS — R809 Proteinuria, unspecified: Secondary | ICD-10-CM | POA: Diagnosis not present

## 2019-12-11 DIAGNOSIS — E1121 Type 2 diabetes mellitus with diabetic nephropathy: Secondary | ICD-10-CM | POA: Diagnosis not present

## 2019-12-11 DIAGNOSIS — E1159 Type 2 diabetes mellitus with other circulatory complications: Secondary | ICD-10-CM | POA: Diagnosis not present

## 2019-12-14 DIAGNOSIS — R413 Other amnesia: Secondary | ICD-10-CM | POA: Diagnosis not present

## 2019-12-18 ENCOUNTER — Telehealth: Payer: Self-pay

## 2019-12-18 ENCOUNTER — Ambulatory Visit: Payer: Self-pay

## 2019-12-18 NOTE — Chronic Care Management (AMB) (Signed)
  Chronic Care Management   Outreach Note  12/18/2019 Name: [name] MRN: [id number] DOB: 1929-06-26    Primary Care Provider: Jerrol Banana., MD Reason for referral : Chronic Care Management   An unsuccessful telephone outreach was attempted today. [name] is currently engaged with the Chronic Care Management team. A routine follow-up outreach was attempted today.  A HIPAA compliant voice message was left with her caregiver, Tammy. Requested a return call when they are available.   Follow Up Plan The care management team will attempt to reach out again within the next two weeks.   Horris Latino [facility] Care Management [phone]

## 2019-12-19 ENCOUNTER — Telehealth: Payer: Self-pay

## 2019-12-19 NOTE — Telephone Encounter (Signed)
Contacted patient's daughter [name], no answer LVMTCB.

## 2019-12-19 NOTE — Telephone Encounter (Signed)
Copied from [place] [phone]. Topic: General - Other >> Dec 19, 2019  1:42 PM Rainey Pines A wrote: Patients daughter would like a callback in regards to the name of the blood thinner patient is on. Please advise

## 2019-12-20 NOTE — Telephone Encounter (Signed)
Patient's daughter [name] advised and also would like to know if a note or another form of documentation could be sent over to patient's dentist saying if it is okay for patient to have 2 teeth pulled while she is on a blood thinner. Please advise?

## 2019-12-25 NOTE — Telephone Encounter (Signed)
Okay for note for this with her medications that she is on

## 2019-12-26 NOTE — Telephone Encounter (Signed)
Letter has been written and it is ready for pick up.

## 2020-01-01 ENCOUNTER — Ambulatory Visit (INDEPENDENT_AMBULATORY_CARE_PROVIDER_SITE_OTHER): Payer: Medicare Other

## 2020-01-01 DIAGNOSIS — I10 Essential (primary) hypertension: Secondary | ICD-10-CM | POA: Diagnosis not present

## 2020-01-01 DIAGNOSIS — E1121 Type 2 diabetes mellitus with diabetic nephropathy: Secondary | ICD-10-CM

## 2020-01-01 DIAGNOSIS — Z794 Long term (current) use of insulin: Secondary | ICD-10-CM | POA: Diagnosis not present

## 2020-01-01 NOTE — Chronic Care Management (AMB) (Signed)
Chronic Care Management   Follow Up Note   01/01/2020 Name: [name] MRN: [id number] DOB: 1928-12-12  Primary Care Provider: Jerrol Banana., MD Reason for referral : Chronic Care Management   [name] is a 86 y.o. year old female who is a primary care patient of Tammy Banana., MD. [name] is engaged with the chronic care management team. A routine outreach was conducted today with her daughter [name].   Review of [name]'s status, including review of consultants reports, relevant labs and test results was conducted today. Collaboration with appropriate care team members was performed as part of the comprehensive evaluation and provision of chronic care management services.    Outpatient Encounter Medications as of 01/01/2020  Medication Sig  . albuterol (VENTOLIN HFA) 108 (90 Base) MCG/ACT inhaler INHALE 2 PUFFS INTO THE LUNGS EVERY 4 HOURS AS NEEDED FOR WHEEZING ORSHORTNESS OF BREATH  . Ascorbic Acid (VITAMIN C) 1000 MG tablet Take 1,000 mg by mouth daily.   Marland Kitchen aspirin 325 MG tablet Take 325 mg by mouth daily. Reported on 01/05/2016  . atorvastatin (LIPITOR) 10 MG tablet Take 1 tablet (10 mg total) by mouth daily.  . benzonatate (TESSALON) 100 MG capsule TAKE 1 CAPSULE 3 TIMES DAILY AS NEEDED FOR COUGH  . cetirizine (ZYRTEC) 10 MG tablet TAKE ONE TABLET BY MOUTH EVERY DAY  . cholecalciferol (VITAMIN D3) 25 MCG (1000 UT) tablet Take 1,000 Units by mouth daily.  . clopidogrel (PLAVIX) 75 MG tablet Take 1 tablet (75 mg total) by mouth daily.  Marland Kitchen diltiazem (TIAZAC) 360 MG 24 hr capsule TAKE 1 CAPSULE EVERY DAY  . ezetimibe (ZETIA) 10 MG tablet Take 1 tablet (10 mg total) by mouth daily.  Marland Kitchen FLUoxetine (PROZAC) 40 MG capsule TAKE 1 CAPSULE BY MOUTH EVERY DAY  . Fluticasone-Salmeterol (ADVAIR DISKUS) 250-50 MCG/DOSE AEPB Inhale 1 puff into the lungs 2 (two) times daily.  Marland Kitchen glucose blood test strip ACCU-CHEK AVIVA PLUS TEST STRP Use to check sugars  3 times daily.  . insulin glargine (LANTUS) 100 UNIT/ML injection Inject 0.05 mLs (5 Units total) into the skin at bedtime. (Patient taking differently: Inject 10 Units into the skin at bedtime. )  . insulin lispro (HUMALOG KWIKPEN) 100 UNIT/ML KiwkPen Inject 10 Units into the skin 4 (four) times daily.   . lansoprazole (PREVACID) 30 MG capsule TAKE 1 CAPSULE BY MOUTH DAILY AT NOON  . losartan (COZAAR) 100 MG tablet TAKE ONE TABLET EVERY DAY  . Magnesium 400 MG CAPS Take by mouth daily.  . memantine (NAMENDA) 5 MG tablet TAKE ONE TABLET TWICE DAILY  . montelukast (SINGULAIR) 10 MG tablet TAKE 1 TABLET BY MOUTH AT BEDTIME  . potassium chloride (K-DUR,KLOR-CON) 10 MEQ tablet TAKE ONE TABLET BY MOUTH TWICE DAILY  . predniSONE (DELTASONE) 20 MG tablet Take 1 tablet (20 mg total) by mouth daily.  . predniSONE (DELTASONE) 20 MG tablet Take 1 tablet (20 mg total) by mouth daily with breakfast. 10 days  . predniSONE (DELTASONE) 20 MG tablet Take 1 tablet (20 mg total) by mouth daily with breakfast.  . Respiratory Therapy Supplies (FLUTTER) DEVI Use as directed  . traZODone (DESYREL) 50 MG tablet TAKE 1 AND 1/2 TABLET AT BEDTIME AS NEEDED FOR SLEEP  . TUSSIN DM 100-10 MG/5ML liquid TAKE 1 TEASPOONFUL BY MOUTH EVERY 4 HOURS AS NEEDED FOR COUGH  . vitamin E 1000 UNIT capsule Take 1,000 Units by mouth daily.   No facility-administered encounter medications on file  as of 01/01/2020.     Objective:  BP Readings from Last 3 Encounters:  07/04/19 (!) 152/80  06/20/19 132/76  06/12/19 (!) 181/75   Lab Results  Component Value Date   HGBA1C 7.9 (H) 04/05/2019    Depression screen PHQ 2/9 01/01/2020 09/06/2019 07/26/2019  Decreased Interest 0 0 0  Down, Depressed, Hopeless 0 0 1  PHQ - 2 Score 0 0 1  Altered sleeping - - -  Tired, decreased energy - - -  Change in appetite - - -  Feeling bad or failure about yourself  - - -  Trouble concentrating - - -  Moving slowly or fidgety/restless - - -    Suicidal thoughts - - -  PHQ-9 Score - - -  Difficult doing work/chores - - -    Goals Addressed            This Visit's Progress   . Chronic Disease Management        Current Barriers:  . Chronic Disease Management support and education needs related to Hypertension, Diabetes and COPD.   Case Manager Clinical Goal(s):  Marland Kitchen Over the next 90 days, patient will not be hospitalized for complications related to chronic illnesses. . Over the next 90 days, patient will take all medications as prescribed. (Caregiver to assist) . Over the next 90 days, patient will attend all scheduled medical appointments.(Caregiver to assist) . Over the next 90 days, patient will monitor blood glucose levels daily and record readings. (Caregiver to assist) . Over the next 90 days, patient will monitor blood pressure. (Caregiver to assist) . Over the next 90 days, patient will follow recommended home safety measures to prevent falls.  Interventions:  . Reviewed medications with caregiver/daughter. Discussed indications for use. Daughter encouraged to continue preparing daily medications as prescribed. Encouraged to notify primary care provider or assigned specialist if patient is unable to tolerate the prescribed regimen.Tammy reports that [name] is having a difficult time reading medication labels. Expressed concerns that she may take medications incorrectly if unsupervised. Family continues to assist with medication preparation. Reports contacting insurance agent to discuss options for obtaining a CNA or staff to assist with medications twice a day if needed. . Discussed daily blood glucose ranges. Provided education regarding s/sx of hypoglycemia and hyperglycemia along with recommended treatment interventions. Encouraged patient and daughter to monitor blood glucose daily and maintain a log. Tammy reports that [name] is currently using the Dexcom G6 with occasional fluctuations. . Discussed  blood pressure parameters and indications for notifying MD. Encouraged to monitor at least three times a week if unable to monitor daily. Reports BP remains well controlled. . Reviewed s/sx of complications related to COPD. Discussed indications for seeking immediate medical attention.Tammy reports Ms. Critser has experienced less coughing. Reports symptoms seem well controlled with use of her inhaler. . Provided education regarding home safety and fall prevention. Encouraged to use assistive device when needed and ensure cane and walker are readily available. Tammy reports that Ms. Uffelman continues to ambulate well with assistive devices. They have contacted her insurance provider regarding options for companion care/assistance. Prefers that Ms. Confer receives "drop-in" assistance in the morning and again at night for medication assistance. Reports that she is still able to perform ADL's independently. . Reviewed schedule/provider appointments. Encouraged to attend all provider appointments to prevent delays in care. Pending dental visit for tooth extraction on 01/03/20. Marland Kitchen Discussed plans for ongoing care management follow up. Provided direct contact information for care  management team.   Patient Self Care Activities:  . Performs ADL's independently . Requires assistance with IADLs and medication preparation.   Please see past updates related to this goal by clicking on the "Past Updates" button in the selected goal           PLAN The care management team will follow up with Tammy within the next month to discuss status of in-home services.    Horris Latino [facility] Care Management [phone]

## 2020-01-01 NOTE — Patient Instructions (Addendum)
Thank you for allowing the Chronic Care Management team to participate in your care.  Goals Addressed            This Visit's Progress   . Chronic Disease Management        Current Barriers:  . Chronic Disease Management support and education needs related to Hypertension, Diabetes and COPD.   Case Manager Clinical Goal(s):  Marland Kitchen Over the next 90 days, patient will not be hospitalized for complications related to chronic illnesses. . Over the next 90 days, patient will take all medications as prescribed. (Caregiver to assist) . Over the next 90 days, patient will attend all scheduled medical appointments.(Caregiver to assist) . Over the next 90 days, patient will monitor blood glucose levels daily and record readings. (Caregiver to assist) . Over the next 90 days, patient will monitor blood pressure. (Caregiver to assist) . Over the next 90 days, patient will follow recommended home safety measures to prevent falls.  Interventions:  . Reviewed medications with caregiver/daughter. Discussed indications for use. Daughter encouraged to continue preparing daily medications as prescribed. Encouraged to notify primary care provider or assigned specialist if patient is unable to tolerate the prescribed regimen.Tammy reports that Ms. Mittag is having a difficult time reading medication labels. Expressed concerns that she may take medications incorrectly if unsupervised. Family continues to assist with medication preparation. Reports contacting insurance agent to discuss options for obtaining a CNA or staff to assist with medications twice a day if needed. . Discussed daily blood glucose ranges. Provided education regarding s/sx of hypoglycemia and hyperglycemia along with recommended treatment interventions. Encouraged patient and daughter to monitor blood glucose daily and maintain a log. Tammy reports that Ms. Beals is currently using the Dexcom G6 with occasional fluctuations. . Discussed  blood pressure parameters and indications for notifying MD. Encouraged to monitor at least three times a week if unable to monitor daily. Reports BP remains well controlled. . Reviewed s/sx of complications related to COPD. Discussed indications for seeking immediate medical attention.Tammy reports Ms. Rear has experienced less coughing. Reports symptoms seem well controlled with use of her inhaler. . Provided education regarding home safety and fall prevention. Encouraged to use assistive device when needed and ensure cane and walker are readily available. Tammy reports that Ms. Mateen continues to ambulate well with assistive devices. They have contacted her insurance provider regarding options for companion care/assistance. Prefers that Ms. Willingham receives "drop-in" assistance in the morning and again at night for medication assistance. Reports that she is still able to perform ADL's independently. . Reviewed schedule/provider appointments. Encouraged to attend all provider appointments to prevent delays in care. Pending dental visit for tooth extraction on 01/03/20. Marland Kitchen Discussed plans for ongoing care management follow up. Provided direct contact information for care management team.   Patient Self Care Activities:  . Performs ADL's independently . Requires assistance with IADLs and medication preparation.   Please see past updates related to this goal by clicking on the "Past Updates" button in the selected goal         Ms. Suchan's daughter, [name], verbalized understanding of the instructions provided during the telephonic outreach today. Declined need for a mailed/printed copy of the instructions. The team will send additional information regarding options for in-home assistance if needed.  The care management team will follow-up with Ms. Craigie's daughter, [name] within the next month to discuss status of services.   [facility] Latino [facility] Care  Management [phone]

## 2020-01-03 ENCOUNTER — Telehealth: Payer: Self-pay

## 2020-01-09 ENCOUNTER — Other Ambulatory Visit: Payer: Self-pay | Admitting: Family Medicine

## 2020-01-09 DIAGNOSIS — E876 Hypokalemia: Secondary | ICD-10-CM

## 2020-01-10 ENCOUNTER — Other Ambulatory Visit: Payer: Self-pay

## 2020-01-10 ENCOUNTER — Ambulatory Visit (INDEPENDENT_AMBULATORY_CARE_PROVIDER_SITE_OTHER): Payer: Medicare Other | Admitting: Physician Assistant

## 2020-01-10 ENCOUNTER — Encounter: Payer: Self-pay | Admitting: Physician Assistant

## 2020-01-10 VITALS — BP 126/78 | HR 86 | Temp 96.9°F | Wt 148.4 lb

## 2020-01-10 DIAGNOSIS — S81812A Laceration without foreign body, left lower leg, initial encounter: Secondary | ICD-10-CM | POA: Diagnosis not present

## 2020-01-10 DIAGNOSIS — Z7902 Long term (current) use of antithrombotics/antiplatelets: Secondary | ICD-10-CM | POA: Diagnosis not present

## 2020-01-10 NOTE — Progress Notes (Signed)
Established patient visit     Patient: [name]   DOB: 09-22-1929   86 y.o. Female  MRN: [id number] Visit Date: 01/10/2020  Today's healthcare provider: Trinna Post, PA-C  Subjective:    Chief Complaint  Patient presents with  . Skin tear   Mertie Moores as a scribe for Trinna Post, PA-C.,have documented all relevant documentation on the behalf of Trinna Post, PA-C,as directed by  Trinna Post, PA-C while in the presence of Trinna Post, PA-C.  Skin Tear:  Incident onset: 2 days ago. The abrasion is located on the left leg. The pain is moderate. The pain is described as throbbing.The pain has been constant since onset. She reports no foreign bodies present. Patient denies hitting her head or losing consciousness. She is not having any pain anywhere else in her body. Fall happened after taking Tylenol 3 after a dental procedure.  Her tetanus status is UTD.   Last TD: 06/27/2018     Medications: Outpatient Medications Prior to Visit  Medication Sig  . albuterol (VENTOLIN HFA) 108 (90 Base) MCG/ACT inhaler INHALE 2 PUFFS INTO THE LUNGS EVERY 4 HOURS AS NEEDED FOR WHEEZING ORSHORTNESS OF BREATH  . Ascorbic Acid (VITAMIN C) 1000 MG tablet Take 1,000 mg by mouth daily.   Marland Kitchen aspirin 325 MG tablet Take 325 mg by mouth daily. Reported on 01/05/2016  . atorvastatin (LIPITOR) 10 MG tablet Take 1 tablet (10 mg total) by mouth daily.  . cetirizine (ZYRTEC) 10 MG tablet TAKE ONE TABLET BY MOUTH EVERY DAY  . cholecalciferol (VITAMIN D3) 25 MCG (1000 UT) tablet Take 1,000 Units by mouth daily.  . clopidogrel (PLAVIX) 75 MG tablet Take 1 tablet (75 mg total) by mouth daily.  Marland Kitchen diltiazem (TIAZAC) 360 MG 24 hr capsule TAKE 1 CAPSULE EVERY DAY  . ezetimibe (ZETIA) 10 MG tablet Take 1 tablet (10 mg total) by mouth daily.  Marland Kitchen FLUoxetine (PROZAC) 40 MG capsule TAKE 1 CAPSULE BY MOUTH EVERY DAY  . Fluticasone-Salmeterol (ADVAIR DISKUS) 250-50 MCG/DOSE AEPB  Inhale 1 puff into the lungs 2 (two) times daily.  Marland Kitchen glucose blood test strip ACCU-CHEK AVIVA PLUS TEST STRP Use to check sugars 3 times daily.  . insulin glargine (LANTUS) 100 UNIT/ML injection Inject 0.05 mLs (5 Units total) into the skin at bedtime. (Patient taking differently: Inject 10 Units into the skin at bedtime. )  . insulin lispro (HUMALOG KWIKPEN) 100 UNIT/ML KiwkPen Inject 10 Units into the skin 4 (four) times daily.   . lansoprazole (PREVACID) 30 MG capsule TAKE 1 CAPSULE BY MOUTH DAILY AT NOON  . losartan (COZAAR) 100 MG tablet TAKE ONE TABLET EVERY DAY  . Magnesium 400 MG CAPS Take by mouth daily.  . memantine (NAMENDA) 5 MG tablet TAKE ONE TABLET TWICE DAILY  . montelukast (SINGULAIR) 10 MG tablet TAKE 1 TABLET BY MOUTH AT BEDTIME  . potassium chloride (KLOR-CON) 10 MEQ tablet TAKE 1 TABLET BY MOUTH TWICE DAILY  . predniSONE (DELTASONE) 20 MG tablet Take 1 tablet (20 mg total) by mouth daily with breakfast.  . Respiratory Therapy Supplies (FLUTTER) DEVI Use as directed  . traZODone (DESYREL) 50 MG tablet TAKE 1 AND 1/2 TABLET AT BEDTIME AS NEEDED FOR SLEEP  . TUSSIN DM 100-10 MG/5ML liquid TAKE 1 TEASPOONFUL BY MOUTH EVERY 4 HOURS AS NEEDED FOR COUGH  . vitamin E 1000 UNIT capsule Take 1,000 Units by mouth daily.  . [DISCONTINUED] predniSONE (DELTASONE) 20 MG tablet Take 1  tablet (20 mg total) by mouth daily.  . [DISCONTINUED] benzonatate (TESSALON) 100 MG capsule TAKE 1 CAPSULE 3 TIMES DAILY AS NEEDED FOR COUGH  . [DISCONTINUED] predniSONE (DELTASONE) 20 MG tablet Take 1 tablet (20 mg total) by mouth daily with breakfast. 10 days   No facility-administered medications prior to visit.    Review of Systems  Constitutional: Negative for appetite change, chills, fatigue and fever.  Respiratory: Negative for chest tightness and shortness of breath.   Cardiovascular: Negative for chest pain and palpitations.  Gastrointestinal: Negative for abdominal pain, nausea and vomiting.    Neurological: Negative for dizziness and weakness.        Objective:    BP 126/78 (BP Location: Right Arm, Patient Position: Sitting, Cuff Size: Normal)   Pulse 86   Temp (!) 96.9 F (36.1 C) (Temporal)   Wt 148 lb 6.4 oz (67.3 kg)   BMI 27.14 kg/m    Physical Exam Constitutional:      Appearance: Normal appearance.  Skin:    General: Skin is warm and dry.     Findings: Wound present.          Comments: See photo   Neurological:     Mental Status: She is alert. Mental status is at baseline.  Psychiatric:        Mood and Affect: Mood normal.        Behavior: Behavior normal.     Media Information   Document Information  Photos  Left skin tear   01/10/2020 13:46  Attached To:  Office Visit on 01/10/20 with Trinna Post, PA-C  Source Information  Trinna Post, PA-C  [facility]     No results found for any visits on 01/10/20.    Assessment & Plan:  1. Noninfected skin tear of left lower extremity, initial encounter Wound must heal on its own. Tetanus UTD. Does not appear infected today. Counseled it will take a long time to heal. Counseled on signs of infection. Instructed to hold tylenol 3 and just take tylenol for pain relief.   2. Platelet inhibition due to Plavix     Return if symptoms worsen or fail to improve.        Paulene Floor  [facility] [phone] (phone) [phone] (fax)  [place]

## 2020-01-10 NOTE — Patient Instructions (Signed)
Skin Tear A skin tear is a wound in which the top layers of skin peel off. This is a common problem for older people. It can also be a problem for people who take certain medicines for too long. To repair the skin, your doctor may use:  Tape.  Skin tape (adhesive) strips. A bandage (dressing) may also be placed over the tape or skin tape strips. Follow these instructions at home: Wound care   Clean the wound as told by your doctor. You may be told to keep the wound dry for the first few days. If you are told to clean the wound: ? Wash the wound with mild soap and water, a wound cleanser, or a salt-water (saline) solution. ? If you use soap, rinse the wound with water to remove all soap. ? Do not rub the wound dry. Pat the wound gently, or let it air dry. ? Keep the bandage dry as told by your doctor.  Change any bandage as told by your doctor. This includes changing the bandage if it gets wet, gets dirty, or starts to smell bad. To change your bandage: ? Wash your hands with soap and water before and after you change your bandage. If you cannot use soap and water, use hand sanitizer. ? Leave tape or skin tape strips in place. They may need to stay in place for 2 weeks or longer. If tape strips get loose and curl up, you may trim the loose edges. Do not remove tape strips completely unless your doctor says it is okay.  Check your wound every day for signs of infection. Check for: ? Redness, swelling, or pain. ? More fluid or blood. ? Warmth. ? Pus or a bad smell.  Do not scratch or pick at the wound.  Protect the injured area until it has healed. Medicines  Take or apply over-the-counter and prescription medicines only as told by your doctor.  If you were prescribed an antibiotic medicine, take or apply it as told by your doctor. Do not stop using the antibiotic even if your condition gets better. General instructions   Keep the bandage dry as told by your doctor.  Do not take  baths, swim, use a hot tub, or do anything that puts your wound underwater until your doctor approves. Ask your doctor if you may take showers. You may only be allowed to take sponge baths.  Keep all follow-up visits as told by your doctor. This is important. Contact a doctor if:  You have redness, swelling, or pain around your wound.  You have more fluid or blood coming from your wound.  Your wound feels warm to the touch.  You have pus or a bad smell coming from your wound. Get help right away if:  You have a red streak that goes away from the skin tear.  You have a fever and chills, and your symptoms get worse all of a sudden. Summary  A skin tear is a wound in which the top layers of skin peel off.  To repair the skin, your doctor may use tape or skin tape strips.  Change any bandage as told by your doctor.  Take or apply over-the-counter and prescription medicines only as told by your doctor.  Contact a doctor if you have signs of infection. This information is not intended to replace advice given to you by your health care provider. Make sure you discuss any questions you have with your health care provider. Document Revised: 01/04/2019  Document Reviewed: 07/04/2018 Elsevier Patient Education  [employer].

## 2020-01-14 ENCOUNTER — Ambulatory Visit: Payer: Self-pay | Admitting: Dermatology

## 2020-01-14 DIAGNOSIS — E1165 Type 2 diabetes mellitus with hyperglycemia: Secondary | ICD-10-CM | POA: Diagnosis not present

## 2020-01-14 DIAGNOSIS — Z794 Long term (current) use of insulin: Secondary | ICD-10-CM | POA: Diagnosis not present

## 2020-01-15 NOTE — Chronic Care Management (AMB) (Signed)
Chronic Care Management   Follow Up Note    Name: [name] MRN: [id number] DOB: 1929/07/05  Primary Care Provider: Jerrol Hall., MD Reason for referral : Chronic Care Management   [name] is a 86 y.o. year old female who is a primary care patient of [name]., MD. She is currently engaged with the chronic care management team. A routine telephonic outreach was conducted with her daughter [name] today.  Review of [name]'s status, including review of consultants reports, relevant labs and test results was conducted today. Collaboration with appropriate care team members was performed as part of the comprehensive evaluation and provision of chronic care management services.      Outpatient Encounter Medications as of 09/06/2019  Medication Sig  . albuterol (VENTOLIN HFA) 108 (90 Base) MCG/ACT inhaler INHALE 2 PUFFS INTO THE LUNGS EVERY 4 HOURS AS NEEDED FOR WHEEZING ORSHORTNESS OF BREATH  . Ascorbic Acid (VITAMIN C) 1000 MG tablet Take 1,000 mg by mouth daily.   Marland Kitchen aspirin 325 MG tablet Take 325 mg by mouth daily. Reported on 01/05/2016  . atorvastatin (LIPITOR) 10 MG tablet Take 1 tablet (10 mg total) by mouth daily.  . cetirizine (ZYRTEC) 10 MG tablet TAKE ONE TABLET BY MOUTH EVERY DAY  . cholecalciferol (VITAMIN D3) 25 MCG (1000 UT) tablet Take 1,000 Units by mouth daily.  . clopidogrel (PLAVIX) 75 MG tablet Take 1 tablet (75 mg total) by mouth daily.  Marland Kitchen ezetimibe (ZETIA) 10 MG tablet Take 1 tablet (10 mg total) by mouth daily.  Marland Kitchen glucose blood test strip ACCU-CHEK AVIVA PLUS TEST STRP Use to check sugars 3 times daily.  . insulin glargine (LANTUS) 100 UNIT/ML injection Inject 0.05 mLs (5 Units total) into the skin at bedtime. (Patient taking differently: Inject 10 Units into the skin at bedtime. )  . insulin lispro (HUMALOG KWIKPEN) 100 UNIT/ML KiwkPen Inject 10 Units into the skin 4 (four) times daily.   . lansoprazole (PREVACID) 30  MG capsule TAKE 1 CAPSULE BY MOUTH DAILY AT NOON  . Magnesium 400 MG CAPS Take by mouth daily.  . montelukast (SINGULAIR) 10 MG tablet TAKE 1 TABLET BY MOUTH AT BEDTIME  . Respiratory Therapy Supplies (FLUTTER) DEVI Use as directed  . TUSSIN DM 100-10 MG/5ML liquid TAKE 1 TEASPOONFUL BY MOUTH EVERY 4 HOURS AS NEEDED FOR COUGH  . vitamin E 1000 UNIT capsule Take 1,000 Units by mouth daily.  . [DISCONTINUED] benzonatate (TESSALON) 100 MG capsule TAKE 1 CAPSULE 3 TIMES DAILY AS NEEDED FOR COUGH  . [DISCONTINUED] diltiazem (TIAZAC) 360 MG 24 hr capsule TAKE 1 CAPSULE BY MOUTH EVERY DAY  . [DISCONTINUED] FLUoxetine (PROZAC) 40 MG capsule Take 1 capsule (40 mg total) by mouth daily.  . [DISCONTINUED] losartan (COZAAR) 100 MG tablet TAKE ONE TABLET BY MOUTH EVERY DAY  . [DISCONTINUED] memantine (NAMENDA) 5 MG tablet TAKE ONE TABLET TWICE DAILY  . [DISCONTINUED] potassium chloride (K-DUR,KLOR-CON) 10 MEQ tablet TAKE ONE TABLET BY MOUTH TWICE DAILY  . [DISCONTINUED] predniSONE (DELTASONE) 20 MG tablet Take 1 tablet (20 mg total) by mouth daily.  . [DISCONTINUED] predniSONE (DELTASONE) 20 MG tablet Take 1 tablet (20 mg total) by mouth daily with breakfast. 10 days  . [DISCONTINUED] traZODone (DESYREL) 50 MG tablet TAKE 1 AND 1/2 TABLET AT BEDTIME AS NEEDED FOR SLEEP   No facility-administered encounter medications on file as of 09/06/2019.       Goals Addressed  This Visit's Progress   . Chronic Disease Management        Current Barriers:  . Chronic Disease Management support and education needs related to Hypertension, Diabetes and COPD.   Case Manager Clinical Goal(s):  Marland Kitchen Over the next 90 days, patient will not be hospitalized for complications related to chronic illnesses. . Over the next 90 days, patient will take all medications as prescribed. (Caregiver to assist) . Over the next 90 days, patient will attend all scheduled medical appointments.(Caregiver to assist) . Over the  next 90 days, patient will monitor blood glucose levels daily and record readings. (Caregiver to assist) . Over the next 90 days, patient will monitor blood pressure. (Caregiver to assist) . Over the next 90 days, patient will follow recommended home safety measures to prevent falls.  Interventions:  . Reviewed medications with caregiver/daughter. Discussed indications for use. Daughter encouraged to continue preparing daily medications as prescribed. Encouraged to notify primary care provider or assigned specialist if patient is unable to tolerate the prescribed regimen. . Discussed daily blood glucose ranges. Provided education regarding s/sx of hypoglycemia and hyperglycemia along with recommended treatment interventions. Encouraged patient and daughter to monitor blood glucose daily and maintain a log. Per [name], readings have been within range. . Discussed blood pressure parameters and indications for notifying MD. Encouraged to monitor at least three times a week if unable to monitor daily. Per [name], blood pressure is well controlled and readings have been within range. . Reviewed s/sx of complications related to COPD. Discussed indications for seeking immediate medical attention. [name] reports that a vest was ordered by Pulmonologist due to [name]'s chronic cough. . Provided education regarding home safety and fall prevention. Encouraged to use assistive device when needed and ensure cane and walker are readily available. [name] reports that [name] continues to use her walker or cane when ambulating. Considering companion care to assist during the day. . Reviewed schedule/provider appointments. Encouraged to attend all provider appointments to prevent delays in care.  . Discussed plans for ongoing care management follow up. Provided direct contact information for care management team.   Patient Self Care Activities:  . Performs ADL's independently . Requires assistance with IADLs  and medication preparation.   Please see past updates related to this goal by clicking on the "Past Updates" button in the selected goal         PLAN The care management team will reach out to Ms. Alcantar's daughter [name] within the next two to three months. [name] was encouraged to contact team with any urgent or health related concerns if needed prior to the next outreach.    [facility] Care Management [phone]

## 2020-01-15 NOTE — Patient Instructions (Addendum)
Thank you for allowing the Chronic Care Management team to participate in your care.   Goals Addressed            This Visit's Progress   . Chronic Disease Management        Current Barriers:  . Chronic Disease Management support and education needs related to Hypertension, Diabetes and COPD.   Case Manager Clinical Goal(s):  Marland Kitchen Over the next 90 days, patient will not be hospitalized for complications related to chronic illnesses. . Over the next 90 days, patient will take all medications as prescribed. (Caregiver to assist) . Over the next 90 days, patient will attend all scheduled medical appointments.(Caregiver to assist) . Over the next 90 days, patient will monitor blood glucose levels daily and record readings. (Caregiver to assist) . Over the next 90 days, patient will monitor blood pressure. (Caregiver to assist) . Over the next 90 days, patient will follow recommended home safety measures to prevent falls.  Interventions:  . Reviewed medications with caregiver/daughter. Discussed indications for use. Daughter encouraged to continue preparing daily medications as prescribed. Encouraged to notify primary care provider or assigned specialist if patient is unable to tolerate the prescribed regimen. . Discussed daily blood glucose ranges. Provided education regarding s/sx of hypoglycemia and hyperglycemia along with recommended treatment interventions. Encouraged patient and daughter to monitor blood glucose daily and maintain a log. Per Tammy, readings have been within range. . Discussed blood pressure parameters and indications for notifying MD. Encouraged to monitor at least three times a week if unable to monitor daily. Per Tammy, blood pressure is well controlled and readings have been within range. . Reviewed s/sx of complications related to COPD. Discussed indications for seeking immediate medical attention. Tammy reports that a vest was ordered by Pulmonologist due to Ms.  Alberg's chronic cough. . Provided education regarding home safety and fall prevention. Encouraged to use assistive device when needed and ensure cane and walker are readily available. Tammy reports that Ms. Garciaramirez continues to use her walker or cane when ambulating. Considering companion care to assist during the day. . Reviewed schedule/provider appointments. Encouraged to attend all provider appointments to prevent delays in care.  . Discussed plans for ongoing care management follow up. Provided direct contact information for care management team.   Patient Self Care Activities:  . Performs ADL's independently . Requires assistance with IADLs and medication preparation.   Please see past updates related to this goal by clicking on the "Past Updates" button in the selected goal          A telephonic outreach was conducted today with Ms. Coy's daughter [name]. She verbalized understanding of the instructions discussed. Declined need for a printed/mailed copy of the instructions.   The care management team will reach out to Ms. Janowicz's daughter [name] within the next two to three months. Tammy was encouraged to contact team with any urgent or health related concerns if needed prior to the next outreach.   [facility] Care Management [phone]

## 2020-01-18 ENCOUNTER — Encounter: Payer: Self-pay | Admitting: Emergency Medicine

## 2020-01-18 ENCOUNTER — Emergency Department: Payer: Medicare Other

## 2020-01-18 ENCOUNTER — Other Ambulatory Visit: Payer: Self-pay

## 2020-01-18 DIAGNOSIS — Z794 Long term (current) use of insulin: Secondary | ICD-10-CM | POA: Diagnosis not present

## 2020-01-18 DIAGNOSIS — Z79899 Other long term (current) drug therapy: Secondary | ICD-10-CM | POA: Diagnosis not present

## 2020-01-18 DIAGNOSIS — J449 Chronic obstructive pulmonary disease, unspecified: Secondary | ICD-10-CM | POA: Insufficient documentation

## 2020-01-18 DIAGNOSIS — N189 Chronic kidney disease, unspecified: Secondary | ICD-10-CM | POA: Insufficient documentation

## 2020-01-18 DIAGNOSIS — I209 Angina pectoris, unspecified: Secondary | ICD-10-CM | POA: Diagnosis not present

## 2020-01-18 DIAGNOSIS — Z7902 Long term (current) use of antithrombotics/antiplatelets: Secondary | ICD-10-CM | POA: Diagnosis not present

## 2020-01-18 DIAGNOSIS — I480 Paroxysmal atrial fibrillation: Secondary | ICD-10-CM | POA: Insufficient documentation

## 2020-01-18 DIAGNOSIS — I129 Hypertensive chronic kidney disease with stage 1 through stage 4 chronic kidney disease, or unspecified chronic kidney disease: Secondary | ICD-10-CM | POA: Insufficient documentation

## 2020-01-18 DIAGNOSIS — E1122 Type 2 diabetes mellitus with diabetic chronic kidney disease: Secondary | ICD-10-CM | POA: Insufficient documentation

## 2020-01-18 DIAGNOSIS — R0789 Other chest pain: Secondary | ICD-10-CM | POA: Diagnosis not present

## 2020-01-18 DIAGNOSIS — R079 Chest pain, unspecified: Secondary | ICD-10-CM | POA: Diagnosis not present

## 2020-01-18 LAB — BASIC METABOLIC PANEL
Anion gap: 10 (ref 5–15)
BUN: 16 mg/dL (ref 8–23)
CO2: 23 mmol/L (ref 22–32)
Calcium: 9.1 mg/dL (ref 8.9–10.3)
Chloride: 100 mmol/L (ref 98–111)
Creatinine, Ser: 1.31 mg/dL — ABNORMAL HIGH (ref 0.44–1.00)
GFR calc Af Amer: 42 mL/min — ABNORMAL LOW (ref >60)
GFR calc non Af Amer: 36 mL/min — ABNORMAL LOW (ref >60)
Glucose, Bld: 351 mg/dL — ABNORMAL HIGH (ref 70–99)
Potassium: 4.5 mmol/L (ref 3.5–5.1)
Sodium: 133 mmol/L — ABNORMAL LOW (ref 135–145)

## 2020-01-18 LAB — CBC
HCT: 33.9 % — ABNORMAL LOW (ref 36.0–46.0)
Hemoglobin: 10.7 g/dL — ABNORMAL LOW (ref 12.0–15.0)
MCH: 28.9 pg (ref 26.0–34.0)
MCHC: 31.6 g/dL (ref 30.0–36.0)
MCV: 91.6 fL (ref 80.0–100.0)
Platelets: 295 10*3/uL (ref 150–400)
RBC: 3.7 MIL/uL — ABNORMAL LOW (ref 3.87–5.11)
RDW: 13.5 % (ref 11.5–15.5)
WBC: 8.8 10*3/uL (ref 4.0–10.5)
nRBC: 0 % (ref 0.0–0.2)

## 2020-01-18 LAB — TROPONIN I (HIGH SENSITIVITY): Troponin I (High Sensitivity): 8 ng/L (ref <18)

## 2020-01-18 NOTE — ED Triage Notes (Signed)
Patient states that she has been feeling dizzy all day. Patient states that about 19:00 this evening that she started having central chest pain radiating down her left arm.

## 2020-01-19 ENCOUNTER — Emergency Department
Admission: EM | Admit: 2020-01-19 | Discharge: 2020-01-19 | Disposition: A | Discharge status: Home / Self Care | Payer: Medicare Other | Attending: Emergency Medicine | Admitting: Emergency Medicine

## 2020-01-19 DIAGNOSIS — R079 Chest pain, unspecified: Secondary | ICD-10-CM

## 2020-01-19 LAB — TROPONIN I (HIGH SENSITIVITY): Troponin I (High Sensitivity): 13 ng/L (ref <18)

## 2020-01-19 MED ORDER — MUCINEX 600 MG PO TB12: 600.0000 mg | ORAL_TABLET | Freq: Two times a day (BID) | ORAL | 0 refills | Status: AC | PRN

## 2020-01-19 NOTE — ED Provider Notes (Signed)
[name] Provider Note  Time seen: 12:41 AM  I have reviewed the triage vital signs and the nursing notes.   HISTORY  Chief Complaint Chest Pain   HPI KALONI BADOLATO is a 86 y.o. female with a past medical history of COPD, diabetes, hypertension, hyperlipidemia, presents to the emergency department for chest pain.  According to the [name] patient has been more active today, was complaining of some chest discomfort tonight.  [name] states the patient did recently have a fall which could be contributing to her pain.  Just started prednisone yesterday as well for slight increase in cough/COPD.  Denies any chest pain currently.  No shortness of breath.  No fever.   Past Medical History:  Diagnosis Date  . Angina pectoris ([place])   . Atrial fibrillation ([place])   . Basal cell carcinoma    nose  . Cervicalgia   . Chronic back pain   . Chronic obstructive pulmonary disease (COPD) ([place])   . COPD (chronic obstructive pulmonary disease) ([place])   . DDD (degenerative disc disease), lumbar   . Diabetes mellitus without complication ([place])   . Dysphagia   . Hyperlipemia   . Hypertension   . Squamous cell carcinoma of skin    left lateral pretibial  . Vulvovaginitis     Patient Active Problem List   Diagnosis Date Noted  . Celiac artery stenosis ([place]) 06/12/2019  . Bilateral lower abdominal cramping 04/20/2019  . UTI symptoms 04/20/2019  . History of rectal bleeding 03/26/2019  . DM type 2 with diabetic peripheral neuropathy ([place]) 12/14/2018  . Arthropathy of lumbar facet joint 09/04/2018  . Degeneration of lumbar intervertebral disc 09/04/2018  . Spondylolisthesis, grade 1 09/04/2018  . Lewy body dementia ([place]) 02/17/2018  . PVD (peripheral vascular disease) ([place]) 08/16/2017  . Pain in limb 07/27/2016  . Hallucinations 07/07/2016  . Chest pain 07/01/2016  . Depression 06/23/2016  . Hypokalemia 09/03/2015  . Insomnia 07/23/2015   . Headache 07/23/2015  . Type 2 diabetes mellitus with vascular disease ([place]) 06/24/2015  . Chronic vulvitis 06/02/2015  . Allergic rhinitis 05/30/2015  . Back ache 05/30/2015  . Body mass index (BMI) of 29.0-29.9 in adult 05/30/2015  . Edema 05/30/2015  . Dilatation of esophagus 05/30/2015  . Fall 05/30/2015  . H/O deep venous thrombosis 05/30/2015  . Hypercholesteremia 05/30/2015  . Heart & renal disease, hypertensive, with heart failure ([place]) 05/30/2015  . Below normal amount of sodium in the blood 05/30/2015  . Calculus of kidney 05/30/2015  . Gonalgia 05/30/2015  . Psoriasis 05/30/2015  . Avitaminosis D 05/30/2015  . Cough 04/01/2015  . Combined fat and carbohydrate induced hyperlipemia 12/30/2014  . Paroxysmal atrial fibrillation ([place]) 07/10/2014  . Abnormal gait 07/08/2014  . Anxiety state 07/08/2014  . Chronic kidney disease 07/08/2014  . Acid reflux 07/08/2014  . 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[place]) 07/08/2014  . Chronic obstructive pulmonary disease ([place]) 07/08/2014  . Type II diabetes mellitus with renal manifestations ([place]) 07/08/2014  . Benign essential HTN 06/26/2014  . Carotid artery narrowing 06/26/2014  . Myalgia 06/26/2014  . Basal cell carcinoma of face 05/01/2014  . Disease of female genital organs 11/28/2012  . Incomplete bladder emptying 09/05/2012  . Excessive urination at night 08/15/2012  . Basal cell carcinoma of ear 10/26/2011    Past Surgical History:  Procedure Laterality Date  . gall bladder    . melanoma     removal neck and back  . PERIPHERAL VASCULAR CATHETERIZATION Left  01/05/2016   Procedure: Lower Extremity Angiography;  Surgeon: Algernon Huxley, MD;  Location: [facility];  Service: Cardiovascular;  Laterality: Left;  . PERIPHERAL VASCULAR CATHETERIZATION  01/05/2016   Procedure: Lower Extremity Intervention;  Surgeon: Algernon Huxley, MD;  Location: [facility];  Service: Cardiovascular;;  . ureterolithiasis     calculus  removed  . VAGINAL HYSTERECTOMY    . VISCERAL ANGIOGRAPHY N/A 05/10/2019   Procedure: VISCERAL ANGIOGRAPHY;  Surgeon: Algernon Huxley, MD;  Location: [facility];  Service: Cardiovascular;  Laterality: N/A;  . VULVA / PERINEUM BIOPSY  05/29/2015    Prior to Admission medications   Medication Sig Start Date End Date Taking? Authorizing Provider  albuterol (VENTOLIN HFA) 108 (90 Base) MCG/ACT inhaler INHALE 2 PUFFS INTO THE LUNGS EVERY 4 HOURS AS NEEDED FOR WHEEZING ORSHORTNESS OF BREATH 07/04/19   Flora Lipps, MD  Ascorbic Acid (VITAMIN C) 1000 MG tablet Take 1,000 mg by mouth daily.     [provider]  aspirin 325 MG tablet Take 325 mg by mouth daily. Reported on 01/05/2016    [provider]  atorvastatin (LIPITOR) 10 MG tablet Take 1 tablet (10 mg total) by mouth daily. 06/20/19 06/19/20  Jerrol Banana., MD  cetirizine (ZYRTEC) 10 MG tablet TAKE ONE TABLET BY MOUTH EVERY DAY 12/12/18   Jerrol Banana., MD  cholecalciferol (VITAMIN D3) 25 MCG (1000 UT) tablet Take 1,000 Units by mouth daily.    [provider]  clopidogrel (PLAVIX) 75 MG tablet Take 1 tablet (75 mg total) by mouth daily. 05/10/19   Algernon Huxley, MD  diltiazem [facility]) 360 MG 24 hr capsule TAKE 1 CAPSULE EVERY DAY 10/03/19   Jerrol Banana., MD  ezetimibe (ZETIA) 10 MG tablet Take 1 tablet (10 mg total) by mouth daily. 04/06/19   Jerrol Banana., MD  FLUoxetine (PROZAC) 40 MG capsule TAKE 1 CAPSULE BY MOUTH EVERY DAY 10/03/19   Jerrol Banana., MD  Fluticasone-Salmeterol (ADVAIR DISKUS) 250-50 MCG/DOSE AEPB Inhale 1 puff into the lungs 2 (two) times daily. 11/21/19 11/20/20  Flora Lipps, MD  glucose blood test strip ACCU-CHEK AVIVA PLUS TEST STRP Use to check sugars 3 times daily. 10/28/15   [provider]  insulin glargine (LANTUS) 100 UNIT/ML injection Inject 0.05 mLs (5 Units total) into the skin at bedtime. Patient taking differently: Inject 10 Units into the  skin at bedtime.  07/02/16   Epifanio Lesches, MD  insulin lispro (HUMALOG KWIKPEN) 100 UNIT/ML KiwkPen Inject 10 Units into the skin 4 (four) times daily.     [provider]  lansoprazole (PREVACID) 30 MG capsule TAKE 1 CAPSULE BY MOUTH DAILY AT NOON 08/13/19   Jerrol Banana., MD  losartan (COZAAR) 100 MG tablet TAKE ONE TABLET EVERY DAY 09/17/19   Jerrol Banana., MD  Magnesium 400 MG CAPS Take by mouth daily.    [provider]  memantine (NAMENDA) 5 MG tablet TAKE ONE TABLET TWICE DAILY 09/29/19   Jerrol Banana., MD  montelukast (SINGULAIR) 10 MG tablet TAKE 1 TABLET BY MOUTH AT BEDTIME 06/06/19   Jerrol Banana., MD  potassium chloride (KLOR-CON) 10 MEQ tablet TAKE 1 TABLET BY MOUTH TWICE DAILY 01/09/20   Jerrol Banana., MD  predniSONE (DELTASONE) 20 MG tablet Take 1 tablet (20 mg total) by mouth daily with breakfast. 10/01/19   Flora Lipps, MD  Respiratory Therapy  Supplies (FLUTTER) DEVI Use as directed 07/04/19   Flora Lipps, MD  traZODone (DESYREL) 50 MG tablet TAKE 1 AND 1/2 TABLET AT BEDTIME AS NEEDED FOR SLEEP 09/29/19   Jerrol Banana., MD  TUSSIN DM 100-10 MG/5ML liquid TAKE 1 TEASPOONFUL BY MOUTH EVERY 4 HOURS AS NEEDED FOR COUGH 08/20/19   Flora Lipps, MD  vitamin E 1000 UNIT capsule Take 1,000 Units by mouth daily.    [provider]    Allergies  Allergen Reactions  . Bacitracin-Neomycin-Polymyxin Rash  . Neomycin-Bacitracin Zn-Polymyx Swelling and Rash  . Latex Rash  . Lidocaine Rash  . Aricept [Donepezil Hcl] Diarrhea  . Benzalkonium Chloride Itching and Swelling  . Ibuprofen     Other reaction(s): Dizziness Heart fluttering. Tachycardia. Tachycardia.  . Valdecoxib Nausea And Vomiting  . Albuterol Rash  . Tape Rash    blisters  . Triamcinolone Rash    Family History  Problem Relation Age of Onset  . Ovarian cancer Other   . Diabetes Sister   . Colon cancer Brother   . Diabetes Brother   .  Atrial fibrillation [name]   . Breast cancer Neg Hx     Social History Social History   Tobacco Use  . Smoking status: Never Smoker  . Smokeless tobacco: Never Used  Substance Use Topics  . Alcohol use: No  . Drug use: No    Review of Systems Constitutional: Negative for fever. Cardiovascular: Chest pain earlier, now resolved Respiratory: Negative for shortness of breath. Gastrointestinal: Negative for abdominal pain Musculoskeletal: Negative for musculoskeletal complaints Skin: Negative for skin complaints  Neurological: Negative for headache All other ROS negative  ____________________________________________   PHYSICAL EXAM:  VITAL SIGNS: ED Triage Vitals  Enc Vitals Group     BP 01/18/20 2108 (!) 172/94     Pulse Rate 01/18/20 2108 99     Resp 01/18/20 2108 18     Temp 01/18/20 2108 98.6 F (37 C)     Temp Source 01/18/20 2108 Oral     SpO2 01/18/20 2108 97 %     Weight 01/18/20 2105 140 lb (63.5 kg)     Height 01/18/20 2105 5\' 3"  (1.6 m)     Head Circumference --      Peak Flow --      Pain Score 01/18/20 2105 6     Pain Loc --      Pain Edu? --      Excl. in [place]? --     Constitutional: Alert. Well appearing and in no distress. Eyes: Normal exam ENT      Head: Normocephalic and atraumatic.      Mouth/Throat: Mucous membranes are moist. Cardiovascular: Normal rate, regular rhythm. No murmur Respiratory: Normal respiratory effort without tachypnea nor retractions. Breath sounds are clear  Gastrointestinal: Soft and nontender. No distention.   Musculoskeletal: Nontender with normal range of motion in all extremities.  Neurologic:  Normal speech and language. No gross focal neurologic deficits Skin:  Skin is warm, dry and intact.  Psychiatric: Mood and affect are normal.   ____________________________________________    EKG  EKG viewed and interpreted by myself shows a normal sinus rhythm at 97 bpm with a narrow QRS, normal axis, normal intervals,  no concerning ST changes.  ____________________________________________    RADIOLOGY  Chest x-ray shows no acute abnormality.  ____________________________________________   INITIAL IMPRESSION / ASSESSMENT AND PLAN / ED COURSE  Pertinent labs & imaging results that were available during my care of  the patient were reviewed by me and considered in my medical decision making (see chart for details).   Patient presents to the emergency department for chest pain slight cough and increased activity today.  [name] states recent fall which could be contributing to the intermittent chest pain.  No chest pain currently.  Patient did to start prednisone yesterday which could be contributing to the hyperactivity.  Patient has mild dementia at baseline per [name].  Overall patient appears well reassuring physical exam.  [name] is hypertensive but is not taken her evening medications yet.  Lab work largely at baseline, slight hyperglycemia but again has not taken her evening medications.  Troponin negative.  We will repeat a troponin as a precaution.  If negative anticipate likely discharge home.  Patient and [name] agreeable to plan of care.  Repeat troponin slightly changed but remains negative.  Overall patient appears well, no pain at this time.  We will discharge patient home with cardiology follow-up.  I provided my normal chest pain return precautions.  Patient agreeable to plan of care.  BRADLEIGH HEDIN was evaluated in Emergency Department on 01/19/2020 for the symptoms described in the history of present illness. [name] was evaluated in the context of the global COVID-19 pandemic, which necessitated consideration that the patient might be at risk for infection with the SARS-CoV-2 virus that causes COVID-19. Institutional protocols and algorithms that pertain to the evaluation of patients at risk for COVID-19 are in a state of rapid change based on information released by regulatory bodies  including the CDC and federal and state organizations. These policies and algorithms were followed during the patient's care in the ED.  ____________________________________________   FINAL CLINICAL IMPRESSION(S) / ED DIAGNOSES  Chest pain   Harvest Dark, MD 01/19/20 0117

## 2020-01-19 NOTE — Discharge Instructions (Signed)
Please call the number provided for cardiology to arrange a follow-up appointment.  Return to the emergency department for any return of/worsening chest pain, development of shortness of breath, or any other symptom personally concerning to yourself.

## 2020-01-23 DIAGNOSIS — R0789 Other chest pain: Secondary | ICD-10-CM | POA: Diagnosis not present

## 2020-01-24 ENCOUNTER — Ambulatory Visit (INDEPENDENT_AMBULATORY_CARE_PROVIDER_SITE_OTHER): Payer: Medicare Other | Admitting: Family Medicine

## 2020-01-24 ENCOUNTER — Encounter: Payer: Self-pay | Admitting: Family Medicine

## 2020-01-24 ENCOUNTER — Ambulatory Visit: Payer: Self-pay

## 2020-01-24 ENCOUNTER — Other Ambulatory Visit: Payer: Self-pay

## 2020-01-24 VITALS — BP 152/71 | HR 91 | Temp 97.3°F | Resp 18 | Ht 63.0 in | Wt 149.0 lb

## 2020-01-24 DIAGNOSIS — F329 Major depressive disorder, single episode, unspecified: Secondary | ICD-10-CM

## 2020-01-24 DIAGNOSIS — C439 Malignant melanoma of skin, unspecified: Secondary | ICD-10-CM

## 2020-01-24 DIAGNOSIS — G3183 Dementia with Lewy bodies: Secondary | ICD-10-CM

## 2020-01-24 DIAGNOSIS — E1159 Type 2 diabetes mellitus with other circulatory complications: Secondary | ICD-10-CM | POA: Diagnosis not present

## 2020-01-24 DIAGNOSIS — J309 Allergic rhinitis, unspecified: Secondary | ICD-10-CM | POA: Diagnosis not present

## 2020-01-24 DIAGNOSIS — E1121 Type 2 diabetes mellitus with diabetic nephropathy: Secondary | ICD-10-CM

## 2020-01-24 DIAGNOSIS — R05 Cough: Secondary | ICD-10-CM

## 2020-01-24 DIAGNOSIS — K219 Gastro-esophageal reflux disease without esophagitis: Secondary | ICD-10-CM

## 2020-01-24 DIAGNOSIS — Z794 Long term (current) use of insulin: Secondary | ICD-10-CM

## 2020-01-24 DIAGNOSIS — R059 Cough, unspecified: Secondary | ICD-10-CM

## 2020-01-24 DIAGNOSIS — F32A Depression, unspecified: Secondary | ICD-10-CM

## 2020-01-24 DIAGNOSIS — E7800 Pure hypercholesterolemia, unspecified: Secondary | ICD-10-CM | POA: Diagnosis not present

## 2020-01-24 DIAGNOSIS — J449 Chronic obstructive pulmonary disease, unspecified: Secondary | ICD-10-CM

## 2020-01-24 DIAGNOSIS — F0280 Dementia in other diseases classified elsewhere without behavioral disturbance: Secondary | ICD-10-CM

## 2020-01-24 MED ORDER — FLUOXETINE HCL 20 MG PO CAPS: 20.0000 mg | ORAL_CAPSULE | Freq: Every day | ORAL | 12 refills | Status: DC

## 2020-01-24 NOTE — Progress Notes (Signed)
Established patient visit   Patient: [name]   DOB: Jan 29, 1929   86 y.o. Female  MRN: [id number] Visit Date: 01/24/2020  Today's healthcare provider: Wilhemena Durie, MD   Chief Complaint  Patient presents with  . Medication Management   Subjective    HPI Patient and family is here to discuss her medications.  She is having some progressive dementia problems and they want her off all medications which she does not need.  She was also told by someone recently she has carotid bruit.  They are wondering about follow-up for this.  Medications are reviewed with patient and family in depth.  Past Medical History:  Diagnosis Date  . Angina pectoris ([place])   . Atrial fibrillation ([place])   . Basal cell carcinoma    nose  . Cervicalgia   . Chronic back pain   . Chronic obstructive pulmonary disease (COPD) ([place])   . COPD (chronic obstructive pulmonary disease) ([place])   . DDD (degenerative disc disease), lumbar   . Diabetes mellitus without complication ([place])   . Dysphagia   . Hyperlipemia   . Hypertension   . Squamous cell carcinoma of skin    left lateral pretibial  . Vulvovaginitis        Medications: Outpatient Medications Prior to Visit  Medication Sig  . Ascorbic Acid (VITAMIN C) 1000 MG tablet Take 1,000 mg by mouth daily.   [place] Kitchen aspirin 325 MG tablet Take 325 mg by mouth daily. Reported on 01/05/2016  . atorvastatin (LIPITOR) 10 MG tablet Take 1 tablet (10 mg total) by mouth daily.  . cetirizine (ZYRTEC) 10 MG tablet TAKE ONE TABLET BY MOUTH EVERY DAY  . cholecalciferol (VITAMIN D3) 25 MCG (1000 UT) tablet Take 1,000 Units by mouth daily.  . clopidogrel (PLAVIX) 75 MG tablet Take 1 tablet (75 mg total) by mouth daily.  Marland Kitchen diltiazem (TIAZAC) 360 MG 24 hr capsule TAKE 1 CAPSULE EVERY DAY  . ezetimibe (ZETIA) 10 MG tablet Take 1 tablet (10 mg total) by mouth daily.  Marland Kitchen FLUoxetine (PROZAC) 40 MG capsule TAKE 1 CAPSULE BY MOUTH EVERY DAY  . Fluticasone-Salmeterol  (ADVAIR DISKUS) 250-50 MCG/DOSE AEPB Inhale 1 puff into the lungs 2 (two) times daily.  Mar[place] Kitchen glucose blood test strip ACCU-CHEK AVIVA PLUS TEST STRP Use to check sugars 3 times daily.  . insulin glargine (LANTUS) 100 UNIT/ML injection Inject 0.05 mLs (5 Units total) into the skin at bedtime. (Patient taking differently: Inject 10 Units into the skin at bedtime. )  . insulin lispro (HUMALOG KWIKPEN) 100 UNIT/ML KiwkPen Inject 10 Units into the skin 4 (four) times daily.   . lansoprazole (PREVACID) 30 MG capsule TAKE 1 CAPSULE BY MOUTH DAILY AT NOON  . losartan (COZAAR) 100 MG tablet TAKE ONE TABLET EVERY DAY  . Magnesium 400 MG CAPS Take by mouth daily.  . memantine (NAMENDA) 5 MG tablet TAKE ONE TABLET TWICE DAILY  . montelukast (SINGULAIR) 10 MG tablet TAKE 1 TABLET BY MOUTH AT BEDTIME  . potassium chloride (KLOR-CON) 10 MEQ tablet TAKE 1 TABLET BY MOUTH TWICE DAILY  . predniSONE (DELTASONE) 20 MG tablet Take 1 tablet (20 mg total) by mouth daily with breakfast.  . traZODone (DESYREL) 50 MG tablet TAKE 1 AND 1/2 TABLET AT BEDTIME AS NEEDED FOR SLEEP  . TUSSIN DM 100-10 MG/5ML liquid TAKE 1 TEASPOONFUL BY MOUTH EVERY 4 HOURS AS NEEDED FOR COUGH  . vitamin E 1000 UNIT capsule Take 1,000 Units by mouth daily.  Marland Kitchen  albuterol (VENTOLIN HFA) 108 (90 Base) MCG/ACT inhaler INHALE 2 PUFFS INTO THE LUNGS EVERY 4 HOURS AS NEEDED FOR WHEEZING ORSHORTNESS OF BREATH  . guaiFENesin (MUCINEX) 600 MG 12 hr tablet Take 1 tablet (600 mg total) by mouth 2 (two) times daily as needed for up to 15 days for cough or to loosen phlegm. (Patient not taking: Reported on 01/24/2020)  . Respiratory Therapy Supplies (FLUTTER) DEVI Use as directed (Patient not taking: Reported on 01/24/2020)   No facility-administered medications prior to visit.    Review of Systems  Constitutional: Negative.   HENT: Negative.   Eyes: Negative.   Respiratory: Negative.   Cardiovascular: Negative.   Gastrointestinal: Negative.   Endocrine:  Negative.   Allergic/Immunologic: Negative.   Neurological:       Memory loss with hallucinations.  No behavioral issues to this point.  She is aware of the hallucinations are not real.  They are visual not auditory.  Hematological: Negative.   Psychiatric/Behavioral: Negative.     Last hemoglobin A1c Lab Results  Component Value Date   HGBA1C 7.9 (H) 04/05/2019      Objective    BP (!) 152/71 (BP Location: Right Arm, Patient Position: Sitting, Cuff Size: Large)   Pulse 91   Temp (!) 97.3 F (36.3 C) (Other (Comment))   Resp 18   Ht 5\' 3"  (1.6 m)   Wt 149 lb (67.6 kg)   SpO2 95%   BMI 26.39 kg/m  BP Readings from Last 3 Encounters:  01/24/20 (!) 152/71  01/19/20 (!) 169/78  01/10/20 126/78   Wt Readings from Last 3 Encounters:  01/24/20 149 lb (67.6 kg)  01/18/20 140 lb (63.5 kg)  01/10/20 148 lb 6.4 oz (67.3 kg)      Physical Exam Vitals reviewed.  Constitutional:      Appearance: She is well-developed.  HENT:     Head: Normocephalic and atraumatic.  Eyes:     General: No scleral icterus.    Conjunctiva/sclera: Conjunctivae normal.  Neck:     Thyroid: No thyromegaly.     Vascular: No carotid bruit.     Comments: She has bilateral carotid bruit. Cardiovascular:     Rate and Rhythm: Normal rate and regular rhythm.  Pulmonary:     Effort: Pulmonary effort is normal.  Abdominal:     Palpations: Abdomen is soft.  Skin:    General: Skin is warm and dry.     Findings: Laceration present.     Comments: Very fair skin.  Neurological:     Mental Status: She is alert and oriented to person, place, and time. Mental status is at baseline.  Psychiatric:        Mood and Affect: Mood normal.        Behavior: Behavior normal.       No results found for any visits on 01/24/20.  Assessment & Plan     1. Hypercholesteremia Order lipid profile of this not has not been obtained for a couple of years.  Diabetes followed by endocrinology - Lipid panel  2. Type 2  diabetes mellitus with vascular disease ([place]) Vascular disease specifically PAD and carotid bruit 5 followed by Dr. Lucky Cowboy.  She had carotid Dopplers last fall and is scheduled for same this September  3. Chronic obstructive pulmonary disease, unspecified COPD type ([place]) Stable and doing well  4. Allergic rhinitis, unspecified seasonality, unspecified trigger Stable  5. Gastroesophageal reflux disease, unspecified whether esophagitis present Stable without changes  6. Type 2  diabetes mellitus with diabetic nephropathy, with long-term current use of insulin [facility]) Per endocrinology  7. Lewy body dementia without behavioral disturbance ([place]) Clinically she has Lewy body dementia but MRI is negative.  Will await for further declaration of more accurate diagnosis going forward  8. Malignant melanoma of skin ([place]) She has had basal cell and squamous cell also.  She has very fair skin.  No skin issues today other than AK's  9. Cough Improved  10. Depression, unspecified depression type This is a 1 medicine I think she can come off of.  Decrease fluoxetine from 40 to 20 mg daily and then discontinue in a few weeks. We spent more than 30 minutes reviewing her medications.  I told him there is anything else that she can come off of unless other decisions are made about her ongoing medical care. Follow-up in July No follow-ups on file.      I, Wilhemena Durie, MD, have reviewed all documentation for this visit. The documentation on 01/28/20 for the exam, diagnosis, procedures, and orders are all accurate and complete.    Keyshia Orwick Cranford Mon, MD  [facility] [phone] (phone) [phone] (fax)  [place]

## 2020-01-24 NOTE — Chronic Care Management (AMB) (Signed)
  Chronic Care Management   Note  01/24/2020 Name: [name] MRN: [id number] DOB: 1929-07-16    Brief outreach with [name]'s daughter [name]. Reports they are preparing for an appointment today. She expressed concerns regarding [name]'s medications. She noticed a recent increase in falls and episodes of dizziness. She requested a medication review with [name] today.    Fall Risk  01/24/2020 01/01/2020 09/06/2019 07/26/2019 03/01/2019  Falls in the past year? 1 1 1 1  0  Comment - - Reports last fall in 07/2019 - -  Number falls in past yr: 1 1 1 1  -  Injury with Fall? 1 - 0 0 -  Risk for fall due to : History of fall(s);Impaired balance/gait;Medication side effect;Other (Comment) Impaired balance/gait;Medication side effect;Impaired vision;History of fall(s) - History of fall(s);Impaired balance/gait -  Risk for fall due to: Comment Vision declining (per daughter) Caregiver reports vision is declining. - - -  Follow up Falls prevention discussed Falls prevention discussed - Falls prevention discussed -  Comment - - - Discussed with daughter [name] -     PLAN [name] is pending evaluation and medication review with [name] today.  The care management team will follow up to discuss plan and need for in-home services with her daughter [name] next week.    [name] [facility] Care Management [phone]

## 2020-01-29 ENCOUNTER — Ambulatory Visit: Payer: Self-pay

## 2020-01-29 NOTE — Chronic Care Management (AMB) (Signed)
  Chronic Care Management   Note  01/29/2020 Name: [name] MRN: [id number] DOB: 1929/03/01    [name]'s daughter [name] indicated that she will require assistance in the home.  She previously discussed options for services with [name]'s health insurance provider. Pending receipt of list of available agencies.   Follow up plan: The care management team will follow-up later this month.  Horris Latino [facility] Care Management [phone]

## 2020-02-04 ENCOUNTER — Other Ambulatory Visit: Payer: Self-pay | Admitting: Family Medicine

## 2020-02-04 DIAGNOSIS — J449 Chronic obstructive pulmonary disease, unspecified: Secondary | ICD-10-CM

## 2020-02-05 ENCOUNTER — Encounter: Payer: Self-pay | Admitting: *Deleted

## 2020-02-12 DIAGNOSIS — E7800 Pure hypercholesterolemia, unspecified: Secondary | ICD-10-CM | POA: Diagnosis not present

## 2020-02-12 DIAGNOSIS — R809 Proteinuria, unspecified: Secondary | ICD-10-CM | POA: Diagnosis not present

## 2020-02-12 DIAGNOSIS — E1121 Type 2 diabetes mellitus with diabetic nephropathy: Secondary | ICD-10-CM | POA: Diagnosis not present

## 2020-02-12 DIAGNOSIS — N1832 Chronic kidney disease, stage 3b: Secondary | ICD-10-CM | POA: Diagnosis not present

## 2020-02-12 DIAGNOSIS — E1165 Type 2 diabetes mellitus with hyperglycemia: Secondary | ICD-10-CM | POA: Diagnosis not present

## 2020-02-12 LAB — LIPID PANEL
Cholesterol: 128 (ref 0–200)
HDL: 62 (ref 35–70)
LDL Cholesterol: 49
Triglycerides: 85 (ref 40–160)

## 2020-02-13 ENCOUNTER — Telehealth: Payer: Self-pay | Admitting: Family Medicine

## 2020-02-13 LAB — LAB REPORT - SCANNED
Chol/HDL Ratio: 2.1
VLDL: 17 mg/dL

## 2020-02-13 NOTE — Telephone Encounter (Signed)
Attempted to schedule AWV. Unable to LVM.  Will try at later time. No voicemail. 

## 2020-02-18 ENCOUNTER — Telehealth (INDEPENDENT_AMBULATORY_CARE_PROVIDER_SITE_OTHER): Payer: Self-pay

## 2020-02-18 NOTE — Telephone Encounter (Signed)
Can you please call the pt and schedule them per the NP's instructions.

## 2020-02-18 NOTE — Telephone Encounter (Signed)
Bring pt in with mesenteric and abis...jd or me

## 2020-02-18 NOTE — Telephone Encounter (Signed)
The pts daughter called and said that the pt is having stomach pain that is traveling down her leg. The pts daughter says the stomach pain is coming from the site of her Visceral Angio from 05/10/2019. The pt is not having any swelling , discoloration or numbness and the pain in her stomach is an aching pain and constant.

## 2020-02-19 ENCOUNTER — Telehealth: Payer: Self-pay

## 2020-02-19 ENCOUNTER — Ambulatory Visit: Payer: Self-pay

## 2020-02-19 DIAGNOSIS — E1122 Type 2 diabetes mellitus with diabetic chronic kidney disease: Secondary | ICD-10-CM | POA: Diagnosis not present

## 2020-02-19 DIAGNOSIS — E1142 Type 2 diabetes mellitus with diabetic polyneuropathy: Secondary | ICD-10-CM | POA: Diagnosis not present

## 2020-02-19 DIAGNOSIS — E1129 Type 2 diabetes mellitus with other diabetic kidney complication: Secondary | ICD-10-CM | POA: Diagnosis not present

## 2020-02-19 DIAGNOSIS — E1159 Type 2 diabetes mellitus with other circulatory complications: Secondary | ICD-10-CM | POA: Diagnosis not present

## 2020-02-19 DIAGNOSIS — E11649 Type 2 diabetes mellitus with hypoglycemia without coma: Secondary | ICD-10-CM | POA: Diagnosis not present

## 2020-02-19 NOTE — Chronic Care Management (AMB) (Signed)
  Chronic Care Management   Outreach Note  02/19/2020 Name: [name] MRN: [id number] DOB: 01-Nov-1928  Primary Care Provider: Jerrol Banana., MD Reason for referral : Chronic Care Management    An unsuccessful telephone outreach was attempted today. [name] is currently engaged with the chronic care management team.    Follow Up Plan The care management team will reach out to [name] again within the next two to three weeks.   Horris Latino [facility] Practice[facility] Care Management [phone]

## 2020-02-26 ENCOUNTER — Other Ambulatory Visit: Payer: Self-pay | Admitting: Family Medicine

## 2020-02-26 NOTE — Telephone Encounter (Signed)
Requested medication (s) are due for refill today:   Yes  Requested medication (s) are on the active medication list:   Yes  Future visit scheduled:   Yes   Last ordered: 08/13/2019 #90 1 refill.  Returned because pharmacy needs a new Rx.     Requested Prescriptions  Pending Prescriptions Disp Refills   lansoprazole (PREVACID) 30 MG capsule [Pharmacy Med Name: LANSOPRAZOLE 30 MG CAP] 90 capsule 1    Sig: TAKE 1 CAPSULE BY MOUTH DAILY AT [place]      Gastroenterology: Proton Pump Inhibitors Passed - 02/26/2020  2:12 PM      Passed - Valid encounter within last 12 months    Recent Outpatient Visits           1 month ago [place] Jerrol Banana., MD   1 month ago Noninfected skin tear of left lower extremity, initial encounter   [place], [place], [place]   8 months ago Benign essential HTN   [facility] Jerrol Banana., MD   10 months ago [place], [place]   11 months ago Rectal bleeding   [facility] Jerrol Banana., MD       Future Appointments             In 1 month Jerrol Banana., MD [name], [place]

## 2020-03-05 ENCOUNTER — Other Ambulatory Visit: Payer: Self-pay

## 2020-03-05 ENCOUNTER — Ambulatory Visit (INDEPENDENT_AMBULATORY_CARE_PROVIDER_SITE_OTHER): Payer: Medicare Other | Admitting: Nurse Practitioner

## 2020-03-05 ENCOUNTER — Ambulatory Visit (INDEPENDENT_AMBULATORY_CARE_PROVIDER_SITE_OTHER): Payer: Medicare Other

## 2020-03-05 ENCOUNTER — Encounter (INDEPENDENT_AMBULATORY_CARE_PROVIDER_SITE_OTHER): Payer: Self-pay | Admitting: Nurse Practitioner

## 2020-03-05 VITALS — BP 172/72 | HR 83 | Ht 62.0 in | Wt 142.0 lb

## 2020-03-05 DIAGNOSIS — I10 Essential (primary) hypertension: Secondary | ICD-10-CM

## 2020-03-05 DIAGNOSIS — I739 Peripheral vascular disease, unspecified: Secondary | ICD-10-CM

## 2020-03-05 DIAGNOSIS — I6523 Occlusion and stenosis of bilateral carotid arteries: Secondary | ICD-10-CM

## 2020-03-05 DIAGNOSIS — I774 Celiac artery compression syndrome: Secondary | ICD-10-CM | POA: Diagnosis not present

## 2020-03-05 DIAGNOSIS — M79605 Pain in left leg: Secondary | ICD-10-CM | POA: Diagnosis not present

## 2020-03-05 DIAGNOSIS — I771 Stricture of artery: Secondary | ICD-10-CM

## 2020-03-05 NOTE — Progress Notes (Signed)
Subjective:    Patient ID: [name], female    DOB: 02-22-1929, 86 y.o.   MRN: [id number] Chief Complaint  Patient presents with  . Follow-up    U/S Follow up    The patient presents today for noninvasive studies after her daughter contacted our office stating that her mother has had some pain in her left groin area.  This is the same area the patient underwent recent angiogram to her celiac arteries as well as her external iliac.  Angiogram was done on 05/10/2019.  The patient notes that she has not had any food phobia or, abdominal pain, weight loss or nausea or vomiting.  She denies any worsening claudication-like symptoms.  Today noninvasive studies show the celiac artery, superior mesenteric artery, inferior mesenteric artery, splenic artery and hepatic arteries are normal.  The velocities are improved compared to the previous study.  The SMA does have velocities in the upper limit of normal.  The patient underwent bilateral ABIs which shows an ABI of 1.01 on the right and 1.07 on the left.  The patient has a TBI 0.61 on the right and 0.77 on the left.  The patient has biphasic tibial artery waveforms bilaterally with good toe waveforms bilaterally.   Review of Systems  Musculoskeletal: Positive for arthralgias and gait problem.  All other systems reviewed and are negative.      Objective:   Physical Exam Vitals reviewed.  HENT:     Head: Normocephalic.  Neck:     Vascular: Carotid bruit present.  Cardiovascular:     Rate and Rhythm: Normal rate and regular rhythm.     Pulses: Normal pulses.     Heart sounds: Murmur present.  Pulmonary:     Effort: Pulmonary effort is normal.     Breath sounds: Normal breath sounds.  Neurological:     Mental Status: She is alert and oriented to person, place, and time.     Motor: Weakness present.     Gait: Gait abnormal.  Psychiatric:        Mood and Affect: Mood normal.        Behavior: Behavior normal.        Thought  Content: Thought content normal.        Judgment: Judgment normal.     BP (!) 172/72   Pulse 83   Ht 5\' 2"  (1.575 m)   Wt 142 lb (64.4 kg)   BMI 25.97 kg/m   Past Medical History:  Diagnosis Date  . Angina pectoris ([place])   . Atrial fibrillation ([place])   . Basal cell carcinoma    nose  . Cervicalgia   . Chronic back pain   . Chronic obstructive pulmonary disease (COPD) ([place])   . COPD (chronic obstructive pulmonary disease) ([place])   . DDD (degenerative disc disease), lumbar   . Diabetes mellitus without complication ([place])   . Dysphagia   . Hyperlipemia   . Hypertension   . Squamous cell carcinoma of skin    left lateral pretibial  . Vulvovaginitis     Social History   Socioeconomic History  . Marital status: Widowed    Spouse name: Not on file  . Number of children: 3  . Years of education: Not on file  . Highest education level: 8th grade  Occupational History  . Occupation: retired  Tobacco Use  . Smoking status: Never Smoker  . Smokeless tobacco: Never Used  Substance and Sexual Activity  . Alcohol use: No  .  Drug use: No  . Sexual activity: Never  Other Topics Concern  . Not on file  Social History Narrative  . Not on file   Social Determinants of Health   Financial Resource Strain:   . Difficulty of Paying Living Expenses:   Food Insecurity:   . Worried About Charity fundraiser in the Last Year:   . Arboriculturist in the Last Year:   Transportation Needs:   . Film/video editor (Medical):   Marland Kitchen Lack of Transportation (Non-Medical):   Physical Activity:   . Days of Exercise per Week:   . Minutes of Exercise per Session:   Stress:   . Feeling of Stress :   Social Connections: Unknown  . Frequency of Communication with Friends and Family: More than three times a week  . Frequency of Social Gatherings with Friends and Family: More than three times a week  . Attends Religious Services: Not asked  . Active Member of Clubs or Organizations: Not  asked  . Attends Archivist Meetings: Not asked  . Marital Status: Not asked  Intimate Partner Violence:   . Fear of Current or Ex-Partner:   . Emotionally Abused:   Marland Kitchen Physically Abused:   . Sexually Abused:     Past Surgical History:  Procedure Laterality Date  . gall bladder    . melanoma     removal neck and back  . PERIPHERAL VASCULAR CATHETERIZATION Left 01/05/2016   Procedure: Lower Extremity Angiography;  Surgeon: Algernon Huxley, MD;  Location: [facility];  Service: Cardiovascular;  Laterality: Left;  . PERIPHERAL VASCULAR CATHETERIZATION  01/05/2016   Procedure: Lower Extremity Intervention;  Surgeon: Algernon Huxley, MD;  Location: [facility];  Service: Cardiovascular;;  . ureterolithiasis     calculus removed  . VAGINAL HYSTERECTOMY    . VISCERAL ANGIOGRAPHY N/A 05/10/2019   Procedure: VISCERAL ANGIOGRAPHY;  Surgeon: Algernon Huxley, MD;  Location: [facility];  Service: Cardiovascular;  Laterality: N/A;  . VULVA / PERINEUM BIOPSY  05/29/2015    Family History  Problem Relation Age of Onset  . Ovarian cancer Other   . Diabetes Sister   . Colon cancer Brother   . Diabetes Brother   . Atrial fibrillation Daughter   . Breast cancer Neg Hx     Allergies  Allergen Reactions  . Bacitracin-Neomycin-Polymyxin Rash  . Neomycin-Bacitracin Zn-Polymyx Swelling and Rash  . Latex Rash  . Lidocaine Rash  . Aricept [Donepezil Hcl] Diarrhea  . Benzalkonium Chloride Itching and Swelling  . Ibuprofen     Other reaction(s): Dizziness Heart fluttering. Tachycardia. Tachycardia.  . Valdecoxib Nausea And Vomiting  . Albuterol Rash  . Tape Rash    blisters  . Triamcinolone Rash       Assessment & Plan:   1. Celiac artery stenosis (HCC) Recommend:  The patient is status post successful angiogram with intervention of the mesenteric vessels.  .  The patient reports that the abdominal pain is improved and the post prandial symptoms are essentially  gone.   The patient denies lifestyle limiting changes at this point in time.  No further invasive studies, angiography or surgery at this time The patient should continue walking and begin a more formal exercise program.  The patient should continue antiplatelet therapy and aggressive treatment of the lipid abnormalities   Patient should undergo noninvasive studies as ordered. The patient will follow up with me after the studies.  2. Bilateral carotid artery stenosis The patient does have bilateral carotid artery bruits.  The patient does have known carotid artery disease based on noninvasive studies that were done back in 2019.  She had a greater than 50% stenosis in the bilateral internal carotid arteries.  We will obtain a carotid artery duplex when the patient follows up as we have not had those in some time.  3. PVD (peripheral vascular disease) ([place])  Recommend:  The patient has evidence of atherosclerosis of the lower extremities.  The patient does not voice lifestyle limiting changes at this point in time.  Noninvasive studies do not suggest clinically significant change.  No invasive studies, angiography or surgery at this time The patient should continue walking and begin a more formal exercise program.  The patient should continue antiplatelet therapy and aggressive treatment of the lipid abnormalities  No changes in the patient's medications at this time  The patient should continue wearing graduated compression socks 10-15 mmHg strength to control the mild edema.    4. Benign essential HTN Continue antihypertensive medications as already ordered, these medications have been reviewed and there are no changes at this time.   5. Pain of left lower extremity Based on all the noninvasive studies today, it is likely that the pain in the groin that is in her left lower extremity may be more musculoskeletal related.  The patient is advised to follow-up with her PCP for  further work-up, as this could be related to hip pain or possibly back pain.   Current Outpatient Medications on File Prior to Visit  Medication Sig Dispense Refill  . albuterol (VENTOLIN HFA) 108 (90 Base) MCG/ACT inhaler INHALE 2 PUFFS INTO THE LUNGS EVERY 4 HOURS AS NEEDED FOR WHEEZING ORSHORTNESS OF BREATH 8.5 g 10  . Ascorbic Acid (VITAMIN C) 1000 MG tablet Take 1,000 mg by mouth daily.     Marland Kitchen aspirin 325 MG tablet Take 325 mg by mouth daily. Reported on 01/05/2016    . atorvastatin (LIPITOR) 10 MG tablet Take 1 tablet (10 mg total) by mouth daily. 30 tablet 11  . cetirizine (ZYRTEC) 10 MG tablet TAKE ONE TABLET BY MOUTH EVERY DAY 90 tablet 3  . cholecalciferol (VITAMIN D3) 25 MCG (1000 UT) tablet Take 1,000 Units by mouth daily.    . clopidogrel (PLAVIX) 75 MG tablet Take 1 tablet (75 mg total) by mouth daily. 30 tablet 11  . diltiazem (TIAZAC) 360 MG 24 hr capsule TAKE 1 CAPSULE EVERY DAY 90 capsule 3  . ezetimibe (ZETIA) 10 MG tablet Take 1 tablet (10 mg total) by mouth daily. 30 tablet 12  . FLUoxetine (PROZAC) 20 MG capsule Take 1 capsule (20 mg total) by mouth daily. 30 capsule 12  . Fluticasone-Salmeterol (ADVAIR DISKUS) 250-50 MCG/DOSE AEPB Inhale 1 puff into the lungs 2 (two) times daily. 14 each 6  . glucose blood test strip ACCU-CHEK AVIVA PLUS TEST STRP Use to check sugars 3 times daily.    . insulin glargine (LANTUS) 100 UNIT/ML injection Inject 0.05 mLs (5 Units total) into the skin at bedtime. (Patient taking differently: Inject 10 Units into the skin at bedtime. ) 10 mL 11  . insulin lispro (HUMALOG KWIKPEN) 100 UNIT/ML KiwkPen Inject 10 Units into the skin 4 (four) times daily.     . lansoprazole (PREVACID) 30 MG capsule TAKE 1 CAPSULE BY MOUTH DAILY AT NOON 90 capsule 1  . losartan (COZAAR) 100 MG tablet TAKE ONE TABLET EVERY DAY 90 tablet 3  .  Magnesium 400 MG CAPS Take by mouth daily.    . memantine (NAMENDA) 5 MG tablet TAKE ONE TABLET TWICE DAILY 60 tablet 12  .  montelukast (SINGULAIR) 10 MG tablet TAKE 1 TABLET BY MOUTH AT BEDTIME 90 tablet 3  . potassium chloride (KLOR-CON) 10 MEQ tablet TAKE 1 TABLET BY MOUTH TWICE DAILY 180 tablet 1  . predniSONE (DELTASONE) 20 MG tablet Take 1 tablet (20 mg total) by mouth daily with breakfast. 10 tablet 0  . Respiratory Therapy Supplies (FLUTTER) DEVI Use as directed 1 each 0  . traZODone (DESYREL) 50 MG tablet TAKE 1 AND 1/2 TABLET AT BEDTIME AS NEEDED FOR SLEEP 45 tablet 11  . TUSSIN DM 100-10 MG/5ML liquid TAKE 1 TEASPOONFUL BY MOUTH EVERY 4 HOURS AS NEEDED FOR COUGH 118 mL 0  . vitamin E 1000 UNIT capsule Take 1,000 Units by mouth daily.     No current facility-administered medications on file prior to visit.    There are no Patient Instructions on file for this visit. No follow-ups on file.   Kris Hartmann, NP

## 2020-03-06 ENCOUNTER — Telehealth: Payer: Self-pay

## 2020-03-06 ENCOUNTER — Ambulatory Visit: Payer: Self-pay

## 2020-03-06 NOTE — Chronic Care Management (AMB) (Signed)
°  Chronic Care Management   Outreach Note  03/06/2020 Name: [name] MRN: [id number] DOB: 07-12-1929  Primary Care Provider: Jerrol Banana., MD Reason for referral : Chronic Care Management   An unsuccessful telephone outreach was attempted today. [name] is currently engaged with the chronic care management team.   A HIPAA compliant voice message was left for [name] today requesting a return call.     Follow Up Plan The care management team will follow-up later this month.    Horris Latino [facility] Care Management [phone]

## 2020-03-17 NOTE — Progress Notes (Signed)
Established patient visit  I,[name],acting as a scribe for Wilhemena Durie, MD.,have documented all relevant documentation on the behalf of Wilhemena Durie, MD,as directed by  Wilhemena Durie, MD while in the presence of Wilhemena Durie, MD.   Patient: [name]   DOB: 1929/04/22   86 y.o. Female  MRN: [id number] Visit Date: 03/18/2020  Today's healthcare provider: Wilhemena Durie, MD   Chief Complaint  Patient presents with  . Follow-up  . Depression  . COPD   Subjective    HPI  She comes in today to review of her chronic problems and to see if she can get off any of her medications. She is doing well on the lower dose of fluoxetine. She has two complaints today. She is usually constipated but for the has had diarrhea for the past week about twice per day. She states thinks she has seen blood in her stool. She brings in a cup full of liquid stool to the office today. The other issue is one of chronic left shoulder pain with abduction of the shoulder. No known trauma. The family is concerned and that she is having problems functioning at home as she still lives alone. They're also worried about her taking her medication as prescribed. Chronic obstructive pulmonary disease, unspecified COPD type ([place]) From 01/24/2020-Stable and doing well.  Lewy body dementia without behavioral disturbance ([place]) From 01/24/2020-Clinically she has Lewy body dementia but MRI is negative.  Will await for further declaration of more accurate diagnosis going forward.  Malignant melanoma of skin ([place]) From 01/24/2020-She has had basal cell and squamous cell also.  She has very fair skin.  No skin issues today other than AK's.  Depression, unspecified depression type From 01/24/2020-This is a 1 medicine I think she can come off of.  Decrease fluoxetine from 40 to 20 mg daily and then discontinue in a few weeks. We spent more than 30 minutes reviewing her medications.  I  told him there is anything else that she can come off of unless other decisions are made about her ongoing medical care. Follow-up in July No follow-ups on file.          Medications: Outpatient Medications Prior to Visit  Medication Sig  . albuterol (VENTOLIN HFA) 108 (90 Base) MCG/ACT inhaler INHALE 2 PUFFS INTO THE LUNGS EVERY 4 HOURS AS NEEDED FOR WHEEZING ORSHORTNESS OF BREATH  . Ascorbic Acid (VITAMIN C) 1000 MG tablet Take 1,000 mg by mouth daily.   Marland Kitchen aspirin 325 MG tablet Take 325 mg by mouth daily. Reported on 01/05/2016  . atorvastatin (LIPITOR) 10 MG tablet Take 1 tablet (10 mg total) by mouth daily.  . cetirizine (ZYRTEC) 10 MG tablet TAKE ONE TABLET BY MOUTH EVERY DAY  . cholecalciferol (VITAMIN D3) 25 MCG (1000 UT) tablet Take 1,000 Units by mouth daily.  . clopidogrel (PLAVIX) 75 MG tablet Take 1 tablet (75 mg total) by mouth daily.  Marland Kitchen diltiazem (TIAZAC) 360 MG 24 hr capsule TAKE 1 CAPSULE EVERY DAY  . ezetimibe (ZETIA) 10 MG tablet Take 1 tablet (10 mg total) by mouth daily.  Marland Kitchen FLUoxetine (PROZAC) 20 MG capsule Take 1 capsule (20 mg total) by mouth daily.  . Fluticasone-Salmeterol (ADVAIR DISKUS) 250-50 MCG/DOSE AEPB Inhale 1 puff into the lungs 2 (two) times daily.  Marland Kitchen glucose blood test strip ACCU-CHEK AVIVA PLUS TEST STRP Use to check sugars 3 times daily.  . insulin glargine (LANTUS) 100 UNIT/ML injection Inject  0.05 mLs (5 Units total) into the skin at bedtime. (Patient taking differently: Inject 10 Units into the skin at bedtime. )  . insulin lispro (HUMALOG KWIKPEN) 100 UNIT/ML KiwkPen Inject 10 Units into the skin 4 (four) times daily.   . lansoprazole (PREVACID) 30 MG capsule TAKE 1 CAPSULE BY MOUTH DAILY AT NOON  . losartan (COZAAR) 100 MG tablet TAKE ONE TABLET EVERY DAY  . Magnesium 400 MG CAPS Take by mouth daily.  . memantine (NAMENDA) 5 MG tablet TAKE ONE TABLET TWICE DAILY  . montelukast (SINGULAIR) 10 MG tablet TAKE 1 TABLET BY MOUTH AT BEDTIME  .  potassium chloride (KLOR-CON) 10 MEQ tablet TAKE 1 TABLET BY MOUTH TWICE DAILY  . predniSONE (DELTASONE) 20 MG tablet Take 1 tablet (20 mg total) by mouth daily with breakfast.  . Respiratory Therapy Supplies (FLUTTER) DEVI Use as directed  . traZODone (DESYREL) 50 MG tablet TAKE 1 AND 1/2 TABLET AT BEDTIME AS NEEDED FOR SLEEP  . vitamin E 1000 UNIT capsule Take 1,000 Units by mouth daily.  Cecil Cranker DM 100-10 MG/5ML liquid TAKE 1 TEASPOONFUL BY MOUTH EVERY 4 HOURS AS NEEDED FOR COUGH (Patient not taking: Reported on 03/18/2020)   No facility-administered medications prior to visit.    Review of Systems  Constitutional: Negative for appetite change, chills, fatigue and fever.  HENT: Negative.   Eyes: Negative.   Respiratory: Negative for chest tightness and shortness of breath.   Cardiovascular: Negative for chest pain and palpitations.  Gastrointestinal: Negative for abdominal pain, nausea and vomiting.  Endocrine: Negative.   Skin:       Chronic sun damage skin  Allergic/Immunologic: Negative.   Neurological: Negative for dizziness and weakness.  Hematological: Negative.   Psychiatric/Behavioral: Negative.       Objective    BP (!) 140/58 (BP Location: Left Arm, Patient Position: Sitting, Cuff Size: Large)   Pulse 68   Temp (!) 97.5 F (36.4 C) (Other (Comment))   Resp 18   Ht 5\' 2"  (1.575 m)   SpO2 97%   BMI 25.97 kg/m    Physical Exam Vitals reviewed.  Constitutional:      Appearance: She is well-developed.  HENT:     Head: Normocephalic and atraumatic.  Eyes:     General: No scleral icterus.    Conjunctiva/sclera: Conjunctivae normal.  Neck:     Thyroid: No thyromegaly.     Vascular: No carotid bruit.  Cardiovascular:     Rate and Rhythm: Normal rate and regular rhythm.  Pulmonary:     Effort: Pulmonary effort is normal.  Skin:    General: Skin is warm and dry.     Findings: Laceration present.     Comments: Very fair skin.  Neurological:     Mental  Status: She is alert and oriented to person, place, and time.  Psychiatric:        Mood and Affect: Mood normal.        Behavior: Behavior normal.       Results for orders placed or performed in visit on 03/18/20  IFOBT POC (occult bld, rslt in office)  Result Value Ref Range   IFOBT Negative     Assessment & Plan     1. Encounter for screening fecal occult blood testing Stool is negative for blood that is brought in by the patient. - IFOBT POC (occult bld, rslt in office)--Negative   2. Left shoulder pain, unspecified chronicity Pain with abduction and anterior tenderness. Refer to Dr.  Bacigalupo for consideration of injection after x-ray. We'll try low-dose Mobic until she sees Dr. Jacinto Reap - DG Shoulder Left - meloxicam (MOBIC) 7.5 MG tablet; Take 1 tablet (7.5 mg total) by mouth daily as needed for pain.  Dispense: 30 tablet; Refill: 2  3. Dementia without behavioral disturbance, unspecified dementia type [facility]) Home health referral to look to see that the patient is safe at home. - Ambulatory referral to [facility]  4. Mild cognitive impairment I think this is progressed to dementia. - Ambulatory referral to Home Healt  5. Loose stools I think she has some constipation alternating with loose stool. Try MiraLAX daily.  6. Type 2 diabetes mellitus with vascular disease ([place]) Followed by endocrinology.  7. Paroxysmal atrial fibrillation ([place])   8. Bilateral carotid artery stenosis Followed by vascular  9. Heart & renal disease, hypertensive, with heart failure (HCC) Clinically stable  10. Chronic obstructive pulmonary disease, unspecified COPD type ([place])   11. Lewy body dementia without behavioral disturbance ([place]) She does not have Lewy bodies on imaging but this fits the type of dementia she has clinically.  12. Hallucinations   13. Avitaminosis D    Return in about 3 months (around 06/18/2020).      I, Wilhemena Durie, MD, have reviewed all  documentation for this visit. The documentation on 03/22/20 for the exam, diagnosis, procedures, and orders are all accurate and complete.    Unity Luepke Cranford Mon, MD  [facility] [phone] (phone) [phone] (fax)  [place]

## 2020-03-18 ENCOUNTER — Ambulatory Visit
Admission: RE | Admit: 2020-03-18 | Discharge: 2020-03-18 | Disposition: A | Discharge status: Home / Self Care | Payer: Medicare Other | Attending: Family Medicine | Admitting: Family Medicine

## 2020-03-18 ENCOUNTER — Ambulatory Visit (INDEPENDENT_AMBULATORY_CARE_PROVIDER_SITE_OTHER): Payer: Medicare Other | Admitting: Family Medicine

## 2020-03-18 ENCOUNTER — Encounter: Payer: Self-pay | Admitting: Family Medicine

## 2020-03-18 ENCOUNTER — Other Ambulatory Visit: Payer: Self-pay

## 2020-03-18 ENCOUNTER — Ambulatory Visit
Admission: RE | Admit: 2020-03-18 | Discharge: 2020-03-18 | Disposition: A | Discharge status: Home / Self Care | Payer: Medicare Other | Source: Ambulatory Visit | Attending: Family Medicine | Admitting: Family Medicine

## 2020-03-18 VITALS — BP 140/58 | HR 68 | Temp 97.5°F | Resp 18 | Ht 62.0 in

## 2020-03-18 DIAGNOSIS — Z1211 Encounter for screening for malignant neoplasm of colon: Secondary | ICD-10-CM | POA: Diagnosis not present

## 2020-03-18 DIAGNOSIS — I130 Hypertensive heart and chronic kidney disease with heart failure and stage 1 through stage 4 chronic kidney disease, or unspecified chronic kidney disease: Secondary | ICD-10-CM

## 2020-03-18 DIAGNOSIS — M25512 Pain in left shoulder: Secondary | ICD-10-CM

## 2020-03-18 DIAGNOSIS — G3184 Mild cognitive impairment, so stated: Secondary | ICD-10-CM

## 2020-03-18 DIAGNOSIS — E1159 Type 2 diabetes mellitus with other circulatory complications: Secondary | ICD-10-CM

## 2020-03-18 DIAGNOSIS — E559 Vitamin D deficiency, unspecified: Secondary | ICD-10-CM

## 2020-03-18 DIAGNOSIS — F0390 Unspecified dementia without behavioral disturbance: Secondary | ICD-10-CM

## 2020-03-18 DIAGNOSIS — M12812 Other specific arthropathies, not elsewhere classified, left shoulder: Secondary | ICD-10-CM | POA: Diagnosis not present

## 2020-03-18 DIAGNOSIS — I6523 Occlusion and stenosis of bilateral carotid arteries: Secondary | ICD-10-CM

## 2020-03-18 DIAGNOSIS — F0280 Dementia in other diseases classified elsewhere without behavioral disturbance: Secondary | ICD-10-CM

## 2020-03-18 DIAGNOSIS — G3183 Dementia with Lewy bodies: Secondary | ICD-10-CM

## 2020-03-18 DIAGNOSIS — R195 Other fecal abnormalities: Secondary | ICD-10-CM | POA: Diagnosis not present

## 2020-03-18 DIAGNOSIS — J449 Chronic obstructive pulmonary disease, unspecified: Secondary | ICD-10-CM

## 2020-03-18 DIAGNOSIS — R443 Hallucinations, unspecified: Secondary | ICD-10-CM

## 2020-03-18 DIAGNOSIS — I480 Paroxysmal atrial fibrillation: Secondary | ICD-10-CM

## 2020-03-18 LAB — IFOBT (OCCULT BLOOD): IFOBT: NEGATIVE

## 2020-03-18 MED ORDER — MELOXICAM 7.5 MG PO TABS: 7.5000 mg | ORAL_TABLET | Freq: Every day | ORAL | 2 refills | Status: DC | PRN

## 2020-03-18 NOTE — Patient Instructions (Addendum)
Over-the-counter Metamucil daily. Discontinue Flonase.

## 2020-03-21 ENCOUNTER — Telehealth: Payer: Self-pay | Admitting: Family Medicine

## 2020-03-21 ENCOUNTER — Telehealth: Payer: Self-pay

## 2020-03-21 DIAGNOSIS — F0390 Unspecified dementia without behavioral disturbance: Secondary | ICD-10-CM

## 2020-03-21 NOTE — Telephone Encounter (Signed)
Insurance will not cover for medical social work alone. Are there any other services that can be added to home health referral ?

## 2020-03-21 NOTE — Telephone Encounter (Signed)
Patient given xray results through [facility] and has read the provider's comments.

## 2020-03-21 NOTE — Telephone Encounter (Signed)
-----   Message from Jerrol Banana., MD sent at 03/19/2020  4:31 PM EDT ----- Some arthritic changes in shoulder.

## 2020-03-24 NOTE — Telephone Encounter (Signed)
Put an order for just home health evaluation for patient needs.

## 2020-03-25 ENCOUNTER — Ambulatory Visit (INDEPENDENT_AMBULATORY_CARE_PROVIDER_SITE_OTHER): Payer: Medicare Other

## 2020-03-25 DIAGNOSIS — E1159 Type 2 diabetes mellitus with other circulatory complications: Secondary | ICD-10-CM

## 2020-03-25 NOTE — Chronic Care Management (AMB) (Signed)
Chronic Care Management   Follow Up Note   03/25/2020 Name: [name] MRN: [id number] DOB: [birth year]  Primary Care Provider: Jerrol Banana., MD Reason for referral : Chronic Care Management  [name] is a 86 y.o. year old female who is a primary care patient of Jerrol Banana., MD. She is currently engaged with the chronic care management team. A brief outreach was conducted with her daughter [name] today.   Review of [name]'s status, including review of consultants reports, relevant labs and test results was conducted today. Collaboration with appropriate care team members was performed as part of the comprehensive evaluation and provision of chronic care management services.    SDOH (Social Determinants of Health) assessments performed: No     Outpatient Encounter Medications as of 03/25/2020  Medication Sig  . albuterol (VENTOLIN HFA) 108 (90 Base) MCG/ACT inhaler INHALE 2 PUFFS INTO THE LUNGS EVERY 4 HOURS AS NEEDED FOR WHEEZING ORSHORTNESS OF BREATH  . Ascorbic Acid (VITAMIN C) 1000 MG tablet Take 1,000 mg by mouth daily.   Marland Kitchen aspirin 325 MG tablet Take 325 mg by mouth daily. Reported on 01/05/2016  . atorvastatin (LIPITOR) 10 MG tablet Take 1 tablet (10 mg total) by mouth daily.  . cetirizine (ZYRTEC) 10 MG tablet TAKE ONE TABLET BY MOUTH EVERY DAY  . cholecalciferol (VITAMIN D3) 25 MCG (1000 UT) tablet Take 1,000 Units by mouth daily.  . clopidogrel (PLAVIX) 75 MG tablet Take 1 tablet (75 mg total) by mouth daily.  Marland Kitchen diltiazem (TIAZAC) 360 MG 24 hr capsule TAKE 1 CAPSULE EVERY DAY  . ezetimibe (ZETIA) 10 MG tablet Take 1 tablet (10 mg total) by mouth daily.  Marland Kitchen FLUoxetine (PROZAC) 20 MG capsule Take 1 capsule (20 mg total) by mouth daily.  . Fluticasone-Salmeterol (ADVAIR DISKUS) 250-50 MCG/DOSE AEPB Inhale 1 puff into the lungs 2 (two) times daily.  Marland Kitchen glucose blood test strip ACCU-CHEK AVIVA PLUS TEST STRP Use to check sugars 3 times  daily.  . insulin glargine (LANTUS) 100 UNIT/ML injection Inject 0.05 mLs (5 Units total) into the skin at bedtime. (Patient taking differently: Inject 10 Units into the skin at bedtime. )  . insulin lispro (HUMALOG KWIKPEN) 100 UNIT/ML KiwkPen Inject 10 Units into the skin 4 (four) times daily.   . lansoprazole (PREVACID) 30 MG capsule TAKE 1 CAPSULE BY MOUTH DAILY AT NOON  . losartan (COZAAR) 100 MG tablet TAKE ONE TABLET EVERY DAY  . Magnesium 400 MG CAPS Take by mouth daily.  . meloxicam (MOBIC) 7.5 MG tablet Take 1 tablet (7.5 mg total) by mouth daily as needed for pain.  . memantine (NAMENDA) 5 MG tablet TAKE ONE TABLET TWICE DAILY  . montelukast (SINGULAIR) 10 MG tablet TAKE 1 TABLET BY MOUTH AT BEDTIME  . potassium chloride (KLOR-CON) 10 MEQ tablet TAKE 1 TABLET BY MOUTH TWICE DAILY  . predniSONE (DELTASONE) 20 MG tablet Take 1 tablet (20 mg total) by mouth daily with breakfast.  . Respiratory Therapy Supplies (FLUTTER) DEVI Use as directed  . traZODone (DESYREL) 50 MG tablet TAKE 1 AND 1/2 TABLET AT BEDTIME AS NEEDED FOR SLEEP  . TUSSIN DM 100-10 MG/5ML liquid TAKE 1 TEASPOONFUL BY MOUTH EVERY 4 HOURS AS NEEDED FOR COUGH (Patient not taking: Reported on 03/18/2020)  . vitamin E 1000 UNIT capsule Take 1,000 Units by mouth daily.   No facility-administered encounter medications on file as of 03/25/2020.     Objective:  BP Readings from Last 3  Encounters:  03/18/20 (!) 140/58  03/05/20 (!) 172/72  01/24/20 (!) 152/71   Lab Results  Component Value Date   HGBA1C 7.9 (H) 04/05/2019     Goals Addressed            This Visit's Progress   . Chronic Disease Management        Current Barriers:  . Chronic Disease Management support and education needs related to Hypertension, Diabetes and COPD.   Case Manager Clinical Goal(s):  Marland Kitchen Over the next 90 days, patient will not be hospitalized for complications related to chronic illnesses. . Over the next 90 days, patient will take  all medications as prescribed. (Caregiver to assist) . Over the next 90 days, patient will attend all scheduled medical appointments.(Caregiver to assist) . Over the next 90 days, patient will monitor blood glucose levels daily and record readings. (Caregiver to assist) . Over the next 90 days, patient will monitor blood pressure. (Caregiver to assist) . Over the next 90 days, patient will follow recommended home safety measures to prevent falls.  Interventions:   . Discussed plans for ongoing care management follow up. Discussed updates with [name]'s daughter [name]. Medications are being prepared in compliance packages by [facility]. Reports [name] is attempting to take medications as prescribed. Her vision is impaired and she has missed doses d/t unknowingly dropping some of the pills. She is pending outreach with the [facility] staff. Will follow-up to determine if additional services  . Discussed home safety measures and long-term plans for in-home assistance. Reports [name] continues to live alone and can perform most self-care activities independently but may need in-home assistance at some point. A list of available agencies providing personal care services was forwarded to Tammy to review.   Patient Self Care Activities:  . Performs ADL's independently . Requires assistance with IADLs and medication preparation.   Please see past updates related to this goal by clicking on the "Past Updates" button in the selected goal            PLAN The care management team will follow-up next month.    Horris Latino [facility] [phone]

## 2020-03-28 ENCOUNTER — Telehealth: Payer: Self-pay | Admitting: Internal Medicine

## 2020-03-28 NOTE — Telephone Encounter (Signed)
Called and spoke to pt's daughter, [name] ([place]).  Tammy stated that pt woke up at 2:00 this morning with shortness of breath and pain from middle of back into the middle of chest. Cough, wheezing and sob is baseline. Cough is non prod.  Pt denied vomiting, nausea or arm discomfort.   Dr. Patsey Berthold please advise as Dr. Mortimer Fries is unavailable.

## 2020-03-28 NOTE — Telephone Encounter (Signed)
Pt's daughter, [name] [facility]) is aware of below recommendations and voiced her understanding.  Nothing further is needed.

## 2020-03-28 NOTE — Telephone Encounter (Signed)
Should be evaluated at either urgent care or emergency room as it appears that she is going to need imaging of the chest with at least a chest x-ray.

## 2020-03-30 ENCOUNTER — Emergency Department
Admission: EM | Admit: 2020-03-30 | Discharge: 2020-03-31 | Disposition: A | Discharge status: Home / Self Care | Payer: Medicare Other | Attending: Emergency Medicine | Admitting: Emergency Medicine

## 2020-03-30 ENCOUNTER — Other Ambulatory Visit: Payer: Self-pay

## 2020-03-30 ENCOUNTER — Emergency Department: Payer: Medicare Other

## 2020-03-30 DIAGNOSIS — R0602 Shortness of breath: Secondary | ICD-10-CM | POA: Diagnosis not present

## 2020-03-30 DIAGNOSIS — R069 Unspecified abnormalities of breathing: Secondary | ICD-10-CM | POA: Diagnosis not present

## 2020-03-30 DIAGNOSIS — Z79899 Other long term (current) drug therapy: Secondary | ICD-10-CM | POA: Insufficient documentation

## 2020-03-30 DIAGNOSIS — Z7951 Long term (current) use of inhaled steroids: Secondary | ICD-10-CM | POA: Diagnosis not present

## 2020-03-30 DIAGNOSIS — Z743 Need for continuous supervision: Secondary | ICD-10-CM | POA: Diagnosis not present

## 2020-03-30 DIAGNOSIS — Z7982 Long term (current) use of aspirin: Secondary | ICD-10-CM | POA: Diagnosis not present

## 2020-03-30 DIAGNOSIS — J449 Chronic obstructive pulmonary disease, unspecified: Secondary | ICD-10-CM | POA: Diagnosis not present

## 2020-03-30 DIAGNOSIS — Z91040 Latex allergy status: Secondary | ICD-10-CM | POA: Insufficient documentation

## 2020-03-30 DIAGNOSIS — Z85828 Personal history of other malignant neoplasm of skin: Secondary | ICD-10-CM | POA: Diagnosis not present

## 2020-03-30 DIAGNOSIS — Z794 Long term (current) use of insulin: Secondary | ICD-10-CM | POA: Diagnosis not present

## 2020-03-30 DIAGNOSIS — Z20822 Contact with and (suspected) exposure to covid-19: Secondary | ICD-10-CM | POA: Diagnosis not present

## 2020-03-30 DIAGNOSIS — R000 Tachycardia, unspecified: Secondary | ICD-10-CM | POA: Diagnosis not present

## 2020-03-30 DIAGNOSIS — E1140 Type 2 diabetes mellitus with diabetic neuropathy, unspecified: Secondary | ICD-10-CM | POA: Diagnosis not present

## 2020-03-30 DIAGNOSIS — R21 Rash and other nonspecific skin eruption: Secondary | ICD-10-CM | POA: Diagnosis not present

## 2020-03-30 DIAGNOSIS — R079 Chest pain, unspecified: Secondary | ICD-10-CM | POA: Diagnosis not present

## 2020-03-30 DIAGNOSIS — R0789 Other chest pain: Secondary | ICD-10-CM | POA: Diagnosis not present

## 2020-03-30 DIAGNOSIS — I10 Essential (primary) hypertension: Secondary | ICD-10-CM | POA: Insufficient documentation

## 2020-03-30 DIAGNOSIS — E1165 Type 2 diabetes mellitus with hyperglycemia: Secondary | ICD-10-CM | POA: Diagnosis not present

## 2020-03-30 LAB — CBC WITH DIFFERENTIAL/PLATELET
Abs Immature Granulocytes: 0.03 10*3/uL (ref 0.00–0.07)
Basophils Absolute: 0.1 10*3/uL (ref 0.0–0.1)
Basophils Relative: 1 %
Eosinophils Absolute: 0.6 10*3/uL — ABNORMAL HIGH (ref 0.0–0.5)
Eosinophils Relative: 7 %
HCT: 30.8 % — ABNORMAL LOW (ref 36.0–46.0)
Hemoglobin: 9.9 g/dL — ABNORMAL LOW (ref 12.0–15.0)
Immature Granulocytes: 0 %
Lymphocytes Relative: 25 %
Lymphs Abs: 2.1 10*3/uL (ref 0.7–4.0)
MCH: 26.6 pg (ref 26.0–34.0)
MCHC: 32.1 g/dL (ref 30.0–36.0)
MCV: 82.8 fL (ref 80.0–100.0)
Monocytes Absolute: 1.3 10*3/uL — ABNORMAL HIGH (ref 0.1–1.0)
Monocytes Relative: 15 %
Neutro Abs: 4.3 10*3/uL (ref 1.7–7.7)
Neutrophils Relative %: 52 %
Platelets: 278 10*3/uL (ref 150–400)
RBC: 3.72 MIL/uL — ABNORMAL LOW (ref 3.87–5.11)
RDW: 13.5 % (ref 11.5–15.5)
WBC: 8.4 10*3/uL (ref 4.0–10.5)
nRBC: 0 % (ref 0.0–0.2)

## 2020-03-30 NOTE — ED Triage Notes (Signed)
Pt BIB EMS from home with chest pain and SOB that started on Friday, that comes and goes. EMS reported pt taking albuterol inhaler PTA. Pt is now reporting a headache.

## 2020-03-31 ENCOUNTER — Encounter: Payer: Self-pay | Admitting: Radiology

## 2020-03-31 ENCOUNTER — Emergency Department: Payer: Medicare Other

## 2020-03-31 DIAGNOSIS — Z20822 Contact with and (suspected) exposure to covid-19: Secondary | ICD-10-CM | POA: Diagnosis not present

## 2020-03-31 DIAGNOSIS — R0602 Shortness of breath: Secondary | ICD-10-CM | POA: Diagnosis not present

## 2020-03-31 DIAGNOSIS — R0789 Other chest pain: Secondary | ICD-10-CM | POA: Diagnosis not present

## 2020-03-31 DIAGNOSIS — R000 Tachycardia, unspecified: Secondary | ICD-10-CM | POA: Diagnosis not present

## 2020-03-31 LAB — COMPREHENSIVE METABOLIC PANEL
ALT: 15 U/L (ref 0–44)
AST: 19 U/L (ref 15–41)
Albumin: 3.3 g/dL — ABNORMAL LOW (ref 3.5–5.0)
Alkaline Phosphatase: 94 U/L (ref 38–126)
Anion gap: 10 (ref 5–15)
BUN: 17 mg/dL (ref 8–23)
CO2: 27 mmol/L (ref 22–32)
Calcium: 8.5 mg/dL — ABNORMAL LOW (ref 8.9–10.3)
Chloride: 98 mmol/L (ref 98–111)
Creatinine, Ser: 1.12 mg/dL — ABNORMAL HIGH (ref 0.44–1.00)
GFR calc Af Amer: 50 mL/min — ABNORMAL LOW (ref >60)
GFR calc non Af Amer: 44 mL/min — ABNORMAL LOW (ref >60)
Glucose, Bld: 257 mg/dL — ABNORMAL HIGH (ref 70–99)
Potassium: 4.1 mmol/L (ref 3.5–5.1)
Sodium: 135 mmol/L (ref 135–145)
Total Bilirubin: 0.6 mg/dL (ref 0.3–1.2)
Total Protein: 6.4 g/dL — ABNORMAL LOW (ref 6.5–8.1)

## 2020-03-31 LAB — SARS CORONAVIRUS 2 BY RT PCR (HOSPITAL ORDER, PERFORMED IN ~~LOC~~ HOSPITAL LAB): SARS Coronavirus 2: NEGATIVE

## 2020-03-31 LAB — TROPONIN I (HIGH SENSITIVITY)
Troponin I (High Sensitivity): 12 ng/L (ref <18)
Troponin I (High Sensitivity): 15 ng/L (ref <18)

## 2020-03-31 LAB — BRAIN NATRIURETIC PEPTIDE: B Natriuretic Peptide: 93.6 pg/mL (ref 0.0–100.0)

## 2020-03-31 LAB — MAGNESIUM: Magnesium: 1.8 mg/dL (ref 1.7–2.4)

## 2020-03-31 MED ORDER — SODIUM CHLORIDE 0.9 % IV BOLUS
500.0000 mL | Freq: Once | INTRAVENOUS | Status: AC
  Administered 2020-03-31: 500 mL via INTRAVENOUS

## 2020-03-31 MED ORDER — IOHEXOL 350 MG/ML SOLN
60.0000 mL | Freq: Once | INTRAVENOUS | Status: AC | PRN
  Administered 2020-03-31: 60 mL via INTRAVENOUS

## 2020-03-31 NOTE — ED Provider Notes (Signed)
[facility] Emergency Department Provider Note  ____________________________________________   First MD Initiated Contact with Patient 03/30/20 2339     (approximate)  I have reviewed the triage vital signs and the nursing notes.   HISTORY  Chief Complaint Chest Pain    HPI WHITNI PASQUINI is a 86 y.o. female with medical history as listed below which notably includes COPD with no oxygen requirement who presents  by EMS from home for evaluation of episodic chest pain and shortness of breath for the last 2 days.  She has had similar symptoms in the past but it has been milder.  She said the 2 days ago woke her up from sleep but eventually went away.  Tonight when she was going to bed she developed the chest pain which is central, heavy, occasionally sharp and seems to be radiating from her back into her chest.  It is accompanied with shortness of breath.  The symptoms are worse when she lies down flat and better at rest or when she sits up straight.  She denies fever, sore throat, nausea, vomiting, and abdominal pain.  She has COPD but no prior smoking history and does not require supplemental oxygen at baseline.  She still feels some of the pain now but it is better than it was before she got to the hospital.  Her cardiologist is Dr. Nehemiah Massed.  She has had stents in her legs and a vascular surgery procedure in her abdomen.  Medical history indicates that she is on Plavix but no other blood thinner.        Past Medical History:  Diagnosis Date  . Angina pectoris ([place])   . Atrial fibrillation ([place])   . Basal cell carcinoma    nose  . Cervicalgia   . Chronic back pain   . Chronic obstructive pulmonary disease (COPD) ([place])   . COPD (chronic obstructive pulmonary disease) ([place])   . DDD (degenerative disc disease), lumbar   . Diabetes mellitus without complication ([place])   . Dysphagia   . Hyperlipemia   . Hypertension   . Squamous cell carcinoma of skin     left lateral pretibial  . Vulvovaginitis     Patient Active Problem List   Diagnosis Date Noted  . Celiac artery stenosis ([place]) 06/12/2019  . Bilateral lower abdominal cramping 04/20/2019  . UTI symptoms 04/20/2019  . History of rectal bleeding 03/26/2019  . DM type 2 with diabetic peripheral neuropathy ([place]) 12/14/2018  . Arthropathy of lumbar facet joint 09/04/2018  . Degeneration of lumbar intervertebral disc 09/04/2018  . Spondylolisthesis, grade 1 09/04/2018  . Lewy body dementia ([place]) 02/17/2018  . PVD (peripheral vascular disease) ([place]) 08/16/2017  . Pain in limb 07/27/2016  . Hallucinations 07/07/2016  . Chest pain 07/01/2016  . Depression 06/23/2016  . Hypokalemia 09/03/2015  . Insomnia 07/23/2015  . Headache 07/23/2015  . Type 2 diabetes mellitus with vascular disease ([place]) 06/24/2015  . Chronic vulvitis 06/02/2015  . Allergic rhinitis 05/30/2015  . Back ache 05/30/2015  . Body mass index (BMI) of 29.0-29.9 in adult 05/30/2015  . Edema 05/30/2015  . Dilatation of esophagus 05/30/2015  . Fall 05/30/2015  . H/O deep venous thrombosis 05/30/2015  . Hypercholesteremia 05/30/2015  . Heart & renal disease, hypertensive, with heart failure ([place]) 05/30/2015  . Below normal amount of sodium in the blood 05/30/2015  . Calculus of kidney 05/30/2015  . Gonalgia 05/30/2015  . Psoriasis 05/30/2015  . Avitaminosis D 05/30/2015  . Cough  04/01/2015  . Combined fat and carbohydrate induced hyperlipemia 12/30/2014  . Paroxysmal atrial fibrillation ([place]) 07/10/2014  . Abnormal gait 07/08/2014  . Anxiety state 07/08/2014  . Chronic kidney disease 07/08/2014  . Acid reflux 07/08/2014  . 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[place]) 07/08/2014  . Chronic obstructive pulmonary disease ([place]) 07/08/2014  . Type II diabetes mellitus with renal manifestations ([place]) 07/08/2014  . Benign essential HTN 06/26/2014  . Carotid artery narrowing 06/26/2014  . Myalgia 06/26/2014  . Basal cell  carcinoma of face 05/01/2014  . Disease of female genital organs 11/28/2012  . Incomplete bladder emptying 09/05/2012  . Excessive urination at night 08/15/2012  . Basal cell carcinoma of ear 10/26/2011    Past Surgical History:  Procedure Laterality Date  . gall bladder    . melanoma     removal neck and back  . PERIPHERAL VASCULAR CATHETERIZATION Left 01/05/2016   Procedure: Lower Extremity Angiography;  Surgeon: Algernon Huxley, MD;  Location: [place] [facility];  Service: Cardiovascular;  Laterality: Left;  . PERIPHERAL VASCULAR CATHETERIZATION  01/05/2016   Procedure: Lower Extremity Intervention;  Surgeon: Algernon Huxley, MD;  Location: [place] [facility];  Service: Cardiovascular;;  . ureterolithiasis     calculus removed  . VAGINAL HYSTERECTOMY    . VISCERAL ANGIOGRAPHY N/A 05/10/2019   Procedure: VISCERAL ANGIOGRAPHY;  Surgeon: Algernon Huxley, MD;  Location: [place] [facility];  Service: Cardiovascular;  Laterality: N/A;  . VULVA / PERINEUM BIOPSY  05/29/2015    Prior to Admission medications   Medication Sig Start Date End Date Taking? Authorizing Provider  albuterol (VENTOLIN HFA) 108 (90 Base) MCG/ACT inhaler INHALE 2 PUFFS INTO THE LUNGS EVERY 4 HOURS AS NEEDED FOR WHEEZING ORSHORTNESS OF BREATH 07/04/19   Flora Lipps, MD  Ascorbic Acid (VITAMIN C) 1000 MG tablet Take 1,000 mg by mouth daily.     [provider]  aspirin 325 MG tablet Take 325 mg by mouth daily. Reported on 01/05/2016    [provider]  atorvastatin (LIPITOR) 10 MG tablet Take 1 tablet (10 mg total) by mouth daily. 06/20/19 06/19/20  Jerrol Banana., MD  cetirizine (ZYRTEC) 10 MG tablet TAKE ONE TABLET BY MOUTH EVERY DAY 12/12/18   Jerrol Banana., MD  cholecalciferol (VITAMIN D3) 25 MCG (1000 UT) tablet Take 1,000 Units by mouth daily.    [provider]  clopidogrel (PLAVIX) 75 MG tablet Take 1 tablet (75 mg total) by mouth daily. 05/10/19   Algernon Huxley, MD  diltiazem  [facility]) 360 MG 24 hr capsule TAKE 1 CAPSULE EVERY DAY 10/03/19   Jerrol Banana., MD  ezetimibe (ZETIA) 10 MG tablet Take 1 tablet (10 mg total) by mouth daily. 04/06/19   Jerrol Banana., MD  FLUoxetine (PROZAC) 20 MG capsule Take 1 capsule (20 mg total) by mouth daily. 01/24/20   Flinchum, Kelby Aline, FNP  Fluticasone-Salmeterol (ADVAIR DISKUS) 250-50 MCG/DOSE AEPB Inhale 1 puff into the lungs 2 (two) times daily. 11/21/19 11/20/20  Flora Lipps, MD  glucose blood test strip ACCU-CHEK AVIVA PLUS TEST STRP Use to check sugars 3 times daily. 10/28/15   [provider]  insulin glargine (LANTUS) 100 UNIT/ML injection Inject 0.05 mLs (5 Units total) into the skin at bedtime. Patient taking differently: Inject 10 Units into the skin at bedtime.  07/02/16   Epifanio Lesches, MD  insulin lispro (HUMALOG KWIKPEN) 100 UNIT/ML KiwkPen Inject 10 Units into the skin 4 (four) times daily.  [provider]  lansoprazole (PREVACID) 30 MG capsule TAKE 1 CAPSULE BY MOUTH DAILY AT NOON 02/26/20   Jerrol Banana., MD  losartan (COZAAR) 100 MG tablet TAKE ONE TABLET EVERY DAY 09/17/19   Jerrol Banana., MD  Magnesium 400 MG CAPS Take by mouth daily.    [provider]  meloxicam (MOBIC) 7.5 MG tablet Take 1 tablet (7.5 mg total) by mouth daily as needed for pain. 03/18/20   Jerrol Banana., MD  memantine [facility]) 5 MG tablet TAKE ONE TABLET TWICE DAILY 09/29/19   Jerrol Banana., MD  montelukast (SINGULAIR) 10 MG tablet TAKE 1 TABLET BY MOUTH AT BEDTIME 06/06/19   Jerrol Banana., MD  potassium chloride (KLOR-CON) 10 MEQ tablet TAKE 1 TABLET BY MOUTH TWICE DAILY 01/09/20   Jerrol Banana., MD  predniSONE (DELTASONE) 20 MG tablet Take 1 tablet (20 mg total) by mouth daily with breakfast. 10/01/19   Flora Lipps, MD  Respiratory Therapy Supplies (FLUTTER) DEVI Use as directed 07/04/19   Flora Lipps, MD  traZODone (DESYREL) 50 MG tablet TAKE 1 AND  1/2 TABLET AT BEDTIME AS NEEDED FOR SLEEP 09/29/19   Jerrol Banana., MD  TUSSIN DM 100-10 MG/5ML liquid TAKE 1 TEASPOONFUL BY MOUTH EVERY 4 HOURS AS NEEDED FOR COUGH Patient not taking: Reported on 03/18/2020 08/20/19   Flora Lipps, MD  vitamin E 1000 UNIT capsule Take 1,000 Units by mouth daily.    [provider]    Allergies Bacitracin-neomycin-polymyxin, Neomycin-bacitracin zn-polymyx, Latex, Lidocaine, Aricept [donepezil hcl], Benzalkonium chloride, Ibuprofen, Valdecoxib, Albuterol, Tape, and Triamcinolone  Family History  Problem Relation Age of Onset  . Ovarian cancer Other   . Diabetes Sister   . Colon cancer Brother   . Diabetes Brother   . Atrial fibrillation Daughter   . Breast cancer Neg Hx     Social History Social History   Tobacco Use  . Smoking status: Never Smoker  . Smokeless tobacco: Never Used  Vaping Use  . Vaping Use: Never used  Substance Use Topics  . Alcohol use: No  . Drug use: No    Review of Systems Constitutional: No fever/chills Eyes: No visual changes. ENT: No sore throat. Cardiovascular: +chest pain. Respiratory: +shortness of breath. Gastrointestinal: No abdominal pain.  No nausea, no vomiting.  No diarrhea.  No constipation. Genitourinary: Negative for dysuria. Musculoskeletal: Negative for neck pain.  Negative for back pain. Integumentary: Negative for rash. Neurological: Negative for headaches, focal weakness or numbness.   ____________________________________________   PHYSICAL EXAM:  VITAL SIGNS: ED Triage Vitals  Enc Vitals Group     BP 03/30/20 2340 (!) 175/83     Pulse Rate 03/30/20 2340 (!) 104     Resp 03/30/20 2340 13     Temp 03/30/20 2340 98.2 F (36.8 C)     Temp src --      SpO2 03/30/20 2340 97 %     Weight 03/30/20 2341 66.2 kg (146 lb)     Height 03/30/20 2341 1.6 m (5\' 3" )     Head Circumference --      Peak Flow --      Pain Score 03/30/20 2341 7     Pain Loc --      Pain Edu? --       Excl. in [place]? --     Constitutional: Alert and oriented.  Eyes: Conjunctivae are normal.  Head: Atraumatic. Nose: No congestion/rhinnorhea. Mouth/Throat: Patient  is wearing a mask. Neck: No stridor.  No meningeal signs.   Cardiovascular: Mild tachycardia, regular rhythm. Good peripheral circulation. Grossly normal heart sounds. Respiratory: Normal respiratory effort.  No retractions.  Clear breath sounds. Gastrointestinal: Soft and nontender. No distention.  Musculoskeletal: No lower extremity tenderness nor edema. No gross deformities of extremities. Neurologic:  Normal speech and language. No gross focal neurologic deficits are appreciated.  Skin:  Skin is warm, dry and intact. Psychiatric: Mood and affect are normal. Speech and behavior are normal.  ____________________________________________   LABS (all labs ordered are listed, but only abnormal results are displayed)  Labs Reviewed  COMPREHENSIVE METABOLIC PANEL - Abnormal; Notable for the following components:      Result Value   Glucose, Bld 257 (*)    Creatinine, Ser 1.12 (*)    Calcium 8.5 (*)    Total Protein 6.4 (*)    Albumin 3.3 (*)    GFR calc non Af Amer 44 (*)    GFR calc Af Amer 50 (*)    All other components within normal limits  CBC WITH DIFFERENTIAL/PLATELET - Abnormal; Notable for the following components:   RBC 3.72 (*)    Hemoglobin 9.9 (*)    HCT 30.8 (*)    Monocytes Absolute 1.3 (*)    Eosinophils Absolute 0.6 (*)    All other components within normal limits  SARS CORONAVIRUS 2 BY RT PCR (HOSPITAL ORDER, [place] [facility])  BRAIN NATRIURETIC PEPTIDE  MAGNESIUM  TROPONIN I (HIGH SENSITIVITY)  TROPONIN I (HIGH SENSITIVITY)   ____________________________________________  EKG  ED ECG REPORT I, Hinda Kehr, the attending physician, personally viewed and interpreted this ECG.  Date: 03/30/2020 EKG Time: 23:42 Rate: 104 Rhythm: Sinus tachycardia QRS Axis:  normal Intervals: normal ST/T Wave abnormalities: normal Narrative Interpretation: no evidence of acute ischemia  ____________________________________________  RADIOLOGY I, Hinda Kehr, personally viewed and evaluated these images (plain radiographs) as part of my medical decision making, as well as reviewing the written report by the radiologist.  ED MD interpretation: No acute abnormalities on chest x-ray  Official radiology report(s): CT Angio Chest PE W/Cm &/Or Wo Cm  Result Date: 03/31/2020 CLINICAL DATA:  Chest pain and shortness of breath which began Friday, intermittent, headache began after taking albuterol EXAM: CT ANGIOGRAPHY CHEST WITH CONTRAST TECHNIQUE: Multidetector CT imaging of the chest was performed using the standard protocol during bolus administration of intravenous contrast. Multiplanar CT image reconstructions and MIPs were obtained to evaluate the vascular anatomy. CONTRAST:  40mL OMNIPAQUE IOHEXOL 350 MG/ML SOLN COMPARISON:  CT 07/01/2016 FINDINGS: Cardiovascular: Satisfactory opacification the pulmonary arteries to the segmental level. No pulmonary artery filling defects are identified. Central pulmonary arteries are normal caliber. Atherosclerotic plaque within the normal caliber aorta. No acute luminal abnormality. No periaortic stranding or hemorrhage. Shared origin of brachiocephalic and left common carotid artery. Extensive plaque present in the proximal great vessels including some likely moderate stenosis at the right subclavian artery origin. No major venous abnormality. Mild cardiomegaly with predominantly right atrial enlargement. No pericardial effusion. Extensive three-vessel coronary artery calcifications are present. Calcifications also noted on the aortic leaflets. Mediastinum/Nodes: No mediastinal fluid or gas. Normal thyroid gland and thoracic inlet. No acute abnormality of the trachea or esophagus. No worrisome mediastinal, hilar or axillary adenopathy.  Lungs/Pleura: There are diffuse areas of mixed mosaic attenuation throughout the lungs with interspersed areas of normal parenchyma and ground-glass opacity as well as superimposed interlobular septal thickening and fissural thickening. There are several  areas demonstrating some more patchy airspace opacity as well. Pulmonary vascularity is redistributed. No pneumothorax. No visible effusion. Upper Abdomen: No focal liver abnormality is seen. Patient is post cholecystectomy. Slight prominence of the biliary tree likely related to reservoir effect. No calcified intraductal gallstones. Extensive upper abdominal atherosclerosis. Musculoskeletal: Multilevel degenerative changes are present in the imaged portions of the spine. Endplate spurring and disc bulge at T7-8 results in some mild canal stenosis. Vertebral body hemangiomata in including T11, T12, T3. No worrisome osseous lesions. Review of the MIP images confirms the above findings. IMPRESSION: 1. No evidence of pulmonary embolism. 2. Mosaic attenuation throughout the lungs with interspersed normal parenchyma, ground-glass opacity and few scattered consolidative foci with interlobular septal thickening and fissural thickening and redistributed vascularity. Findings are favored to represent a combination of pulmonary edema and atelectasis. More consolidative opacities could suggest some developing alveolar edema though infection, particularly atypical infections including COVID-19, could present similarly. 3. Extensive three-vessel coronary artery calcifications. Coronary artery calcifications are present. Please note that the presence of coronary artery calcium documents the presence of coronary artery disease, the severity of this disease and any potential stenosis cannot be assessed on this non-gated CT examination. 4. Mild cardiomegaly with predominantly right atrial enlargement. 5. Aortic Atherosclerosis (ICD10-I70.0). Electronically Signed   By: Lovena Le  M.D.   On: 03/31/2020 01:43   DG Chest Portable 1 View  Result Date: 03/30/2020 CLINICAL DATA:  86 year old female with shortness of breath. EXAM: PORTABLE CHEST 1 VIEW COMPARISON:  Chest radiograph dated 01/18/2020. FINDINGS: Mild diffuse interstitial coarsening, likely chronic. No focal consolidation, pleural effusion, or pneumothorax. The cardiac silhouette is within normal limits. Atherosclerotic calcification of the aorta. No acute osseous pathology. Right upper quadrant cholecystectomy clips. IMPRESSION: No active disease. Electronically Signed   By: Anner Crete M.D.   On: 03/30/2020 23:51    ____________________________________________   PROCEDURES   Procedure(s) performed (including Critical Care):  .1-3 Lead EKG Interpretation Performed by: Hinda Kehr, MD Authorized by: Hinda Kehr, MD     Interpretation: abnormal     ECG rate:  108   ECG rate assessment: tachycardic     Rhythm: sinus tachycardia     Ectopy: none     Conduction: normal       ____________________________________________   INITIAL IMPRESSION / MDM / ASSESSMENT AND PLAN / ED COURSE  As part of my medical decision making, I reviewed the following data within the [place] History obtained from family, Nursing notes reviewed and incorporated, Labs reviewed , EKG interpreted , Old chart reviewed, Radiograph reviewed  and Notes from prior ED visits   Differential diagnosis includes, but is not limited to, angina, ACS, PE, AAS, pneumonia, COPD exacerbation, new onset heart failure, viral illness.  The patient is on the cardiac monitor to evaluate for evidence of arrhythmia and/or significant heart rate changes.  EKG is nonischemic.  The patient is tachycardic at rest but not hypoxemic.  She has no fever.  She is demonstrating some hypertension.  She is currently breathing comfortably and easily with clear breath sounds and able to speak in full sentences without any difficulty.   Shortness of breath seems more related to the chest pain that she says feels like is radiating from her back to her front.  I will follow up on the lab work that is sent but strongly consider CTA chest to rule out pulmonary embolism as well as to look at her aorta although my primary concern would be  PE.  Given her comorbidities, ACS is also certainly possible even with a generally reassuring EKG.  I will reassess after following up on the labs.     Clinical Course as of Mar 31 346  Mon Mar 31, 2020  0028 Mildly elevated from baseline but not of great concern at the moment but will need to be repeated.  However this is similar to the last value in the system of 13.  Troponin I (High Sensitivity): 12 [CF]  0029 B Natriuretic Peptide: 93.6 [CF]  0029 Generally reassuring comprehensive metabolic panel, adequate renal function with no evidence of acute kidney injury or acute renal failure.  Comprehensive metabolic panel(!) [CF]  6659 Proceeding with CTA chest given the patient's chest pain, shortness of breath, and tachycardia.  Also ordering 500 mL of normal saline IV bolus.   [CF]  0129 SARS Coronavirus 2: NEGATIVE [CF]  9357 The patient's work-up has generally been reassuring.  There is no evidence of pulmonary embolism on the CT scan.  She has some consolidations that did not look exactly infectious but also did not look exactly like pulmonary edema.  Her symptoms are most consistent with some degree of heart failure (particularly given the orthopnea) but I reviewed the medical record and I cannot find an echocardiogram.The patient's daughter is now at bedside.  She is very comfortable with discharge home and outpatient follow-up if the second troponin is unchanged.  She said that she personally will call Dr. Alveria Apley office to set up a follow-up appoint with cardiology.  I think this is appropriate.  I offered admission but both the patient and her daughter declined.   [CF]  0345 Essentially  unchanged troponin, daughter is very eager to take her home, I think this is appropriate.  Patient is asymptomatic and in good spirits.  I gave my usual and customary discharge instructions and follow-up recommendations.  Troponin I (High Sensitivity): 15 [CF]    Clinical Course User Index [CF] Hinda Kehr, MD     ____________________________________________  FINAL CLINICAL IMPRESSION(S) / ED DIAGNOSES  Final diagnoses:  Chest pain, unspecified type     MEDICATIONS GIVEN DURING THIS VISIT:  Medications  sodium chloride 0.9 % bolus 500 mL (0 mLs Intravenous Stopped 03/31/20 0300)  iohexol (OMNIPAQUE) 350 MG/ML injection 60 mL (60 mLs Intravenous Contrast Given 03/31/20 0109)     ED Discharge Orders    None      *Please note:  MARGUERITA STAPP was evaluated in Emergency Department on 03/31/2020 for the symptoms described in the history of present illness. She was evaluated in the context of the global COVID-19 pandemic, which necessitated consideration that the patient might be at risk for infection with the SARS-CoV-2 virus that causes COVID-19. Institutional protocols and algorithms that pertain to the evaluation of patients at risk for COVID-19 are in a state of rapid change based on information released by regulatory bodies including the CDC and federal and state organizations. These policies and algorithms were followed during the patient's care in the ED.  Some ED evaluations and interventions may be delayed as a result of limited staffing during and after the pandemic.*  Note:  This document was prepared using Dragon voice recognition software and may include unintentional dictation errors.   Hinda Kehr, MD 03/31/20 [id number]

## 2020-03-31 NOTE — ED Notes (Addendum)
Pts pure wick did not suction urine. Urine got on bed. Pt cleaned, dry linen on bed and pure wick replaced and pt placed on depends and chux. Daughter at bedside.

## 2020-03-31 NOTE — ED Notes (Signed)
Daughter signed pts discharge paperwork, with pts permission

## 2020-03-31 NOTE — Discharge Instructions (Signed)

## 2020-04-01 NOTE — Telephone Encounter (Signed)
Referral has been placed. 

## 2020-04-13 NOTE — Patient Instructions (Signed)
Thank you for allowing the Chronic Care Management team to participate in your care.  Goals Addressed            This Visit's Progress   . Chronic Disease Management        Current Barriers:  . Chronic Disease Management support and education needs related to Hypertension, Diabetes and COPD.   Case Manager Clinical Goal(s):  Marland Kitchen Over the next 90 days, patient will not be hospitalized for complications related to chronic illnesses. . Over the next 90 days, patient will take all medications as prescribed. (Caregiver to assist) . Over the next 90 days, patient will attend all scheduled medical appointments.(Caregiver to assist) . Over the next 90 days, patient will monitor blood glucose levels daily and record readings. (Caregiver to assist) . Over the next 90 days, patient will monitor blood pressure. (Caregiver to assist) . Over the next 90 days, patient will follow recommended home safety measures to prevent falls.  Interventions:   . Discussed plans for ongoing care management follow up. Discussed updates with Ms. Burack's daughter [name]. Medications are being prepared in compliance packages by [facility] pharmacy. Reports Ms. Rens is attempting to take medications as prescribed. Her vision is impaired and she has missed doses d/t unknowingly dropping some of the pills. She is pending outreach with the [facility] staff. Will follow-up to determine if additional services are needed. . Discussed home safety measures and long-term plans for in-home assistance. Reports Ms. Balthazar continues to live alone and can perform most self-care activities independently but may need in-home assistance at some point. A list of available agencies providing personal care services was forwarded to Tammy to review.   Patient Self Care Activities:  . Performs ADL's independently . Requires assistance with IADLs and medication preparation.   Please see past updates related to this goal by  clicking on the "Past Updates" button in the selected goal        Ms. Conrey's daughter Tammy verbalized understanding of the information discussed during the telephonic outreach today. Declined need for a printed copy of the instructions.   The care management team will follow-up next month.   [facility] Management [phone]

## 2020-04-17 DIAGNOSIS — I25118 Atherosclerotic heart disease of native coronary artery with other forms of angina pectoris: Secondary | ICD-10-CM | POA: Diagnosis not present

## 2020-04-17 DIAGNOSIS — E782 Mixed hyperlipidemia: Secondary | ICD-10-CM | POA: Diagnosis not present

## 2020-04-17 DIAGNOSIS — I2510 Atherosclerotic heart disease of native coronary artery without angina pectoris: Secondary | ICD-10-CM | POA: Insufficient documentation

## 2020-04-17 DIAGNOSIS — I6523 Occlusion and stenosis of bilateral carotid arteries: Secondary | ICD-10-CM | POA: Diagnosis not present

## 2020-04-17 DIAGNOSIS — I10 Essential (primary) hypertension: Secondary | ICD-10-CM | POA: Diagnosis not present

## 2020-04-17 DIAGNOSIS — I700 Atherosclerosis of aorta: Secondary | ICD-10-CM | POA: Diagnosis not present

## 2020-04-21 NOTE — Progress Notes (Deleted)
Established patient visit   Patient: [name]   DOB: 10/18/1928   86 y.o. Female  MRN: [id number] Visit Date: 04/24/2020  Today's healthcare provider: Wilhemena Durie, MD   No chief complaint on file.  Subjective    HPI  ***  {Show patient history (optional):23778::" "}   Medications: Outpatient Medications Prior to Visit  Medication Sig  . albuterol (VENTOLIN HFA) 108 (90 Base) MCG/ACT inhaler INHALE 2 PUFFS INTO THE LUNGS EVERY 4 HOURS AS NEEDED FOR WHEEZING ORSHORTNESS OF BREATH  . Ascorbic Acid (VITAMIN C) 1000 MG tablet Take 1,000 mg by mouth daily.   Marland Kitchen aspirin 325 MG tablet Take 325 mg by mouth daily. Reported on 01/05/2016  . atorvastatin (LIPITOR) 10 MG tablet Take 1 tablet (10 mg total) by mouth daily.  . cetirizine (ZYRTEC) 10 MG tablet TAKE ONE TABLET BY MOUTH EVERY DAY  . cholecalciferol (VITAMIN D3) 25 MCG (1000 UT) tablet Take 1,000 Units by mouth daily.  . clopidogrel (PLAVIX) 75 MG tablet Take 1 tablet (75 mg total) by mouth daily.  Marland Kitchen diltiazem (TIAZAC) 360 MG 24 hr capsule TAKE 1 CAPSULE EVERY DAY  . ezetimibe (ZETIA) 10 MG tablet Take 1 tablet (10 mg total) by mouth daily.  Marland Kitchen FLUoxetine (PROZAC) 20 MG capsule Take 1 capsule (20 mg total) by mouth daily.  . Fluticasone-Salmeterol (ADVAIR DISKUS) 250-50 MCG/DOSE AEPB Inhale 1 puff into the lungs 2 (two) times daily.  Marland Kitchen glucose blood test strip ACCU-CHEK AVIVA PLUS TEST STRP Use to check sugars 3 times daily.  . insulin glargine (LANTUS) 100 UNIT/ML injection Inject 0.05 mLs (5 Units total) into the skin at bedtime. (Patient taking differently: Inject 10 Units into the skin at bedtime. )  . insulin lispro (HUMALOG KWIKPEN) 100 UNIT/ML KiwkPen Inject 10 Units into the skin 4 (four) times daily.   . lansoprazole (PREVACID) 30 MG capsule TAKE 1 CAPSULE BY MOUTH DAILY AT NOON  . losartan (COZAAR) 100 MG tablet TAKE ONE TABLET EVERY DAY  . Magnesium 400 MG CAPS Take by mouth daily.  . meloxicam  (MOBIC) 7.5 MG tablet Take 1 tablet (7.5 mg total) by mouth daily as needed for pain.  . memantine (NAMENDA) 5 MG tablet TAKE ONE TABLET TWICE DAILY  . montelukast (SINGULAIR) 10 MG tablet TAKE 1 TABLET BY MOUTH AT BEDTIME  . potassium chloride (KLOR-CON) 10 MEQ tablet TAKE 1 TABLET BY MOUTH TWICE DAILY  . predniSONE (DELTASONE) 20 MG tablet Take 1 tablet (20 mg total) by mouth daily with breakfast.  . Respiratory Therapy Supplies (FLUTTER) DEVI Use as directed  . traZODone (DESYREL) 50 MG tablet TAKE 1 AND 1/2 TABLET AT BEDTIME AS NEEDED FOR SLEEP  . TUSSIN DM 100-10 MG/5ML liquid TAKE 1 TEASPOONFUL BY MOUTH EVERY 4 HOURS AS NEEDED FOR COUGH (Patient not taking: Reported on 03/18/2020)  . vitamin E 1000 UNIT capsule Take 1,000 Units by mouth daily.   No facility-administered medications prior to visit.    Review of Systems  Constitutional: Negative for appetite change, chills, fatigue and fever.  Respiratory: Negative for chest tightness and shortness of breath.   Cardiovascular: Negative for chest pain and palpitations.  Gastrointestinal: Negative for abdominal pain, nausea and vomiting.  Neurological: Negative for dizziness and weakness.    {Heme  Chem  Endocrine  Serology  Results Review (optional):23779::" "}  Objective    There were no vitals taken for this visit. {Show previous vital signs (optional):23777::" "}  Physical Exam  ***  No results found for any visits on 04/24/20.  Assessment & Plan     ***  No follow-ups on file.      {provider attestation***:1}   Wilhemena Durie, MD  [facility] [phone] (phone) [phone] (fax)  [place]

## 2020-04-22 ENCOUNTER — Encounter: Payer: Self-pay | Admitting: Family Medicine

## 2020-04-24 ENCOUNTER — Ambulatory Visit: Payer: Self-pay | Admitting: Family Medicine

## 2020-04-24 DIAGNOSIS — G3184 Mild cognitive impairment, so stated: Secondary | ICD-10-CM

## 2020-04-24 DIAGNOSIS — M25512 Pain in left shoulder: Secondary | ICD-10-CM

## 2020-04-24 DIAGNOSIS — F0390 Unspecified dementia without behavioral disturbance: Secondary | ICD-10-CM

## 2020-04-29 ENCOUNTER — Other Ambulatory Visit: Payer: Self-pay | Admitting: Family Medicine

## 2020-04-29 DIAGNOSIS — E876 Hypokalemia: Secondary | ICD-10-CM

## 2020-04-29 NOTE — Telephone Encounter (Signed)
Requested medication (s) are due for refill today: Yes  Requested medication (s) are on the active medication list: Yes  Last refill:  04/06/19  Future visit scheduled: No  Notes to clinic:  Unable to refill per protocol, expired Rx     Requested Prescriptions  Pending Prescriptions Disp Refills   ezetimibe (ZETIA) 10 MG tablet [Pharmacy Med Name: EZETIMIBE 10 MG TAB] 30 tablet 12    Sig: TAKE 1 TABLET BY MOUTH DAILY      Cardiovascular:  Antilipid - Sterol Transport Inhibitors Passed - 04/29/2020  1:12 PM      Passed - Total Cholesterol in normal range and within 360 days    Cholesterol, Total  Date Value Ref Range Status  05/26/2016 237 (H) 100 - 199 mg/dL Final   Cholesterol  Date Value Ref Range Status  02/12/2020 128 0 - 200 Final          Passed - LDL in normal range and within 360 days    LDL Cholesterol (Calc)  Date Value Ref Range Status  09/13/2017 104 (H) mg/dL (calc) Final    Comment:    Reference range: <100 . Desirable range <100 mg/dL for primary prevention;   <70 mg/dL for patients with CHD or diabetic patients  with > or = 2 CHD risk factors. Marland Kitchen LDL-C is now calculated using the Martin-Hopkins  calculation, which is a validated novel method providing  better accuracy than the Friedewald equation in the  estimation of LDL-C.  Cresenciano Genre et al. Annamaria Helling. 5361;443(15): 2061-2068  (http://education.[facility].com/faq/FAQ164)    LDL Cholesterol  Date Value Ref Range Status  02/12/2020 49  Final          Passed - HDL in normal range and within 360 days    HDL  Date Value Ref Range Status  02/12/2020 62 35 - 70 Final  05/26/2016 54 >39 mg/dL Final          Passed - Triglycerides in normal range and within 360 days    Triglycerides  Date Value Ref Range Status  02/12/2020 85 40 - 160 Final          Passed - Valid encounter within last 12 months    Recent Outpatient Visits           1 month ago Encounter for screening fecal occult blood  testing   [facility] Jerrol Banana., MD   3 months ago [facility] Jerrol Banana., MD   3 months ago Noninfected skin tear of left lower extremity, initial encounter   [facility], [place], [place]   10 months ago Benign essential HTN   [facility] Jerrol Banana., MD   1 year ago [place], [place]       Future Appointments             In 1 month Jerrol Banana., MD [facility], [place]

## 2020-04-30 ENCOUNTER — Other Ambulatory Visit: Payer: Self-pay | Admitting: Internal Medicine

## 2020-04-30 ENCOUNTER — Other Ambulatory Visit: Payer: Self-pay | Admitting: *Deleted

## 2020-04-30 DIAGNOSIS — E7800 Pure hypercholesterolemia, unspecified: Secondary | ICD-10-CM

## 2020-04-30 DIAGNOSIS — J449 Chronic obstructive pulmonary disease, unspecified: Secondary | ICD-10-CM

## 2020-04-30 MED ORDER — ATORVASTATIN CALCIUM 10 MG PO TABS: 10.0000 mg | ORAL_TABLET | Freq: Every day | ORAL | 11 refills | Status: DC

## 2020-05-08 DIAGNOSIS — I25118 Atherosclerotic heart disease of native coronary artery with other forms of angina pectoris: Secondary | ICD-10-CM | POA: Diagnosis not present

## 2020-05-27 DIAGNOSIS — I700 Atherosclerosis of aorta: Secondary | ICD-10-CM | POA: Diagnosis not present

## 2020-05-27 DIAGNOSIS — I25118 Atherosclerotic heart disease of native coronary artery with other forms of angina pectoris: Secondary | ICD-10-CM | POA: Diagnosis not present

## 2020-05-27 DIAGNOSIS — I6523 Occlusion and stenosis of bilateral carotid arteries: Secondary | ICD-10-CM | POA: Diagnosis not present

## 2020-05-28 DIAGNOSIS — N1832 Chronic kidney disease, stage 3b: Secondary | ICD-10-CM | POA: Diagnosis not present

## 2020-05-28 DIAGNOSIS — Z794 Long term (current) use of insulin: Secondary | ICD-10-CM | POA: Diagnosis not present

## 2020-05-28 DIAGNOSIS — E1129 Type 2 diabetes mellitus with other diabetic kidney complication: Secondary | ICD-10-CM | POA: Diagnosis not present

## 2020-05-28 DIAGNOSIS — E1159 Type 2 diabetes mellitus with other circulatory complications: Secondary | ICD-10-CM | POA: Diagnosis not present

## 2020-05-28 DIAGNOSIS — E1142 Type 2 diabetes mellitus with diabetic polyneuropathy: Secondary | ICD-10-CM | POA: Diagnosis not present

## 2020-05-28 DIAGNOSIS — I10 Essential (primary) hypertension: Secondary | ICD-10-CM | POA: Diagnosis not present

## 2020-05-28 DIAGNOSIS — E1122 Type 2 diabetes mellitus with diabetic chronic kidney disease: Secondary | ICD-10-CM | POA: Diagnosis not present

## 2020-06-04 DIAGNOSIS — Z794 Long term (current) use of insulin: Secondary | ICD-10-CM | POA: Diagnosis not present

## 2020-06-04 DIAGNOSIS — E1165 Type 2 diabetes mellitus with hyperglycemia: Secondary | ICD-10-CM | POA: Diagnosis not present

## 2020-06-10 DIAGNOSIS — I700 Atherosclerosis of aorta: Secondary | ICD-10-CM | POA: Diagnosis not present

## 2020-06-10 DIAGNOSIS — I10 Essential (primary) hypertension: Secondary | ICD-10-CM | POA: Diagnosis not present

## 2020-06-10 DIAGNOSIS — I480 Paroxysmal atrial fibrillation: Secondary | ICD-10-CM | POA: Diagnosis not present

## 2020-06-10 DIAGNOSIS — Z23 Encounter for immunization: Secondary | ICD-10-CM | POA: Diagnosis not present

## 2020-06-10 DIAGNOSIS — I2510 Atherosclerotic heart disease of native coronary artery without angina pectoris: Secondary | ICD-10-CM | POA: Diagnosis not present

## 2020-06-17 ENCOUNTER — Encounter (INDEPENDENT_AMBULATORY_CARE_PROVIDER_SITE_OTHER): Payer: Medicare Other

## 2020-06-17 ENCOUNTER — Ambulatory Visit (INDEPENDENT_AMBULATORY_CARE_PROVIDER_SITE_OTHER): Payer: Medicare Other | Admitting: Vascular Surgery

## 2020-06-18 ENCOUNTER — Telehealth: Payer: Medicare Other | Admitting: Physician Assistant

## 2020-06-18 ENCOUNTER — Ambulatory Visit: Payer: Self-pay | Admitting: Family Medicine

## 2020-06-18 ENCOUNTER — Encounter: Payer: Self-pay | Admitting: Family Medicine

## 2020-06-18 DIAGNOSIS — R300 Dysuria: Secondary | ICD-10-CM | POA: Diagnosis not present

## 2020-06-18 DIAGNOSIS — Z03818 Encounter for observation for suspected exposure to other biological agents ruled out: Secondary | ICD-10-CM | POA: Diagnosis not present

## 2020-06-18 DIAGNOSIS — R05 Cough: Secondary | ICD-10-CM | POA: Diagnosis not present

## 2020-06-18 DIAGNOSIS — R0600 Dyspnea, unspecified: Secondary | ICD-10-CM | POA: Diagnosis not present

## 2020-06-18 DIAGNOSIS — R42 Dizziness and giddiness: Secondary | ICD-10-CM | POA: Diagnosis not present

## 2020-06-18 DIAGNOSIS — U071 COVID-19: Secondary | ICD-10-CM | POA: Diagnosis not present

## 2020-06-18 NOTE — Telephone Encounter (Signed)
Spoke with daughter and per provider, it is recommended that she go to the ER. She is still vomiting after her head injury which is concerning. Also concerned for possible pneumonia.

## 2020-06-18 NOTE — Progress Notes (Signed)
Subjective:   [name] is a 86 y.o. female who presents for Medicare Annual (Subsequent) preventive examination.  I connected with [name] today by telephone and verified that I am speaking with the correct person using two identifiers. Location patient: home Location provider: work Persons participating in the virtual visit: patient, provider.   I discussed the limitations, risks, security and privacy concerns of performing an evaluation and management service by telephone and the availability of in person appointments. I also discussed with the patient that there may be a patient responsible charge related to this service. The patient expressed understanding and verbally consented to this telephonic visit.    Interactive audio and video telecommunications were attempted between this provider and patient, however failed, due to patient having technical difficulties OR patient did not have access to video capability.  We continued and completed visit with audio only.   Review of Systems    N/A  Cardiac Risk Factors include: advanced age (>36men, >75 women);diabetes mellitus;dyslipidemia;hypertension     Objective:    There were no vitals filed for this visit. There is no height or weight on file to calculate BMI.  Advanced Directives 06/19/2020 03/30/2020 01/18/2020 05/10/2019 03/01/2019 08/23/2018 08/15/2018  Does Patient Have a Medical Advance Directive? Yes Yes No No Yes No No  Type of Programmer, systems - - Healthcare Power of [place];Living will - -  Does patient want to make changes to medical advance directive? - - - - - - -  Copy of [place] in Chart? No - copy requested - - - No - copy requested - -  Would patient like information on creating a medical advance directive? - - - Yes (MAU/Ambulatory/Procedural Areas - Information given) - No - Patient declined No - Patient declined     Current Medications (verified) Outpatient Encounter Medications as of 06/19/2020  Medication Sig  . ADVAIR HFA 230-21 MCG/ACT inhaler TAKE 2 PUFFS INTO LUNGS TWICE A DAY  . albuterol (VENTOLIN HFA) 108 (90 Base) MCG/ACT inhaler INHALE 2 PUFFS INTO THE LUNGS EVERY 4 HOURS AS NEEDED FOR WHEEZING ORSHORTNESS OF BREATH  . Ascorbic Acid (VITAMIN C) 1000 MG tablet Take 1,000 mg by mouth daily.   Marland Kitchen aspirin 325 MG tablet Take 325 mg by mouth daily. Reported on 01/05/2016  . atorvastatin (LIPITOR) 10 MG tablet Take 1 tablet (10 mg total) by mouth daily.  . cefdinir (OMNICEF) 300 MG capsule Take 300 mg by mouth 2 (two) times daily. X 10 days  . cetirizine (ZYRTEC) 10 MG tablet TAKE ONE TABLET BY MOUTH EVERY DAY  . cholecalciferol (VITAMIN D3) 25 MCG (1000 UT) tablet Take 1,000 Units by mouth daily.  . clopidogrel (PLAVIX) 75 MG tablet TAKE 1 TABLET BY MOUTH DAILY  . diltiazem (TIAZAC) 360 MG 24 hr capsule TAKE 1 CAPSULE EVERY DAY  . ezetimibe (ZETIA) 10 MG tablet TAKE 1 TABLET BY MOUTH DAILY  . glucose blood test strip ACCU-CHEK AVIVA PLUS TEST STRP Use to check sugars 3 times daily.  . insulin lispro (HUMALOG KWIKPEN) 100 UNIT/ML KiwkPen Inject 10 Units into the skin 3 (three) times daily. 10 units before breakfast, 8 before lunch and 18 before supper  . isosorbide mononitrate (IMDUR) 30 MG 24 hr tablet Take 30 mg by mouth daily.  . lansoprazole (PREVACID) 30 MG capsule TAKE 1 CAPSULE BY MOUTH DAILY AT NOON  . losartan (COZAAR) 100 MG tablet TAKE ONE TABLET EVERY DAY  .  Magnesium 400 MG CAPS Take by mouth daily.  . memantine (NAMENDA) 5 MG tablet TAKE ONE TABLET TWICE DAILY  . montelukast (SINGULAIR) 10 MG tablet TAKE 1 TABLET BY MOUTH AT BEDTIME  . pantoprazole (PROTONIX) 40 MG tablet Take 40 mg by mouth daily.  . potassium chloride (KLOR-CON) 10 MEQ tablet TAKE 1 TABLET BY MOUTH TWICE DAILY  . QUEtiapine (SEROQUEL) 25 MG tablet Take 1 tablet by mouth at bedtime.  Marland Kitchen Respiratory Therapy Supplies  (FLUTTER) DEVI Use as directed  . traZODone (DESYREL) 50 MG tablet TAKE 1 AND 1/2 TABLET AT BEDTIME AS NEEDED FOR SLEEP  . vitamin E 1000 UNIT capsule Take 1,000 Units by mouth daily.  Marland Kitchen FLUoxetine (PROZAC) 20 MG capsule Take 1 capsule (20 mg total) by mouth daily. (Patient not taking: Reported on 06/19/2020)  . Fluticasone-Salmeterol (ADVAIR DISKUS) 250-50 MCG/DOSE AEPB Inhale 1 puff into the lungs 2 (two) times daily. (Patient not taking: Reported on 06/19/2020)  . insulin glargine (LANTUS) 100 UNIT/ML injection Inject 0.05 mLs (5 Units total) into the skin at bedtime. (Patient not taking: Reported on 06/19/2020)  . meloxicam (MOBIC) 7.5 MG tablet Take 1 tablet (7.5 mg total) by mouth daily as needed for pain. (Patient not taking: Reported on 06/19/2020)  . predniSONE (DELTASONE) 20 MG tablet Take 1 tablet (20 mg total) by mouth daily with breakfast. (Patient not taking: Reported on 06/19/2020)  . TUSSIN DM 100-10 MG/5ML liquid TAKE 1 TEASPOONFUL BY MOUTH EVERY 4 HOURS AS NEEDED FOR COUGH (Patient not taking: Reported on 03/18/2020)   No facility-administered encounter medications on file as of 06/19/2020.    Allergies (verified) Bacitracin-neomycin-polymyxin, Neomycin-bacitracin zn-polymyx, Latex, Lidocaine, Aricept [donepezil hcl], Benzalkonium chloride, Ibuprofen, Valdecoxib, Albuterol, Tape, and Triamcinolone   History: Past Medical History:  Diagnosis Date  . Angina pectoris ([place])   . Atrial fibrillation ([place])   . Basal cell carcinoma    nose  . Cervicalgia   . Chronic back pain   . Chronic obstructive pulmonary disease (COPD) ([place])   . COPD (chronic obstructive pulmonary disease) ([place])   . DDD (degenerative disc disease), lumbar   . Diabetes mellitus without complication ([place])   . Dysphagia   . Hyperlipemia   . Hypertension   . Squamous cell carcinoma of skin    left lateral pretibial  . Vulvovaginitis    Past Surgical History:  Procedure Laterality Date  . gall bladder    .  melanoma     removal neck and back  . PERIPHERAL VASCULAR CATHETERIZATION Left 01/05/2016   Procedure: Lower Extremity Angiography;  Surgeon: Algernon Huxley, MD;  Location: [facility];  Service: Cardiovascular;  Laterality: Left;  . PERIPHERAL VASCULAR CATHETERIZATION  01/05/2016   Procedure: Lower Extremity Intervention;  Surgeon: Algernon Huxley, MD;  Location: [facility];  Service: Cardiovascular;;  . ureterolithiasis     calculus removed  . VAGINAL HYSTERECTOMY    . VISCERAL ANGIOGRAPHY N/A 05/10/2019   Procedure: VISCERAL ANGIOGRAPHY;  Surgeon: Algernon Huxley, MD;  Location: [facility];  Service: Cardiovascular;  Laterality: N/A;  . VULVA / PERINEUM BIOPSY  05/29/2015   Family History  Problem Relation Age of Onset  . Ovarian cancer Other   . Diabetes Sister   . Colon cancer Brother   . Diabetes Brother   . Atrial fibrillation Daughter   . Breast cancer Neg Hx    Social History   Socioeconomic History  . Marital status: Widowed    Spouse name:  Not on file  . Number of children: 3  . Years of education: Not on file  . Highest education level: 8th grade  Occupational History  . Occupation: retired  Tobacco Use  . Smoking status: Never Smoker  . Smokeless tobacco: Never Used  Vaping Use  . Vaping Use: Never used  Substance and Sexual Activity  . Alcohol use: No  . Drug use: No  . Sexual activity: Never  Other Topics Concern  . Not on file  Social History Narrative  . Not on file   Social Determinants of Health   Financial Resource Strain: Low Risk   . Difficulty of Paying Living Expenses: Not hard at all  Food Insecurity: No Food Insecurity  . Worried About Charity fundraiser in the Last Year: Never true  . Ran Out of Food in the Last Year: Never true  Transportation Needs: No Transportation Needs  . Lack of Transportation (Medical): No  . Lack of Transportation (Non-Medical): No  Physical Activity: Inactive  . Days of Exercise per Week: 0  days  . Minutes of Exercise per Session: 0 min  Stress: No Stress Concern Present  . Feeling of Stress : Only a little  Social Connections: Moderately Isolated  . Frequency of Communication with Friends and Family: More than three times a week  . Frequency of Social Gatherings with Friends and Family: More than three times a week  . Attends Religious Services: More than 4 times per year  . Active Member of Clubs or Organizations: No  . Attends Archivist Meetings: Never  . Marital Status: Widowed    Tobacco Counseling Counseling given: Not Answered   Clinical Intake:  Pre-visit preparation completed: Yes  Pain : 0-10 Pain Type: Acute pain (fell on 06/17/20) Pain Location: Head (and right arm) Pain Orientation: Posterior Pain Descriptors / Indicators: Aching Pain Frequency: Intermittent Pain Relieving Factors: Currently not taking anything for pain.  Pain Relieving Factors: Currently not taking anything for pain.  Nutritional Risks: Nausea/ vomitting/ diarrhea (Pt has been vomiting since she hit her head on 06/17/20. Last time vomiting was last night. Pt did f/u with urgent care on this issue and pt was given a prescription for an Cefdinir.) Diabetes: Yes  How often do you need to have someone help you when you read instructions, pamphlets, or other written materials from your doctor or pharmacy?: 4 - Often (Due to MD in both eyes.)  Diabetic? Yes  Nutrition Risk Assessment:  Has the patient had any N/V/D within the last 2 months?  No  Does the patient have any non-healing wounds?  No  Has the patient had any unintentional weight loss or weight gain?  No   Diabetes:  Is the patient diabetic?  Yes  If diabetic, was a CBG obtained today?  No  Did the patient bring in their glucometer from home?  No  How often do you monitor your CBG's? Has Dexcom that checks BS continuously.   Financial Strains and Diabetes Management:  Are you having any financial strains  with the device, your supplies or your medication? No .  Does the patient want to be seen by Chronic Care Management for management of their diabetes?  No  Would the patient like to be referred to a Nutritionist or for Diabetic Management?  No   Diabetic Exams:  Diabetic Eye Exam: Overdue for diabetic eye exam. Pt has been advised about the importance in completing this exam. Patient advised to call and  schedule an eye exam.Apt scheduled with Dr Sandra Cockayne for 10/01/20. Diabetic Foot Exam: Completed 05/28/20.   Interpreter Needed?: No  Information entered by :: [facility], LPN   Activities of Daily Living In your present state of health, do you have any difficulty performing the following activities: 06/19/2020 01/01/2020  Hearing? Y N  Comment Wears bilateral hearing aids. -  Vision? Y Y  Comment Has MD in both eyes. Due to worsening macular degeneration  Difficulty concentrating or making decisions? [name]  Comment Currently on Namenda. Caregiver reports difficulty remembering.  Walking or climbing stairs? Y Y  Comment Due to SOB. Difficulty climbing stairs.  Dressing or bathing? N N  Doing errands, shopping? Y Y  Comment Does not drive. Family members assist.  Preparing Food and eating ? N Y  Comment - Family members assist with meal preparation.  Using the Toilet? N N  In the past six months, have you accidently leaked urine? N -  Do you have problems with loss of bowel control? N -  Managing your Medications? [place] manages weekly medications. Due to decreased vision/Reports difficulty reading prescription labels  Managing your Finances? [name]  Comment Son manages finances. Family members assist.  Housekeeping or managing your Housekeeping? Y Y  Comment Does minimal cleaning. Family members assist  Some recent data might be hidden    Patient Care Team: Jerrol Banana., MD as PCP - General (Family Medicine) Gabriel Carina, Betsey Holiday, MD as Physician Assistant  (Endocrinology) Estill Cotta, MD as Consulting Physician (Ophthalmology) Lucky Cowboy Erskine Squibb, MD as Referring Physician (Vascular Surgery) Corey Skains, MD as Consulting Physician (Cardiology) Ralene Bathe, MD as Consulting Physician (Dermatology) Gardiner Barefoot, DPM as Consulting Physician (Podiatry) Neldon Labella, RN as Case Manager  Indicate any recent Medical Services you may have received from other than [facility] providers in the past year (date may be approximate).     Assessment:   This is a routine wellness examination for [place].  Hearing/Vision screen No exam data present  Dietary issues and exercise activities discussed: Current Exercise Habits: The patient does not participate in regular exercise at present  Goals    . Chronic Disease Management      Current Barriers:  . Chronic Disease Management support and education needs related to Hypertension, Diabetes and COPD.   Case Manager Clinical Goal(s):  Marland Kitchen Over the next 90 days, patient will not be hospitalized for complications related to chronic illnesses. . Over the next 90 days, patient will take all medications as prescribed. (Caregiver to assist) . Over the next 90 days, patient will attend all scheduled medical appointments.(Caregiver to assist) . Over the next 90 days, patient will monitor blood glucose levels daily and record readings. (Caregiver to assist) . Over the next 90 days, patient will monitor blood pressure. (Caregiver to assist) . Over the next 90 days, patient will follow recommended home safety measures to prevent falls.  Interventions:   . Discussed plans for ongoing care management follow up. Discussed updates with Ms. Kenyon's daughter [name]. Medications are being prepared in compliance packages by [facility] pharmacy. Reports Ms. Odonnel is attempting to take medications as prescribed. Her vision is impaired and she has missed doses d/t unknowingly dropping some of the pills. She is  pending outreach with the [facility] staff. Will follow-up to determine if additional services are needed. . Discussed home safety measures and long-term plans for in-home assistance. Reports Ms. Felker continues to live alone and  can perform most self-care activities independently but may need in-home assistance at some point. A list of available agencies providing personal care services was forwarded to Tammy to review.   Patient Self Care Activities:  . Performs ADL's independently . Requires assistance with IADLs and medication preparation.   Please see past updates related to this goal by clicking on the "Past Updates" button in the selected goal      . Increase water intake     Starting 10/07/2016,  I will continue to drink 6 glasses of water a day.    . Prevent falls     Recommend to remove any items from the home that may cause slips or trips.      Depression Screen PHQ 2/9 Scores 06/19/2020 01/01/2020 09/06/2019 07/26/2019 03/01/2019 11/07/2018 11/03/2017  PHQ - 2 Score 0 0 0 1 1 0 0  PHQ- 9 Score - - - - - - 11    Fall Risk Fall Risk  06/19/2020 01/24/2020 01/01/2020 09/06/2019 07/26/2019  Falls in the past year? 1 1 1 1 1   Comment - - - Reports last fall in 07/2019 -  Number falls in past yr: 1 1 1 1 1   Injury with Fall? 1 1 - 0 0  Risk for fall due to : Impaired balance/gait;Impaired vision;Medication side effect History of fall(s);Impaired balance/gait;Medication side effect;Other (Comment) Impaired balance/gait;Medication side effect;Impaired vision;History of fall(s) - History of fall(s);Impaired balance/gait  Risk for fall due to: Comment - Vision declining (per daughter) Caregiver reports vision is declining. - -  Follow up Falls prevention discussed Falls prevention discussed Falls prevention discussed - Falls prevention discussed  Comment - - - - Discussed with daughter Cherre Huger    Any stairs in or around the home? Yes  If so, are there any without handrails? No  Home  free of loose throw rugs in walkways, pet beds, electrical cords, etc? Yes  Adequate lighting in your home to reduce risk of falls? Yes   ASSISTIVE DEVICES UTILIZED TO PREVENT FALLS:  Life alert? Yes  Use of a cane, walker or w/c? Yes  Grab bars in the bathroom? Yes  Shower chair or bench in shower? Yes  Elevated toilet seat or a handicapped toilet? No    Cognitive Function: MMSE - Mini Mental State Exam 06/07/2017  Orientation to time 5  Orientation to Place 5  Registration 3  Attention/ Calculation 5  Recall 2  Language- name 2 objects 2  Language- repeat 1  Language- follow 3 step command 2  Language- read & follow direction 0  Write a sentence 1  Copy design 1  Total score 27     6CIT Screen 03/01/2019 10/07/2016  What Year? 0 points -  What month? 0 points 0 points  What time? 0 points 0 points  Count back from 20 0 points 0 points  Months in reverse 4 points 0 points  Repeat phrase 0 points 4 points  Total Score 4 -    Immunizations Immunization History  Administered Date(s) Administered  . Fluad Quad(high Dose 65+) 06/20/2019  . Influenza Split 06/27/2010, 07/07/2011, 07/11/2012  . Influenza, High Dose Seasonal PF 07/13/2014, 06/24/2015, 06/16/2016, 07/16/2017, 06/27/2018  . Influenza,inj,Quad PF,6+ Mos 06/06/2013  . PFIZER SARS-COV-2 Vaccination 11/01/2019, 01/18/2020  . Pneumococcal Conjugate-13 11/28/2014  . Pneumococcal Polysaccharide-23 08/01/1995, 11/17/2012  . Td 05/20/2005, 06/27/2018    TDAP status: Up to date Flu Vaccine status: Declined, Education has been provided regarding the importance of this vaccine but patient still  declined. Advised may receive this vaccine at local pharmacy or Health Dept. Aware to provide a copy of the vaccination record if obtained from local pharmacy or Health Dept. Verbalized acceptance and understanding. Pneumococcal vaccine status: Up to date Covid-19 vaccine status: Completed vaccines  Qualifies for Shingles  Vaccine? Yes   Zostavax completed No   Shingrix Completed?: No.    Education has been provided regarding the importance of this vaccine. Patient has been advised to call insurance company to determine out of pocket expense if they have not yet received this vaccine. Advised may also receive vaccine at local pharmacy or Health Dept. Verbalized acceptance and understanding.  Screening Tests Health Maintenance  Topic Date Due  . OPHTHALMOLOGY EXAM  08/01/2019  . INFLUENZA VACCINE  04/27/2020  . HEMOGLOBIN A1C  11/25/2020  . FOOT EXAM  05/28/2021  . TETANUS/TDAP  06/27/2028  . DEXA SCAN  Completed  . COVID-19 Vaccine  Completed  . PNA vac Low Risk Adult  Completed    Health Maintenance  Health Maintenance Due  Topic Date Due  . OPHTHALMOLOGY EXAM  08/01/2019  . INFLUENZA VACCINE  04/27/2020    Colorectal cancer screening: No longer required.  Mammogram status: No longer required.  Bone Density status: Completed 01/10/15. Results reflect: Previous DEXA scan was normal. No repeat needed unless advised by a physician.  Lung Cancer Screening: (Low Dose CT Chest recommended if Age 71-80 years, 30 pack-year currently smoking OR have quit w/in 15years.) does not qualify.   Additional Screening:  Vision Screening: Recommended annual ophthalmology exams for early detection of glaucoma and other disorders of the eye. Is the patient up to date with their annual eye exam?  Yes  Who is the provider or what is the name of the office in which the patient attends annual eye exams? Dr Dingeldein @ [facility] If pt is not established with a provider, would they like to be referred to a provider to establish care? No .   Dental Screening: Recommended annual dental exams for proper oral hygiene  Community Resource Referral / Chronic Care Management: CRR required this visit?  No   CCM required this visit?  No      Plan:     I have personally reviewed and noted the following in the patient's chart:     . Medical and social history . Use of alcohol, tobacco or illicit drugs  . Current medications and supplements . Functional ability and status . Nutritional status . Physical activity . Advanced directives . List of other physicians . Hospitalizations, surgeries, and ER visits in previous 12 months . Vitals . Screenings to include cognitive, depression, and falls . Referrals and appointments  In addition, I have reviewed and discussed with patient certain preventive protocols, quality metrics, and best practice recommendations. A written personalized care plan for preventive services as well as general preventive health recommendations were provided to patient.     [employer] [place], [place]   [id number]   Nurse Notes: Pt to receive her flu shot at next in office apt. Pt has an eye exam scheduled for 10/01/20 with Dr Sandra Cockayne.

## 2020-06-18 NOTE — Progress Notes (Deleted)
MyChart Video Visit    Virtual Visit via Video Note   This visit type was conducted due to national recommendations for restrictions regarding the COVID-19 Pandemic (e.g. social distancing) in an effort to limit this patient's exposure and mitigate transmission in our community. This patient is at least at moderate risk for complications without adequate follow up. This format is felt to be most appropriate for this patient at this time. Physical exam was limited by quality of the video and audio technology used for the visit.   Patient location: *** Provider location: ***  I discussed the limitations of evaluation and management by telemedicine and the availability of in person appointments. The patient expressed understanding and agreed to proceed.  Patient: [name]   DOB: [birth year]   86 y.o. Female  MRN: [id number] Visit Date: 06/18/2020  Today's healthcare provider: Trinna Post, PA-C   No chief complaint on file.  Subjective    URI     ***  {Show patient history (optional):23778::" "}  Medications: Outpatient Medications Prior to Visit  Medication Sig  . ADVAIR HFA 230-21 MCG/ACT inhaler TAKE 2 PUFFS INTO LUNGS TWICE A DAY  . albuterol (VENTOLIN HFA) 108 (90 Base) MCG/ACT inhaler INHALE 2 PUFFS INTO THE LUNGS EVERY 4 HOURS AS NEEDED FOR WHEEZING ORSHORTNESS OF BREATH  . Ascorbic Acid (VITAMIN C) 1000 MG tablet Take 1,000 mg by mouth daily.   Marland Kitchen aspirin 325 MG tablet Take 325 mg by mouth daily. Reported on 01/05/2016  . atorvastatin (LIPITOR) 10 MG tablet Take 1 tablet (10 mg total) by mouth daily.  . cetirizine (ZYRTEC) 10 MG tablet TAKE ONE TABLET BY MOUTH EVERY DAY  . cholecalciferol (VITAMIN D3) 25 MCG (1000 UT) tablet Take 1,000 Units by mouth daily.  . clopidogrel (PLAVIX) 75 MG tablet TAKE 1 TABLET BY MOUTH DAILY  . diltiazem (TIAZAC) 360 MG 24 hr capsule TAKE 1 CAPSULE EVERY DAY  . ezetimibe (ZETIA) 10 MG tablet TAKE 1 TABLET BY MOUTH DAILY  .  FLUoxetine (PROZAC) 20 MG capsule Take 1 capsule (20 mg total) by mouth daily.  . Fluticasone-Salmeterol (ADVAIR DISKUS) 250-50 MCG/DOSE AEPB Inhale 1 puff into the lungs 2 (two) times daily.  Marland Kitchen glucose blood test strip ACCU-CHEK AVIVA PLUS TEST STRP Use to check sugars 3 times daily.  . insulin glargine (LANTUS) 100 UNIT/ML injection Inject 0.05 mLs (5 Units total) into the skin at bedtime. (Patient taking differently: Inject 10 Units into the skin at bedtime. )  . insulin lispro (HUMALOG KWIKPEN) 100 UNIT/ML KiwkPen Inject 10 Units into the skin 4 (four) times daily.   . lansoprazole (PREVACID) 30 MG capsule TAKE 1 CAPSULE BY MOUTH DAILY AT NOON  . losartan (COZAAR) 100 MG tablet TAKE ONE TABLET EVERY DAY  . Magnesium 400 MG CAPS Take by mouth daily.  . meloxicam (MOBIC) 7.5 MG tablet Take 1 tablet (7.5 mg total) by mouth daily as needed for pain.  . memantine (NAMENDA) 5 MG tablet TAKE ONE TABLET TWICE DAILY  . montelukast (SINGULAIR) 10 MG tablet TAKE 1 TABLET BY MOUTH AT BEDTIME  . potassium chloride (KLOR-CON) 10 MEQ tablet TAKE 1 TABLET BY MOUTH TWICE DAILY  . predniSONE (DELTASONE) 20 MG tablet Take 1 tablet (20 mg total) by mouth daily with breakfast.  . Respiratory Therapy Supplies (FLUTTER) DEVI Use as directed  . traZODone (DESYREL) 50 MG tablet TAKE 1 AND 1/2 TABLET AT BEDTIME AS NEEDED FOR SLEEP  . TUSSIN DM 100-10 MG/5ML liquid  TAKE 1 TEASPOONFUL BY MOUTH EVERY 4 HOURS AS NEEDED FOR COUGH (Patient not taking: Reported on 03/18/2020)  . vitamin E 1000 UNIT capsule Take 1,000 Units by mouth daily.   No facility-administered medications prior to visit.    Review of Systems  {Heme  Chem  Endocrine  Serology  Results Review (optional):23779::" "}  Objective    There were no vitals taken for this visit. {Show previous vital signs (optional):23777::" "}  Physical Exam     Assessment & Plan     ***  No follow-ups on file.     I discussed the assessment and treatment  plan with the patient. The patient was provided an opportunity to ask questions and all were answered. The patient agreed with the plan and demonstrated an understanding of the instructions.   The patient was advised to call back or seek an in-person evaluation if the symptoms worsen or if the condition fails to improve as anticipated.  I provided *** minutes of non-face-to-face time during this encounter.  {provider attestation***:1}  Paulene Floor [facility] [phone] (phone) [phone] (fax)  [place]

## 2020-06-19 ENCOUNTER — Ambulatory Visit (INDEPENDENT_AMBULATORY_CARE_PROVIDER_SITE_OTHER): Payer: Medicare Other

## 2020-06-19 ENCOUNTER — Other Ambulatory Visit: Payer: Self-pay

## 2020-06-19 DIAGNOSIS — Z0000 Encounter for general adult medical examination without abnormal findings: Secondary | ICD-10-CM

## 2020-06-19 NOTE — Patient Instructions (Signed)
[name] , Thank you for taking time to come for your Medicare Wellness Visit. I appreciate your ongoing commitment to your health goals. Please review the following plan we discussed and let me know if I can assist you in the future.   Screening recommendations/referrals: Colonoscopy: No longer required.  Mammogram: No longer required.  Bone Density: Previous DEXA scan was normal. No repeat needed unless advised by a physician. Recommended yearly ophthalmology/optometry visit for glaucoma screening and checkup Recommended yearly dental visit for hygiene and checkup  Vaccinations: Influenza vaccine: Currently due. Pneumococcal vaccine: Completed series Tdap vaccine: Up to date, due 06/2028 Shingles vaccine: Shingrix discussed. Please contact your pharmacy for coverage information.     Advanced directives: Please bring a copy of your POA (Power of Attorney) and/or Living Will to your next appointment.   Conditions/risks identified: Fall risk preventatives discussed today.   Next appointment: 06/23/20 @ 3:20 PM with Dr Rosanna Randy    Preventive Care 65 Years and Older, Female Preventive care refers to lifestyle choices and visits with your health care provider that can promote health and wellness. What does preventive care include?  A yearly physical exam. This is also called an annual well check.  Dental exams once or twice a year.  Routine eye exams. Ask your health care provider how often you should have your eyes checked.  Personal lifestyle choices, including:  Daily care of your teeth and gums.  Regular physical activity.  Eating a healthy diet.  Avoiding tobacco and drug use.  Limiting alcohol use.  Practicing safe sex.  Taking low-dose aspirin every day.  Taking vitamin and mineral supplements as recommended by your health care provider. What happens during an annual well check? The services and screenings done by your health care provider during your annual  well check will depend on your age, overall health, lifestyle risk factors, and family history of disease. Counseling  Your health care provider may ask you questions about your:  Alcohol use.  Tobacco use.  Drug use.  Emotional well-being.  Home and relationship well-being.  Sexual activity.  Eating habits.  History of falls.  Memory and ability to understand (cognition).  Work and work Statistician.  Reproductive health. Screening  You may have the following tests or measurements:  Height, weight, and BMI.  Blood pressure.  Lipid and cholesterol levels. These may be checked every 5 years, or more frequently if you are over 44 years old.  Skin check.  Lung cancer screening. You may have this screening every year starting at age 18 if you have a 30-pack-year history of smoking and currently smoke or have quit within the past 15 years.  Fecal occult blood test (FOBT) of the stool. You may have this test every year starting at age 4.  Flexible sigmoidoscopy or colonoscopy. You may have a sigmoidoscopy every 5 years or a colonoscopy every 10 years starting at age 53.  Hepatitis C blood test.  Hepatitis B blood test.  Sexually transmitted disease (STD) testing.  Diabetes screening. This is done by checking your blood sugar (glucose) after you have not eaten for a while (fasting). You may have this done every 1-3 years.  Bone density scan. This is done to screen for osteoporosis. You may have this done starting at age 55.  Mammogram. This may be done every 1-2 years. Talk to your health care provider about how often you should have regular mammograms. Talk with your health care provider about your test results, treatment options, and  if necessary, the need for more tests. Vaccines  Your health care provider may recommend certain vaccines, such as:  Influenza vaccine. This is recommended every year.  Tetanus, diphtheria, and acellular pertussis (Tdap, Td) vaccine.  You may need a Td booster every 10 years.  Zoster vaccine. You may need this after age 25.  Pneumococcal 13-valent conjugate (PCV13) vaccine. One dose is recommended after age 20.  Pneumococcal polysaccharide (PPSV23) vaccine. One dose is recommended after age 48. Talk to your health care provider about which screenings and vaccines you need and how often you need them. This information is not intended to replace advice given to you by your health care provider. Make sure you discuss any questions you have with your health care provider. Document Released: 10/10/2015 Document Revised: 06/02/2016 Document Reviewed: 07/15/2015 Elsevier Interactive Patient Education  2017 [place] Prevention in the Home Falls can cause injuries. They can happen to people of all ages. There are many things you can do to make your home safe and to help prevent falls. What can I do on the outside of my home?  Regularly fix the edges of walkways and driveways and fix any cracks.  Remove anything that might make you trip as you walk through a door, such as a raised step or threshold.  Trim any bushes or trees on the path to your home.  Use bright outdoor lighting.  Clear any walking paths of anything that might make someone trip, such as rocks or tools.  Regularly check to see if handrails are loose or broken. Make sure that both sides of any steps have handrails.  Any raised decks and porches should have guardrails on the edges.  Have any leaves, snow, or ice cleared regularly.  Use sand or salt on walking paths during winter.  Clean up any spills in your garage right away. This includes oil or grease spills. What can I do in the bathroom?  Use night lights.  Install grab bars by the toilet and in the tub and shower. Do not use towel bars as grab bars.  Use non-skid mats or decals in the tub or shower.  If you need to sit down in the shower, use a plastic, non-slip stool.  Keep the  floor dry. Clean up any water that spills on the floor as soon as it happens.  Remove soap buildup in the tub or shower regularly.  Attach bath mats securely with double-sided non-slip rug tape.  Do not have throw rugs and other things on the floor that can make you trip. What can I do in the bedroom?  Use night lights.  Make sure that you have a light by your bed that is easy to reach.  Do not use any sheets or blankets that are too big for your bed. They should not hang down onto the floor.  Have a firm chair that has side arms. You can use this for support while you get dressed.  Do not have throw rugs and other things on the floor that can make you trip. What can I do in the kitchen?  Clean up any spills right away.  Avoid walking on wet floors.  Keep items that you use a lot in easy-to-reach places.  If you need to reach something above you, use a strong step stool that has a grab bar.  Keep electrical cords out of the way.  Do not use floor polish or wax that makes floors slippery. If you  must use wax, use non-skid floor wax.  Do not have throw rugs and other things on the floor that can make you trip. What can I do with my stairs?  Do not leave any items on the stairs.  Make sure that there are handrails on both sides of the stairs and use them. Fix handrails that are broken or loose. Make sure that handrails are as long as the stairways.  Check any carpeting to make sure that it is firmly attached to the stairs. Fix any carpet that is loose or worn.  Avoid having throw rugs at the top or bottom of the stairs. If you do have throw rugs, attach them to the floor with carpet tape.  Make sure that you have a light switch at the top of the stairs and the bottom of the stairs. If you do not have them, ask someone to add them for you. What else can I do to help prevent falls?  Wear shoes that:  Do not have high heels.  Have rubber bottoms.  Are comfortable and fit  you well.  Are closed at the toe. Do not wear sandals.  If you use a stepladder:  Make sure that it is fully opened. Do not climb a closed stepladder.  Make sure that both sides of the stepladder are locked into place.  Ask someone to hold it for you, if possible.  Clearly mark and make sure that you can see:  Any grab bars or handrails.  First and last steps.  Where the edge of each step is.  Use tools that help you move around (mobility aids) if they are needed. These include:  Canes.  Walkers.  Scooters.  Crutches.  Turn on the lights when you go into a dark area. Replace any light bulbs as soon as they burn out.  Set up your furniture so you have a clear path. Avoid moving your furniture around.  If any of your floors are uneven, fix them.  If there are any pets around you, be aware of where they are.  Review your medicines with your doctor. Some medicines can make you feel dizzy. This can increase your chance of falling. Ask your doctor what other things that you can do to help prevent falls. This information is not intended to replace advice given to you by your health care provider. Make sure you discuss any questions you have with your health care provider. Document Released: 07/10/2009 Document Revised: 02/19/2016 Document Reviewed: 10/18/2014 Elsevier Interactive Patient Education  2017 [employer].

## 2020-06-20 ENCOUNTER — Encounter: Payer: Self-pay | Admitting: Family Medicine

## 2020-06-20 ENCOUNTER — Ambulatory Visit (INDEPENDENT_AMBULATORY_CARE_PROVIDER_SITE_OTHER): Payer: Medicare Other

## 2020-06-20 DIAGNOSIS — E1159 Type 2 diabetes mellitus with other circulatory complications: Secondary | ICD-10-CM

## 2020-06-20 NOTE — Chronic Care Management (AMB) (Signed)
Chronic Care Management   Follow Up Note   06/20/2020 Name: [name] MRN: [id number] DOB: 1929-05-06  Primary Care Provider: Jerrol Banana., MD Reason for referral : Chronic Care Management   [name] is a 86 y.o. year old female who is a primary care patient of Jerrol Banana., MD. She is currently engaged with the chronic care management team. A telephonic outreach was conducted today with [name].  Review of [name]'s status, including review of consultants reports, relevant labs and test results was conducted today. Collaboration with appropriate care team members was performed as part of the comprehensive evaluation and provision of chronic care management services.    SDOH (Social Determinants of Health) assessments performed: No   Outpatient Encounter Medications as of 06/20/2020  Medication Sig  . ADVAIR HFA 230-21 MCG/ACT inhaler TAKE 2 PUFFS INTO LUNGS TWICE A DAY  . albuterol (VENTOLIN HFA) 108 (90 Base) MCG/ACT inhaler INHALE 2 PUFFS INTO THE LUNGS EVERY 4 HOURS AS NEEDED FOR WHEEZING ORSHORTNESS OF BREATH  . Ascorbic Acid (VITAMIN C) 1000 MG tablet Take 1,000 mg by mouth daily.   Marland Kitchen aspirin 325 MG tablet Take 325 mg by mouth daily. Reported on 01/05/2016  . atorvastatin (LIPITOR) 10 MG tablet Take 1 tablet (10 mg total) by mouth daily.  . cefdinir (OMNICEF) 300 MG capsule Take 300 mg by mouth 2 (two) times daily. X 10 days  . cetirizine (ZYRTEC) 10 MG tablet TAKE ONE TABLET BY MOUTH EVERY DAY  . cholecalciferol (VITAMIN D3) 25 MCG (1000 UT) tablet Take 1,000 Units by mouth daily.  . clopidogrel (PLAVIX) 75 MG tablet TAKE 1 TABLET BY MOUTH DAILY  . diltiazem (TIAZAC) 360 MG 24 hr capsule TAKE 1 CAPSULE EVERY DAY  . ezetimibe (ZETIA) 10 MG tablet TAKE 1 TABLET BY MOUTH DAILY  . FLUoxetine (PROZAC) 20 MG capsule Take 1 capsule (20 mg total) by mouth daily. (Patient not taking: Reported on 06/19/2020)  .  Fluticasone-Salmeterol (ADVAIR DISKUS) 250-50 MCG/DOSE AEPB Inhale 1 puff into the lungs 2 (two) times daily. (Patient not taking: Reported on 06/19/2020)  . glucose blood test strip ACCU-CHEK AVIVA PLUS TEST STRP Use to check sugars 3 times daily.  . insulin glargine (LANTUS) 100 UNIT/ML injection Inject 0.05 mLs (5 Units total) into the skin at bedtime. (Patient not taking: Reported on 06/19/2020)  . insulin lispro (HUMALOG KWIKPEN) 100 UNIT/ML KiwkPen Inject 10 Units into the skin 3 (three) times daily. 10 units before breakfast, 8 before lunch and 18 before supper  . isosorbide mononitrate (IMDUR) 30 MG 24 hr tablet Take 30 mg by mouth daily.  . lansoprazole (PREVACID) 30 MG capsule TAKE 1 CAPSULE BY MOUTH DAILY AT NOON  . losartan (COZAAR) 100 MG tablet TAKE ONE TABLET EVERY DAY  . Magnesium 400 MG CAPS Take by mouth daily.  . meloxicam (MOBIC) 7.5 MG tablet Take 1 tablet (7.5 mg total) by mouth daily as needed for pain. (Patient not taking: Reported on 06/19/2020)  . memantine (NAMENDA) 5 MG tablet TAKE ONE TABLET TWICE DAILY  . montelukast (SINGULAIR) 10 MG tablet TAKE 1 TABLET BY MOUTH AT BEDTIME  . pantoprazole (PROTONIX) 40 MG tablet Take 40 mg by mouth daily.  . potassium chloride (KLOR-CON) 10 MEQ tablet TAKE 1 TABLET BY MOUTH TWICE DAILY  . predniSONE (DELTASONE) 20 MG tablet Take 1 tablet (20 mg total) by mouth daily with breakfast. (Patient not taking: Reported on 06/19/2020)  . QUEtiapine (SEROQUEL) 25  MG tablet Take 1 tablet by mouth at bedtime.  Marland Kitchen Respiratory Therapy Supplies (FLUTTER) DEVI Use as directed  . traZODone (DESYREL) 50 MG tablet TAKE 1 AND 1/2 TABLET AT BEDTIME AS NEEDED FOR SLEEP  . TUSSIN DM 100-10 MG/5ML liquid TAKE 1 TEASPOONFUL BY MOUTH EVERY 4 HOURS AS NEEDED FOR COUGH (Patient not taking: Reported on 03/18/2020)  . vitamin E 1000 UNIT capsule Take 1,000 Units by mouth daily.   No facility-administered encounter medications on file as of 06/20/2020.      Goals  Addressed            This Visit's Progress   . Chronic Disease Management        Current Barriers:  . Chronic Disease Management support and education needs related to Hypertension, Diabetes and COPD.   Case Manager Clinical Goal(s):  Marland Kitchen Over the next 90 days, patient will not be hospitalized for complications related to chronic illnesses. . Over the next 90 days, patient will take all medications as prescribed. (Caregiver to assist) . Over the next 90 days, patient will attend all scheduled medical appointments.(Caregiver to assist) . Over the next 90 days, patient will monitor blood glucose levels daily and record readings. (Caregiver to assist) . Over the next 90 days, patient will monitor blood pressure. (Caregiver to assist) . Over the next 90 days, patient will follow recommended home safety measures to prevent falls.  Interventions:   . Discussed plans for ongoing care management follow up. Discussed updates with Ms. Vink's daughter [name]. Tammy reports that [name] was recently diagnosed with Covid-19. Reports she is taking maintenance medications and antibiotics as prescribed. She has a persistent cough but has not complained of shortness of breath. Tammy reports that she has a very poor appetite but drinking lots of fluids. We discussed precautions r/t hypoglycemia. Tammy recalls [name] taking appropriate measures and attempting to eat  a snack when her Dexcom alarms. The family calls to check on her frequently throughout the day. She remains very weak and is at increased risk for falls. We discussed worsening s/sx that require immediate medical attention. Tammy agreed to seek emergency medical support if [name]'s condition worsens over the weekend. We will plan to touch base again next week.  Patient Self Care Activities:  . Performs ADL's independently . Requires assistance with IADLs and medication preparation.   Please see past updates related to this  goal by clicking on the "Past Updates" button in the selected goal         PLAN A member of the care management team will follow-up next week.   Cristy [facility] Care Management [facility] [phone]

## 2020-06-22 ENCOUNTER — Telehealth (HOSPITAL_COMMUNITY): Payer: Self-pay | Admitting: Nurse Practitioner

## 2020-06-22 NOTE — Telephone Encounter (Signed)
Called to discuss with [name] about Covid symptoms and the use of casirivimab/imdevimab, a combination monoclonal antibody infusion for those with mild to moderate Covid symptoms and at a high risk of hospitalization.     Pt is qualified for this infusion at the [facility] infusion center due to co-morbid conditions (as indicated below).   Unable to reach. Left Voicemail on daughter's number. My Chart message sent    Patient Active Problem List   Diagnosis Date Noted  . Celiac artery stenosis ([place]) 06/12/2019  . Bilateral lower abdominal cramping 04/20/2019  . UTI symptoms 04/20/2019  . History of rectal bleeding 03/26/2019  . DM type 2 with diabetic peripheral neuropathy ([place]) 12/14/2018  . Arthropathy of lumbar facet joint 09/04/2018  . Degeneration of lumbar intervertebral disc 09/04/2018  . Spondylolisthesis, grade 1 09/04/2018  . Lewy body dementia ([place]) 02/17/2018  . PVD (peripheral vascular disease) ([place]) 08/16/2017  . Pain in limb 07/27/2016  . Hallucinations 07/07/2016  . Chest pain 07/01/2016  . Depression 06/23/2016  . Hypokalemia 09/03/2015  . Insomnia 07/23/2015  . Headache 07/23/2015  . Type 2 diabetes mellitus with vascular disease ([place]) 06/24/2015  . Chronic vulvitis 06/02/2015  . Allergic rhinitis 05/30/2015  . Back ache 05/30/2015  . Body mass index (BMI) of 29.0-29.9 in adult 05/30/2015  . Edema 05/30/2015  . Dilatation of esophagus 05/30/2015  . Fall 05/30/2015  . H/O deep venous thrombosis 05/30/2015  . Hypercholesteremia 05/30/2015  . Heart & renal disease, hypertensive, with heart failure ([place]) 05/30/2015  . Below normal amount of sodium in the blood 05/30/2015  . Calculus of kidney 05/30/2015  . Gonalgia 05/30/2015  . Psoriasis 05/30/2015  . Avitaminosis D 05/30/2015  . Cough 04/01/2015  . Combined fat and carbohydrate induced hyperlipemia 12/30/2014  . Paroxysmal atrial fibrillation ([place]) 07/10/2014  . Abnormal gait 07/08/2014  .  Anxiety state 07/08/2014  . Chronic kidney disease 07/08/2014  . Acid reflux 07/08/2014  . 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[place]) 07/08/2014  . Chronic obstructive pulmonary disease ([place]) 07/08/2014  . Type II diabetes mellitus with renal manifestations ([place]) 07/08/2014  . Benign essential HTN 06/26/2014  . Carotid artery narrowing 06/26/2014  . Myalgia 06/26/2014  . Basal cell carcinoma of face 05/01/2014  . Disease of female genital organs 11/28/2012  . Incomplete bladder emptying 09/05/2012  . Excessive urination at night 08/15/2012  . Basal cell carcinoma of ear 10/26/2011    Alda Lea, AGPCNP-BC

## 2020-06-23 ENCOUNTER — Ambulatory Visit: Payer: Self-pay | Admitting: Family Medicine

## 2020-06-23 DIAGNOSIS — U071 COVID-19: Secondary | ICD-10-CM | POA: Diagnosis not present

## 2020-06-23 DIAGNOSIS — E119 Type 2 diabetes mellitus without complications: Secondary | ICD-10-CM | POA: Diagnosis not present

## 2020-06-23 DIAGNOSIS — Z794 Long term (current) use of insulin: Secondary | ICD-10-CM | POA: Diagnosis not present

## 2020-06-23 NOTE — Telephone Encounter (Signed)
I sent in her name to [facility] for infusion. consideration

## 2020-06-24 ENCOUNTER — Other Ambulatory Visit: Payer: Self-pay | Admitting: Family Medicine

## 2020-06-24 DIAGNOSIS — J3089 Other allergic rhinitis: Secondary | ICD-10-CM

## 2020-06-25 ENCOUNTER — Other Ambulatory Visit (INDEPENDENT_AMBULATORY_CARE_PROVIDER_SITE_OTHER): Payer: Self-pay | Admitting: Nurse Practitioner

## 2020-06-25 DIAGNOSIS — U071 COVID-19: Secondary | ICD-10-CM | POA: Diagnosis not present

## 2020-06-25 DIAGNOSIS — I771 Stricture of artery: Secondary | ICD-10-CM

## 2020-06-25 DIAGNOSIS — J1282 Pneumonia due to coronavirus disease 2019: Secondary | ICD-10-CM | POA: Diagnosis not present

## 2020-06-25 NOTE — Patient Instructions (Addendum)
Thank you for allowing the Chronic Care Management team to participate in your care.   Goals Addressed            This Visit's Progress   . Chronic Disease Management        Current Barriers:  . Chronic Disease Management support and education needs related to Hypertension, Diabetes and COPD.   Case Manager Clinical Goal(s):  Marland Kitchen Over the next 90 days, patient will not be hospitalized for complications related to chronic illnesses. . Over the next 90 days, patient will take all medications as prescribed. (Caregiver to assist) . Over the next 90 days, patient will attend all scheduled medical appointments.(Caregiver to assist) . Over the next 90 days, patient will monitor blood glucose levels daily and record readings. (Caregiver to assist) . Over the next 90 days, patient will monitor blood pressure. (Caregiver to assist) . Over the next 90 days, patient will follow recommended home safety measures to prevent falls.  Interventions:   . Discussed plans for ongoing care management follow up. Discussed updates with Ms. Both's daughter [name]. Tammy reports that Ms. Forlenza was recently diagnosed with Covid-19. Reports she is taking maintenance medications and antibiotics as prescribed. She has a persistent cough but has not complained of shortness of breath. Tammy reports that she has a very poor appetite but drinking lots of fluids. We discussed precautions r/t hypoglycemia. Tammy recalls Ms. Bos taking appropriate measures and attempting to eat  a snack when her Dexcom alarms. The family calls to check on her frequently throughout the day. She remains very weak and is at increased risk for falls. We discussed worsening s/sx that require immediate medical attention. Tammy agreed to seek emergency medical support if Ms. Mazurowski's condition worsens over the weekend. We will plan to touch base again next week.  Patient Self Care Activities:  . Performs ADL's  independently . Requires assistance with IADLs and medication preparation.   Please see past updates related to this goal by clicking on the "Past Updates" button in the selected goal        Ms. Smeal's caregiver/daughter Tammy verbalized understanding of the information discussed during the telephonic outreach today. Declined need for a mailed/printed copy.   A member of the care management team will follow-up next week.    [facility]rist[facility] Care Management [facility] [phone]

## 2020-07-02 ENCOUNTER — Encounter (INDEPENDENT_AMBULATORY_CARE_PROVIDER_SITE_OTHER): Payer: Medicare Other

## 2020-07-02 ENCOUNTER — Ambulatory Visit (INDEPENDENT_AMBULATORY_CARE_PROVIDER_SITE_OTHER): Payer: Medicare Other | Admitting: Nurse Practitioner

## 2020-07-04 ENCOUNTER — Ambulatory Visit: Payer: Self-pay

## 2020-07-04 DIAGNOSIS — E1159 Type 2 diabetes mellitus with other circulatory complications: Secondary | ICD-10-CM

## 2020-07-04 NOTE — Chronic Care Management (AMB) (Signed)
Chronic Care Management   Follow Up Note   07/04/2020 Name: [name] Hall MRN: [id number] DOB: [birth year]  Primary Care Provider: Jerrol Banana., MD Reason for referral : Chronic Care  Management   [name] Hall is a 86 y.o. year old female who is a primary care patient of Jerrol Banana., MD. She is engaged with the chronic care management team. A brief outreach was conducted today with [name].  Review of [name]'s status, including review of consultants reports, relevant labs and test results was conducted today. Collaboration with appropriate care team members was performed as part of the comprehensive evaluation and provision of chronic care management services.    SDOH (Social Determinants of Health) assessments performed: No     Outpatient Encounter Medications as of 07/04/2020  Medication Sig  . ADVAIR HFA 230-21 MCG/ACT inhaler TAKE 2 PUFFS INTO LUNGS TWICE A DAY  . albuterol (VENTOLIN HFA) 108 (90 Base) MCG/ACT inhaler INHALE 2 PUFFS INTO THE LUNGS EVERY 4 HOURS AS NEEDED FOR WHEEZING ORSHORTNESS OF BREATH  . Ascorbic Acid (VITAMIN C) 1000 MG tablet Take 1,000 mg by mouth daily.   Marland Kitchen aspirin 325 MG tablet Take 325 mg by mouth daily. Reported on 01/05/2016  . atorvastatin (LIPITOR) 10 MG tablet Take 1 tablet (10 mg total) by mouth daily.  . cetirizine (ZYRTEC) 10 MG tablet TAKE ONE TABLET BY MOUTH EVERY DAY  . cholecalciferol (VITAMIN D3) 25 MCG (1000 UT) tablet Take 1,000 Units by mouth daily.  . clopidogrel (PLAVIX) 75 MG tablet TAKE 1 TABLET BY MOUTH DAILY  . diltiazem (TIAZAC) 360 MG 24 hr capsule TAKE 1 CAPSULE EVERY DAY  . ezetimibe (ZETIA) 10 MG tablet TAKE 1 TABLET BY MOUTH DAILY  . FLUoxetine (PROZAC) 20 MG capsule Take 1 capsule (20 mg total) by mouth daily. (Patient not taking: Reported on 06/19/2020)  . Fluticasone-Salmeterol (ADVAIR DISKUS) 250-50 MCG/DOSE AEPB Inhale 1 puff into the lungs 2 (two) times daily.  (Patient not taking: Reported on 06/19/2020)  . glucose blood test strip ACCU-CHEK AVIVA PLUS TEST STRP Use to check sugars 3 times daily.  . insulin glargine (LANTUS) 100 UNIT/ML injection Inject 0.05 mLs (5 Units total) into the skin at bedtime. (Patient not taking: Reported on 06/19/2020)  . insulin lispro (HUMALOG KWIKPEN) 100 UNIT/ML KiwkPen Inject 10 Units into the skin 3 (three) times daily. 10 units before breakfast, 8 before lunch and 18 before supper  . isosorbide mononitrate (IMDUR) 30 MG 24 hr tablet Take 30 mg by mouth daily.  . lansoprazole (PREVACID) 30 MG capsule TAKE 1 CAPSULE BY MOUTH DAILY AT NOON  . losartan (COZAAR) 100 MG tablet TAKE ONE TABLET EVERY DAY  . Magnesium 400 MG CAPS Take by mouth daily.  . meloxicam (MOBIC) 7.5 MG tablet Take 1 tablet (7.5 mg total) by mouth daily as needed for pain. (Patient not taking: Reported on 06/19/2020)  . memantine (NAMENDA) 5 MG tablet TAKE ONE TABLET TWICE DAILY  . montelukast (SINGULAIR) 10 MG tablet TAKE ONE TABLET AT BEDTIME  . pantoprazole (PROTONIX) 40 MG tablet Take 40 mg by mouth daily.  . potassium chloride (KLOR-CON) 10 MEQ tablet TAKE 1 TABLET BY MOUTH TWICE DAILY  . predniSONE (DELTASONE) 20 MG tablet Take 1 tablet (20 mg total) by mouth daily with breakfast. (Patient not taking: Reported on 06/19/2020)  . QUEtiapine (SEROQUEL) 25 MG tablet Take 1 tablet by mouth at bedtime.  Marland Kitchen Respiratory Therapy Supplies (FLUTTER) DEVI Use as directed  .  traZODone (DESYREL) 50 MG tablet TAKE 1 AND 1/2 TABLET AT BEDTIME AS NEEDED FOR SLEEP  . TUSSIN DM 100-10 MG/5ML liquid TAKE 1 TEASPOONFUL BY MOUTH EVERY 4 HOURS AS NEEDED FOR COUGH (Patient not taking: Reported on 03/18/2020)  . vitamin E 1000 UNIT capsule Take 1,000 Units by mouth daily.   No facility-administered encounter medications on file as of 07/04/2020.      Goals Addressed            This Visit's Progress   . Chronic Disease Management        Current Barriers:  . Chronic  Disease Management support and education needs related to Hypertension, Diabetes and COPD.   Case Manager Clinical Goal(s):  Marland Kitchen Over the next 90 days, patient will not be hospitalized for complications related to chronic illnesses. . Over the next 90 days, patient will take all medications as prescribed. (Caregiver to assist) . Over the next 90 days, patient will attend all scheduled medical appointments.(Caregiver to assist) . Over the next 90 days, patient will monitor blood glucose levels daily and record readings. (Caregiver to assist) . Over the next 90 days, patient will monitor blood pressure. (Caregiver to assist) . Over the next 90 days, patient will follow recommended home safety measures to prevent falls.  Interventions:   . Discussed plan for symptom management r/t Covid-19/Pneumonia. [name] reports that [name] continues to improve. She still has a persistent non productive cough. No complaints of shortness of breath. Discussed importance of taking medications and completing the antibiotics as prescribed. [name] reports that family continues to assist [name] to ensure compliance. Reports that she is ambulating frequently with her walker rollator as recommended. She is aware of need to rest when feeling fatigued or weak. Denies recent falls. She will follow-up for a repeat chest x-ray within the next few weeks.  . Discussed recent blood glucose readings. [name]'s Dexcom is set to alarm for readings less than 70mg /dl and greater than 250mg /dl. [name] recalls the Dexcom alarming twice d/t elevated readings of 254 and 260. This was likely d/t the prescribed prednisone.  Her appetite has significantly improved since our last outreach. Overall, [name] reports that she is doing well with diabetes self-management.    Patient Self Care Activities:  . Performs ADL's independently . Requires assistance with IADLs and medication preparation.   Please see past updates related to  this goal by clicking on the "Past Updates" button in the selected goal          PLAN A member of the care management team will follow up within the next month.     [facility] Care Management [facility] [phone]

## 2020-07-07 NOTE — Patient Instructions (Signed)
Thank you for allowing the Chronic Care Management team to participate in your care.   Goals Addressed            This Visit's Progress    Chronic Disease Management        Current Barriers:   Chronic Disease Management support and education needs related to Hypertension, Diabetes and COPD.   Case Manager Clinical Goal(s):   Over the next 90 days, patient will not be hospitalized for complications related to chronic illnesses.  Over the next 90 days, patient will take all medications as prescribed. (Caregiver to assist)  Over the next 90 days, patient will attend all scheduled medical appointments.(Caregiver to assist)  Over the next 90 days, patient will monitor blood glucose levels daily and record readings. (Caregiver to assist)  Over the next 90 days, patient will monitor blood pressure. (Caregiver to assist)  Over the next 90 days, patient will follow recommended home safety measures to prevent falls.  Interventions:    Discussed plan for symptom management r/t Covid-19/Pneumonia. Tammy reports that Ms. Sabo continues to improve. She still has a persistent non productive cough. No complaints of shortness of breath. Discussed importance of taking medications and completing the antibiotics as prescribed. Tammy reports that family continues to assist Ms. Bechtol to ensure compliance. Reports that she is ambulating frequently with her walker rollator as recommended. She is aware of need to rest when feeling fatigued or weak. Denies recent falls. She will follow-up for a repeat chest x-ray within the next few weeks.   Discussed recent blood glucose readings. Ms. Lagrow's Dexcom is set to alarm for readings less than 70mg /dl and greater than 250mg /dl. Tammy recalls the Dexcom alarming twice d/t elevated readings of 254 and 260. This was likely d/t the prescribed prednisone.  Her appetite has significantly improved since our last outreach. Overall, Tammy reports that she  is doing well with diabetes self-management.    Patient Self Care Activities:   Performs ADL's independently  Requires assistance with IADLs and medication preparation.   Please see past updates related to this goal by clicking on the "Past Updates" button in the selected goal        Ms. Broda's daughter Tammy verbalized understanding of the information discussed during the telephonic outreach today. Declined need for a mailed/printed copy of the instructions.    A member of the care management team will follow up within the next month.    [facility] Care Management [facility] [phone]

## 2020-07-22 ENCOUNTER — Telehealth: Payer: Self-pay

## 2020-07-22 NOTE — Telephone Encounter (Signed)
Copied from [place] [id number]. Topic: General - Call Back - No Documentation >> Jul 22, 2020  3:53 PM Erick Blinks wrote: Reason for CRM: Wants to schedule an X-Ray of left hand, pt fell and has been in pain since.   Best contact: Lavell Anchors [phone]

## 2020-07-22 NOTE — Telephone Encounter (Signed)
Please advise 

## 2020-07-23 NOTE — Telephone Encounter (Signed)
[name] returned call and scheduled appt.

## 2020-07-23 NOTE — Telephone Encounter (Signed)
Returned call to [employer]. Left message for her to call back.

## 2020-07-23 NOTE — Telephone Encounter (Signed)
She will need an appointment with someone to be evaluated.

## 2020-07-24 ENCOUNTER — Other Ambulatory Visit: Payer: Self-pay

## 2020-07-24 ENCOUNTER — Ambulatory Visit (INDEPENDENT_AMBULATORY_CARE_PROVIDER_SITE_OTHER): Payer: Medicare Other | Admitting: Family Medicine

## 2020-07-24 ENCOUNTER — Ambulatory Visit
Admission: RE | Admit: 2020-07-24 | Discharge: 2020-07-24 | Disposition: A | Discharge status: Home / Self Care | Payer: Medicare Other | Source: Ambulatory Visit | Attending: Family Medicine | Admitting: Family Medicine

## 2020-07-24 ENCOUNTER — Ambulatory Visit
Admission: RE | Admit: 2020-07-24 | Discharge: 2020-07-24 | Disposition: A | Discharge status: Home / Self Care | Payer: Medicare Other | Attending: Family Medicine | Admitting: Family Medicine

## 2020-07-24 ENCOUNTER — Ambulatory Visit: Admission: RE | Admit: 2020-07-24 | Payer: Medicare Other | Source: Home / Self Care

## 2020-07-24 ENCOUNTER — Encounter: Payer: Self-pay | Admitting: Family Medicine

## 2020-07-24 VITALS — BP 117/55 | HR 89 | Temp 98.8°F | Wt 150.0 lb

## 2020-07-24 DIAGNOSIS — M25542 Pain in joints of left hand: Secondary | ICD-10-CM

## 2020-07-24 DIAGNOSIS — M11242 Other chondrocalcinosis, left hand: Secondary | ICD-10-CM | POA: Diagnosis not present

## 2020-07-24 DIAGNOSIS — U071 COVID-19: Secondary | ICD-10-CM | POA: Insufficient documentation

## 2020-07-24 DIAGNOSIS — M19042 Primary osteoarthritis, left hand: Secondary | ICD-10-CM | POA: Diagnosis not present

## 2020-07-24 DIAGNOSIS — J1282 Pneumonia due to coronavirus disease 2019: Secondary | ICD-10-CM

## 2020-07-24 NOTE — Progress Notes (Signed)
Acute Office Visit  Subjective:    Patient ID: [name], female    DOB: [birth year], 86 y.o.   MRN: [id number]  No chief complaint on file.   HPI Patient is in today for hand pain after having a fall 1 month ago. Patient had head injury at the time and has been seen and treated at the urgent care. Her head pain is better. She did not have hand pain until later.  She thinks the hand pain is from the fall.  She would like to have the hand x-rayed.  Patient was found to have Covid and pneumonia when she went to the urgent care and needs to have follow up x-ray today  Past Medical History:  Diagnosis Date  . Angina pectoris ([place])   . Atrial fibrillation ([place])   . Basal cell carcinoma    nose  . Cervicalgia   . Chronic back pain   . Chronic obstructive pulmonary disease (COPD) ([place])   . COPD (chronic obstructive pulmonary disease) ([place])   . DDD (degenerative disc disease), lumbar   . Diabetes mellitus without complication ([place])   . Dysphagia   . Hyperlipemia   . Hypertension   . Squamous cell carcinoma of skin    left lateral pretibial  . Vulvovaginitis     Past Surgical History:  Procedure Laterality Date  . gall bladder    . melanoma     removal neck and back  . PERIPHERAL VASCULAR CATHETERIZATION Left 01/05/2016   Procedure: Lower Extremity Angiography;  Surgeon: Algernon Huxley, MD;  Location: [place] [facility];  Service: Cardiovascular;  Laterality: Left;  . PERIPHERAL VASCULAR CATHETERIZATION  01/05/2016   Procedure: Lower Extremity Intervention;  Surgeon: Algernon Huxley, MD;  Location: [place] [facility];  Service: Cardiovascular;;  . ureterolithiasis     calculus removed  . VAGINAL HYSTERECTOMY    . VISCERAL ANGIOGRAPHY N/A 05/10/2019   Procedure: VISCERAL ANGIOGRAPHY;  Surgeon: Algernon Huxley, MD;  Location: [place] [facility];  Service: Cardiovascular;  Laterality: N/A;  . VULVA / PERINEUM BIOPSY  05/29/2015    Family History  Problem Relation Age of  Onset  . Ovarian cancer Other   . Diabetes Sister   . Colon cancer Brother   . Diabetes Brother   . Atrial fibrillation Daughter   . Breast cancer Neg Hx     Social History   Socioeconomic History  . Marital status: Widowed    Spouse name: Not on file  . Number of children: 3  . Years of education: Not on file  . Highest education level: 8th grade  Occupational History  . Occupation: retired  Tobacco Use  . Smoking status: Never Smoker  . Smokeless tobacco: Never Used  Vaping Use  . Vaping Use: Never used  Substance and Sexual Activity  . Alcohol use: No  . Drug use: No  . Sexual activity: Never  Other Topics Concern  . Not on file  Social History Narrative  . Not on file   Social Determinants of Health   Financial Resource Strain: Low Risk   . Difficulty of Paying Living Expenses: Not hard at all  Food Insecurity: No Food Insecurity  . Worried About Charity fundraiser in the Last Year: Never true  . Ran Out of Food in the Last Year: Never true  Transportation Needs: No Transportation Needs  . Lack of Transportation (Medical): No  . Lack of Transportation (Non-Medical): No  Physical Activity:  Inactive  . Days of Exercise per Week: 0 days  . Minutes of Exercise per Session: 0 min  Stress: No Stress Concern Present  . Feeling of Stress : Only a little  Social Connections: Moderately Isolated  . Frequency of Communication with Friends and Family: More than three times a week  . Frequency of Social Gatherings with Friends and Family: More than three times a week  . Attends Religious Services: More than 4 times per year  . Active Member of Clubs or Organizations: No  . Attends Archivist Meetings: Never  . Marital Status: Widowed  Intimate Partner Violence: Not At Risk  . Fear of Current or Ex-Partner: No  . Emotionally Abused: No  . Physically Abused: No  . Sexually Abused: No    Outpatient Medications Prior to Visit  Medication Sig Dispense  Refill  . ADVAIR HFA 230-21 MCG/ACT inhaler TAKE 2 PUFFS INTO LUNGS TWICE A DAY 12 g 5  . albuterol (VENTOLIN HFA) 108 (90 Base) MCG/ACT inhaler INHALE 2 PUFFS INTO THE LUNGS EVERY 4 HOURS AS NEEDED FOR WHEEZING ORSHORTNESS OF BREATH 8.5 g 10  . Ascorbic Acid (VITAMIN C) 1000 MG tablet Take 1,000 mg by mouth daily.     Marland Kitchen aspirin 325 MG tablet Take 325 mg by mouth daily. Reported on 01/05/2016    . atorvastatin (LIPITOR) 10 MG tablet Take 1 tablet (10 mg total) by mouth daily. 30 tablet 11  . cetirizine (ZYRTEC) 10 MG tablet TAKE ONE TABLET BY MOUTH EVERY DAY 90 tablet 3  . cholecalciferol (VITAMIN D3) 25 MCG (1000 UT) tablet Take 1,000 Units by mouth daily.    . clopidogrel (PLAVIX) 75 MG tablet TAKE 1 TABLET BY MOUTH DAILY 30 tablet 11  . diltiazem (TIAZAC) 360 MG 24 hr capsule TAKE 1 CAPSULE EVERY DAY 90 capsule 3  . ezetimibe (ZETIA) 10 MG tablet TAKE 1 TABLET BY MOUTH DAILY 30 tablet 12  . FLUoxetine (PROZAC) 20 MG capsule Take 1 capsule (20 mg total) by mouth daily. 30 capsule 12  . Fluticasone-Salmeterol (ADVAIR DISKUS) 250-50 MCG/DOSE AEPB Inhale 1 puff into the lungs 2 (two) times daily. 14 each 6  . glucose blood test strip ACCU-CHEK AVIVA PLUS TEST STRP Use to check sugars 3 times daily.    . insulin glargine (LANTUS) 100 UNIT/ML injection Inject 0.05 mLs (5 Units total) into the skin at bedtime. 10 mL 11  . insulin lispro (HUMALOG KWIKPEN) 100 UNIT/ML KiwkPen Inject 10 Units into the skin 3 (three) times daily. 10 units before breakfast, 8 before lunch and 18 before supper    . isosorbide mononitrate (IMDUR) 30 MG 24 hr tablet Take 30 mg by mouth daily.    . lansoprazole (PREVACID) 30 MG capsule TAKE 1 CAPSULE BY MOUTH DAILY AT NOON 90 capsule 1  . losartan (COZAAR) 100 MG tablet TAKE ONE TABLET EVERY DAY 90 tablet 3  . Magnesium 400 MG CAPS Take by mouth daily.    . meloxicam (MOBIC) 7.5 MG tablet Take 1 tablet (7.5 mg total) by mouth daily as needed for pain. 30 tablet 2  . memantine  (NAMENDA) 5 MG tablet TAKE ONE TABLET TWICE DAILY 60 tablet 12  . montelukast (SINGULAIR) 10 MG tablet TAKE ONE TABLET AT BEDTIME 90 tablet 1  . pantoprazole (PROTONIX) 40 MG tablet Take 40 mg by mouth daily.    . potassium chloride (KLOR-CON) 10 MEQ tablet TAKE 1 TABLET BY MOUTH TWICE DAILY 180 tablet 1  . predniSONE (DELTASONE)  20 MG tablet Take 1 tablet (20 mg total) by mouth daily with breakfast. 10 tablet 0  . QUEtiapine (SEROQUEL) 25 MG tablet Take 1 tablet by mouth at bedtime.    Marland Kitchen Respiratory Therapy Supplies (FLUTTER) DEVI Use as directed 1 each 0  . traZODone (DESYREL) 50 MG tablet TAKE 1 AND 1/2 TABLET AT BEDTIME AS NEEDED FOR SLEEP 45 tablet 11  . TUSSIN DM 100-10 MG/5ML liquid TAKE 1 TEASPOONFUL BY MOUTH EVERY 4 HOURS AS NEEDED FOR COUGH 118 mL 0  . vitamin E 1000 UNIT capsule Take 1,000 Units by mouth daily.     No facility-administered medications prior to visit.    Allergies  Allergen Reactions  . Bacitracin-Neomycin-Polymyxin Rash  . Neomycin-Bacitracin Zn-Polymyx Swelling and Rash  . Latex Rash  . Lidocaine Rash  . Aricept [Donepezil Hcl] Diarrhea  . Benzalkonium Chloride Itching and Swelling  . Ibuprofen     Other reaction(s): Dizziness Heart fluttering. Tachycardia. Tachycardia.  . Valdecoxib Nausea And Vomiting  . Albuterol Rash  . Tape Rash    blisters  . Triamcinolone Rash    Review of Systems  Constitutional: Negative for fatigue and fever.  HENT: Positive for congestion, rhinorrhea and sneezing. Negative for sinus pressure and sore throat.   Respiratory: Positive for cough and shortness of breath. Negative for wheezing.   Gastrointestinal: Negative for abdominal pain and diarrhea.  Neurological: Negative for dizziness and headaches.       Objective:    Physical Exam Constitutional:      General: She is not in acute distress.    Appearance: She is well-developed.  HENT:     Head: Normocephalic and atraumatic.     Right Ear: Hearing normal.      Left Ear: Hearing normal.     Nose: Nose normal.  Eyes:     General: Lids are normal. No scleral icterus.       Right eye: No discharge.        Left eye: No discharge.     Conjunctiva/sclera: Conjunctivae normal.  Cardiovascular:     Rate and Rhythm: Normal rate and regular rhythm.     Heart sounds: Normal heart sounds.  Pulmonary:     Effort: Pulmonary effort is normal. No respiratory distress.  Musculoskeletal:        General: Normal range of motion.  Skin:    Findings: No lesion or rash.  Neurological:     Mental Status: She is alert and oriented to person, place, and time.  Psychiatric:        Speech: Speech normal.        Behavior: Behavior normal.        Thought Content: Thought content normal.     BP (!) 117/55 (BP Location: Right Arm, Patient Position: Sitting, Cuff Size: Normal)   Pulse 89   Temp 98.8 F (37.1 C) (Oral)   Wt 150 lb (68 kg)   SpO2 98%   BMI 26.57 kg/m  Wt Readings from Last 3 Encounters:  07/24/20 150 lb (68 kg)  03/30/20 146 lb (66.2 kg)  03/05/20 142 lb (64.4 kg)    Health Maintenance Due  Topic Date Due  . OPHTHALMOLOGY EXAM  08/01/2019  . INFLUENZA VACCINE  04/27/2020    There are no preventive care reminders to display for this patient.   Lab Results  Component Value Date   TSH 2.08 06/07/2017   Lab Results  Component Value Date   WBC 8.4 03/30/2020   HGB 9.9 (  L) 03/30/2020   HCT 30.8 (L) 03/30/2020   MCV 82.8 03/30/2020   PLT 278 03/30/2020   Lab Results  Component Value Date   NA 135 03/30/2020   K 4.1 03/30/2020   CO2 27 03/30/2020   GLUCOSE 257 (H) 03/30/2020   BUN 17 03/30/2020   CREATININE 1.12 (H) 03/30/2020   BILITOT 0.6 03/30/2020   ALKPHOS 94 03/30/2020   AST 19 03/30/2020   ALT 15 03/30/2020   PROT 6.4 (L) 03/30/2020   ALBUMIN 3.3 (L) 03/30/2020   CALCIUM 8.5 (L) 03/30/2020   ANIONGAP 10 03/30/2020   Lab Results  Component Value Date   CHOL 128 02/12/2020   Lab Results  Component Value Date    HDL 62 02/12/2020   Lab Results  Component Value Date   LDLCALC 49 02/12/2020   Lab Results  Component Value Date   TRIG 85 02/12/2020   Lab Results  Component Value Date   CHOLHDL 2.1 02/12/2020   Lab Results  Component Value Date   HGBA1C 7.9 (H) 04/05/2019      Assessment & Plan:   1. Pain in joint of left hand States she has had pain in the left hand since she fell a month ago. Recommend x-ray evaluation and recheck in a week pending reports. - DG Hand Complete Left  2. Pneumonia due to COVID-19 virus Tested positive for COVID-19 at [facility] clinic on 06-18-20. CXR at that time showed bilateral COVID pneumonia (she had [place] vaccinations on 11-01-19 and 01-18-20). She had MAB infusion on 06-23-20. No fever today. Will check labs and CXR to assess progress of pneumonia. - CBC with Differential/Platelet - Comprehensive metabolic panel - DG Chest 2 View    No orders of the defined types were placed in this encounter.  Andres Shad, PA, have reviewed all documentation for this visit. The documentation on 07/24/20 for the exam, diagnosis, procedures, and orders are all accurate and complete.  Juluis Mire, CMA

## 2020-07-25 LAB — COMPREHENSIVE METABOLIC PANEL
ALT: 11 IU/L (ref 0–32)
AST: 15 IU/L (ref 0–40)
Albumin/Globulin Ratio: 1.8 (ref 1.2–2.2)
Albumin: 4.2 g/dL (ref 3.6–4.6)
Alkaline Phosphatase: 105 IU/L (ref 44–121)
BUN/Creatinine Ratio: 11 — ABNORMAL LOW (ref 12–28)
BUN: 13 mg/dL (ref 8–27)
Bilirubin Total: 0.3 mg/dL (ref 0.0–1.2)
CO2: 23 mmol/L (ref 20–29)
Calcium: 8.8 mg/dL (ref 8.7–10.3)
Chloride: 101 mmol/L (ref 96–106)
Creatinine, Ser: 1.16 mg/dL — ABNORMAL HIGH (ref 0.57–1.00)
GFR calc Af Amer: 48 mL/min/{1.73_m2} — ABNORMAL LOW (ref >59)
GFR calc non Af Amer: 42 mL/min/{1.73_m2} — ABNORMAL LOW (ref >59)
Globulin, Total: 2.4 g/dL (ref 1.5–4.5)
Glucose: 46 mg/dL — ABNORMAL LOW (ref 65–99)
Potassium: 4.4 mmol/L (ref 3.5–5.2)
Sodium: 139 mmol/L (ref 134–144)
Total Protein: 6.6 g/dL (ref 6.0–8.5)

## 2020-07-25 LAB — CBC WITH DIFFERENTIAL/PLATELET
Basophils Absolute: 0.1 10*3/uL (ref 0.0–0.2)
Basos: 1 %
EOS (ABSOLUTE): 0.3 10*3/uL (ref 0.0–0.4)
Eos: 4 %
Hematocrit: 32.3 % — ABNORMAL LOW (ref 34.0–46.6)
Hemoglobin: 10 g/dL — ABNORMAL LOW (ref 11.1–15.9)
Immature Grans (Abs): 0 10*3/uL (ref 0.0–0.1)
Immature Granulocytes: 0 %
Lymphocytes Absolute: 2.5 10*3/uL (ref 0.7–3.1)
Lymphs: 29 %
MCH: 24.3 pg — ABNORMAL LOW (ref 26.6–33.0)
MCHC: 31 g/dL — ABNORMAL LOW (ref 31.5–35.7)
MCV: 78 fL — ABNORMAL LOW (ref 79–97)
Monocytes Absolute: 1.2 10*3/uL — ABNORMAL HIGH (ref 0.1–0.9)
Monocytes: 13 %
Neutrophils Absolute: 4.7 10*3/uL (ref 1.4–7.0)
Neutrophils: 53 %
Platelets: 293 10*3/uL (ref 150–450)
RBC: 4.12 x10E6/uL (ref 3.77–5.28)
RDW: 16.8 % — ABNORMAL HIGH (ref 11.7–15.4)
WBC: 8.7 10*3/uL (ref 3.4–10.8)

## 2020-07-28 ENCOUNTER — Telehealth: Payer: Self-pay

## 2020-07-28 NOTE — Telephone Encounter (Signed)
Copied from [place] [id number]. Topic: General - Other >> Jul 28, 2020 11:54 AM Greggory Keen D wrote: Reason for CRM: Pt's daughter [name] called asking if mom could get her Flu vaccine and covid booster together CB# [phone]

## 2020-07-29 NOTE — Telephone Encounter (Signed)
It is ok for patient to get them together.

## 2020-07-29 NOTE — Telephone Encounter (Signed)
Spoke with Tammy and relayed what [name] said.

## 2020-08-03 ENCOUNTER — Encounter: Payer: Self-pay | Admitting: Family Medicine

## 2020-08-06 ENCOUNTER — Ambulatory Visit: Payer: Medicare Other

## 2020-08-15 DIAGNOSIS — I6523 Occlusion and stenosis of bilateral carotid arteries: Secondary | ICD-10-CM | POA: Diagnosis not present

## 2020-08-15 DIAGNOSIS — I10 Essential (primary) hypertension: Secondary | ICD-10-CM | POA: Diagnosis not present

## 2020-08-15 DIAGNOSIS — I2510 Atherosclerotic heart disease of native coronary artery without angina pectoris: Secondary | ICD-10-CM | POA: Diagnosis not present

## 2020-08-15 DIAGNOSIS — I700 Atherosclerosis of aorta: Secondary | ICD-10-CM | POA: Diagnosis not present

## 2020-08-15 DIAGNOSIS — Z23 Encounter for immunization: Secondary | ICD-10-CM | POA: Diagnosis not present

## 2020-08-19 ENCOUNTER — Telehealth: Payer: Self-pay

## 2020-08-19 NOTE — Telephone Encounter (Signed)
°  Chronic Care Management   Outreach Note  08/19/2020 Name: [name] MRN: [id number] DOB: 21-Feb-1929  Primary Care Provider: Jerrol Banana., MD Reason for referral : Chronic Care Management   An unsuccessful telephone outreach was attempted today. [name] is currently engaged with the chronic care management team.    Follow Up Plan: A HIPAA compliant voice message was left today with [name] requesting a return call.    Cristy [facility] Care Management [facility] [phone]

## 2020-08-26 DIAGNOSIS — I480 Paroxysmal atrial fibrillation: Secondary | ICD-10-CM | POA: Diagnosis not present

## 2020-08-26 DIAGNOSIS — R2689 Other abnormalities of gait and mobility: Secondary | ICD-10-CM | POA: Diagnosis not present

## 2020-08-26 DIAGNOSIS — F32A Depression, unspecified: Secondary | ICD-10-CM | POA: Diagnosis not present

## 2020-09-03 DIAGNOSIS — N1832 Chronic kidney disease, stage 3b: Secondary | ICD-10-CM | POA: Diagnosis not present

## 2020-09-03 DIAGNOSIS — E1122 Type 2 diabetes mellitus with diabetic chronic kidney disease: Secondary | ICD-10-CM | POA: Diagnosis not present

## 2020-09-03 DIAGNOSIS — Z794 Long term (current) use of insulin: Secondary | ICD-10-CM | POA: Diagnosis not present

## 2020-09-08 DIAGNOSIS — E1165 Type 2 diabetes mellitus with hyperglycemia: Secondary | ICD-10-CM | POA: Diagnosis not present

## 2020-09-08 DIAGNOSIS — Z794 Long term (current) use of insulin: Secondary | ICD-10-CM | POA: Diagnosis not present

## 2020-09-09 ENCOUNTER — Other Ambulatory Visit: Payer: Self-pay

## 2020-09-09 ENCOUNTER — Ambulatory Visit (INDEPENDENT_AMBULATORY_CARE_PROVIDER_SITE_OTHER): Payer: Medicare Other

## 2020-09-09 ENCOUNTER — Encounter (INDEPENDENT_AMBULATORY_CARE_PROVIDER_SITE_OTHER): Payer: Self-pay | Admitting: Vascular Surgery

## 2020-09-09 ENCOUNTER — Ambulatory Visit (INDEPENDENT_AMBULATORY_CARE_PROVIDER_SITE_OTHER): Payer: Medicare Other | Admitting: Vascular Surgery

## 2020-09-09 VITALS — BP 187/74 | HR 82 | Resp 16 | Wt 147.4 lb

## 2020-09-09 DIAGNOSIS — I6523 Occlusion and stenosis of bilateral carotid arteries: Secondary | ICD-10-CM

## 2020-09-09 DIAGNOSIS — I10 Essential (primary) hypertension: Secondary | ICD-10-CM

## 2020-09-09 DIAGNOSIS — I771 Stricture of artery: Secondary | ICD-10-CM

## 2020-09-09 DIAGNOSIS — I739 Peripheral vascular disease, unspecified: Secondary | ICD-10-CM

## 2020-09-09 DIAGNOSIS — N1832 Chronic kidney disease, stage 3b: Secondary | ICD-10-CM | POA: Diagnosis not present

## 2020-09-09 DIAGNOSIS — I774 Celiac artery compression syndrome: Secondary | ICD-10-CM | POA: Diagnosis not present

## 2020-09-09 DIAGNOSIS — E1159 Type 2 diabetes mellitus with other circulatory complications: Secondary | ICD-10-CM | POA: Diagnosis not present

## 2020-09-09 DIAGNOSIS — E1129 Type 2 diabetes mellitus with other diabetic kidney complication: Secondary | ICD-10-CM | POA: Diagnosis not present

## 2020-09-09 DIAGNOSIS — E1122 Type 2 diabetes mellitus with diabetic chronic kidney disease: Secondary | ICD-10-CM | POA: Diagnosis not present

## 2020-09-09 DIAGNOSIS — E1142 Type 2 diabetes mellitus with diabetic polyneuropathy: Secondary | ICD-10-CM

## 2020-09-09 DIAGNOSIS — Z794 Long term (current) use of insulin: Secondary | ICD-10-CM | POA: Diagnosis not present

## 2020-09-09 NOTE — Progress Notes (Signed)
MRN : [id number]  [name] is a 86 y.o. (1929-08-27) female who presents with chief complaint of  Chief Complaint  Patient presents with  . Follow-up    19monthultrasound follow up  .  History of Present Illness: Patient returns today in follow up of multiple vascular issues.  She is a little over a year status post celiac artery intervention for high-grade stenosis with chronic visceral symptoms.  She is doing well.  Rare abdominal pain that does not appear to be postprandial without food fear or weight loss. Duplex today shows her celiac artery to have normal velocities with good flow.  Her SMA does have some elevated velocities that would suggest a greater than 70% stenosis which is slightly increased from her study 6 months ago. She is also followed today for her carotid disease.  She has had previous mini strokes she says in the past but never had any residual deficits.  No arm or leg weakness or numbness or speech or swallowing difficulty.  Her duplex today does show progression of her disease on the left now into the 80 to 99% range.  Previously this was moderate.  The right side remains in the 40 to 59% range.  Current Outpatient Medications  Medication Sig Dispense Refill  . ADVAIR HFA 230-21 MCG/ACT inhaler TAKE 2 PUFFS INTO LUNGS TWICE A DAY 12 g 5  . albuterol (VENTOLIN HFA) 108 (90 Base) MCG/ACT inhaler INHALE 2 PUFFS INTO THE LUNGS EVERY 4 HOURS AS NEEDED FOR WHEEZING ORSHORTNESS OF BREATH 8.5 g 10  . Ascorbic Acid (VITAMIN C) 1000 MG tablet Take 1,000 mg by mouth daily.     .Marland Kitchenaspirin 325 MG tablet Take 325 mg by mouth daily. Reported on 01/05/2016    . atorvastatin (LIPITOR) 10 MG tablet Take 1 tablet (10 mg total) by mouth daily. 30 tablet 11  . cetirizine (ZYRTEC) 10 MG tablet TAKE ONE TABLET BY MOUTH EVERY DAY 90 tablet 3  . cholecalciferol (VITAMIN D3) 25 MCG (1000 UT) tablet Take 1,000 Units by mouth daily.    . clopidogrel (PLAVIX) 75 MG tablet TAKE 1 TABLET  BY MOUTH DAILY 30 tablet 11  . diltiazem (TIAZAC) 360 MG 24 hr capsule TAKE 1 CAPSULE EVERY DAY 90 capsule 3  . ezetimibe (ZETIA) 10 MG tablet TAKE 1 TABLET BY MOUTH DAILY 30 tablet 12  . FLUoxetine (PROZAC) 20 MG capsule Take 1 capsule (20 mg total) by mouth daily. 30 capsule 12  . Fluticasone-Salmeterol (ADVAIR DISKUS) 250-50 MCG/DOSE AEPB Inhale 1 puff into the lungs 2 (two) times daily. 14 each 6  . glucose blood test strip ACCU-CHEK AVIVA PLUS TEST STRP Use to check sugars 3 times daily.    . insulin glargine (LANTUS) 100 UNIT/ML injection Inject 0.05 mLs (5 Units total) into the skin at bedtime. 10 mL 11  . insulin lispro (HUMALOG) 100 UNIT/ML KiwkPen Inject 10 Units into the skin 3 (three) times daily. 10 units before breakfast, 8 before lunch and 18 before supper    . isosorbide mononitrate (IMDUR) 30 MG 24 hr tablet Take 30 mg by mouth daily.    . lansoprazole (PREVACID) 30 MG capsule TAKE 1 CAPSULE BY MOUTH DAILY AT NOON 90 capsule 1  . losartan (COZAAR) 100 MG tablet TAKE ONE TABLET EVERY DAY 90 tablet 3  . Magnesium 400 MG CAPS Take by mouth daily.    . meloxicam (MOBIC) 7.5 MG tablet Take 1 tablet (7.5 mg total) by mouth daily as  needed for pain. 30 tablet 2  . memantine (NAMENDA) 5 MG tablet TAKE ONE TABLET TWICE DAILY 60 tablet 12  . montelukast (SINGULAIR) 10 MG tablet TAKE ONE TABLET AT BEDTIME 90 tablet 1  . pantoprazole (PROTONIX) 40 MG tablet Take 40 mg by mouth daily.    . potassium chloride (KLOR-CON) 10 MEQ tablet TAKE 1 TABLET BY MOUTH TWICE DAILY 180 tablet 1  . predniSONE (DELTASONE) 20 MG tablet Take 1 tablet (20 mg total) by mouth daily with breakfast. 10 tablet 0  . QUEtiapine (SEROQUEL) 25 MG tablet Take 1 tablet by mouth at bedtime.    Marland Kitchen Respiratory Therapy Supplies (FLUTTER) DEVI Use as directed 1 each 0  . traZODone (DESYREL) 50 MG tablet TAKE 1 AND 1/2 TABLET AT BEDTIME AS NEEDED FOR SLEEP 45 tablet 11  . TUSSIN DM 100-10 MG/5ML liquid TAKE 1 TEASPOONFUL BY  MOUTH EVERY 4 HOURS AS NEEDED FOR COUGH 118 mL 0  . vitamin E 1000 UNIT capsule Take 1,000 Units by mouth daily.     No current facility-administered medications for this visit.    Past Medical History:  Diagnosis Date  . Angina pectoris ([place])   . Atrial fibrillation ([place])   . Basal cell carcinoma    nose  . Cervicalgia   . Chronic back pain   . Chronic obstructive pulmonary disease (COPD) ([place])   . COPD (chronic obstructive pulmonary disease) ([place])   . DDD (degenerative disc disease), lumbar   . Diabetes mellitus without complication ([place])   . Dysphagia   . Hyperlipemia   . Hypertension   . Squamous cell carcinoma of skin    left lateral pretibial  . Vulvovaginitis     Past Surgical History:  Procedure Laterality Date  . gall bladder    . melanoma     removal neck and back  . PERIPHERAL VASCULAR CATHETERIZATION Left 01/05/2016   Procedure: Lower Extremity Angiography;  Surgeon: Algernon Huxley, MD;  Location: [facility];  Service: Cardiovascular;  Laterality: Left;  . PERIPHERAL VASCULAR CATHETERIZATION  01/05/2016   Procedure: Lower Extremity Intervention;  Surgeon: Algernon Huxley, MD;  Location: [facility];  Service: Cardiovascular;;  . ureterolithiasis     calculus removed  . VAGINAL HYSTERECTOMY    . VISCERAL ANGIOGRAPHY N/A 05/10/2019   Procedure: VISCERAL ANGIOGRAPHY;  Surgeon: Algernon Huxley, MD;  Location: [facility];  Service: Cardiovascular;  Laterality: N/A;  . VULVA / PERINEUM BIOPSY  05/29/2015     Social History   Tobacco Use  . Smoking status: Never Smoker  . Smokeless tobacco: Never Used  Vaping Use  . Vaping Use: Never used  Substance Use Topics  . Alcohol use: No  . Drug use: No      Family History  Problem Relation Age of Onset  . Ovarian cancer Other   . Diabetes Sister   . Colon cancer Brother   . Diabetes Brother   . Atrial fibrillation Daughter   . Breast cancer Neg Hx      Allergies  Allergen Reactions  .  Bacitracin-Neomycin-Polymyxin Rash  . Neomycin-Bacitracin Zn-Polymyx Swelling and Rash  . Latex Rash  . Lidocaine Rash  . Aricept [Donepezil Hcl] Diarrhea  . Benzalkonium Chloride Itching and Swelling  . Ibuprofen     Other reaction(s): Dizziness Heart fluttering. Tachycardia. Tachycardia.  . Valdecoxib Nausea And Vomiting  . Albuterol Rash  . Tape Rash    blisters  . Triamcinolone Rash  REVIEW OF SYSTEMS(Negative unless checked)  Constitutional: [] ??Weight loss[] ??Fever[] ??Chills Cardiac:[] ??Chest pain[] ??Chest pressure[] ??Palpitations [] ??Shortness of breath when laying flat [] ??Shortness of breath at rest [] ??Shortness of breath with exertion. Vascular: [x] ??Pain in legs with walking[x] ??Pain in legsat rest[] ??Pain in legs when laying flat [] ??Claudication [] ??Pain in feet when walking [] ??Pain in feet at rest [] ??Pain in feet when laying flat [] ??History of DVT [] ??Phlebitis [] ??Swelling in legs [] ??Varicose veins [] ??Non-healing ulcers Pulmonary: [] ??Uses home oxygen [] ??Productive cough[] ??Hemoptysis [] ??Wheeze [] ??COPD [] ??Asthma Neurologic: [x] ??Dizziness [] ??Blackouts [] ??Seizures [] ??History of stroke [x] ??History of TIA[] ??Aphasia [] ??Temporary blindness[] ??Dysphagia [] ??Weaknessor numbness in arms [] ??Weakness or numbnessin legs Musculoskeletal: [x] ??Arthritis [] ??Joint swelling [x] ??Joint pain [] ??Low back pain Hematologic:[x] ??Easy bruising[] ??Easy bleeding [] ??Hypercoagulable state [] ??Anemic  Gastrointestinal:[] ??Blood in stool[] ??Vomiting blood[] ??Gastroesophageal reflux/heartburn[] ??Abdominal pain Genitourinary: [] ??Chronic kidney disease [] ??Difficulturination [] ??Frequenturination [] ??Burning with urination[] ??Hematuria Skin: [] ??Rashes [] ??Ulcers [] ??Wounds Psychological: [] ??History of anxiety[] ??History of major depression.  Physical  Examination  BP (!) 187/74 (BP Location: Left Arm)   Pulse 82   Resp 16   Wt 147 lb 6.4 oz (66.9 kg)   BMI 26.11 kg/m  Gen:  WD/WN, NAD  Head: [place]/AT, No temporalis wasting. Ear/Nose/Throat: Hearing grossly intact, nares w/o erythema or drainage Eyes: Conjunctiva clear. Sclera non-icteric Neck: Supple.  Trachea midline Pulmonary:  Good air movement, no use of accessory muscles.  Cardiac: RRR, no JVD Vascular:  Vessel Right Left  Radial Palpable Palpable       Gastrointestinal: soft, non-tender/non-distended. No guarding/reflex.  Musculoskeletal: M/S 5/5 throughout.  No deformity or atrophy.  Trace lower extremity edema. Neurologic: Sensation grossly intact in extremities.  Symmetrical.  Speech is fluent.  Psychiatric: Judgment intact, Mood & affect appropriate for pt's clinical situation. Dermatologic: No rashes or ulcers noted.  No cellulitis or open wounds.       Labs Recent Results (from the past 2160 hour(s))  CBC with Differential/Platelet     Status: Abnormal   Collection Time: 07/24/20  3:36 PM  Result Value Ref Range   WBC 8.7 3.4 - 10.8 x10E3/uL   RBC 4.12 3.77 - 5.28 x10E6/uL   Hemoglobin 10.0 (L) 11.1 - 15.9 g/dL   Hematocrit 32.3 (L) 34.0 - 46.6 %   MCV 78 (L) 79 - 97 fL   MCH 24.3 (L) 26.6 - 33.0 pg   MCHC 31.0 (L) 31.5 - 35.7 g/dL   RDW 16.8 (H) 11.7 - 15.4 %   Platelets 293 150 - 450 x10E3/uL   Neutrophils 53 Not Estab. %   Lymphs 29 Not Estab. %   Monocytes 13 Not Estab. %   Eos 4 Not Estab. %   Basos 1 Not Estab. %   Neutrophils Absolute 4.7 1.4 - 7.0 x10E3/uL   Lymphocytes Absolute 2.5 0.7 - 3.1 x10E3/uL   Monocytes Absolute 1.2 (H) 0.1 - 0.9 x10E3/uL   EOS (ABSOLUTE) 0.3 0.0 - 0.4 x10E3/uL   Basophils Absolute 0.1 0.0 - 0.2 x10E3/uL   Immature Granulocytes 0 Not Estab. %   Immature Grans (Abs) 0.0 0.0 - 0.1 x10E3/uL  Comprehensive metabolic panel     Status: Abnormal   Collection Time: 07/24/20  3:36 PM  Result Value Ref Range   Glucose 46  (L) 65 - 99 mg/dL   BUN 13 8 - 27 mg/dL   Creatinine, Ser 1.16 (H) 0.57 - 1.00 mg/dL   GFR calc non Af Amer 42 (L) >59 mL/min/1.73   GFR calc Af Amer 48 (L) >59 mL/min/1.73    Comment: **In accordance with recommendations from the NKF-ASN Task force,**   [facility] is in the process of updating its  eGFR calculation to the   2021 CKD-EPI creatinine equation that estimates kidney function   without a race variable.    BUN/Creatinine Ratio 11 (L) 12 - 28   Sodium 139 134 - 144 mmol/L   Potassium 4.4 3.5 - 5.2 mmol/L   Chloride 101 96 - 106 mmol/L   CO2 23 20 - 29 mmol/L   Calcium 8.8 8.7 - 10.3 mg/dL   Total Protein 6.6 6.0 - 8.5 g/dL   Albumin 4.2 3.6 - 4.6 g/dL   Globulin, Total 2.4 1.5 - 4.5 g/dL   Albumin/Globulin Ratio 1.8 1.2 - 2.2   Bilirubin Total 0.3 0.0 - 1.2 mg/dL   Alkaline Phosphatase 105 44 - 121 IU/L    Comment:               **Please note reference interval change**   AST 15 0 - 40 IU/L   ALT 11 0 - 32 IU/L    Radiology No results found.  Assessment/Plan Type 2 diabetes mellitus (HCC) blood glucose control important in reducing the progression of atherosclerotic disease. Also, involved in wound healing. On appropriate medications.   Benign essential HTN blood pressure control important in reducing the progression of atherosclerotic disease. On appropriate oral medications.  Celiac artery stenosis (HCC) Duplex today shows her celiac artery to have normal velocities with good flow.  Her SMA does have some elevated velocities that would suggest a greater than 70% stenosis which is slightly increased from her study 6 months ago.  No current worrisome symptoms of chronic visceral ischemia.  No postprandial abdominal pain, food fear, or weight loss.  No intervention be planned at this time and we will continue to follow her with duplex and I will see her back in 6 months.  Carotid artery narrowing Her duplex today does show progression of her disease on the left now  into the 80 to 99% range.  Previously this was moderate.  The right side remains in the 40 to 59% range. We had a long talk today with she and her son about this finding.  We discussed that her risk of stroke over the next 5 years would be quoted somewhere in the 15 to 25% range with medical management alone.  With intervention, that would be expected to drop in the 8 to 10% range.  She has been told she cannot be put to sleep, and this is certainly a possibility given her advanced age and other medical comorbidities.  We could consider percutaneous intervention which would not require general anesthesia but would require CT scan or other imaging to assess for anatomic feasibility.  She is fairly comfortable that she does not want to have anything done no matter what and would prefer medical management alone.  She and her son are both very comfortable with this decision and I am perfectly okay given her advanced age managing this medically if they are comfortable with this.  They will call our office if they change their mind, otherwise I will plan on seeing her back in 6 months.    Leotis Pain, MD  09/09/2020 11:26 AM    This note was created with Dragon medical transcription system.  Any errors from dictation are purely unintentional

## 2020-09-09 NOTE — Assessment & Plan Note (Signed)
Her duplex today does show progression of her disease on the left now into the 80 to 99% range.  Previously this was moderate.  The right side remains in the 40 to 59% range. We had a long talk today with she and her son about this finding.  We discussed that her risk of stroke over the next 5 years would be quoted somewhere in the 15 to 25% range with medical management alone.  With intervention, that would be expected to drop in the 8 to 10% range.  She has been told she cannot be put to sleep, and this is certainly a possibility given her advanced age and other medical comorbidities.  We could consider percutaneous intervention which would not require general anesthesia but would require CT scan or other imaging to assess for anatomic feasibility.  She is fairly comfortable that she does not want to have anything done no matter what and would prefer medical management alone.  She and her son are both very comfortable with this decision and I am perfectly okay given her advanced age managing this medically if they are comfortable with this.  They will call our office if they change their mind, otherwise I will plan on seeing her back in 6 months.

## 2020-09-09 NOTE — Assessment & Plan Note (Addendum)
Duplex today shows her celiac artery to have normal velocities with good flow.  Her SMA does have some elevated velocities that would suggest a greater than 70% stenosis which is slightly increased from her study 6 months ago.  No current worrisome symptoms of chronic visceral ischemia.  No postprandial abdominal pain, food fear, or weight loss.  No intervention be planned at this time and we will continue to follow her with duplex and I will see her back in 6 months.

## 2020-09-12 ENCOUNTER — Telehealth: Payer: Self-pay | Admitting: Family Medicine

## 2020-09-12 NOTE — Telephone Encounter (Signed)
[facility] faxed refill request for the following medications:   lansoprazole (PREVACID) 30 MG capsule   Please advise. Thanks, [employer]

## 2020-09-15 MED ORDER — LANSOPRAZOLE 30 MG PO CPDR: DELAYED_RELEASE_CAPSULE | ORAL | 1 refills | Status: DC

## 2020-09-16 ENCOUNTER — Encounter: Payer: Self-pay | Admitting: Family Medicine

## 2020-09-16 NOTE — Telephone Encounter (Signed)
Next week is fine--3 months after covid infection--thanks

## 2020-09-27 DIAGNOSIS — E1151 Type 2 diabetes mellitus with diabetic peripheral angiopathy without gangrene: Secondary | ICD-10-CM | POA: Insufficient documentation

## 2020-09-27 DIAGNOSIS — Z85820 Personal history of malignant melanoma of skin: Secondary | ICD-10-CM | POA: Insufficient documentation

## 2020-09-27 DIAGNOSIS — Z794 Long term (current) use of insulin: Secondary | ICD-10-CM | POA: Insufficient documentation

## 2020-10-12 ENCOUNTER — Encounter: Payer: Self-pay | Admitting: Family Medicine

## 2020-10-14 ENCOUNTER — Other Ambulatory Visit: Payer: Self-pay | Admitting: Family Medicine

## 2020-10-14 ENCOUNTER — Ambulatory Visit (INDEPENDENT_AMBULATORY_CARE_PROVIDER_SITE_OTHER): Payer: Medicare Other

## 2020-10-14 DIAGNOSIS — I10 Essential (primary) hypertension: Secondary | ICD-10-CM

## 2020-10-14 DIAGNOSIS — E119 Type 2 diabetes mellitus without complications: Secondary | ICD-10-CM | POA: Diagnosis not present

## 2020-10-14 NOTE — Chronic Care Management (AMB) (Signed)
Chronic Care Management   Follow Up Note   10/14/2020 Name: [name] MRN: [id number] DOB: 21-Oct-1928  Primary Care Provider: Jerrol Banana., MD Reason for referral : Chronic Care Management   [name] is a 87 y.o. year old female who is a primary care patient of Jerrol Banana., MD. She is currently engaged with the chronic care management team. A telephonic outreach today was conducted with her caregiver/daughter Tammy.  Review of [name]'s status, including review of consultants reports, relevant labs and test results was conducted today. Collaboration with appropriate care team members was performed as part of the comprehensive evaluation and provision of chronic care management services.    SDOH (Social Determinants of Health) assessments performed: No     Outpatient Encounter Medications as of 10/14/2020  Medication Sig  . ADVAIR HFA 230-21 MCG/ACT inhaler TAKE 2 PUFFS INTO LUNGS TWICE A DAY  . albuterol (VENTOLIN HFA) 108 (90 Base) MCG/ACT inhaler INHALE 2 PUFFS INTO THE LUNGS EVERY 4 HOURS AS NEEDED FOR WHEEZING ORSHORTNESS OF BREATH  . Ascorbic Acid (VITAMIN C) 1000 MG tablet Take 1,000 mg by mouth daily.   Marland Kitchen aspirin 325 MG tablet Take 325 mg by mouth daily. Reported on 01/05/2016  . atorvastatin (LIPITOR) 10 MG tablet Take 1 tablet (10 mg total) by mouth daily.  . cetirizine (ZYRTEC) 10 MG tablet TAKE ONE TABLET BY MOUTH EVERY DAY  . cholecalciferol (VITAMIN D3) 25 MCG (1000 UT) tablet Take 1,000 Units by mouth daily.  . clopidogrel (PLAVIX) 75 MG tablet TAKE 1 TABLET BY MOUTH DAILY  . ezetimibe (ZETIA) 10 MG tablet TAKE 1 TABLET BY MOUTH DAILY  . FLUoxetine (PROZAC) 20 MG capsule Take 1 capsule (20 mg total) by mouth daily.  . Fluticasone-Salmeterol (ADVAIR DISKUS) 250-50 MCG/DOSE AEPB Inhale 1 puff into the lungs 2 (two) times daily.  Marland Kitchen glucose blood test strip ACCU-CHEK AVIVA PLUS TEST STRP Use to check sugars 3 times daily.  .  insulin glargine (LANTUS) 100 UNIT/ML injection Inject 0.05 mLs (5 Units total) into the skin at bedtime.  . insulin lispro (HUMALOG) 100 UNIT/ML KiwkPen Inject 10 Units into the skin 3 (three) times daily. 10 units before breakfast, 8 before lunch and 18 before supper  . isosorbide mononitrate (IMDUR) 30 MG 24 hr tablet Take 30 mg by mouth daily.  . lansoprazole (PREVACID) 30 MG capsule TAKE 1 CAPSULE BY MOUTH DAILY AT NOON  . losartan (COZAAR) 100 MG tablet TAKE ONE TABLET EVERY DAY  . Magnesium 400 MG CAPS Take by mouth daily.  . meloxicam (MOBIC) 7.5 MG tablet Take 1 tablet (7.5 mg total) by mouth daily as needed for pain.  . memantine (NAMENDA) 5 MG tablet TAKE ONE TABLET TWICE DAILY  . montelukast (SINGULAIR) 10 MG tablet TAKE ONE TABLET AT BEDTIME  . pantoprazole (PROTONIX) 40 MG tablet Take 40 mg by mouth daily.  . potassium chloride (KLOR-CON) 10 MEQ tablet TAKE 1 TABLET BY MOUTH TWICE DAILY  . predniSONE (DELTASONE) 20 MG tablet Take 1 tablet (20 mg total) by mouth daily with breakfast.  . QUEtiapine (SEROQUEL) 25 MG tablet Take 1 tablet by mouth at bedtime.  Marland Kitchen Respiratory Therapy Supplies (FLUTTER) DEVI Use as directed  . TIADYLT ER 360 MG 24 hr capsule TAKE 1 CAPSULE EVERY DAY  . traZODone (DESYREL) 50 MG tablet TAKE 1 AND 1/2 TABLET AT BEDTIME AS NEEDED FOR SLEEP  . TUSSIN DM 100-10 MG/5ML liquid TAKE 1 TEASPOONFUL BY MOUTH EVERY  4 HOURS AS NEEDED FOR COUGH  . vitamin E 1000 UNIT capsule Take 1,000 Units by mouth daily.   No facility-administered encounter medications on file as of 10/14/2020.     Objective:  Goals Addressed            This Visit's Progress   . Chronic Disease Management        Current Barriers:  . Chronic Disease Management support and education needs related to Hypertension, Diabetes and COPD.   Case Manager Clinical Goal(s):  Marland Kitchen Over the next 90 days, patient will not be hospitalized for complications related to chronic illnesses. . Over the next 90  days, patient will take all medications as prescribed. (Caregiver to assist) . Over the next 90 days, patient will attend all scheduled medical appointments.(Caregiver to assist) . Over the next 90 days, patient will monitor blood glucose levels daily and record readings. (Caregiver to assist) . Over the next 90 days, patient will monitor blood pressure. (Caregiver to assist) . Over the next 90 days, patient will follow recommended home safety measures to prevent falls.  Interventions:    . Discussed recent blood glucose readings. Discussed recent readings with [name]. Reports that her DexCom readings have mostly been within range. [name] has a back up glucometer with audio cues due to decreased vision. Tammy confirmed that she continues to use the glucometer as needed to verify exact glucose readings. Reports good nutritional intake over the past few weeks. The family continues to assist with ensuring medications are administered as prescribed.  . Discussed home safety and long term plan for in-home assistance. Tammy reports recent concerns with [name]'s safety in the home. Reports that she is still able to perform several tasks independently but has required more assistance over the past few weeks. Notes that she has experienced episodes of hallucinations and her vision continues to be a concern. We discussed options for Assisted Living and long term in-home assistance. The current plan is to keep her in the home as long as possible, however placement or long term assistance may be required if she is unable to safely complete ADL's. Tammy agreed to update the team with the family's plan for long term care.   Patient Self Care Activities:  . Performs ADL's independently . Requires assistance with IADLs and medication preparation.   Please see past updates related to this goal by clicking on the "Past Updates" button in the selected goal          PLAN A  member of the care management team will follow-up next month.    [facility] [phone]

## 2020-10-14 NOTE — Telephone Encounter (Signed)
Will need a visit for this to be evaluated.

## 2020-10-15 ENCOUNTER — Other Ambulatory Visit: Payer: Self-pay | Admitting: Family Medicine

## 2020-10-15 DIAGNOSIS — E876 Hypokalemia: Secondary | ICD-10-CM

## 2020-10-16 ENCOUNTER — Ambulatory Visit (INDEPENDENT_AMBULATORY_CARE_PROVIDER_SITE_OTHER): Payer: Medicare Other | Admitting: Family Medicine

## 2020-10-16 ENCOUNTER — Ambulatory Visit
Admission: RE | Admit: 2020-10-16 | Discharge: 2020-10-16 | Disposition: A | Discharge status: Home / Self Care | Payer: Medicare Other | Source: Ambulatory Visit | Attending: Family Medicine | Admitting: Family Medicine

## 2020-10-16 ENCOUNTER — Other Ambulatory Visit: Payer: Self-pay

## 2020-10-16 ENCOUNTER — Encounter: Payer: Self-pay | Admitting: Family Medicine

## 2020-10-16 VITALS — BP 148/80 | HR 98 | Temp 98.4°F | Resp 18 | Wt 149.0 lb

## 2020-10-16 DIAGNOSIS — M25522 Pain in left elbow: Secondary | ICD-10-CM | POA: Insufficient documentation

## 2020-10-16 DIAGNOSIS — M25512 Pain in left shoulder: Secondary | ICD-10-CM

## 2020-10-16 DIAGNOSIS — M19012 Primary osteoarthritis, left shoulder: Secondary | ICD-10-CM | POA: Diagnosis not present

## 2020-10-16 NOTE — Progress Notes (Signed)
Established patient visit   Patient: [name]   DOB: Feb 06, 1929   87 y.o. Female  MRN: [id number] Visit Date: 10/16/2020  Today's healthcare provider: Vernie Murders, PA-C   Chief Complaint  Patient presents with  . Arm Pain   Subjective    Arm Pain  Incident onset: 5 months ago. The incident occurred at home. The injury mechanism was a fall. The pain is present in the left shoulder and left elbow. Quality: sharp. The pain is severe. The pain has been constant since the incident. Associated symptoms include muscle weakness, numbness (in fingers and hands) and tingling. Pertinent negatives include no chest pain. She has tried NSAIDs for the symptoms. The treatment provided no relief.     Past Medical History:  Diagnosis Date  . Angina pectoris ([place])   . Atrial fibrillation ([place])   . Basal cell carcinoma    nose  . Cervicalgia   . Chronic back pain   . Chronic obstructive pulmonary disease (COPD) ([place])   . COPD (chronic obstructive pulmonary disease) ([place])   . DDD (degenerative disc disease), lumbar   . Diabetes mellitus without complication ([place])   . Dysphagia   . Hyperlipemia   . Hypertension   . Squamous cell carcinoma of skin    left lateral pretibial  . Vulvovaginitis    Past Surgical History:  Procedure Laterality Date  . gall bladder    . melanoma     removal neck and back  . PERIPHERAL VASCULAR CATHETERIZATION Left 01/05/2016   Procedure: Lower Extremity Angiography;  Surgeon: Algernon Huxley, MD;  Location: [facility];  Service: Cardiovascular;  Laterality: Left;  . PERIPHERAL VASCULAR CATHETERIZATION  01/05/2016   Procedure: Lower Extremity Intervention;  Surgeon: Algernon Huxley, MD;  Location: [facility];  Service: Cardiovascular;;  . ureterolithiasis     calculus removed  . VAGINAL HYSTERECTOMY    . VISCERAL ANGIOGRAPHY N/A 05/10/2019   Procedure: VISCERAL ANGIOGRAPHY;  Surgeon: Algernon Huxley, MD;  Location: [facility];  Service: Cardiovascular;  Laterality: N/A;  . VULVA / PERINEUM BIOPSY  05/29/2015   Social History   Tobacco Use  . Smoking status: Never Smoker  . Smokeless tobacco: Never Used  Vaping Use  . Vaping Use: Never used  Substance Use Topics  . Alcohol use: No  . Drug use: No   Family History  Problem Relation Age of Onset  . Ovarian cancer Other   . Diabetes Sister   . Colon cancer Brother   . Diabetes Brother   . Atrial fibrillation Daughter   . Breast cancer Neg Hx    Allergies  Allergen Reactions  . Bacitracin-Neomycin-Polymyxin Rash  . Neomycin-Bacitracin Zn-Polymyx Swelling and Rash  . Latex Rash  . Lidocaine Rash  . Aricept [Donepezil Hcl] Diarrhea  . Benzalkonium Chloride Itching and Swelling  . Ibuprofen     Other reaction(s): Dizziness Heart fluttering. Tachycardia. Tachycardia.  . Valdecoxib Nausea And Vomiting  . Albuterol Rash  . Tape Rash    blisters  . Triamcinolone Rash       Medications: Outpatient Medications Prior to Visit  Medication Sig  . ADVAIR HFA 230-21 MCG/ACT inhaler TAKE 2 PUFFS INTO LUNGS TWICE A DAY  . albuterol (VENTOLIN HFA) 108 (90 Base) MCG/ACT inhaler INHALE 2 PUFFS INTO THE LUNGS EVERY 4 HOURS AS NEEDED FOR WHEEZING ORSHORTNESS OF BREATH  . Ascorbic Acid (VITAMIN C) 1000 MG tablet Take 1,000 mg by  mouth daily.   Marland Kitchen aspirin 325 MG tablet Take 325 mg by mouth daily. Reported on 01/05/2016  . atorvastatin (LIPITOR) 10 MG tablet Take 1 tablet (10 mg total) by mouth daily.  . cetirizine (ZYRTEC) 10 MG tablet TAKE ONE TABLET BY MOUTH EVERY DAY  . cholecalciferol (VITAMIN D3) 25 MCG (1000 UT) tablet Take 1,000 Units by mouth daily.  . clopidogrel (PLAVIX) 75 MG tablet TAKE 1 TABLET BY MOUTH DAILY  . ezetimibe (ZETIA) 10 MG tablet TAKE 1 TABLET BY MOUTH DAILY  . FLUoxetine (PROZAC) 20 MG capsule Take 1 capsule (20 mg total) by mouth daily.  . Fluticasone-Salmeterol (ADVAIR DISKUS) 250-50 MCG/DOSE AEPB Inhale 1 puff into the lungs 2  (two) times daily.  Marland Kitchen glucose blood test strip ACCU-CHEK AVIVA PLUS TEST STRP Use to check sugars 3 times daily.  . insulin glargine (LANTUS) 100 UNIT/ML injection Inject 0.05 mLs (5 Units total) into the skin at bedtime.  . insulin lispro (HUMALOG) 100 UNIT/ML KiwkPen Inject 10 Units into the skin 3 (three) times daily. 10 units before breakfast, 8 before lunch and 18 before supper  . isosorbide mononitrate (IMDUR) 30 MG 24 hr tablet Take 30 mg by mouth daily.  . lansoprazole (PREVACID) 30 MG capsule TAKE 1 CAPSULE BY MOUTH DAILY AT NOON  . losartan (COZAAR) 100 MG tablet TAKE ONE TABLET EVERY DAY  . Magnesium 400 MG CAPS Take by mouth daily.  . meloxicam (MOBIC) 7.5 MG tablet Take 1 tablet (7.5 mg total) by mouth daily as needed for pain.  . memantine (NAMENDA) 5 MG tablet TAKE ONE TABLET TWICE DAILY  . montelukast (SINGULAIR) 10 MG tablet TAKE ONE TABLET AT BEDTIME  . pantoprazole (PROTONIX) 40 MG tablet Take 40 mg by mouth daily.  . potassium chloride (KLOR-CON) 10 MEQ tablet TAKE 1 TABLET BY MOUTH TWICE DAILY  . predniSONE (DELTASONE) 20 MG tablet Take 1 tablet (20 mg total) by mouth daily with breakfast.  . QUEtiapine (SEROQUEL) 25 MG tablet Take 1 tablet by mouth at bedtime.  [name] ER 360 MG 24 hr capsule TAKE 1 CAPSULE EVERY DAY  . traZODone (DESYREL) 50 MG tablet TAKE 1 AND 1/2 TABLET AT BEDTIME AS NEEDED FOR SLEEP  . TUSSIN DM 100-10 MG/5ML liquid TAKE 1 TEASPOONFUL BY MOUTH EVERY 4 HOURS AS NEEDED FOR COUGH  . vitamin E 1000 UNIT capsule Take 1,000 Units by mouth daily.  . [DISCONTINUED] Respiratory Therapy Supplies (FLUTTER) DEVI Use as directed (Patient not taking: Reported on 10/16/2020)   No facility-administered medications prior to visit.    Review of Systems  Constitutional: Negative for appetite change, chills, fatigue and fever.  Respiratory: Negative for chest tightness and shortness of breath.   Cardiovascular: Negative for chest pain and palpitations.   Gastrointestinal: Negative for abdominal pain, nausea and vomiting.  Neurological: Positive for tingling and numbness (in fingers and hands). Negative for dizziness and weakness.       Objective    BP (!) 148/80 (BP Location: Right Arm, Patient Position: Sitting)   Pulse 98   Temp 98.4 F (36.9 C) (Temporal)   Resp 18   Wt 149 lb (67.6 kg)   BMI 26.39 kg/m     Physical Exam Constitutional:      General: She is not in acute distress.    Appearance: She is well-developed and well-nourished.  HENT:     Head: Normocephalic and atraumatic.     Right Ear: Hearing normal.     Left Ear: Hearing normal.  Nose: Nose normal.  Eyes:     General: Lids are normal. No scleral icterus.       Right eye: No discharge.        Left eye: No discharge.     Conjunctiva/sclera: Conjunctivae normal.  Cardiovascular:     Rate and Rhythm: Regular rhythm.     Heart sounds: Normal heart sounds.  Pulmonary:     Effort: Pulmonary effort is normal. No respiratory distress.  Abdominal:     General: Bowel sounds are normal.  Musculoskeletal:        General: Tenderness present.     Cervical back: Neck supple.     Comments: Pain in the left elbow and left shoulder to test ROM. No swelling.  Skin:    General: Skin is intact.     Findings: No lesion or rash.  Neurological:     Mental Status: She is alert and oriented to person, place, and time.  Psychiatric:        Mood and Affect: Mood and affect normal.        Speech: Speech normal.        Behavior: Behavior normal.        Thought Content: Thought content normal.      No results found for any visits on 10/16/20.  Assessment & Plan     1. Left shoulder pain, unspecified chronicity Fell at home September 2021. Did not seek medical attention. Having intermittent pain in the left shoulder to reach overhead or behind back. Will get x-ray evaluation and may need orthopedic referral. - DG Shoulder Left  2. Left elbow pain Associated with a  fall in September 2021. May use Voltaren Gel and will get x-ray evaluation. - DG Elbow Complete Left   No follow-ups on file.      I, Ziara Thelander, PA-C, have reviewed all documentation for this visit. The documentation on 10/16/20 for the exam, diagnosis, procedures, and orders are all accurate and complete.    Vernie Murders, PA-C  [employer] [phone] (phone) [phone] (fax)  [place]

## 2020-10-17 ENCOUNTER — Ambulatory Visit: Payer: Medicare Other | Admitting: Family Medicine

## 2020-10-20 ENCOUNTER — Other Ambulatory Visit: Payer: Self-pay | Admitting: Family Medicine

## 2020-10-20 ENCOUNTER — Encounter: Payer: Self-pay | Admitting: Family Medicine

## 2020-10-20 DIAGNOSIS — M25522 Pain in left elbow: Secondary | ICD-10-CM

## 2020-10-20 DIAGNOSIS — M25512 Pain in left shoulder: Secondary | ICD-10-CM

## 2020-10-24 ENCOUNTER — Encounter: Payer: Self-pay | Admitting: *Deleted

## 2020-10-24 ENCOUNTER — Telehealth: Payer: Self-pay

## 2020-10-24 NOTE — Telephone Encounter (Signed)
Letter printed and waiting for providers signature.

## 2020-10-24 NOTE — Telephone Encounter (Signed)
Copied from [place] [phone]. Topic: General - Other >> Oct 24, 2020  9:50 AM Tessa Lerner A wrote: Reason for CRM: Patient's son has made contact requesting a letter from PCP that explains his mother's inability to walk to her mailbox. Patient has received a letter from Dr. Rosanna Randy on this matter previously, and the postal service is now requiring another.  Patient's son would like to be contacted when the letter is completed or if there are any additional questions.  (Patient's son declined to make an appointment for patient to be seen in office at the time of call)

## 2020-10-27 DIAGNOSIS — M65312 Trigger thumb, left thumb: Secondary | ICD-10-CM | POA: Diagnosis not present

## 2020-10-27 DIAGNOSIS — M19012 Primary osteoarthritis, left shoulder: Secondary | ICD-10-CM | POA: Diagnosis not present

## 2020-10-27 NOTE — Telephone Encounter (Signed)
[name] was advised letter is ready.

## 2020-10-30 NOTE — Telephone Encounter (Signed)
Referral was completed and patient has already had visit with Dr. Rolm Gala.

## 2020-11-10 ENCOUNTER — Other Ambulatory Visit: Payer: Self-pay | Admitting: Family Medicine

## 2020-11-10 DIAGNOSIS — M19012 Primary osteoarthritis, left shoulder: Secondary | ICD-10-CM | POA: Diagnosis not present

## 2020-11-10 DIAGNOSIS — M5136 Other intervertebral disc degeneration, lumbar region: Secondary | ICD-10-CM | POA: Diagnosis not present

## 2020-11-10 DIAGNOSIS — I10 Essential (primary) hypertension: Secondary | ICD-10-CM

## 2020-11-10 DIAGNOSIS — M4696 Unspecified inflammatory spondylopathy, lumbar region: Secondary | ICD-10-CM | POA: Diagnosis not present

## 2020-11-10 NOTE — Telephone Encounter (Signed)
Requested medication (s) are due for refill today:   Yes for both  Requested medication (s) are on the active medication list:   Yes for both  Future visit scheduled:   No   Seen 3 wks ago   Last ordered: Tiadylt 10/14/2020 #30, 0 refill;              Losartan 100 mg 09/17/2019 #90, 3 refills  Clinic note:   Tiadylt courtesy supply given on 1/18.     Losartan is from 2020.  Wasn't sure whether these were to be refilled or not or if he wanted to see her first.     Requested Prescriptions  Pending Prescriptions Disp Refills   TIADYLT ER 360 MG 24 hr capsule [Pharmacy Med Name: TIADYLT ER 360 MG CAP] 30 capsule 0    Sig: TAKE 1 CAPSULE EVERY DAY      Cardiovascular:  Calcium Channel Blockers Failed - 11/10/2020  2:02 PM      Failed - Last BP in normal range    BP Readings from Last 1 Encounters:  10/16/20 (!) 148/80          Passed - Valid encounter within last 6 months    Recent Outpatient Visits           3 weeks ago Left shoulder pain, unspecified chronicity   [place], PA-C   3 months ago Pain in joint of left hand   [facility], Vickki Muff, PA-C   7 months ago Encounter for screening fecal occult blood testing   [facility] Jerrol Banana., MD   9 months ago [place] Jerrol Banana., MD   10 months ago Noninfected skin tear of left lower extremity, initial encounter   [facility] Terrilee Croak, Adriana M, PA-C                  losartan (COZAAR) 100 MG tablet [Pharmacy Med Name: LOSARTAN POTASSIUM 100 MG TAB] 90 tablet 3    Sig: TAKE ONE TABLET EVERY DAY      Cardiovascular:  Angiotensin Receptor Blockers Failed - 11/10/2020  2:02 PM      Failed - Cr in normal range and within 180 days    Creat  Date Value Ref Range Status  06/07/2017 1.19 (H) 0.60 - 0.88 mg/dL Final    Comment:    For patients >72 years of age, the reference  limit for Creatinine is approximately 13% higher for people identified as African-American. .    Creatinine, Ser  Date Value Ref Range Status  07/24/2020 1.16 (H) 0.57 - 1.00 mg/dL Final          Failed - Last BP in normal range    BP Readings from Last 1 Encounters:  10/16/20 (!) 148/80          Passed - K in normal range and within 180 days    Potassium  Date Value Ref Range Status  07/24/2020 4.4 3.5 - 5.2 mmol/L Final  10/13/2014 3.6 3.5 - 5.1 mmol/L Final          Passed - Patient is not pregnant      Passed - Valid encounter within last 6 months    Recent Outpatient Visits           3 weeks ago Left shoulder pain, unspecified chronicity   [place], Vickki Muff, PA-C   3 months  ago Pain in joint of left hand   [place], Vickki Muff, PA-C   7 months ago Encounter for screening fecal occult blood testing   [facility] Jerrol Banana., MD   9 months ago [place] Jerrol Banana., MD   10 months ago Noninfected skin tear of left lower extremity, initial encounter   [facility] [place] [place], [place]

## 2020-11-11 NOTE — Telephone Encounter (Signed)
[facility] is calling checking on the status of losartan refill. Pt will be out tomorrow

## 2020-11-12 ENCOUNTER — Encounter: Payer: Self-pay | Admitting: Family Medicine

## 2020-11-13 ENCOUNTER — Ambulatory Visit: Payer: Self-pay | Admitting: *Deleted

## 2020-11-13 ENCOUNTER — Telehealth: Payer: Self-pay

## 2020-11-13 NOTE — Telephone Encounter (Signed)
Patient has noticed blood mixed in with her stools for about a month, almost every stool. Was bright red but recently appears darker. Feeling weaker than normal and occasional dizziness during this period. Patient's daughter-in-law calling for appointment. Scheduled for 11/18/20 with pcp. Asked that she keeps a note on how much blood and the color it appears. Caller understood instructions.

## 2020-11-14 ENCOUNTER — Ambulatory Visit (INDEPENDENT_AMBULATORY_CARE_PROVIDER_SITE_OTHER): Payer: Medicare Other

## 2020-11-14 DIAGNOSIS — E1159 Type 2 diabetes mellitus with other circulatory complications: Secondary | ICD-10-CM

## 2020-11-14 NOTE — Chronic Care Management (AMB) (Signed)
Chronic Care Management   Follow Up Note   11/14/2020 Name: [name] MRN: [id number] DOB: 1929-07-04  Primary Care Provider: Jerrol Banana., MD Reason for referral : Chronic Care Management   [name] is a 87 y.o. year old female who is a primary care patient of Jerrol Banana., MD. She is currently enrolled in the Chronic Care Management Program.  Review of [name]'s status, including review of consultants reports, relevant labs and test results was conducted today. Collaboration with appropriate care team members was performed as part of the comprehensive evaluation and provision of chronic care management services.    SDOH (Social Determinants of Health) assessments performed: No     Outpatient Encounter Medications as of 11/14/2020  Medication Sig  . ADVAIR HFA 230-21 MCG/ACT inhaler TAKE 2 PUFFS INTO LUNGS TWICE A DAY  . albuterol (VENTOLIN HFA) 108 (90 Base) MCG/ACT inhaler INHALE 2 PUFFS INTO THE LUNGS EVERY 4 HOURS AS NEEDED FOR WHEEZING ORSHORTNESS OF BREATH  . Ascorbic Acid (VITAMIN C) 1000 MG tablet Take 1,000 mg by mouth daily.   Marland Kitchen aspirin 325 MG tablet Take 325 mg by mouth daily. Reported on 01/05/2016  . atorvastatin (LIPITOR) 10 MG tablet Take 1 tablet (10 mg total) by mouth daily.  . cetirizine (ZYRTEC) 10 MG tablet TAKE ONE TABLET BY MOUTH EVERY DAY  . cholecalciferol (VITAMIN D3) 25 MCG (1000 UT) tablet Take 1,000 Units by mouth daily.  . clopidogrel (PLAVIX) 75 MG tablet TAKE 1 TABLET BY MOUTH DAILY  . ezetimibe (ZETIA) 10 MG tablet TAKE 1 TABLET BY MOUTH DAILY  . FLUoxetine (PROZAC) 20 MG capsule Take 1 capsule (20 mg total) by mouth daily.  . Fluticasone-Salmeterol (ADVAIR DISKUS) 250-50 MCG/DOSE AEPB Inhale 1 puff into the lungs 2 (two) times daily.  Marland Kitchen glucose blood test strip ACCU-CHEK AVIVA PLUS TEST STRP Use to check sugars 3 times daily.  . insulin glargine (LANTUS) 100 UNIT/ML injection Inject 0.05 mLs (5 Units  total) into the skin at bedtime.  . insulin lispro (HUMALOG) 100 UNIT/ML KiwkPen Inject 10 Units into the skin 3 (three) times daily. 10 units before breakfast, 8 before lunch and 18 before supper  . isosorbide mononitrate (IMDUR) 30 MG 24 hr tablet Take 30 mg by mouth daily.  . lansoprazole (PREVACID) 30 MG capsule TAKE 1 CAPSULE BY MOUTH DAILY AT NOON  . losartan (COZAAR) 100 MG tablet TAKE ONE TABLET EVERY DAY  . Magnesium 400 MG CAPS Take by mouth daily.  . meloxicam (MOBIC) 7.5 MG tablet Take 1 tablet (7.5 mg total) by mouth daily as needed for pain.  . memantine (NAMENDA) 5 MG tablet TAKE ONE TABLET TWICE DAILY  . montelukast (SINGULAIR) 10 MG tablet TAKE ONE TABLET AT BEDTIME  . pantoprazole (PROTONIX) 40 MG tablet Take 40 mg by mouth daily.  . potassium chloride (KLOR-CON) 10 MEQ tablet TAKE 1 TABLET BY MOUTH TWICE DAILY  . predniSONE (DELTASONE) 20 MG tablet Take 1 tablet (20 mg total) by mouth daily with breakfast.  . QUEtiapine (SEROQUEL) 25 MG tablet Take 1 tablet by mouth at bedtime.  [name] ER 360 MG 24 hr capsule TAKE 1 CAPSULE EVERY DAY  . traZODone (DESYREL) 50 MG tablet TAKE 1 AND 1/2 TABLET AT BEDTIME AS NEEDED FOR SLEEP  . TUSSIN DM 100-10 MG/5ML liquid TAKE 1 TEASPOONFUL BY MOUTH EVERY 4 HOURS AS NEEDED FOR COUGH  . vitamin E 1000 UNIT capsule Take 1,000 Units by mouth daily.  No facility-administered encounter medications on file as of 11/14/2020.     Objective:  Patient Care Plan: Diabetes Type 2 (Adult)    Problem Identified: Disease Progression (Diabetes, Type 2)     Long-Range Goal: Disease Progression Prevented or Minimized   Start Date: 11/14/2020  Expected End Date: 03/14/2021  Priority: High  Note:   Objective:  Lab Results  Component Value Date   HGBA1C 7.9 (H) 04/05/2019 .   Lab Results  Component Value Date   CREATININE 1.02 (H) 11/18/2020   CREATININE 1.16 (H) 07/24/2020   CREATININE 1.12 (H) 03/30/2020   No results found for: EGFR  Case  Manager Clinical Goal(s):  Marland Kitchen Over the next 120 days, patient will demonstrate improved adherence to prescribed treatment plan for Diabetes self-management as evidenced by taking medications as prescribed, adhering to recommended ADA/carb modified diet and utilizing Dexcom/taking appropriate interventions to correct blood glucose levels when the device alarms.   Interventions:  . Collaboration with Jerrol Banana., MD regarding development and update of comprehensive plan of care as evidenced by provider attestation and co-signature . Inter-disciplinary care team collaboration (see longitudinal plan of care) . Discussed medications with daughter [name]. Encouraged to ensure patient takes medications as prescribed and notify the provider if she seems unable to tolerate the prescribed regimen. Encouraged to update the care management team with concerns regarding prescription cost. . Discussed [name]'s ability to respond/intervene when the Dexcom alarms. Tammy reports that she's able to independently take appropriate measures when the device alarms. She also has a talking glucometer d/t decreased vision. Reports a few alarms but states most of the recent readings have been within range. Reports good dietary intake. Marland Kitchen Discussed importance of completing recommended DM preventive care. Reports very good family support. Family ensures that foot care and eye exams are completed as recommended.  Patient Goals/Self-Care Activities Over the next 120 days, patient will:  - Self-administer medications as prescribed - Attend all scheduled provider appointments - Continue utilizing Dexcom and take appropriate interventions when the device alarms - Adhere to prescribed ADA/carb modified - Notify provider or care management team with questions and new concerns as needed   Follow Up Plan:  Will follow up next month      Patient Care Plan: Fall Risk (Adult)    Problem Identified: Fall Risk      Long-Range Goal: Maintain Function /Absence of Fall and Fall-Related Injury   Start Date: 11/14/2020  Expected End Date: 03/16/2021  Priority: High  Note:   Current Barriers:  . Fall Risk r/t Impaired Vision  Clinical Goal(s):  Marland Kitchen Over the next 120 days, patient will not experience falls or require emergent care d/t fall related injuries.  Interventions:  . Collaboration with Jerrol Banana., MD regarding development and update of comprehensive plan of care as evidenced by provider attestation and co-signature . Inter-disciplinary care team collaboration (see longitudinal plan of care) . Discussed medications and potential side effect such as lightheadedness, dizziness and frequent urination. Encouraged to notify provider if Mrs. Reas seems unable to tolerate the prescribed regimen. . Discussed safety and fall prevention measures. Discussed Mrs. Krul's ability to perform independently in the home. Tammy does not recall recent falls. Reports Mrs. Wissink still lives alone and able to perform ADL's independently. She uses a Programmer, multimedia as needed. Reports family is available to assist as needed.  We discussed concerns regarding vision impairment. Reports she has not had to use the Life Alert device. The family will consider  installing a camera/monitoring system if her ability to function declines.   Self-Care Deficits/Patient Goals:  Over the next 60 days, patient will: -Utilize assistive device appropriately with all ambulation -Change positions slowly and wear non skid footwear when ambulating -Ensure pathways are clear and well lit (Family will assist) -Adjust specified medications as advised by her provider -Contact provider or the care management team with questions or new concerns  Follow Up Plan Will follow up next month     PLAN A member of the care management team will follow up next month.    Cristy Friedlander [facility] Care Management [facility] [phone]

## 2020-11-17 NOTE — Patient Instructions (Signed)
  Thank you for allowing the Chronic Care Management team to participate in your care.   Goals Addressed            This Visit's Progress   . Chronic Disease Management        Current Barriers:  . Chronic Disease Management support and education needs related to Hypertension, Diabetes and COPD.   Case Manager Clinical Goal(s):  Marland Kitchen Over the next 90 days, patient will not be hospitalized for complications related to chronic illnesses. . Over the next 90 days, patient will take all medications as prescribed. (Caregiver to assist) . Over the next 90 days, patient will attend all scheduled medical appointments.(Caregiver to assist) . Over the next 90 days, patient will monitor blood glucose levels daily and record readings. (Caregiver to assist) . Over the next 90 days, patient will monitor blood pressure. (Caregiver to assist) . Over the next 90 days, patient will follow recommended home safety measures to prevent falls.  Interventions:    . Discussed recent blood glucose readings. Discussed recent readings with [name]. Reports that her DexCom readings have mostly been within range. [name] Hall has a back up glucometer with audio cues due to decreased vision. [name] confirmed that she continues to use the glucometer as needed to verify exact glucose readings. Reports good nutritional intake over the past few weeks. The family continues to assist with ensuring medications are administered as prescribed.  . Discussed home safety and long term plan for in-home assistance. [name] reports recent concerns with [name] Hall's safety in the home. Reports that she is still able to perform several tasks independently but has required more assistance over the past few weeks. Notes that she has experienced episodes of hallucinations and her vision continues to be a concern. We discussed options for Assisted Living and long term in-home assistance. The current plan is to keep her in  the home as long as possible, however placement or long term assistance may be required if she is unable to safely complete ADL's. [name] agreed to update the team with the family's plan for long term care.   Patient Self Care Activities:  . Performs ADL's independently . Requires assistance with IADLs and medication preparation.   Please see past updates related to this goal by clicking on the "Past Updates" button in the selected goal          [name] Hall's daughter [name] verbalized understanding of the information discussed during the telephonic outreach today. Declined need for mailed resources. A member of the care management team will follow-up next month.    [name] [facility] Care Management [facility] [phone]

## 2020-11-18 ENCOUNTER — Other Ambulatory Visit: Payer: Self-pay

## 2020-11-18 ENCOUNTER — Ambulatory Visit (INDEPENDENT_AMBULATORY_CARE_PROVIDER_SITE_OTHER): Payer: Medicare Other | Admitting: Family Medicine

## 2020-11-18 ENCOUNTER — Encounter: Payer: Self-pay | Admitting: Family Medicine

## 2020-11-18 VITALS — BP 148/79 | HR 92 | Temp 98.7°F | Resp 18 | Wt 148.0 lb

## 2020-11-18 DIAGNOSIS — L409 Psoriasis, unspecified: Secondary | ICD-10-CM | POA: Diagnosis not present

## 2020-11-18 DIAGNOSIS — C439 Malignant melanoma of skin, unspecified: Secondary | ICD-10-CM | POA: Diagnosis not present

## 2020-11-18 DIAGNOSIS — I25118 Atherosclerotic heart disease of native coronary artery with other forms of angina pectoris: Secondary | ICD-10-CM | POA: Diagnosis not present

## 2020-11-18 DIAGNOSIS — D649 Anemia, unspecified: Secondary | ICD-10-CM

## 2020-11-18 DIAGNOSIS — Z794 Long term (current) use of insulin: Secondary | ICD-10-CM

## 2020-11-18 DIAGNOSIS — R195 Other fecal abnormalities: Secondary | ICD-10-CM

## 2020-11-18 DIAGNOSIS — E1121 Type 2 diabetes mellitus with diabetic nephropathy: Secondary | ICD-10-CM | POA: Diagnosis not present

## 2020-11-18 DIAGNOSIS — F0280 Dementia in other diseases classified elsewhere without behavioral disturbance: Secondary | ICD-10-CM

## 2020-11-18 DIAGNOSIS — I480 Paroxysmal atrial fibrillation: Secondary | ICD-10-CM

## 2020-11-18 DIAGNOSIS — G3183 Dementia with Lewy bodies: Secondary | ICD-10-CM

## 2020-11-18 DIAGNOSIS — I130 Hypertensive heart and chronic kidney disease with heart failure and stage 1 through stage 4 chronic kidney disease, or unspecified chronic kidney disease: Secondary | ICD-10-CM

## 2020-11-18 DIAGNOSIS — I10 Essential (primary) hypertension: Secondary | ICD-10-CM | POA: Diagnosis not present

## 2020-11-18 DIAGNOSIS — E559 Vitamin D deficiency, unspecified: Secondary | ICD-10-CM

## 2020-11-18 NOTE — Progress Notes (Signed)
I,[name],acting as a scribe for Wilhemena Durie, MD.,have documented all relevant documentation on the behalf of Wilhemena Durie, MD,as directed by  Wilhemena Durie, MD while in the presence of Wilhemena Durie, MD.   Established patient visit   Patient: [name]   DOB: [birth year]   87 y.o. Female  MRN: [id number] Visit Date: 11/18/2020  Today's healthcare provider: Wilhemena Durie, MD   Chief Complaint  Patient presents with  . Blood In Stools   Subjective    HPI  Patient is here concerning her stools. Patient thought she saw blood in her stools. However, family members looked and did not see any.  Patient has significant visual problems and the family has looked  at her stool and has not seen any blood in the stool.  No other GI symptoms, no abdominal pain fever weight loss.  Her diabetes is followed by Dr. Mickie Kay from endocrinology and patient and family are aware of more frequent hypoglycemia which is being addressed by working on dosing of her medication.  Patient seems to be taking all her medications as prescribed but she has some mild cognitive issues and visual disturbance so I am a little concerned she is not taking everything as indicated.  Family is involved with the patient.        Medications: Outpatient Medications Prior to Visit  Medication Sig  . ADVAIR HFA 230-21 MCG/ACT inhaler TAKE 2 PUFFS INTO LUNGS TWICE A DAY  . albuterol (VENTOLIN HFA) 108 (90 Base) MCG/ACT inhaler INHALE 2 PUFFS INTO THE LUNGS EVERY 4 HOURS AS NEEDED FOR WHEEZING ORSHORTNESS OF BREATH  . Ascorbic Acid (VITAMIN C) 1000 MG tablet Take 1,000 mg by mouth daily.   Marland Kitchen aspirin 325 MG tablet Take 325 mg by mouth daily. Reported on 01/05/2016  . atorvastatin (LIPITOR) 10 MG tablet Take 1 tablet (10 mg total) by mouth daily.  . cetirizine (ZYRTEC) 10 MG tablet TAKE ONE TABLET BY MOUTH EVERY DAY  . cholecalciferol (VITAMIN D3) 25 MCG (1000 UT) tablet Take 1,000  Units by mouth daily.  . clopidogrel (PLAVIX) 75 MG tablet TAKE 1 TABLET BY MOUTH DAILY  . ezetimibe (ZETIA) 10 MG tablet TAKE 1 TABLET BY MOUTH DAILY  . FLUoxetine (PROZAC) 20 MG capsule Take 1 capsule (20 mg total) by mouth daily.  Marland Kitchen glucose blood test strip ACCU-CHEK AVIVA PLUS TEST STRP Use to check sugars 3 times daily.  . insulin glargine (LANTUS) 100 UNIT/ML injection Inject 0.05 mLs (5 Units total) into the skin at bedtime.  . insulin lispro (HUMALOG) 100 UNIT/ML KiwkPen Inject 10 Units into the skin 3 (three) times daily. 10 units before breakfast, 8 before lunch and 18 before supper  . isosorbide mononitrate (IMDUR) 30 MG 24 hr tablet Take 30 mg by mouth daily.  . lansoprazole (PREVACID) 30 MG capsule TAKE 1 CAPSULE BY MOUTH DAILY AT NOON  . losartan (COZAAR) 100 MG tablet TAKE ONE TABLET EVERY DAY  . Magnesium 400 MG CAPS Take by mouth daily.  . meloxicam (MOBIC) 7.5 MG tablet Take 1 tablet (7.5 mg total) by mouth daily as needed for pain.  . memantine (NAMENDA) 5 MG tablet TAKE ONE TABLET TWICE DAILY  . montelukast (SINGULAIR) 10 MG tablet TAKE ONE TABLET AT BEDTIME  . pantoprazole (PROTONIX) 40 MG tablet Take 40 mg by mouth daily.  . potassium chloride (KLOR-CON) 10 MEQ tablet TAKE 1 TABLET BY MOUTH TWICE DAILY  . predniSONE (DELTASONE) 20 MG  tablet Take 1 tablet (20 mg total) by mouth daily with breakfast.  . QUEtiapine (SEROQUEL) 25 MG tablet Take 1 tablet by mouth at bedtime.  [name] ER 360 MG 24 hr capsule TAKE 1 CAPSULE EVERY DAY  . traZODone (DESYREL) 50 MG tablet TAKE 1 AND 1/2 TABLET AT BEDTIME AS NEEDED FOR SLEEP  . TUSSIN DM 100-10 MG/5ML liquid TAKE 1 TEASPOONFUL BY MOUTH EVERY 4 HOURS AS NEEDED FOR COUGH  . vitamin E 1000 UNIT capsule Take 1,000 Units by mouth daily.  . Fluticasone-Salmeterol (ADVAIR DISKUS) 250-50 MCG/DOSE AEPB Inhale 1 puff into the lungs 2 (two) times daily.   No facility-administered medications prior to visit.    Review of Systems   Constitutional: Negative for appetite change, chills, fatigue and fever.  Respiratory: Negative for chest tightness and shortness of breath.   Cardiovascular: Negative for chest pain and palpitations.  Gastrointestinal: Positive for blood in stool. Negative for abdominal pain, nausea and vomiting.  Neurological: Negative for dizziness and weakness.        Objective    There were no vitals taken for this visit. BP Readings from Last 3 Encounters:  11/18/20 (!) 148/79  10/16/20 (!) 148/80  09/09/20 (!) 187/74   Wt Readings from Last 3 Encounters:  11/18/20 148 lb (67.1 kg)  10/16/20 149 lb (67.6 kg)  09/09/20 147 lb 6.4 oz (66.9 kg)     She is a cac Cachectic white female in no acute distress. Skin is very fair Conjunctiva pink, sclera anicteric Neurologic exam grossly nonfocal sitting in the chair. Soft carotid bruit present Heart regular rate and rhythm Lungs clear Abdomen soft and nontender Extremities 0 edema    No results found for any visits on 11/18/20.  Assessment & Plan     1. Anemia, unspecified type Iron deficiency anemia chronic disease and chronic kidney disease. Would not do GI work-up unless patient is very symptomatic. - CBC w/Diff/Platelet - Iron, TIBC and Ferritin Panel - Comprehensive Metabolic Panel (CMET)  2. Loose stools Probiotic daily - CBC w/Diff/Platelet - Iron, TIBC and Ferritin Panel - Comprehensive Metabolic Panel (CMET)  3. Heart & renal disease, hypertensive, with heart failure (HCC) Follow routine labs and avoid anti-inflammatory - CBC w/Diff/Platelet - Iron, TIBC and Ferritin Panel - Comprehensive Metabolic Panel (CMET)  4. Paroxysmal atrial fibrillation (HCC)  - CBC w/Diff/Platelet - Iron, TIBC and Ferritin Panel - Comprehensive Metabolic Panel (CMET)  5. Benign essential HTN Fairly good control. - CBC w/Diff/Platelet - Iron, TIBC and Ferritin Panel - Comprehensive Metabolic Panel (CMET)  6. Coronary artery  disease of native artery of native heart with stable angina pectoris ([place]) All risk factors treated  7. Type 2 diabetes mellitus with diabetic nephropathy, with long-term current use of insulin [facility]) Per endocrinology  8. Lewy body dementia without behavioral disturbance ([place]) Patient on Namenda and Seroquel  9. Psoriasis   10. Malignant melanoma of skin ([place])   11. Avitaminosis D    No follow-ups on file.      I, Wilhemena Durie, MD, have reviewed all documentation for this visit. The documentation on 11/22/20 for the exam, diagnosis, procedures, and orders are all accurate and complete.    Richard Cranford Mon, MD  [facility] [phone] (phone) [phone] (fax)  [place]

## 2020-11-19 LAB — COMPREHENSIVE METABOLIC PANEL
ALT: 35 IU/L — ABNORMAL HIGH (ref 0–32)
AST: 25 IU/L (ref 0–40)
Albumin/Globulin Ratio: 1.9 (ref 1.2–2.2)
Albumin: 4.5 g/dL (ref 3.6–4.6)
Alkaline Phosphatase: 98 IU/L (ref 44–121)
BUN/Creatinine Ratio: 19 (ref 12–28)
BUN: 19 mg/dL (ref 8–27)
Bilirubin Total: 0.3 mg/dL (ref 0.0–1.2)
CO2: 19 mmol/L — ABNORMAL LOW (ref 20–29)
Calcium: 9.1 mg/dL (ref 8.7–10.3)
Chloride: 98 mmol/L (ref 96–106)
Creatinine, Ser: 1.02 mg/dL — ABNORMAL HIGH (ref 0.57–1.00)
GFR calc Af Amer: 56 mL/min/{1.73_m2} — ABNORMAL LOW (ref >59)
GFR calc non Af Amer: 49 mL/min/{1.73_m2} — ABNORMAL LOW (ref >59)
Globulin, Total: 2.4 g/dL (ref 1.5–4.5)
Glucose: 193 mg/dL — ABNORMAL HIGH (ref 65–99)
Potassium: 5 mmol/L (ref 3.5–5.2)
Sodium: 137 mmol/L (ref 134–144)
Total Protein: 6.9 g/dL (ref 6.0–8.5)

## 2020-11-19 LAB — IRON,TIBC AND FERRITIN PANEL
Ferritin: 46 ng/mL (ref 15–150)
Iron Saturation: 7 % — CL (ref 15–55)
Iron: 29 ug/dL (ref 27–139)
Total Iron Binding Capacity: 405 ug/dL (ref 250–450)
UIBC: 376 ug/dL — ABNORMAL HIGH (ref 118–369)

## 2020-11-19 LAB — CBC WITH DIFFERENTIAL/PLATELET
Basophils Absolute: 0.1 10*3/uL (ref 0.0–0.2)
Basos: 1 %
EOS (ABSOLUTE): 0.1 10*3/uL (ref 0.0–0.4)
Eos: 1 %
Hematocrit: 33.6 % — ABNORMAL LOW (ref 34.0–46.6)
Hemoglobin: 10.8 g/dL — ABNORMAL LOW (ref 11.1–15.9)
Immature Grans (Abs): 0 10*3/uL (ref 0.0–0.1)
Immature Granulocytes: 0 %
Lymphocytes Absolute: 2.2 10*3/uL (ref 0.7–3.1)
Lymphs: 23 %
MCH: 25.2 pg — ABNORMAL LOW (ref 26.6–33.0)
MCHC: 32.1 g/dL (ref 31.5–35.7)
MCV: 79 fL (ref 79–97)
Monocytes Absolute: 1 10*3/uL — ABNORMAL HIGH (ref 0.1–0.9)
Monocytes: 10 %
Neutrophils Absolute: 6.3 10*3/uL (ref 1.4–7.0)
Neutrophils: 65 %
Platelets: 280 10*3/uL (ref 150–450)
RBC: 4.28 x10E6/uL (ref 3.77–5.28)
RDW: 15.8 % — ABNORMAL HIGH (ref 11.7–15.4)
WBC: 9.7 10*3/uL (ref 3.4–10.8)

## 2020-11-30 ENCOUNTER — Encounter: Payer: Self-pay | Admitting: Family Medicine

## 2020-12-01 DIAGNOSIS — S51812A Laceration without foreign body of left forearm, initial encounter: Secondary | ICD-10-CM | POA: Diagnosis not present

## 2020-12-02 DIAGNOSIS — Z794 Long term (current) use of insulin: Secondary | ICD-10-CM | POA: Diagnosis not present

## 2020-12-02 DIAGNOSIS — E1122 Type 2 diabetes mellitus with diabetic chronic kidney disease: Secondary | ICD-10-CM | POA: Diagnosis not present

## 2020-12-02 DIAGNOSIS — S51812A Laceration without foreign body of left forearm, initial encounter: Secondary | ICD-10-CM | POA: Diagnosis not present

## 2020-12-02 DIAGNOSIS — N1832 Chronic kidney disease, stage 3b: Secondary | ICD-10-CM | POA: Diagnosis not present

## 2020-12-03 ENCOUNTER — Other Ambulatory Visit: Payer: Self-pay | Admitting: Family Medicine

## 2020-12-03 DIAGNOSIS — J3089 Other allergic rhinitis: Secondary | ICD-10-CM

## 2020-12-03 DIAGNOSIS — S51812D Laceration without foreign body of left forearm, subsequent encounter: Secondary | ICD-10-CM | POA: Diagnosis not present

## 2020-12-03 NOTE — Telephone Encounter (Signed)
Requested Prescriptions  Pending Prescriptions Disp Refills  . lansoprazole (PREVACID) 30 MG capsule [Pharmacy Med Name: LANSOPRAZOLE 30 MG CAP] 90 capsule 1    Sig: TAKE 1 CAPSULE BY MOUTH ONCE DAILY AT [place]     Gastroenterology: Proton Pump Inhibitors Passed - 12/03/2020 10:29 AM      Passed - Valid encounter within last 12 months    Recent Outpatient Visits          2 weeks ago Anemia, unspecified type   [facility] Jerrol Banana., MD   1 month ago Left shoulder pain, unspecified chronicity   [place], PA-C   4 months ago Pain in joint of left hand   [place], PA-C   8 months ago Encounter for screening fecal occult blood testing   [facility] Jerrol Banana., MD   10 months ago [place] Jerrol Banana., MD      Future Appointments            In 1 month Jerrol Banana., MD [facility]           . montelukast (SINGULAIR) 10 MG tablet [Pharmacy Med Name: MONTELUKAST SODIUM 10 MG TAB] 90 tablet 1    Sig: TAKE ONE TABLET AT BEDTIME     Pulmonology:  Leukotriene Inhibitors Passed - 12/03/2020 10:29 AM      Passed - Valid encounter within last 12 months    Recent Outpatient Visits          2 weeks ago Anemia, unspecified type   [facility] Jerrol Banana., MD   1 month ago Left shoulder pain, unspecified chronicity   [place], PA-C   4 months ago Pain in joint of left hand   [place], PA-C   8 months ago Encounter for screening fecal occult blood testing   [facility] Jerrol Banana., MD   10 months ago [place] Jerrol Banana., MD      Future Appointments            In 1 month Jerrol Banana., MD [facility], [facility]           . memantine (NAMENDA) 5 MG tablet [Pharmacy Med Name: MEMANTINE HCL 5 MG TAB] 60 tablet 3    Sig: TAKE ONE TABLET TWICE DAILY     Neurology:  Alzheimer's Agents Passed - 12/03/2020 10:29 AM      Passed - Valid encounter within last 6 months    Recent Outpatient Visits          2 weeks ago Anemia, unspecified type   [facility] Jerrol Banana., MD   1 month ago Left shoulder pain, unspecified chronicity   [place], PA-C   4 months ago Pain in joint of left hand   [place], PA-C   8 months ago Encounter for screening fecal occult blood testing   [facility] Jerrol Banana., MD   10 months ago [place] Jerrol Banana., MD      Future Appointments            In 1 month Jerrol Banana., MD [facility], [facility]

## 2020-12-04 DIAGNOSIS — M79602 Pain in left arm: Secondary | ICD-10-CM | POA: Diagnosis not present

## 2020-12-04 DIAGNOSIS — M79622 Pain in left upper arm: Secondary | ICD-10-CM | POA: Diagnosis not present

## 2020-12-04 DIAGNOSIS — S51812D Laceration without foreign body of left forearm, subsequent encounter: Secondary | ICD-10-CM | POA: Diagnosis not present

## 2020-12-04 DIAGNOSIS — I708 Atherosclerosis of other arteries: Secondary | ICD-10-CM | POA: Diagnosis not present

## 2020-12-04 DIAGNOSIS — M79632 Pain in left forearm: Secondary | ICD-10-CM | POA: Diagnosis not present

## 2020-12-04 DIAGNOSIS — S4992XD Unspecified injury of left shoulder and upper arm, subsequent encounter: Secondary | ICD-10-CM | POA: Diagnosis not present

## 2020-12-04 NOTE — Patient Instructions (Signed)
Thank you for allowing the Chronic Care Management team to participate in your care.    Patient Care Plan: Diabetes Type 2 (Adult)    Problem Identified: Disease Progression (Diabetes, Type 2)     Long-Range Goal: Disease Progression Prevented or Minimized   Start Date: 11/14/2020  Expected End Date: 03/14/2021  Priority: High  Note:   Objective:  Lab Results  Component Value Date   HGBA1C 7.9 (H) 04/05/2019 .   Lab Results  Component Value Date   CREATININE 1.02 (H) 11/18/2020   CREATININE 1.16 (H) 07/24/2020   CREATININE 1.12 (H) 03/30/2020   No results found for: EGFR  Case Manager Clinical Goal(s):  Marland Kitchen Over the next 120 days, patient will demonstrate improved adherence to prescribed treatment plan for Diabetes self-management as evidenced by taking medications as prescribed, adhering to recommended ADA/carb modified diet and utilizing Dexcom/taking appropriate interventions to correct blood glucose levels when the device alarms.   Interventions:  . Collaboration with Jerrol Banana., MD regarding development and update of comprehensive plan of care as evidenced by provider attestation and co-signature . Inter-disciplinary care team collaboration (see longitudinal plan of care) . Discussed medications with daughter [name]. Encouraged to ensure patient takes medications as prescribed and notify the provider if she seems unable to tolerate the prescribed regimen. Encouraged to update the care management team with concerns regarding prescription cost. . Discussed Mrs. Issa's ability to respond/intervene when the Dexcom alarms. Tammy reports that she's able to independently take appropriate measures when the device alarms. She also has a talking glucometer d/t decreased vision. Reports a few alarms but states most of the recent readings have been within range. Reports good dietary intake. Marland Kitchen Discussed importance of completing recommended DM preventive care. Reports very good  family support. Family ensures that foot care and eye exams are completed as recommended.  Patient Goals/Self-Care Activities Over the next 120 days, patient will:  - Self-administer medications as prescribed - Attend all scheduled provider appointments - Continue utilizing Dexcom and take appropriate interventions when the device alarms - Adhere to prescribed ADA/carb modified - Notify provider or care management team with questions and new concerns as needed   Follow Up Plan:  Will follow up next month      Patient Care Plan: Fall Risk (Adult)    Problem Identified: Fall Risk     Long-Range Goal: Maintain Function /Absence of Fall and Fall-Related Injury   Start Date: 11/14/2020  Expected End Date: 03/16/2021  Priority: High  Note:   Current Barriers:  . Fall Risk r/t Impaired Vision  Clinical Goal(s):  Marland Kitchen Over the next 120 days, patient will not experience falls or require emergent care d/t fall related injuries.  Interventions:  . Collaboration with Jerrol Banana., MD regarding development and update of comprehensive plan of care as evidenced by provider attestation and co-signature . Inter-disciplinary care team collaboration (see longitudinal plan of care) . Discussed medications and potential side effect such as lightheadedness, dizziness and frequent urination. Encouraged to notify provider if Mrs. Caputo seems unable to tolerate the prescribed regimen. . Discussed safety and fall prevention measures. Discussed Mrs. Hink's ability to perform independently in the home. Tammy does not recall recent falls. Reports Mrs. Osmundson still lives alone and able to perform ADL's independently. She uses a Programmer, multimedia as needed. Reports family is available to assist as needed.  We discussed concerns regarding vision impairment. Reports she has not had to use the Life Alert device. The family will  consider installing a camera/monitoring system if her ability to function  declines.   Self-Care Deficits/Patient Goals:  Over the next 60 days, patient will: -Utilize assistive device appropriately with all ambulation -Change positions slowly and wear non skid footwear when ambulating -Ensure pathways are clear and well lit (Family will assist) -Adjust specified medications as advised by her provider -Contact provider or the care management team with questions or new concerns  Follow Up Plan Will follow up next month           Mrs. Pang's daughter Tammy verbalized understanding of the information discussed during the telephonic outreach today. Declined need for mailed/printed instructions. A member of the care management team will follow up next month.    Cristy [facility]lander [facility] Care Management [facility] [phone]

## 2020-12-06 DIAGNOSIS — S51812D Laceration without foreign body of left forearm, subsequent encounter: Secondary | ICD-10-CM | POA: Diagnosis not present

## 2020-12-08 ENCOUNTER — Other Ambulatory Visit: Payer: Self-pay | Admitting: Internal Medicine

## 2020-12-08 DIAGNOSIS — Z794 Long term (current) use of insulin: Secondary | ICD-10-CM | POA: Diagnosis not present

## 2020-12-08 DIAGNOSIS — E1122 Type 2 diabetes mellitus with diabetic chronic kidney disease: Secondary | ICD-10-CM | POA: Diagnosis not present

## 2020-12-08 DIAGNOSIS — R059 Cough, unspecified: Secondary | ICD-10-CM | POA: Diagnosis not present

## 2020-12-08 DIAGNOSIS — N1832 Chronic kidney disease, stage 3b: Secondary | ICD-10-CM | POA: Diagnosis not present

## 2020-12-08 DIAGNOSIS — J449 Chronic obstructive pulmonary disease, unspecified: Secondary | ICD-10-CM

## 2020-12-08 DIAGNOSIS — Z20822 Contact with and (suspected) exposure to covid-19: Secondary | ICD-10-CM | POA: Diagnosis not present

## 2020-12-08 DIAGNOSIS — J40 Bronchitis, not specified as acute or chronic: Secondary | ICD-10-CM | POA: Diagnosis not present

## 2020-12-08 DIAGNOSIS — Z03818 Encounter for observation for suspected exposure to other biological agents ruled out: Secondary | ICD-10-CM | POA: Diagnosis not present

## 2020-12-09 DIAGNOSIS — E1165 Type 2 diabetes mellitus with hyperglycemia: Secondary | ICD-10-CM | POA: Diagnosis not present

## 2020-12-09 DIAGNOSIS — Z794 Long term (current) use of insulin: Secondary | ICD-10-CM | POA: Diagnosis not present

## 2020-12-15 ENCOUNTER — Telehealth: Payer: Self-pay

## 2020-12-16 DIAGNOSIS — R809 Proteinuria, unspecified: Secondary | ICD-10-CM | POA: Diagnosis not present

## 2020-12-16 DIAGNOSIS — R0982 Postnasal drip: Secondary | ICD-10-CM | POA: Diagnosis not present

## 2020-12-16 DIAGNOSIS — N1832 Chronic kidney disease, stage 3b: Secondary | ICD-10-CM | POA: Diagnosis not present

## 2020-12-16 DIAGNOSIS — Z794 Long term (current) use of insulin: Secondary | ICD-10-CM | POA: Diagnosis not present

## 2020-12-16 DIAGNOSIS — E1142 Type 2 diabetes mellitus with diabetic polyneuropathy: Secondary | ICD-10-CM | POA: Diagnosis not present

## 2020-12-16 DIAGNOSIS — S51812D Laceration without foreign body of left forearm, subsequent encounter: Secondary | ICD-10-CM | POA: Diagnosis not present

## 2020-12-16 DIAGNOSIS — E1129 Type 2 diabetes mellitus with other diabetic kidney complication: Secondary | ICD-10-CM | POA: Diagnosis not present

## 2020-12-16 DIAGNOSIS — E1122 Type 2 diabetes mellitus with diabetic chronic kidney disease: Secondary | ICD-10-CM | POA: Diagnosis not present

## 2020-12-16 DIAGNOSIS — Z5189 Encounter for other specified aftercare: Secondary | ICD-10-CM | POA: Diagnosis not present

## 2020-12-16 DIAGNOSIS — I10 Essential (primary) hypertension: Secondary | ICD-10-CM | POA: Diagnosis not present

## 2020-12-16 DIAGNOSIS — E1159 Type 2 diabetes mellitus with other circulatory complications: Secondary | ICD-10-CM | POA: Diagnosis not present

## 2021-01-02 ENCOUNTER — Other Ambulatory Visit: Payer: Self-pay | Admitting: Adult Health

## 2021-01-05 ENCOUNTER — Other Ambulatory Visit: Payer: Self-pay | Admitting: Internal Medicine

## 2021-01-05 DIAGNOSIS — J449 Chronic obstructive pulmonary disease, unspecified: Secondary | ICD-10-CM

## 2021-01-06 ENCOUNTER — Telehealth: Payer: Self-pay

## 2021-01-13 ENCOUNTER — Telehealth (INDEPENDENT_AMBULATORY_CARE_PROVIDER_SITE_OTHER): Payer: Self-pay | Admitting: Vascular Surgery

## 2021-01-13 NOTE — Telephone Encounter (Signed)
Patients daughter called in stating her mother (patient) is complaining upper right leg pain in the crease area, patient believes to be blood clots, per daughter had stints put in both legs.   Patients next appointment is 6/28 for carotid, mesenteric and follow up with [place].  Please advise patients

## 2021-01-13 NOTE — Telephone Encounter (Signed)
ABI and r DVT study...let daughter know that based on the location this is usually not vascular but musculoskeletal.  We can certainly bring her in to look but if everything is negative, she will need to discuss it with her PCP

## 2021-01-20 ENCOUNTER — Ambulatory Visit (INDEPENDENT_AMBULATORY_CARE_PROVIDER_SITE_OTHER): Payer: Medicare Other

## 2021-01-20 DIAGNOSIS — Z794 Long term (current) use of insulin: Secondary | ICD-10-CM

## 2021-01-20 DIAGNOSIS — M79604 Pain in right leg: Secondary | ICD-10-CM

## 2021-01-20 DIAGNOSIS — M79602 Pain in left arm: Secondary | ICD-10-CM

## 2021-01-20 DIAGNOSIS — E1121 Type 2 diabetes mellitus with diabetic nephropathy: Secondary | ICD-10-CM

## 2021-01-20 NOTE — Chronic Care Management (AMB) (Signed)
Chronic Care Management   Follow Up Note   01/20/2021 Name: [name] MRN: [id number] DOB: 1929/05/06  Primary Care Provider: Jerrol Banana., MD Reason for referral : Chronic Care Management   [name] is a 87 y.o. year old female who is a primary care patient of Tammy Banana., MD.   Review of [name]'s status, including review of consultants reports, relevant labs and test results was conducted today. Collaboration with appropriate care team members was performed as part of the comprehensive evaluation and provision of chronic care management services.    SDOH (Social Determinants of Health) assessments performed: No     Outpatient Encounter Medications as of 01/20/2021  Medication Sig  . ADVAIR HFA 230-21 MCG/ACT inhaler TAKE 2 PUFFS INTO LUNGS TWICE A DAY  . albuterol (VENTOLIN HFA) 108 (90 Base) MCG/ACT inhaler INHALE 2 PUFFS INTO THE LUNGS EVERY 4 HOURS AS NEEDED FOR WHEEZING ORSHORTNESS OF BREATH  . Ascorbic Acid (VITAMIN C) 1000 MG tablet Take 1,000 mg by mouth daily.   Marland Kitchen aspirin 325 MG tablet Take 325 mg by mouth daily. Reported on 01/05/2016  . atorvastatin (LIPITOR) 10 MG tablet Take 1 tablet (10 mg total) by mouth daily.  . cetirizine (ZYRTEC) 10 MG tablet TAKE ONE TABLET BY MOUTH EVERY DAY  . cholecalciferol (VITAMIN D3) 25 MCG (1000 UT) tablet Take 1,000 Units by mouth daily.  . clopidogrel (PLAVIX) 75 MG tablet TAKE 1 TABLET BY MOUTH DAILY  . ezetimibe (ZETIA) 10 MG tablet TAKE 1 TABLET BY MOUTH DAILY  . FLUoxetine (PROZAC) 20 MG capsule TAKE 1 CAPSULE EVERY DAY  . Fluticasone-Salmeterol (ADVAIR DISKUS) 250-50 MCG/DOSE AEPB Inhale 1 puff into the lungs 2 (two) times daily.  Marland Kitchen glucose blood test strip ACCU-CHEK AVIVA PLUS TEST STRP Use to check sugars 3 times daily.  . insulin glargine (LANTUS) 100 UNIT/ML injection Inject 0.05 mLs (5 Units total) into the skin at bedtime.  . insulin lispro (HUMALOG) 100 UNIT/ML KiwkPen  Inject 10 Units into the skin 3 (three) times daily. 10 units before breakfast, 8 before lunch and 18 before supper  . isosorbide mononitrate (IMDUR) 30 MG 24 hr tablet Take 30 mg by mouth daily.  . lansoprazole (PREVACID) 30 MG capsule TAKE 1 CAPSULE BY MOUTH ONCE DAILY AT NOON  . losartan (COZAAR) 100 MG tablet TAKE ONE TABLET EVERY DAY  . Magnesium 400 MG CAPS Take by mouth daily.  . meloxicam (MOBIC) 7.5 MG tablet Take 1 tablet (7.5 mg total) by mouth daily as needed for pain.  . memantine (NAMENDA) 5 MG tablet TAKE ONE TABLET TWICE DAILY  . montelukast (SINGULAIR) 10 MG tablet TAKE ONE TABLET AT BEDTIME  . pantoprazole (PROTONIX) 40 MG tablet Take 40 mg by mouth daily.  . potassium chloride (KLOR-CON) 10 MEQ tablet TAKE 1 TABLET BY MOUTH TWICE DAILY  . predniSONE (DELTASONE) 20 MG tablet Take 1 tablet (20 mg total) by mouth daily with breakfast.  . QUEtiapine (SEROQUEL) 25 MG tablet Take 1 tablet by mouth at bedtime.  [name] ER 360 MG 24 hr capsule TAKE 1 CAPSULE EVERY DAY  . traZODone (DESYREL) 50 MG tablet TAKE 1 AND 1/2 TABLET AT BEDTIME AS NEEDED FOR SLEEP  . TUSSIN DM 100-10 MG/5ML liquid TAKE 1 TEASPOONFUL BY MOUTH EVERY 4 HOURS AS NEEDED FOR COUGH  . vitamin E 1000 UNIT capsule Take 1,000 Units by mouth daily.   No facility-administered encounter medications on file as of 01/20/2021.  Objective:  Patient Care Plan: Diabetes Type 2 (Adult)    Problem Identified: Disease Progression (Diabetes, Type 2)     Long-Range Goal: Disease Progression Prevented or Minimized   Start Date: 11/14/2020  Expected End Date: 03/14/2021  Priority: High  Note:   Current Barriers:  . Chronic disease management support and educational needs related to Diabetes self-management   Case Manager Clinical Goal(s):  Marland Kitchen Over the next 120 days, patient will demonstrate improved adherence to prescribed treatment plan for Diabetes self-management as evidenced by taking medications as prescribed,  adhering to recommended ADA/carb modified diet and utilizing Dexcom/taking appropriate interventions to correct blood glucose levels when the device alarms.   Interventions:  . Collaboration with Tammy Banana., MD regarding development and update of comprehensive plan of care as evidenced by provider attestation and co-signature . Inter-disciplinary care team collaboration (see longitudinal plan of care) . Reviewed medications. Per daughter [name], she is attempting to take medications as prescribed. Notes that she now tends to unintentionally drop medications d/t decreased vision. The family continues to prepare her weekly medications and makes note of potentially missed doses. Tammy agreed to update the team if this continues to be a concern and assistance with medication management is needed.  . Discussed blood glucose readings. Tammy reports that [name] continues to manage her Dexcom independently. Reports most readings have been within range with very few alarms over the past few weeks. Reports good nutritional intake and compliance with diet.   Patient Goals/Self-Care Activities - Self-administer medications as prescribed - Attend all scheduled provider appointments - Continue utilizing Dexcom and take appropriate interventions when the device alarms - Adhere to prescribed ADA/carb modified - Notify provider or care management team with questions and new concerns as needed   Follow Up Plan:  Will follow up next month      Patient Care Plan: Fall Risk (Adult)    Problem Identified: Fall Risk     Long-Range Goal: Maintain Function /Absence of Fall and Fall-Related Injury   Start Date: 11/14/2020  Expected End Date: 03/16/2021  Priority: High  Note:   Current Barriers:  . Fall Risk r/t Impaired Vision  Clinical Goal(s):  Marland Kitchen Over the next 120 days, patient will not experience falls or require emergent care d/t fall related injuries.  Interventions:   . Collaboration with Tammy Banana., MD regarding development and update of comprehensive plan of care as evidenced by provider attestation and co-signature . Inter-disciplinary care team collaboration (see longitudinal plan of care) . Reviewed safety and fall prevention measures. In addition to visual impairment, she is at increased risk for falls d/t acute pain in right leg and left arm. Tammy reports [name] recently complained of pain in her right leg, behind her knee. Denies increased swelling or discoloration to the area. Also notes that she has been using a heating pad to her left upper arm d/t pain between her shoulder and elbow. Reports [name] continues to function independently and use assistive devices when ambulating. Denies falls within the last week. Confirmed discussing concerns regarding right leg pain with the Vascular Team d/t her history of DVT. She can be evaluated on 02/20/21. Reports being advised to contact her Primary Care Provider if evaluation is needed prior to that time. She is currently scheduled for a clinic visit on 01/26/21. Tammy declines need for an urgent visit. She requested to complete ultrasounds/imaging prior to the clinic visit if indicated. Will forward request.  . Reviewed medications  and potential side effects such as lightheadedness, dizziness and frequent urination. Encouraged to notify a provider if [name] seems unable to tolerate the prescribed regimen.    Self-Care Deficits/Patient Goals:  -Utilize assistive device appropriately with all ambulation -Change positions slowly and wear non skid footwear when ambulating -Ensure pathways are clear and well lit (Family will assist) -Adjust specified medications as advised by her provider -Follow up with PCP as scheduled on 01/26/21 -Contact provider or the care management team with questions or new concerns   Follow Up Plan Will follow up next month          PLAN Will  update [name]'s caregiver/daughter Tammy if imaging/ultrasound is needed prior to the next clinic visit. Will complete care management follow-up next month.     [facility]risty [facility] Care Management [facility] [phone]

## 2021-01-22 NOTE — Patient Instructions (Signed)
Thank you for allowing the Chronic Care Management team to participate in your care.    Goals Addressed: Patient Care Plan: Diabetes Type 2 (Adult)    Problem Identified: Disease Progression (Diabetes, Type 2)     Long-Range Goal: Disease Progression Prevented or Minimized   Start Date: 11/14/2020  Expected End Date: 03/14/2021  Priority: High  Note:   Current Barriers:  . Chronic disease management support and educational needs related to Diabetes self-management   Case Manager Clinical Goal(s):  Marland Kitchen Over the next 120 days, patient will demonstrate improved adherence to prescribed treatment plan for Diabetes self-management as evidenced by taking medications as prescribed, adhering to recommended ADA/carb modified diet and utilizing Dexcom/taking appropriate interventions to correct blood glucose levels when the device alarms.   Interventions:  . Collaboration with Jerrol Banana., MD regarding development and update of comprehensive plan of care as evidenced by provider attestation and co-signature . Inter-disciplinary care team collaboration (see longitudinal plan of care) . Reviewed medications. Per daughter [name], she is attempting to take medications as prescribed. Notes that she now tends to unintentionally drop medications d/t decreased vision. The family continues to prepare her weekly medications and makes note of potentially missed doses. Tammy agreed to update the team if this continues to be a concern and assistance with medication management is needed.  . Discussed blood glucose readings. Tammy reports that Mrs. Mothershead continues to manage her Dexcom independently. Reports most readings have been within range with very few alarms over the past few weeks. Reports good nutritional intake and compliance with diet.   Patient Goals/Self-Care Activities - Self-administer medications as prescribed - Attend all scheduled provider appointments - Continue utilizing Dexcom and  take appropriate interventions when the device alarms - Adhere to prescribed ADA/carb modified - Notify provider or care management team with questions and new concerns as needed   Follow Up Plan:  Will follow up next month      Patient Care Plan: Fall Risk (Adult)    Problem Identified: Fall Risk     Long-Range Goal: Maintain Function /Absence of Fall and Fall-Related Injury   Start Date: 11/14/2020  Expected End Date: 03/16/2021  Priority: High  Note:   Current Barriers:  . Fall Risk r/t Impaired Vision  Clinical Goal(s):  Marland Kitchen Over the next 120 days, patient will not experience falls or require emergent care d/t fall related injuries.  Interventions:  . Collaboration with Jerrol Banana., MD regarding development and update of comprehensive plan of care as evidenced by provider attestation and co-signature . Inter-disciplinary care team collaboration (see longitudinal plan of care) . Reviewed safety and fall prevention measures. In addition to visual impairment, she is at increased risk for falls d/t acute pain in right leg and left arm. Tammy reports Mrs. Geralds recently complained of pain in her right leg, behind her knee. Denies increased swelling or discoloration to the area. Also notes that she has been using a heating pad to her left upper arm d/t pain between her shoulder and elbow. Reports Mrs. Qadir continues to function independently and use assistive devices when ambulating. Denies falls within the last week. Confirmed discussing concerns regarding right leg pain with the Vascular Team d/t her history of DVT. She can be evaluated on 02/20/21. Reports being advised to contact her Primary Care Provider if evaluation is needed prior to that time. She is currently scheduled for a clinic visit on 01/26/21. Tammy declines need for an urgent visit. She requested to  complete ultrasounds/imaging prior to the clinic visit if indicated. Will forward request.  . Reviewed  medications and potential side effects such as lightheadedness, dizziness and frequent urination. Encouraged to notify a provider if Mrs. Buttram seems unable to tolerate the prescribed regimen.    Self-Care Deficits/Patient Goals:  -Utilize assistive device appropriately with all ambulation -Change positions slowly and wear non skid footwear when ambulating -Ensure pathways are clear and well lit (Family will assist) -Adjust specified medications as advised by her provider -Follow up with PCP as scheduled on 01/26/21 -Contact provider or the care management team with questions or new concerns   Follow Up Plan Will follow up next month          Mrs. Lowenstein's daughter/caregiver Tammy verbalized understanding of the information discussed during the telephonic outreach today. Declined need for mailed/printed instructions. A member of the care management team will follow up next month.    [facility] [phone]

## 2021-01-26 ENCOUNTER — Ambulatory Visit (INDEPENDENT_AMBULATORY_CARE_PROVIDER_SITE_OTHER): Payer: Medicare Other | Admitting: Family Medicine

## 2021-01-26 ENCOUNTER — Encounter: Payer: Self-pay | Admitting: Family Medicine

## 2021-01-26 ENCOUNTER — Other Ambulatory Visit: Payer: Self-pay

## 2021-01-26 VITALS — BP 165/74 | HR 81 | Temp 98.0°F | Resp 18 | Ht 62.0 in | Wt 147.0 lb

## 2021-01-26 DIAGNOSIS — M79605 Pain in left leg: Secondary | ICD-10-CM

## 2021-01-26 DIAGNOSIS — I25118 Atherosclerotic heart disease of native coronary artery with other forms of angina pectoris: Secondary | ICD-10-CM | POA: Diagnosis not present

## 2021-01-26 DIAGNOSIS — F0280 Dementia in other diseases classified elsewhere without behavioral disturbance: Secondary | ICD-10-CM

## 2021-01-26 DIAGNOSIS — I10 Essential (primary) hypertension: Secondary | ICD-10-CM | POA: Diagnosis not present

## 2021-01-26 DIAGNOSIS — H6123 Impacted cerumen, bilateral: Secondary | ICD-10-CM | POA: Diagnosis not present

## 2021-01-26 DIAGNOSIS — E7800 Pure hypercholesterolemia, unspecified: Secondary | ICD-10-CM | POA: Diagnosis not present

## 2021-01-26 DIAGNOSIS — M79604 Pain in right leg: Secondary | ICD-10-CM

## 2021-01-26 DIAGNOSIS — E1159 Type 2 diabetes mellitus with other circulatory complications: Secondary | ICD-10-CM

## 2021-01-26 DIAGNOSIS — G3183 Dementia with Lewy bodies: Secondary | ICD-10-CM

## 2021-01-26 NOTE — Progress Notes (Signed)
I,[name],acting as a scribe for Wilhemena Durie, MD.,have documented all relevant documentation on the behalf of Wilhemena Durie, MD,as directed by  Wilhemena Durie, MD while in the presence of Wilhemena Durie, MD.   Established patient visit   Patient: [name]   DOB: Aug 26, 1929   87 y.o. Female  MRN: [id number] Visit Date: 01/26/2021  Today's healthcare provider: Wilhemena Durie, MD   Chief Complaint  Patient presents with  . Follow-up  . Hypertension  . Anemia   Subjective    HPI  Patient is a couple of small chronic complaints.  She has irritation of the right ear and some chronic right leg pain. Overall everything is stable.  Condition continues to be an issue.Son   Brings her in today and states she has been doing fairly well lately.  No recent falls. She is followed by endocrinology for diabetes. Hypertension, follow-up  BP Readings from Last 3 Encounters:  01/26/21 (!) 165/74  11/18/20 (!) 148/79  10/16/20 (!) 148/80   Wt Readings from Last 3 Encounters:  01/26/21 147 lb (66.7 kg)  11/18/20 148 lb (67.1 kg)  10/16/20 149 lb (67.6 kg)     She was last seen for hypertension 2 months ago.  BP at that visit was 148/79. Management since that visit includes; Fairly good control. She reports good compliance with treatment. She is not having side effects. none She is not exercising. She is adherent to low salt diet.   Outside blood pressures are none.  She does not smoke.  Use of agents associated with hypertension: none.   -------------------------------------------------------------------- Anemia, unspecified type From 11/18/2020-Iron deficiency anemia chronic disease and chronic kidney disease. Would not do GI work-up unless patient is very symptomatic.  Loose stools From 11/18/2020-Probiotic daily.  Lewy body dementia without behavioral disturbance ([place]) From 11/18/2020-Patient on Namenda and Seroquel.        Medications: Outpatient Medications Prior to Visit  Medication Sig  . ADVAIR HFA 230-21 MCG/ACT inhaler TAKE 2 PUFFS INTO LUNGS TWICE A DAY  . albuterol (VENTOLIN HFA) 108 (90 Base) MCG/ACT inhaler INHALE 2 PUFFS INTO THE LUNGS EVERY 4 HOURS AS NEEDED FOR WHEEZING ORSHORTNESS OF BREATH  . Ascorbic Acid (VITAMIN C) 1000 MG tablet Take 1,000 mg by mouth daily.   Marland Kitchen aspirin 325 MG tablet Take 325 mg by mouth daily. Reported on 01/05/2016  . atorvastatin (LIPITOR) 10 MG tablet Take 1 tablet (10 mg total) by mouth daily.  . cetirizine (ZYRTEC) 10 MG tablet TAKE ONE TABLET BY MOUTH EVERY DAY  . cholecalciferol (VITAMIN D3) 25 MCG (1000 UT) tablet Take 1,000 Units by mouth daily.  . clopidogrel (PLAVIX) 75 MG tablet TAKE 1 TABLET BY MOUTH DAILY  . ezetimibe (ZETIA) 10 MG tablet TAKE 1 TABLET BY MOUTH DAILY  . FLUoxetine (PROZAC) 20 MG capsule TAKE 1 CAPSULE EVERY DAY  . glucose blood test strip ACCU-CHEK AVIVA PLUS TEST STRP Use to check sugars 3 times daily.  . insulin glargine (LANTUS) 100 UNIT/ML injection Inject 0.05 mLs (5 Units total) into the skin at bedtime.  . insulin lispro (HUMALOG) 100 UNIT/ML KiwkPen Inject 10 Units into the skin 3 (three) times daily. 10 units before breakfast, 8 before lunch and 18 before supper  . isosorbide mononitrate (IMDUR) 30 MG 24 hr tablet Take 30 mg by mouth daily.  . lansoprazole (PREVACID) 30 MG capsule TAKE 1 CAPSULE BY MOUTH ONCE DAILY AT NOON  . losartan (COZAAR) 100 MG  tablet TAKE ONE TABLET EVERY DAY  . Magnesium 400 MG CAPS Take by mouth daily.  . meloxicam (MOBIC) 7.5 MG tablet Take 1 tablet (7.5 mg total) by mouth daily as needed for pain.  . memantine (NAMENDA) 5 MG tablet TAKE ONE TABLET TWICE DAILY  . montelukast (SINGULAIR) 10 MG tablet TAKE ONE TABLET AT BEDTIME  . pantoprazole (PROTONIX) 40 MG tablet Take 40 mg by mouth daily.  . potassium chloride (KLOR-CON) 10 MEQ tablet TAKE 1 TABLET BY MOUTH TWICE DAILY  . predniSONE (DELTASONE) 20 MG  tablet Take 1 tablet (20 mg total) by mouth daily with breakfast.  . QUEtiapine (SEROQUEL) 25 MG tablet Take 1 tablet by mouth at bedtime.  [name] ER 360 MG 24 hr capsule TAKE 1 CAPSULE EVERY DAY  . traZODone (DESYREL) 50 MG tablet TAKE 1 AND 1/2 TABLET AT BEDTIME AS NEEDED FOR SLEEP  . TUSSIN DM 100-10 MG/5ML liquid TAKE 1 TEASPOONFUL BY MOUTH EVERY 4 HOURS AS NEEDED FOR COUGH  . vitamin E 1000 UNIT capsule Take 1,000 Units by mouth daily.  . Fluticasone-Salmeterol (ADVAIR DISKUS) 250-50 MCG/DOSE AEPB Inhale 1 puff into the lungs 2 (two) times daily.   No facility-administered medications prior to visit.    Review of Systems  Constitutional: Negative for appetite change, chills, fatigue and fever.  Respiratory: Negative for chest tightness and shortness of breath.   Cardiovascular: Negative for chest pain and palpitations.  Gastrointestinal: Negative for abdominal pain, nausea and vomiting.  Neurological: Negative for dizziness and weakness.        Objective    BP (!) 165/74 (BP Location: Left Arm, Patient Position: Sitting, Cuff Size: Normal)   Pulse 81   Temp 98 F (36.7 C) (Oral)   Resp 18   Ht 5\' 2"  (1.575 m)   Wt 147 lb (66.7 kg)   SpO2 96%   BMI 26.89 kg/m  BP Readings from Last 3 Encounters:  01/27/21 124/80  01/26/21 (!) 165/74  11/18/20 (!) 148/79   Wt Readings from Last 3 Encounters:  01/27/21 146 lb 3.2 oz (66.3 kg)  01/26/21 147 lb (66.7 kg)  11/18/20 148 lb (67.1 kg)       Physical Exam Vitals reviewed.  Constitutional:      Appearance: She is well-developed.  HENT:     Head: Normocephalic and atraumatic.     Right Ear: Tympanic membrane and external ear normal.     Left Ear: Tympanic membrane, ear canal and external ear normal.     Ears:     Comments: Both TMs blocked by cerumen Eyes:     General: No scleral icterus.    Conjunctiva/sclera: Conjunctivae normal.  Neck:     Thyroid: No thyromegaly.     Vascular: No carotid bruit.   Cardiovascular:     Rate and Rhythm: Normal rate and regular rhythm.     Heart sounds: Normal heart sounds.  Pulmonary:     Effort: Pulmonary effort is normal.     Breath sounds: Normal breath sounds.  Abdominal:     Palpations: Abdomen is soft.  Musculoskeletal:     Comments: Normal exam of her lower extremities  Skin:    General: Skin is warm and dry.     Findings: Laceration present.     Comments: Very fair skin.  Neurological:     General: No focal deficit present.     Mental Status: She is alert and oriented to person, place, and time.  Psychiatric:  Mood and Affect: Mood normal.        Behavior: Behavior normal.       No results found for any visits on 01/26/21.  Assessment & Plan     1. Benign essential HTN Controlled losartan and diltiazem  2. Coronary artery disease of native artery of native heart with stable angina pectoris ([place]) All risk factors treated  3. Type 2 diabetes mellitus with vascular disease [facility]) Per endocrinology  4. Lewy body dementia without behavioral disturbance [facility]) Patient doing fairly well at this time.  5. Hypercholesteremia On atorvastatin 10  6. Pain in both lower extremities Is likely arthritic.  I do not think this is PAD.  7. Excessive cerumen in both ear canals Irrigated today   No follow-ups on file.      I, Wilhemena Durie, MD, have reviewed all documentation for this visit. The documentation on 01/30/21 for the exam, diagnosis, procedures, and orders are all accurate and complete.    Zora Glendenning Cranford Mon, MD  [facility] [phone] (phone) [phone] (fax)  [place]

## 2021-01-27 ENCOUNTER — Other Ambulatory Visit: Payer: Self-pay

## 2021-01-27 ENCOUNTER — Ambulatory Visit: Payer: Medicare Other | Admitting: Internal Medicine

## 2021-01-27 ENCOUNTER — Encounter: Payer: Self-pay | Admitting: Internal Medicine

## 2021-01-27 VITALS — BP 124/80 | HR 82 | Temp 97.5°F | Ht 62.0 in | Wt 146.2 lb

## 2021-01-27 DIAGNOSIS — J411 Mucopurulent chronic bronchitis: Secondary | ICD-10-CM | POA: Diagnosis not present

## 2021-01-27 DIAGNOSIS — J479 Bronchiectasis, uncomplicated: Secondary | ICD-10-CM | POA: Diagnosis not present

## 2021-01-27 DIAGNOSIS — E113293 Type 2 diabetes mellitus with mild nonproliferative diabetic retinopathy without macular edema, bilateral: Secondary | ICD-10-CM | POA: Diagnosis not present

## 2021-01-27 NOTE — Patient Instructions (Signed)
Continue flutter valve and incentive spirometry as prescribed Continue inhalers as prescribed

## 2021-01-27 NOTE — Progress Notes (Signed)
Name: [name] MRN: [id number] DOB: 12/19/1928     PREVIOUS HISTORY OF PRESENT ILLNESS:   87 yo white female seen with chronic cough for more than 10 years-intermittent  +second hand smoke exposure +h/o allergic rhinitis(on zytrtec,singulair) +h/o GERD (on protonix) She is NON smoker She worked as Nurse, learning disability panty hose pneumonia 3 years ago Has had prednisone use 10 years ago for psoriasis She has been prescribed albuterol asn has not made a difference Offcie SPiro-WNL Flow volume loops show some evidence of scooping which could be related to obstructive airways disease  CONSULTATION DATE: .01/27/2021   REFERRING MD : Rosanna Randy  CHIEF COMPLAINT:  Follow-up COPD Follow-up chronic bronchitis    HPI  No exacerbation at this time No evidence of heart failure at this time No evidence or signs of infection at this time No respiratory distress No fevers, chills, nausea, vomiting, diarrhea No evidence of lower extremity edema No evidence hemoptysis   Patient has not been using her flutter valve or incentive spirometry I have encouraged to use this every day as much as possible This will  prevent pulmonary infections  Patient does not need oxygen at this Patient does not need antibiotics at this time Patient does not need prednisone at this time     Review of Systems:  Gen:  Denies  fever, sweats, chills weight loss  HEENT: Denies blurred vision, double vision, ear pain, eye pain, hearing loss, nose bleeds, sore throat Cardiac:  No dizziness, chest pain or heaviness, chest tightness,edema, No JVD Resp:   No cough, -sputum production, +shortness of breath,-wheezing, -hemoptysis,  Gi: Denies swallowing difficulty, stomach pain, nausea or vomiting, diarrhea, constipation, bowel incontinence Gu:  Denies bladder incontinence, burning urine Ext:   Denies Joint pain, stiffness or swelling Skin: Denies  skin rash, easy bruising or bleeding or  hives Endoc:  Denies polyuria, polydipsia , polyphagia or weight change Psych:   Denies depression, insomnia or hallucinations  Other:  All other systems negative  BP 124/80 (BP Location: Right Arm, Cuff Size: Normal)   Pulse 82   Temp (!) 97.5 F (36.4 C) (Temporal)   Ht 5\' 2"  (1.575 m)   Wt 146 lb 3.2 oz (66.3 kg)   SpO2 96%   BMI 26.74 kg/m  Physical Examination:   General Appearance: No distress  Neuro:without focal findings,  speech normal,  HEENT: PERRLA, EOM intact.   Pulmonary: normal breath sounds, No wheezing.  CardiovascularNormal S1,S2.  No m/r/g.   Abdomen: Benign, Soft, non-tender. Renal:  No costovertebral tenderness  GU:  Not performed at this time. Endoc: No evident thyromegaly Skin:   warm, no rashes, no ecchymosis  Extremities: normal, no cyanosis, clubbing. PSYCHIATRIC: Mood, affect within normal limits.   ALL OTHER ROS ARE NEGATIVE         CT chest October 2017  No PE no effusions no opacities  Some evidence of bronchiectasis in the lower lobes      ASSESSMENT / PLAN:  Chronic cough and chronic sputum production consistent with COPD with chronic bronchitis -flow-volume loop suggestive of chronic bronchitis with COPD in the setting of bilateral lower lobe bronchiectasis    Continue Advair HFA as prescribed   albuterol as needed   Bronchiectasis  Continue aggressive bronchial hygiene  Flutter valve 10-15 times per day   incentive spirometry 10-15 times per day   Assess patient for chest physiotherapy She has b/l bronchiectasis and has tried and failed flutter valve and incentive spirometry  COPD No exacerbation at this time   MEDICATION ADJUSTMENTS/LABS AND TESTS ORDERED: Continue flutter valve and incentive spirometry as prescribed Continue inhalers as prescribed   CURRENT MEDICATIONS REVIEWED AT LENGTH WITH PATIENT TODAY   Patient satisfied with Plan of action and management. All questions answered  Follow-up in 1  year  Total time spent 21 minutes   [name], M.D.  [name]  Medical Director [place] Director [facility] Cardio-Pulmonary Department

## 2021-01-30 ENCOUNTER — Other Ambulatory Visit: Payer: Self-pay | Admitting: Family Medicine

## 2021-01-30 DIAGNOSIS — E876 Hypokalemia: Secondary | ICD-10-CM

## 2021-02-04 ENCOUNTER — Other Ambulatory Visit: Payer: Self-pay | Admitting: Internal Medicine

## 2021-02-04 DIAGNOSIS — J449 Chronic obstructive pulmonary disease, unspecified: Secondary | ICD-10-CM

## 2021-02-06 DIAGNOSIS — Z03818 Encounter for observation for suspected exposure to other biological agents ruled out: Secondary | ICD-10-CM | POA: Diagnosis not present

## 2021-02-06 DIAGNOSIS — R6889 Other general symptoms and signs: Secondary | ICD-10-CM | POA: Diagnosis not present

## 2021-02-06 DIAGNOSIS — R110 Nausea: Secondary | ICD-10-CM | POA: Diagnosis not present

## 2021-02-06 DIAGNOSIS — U071 COVID-19: Secondary | ICD-10-CM | POA: Diagnosis not present

## 2021-02-06 DIAGNOSIS — B9689 Other specified bacterial agents as the cause of diseases classified elsewhere: Secondary | ICD-10-CM | POA: Diagnosis not present

## 2021-02-06 DIAGNOSIS — J209 Acute bronchitis, unspecified: Secondary | ICD-10-CM | POA: Diagnosis not present

## 2021-02-06 DIAGNOSIS — J019 Acute sinusitis, unspecified: Secondary | ICD-10-CM | POA: Diagnosis not present

## 2021-02-08 ENCOUNTER — Other Ambulatory Visit: Payer: Self-pay

## 2021-02-08 ENCOUNTER — Emergency Department
Admission: EM | Admit: 2021-02-08 | Discharge: 2021-02-08 | Disposition: A | Discharge status: Home / Self Care | Payer: Medicare Other | Attending: Emergency Medicine | Admitting: Emergency Medicine

## 2021-02-08 ENCOUNTER — Emergency Department: Payer: Medicare Other

## 2021-02-08 DIAGNOSIS — Z91040 Latex allergy status: Secondary | ICD-10-CM | POA: Insufficient documentation

## 2021-02-08 DIAGNOSIS — Z7951 Long term (current) use of inhaled steroids: Secondary | ICD-10-CM | POA: Insufficient documentation

## 2021-02-08 DIAGNOSIS — I509 Heart failure, unspecified: Secondary | ICD-10-CM | POA: Insufficient documentation

## 2021-02-08 DIAGNOSIS — W010XXA Fall on same level from slipping, tripping and stumbling without subsequent striking against object, initial encounter: Secondary | ICD-10-CM | POA: Insufficient documentation

## 2021-02-08 DIAGNOSIS — I130 Hypertensive heart and chronic kidney disease with heart failure and stage 1 through stage 4 chronic kidney disease, or unspecified chronic kidney disease: Secondary | ICD-10-CM | POA: Diagnosis not present

## 2021-02-08 DIAGNOSIS — E1140 Type 2 diabetes mellitus with diabetic neuropathy, unspecified: Secondary | ICD-10-CM | POA: Diagnosis not present

## 2021-02-08 DIAGNOSIS — S81832A Puncture wound without foreign body, left lower leg, initial encounter: Secondary | ICD-10-CM | POA: Insufficient documentation

## 2021-02-08 DIAGNOSIS — S91012A Laceration without foreign body, left ankle, initial encounter: Secondary | ICD-10-CM | POA: Diagnosis not present

## 2021-02-08 DIAGNOSIS — Z743 Need for continuous supervision: Secondary | ICD-10-CM | POA: Diagnosis not present

## 2021-02-08 DIAGNOSIS — Z79899 Other long term (current) drug therapy: Secondary | ICD-10-CM | POA: Insufficient documentation

## 2021-02-08 DIAGNOSIS — I2510 Atherosclerotic heart disease of native coronary artery without angina pectoris: Secondary | ICD-10-CM | POA: Diagnosis not present

## 2021-02-08 DIAGNOSIS — S8992XA Unspecified injury of left lower leg, initial encounter: Secondary | ICD-10-CM | POA: Diagnosis present

## 2021-02-08 DIAGNOSIS — G3183 Dementia with Lewy bodies: Secondary | ICD-10-CM | POA: Insufficient documentation

## 2021-02-08 DIAGNOSIS — N189 Chronic kidney disease, unspecified: Secondary | ICD-10-CM | POA: Diagnosis not present

## 2021-02-08 DIAGNOSIS — E1122 Type 2 diabetes mellitus with diabetic chronic kidney disease: Secondary | ICD-10-CM | POA: Diagnosis not present

## 2021-02-08 DIAGNOSIS — Z794 Long term (current) use of insulin: Secondary | ICD-10-CM | POA: Insufficient documentation

## 2021-02-08 DIAGNOSIS — E1165 Type 2 diabetes mellitus with hyperglycemia: Secondary | ICD-10-CM | POA: Insufficient documentation

## 2021-02-08 DIAGNOSIS — Z7982 Long term (current) use of aspirin: Secondary | ICD-10-CM | POA: Diagnosis not present

## 2021-02-08 DIAGNOSIS — R58 Hemorrhage, not elsewhere classified: Secondary | ICD-10-CM | POA: Diagnosis not present

## 2021-02-08 DIAGNOSIS — Z7902 Long term (current) use of antithrombotics/antiplatelets: Secondary | ICD-10-CM | POA: Diagnosis not present

## 2021-02-08 DIAGNOSIS — R739 Hyperglycemia, unspecified: Secondary | ICD-10-CM

## 2021-02-08 DIAGNOSIS — J449 Chronic obstructive pulmonary disease, unspecified: Secondary | ICD-10-CM | POA: Insufficient documentation

## 2021-02-08 DIAGNOSIS — I10 Essential (primary) hypertension: Secondary | ICD-10-CM | POA: Diagnosis not present

## 2021-02-08 DIAGNOSIS — U071 COVID-19: Secondary | ICD-10-CM

## 2021-02-08 DIAGNOSIS — Z85828 Personal history of other malignant neoplasm of skin: Secondary | ICD-10-CM | POA: Diagnosis not present

## 2021-02-08 DIAGNOSIS — S81812A Laceration without foreign body, left lower leg, initial encounter: Secondary | ICD-10-CM | POA: Diagnosis not present

## 2021-02-08 DIAGNOSIS — Z23 Encounter for immunization: Secondary | ICD-10-CM | POA: Diagnosis not present

## 2021-02-08 LAB — CBC WITH DIFFERENTIAL/PLATELET
Abs Immature Granulocytes: 0.03 10*3/uL (ref 0.00–0.07)
Basophils Absolute: 0 10*3/uL (ref 0.0–0.1)
Basophils Relative: 0 %
Eosinophils Absolute: 0.1 10*3/uL (ref 0.0–0.5)
Eosinophils Relative: 1 %
HCT: 29.9 % — ABNORMAL LOW (ref 36.0–46.0)
Hemoglobin: 10.2 g/dL — ABNORMAL LOW (ref 12.0–15.0)
Immature Granulocytes: 0 %
Lymphocytes Relative: 32 %
Lymphs Abs: 2.3 10*3/uL (ref 0.7–4.0)
MCH: 30.1 pg (ref 26.0–34.0)
MCHC: 34.1 g/dL (ref 30.0–36.0)
MCV: 88.2 fL (ref 80.0–100.0)
Monocytes Absolute: 1 10*3/uL (ref 0.1–1.0)
Monocytes Relative: 13 %
Neutro Abs: 3.8 10*3/uL (ref 1.7–7.7)
Neutrophils Relative %: 54 %
Platelets: 215 10*3/uL (ref 150–400)
RBC: 3.39 MIL/uL — ABNORMAL LOW (ref 3.87–5.11)
RDW: 15 % (ref 11.5–15.5)
WBC: 7.3 10*3/uL (ref 4.0–10.5)
nRBC: 0.3 % — ABNORMAL HIGH (ref 0.0–0.2)

## 2021-02-08 LAB — COMPREHENSIVE METABOLIC PANEL
ALT: 14 U/L (ref 0–44)
AST: 19 U/L (ref 15–41)
Albumin: 2.9 g/dL — ABNORMAL LOW (ref 3.5–5.0)
Alkaline Phosphatase: 65 U/L (ref 38–126)
Anion gap: 10 (ref 5–15)
BUN: 22 mg/dL (ref 8–23)
CO2: 22 mmol/L (ref 22–32)
Calcium: 7.9 mg/dL — ABNORMAL LOW (ref 8.9–10.3)
Chloride: 99 mmol/L (ref 98–111)
Creatinine, Ser: 1.06 mg/dL — ABNORMAL HIGH (ref 0.44–1.00)
GFR, Estimated: 50 mL/min — ABNORMAL LOW (ref >60)
Glucose, Bld: 325 mg/dL — ABNORMAL HIGH (ref 70–99)
Potassium: 3.7 mmol/L (ref 3.5–5.1)
Sodium: 131 mmol/L — ABNORMAL LOW (ref 135–145)
Total Bilirubin: 0.5 mg/dL (ref 0.3–1.2)
Total Protein: 5.5 g/dL — ABNORMAL LOW (ref 6.5–8.1)

## 2021-02-08 LAB — HEMOGLOBIN A1C
Hgb A1c MFr Bld: 7.6 % — ABNORMAL HIGH (ref 4.8–5.6)
Mean Plasma Glucose: 171.42 mg/dL

## 2021-02-08 LAB — CBG MONITORING, ED
Glucose-Capillary: 295 mg/dL — ABNORMAL HIGH (ref 70–99)
Glucose-Capillary: 221 mg/dL — ABNORMAL HIGH (ref 70–99)

## 2021-02-08 MED ORDER — TETANUS-DIPHTH-ACELL PERTUSSIS 5-2.5-18.5 LF-MCG/0.5 IM SUSY
0.5000 mL | PREFILLED_SYRINGE | Freq: Once | INTRAMUSCULAR | Status: AC
  Administered 2021-02-08: 0.5 mL via INTRAMUSCULAR
  Filled 2021-02-08: qty 0.5

## 2021-02-08 MED ORDER — MICROFIBRILLAR COLL HEMOSTAT EX POWD
Freq: Once | CUTANEOUS | Status: AC
  Administered 2021-02-08: 1 g via TOPICAL
  Filled 2021-02-08: qty 5

## 2021-02-08 MED ORDER — MICROFIBRILLAR COLL HEMOSTAT EX POWD
1.0000 g | Freq: Once | CUTANEOUS | Status: DC
  Filled 2021-02-08 (×2): qty 5

## 2021-02-08 MED ORDER — ACETAMINOPHEN 500 MG PO TABS
1000.0000 mg | ORAL_TABLET | Freq: Once | ORAL | Status: AC
  Administered 2021-02-08: 1000 mg via ORAL
  Filled 2021-02-08: qty 2

## 2021-02-08 MED ORDER — INSULIN ASPART 100 UNIT/ML IJ SOLN
0.0000 [IU] | INTRAMUSCULAR | Status: DC
  Administered 2021-02-08: 8 [IU] via SUBCUTANEOUS
  Filled 2021-02-08: qty 1

## 2021-02-08 NOTE — ED Notes (Signed)
Pharmacy called d/t delay in receiving medication.

## 2021-02-08 NOTE — ED Provider Notes (Addendum)
[facility] Emergency Department Provider Note  ____________________________________________   Event Date/Time   First MD Initiated Contact with Patient 02/08/21 1211     (approximate)  I have reviewed the triage vital signs and the nursing notes.   HISTORY  Chief Complaint Fall and Laceration   HPI [name] is a 87 y.o. female with a past medical history of paroxysmal A. fib not anticoagulated, CAD, DM, HTN, HDL, COPD, chronic back pain and recent diagnosis of COVID last week who presents EMS from home after she tripped while stepping outside her home to pick up some food some neighbors left to her and cutting her left ankle.  She states she had a cough for the last couple days but has not had any shortness of breath, headache earache, sore throat, fevers, nausea, vomiting, diarrhea, dysuria, rash, abdominal pain, back pain or any other recent injuries or falls.  She states she did not pass out before after falling today and did not hit her head or neck and has no other pain other than at the left ankle.  She is not sure when her last tetanus shot was.  She is on Plavix but is not on any other blood thinners.  No other acute concerns at this time.          Past Medical History:  Diagnosis Date  . Angina pectoris ([place])   . Atrial fibrillation ([place])   . Basal cell carcinoma    nose  . Cervicalgia   . Chronic back pain   . Chronic obstructive pulmonary disease (COPD) ([place])   . COPD (chronic obstructive pulmonary disease) ([place])   . DDD (degenerative disc disease), lumbar   . Diabetes mellitus without complication ([place])   . Dysphagia   . Hyperlipemia   . Hypertension   . Squamous cell carcinoma of skin    left lateral pretibial  . Vulvovaginitis     Patient Active Problem List   Diagnosis Date Noted  . Coronary artery disease involving native coronary artery of native heart 04/17/2020  . Celiac artery stenosis ([place]) 06/12/2019  .  Bilateral lower abdominal cramping 04/20/2019  . UTI symptoms 04/20/2019  . History of rectal bleeding 03/26/2019  . DM type 2 with diabetic peripheral neuropathy ([place]) 12/14/2018  . Arthropathy of lumbar facet joint 09/04/2018  . Degeneration of lumbar intervertebral disc 09/04/2018  . Spondylolisthesis, grade 1 09/04/2018  . Lewy body dementia ([place]) 02/17/2018  . PVD (peripheral vascular disease) ([place]) 08/16/2017  . Pain in limb 07/27/2016  . Hallucinations 07/07/2016  . Chest pain 07/01/2016  . Depression 06/23/2016  . Hypokalemia 09/03/2015  . Insomnia 07/23/2015  . Headache 07/23/2015  . Type 2 diabetes mellitus with vascular disease ([place]) 06/24/2015  . Chronic vulvitis 06/02/2015  . Allergic rhinitis 05/30/2015  . Back ache 05/30/2015  . Body mass index (BMI) of 29.0-29.9 in adult 05/30/2015  . Edema 05/30/2015  . Dilatation of esophagus 05/30/2015  . Fall 05/30/2015  . H/O deep venous thrombosis 05/30/2015  . Hypercholesteremia 05/30/2015  . Heart & renal disease, hypertensive, with heart failure ([place]) 05/30/2015  . Below normal amount of sodium in the blood 05/30/2015  . Calculus of kidney 05/30/2015  . Gonalgia 05/30/2015  . Psoriasis 05/30/2015  . Avitaminosis D 05/30/2015  . Cough 04/01/2015  . Combined fat and carbohydrate induced hyperlipemia 12/30/2014  . Paroxysmal atrial fibrillation ([place]) 07/10/2014  . Abnormal gait 07/08/2014  . Anxiety state 07/08/2014  . Chronic kidney disease 07/08/2014  .  Acid reflux 07/08/2014  . 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[place]) 07/08/2014  . Chronic obstructive pulmonary disease ([place]) 07/08/2014  . Type II diabetes mellitus with renal manifestations ([place]) 07/08/2014  . Benign essential HTN 06/26/2014  . Carotid artery narrowing 06/26/2014  . Myalgia 06/26/2014  . Basal cell carcinoma of face 05/01/2014  . Disease of female genital organs 11/28/2012  . Incomplete bladder emptying 09/05/2012  . Excessive urination at night  08/15/2012  . Basal cell carcinoma of ear 10/26/2011    Past Surgical History:  Procedure Laterality Date  . gall bladder    . melanoma     removal neck and back  . PERIPHERAL VASCULAR CATHETERIZATION Left 01/05/2016   Procedure: Lower Extremity Angiography;  Surgeon: Algernon Huxley, MD;  Location: [place] [facility];  Service: Cardiovascular;  Laterality: Left;  . PERIPHERAL VASCULAR CATHETERIZATION  01/05/2016   Procedure: Lower Extremity Intervention;  Surgeon: Algernon Huxley, MD;  Location: [place] [facility];  Service: Cardiovascular;;  . ureterolithiasis     calculus removed  . VAGINAL HYSTERECTOMY    . VISCERAL ANGIOGRAPHY N/A 05/10/2019   Procedure: VISCERAL ANGIOGRAPHY;  Surgeon: Algernon Huxley, MD;  Location: [place] [facility];  Service: Cardiovascular;  Laterality: N/A;  . VULVA / PERINEUM BIOPSY  05/29/2015    Prior to Admission medications   Medication Sig Start Date End Date Taking? Authorizing Provider  ADVAIR HFA 230-21 MCG/ACT inhaler TAKE 2 PUFFS INTO LUNGS TWICE A DAY 02/04/21   Flora Lipps, MD  albuterol (VENTOLIN HFA) 108 (90 Base) MCG/ACT inhaler INHALE 2 PUFFS INTO THE LUNGS EVERY 4 HOURS AS NEEDED FOR WHEEZING ORSHORTNESS OF BREATH 07/04/19   Flora Lipps, MD  Ascorbic Acid (VITAMIN C) 1000 MG tablet Take 1,000 mg by mouth daily.     [provider]  aspirin 325 MG tablet Take 325 mg by mouth daily. Reported on 01/05/2016    [provider]  atorvastatin (LIPITOR) 10 MG tablet Take 1 tablet (10 mg total) by mouth daily. 04/30/20 04/30/21  Jerrol Banana., MD  cetirizine (ZYRTEC) 10 MG tablet TAKE ONE TABLET BY MOUTH EVERY DAY 12/12/18   Jerrol Banana., MD  cholecalciferol (VITAMIN D3) 25 MCG (1000 UT) tablet Take 1,000 Units by mouth daily.    [provider]  clopidogrel (PLAVIX) 75 MG tablet TAKE 1 TABLET BY MOUTH DAILY 04/29/20   Jerrol Banana., MD  ezetimibe (ZETIA) 10 MG tablet TAKE 1 TABLET BY MOUTH DAILY 04/29/20    Jerrol Banana., MD  FLUoxetine (PROZAC) 20 MG capsule TAKE 1 CAPSULE EVERY DAY 01/02/21   Flinchum, Kelby Aline, FNP  glucose blood test strip ACCU-CHEK AVIVA PLUS TEST STRP Use to check sugars 3 times daily. 10/28/15   [provider]  insulin glargine (LANTUS) 100 UNIT/ML injection Inject 0.05 mLs (5 Units total) into the skin at bedtime. 07/02/16   Epifanio Lesches, MD  insulin lispro (HUMALOG) 100 UNIT/ML KiwkPen Inject 10 Units into the skin 3 (three) times daily. 10 units before breakfast, 8 before lunch and 18 before supper    [provider]  isosorbide mononitrate (IMDUR) 30 MG 24 hr tablet Take 30 mg by mouth daily. 06/12/20   [provider]  lansoprazole (PREVACID) 30 MG capsule TAKE 1 CAPSULE BY MOUTH ONCE DAILY AT NOON 12/03/20   Jerrol Banana., MD  losartan (COZAAR) 100 MG tablet TAKE ONE TABLET EVERY DAY 11/12/20   Jerrol Banana., MD  Magnesium 400 MG CAPS Take by mouth daily.    [provider]  meloxicam (MOBIC) 7.5 MG tablet Take 1 tablet (7.5 mg total) by mouth daily as needed for pain. 03/18/20   Jerrol Banana., MD  mem[facility]) 5 MG tablet TAKE ONE TABLET TWICE DAILY 12/03/20   Jerrol Banana., MD  montelukast (SINGULAIR) 10 MG tablet TAKE ONE TABLET AT BEDTIME 12/03/20   Jerrol Banana., MD  pantoprazole (PROTONIX) 40 MG tablet Take 40 mg by mouth daily.    [provider]  potassium chloride (KLOR-CON) 10 MEQ tablet TAKE 1 TABLET BY MOUTH TWICE DAILY 01/30/21   Jerrol Banana., MD  QUEtiapine (SEROQUEL) 25 MG tablet Take 1 tablet by mouth at bedtime. 03/28/20   [provider]  TIADYLT ER 360 MG 24 hr capsule TAKE 1 CAPSULE EVERY DAY 11/12/20   Jerrol Banana., MD  traZODone (DESYREL) 50 MG tablet TAKE 1 AND 1/2 TABLET AT BEDTIME AS NEEDED FOR SLEEP 10/14/20   Jerrol Banana., MD  TUSSIN DM 100-10 MG/5ML liquid TAKE 1 TEASPOONFUL BY MOUTH EVERY 4 HOURS AS NEEDED FOR  COUGH 08/20/19   Flora Lipps, MD  vitamin E 1000 UNIT capsule Take 1,000 Units by mouth daily.    [provider]    Allergies Bacitracin-neomycin-polymyxin, Neomycin-bacitracin zn-polymyx, Latex, Lidocaine, Aricept [donepezil hcl], Benzalkonium chloride, Ibuprofen, Valdecoxib, Albuterol, Tape, and Triamcinolone  Family History  Problem Relation Age of Onset  . Ovarian cancer Other   . Diabetes Sister   . Colon cancer Brother   . Diabetes Brother   . Atrial fibrillation Daughter   . Breast cancer Neg Hx     Social History Social History   Tobacco Use  . Smoking status: Never Smoker  . Smokeless tobacco: Never Used  Vaping Use  . Vaping Use: Never used  Substance Use Topics  . Alcohol use: No  . Drug use: No    Review of Systems  Review of Systems  Constitutional: Negative for chills and fever.  HENT: Negative for sore throat.   Eyes: Negative for pain.  Respiratory: Positive for cough. Negative for stridor.   Cardiovascular: Negative for chest pain.  Gastrointestinal: Negative for vomiting.  Musculoskeletal: Positive for joint pain ( L ankle).  Skin: Negative for rash.  Neurological: Negative for seizures, loss of consciousness and headaches.  Psychiatric/Behavioral: Negative for suicidal ideas.  All other systems reviewed and are negative.     ____________________________________________   PHYSICAL EXAM:  VITAL SIGNS: ED Triage Vitals  Enc Vitals Group     BP 02/08/21 1156 117/62     Pulse Rate 02/08/21 1156 88     Resp 02/08/21 1156 18     Temp 02/08/21 1156 97.9 F (36.6 C)     Temp Source 02/08/21 1156 Oral     SpO2 02/08/21 1156 97 %     Weight 02/08/21 1157 145 lb 8.1 oz (66 kg)     Height 02/08/21 1157 5\' 2"  (1.575 m)     Head Circumference --      Peak Flow --      Pain Score 02/08/21 1157 4     Pain Loc --      Pain Edu? --      Excl. in [place]? --    Vitals:   02/08/21 1430 02/08/21 1500  BP: (!) 114/58 (!) 108/55  Pulse: 79 78   Resp: 15 14  Temp:  SpO2: 97% 97%   Physical Exam Vitals and nursing note reviewed.  Constitutional:      General: She is not in acute distress.    Appearance: She is well-developed.  HENT:     Head: Normocephalic and atraumatic.     Right Ear: External ear normal.     Left Ear: External ear normal.     Nose: Nose normal.  Eyes:     Conjunctiva/sclera: Conjunctivae normal.  Cardiovascular:     Rate and Rhythm: Normal rate and regular rhythm.     Heart sounds: No murmur heard.   Pulmonary:     Effort: Pulmonary effort is normal. No respiratory distress.     Breath sounds: Normal breath sounds.  Abdominal:     Palpations: Abdomen is soft.     Tenderness: There is no abdominal tenderness.  Musculoskeletal:     Cervical back: Neck supple.  Skin:    General: Skin is warm and dry.     Capillary Refill: Capillary refill takes less than 2 seconds.  Neurological:     Mental Status: She is alert and oriented to person, place, and time.  Psychiatric:        Mood and Affect: Mood normal.     Patient has approximately 6 cm laceration shaped in an L-shaped over the left ankle.  No exposed bone.  There is underlying muscle visible.  2+ DP pulse.  Patient has full sensation throughout the left foot.  No other obvious trauma of the dorsum or plantar aspect of the foot.  There is a small puncture wound immediately proximal to the lack.  She is able to range her ankle.  No other trauma to the medial left lower extremity or right lower extremity.  No trauma to the face scalp head or neck and no tenderness step-offs deformities over the C/T/L-spine. ____________________________________________   LABS (all labs ordered are listed, but only abnormal results are displayed)  Labs Reviewed  CBC WITH DIFFERENTIAL/PLATELET - Abnormal; Notable for the following components:      Result Value   RBC 3.39 (*)    Hemoglobin 10.2 (*)    HCT 29.9 (*)    nRBC 0.3 (*)    All other components within  normal limits  COMPREHENSIVE METABOLIC PANEL - Abnormal; Notable for the following components:   Sodium 131 (*)    Glucose, Bld 325 (*)    Creatinine, Ser 1.06 (*)    Calcium 7.9 (*)    Total Protein 5.5 (*)    Albumin 2.9 (*)    GFR, Estimated 50 (*)    All other components within normal limits  CBG MONITORING, ED - Abnormal; Notable for the following components:   Glucose-Capillary 295 (*)    All other components within normal limits  CBG MONITORING, ED - Abnormal; Notable for the following components:   Glucose-Capillary 221 (*)    All other components within normal limits  HEMOGLOBIN A1C   ____________________________________________  EKG  Sinus rhythm with a ventricular of 90, normal axis, unremarkable intervals without evidence of acute ischemia or other significant Arrhythmia. ____________________________________________  RADIOLOGY  ED MD interpretation: No acute fracture dislocation.  Official radiology report(s): DG Ankle Complete Left  Result Date: 02/08/2021 CLINICAL DATA:  87 year old who fell and sustained a laceration to the lateral LEFT ankle on glass. Initial encounter. EXAM: LEFT ANKLE COMPLETE - 3+ VIEW COMPARISON:  No prior LEFT ankle images. LEFT foot x-rays 02/13/2018 are correlated. FINDINGS: Examination was performed with bandage material in  place. No opaque foreign bodies in the soft tissues to suggest a glass shard. No evidence of acute fracture. Tibiotalar joint anatomically aligned with a well-preserved joint space. Osseous demineralization. No visible joint effusion. Small plantar calcaneal spur. IMPRESSION: 1. No acute osseous abnormality. 2. No opaque foreign body. 3. Osseous demineralization. Electronically Signed   By: Evangeline Dakin M.D.   On: 02/08/2021 13:48    ____________________________________________   PROCEDURES  Procedure(s) performed (including Critical Care):  Marland KitchenMarland KitchenLaceration Repair  Date/Time: 02/08/2021 12:43 PM Performed by:  Lucrezia Starch, MD Authorized by: Lucrezia Starch, MD   Consent:    Consent obtained:  Verbal   Consent given by:  Patient   Risks, benefits, and alternatives were discussed: yes     Risks discussed:  Infection, need for additional repair, nerve damage, poor wound healing, poor cosmetic result, pain, tendon damage and vascular damage   Alternatives discussed:  No treatment Universal protocol:    Procedure explained and questions answered to patient or proxy's satisfaction: yes     Patient identity confirmed:  Verbally with patient Anesthesia:    Anesthesia method:  None Laceration details:    Location:  Leg   Leg location:  L lower leg   Length (cm):  6 Exploration:    Hemostasis achieved with:  Direct pressure   Imaging obtained: x-ray     Imaging outcome: foreign body not noted     Wound exploration: wound explored through full range of motion     Wound extent: fascia violated and vascular damage     Contaminated: no   Treatment:    Area cleansed with:  Saline   Amount of cleaning:  Extensive   Irrigation solution:  Sterile saline   Irrigation method:  Syringe   Visualized foreign bodies/material removed: no     Debridement:  None   Undermining:  Minimal   Scar revision: no   Skin repair:    Repair method:  Sutures   Suture size:  3-0   Suture material:  Prolene   Suture technique:  Simple interrupted   Number of sutures:  8 Approximation:    Approximation:  Close Repair type:    Repair type:  Simple Post-procedure details:    Procedure completion:  Tolerated well, no immediate complications     ____________________________________________   INITIAL IMPRESSION / ASSESSMENT AND PLAN / ED COURSE      Presents with above-stated history and exam for assessment of a laceration she sustained to her left ankle when she stepped outside her home to pick up some food as by neighbors and tripped.  This is a setting of couple days of cough after diagnosis of COVID  last week.  CBC shows no leukocytosis and hemoglobin baseline.  CMP shows glucose of 325 without any other significant derangements.  With regard to recent COVID diagnosis she denies any other associate symptoms other than the cough.  In addition she has no fever or evidence of hypoxia or increased work of breathing abnormal breath sounds tachycardia tachypnea or any other evidence of respiratory distress on arrival.  Do not believe any additional work-up with regards to this is indicated at this time.  Laceration repaired and washed out per procedure note above.  There is little oozing after suture repair but hemostasis was achieved with Surgicel dressing.  Tetanus updated.  Patient given Tylenol.  X-ray shows no significant fracture dislocation.  Patient with some persistent oozing through sutures after initial hemostatic dressing applied.  We will  plan to apply Surgicel powder.  Assessment patient's dressing is hemostatic.  No additional oozing.  Discharged stable condition.  Strict return cautions advised and discussed. ____________________________________________   FINAL CLINICAL IMPRESSION(S) / ED DIAGNOSES  Final diagnoses:  Laceration of left ankle, initial encounter  Hyperglycemia  COVID    Medications  insulin aspart (novoLOG) injection 0-15 Units (8 Units Subcutaneous Given 02/08/21 1300)  microfibrillar collagen (AVITENE FLOUR) powder (has no administration in time range)  microfibrillar collagen (AVITENE FLOUR) powder 1 g (has no administration in time range)  Tdap (BOOSTRIX) injection 0.5 mL (0.5 mLs Intramuscular Given 02/08/21 1206)  acetaminophen (TYLENOL) tablet 1,000 mg (1,000 mg Oral Given 02/08/21 1206)     ED Discharge Orders    None       Note:  This document was prepared using Dragon voice recognition software and may include unintentional dictation errors.   Lucrezia Starch, MD 02/08/21 1534    Lucrezia Starch, MD 02/08/21 1600

## 2021-02-08 NOTE — ED Notes (Signed)
MD at bedside for suture repair of left ankle laceration

## 2021-02-08 NOTE — ED Notes (Signed)
Daughter Thayer Headings updated on plan of care

## 2021-02-08 NOTE — ED Triage Notes (Signed)
Patient presents to ER from home. Patient reports mechanical trip and fall while holding glass vase. Patient cut left ankle on broken glass. Patient denies hitting head. Patient reports taking plavix. Patient COVID+ 2 days ago.  Patient A&OX3   Patient given 536ml NS in route to ER.  20G LAC BGL 311

## 2021-02-08 NOTE — ED Notes (Signed)
New pressure dressing placed to left ankle. Patient to be discharged home.

## 2021-02-09 ENCOUNTER — Telehealth: Payer: Self-pay

## 2021-02-09 ENCOUNTER — Observation Stay
Admission: EM | Admit: 2021-02-09 | Discharge: 2021-02-12 | Disposition: A | Discharge status: Home / Self Care | Payer: Medicare Other | Attending: Internal Medicine | Admitting: Internal Medicine

## 2021-02-09 ENCOUNTER — Emergency Department: Payer: Medicare Other

## 2021-02-09 ENCOUNTER — Ambulatory Visit: Payer: Self-pay

## 2021-02-09 ENCOUNTER — Other Ambulatory Visit: Payer: Self-pay

## 2021-02-09 DIAGNOSIS — I129 Hypertensive chronic kidney disease with stage 1 through stage 4 chronic kidney disease, or unspecified chronic kidney disease: Secondary | ICD-10-CM | POA: Insufficient documentation

## 2021-02-09 DIAGNOSIS — R079 Chest pain, unspecified: Secondary | ICD-10-CM | POA: Diagnosis not present

## 2021-02-09 DIAGNOSIS — Z794 Long term (current) use of insulin: Secondary | ICD-10-CM | POA: Diagnosis not present

## 2021-02-09 DIAGNOSIS — D649 Anemia, unspecified: Secondary | ICD-10-CM | POA: Diagnosis not present

## 2021-02-09 DIAGNOSIS — M19012 Primary osteoarthritis, left shoulder: Secondary | ICD-10-CM | POA: Diagnosis not present

## 2021-02-09 DIAGNOSIS — S0990XA Unspecified injury of head, initial encounter: Secondary | ICD-10-CM | POA: Diagnosis not present

## 2021-02-09 DIAGNOSIS — E871 Hypo-osmolality and hyponatremia: Secondary | ICD-10-CM

## 2021-02-09 DIAGNOSIS — M47812 Spondylosis without myelopathy or radiculopathy, cervical region: Secondary | ICD-10-CM | POA: Diagnosis not present

## 2021-02-09 DIAGNOSIS — R531 Weakness: Secondary | ICD-10-CM

## 2021-02-09 DIAGNOSIS — I25119 Atherosclerotic heart disease of native coronary artery with unspecified angina pectoris: Secondary | ICD-10-CM | POA: Diagnosis not present

## 2021-02-09 DIAGNOSIS — D631 Anemia in chronic kidney disease: Secondary | ICD-10-CM | POA: Diagnosis not present

## 2021-02-09 DIAGNOSIS — I10 Essential (primary) hypertension: Secondary | ICD-10-CM | POA: Diagnosis present

## 2021-02-09 DIAGNOSIS — M79631 Pain in right forearm: Secondary | ICD-10-CM | POA: Diagnosis not present

## 2021-02-09 DIAGNOSIS — J449 Chronic obstructive pulmonary disease, unspecified: Secondary | ICD-10-CM | POA: Diagnosis not present

## 2021-02-09 DIAGNOSIS — U071 COVID-19: Secondary | ICD-10-CM | POA: Insufficient documentation

## 2021-02-09 DIAGNOSIS — R296 Repeated falls: Secondary | ICD-10-CM | POA: Insufficient documentation

## 2021-02-09 DIAGNOSIS — E1122 Type 2 diabetes mellitus with diabetic chronic kidney disease: Secondary | ICD-10-CM | POA: Diagnosis not present

## 2021-02-09 DIAGNOSIS — W19XXXA Unspecified fall, initial encounter: Secondary | ICD-10-CM

## 2021-02-09 DIAGNOSIS — G9341 Metabolic encephalopathy: Secondary | ICD-10-CM | POA: Diagnosis not present

## 2021-02-09 DIAGNOSIS — Z7982 Long term (current) use of aspirin: Secondary | ICD-10-CM | POA: Diagnosis not present

## 2021-02-09 DIAGNOSIS — Z85828 Personal history of other malignant neoplasm of skin: Secondary | ICD-10-CM | POA: Diagnosis not present

## 2021-02-09 DIAGNOSIS — W19XXXD Unspecified fall, subsequent encounter: Secondary | ICD-10-CM

## 2021-02-09 DIAGNOSIS — Z79899 Other long term (current) drug therapy: Secondary | ICD-10-CM | POA: Diagnosis not present

## 2021-02-09 DIAGNOSIS — N1831 Chronic kidney disease, stage 3a: Secondary | ICD-10-CM | POA: Diagnosis not present

## 2021-02-09 DIAGNOSIS — J441 Chronic obstructive pulmonary disease with (acute) exacerbation: Secondary | ICD-10-CM | POA: Diagnosis present

## 2021-02-09 DIAGNOSIS — R4789 Other speech disturbances: Secondary | ICD-10-CM

## 2021-02-09 DIAGNOSIS — F0390 Unspecified dementia without behavioral disturbance: Secondary | ICD-10-CM

## 2021-02-09 DIAGNOSIS — I471 Supraventricular tachycardia: Secondary | ICD-10-CM

## 2021-02-09 DIAGNOSIS — R269 Unspecified abnormalities of gait and mobility: Secondary | ICD-10-CM

## 2021-02-09 DIAGNOSIS — E1121 Type 2 diabetes mellitus with diabetic nephropathy: Secondary | ICD-10-CM

## 2021-02-09 DIAGNOSIS — I2510 Atherosclerotic heart disease of native coronary artery without angina pectoris: Secondary | ICD-10-CM | POA: Diagnosis present

## 2021-02-09 DIAGNOSIS — Z91040 Latex allergy status: Secondary | ICD-10-CM | POA: Diagnosis not present

## 2021-02-09 DIAGNOSIS — M4802 Spinal stenosis, cervical region: Secondary | ICD-10-CM | POA: Diagnosis not present

## 2021-02-09 DIAGNOSIS — E1129 Type 2 diabetes mellitus with other diabetic kidney complication: Secondary | ICD-10-CM | POA: Diagnosis present

## 2021-02-09 DIAGNOSIS — S91012D Laceration without foreign body, left ankle, subsequent encounter: Secondary | ICD-10-CM

## 2021-02-09 DIAGNOSIS — R6889 Other general symptoms and signs: Secondary | ICD-10-CM | POA: Diagnosis not present

## 2021-02-09 DIAGNOSIS — Z043 Encounter for examination and observation following other accident: Secondary | ICD-10-CM | POA: Diagnosis not present

## 2021-02-09 DIAGNOSIS — I480 Paroxysmal atrial fibrillation: Secondary | ICD-10-CM | POA: Insufficient documentation

## 2021-02-09 DIAGNOSIS — R4182 Altered mental status, unspecified: Secondary | ICD-10-CM | POA: Diagnosis present

## 2021-02-09 DIAGNOSIS — Z743 Need for continuous supervision: Secondary | ICD-10-CM | POA: Diagnosis not present

## 2021-02-09 LAB — CBC WITH DIFFERENTIAL/PLATELET
Abs Immature Granulocytes: 0.04 10*3/uL (ref 0.00–0.07)
Basophils Absolute: 0 10*3/uL (ref 0.0–0.1)
Basophils Relative: 0 %
Eosinophils Absolute: 0.1 10*3/uL (ref 0.0–0.5)
Eosinophils Relative: 1 %
HCT: 25.3 % — ABNORMAL LOW (ref 36.0–46.0)
Hemoglobin: 8.5 g/dL — ABNORMAL LOW (ref 12.0–15.0)
Immature Granulocytes: 0 %
Lymphocytes Relative: 31 %
Lymphs Abs: 3.6 10*3/uL (ref 0.7–4.0)
MCH: 29.8 pg (ref 26.0–34.0)
MCHC: 33.6 g/dL (ref 30.0–36.0)
MCV: 88.8 fL (ref 80.0–100.0)
Monocytes Absolute: 1.4 10*3/uL — ABNORMAL HIGH (ref 0.1–1.0)
Monocytes Relative: 12 %
Neutro Abs: 6.6 10*3/uL (ref 1.7–7.7)
Neutrophils Relative %: 56 %
Platelets: 239 10*3/uL (ref 150–400)
RBC: 2.85 MIL/uL — ABNORMAL LOW (ref 3.87–5.11)
RDW: 15.2 % (ref 11.5–15.5)
WBC: 11.7 10*3/uL — ABNORMAL HIGH (ref 4.0–10.5)
nRBC: 0 % (ref 0.0–0.2)

## 2021-02-09 LAB — COMPREHENSIVE METABOLIC PANEL
ALT: 13 U/L (ref 0–44)
AST: 17 U/L (ref 15–41)
Albumin: 3.1 g/dL — ABNORMAL LOW (ref 3.5–5.0)
Alkaline Phosphatase: 63 U/L (ref 38–126)
Anion gap: 10 (ref 5–15)
BUN: 29 mg/dL — ABNORMAL HIGH (ref 8–23)
CO2: 22 mmol/L (ref 22–32)
Calcium: 8 mg/dL — ABNORMAL LOW (ref 8.9–10.3)
Chloride: 98 mmol/L (ref 98–111)
Creatinine, Ser: 1.34 mg/dL — ABNORMAL HIGH (ref 0.44–1.00)
GFR, Estimated: 38 mL/min — ABNORMAL LOW (ref >60)
Glucose, Bld: 173 mg/dL — ABNORMAL HIGH (ref 70–99)
Potassium: 4.2 mmol/L (ref 3.5–5.1)
Sodium: 130 mmol/L — ABNORMAL LOW (ref 135–145)
Total Bilirubin: 0.6 mg/dL (ref 0.3–1.2)
Total Protein: 5.9 g/dL — ABNORMAL LOW (ref 6.5–8.1)

## 2021-02-09 LAB — TROPONIN I (HIGH SENSITIVITY): Troponin I (High Sensitivity): 10 ng/L (ref <18)

## 2021-02-09 MED ORDER — SODIUM CHLORIDE 0.9 % IV SOLN: 100.0000 mg | Freq: Every day | INTRAVENOUS | Status: DC

## 2021-02-09 MED ORDER — SODIUM CHLORIDE 0.9 % IV SOLN: 200.0000 mg | Freq: Once | INTRAVENOUS | Status: DC

## 2021-02-09 MED ORDER — SODIUM CHLORIDE 0.9 % IV SOLN
100.0000 mg | Freq: Once | INTRAVENOUS | Status: AC
  Administered 2021-02-10: 100 mg via INTRAVENOUS
  Filled 2021-02-09: qty 100

## 2021-02-09 MED ORDER — METHYLPREDNISOLONE SODIUM SUCC 125 MG IJ SOLR
1.0000 mg/kg | Freq: Two times a day (BID) | INTRAMUSCULAR | Status: DC
  Filled 2021-02-09: qty 2

## 2021-02-09 MED ORDER — PREDNISONE 20 MG PO TABS: 50.0000 mg | ORAL_TABLET | Freq: Every day | ORAL | Status: DC

## 2021-02-09 NOTE — Telephone Encounter (Signed)
Copied from [place] [phone]. Topic: General - Other >> Feb 09, 2021  2:46 PM Loma Boston wrote: Reason for CRM:pt was covid positive 5/13/ Pt needs suture removal and was moved up to 5/31 due to covid positive on 5/13. Pt provider not available/ Consulted with office and told to make appt  and send CRM to office to notify/ Appt made with Chrismon/ Appt request by case manager Felicia/ if contact is to be made re this reach out to pt daughter Tammy at [phone]

## 2021-02-09 NOTE — ED Triage Notes (Signed)
Pt to ED via EMS from home. Pt states over the past few days she has had multiple mechanical falls, pt was seen last night for fall and has abrasions to right arm and left foot, that required stitches. Pt is on blood thinners denies hitting her head or loc. Pt denies any pain. Pt states she has had an inc in weakness.

## 2021-02-09 NOTE — Chronic Care Management (AMB) (Signed)
  Chronic Care Management      02/09/2021 Name: [name] MRN: [id number] DOB: 09-23-1929  Primary Care Provider: Jerrol Banana., MD    Call received from [name]'s daughter/caregiver Tammy. Reports [name] was treated in the Emergency Department on yesterday after experiencing a fall. The fall resulted in a deep laceration to her left ankle.  She will require a clinic visit to remove the sutures however Tammy reports she recently tested positive for Covid-19.  Discussed provider's recommendation regarding suture removal and clinic visit. Assisted with scheduling a clinic visit on 02/24/21. Tammy is aware that Dr. Rosanna Randy will not be available that day and [name] will be evaluated by another provider in the clinic.   During the call Tammy received a message indicating [name]'s Emergency Alert device alarmed. She was unsure if [name] experienced another fall but agreed to provide an update.    PLAN Anticipate update and outreach later this week.   Cristy [facility] [phone]

## 2021-02-09 NOTE — Telephone Encounter (Signed)
Ok but removal from sutures from ankle is 12-14 days later.

## 2021-02-09 NOTE — Telephone Encounter (Signed)
Returned call to [employer]. Appointment had already been scheduled.

## 2021-02-09 NOTE — ED Provider Notes (Signed)
[facility] Emergency Department Provider Note  ____________________________________________   Event Date/Time   First MD Initiated Contact with Patient 02/09/21 2218     (approximate)  I have reviewed the triage vital signs and the nursing notes.   HISTORY  Chief Complaint Fall and Weakness    HPI [name] is a 87 y.o. female with paroxysmal A. fib not anticoagulated but only on Plavix, coronary disease, diabetes, hypertension, hyperlipidemia, COPD who comes in for a fall.  To note patient did have COVID last week.  Patient has reported a cough.  Patient states that she did not lose consciousness but that she was ambulating out of the bathroom with her walker when her legs got weak and they just gave out.  She states that she just fell onto her butt.  Patient review of records did have an x-ray of her left ankle yesterday that was negative.  Patient reports pain in her left shoulder and a little bit in her right forearm.  She states that she did not hit her head.  According to daughter patient lives by herself and normally is ambulating with a walker but has been more weak over the past few days.  She is also concerned because she was more confused today saying things that do not really make sense.  Patient was recently started on prednisone for the COVID and got 20 mg pill this morning.  No shortness of breath.  Daughter does not feel like she is safe to go home.       Past Medical History:  Diagnosis Date  . Angina pectoris ([place])   . Atrial fibrillation ([place])   . Basal cell carcinoma    nose  . Cervicalgia   . Chronic back pain   . Chronic obstructive pulmonary disease (COPD) ([place])   . COPD (chronic obstructive pulmonary disease) ([place])   . DDD (degenerative disc disease), lumbar   . Diabetes mellitus without complication ([place])   . Dysphagia   . Hyperlipemia   . Hypertension   . Squamous cell carcinoma of skin    left lateral pretibial   . Vulvovaginitis     Patient Active Problem List   Diagnosis Date Noted  . Coronary artery disease involving native coronary artery of native heart 04/17/2020  . Celiac artery stenosis ([place]) 06/12/2019  . Bilateral lower abdominal cramping 04/20/2019  . UTI symptoms 04/20/2019  . History of rectal bleeding 03/26/2019  . DM type 2 with diabetic peripheral neuropathy ([place]) 12/14/2018  . Arthropathy of lumbar facet joint 09/04/2018  . Degeneration of lumbar intervertebral disc 09/04/2018  . Spondylolisthesis, grade 1 09/04/2018  . Lewy body dementia ([place]) 02/17/2018  . PVD (peripheral vascular disease) ([place]) 08/16/2017  . Pain in limb 07/27/2016  . Hallucinations 07/07/2016  . Chest pain 07/01/2016  . Depression 06/23/2016  . Hypokalemia 09/03/2015  . Insomnia 07/23/2015  . Headache 07/23/2015  . Type 2 diabetes mellitus with vascular disease ([place]) 06/24/2015  . Chronic vulvitis 06/02/2015  . Allergic rhinitis 05/30/2015  . Back ache 05/30/2015  . Body mass index (BMI) of 29.0-29.9 in adult 05/30/2015  . Edema 05/30/2015  . Dilatation of esophagus 05/30/2015  . Fall 05/30/2015  . H/O deep venous thrombosis 05/30/2015  . Hypercholesteremia 05/30/2015  . Heart & renal disease, hypertensive, with heart failure ([place]) 05/30/2015  . Below normal amount of sodium in the blood 05/30/2015  . Calculus of kidney 05/30/2015  . Gonalgia 05/30/2015  . Psoriasis 05/30/2015  .  Avitaminosis D 05/30/2015  . Cough 04/01/2015  . Combined fat and carbohydrate induced hyperlipemia 12/30/2014  . Paroxysmal atrial fibrillation ([place]) 07/10/2014  . Abnormal gait 07/08/2014  . Anxiety state 07/08/2014  . Chronic kidney disease 07/08/2014  . Acid reflux 07/08/2014  . 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[place]) 07/08/2014  . Chronic obstructive pulmonary disease ([place]) 07/08/2014  . Type II diabetes mellitus with renal manifestations ([place]) 07/08/2014  . Benign essential HTN 06/26/2014  . Carotid artery  narrowing 06/26/2014  . Myalgia 06/26/2014  . Basal cell carcinoma of face 05/01/2014  . Disease of female genital organs 11/28/2012  . Incomplete bladder emptying 09/05/2012  . Excessive urination at night 08/15/2012  . Basal cell carcinoma of ear 10/26/2011    Past Surgical History:  Procedure Laterality Date  . gall bladder    . melanoma     removal neck and back  . PERIPHERAL VASCULAR CATHETERIZATION Left 01/05/2016   Procedure: Lower Extremity Angiography;  Surgeon: Algernon Huxley, MD;  Location: [facility];  Service: Cardiovascular;  Laterality: Left;  . PERIPHERAL VASCULAR CATHETERIZATION  01/05/2016   Procedure: Lower Extremity Intervention;  Surgeon: Algernon Huxley, MD;  Location: [facility];  Service: Cardiovascular;;  . ureterolithiasis     calculus removed  . VAGINAL HYSTERECTOMY    . VISCERAL ANGIOGRAPHY N/A 05/10/2019   Procedure: VISCERAL ANGIOGRAPHY;  Surgeon: Algernon Huxley, MD;  Location: [facility];  Service: Cardiovascular;  Laterality: N/A;  . VULVA / PERINEUM BIOPSY  05/29/2015    Prior to Admission medications   Medication Sig Start Date End Date Taking? Authorizing Provider  ADVAIR HFA 230-21 MCG/ACT inhaler TAKE 2 PUFFS INTO LUNGS TWICE A DAY 02/04/21   Flora Lipps, MD  albuterol (VENTOLIN HFA) 108 (90 Base) MCG/ACT inhaler INHALE 2 PUFFS INTO THE LUNGS EVERY 4 HOURS AS NEEDED FOR WHEEZING ORSHORTNESS OF BREATH 07/04/19   Flora Lipps, MD  Ascorbic Acid (VITAMIN C) 1000 MG tablet Take 1,000 mg by mouth daily.     [provider]  aspirin 325 MG tablet Take 325 mg by mouth daily. Reported on 01/05/2016    [provider]  atorvastatin (LIPITOR) 10 MG tablet Take 1 tablet (10 mg total) by mouth daily. 04/30/20 04/30/21  Jerrol Banana., MD  cetirizine (ZYRTEC) 10 MG tablet TAKE ONE TABLET BY MOUTH EVERY DAY 12/12/18   Jerrol Banana., MD  cholecalciferol (VITAMIN D3) 25 MCG (1000 UT) tablet Take 1,000 Units by mouth  daily.    [provider]  clopidogrel (PLAVIX) 75 MG tablet TAKE 1 TABLET BY MOUTH DAILY 04/29/20   Jerrol Banana., MD  ezetimibe (ZETIA) 10 MG tablet TAKE 1 TABLET BY MOUTH DAILY 04/29/20   Jerrol Banana., MD  FLUoxetine (PROZAC) 20 MG capsule TAKE 1 CAPSULE EVERY DAY 01/02/21   Flinchum, Kelby Aline, FNP  glucose blood test strip ACCU-CHEK AVIVA PLUS TEST STRP Use to check sugars 3 times daily. 10/28/15   [provider]  insulin glargine (LANTUS) 100 UNIT/ML injection Inject 0.05 mLs (5 Units total) into the skin at bedtime. 07/02/16   Epifanio Lesches, MD  insulin lispro (HUMALOG) 100 UNIT/ML KiwkPen Inject 10 Units into the skin 3 (three) times daily. 10 units before breakfast, 8 before lunch and 18 before supper    [provider]  isosorbide mononitrate (IMDUR) 30 MG 24 hr tablet Take 30 mg by mouth daily. 06/12/20   [provider]  lansoprazole (  PREVACID) 30 MG capsule TAKE 1 CAPSULE BY MOUTH ONCE DAILY AT NOON 12/03/20   Jerrol Banana., MD  losartan (COZAAR) 100 MG tablet TAKE ONE TABLET EVERY DAY 11/12/20   Jerrol Banana., MD  Magnesium 400 MG CAPS Take by mouth daily.    [provider]  meloxicam (MOBIC) 7.5 MG tablet Take 1 tablet (7.5 mg total) by mouth daily as needed for pain. 03/18/20   Jerrol Banana., MD  memantine [facility]) 5 MG tablet TAKE ONE TABLET TWICE DAILY 12/03/20   Jerrol Banana., MD  montelukast (SINGULAIR) 10 MG tablet TAKE ONE TABLET AT BEDTIME 12/03/20   Jerrol Banana., MD  pantoprazole (PROTONIX) 40 MG tablet Take 40 mg by mouth daily.    [provider]  potassium chloride (KLOR-CON) 10 MEQ tablet TAKE 1 TABLET BY MOUTH TWICE DAILY 01/30/21   Jerrol Banana., MD  QUEtiapine (SEROQUEL) 25 MG tablet Take 1 tablet by mouth at bedtime. 03/28/20   [provider]  TIADYLT ER 360 MG 24 hr capsule TAKE 1 CAPSULE EVERY DAY 11/12/20   Jerrol Banana., MD   traZODone (DESYREL) 50 MG tablet TAKE 1 AND 1/2 TABLET AT BEDTIME AS NEEDED FOR SLEEP 10/14/20   Jerrol Banana., MD  TUSSIN DM 100-10 MG/5ML liquid TAKE 1 TEASPOONFUL BY MOUTH EVERY 4 HOURS AS NEEDED FOR COUGH 08/20/19   Flora Lipps, MD  vitamin E 1000 UNIT capsule Take 1,000 Units by mouth daily.    [provider]    Allergies Bacitracin-neomycin-polymyxin, Neomycin-bacitracin zn-polymyx, Latex, Lidocaine, Aricept [donepezil hcl], Benzalkonium chloride, Ibuprofen, Valdecoxib, Albuterol, Tape, and Triamcinolone  Family History  Problem Relation Age of Onset  . Ovarian cancer Other   . Diabetes Sister   . Colon cancer Brother   . Diabetes Brother   . Atrial fibrillation Daughter   . Breast cancer Neg Hx     Social History Social History   Tobacco Use  . Smoking status: Never Smoker  . Smokeless tobacco: Never Used  Vaping Use  . Vaping Use: Never used  Substance Use Topics  . Alcohol use: No  . Drug use: No      Review of Systems Constitutional: No fever/chills, weakness, falls Eyes: No visual changes. ENT: No sore throat. Cardiovascular: Denies chest pain. Respiratory: Denies shortness of breath.  Cough Gastrointestinal: No abdominal pain.  No nausea, no vomiting.  No diarrhea.  No constipation. Genitourinary: Negative for dysuria. Musculoskeletal: Negative for back pain.  Arm pain Skin: Negative for rash. Neurological: Negative for headaches, focal weakness or numbness. All other ROS negative ____________________________________________   PHYSICAL EXAM:  VITAL SIGNS: ED Triage Vitals  Enc Vitals Group     BP 02/09/21 2224 (!) 131/55     Pulse Rate 02/09/21 2224 85     Resp 02/09/21 2224 18     Temp 02/09/21 2224 98.2 F (36.8 C)     Temp Source 02/09/21 2224 Oral     SpO2 02/09/21 2224 98 %     Weight 02/09/21 2222 145 lb 8.1 oz (66 kg)     Height 02/09/21 2222 5\' 2"  (1.575 m)     Head Circumference --      Peak Flow --      Pain  Score 02/09/21 2222 0     Pain Loc --      Pain Edu? --      Excl. in [place]? --  Constitutional: Alert and oriented. Well appearing and in no acute distress. Eyes: Conjunctivae are normal. EOMI. Head: Atraumatic. Nose: No congestion/rhinnorhea. Mouth/Throat: Mucous membranes are moist.   Neck: No stridor. Trachea Midline. FROM Cardiovascular: Normal rate, regular rhythm. Grossly normal heart sounds.  Good peripheral circulation. Respiratory: Normal respiratory effort.  No retractions. Lungs CTAB.  No chest wall pain Gastrointestinal: Soft and nontender. No distention. No abdominal bruits.  No abdominal pain Musculoskeletal: No lower extremity tenderness nor edema.  No joint effusions.  Able to lift both legs up off the bed.  Laceration repaired on the left foot.  Skin tear on the right arm with some tenderness and left shoulder pain. Neurologic:  Normal speech and language. No gross focal neurologic deficits are appreciated.  Skin:  Skin is warm, dry and intact. No rash noted. Psychiatric: Mood and affect are normal. Speech and behavior are normal. GU: Deferred   ____________________________________________   LABS (all labs ordered are listed, but only abnormal results are displayed)  Labs Reviewed  CBC WITH DIFFERENTIAL/PLATELET  COMPREHENSIVE METABOLIC PANEL  URINALYSIS, COMPLETE (UACMP) WITH MICROSCOPIC  TROPONIN I (HIGH SENSITIVITY)   ____________________________________________   ED ECG REPORT I, Vanessa [place], the attending physician, personally viewed and interpreted this ECG.  Normal sinus rate of 74, no ST elevation, no T wave inversions, normal intervals ____________________________________________  RADIOLOGY Robert Bellow, personally viewed and evaluated these images (plain radiographs) as part of my medical decision making, as well as reviewing the written report by the radiologist.  ED MD interpretation: Pending  Official radiology report(s): No results  found.  ____________________________________________   PROCEDURES  Procedure(s) performed (including Critical Care):  .1-3 Lead EKG Interpretation Performed by: Vanessa [place], MD Authorized by: Vanessa [place], MD     Interpretation: normal     ECG rate:  80s   ECG rate assessment: normal     Rhythm: sinus rhythm     Ectopy: none     Conduction: normal       ____________________________________________   INITIAL IMPRESSION / ASSESSMENT AND PLAN / ED COURSE  [name] was evaluated in Emergency Department on 02/09/2021 for the symptoms described in the history of present illness. She was evaluated in the context of the global COVID-19 pandemic, which necessitated consideration that the patient might be at risk for infection with the SARS-CoV-2 virus that causes COVID-19. Institutional protocols and algorithms that pertain to the evaluation of patients at risk for COVID-19 are in a state of rapid change based on information released by regulatory bodies including the CDC and federal and state organizations. These policies and algorithms were followed during the patient's care in the ED.    Patient is on day 5 of COVID coming in now with increasing falls and some confusion noted by family in the setting of being on steroids.  Could related to steroids versus COVID versus electrolyte abnormalities versus intracranial hemorrhage we will get CT scans and labs to evaluate.  We did x-rays to evaluate for fractures.  Patient be handed off to oncoming team pending above work-up but suspect patient will need admission due to weakness and COVID       ____________________________________________   FINAL CLINICAL IMPRESSION(S) / ED DIAGNOSES   Final diagnoses:  Weakness  Fall, initial encounter  COVID-19      MEDICATIONS GIVEN DURING THIS VISIT:  Medications - No data to display   ED Discharge Orders    None  Note:  This document was prepared using Dragon  voice recognition software and may include unintentional dictation errors.   Vanessa [place], MD 02/09/21 [phone]

## 2021-02-09 NOTE — Telephone Encounter (Signed)
Patient was already scheduled. 

## 2021-02-09 NOTE — Telephone Encounter (Signed)
Please advise 

## 2021-02-09 NOTE — Telephone Encounter (Signed)
Copied from [place] [phone]. Topic: Appointment Scheduling - Scheduling Inquiry for Clinic >> Feb 09, 2021  9:25 AM Tessa Lerner A wrote: Reason for CRM: Patient would like to be seen in office for stitch removal  Patient was diagnosed as COVID positive 02/06/21 at [facility]  Please contact patient's daughter to further advise scheduling when possible

## 2021-02-10 DIAGNOSIS — S91012D Laceration without foreign body, left ankle, subsequent encounter: Secondary | ICD-10-CM

## 2021-02-10 DIAGNOSIS — R4789 Other speech disturbances: Secondary | ICD-10-CM

## 2021-02-10 DIAGNOSIS — G9341 Metabolic encephalopathy: Principal | ICD-10-CM

## 2021-02-10 DIAGNOSIS — R4182 Altered mental status, unspecified: Secondary | ICD-10-CM | POA: Diagnosis present

## 2021-02-10 DIAGNOSIS — N1831 Chronic kidney disease, stage 3a: Secondary | ICD-10-CM

## 2021-02-10 DIAGNOSIS — U071 COVID-19: Secondary | ICD-10-CM

## 2021-02-10 DIAGNOSIS — F0390 Unspecified dementia without behavioral disturbance: Secondary | ICD-10-CM

## 2021-02-10 DIAGNOSIS — R296 Repeated falls: Secondary | ICD-10-CM

## 2021-02-10 LAB — URINALYSIS, COMPLETE (UACMP) WITH MICROSCOPIC
Bilirubin Urine: NEGATIVE
Glucose, UA: NEGATIVE mg/dL
Hgb urine dipstick: NEGATIVE
Ketones, ur: NEGATIVE mg/dL
Nitrite: NEGATIVE
Protein, ur: NEGATIVE mg/dL
Specific Gravity, Urine: 1.018 (ref 1.005–1.030)
pH: 5 (ref 5.0–8.0)

## 2021-02-10 LAB — FIBRINOGEN: Fibrinogen: 420 mg/dL (ref 210–475)

## 2021-02-10 LAB — GLUCOSE, CAPILLARY
Glucose-Capillary: 109 mg/dL — ABNORMAL HIGH (ref 70–99)
Glucose-Capillary: 219 mg/dL — ABNORMAL HIGH (ref 70–99)
Glucose-Capillary: 104 mg/dL — ABNORMAL HIGH (ref 70–99)
Glucose-Capillary: 110 mg/dL — ABNORMAL HIGH (ref 70–99)

## 2021-02-10 LAB — C-REACTIVE PROTEIN: CRP: 1.1 mg/dL — ABNORMAL HIGH (ref <1.0)

## 2021-02-10 LAB — FERRITIN: Ferritin: 47 ng/mL (ref 11–307)

## 2021-02-10 LAB — LACTATE DEHYDROGENASE: LDH: 159 U/L (ref 98–192)

## 2021-02-10 LAB — BRAIN NATRIURETIC PEPTIDE: B Natriuretic Peptide: 23.9 pg/mL (ref 0.0–100.0)

## 2021-02-10 LAB — D-DIMER, QUANTITATIVE: D-Dimer, Quant: 1.44 ug/mL-FEU — ABNORMAL HIGH (ref 0.00–0.50)

## 2021-02-10 LAB — CBG MONITORING, ED: Glucose-Capillary: 119 mg/dL — ABNORMAL HIGH (ref 70–99)

## 2021-02-10 LAB — HEPATITIS B SURFACE ANTIGEN: Hepatitis B Surface Ag: NONREACTIVE

## 2021-02-10 LAB — TROPONIN I (HIGH SENSITIVITY): Troponin I (High Sensitivity): 9 ng/L (ref <18)

## 2021-02-10 LAB — PROCALCITONIN: Procalcitonin: <0.1 ng/mL

## 2021-02-10 MED ORDER — LOSARTAN POTASSIUM 100 MG PO TABS: 50.0000 mg | ORAL_TABLET | Freq: Every day | ORAL | Status: DC

## 2021-02-10 MED ORDER — ACETAMINOPHEN 325 MG PO TABS
650.0000 mg | ORAL_TABLET | Freq: Four times a day (QID) | ORAL | Status: DC | PRN
  Filled 2021-02-10: qty 2

## 2021-02-10 MED ORDER — ASCORBIC ACID 500 MG PO TABS
500.0000 mg | ORAL_TABLET | Freq: Every day | ORAL | Status: DC
  Administered 2021-02-10 – 2021-02-12 (×3): 500 mg via ORAL
  Filled 2021-02-10 (×3): qty 1

## 2021-02-10 MED ORDER — TRAZODONE HCL 50 MG PO TABS: 50.0000 mg | ORAL_TABLET | Freq: Every evening | ORAL | Status: DC | PRN

## 2021-02-10 MED ORDER — INSULIN ASPART 100 UNIT/ML IJ SOLN
0.0000 [IU] | Freq: Three times a day (TID) | INTRAMUSCULAR | Status: DC
  Administered 2021-02-10: 5 [IU] via SUBCUTANEOUS
  Administered 2021-02-11 (×2): 3 [IU] via SUBCUTANEOUS
  Administered 2021-02-12: 11 [IU] via SUBCUTANEOUS
  Filled 2021-02-10 (×4): qty 1

## 2021-02-10 MED ORDER — DILTIAZEM HCL ER BEADS 360 MG PO CP24: ORAL_CAPSULE | ORAL | 3 refills | Status: DC

## 2021-02-10 MED ORDER — HYDROCODONE-ACETAMINOPHEN 5-325 MG PO TABS: 1.0000 | ORAL_TABLET | ORAL | Status: DC | PRN

## 2021-02-10 MED ORDER — ENOXAPARIN SODIUM 30 MG/0.3ML IJ SOSY
30.0000 mg | PREFILLED_SYRINGE | INTRAMUSCULAR | Status: DC
  Administered 2021-02-10 – 2021-02-12 (×3): 30 mg via SUBCUTANEOUS
  Filled 2021-02-10 (×3): qty 0.3

## 2021-02-10 MED ORDER — INSULIN ASPART 100 UNIT/ML IJ SOLN
0.0000 [IU] | Freq: Every day | INTRAMUSCULAR | Status: DC
  Administered 2021-02-11: 2 [IU] via SUBCUTANEOUS
  Filled 2021-02-10: qty 1

## 2021-02-10 MED ORDER — ONDANSETRON HCL 4 MG/2ML IJ SOLN: 4.0000 mg | Freq: Four times a day (QID) | INTRAMUSCULAR | Status: DC | PRN

## 2021-02-10 MED ORDER — ONDANSETRON HCL 4 MG PO TABS: 4.0000 mg | ORAL_TABLET | Freq: Four times a day (QID) | ORAL | Status: DC | PRN

## 2021-02-10 MED ORDER — ZINC SULFATE 220 (50 ZN) MG PO CAPS
220.0000 mg | ORAL_CAPSULE | Freq: Every day | ORAL | Status: DC
  Administered 2021-02-10 – 2021-02-12 (×3): 220 mg via ORAL
  Filled 2021-02-10 (×3): qty 1

## 2021-02-10 MED ORDER — ALBUTEROL SULFATE HFA 108 (90 BASE) MCG/ACT IN AERS
1.0000 | INHALATION_SPRAY | Freq: Four times a day (QID) | RESPIRATORY_TRACT | Status: DC | PRN
  Filled 2021-02-10: qty 6.7

## 2021-02-10 NOTE — Discharge Instructions (Signed)
10 Things You Can Do to Manage Your COVID-19 Symptoms at Home °If you have possible or confirmed COVID-19: °1. Stay home except to get medical care. °2. Monitor your symptoms carefully. If your symptoms get worse, call your healthcare provider immediately. °3. Get rest and stay hydrated. °4. If you have a medical appointment, call the healthcare provider ahead of time and tell them that you have or may have COVID-19. °5. For medical emergencies, call 911 and notify the dispatch personnel that you have or may have COVID-19. °6. Cover your cough and sneezes with a tissue or use the inside of your elbow. °7. Wash your hands often with soap and water for at least 20 seconds or clean your hands with an alcohol-based hand sanitizer that contains at least 60% alcohol. °8. As much as possible, stay in a specific room and away from other people in your home. Also, you should use a separate bathroom, if available. If you need to be around other people in or outside of the home, wear a mask. °9. Avoid sharing personal items with other people in your household, like dishes, towels, and bedding. °10. Clean all surfaces that are touched often, like counters, tabletops, and doorknobs. Use household cleaning sprays or wipes according to the label instructions. °cdc.gov/coronavirus °04/11/2020 °This information is not intended to replace advice given to you by your health care provider. Make sure you discuss any questions you have with your health care provider. °Document Revised: 07/28/2020 Document Reviewed: 07/28/2020 °Elsevier Patient Education © 2021 [employer] ° °COVID-19 Quarantine vs. Isolation °QUARANTINE keeps someone who was in close contact with someone who has COVID-19 away from others. °Quarantine if you have been in close contact with someone who has COVID-19, unless you have been fully vaccinated. °If you are fully vaccinated °· You do NOT need to quarantine unless they have symptoms °· Get tested 3-5 days after  your exposure, even if you don't have symptoms °· Wear a mask indoors in public for 14 days following exposure or until your test result is negative °If you are not fully vaccinated °· Stay home for 14 days after your last contact with a person who has COVID-19 °· Watch for fever (100.4°F), cough, shortness of breath, or other symptoms of COVID-19 °· If possible, stay away from people you live with, especially people who are at higher risk for getting very sick from COVID-19 °· Contact your local public health department for options in your area to possibly shorten your quarantine °ISOLATION keeps someone who is sick or tested positive for COVID-19 without symptoms away from others, even in their own home. °People who are in isolation should stay home and stay in a specific "sick room" or area and use a separate bathroom (if available). °If you are sick and think or know you have COVID-19 °Stay home until after °· At least 10 days since symptoms first appeared and °· At least 24 hours with no fever without the use of fever-reducing medications and °· Symptoms have improved °If you tested positive for COVID-19 but do not have symptoms °· Stay home until after 10 days have passed since your positive viral test °· If you develop symptoms after testing positive, follow the steps above for those who are sick °cdc.gov/coronavirus °06/23/2020 °This information is not intended to replace advice given to you by your health care provider. Make sure you discuss any questions you have with your health care provider. °Document Revised: 07/28/2020 Document Reviewed: 07/28/2020 °Elsevier Patient Education ©   2021 [employer]. ° °COVID-19: What to Do if You Are Sick °If you have a fever, cough or other symptoms, you might have COVID-19. Most people have mild illness and are able to recover at home. If you are sick: °· Keep track of your symptoms. °· If you have an emergency warning sign (including trouble breathing), call  911. °Steps to help prevent the spread of COVID-19 if you are sick °If you are sick with COVID-19 or think you might have COVID-19, follow the steps below to care for yourself and to help protect other people in your home and community. °Stay home except to get medical care °· Stay home. Most people with COVID-19 have mild illness and can recover at home without medical care. Do not leave your home, except to get medical care. Do not visit public areas. °· Take care of yourself. Get rest and stay hydrated. Take over-the-counter medicines, such as acetaminophen, to help you feel better. °· Stay in touch with your doctor. Call before you get medical care. Be sure to get care if you have trouble breathing, or have any other emergency warning signs, or if you think it is an emergency. °· Avoid public transportation, ride-sharing, or taxis. °Separate yourself from other people °As much as possible, stay in a specific room and away from other people and pets in your home. If possible, you should use a separate bathroom. If you need to be around other people or animals in or outside of the home, wear a mask. °Tell your close contactsthat they may have been exposed to COVID-19. An infected person can spread COVID-19 starting 48 hours (or 2 days) before the person has any symptoms or tests positive. By letting your close contacts know they may have been exposed to COVID-19, you are helping to protect everyone. °· Additional guidance is available for those living in close quarters and shared housing. °· See COVID-19 and Animals if you have questions about pets. °· If you are diagnosed with COVID-19, someone from the health department may call you. Answer the call to slow the spread. °Monitor your symptoms °· Symptoms of COVID-19 include fever, cough, or other symptoms. °· Follow care instructions from your healthcare provider and local health department. Your local health authorities may give instructions on checking your  symptoms and reporting information. °When to seek emergency medical attention °Look for emergency warning signs* for COVID-19. If someone is showing any of these signs, seek emergency medical care immediately: °· Trouble breathing °· Persistent pain or pressure in the chest °· New confusion °· Inability to wake or stay awake °· Pale, gray, or blue-colored skin, lips, or nail beds, depending on skin tone °*This list is not all possible symptoms. Please call your medical provider for any other symptoms that are severe or concerning to you. °Call 911 or call ahead to your local emergency facility: Notify the operator that you are seeking care for someone who has or may have COVID-19. °Call ahead before visiting your doctor °· Call ahead. Many medical visits for routine care are being postponed or done by phone or telemedicine. °· If you have a medical appointment that cannot be postponed, call your doctor's office, and tell them you have or may have COVID-19. This will help the office protect themselves and other patients. °Get  °tested °· If you have symptoms of COVID-19, get tested. While waiting for test results, you stay away from others, including staying apart from those living in your household. °· You   can visit your state, tribal, local, and territorialhealth department's website to look for the latest local information on testing sites. °If you are sick, wear a mask over your nose and mouth °· You should wear a mask over your nose and mouth if you must be around other people or animals, including pets (even at home). °· You don't need to wear the mask if you are alone. If you can't put on a mask (because of trouble breathing, for example), cover your coughs and sneezes in some other way. Try to stay at least 6 feet away from other people. This will help protect the people around you. °· Masks should not be placed on young children under age 2 years, anyone who has trouble breathing, or anyone who is not able  to remove the mask without help. °Note: During the COVID-19 pandemic, medical grade facemasks are reserved for healthcare workers and some first responders. °Cover your coughs and sneezes °· Cover your mouth and nose with a tissue when you cough or sneeze. °· Throw away used tissues in a lined trash can. °· Immediately wash your hands with soap and water for at least 20 seconds. If soap and water are not available, clean your hands with an alcohol-based hand sanitizer that contains at least 60% alcohol. °Clean your hands often °· Wash your hands often with soap and water for at least 20 seconds. This is especially important after blowing your nose, coughing, or sneezing; going to the bathroom; and before eating or preparing food. °· Use hand sanitizer if soap and water are not available. Use an alcohol-based hand sanitizer with at least 60% alcohol, covering all surfaces of your hands and rubbing them together until they feel dry. °· Soap and water are the best option, especially if hands are visibly dirty. °· Avoid touching your eyes, nose, and mouth with unwashed hands. °· Handwashing Tips °Avoid sharing personal household items °· Do not share dishes, drinking glasses, cups, eating utensils, towels, or bedding with other people in your home. °· Wash these items thoroughly after using them with soap and water or put in the dishwasher. °Clean all "high-touch" surfaces everyday °· Clean and disinfect high-touch surfaces in your "sick room" and bathroom; wear disposable gloves. Let someone else clean and disinfect surfaces in common areas, but you should clean your bedroom and bathroom, if possible. °· If a caregiver or other person needs to clean and disinfect a sick person's bedroom or bathroom, they should do so on an as-needed basis. The caregiver/other person should wear a mask and disposable gloves prior to cleaning. They should wait as long as possible after the person who is sick has used the bathroom before  coming in to clean and use the bathroom. °? High-touch surfaces include phones, remote controls, counters, tabletops, doorknobs, bathroom fixtures, toilets, keyboards, tablets, and bedside tables. °· Clean and disinfect areas that may have blood, stool, or body fluids on them. °· Use household cleaners and disinfectants. Clean the area or item with soap and water or another detergent if it is dirty. Then, use a household disinfectant. °? Be sure to follow the instructions on the label to ensure safe and effective use of the product. Many products recommend keeping the surface wet for several minutes to ensure germs are killed. Many also recommend precautions such as wearing gloves and making sure you have good ventilation during use of the product. °? Use a product from EPA's List N: Disinfectants for Coronavirus (COVID-19). °? Complete Disinfection   Guidance °When you can be around others after being sick with COVID-19 °Deciding when you can be around others is different for different situations. Find out when you can safely end home isolation. °For any additional questions about your care, contact your healthcare provider or state or local health department. °12/12/2019 °Content source: National Center for Immunization and Respiratory Diseases (NCIRD), Division of Viral Diseases °This information is not intended to replace advice given to you by your health care provider. Make sure you discuss any questions you have with your health care provider. °Document Revised: 07/28/2020 Document Reviewed: 07/28/2020 °Elsevier Patient Education © 2021 [employer] Inc. ° ° °

## 2021-02-10 NOTE — Progress Notes (Signed)
Patient not safe to discharge at this time. Daughter concerned about pt's wellbeing and would like for pt to d/ced to SNF.  PT refused discharge order. Per pt, she does not fee safe discharging home by herself. Pt unsteady getting OOB to [facility].  MD notified.

## 2021-02-10 NOTE — Evaluation (Signed)
Physical Therapy Evaluation Patient Details Name: [name] MRN: [id number] DOB: [birth year] Today's Date: 02/10/2021   History of Present Illness  Pt is an 87 y.o. female with medical history significant for Paroxysmal A. fib not on anticoagulation, COPD with chronic cough, diabetes, COPD, CKD stage llla, chronic anemia, dementia with history of frequent falls, recently diagnosed with COVID on 5/13 after becoming symptomatic for cough and weakness, started on prednisone on 5/15 by her PCP, and who presented to the emergency room for fall on 5/15 sustaining a laceration to her left ankle requiring sutures, who presents to the emergency room again on 5/16 following another fall at home.  On this occasion her daughter noticed that she had intermittent garbled speech and was concerned that it could be either a stroke or a side effect of having started prednisone.  MD assessment includes: Acute metabolic encephalopathy in the setting of dementia, CT of head negative, frequent falls, Covid-19, left ankle laceration, and acute on chronic anemia.    Clinical Impression  Pt was pleasant and motivated to participate during the session.  Pt required extra time and effort with both bed mobility tasks and sit to stand transfers but did not require physical assist.  Pt was moderately antalgic on the L ankle initially during gait but improved grossly as session progressed demonstrated by gradually increasing LLE stance time and increased RLE step length.  Pt will benefit from HHPT upon discharge to safely address deficits listed in patient problem list for decreased caregiver assistance and eventual return to PLOF. Pt would also benefit from supervision with OOB/mobility secondary to resent history of falls.          Follow Up Recommendations Home health PT;Supervision for mobility/OOB    Equipment Recommendations  None recommended by PT    Recommendations for Other Services       Precautions /  Restrictions Precautions Precautions: Fall Restrictions Weight Bearing Restrictions: No      Mobility  Bed Mobility Overal bed mobility: Modified Independent             General bed mobility comments: Extra time and effort only    Transfers Overall transfer level: Needs assistance Equipment used: Rolling walker (2 wheeled) Transfers: Sit to/from Stand Sit to Stand: Min guard;From elevated surface         General transfer comment: Pt required extra time and effort to come to standing from an elevated surface  Ambulation/Gait Ambulation/Gait assistance: Min guard Gait Distance (Feet): 40 Feet Assistive device: Rolling walker (2 wheeled) Gait Pattern/deviations: Step-through pattern;Antalgic;Decreased stance time - left;Decreased step length - right Gait velocity: decreased   General Gait Details: Antalgic on the LLE but improved grossly as gait progressed; mod verbal cues for amb closer to the [employer] Mobility    Modified Rankin (Stroke Patients Only)       Balance Overall balance assessment: Needs assistance;History of Falls   Sitting balance-Leahy Scale: Good     Standing balance support: Bilateral upper extremity supported;During functional activity Standing balance-Leahy Scale: Fair Standing balance comment: Min to mod lean on the RW for support                             Pertinent Vitals/Pain Pain Assessment: 0-10 Pain Location: No pain at rest, 6/10 L ankle pain with WB Pain Descriptors / Indicators: Sore;Aching Pain Intervention(s): Premedicated before session;Monitored  during session    [place] expects to be discharged to:: Private residence Living Arrangements: Alone Available Help at Discharge: Family;Available PRN/intermittently Type of Home: House Home Access: Stairs to enter Entrance Stairs-Rails: Right;Can reach both;Left Entrance Stairs-Number of Steps: 2 Home Layout:  One level Home Equipment: Bedside commode;Walker - 2 wheels;Walker - 4 wheels;Toilet riser;Grab bars - tub/shower Additional Comments: Pt has two daughters and a son and between them all someone checks on pt daily    Prior Function Level of Independence: Needs assistance   Gait / Transfers Assistance Needed: Mod Ind amb limited community distances with a rollator, 3 falls in the last 6 months secondary to "slipping" and "legs give way"  ADL's / Homemaking Assistance Needed: Ind with bathing and dressing, family assists with meals, meds, and transportation        Hand Dominance        Extremity/Trunk Assessment   Upper Extremity Assessment Upper Extremity Assessment: Generalized weakness    Lower Extremity Assessment Lower Extremity Assessment: Generalized weakness;LLE deficits/detail LLE Deficits / Details: unable to assess L ankle secondary to pain       Communication   Communication: HOH  Cognition Arousal/Alertness: Awake/alert Behavior During Therapy: WFL for tasks assessed/performed Overall Cognitive Status: Within Functional Limits for tasks assessed                                        General Comments      Exercises Total Joint Exercises Ankle Circles/Pumps: AROM;Strengthening;Right;10 reps Quad Sets: Strengthening;Both;10 reps Gluteal Sets: Strengthening;Both;10 reps Towel Squeeze: Strengthening;Both;10 reps Hip ABduction/ADduction: Strengthening;Both;5 reps Straight Leg Raises: Both;5 reps;Strengthening Long Arc Quad: Strengthening;Both;15 reps Knee Flexion: 15 reps;Both;Strengthening   Assessment/Plan    PT Assessment Patient needs continued PT services  PT Problem List Decreased strength;Decreased activity tolerance;Decreased balance;Decreased mobility;Decreased knowledge of use of DME       PT Treatment Interventions DME instruction;Gait training;Stair training;Functional mobility training;Therapeutic activities;Therapeutic  exercise;Balance training;Patient/family education    PT Goals (Current goals can be found in the Care Plan section)  Acute Rehab PT Goals Patient Stated Goal: To walk better PT Goal Formulation: With patient Time For Goal Achievement: 02/23/21 Potential to Achieve Goals: Good    Frequency Min 2X/week   Barriers to discharge        Co-evaluation               AM-PAC PT "6 Clicks" Mobility  Outcome Measure Help needed turning from your back to your side while in a flat bed without using bedrails?: A Little Help needed moving from lying on your back to sitting on the side of a flat bed without using bedrails?: A Little Help needed moving to and from a bed to a chair (including a wheelchair)?: A Little Help needed standing up from a chair using your arms (e.g., wheelchair or bedside chair)?: A Little Help needed to walk in hospital room?: A Little Help needed climbing 3-5 steps with a railing? : A Little 6 Click Score: 18    End of Session Equipment Utilized During Treatment: Gait belt Activity Tolerance: Patient tolerated treatment well Patient left: in chair;with call bell/phone within reach;with chair alarm set Nurse Communication: Mobility status PT Visit Diagnosis: History of falling (Z91.81);Unsteadiness on feet (R26.81);Repeated falls (R29.6);Difficulty in walking, not elsewhere classified (R26.2);Pain;Muscle weakness (generalized) (M62.81) Pain - Right/Left: Left Pain - part of body: Ankle and joints of  foot    Time: 1322-1410 PT Time Calculation (min) (ACUTE ONLY): 48 min   Charges:   PT Evaluation $PT Eval Moderate Complexity: 1 Mod PT Treatments $Therapeutic Exercise: 8-22 mins        D. Royetta Asal PT, DPT 02/10/21, 2:34 PM

## 2021-02-10 NOTE — Plan of Care (Signed)

## 2021-02-10 NOTE — Plan of Care (Signed)
To remain free from infection during hospital stay

## 2021-02-10 NOTE — Discharge Summary (Signed)
Physician Discharge Summary   [name]  female DOB: 09-08-1929  ML:[id number]  PCP: Jerrol Banana., MD  Admit date: 02/09/2021 Discharge date: 02/10/2021  Admitted From: home Disposition:  Home with [facility] PT/OT/RN.  Pt did not leave on the day of discharge.  Pt was initially agreeable to going home, then daughters wanted pt to go to SNF rehab and didn't come pick up pt, then later reportedly pt refused discharge order.  Procedure to file for appeal of the discharge was not initiated.  Pt's RN registered her concern and opinion that "Patient not safe to discharge at this time".  Nursing leadership and TOC to decide on pt's disposition.  CODE STATUS: DNR  Discharge Instructions    Discharge instructions   Complete by: As directed    You blood pressure has been low normal to normal in the hospital without your home blood pressure medications, so please hold your Diltizem and half your Losartan to 50 mg daily and follow up with your primary care doctor for further adjustment.  You don't have any more symptoms from your prior COVID infection, so you don't need more treatment for it.  Physical therapy worked with you and cleared for you to go home.  We will order home health physical therapy for you.  Please always walk with a walker.   Dr. Enzo Bi - -   Discharge wound care:   Complete by: As directed    Per prior home dressing instruction.       Hospital Course:  For full details, please see H&P, progress notes, consult notes and ancillary notes.  Briefly,  [name] is a 87 y.o. female with medical history significant for Paroxysmal A. fib not on anticoagulation, COPD with chronic cough, diabetes, COPD, CKD stage llla, chronic anemia, dementia with history of frequent falls, who presented to the emergency room for fall on 5/15 sustaining a laceration to her left ankle requiring sutures, who presented to the emergency room again on 5/16 following  another fall at home.  On this occasion her daughter noticed that she had intermittent garbled speech and was concerned that it could be either a stroke or a side effect of having started prednisone.  She states patient has hallucinated with prednisone in the past.  She states that her mom was actually improving from [place] and her cough is at the baseline for her COPD but she continues to be extremely weak.  At baseline patient lives alone and is able to function with family members checking in on her.  Frequent falls --PT evaluated pt and did not recommend SNF rehab, and cleared for pt to go home with HHPT.  Pt admitted to not being compliant with using walker, and pt was encourage to use her walker at all times.  Pt said she lives alone, but has multiple family members nearby to help her, and pt was agreeable to going back to her previous home environment.  HH PT/OT/RN for extra support. --Daughter [name] had concerns about pt going home since family members can not help take care of pt right now.   Daughter wanted pt to be discharged to SNF rehab.  I discussed with [name] on the phone and explained that we can not get pt accepted to SNF rehab unless PT recommends the need.    Garbled speech, resolved AMS Dementia [facility]) - daughter reported Patient had transient garbled speech but CT head negative and exam is nonfocal.  Symptoms may be  due to steroid use, which was d/c'ed. --Pt was responding appropriately and speech normal on the day of discharge.    Laceration left ankle status post suturing, secondary to fall on 5/15 --pt reported daughter changes her dressing.    COVID-19 virus infection, positive on 02/06/2021 - no respiratory symptoms, no hypoxia, and chest x-ray clear.  Pt also tested positive back on 06/18/20. --no need for Remedisir. --No need for steroid.     Anemia, acute on chronic  - Hemoglobin 8.5 down from baseline of 10 - Possibly related to blood loss from ankle laceration on  8/15, in the setting of taking plavix, however, currently no bleeding seen.  Essential HTN --Home BP meds held on presentation, and BP has been low normal to normal.  At discharge, home Diltizem held, home Losartan decreased from 100 to 50 mg daily, and Imdur continued.    Paroxysmal atrial fibrillation (HCC) --not on anticoagulation at home.    Chronic obstructive pulmonary disease (HCC) - Not acutely exacerbated - Continue home inhalers with duo nebs as needed    Type II diabetes mellitus with renal manifestations (HCC) A1c 7.6.  Discharged on home insulin regimen.    Coronary artery disease involving native coronary artery of native heart - Continue clopidogrel and atorvastatin.      Chronic kidney disease, stage 3a ([place]) - Renal function at baseline   Discharge Diagnoses:  Principal Problem:   Acute metabolic encephalopathy Active Problems:   Anemia   Benign essential HTN   Paroxysmal atrial fibrillation (HCC)   Chronic obstructive pulmonary disease (HCC)   Type II diabetes mellitus with renal manifestations (HCC)   Coronary artery disease involving native coronary artery of native heart   Frequent falls   COVID-19 virus infection   Chronic kidney disease, stage 3a (HCC)   Dementia (HCC)   Garbled speech, intermittent   Laceration without foreign body, left ankle, subsequent encounter   AMS (altered mental status)   30 Day Unplanned Readmission Risk Score   Flowsheet Row ED to Hosp-Admission (Current) from 02/09/2021 in [place] (1A)  30 Day Unplanned Readmission Risk Score (%) 28.77 Filed at 02/10/2021 1200     This score is the patient's risk of an unplanned readmission within 30 days of being discharged (0 -100%). The score is based on dignosis, age, lab data, medications, orders, and past utilization.   Low:  0-14.9   Medium: 15-21.9   High: 22-29.9   Extreme: 30 and above        Discharge Instructions:  Allergies  as of 02/10/2021      Reactions   Bacitracin-neomycin-polymyxin Rash   Neomycin-bacitracin Zn-polymyx Swelling, Rash   Latex Rash   Lidocaine Rash   Aricept [donepezil Hcl] Diarrhea   Benzalkonium Chloride Itching, Swelling   Ibuprofen    Other reaction(s): Dizziness Heart fluttering. Tachycardia. Tachycardia.   Valdecoxib Nausea And Vomiting   Albuterol Rash   Tape Rash   blisters   Triamcinolone Rash      Medication List    STOP taking these medications   doxycycline 100 MG tablet Commonly known as: VIBRA-TABS   potassium chloride 10 MEQ tablet Commonly known as: KLOR-CON   predniSONE 20 MG tablet Commonly known as: DELTASONE   vitamin C 1000 MG tablet     TAKE these medications   Advair HFA 230-21 MCG/ACT inhaler Generic drug: fluticasone-salmeterol TAKE 2 PUFFS INTO LUNGS TWICE A DAY   albuterol 108 (90 Base) MCG/ACT inhaler Commonly known as:  VENTOLIN HFA INHALE 2 PUFFS INTO THE LUNGS EVERY 4 HOURS AS NEEDED FOR WHEEZING ORSHORTNESS OF BREATH   aspirin 325 MG tablet Take 325 mg by mouth daily. Reported on 01/05/2016   atorvastatin 10 MG tablet Commonly known as: Lipitor Take 1 tablet (10 mg total) by mouth daily.   benzonatate 200 MG capsule Commonly known as: TESSALON Take 200 mg by mouth 3 (three) times daily as needed for cough. For up to 7 days.   cetirizine 10 MG tablet Commonly known as: ZYRTEC TAKE ONE TABLET BY MOUTH EVERY DAY   cholecalciferol 25 MCG (1000 UNIT) tablet Commonly known as: VITAMIN D3 Take 1,000 Units by mouth daily.   clopidogrel 75 MG tablet Commonly known as: PLAVIX TAKE 1 TABLET BY MOUTH DAILY   diltiazem 360 MG 24 hr capsule Commonly known as: Tiadylt ER Hold until followup with your outpatient doctor due to your blood pressure on the low side. What changed:   how much to take  how to take this  when to take this  additional instructions   ezetimibe 10 MG tablet Commonly known as: ZETIA TAKE 1 TABLET BY  MOUTH DAILY   FLUoxetine 20 MG capsule Commonly known as: PROZAC TAKE 1 CAPSULE EVERY DAY   glucose blood test strip ACCU-CHEK AVIVA PLUS TEST STRP Use to check sugars 3 times daily.   insulin glargine 100 UNIT/ML injection Commonly known as: Lantus Inject 0.05 mLs (5 Units total) into the skin at bedtime. What changed: Another medication with the same name was removed. Continue taking this medication, and follow the directions you see here.   insulin lispro 100 UNIT/ML KiwkPen Commonly known as: HUMALOG Inject 10 Units into the skin 3 (three) times daily. 10 units before breakfast, 8 before lunch and 18 before supper   isosorbide mononitrate 30 MG 24 hr tablet Commonly known as: IMDUR Take 30 mg by mouth daily.   lansoprazole 30 MG capsule Commonly known as: PREVACID TAKE 1 CAPSULE BY MOUTH ONCE DAILY AT NOON   losartan 100 MG tablet Commonly known as: COZAAR Take 0.5 tablets (50 mg total) by mouth daily. This is a decrease from 100 mg daily. What changed:   how much to take  additional instructions   Magnesium 400 MG Caps Take by mouth daily.   meloxicam 7.5 MG tablet Commonly known as: MOBIC Take 1 tablet (7.5 mg total) by mouth daily as needed for pain.   memantine 5 MG tablet Commonly known as: NAMENDA TAKE ONE TABLET TWICE DAILY   montelukast 10 MG tablet Commonly known as: SINGULAIR TAKE ONE TABLET AT BEDTIME   ondansetron 4 MG disintegrating tablet Commonly known as: ZOFRAN-ODT Take 4 mg by mouth every 8 (eight) hours as needed for nausea. For up to 12 doses.   pantoprazole 40 MG tablet Commonly known as: PROTONIX Take 40 mg by mouth daily.   QUEtiapine 25 MG tablet Commonly known as: SEROQUEL Take 1 tablet by mouth at bedtime.   traZODone 50 MG tablet Commonly known as: DESYREL TAKE 1 AND 1/2 TABLET AT BEDTIME AS NEEDED FOR SLEEP   Tussin DM 10-100 MG/5ML liquid Generic drug: Dextromethorphan-guaiFENesin TAKE 1 TEASPOONFUL BY MOUTH EVERY 4  HOURS AS NEEDED FOR COUGH   vitamin E 1000 UNIT capsule Take 1,000 Units by mouth daily.            Discharge Care Instructions  (From admission, onward)         Start     Ordered   02/10/21  0000  Discharge wound care:       Comments: Per prior home dressing instruction.   02/10/21 1420           Follow-up Information    Jerrol Banana., MD. Schedule an appointment as soon as possible for a visit in 1 week(s).   Specialty: Family Medicine Contact information: [address] [place] [address] [phone]               Allergies  Allergen Reactions  . Bacitracin-Neomycin-Polymyxin Rash  . Neomycin-Bacitracin Zn-Polymyx Swelling and Rash  . Latex Rash  . Lidocaine Rash  . Aricept [Donepezil Hcl] Diarrhea  . Benzalkonium Chloride Itching and Swelling  . Ibuprofen     Other reaction(s): Dizziness Heart fluttering. Tachycardia. Tachycardia.  . Valdecoxib Nausea And Vomiting  . Albuterol Rash  . Tape Rash    blisters  . Triamcinolone Rash     The results of significant diagnostics from this hospitalization (including imaging, microbiology, ancillary and laboratory) are listed below for reference.   Consultations:   Procedures/Studies: DG Forearm Right  Result Date: 02/09/2021 CLINICAL DATA:  Recent fall with forearm pain, initial encounter EXAM: RIGHT FOREARM - 2 VIEW COMPARISON:  None. FINDINGS: No acute fracture in the radius or ulna is seen. Atherosclerotic calcifications are noted in the vasculature. Degenerative changes about wrist joint are seen. IMPRESSION: No acute abnormality noted. Electronically Signed   By: Inez Catalina M.D.   On: 02/09/2021 23:42   DG Ankle Complete Left  Result Date: 02/08/2021 CLINICAL DATA:  87 year old who fell and sustained a laceration to the lateral LEFT ankle on glass. Initial encounter. EXAM: LEFT ANKLE COMPLETE - 3+ VIEW COMPARISON:  No prior LEFT ankle images. LEFT foot x-rays 02/13/2018  are correlated. FINDINGS: Examination was performed with bandage material in place. No opaque foreign bodies in the soft tissues to suggest a glass shard. No evidence of acute fracture. Tibiotalar joint anatomically aligned with a well-preserved joint space. Osseous demineralization. No visible joint effusion. Small plantar calcaneal spur. IMPRESSION: 1. No acute osseous abnormality. 2. No opaque foreign body. 3. Osseous demineralization. Electronically Signed   By: Evangeline Dakin M.D.   On: 02/08/2021 13:48   CT Head Wo Contrast  Result Date: 02/09/2021 CLINICAL DATA:  Fall EXAM: CT HEAD WITHOUT CONTRAST CT CERVICAL SPINE WITHOUT CONTRAST TECHNIQUE: Multidetector CT imaging of the head and cervical spine was performed following the standard protocol without intravenous contrast. Multiplanar CT image reconstructions of the cervical spine were also generated. COMPARISON:  None. FINDINGS: CT HEAD FINDINGS Brain: No acute territorial infarction, hemorrhage or intracranial mass. Mild atrophy. Patchy white matter hypodensity consistent with chronic small vessel ischemic change. Vascular: No hyperdense vessels.  Carotid vascular calcification Skull: Normal. Negative for fracture or focal lesion. Sinuses/Orbits: No acute finding. Other: None CT CERVICAL SPINE FINDINGS Alignment: Trace anterolisthesis C3 on C4 and C4 on C5 likely degenerative. Facet alignment is maintained Skull base and vertebrae: No acute fracture. No primary bone lesion or focal pathologic process. Soft tissues and spinal canal: No prevertebral fluid or swelling. No visible canal hematoma. Disc levels: Moderate disc space narrowing and degenerative change C5-C6. Facet degenerative changes at multiple levels with foraminal narrowing. Upper chest: Negative. Other: None IMPRESSION: 1. No CT evidence for acute intracranial abnormality. Atrophy and chronic small vessel ischemic change of the white matter 2. Degenerative changes. Trace anterolisthesis C3  on C4 and C4 on C5 likely due to degenerative change. No acute osseous abnormality. Electronically  Signed   By: Donavan Foil M.D.   On: 02/09/2021 23:26   CT Cervical Spine Wo Contrast  Result Date: 02/09/2021 CLINICAL DATA:  Fall EXAM: CT HEAD WITHOUT CONTRAST CT CERVICAL SPINE WITHOUT CONTRAST TECHNIQUE: Multidetector CT imaging of the head and cervical spine was performed following the standard protocol without intravenous contrast. Multiplanar CT image reconstructions of the cervical spine were also generated. COMPARISON:  None. FINDINGS: CT HEAD FINDINGS Brain: No acute territorial infarction, hemorrhage or intracranial mass. Mild atrophy. Patchy white matter hypodensity consistent with chronic small vessel ischemic change. Vascular: No hyperdense vessels.  Carotid vascular calcification Skull: Normal. Negative for fracture or focal lesion. Sinuses/Orbits: No acute finding. Other: None CT CERVICAL SPINE FINDINGS Alignment: Trace anterolisthesis C3 on C4 and C4 on C5 likely degenerative. Facet alignment is maintained Skull base and vertebrae: No acute fracture. No primary bone lesion or focal pathologic process. Soft tissues and spinal canal: No prevertebral fluid or swelling. No visible canal hematoma. Disc levels: Moderate disc space narrowing and degenerative change C5-C6. Facet degenerative changes at multiple levels with foraminal narrowing. Upper chest: Negative. Other: None IMPRESSION: 1. No CT evidence for acute intracranial abnormality. Atrophy and chronic small vessel ischemic change of the white matter 2. Degenerative changes. Trace anterolisthesis C3 on C4 and C4 on C5 likely due to degenerative change. No acute osseous abnormality. Electronically Signed   By: Donavan Foil M.D.   On: 02/09/2021 23:26   DG Chest Portable 1 View  Result Date: 02/09/2021 CLINICAL DATA:  Recent fall with chest pain, initial encounter EXAM: PORTABLE CHEST 1 VIEW COMPARISON:  07/24/2020 FINDINGS: Cardiac shadow  is within normal limits. Aortic calcifications are again noted. Lungs are clear. No acute bony abnormality is seen. IMPRESSION: No acute abnormality noted. Electronically Signed   By: Inez Catalina M.D.   On: 02/09/2021 23:43   DG Shoulder Left  Result Date: 02/09/2021 CLINICAL DATA:  Recent fall with shoulder pain, initial encounter EXAM: LEFT SHOULDER - 2+ VIEW COMPARISON:  10/16/2020 FINDINGS: Degenerative changes of the acromioclavicular joint are noted. No acute fracture or dislocation is noted. No soft tissue abnormality is seen. IMPRESSION: No acute abnormality noted.  Degenerative changes are seen. Electronically Signed   By: Inez Catalina M.D.   On: 02/09/2021 23:41      Labs: BNP (last 3 results) Recent Labs    03/30/20 2345 02/09/21 2354  BNP 93.6 25.4   Basic Metabolic Panel: Recent Labs  Lab 02/08/21 1200 02/09/21 2225  NA 131* 130*  K 3.7 4.2  CL 99 98  CO2 22 22  GLUCOSE 325* 173*  BUN 22 29*  CREATININE 1.06* 1.34*  CALCIUM 7.9* 8.0*   Liver Function Tests: Recent Labs  Lab 02/08/21 1200 02/09/21 2225  AST 19 17  ALT 14 13  ALKPHOS 65 63  BILITOT 0.5 0.6  PROT 5.5* 5.9*  ALBUMIN 2.9* 3.1*   No results for input(s): LIPASE, AMYLASE in the last 168 hours. No results for input(s): AMMONIA in the last 168 hours. CBC: Recent Labs  Lab 02/08/21 1200 02/09/21 2225  WBC 7.3 11.7*  NEUTROABS 3.8 6.6  HGB 10.2* 8.5*  HCT 29.9* 25.3*  MCV 88.2 88.8  PLT 215 239   Cardiac Enzymes: No results for input(s): CKTOTAL, CKMB, CKMBINDEX, TROPONINI in the last 168 hours. BNP: Invalid input(s): POCBNP CBG: Recent Labs  Lab 02/08/21 1259 02/08/21 1403 02/10/21 0145 02/10/21 0748 02/10/21 1255  GLUCAP 295* 221* 119* 109* 219*   D-Dimer Recent Labs  02/09/21 2354  DDIMER 1.44*   Hgb A1c Recent Labs    02/08/21 1200  HGBA1C 7.6*   Lipid Profile No results for input(s): CHOL, HDL, LDLCALC, TRIG, CHOLHDL, LDLDIRECT in the last 72  hours. Thyroid function studies No results for input(s): TSH, T4TOTAL, T3FREE, THYROIDAB in the last 72 hours.  Invalid input(s): FREET3 Anemia work up Recent Labs    02/09/21 2354  FERRITIN 47   Urinalysis    Component Value Date/Time   COLORURINE YELLOW (A) 02/10/2021 1049   APPEARANCEUR CLEAR (A) 02/10/2021 1049   APPEARANCEUR Clear 01/23/2015 1444   LABSPEC 1.018 02/10/2021 1049   LABSPEC 1.013 01/23/2015 1444   PHURINE 5.0 02/10/2021 1049   GLUCOSEU NEGATIVE 02/10/2021 1049   GLUCOSEU Negative 01/23/2015 1444   HGBUR NEGATIVE 02/10/2021 1049   BILIRUBINUR NEGATIVE 02/10/2021 1049   BILIRUBINUR negative 06/20/2019 1429   BILIRUBINUR Negative 01/23/2015 1444   KETONESUR NEGATIVE 02/10/2021 1049   PROTEINUR NEGATIVE 02/10/2021 1049   UROBILINOGEN 0.2 06/20/2019 1429   NITRITE NEGATIVE 02/10/2021 1049   LEUKOCYTESUR TRACE (A) 02/10/2021 1049   LEUKOCYTESUR Negative 01/23/2015 1444   Sepsis Labs Invalid input(s): PROCALCITONIN,  WBC,  LACTICIDVEN Microbiology No results found for this or any previous visit (from the past 240 hour(s)).   Total time spend on discharging this patient, including the last patient exam, discussing the hospital stay, instructions for ongoing care as it relates to all pertinent caregivers, as well as preparing the medical discharge records, prescriptions, and/or referrals as applicable, is 250 minutes.    Enzo Bi, MD  [facility] 02/10/2021, 2:20 PM

## 2021-02-10 NOTE — Progress Notes (Signed)
Spoke to the patient on the phone due to Covid positive, The patient is agreeable to [facility], I notified [facility] of the patient request for services and [place] accepted, I notified the patient, She has DME at home.  I explained the code 33 and she gave my permission to sign the form on her behalf since she is Covid positive, she stated that her adult children will be helping check  on her but she lives alone.

## 2021-02-10 NOTE — Progress Notes (Signed)
Patients daughter was unable to give a medication list at this time.  Daughter will call in the morning when she wakes up to give a full  List of medications.

## 2021-02-10 NOTE — Progress Notes (Signed)
Patient came to the floor at 344 via stretcher with mask in place.  Daughter came up with patient.  Nurse noticed on vitals that respirations were 27.  Tech counted for 1 minute 16 were charted.

## 2021-02-10 NOTE — Progress Notes (Signed)
Remdesivir - Pharmacy Brief Note   O:  Pt testing positive for COVID last week, taking prednisone. CXR: Lungs are clear. No acute abnormality noted. SpO2: 98% on RA   A/P:  Remdesivir 200 mg IVPB once followed by 100 mg IVPB daily x 4 days.   Renda Rolls, PharmD, [facility] 02/10/2021 12:52 AM

## 2021-02-10 NOTE — Progress Notes (Signed)
PHARMACIST - PHYSICIAN COMMUNICATION  CONCERNING:  Enoxaparin (Lovenox) for DVT Prophylaxis    RECOMMENDATION: Patient was prescribed enoxaprin 40mg  q24 hours for VTE prophylaxis.   Filed Weights   02/09/21 2222  Weight: 66 kg (145 lb 8.1 oz)    Body mass index is 26.61 kg/m.  Estimated Creatinine Clearance: 25.4 mL/min (A) (by C-G formula based on SCr of 1.34 mg/dL (H)).   Patient is candidate for enoxaparin 30mg  every 24 hours based on CrCl <85ml/min or Weight <45kg  DESCRIPTION: Pharmacy has adjusted enoxaparin dose per [facility] policy.  Patient is now receiving enoxaparin 30 mg every 24 hours   Renda Rolls, PharmD, [facility] 02/10/2021 2:54 AM

## 2021-02-10 NOTE — H&P (Signed)
History and Physical    MYRA WENG ZOX:[id number] DOB: 06-Feb-1929 DOA: 02/09/2021  PCP: Jerrol Banana., MD   Patient coming from: Home  I have personally briefly reviewed patient's old medical records in [place]  Chief Complaint: Fall, weakness, COVID-positive  HPI: [name] is a 87 y.o. female with medical history significant for Paroxysmal A. fib not on anticoagulation, COPD with chronic cough, diabetes, COPD, CKD stage llla, chronic anemia, dementia with history of frequent falls, recently diagnosed with COVID on 5/13 after becoming symptomatic for cough and weakness, started on prednisone on 5/15 by her PCP, and who presented to the emergency room for fall on 5/15 sustaining a laceration to her left ankle requiring sutures, who presents to the emergency room again on 5/16 following another fall at home.  On this occasion her daughter noticed that she had intermittent garbled speech and was concerned that it could be either a stroke or a side effect of having started prednisone.  She states patient has hallucinated with prednisone in the past.  She states that her mom was actually improving from [place] and her cough is at the baseline for her COPD but she continues to be extremely weak.  At baseline patient lives alone and is able to function with patient checking in on her. ED course: On arrival, afebrile, BP 131/55, pulse 85 with O2 sat 98% on room air.  Blood work significant for sodium 130, creatinine 1.34 which is around her baseline, hemoglobin of 8.5, down from baseline of 10,.  Troponin 10, D-dimer 1.44 other inflammatory biomarkers fibrinogen, LDH and ferritin all within normal limits.  Procalcitonin less than 0.10 EKG, personally viewed and interpreted NSR at 74 with no acute ST-T wave changes Imaging: CT head and C-spine with no acute findings. X-rays left shoulder, right forearm no acute findings Chest x-ray no acute abnormality  Patient was  started on remdesivir and Solu-Medrol from the emergency room and hospitalist consulted for admission.  Review of Systems: Limited due to dementia   Past Medical History:  Diagnosis Date  . Angina pectoris ([place])   . Atrial fibrillation ([place])   . Basal cell carcinoma    nose  . Cervicalgia   . Chronic back pain   . Chronic obstructive pulmonary disease (COPD) ([place])   . COPD (chronic obstructive pulmonary disease) ([place])   . DDD (degenerative disc disease), lumbar   . Diabetes mellitus without complication ([place])   . Dysphagia   . Hyperlipemia   . Hypertension   . Squamous cell carcinoma of skin    left lateral pretibial  . Vulvovaginitis     Past Surgical History:  Procedure Laterality Date  . gall bladder    . melanoma     removal neck and back  . PERIPHERAL VASCULAR CATHETERIZATION Left 01/05/2016   Procedure: Lower Extremity Angiography;  Surgeon: Algernon Huxley, MD;  Location: [facility];  Service: Cardiovascular;  Laterality: Left;  . PERIPHERAL VASCULAR CATHETERIZATION  01/05/2016   Procedure: Lower Extremity Intervention;  Surgeon: Algernon Huxley, MD;  Location: [facility];  Service: Cardiovascular;;  . ureterolithiasis     calculus removed  . VAGINAL HYSTERECTOMY    . VISCERAL ANGIOGRAPHY N/A 05/10/2019   Procedure: VISCERAL ANGIOGRAPHY;  Surgeon: Algernon Huxley, MD;  Location: [facility];  Service: Cardiovascular;  Laterality: N/A;  . VULVA / PERINEUM BIOPSY  05/29/2015     reports that she has never smoked. She  has never used smokeless tobacco. She reports that she does not drink alcohol and does not use drugs.  Allergies  Allergen Reactions  . Bacitracin-Neomycin-Polymyxin Rash  . Neomycin-Bacitracin Zn-Polymyx Swelling and Rash  . Latex Rash  . Lidocaine Rash  . Aricept [Donepezil Hcl] Diarrhea  . Benzalkonium Chloride Itching and Swelling  . Ibuprofen     Other reaction(s): Dizziness Heart fluttering. Tachycardia. Tachycardia.  .  Valdecoxib Nausea And Vomiting  . Albuterol Rash  . Tape Rash    blisters  . Triamcinolone Rash    Family History  Problem Relation Age of Onset  . Ovarian cancer Other   . Diabetes Sister   . Colon cancer Brother   . Diabetes Brother   . Atrial fibrillation Daughter   . Breast cancer Neg Hx       Prior to Admission medications   Medication Sig Start Date End Date Taking? Authorizing Provider  albuterol (VENTOLIN HFA) 108 (90 Base) MCG/ACT inhaler INHALE 2 PUFFS INTO THE LUNGS EVERY 4 HOURS AS NEEDED FOR WHEEZING ORSHORTNESS OF BREATH 07/04/19  Yes Kasa, Maretta Bees, MD  Ascorbic Acid (VITAMIN C) 1000 MG tablet Take 1,000 mg by mouth daily.    Yes [provider]  atorvastatin (LIPITOR) 10 MG tablet Take 1 tablet (10 mg total) by mouth daily. 04/30/20 04/30/21 Yes Jerrol Banana., MD  benzonatate (TESSALON) 200 MG capsule Take 200 mg by mouth 3 (three) times daily as needed for cough. For up to 7 days. 02/06/21 02/13/21 Yes [provider]  cetirizine (ZYRTEC) 10 MG tablet TAKE ONE TABLET BY MOUTH EVERY DAY 12/12/18  Yes Jerrol Banana., MD  cholecalciferol (VITAMIN D3) 25 MCG (1000 UT) tablet Take 1,000 Units by mouth daily.   Yes [provider]  clopidogrel (PLAVIX) 75 MG tablet TAKE 1 TABLET BY MOUTH DAILY 04/29/20  Yes Jerrol Banana., MD  FLUoxetine (PROZAC) 20 MG capsule TAKE 1 CAPSULE EVERY DAY 01/02/21  Yes Flinchum, Kelby Aline, FNP  insulin lispro (HUMALOG) 100 UNIT/ML KiwkPen Inject 10 Units into the skin 3 (three) times daily. 10 units before breakfast, 8 before lunch and 18 before supper   Yes [provider]  ADVAIR HFA 230-21 MCG/ACT inhaler TAKE 2 PUFFS INTO LUNGS TWICE A DAY 02/04/21   Flora Lipps, MD  aspirin 325 MG tablet Take 325 mg by mouth daily. Reported on 01/05/2016    [provider]  doxycycline (VIBRA-TABS) 100 MG tablet Take 100 mg by mouth 2 (two) times daily. 02/06/21   [provider]  ezetimibe  (ZETIA) 10 MG tablet TAKE 1 TABLET BY MOUTH DAILY 04/29/20   Jerrol Banana., MD  glucose blood test strip ACCU-CHEK AVIVA PLUS TEST STRP Use to check sugars 3 times daily. 10/28/15   [provider]  insulin glargine (LANTUS) 100 UNIT/ML injection Inject 0.05 mLs (5 Units total) into the skin at bedtime. 07/02/16   Epifanio Lesches, MD  isosorbide mononitrate (IMDUR) 30 MG 24 hr tablet Take 30 mg by mouth daily. 06/12/20   [provider]  lansoprazole (PREVACID) 30 MG capsule TAKE 1 CAPSULE BY MOUTH ONCE DAILY AT NOON 12/03/20   Jerrol Banana., MD  LANTUS SOLOSTAR 100 UNIT/ML Solostar Pen Inject 18 Units into the skin every evening. 10/14/20   [provider]  losartan (COZAAR) 100 MG tablet TAKE ONE TABLET EVERY DAY 11/12/20   Jerrol Banana., MD  Magnesium 400 MG CAPS Take by mouth daily.  [provider]  meloxicam (MOBIC) 7.5 MG tablet Take 1 tablet (7.5 mg total) by mouth daily as needed for pain. 03/18/20   Jerrol Banana., MD  memantine [facility]) 5 MG tablet TAKE ONE TABLET TWICE DAILY 12/03/20   Jerrol Banana., MD  montelukast (SINGULAIR) 10 MG tablet TAKE ONE TABLET AT BEDTIME 12/03/20   Jerrol Banana., MD  ondansetron (ZOFRAN-ODT) 4 MG disintegrating tablet Take 4 mg by mouth every 8 (eight) hours as needed for nausea. For up to 12 doses. 02/06/21   [provider]  pantoprazole (PROTONIX) 40 MG tablet Take 40 mg by mouth daily.    [provider]  potassium chloride (KLOR-CON) 10 MEQ tablet TAKE 1 TABLET BY MOUTH TWICE DAILY 01/30/21   Jerrol Banana., MD  predniSONE (DELTASONE) 20 MG tablet Take 20 mg by mouth daily. For 5 days. 02/06/21 02/11/21  [provider]  QUEtiapine (SEROQUEL) 25 MG tablet Take 1 tablet by mouth at bedtime. 03/28/20   [provider]  TIADYLT ER 360 MG 24 hr capsule TAKE 1 CAPSULE EVERY DAY 11/12/20   Jerrol Banana., MD  traZODone (DESYREL) 50 MG  tablet TAKE 1 AND 1/2 TABLET AT BEDTIME AS NEEDED FOR SLEEP 10/14/20   Jerrol Banana., MD  TUSSIN DM 100-10 MG/5ML liquid TAKE 1 TEASPOONFUL BY MOUTH EVERY 4 HOURS AS NEEDED FOR COUGH 08/20/19   Flora Lipps, MD  vitamin E 1000 UNIT capsule Take 1,000 Units by mouth daily.    [provider]    Physical Exam: Vitals:   02/10/21 0030 02/10/21 0242 02/10/21 0336 02/10/21 0359  BP: (!) 102/48 (!) 110/49 (!) 109/49 (!) 143/55  Pulse: 72 74 73 77  Resp: 13 14 (!) 27 16  Temp:  98.3 F (36.8 C)  98.2 F (36.8 C)  TempSrc:  Oral    SpO2: 96% 100% 96% 98%  Weight:      Height:         Vitals:   02/10/21 0030 02/10/21 0242 02/10/21 0336 02/10/21 0359  BP: (!) 102/48 (!) 110/49 (!) 109/49 (!) 143/55  Pulse: 72 74 73 77  Resp: 13 14 (!) 27 16  Temp:  98.3 F (36.8 C)  98.2 F (36.8 C)  TempSrc:  Oral    SpO2: 96% 100% 96% 98%  Weight:      Height:          Constitutional: Alert and oriented x 2 . Not in any apparent distress HEENT:      Head: Normocephalic and atraumatic.         Eyes: PERLA, EOMI, Conjunctivae are normal. Sclera is non-icteric.       Mouth/Throat: Mucous membranes are moist.       Neck: Supple with no signs of meningismus. Cardiovascular: Regular rate and rhythm. No murmurs, gallops, or rubs. 2+ symmetrical distal pulses are present . No JVD. No LE edema Respiratory: Respiratory effort normal .Lungs sounds clear bilaterally. No wheezes, crackles, or rhonchi.  Gastrointestinal: Soft, non tender, and non distended with positive bowel sounds.  Genitourinary: No CVA tenderness. Musculoskeletal: Nontender with normal range of motion in all extremities.  Laceration lateral left ankle. No cyanosis, or erythema of extremities. Neurologic:  Face is symmetric. Moving all extremities. No gross focal neurologic deficits . Skin: Skin is warm, dry.  Laceration lateral left ankle Psychiatric: Mood and affect are normal    Labs on Admission: I have  personally reviewed following  labs and imaging studies  CBC: Recent Labs  Lab 02/08/21 1200 02/09/21 2225  WBC 7.3 11.7*  NEUTROABS 3.8 6.6  HGB 10.2* 8.5*  HCT 29.9* 25.3*  MCV 88.2 88.8  PLT 215 [id number]   Basic Metabolic Panel: Recent Labs  Lab 02/08/21 1200 02/09/21 2225  NA 131* 130*  K 3.7 4.2  CL 99 98  CO2 22 22  GLUCOSE 325* 173*  BUN 22 29*  CREATININE 1.06* 1.34*  CALCIUM 7.9* 8.0*   GFR: Estimated Creatinine Clearance: 25.4 mL/min (A) (by C-G formula based on SCr of 1.34 mg/dL (H)). Liver Function Tests: Recent Labs  Lab 02/08/21 1200 02/09/21 2225  AST 19 17  ALT 14 13  ALKPHOS 65 63  BILITOT 0.5 0.6  PROT 5.5* 5.9*  ALBUMIN 2.9* 3.1*   No results for input(s): LIPASE, AMYLASE in the last 168 hours. No results for input(s): AMMONIA in the last 168 hours. Coagulation Profile: No results for input(s): INR, PROTIME in the last 168 hours. Cardiac Enzymes: No results for input(s): CKTOTAL, CKMB, CKMBINDEX, TROPONINI in the last 168 hours. BNP (last 3 results) No results for input(s): PROBNP in the last 8760 hours. HbA1C: Recent Labs    02/08/21 1200  HGBA1C 7.6*   CBG: Recent Labs  Lab 02/08/21 1259 02/08/21 1403 02/10/21 0145  GLUCAP 295* 221* 119*   Lipid Profile: No results for input(s): CHOL, HDL, LDLCALC, TRIG, CHOLHDL, LDLDIRECT in the last 72 hours. Thyroid Function Tests: No results for input(s): TSH, T4TOTAL, FREET4, T3FREE, THYROIDAB in the last 72 hours. Anemia Panel: Recent Labs    02/09/21 2354  FERRITIN 47   Urine analysis:    Component Value Date/Time   COLORURINE STRAW (A) 08/23/2018 0408   APPEARANCEUR CLEAR (A) 08/23/2018 0408   APPEARANCEUR Clear 01/23/2015 1444   LABSPEC 1.010 08/23/2018 0408   LABSPEC 1.013 01/23/2015 1444   PHURINE 8.0 08/23/2018 0408   GLUCOSEU 50 (A) 08/23/2018 0408   GLUCOSEU Negative 01/23/2015 1444   HGBUR NEGATIVE 08/23/2018 0408   BILIRUBINUR negative 06/20/2019 1429   BILIRUBINUR  Negative 01/23/2015 1444   KETONESUR NEGATIVE 08/23/2018 0408   PROTEINUR Positive (A) 06/20/2019 1429   PROTEINUR >=300 (A) 08/23/2018 0408   UROBILINOGEN 0.2 06/20/2019 1429   NITRITE negative 06/20/2019 1429   NITRITE NEGATIVE 08/23/2018 0408   LEUKOCYTESUR Negative 06/20/2019 1429   LEUKOCYTESUR Negative 01/23/2015 1444    Radiological Exams on Admission: DG Forearm Right  Result Date: 02/09/2021 CLINICAL DATA:  Recent fall with forearm pain, initial encounter EXAM: RIGHT FOREARM - 2 VIEW COMPARISON:  None. FINDINGS: No acute fracture in the radius or ulna is seen. Atherosclerotic calcifications are noted in the vasculature. Degenerative changes about wrist joint are seen. IMPRESSION: No acute abnormality noted. Electronically Signed   By: Inez Catalina M.D.   On: 02/09/2021 23:42   DG Ankle Complete Left  Result Date: 02/08/2021 CLINICAL DATA:  87 year old who fell and sustained a laceration to the lateral LEFT ankle on glass. Initial encounter. EXAM: LEFT ANKLE COMPLETE - 3+ VIEW COMPARISON:  No prior LEFT ankle images. LEFT foot x-rays 02/13/2018 are correlated. FINDINGS: Examination was performed with bandage material in place. No opaque foreign bodies in the soft tissues to suggest a glass shard. No evidence of acute fracture. Tibiotalar joint anatomically aligned with a well-preserved joint space. Osseous demineralization. No visible joint effusion. Small plantar calcaneal spur. IMPRESSION: 1. No acute osseous abnormality. 2. No opaque foreign body. 3. Osseous demineralization. Electronically Signed   By:  Evangeline Dakin M.D.   On: 02/08/2021 13:48   CT Head Wo Contrast  Result Date: 02/09/2021 CLINICAL DATA:  Fall EXAM: CT HEAD WITHOUT CONTRAST CT CERVICAL SPINE WITHOUT CONTRAST TECHNIQUE: Multidetector CT imaging of the head and cervical spine was performed following the standard protocol without intravenous contrast. Multiplanar CT image reconstructions of the cervical spine were  also generated. COMPARISON:  None. FINDINGS: CT HEAD FINDINGS Brain: No acute territorial infarction, hemorrhage or intracranial mass. Mild atrophy. Patchy white matter hypodensity consistent with chronic small vessel ischemic change. Vascular: No hyperdense vessels.  Carotid vascular calcification Skull: Normal. Negative for fracture or focal lesion. Sinuses/Orbits: No acute finding. Other: None CT CERVICAL SPINE FINDINGS Alignment: Trace anterolisthesis C3 on C4 and C4 on C5 likely degenerative. Facet alignment is maintained Skull base and vertebrae: No acute fracture. No primary bone lesion or focal pathologic process. Soft tissues and spinal canal: No prevertebral fluid or swelling. No visible canal hematoma. Disc levels: Moderate disc space narrowing and degenerative change C5-C6. Facet degenerative changes at multiple levels with foraminal narrowing. Upper chest: Negative. Other: None IMPRESSION: 1. No CT evidence for acute intracranial abnormality. Atrophy and chronic small vessel ischemic change of the white matter 2. Degenerative changes. Trace anterolisthesis C3 on C4 and C4 on C5 likely due to degenerative change. No acute osseous abnormality. Electronically Signed   By: Donavan Foil M.D.   On: 02/09/2021 23:26   CT Cervical Spine Wo Contrast  Result Date: 02/09/2021 CLINICAL DATA:  Fall EXAM: CT HEAD WITHOUT CONTRAST CT CERVICAL SPINE WITHOUT CONTRAST TECHNIQUE: Multidetector CT imaging of the head and cervical spine was performed following the standard protocol without intravenous contrast. Multiplanar CT image reconstructions of the cervical spine were also generated. COMPARISON:  None. FINDINGS: CT HEAD FINDINGS Brain: No acute territorial infarction, hemorrhage or intracranial mass. Mild atrophy. Patchy white matter hypodensity consistent with chronic small vessel ischemic change. Vascular: No hyperdense vessels.  Carotid vascular calcification Skull: Normal. Negative for fracture or focal  lesion. Sinuses/Orbits: No acute finding. Other: None CT CERVICAL SPINE FINDINGS Alignment: Trace anterolisthesis C3 on C4 and C4 on C5 likely degenerative. Facet alignment is maintained Skull base and vertebrae: No acute fracture. No primary bone lesion or focal pathologic process. Soft tissues and spinal canal: No prevertebral fluid or swelling. No visible canal hematoma. Disc levels: Moderate disc space narrowing and degenerative change C5-C6. Facet degenerative changes at multiple levels with foraminal narrowing. Upper chest: Negative. Other: None IMPRESSION: 1. No CT evidence for acute intracranial abnormality. Atrophy and chronic small vessel ischemic change of the white matter 2. Degenerative changes. Trace anterolisthesis C3 on C4 and C4 on C5 likely due to degenerative change. No acute osseous abnormality. Electronically Signed   By: Donavan Foil M.D.   On: 02/09/2021 23:26   DG Chest Portable 1 View  Result Date: 02/09/2021 CLINICAL DATA:  Recent fall with chest pain, initial encounter EXAM: PORTABLE CHEST 1 VIEW COMPARISON:  07/24/2020 FINDINGS: Cardiac shadow is within normal limits. Aortic calcifications are again noted. Lungs are clear. No acute bony abnormality is seen. IMPRESSION: No acute abnormality noted. Electronically Signed   By: Inez Catalina M.D.   On: 02/09/2021 23:43   DG Shoulder Left  Result Date: 02/09/2021 CLINICAL DATA:  Recent fall with shoulder pain, initial encounter EXAM: LEFT SHOULDER - 2+ VIEW COMPARISON:  10/16/2020 FINDINGS: Degenerative changes of the acromioclavicular joint are noted. No acute fracture or dislocation is noted. No soft tissue abnormality is seen. IMPRESSION: No acute  abnormality noted.  Degenerative changes are seen. Electronically Signed   By: Inez Catalina M.D.   On: 02/09/2021 23:41     Assessment/Plan 87 year old female who lives alone with history of paroxysmal A. fib not on anticoagulation, COPD with chronic cough, diabetes, COPD, CKD stage  llla, chronic anemia, dementia with history of frequent falls, recently diagnosed with COVID on 5/13 presenting with weakness, repeated falls, confusion and garbled speech.    Acute metabolic encephalopathy in the setting of dementia  Dementia (HCC)   Garbled speech, intermittent - Suspect related to COVID in the setting of dementia - Patient had transient garbled speech but CT head negative and exam is nonfocal - Monitor neurologic status - Fall and aspiration precautions    Frequent falls Laceration left ankle status post suturing, secondary to fall on 5/15 - PT and TOC consult - Patient currently lives alone - Wound care.  Suture removal as prescribed by ED provider    COVID-19 virus infection, positive on 02/06/2021 - Patient symptomatic for weakness but with no respiratory symptoms and chest x-ray clear - Continue remdesivir.  Will discontinue steroids     Anemia, acute on chronic likely related to acute blood loss from laceration - Hemoglobin 8.5 down from baseline of 10 - Possibly related to blood loss from ankle laceration on 8/15 - Continue to monitor    Benign essential HTN - Continue home meds pending verification    Paroxysmal atrial fibrillation (HCC) - Continue clopidogrel,.  Not noted to be on rate control agent but med rec pending    Chronic obstructive pulmonary disease ([place]) - Not acutely exacerbated - Continue home inhalers with duo nebs as needed    Type II diabetes mellitus with renal manifestations (HCC) Sliding scale insulin for now    Coronary artery disease involving native coronary artery of native heart - Continue clopidogrel and atorvastatin.  Beta-blocker not noted on med list    Chronic kidney disease, stage 3a ([place]) - Renal function at baseline    DVT prophylaxis: Lovenox  Code Status: DNR, discussed with daughter at bedside Family Communication: Daughter at bedside Disposition Plan: Might need SNF Consults called: none  Status:At the  time of admission, it appears that the appropriate admission status for this patient is INPATIENT. This is judged to be reasonable and necessary in order to provide the required intensity of service to ensure the patient's safety given the presenting symptoms, physical exam findings, and initial radiographic and laboratory data in the context of their  Comorbid conditions.   Patient requires inpatient status due to high intensity of service, high risk for further deterioration and high frequency of surveillance required.   I certify that at the point of admission it is my clinical judgment that the patient will require inpatient hospital care spanning beyond [place] MD [facility]     02/10/2021, 4:20 AM

## 2021-02-11 ENCOUNTER — Telehealth: Payer: Self-pay

## 2021-02-11 ENCOUNTER — Ambulatory Visit (INDEPENDENT_AMBULATORY_CARE_PROVIDER_SITE_OTHER): Payer: Medicare Other

## 2021-02-11 DIAGNOSIS — U071 COVID-19: Secondary | ICD-10-CM | POA: Diagnosis not present

## 2021-02-11 DIAGNOSIS — I471 Supraventricular tachycardia: Secondary | ICD-10-CM

## 2021-02-11 DIAGNOSIS — R296 Repeated falls: Secondary | ICD-10-CM | POA: Diagnosis not present

## 2021-02-11 DIAGNOSIS — Z794 Long term (current) use of insulin: Secondary | ICD-10-CM

## 2021-02-11 DIAGNOSIS — E1121 Type 2 diabetes mellitus with diabetic nephropathy: Secondary | ICD-10-CM

## 2021-02-11 DIAGNOSIS — S91012D Laceration without foreign body, left ankle, subsequent encounter: Secondary | ICD-10-CM

## 2021-02-11 DIAGNOSIS — I4891 Unspecified atrial fibrillation: Secondary | ICD-10-CM | POA: Diagnosis not present

## 2021-02-11 DIAGNOSIS — G9341 Metabolic encephalopathy: Secondary | ICD-10-CM | POA: Diagnosis not present

## 2021-02-11 DIAGNOSIS — I480 Paroxysmal atrial fibrillation: Secondary | ICD-10-CM | POA: Diagnosis not present

## 2021-02-11 DIAGNOSIS — Z9181 History of falling: Secondary | ICD-10-CM

## 2021-02-11 LAB — GLUCOSE, CAPILLARY
Glucose-Capillary: 165 mg/dL — ABNORMAL HIGH (ref 70–99)
Glucose-Capillary: 155 mg/dL — ABNORMAL HIGH (ref 70–99)
Glucose-Capillary: 113 mg/dL — ABNORMAL HIGH (ref 70–99)
Glucose-Capillary: 247 mg/dL — ABNORMAL HIGH (ref 70–99)

## 2021-02-11 LAB — IRON AND TIBC
Iron: 23 ug/dL — ABNORMAL LOW (ref 28–170)
Saturation Ratios: 7 % — ABNORMAL LOW (ref 10.4–31.8)
TIBC: 309 ug/dL (ref 250–450)
UIBC: 286 ug/dL

## 2021-02-11 LAB — TYPE AND SCREEN
ABO/RH(D): A POS
Antibody Screen: NEGATIVE

## 2021-02-11 LAB — VITAMIN B12: Vitamin B-12: 263 pg/mL (ref 180–914)

## 2021-02-11 MED ORDER — SODIUM CHLORIDE 0.9 % IV SOLN: INTRAVENOUS | Status: DC

## 2021-02-11 MED ORDER — ISOSORBIDE MONONITRATE ER 30 MG PO TB24
30.0000 mg | ORAL_TABLET | Freq: Every day | ORAL | Status: DC
  Administered 2021-02-11 – 2021-02-12 (×2): 30 mg via ORAL
  Filled 2021-02-11 (×2): qty 1

## 2021-02-11 MED ORDER — LOSARTAN POTASSIUM 50 MG PO TABS
100.0000 mg | ORAL_TABLET | Freq: Every day | ORAL | Status: DC
  Administered 2021-02-11: 100 mg via ORAL
  Filled 2021-02-11: qty 2

## 2021-02-11 MED ORDER — METOPROLOL TARTRATE 5 MG/5ML IV SOLN: 5.0000 mg | Freq: Four times a day (QID) | INTRAVENOUS | Status: DC | PRN

## 2021-02-11 MED ORDER — DILTIAZEM HCL ER COATED BEADS 180 MG PO CP24
360.0000 mg | ORAL_CAPSULE | Freq: Every day | ORAL | Status: DC
  Administered 2021-02-11 – 2021-02-12 (×2): 360 mg via ORAL
  Filled 2021-02-11 (×2): qty 2

## 2021-02-11 MED ORDER — ADENOSINE 6 MG/2ML IV SOLN: 6.0000 mg | Freq: Once | INTRAVENOUS | Status: DC

## 2021-02-11 MED ORDER — DIGOXIN 0.25 MG/ML IJ SOLN
0.2500 mg | Freq: Once | INTRAMUSCULAR | Status: DC
  Filled 2021-02-11: qty 2

## 2021-02-11 MED ORDER — DILTIAZEM HCL 25 MG/5ML IV SOLN
10.0000 mg | Freq: Once | INTRAVENOUS | Status: AC
  Administered 2021-02-11: 10 mg via INTRAVENOUS
  Filled 2021-02-11 (×2): qty 5

## 2021-02-11 MED ORDER — ADENOSINE 6 MG/2ML IV SOLN
3.0000 mg | Freq: Once | INTRAVENOUS | Status: AC
  Administered 2021-02-11: 3 mg via INTRAVENOUS
  Filled 2021-02-11: qty 1

## 2021-02-11 NOTE — TOC Progression Note (Signed)
Transition of Care [facility]) - Progression Note    Patient Details  Name: LAKIMA DONA MRN: [id number] Date of Birth: Jan 22, 1929  Transition of [facility] CM/SW [place], RN Phone Number: 02/11/2021, 12:21 PM  Clinical Narrative:   Duwayne Heck RN  At PCP office upon request and spoke to her concerning the patient and explained that the patient has worked with PT and that she did well, I am unable to submit for insurance approval to go to SNF when the recommendation is Home health, the only option would be to have the family pay out of pocket.  I will call the daughter [name]    Expected Discharge Plan: [place] Barriers to Discharge: Barriers Resolved  Expected Discharge Plan and Services Expected Discharge Plan: [place]   Discharge Planning Services: CM Consult     Expected Discharge Date: 02/10/21               DME Arranged: N/A         HH Arranged: PT HH Agency: [facility] Date [place]: 02/10/21 Time [place] Agency Contacted: 1500 Representative spoke with at [place]: [place]   Social Determinants of Health ([place]) Interventions    Readmission Risk Interventions No flowsheet data found.

## 2021-02-11 NOTE — Telephone Encounter (Signed)
Copied from [place] [id number]. Topic: Referral - Question >> Feb 10, 2021  5:08 PM P[place], Brayton Layman A wrote: Reason for CRM: Caller stated the pt fell twice yesterday, pt went to the ED, caller asked for a referral for short term rehab also needs a referral for PT. Please call back.

## 2021-02-11 NOTE — Consult Note (Signed)
Cardiology Consultation Note    Patient ID: [name], MRN: [id number], DOB/AGE: 85-Oct-1930 85 y.o. Admit date: 02/09/2021   Date of Consult: 02/11/2021 Primary Physician: Jerrol Banana., MD Primary Cardiologist: Nehemiah Massed  Chief Complaint: tachycardia Reason for Consultation: tachycardia Requesting MD: zhang  HPI: [name] is a 87 y.o. female with history of paroxysmal atrial fibrillation not on anticoagulation, COPD, cardiac kidney disease grade 3, dementia with history of frequent falls, COVID 19 infection started on prednisone who presented to the emergency room with a fall causing laceration of her left ankle.  Daughter was concerned about garbled speech.  EKG showed sinus rhythm.  Telemetry showed a narrow complex tachycardia.  Consultation to determine whether or not it is atrial flutter or sinus tachycardia.  Patient not able to give any history.  Past Medical History:  Diagnosis Date  . Angina pectoris ([place])   . Atrial fibrillation ([place])   . Basal cell carcinoma    nose  . Cervicalgia   . Chronic back pain   . Chronic obstructive pulmonary disease (COPD) ([place])   . COPD (chronic obstructive pulmonary disease) ([place])   . DDD (degenerative disc disease), lumbar   . Diabetes mellitus without complication ([place])   . Dysphagia   . Hyperlipemia   . Hypertension   . Squamous cell carcinoma of skin    left lateral pretibial  . Vulvovaginitis       Surgical History:  Past Surgical History:  Procedure Laterality Date  . gall bladder    . melanoma     removal neck and back  . PERIPHERAL VASCULAR CATHETERIZATION Left 01/05/2016   Procedure: Lower Extremity Angiography;  Surgeon: Algernon Huxley, MD;  Location: [facility];  Service: Cardiovascular;  Laterality: Left;  . PERIPHERAL VASCULAR CATHETERIZATION  01/05/2016   Procedure: Lower Extremity Intervention;  Surgeon: Algernon Huxley, MD;  Location: [facility];  Service: Cardiovascular;;   . ureterolithiasis     calculus removed  . VAGINAL HYSTERECTOMY    . VISCERAL ANGIOGRAPHY N/A 05/10/2019   Procedure: VISCERAL ANGIOGRAPHY;  Surgeon: Algernon Huxley, MD;  Location: [facility];  Service: Cardiovascular;  Laterality: N/A;  . VULVA / PERINEUM BIOPSY  05/29/2015     Home Meds: Prior to Admission medications   Medication Sig Start Date End Date Taking? Authorizing Provider  albuterol (VENTOLIN HFA) 108 (90 Base) MCG/ACT inhaler INHALE 2 PUFFS INTO THE LUNGS EVERY 4 HOURS AS NEEDED FOR WHEEZING ORSHORTNESS OF BREATH Patient taking differently: Inhale 2 puffs into the lungs every 4 (four) hours as needed for wheezing or shortness of breath. INHALE 2 PUFFS INTO THE LUNGS EVERY 4 HOURS AS NEEDED FOR WHEEZING ORSHORTNESS OF BREATH 07/04/19  Yes Kasa, Maretta Bees, MD  Ascorbic Acid (VITAMIN C) 1000 MG tablet Take 1,000 mg by mouth daily.    Yes [provider]  atorvastatin (LIPITOR) 10 MG tablet Take 1 tablet (10 mg total) by mouth daily. 04/30/20 04/30/21 Yes Jerrol Banana., MD  benzonatate (TESSALON) 200 MG capsule Take 200 mg by mouth 3 (three) times daily as needed for cough. For up to 7 days. 02/06/21 02/13/21 Yes [provider]  cetirizine (ZYRTEC) 10 MG tablet TAKE ONE TABLET BY MOUTH EVERY DAY Patient taking differently: Take 10 mg by mouth daily. 12/12/18  Yes Jerrol Banana., MD  cholecalciferol (VITAMIN D3) 25 MCG (1000 UT) tablet Take 1,000 Units by mouth daily.   Yes [provider]  clopidogrel (PLAVIX) 75 MG tablet TAKE 1 TABLET BY MOUTH DAILY Patient taking differently: Take 75 mg by mouth daily. 04/29/20  Yes Maple Hudson., MD  ezetimibe (ZETIA) 10 MG tablet TAKE 1 TABLET BY MOUTH DAILY Patient taking differently: Take 10 mg by mouth daily. 04/29/20  Yes Maple Hudson., MD  FLUoxetine (PROZAC) 20 MG capsule TAKE 1 CAPSULE EVERY DAY Patient taking differently: Take 20 mg by mouth daily. 01/02/21  Yes Flinchum, Eula Fried,  FNP  insulin glargine (LANTUS) 100 UNIT/ML injection Inject 0.05 mLs (5 Units total) into the skin at bedtime. 07/02/16  Yes Katha Hamming, MD  insulin lispro (HUMALOG) 100 UNIT/ML KiwkPen Inject 10 Units into the skin 3 (three) times daily. 10 units before breakfast, 8 before lunch and 18 before supper   Yes [provider]  lansoprazole (PREVACID) 30 MG capsule TAKE 1 CAPSULE BY MOUTH ONCE DAILY AT NOON Patient taking differently: Take 30 mg by mouth daily at 12 noon. TAKE 1 CAPSULE BY MOUTH ONCE DAILY AT NOON 12/03/20  Yes Maple Hudson., MD  magnesium oxide (MAG-OX) 400 MG tablet Take 400 mg by mouth daily.   Yes [provider]  ADVAIR HFA 230-21 MCG/ACT inhaler TAKE 2 PUFFS INTO LUNGS TWICE A DAY Patient taking differently: Inhale 2 puffs into the lungs 2 (two) times daily. 02/04/21   Erin Fulling, MD  aspirin 325 MG tablet Take 325 mg by mouth daily. Reported on 01/05/2016    [provider]  diltiazem (TIADYLT ER) 360 MG 24 hr capsule Hold until followup with your outpatient doctor due to your blood pressure on the low side. 02/10/21   Darlin Priestly, MD  doxycycline (VIBRA-TABS) 100 MG tablet Take 100 mg by mouth 2 (two) times daily. Patient not taking: Reported on 02/11/2021 02/06/21   [provider]  glucose blood test strip ACCU-CHEK AVIVA PLUS TEST STRP Use to check sugars 3 times daily. 10/28/15   [provider]  isosorbide mononitrate (IMDUR) 30 MG 24 hr tablet Take 30 mg by mouth daily. 06/12/20   [provider]  LANTUS SOLOSTAR 100 UNIT/ML Solostar Pen Inject 18 Units into the skin every evening. 10/14/20   [provider]  losartan (COZAAR) 100 MG tablet Take 0.5 tablets (50 mg total) by mouth daily. This is a decrease from 100 mg daily. 02/10/21   Darlin Priestly, MD  Magnesium 400 MG CAPS Take by mouth daily.    [provider]  meloxicam (MOBIC) 7.5 MG tablet Take 1 tablet (7.5 mg total) by mouth daily as needed  for pain. 03/18/20   Maple Hudson., MD  memantine (NAMENDA) 5 MG tablet TAKE ONE TABLET TWICE DAILY Patient taking differently: Take 5 mg by mouth 2 (two) times daily. 12/03/20   Maple Hudson., MD  montelukast (SINGULAIR) 10 MG tablet TAKE ONE TABLET AT BEDTIME Patient taking differently: Take 10 mg by mouth at bedtime. 12/03/20   Maple Hudson., MD  ondansetron (ZOFRAN-ODT) 4 MG disintegrating tablet Take 4 mg by mouth every 8 (eight) hours as needed for nausea. For up to 12 doses. 02/06/21   [provider]  pantoprazole (PROTONIX) 40 MG tablet Take 40 mg by mouth daily. Patient not taking: Reported on 02/11/2021    [provider]  potassium chloride (KLOR-CON) 10 MEQ tablet TAKE 1 TABLET BY MOUTH TWICE DAILY Patient taking differently: Take 10 mEq by mouth 2 (two) times daily. 01/30/21  Jerrol Banana., MD  predniSONE (DELTASONE) 20 MG tablet Take 20 mg by mouth daily. For 5 days. Patient not taking: Reported on 02/11/2021 02/06/21 02/11/21  [provider]  QUEtiapine (SEROQUEL) 25 MG tablet Take 1 tablet by mouth at bedtime. 03/28/20   [provider]  traZODone (DESYREL) 50 MG tablet TAKE 1 AND 1/2 TABLET AT BEDTIME AS NEEDED FOR SLEEP Patient taking differently: Take 75 mg by mouth at bedtime as needed for sleep. 10/14/20   Jerrol Banana., MD  TUSSIN DM 100-10 MG/5ML liquid TAKE 1 TEASPOONFUL BY MOUTH EVERY 4 HOURS AS NEEDED FOR COUGH 08/20/19   Flora Lipps, MD  vitamin E 1000 UNIT capsule Take 1,000 Units by mouth daily.    [provider]    Inpatient Medications:  . vitamin C  500 mg Oral Daily  . enoxaparin (LOVENOX) injection  30 mg Subcutaneous Q24H  . insulin aspart  0-15 Units Subcutaneous TID WC  . insulin aspart  0-5 Units Subcutaneous QHS  . isosorbide mononitrate  30 mg Oral Daily  . losartan  100 mg Oral Daily  . zinc sulfate  220 mg Oral Daily   . sodium chloride 75 mL/hr at 02/11/21 1435     Allergies:  Allergies  Allergen Reactions  . Bacitracin-Neomycin-Polymyxin Rash  . Neomycin-Bacitracin Zn-Polymyx Swelling and Rash  . Latex Rash  . Lidocaine Rash  . Aricept [Donepezil Hcl] Diarrhea  . Benzalkonium Chloride Itching and Swelling  . Ibuprofen     Other reaction(s): Dizziness Heart fluttering. Tachycardia. Tachycardia.  . Valdecoxib Nausea And Vomiting  . Albuterol Rash  . Tape Rash    blisters  . Triamcinolone Rash    Social History   Socioeconomic History  . Marital status: Widowed    Spouse name: Not on file  . Number of children: 3  . Years of education: Not on file  . Highest education level: 8th grade  Occupational History  . Occupation: retired  Tobacco Use  . Smoking status: Never Smoker  . Smokeless tobacco: Never Used  Vaping Use  . Vaping Use: Never used  Substance and Sexual Activity  . Alcohol use: No  . Drug use: No  . Sexual activity: Never  Other Topics Concern  . Not on file  Social History Narrative  . Not on file   Social Determinants of Health   Financial Resource Strain: Low Risk   . Difficulty of Paying Living Expenses: Not hard at all  Food Insecurity: No Food Insecurity  . Worried About Charity fundraiser in the Last Year: Never true  . Ran Out of Food in the Last Year: Never true  Transportation Needs: No Transportation Needs  . Lack of Transportation (Medical): No  . Lack of Transportation (Non-Medical): No  Physical Activity: Inactive  . Days of Exercise per Week: 0 days  . Minutes of Exercise per Session: 0 min  Stress: No Stress Concern Present  . Feeling of Stress : Only a little  Social Connections: Moderately Isolated  . Frequency of Communication with Friends and Family: More than three times a week  . Frequency of Social Gatherings with Friends and Family: More than three times a week  . Attends Religious Services: More than 4 times per year  . Active Member of Clubs or Organizations: No  .  Attends Archivist Meetings: Never  . Marital Status: Widowed  Intimate Partner Violence: Not At Risk  . Fear of Current or Ex-Partner: No  .  Emotionally Abused: No  . Physically Abused: No  . Sexually Abused: No     Family History  Problem Relation Age of Onset  . Ovarian cancer Other   . Diabetes Sister   . Colon cancer Brother   . Diabetes Brother   . Atrial fibrillation Daughter   . Breast cancer Neg Hx      Review of Systems: A 12-system review of systems was performed and is negative except as noted in the HPI.  Labs: No results for input(s): CKTOTAL, CKMB, TROPONINI in the last 72 hours. Lab Results  Component Value Date   WBC 11.7 (H) 02/09/2021   HGB 8.5 (L) 02/09/2021   HCT 25.3 (L) 02/09/2021   MCV 88.8 02/09/2021   PLT 239 02/09/2021    Recent Labs  Lab 02/09/21 2225  NA 130*  K 4.2  CL 98  CO2 22  BUN 29*  CREATININE 1.34*  CALCIUM 8.0*  PROT 5.9*  BILITOT 0.6  ALKPHOS 63  ALT 13  AST 17  GLUCOSE 173*   Lab Results  Component Value Date   CHOL 128 02/12/2020   HDL 62 02/12/2020   LDLCALC 49 02/12/2020   TRIG 85 02/12/2020   Lab Results  Component Value Date   DDIMER 1.44 (H) 02/09/2021    Radiology/Studies:  DG Forearm Right  Result Date: 02/09/2021 CLINICAL DATA:  Recent fall with forearm pain, initial encounter EXAM: RIGHT FOREARM - 2 VIEW COMPARISON:  None. FINDINGS: No acute fracture in the radius or ulna is seen. Atherosclerotic calcifications are noted in the vasculature. Degenerative changes about wrist joint are seen. IMPRESSION: No acute abnormality noted. Electronically Signed   By: Inez Catalina M.D.   On: 02/09/2021 23:42   DG Ankle Complete Left  Result Date: 02/08/2021 CLINICAL DATA:  87 year old who fell and sustained a laceration to the lateral LEFT ankle on glass. Initial encounter. EXAM: LEFT ANKLE COMPLETE - 3+ VIEW COMPARISON:  No prior LEFT ankle images. LEFT foot x-rays 02/13/2018 are correlated. FINDINGS:  Examination was performed with bandage material in place. No opaque foreign bodies in the soft tissues to suggest a glass shard. No evidence of acute fracture. Tibiotalar joint anatomically aligned with a well-preserved joint space. Osseous demineralization. No visible joint effusion. Small plantar calcaneal spur. IMPRESSION: 1. No acute osseous abnormality. 2. No opaque foreign body. 3. Osseous demineralization. Electronically Signed   By: Evangeline Dakin M.D.   On: 02/08/2021 13:48   CT Head Wo Contrast  Result Date: 02/09/2021 CLINICAL DATA:  Fall EXAM: CT HEAD WITHOUT CONTRAST CT CERVICAL SPINE WITHOUT CONTRAST TECHNIQUE: Multidetector CT imaging of the head and cervical spine was performed following the standard protocol without intravenous contrast. Multiplanar CT image reconstructions of the cervical spine were also generated. COMPARISON:  None. FINDINGS: CT HEAD FINDINGS Brain: No acute territorial infarction, hemorrhage or intracranial mass. Mild atrophy. Patchy white matter hypodensity consistent with chronic small vessel ischemic change. Vascular: No hyperdense vessels.  Carotid vascular calcification Skull: Normal. Negative for fracture or focal lesion. Sinuses/Orbits: No acute finding. Other: None CT CERVICAL SPINE FINDINGS Alignment: Trace anterolisthesis C3 on C4 and C4 on C5 likely degenerative. Facet alignment is maintained Skull base and vertebrae: No acute fracture. No primary bone lesion or focal pathologic process. Soft tissues and spinal canal: No prevertebral fluid or swelling. No visible canal hematoma. Disc levels: Moderate disc space narrowing and degenerative change C5-C6. Facet degenerative changes at multiple levels with foraminal narrowing. Upper chest: Negative. Other: None IMPRESSION: 1.  No CT evidence for acute intracranial abnormality. Atrophy and chronic small vessel ischemic change of the white matter 2. Degenerative changes. Trace anterolisthesis C3 on C4 and C4 on C5 likely  due to degenerative change. No acute osseous abnormality. Electronically Signed   By: Donavan Foil M.D.   On: 02/09/2021 23:26   CT Cervical Spine Wo Contrast  Result Date: 02/09/2021 CLINICAL DATA:  Fall EXAM: CT HEAD WITHOUT CONTRAST CT CERVICAL SPINE WITHOUT CONTRAST TECHNIQUE: Multidetector CT imaging of the head and cervical spine was performed following the standard protocol without intravenous contrast. Multiplanar CT image reconstructions of the cervical spine were also generated. COMPARISON:  None. FINDINGS: CT HEAD FINDINGS Brain: No acute territorial infarction, hemorrhage or intracranial mass. Mild atrophy. Patchy white matter hypodensity consistent with chronic small vessel ischemic change. Vascular: No hyperdense vessels.  Carotid vascular calcification Skull: Normal. Negative for fracture or focal lesion. Sinuses/Orbits: No acute finding. Other: None CT CERVICAL SPINE FINDINGS Alignment: Trace anterolisthesis C3 on C4 and C4 on C5 likely degenerative. Facet alignment is maintained Skull base and vertebrae: No acute fracture. No primary bone lesion or focal pathologic process. Soft tissues and spinal canal: No prevertebral fluid or swelling. No visible canal hematoma. Disc levels: Moderate disc space narrowing and degenerative change C5-C6. Facet degenerative changes at multiple levels with foraminal narrowing. Upper chest: Negative. Other: None IMPRESSION: 1. No CT evidence for acute intracranial abnormality. Atrophy and chronic small vessel ischemic change of the white matter 2. Degenerative changes. Trace anterolisthesis C3 on C4 and C4 on C5 likely due to degenerative change. No acute osseous abnormality. Electronically Signed   By: Donavan Foil M.D.   On: 02/09/2021 23:26   DG Chest Portable 1 View  Result Date: 02/09/2021 CLINICAL DATA:  Recent fall with chest pain, initial encounter EXAM: PORTABLE CHEST 1 VIEW COMPARISON:  07/24/2020 FINDINGS: Cardiac shadow is within normal limits.  Aortic calcifications are again noted. Lungs are clear. No acute bony abnormality is seen. IMPRESSION: No acute abnormality noted. Electronically Signed   By: Inez Catalina M.D.   On: 02/09/2021 23:43   DG Shoulder Left  Result Date: 02/09/2021 CLINICAL DATA:  Recent fall with shoulder pain, initial encounter EXAM: LEFT SHOULDER - 2+ VIEW COMPARISON:  10/16/2020 FINDINGS: Degenerative changes of the acromioclavicular joint are noted. No acute fracture or dislocation is noted. No soft tissue abnormality is seen. IMPRESSION: No acute abnormality noted.  Degenerative changes are seen. Electronically Signed   By: Inez Catalina M.D.   On: 02/09/2021 23:41    Wt Readings from Last 3 Encounters:  02/09/21 66 kg  02/08/21 66 kg  01/27/21 66.3 kg    EKG: Initially sinus rhythm-now in atrial fib with RVR  Physical Exam:  Blood pressure (!) 127/95, pulse 99, temperature 98.6 F (37 C), resp. rate 18, height 5\' 2"  (1.575 m), weight 66 kg, SpO2 (!) 88 %. Body mass index is 26.61 kg/m. General: Well developed, well nourished, in no acute distress. Head: Normocephalic, atraumatic, sclera non-icteric, no xanthomas, nares are without discharge.  Neck: Negative for carotid bruits. JVD not elevated. Lungs: Clear bilaterally to auscultation without wheezes, rales, or rhonchi. Breathing is unlabored. Heart: Irregular rhythm Abdomen: Soft, non-tender, non-distended with normoactive bowel sounds. No hepatomegaly. No rebound/guarding. No obvious abdominal masses. Msk:  Strength and tone appear normal for age. Extremities: No clubbing or cyanosis. No edema.  Distal pedal pulses are 2+ and equal bilaterally. Neuro: Not able to give history     Assessment and Plan  87 y.o. female with history of paroxysmal atrial fibrillation not on anticoagulation, COPD, cardiac kidney disease grade 3, dementia with history of frequent falls, COVID 19 infection started on prednisone who presented to the emergency room with a  fall causing laceration of her left ankle.  Daughter was concerned about garbled speech.  EKG showed sinus rhythm.  Telemetry showed a narrow complex tachycardia.  Consultation to determine whether or not it is atrial flutter or sinus tachycardia.  Patient not able to give any history.  1.  Atrial fibrillation-has had a history of paroxysmal atrial fibrillation and appears to be back in A. fib.  She was not anticoagulated due to frequent falls and dementia.  Will try an IV diltiazem bolus to see if this will help slow her down or convert her.  May need to consider diltiazem drip.  Would refrain from anticoagulation at present.  Signed, Teodoro Spray MD 02/11/2021, 2:57 PM Pager: [phone]

## 2021-02-11 NOTE — Chronic Care Management (AMB) (Signed)
Chronic Care Management   Follow Up Note   02/11/2021 Name: [name] Hall MRN: [id number] DOB: 12-13-1928  Primary Care Provider: Jerrol Banana., MD Reason for referral : Chronic Care Management   [name] Hall is a 87 y.o. year old female who is a primary care patient of Jerrol Banana., MD.   [name] Hall is currently hospitalized and pending discharge. Call received from [name]. Expressed concerns regarding [name] Hall's safety if she is discharged home and unable to ambulate independently or without supervision. The family prefers that she discharge to a short term rehabilitation facility.  Review of [name] Hall's status, including review of consultants reports, relevant labs and test results was conducted today. Collaboration with appropriate care team members was performed as part of the comprehensive evaluation and provision of chronic care management services.    SDOH (Social Determinants of Health) assessments performed: No     Facility-Administered Encounter Medications as of 02/11/2021  Medication  . acetaminophen (TYLENOL) tablet 650 mg  . albuterol (VENTOLIN HFA) 108 (90 Base) MCG/ACT inhaler 1 puff  . ascorbic acid (VITAMIN C) tablet 500 mg  . enoxaparin (LOVENOX) injection 30 mg  . insulin aspart (novoLOG) injection 0-15 Units  . insulin aspart (novoLOG) injection 0-5 Units  . ondansetron (ZOFRAN) tablet 4 mg   Or  . ondansetron (ZOFRAN) injection 4 mg  . zinc sulfate capsule 220 mg   Outpatient Encounter Medications as of 02/11/2021  Medication Sig  . ADVAIR HFA 230-21 MCG/ACT inhaler TAKE 2 PUFFS INTO LUNGS TWICE A DAY (Patient taking differently: Inhale 2 puffs into the lungs 2 (two) times daily.)  . albuterol (VENTOLIN HFA) 108 (90 Base) MCG/ACT inhaler INHALE 2 PUFFS INTO THE LUNGS EVERY 4 HOURS AS NEEDED FOR WHEEZING ORSHORTNESS OF BREATH (Patient taking differently: Inhale 2 puffs into the lungs every  4 (four) hours as needed for wheezing or shortness of breath. INHALE 2 PUFFS INTO THE LUNGS EVERY 4 HOURS AS NEEDED FOR WHEEZING ORSHORTNESS OF BREATH)  . Ascorbic Acid (VITAMIN C) 1000 MG tablet Take 1,000 mg by mouth daily.   Marland Kitchen aspirin 325 MG tablet Take 325 mg by mouth daily. Reported on 01/05/2016  . atorvastatin (LIPITOR) 10 MG tablet Take 1 tablet (10 mg total) by mouth daily.  . benzonatate (TESSALON) 200 MG capsule Take 200 mg by mouth 3 (three) times daily as needed for cough. For up to 7 days.  . cetirizine (ZYRTEC) 10 MG tablet TAKE ONE TABLET BY MOUTH EVERY DAY (Patient taking differently: Take 10 mg by mouth daily.)  . cholecalciferol (VITAMIN D3) 25 MCG (1000 UT) tablet Take 1,000 Units by mouth daily.  . clopidogrel (PLAVIX) 75 MG tablet TAKE 1 TABLET BY MOUTH DAILY (Patient taking differently: Take 75 mg by mouth daily.)  . diltiazem (TIADYLT ER) 360 MG 24 hr capsule Hold until followup with your outpatient doctor due to your blood pressure on the low side.  . doxycycline (VIBRA-TABS) 100 MG tablet Take 100 mg by mouth 2 (two) times daily. (Patient not taking: Reported on 02/11/2021)  . ezetimibe (ZETIA) 10 MG tablet TAKE 1 TABLET BY MOUTH DAILY (Patient taking differently: Take 10 mg by mouth daily.)  . FLUoxetine (PROZAC) 20 MG capsule TAKE 1 CAPSULE EVERY DAY (Patient taking differently: Take 20 mg by mouth daily.)  . glucose blood test strip ACCU-CHEK AVIVA PLUS TEST STRP Use to check sugars 3 times daily.  . insulin glargine (LANTUS) 100 UNIT/ML injection Inject 0.05 mLs (  5 Units total) into the skin at bedtime.  . insulin lispro (HUMALOG) 100 UNIT/ML KiwkPen Inject 10 Units into the skin 3 (three) times daily. 10 units before breakfast, 8 before lunch and 18 before supper  . isosorbide mononitrate (IMDUR) 30 MG 24 hr tablet Take 30 mg by mouth daily.  . lansoprazole (PREVACID) 30 MG capsule TAKE 1 CAPSULE BY MOUTH ONCE DAILY AT NOON (Patient taking differently: Take 30 mg by mouth  daily at 12 noon. TAKE 1 CAPSULE BY MOUTH ONCE DAILY AT NOON)  . LANTUS SOLOSTAR 100 UNIT/ML Solostar Pen Inject 18 Units into the skin every evening.  Marland Kitchen losartan (COZAAR) 100 MG tablet Take 0.5 tablets (50 mg total) by mouth daily. This is a decrease from 100 mg daily.  . Magnesium 400 MG CAPS Take by mouth daily.  . magnesium oxide (MAG-OX) 400 MG tablet Take 400 mg by mouth daily.  . meloxicam (MOBIC) 7.5 MG tablet Take 1 tablet (7.5 mg total) by mouth daily as needed for pain.  . memantine (NAMENDA) 5 MG tablet TAKE ONE TABLET TWICE DAILY (Patient taking differently: Take 5 mg by mouth 2 (two) times daily.)  . montelukast (SINGULAIR) 10 MG tablet TAKE ONE TABLET AT BEDTIME (Patient taking differently: Take 10 mg by mouth at bedtime.)  . ondansetron (ZOFRAN-ODT) 4 MG disintegrating tablet Take 4 mg by mouth every 8 (eight) hours as needed for nausea. For up to 12 doses.  . pantoprazole (PROTONIX) 40 MG tablet Take 40 mg by mouth daily. (Patient not taking: Reported on 02/11/2021)  . potassium chloride (KLOR-CON) 10 MEQ tablet TAKE 1 TABLET BY MOUTH TWICE DAILY (Patient taking differently: Take 10 mEq by mouth 2 (two) times daily.)  . predniSONE (DELTASONE) 20 MG tablet Take 20 mg by mouth daily. For 5 days. (Patient not taking: Reported on 02/11/2021)  . QUEtiapine (SEROQUEL) 25 MG tablet Take 1 tablet by mouth at bedtime.  . traZODone (DESYREL) 50 MG tablet TAKE 1 AND 1/2 TABLET AT BEDTIME AS NEEDED FOR SLEEP (Patient taking differently: Take 75 mg by mouth at bedtime as needed for sleep.)  . TUSSIN DM 100-10 MG/5ML liquid TAKE 1 TEASPOONFUL BY MOUTH EVERY 4 HOURS AS NEEDED FOR COUGH  . vitamin E 1000 UNIT capsule Take 1,000 Units by mouth daily.     Objective:  Patient Care Plan: Fall Risk (Adult)    Problem Identified: Fall Risk     Long-Range Goal: Maintain Function /Absence of Fall and Fall-Related Injury   Start Date: 11/14/2020  Expected End Date: 03/16/2021  Priority: High  Note:    Current Barriers:  . Fall Risk r/t Impaired Vision and Impaired Gait  Clinical Goal(s):  Marland Kitchen Over the next 120 days, patient will not experience falls or require emergent care d/t fall related injuries.  Interventions:  . Collaboration with Jerrol Banana., MD regarding development and update of comprehensive plan of care as evidenced by provider attestation and co-signature . Inter-disciplinary care team collaboration (see longitudinal plan of care) . Discussed current status with patient's daughter/caregiver [name]. [name] Hall has experienced two falls in less than a week. She was evaluated in the ED following a fall on 5/15. She experienced another fall the following day which resulted in her current hospitalization. [name] expressed concerns regarding the pending hospital discharge. Reports during her conversation earlier today her mom expressed concerns regarding her ability to return home and ambulate independently. [name] reports the family remains very supportive, however they are concerned about her safety  if she is released and unable to ambulate without assistance or supervision. The family prefers that she be discharged to short term rehab. . Reviewed Chart. Per notations in Epic, she has been evaluated by Physical Therapy with recommendation to discharge home with home health PT. Nursing documentation later that evening indicated [name] Hall was refusing discharge d/t feeling unsafe discharging home. Also indicated she was unsteady getting out of bed to the bedside commode. [name] Hall the Inpatient Nurse Case Manager, [name] Hall to address concerns. Relayed the family's concerns regarding [name] Hall's safety if she is discharged home and unable to ambulate independently or without supervision. They prefer that she discharge to short term rehab. [name] Hall reports she is unable to submit for insurance approval to go to SNF due to the Physical Therapist's recommendation for  Home Health. Reports the family would need to pay out of pocket if they prefer that she discharge to short term rehab. Discussed most recent notation in chart indicating she was unsteady getting out of bed. Reports speaking to the physical therapist prior to the call but unable to verify if Mrs. Kinnard was reevaluated by the therapist today. Agreed to contact [name] to explain and discuss further.      Self-Care Deficits/Patient Goals:  -Utilize assistive device appropriately with all ambulation -Change positions slowly and wear non skid footwear when ambulating -Contact provider or the care management team with questions or new concerns   Follow Up Plan Pending discharge plan and disposition       PLAN Follow-up pending discharge plan and disposition     [name] [facility] [phone]

## 2021-02-11 NOTE — Progress Notes (Signed)
   02/11/21 1117  Assess: MEWS Score  Temp 98.5 F (36.9 C)  BP (!) 141/93  Pulse Rate (!) 140  Resp 16  SpO2 99 %  O2 Device Room Air  Assess: MEWS Score  MEWS Temp 0  MEWS Systolic 0  MEWS Pulse 3  MEWS RR 0  MEWS LOC 0  MEWS Score 3  MEWS Score Color Yellow  Assess: if the MEWS score is Yellow or Red  Were vital signs taken at a resting state? Yes  Focused Assessment No change from prior assessment  Early Detection of Sepsis Score *See Row Information* Low  MEWS guidelines implemented *See Row Information* Yes  Treat  Pain Scale 0-10  Pain Score 3  Notify: Charge Nurse/RN  Name of Charge Nurse/RN Notified Gerald Stabs RN  Date Charge Nurse/RN Notified 02/11/21  Time Charge Nurse/RN Notified 1117  Document  Patient Outcome Stabilized after interventions  Progress note created (see row info) Yes

## 2021-02-11 NOTE — TOC Progression Note (Signed)
Transition of Care [facility]) - Progression Note    Patient Details  Name: [name] MRN: [id number] Date of Birth: 1929-01-02  Transition of Care [facility]) CM/SW [place], RN Phone Number: 02/11/2021, 1:00 PM  Clinical Narrative:     Spoke with [name] the patient's daughter I explained that she did well with PT and that the recommendation is home with [facility], I explained that yes she would benefit from assistance at home but that the family would need to assist her, I explained that [facility] only comes out a couple of times per week and that [facility] has accepted the patient, she stated that [facility] told her they have approved 35 hours a week of home care, I explained that Home health agencies do not provide that type of care and that it may be something the insurance company could set up I encouraged her to call them back and get more information, She asked what to do if they are unable to get the patient in the home once they pick her up, I explained that she walked well with PT while here however if there are steps etc and they did not feel that they were able to get her in then I could set up non urgent transport but if insurance denied payment they would be responsible for the cost, she stated that Jeneen Rinks the son will pick up in about an hour or so, the nurse needs to review the DC with the son when he arrives   Expected Discharge Plan: Home w [place] Barriers to Discharge: Barriers Resolved  Expected Discharge Plan and Services Expected Discharge Plan: [place]   Discharge Planning Services: CM Consult     Expected Discharge Date: 02/10/21               DME Arranged: N/A         HH Arranged: PT HH Agency: [facility] Date HH Agency Contacted: 02/10/21 Time HH Agency Contacted: 1500 Representative spoke with at [place]: [place] ([place]) Interventions    Readmission Risk Interventions No flowsheet  data found.

## 2021-02-11 NOTE — Progress Notes (Addendum)
PROGRESS NOTE    [name]  [id number] DOB: 04/05/1929 DOA: 02/09/2021 PCP: Jerrol Banana., MD   Chief complaint.  Fall Brief Narrative:  Tammy L Honeycuttis a 87 y.o.femalewith medical history significant forParoxysmal A. fib not on anticoagulation, COPD with chronic cough, diabetes, COPD, CKD stage llla, chronic anemia, dementia with history of frequent falls, who presented to the emergency room for fall on 5/15 sustaining a laceration to her left ankle requiring sutures, who presented to the emergency room again on 5/16 following another fall at home.  She had a possible COVID, but did not have any hypoxemia or shortness of breath.  Chest x-ray had no pneumonia. 5/18.  Patient developed significant tachycardia today, EKG showed PSVT.  Adenosine is given.   Assessment & Plan:   Principal Problem:   Acute metabolic encephalopathy Active Problems:   Anemia   Benign essential HTN   Paroxysmal atrial fibrillation (HCC)   Chronic obstructive pulmonary disease (HCC)   Type II diabetes mellitus with renal manifestations (HCC)   Coronary artery disease involving native coronary artery of native heart   Frequent falls   COVID-19 virus infection   Chronic kidney disease, stage 3a (HCC)   Dementia (HCC)   Garbled speech, intermittent   Laceration without foreign body, left ankle, subsequent encounter   AMS (altered mental status)  #1.  PSVT. Paroxysmal atrial fibrillation. I reviewed the patient EKG, it appears to be PSVT.  Discussed with patient daughter, she was told to have history of atrial fibrillation, this could also be atrial flutter.  I will give adenosine, place patient on telemetry.  Also consult cardiology. Patient not currently on anticoagulation.  #2.  Frequent falls. Continue PT/OT. Currently PT does not recommend SNF placement.  3.  Altered mental status with dementia. Slurred speech. Condition has resolved, no evidence of acute  stroke.  4.  Laceration the left ankle. Follow-up with PCP as outpatient.  5. COVID-19 infection. No hypoxemia or pneumonia.  6.  Anemia. Check a B12 and iron level.  #7.  Chronic kidney disease stage IIIa. Hyponatremia. Reviewed previous labs, patient has chronic kidney disease stage IIIa.  Hyponatremia appears to be new.  She appears to have mild dehydration, will give gentle rehydration.  8.  Type 2 diabetes with chronic kidney disease stage IIIa. Continue to follow.   9.  Essential hypertension. Restart home medicines.  1500. HR slow downed enough to reveal atrial fib, will restart diltiazem, and prn metoprolol iv.  DVT prophylaxis: Lovenox Code Status: DNR Family Communication: Daughter updated. Disposition Plan:  .   Status is: Observation  The patient remains OBS appropriate and will d/c before 2 midnights.  Dispo: The patient is from: Home              Anticipated d/c is to: Home              Patient currently is not medically stable to d/c.   Difficult to place patient No        I/O last 3 completed shifts: In: 300 [IV Piggyback:300] Out: 1600 [Urine:1600] No intake/output data recorded.     Consultants:   cardiology  Procedures: None  Antimicrobials:None  Subjective: Patient developed tachycardia and unknown, heart rate was 140s.  Patient did not feel any short of breath, she has some mild palpitation. Denies any cough. No fever or chills. No abdominal pain or nausea vomiting. No dysuria hematuria. No headache or dizziness.  Objective: Vitals:   02/11/21 0600 02/11/21  0815 02/11/21 1117 02/11/21 1340  BP: (!) 171/123 (!) 190/73 (!) 141/93 (!) 127/95  Pulse: 96 87 (!) 140 99  Resp: 17 16 16 18   Temp: 98.7 F (37.1 C) 98.1 F (36.7 C) 98.5 F (36.9 C) 98.6 F (37 C)  TempSrc:      SpO2: 100% 97% 99% (!) 88%  Weight:      Height:        Intake/Output Summary (Last 24 hours) at 02/11/2021 1415 Last data filed at 02/11/2021  0103 Gross per 24 hour  Intake --  Output 1600 ml  Net -1600 ml   Filed Weights   02/09/21 2222  Weight: 66 kg    Examination:  General exam: Appears calm and comfortable  Respiratory system: Clear to auscultation. Respiratory effort normal. Cardiovascular system: Regular and tachycardic no JVD, murmurs, rubs, gallops or clicks. No pedal edema. Gastrointestinal system: Abdomen is nondistended, soft and nontender. No organomegaly or masses felt. Normal bowel sounds heard. Central nervous system: Alert and oriented x2. No focal neurological deficits. Extremities: Symmetric 5 x 5 power. Skin: No rashes, lesions or ulcers Psychiatry:  Mood & affect appropriate.     Data Reviewed: I have personally reviewed following labs and imaging studies  CBC: Recent Labs  Lab 02/08/21 1200 02/09/21 2225  WBC 7.3 11.7*  NEUTROABS 3.8 6.6  HGB 10.2* 8.5*  HCT 29.9* 25.3*  MCV 88.2 88.8  PLT 215 324   Basic Metabolic Panel: Recent Labs  Lab 02/08/21 1200 02/09/21 2225  NA 131* 130*  K 3.7 4.2  CL 99 98  CO2 22 22  GLUCOSE 325* 173*  BUN 22 29*  CREATININE 1.06* 1.34*  CALCIUM 7.9* 8.0*   GFR: Estimated Creatinine Clearance: 25.4 mL/min (A) (by C-G formula based on SCr of 1.34 mg/dL (H)). Liver Function Tests: Recent Labs  Lab 02/08/21 1200 02/09/21 2225  AST 19 17  ALT 14 13  ALKPHOS 65 63  BILITOT 0.5 0.6  PROT 5.5* 5.9*  ALBUMIN 2.9* 3.1*   No results for input(s): LIPASE, AMYLASE in the last 168 hours. No results for input(s): AMMONIA in the last 168 hours. Coagulation Profile: No results for input(s): INR, PROTIME in the last 168 hours. Cardiac Enzymes: No results for input(s): CKTOTAL, CKMB, CKMBINDEX, TROPONINI in the last 168 hours. BNP (last 3 results) No results for input(s): PROBNP in the last 8760 hours. HbA1C: No results for input(s): HGBA1C in the last 72 hours. CBG: Recent Labs  Lab 02/10/21 1255 02/10/21 1835 02/10/21 2143 02/11/21 1005  02/11/21 1252  GLUCAP 219* 104* 110* 165* 155*   Lipid Profile: No results for input(s): CHOL, HDL, LDLCALC, TRIG, CHOLHDL, LDLDIRECT in the last 72 hours. Thyroid Function Tests: No results for input(s): TSH, T4TOTAL, FREET4, T3FREE, THYROIDAB in the last 72 hours. Anemia Panel: Recent Labs    02/09/21 2354  FERRITIN 47   Sepsis Labs: Recent Labs  Lab 02/09/21 2354  PROCALCITON <0.10    No results found for this or any previous visit (from the past 240 hour(s)).       Radiology Studies: DG Forearm Right  Result Date: 02/09/2021 CLINICAL DATA:  Recent fall with forearm pain, initial encounter EXAM: RIGHT FOREARM - 2 VIEW COMPARISON:  None. FINDINGS: No acute fracture in the radius or ulna is seen. Atherosclerotic calcifications are noted in the vasculature. Degenerative changes about wrist joint are seen. IMPRESSION: No acute abnormality noted. Electronically Signed   By: Inez Catalina M.D.   On: 02/09/2021 23:42  CT Head Wo Contrast  Result Date: 02/09/2021 CLINICAL DATA:  Fall EXAM: CT HEAD WITHOUT CONTRAST CT CERVICAL SPINE WITHOUT CONTRAST TECHNIQUE: Multidetector CT imaging of the head and cervical spine was performed following the standard protocol without intravenous contrast. Multiplanar CT image reconstructions of the cervical spine were also generated. COMPARISON:  None. FINDINGS: CT HEAD FINDINGS Brain: No acute territorial infarction, hemorrhage or intracranial mass. Mild atrophy. Patchy white matter hypodensity consistent with chronic small vessel ischemic change. Vascular: No hyperdense vessels.  Carotid vascular calcification Skull: Normal. Negative for fracture or focal lesion. Sinuses/Orbits: No acute finding. Other: None CT CERVICAL SPINE FINDINGS Alignment: Trace anterolisthesis C3 on C4 and C4 on C5 likely degenerative. Facet alignment is maintained Skull base and vertebrae: No acute fracture. No primary bone lesion or focal pathologic process. Soft tissues and  spinal canal: No prevertebral fluid or swelling. No visible canal hematoma. Disc levels: Moderate disc space narrowing and degenerative change C5-C6. Facet degenerative changes at multiple levels with foraminal narrowing. Upper chest: Negative. Other: None IMPRESSION: 1. No CT evidence for acute intracranial abnormality. Atrophy and chronic small vessel ischemic change of the white matter 2. Degenerative changes. Trace anterolisthesis C3 on C4 and C4 on C5 likely due to degenerative change. No acute osseous abnormality. Electronically Signed   By: Donavan Foil M.D.   On: 02/09/2021 23:26   CT Cervical Spine Wo Contrast  Result Date: 02/09/2021 CLINICAL DATA:  Fall EXAM: CT HEAD WITHOUT CONTRAST CT CERVICAL SPINE WITHOUT CONTRAST TECHNIQUE: Multidetector CT imaging of the head and cervical spine was performed following the standard protocol without intravenous contrast. Multiplanar CT image reconstructions of the cervical spine were also generated. COMPARISON:  None. FINDINGS: CT HEAD FINDINGS Brain: No acute territorial infarction, hemorrhage or intracranial mass. Mild atrophy. Patchy white matter hypodensity consistent with chronic small vessel ischemic change. Vascular: No hyperdense vessels.  Carotid vascular calcification Skull: Normal. Negative for fracture or focal lesion. Sinuses/Orbits: No acute finding. Other: None CT CERVICAL SPINE FINDINGS Alignment: Trace anterolisthesis C3 on C4 and C4 on C5 likely degenerative. Facet alignment is maintained Skull base and vertebrae: No acute fracture. No primary bone lesion or focal pathologic process. Soft tissues and spinal canal: No prevertebral fluid or swelling. No visible canal hematoma. Disc levels: Moderate disc space narrowing and degenerative change C5-C6. Facet degenerative changes at multiple levels with foraminal narrowing. Upper chest: Negative. Other: None IMPRESSION: 1. No CT evidence for acute intracranial abnormality. Atrophy and chronic small  vessel ischemic change of the white matter 2. Degenerative changes. Trace anterolisthesis C3 on C4 and C4 on C5 likely due to degenerative change. No acute osseous abnormality. Electronically Signed   By: Donavan Foil M.D.   On: 02/09/2021 23:26   DG Chest Portable 1 View  Result Date: 02/09/2021 CLINICAL DATA:  Recent fall with chest pain, initial encounter EXAM: PORTABLE CHEST 1 VIEW COMPARISON:  07/24/2020 FINDINGS: Cardiac shadow is within normal limits. Aortic calcifications are again noted. Lungs are clear. No acute bony abnormality is seen. IMPRESSION: No acute abnormality noted. Electronically Signed   By: Inez Catalina M.D.   On: 02/09/2021 23:43   DG Shoulder Left  Result Date: 02/09/2021 CLINICAL DATA:  Recent fall with shoulder pain, initial encounter EXAM: LEFT SHOULDER - 2+ VIEW COMPARISON:  10/16/2020 FINDINGS: Degenerative changes of the acromioclavicular joint are noted. No acute fracture or dislocation is noted. No soft tissue abnormality is seen. IMPRESSION: No acute abnormality noted.  Degenerative changes are seen. Electronically Signed  By: Inez Catalina M.D.   On: 02/09/2021 23:41        Scheduled Meds: . adenosine (ADENOCARD) IV  3 mg Intravenous Once  . vitamin C  500 mg Oral Daily  . enoxaparin (LOVENOX) injection  30 mg Subcutaneous Q24H  . insulin aspart  0-15 Units Subcutaneous TID WC  . insulin aspart  0-5 Units Subcutaneous QHS  . isosorbide mononitrate  30 mg Oral Daily  . losartan  100 mg Oral Daily  . zinc sulfate  220 mg Oral Daily   Continuous Infusions:   LOS: 1 day    Time spent: 32 minutes    Sharen Hones, MD [facility]   To contact the attending provider between 7A-7P or the covering provider during after hours 7P-7A, please log into the web site [email or web address] and access using universal [place] password for that web site. If you do not have the password, please call the hospital operator.  02/11/2021, 2:15 PM

## 2021-02-11 NOTE — TOC Progression Note (Signed)
Transition of Care [facility]) - Progression Note    Patient Details  Name: [name] MRN: [id number] Date of Birth: 1929-08-11  Transition of Care [facility]) CM/SW [place], RN Phone Number: 02/11/2021, 2:08 PM  Clinical Narrative:   Nurse came to let me know that the patient DC has been cancelled due to elevated heart rate, I called her son Jeneen Rinks and her daughter Jones Broom I aslo let Jeneen Rinks know that when she does Discharge that she will need additional assistance at home, he agrees. He asked that we call him when it is time to DC her   Expected Discharge Plan: [place] Barriers to Discharge: Barriers Resolved  Expected Discharge Plan and Services Expected Discharge Plan: [place]   Discharge Planning Services: CM Consult     Expected Discharge Date: 02/11/21               DME Arranged: N/A         HH Arranged: PT HH Agency: [facility] [place] Date [place]: 02/10/21 Time HH Agency Contacted: 1500 Representative spoke with at [place]: [place] ([place]) Interventions    Readmission Risk Interventions No flowsheet data found.

## 2021-02-12 DIAGNOSIS — R296 Repeated falls: Secondary | ICD-10-CM | POA: Diagnosis not present

## 2021-02-12 DIAGNOSIS — S91012D Laceration without foreign body, left ankle, subsequent encounter: Secondary | ICD-10-CM | POA: Diagnosis not present

## 2021-02-12 DIAGNOSIS — I480 Paroxysmal atrial fibrillation: Secondary | ICD-10-CM

## 2021-02-12 DIAGNOSIS — I471 Supraventricular tachycardia: Secondary | ICD-10-CM | POA: Diagnosis not present

## 2021-02-12 DIAGNOSIS — G9341 Metabolic encephalopathy: Secondary | ICD-10-CM | POA: Diagnosis not present

## 2021-02-12 LAB — BASIC METABOLIC PANEL
Anion gap: 8 (ref 5–15)
BUN: 27 mg/dL — ABNORMAL HIGH (ref 8–23)
CO2: 26 mmol/L (ref 22–32)
Calcium: 8.1 mg/dL — ABNORMAL LOW (ref 8.9–10.3)
Chloride: 97 mmol/L — ABNORMAL LOW (ref 98–111)
Creatinine, Ser: 1.02 mg/dL — ABNORMAL HIGH (ref 0.44–1.00)
GFR, Estimated: 53 mL/min — ABNORMAL LOW (ref >60)
Glucose, Bld: 148 mg/dL — ABNORMAL HIGH (ref 70–99)
Potassium: 4 mmol/L (ref 3.5–5.1)
Sodium: 131 mmol/L — ABNORMAL LOW (ref 135–145)

## 2021-02-12 LAB — CBC WITH DIFFERENTIAL/PLATELET
Abs Immature Granulocytes: 0.09 10*3/uL — ABNORMAL HIGH (ref 0.00–0.07)
Basophils Absolute: 0 10*3/uL (ref 0.0–0.1)
Basophils Relative: 0 %
Eosinophils Absolute: 0.2 10*3/uL (ref 0.0–0.5)
Eosinophils Relative: 1 %
HCT: 24.1 % — ABNORMAL LOW (ref 36.0–46.0)
Hemoglobin: 8.3 g/dL — ABNORMAL LOW (ref 12.0–15.0)
Immature Granulocytes: 1 %
Lymphocytes Relative: 26 %
Lymphs Abs: 3.2 10*3/uL (ref 0.7–4.0)
MCH: 30.2 pg (ref 26.0–34.0)
MCHC: 34.4 g/dL (ref 30.0–36.0)
MCV: 87.6 fL (ref 80.0–100.0)
Monocytes Absolute: 1.6 10*3/uL — ABNORMAL HIGH (ref 0.1–1.0)
Monocytes Relative: 13 %
Neutro Abs: 7 10*3/uL (ref 1.7–7.7)
Neutrophils Relative %: 59 %
Platelets: 269 10*3/uL (ref 150–400)
RBC: 2.75 MIL/uL — ABNORMAL LOW (ref 3.87–5.11)
RDW: 14.6 % (ref 11.5–15.5)
WBC: 12 10*3/uL — ABNORMAL HIGH (ref 4.0–10.5)
nRBC: 0 % (ref 0.0–0.2)

## 2021-02-12 LAB — GLUCOSE, CAPILLARY
Glucose-Capillary: 338 mg/dL — ABNORMAL HIGH (ref 70–99)
Glucose-Capillary: 305 mg/dL — ABNORMAL HIGH (ref 70–99)

## 2021-02-12 LAB — MAGNESIUM: Magnesium: 1.7 mg/dL (ref 1.7–2.4)

## 2021-02-12 MED ORDER — VITAMIN B-12 1000 MCG PO TABS
1000.0000 ug | ORAL_TABLET | Freq: Every day | ORAL | Status: DC
  Administered 2021-02-12: 1000 ug via ORAL
  Filled 2021-02-12: qty 1

## 2021-02-12 MED ORDER — POLYSACCHARIDE IRON COMPLEX 150 MG PO CAPS
150.0000 mg | ORAL_CAPSULE | Freq: Every day | ORAL | Status: DC
  Administered 2021-02-12: 150 mg via ORAL
  Filled 2021-02-12: qty 1

## 2021-02-12 MED ORDER — POLYSACCHARIDE IRON COMPLEX 150 MG PO CAPS: 150.0000 mg | ORAL_CAPSULE | Freq: Every day | ORAL | 0 refills | Status: DC

## 2021-02-12 MED ORDER — CYANOCOBALAMIN 1000 MCG PO TABS: 1000.0000 ug | ORAL_TABLET | Freq: Every day | ORAL | 0 refills | Status: DC

## 2021-02-12 NOTE — TOC Progression Note (Signed)
Transition of Care [facility]) - Progression Note    Patient Details  Name: [name] MRN: [id number] Date of Birth: 22-Jun-1929  Transition of Care [facility]) CM/SW Contact  Su Hilt, RN Phone Number: 02/12/2021, 2:51 PM  Clinical Narrative:    Karel Jarvis the patient's son, Let him know that the patient has done better this time working with PT however we still recommend someone being with her as much as possible, I let him know the patient wants her son to call because she wants to tell him which clothes to bring her, he stated that he will call her and then head this way to get her   Expected Discharge Plan: [place] Barriers to Discharge: Barriers Resolved  Expected Discharge Plan and Services Expected Discharge Plan: [place]   Discharge Planning Services: CM Consult     Expected Discharge Date: 02/12/21               DME Arranged: N/A         HH Arranged: PT [facility] Agency[facility] Date [place] Agency Contacted: 02/10/21 Time HH Agency Contacted: 1500 Representative spoke with at [place]: [place] ([place]) Interventions    Readmission Risk Interventions No flowsheet data found.

## 2021-02-12 NOTE — Plan of Care (Signed)
Adequate for discharge.

## 2021-02-12 NOTE — TOC Progression Note (Addendum)
Transition of Care [facility]) - Progression Note    Patient Details  Name: [name] MRN: [id number] Date of Birth: April 02, 1929  Transition of Care [facility]) CM/SW [place], RN Phone Number: 02/12/2021, 10:15 AM  Clinical Narrative:    Karel Jarvis and explained that the nurse will review the DC paper work with him when he gets here, He is requesting that PT work with her prior to DC, the patient does have a life alert in place, and her children check on her however she lives alone.She does have 35 hours a week approved with her insurance company for a nurse aide, I encouraged the son to call the insurance company to find out thru what company, I do not have a resource for that kind of care. He agrees to call [employer] and find out to get it set up.   Expected Discharge Plan: [place] Barriers to Discharge: Barriers Resolved  Expected Discharge Plan and Services Expected Discharge Plan: [place]   Discharge Planning Services: CM Consult     Expected Discharge Date: 02/12/21               DME Arranged: N/A         HH Arranged: PT HH Agency: [facility] [place] Date [place]: 02/10/21 Time [place] Agency Contacted: 1500 Representative spoke with at [place]: [place]   Social Determinants of Health ([place]) Interventions    Readmission Risk Interventions No flowsheet data found.

## 2021-02-12 NOTE — Discharge Summary (Signed)
Physician Discharge Summary  Patient ID: ROBERT SUNGA MRN: [id number] DOB/AGE: 1929-08-21 87 y.o.  Admit date: 02/09/2021 Discharge date: 02/12/2021  Admission Diagnoses:  Discharge Diagnoses:  Principal Problem:   Acute metabolic encephalopathy Active Problems:   Anemia   Benign essential HTN   Hyponatremia   Paroxysmal atrial fibrillation (HCC)   Chronic obstructive pulmonary disease (HCC)   Type II diabetes mellitus with renal manifestations (HCC)   Coronary artery disease involving native coronary artery of native heart   Frequent falls   COVID-19 virus infection   Chronic kidney disease, stage 3a (HCC)   Dementia (HCC)   Garbled speech, intermittent   Laceration without foreign body, left ankle, subsequent encounter   AMS (altered mental status)   Discharged Condition: fair  Hospital Course:  Glenola L Honeycuttis a 87 y.o.femalewith medical history significant forParoxysmal A. fib not on anticoagulation, COPD with chronic cough, diabetes, COPD, CKD stage llla, chronic anemia, dementia with history of frequent falls, who presented to the emergency room for fall on 5/15 sustaining a laceration to her left ankle requiring sutures, who presentedto the emergency room again on 5/16 following another fall at home.  She had a possible COVID, but did not have any hypoxemia or shortness of breath.  Chest x-ray had no pneumonia. 5/18.  Patient developed significant tachycardia today, EKG seems to be PSVT.    Reviewed atrial fibrillation with RVR after adenosine was given.  #1. Paroxysmal atrial fibrillation with RVR Initially thought it was PSVT, but later determined to be atrial fibrillation with RVR.  She received IV diltiazem, converted to sinus yesterday.  Cardiology has seen the patient, patient is not a candidate for anticoagulation due to frequent falls.  Patient does not have any evidence of volume overload today, she is medically stable to be discharged. Continue  diltiazem.  #2.  Frequent falls. Continue PT/OT. Currently PT does not recommend SNF placement.  3.  Altered mental status with dementia. Slurred speech. Condition has resolved, no evidence of acute stroke.  4.  Laceration the left ankle. Follow-up with PCP as outpatient. Needed suture removal 02/19/2021.  5. COVID-19 infection. No hypoxemia or pneumonia.  6.  Anemia. Patient has some iron deficiency, started oral iron. B12 level borderline, homocystine level pending.  Start B12 supplement.  May continue if her homocystine is elevated.  #7.  Chronic kidney disease stage IIIa. Hyponatremia. Reviewed previous labs, patient has chronic kidney disease stage IIIa.  Hyponatremia appears to be new.  She appears to have mild dehydration, he received fluids.  Renal function is better.  Sodium level 131.   8.  Type 2 diabetes with chronic kidney disease stage IIIa. Follow-up with PCP to adjust medications.   9.  Essential hypertension. Restart home medicines.   Consults: cardiology  Significant Diagnostic Studies:   Treatments: IV diltiazem  Discharge Exam: Blood pressure 130/69, pulse 84, temperature 98.7 F (37.1 C), resp. rate 16, height 5\' 2"  (1.575 m), weight 66 kg, SpO2 98 %. General appearance: alert and cooperative Resp: clear to auscultation bilaterally Cardio: regular rate and rhythm, S1, S2 normal, no murmur, click, rub or gallop GI: soft, non-tender; bowel sounds normal; no masses,  no organomegaly Extremities: extremities normal, atraumatic, no cyanosis or edema  Disposition: Discharge disposition: 01-Home or Self Care       Discharge Instructions    Diet - low sodium heart healthy   Complete by: As directed    Diet - low sodium heart healthy   Complete by: As directed  Discharge instructions   Complete by: As directed    You blood pressure has been low normal to normal in the hospital without your home blood pressure medications, so please  hold your Diltizem and half your Losartan to 50 mg daily and follow up with your primary care doctor for further adjustment.  You don't have any more symptoms from your prior COVID infection, so you don't need more treatment for it.  Physical therapy worked with you and cleared for you to go home.  We will order home health physical therapy for you.  Please always walk with a walker.   Dr. Enzo Bi - -   Discharge wound care:   Complete by: As directed    Per prior home dressing instruction.   Discharge wound care:   Complete by: As directed    Keep clean, follow with PCP in 1 week. Visiting RN for dress change if needed   Discharge wound care:   Complete by: As directed    Follow with RN, and PCP for suture removal.   Increase activity slowly   Complete by: As directed    Increase activity slowly   Complete by: As directed      Allergies as of 02/12/2021      Reactions   Bacitracin-neomycin-polymyxin Rash   Neomycin-bacitracin Zn-polymyx Swelling, Rash   Latex Rash   Lidocaine Rash   Aricept [donepezil Hcl] Diarrhea   Benzalkonium Chloride Itching, Swelling   Ibuprofen    Other reaction(s): Dizziness Heart fluttering. Tachycardia. Tachycardia.   Valdecoxib Nausea And Vomiting   Albuterol Rash   Tape Rash   blisters   Triamcinolone Rash      Medication List    STOP taking these medications   doxycycline 100 MG tablet Commonly known as: VIBRA-TABS   potassium chloride 10 MEQ tablet Commonly known as: KLOR-CON   predniSONE 20 MG tablet Commonly known as: DELTASONE   vitamin C 1000 MG tablet     TAKE these medications   Advair HFA 230-21 MCG/ACT inhaler Generic drug: fluticasone-salmeterol TAKE 2 PUFFS INTO LUNGS TWICE A DAY What changed: See the new instructions.   albuterol 108 (90 Base) MCG/ACT inhaler Commonly known as: VENTOLIN HFA INHALE 2 PUFFS INTO THE LUNGS EVERY 4 HOURS AS NEEDED FOR WHEEZING ORSHORTNESS OF BREATH What changed:   how much  to take  how to take this  when to take this  reasons to take this   aspirin 325 MG tablet Take 325 mg by mouth daily. Reported on 01/05/2016   atorvastatin 10 MG tablet Commonly known as: Lipitor Take 1 tablet (10 mg total) by mouth daily.   benzonatate 200 MG capsule Commonly known as: TESSALON Take 200 mg by mouth 3 (three) times daily as needed for cough. For up to 7 days.   cetirizine 10 MG tablet Commonly known as: ZYRTEC TAKE ONE TABLET BY MOUTH EVERY DAY   cholecalciferol 25 MCG (1000 UNIT) tablet Commonly known as: VITAMIN D3 Take 1,000 Units by mouth daily.   clopidogrel 75 MG tablet Commonly known as: PLAVIX TAKE 1 TABLET BY MOUTH DAILY   cyanocobalamin 1000 MCG tablet Take 1 tablet (1,000 mcg total) by mouth daily. Start taking on: Feb 13, 2021   diltiazem 360 MG 24 hr capsule Commonly known as: Tiadylt ER Hold until followup with your outpatient doctor due to your blood pressure on the low side. What changed:   how much to take  how to take this  when to take  this  additional instructions   ezetimibe 10 MG tablet Commonly known as: ZETIA TAKE 1 TABLET BY MOUTH DAILY   FLUoxetine 20 MG capsule Commonly known as: PROZAC TAKE 1 CAPSULE EVERY DAY   glucose blood test strip ACCU-CHEK AVIVA PLUS TEST STRP Use to check sugars 3 times daily.   insulin glargine 100 UNIT/ML injection Commonly known as: Lantus Inject 0.05 mLs (5 Units total) into the skin at bedtime. What changed: Another medication with the same name was removed. Continue taking this medication, and follow the directions you see here.   insulin lispro 100 UNIT/ML KiwkPen Commonly known as: HUMALOG Inject 10 Units into the skin 3 (three) times daily. 10 units before breakfast, 8 before lunch and 18 before supper   iron polysaccharides 150 MG capsule Commonly known as: NIFEREX Take 1 capsule (150 mg total) by mouth daily. Start taking on: Feb 13, 2021   isosorbide mononitrate  30 MG 24 hr tablet Commonly known as: IMDUR Take 30 mg by mouth daily.   lansoprazole 30 MG capsule Commonly known as: PREVACID TAKE 1 CAPSULE BY MOUTH ONCE DAILY AT NOON What changed:   how much to take  how to take this  when to take this   losartan 100 MG tablet Commonly known as: COZAAR Take 0.5 tablets (50 mg total) by mouth daily. This is a decrease from 100 mg daily. What changed:   how much to take  additional instructions   Magnesium 400 MG Caps Take by mouth daily.   magnesium oxide 400 MG tablet Commonly known as: MAG-OX Take 400 mg by mouth daily.   meloxicam 7.5 MG tablet Commonly known as: MOBIC Take 1 tablet (7.5 mg total) by mouth daily as needed for pain.   memantine 5 MG tablet Commonly known as: NAMENDA TAKE ONE TABLET TWICE DAILY   montelukast 10 MG tablet Commonly known as: SINGULAIR TAKE ONE TABLET AT BEDTIME   ondansetron 4 MG disintegrating tablet Commonly known as: ZOFRAN-ODT Take 4 mg by mouth every 8 (eight) hours as needed for nausea. For up to 12 doses.   pantoprazole 40 MG tablet Commonly known as: PROTONIX Take 40 mg by mouth daily.   QUEtiapine 25 MG tablet Commonly known as: SEROQUEL Take 1 tablet by mouth at bedtime.   traZODone 50 MG tablet Commonly known as: DESYREL TAKE 1 AND 1/2 TABLET AT BEDTIME AS NEEDED FOR SLEEP What changed: See the new instructions.   Tussin DM 10-100 MG/5ML liquid Generic drug: Dextromethorphan-guaiFENesin TAKE 1 TEASPOONFUL BY MOUTH EVERY 4 HOURS AS NEEDED FOR COUGH   vitamin E 1000 UNIT capsule Take 1,000 Units by mouth daily.            Discharge Care Instructions  (From admission, onward)         Start     Ordered   02/12/21 0000  Discharge wound care:       Comments: Follow with RN, and PCP for suture removal.   02/12/21 1006   02/11/21 0000  Discharge wound care:       Comments: Keep clean, follow with PCP in 1 week. Visiting RN for dress change if needed   02/11/21  1334   02/10/21 0000  Discharge wound care:       Comments: Per prior home dressing instruction.   02/10/21 1420          Follow-up Information    Jerrol Banana., MD. Schedule an appointment as soon as possible for a visit  in 1 week(s).   Specialty: Family Medicine Contact information: [address] [place] [address] [phone]               Signed: Sharen Hones 02/12/2021, 10:07 AM

## 2021-02-12 NOTE — Progress Notes (Signed)
Physical Therapy Treatment Patient Details Name: [name] MRN: [id number] DOB: 1929/05/01 Today's Date: 02/12/2021    History of Present Illness Pt is an 87 y.o. female with medical history significant for Paroxysmal A. fib not on anticoagulation, COPD with chronic cough, diabetes, COPD, CKD stage llla, chronic anemia, dementia with history of frequent falls, recently diagnosed with COVID on 5/13 after becoming symptomatic for cough and weakness, started on prednisone on 5/15 by her PCP, and who presented to the emergency room for fall on 5/15 sustaining a laceration to her left ankle requiring sutures, who presents to the emergency room again on 5/16 following another fall at home.  On this occasion her daughter noticed that she had intermittent garbled speech and was concerned that it could be either a stroke or a side effect of having started prednisone.  MD assessment includes: Acute metabolic encephalopathy in the setting of dementia, CT of head negative, frequent falls, Covid-19, left ankle laceration, and acute on chronic anemia.    PT Comments    Pt was pleasant and motivated to participate during the session and overall performed well throughout.  Pt was steady with transfers with good eccentric and concentric control.  Pt was able to amb 1 x 50' with mod L ankle pain but after a short seated therapeutic rest break the pt was able to amb 1 x 100' with improved cadence and with decreased reported L ankle pain.  Pt given cues for upright posture, amb closer to the RW, and for decreased lean on the RW with pt reporting feeing that she could ambulate further with less fatigue.  Pt making good progress towards goals and will benefit from HHPT upon discharge to safely address deficits listed in patient problem list for decreased caregiver assistance and eventual return to PLOF.      Follow Up Recommendations  Home health PT;Supervision for mobility/OOB     Equipment Recommendations   None recommended by PT    Recommendations for Other Services       Precautions / Restrictions Precautions Precautions: Fall Restrictions Weight Bearing Restrictions: No    Mobility  Bed Mobility               General bed mobility comments: NT, pt in recliner    Transfers Overall transfer level: Needs assistance Equipment used: Rolling walker (2 wheeled) Transfers: Sit to/from Stand Sit to Stand: Supervision         General transfer comment: Good eccentric and concentric control and stability  Ambulation/Gait Ambulation/Gait assistance: Min guard Gait Distance (Feet): 50 Feet x 1, 100 Feet x 1 Assistive device: Rolling walker (2 wheeled) Gait Pattern/deviations: Step-through pattern;Antalgic;Decreased stance time - left;Decreased step length - right Gait velocity: decreased   General Gait Details: Antalgic on the LLE but improved grossly as gait progressed; pt steady with amb with min to mod verbal cues for amb closer to the RW with upright posture   Stairs             Wheelchair Mobility    Modified Rankin (Stroke Patients Only)       Balance Overall balance assessment: Needs assistance;History of Falls   Sitting balance-Leahy Scale: Good     Standing balance support: Bilateral upper extremity supported;During functional activity Standing balance-Leahy Scale: Good Standing balance comment: Min lean on the RW for support  Cognition Arousal/Alertness: Awake/alert Behavior During Therapy: WFL for tasks assessed/performed Overall Cognitive Status: Within Functional Limits for tasks assessed                                        Exercises Total Joint Exercises Ankle Circles/Pumps: AROM;Strengthening;Right;10 reps Quad Sets: Strengthening;Both;10 reps Gluteal Sets: Strengthening;Both;10 reps Hip ABduction/ADduction: Strengthening;Both;5 reps Straight Leg Raises: Both;Strengthening;10  reps Long Arc Quad: Strengthening;Both;15 reps Knee Flexion: 15 reps;Both;Strengthening Other Exercises Other Exercises: HEP education for BLE APs, QS, and GS x 10 each every 1-2 hours    General Comments        Pertinent Vitals/Pain Pain Assessment: 0-10 Pain Score: 4  Pain Location: No pain at rest, 4/10 L ankle pain with WB but improved as session progressed Pain Descriptors / Indicators: Sore;Aching Pain Intervention(s): Premedicated before session;Monitored during session    Home Living                      Prior Function            PT Goals (current goals can now be found in the care plan section) Progress towards PT goals: Progressing toward goals    Frequency    Min 2X/week      PT Plan Current plan remains appropriate    Co-evaluation              AM-PAC PT "6 Clicks" Mobility   Outcome Measure  Help needed turning from your back to your side while in a flat bed without using bedrails?: A Little Help needed moving from lying on your back to sitting on the side of a flat bed without using bedrails?: A Little Help needed moving to and from a bed to a chair (including a wheelchair)?: A Little Help needed standing up from a chair using your arms (e.g., wheelchair or bedside chair)?: A Little Help needed to walk in hospital room?: A Little Help needed climbing 3-5 steps with a railing? : A Little 6 Click Score: 18    End of Session Equipment Utilized During Treatment: Gait belt Activity Tolerance: Patient tolerated treatment well Patient left: in chair;with call bell/phone within reach;with chair alarm set Nurse Communication: Mobility status PT Visit Diagnosis: History of falling (Z91.81);Unsteadiness on feet (R26.81);Repeated falls (R29.6);Difficulty in walking, not elsewhere classified (R26.2);Pain;Muscle weakness (generalized) (M62.81) Pain - Right/Left: Left Pain - part of body: Ankle and joints of foot     Time: 1414-1440 PT Time  Calculation (min) (ACUTE ONLY): 26 min  Charges:  $Gait Training: 8-22 mins $Therapeutic Exercise: 8-22 mins                     D. Scott [name] PT, DPT 02/12/21, 3:08 PM

## 2021-02-13 LAB — HOMOCYSTEINE: Homocysteine: 9.6 umol/L (ref 0.0–21.3)

## 2021-02-14 DIAGNOSIS — Z7951 Long term (current) use of inhaled steroids: Secondary | ICD-10-CM | POA: Diagnosis not present

## 2021-02-14 DIAGNOSIS — S91012D Laceration without foreign body, left ankle, subsequent encounter: Secondary | ICD-10-CM | POA: Diagnosis not present

## 2021-02-14 DIAGNOSIS — Z7902 Long term (current) use of antithrombotics/antiplatelets: Secondary | ICD-10-CM | POA: Diagnosis not present

## 2021-02-14 DIAGNOSIS — D631 Anemia in chronic kidney disease: Secondary | ICD-10-CM | POA: Diagnosis not present

## 2021-02-14 DIAGNOSIS — E1142 Type 2 diabetes mellitus with diabetic polyneuropathy: Secondary | ICD-10-CM | POA: Diagnosis not present

## 2021-02-14 DIAGNOSIS — E7800 Pure hypercholesterolemia, unspecified: Secondary | ICD-10-CM | POA: Diagnosis not present

## 2021-02-14 DIAGNOSIS — F32A Depression, unspecified: Secondary | ICD-10-CM | POA: Diagnosis not present

## 2021-02-14 DIAGNOSIS — I2510 Atherosclerotic heart disease of native coronary artery without angina pectoris: Secondary | ICD-10-CM | POA: Diagnosis not present

## 2021-02-14 DIAGNOSIS — K219 Gastro-esophageal reflux disease without esophagitis: Secondary | ICD-10-CM | POA: Diagnosis not present

## 2021-02-14 DIAGNOSIS — N1831 Chronic kidney disease, stage 3a: Secondary | ICD-10-CM | POA: Diagnosis not present

## 2021-02-14 DIAGNOSIS — G4700 Insomnia, unspecified: Secondary | ICD-10-CM | POA: Diagnosis not present

## 2021-02-14 DIAGNOSIS — I129 Hypertensive chronic kidney disease with stage 1 through stage 4 chronic kidney disease, or unspecified chronic kidney disease: Secondary | ICD-10-CM | POA: Diagnosis not present

## 2021-02-14 DIAGNOSIS — U071 COVID-19: Secondary | ICD-10-CM | POA: Diagnosis not present

## 2021-02-14 DIAGNOSIS — M5136 Other intervertebral disc degeneration, lumbar region: Secondary | ICD-10-CM | POA: Diagnosis not present

## 2021-02-14 DIAGNOSIS — I480 Paroxysmal atrial fibrillation: Secondary | ICD-10-CM | POA: Diagnosis not present

## 2021-02-14 DIAGNOSIS — R1310 Dysphagia, unspecified: Secondary | ICD-10-CM | POA: Diagnosis not present

## 2021-02-14 DIAGNOSIS — E1122 Type 2 diabetes mellitus with diabetic chronic kidney disease: Secondary | ICD-10-CM | POA: Diagnosis not present

## 2021-02-14 DIAGNOSIS — J449 Chronic obstructive pulmonary disease, unspecified: Secondary | ICD-10-CM | POA: Diagnosis not present

## 2021-02-14 DIAGNOSIS — Z794 Long term (current) use of insulin: Secondary | ICD-10-CM | POA: Diagnosis not present

## 2021-02-14 DIAGNOSIS — E1151 Type 2 diabetes mellitus with diabetic peripheral angiopathy without gangrene: Secondary | ICD-10-CM | POA: Diagnosis not present

## 2021-02-14 DIAGNOSIS — Z7982 Long term (current) use of aspirin: Secondary | ICD-10-CM | POA: Diagnosis not present

## 2021-02-15 DIAGNOSIS — I480 Paroxysmal atrial fibrillation: Secondary | ICD-10-CM | POA: Diagnosis not present

## 2021-02-15 DIAGNOSIS — S91012D Laceration without foreign body, left ankle, subsequent encounter: Secondary | ICD-10-CM | POA: Diagnosis not present

## 2021-02-15 DIAGNOSIS — R1310 Dysphagia, unspecified: Secondary | ICD-10-CM | POA: Diagnosis not present

## 2021-02-15 DIAGNOSIS — Z794 Long term (current) use of insulin: Secondary | ICD-10-CM | POA: Diagnosis not present

## 2021-02-15 DIAGNOSIS — M5136 Other intervertebral disc degeneration, lumbar region: Secondary | ICD-10-CM | POA: Diagnosis not present

## 2021-02-15 DIAGNOSIS — D631 Anemia in chronic kidney disease: Secondary | ICD-10-CM | POA: Diagnosis not present

## 2021-02-15 DIAGNOSIS — I129 Hypertensive chronic kidney disease with stage 1 through stage 4 chronic kidney disease, or unspecified chronic kidney disease: Secondary | ICD-10-CM | POA: Diagnosis not present

## 2021-02-15 DIAGNOSIS — E7800 Pure hypercholesterolemia, unspecified: Secondary | ICD-10-CM | POA: Diagnosis not present

## 2021-02-15 DIAGNOSIS — J449 Chronic obstructive pulmonary disease, unspecified: Secondary | ICD-10-CM | POA: Diagnosis not present

## 2021-02-15 DIAGNOSIS — Z7902 Long term (current) use of antithrombotics/antiplatelets: Secondary | ICD-10-CM | POA: Diagnosis not present

## 2021-02-15 DIAGNOSIS — G4700 Insomnia, unspecified: Secondary | ICD-10-CM | POA: Diagnosis not present

## 2021-02-15 DIAGNOSIS — E1151 Type 2 diabetes mellitus with diabetic peripheral angiopathy without gangrene: Secondary | ICD-10-CM | POA: Diagnosis not present

## 2021-02-15 DIAGNOSIS — E1122 Type 2 diabetes mellitus with diabetic chronic kidney disease: Secondary | ICD-10-CM | POA: Diagnosis not present

## 2021-02-15 DIAGNOSIS — E1142 Type 2 diabetes mellitus with diabetic polyneuropathy: Secondary | ICD-10-CM | POA: Diagnosis not present

## 2021-02-15 DIAGNOSIS — U071 COVID-19: Secondary | ICD-10-CM | POA: Diagnosis not present

## 2021-02-15 DIAGNOSIS — K219 Gastro-esophageal reflux disease without esophagitis: Secondary | ICD-10-CM | POA: Diagnosis not present

## 2021-02-15 DIAGNOSIS — Z7951 Long term (current) use of inhaled steroids: Secondary | ICD-10-CM | POA: Diagnosis not present

## 2021-02-15 DIAGNOSIS — I2510 Atherosclerotic heart disease of native coronary artery without angina pectoris: Secondary | ICD-10-CM | POA: Diagnosis not present

## 2021-02-15 DIAGNOSIS — F32A Depression, unspecified: Secondary | ICD-10-CM | POA: Diagnosis not present

## 2021-02-15 DIAGNOSIS — N1831 Chronic kidney disease, stage 3a: Secondary | ICD-10-CM | POA: Diagnosis not present

## 2021-02-15 DIAGNOSIS — Z7982 Long term (current) use of aspirin: Secondary | ICD-10-CM | POA: Diagnosis not present

## 2021-02-16 ENCOUNTER — Telehealth: Payer: Self-pay | Admitting: Family Medicine

## 2021-02-16 NOTE — Patient Instructions (Signed)
Thank you for allowing the Chronic Care Management team to participate in your care.    Goal Addressed: Patient Care Plan: Fall Risk (Adult)    Problem Identified: Fall Risk     Long-Range Goal: Maintain Function /Absence of Fall and Fall-Related Injury   Start Date: 11/14/2020  Expected End Date: 03/16/2021  Priority: High  Note:   Current Barriers:  . Fall Risk r/t Impaired Vision and Impaired Gait  Clinical Goal(s):  Marland Kitchen Over the next 120 days, patient will not experience falls or require emergent care d/t fall related injuries.  Interventions:  . Collaboration with Jerrol Banana., MD regarding development and update of comprehensive plan of care as evidenced by provider attestation and co-signature . Inter-disciplinary care team collaboration (see longitudinal plan of care) . Discussed current status with patient's daughter/caregiver Tammy. Mrs. Nicolaou has experienced two falls in less than a week. She was evaluated in the ED following a fall on 5/15. She experienced another fall the following day which resulted in her current hospitalization. Tammy expressed concerns regarding the pending hospital discharge. Reports during her conversation earlier today her mom expressed concerns regarding her ability to return home and ambulate independently. Tammy reports the family remains very supportive, however they are concerned about her safety if she is released and unable to ambulate without assistance or supervision. The family prefers that she be discharged to short term rehab. . Reviewed Chart. Per notations in Epic, she has been evaluated by Physical Therapy with recommendation to discharge home with home health PT. Nursing documentation later that evening indicated Mrs. Greening was refusing discharge d/t feeling unsafe discharging home. Also indicated she was unsteady getting out of bed to the bedside commode. Kandice Robinsons the Inpatient Nurse Case Manager, Lianne Cure to  address concerns. Relayed the family's concerns regarding Mrs. Peery's safety if she is discharged home and unable to ambulate independently or without supervision. They prefer that she discharge to short term rehab. Delilah reports she is unable to submit for insurance approval to go to SNF due to the Physical Therapist's recommendation for Home Health. Reports the family would need to pay out of pocket if they prefer that she discharge to short term rehab. Discussed most recent notation in chart indicating she was unsteady getting out of bed. Reports speaking to the physical therapist prior to the call but unable to verify if Mrs. Kulpa was reevaluated by the therapist today. Agreed to contact [name] to explain and discuss further.      Self-Care Deficits/Patient Goals:  -Utilize assistive device appropriately with all ambulation -Change positions slowly and wear non skid footwear when ambulating -Contact provider or the care management team with questions or new concerns   Follow Up Plan Pending discharge plan and disposition        Mrs. Mccrumb's daughter Cherre Huger verbalized understanding of the information discussed during the telephonic outreach today. Declined need for mailed/printed instructions. Follow-up pending discharge plan and disposition     [facility] [phone]

## 2021-02-16 NOTE — Telephone Encounter (Signed)
Copied from [place] [id number]. Topic: Quick Communication - Home Health Verbal Orders >> Feb 16, 2021  8:23 AM Jodie Echevaria wrote: Caller/Agency: [place] Number: [phone] [facility]  Requesting OT/PT/Skilled Nursing/Social Work/Speech Therapy: OT  Frequency: 2 w 2, 1 w 3

## 2021-02-16 NOTE — Telephone Encounter (Signed)
ok 

## 2021-02-16 NOTE — Telephone Encounter (Signed)
Verbal okay given.  

## 2021-02-17 DIAGNOSIS — K219 Gastro-esophageal reflux disease without esophagitis: Secondary | ICD-10-CM | POA: Diagnosis not present

## 2021-02-17 DIAGNOSIS — U071 COVID-19: Secondary | ICD-10-CM | POA: Diagnosis not present

## 2021-02-17 DIAGNOSIS — Z7951 Long term (current) use of inhaled steroids: Secondary | ICD-10-CM | POA: Diagnosis not present

## 2021-02-17 DIAGNOSIS — F32A Depression, unspecified: Secondary | ICD-10-CM | POA: Diagnosis not present

## 2021-02-17 DIAGNOSIS — N1831 Chronic kidney disease, stage 3a: Secondary | ICD-10-CM | POA: Diagnosis not present

## 2021-02-17 DIAGNOSIS — R1310 Dysphagia, unspecified: Secondary | ICD-10-CM | POA: Diagnosis not present

## 2021-02-17 DIAGNOSIS — I2510 Atherosclerotic heart disease of native coronary artery without angina pectoris: Secondary | ICD-10-CM | POA: Diagnosis not present

## 2021-02-17 DIAGNOSIS — E1142 Type 2 diabetes mellitus with diabetic polyneuropathy: Secondary | ICD-10-CM | POA: Diagnosis not present

## 2021-02-17 DIAGNOSIS — I480 Paroxysmal atrial fibrillation: Secondary | ICD-10-CM | POA: Diagnosis not present

## 2021-02-17 DIAGNOSIS — I129 Hypertensive chronic kidney disease with stage 1 through stage 4 chronic kidney disease, or unspecified chronic kidney disease: Secondary | ICD-10-CM | POA: Diagnosis not present

## 2021-02-17 DIAGNOSIS — E1151 Type 2 diabetes mellitus with diabetic peripheral angiopathy without gangrene: Secondary | ICD-10-CM | POA: Diagnosis not present

## 2021-02-17 DIAGNOSIS — G4700 Insomnia, unspecified: Secondary | ICD-10-CM | POA: Diagnosis not present

## 2021-02-17 DIAGNOSIS — E7800 Pure hypercholesterolemia, unspecified: Secondary | ICD-10-CM | POA: Diagnosis not present

## 2021-02-17 DIAGNOSIS — D631 Anemia in chronic kidney disease: Secondary | ICD-10-CM | POA: Diagnosis not present

## 2021-02-17 DIAGNOSIS — S91012D Laceration without foreign body, left ankle, subsequent encounter: Secondary | ICD-10-CM | POA: Diagnosis not present

## 2021-02-17 DIAGNOSIS — Z7902 Long term (current) use of antithrombotics/antiplatelets: Secondary | ICD-10-CM | POA: Diagnosis not present

## 2021-02-17 DIAGNOSIS — Z794 Long term (current) use of insulin: Secondary | ICD-10-CM | POA: Diagnosis not present

## 2021-02-17 DIAGNOSIS — Z7982 Long term (current) use of aspirin: Secondary | ICD-10-CM | POA: Diagnosis not present

## 2021-02-17 DIAGNOSIS — M5136 Other intervertebral disc degeneration, lumbar region: Secondary | ICD-10-CM | POA: Diagnosis not present

## 2021-02-17 DIAGNOSIS — E1122 Type 2 diabetes mellitus with diabetic chronic kidney disease: Secondary | ICD-10-CM | POA: Diagnosis not present

## 2021-02-17 DIAGNOSIS — J449 Chronic obstructive pulmonary disease, unspecified: Secondary | ICD-10-CM | POA: Diagnosis not present

## 2021-02-18 DIAGNOSIS — N1831 Chronic kidney disease, stage 3a: Secondary | ICD-10-CM | POA: Diagnosis not present

## 2021-02-18 DIAGNOSIS — Z794 Long term (current) use of insulin: Secondary | ICD-10-CM | POA: Diagnosis not present

## 2021-02-18 DIAGNOSIS — I2510 Atherosclerotic heart disease of native coronary artery without angina pectoris: Secondary | ICD-10-CM | POA: Diagnosis not present

## 2021-02-18 DIAGNOSIS — J449 Chronic obstructive pulmonary disease, unspecified: Secondary | ICD-10-CM | POA: Diagnosis not present

## 2021-02-18 DIAGNOSIS — E1142 Type 2 diabetes mellitus with diabetic polyneuropathy: Secondary | ICD-10-CM | POA: Diagnosis not present

## 2021-02-18 DIAGNOSIS — E1151 Type 2 diabetes mellitus with diabetic peripheral angiopathy without gangrene: Secondary | ICD-10-CM | POA: Diagnosis not present

## 2021-02-18 DIAGNOSIS — Z7982 Long term (current) use of aspirin: Secondary | ICD-10-CM | POA: Diagnosis not present

## 2021-02-18 DIAGNOSIS — U071 COVID-19: Secondary | ICD-10-CM | POA: Diagnosis not present

## 2021-02-18 DIAGNOSIS — Z7951 Long term (current) use of inhaled steroids: Secondary | ICD-10-CM | POA: Diagnosis not present

## 2021-02-18 DIAGNOSIS — I480 Paroxysmal atrial fibrillation: Secondary | ICD-10-CM | POA: Diagnosis not present

## 2021-02-18 DIAGNOSIS — F32A Depression, unspecified: Secondary | ICD-10-CM | POA: Diagnosis not present

## 2021-02-18 DIAGNOSIS — S91012D Laceration without foreign body, left ankle, subsequent encounter: Secondary | ICD-10-CM | POA: Diagnosis not present

## 2021-02-18 DIAGNOSIS — E7800 Pure hypercholesterolemia, unspecified: Secondary | ICD-10-CM | POA: Diagnosis not present

## 2021-02-18 DIAGNOSIS — I129 Hypertensive chronic kidney disease with stage 1 through stage 4 chronic kidney disease, or unspecified chronic kidney disease: Secondary | ICD-10-CM | POA: Diagnosis not present

## 2021-02-18 DIAGNOSIS — R1310 Dysphagia, unspecified: Secondary | ICD-10-CM | POA: Diagnosis not present

## 2021-02-18 DIAGNOSIS — G4700 Insomnia, unspecified: Secondary | ICD-10-CM | POA: Diagnosis not present

## 2021-02-18 DIAGNOSIS — M5136 Other intervertebral disc degeneration, lumbar region: Secondary | ICD-10-CM | POA: Diagnosis not present

## 2021-02-18 DIAGNOSIS — D631 Anemia in chronic kidney disease: Secondary | ICD-10-CM | POA: Diagnosis not present

## 2021-02-18 DIAGNOSIS — K219 Gastro-esophageal reflux disease without esophagitis: Secondary | ICD-10-CM | POA: Diagnosis not present

## 2021-02-18 DIAGNOSIS — E1122 Type 2 diabetes mellitus with diabetic chronic kidney disease: Secondary | ICD-10-CM | POA: Diagnosis not present

## 2021-02-18 DIAGNOSIS — Z7902 Long term (current) use of antithrombotics/antiplatelets: Secondary | ICD-10-CM | POA: Diagnosis not present

## 2021-02-20 ENCOUNTER — Encounter (INDEPENDENT_AMBULATORY_CARE_PROVIDER_SITE_OTHER): Payer: Medicare Other

## 2021-02-20 ENCOUNTER — Ambulatory Visit (INDEPENDENT_AMBULATORY_CARE_PROVIDER_SITE_OTHER): Payer: Medicare Other | Admitting: Vascular Surgery

## 2021-02-20 ENCOUNTER — Ambulatory Visit: Payer: Self-pay | Admitting: Family Medicine

## 2021-02-20 DIAGNOSIS — T8189XA Other complications of procedures, not elsewhere classified, initial encounter: Secondary | ICD-10-CM | POA: Diagnosis not present

## 2021-02-20 DIAGNOSIS — G4700 Insomnia, unspecified: Secondary | ICD-10-CM | POA: Diagnosis not present

## 2021-02-20 DIAGNOSIS — U071 COVID-19: Secondary | ICD-10-CM | POA: Diagnosis not present

## 2021-02-20 DIAGNOSIS — R1310 Dysphagia, unspecified: Secondary | ICD-10-CM | POA: Diagnosis not present

## 2021-02-20 DIAGNOSIS — K219 Gastro-esophageal reflux disease without esophagitis: Secondary | ICD-10-CM | POA: Diagnosis not present

## 2021-02-20 DIAGNOSIS — Z7951 Long term (current) use of inhaled steroids: Secondary | ICD-10-CM | POA: Diagnosis not present

## 2021-02-20 DIAGNOSIS — E1142 Type 2 diabetes mellitus with diabetic polyneuropathy: Secondary | ICD-10-CM | POA: Diagnosis not present

## 2021-02-20 DIAGNOSIS — I129 Hypertensive chronic kidney disease with stage 1 through stage 4 chronic kidney disease, or unspecified chronic kidney disease: Secondary | ICD-10-CM | POA: Diagnosis not present

## 2021-02-20 DIAGNOSIS — S91012D Laceration without foreign body, left ankle, subsequent encounter: Secondary | ICD-10-CM | POA: Diagnosis not present

## 2021-02-20 DIAGNOSIS — Z794 Long term (current) use of insulin: Secondary | ICD-10-CM | POA: Diagnosis not present

## 2021-02-20 DIAGNOSIS — I2510 Atherosclerotic heart disease of native coronary artery without angina pectoris: Secondary | ICD-10-CM | POA: Diagnosis not present

## 2021-02-20 DIAGNOSIS — Z7902 Long term (current) use of antithrombotics/antiplatelets: Secondary | ICD-10-CM | POA: Diagnosis not present

## 2021-02-20 DIAGNOSIS — J449 Chronic obstructive pulmonary disease, unspecified: Secondary | ICD-10-CM | POA: Diagnosis not present

## 2021-02-20 DIAGNOSIS — M5136 Other intervertebral disc degeneration, lumbar region: Secondary | ICD-10-CM | POA: Diagnosis not present

## 2021-02-20 DIAGNOSIS — E1151 Type 2 diabetes mellitus with diabetic peripheral angiopathy without gangrene: Secondary | ICD-10-CM | POA: Diagnosis not present

## 2021-02-20 DIAGNOSIS — N1831 Chronic kidney disease, stage 3a: Secondary | ICD-10-CM | POA: Diagnosis not present

## 2021-02-20 DIAGNOSIS — F32A Depression, unspecified: Secondary | ICD-10-CM | POA: Diagnosis not present

## 2021-02-20 DIAGNOSIS — D631 Anemia in chronic kidney disease: Secondary | ICD-10-CM | POA: Diagnosis not present

## 2021-02-20 DIAGNOSIS — E1122 Type 2 diabetes mellitus with diabetic chronic kidney disease: Secondary | ICD-10-CM | POA: Diagnosis not present

## 2021-02-20 DIAGNOSIS — E7800 Pure hypercholesterolemia, unspecified: Secondary | ICD-10-CM | POA: Diagnosis not present

## 2021-02-20 DIAGNOSIS — I480 Paroxysmal atrial fibrillation: Secondary | ICD-10-CM | POA: Diagnosis not present

## 2021-02-20 DIAGNOSIS — Z7982 Long term (current) use of aspirin: Secondary | ICD-10-CM | POA: Diagnosis not present

## 2021-02-21 DIAGNOSIS — D631 Anemia in chronic kidney disease: Secondary | ICD-10-CM | POA: Diagnosis not present

## 2021-02-21 DIAGNOSIS — Z7902 Long term (current) use of antithrombotics/antiplatelets: Secondary | ICD-10-CM | POA: Diagnosis not present

## 2021-02-21 DIAGNOSIS — E1142 Type 2 diabetes mellitus with diabetic polyneuropathy: Secondary | ICD-10-CM | POA: Diagnosis not present

## 2021-02-21 DIAGNOSIS — I2510 Atherosclerotic heart disease of native coronary artery without angina pectoris: Secondary | ICD-10-CM | POA: Diagnosis not present

## 2021-02-21 DIAGNOSIS — E1122 Type 2 diabetes mellitus with diabetic chronic kidney disease: Secondary | ICD-10-CM | POA: Diagnosis not present

## 2021-02-21 DIAGNOSIS — U071 COVID-19: Secondary | ICD-10-CM | POA: Diagnosis not present

## 2021-02-21 DIAGNOSIS — Z7951 Long term (current) use of inhaled steroids: Secondary | ICD-10-CM | POA: Diagnosis not present

## 2021-02-21 DIAGNOSIS — I480 Paroxysmal atrial fibrillation: Secondary | ICD-10-CM | POA: Diagnosis not present

## 2021-02-21 DIAGNOSIS — R1310 Dysphagia, unspecified: Secondary | ICD-10-CM | POA: Diagnosis not present

## 2021-02-21 DIAGNOSIS — E7800 Pure hypercholesterolemia, unspecified: Secondary | ICD-10-CM | POA: Diagnosis not present

## 2021-02-21 DIAGNOSIS — I129 Hypertensive chronic kidney disease with stage 1 through stage 4 chronic kidney disease, or unspecified chronic kidney disease: Secondary | ICD-10-CM | POA: Diagnosis not present

## 2021-02-21 DIAGNOSIS — F32A Depression, unspecified: Secondary | ICD-10-CM | POA: Diagnosis not present

## 2021-02-21 DIAGNOSIS — N1831 Chronic kidney disease, stage 3a: Secondary | ICD-10-CM | POA: Diagnosis not present

## 2021-02-21 DIAGNOSIS — M5136 Other intervertebral disc degeneration, lumbar region: Secondary | ICD-10-CM | POA: Diagnosis not present

## 2021-02-21 DIAGNOSIS — J449 Chronic obstructive pulmonary disease, unspecified: Secondary | ICD-10-CM | POA: Diagnosis not present

## 2021-02-21 DIAGNOSIS — K219 Gastro-esophageal reflux disease without esophagitis: Secondary | ICD-10-CM | POA: Diagnosis not present

## 2021-02-21 DIAGNOSIS — Z7982 Long term (current) use of aspirin: Secondary | ICD-10-CM | POA: Diagnosis not present

## 2021-02-21 DIAGNOSIS — Z794 Long term (current) use of insulin: Secondary | ICD-10-CM | POA: Diagnosis not present

## 2021-02-21 DIAGNOSIS — G4700 Insomnia, unspecified: Secondary | ICD-10-CM | POA: Diagnosis not present

## 2021-02-21 DIAGNOSIS — E1151 Type 2 diabetes mellitus with diabetic peripheral angiopathy without gangrene: Secondary | ICD-10-CM | POA: Diagnosis not present

## 2021-02-21 DIAGNOSIS — S91012D Laceration without foreign body, left ankle, subsequent encounter: Secondary | ICD-10-CM | POA: Diagnosis not present

## 2021-02-23 DIAGNOSIS — S91012D Laceration without foreign body, left ankle, subsequent encounter: Secondary | ICD-10-CM | POA: Diagnosis not present

## 2021-02-23 DIAGNOSIS — F32A Depression, unspecified: Secondary | ICD-10-CM | POA: Diagnosis not present

## 2021-02-23 DIAGNOSIS — Z7951 Long term (current) use of inhaled steroids: Secondary | ICD-10-CM | POA: Diagnosis not present

## 2021-02-23 DIAGNOSIS — Z7982 Long term (current) use of aspirin: Secondary | ICD-10-CM | POA: Diagnosis not present

## 2021-02-23 DIAGNOSIS — I2510 Atherosclerotic heart disease of native coronary artery without angina pectoris: Secondary | ICD-10-CM | POA: Diagnosis not present

## 2021-02-23 DIAGNOSIS — N1831 Chronic kidney disease, stage 3a: Secondary | ICD-10-CM | POA: Diagnosis not present

## 2021-02-23 DIAGNOSIS — G4700 Insomnia, unspecified: Secondary | ICD-10-CM | POA: Diagnosis not present

## 2021-02-23 DIAGNOSIS — U071 COVID-19: Secondary | ICD-10-CM | POA: Diagnosis not present

## 2021-02-23 DIAGNOSIS — M5136 Other intervertebral disc degeneration, lumbar region: Secondary | ICD-10-CM | POA: Diagnosis not present

## 2021-02-23 DIAGNOSIS — D631 Anemia in chronic kidney disease: Secondary | ICD-10-CM | POA: Diagnosis not present

## 2021-02-23 DIAGNOSIS — J449 Chronic obstructive pulmonary disease, unspecified: Secondary | ICD-10-CM | POA: Diagnosis not present

## 2021-02-23 DIAGNOSIS — E7800 Pure hypercholesterolemia, unspecified: Secondary | ICD-10-CM | POA: Diagnosis not present

## 2021-02-23 DIAGNOSIS — I129 Hypertensive chronic kidney disease with stage 1 through stage 4 chronic kidney disease, or unspecified chronic kidney disease: Secondary | ICD-10-CM | POA: Diagnosis not present

## 2021-02-23 DIAGNOSIS — R1310 Dysphagia, unspecified: Secondary | ICD-10-CM | POA: Diagnosis not present

## 2021-02-23 DIAGNOSIS — Z794 Long term (current) use of insulin: Secondary | ICD-10-CM | POA: Diagnosis not present

## 2021-02-23 DIAGNOSIS — E1122 Type 2 diabetes mellitus with diabetic chronic kidney disease: Secondary | ICD-10-CM | POA: Diagnosis not present

## 2021-02-23 DIAGNOSIS — K219 Gastro-esophageal reflux disease without esophagitis: Secondary | ICD-10-CM | POA: Diagnosis not present

## 2021-02-23 DIAGNOSIS — E1142 Type 2 diabetes mellitus with diabetic polyneuropathy: Secondary | ICD-10-CM | POA: Diagnosis not present

## 2021-02-23 DIAGNOSIS — I480 Paroxysmal atrial fibrillation: Secondary | ICD-10-CM | POA: Diagnosis not present

## 2021-02-23 DIAGNOSIS — Z7902 Long term (current) use of antithrombotics/antiplatelets: Secondary | ICD-10-CM | POA: Diagnosis not present

## 2021-02-23 DIAGNOSIS — E1151 Type 2 diabetes mellitus with diabetic peripheral angiopathy without gangrene: Secondary | ICD-10-CM | POA: Diagnosis not present

## 2021-02-24 ENCOUNTER — Encounter: Payer: Self-pay | Admitting: Family Medicine

## 2021-02-24 ENCOUNTER — Other Ambulatory Visit: Payer: Self-pay

## 2021-02-24 ENCOUNTER — Ambulatory Visit (INDEPENDENT_AMBULATORY_CARE_PROVIDER_SITE_OTHER): Payer: Medicare Other | Admitting: Family Medicine

## 2021-02-24 VITALS — BP 158/73 | HR 87 | Temp 98.1°F | Resp 18

## 2021-02-24 DIAGNOSIS — S91012D Laceration without foreign body, left ankle, subsequent encounter: Secondary | ICD-10-CM | POA: Diagnosis not present

## 2021-02-24 DIAGNOSIS — S80821D Blister (nonthermal), right lower leg, subsequent encounter: Secondary | ICD-10-CM | POA: Diagnosis not present

## 2021-02-24 NOTE — Progress Notes (Signed)
I,[name],acting as a Education administrator for [employer], PA-C.,have documented all relevant documentation on the behalf of Vernie Murders, PA-C,as directed by  [employer], PA-C while in the presence of [employer], PA-C.  Established patient visit   Patient: [name]   DOB: 03/21/1929   87 y.o. Female  MRN: [id number] Visit Date: 02/24/2021  Today's healthcare provider: Vernie Murders, PA-C   Chief Complaint  Patient presents with  . Suture / Staple Removal   Subjective    HPI  Patient is here for suture removal. She has 8 sutures on lateral left ankle from fall on 02/08/2021. Patient also has multiple sores and contusions on her right leg.   Past Medical History:  Diagnosis Date  . Angina pectoris ([place])   . Atrial fibrillation ([place])   . Basal cell carcinoma    nose  . Cervicalgia   . Chronic back pain   . Chronic obstructive pulmonary disease (COPD) ([place])   . COPD (chronic obstructive pulmonary disease) ([place])   . DDD (degenerative disc disease), lumbar   . Diabetes mellitus without complication ([place])   . Dysphagia   . Hyperlipemia   . Hypertension   . Squamous cell carcinoma of skin    left lateral pretibial  . Vulvovaginitis    Past Surgical History:  Procedure Laterality Date  . gall bladder    . melanoma     removal neck and back  . PERIPHERAL VASCULAR CATHETERIZATION Left 01/05/2016   Procedure: Lower Extremity Angiography;  Surgeon: Algernon Huxley, MD;  Location: [facility];  Service: Cardiovascular;  Laterality: Left;  . PERIPHERAL VASCULAR CATHETERIZATION  01/05/2016   Procedure: Lower Extremity Intervention;  Surgeon: Algernon Huxley, MD;  Location: [facility];  Service: Cardiovascular;;  . ureterolithiasis     calculus removed  . VAGINAL HYSTERECTOMY    . VISCERAL ANGIOGRAPHY N/A 05/10/2019   Procedure: VISCERAL ANGIOGRAPHY;  Surgeon: Algernon Huxley, MD;  Location: [facility];  Service: Cardiovascular;   Laterality: N/A;  . VULVA / PERINEUM BIOPSY  05/29/2015   Social History   Tobacco Use  . Smoking status: Never Smoker  . Smokeless tobacco: Never Used  Vaping Use  . Vaping Use: Never used  Substance Use Topics  . Alcohol use: No  . Drug use: No   Family History  Problem Relation Age of Onset  . Ovarian cancer Other   . Diabetes Sister   . Colon cancer Brother   . Diabetes Brother   . Atrial fibrillation Daughter   . Breast cancer Neg Hx    Allergies  Allergen Reactions  . Bacitracin-Neomycin-Polymyxin Rash  . Neomycin-Bacitracin Zn-Polymyx Swelling and Rash  . Latex Rash  . Lidocaine Rash  . Aricept [Donepezil Hcl] Diarrhea  . Benzalkonium Chloride Itching and Swelling  . Ibuprofen     Other reaction(s): Dizziness Heart fluttering. Tachycardia. Tachycardia.  . Valdecoxib Nausea And Vomiting  . Albuterol Rash  . Tape Rash    blisters  . Triamcinolone Rash       Medications: Outpatient Medications Prior to Visit  Medication Sig  . ADVAIR HFA 230-21 MCG/ACT inhaler TAKE 2 PUFFS INTO LUNGS TWICE A DAY (Patient taking differently: Inhale 2 puffs into the lungs 2 (two) times daily.)  . albuterol (VENTOLIN HFA) 108 (90 Base) MCG/ACT inhaler INHALE 2 PUFFS INTO THE LUNGS EVERY 4 HOURS AS NEEDED FOR WHEEZING ORSHORTNESS OF BREATH (Patient taking differently: Inhale 2 puffs into the  lungs every 4 (four) hours as needed for wheezing or shortness of breath. INHALE 2 PUFFS INTO THE LUNGS EVERY 4 HOURS AS NEEDED FOR WHEEZING ORSHORTNESS OF BREATH)  . aspirin 325 MG tablet Take 325 mg by mouth daily. Reported on 01/05/2016  . atorvastatin (LIPITOR) 10 MG tablet Take 1 tablet (10 mg total) by mouth daily.  . cetirizine (ZYRTEC) 10 MG tablet TAKE ONE TABLET BY MOUTH EVERY DAY (Patient taking differently: Take 10 mg by mouth daily.)  . cholecalciferol (VITAMIN D3) 25 MCG (1000 UT) tablet Take 1,000 Units by mouth daily.  . clopidogrel (PLAVIX) 75 MG tablet TAKE 1 TABLET BY MOUTH  DAILY (Patient taking differently: Take 75 mg by mouth daily.)  . diltiazem (TIADYLT ER) 360 MG 24 hr capsule Hold until followup with your outpatient doctor due to your blood pressure on the low side.  . ezetimibe (ZETIA) 10 MG tablet TAKE 1 TABLET BY MOUTH DAILY (Patient taking differently: Take 10 mg by mouth daily.)  . FLUoxetine (PROZAC) 20 MG capsule TAKE 1 CAPSULE EVERY DAY (Patient taking differently: Take 20 mg by mouth daily.)  . glucose blood test strip ACCU-CHEK AVIVA PLUS TEST STRP Use to check sugars 3 times daily.  . insulin glargine (LANTUS) 100 UNIT/ML injection Inject 0.05 mLs (5 Units total) into the skin at bedtime.  . insulin lispro (HUMALOG) 100 UNIT/ML KiwkPen Inject 10 Units into the skin 3 (three) times daily. 10 units before breakfast, 8 before lunch and 18 before supper  . iron polysaccharides (NIFEREX) 150 MG capsule Take 1 capsule (150 mg total) by mouth daily.  . isosorbide mononitrate (IMDUR) 30 MG 24 hr tablet Take 30 mg by mouth daily.  . lansoprazole (PREVACID) 30 MG capsule TAKE 1 CAPSULE BY MOUTH ONCE DAILY AT NOON (Patient taking differently: Take 30 mg by mouth daily at 12 noon. TAKE 1 CAPSULE BY MOUTH ONCE DAILY AT NOON)  . losartan (COZAAR) 100 MG tablet Take 0.5 tablets (50 mg total) by mouth daily. This is a decrease from 100 mg daily.  . Magnesium 400 MG CAPS Take by mouth daily.  . magnesium oxide (MAG-OX) 400 MG tablet Take 400 mg by mouth daily.  . meloxicam (MOBIC) 7.5 MG tablet Take 1 tablet (7.5 mg total) by mouth daily as needed for pain.  . memantine (NAMENDA) 5 MG tablet TAKE ONE TABLET TWICE DAILY (Patient taking differently: Take 5 mg by mouth 2 (two) times daily.)  . montelukast (SINGULAIR) 10 MG tablet TAKE ONE TABLET AT BEDTIME (Patient taking differently: Take 10 mg by mouth at bedtime.)  . ondansetron (ZOFRAN-ODT) 4 MG disintegrating tablet Take 4 mg by mouth every 8 (eight) hours as needed for nausea. For up to 12 doses.  Marland Kitchen QUEtiapine  (SEROQUEL) 25 MG tablet Take 1 tablet by mouth at bedtime.  . traZODone (DESYREL) 50 MG tablet TAKE 1 AND 1/2 TABLET AT BEDTIME AS NEEDED FOR SLEEP (Patient taking differently: Take 75 mg by mouth at bedtime as needed for sleep.)  . TUSSIN DM 100-10 MG/5ML liquid TAKE 1 TEASPOONFUL BY MOUTH EVERY 4 HOURS AS NEEDED FOR COUGH  . vitamin B-12 1000 MCG tablet Take 1 tablet (1,000 mcg total) by mouth daily.  . vitamin E 1000 UNIT capsule Take 1,000 Units by mouth daily.  . pantoprazole (PROTONIX) 40 MG tablet Take 40 mg by mouth daily. (Patient not taking: No sig reported)   No facility-administered medications prior to visit.    Review of Systems  Constitutional: Negative for appetite  change, chills, fatigue and fever.  Respiratory: Negative for chest tightness and shortness of breath.   Cardiovascular: Negative for chest pain and palpitations.  Gastrointestinal: Negative for abdominal pain, nausea and vomiting.  Neurological: Negative for dizziness and weakness.       Objective    BP (!) 158/73 (BP Location: Right Arm, Patient Position: Sitting, Cuff Size: Large)   Pulse 87   Temp 98.1 F (36.7 C) (Oral)   Resp 18   SpO2 97%     Physical Exam Constitutional:      General: She is not in acute distress.    Appearance: She is well-developed.  HENT:     Head: Normocephalic and atraumatic.     Right Ear: Hearing normal.     Left Ear: Hearing normal.     Nose: Nose normal.  Eyes:     General: Lids are normal. No scleral icterus.       Right eye: No discharge.        Left eye: No discharge.     Conjunctiva/sclera: Conjunctivae normal.  Pulmonary:     Effort: Pulmonary effort is normal. No respiratory distress.  Musculoskeletal:        General: Normal range of motion.  Skin:    Findings: No lesion or rash.     Comments: Large L-shaped laceration over the left lateral malleolus. Mild puffiness of foot. Large 6 x 2.5 cm blood blister right lateral lower leg with large bruise over  shin.   Neurological:     Mental Status: She is alert and oriented to person, place, and time.  Psychiatric:        Speech: Speech normal.        Behavior: Behavior normal.        Thought Content: Thought content normal.      No results found for any visits on 02/24/21.  Assessment & Plan     1. Laceration without foreign body, left ankle, subsequent encounter Dropped a glass vase of flowers that broke and lacerated her left ankle over the lateral malleolus 2 weeks ago. Had it sutured on 02-08-21.Marland Kitchen No sign of infection. Eight sutures removed and applied 1/2" steri-strips to maintain wound edge approximation. Home health nurse monitoring and having PT for poor balance and walker training. Recheck in 7-10 day is any problems. Redressed with Telfa and Coban.  2. Blister of right lower leg, subsequent encounter 6 x 2.5 cm blood blister on the right lateral lower leg. Blood had jelled and may cover with gauze wrap and Coban for protection.    No follow-ups on file.      I, Crosby Oriordan, PA-C, have reviewed all documentation for this visit. The documentation on 02/24/21 for the exam, diagnosis, procedures, and orders are all accurate and complete.    Vernie Murders, PA-C  [employer] [phone] (phone) [phone] (fax)  [place]

## 2021-02-25 DIAGNOSIS — Z7902 Long term (current) use of antithrombotics/antiplatelets: Secondary | ICD-10-CM | POA: Diagnosis not present

## 2021-02-25 DIAGNOSIS — E1151 Type 2 diabetes mellitus with diabetic peripheral angiopathy without gangrene: Secondary | ICD-10-CM | POA: Diagnosis not present

## 2021-02-25 DIAGNOSIS — F32A Depression, unspecified: Secondary | ICD-10-CM | POA: Diagnosis not present

## 2021-02-25 DIAGNOSIS — E7800 Pure hypercholesterolemia, unspecified: Secondary | ICD-10-CM | POA: Diagnosis not present

## 2021-02-25 DIAGNOSIS — Z794 Long term (current) use of insulin: Secondary | ICD-10-CM | POA: Diagnosis not present

## 2021-02-25 DIAGNOSIS — E1142 Type 2 diabetes mellitus with diabetic polyneuropathy: Secondary | ICD-10-CM | POA: Diagnosis not present

## 2021-02-25 DIAGNOSIS — M5136 Other intervertebral disc degeneration, lumbar region: Secondary | ICD-10-CM | POA: Diagnosis not present

## 2021-02-25 DIAGNOSIS — R1310 Dysphagia, unspecified: Secondary | ICD-10-CM | POA: Diagnosis not present

## 2021-02-25 DIAGNOSIS — K219 Gastro-esophageal reflux disease without esophagitis: Secondary | ICD-10-CM | POA: Diagnosis not present

## 2021-02-25 DIAGNOSIS — Z7951 Long term (current) use of inhaled steroids: Secondary | ICD-10-CM | POA: Diagnosis not present

## 2021-02-25 DIAGNOSIS — I2510 Atherosclerotic heart disease of native coronary artery without angina pectoris: Secondary | ICD-10-CM | POA: Diagnosis not present

## 2021-02-25 DIAGNOSIS — D631 Anemia in chronic kidney disease: Secondary | ICD-10-CM | POA: Diagnosis not present

## 2021-02-25 DIAGNOSIS — J449 Chronic obstructive pulmonary disease, unspecified: Secondary | ICD-10-CM | POA: Diagnosis not present

## 2021-02-25 DIAGNOSIS — E1122 Type 2 diabetes mellitus with diabetic chronic kidney disease: Secondary | ICD-10-CM | POA: Diagnosis not present

## 2021-02-25 DIAGNOSIS — S91012D Laceration without foreign body, left ankle, subsequent encounter: Secondary | ICD-10-CM | POA: Diagnosis not present

## 2021-02-25 DIAGNOSIS — U071 COVID-19: Secondary | ICD-10-CM | POA: Diagnosis not present

## 2021-02-25 DIAGNOSIS — I480 Paroxysmal atrial fibrillation: Secondary | ICD-10-CM | POA: Diagnosis not present

## 2021-02-25 DIAGNOSIS — Z7982 Long term (current) use of aspirin: Secondary | ICD-10-CM | POA: Diagnosis not present

## 2021-02-25 DIAGNOSIS — I129 Hypertensive chronic kidney disease with stage 1 through stage 4 chronic kidney disease, or unspecified chronic kidney disease: Secondary | ICD-10-CM | POA: Diagnosis not present

## 2021-02-25 DIAGNOSIS — N1831 Chronic kidney disease, stage 3a: Secondary | ICD-10-CM | POA: Diagnosis not present

## 2021-02-25 DIAGNOSIS — G4700 Insomnia, unspecified: Secondary | ICD-10-CM | POA: Diagnosis not present

## 2021-02-26 DIAGNOSIS — E1129 Type 2 diabetes mellitus with other diabetic kidney complication: Secondary | ICD-10-CM | POA: Diagnosis not present

## 2021-02-26 DIAGNOSIS — I129 Hypertensive chronic kidney disease with stage 1 through stage 4 chronic kidney disease, or unspecified chronic kidney disease: Secondary | ICD-10-CM | POA: Diagnosis not present

## 2021-02-26 DIAGNOSIS — Z7982 Long term (current) use of aspirin: Secondary | ICD-10-CM | POA: Diagnosis not present

## 2021-02-26 DIAGNOSIS — Z7902 Long term (current) use of antithrombotics/antiplatelets: Secondary | ICD-10-CM | POA: Diagnosis not present

## 2021-02-26 DIAGNOSIS — G4700 Insomnia, unspecified: Secondary | ICD-10-CM | POA: Diagnosis not present

## 2021-02-26 DIAGNOSIS — R809 Proteinuria, unspecified: Secondary | ICD-10-CM | POA: Diagnosis not present

## 2021-02-26 DIAGNOSIS — F32A Depression, unspecified: Secondary | ICD-10-CM | POA: Diagnosis not present

## 2021-02-26 DIAGNOSIS — M5136 Other intervertebral disc degeneration, lumbar region: Secondary | ICD-10-CM | POA: Diagnosis not present

## 2021-02-26 DIAGNOSIS — J449 Chronic obstructive pulmonary disease, unspecified: Secondary | ICD-10-CM | POA: Diagnosis not present

## 2021-02-26 DIAGNOSIS — S91012D Laceration without foreign body, left ankle, subsequent encounter: Secondary | ICD-10-CM | POA: Diagnosis not present

## 2021-02-26 DIAGNOSIS — Z7951 Long term (current) use of inhaled steroids: Secondary | ICD-10-CM | POA: Diagnosis not present

## 2021-02-26 DIAGNOSIS — E1122 Type 2 diabetes mellitus with diabetic chronic kidney disease: Secondary | ICD-10-CM | POA: Diagnosis not present

## 2021-02-26 DIAGNOSIS — K219 Gastro-esophageal reflux disease without esophagitis: Secondary | ICD-10-CM | POA: Diagnosis not present

## 2021-02-26 DIAGNOSIS — N1831 Chronic kidney disease, stage 3a: Secondary | ICD-10-CM | POA: Diagnosis not present

## 2021-02-26 DIAGNOSIS — D631 Anemia in chronic kidney disease: Secondary | ICD-10-CM | POA: Diagnosis not present

## 2021-02-26 DIAGNOSIS — U071 COVID-19: Secondary | ICD-10-CM | POA: Diagnosis not present

## 2021-02-26 DIAGNOSIS — E7800 Pure hypercholesterolemia, unspecified: Secondary | ICD-10-CM | POA: Diagnosis not present

## 2021-02-26 DIAGNOSIS — E1142 Type 2 diabetes mellitus with diabetic polyneuropathy: Secondary | ICD-10-CM | POA: Diagnosis not present

## 2021-02-26 DIAGNOSIS — R1310 Dysphagia, unspecified: Secondary | ICD-10-CM | POA: Diagnosis not present

## 2021-02-26 DIAGNOSIS — E1151 Type 2 diabetes mellitus with diabetic peripheral angiopathy without gangrene: Secondary | ICD-10-CM | POA: Diagnosis not present

## 2021-02-26 DIAGNOSIS — I2510 Atherosclerotic heart disease of native coronary artery without angina pectoris: Secondary | ICD-10-CM | POA: Diagnosis not present

## 2021-02-26 DIAGNOSIS — I480 Paroxysmal atrial fibrillation: Secondary | ICD-10-CM | POA: Diagnosis not present

## 2021-02-26 DIAGNOSIS — Z794 Long term (current) use of insulin: Secondary | ICD-10-CM | POA: Diagnosis not present

## 2021-02-27 ENCOUNTER — Ambulatory Visit (INDEPENDENT_AMBULATORY_CARE_PROVIDER_SITE_OTHER): Payer: Medicare Other

## 2021-02-27 DIAGNOSIS — E1159 Type 2 diabetes mellitus with other circulatory complications: Secondary | ICD-10-CM | POA: Diagnosis not present

## 2021-02-27 DIAGNOSIS — Z9181 History of falling: Secondary | ICD-10-CM

## 2021-02-27 NOTE — Chronic Care Management (AMB) (Signed)
Chronic Care Management   Follow Up Note   02/27/2021 Name: [name] MRN: [id number] DOB: 08-20-1929  Primary Care Provider: Jerrol Banana., MD Reason for referral : Chronic Care Management   [name] is a 87 y.o. year old female who is a primary care patient of Jerrol Banana., MD. She is currently enrolled in the Chronic Care Management program. A routine telephonic outreach was conducted today with her daughter/caregiver [name].  Review of [name]'s status, including review of consultants reports, relevant labs and test results was conducted today. Collaboration with appropriate care team members was performed as part of the comprehensive evaluation and provision of chronic care management services.    SDOH (Social Determinants of Health) assessments performed: No     Outpatient Encounter Medications as of 02/27/2021  Medication Sig  . ADVAIR HFA 230-21 MCG/ACT inhaler TAKE 2 PUFFS INTO LUNGS TWICE A DAY (Patient taking differently: Inhale 2 puffs into the lungs 2 (two) times daily.)  . albuterol (VENTOLIN HFA) 108 (90 Base) MCG/ACT inhaler INHALE 2 PUFFS INTO THE LUNGS EVERY 4 HOURS AS NEEDED FOR WHEEZING ORSHORTNESS OF BREATH (Patient taking differently: Inhale 2 puffs into the lungs every 4 (four) hours as needed for wheezing or shortness of breath. INHALE 2 PUFFS INTO THE LUNGS EVERY 4 HOURS AS NEEDED FOR WHEEZING ORSHORTNESS OF BREATH)  . aspirin 325 MG tablet Take 325 mg by mouth daily. Reported on 01/05/2016  . atorvastatin (LIPITOR) 10 MG tablet Take 1 tablet (10 mg total) by mouth daily.  . cetirizine (ZYRTEC) 10 MG tablet TAKE ONE TABLET BY MOUTH EVERY DAY (Patient taking differently: Take 10 mg by mouth daily.)  . cholecalciferol (VITAMIN D3) 25 MCG (1000 UT) tablet Take 1,000 Units by mouth daily.  . clopidogrel (PLAVIX) 75 MG tablet TAKE 1 TABLET BY MOUTH DAILY (Patient taking differently: Take 75 mg by mouth daily.)  . diltiazem  (TIADYLT ER) 360 MG 24 hr capsule Hold until followup with your outpatient doctor due to your blood pressure on the low side.  . ezetimibe (ZETIA) 10 MG tablet TAKE 1 TABLET BY MOUTH DAILY (Patient taking differently: Take 10 mg by mouth daily.)  . FLUoxetine (PROZAC) 20 MG capsule TAKE 1 CAPSULE EVERY DAY (Patient taking differently: Take 20 mg by mouth daily.)  . glucose blood test strip ACCU-CHEK AVIVA PLUS TEST STRP Use to check sugars 3 times daily.  . insulin glargine (LANTUS) 100 UNIT/ML injection Inject 0.05 mLs (5 Units total) into the skin at bedtime.  . insulin lispro (HUMALOG) 100 UNIT/ML KiwkPen Inject 10 Units into the skin 3 (three) times daily. 10 units before breakfast, 8 before lunch and 18 before supper  . iron polysaccharides (NIFEREX) 150 MG capsule Take 1 capsule (150 mg total) by mouth daily.  . isosorbide mononitrate (IMDUR) 30 MG 24 hr tablet Take 30 mg by mouth daily.  . lansoprazole (PREVACID) 30 MG capsule TAKE 1 CAPSULE BY MOUTH ONCE DAILY AT NOON (Patient taking differently: Take 30 mg by mouth daily at 12 noon. TAKE 1 CAPSULE BY MOUTH ONCE DAILY AT NOON)  . losartan (COZAAR) 100 MG tablet Take 0.5 tablets (50 mg total) by mouth daily. This is a decrease from 100 mg daily.  . Magnesium 400 MG CAPS Take by mouth daily.  . magnesium oxide (MAG-OX) 400 MG tablet Take 400 mg by mouth daily.  . meloxicam (MOBIC) 7.5 MG tablet Take 1 tablet (7.5 mg total) by mouth daily as needed for  pain.  . memantine (NAMENDA) 5 MG tablet TAKE ONE TABLET TWICE DAILY (Patient taking differently: Take 5 mg by mouth 2 (two) times daily.)  . montelukast (SINGULAIR) 10 MG tablet TAKE ONE TABLET AT BEDTIME (Patient taking differently: Take 10 mg by mouth at bedtime.)  . ondansetron (ZOFRAN-ODT) 4 MG disintegrating tablet Take 4 mg by mouth every 8 (eight) hours as needed for nausea. For up to 12 doses.  . pantoprazole (PROTONIX) 40 MG tablet Take 40 mg by mouth daily. (Patient not taking: No sig  reported)  . QUEtiapine (SEROQUEL) 25 MG tablet Take 1 tablet by mouth at bedtime.  . traZODone (DESYREL) 50 MG tablet TAKE 1 AND 1/2 TABLET AT BEDTIME AS NEEDED FOR SLEEP (Patient taking differently: Take 75 mg by mouth at bedtime as needed for sleep.)  . TUSSIN DM 100-10 MG/5ML liquid TAKE 1 TEASPOONFUL BY MOUTH EVERY 4 HOURS AS NEEDED FOR COUGH  . vitamin B-12 1000 MCG tablet Take 1 tablet (1,000 mcg total) by mouth daily.  . vitamin E 1000 UNIT capsule Take 1,000 Units by mouth daily.   No facility-administered encounter medications on file as of 02/27/2021.       Objective:  Patient Care Plan: Diabetes Type 2 (Adult)    Problem Identified: Disease Progression (Diabetes, Type 2)     Long-Range Goal: Disease Progression Prevented or Minimized   Start Date: 11/14/2020  Expected End Date: 03/14/2021  Priority: High  Note:   Lab Results  Component Value Date   HGBA1C 7.6 (H) 02/08/2021    Current Barriers:  . Chronic disease management support and educational needs related to Diabetes self-management   Case Manager Clinical Goal(s):  Marland Kitchen Over the next 120 days, patient will demonstrate improved adherence to prescribed treatment plan for Diabetes self-management as evidenced by taking medications as prescribed, adhering to recommended ADA/carb modified diet and utilizing Dexcom/taking appropriate interventions to correct blood glucose levels when the device alarms.   Interventions:  . Collaboration with Jerrol Banana., MD regarding development and update of comprehensive plan of care as evidenced by provider attestation and co-signature . Inter-disciplinary care team collaboration (see longitudinal plan of care) . Per daughter [name], Mrs. Walgren is taking medications as prescribed. The family continues to prepare her weekly medications d/t patient's decreased vision. Reports that she is tolerating the prescribed regimen well. Does not recall episodes of hyperglycemia or  hypoglycemia over the past week. She is still able to manager her Dexcom and standard glucometer independently and respond to alarms appropriately. No changes in appetite. Reports good nutritional intake.    Patient Goals/Self-Care Activities -Self-administer medications as prescribed -Attend all scheduled provider appointments -Continue utilizing Dexcom and take appropriate interventions when the device alarms -Adhere to prescribed ADA/carb modified -Notify provider or care management team with questions and new concerns as needed   Follow Up Plan:  Will follow up next month        Patient Care Plan: Fall Risk (Adult)    Problem Identified: Fall Risk     Long-Range Goal: Maintain Function /Absence of Fall and Fall-Related Injury   Start Date: 11/14/2020  Expected End Date: 03/16/2021  Priority: High  Note:   Current Barriers:  . Fall Risk r/t Impaired Vision and Impaired Gait  Clinical Goal(s):  Marland Kitchen Over the next 120 days, patient will not experience falls or require emergent care d/t fall related injuries.  Interventions:  . Collaboration with Jerrol Banana., MD regarding development and update of comprehensive plan  of care as evidenced by provider attestation and co-signature . Inter-disciplinary care team collaboration (see longitudinal plan of care) . Discussed treatment plan since hospitalization in May. Tammy reports no falls since discharge. Reports that she is being monitored very closely at home.  She is currently receiving nursing, occupational therapy and physical therapy services with [facility]. Tammy notes that the therapist described her gait as "guarded" and notes that her activity level has significantly declined d/t fear of falling. She is still able to remain home alone during the day with family members checking on her frequently.  . Discussed plan regarding possible long term needs. Tammy reports they have agreed to engaging with the  Palliative Care team and discussed need for assisted living if Mrs. Monsivais's functional ability declines.  They still feel that she would benefit from short term rehab d/t recent activity decline. Pending feedback from the physical therapist regarding anticipated length of in-home treatment.    Self-Care Deficits/Patient Goals:  -Utilize assistive device appropriately with all ambulation -Change positions slowly and wear non skid footwear when ambulating -Contact provider or the care management team with questions or new concerns   Follow Up Plan Will follow up next month        PLAN A member of the care management team will follow up next month.    [facility] Care Management [facility] [phone]

## 2021-03-02 DIAGNOSIS — E1165 Type 2 diabetes mellitus with hyperglycemia: Secondary | ICD-10-CM | POA: Diagnosis not present

## 2021-03-02 DIAGNOSIS — E1142 Type 2 diabetes mellitus with diabetic polyneuropathy: Secondary | ICD-10-CM | POA: Diagnosis not present

## 2021-03-02 DIAGNOSIS — E1151 Type 2 diabetes mellitus with diabetic peripheral angiopathy without gangrene: Secondary | ICD-10-CM | POA: Diagnosis not present

## 2021-03-02 DIAGNOSIS — I2510 Atherosclerotic heart disease of native coronary artery without angina pectoris: Secondary | ICD-10-CM | POA: Diagnosis not present

## 2021-03-02 DIAGNOSIS — N1831 Chronic kidney disease, stage 3a: Secondary | ICD-10-CM | POA: Diagnosis not present

## 2021-03-02 DIAGNOSIS — U071 COVID-19: Secondary | ICD-10-CM | POA: Diagnosis not present

## 2021-03-02 DIAGNOSIS — I129 Hypertensive chronic kidney disease with stage 1 through stage 4 chronic kidney disease, or unspecified chronic kidney disease: Secondary | ICD-10-CM | POA: Diagnosis not present

## 2021-03-02 DIAGNOSIS — F0280 Dementia in other diseases classified elsewhere without behavioral disturbance: Secondary | ICD-10-CM

## 2021-03-02 DIAGNOSIS — Z794 Long term (current) use of insulin: Secondary | ICD-10-CM | POA: Diagnosis not present

## 2021-03-02 DIAGNOSIS — M5136 Other intervertebral disc degeneration, lumbar region: Secondary | ICD-10-CM | POA: Diagnosis not present

## 2021-03-02 DIAGNOSIS — S91012D Laceration without foreign body, left ankle, subsequent encounter: Secondary | ICD-10-CM | POA: Diagnosis not present

## 2021-03-02 DIAGNOSIS — E1122 Type 2 diabetes mellitus with diabetic chronic kidney disease: Secondary | ICD-10-CM | POA: Diagnosis not present

## 2021-03-02 DIAGNOSIS — I480 Paroxysmal atrial fibrillation: Secondary | ICD-10-CM | POA: Diagnosis not present

## 2021-03-02 DIAGNOSIS — Z7982 Long term (current) use of aspirin: Secondary | ICD-10-CM | POA: Diagnosis not present

## 2021-03-02 DIAGNOSIS — Z7951 Long term (current) use of inhaled steroids: Secondary | ICD-10-CM | POA: Diagnosis not present

## 2021-03-02 DIAGNOSIS — E7800 Pure hypercholesterolemia, unspecified: Secondary | ICD-10-CM | POA: Diagnosis not present

## 2021-03-02 DIAGNOSIS — Z7902 Long term (current) use of antithrombotics/antiplatelets: Secondary | ICD-10-CM | POA: Diagnosis not present

## 2021-03-02 DIAGNOSIS — J449 Chronic obstructive pulmonary disease, unspecified: Secondary | ICD-10-CM | POA: Diagnosis not present

## 2021-03-02 DIAGNOSIS — R1310 Dysphagia, unspecified: Secondary | ICD-10-CM | POA: Diagnosis not present

## 2021-03-02 DIAGNOSIS — G3183 Dementia with Lewy bodies: Secondary | ICD-10-CM

## 2021-03-02 DIAGNOSIS — G4700 Insomnia, unspecified: Secondary | ICD-10-CM | POA: Diagnosis not present

## 2021-03-02 DIAGNOSIS — D631 Anemia in chronic kidney disease: Secondary | ICD-10-CM | POA: Diagnosis not present

## 2021-03-02 DIAGNOSIS — F32A Depression, unspecified: Secondary | ICD-10-CM | POA: Diagnosis not present

## 2021-03-02 DIAGNOSIS — K219 Gastro-esophageal reflux disease without esophagitis: Secondary | ICD-10-CM | POA: Diagnosis not present

## 2021-03-04 ENCOUNTER — Ambulatory Visit: Payer: Self-pay | Admitting: *Deleted

## 2021-03-04 DIAGNOSIS — D631 Anemia in chronic kidney disease: Secondary | ICD-10-CM | POA: Diagnosis not present

## 2021-03-04 DIAGNOSIS — U071 COVID-19: Secondary | ICD-10-CM | POA: Diagnosis not present

## 2021-03-04 DIAGNOSIS — Z794 Long term (current) use of insulin: Secondary | ICD-10-CM | POA: Diagnosis not present

## 2021-03-04 DIAGNOSIS — R1310 Dysphagia, unspecified: Secondary | ICD-10-CM | POA: Diagnosis not present

## 2021-03-04 DIAGNOSIS — G4700 Insomnia, unspecified: Secondary | ICD-10-CM | POA: Diagnosis not present

## 2021-03-04 DIAGNOSIS — E1142 Type 2 diabetes mellitus with diabetic polyneuropathy: Secondary | ICD-10-CM | POA: Diagnosis not present

## 2021-03-04 DIAGNOSIS — I480 Paroxysmal atrial fibrillation: Secondary | ICD-10-CM | POA: Diagnosis not present

## 2021-03-04 DIAGNOSIS — I129 Hypertensive chronic kidney disease with stage 1 through stage 4 chronic kidney disease, or unspecified chronic kidney disease: Secondary | ICD-10-CM | POA: Diagnosis not present

## 2021-03-04 DIAGNOSIS — E1122 Type 2 diabetes mellitus with diabetic chronic kidney disease: Secondary | ICD-10-CM | POA: Diagnosis not present

## 2021-03-04 DIAGNOSIS — S91012D Laceration without foreign body, left ankle, subsequent encounter: Secondary | ICD-10-CM | POA: Diagnosis not present

## 2021-03-04 DIAGNOSIS — K219 Gastro-esophageal reflux disease without esophagitis: Secondary | ICD-10-CM | POA: Diagnosis not present

## 2021-03-04 DIAGNOSIS — J449 Chronic obstructive pulmonary disease, unspecified: Secondary | ICD-10-CM | POA: Diagnosis not present

## 2021-03-04 DIAGNOSIS — I2510 Atherosclerotic heart disease of native coronary artery without angina pectoris: Secondary | ICD-10-CM | POA: Diagnosis not present

## 2021-03-04 DIAGNOSIS — Z7982 Long term (current) use of aspirin: Secondary | ICD-10-CM | POA: Diagnosis not present

## 2021-03-04 DIAGNOSIS — N1831 Chronic kidney disease, stage 3a: Secondary | ICD-10-CM | POA: Diagnosis not present

## 2021-03-04 DIAGNOSIS — F32A Depression, unspecified: Secondary | ICD-10-CM | POA: Diagnosis not present

## 2021-03-04 DIAGNOSIS — Z7902 Long term (current) use of antithrombotics/antiplatelets: Secondary | ICD-10-CM | POA: Diagnosis not present

## 2021-03-04 DIAGNOSIS — E1151 Type 2 diabetes mellitus with diabetic peripheral angiopathy without gangrene: Secondary | ICD-10-CM | POA: Diagnosis not present

## 2021-03-04 DIAGNOSIS — Z7951 Long term (current) use of inhaled steroids: Secondary | ICD-10-CM | POA: Diagnosis not present

## 2021-03-04 DIAGNOSIS — M5136 Other intervertebral disc degeneration, lumbar region: Secondary | ICD-10-CM | POA: Diagnosis not present

## 2021-03-04 DIAGNOSIS — E7800 Pure hypercholesterolemia, unspecified: Secondary | ICD-10-CM | POA: Diagnosis not present

## 2021-03-04 MED ORDER — AMOXICILLIN-POT CLAVULANATE 875-125 MG PO TABS: 1.0000 | ORAL_TABLET | Freq: Two times a day (BID) | ORAL | 1 refills | Status: DC

## 2021-03-04 NOTE — Addendum Note (Signed)
Addended by: Wilburt Finlay on: 03/04/2021 04:32 PM   Modules accepted: Orders

## 2021-03-04 NOTE — Telephone Encounter (Signed)
Medication sent into the pharmacy. 

## 2021-03-04 NOTE — Patient Instructions (Signed)
Thank you for allowing the Chronic Care Management team to participate in your care. It was a pleasure speaking with you. Please feel free to contact me with questions.  Goals Addressed: Patient Care Plan: Diabetes Type 2 (Adult)    Problem Identified: Disease Progression (Diabetes, Type 2)     Long-Range Goal: Disease Progression Prevented or Minimized   Start Date: 11/14/2020  Expected End Date: 03/14/2021  Priority: High  Note:   Lab Results  Component Value Date   HGBA1C 7.6 (H) 02/08/2021    Current Barriers:  . Chronic disease management support and educational needs related to Diabetes self-management   Case Manager Clinical Goal(s):  Marland Kitchen Over the next 120 days, patient will demonstrate improved adherence to prescribed treatment plan for Diabetes self-management as evidenced by taking medications as prescribed, adhering to recommended ADA/carb modified diet and utilizing Dexcom/taking appropriate interventions to correct blood glucose levels when the device alarms.   Interventions:  . Collaboration with [name] Hall., MD regarding development and update of comprehensive plan of care as evidenced by provider attestation and co-signature . Inter-disciplinary care team collaboration (see longitudinal plan of care) . Per daughter [name] Hall, [name] Hall is taking medications as prescribed. The family continues to prepare her weekly medications d/t patient's decreased vision. Reports that she is tolerating the prescribed regimen well. Does not recall episodes of hyperglycemia or hypoglycemia over the past week. She is still able to manager her Dexcom and standard glucometer independently and respond to alarms appropriately. No changes in appetite. Reports good nutritional intake.    Patient Goals/Self-Care Activities -Self-administer medications as prescribed -Attend all scheduled provider appointments -Continue utilizing Dexcom and take appropriate interventions when the  device alarms -Adhere to prescribed ADA/carb modified -Notify provider or care management team with questions and new concerns as needed   Follow Up Plan:  Will follow up next month        Patient Care Plan: Fall Risk (Adult)    Problem Identified: Fall Risk     Long-Range Goal: Maintain Function /Absence of Fall and Fall-Related Injury   Start Date: 11/14/2020  Expected End Date: 03/16/2021  Priority: High  Note:   Current Barriers:  . Fall Risk r/t Impaired Vision and Impaired Gait  Clinical Goal(s):  Marland Kitchen Over the next 120 days, patient will not experience falls or require emergent care d/t fall related injuries.  Interventions:  . Collaboration with [name] Hall., MD regarding development and update of comprehensive plan of care as evidenced by provider attestation and co-signature . Inter-disciplinary care team collaboration (see longitudinal plan of care) . Discussed treatment plan since hospitalization in May. [name] reports no falls since discharge. Reports that she is being monitored very closely at home.  She is currently receiving nursing, occupational therapy and physical therapy services with [facility]. [name] notes that the therapist described her gait as "guarded" and notes that her activity level has significantly declined d/t fear of falling. She is still able to remain home alone during the day with family members checking on her frequently.  . Discussed plan regarding possible long term needs. [name] reports they have agreed to engaging with the Palliative Care team and discussed need for assisted living if [name] Hall's functional ability declines.  They still feel that she would benefit from short term rehab d/t recent activity decline. Pending feedback from the physical therapist regarding anticipated length of in-home treatment.    Self-Care Deficits/Patient Goals:  -Utilize assistive device appropriately with all  ambulation -Change positions  slowly and wear non skid footwear when ambulating -Contact provider or the care management team with questions or new concerns   Follow Up Plan Will follow up next month        PLAN A member of the care management team will follow up next month.    [name] [facility] Care Management [facility] [phone]

## 2021-03-04 NOTE — Telephone Encounter (Signed)
Augmentin 875 twice daily for a week.  1 refill.  Can refer to wound care clinic if needed..  I have no openings to see her this week.

## 2021-03-04 NOTE — Telephone Encounter (Signed)
Mitzie Na RN with Home Health calling regarding recent foot wound,laceration from glass. Sutures removed 02/08/21 and Steris applied. Steri strips have started peeling off and  nurse states today wound is open left side of laceration, 2x5 cms dept 0.4cm "Looks white, like bone." Also reports areas with slough, swelling and painful. Noted swelling of that foot Monday when seen,  3+ edema of that foot. Afebrile  BP 170/88. No availability within protocol timeframe of 24 hours appt. Assured nurse and pt NT would route to practice for PCPs review and final disposition.  Please CB nurse Darnelle Bos, states she will relay messages to family.  CB# [phone]  Reason for Disposition . Other signs of wound infection  Answer Assessment - Initial Assessment Questions 1. LOCATION: "Where is the wound located?"       2. WOUND APPEARANCE: "What does the wound look like?"       3. SIZE: If redness is present, ask: "What is the size of the red area?" (Inches, centimeters, or compare to size of a coin)       4. SPREAD: "What's changed in the last day?"  "Do you see any red streaks coming from the wound?"      5. ONSET: "When did it start to look infected?"       6. MECHANISM: "How did the wound start, what was the cause?"      7. PAIN: "Is there any pain?" If Yes, ask: "How bad is the pain?"   (Scale 1-10; or mild, moderate, severe)     Yes, new 8. FEVER: "Do you have a fever?" If Yes, ask: "What is your temperature, how was it measured, and when did it start?"     no 9. OTHER SYMPTOMS: "Do you have any other symptoms?" (e.g., shaking chills, weakness, rash elsewhere on body)     *No Answer* 10. PREGNANCY: "Is there any chance you are pregnant?" "When was your last menstrual period?"       *No Answer*  Protocols used: WOUND INFECTION-A-AH

## 2021-03-04 NOTE — Telephone Encounter (Signed)
Home Health RN called back. Will relay message to pt's son and daughter. Will continue to monitor, next nurse visit is Friday. Please call prescription to:  [facility] So. No Name

## 2021-03-05 DIAGNOSIS — Z7902 Long term (current) use of antithrombotics/antiplatelets: Secondary | ICD-10-CM | POA: Diagnosis not present

## 2021-03-05 DIAGNOSIS — Z7982 Long term (current) use of aspirin: Secondary | ICD-10-CM | POA: Diagnosis not present

## 2021-03-05 DIAGNOSIS — Z7951 Long term (current) use of inhaled steroids: Secondary | ICD-10-CM | POA: Diagnosis not present

## 2021-03-05 DIAGNOSIS — E1122 Type 2 diabetes mellitus with diabetic chronic kidney disease: Secondary | ICD-10-CM | POA: Diagnosis not present

## 2021-03-05 DIAGNOSIS — E1142 Type 2 diabetes mellitus with diabetic polyneuropathy: Secondary | ICD-10-CM | POA: Diagnosis not present

## 2021-03-05 DIAGNOSIS — I2510 Atherosclerotic heart disease of native coronary artery without angina pectoris: Secondary | ICD-10-CM | POA: Diagnosis not present

## 2021-03-05 DIAGNOSIS — Z794 Long term (current) use of insulin: Secondary | ICD-10-CM | POA: Diagnosis not present

## 2021-03-05 DIAGNOSIS — I480 Paroxysmal atrial fibrillation: Secondary | ICD-10-CM | POA: Diagnosis not present

## 2021-03-05 DIAGNOSIS — K219 Gastro-esophageal reflux disease without esophagitis: Secondary | ICD-10-CM | POA: Diagnosis not present

## 2021-03-05 DIAGNOSIS — I129 Hypertensive chronic kidney disease with stage 1 through stage 4 chronic kidney disease, or unspecified chronic kidney disease: Secondary | ICD-10-CM | POA: Diagnosis not present

## 2021-03-05 DIAGNOSIS — D631 Anemia in chronic kidney disease: Secondary | ICD-10-CM | POA: Diagnosis not present

## 2021-03-05 DIAGNOSIS — J449 Chronic obstructive pulmonary disease, unspecified: Secondary | ICD-10-CM | POA: Diagnosis not present

## 2021-03-05 DIAGNOSIS — N1831 Chronic kidney disease, stage 3a: Secondary | ICD-10-CM | POA: Diagnosis not present

## 2021-03-05 DIAGNOSIS — U071 COVID-19: Secondary | ICD-10-CM | POA: Diagnosis not present

## 2021-03-05 DIAGNOSIS — G4700 Insomnia, unspecified: Secondary | ICD-10-CM | POA: Diagnosis not present

## 2021-03-05 DIAGNOSIS — M5136 Other intervertebral disc degeneration, lumbar region: Secondary | ICD-10-CM | POA: Diagnosis not present

## 2021-03-05 DIAGNOSIS — R1310 Dysphagia, unspecified: Secondary | ICD-10-CM | POA: Diagnosis not present

## 2021-03-05 DIAGNOSIS — S91012D Laceration without foreign body, left ankle, subsequent encounter: Secondary | ICD-10-CM | POA: Diagnosis not present

## 2021-03-05 DIAGNOSIS — F32A Depression, unspecified: Secondary | ICD-10-CM | POA: Diagnosis not present

## 2021-03-05 DIAGNOSIS — E1151 Type 2 diabetes mellitus with diabetic peripheral angiopathy without gangrene: Secondary | ICD-10-CM | POA: Diagnosis not present

## 2021-03-05 DIAGNOSIS — E7800 Pure hypercholesterolemia, unspecified: Secondary | ICD-10-CM | POA: Diagnosis not present

## 2021-03-09 ENCOUNTER — Telehealth: Payer: Self-pay

## 2021-03-09 DIAGNOSIS — S91012D Laceration without foreign body, left ankle, subsequent encounter: Secondary | ICD-10-CM | POA: Diagnosis not present

## 2021-03-09 DIAGNOSIS — R1310 Dysphagia, unspecified: Secondary | ICD-10-CM | POA: Diagnosis not present

## 2021-03-09 DIAGNOSIS — E7800 Pure hypercholesterolemia, unspecified: Secondary | ICD-10-CM | POA: Diagnosis not present

## 2021-03-09 DIAGNOSIS — Z7902 Long term (current) use of antithrombotics/antiplatelets: Secondary | ICD-10-CM | POA: Diagnosis not present

## 2021-03-09 DIAGNOSIS — K219 Gastro-esophageal reflux disease without esophagitis: Secondary | ICD-10-CM | POA: Diagnosis not present

## 2021-03-09 DIAGNOSIS — U071 COVID-19: Secondary | ICD-10-CM | POA: Diagnosis not present

## 2021-03-09 DIAGNOSIS — E1122 Type 2 diabetes mellitus with diabetic chronic kidney disease: Secondary | ICD-10-CM | POA: Diagnosis not present

## 2021-03-09 DIAGNOSIS — I2510 Atherosclerotic heart disease of native coronary artery without angina pectoris: Secondary | ICD-10-CM | POA: Diagnosis not present

## 2021-03-09 DIAGNOSIS — N1831 Chronic kidney disease, stage 3a: Secondary | ICD-10-CM | POA: Diagnosis not present

## 2021-03-09 DIAGNOSIS — D631 Anemia in chronic kidney disease: Secondary | ICD-10-CM | POA: Diagnosis not present

## 2021-03-09 DIAGNOSIS — J449 Chronic obstructive pulmonary disease, unspecified: Secondary | ICD-10-CM | POA: Diagnosis not present

## 2021-03-09 DIAGNOSIS — F32A Depression, unspecified: Secondary | ICD-10-CM | POA: Diagnosis not present

## 2021-03-09 DIAGNOSIS — M5136 Other intervertebral disc degeneration, lumbar region: Secondary | ICD-10-CM | POA: Diagnosis not present

## 2021-03-09 DIAGNOSIS — Z7982 Long term (current) use of aspirin: Secondary | ICD-10-CM | POA: Diagnosis not present

## 2021-03-09 DIAGNOSIS — Z794 Long term (current) use of insulin: Secondary | ICD-10-CM | POA: Diagnosis not present

## 2021-03-09 DIAGNOSIS — I129 Hypertensive chronic kidney disease with stage 1 through stage 4 chronic kidney disease, or unspecified chronic kidney disease: Secondary | ICD-10-CM | POA: Diagnosis not present

## 2021-03-09 DIAGNOSIS — G4700 Insomnia, unspecified: Secondary | ICD-10-CM | POA: Diagnosis not present

## 2021-03-09 DIAGNOSIS — E1151 Type 2 diabetes mellitus with diabetic peripheral angiopathy without gangrene: Secondary | ICD-10-CM | POA: Diagnosis not present

## 2021-03-09 DIAGNOSIS — Z7951 Long term (current) use of inhaled steroids: Secondary | ICD-10-CM | POA: Diagnosis not present

## 2021-03-09 DIAGNOSIS — E1142 Type 2 diabetes mellitus with diabetic polyneuropathy: Secondary | ICD-10-CM | POA: Diagnosis not present

## 2021-03-09 DIAGNOSIS — I480 Paroxysmal atrial fibrillation: Secondary | ICD-10-CM | POA: Diagnosis not present

## 2021-03-09 NOTE — Telephone Encounter (Signed)
Copied from [place] [id number]. Topic: Quick Communication - Home Health Verbal Orders >> Mar 09, 2021  3:04 PM Pawlus, Apolonio Schneiders wrote: Callback Number: Home health [phone] Requesting : PT Frequency: additional PT starting the week of July 4th / 1x3

## 2021-03-10 ENCOUNTER — Telehealth: Payer: Self-pay | Admitting: Family Medicine

## 2021-03-10 DIAGNOSIS — F32A Depression, unspecified: Secondary | ICD-10-CM | POA: Diagnosis not present

## 2021-03-10 DIAGNOSIS — Z7902 Long term (current) use of antithrombotics/antiplatelets: Secondary | ICD-10-CM | POA: Diagnosis not present

## 2021-03-10 DIAGNOSIS — I2510 Atherosclerotic heart disease of native coronary artery without angina pectoris: Secondary | ICD-10-CM | POA: Diagnosis not present

## 2021-03-10 DIAGNOSIS — E1142 Type 2 diabetes mellitus with diabetic polyneuropathy: Secondary | ICD-10-CM | POA: Diagnosis not present

## 2021-03-10 DIAGNOSIS — J449 Chronic obstructive pulmonary disease, unspecified: Secondary | ICD-10-CM | POA: Diagnosis not present

## 2021-03-10 DIAGNOSIS — S80821D Blister (nonthermal), right lower leg, subsequent encounter: Secondary | ICD-10-CM

## 2021-03-10 DIAGNOSIS — S91012D Laceration without foreign body, left ankle, subsequent encounter: Secondary | ICD-10-CM | POA: Diagnosis not present

## 2021-03-10 DIAGNOSIS — G4700 Insomnia, unspecified: Secondary | ICD-10-CM | POA: Diagnosis not present

## 2021-03-10 DIAGNOSIS — D631 Anemia in chronic kidney disease: Secondary | ICD-10-CM | POA: Diagnosis not present

## 2021-03-10 DIAGNOSIS — Z7982 Long term (current) use of aspirin: Secondary | ICD-10-CM | POA: Diagnosis not present

## 2021-03-10 DIAGNOSIS — Z7951 Long term (current) use of inhaled steroids: Secondary | ICD-10-CM | POA: Diagnosis not present

## 2021-03-10 DIAGNOSIS — I129 Hypertensive chronic kidney disease with stage 1 through stage 4 chronic kidney disease, or unspecified chronic kidney disease: Secondary | ICD-10-CM | POA: Diagnosis not present

## 2021-03-10 DIAGNOSIS — M5136 Other intervertebral disc degeneration, lumbar region: Secondary | ICD-10-CM | POA: Diagnosis not present

## 2021-03-10 DIAGNOSIS — R1310 Dysphagia, unspecified: Secondary | ICD-10-CM | POA: Diagnosis not present

## 2021-03-10 DIAGNOSIS — E1122 Type 2 diabetes mellitus with diabetic chronic kidney disease: Secondary | ICD-10-CM | POA: Diagnosis not present

## 2021-03-10 DIAGNOSIS — K219 Gastro-esophageal reflux disease without esophagitis: Secondary | ICD-10-CM | POA: Diagnosis not present

## 2021-03-10 DIAGNOSIS — E7800 Pure hypercholesterolemia, unspecified: Secondary | ICD-10-CM | POA: Diagnosis not present

## 2021-03-10 DIAGNOSIS — I480 Paroxysmal atrial fibrillation: Secondary | ICD-10-CM | POA: Diagnosis not present

## 2021-03-10 DIAGNOSIS — E1151 Type 2 diabetes mellitus with diabetic peripheral angiopathy without gangrene: Secondary | ICD-10-CM | POA: Diagnosis not present

## 2021-03-10 DIAGNOSIS — Z794 Long term (current) use of insulin: Secondary | ICD-10-CM | POA: Diagnosis not present

## 2021-03-10 DIAGNOSIS — N1831 Chronic kidney disease, stage 3a: Secondary | ICD-10-CM | POA: Diagnosis not present

## 2021-03-10 DIAGNOSIS — U071 COVID-19: Secondary | ICD-10-CM | POA: Diagnosis not present

## 2021-03-10 MED ORDER — AMOXICILLIN-POT CLAVULANATE 875-125 MG PO TABS
1.0000 | ORAL_TABLET | Freq: Two times a day (BID) | ORAL | 0 refills | Status: AC
Start: 2021-03-10 — End: 2021-03-13

## 2021-03-10 NOTE — Telephone Encounter (Signed)
Called and spoke to [facility]. She is requesting 2 things:  She would like to extend the treatment for the patient's wound for 3 more days. She says that the Augmentin has helped, but she still sees some infection. Can we send in another round into [facility]?   She would like verbal and signed orders for patient to start Poly M Max w/ silver for her wound care. She says that this is an antimicrobial that she will apply 2x weekly. She doesn't feel that a wound vac would be the best treatment for now. Her wound is in a bony area, and it would be painful for the patient. She will fax over this for you to sign.

## 2021-03-10 NOTE — Telephone Encounter (Signed)
Copied from [place] [id number]. Topic: General - Other >> Mar 10, 2021  9:30 AM Leward Quan A wrote: Reason for CRM: [name] with [facility] called in need to discuss wound care orders with Dr Marlan Palau nurse  please call [name] at  Ph# [phone]

## 2021-03-10 NOTE — Telephone Encounter (Signed)
Verbal okay was given. 

## 2021-03-10 NOTE — Telephone Encounter (Signed)
Medication sent in. Will await fax for signed orders.

## 2021-03-10 NOTE — Telephone Encounter (Signed)
Ok to order 

## 2021-03-13 DIAGNOSIS — E1142 Type 2 diabetes mellitus with diabetic polyneuropathy: Secondary | ICD-10-CM | POA: Diagnosis not present

## 2021-03-13 DIAGNOSIS — S91012D Laceration without foreign body, left ankle, subsequent encounter: Secondary | ICD-10-CM | POA: Diagnosis not present

## 2021-03-13 DIAGNOSIS — Z794 Long term (current) use of insulin: Secondary | ICD-10-CM | POA: Diagnosis not present

## 2021-03-13 DIAGNOSIS — E1151 Type 2 diabetes mellitus with diabetic peripheral angiopathy without gangrene: Secondary | ICD-10-CM | POA: Diagnosis not present

## 2021-03-13 DIAGNOSIS — K219 Gastro-esophageal reflux disease without esophagitis: Secondary | ICD-10-CM | POA: Diagnosis not present

## 2021-03-13 DIAGNOSIS — M5136 Other intervertebral disc degeneration, lumbar region: Secondary | ICD-10-CM | POA: Diagnosis not present

## 2021-03-13 DIAGNOSIS — U071 COVID-19: Secondary | ICD-10-CM | POA: Diagnosis not present

## 2021-03-13 DIAGNOSIS — J449 Chronic obstructive pulmonary disease, unspecified: Secondary | ICD-10-CM | POA: Diagnosis not present

## 2021-03-13 DIAGNOSIS — E1122 Type 2 diabetes mellitus with diabetic chronic kidney disease: Secondary | ICD-10-CM | POA: Diagnosis not present

## 2021-03-13 DIAGNOSIS — N1831 Chronic kidney disease, stage 3a: Secondary | ICD-10-CM | POA: Diagnosis not present

## 2021-03-13 DIAGNOSIS — F32A Depression, unspecified: Secondary | ICD-10-CM | POA: Diagnosis not present

## 2021-03-13 DIAGNOSIS — G4700 Insomnia, unspecified: Secondary | ICD-10-CM | POA: Diagnosis not present

## 2021-03-13 DIAGNOSIS — I2510 Atherosclerotic heart disease of native coronary artery without angina pectoris: Secondary | ICD-10-CM | POA: Diagnosis not present

## 2021-03-13 DIAGNOSIS — Z7902 Long term (current) use of antithrombotics/antiplatelets: Secondary | ICD-10-CM | POA: Diagnosis not present

## 2021-03-13 DIAGNOSIS — Z7982 Long term (current) use of aspirin: Secondary | ICD-10-CM | POA: Diagnosis not present

## 2021-03-13 DIAGNOSIS — D631 Anemia in chronic kidney disease: Secondary | ICD-10-CM | POA: Diagnosis not present

## 2021-03-13 DIAGNOSIS — R1310 Dysphagia, unspecified: Secondary | ICD-10-CM | POA: Diagnosis not present

## 2021-03-13 DIAGNOSIS — I480 Paroxysmal atrial fibrillation: Secondary | ICD-10-CM | POA: Diagnosis not present

## 2021-03-13 DIAGNOSIS — I129 Hypertensive chronic kidney disease with stage 1 through stage 4 chronic kidney disease, or unspecified chronic kidney disease: Secondary | ICD-10-CM | POA: Diagnosis not present

## 2021-03-13 DIAGNOSIS — E7800 Pure hypercholesterolemia, unspecified: Secondary | ICD-10-CM | POA: Diagnosis not present

## 2021-03-13 DIAGNOSIS — Z7951 Long term (current) use of inhaled steroids: Secondary | ICD-10-CM | POA: Diagnosis not present

## 2021-03-17 DIAGNOSIS — Z7902 Long term (current) use of antithrombotics/antiplatelets: Secondary | ICD-10-CM | POA: Diagnosis not present

## 2021-03-17 DIAGNOSIS — I129 Hypertensive chronic kidney disease with stage 1 through stage 4 chronic kidney disease, or unspecified chronic kidney disease: Secondary | ICD-10-CM | POA: Diagnosis not present

## 2021-03-17 DIAGNOSIS — Z794 Long term (current) use of insulin: Secondary | ICD-10-CM | POA: Diagnosis not present

## 2021-03-17 DIAGNOSIS — J449 Chronic obstructive pulmonary disease, unspecified: Secondary | ICD-10-CM | POA: Diagnosis not present

## 2021-03-17 DIAGNOSIS — N1831 Chronic kidney disease, stage 3a: Secondary | ICD-10-CM | POA: Diagnosis not present

## 2021-03-17 DIAGNOSIS — E1151 Type 2 diabetes mellitus with diabetic peripheral angiopathy without gangrene: Secondary | ICD-10-CM | POA: Diagnosis not present

## 2021-03-17 DIAGNOSIS — E1142 Type 2 diabetes mellitus with diabetic polyneuropathy: Secondary | ICD-10-CM | POA: Diagnosis not present

## 2021-03-17 DIAGNOSIS — U071 COVID-19: Secondary | ICD-10-CM | POA: Diagnosis not present

## 2021-03-17 DIAGNOSIS — K219 Gastro-esophageal reflux disease without esophagitis: Secondary | ICD-10-CM | POA: Diagnosis not present

## 2021-03-17 DIAGNOSIS — E1122 Type 2 diabetes mellitus with diabetic chronic kidney disease: Secondary | ICD-10-CM | POA: Diagnosis not present

## 2021-03-17 DIAGNOSIS — S91012D Laceration without foreign body, left ankle, subsequent encounter: Secondary | ICD-10-CM | POA: Diagnosis not present

## 2021-03-17 DIAGNOSIS — I480 Paroxysmal atrial fibrillation: Secondary | ICD-10-CM | POA: Diagnosis not present

## 2021-03-17 DIAGNOSIS — D631 Anemia in chronic kidney disease: Secondary | ICD-10-CM | POA: Diagnosis not present

## 2021-03-17 DIAGNOSIS — R1310 Dysphagia, unspecified: Secondary | ICD-10-CM | POA: Diagnosis not present

## 2021-03-17 DIAGNOSIS — E7800 Pure hypercholesterolemia, unspecified: Secondary | ICD-10-CM | POA: Diagnosis not present

## 2021-03-17 DIAGNOSIS — Z7951 Long term (current) use of inhaled steroids: Secondary | ICD-10-CM | POA: Diagnosis not present

## 2021-03-17 DIAGNOSIS — Z7982 Long term (current) use of aspirin: Secondary | ICD-10-CM | POA: Diagnosis not present

## 2021-03-17 DIAGNOSIS — M5136 Other intervertebral disc degeneration, lumbar region: Secondary | ICD-10-CM | POA: Diagnosis not present

## 2021-03-17 DIAGNOSIS — I2510 Atherosclerotic heart disease of native coronary artery without angina pectoris: Secondary | ICD-10-CM | POA: Diagnosis not present

## 2021-03-17 DIAGNOSIS — G4700 Insomnia, unspecified: Secondary | ICD-10-CM | POA: Diagnosis not present

## 2021-03-17 DIAGNOSIS — F32A Depression, unspecified: Secondary | ICD-10-CM | POA: Diagnosis not present

## 2021-03-20 ENCOUNTER — Telehealth: Payer: Self-pay | Admitting: Family Medicine

## 2021-03-20 DIAGNOSIS — D631 Anemia in chronic kidney disease: Secondary | ICD-10-CM | POA: Diagnosis not present

## 2021-03-20 DIAGNOSIS — I2510 Atherosclerotic heart disease of native coronary artery without angina pectoris: Secondary | ICD-10-CM | POA: Diagnosis not present

## 2021-03-20 DIAGNOSIS — E1151 Type 2 diabetes mellitus with diabetic peripheral angiopathy without gangrene: Secondary | ICD-10-CM | POA: Diagnosis not present

## 2021-03-20 DIAGNOSIS — R1310 Dysphagia, unspecified: Secondary | ICD-10-CM | POA: Diagnosis not present

## 2021-03-20 DIAGNOSIS — L089 Local infection of the skin and subcutaneous tissue, unspecified: Secondary | ICD-10-CM

## 2021-03-20 DIAGNOSIS — E7800 Pure hypercholesterolemia, unspecified: Secondary | ICD-10-CM | POA: Diagnosis not present

## 2021-03-20 DIAGNOSIS — I480 Paroxysmal atrial fibrillation: Secondary | ICD-10-CM | POA: Diagnosis not present

## 2021-03-20 DIAGNOSIS — Z794 Long term (current) use of insulin: Secondary | ICD-10-CM | POA: Diagnosis not present

## 2021-03-20 DIAGNOSIS — K219 Gastro-esophageal reflux disease without esophagitis: Secondary | ICD-10-CM | POA: Diagnosis not present

## 2021-03-20 DIAGNOSIS — N1831 Chronic kidney disease, stage 3a: Secondary | ICD-10-CM | POA: Diagnosis not present

## 2021-03-20 DIAGNOSIS — Z7902 Long term (current) use of antithrombotics/antiplatelets: Secondary | ICD-10-CM | POA: Diagnosis not present

## 2021-03-20 DIAGNOSIS — G4700 Insomnia, unspecified: Secondary | ICD-10-CM | POA: Diagnosis not present

## 2021-03-20 DIAGNOSIS — Z7982 Long term (current) use of aspirin: Secondary | ICD-10-CM | POA: Diagnosis not present

## 2021-03-20 DIAGNOSIS — F32A Depression, unspecified: Secondary | ICD-10-CM | POA: Diagnosis not present

## 2021-03-20 DIAGNOSIS — E1122 Type 2 diabetes mellitus with diabetic chronic kidney disease: Secondary | ICD-10-CM | POA: Diagnosis not present

## 2021-03-20 DIAGNOSIS — J449 Chronic obstructive pulmonary disease, unspecified: Secondary | ICD-10-CM | POA: Diagnosis not present

## 2021-03-20 DIAGNOSIS — I129 Hypertensive chronic kidney disease with stage 1 through stage 4 chronic kidney disease, or unspecified chronic kidney disease: Secondary | ICD-10-CM | POA: Diagnosis not present

## 2021-03-20 DIAGNOSIS — M5136 Other intervertebral disc degeneration, lumbar region: Secondary | ICD-10-CM | POA: Diagnosis not present

## 2021-03-20 DIAGNOSIS — S91012D Laceration without foreign body, left ankle, subsequent encounter: Secondary | ICD-10-CM | POA: Diagnosis not present

## 2021-03-20 DIAGNOSIS — E1142 Type 2 diabetes mellitus with diabetic polyneuropathy: Secondary | ICD-10-CM | POA: Diagnosis not present

## 2021-03-20 DIAGNOSIS — U071 COVID-19: Secondary | ICD-10-CM | POA: Diagnosis not present

## 2021-03-20 DIAGNOSIS — Z7951 Long term (current) use of inhaled steroids: Secondary | ICD-10-CM | POA: Diagnosis not present

## 2021-03-20 NOTE — Telephone Encounter (Signed)
Advised [name] as below. Referral placed.

## 2021-03-20 NOTE — Telephone Encounter (Signed)
Possible wound infection on left ankle/ has significant amount Slough and sour odor/ [name] wants to see if she can apply silver cell and wants to request a wound center consult/ please advise asap

## 2021-03-20 NOTE — Telephone Encounter (Signed)
May use Silvercel dressing for infected wound. Change daily for 3-4 days then call report. Schedule referral to wound center.

## 2021-03-22 DIAGNOSIS — D631 Anemia in chronic kidney disease: Secondary | ICD-10-CM | POA: Diagnosis not present

## 2021-03-22 DIAGNOSIS — Z7902 Long term (current) use of antithrombotics/antiplatelets: Secondary | ICD-10-CM | POA: Diagnosis not present

## 2021-03-22 DIAGNOSIS — K219 Gastro-esophageal reflux disease without esophagitis: Secondary | ICD-10-CM | POA: Diagnosis not present

## 2021-03-22 DIAGNOSIS — Z7982 Long term (current) use of aspirin: Secondary | ICD-10-CM | POA: Diagnosis not present

## 2021-03-22 DIAGNOSIS — Z7951 Long term (current) use of inhaled steroids: Secondary | ICD-10-CM | POA: Diagnosis not present

## 2021-03-22 DIAGNOSIS — M5136 Other intervertebral disc degeneration, lumbar region: Secondary | ICD-10-CM | POA: Diagnosis not present

## 2021-03-22 DIAGNOSIS — E1122 Type 2 diabetes mellitus with diabetic chronic kidney disease: Secondary | ICD-10-CM | POA: Diagnosis not present

## 2021-03-22 DIAGNOSIS — I480 Paroxysmal atrial fibrillation: Secondary | ICD-10-CM | POA: Diagnosis not present

## 2021-03-22 DIAGNOSIS — S91012D Laceration without foreign body, left ankle, subsequent encounter: Secondary | ICD-10-CM | POA: Diagnosis not present

## 2021-03-22 DIAGNOSIS — G4700 Insomnia, unspecified: Secondary | ICD-10-CM | POA: Diagnosis not present

## 2021-03-22 DIAGNOSIS — N1831 Chronic kidney disease, stage 3a: Secondary | ICD-10-CM | POA: Diagnosis not present

## 2021-03-22 DIAGNOSIS — E1142 Type 2 diabetes mellitus with diabetic polyneuropathy: Secondary | ICD-10-CM | POA: Diagnosis not present

## 2021-03-22 DIAGNOSIS — I2510 Atherosclerotic heart disease of native coronary artery without angina pectoris: Secondary | ICD-10-CM | POA: Diagnosis not present

## 2021-03-22 DIAGNOSIS — J449 Chronic obstructive pulmonary disease, unspecified: Secondary | ICD-10-CM | POA: Diagnosis not present

## 2021-03-22 DIAGNOSIS — R1310 Dysphagia, unspecified: Secondary | ICD-10-CM | POA: Diagnosis not present

## 2021-03-22 DIAGNOSIS — F32A Depression, unspecified: Secondary | ICD-10-CM | POA: Diagnosis not present

## 2021-03-22 DIAGNOSIS — E7800 Pure hypercholesterolemia, unspecified: Secondary | ICD-10-CM | POA: Diagnosis not present

## 2021-03-22 DIAGNOSIS — I129 Hypertensive chronic kidney disease with stage 1 through stage 4 chronic kidney disease, or unspecified chronic kidney disease: Secondary | ICD-10-CM | POA: Diagnosis not present

## 2021-03-22 DIAGNOSIS — U071 COVID-19: Secondary | ICD-10-CM | POA: Diagnosis not present

## 2021-03-22 DIAGNOSIS — Z794 Long term (current) use of insulin: Secondary | ICD-10-CM | POA: Diagnosis not present

## 2021-03-22 DIAGNOSIS — E1151 Type 2 diabetes mellitus with diabetic peripheral angiopathy without gangrene: Secondary | ICD-10-CM | POA: Diagnosis not present

## 2021-03-24 ENCOUNTER — Encounter (INDEPENDENT_AMBULATORY_CARE_PROVIDER_SITE_OTHER): Payer: Medicare Other

## 2021-03-24 ENCOUNTER — Ambulatory Visit (INDEPENDENT_AMBULATORY_CARE_PROVIDER_SITE_OTHER): Payer: Medicare Other | Admitting: Vascular Surgery

## 2021-03-25 DIAGNOSIS — Z794 Long term (current) use of insulin: Secondary | ICD-10-CM | POA: Diagnosis not present

## 2021-03-25 DIAGNOSIS — F32A Depression, unspecified: Secondary | ICD-10-CM | POA: Diagnosis not present

## 2021-03-25 DIAGNOSIS — J449 Chronic obstructive pulmonary disease, unspecified: Secondary | ICD-10-CM | POA: Diagnosis not present

## 2021-03-25 DIAGNOSIS — G4700 Insomnia, unspecified: Secondary | ICD-10-CM | POA: Diagnosis not present

## 2021-03-25 DIAGNOSIS — Z7982 Long term (current) use of aspirin: Secondary | ICD-10-CM | POA: Diagnosis not present

## 2021-03-25 DIAGNOSIS — U071 COVID-19: Secondary | ICD-10-CM | POA: Diagnosis not present

## 2021-03-25 DIAGNOSIS — E1142 Type 2 diabetes mellitus with diabetic polyneuropathy: Secondary | ICD-10-CM | POA: Diagnosis not present

## 2021-03-25 DIAGNOSIS — E1122 Type 2 diabetes mellitus with diabetic chronic kidney disease: Secondary | ICD-10-CM | POA: Diagnosis not present

## 2021-03-25 DIAGNOSIS — E1151 Type 2 diabetes mellitus with diabetic peripheral angiopathy without gangrene: Secondary | ICD-10-CM | POA: Diagnosis not present

## 2021-03-25 DIAGNOSIS — M5136 Other intervertebral disc degeneration, lumbar region: Secondary | ICD-10-CM | POA: Diagnosis not present

## 2021-03-25 DIAGNOSIS — K219 Gastro-esophageal reflux disease without esophagitis: Secondary | ICD-10-CM | POA: Diagnosis not present

## 2021-03-25 DIAGNOSIS — Z7951 Long term (current) use of inhaled steroids: Secondary | ICD-10-CM | POA: Diagnosis not present

## 2021-03-25 DIAGNOSIS — I2510 Atherosclerotic heart disease of native coronary artery without angina pectoris: Secondary | ICD-10-CM | POA: Diagnosis not present

## 2021-03-25 DIAGNOSIS — I480 Paroxysmal atrial fibrillation: Secondary | ICD-10-CM | POA: Diagnosis not present

## 2021-03-25 DIAGNOSIS — I129 Hypertensive chronic kidney disease with stage 1 through stage 4 chronic kidney disease, or unspecified chronic kidney disease: Secondary | ICD-10-CM | POA: Diagnosis not present

## 2021-03-25 DIAGNOSIS — D631 Anemia in chronic kidney disease: Secondary | ICD-10-CM | POA: Diagnosis not present

## 2021-03-25 DIAGNOSIS — Z7902 Long term (current) use of antithrombotics/antiplatelets: Secondary | ICD-10-CM | POA: Diagnosis not present

## 2021-03-25 DIAGNOSIS — N1831 Chronic kidney disease, stage 3a: Secondary | ICD-10-CM | POA: Diagnosis not present

## 2021-03-25 DIAGNOSIS — R1310 Dysphagia, unspecified: Secondary | ICD-10-CM | POA: Diagnosis not present

## 2021-03-25 DIAGNOSIS — S91012D Laceration without foreign body, left ankle, subsequent encounter: Secondary | ICD-10-CM | POA: Diagnosis not present

## 2021-03-25 DIAGNOSIS — E7800 Pure hypercholesterolemia, unspecified: Secondary | ICD-10-CM | POA: Diagnosis not present

## 2021-03-27 DIAGNOSIS — K219 Gastro-esophageal reflux disease without esophagitis: Secondary | ICD-10-CM | POA: Diagnosis not present

## 2021-03-27 DIAGNOSIS — I2510 Atherosclerotic heart disease of native coronary artery without angina pectoris: Secondary | ICD-10-CM | POA: Diagnosis not present

## 2021-03-27 DIAGNOSIS — D631 Anemia in chronic kidney disease: Secondary | ICD-10-CM | POA: Diagnosis not present

## 2021-03-27 DIAGNOSIS — Z794 Long term (current) use of insulin: Secondary | ICD-10-CM | POA: Diagnosis not present

## 2021-03-27 DIAGNOSIS — Z7902 Long term (current) use of antithrombotics/antiplatelets: Secondary | ICD-10-CM | POA: Diagnosis not present

## 2021-03-27 DIAGNOSIS — U071 COVID-19: Secondary | ICD-10-CM | POA: Diagnosis not present

## 2021-03-27 DIAGNOSIS — I480 Paroxysmal atrial fibrillation: Secondary | ICD-10-CM | POA: Diagnosis not present

## 2021-03-27 DIAGNOSIS — Z7951 Long term (current) use of inhaled steroids: Secondary | ICD-10-CM | POA: Diagnosis not present

## 2021-03-27 DIAGNOSIS — G4700 Insomnia, unspecified: Secondary | ICD-10-CM | POA: Diagnosis not present

## 2021-03-27 DIAGNOSIS — E7800 Pure hypercholesterolemia, unspecified: Secondary | ICD-10-CM | POA: Diagnosis not present

## 2021-03-27 DIAGNOSIS — N1831 Chronic kidney disease, stage 3a: Secondary | ICD-10-CM | POA: Diagnosis not present

## 2021-03-27 DIAGNOSIS — R1310 Dysphagia, unspecified: Secondary | ICD-10-CM | POA: Diagnosis not present

## 2021-03-27 DIAGNOSIS — E1151 Type 2 diabetes mellitus with diabetic peripheral angiopathy without gangrene: Secondary | ICD-10-CM | POA: Diagnosis not present

## 2021-03-27 DIAGNOSIS — I129 Hypertensive chronic kidney disease with stage 1 through stage 4 chronic kidney disease, or unspecified chronic kidney disease: Secondary | ICD-10-CM | POA: Diagnosis not present

## 2021-03-27 DIAGNOSIS — M5136 Other intervertebral disc degeneration, lumbar region: Secondary | ICD-10-CM | POA: Diagnosis not present

## 2021-03-27 DIAGNOSIS — S91012D Laceration without foreign body, left ankle, subsequent encounter: Secondary | ICD-10-CM | POA: Diagnosis not present

## 2021-03-27 DIAGNOSIS — E1142 Type 2 diabetes mellitus with diabetic polyneuropathy: Secondary | ICD-10-CM | POA: Diagnosis not present

## 2021-03-27 DIAGNOSIS — F32A Depression, unspecified: Secondary | ICD-10-CM | POA: Diagnosis not present

## 2021-03-27 DIAGNOSIS — Z7982 Long term (current) use of aspirin: Secondary | ICD-10-CM | POA: Diagnosis not present

## 2021-03-27 DIAGNOSIS — E1122 Type 2 diabetes mellitus with diabetic chronic kidney disease: Secondary | ICD-10-CM | POA: Diagnosis not present

## 2021-03-27 DIAGNOSIS — J449 Chronic obstructive pulmonary disease, unspecified: Secondary | ICD-10-CM | POA: Diagnosis not present

## 2021-04-01 ENCOUNTER — Other Ambulatory Visit: Payer: Self-pay | Admitting: Adult Health

## 2021-04-01 ENCOUNTER — Other Ambulatory Visit: Payer: Self-pay | Admitting: Family Medicine

## 2021-04-01 ENCOUNTER — Encounter: Payer: Medicare Other | Attending: Internal Medicine | Admitting: Internal Medicine

## 2021-04-01 ENCOUNTER — Other Ambulatory Visit: Payer: Self-pay | Admitting: Internal Medicine

## 2021-04-01 ENCOUNTER — Other Ambulatory Visit: Payer: Self-pay

## 2021-04-01 DIAGNOSIS — E1142 Type 2 diabetes mellitus with diabetic polyneuropathy: Secondary | ICD-10-CM | POA: Diagnosis not present

## 2021-04-01 DIAGNOSIS — E1122 Type 2 diabetes mellitus with diabetic chronic kidney disease: Secondary | ICD-10-CM | POA: Diagnosis not present

## 2021-04-01 DIAGNOSIS — K219 Gastro-esophageal reflux disease without esophagitis: Secondary | ICD-10-CM | POA: Diagnosis not present

## 2021-04-01 DIAGNOSIS — E1151 Type 2 diabetes mellitus with diabetic peripheral angiopathy without gangrene: Secondary | ICD-10-CM | POA: Diagnosis not present

## 2021-04-01 DIAGNOSIS — Z7982 Long term (current) use of aspirin: Secondary | ICD-10-CM | POA: Diagnosis not present

## 2021-04-01 DIAGNOSIS — D631 Anemia in chronic kidney disease: Secondary | ICD-10-CM | POA: Diagnosis not present

## 2021-04-01 DIAGNOSIS — J449 Chronic obstructive pulmonary disease, unspecified: Secondary | ICD-10-CM | POA: Diagnosis not present

## 2021-04-01 DIAGNOSIS — I872 Venous insufficiency (chronic) (peripheral): Secondary | ICD-10-CM | POA: Insufficient documentation

## 2021-04-01 DIAGNOSIS — E7800 Pure hypercholesterolemia, unspecified: Secondary | ICD-10-CM | POA: Diagnosis not present

## 2021-04-01 DIAGNOSIS — S91012S Laceration without foreign body, left ankle, sequela: Secondary | ICD-10-CM | POA: Diagnosis not present

## 2021-04-01 DIAGNOSIS — Z794 Long term (current) use of insulin: Secondary | ICD-10-CM | POA: Diagnosis not present

## 2021-04-01 DIAGNOSIS — S91012A Laceration without foreign body, left ankle, initial encounter: Secondary | ICD-10-CM | POA: Diagnosis present

## 2021-04-01 DIAGNOSIS — L97329 Non-pressure chronic ulcer of left ankle with unspecified severity: Secondary | ICD-10-CM | POA: Diagnosis not present

## 2021-04-01 DIAGNOSIS — T8133XA Disruption of traumatic injury wound repair, initial encounter: Secondary | ICD-10-CM | POA: Diagnosis not present

## 2021-04-01 DIAGNOSIS — S91012D Laceration without foreign body, left ankle, subsequent encounter: Secondary | ICD-10-CM | POA: Diagnosis not present

## 2021-04-01 DIAGNOSIS — F32A Depression, unspecified: Secondary | ICD-10-CM | POA: Diagnosis not present

## 2021-04-01 DIAGNOSIS — Z7951 Long term (current) use of inhaled steroids: Secondary | ICD-10-CM | POA: Diagnosis not present

## 2021-04-01 DIAGNOSIS — S81802A Unspecified open wound, left lower leg, initial encounter: Secondary | ICD-10-CM

## 2021-04-01 DIAGNOSIS — E11621 Type 2 diabetes mellitus with foot ulcer: Secondary | ICD-10-CM | POA: Insufficient documentation

## 2021-04-01 DIAGNOSIS — I129 Hypertensive chronic kidney disease with stage 1 through stage 4 chronic kidney disease, or unspecified chronic kidney disease: Secondary | ICD-10-CM | POA: Diagnosis not present

## 2021-04-01 DIAGNOSIS — N1831 Chronic kidney disease, stage 3a: Secondary | ICD-10-CM | POA: Diagnosis not present

## 2021-04-01 DIAGNOSIS — Z7902 Long term (current) use of antithrombotics/antiplatelets: Secondary | ICD-10-CM | POA: Diagnosis not present

## 2021-04-01 DIAGNOSIS — G4700 Insomnia, unspecified: Secondary | ICD-10-CM | POA: Diagnosis not present

## 2021-04-01 DIAGNOSIS — W010XXS Fall on same level from slipping, tripping and stumbling without subsequent striking against object, sequela: Secondary | ICD-10-CM | POA: Diagnosis not present

## 2021-04-01 DIAGNOSIS — I480 Paroxysmal atrial fibrillation: Secondary | ICD-10-CM | POA: Diagnosis not present

## 2021-04-01 DIAGNOSIS — M5136 Other intervertebral disc degeneration, lumbar region: Secondary | ICD-10-CM | POA: Diagnosis not present

## 2021-04-01 DIAGNOSIS — L97322 Non-pressure chronic ulcer of left ankle with fat layer exposed: Secondary | ICD-10-CM | POA: Diagnosis not present

## 2021-04-01 DIAGNOSIS — E1140 Type 2 diabetes mellitus with diabetic neuropathy, unspecified: Secondary | ICD-10-CM | POA: Diagnosis not present

## 2021-04-01 DIAGNOSIS — U071 COVID-19: Secondary | ICD-10-CM | POA: Diagnosis not present

## 2021-04-01 DIAGNOSIS — R1310 Dysphagia, unspecified: Secondary | ICD-10-CM | POA: Diagnosis not present

## 2021-04-01 DIAGNOSIS — I2510 Atherosclerotic heart disease of native coronary artery without angina pectoris: Secondary | ICD-10-CM | POA: Diagnosis not present

## 2021-04-01 NOTE — Progress Notes (Addendum)
Tammy, Hall ([id number]) Visit Report for 04/01/2021 Allergy List Details Patient Name: [name], [name] Date of Service: 04/01/2021 10:15 AM Medical Record Number: [id number] Patient Account Number: [id number] Date of Birth/Sex: Sep 19, 1929 (87 y.o. F) Treating RN: Dolan Amen Primary Care Cerrone Debold: Miguel Aschoff Other Clinician: Referring Winifred Bodiford: Miguel Aschoff Treating Shanika Levings/Extender: Yaakov Guthrie in Treatment: 0 Allergies Active Allergies latex bacitracin lidocaine Aricept benzalkonium ibuprofen valdecoxib albuterol tape, permeable adhesive triamcinolone Allergy Notes Electronic Signature(s) Signed: 04/01/2021 5:07:08 PM By: Dolan Amen RN Entered By: Dolan Amen on 04/01/2021 10:52:32 Moseman, Derenda Carlean Jews ([id number]) -------------------------------------------------------------------------------- Arrival Information Details Patient Name: [name], [name] Date of Service: 04/01/2021 10:15 AM Medical Record Number: [id number] Patient Account Number: [id number] Date of Birth/Sex: 1929/08/15 (87 y.o. F) Treating RN: Dolan Amen Primary Care Shanasia Ibrahim: Miguel Aschoff Other Clinician: Referring Trini Christiansen: Miguel Aschoff Treating Arianny Pun/Extender: Yaakov Guthrie in Treatment: 0 Visit Information Patient Arrived: Charlyn Minerva Time: 10:48 Accompanied By: Derek Jack Transfer Assistance: None Patient Identification Verified: Yes Secondary Verification Process Completed: Yes Patient Has Alerts: Yes Patient Alerts: Patient on Blood Thinner ***PLAVIX*** ALLERGIC TO LIDOCAINE Electronic Signature(s) Signed: 04/01/2021 11:26:23 AM By: Dolan Amen RN Entered By: Dolan Amen on 04/01/2021 11:26:23 Rambo, Nathanial Rancher ([id number]) -------------------------------------------------------------------------------- Clinic Level of Care Assessment Details Patient Name: Hall, [name] Date of Service:  04/01/2021 10:15 AM Medical Record Number: [id number] Patient Account Number: [id number] Date of Birth/Sex: [birth year] (87 y.o. F) Treating RN: Dolan Amen Primary Care Wilho Sharpley: Miguel Aschoff Other Clinician: Referring Verlie Hellenbrand: Miguel Aschoff Treating Wadie Mattie/Extender: Yaakov Guthrie in Treatment: 0 Clinic Level of Care Assessment Items TOOL 1 Quantity Score X - Use when EandM and Procedure is performed on INITIAL visit 1 0 ASSESSMENTS - Nursing Assessment / Reassessment X - General Physical Exam (combine w/ comprehensive assessment (listed just below) when performed on new 1 20 pt. evals) X- 1 25 Comprehensive Assessment (HX, ROS, Risk Assessments, Wounds Hx, etc.) ASSESSMENTS - Wound and Skin Assessment / Reassessment X - Dermatologic / Skin Assessment (not related to wound area) 1 10 ASSESSMENTS - Ostomy and/or Continence Assessment and Care []  - Incontinence Assessment and Management 0 []  - 0 Ostomy Care Assessment and Management (repouching, etc.) PROCESS - Coordination of Care X - Simple Patient / Family Education for ongoing care 1 15 []  - 0 Complex (extensive) Patient / Family Education for ongoing care X- 1 10 Staff obtains Consents, Records, Test Results / Process Orders []  - 0 Staff telephones HHA, Nursing Homes / Clarify orders / etc []  - 0 Routine Transfer to another Facility (non-emergent condition) []  - 0 Routine Hospital Admission (non-emergent condition) []  - 0 New Admissions / Biomedical engineer / Ordering NPWT, Apligraf, etc. []  - 0 Emergency Hospital Admission (emergent condition) PROCESS - Special Needs []  - Pediatric / Minor Patient Management 0 []  - 0 Isolation Patient Management []  - 0 Hearing / Language / Visual special needs []  - 0 Assessment of Community assistance (transportation, D/C planning, etc.) []  - 0 Additional assistance / Altered mentation []  - 0 Support Surface(s) Assessment (bed, cushion, seat,  etc.) INTERVENTIONS - Miscellaneous []  - External ear exam 0 []  - 0 Patient Transfer (multiple staff / Civil Service fast streamer / Similar devices) []  - 0 Simple Staple / Suture removal (25 or less) []  - 0 Complex Staple / Suture removal (26 or more) []  - 0 Hypo/Hyperglycemic Management (do not check if billed separately) X- 1 15 Ankle / Brachial Index (ABI) - do not check if billed separately Has  the patient been seen at the hospital within the last three years: Yes Total Score: 95 Level Of Care: New/Established - Level 3 Whitt, Suzette L. ([id number]) Electronic Signature(s) Signed: 04/01/2021 5:07:08 PM By: Dolan Amen RN Entered By: Dolan Amen on 04/01/2021 11:57:08 Recendez, Saidi L. ([id number]) -------------------------------------------------------------------------------- Lower Extremity Assessment Details Patient Name: Hall, [name] Date of Service: 04/01/2021 10:15 AM Medical Record Number: [id number] Patient Account Number: [id number] Date of Birth/Sex: [birth year] (87 y.o. F) Treating RN: Dolan Amen Primary Care Ralpheal Zappone: Miguel Aschoff Other Clinician: Referring Risha Barretta: Miguel Aschoff Treating Arabel Barcenas/Extender: Yaakov Guthrie in Treatment: 0 Edema Assessment Assessed: Shirlyn Goltz: Yes] [Right: Yes] Edema: [Left: Yes] [Right: No] Vascular Assessment Pulses: Dorsalis Pedis Palpable: [Left:No Yes] [Right:Yes] Notes Patient noncompressible in office today Electronic Signature(s) Signed: 04/01/2021 5:07:08 PM By: Dolan Amen RN Entered By: Dolan Amen on 04/01/2021 11:19:12 Goucher, Morrigan L. ([id number]) -------------------------------------------------------------------------------- Multi Wound Chart Details Patient Name: Hall, [name] Date of Service: 04/01/2021 10:15 AM Medical Record Number: [id number] Patient Account Number: [id number] Date of Birth/Sex: January 19, 1929 (87 y.o. F) Treating RN: Dolan Amen Primary Care  Maylea Soria: Miguel Aschoff Other Clinician: Referring Onika Gudiel: Miguel Aschoff Treating Neima Lacross/Extender: Yaakov Guthrie in Treatment: 0 Vital Signs Height(in): 37 Pulse(bpm): 70 Weight(lbs): Blood Pressure(mmHg): 157/77 Body Mass Index(BMI): Temperature(F): 98.3 Respiratory Rate(breaths/min): 18 Photos: [N/A:N/A] Wound Location: Left, Lateral Malleolus Left Ankle N/A Wounding Event: Surgical Injury Skin Tear/Laceration N/A Primary Etiology: Open Surgical Wound Trauma, Other N/A Comorbid History: Chronic Obstructive Pulmonary Chronic Obstructive Pulmonary N/A Disease (COPD), Arrhythmia, Disease (COPD), Arrhythmia, Coronary Artery Disease, Coronary Artery Disease, Hypertension, Type II Diabetes, Hypertension, Type II Diabetes, Osteoarthritis, Neuropathy Osteoarthritis, Neuropathy Date Acquired: 02/08/2021 02/08/2021 N/A Weeks of Treatment: 0 0 N/A Wound Status: Open Open N/A Measurements L x W x D (cm) 1.2x1x0.8 1.2x0.8x0.1 N/A Area (cm) : 0.942 0.754 N/A Volume (cm) : 0.754 0.075 N/A % Reduction in Area: 0.00% 0.00% N/A % Reduction in Volume: 0.00% 0.00% N/A Classification: Full Thickness Without Exposed Full Thickness Without Exposed N/A Support Structures Support Structures Exudate Amount: Medium Medium N/A Exudate Type: Serosanguineous Sanguinous N/A Exudate Color: red, brown red N/A Granulation Amount: None Present (0%) Large (67-100%) N/A Granulation Quality: N/A Red N/A Necrotic Amount: Large (67-100%) None Present (0%) N/A Exposed Structures: Fat Layer (Subcutaneous Tissue): Fat Layer (Subcutaneous Tissue): N/A Yes Yes Fascia: No Fascia: No Tendon: No Tendon: No Muscle: No Muscle: No Joint: No Joint: No Bone: No Bone: No Epithelialization: None None N/A Debridement: Debridement - Selective/Open N/A N/A Wound Pre-procedure Verification/Time 11:45 N/A N/A Out Taken: Tissue Debrided: Slough N/A N/A Level: Non-Viable Tissue N/A N/A Debridement  Area (sq cm): 1.2 N/A N/A Instrument: Curette N/A N/A Bleeding: Minimum N/A N/A Hemostasis Achieved: Pressure N/A N/A Procedure was tolerated well N/A N/A Carr, Bryony L. ([id number]) Debridement Treatment Response: Post Debridement 1.2x1x1 N/A N/A Measurements L x W x D (cm) Post Debridement Volume: 0.942 N/A N/A (cm) Procedures Performed: Debridement N/A N/A Treatment Notes Electronic Signature(s) Signed: 04/06/2021 4:36:21 PM By: Kalman Shan DO Previous Signature: 04/01/2021 5:07:08 PM Version By: Dolan Amen RN Entered By: Kalman Shan on 04/06/2021 16:30:06 Engram, Nathanial Rancher ([id number]) -------------------------------------------------------------------------------- Multi-Disciplinary Care Plan Details Patient Name: [name], Rozena L. Date of Service: 04/01/2021 10:15 AM Medical Record Number: [id number] Patient Account Number: [id number] Date of Birth/Sex: May 27, 1929 (87 y.o. F) Treating RN: Dolan Amen Primary Care Aragon Scarantino: Miguel Aschoff Other Clinician: Referring Jazyiah Yiu: Miguel Aschoff Treating Ranvir Renovato/Extender: Yaakov Guthrie in Treatment: 0 Active Inactive Orientation to the  Wound Care Program Nursing Diagnoses: Knowledge deficit related to the wound healing center program Goals: Patient/caregiver will verbalize understanding of the [place] Date Initiated: 04/01/2021 Target Resolution Date: 04/01/2021 Goal Status: Active Interventions: Provide education on orientation to the wound center Notes: Wound/Skin Impairment Nursing Diagnoses: Impaired tissue integrity Goals: Patient/caregiver will verbalize understanding of skin care regimen Date Initiated: 04/01/2021 Target Resolution Date: 04/01/2021 Goal Status: Active Ulcer/skin breakdown will have a volume reduction of 30% by week 4 Date Initiated: 04/01/2021 Target Resolution Date: 05/02/2021 Goal Status: Active Ulcer/skin breakdown will have a volume  reduction of 50% by week 8 Date Initiated: 04/01/2021 Target Resolution Date: 06/02/2021 Goal Status: Active Ulcer/skin breakdown will have a volume reduction of 80% by week 12 Date Initiated: 04/01/2021 Target Resolution Date: 07/02/2021 Goal Status: Active Ulcer/skin breakdown will heal within 14 weeks Date Initiated: 04/01/2021 Target Resolution Date: 08/02/2021 Goal Status: Active Interventions: Assess patient/caregiver ability to obtain necessary supplies Assess patient/caregiver ability to perform ulcer/skin care regimen upon admission and as needed Assess ulceration(s) every visit Provide education on ulcer and skin care Treatment Activities: Patient referred to home care : 04/01/2021 Skin care regimen initiated : 04/01/2021 Notes: Electronic Signature(s) Signed: 04/01/2021 5:07:08 PM By: Dolan Amen RN Entered By: Dolan Amen on 04/01/2021 11:43:15 Dukeman, Christinna L. ([id number]) Baucom, Valma L. ([id number]) -------------------------------------------------------------------------------- Pain Assessment Details Patient Name: Fritsche, Geanine L. Date of Service: 04/01/2021 10:15 AM Medical Record Number: [id number] Patient Account Number: [id number] Date of Birth/Sex: September 20, 1929 (87 y.o. F) Treating RN: Dolan Amen Primary Care Ukiah Trawick: Miguel Aschoff Other Clinician: Referring Awanda Wilcock: Miguel Aschoff Treating Evolet Salminen/Extender: Yaakov Guthrie in Treatment: 0 Active Problems Location of Pain Severity and Description of Pain Patient Has Paino No Site Locations Rate the pain. Current Pain Level: 0 Pain Management and Medication Current Pain Management: Electronic Signature(s) Signed: 04/01/2021 5:07:08 PM By: Dolan Amen RN Entered By: Dolan Amen on 04/01/2021 10:48:29 Luce, Nathanial Rancher ([id number]) -------------------------------------------------------------------------------- Patient/Caregiver Education Details Patient Name:  [name], Ariannah L. Date of Service: 04/01/2021 10:15 AM Medical Record Number: [id number] Patient Account Number: [id number] Date of Birth/Gender: [birth year] (87 y.o. F) Treating RN: Dolan Amen Primary Care Physician: Miguel Aschoff Other Clinician: Referring Physician: Miguel Aschoff Treating Physician/Extender: Yaakov Guthrie in Treatment: 0 Education Assessment Education Provided To: Patient Education Topics Provided Welcome To [facility]: Methods: Explain/Verbal Responses: State content correctly Wound/Skin Impairment: Methods: Explain/Verbal Responses: State content correctly Electronic Signature(s) Signed: 04/01/2021 5:07:08 PM By: Dolan Amen RN Entered By: Dolan Amen on 04/01/2021 11:57:26 Chicas, Dorothee L. ([id number]) -------------------------------------------------------------------------------- Wound Assessment Details Patient Name: Falls, Cece L. Date of Service: 04/01/2021 10:15 AM Medical Record Number: [id number] Patient Account Number: [id number] Date of Birth/Sex: 11-22-1928 (87 y.o. F) Treating RN: Dolan Amen Primary Care Rahshawn Remo: Miguel Aschoff Other Clinician: Referring Yamili Lichtenwalner: Miguel Aschoff Treating Rajan Burgard/Extender: Yaakov Guthrie in Treatment: 0 Wound Status Wound Number: 1 Primary Open Surgical Wound Etiology: Wound Location: Left, Lateral Malleolus Wound Open Wounding Event: Surgical Injury Status: Date Acquired: 02/08/2021 Comorbid Chronic Obstructive Pulmonary Disease (COPD), Weeks Of Treatment: 0 History: Arrhythmia, Coronary Artery Disease, Hypertension, Type Clustered Wound: No II Diabetes, Osteoarthritis, Neuropathy Photos Wound Measurements Length: (cm) 1.2 Width: (cm) 1 Depth: (cm) 0.8 Area: (cm) 0.942 Volume: (cm) 0.754 % Reduction in Area: 0% % Reduction in Volume: 0% Epithelialization: None Tunneling: No Undermining: No Wound Description Classification:  Full Thickness Without Exposed Support Structu Exudate Amount: Medium Exudate Type: Serosanguineous Exudate Color: red, brown res Foul  Odor After Cleansing: No Slough/Fibrino Yes Wound Bed Granulation Amount: None Present (0%) Exposed Structure Necrotic Amount: Large (67-100%) Fascia Exposed: No Necrotic Quality: Adherent Slough Fat Layer (Subcutaneous Tissue) Exposed: Yes Tendon Exposed: No Muscle Exposed: No Joint Exposed: No Bone Exposed: No Electronic Signature(s) Signed: 04/01/2021 5:07:08 PM By: Dolan Amen RN Entered By: Dolan Amen on 04/01/2021 11:09:43 Knoche, Dorreen L. ([id number]) -------------------------------------------------------------------------------- Wound Assessment Details Patient Name: Cuevas, Ednah L. Date of Service: 04/01/2021 10:15 AM Medical Record Number: [id number] Patient Account Number: [id number] Date of Birth/Sex: 06-Aug-1929 (87 y.o. F) Treating RN: Dolan Amen Primary Care Garlene Apperson: Miguel Aschoff Other Clinician: Referring Iness Pangilinan: Miguel Aschoff Treating Rayburn Mundis/Extender: Yaakov Guthrie in Treatment: 0 Wound Status Wound Number: 2 Primary Trauma, Other Etiology: Wound Location: Left Ankle Wound Open Wounding Event: Skin Tear/Laceration Status: Date Acquired: 02/08/2021 Comorbid Chronic Obstructive Pulmonary Disease (COPD), Weeks Of Treatment: 0 History: Arrhythmia, Coronary Artery Disease, Hypertension, Type Clustered Wound: No II Diabetes, Osteoarthritis, Neuropathy Photos Wound Measurements Length: (cm) 1.2 Width: (cm) 0.8 Depth: (cm) 0.1 Area: (cm) 0.754 Volume: (cm) 0.075 % Reduction in Area: 0% % Reduction in Volume: 0% Epithelialization: None Tunneling: No Undermining: No Wound Description Classification: Full Thickness Without Exposed Support Structu Exudate Amount: Medium Exudate Type: Sanguinous Exudate Color: red res Foul Odor After Cleansing: No Slough/Fibrino No Wound  Bed Granulation Amount: Large (67-100%) Exposed Structure Granulation Quality: Red Fascia Exposed: No Necrotic Amount: None Present (0%) Fat Layer (Subcutaneous Tissue) Exposed: Yes Tendon Exposed: No Muscle Exposed: No Joint Exposed: No Bone Exposed: No Electronic Signature(s) Signed: 04/01/2021 5:07:08 PM By: Dolan Amen RN Entered By: Dolan Amen on 04/01/2021 11:10:20 Goll, Lupita L. ([id number]) -------------------------------------------------------------------------------- Vitals Details Patient Name: [name], Floriene L. Date of Service: 04/01/2021 10:15 AM Medical Record Number: [id number] Patient Account Number: [id number] Date of Birth/Sex: [birth year] (87 y.o. F) Treating RN: Dolan Amen Primary Care Jalexis Breed: Miguel Aschoff Other Clinician: Referring Innocence Schlotzhauer: Miguel Aschoff Treating Gregorey Nabor/Extender: Yaakov Guthrie in Treatment: 0 Vital Signs Time Taken: 10:48 Temperature (F): 98.3 Height (in): 62 Pulse (bpm): 99 Source: Stated Respiratory Rate (breaths/min): 18 Blood Pressure (mmHg): 157/77 Reference Range: 80 - 120 mg / dl Electronic Signature(s) Signed: 04/01/2021 5:07:08 PM By: Dolan Amen RN Entered By: Dolan Amen on 04/01/2021 10:49:15

## 2021-04-01 NOTE — Telephone Encounter (Signed)
   Notes to clinic:  Patient appointment on 06/23/2021 Review for enough until appt    Requested Prescriptions  Pending Prescriptions Disp Refills   atorvastatin (LIPITOR) 10 MG tablet [Pharmacy Med Name: ATORVASTATIN CALCIUM 10 MG TAB] 30 tablet 11    Sig: TAKE 1 TABLET BY MOUTH ONCE DAILY.      Cardiovascular:  Antilipid - Statins Failed - 04/01/2021 11:54 AM      Failed - Total Cholesterol in normal range and within 360 days    Cholesterol, Total  Date Value Ref Range Status  05/26/2016 237 (H) 100 - 199 mg/dL Final   Cholesterol  Date Value Ref Range Status  02/12/2020 128 0 - 200 Final          Failed - LDL in normal range and within 360 days    LDL Cholesterol (Calc)  Date Value Ref Range Status  09/13/2017 104 (H) mg/dL (calc) Final    Comment:    Reference range: <100 . Desirable range <100 mg/dL for primary prevention;   <70 mg/dL for patients with CHD or diabetic patients  with > or = 2 CHD risk factors. Marland Kitchen LDL-C is now calculated using the Martin-Hopkins  calculation, which is a validated novel method providing  better accuracy than the Friedewald equation in the  estimation of LDL-C.  Cresenciano Genre et al. Annamaria Helling. 7619;509(32): 2061-2068  (http://education.[facility].com/faq/FAQ164)    LDL Cholesterol  Date Value Ref Range Status  02/12/2020 49  Final          Failed - HDL in normal range and within 360 days    HDL  Date Value Ref Range Status  02/12/2020 62 35 - 70 Final  05/26/2016 54 >39 mg/dL Final          Failed - Triglycerides in normal range and within 360 days    Triglycerides  Date Value Ref Range Status  02/12/2020 85 40 - 160 Final          Passed - Patient is not pregnant      Passed - Valid encounter within last 12 months    Recent Outpatient Visits           1 month ago Laceration without foreign body, left ankle, subsequent encounter   [facility], PA-C   2 months ago Benign essential HTN    [facility] Jerrol Banana., MD   4 months ago Anemia, unspecified type   [facility] Jerrol Banana., MD   5 months ago Left shoulder pain, unspecified chronicity   [place], PA-C   8 months ago Pain in joint of left hand   [facility], Vickki Muff, [place]

## 2021-04-01 NOTE — Progress Notes (Signed)
CYRILLA, DURKIN ([id number]) Visit Report for 04/01/2021 Abuse/Suicide Risk Screen Details Patient Name: [name], [name] Date of Service: 04/01/2021 10:15 AM Medical Record Number: [id number] Patient Account Number: [id number] Date of Birth/Sex: 04-22-1929 (87 y.o. F) Treating RN: Dolan Amen Primary Care Tenzin Pavon: Miguel Aschoff Other Clinician: Referring Zania Kalisz: Miguel Aschoff Treating Achaia Garlock/Extender: Yaakov Guthrie in Treatment: 0 Abuse/Suicide Risk Screen Items Answer ABUSE RISK SCREEN: Has anyone close to you tried to hurt or harm you recentlyo No Do you feel uncomfortable with anyone in your familyo No Has anyone forced you do things that you didnot want to doo No Electronic Signature(s) Signed: 04/01/2021 5:07:08 PM By: Dolan Amen RN Entered By: Dolan Amen on 04/01/2021 10:56:14 Natzke, Kale L. ([id number]) -------------------------------------------------------------------------------- Activities of Daily Living Details Patient Name: [name], [name] Date of Service: 04/01/2021 10:15 AM Medical Record Number: [id number] Patient Account Number: [id number] Date of Birth/Sex: 1928/10/04 (87 y.o. F) Treating RN: Dolan Amen Primary Care Tip Atienza: Miguel Aschoff Other Clinician: Referring Marvelle Caudill: Miguel Aschoff Treating Eboney Claybrook/Extender: Yaakov Guthrie in Treatment: 0 Activities of Daily Living Items Answer Activities of Daily Living (Please select one for each item) Drive Automobile Completely Able Take Medications Completely Able Use Telephone Completely Able Care for Appearance Completely Able Use Toilet Completely Able Bath / Shower Completely Able Dress Self Completely Able Feed Self Completely Able Walk Completely Able Get In / Out Bed Completely Able Housework Completely Able Prepare Meals Completely Able Handle Money Completely Able Shop for Self Completely Able Electronic Signature(s) Signed:  04/01/2021 5:07:08 PM By: Dolan Amen RN Entered By: Dolan Amen on 04/01/2021 10:56:31 Godbey, Nathanial Rancher ([id number]) -------------------------------------------------------------------------------- Education Screening Details Patient Name: Tammy Neighbors, [name] Date of Service: 04/01/2021 10:15 AM Medical Record Number: [id number] Patient Account Number: [id number] Date of Birth/Sex: 02-Feb-1929 (87 y.o. F) Treating RN: Dolan Amen Primary Care Rebbecca Osuna: Miguel Aschoff Other Clinician: Referring Braden Cimo: Miguel Aschoff Treating Kairon Shock/Extender: Yaakov Guthrie in Treatment: 0 Primary Learner Assessed: Patient Learning Preferences/Education Level/Primary Language Learning Preference: Explanation, Demonstration Highest Education Level: Grade School Preferred Language: English Cognitive Barrier Language Barrier: No Translator Needed: No Memory Deficit: No Emotional Barrier: No Physical Barrier Impaired Vision: No Impaired Hearing: Yes Decreased Hand dexterity: No Knowledge/Comprehension Knowledge Level: Medium Comprehension Level: Medium Ability to understand written instructions: Medium Ability to understand verbal instructions: Medium Motivation Anxiety Level: Calm Cooperation: Cooperative Education Importance: Acknowledges Need Interest in Health Problems: Asks Questions Perception: Coherent Willingness to Engage in Self-Management Medium Activities: Readiness to Engage in Self-Management Medium Activities: Electronic Signature(s) Signed: 04/01/2021 5:07:08 PM By: Dolan Amen RN Entered By: Dolan Amen on 04/01/2021 10:56:58 [name] ([id number]) -------------------------------------------------------------------------------- Fall Risk Assessment Details Patient Name: Hall, [name] Date of Service: 04/01/2021 10:15 AM Medical Record Number: [id number] Patient Account Number: [id number] Date of Birth/Sex: 19-Jun-1929  (87 y.o. F) Treating RN: Dolan Amen Primary Care Ciara Kagan: Miguel Aschoff Other Clinician: Referring Raya Mckinstry: Miguel Aschoff Treating Jujuan Dugo/Extender: Yaakov Guthrie in Treatment: 0 Fall Risk Assessment Items Have you had 2 or more falls in the last 12 monthso 0 Yes Have you had any fall that resulted in injury in the last 12 monthso 0 Yes FALLS RISK SCREEN History of falling - immediate or within 3 months 25 Yes Secondary diagnosis (Do you have 2 or more medical diagnoseso) 15 Yes Ambulatory aid None/bed rest/wheelchair/nurse 0 No Crutches/cane/walker 15 Yes Furniture 0 No Intravenous therapy Access/Saline/Heparin Lock 0 No Gait/Transferring Normal/ bed rest/ wheelchair 0 No Weak (short steps with  or without shuffle, stooped but able to lift head while walking, may 10 Yes seek support from furniture) Impaired (short steps with shuffle, may have difficulty arising from chair, head down, impaired 0 No balance) Mental Status Oriented to own ability 0 Yes Electronic Signature(s) Signed: 04/01/2021 5:07:08 PM By: Dolan Amen RN Entered By: Dolan Amen on 04/01/2021 10:57:19 Mcgrail, Alverda L. ([id number]) -------------------------------------------------------------------------------- Foot Assessment Details Patient Name: Hall, [name] Date of Service: 04/01/2021 10:15 AM Medical Record Number: [id number] Patient Account Number: [id number] Date of Birth/Sex: 25-Feb-1929 (87 y.o. F) Treating RN: Dolan Amen Primary Care Vaughn Beaumier: Miguel Aschoff Other Clinician: Referring Elzada Pytel: Miguel Aschoff Treating Ajwa Kimberley/Extender: Yaakov Guthrie in Treatment: 0 Foot Assessment Items Site Locations + = Sensation present, - = Sensation absent, C = Callus, U = Ulcer R = Redness, W = Warmth, M = Maceration, PU = Pre-ulcerative lesion F = Fissure, S = Swelling, D = Dryness Assessment Right: Left: Other Deformity: No No Prior Foot Ulcer:  No No Prior Amputation: No No Charcot Joint: No No Ambulatory Status: Ambulatory With Help Assistance Device: Walker Gait: Steady Electronic Signature(s) Signed: 04/01/2021 5:07:08 PM By: Dolan Amen RN Entered By: Dolan Amen on 04/01/2021 11:05:12 Chery, Avalon L. ([id number]) -------------------------------------------------------------------------------- Nutrition Risk Screening Details Patient Name: Guthridge, Chanetta L. Date of Service: 04/01/2021 10:15 AM Medical Record Number: [id number] Patient Account Number: [id number] Date of Birth/Sex: September 23, 1929 (87 y.o. F) Treating RN: Dolan Amen Primary Care Lajuanda Penick: Miguel Aschoff Other Clinician: Referring Deaken Jurgens: Miguel Aschoff Treating Chasya Zenz/Extender: Yaakov Guthrie in Treatment: 0 Height (in): 62 Weight (lbs): Body Mass Index (BMI): Nutrition Risk Screening Items Score Screening NUTRITION RISK SCREEN: I have an illness or condition that made me change the kind and/or amount of food I eat 0 No I eat fewer than two meals per day 0 No I eat few fruits and vegetables, or milk products 0 No I have three or more drinks of beer, liquor or wine almost every day 0 No I have tooth or mouth problems that make it hard for me to eat 0 No I don't always have enough money to buy the food I need 0 No I eat alone most of the time 0 No I take three or more different prescribed or over-the-counter drugs a day 1 Yes Without wanting to, I have lost or gained 10 pounds in the last six months 0 No I am not always physically able to shop, cook and/or feed myself 0 No Nutrition Protocols Good Risk Protocol 0 No interventions needed Moderate Risk Protocol High Risk Proctocol Risk Level: Good Risk Score: 1 Electronic Signature(s) Signed: 04/01/2021 5:07:08 PM By: Dolan Amen RN Entered By: Dolan Amen on 04/01/2021 10:57:31

## 2021-04-03 ENCOUNTER — Emergency Department: Payer: Medicare Other

## 2021-04-03 ENCOUNTER — Other Ambulatory Visit: Payer: Self-pay

## 2021-04-03 ENCOUNTER — Emergency Department
Admission: EM | Admit: 2021-04-03 | Discharge: 2021-04-04 | Disposition: A | Discharge status: Home / Self Care | Payer: Medicare Other | Attending: Emergency Medicine | Admitting: Emergency Medicine

## 2021-04-03 ENCOUNTER — Telehealth (INDEPENDENT_AMBULATORY_CARE_PROVIDER_SITE_OTHER): Payer: Self-pay | Admitting: Vascular Surgery

## 2021-04-03 DIAGNOSIS — E7800 Pure hypercholesterolemia, unspecified: Secondary | ICD-10-CM | POA: Diagnosis not present

## 2021-04-03 DIAGNOSIS — J449 Chronic obstructive pulmonary disease, unspecified: Secondary | ICD-10-CM | POA: Insufficient documentation

## 2021-04-03 DIAGNOSIS — I480 Paroxysmal atrial fibrillation: Secondary | ICD-10-CM | POA: Diagnosis not present

## 2021-04-03 DIAGNOSIS — F0390 Unspecified dementia without behavioral disturbance: Secondary | ICD-10-CM | POA: Insufficient documentation

## 2021-04-03 DIAGNOSIS — Z7902 Long term (current) use of antithrombotics/antiplatelets: Secondary | ICD-10-CM | POA: Insufficient documentation

## 2021-04-03 DIAGNOSIS — Z794 Long term (current) use of insulin: Secondary | ICD-10-CM | POA: Insufficient documentation

## 2021-04-03 DIAGNOSIS — I129 Hypertensive chronic kidney disease with stage 1 through stage 4 chronic kidney disease, or unspecified chronic kidney disease: Secondary | ICD-10-CM | POA: Insufficient documentation

## 2021-04-03 DIAGNOSIS — Z8616 Personal history of COVID-19: Secondary | ICD-10-CM | POA: Diagnosis not present

## 2021-04-03 DIAGNOSIS — Z7951 Long term (current) use of inhaled steroids: Secondary | ICD-10-CM | POA: Insufficient documentation

## 2021-04-03 DIAGNOSIS — Z7982 Long term (current) use of aspirin: Secondary | ICD-10-CM | POA: Diagnosis not present

## 2021-04-03 DIAGNOSIS — Z79899 Other long term (current) drug therapy: Secondary | ICD-10-CM | POA: Insufficient documentation

## 2021-04-03 DIAGNOSIS — I10 Essential (primary) hypertension: Secondary | ICD-10-CM | POA: Diagnosis not present

## 2021-04-03 DIAGNOSIS — E1142 Type 2 diabetes mellitus with diabetic polyneuropathy: Secondary | ICD-10-CM | POA: Diagnosis not present

## 2021-04-03 DIAGNOSIS — N289 Disorder of kidney and ureter, unspecified: Secondary | ICD-10-CM | POA: Diagnosis not present

## 2021-04-03 DIAGNOSIS — E1151 Type 2 diabetes mellitus with diabetic peripheral angiopathy without gangrene: Secondary | ICD-10-CM | POA: Diagnosis not present

## 2021-04-03 DIAGNOSIS — I2510 Atherosclerotic heart disease of native coronary artery without angina pectoris: Secondary | ICD-10-CM | POA: Insufficient documentation

## 2021-04-03 DIAGNOSIS — M5136 Other intervertebral disc degeneration, lumbar region: Secondary | ICD-10-CM | POA: Diagnosis not present

## 2021-04-03 DIAGNOSIS — G4700 Insomnia, unspecified: Secondary | ICD-10-CM | POA: Diagnosis not present

## 2021-04-03 DIAGNOSIS — R1032 Left lower quadrant pain: Secondary | ICD-10-CM | POA: Diagnosis not present

## 2021-04-03 DIAGNOSIS — I4891 Unspecified atrial fibrillation: Secondary | ICD-10-CM | POA: Insufficient documentation

## 2021-04-03 DIAGNOSIS — Z85828 Personal history of other malignant neoplasm of skin: Secondary | ICD-10-CM | POA: Diagnosis not present

## 2021-04-03 DIAGNOSIS — F32A Depression, unspecified: Secondary | ICD-10-CM | POA: Diagnosis not present

## 2021-04-03 DIAGNOSIS — E1122 Type 2 diabetes mellitus with diabetic chronic kidney disease: Secondary | ICD-10-CM | POA: Diagnosis not present

## 2021-04-03 DIAGNOSIS — R1310 Dysphagia, unspecified: Secondary | ICD-10-CM | POA: Diagnosis not present

## 2021-04-03 DIAGNOSIS — N1831 Chronic kidney disease, stage 3a: Secondary | ICD-10-CM | POA: Insufficient documentation

## 2021-04-03 DIAGNOSIS — M4316 Spondylolisthesis, lumbar region: Secondary | ICD-10-CM | POA: Diagnosis not present

## 2021-04-03 DIAGNOSIS — Z91040 Latex allergy status: Secondary | ICD-10-CM | POA: Insufficient documentation

## 2021-04-03 DIAGNOSIS — K219 Gastro-esophageal reflux disease without esophagitis: Secondary | ICD-10-CM | POA: Diagnosis not present

## 2021-04-03 DIAGNOSIS — D631 Anemia in chronic kidney disease: Secondary | ICD-10-CM | POA: Diagnosis not present

## 2021-04-03 DIAGNOSIS — I700 Atherosclerosis of aorta: Secondary | ICD-10-CM | POA: Diagnosis not present

## 2021-04-03 DIAGNOSIS — S91012D Laceration without foreign body, left ankle, subsequent encounter: Secondary | ICD-10-CM | POA: Diagnosis not present

## 2021-04-03 DIAGNOSIS — U071 COVID-19: Secondary | ICD-10-CM | POA: Diagnosis not present

## 2021-04-03 LAB — TROPONIN I (HIGH SENSITIVITY)
Troponin I (High Sensitivity): 10 ng/L (ref <18)
Troponin I (High Sensitivity): 11 ng/L (ref <18)

## 2021-04-03 LAB — COMPREHENSIVE METABOLIC PANEL
ALT: 8 U/L (ref 0–44)
AST: 17 U/L (ref 15–41)
Albumin: 3.7 g/dL (ref 3.5–5.0)
Alkaline Phosphatase: 78 U/L (ref 38–126)
Anion gap: 6 (ref 5–15)
BUN: 13 mg/dL (ref 8–23)
CO2: 28 mmol/L (ref 22–32)
Calcium: 8.6 mg/dL — ABNORMAL LOW (ref 8.9–10.3)
Chloride: 100 mmol/L (ref 98–111)
Creatinine, Ser: 0.89 mg/dL (ref 0.44–1.00)
GFR, Estimated: >60 mL/min (ref >60)
Glucose, Bld: 94 mg/dL (ref 70–99)
Potassium: 3.3 mmol/L — ABNORMAL LOW (ref 3.5–5.1)
Sodium: 134 mmol/L — ABNORMAL LOW (ref 135–145)
Total Bilirubin: 0.6 mg/dL (ref 0.3–1.2)
Total Protein: 6.6 g/dL (ref 6.5–8.1)

## 2021-04-03 LAB — CBC
HCT: 29.1 % — ABNORMAL LOW (ref 36.0–46.0)
Hemoglobin: 9 g/dL — ABNORMAL LOW (ref 12.0–15.0)
MCH: 24.5 pg — ABNORMAL LOW (ref 26.0–34.0)
MCHC: 30.9 g/dL (ref 30.0–36.0)
MCV: 79.3 fL — ABNORMAL LOW (ref 80.0–100.0)
Platelets: 299 10*3/uL (ref 150–400)
RBC: 3.67 MIL/uL — ABNORMAL LOW (ref 3.87–5.11)
RDW: 16.5 % — ABNORMAL HIGH (ref 11.5–15.5)
WBC: 9.4 10*3/uL (ref 4.0–10.5)
nRBC: 0 % (ref 0.0–0.2)

## 2021-04-03 LAB — LIPASE, BLOOD: Lipase: 30 U/L (ref 11–51)

## 2021-04-03 MED ORDER — FENTANYL CITRATE (PF) 100 MCG/2ML IJ SOLN
50.0000 ug | Freq: Once | INTRAMUSCULAR | Status: AC
Start: 2021-04-03 — End: 2021-04-03
  Administered 2021-04-03: 50 ug via INTRAVENOUS
  Filled 2021-04-03: qty 2

## 2021-04-03 MED ORDER — IOHEXOL 300 MG/ML  SOLN
75.0000 mL | Freq: Once | INTRAMUSCULAR | Status: AC | PRN
  Administered 2021-04-03: 75 mL via INTRAVENOUS

## 2021-04-03 NOTE — ED Provider Notes (Signed)
[facility] Emergency Department Provider Note  ____________________________________________   Event Date/Time   First MD Initiated Contact with Patient 04/03/21 2153     (approximate)  I have reviewed the triage vital signs and the nursing notes.   HISTORY  Chief Complaint Abdominal Pain   HPI PAYSON CRUMBY is a 87 y.o. female with a past medical history of CAD, A. fib, COPD, DM, HTN, HDL, chronic back pain, COPD, vulvovaginitis and PAD status post stenting of femoral artery several years ago presents for assessment after 4 days of left lower quadrant abdominal pain.  Seem to get worse yesterday when she was stepping out of shower.  She does not recall any recent injuries or falls.  She states she has not had any associated constipation, diarrhea, urinary symptoms, back pain, chest pain, cough, shortness of breath, headache, earache, sore throat, rash or any other clear associated sick symptoms.  No other acute concerns at this time.  She does not drink alcohol regularly or use NSAIDs.         Past Medical History:  Diagnosis Date   Angina pectoris ([place])    Atrial fibrillation (HCC)    Basal cell carcinoma    nose   Cervicalgia    Chronic back pain    Chronic obstructive pulmonary disease (COPD) (HCC)    COPD (chronic obstructive pulmonary disease) (HCC)    DDD (degenerative disc disease), lumbar    Diabetes mellitus without complication ([place])    Dysphagia    Hyperlipemia    Hypertension    Squamous cell carcinoma of skin    left lateral pretibial   Vulvovaginitis     Patient Active Problem List   Diagnosis Date Noted   PSVT (paroxysmal supraventricular tachycardia) ([place]) 76/22/6333   Acute metabolic encephalopathy 54/56/2563   Frequent falls 02/10/2021   COVID-19 virus infection 02/10/2021   Chronic kidney disease, stage 3a ([place]) 02/10/2021   Dementia ([place]) 02/10/2021   Garbled speech, intermittent 02/10/2021   Laceration without  foreign body, left ankle, subsequent encounter 02/10/2021   AMS (altered mental status) 02/10/2021   Coronary artery disease involving native coronary artery of native heart 04/17/2020   Celiac artery stenosis ([place]) 06/12/2019   Bilateral lower abdominal cramping 04/20/2019   UTI symptoms 04/20/2019   History of rectal bleeding 03/26/2019   DM type 2 with diabetic peripheral neuropathy ([place]) 12/14/2018   Arthropathy of lumbar facet joint 09/04/2018   Degeneration of lumbar intervertebral disc 09/04/2018   Spondylolisthesis, grade 1 09/04/2018   Lewy body dementia ([place]) 02/17/2018   PVD (peripheral vascular disease) ([place]) 08/16/2017   Pain in limb 07/27/2016   Hallucinations 07/07/2016   Chest pain 07/01/2016   Depression 06/23/2016   Hypokalemia 09/03/2015   Insomnia 07/23/2015   Headache 07/23/2015   Type 2 diabetes mellitus with vascular disease ([place]) 06/24/2015   Chronic vulvitis 06/02/2015   Allergic rhinitis 05/30/2015   Anemia 05/30/2015   Back ache 05/30/2015   Body mass index (BMI) of 29.0-29.9 in adult 05/30/2015   Edema 05/30/2015   Dilatation of esophagus 05/30/2015   Fall 05/30/2015   H/O deep venous thrombosis 05/30/2015   Hypercholesteremia 05/30/2015   Heart & renal disease, hypertensive, with heart failure ([place]) 05/30/2015   Hyponatremia 05/30/2015   Calculus of kidney 05/30/2015   Gonalgia 05/30/2015   Psoriasis 05/30/2015   Avitaminosis D 05/30/2015   Cough 04/01/2015   Combined fat and carbohydrate induced hyperlipemia 12/30/2014   Paroxysmal atrial fibrillation ([place]) 07/10/2014  Abnormal gait 07/08/2014   Anxiety state 07/08/2014   Chronic kidney disease 07/08/2014   Acid reflux 07/08/2014   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[place]) 07/08/2014   Chronic obstructive pulmonary disease ([place]) 07/08/2014   Type II diabetes mellitus with renal manifestations ([place]) 07/08/2014   Benign essential HTN 06/26/2014   Carotid artery narrowing 06/26/2014   Myalgia  06/26/2014   Basal cell carcinoma of face 05/01/2014   Disease of female genital organs 11/28/2012   Incomplete bladder emptying 09/05/2012   Excessive urination at night 08/15/2012   Basal cell carcinoma of ear 10/26/2011    Past Surgical History:  Procedure Laterality Date   gall bladder     melanoma     removal neck and back   PERIPHERAL VASCULAR CATHETERIZATION Left 01/05/2016   Procedure: Lower Extremity Angiography;  Surgeon: Algernon Huxley, MD;  Location: [place] [facility];  Service: Cardiovascular;  Laterality: Left;   PERIPHERAL VASCULAR CATHETERIZATION  01/05/2016   Procedure: Lower Extremity Intervention;  Surgeon: Algernon Huxley, MD;  Location: [place] [facility];  Service: Cardiovascular;;   ureterolithiasis     calculus removed   VAGINAL HYSTERECTOMY     VISCERAL ANGIOGRAPHY N/A 05/10/2019   Procedure: VISCERAL ANGIOGRAPHY;  Surgeon: Algernon Huxley, MD;  Location: [place] [facility];  Service: Cardiovascular;  Laterality: N/A;   VULVA / PERINEUM BIOPSY  05/29/2015    Prior to Admission medications   Medication Sig Start Date End Date Taking? Authorizing Provider  [place] 413-24 MCG/ACT inhaler TAKE 2 PUFFS INTO LUNGS TWICE A DAY Patient taking differently: Inhale 2 puffs into the lungs 2 (two) times daily. 02/04/21   Flora Lipps, MD  albuterol (VENTOLIN HFA) 108 (90 Base) MCG/ACT inhaler INHALE 2 PUFFS INTO THE LUNGS EVERY 4 HOURS AS NEEDED FOR WHEEZING ORSHORTNESS OF BREATH Patient taking differently: Inhale 2 puffs into the lungs every 4 (four) hours as needed for wheezing or shortness of breath. INHALE 2 PUFFS INTO THE LUNGS EVERY 4 HOURS AS NEEDED FOR WHEEZING ORSHORTNESS OF BREATH 07/04/19   Flora Lipps, MD  aspirin 325 MG tablet Take 325 mg by mouth daily. Reported on 01/05/2016    [provider]  atorvastatin (LIPITOR) 10 MG tablet Take 1 tablet (10 mg total) by mouth daily. 04/02/21   Jerrol Banana., MD  cetirizine (ZYRTEC) 10 MG tablet TAKE ONE  TABLET BY MOUTH EVERY DAY Patient taking differently: Take 10 mg by mouth daily. 12/12/18   Jerrol Banana., MD  cholecalciferol (VITAMIN D3) 25 MCG (1000 UT) tablet Take 1,000 Units by mouth daily.    [provider]  clopidogrel (PLAVIX) 75 MG tablet TAKE 1 TABLET BY MOUTH DAILY 04/01/21   Jerrol Banana., MD  diltiazem (TIADYLT ER) 360 MG 24 hr capsule Hold until followup with your outpatient doctor due to your blood pressure on the low side. 02/10/21   Enzo Bi, MD  ezetimibe (ZETIA) 10 MG tablet TAKE 1 TABLET BY MOUTH DAILY Patient taking differently: Take 10 mg by mouth daily. 04/29/20   Jerrol Banana., MD  FLUoxetine (PROZAC) 20 MG capsule TAKE 1 CAPSULE EVERY DAY Patient taking differently: Take 20 mg by mouth daily. 01/02/21   Flinchum, Kelby Aline, FNP  glucose blood test strip ACCU-CHEK AVIVA PLUS TEST STRP Use to check sugars 3 times daily. 10/28/15   [provider]  insulin glargine (LANTUS) 100 UNIT/ML injection Inject 0.05 mLs (5 Units total) into the skin at bedtime. 07/02/16  Epifanio Lesches, MD  insulin lispro (HUMALOG) 100 UNIT/ML KiwkPen Inject 10 Units into the skin 3 (three) times daily. 10 units before breakfast, 8 before lunch and 18 before supper    [provider]  iron polysaccharides (NIFEREX) 150 MG capsule Take 1 capsule (150 mg total) by mouth daily. 02/13/21 03/15/21  Sharen Hones, MD  isosorbide mononitrate (IMDUR) 30 MG 24 hr tablet Take 30 mg by mouth daily. 06/12/20   [provider]  lansoprazole (PREVACID) 30 MG capsule TAKE 1 CAPSULE BY MOUTH ONCE DAILY AT NOON Patient taking differently: Take 30 mg by mouth daily at 12 noon. TAKE 1 CAPSULE BY MOUTH ONCE DAILY AT NOON 12/03/20   Jerrol Banana., MD  losartan (COZAAR) 100 MG tablet Take 0.5 tablets (50 mg total) by mouth daily. This is a decrease from 100 mg daily. 02/10/21   Enzo Bi, MD  Magnesium 400 MG CAPS Take by mouth daily.    [provider]  magnesium oxide (MAG-OX) 400 MG tablet Take 400 mg by mouth daily.    [provider]  meloxicam (MOBIC) 7.5 MG tablet Take 1 tablet (7.5 mg total) by mouth daily as needed for pain. 03/18/20   Jerrol Banana., MD  memantine (NAMENDA) 5 MG tablet TAKE ONE TABLET TWICE DAILY Patient taking differently: Take 5 mg by mouth 2 (two) times daily. 12/03/20   Jerrol Banana., MD  montelukast (SINGULAIR) 10 MG tablet TAKE ONE TABLET AT BEDTIME Patient taking differently: Take 10 mg by mouth at bedtime. 12/03/20   Jerrol Banana., MD  ondansetron (ZOFRAN-ODT) 4 MG disintegrating tablet Take 4 mg by mouth every 8 (eight) hours as needed for nausea. For up to 12 doses. 02/06/21   [provider]  pantoprazole (PROTONIX) 40 MG tablet Take 40 mg by mouth daily. Patient not taking: No sig reported    [provider]  QUEtiapine (SEROQUEL) 25 MG tablet Take 1 tablet by mouth at bedtime. 03/28/20   [provider]  traZODone (DESYREL) 50 MG tablet TAKE 1 AND 1/2 TABLET AT BEDTIME AS NEEDED FOR SLEEP Patient taking differently: Take 75 mg by mouth at bedtime as needed for sleep. 10/14/20   Jerrol Banana., MD  TUSSIN DM 100-10 MG/5ML liquid TAKE 1 TEASPOONFUL BY MOUTH EVERY 4 HOURS AS NEEDED FOR COUGH 08/20/19   Flora Lipps, MD  vitamin B-12 1000 MCG tablet Take 1 tablet (1,000 mcg total) by mouth daily. 02/13/21   Sharen Hones, MD  vitamin E 1000 UNIT capsule Take 1,000 Units by mouth daily.    [provider]    Allergies Bacitracin-neomycin-polymyxin, Neomycin-bacitracin zn-polymyx, Latex, Lidocaine, Aricept [donepezil hcl], Benzalkonium chloride, Ibuprofen, Valdecoxib, Albuterol, Tape, and Triamcinolone  Family History  Problem Relation Age of Onset   Ovarian cancer Other    Diabetes Sister    Colon cancer Brother    Diabetes Brother    Atrial fibrillation Daughter    Breast cancer Neg Hx     Social History Social History    Tobacco Use   Smoking status: Never   Smokeless tobacco: Never  Vaping Use   Vaping Use: Never used  Substance Use Topics   Alcohol use: No   Drug use: No    Review of Systems  Review of Systems  Constitutional:  Negative for chills and fever.  HENT:  Negative for sore throat.   Eyes:  Negative for pain.  Respiratory:  Negative for cough and stridor.  Cardiovascular:  Negative for chest pain.  Gastrointestinal:  Positive for abdominal pain. Negative for vomiting.  Genitourinary:  Negative for dysuria.  Skin:  Negative for rash.  Neurological:  Negative for seizures, loss of consciousness and headaches.  Psychiatric/Behavioral:  Negative for suicidal ideas.   All other systems reviewed and are negative.    ____________________________________________   PHYSICAL EXAM:  VITAL SIGNS: ED Triage Vitals [04/03/21 1720]  Enc Vitals Group     BP (!) 171/76     Pulse Rate 96     Resp 18     Temp 98.5 F (36.9 C)     Temp Source Oral     SpO2 98 %     Weight      Height 5\' 2"  (1.575 m)     Head Circumference      Peak Flow      Pain Score 3     Pain Loc      Pain Edu?      Excl. in [place]?    Vitals:   04/03/21 2250 04/03/21 2300  BP: (!) 196/72 (!) 207/91  Pulse: 90 93  Resp: 18 18  Temp:    SpO2: 98% 97%   Physical Exam Vitals and nursing note reviewed.  Constitutional:      General: She is not in acute distress.    Appearance: She is well-developed.  HENT:     Head: Normocephalic and atraumatic.     Right Ear: External ear normal.     Left Ear: External ear normal.     Nose: Nose normal.     Mouth/Throat:     Mouth: Mucous membranes are moist.  Eyes:     Conjunctiva/sclera: Conjunctivae normal.  Cardiovascular:     Rate and Rhythm: Normal rate and regular rhythm.     Heart sounds: No murmur heard. Pulmonary:     Effort: Pulmonary effort is normal. No respiratory distress.     Breath sounds: Normal breath sounds.  Abdominal:     Palpations:  Abdomen is soft.     Tenderness: There is abdominal tenderness. There is no right CVA tenderness or left CVA tenderness.  Musculoskeletal:     Cervical back: Neck supple.     Right lower leg: No edema.     Left lower leg: No edema.  Skin:    General: Skin is warm and dry.     Capillary Refill: Capillary refill takes less than 2 seconds.  Neurological:     Mental Status: She is alert and oriented to person, place, and time.  Psychiatric:        Mood and Affect: Mood normal.     ____________________________________________   LABS (all labs ordered are listed, but only abnormal results are displayed)  Labs Reviewed  COMPREHENSIVE METABOLIC PANEL - Abnormal; Notable for the following components:      Result Value   Sodium 134 (*)    Potassium 3.3 (*)    Calcium 8.6 (*)    All other components within normal limits  CBC - Abnormal; Notable for the following components:   RBC 3.67 (*)    Hemoglobin 9.0 (*)    HCT 29.1 (*)    MCV 79.3 (*)    MCH 24.5 (*)    RDW 16.5 (*)    All other components within normal limits  LIPASE, BLOOD  URINALYSIS, COMPLETE (UACMP) WITH MICROSCOPIC  TROPONIN I (HIGH SENSITIVITY)  TROPONIN I (HIGH SENSITIVITY)   ____________________________________________  EKG  Sinus rhythm  with a ventricular rate of 90, normal axis, unremarkable intervals without evidence of acute ischemia or significant arrhythmia. ____________________________________________  RADIOLOGY  ED MD interpretation:    Official radiology report(s): No results found.  ____________________________________________   PROCEDURES  Procedure(s) performed (including Critical Care):  Procedures   ____________________________________________   INITIAL IMPRESSION / ASSESSMENT AND PLAN / ED COURSE      Patient presents with above-stated history exam accompanied by daughter for assessment of some left lower quadrant abdominal pain this is bothering him for the last couple of  days.  Seems she did decrease a pop in the shower showed some discomfort for this.  On arrival she is hypertensive with stable vital signs on room air.  She some mild tenderness in left lower quadrant but is not peritonitis or guarding and has no CVA tenderness.   Differential includes diverticulitis, malignancy, SBO, kidney stone, internal hernia, fibroid possible MSK.  Lipase not consistent with acute pancreatitis.  CMP shows K of 3.3 but no other significant electrolyte or metabolic derangements.  No evidence of hepatitis or cholestasis.  CBC shows no leukocytosis and hemoglobin at baseline.  Troponin x2 are nonelevated and given otherwise reassuring EKG without history of chest pain Evalose patient for atypical presentation for ACS.  Care patient signed over to oncoming provider at approximately 2300.  Follow-up CT UA and reassess.     ____________________________________________   FINAL CLINICAL IMPRESSION(S) / ED DIAGNOSES  Final diagnoses:  Left lower quadrant abdominal pain    Medications  fentaNYL (SUBLIMAZE) injection 50 mcg (50 mcg Intravenous Given 04/03/21 2252)  iohexol (OMNIPAQUE) 300 MG/ML solution 75 mL (75 mLs Intravenous Contrast Given 04/03/21 2305)     ED Discharge Orders     None        Note:  This document was prepared using Dragon voice recognition software and may include unintentional dictation errors.    Lucrezia Starch, MD 04/03/21 2322

## 2021-04-03 NOTE — ED Notes (Signed)
Patient transported to CT 

## 2021-04-03 NOTE — Telephone Encounter (Signed)
Called stating that patients' left thigh going down her leg is very painful. States that she has been in pain for about 5 days. Patient went to wound center for debridement on wednesday 04/01/21. Wound center states that could not feel pulse by hand but could hear it. Wound center to send over an order to have patients' circulation in her   Had verbal conversation with FB and she stated to bring her in with abi studies and to fit the carotid in as schedule allowed.  Patient is scheduled for 04/07/21 with abi and carotid studies.

## 2021-04-03 NOTE — ED Triage Notes (Signed)
Pt to ER with daughter. Reports stepping out of the shower yesterday and feeling something "pop" in her left lower quadrant. Has been in severe pain since. Reports she had a stent placed by Dr Lucky Cowboy 5 years ago and believes it might be related.  Pt with guaze dressing present to left heel, being followed by the wound care clinic.

## 2021-04-03 NOTE — ED Notes (Signed)
Pt ambulatory to RR for UA specimen.

## 2021-04-04 LAB — URINALYSIS, COMPLETE (UACMP) WITH MICROSCOPIC
Bacteria, UA: NONE SEEN
Bilirubin Urine: NEGATIVE
Glucose, UA: NEGATIVE mg/dL
Hgb urine dipstick: NEGATIVE
Ketones, ur: NEGATIVE mg/dL
Nitrite: NEGATIVE
Protein, ur: 30 mg/dL — AB
Specific Gravity, Urine: 1.015 (ref 1.005–1.030)
pH: 7 (ref 5.0–8.0)

## 2021-04-04 NOTE — ED Provider Notes (Signed)
1:20 AM Assumed care for off going team.   Blood pressure (!) 177/89, pulse 82, temperature 98.5 F (36.9 C), temperature source Oral, resp. rate 20, height 5\' 2"  (1.575 m), SpO2 97 %.  See their HPI for full report but in brief pending UA and CT scan   IMPRESSION:  1. No CT evidence for acute intra-abdominal or pelvic abnormality.  2. 8 mm slightly dense cortical lesion off the mid right kidney too  small to further characterize but is increased compared to prior.       CT imaging is reassuring.  Patient reports some continued pain in the left lower quadrant but there is no rebound, no guarding she is very well-appearing with normal vital signs except for slightly hypertensive.  Her urine is without evidence of UTI.  I did discuss the incidental finding of a small lesion on her kidney with family.  At this time patient seems her pain is well controlled and family felt comfortable with being discharged home.  Family member thinks that she might of pulled a muscle.  However we did discuss return precautions in regards to her abdominal pain including fevers, worsening pain, vomiting       Vanessa , MD 04/04/21 [phone]

## 2021-04-04 NOTE — Discharge Instructions (Addendum)
Continue taking Tylenol 1 g every 8 hours to help with the pain.  Return to the ER for worsening pain, vomiting, fevers or any other concerns  IMPRESSION:  1. No CT evidence for acute intra-abdominal or pelvic abnormality.  2. 8 mm slightly dense cortical lesion off the mid right kidney too  small to further characterize but is increased compared to prior.

## 2021-04-06 ENCOUNTER — Ambulatory Visit (INDEPENDENT_AMBULATORY_CARE_PROVIDER_SITE_OTHER): Payer: Medicare Other

## 2021-04-06 DIAGNOSIS — Z9181 History of falling: Secondary | ICD-10-CM

## 2021-04-06 DIAGNOSIS — E1159 Type 2 diabetes mellitus with other circulatory complications: Secondary | ICD-10-CM | POA: Diagnosis not present

## 2021-04-06 DIAGNOSIS — L089 Local infection of the skin and subcutaneous tissue, unspecified: Secondary | ICD-10-CM

## 2021-04-06 NOTE — Chronic Care Management (AMB) (Signed)
Chronic Care Management   Follow Up Note   04/06/2021 Name: [name] MRN: [id number] DOB: 10-May-1929  Primary Care Provider: Jerrol Banana., MD Reason for referral : Chronic Care Management   [name] is a 87 y.o. year old female who is a primary care patient of Tammy Banana., MD. A telephonic outreach was conducted today with [name].  Review of [name]'s status, including review of consultants reports, relevant laboratory and other test results, and collaboration with appropriate care team members and the patient's provider was performed as part of comprehensive patient evaluation and provision of chronic care management services.    SDOH (Social Determinants of Health) assessments performed: No     Outpatient Encounter Medications as of 04/06/2021  Medication Sig   ADVAIR HFA 230-21 MCG/ACT inhaler TAKE 2 PUFFS INTO LUNGS TWICE A DAY (Patient taking differently: Inhale 2 puffs into the lungs 2 (two) times daily.)   albuterol (VENTOLIN HFA) 108 (90 Base) MCG/ACT inhaler INHALE 2 PUFFS INTO THE LUNGS EVERY 4 HOURS AS NEEDED FOR WHEEZING ORSHORTNESS OF BREATH (Patient taking differently: Inhale 2 puffs into the lungs every 4 (four) hours as needed for wheezing or shortness of breath. INHALE 2 PUFFS INTO THE LUNGS EVERY 4 HOURS AS NEEDED FOR WHEEZING ORSHORTNESS OF BREATH)   aspirin 325 MG tablet Take 325 mg by mouth daily. Reported on 01/05/2016   atorvastatin (LIPITOR) 10 MG tablet Take 1 tablet (10 mg total) by mouth daily.   cetirizine (ZYRTEC) 10 MG tablet TAKE ONE TABLET BY MOUTH EVERY DAY (Patient taking differently: Take 10 mg by mouth daily.)   cholecalciferol (VITAMIN D3) 25 MCG (1000 UT) tablet Take 1,000 Units by mouth daily.   clopidogrel (PLAVIX) 75 MG tablet TAKE 1 TABLET BY MOUTH DAILY   diltiazem (TIADYLT ER) 360 MG 24 hr capsule Hold until followup with your outpatient doctor due to your blood pressure on  the low side.   ezetimibe (ZETIA) 10 MG tablet TAKE 1 TABLET BY MOUTH DAILY (Patient taking differently: Take 10 mg by mouth daily.)   FLUoxetine (PROZAC) 20 MG capsule TAKE 1 CAPSULE EVERY DAY (Patient taking differently: Take 20 mg by mouth daily.)   glucose blood test strip ACCU-CHEK AVIVA PLUS TEST STRP Use to check sugars 3 times daily.   insulin glargine (LANTUS) 100 UNIT/ML injection Inject 0.05 mLs (5 Units total) into the skin at bedtime.   insulin lispro (HUMALOG) 100 UNIT/ML KiwkPen Inject 10 Units into the skin 3 (three) times daily. 10 units before breakfast, 8 before lunch and 18 before supper   iron polysaccharides (NIFEREX) 150 MG capsule Take 1 capsule (150 mg total) by mouth daily.   isosorbide mononitrate (IMDUR) 30 MG 24 hr tablet Take 30 mg by mouth daily.   lansoprazole (PREVACID) 30 MG capsule TAKE 1 CAPSULE BY MOUTH ONCE DAILY AT NOON (Patient taking differently: Take 30 mg by mouth daily at 12 noon. TAKE 1 CAPSULE BY MOUTH ONCE DAILY AT NOON)   losartan (COZAAR) 100 MG tablet Take 0.5 tablets (50 mg total) by mouth daily. This is a decrease from 100 mg daily.   Magnesium 400 MG CAPS Take by mouth daily.   magnesium oxide (MAG-OX) 400 MG tablet Take 400 mg by mouth daily.   meloxicam (MOBIC) 7.5 MG tablet Take 1 tablet (7.5 mg total) by mouth daily as needed for pain.   memantine (NAMENDA) 5 MG tablet TAKE ONE TABLET TWICE DAILY (Patient taking differently: Take  5 mg by mouth 2 (two) times daily.)   montelukast (SINGULAIR) 10 MG tablet TAKE ONE TABLET AT BEDTIME (Patient taking differently: Take 10 mg by mouth at bedtime.)   ondansetron (ZOFRAN-ODT) 4 MG disintegrating tablet Take 4 mg by mouth every 8 (eight) hours as needed for nausea. For up to 12 doses.   pantoprazole (PROTONIX) 40 MG tablet Take 40 mg by mouth daily. (Patient not taking: No sig reported)   QUEtiapine (SEROQUEL) 25 MG tablet Take 1 tablet by mouth at bedtime.   traZODone (DESYREL) 50 MG tablet TAKE 1 AND  1/2 TABLET AT BEDTIME AS NEEDED FOR SLEEP (Patient taking differently: Take 75 mg by mouth at bedtime as needed for sleep.)   TUSSIN DM 100-10 MG/5ML liquid TAKE 1 TEASPOONFUL BY MOUTH EVERY 4 HOURS AS NEEDED FOR COUGH   vitamin B-12 1000 MCG tablet Take 1 tablet (1,000 mcg total) by mouth daily.   vitamin E 1000 UNIT capsule Take 1,000 Units by mouth daily.   No facility-administered encounter medications on file as of 04/06/2021.     Objective:  Patient Care Plan: Diabetes Type 2 (Adult)     Problem Identified: Disease Progression (Diabetes, Type 2)      Long-Range Goal: Disease Progression Prevented or Minimized   Start Date: 04/06/2021  Expected End Date: 07/05/2021  Priority: High  Note:   Lab Results  Component Value Date   HGBA1C 7.6 (H) 02/08/2021    Current Barriers:  Chronic disease management support and educational needs related to Diabetes self-management   Case Manager Clinical Goal(s):  Over the next 90 days, patient will demonstrate improved adherence to prescribed treatment plan for Diabetes self-management as evidenced by taking medications as prescribed, adhering to recommended ADA/carb modified diet and utilizing Dexcom/taking appropriate interventions to correct blood glucose levels when the device alarms.   Interventions:  Collaboration with Tammy Banana., MD regarding development and update of comprehensive plan of care as evidenced by provider attestation and co-signature Inter-disciplinary care team collaboration (see longitudinal plan of care) Reviewed medications and compliance with current treatment plan. Reviewed recent readings. Reviewed s/sx of hypoglycemia nd hyperglycemia. Per daughter [name], [name] continues taking medications as prescribed. Family members continue to prepare medications weekly. Reports most of her blood glucose readings have been within range with exception of a low reading of 54 mg/dl during her recent visit to the  ED. Reading today was 123 mg/dl. No concerns regarding nutritional intake. A1C is currently not at goal but [name] remains mostly independent with diabetes self-management. Tammy reports that she continues to respond to [facility] alarms appropriately. Her family remains very supportive and engaged with her care. Denies need for additional assistance with diabetes management.     Patient Goals/Self-Care Activities -Self-administer medications as prescribed -Attend all scheduled provider appointments -Continue utilizing Dexcom and take appropriate interventions when the device alarms -Adhere to prescribed ADA/carb modified -Notify provider or care management team with questions and new concerns as needed   Follow Up Plan:  Will follow up next month         Patient Care Plan: Fall Risk (Adult)     Problem Identified: Fall Risk      Long-Range Goal: Maintain Function /Absence of Fall and Fall-Related Injury   Start Date: 04/06/2021  Expected End Date: 07/05/2021  Priority: High  Note:   Current Barriers:  Fall Risk r/t Impaired Vision and Impaired Gait  Clinical Goal(s):  Over the next 120 days, patient will not experience  falls or require emergent care d/t fall related injuries.  Interventions:  Collaboration with Tammy Banana., MD regarding development and update of comprehensive plan of care as evidenced by provider attestation and co-signature. Inter-disciplinary care team collaboration (see longitudinal plan of care) Reviewed safety and fall prevention measures.  Tammy reports no falls since her last hospitalization. She continues using her walker. Tammy reports the stitches to her left foot have been removed but the wound has not healed. She is being followed by the [facility]. Also notes that the pulse to the top of her left foot was very difficult to detect during a recent provider appointment. She has a history of stent placement. She will follow up with  the Vascular team on 04/07/21.     Self-Care Deficits/Patient Goals:  -Utilize assistive device appropriately with all ambulation -Change positions slowly and wear non skid footwear when ambulating -Complete outreach with the Vascular team as scheduled on 04/08/11. -Continue weekly outreach with the Wound Clinic as scheduled -Contact provider or the care management team with questions or new concerns   Follow Up Plan Will follow up next month    Patient Care Plan: Wound to Left Foot     Problem Identified: Infection      Goal: Infection Prevented or Managed   Start Date: 04/06/2021  Expected End Date: 05/06/2021  Priority: High  Note:   Current Barriers:  Care Management support and educational needs related to left foot wound.  Clinical Goal(s):  Over the next 30 days, patient will not require hospitalizations for complications r/t wound infection.  Interventions:  Collaboration with Tammy Banana., MD regarding development and update of comprehensive plan of care as evidenced by provider attestation and co-signature Inter-disciplinary care team collaboration (see longitudinal plan of care) Discussed current treatment plan for wound care. Tammy reports that Mrs. Aliberti currently requires weekly visits to the [facility] Clinic for wound cleaning. She requires daily dressing changes. Dressings are currently being changed once a week by the Wound Care staff and three times a week by the [facility] team. Tammy confirmed that the family completes dressing changes on the remaining days. Discussed risk for delayed wound healing d/t diabetes. Thoroughly reviewed s/sx of wound infection and indications for immediately notifying the Wound Clinic. Advised to seek medical attention if worsening changes are noted after business hours.    Self Care Activities/Patient Goals: Self administer medications as prescribed Take antibiotics when prescribed and complete course as  instructed Attend weekly visits with the Wound Care clinic as scheduled Continue to monitor wound and reports s/sx of infection Calls provider office for questions or new concerns   Follow Up Plan:  Will follow up next month        PLAN:  A member of the care management team will follow up next month.   [facility] [phone]

## 2021-04-06 NOTE — Progress Notes (Signed)
Tammy [name] ([id number]) Visit Report for 04/01/2021 Chief Complaint Document Details Patient Name: Tammy [name], [name] Date of Service: 04/01/2021 10:15 AM Medical Record Number: [id number] Patient Account Number: [id number] Date of Birth/Sex: 03-22-1929 (87 y.o. F) Treating RN: Tammy [name] Primary Care Provider: Miguel [name] Other Clinician: Referring Provider: Miguel [name] Treating Provider/Extender: Tammy [name] in Treatment: 0 Information Obtained from: Patient Chief Complaint Left ankle wounds Electronic Signature(s) Signed: 04/06/2021 4:36:21 PM By: Tammy Shan DO Entered By: Tammy [name] on 04/06/2021 16:30:43 Sterne, Tammy [name] Kitchen ([id number]) -------------------------------------------------------------------------------- Debridement Details Patient Name: [name], [name] Date of Service: 04/01/2021 10:15 AM Medical Record Number: [id number] Patient Account Number: [id number] Date of Birth/Sex: [birth year] (87 y.o. F) Treating RN: Tammy [name] Primary Care Provider: Miguel [name] Other Clinician: Referring Provider: Miguel [name] Treating Provider/Extender: Tammy [name] in Treatment: 0 Debridement Performed for Wound #1 Left,Lateral Malleolus Assessment: Performed By: Physician Tammy Shan, MD Debridement Type: Debridement Level of Consciousness (Pre- Awake and Alert procedure): Pre-procedure Verification/Time Out Yes - 11:45 Taken: Start Time: 11:45 Total Area Debrided (L x W): 1.2 (cm) x 1 (cm) = 1.2 (cm) Tissue and other material Non-Viable, Slough, Slough debrided: Level: Non-Viable Tissue Debridement Description: Selective/Open Wound Instrument: Curette Bleeding: Minimum Hemostasis Achieved: Pressure Response to Treatment: Procedure was tolerated well Level of Consciousness (Post- Awake and Alert procedure): Post Debridement Measurements of Total Wound Length: (cm) 1.2 Width: (cm)  1 Depth: (cm) 1 Volume: (cm) 0.942 Character of Wound/Ulcer Post Debridement: Stable Post Procedure Diagnosis Same as Pre-procedure Electronic Signature(s) Signed: 04/01/2021 5:07:08 PM By: Tammy Amen RN Signed: 04/06/2021 4:36:21 PM By: Tammy Shan DO Entered By: Tammy [name] on 04/01/2021 11:50:37 [name], [name] ([id number]) -------------------------------------------------------------------------------- HPI Details Patient Name: [name], [name] Date of Service: 04/01/2021 10:15 AM Medical Record Number: [id number] Patient Account Number: [id number] Date of Birth/Sex: 1929-02-09 (87 y.o. F) Treating RN: Tammy [name] Primary Care Provider: Miguel [name] Other Clinician: Referring Provider: Miguel [name] Treating Provider/Extender: Tammy [name] in Treatment: 0 History of Present Illness HPI Description: Admission 7/11 Ms. Tammy [name] is a 87 year old female with a past medical history of paroxysmal A. fib not anticoagulated and type 2 diabetes on insulin that presents to the clinic for a 1-74-month history of nonhealing wound to her left ankle. She states she tripped and fell while trying to pick up some food her [name] left on her porch. She ended up cutting her left ankle. She went to that ED and had sutures placed. Unfortunately they did not stay in place and the wound dehisced. She has been using PolyMem silver to the wound. She currently denies signs of infection. Electronic Signature(s) Signed: 04/06/2021 4:36:21 PM By: Tammy Shan DO Entered By: Tammy [name] on 04/06/2021 16:32:09 [name], [name] ([id number]) -------------------------------------------------------------------------------- Physical Exam Details Patient Name: [name], [name] Date of Service: 04/01/2021 10:15 AM Medical Record Number: [id number] Patient Account Number: [id number] Date of Birth/Sex: 1929-08-07 (87 y.o. F) Treating RN: Tammy [name] Primary Care Provider: Miguel [name] Other Clinician: Referring Provider: Miguel [name] Treating Provider/Extender: Tammy [name] in Treatment: 0 Constitutional . Cardiovascular . Psychiatric . Notes Left ankle: Over the lateral malleolus there is an elongated wound with nonviable tissue throughout. There is also a small area of skin breakdown posterior to this. There are no signs of infection. Electronic Signature(s) Signed: 04/06/2021 4:36:21 PM By: Tammy Shan DO Entered By: Tammy [name] on 04/06/2021 16:32:47 [name], [name] ([id number]) -------------------------------------------------------------------------------- Physician Orders Details Patient Name: Tammy [name], [name] Date of Service:  04/01/2021 10:15 AM Medical Record Number: [id number] Patient Account Number: [id number] Date of Birth/Sex: [birth year] (87 y.o. 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 o Nurse Visit as needed Tammy [name]: - [facility] for wound care. May utilize formulary equivalent dressing for wound treatment orders unless otherwise specified. Home Health Nurse may visit PRN to address patientos wound care needs. o Scheduled days for dressing changes to be completed; exception, patient has scheduled wound care visit that day. Bathing/ Shower/ Hygiene o Clean wound with Normal Saline or wound cleanser. o No tub bath. Wound Treatment Wound #1 - Malleolus Wound Laterality: Left, Lateral Cleanser: Normal  Saline 1 x Per Day/30 Days Discharge Instructions: Wash your hands with soap and water. Remove old dressing, discard into plastic bag and place into trash. Cleanse the wound with Normal Saline prior to applying a clean dressing using gauze sponges, not tissues or cotton balls. Do not scrub or use excessive force. Pat dry using gauze sponges, not tissue or cotton balls. Topical: Santyl Collagenase Ointment, 30 (gm), tube 1 x Per Day/30 Days Discharge Instructions: Apply nickel thick to wound bed only Primary Dressing: Gauze 1 x Per Day/30 Days Discharge Instructions: Apply moist gauze to cover over santyl Secondary Dressing: ABD Pad 5x9 (in/in) 1 x Per Day/30 Days Discharge Instructions: Cover both wounds Secondary Dressing: Kerlix 4.5 x 4.1 (in/yd) 1 x Per Day/30 Days Discharge Instructions: Apply Kerlix 4.5 x 4.1 (in/yd) as instructed Secured With: 68M Medipore H Soft Cloth Surgical Tape, 2x2 (in/yd) 1 x Per Day/30 Days Discharge Instructions: Secure kerlix Wound #2 - Ankle Wound Laterality: Left Cleanser: Normal Saline 3 x Per Week/30 Days Discharge Instructions: Wash your hands with soap and water. Remove old dressing, discard into plastic bag and place into trash. Cleanse the wound with Normal Saline prior to applying a clean dressing using gauze sponges, not tissues or cotton balls. Do not scrub or use excessive force. Pat dry using gauze sponges, not tissue or cotton balls. Primary Dressing: Silvercel Small 2x2 (in/in) 3 x Per Week/30 Days Discharge Instructions: Apply Silvercel Small 2x2 (in/in) as instructed Secondary Dressing: ABD Pad 5x9 (in/in) 3 x Per Week/30 Days Discharge Instructions: Cover both wounds Secondary Dressing: Kerlix 4.5 x 4.1 (in/yd) 3 x Per Week/30 Days Discharge Instructions: Apply Kerlix 4.5 x 4.1 (in/yd) as instructed Nahm, [name] ([id number]) Secured With: 68M Medipore H Soft Cloth Surgical Tape, 2x2 (in/yd) 3 x Per Week/30 Days Discharge  Instructions: Secure kerlix Radiology o X-ray, ankle - Left ankle Services and Therapies o Arterial Studies- Bilateral - AVVS Patient Medications Allergies: latex, bacitracin, lidocaine, Aricept, benzalkonium, ibuprofen, valdecoxib, albuterol, tape, permeable adhesive, triamcinolone Notifications Medication Indication Start End Santyl 04/01/2021 DOSE topical 250 unit/gram ointment - Apply nickel thick to wound bed daily Electronic Signature(s) Signed: 04/01/2021 5:07:08 PM By: Tammy Amen RN Signed: 04/06/2021 4:36:21 PM By: Tammy Shan DO Entered By: Tammy [name] on 04/01/2021 12:02:51 [name] ([id number]) -------------------------------------------------------------------------------- Prescription 04/01/2021 Patient Name: Tammy [name], Kassidi L. Provider: Kalman Shan MD Date of Birth: 1929-05-30 NPI#: [id number] Sex: F DEA#: Phone #: [phone] License #: [id number] Patient Address: [facility] [place], [place] [address] [address] [place], [place] [address] [phone] Allergies latex; bacitracin; lidocaine; Aricept; benzalkonium; ibuprofen; valdecoxib; albuterol; tape, permeable adhesive; triamcinolone Medication Medication: Route: Strength: Form: Santyl topical 250 unit/gram ointment Class: TOPICAL/MUCOUS MEMBR./SUBCUT. ENZYMES Dose: Frequency / Time: Indication: Apply nickel thick to wound bed daily Number of Refills: Number of Units: 1 Generic Substitution: Start Date: End Date: Administered at Facility: Do Not Substitute 04/01/2021 No Note to Pharmacy: Wound Size 3.5x1x1 Hand Signature: Date(s): Electronic Signature(s) Signed: 04/01/2021 5:07:08 PM By: Tammy Amen RN Signed: 04/06/2021 4:36:21 PM By: Tammy Shan DO Entered By: Tammy [name] on 04/01/2021 12:02:52 Vanderpool, [name]  ([id number]) --------------------------------------------------------------------------------  Problem List Details Patient Name: [name], [name] Date of Service: 04/01/2021 10:15 AM Medical Record Number: [id number] Patient Account Number: [id number] Date of Birth/Sex: 1929-03-09 (87 y.o. 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 ([id number]) -------------------------------------------------------------------------------- Progress Note Details Patient Name: Lukehart, Anayah L. Date of Service: 04/01/2021 10:15 AM Medical Record Number: [id number] Patient Account Number: [id number] Date of Birth/Sex: Dec 23, 1928 (87 y.o. F) Treating RN: Tammy [name] Primary Care Provider: Miguel [name] Other Clinician: Referring Provider: Miguel [name] Treating Provider/Extender: Tammy [name] in Treatment: 0 Subjective Chief Complaint Information obtained from Patient Left ankle wounds History of Present Illness (HPI) Admission 7/11 Ms. Safina Huard is a 87 year old female with a past medical history of paroxysmal A. fib not anticoagulated and type 2 diabetes on insulin that presents to the clinic for a 1-78-month history of nonhealing wound to her left ankle. She states she tripped and fell while trying to pick up some food her [name] left on her porch. She ended up cutting her left ankle. She went to  that ED and had sutures placed. Unfortunately they did not stay in place and the wound dehisced. She has been using PolyMem silver to the wound. She currently denies signs of infection. Patient History Information obtained from Patient. Allergies latex, bacitracin, lidocaine, Aricept, benzalkonium, ibuprofen, valdecoxib, albuterol, tape, permeable adhesive, triamcinolone Social History Never smoker. Medical History Eyes Denies history of Cataracts, Glaucoma, Optic Neuritis Ear/Nose/Mouth/Throat Denies history of Chronic sinus problems/congestion, Middle ear problems Hematologic/Lymphatic Denies history of Anemia, Hemophilia, Human Immunodeficiency Virus, Lymphedema, Sickle Cell Disease Respiratory Patient has history of Chronic Obstructive Pulmonary Disease (COPD) Cardiovascular Patient has history of Arrhythmia - Afib, Coronary Artery Disease, Hypertension Gastrointestinal Denies history of Cirrhosis , Colitis, Crohn s, Hepatitis A, Hepatitis B, Hepatitis C Endocrine Patient has history of Type II Diabetes Denies history of Type I Diabetes Genitourinary Denies history of End Stage Renal Disease Immunological Denies history of Lupus Erythematosus, Raynaud s, Scleroderma Integumentary (Skin) Denies history of History of Burn, History of pressure wounds Musculoskeletal Patient has history of Osteoarthritis Denies history of Gout, Rheumatoid Arthritis, Osteomyelitis Neurologic Patient has history of Neuropathy Denies history of Dementia, Quadriplegia, Paraplegia, Seizure Disorder Oncologic Denies history of Received Chemotherapy, Received Radiation Patient is treated with Insulin. Blood sugar is tested. Review of Systems (ROS) Constitutional Symptoms (General Health) Denies complaints or symptoms  of Fatigue, Fever, Chills, Marked Weight Change. Eyes Denies complaints or symptoms of Dry Eyes, Vision Changes, Glasses / Contacts. Ear/Nose/Mouth/Throat Denies complaints or  symptoms of Difficult clearing ears, Sinusitis. Hematologic/Lymphatic Denies complaints or symptoms of Bleeding / Clotting Disorders, Human Immunodeficiency Virus. Respiratory [name], [name] ([id number]) Denies complaints or symptoms of Chronic or frequent coughs, Shortness of Breath. Cardiovascular Denies complaints or symptoms of Chest pain, LE edema. Gastrointestinal Denies complaints or symptoms of Frequent diarrhea, Nausea, Vomiting. Endocrine Denies complaints or symptoms of Hepatitis, Thyroid disease, Polydypsia (Excessive Thirst). Genitourinary Denies complaints or symptoms of Kidney failure/ Dialysis, Incontinence/dribbling. Immunological Denies complaints or symptoms of Hives, Itching. Integumentary (Skin) Complains or has symptoms of Wounds. Denies complaints or symptoms of Bleeding or bruising tendency, Breakdown, Swelling. Musculoskeletal Denies complaints or symptoms of Muscle Pain, Muscle Weakness. Neurologic Denies complaints or symptoms of Numbness/parasthesias, Focal/Weakness. Psychiatric Denies complaints or symptoms of Anxiety, Claustrophobia. Objective Constitutional Vitals Time Taken: 10:48 AM, Height: 62 in, Source: Stated, Temperature: 98.3 F, Pulse: 99 bpm, Respiratory Rate: 18 breaths/min, Blood Pressure: 157/77 mmHg. General Notes: Left ankle: Over the lateral malleolus there is an elongated wound with nonviable tissue throughout. There is also a small area of skin breakdown posterior to this. There are no signs of infection. Integumentary (Hair, Skin) Wound #1 status is Open. Original cause of wound was Surgical Injury. The date acquired was: 02/08/2021. The wound is located on the Left,Lateral Malleolus. The wound measures 1.2cm length x 1cm width x 0.8cm depth; 0.942cm^2 area and 0.754cm^3 volume. There is Fat Layer (Subcutaneous Tissue) exposed. There is no tunneling or undermining noted. There is a medium amount of serosanguineous  drainage noted. There is no granulation within the wound bed. There is a large (67-100%) amount of necrotic tissue within the wound bed including Adherent Slough. Wound #2 status is Open. Original cause of wound was Skin Tear/Laceration. The date acquired was: 02/08/2021. The wound is located on the Left Ankle. The wound measures 1.2cm length x 0.8cm width x 0.1cm depth; 0.754cm^2 area and 0.075cm^3 volume. There is Fat Layer (Subcutaneous Tissue) exposed. There is no tunneling or undermining noted. There is a medium amount of sanguinous drainage noted. There is large (67-100%) red granulation within the wound bed. There is no necrotic tissue within the wound bed. 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 I debrided nonviable tissue. I recommended using Santyl to the lateral wound and to the smaller wound silver alginate. No signs of infection on exam. I will see her in 1 week at follow-up Procedures Wound #1 Pre-procedure diagnosis of Wound #1 is a Dehisced Wound located on the Left,Lateral Malleolus . There was a Selective/Open Wound Non-Viable Bouie, Glorya L. ([id number]) Tissue Debridement with a total area of 1.2 sq cm performed by Tammy Shan, MD. With the following instrument(s): Curette to remove Non- Viable tissue/material. Material removed includes [place]. A time out was conducted at 11:45, prior to the start of the procedure. A Minimum amount of bleeding was controlled with Pressure. The procedure was tolerated well. Post Debridement Measurements: 1.2cm length x 1cm width x 1cm depth; 0.942cm^3 volume. Character of Wound/Ulcer Post Debridement is stable. Post procedure Diagnosis Wound #1: Same as Pre-Procedure Plan Follow-up Appointments: Return Appointment in 1 week. Nurse Visit as  needed [facility] Health: [facility] for wound care. May  utilize formulary equivalent dressing for wound treatment orders unless otherwise specified. Home Health Nurse may visit PRN to address patient s wound care needs. Scheduled days for dressing changes to be completed; exception, patient has scheduled wound care visit that day. Bathing/ Shower/ Hygiene: Clean wound with Normal Saline or wound cleanser. No tub bath. Radiology ordered were: X-ray, ankle - Left ankle Services and Therapies ordered were: Arterial Studies- Bilateral - AVVS The following medication(s) was prescribed: Santyl topical 250 unit/gram ointment Apply nickel thick to wound bed daily starting 04/01/2021 WOUND #1: - Malleolus Wound Laterality: Left, Lateral Cleanser: Normal Saline 1 x Per Day/30 Days Discharge Instructions: Wash your hands with soap and water. Remove old dressing, discard into plastic bag and place into trash. Cleanse the wound with Normal Saline prior to applying a clean dressing using gauze sponges, not tissues or cotton balls. Do not scrub or use excessive force. Pat dry using gauze sponges, not tissue or cotton balls. Topical: Santyl Collagenase Ointment, 30 (gm), tube 1 x Per Day/30 Days Discharge Instructions: Apply nickel thick to wound bed only Primary Dressing: Gauze 1 x Per Day/30 Days Discharge Instructions: Apply moist gauze to cover over santyl Secondary Dressing: ABD Pad 5x9 (in/in) 1 x Per Day/30 Days Discharge Instructions: Cover both wounds Secondary Dressing: Kerlix 4.5 x 4.1 (in/yd) 1 x Per Day/30 Days Discharge Instructions: Apply Kerlix 4.5 x 4.1 (in/yd) as instructed Secured With: 52M Medipore H Soft Cloth Surgical Tape, 2x2 (in/yd) 1 x Per Day/30 Days Discharge Instructions: Secure kerlix WOUND #2: - Ankle Wound Laterality: Left Cleanser: Normal Saline 3 x Per Week/30 Days Discharge Instructions: Wash your hands with soap and water. Remove  old dressing, discard into plastic bag and place into trash. Cleanse the wound with Normal Saline prior to applying a clean dressing using gauze sponges, not tissues or cotton balls. Do not scrub or use excessive force. Pat dry using gauze sponges, not tissue or cotton balls. Primary Dressing: Silvercel Small 2x2 (in/in) 3 x Per Week/30 Days Discharge Instructions: Apply Silvercel Small 2x2 (in/in) as instructed Secondary Dressing: ABD Pad 5x9 (in/in) 3 x Per Week/30 Days Discharge Instructions: Cover both wounds Secondary Dressing: Kerlix 4.5 x 4.1 (in/yd) 3 x Per Week/30 Days Discharge Instructions: Apply Kerlix 4.5 x 4.1 (in/yd) as instructed Secured With: 52M Medipore H Soft Cloth Surgical Tape, 2x2 (in/yd) 3 x Per Week/30 Days Discharge Instructions: Secure kerlix 1. Debridement 2. Santyl to the lateral malleolus wound and silver alginate to the smaller wound 3. Follow-up in 1 week Electronic Signature(s) Signed: 04/06/2021 4:36:21 PM By: Tammy Shan DO Entered By: Tammy [name] on 04/06/2021 16:35:56 Gift, Zophia Carlean Jews ([id number]) -------------------------------------------------------------------------------- ROS/PFSH Details Patient Name: Tammy [name], [name] Date of Service: 04/01/2021 10:15 AM Medical Record Number: [id number] Patient Account Number: [id number] Date of Birth/Sex: 10-08-1928 (87 y.o. F) Treating RN: Tammy [name] Primary Care Provider: Miguel [name] Other Clinician: Referring Provider: Miguel [name] Treating Provider/Extender: Tammy [name] in Treatment: 0 Information Obtained From Patient Constitutional Symptoms (General Health) Complaints and Symptoms: Negative for: Fatigue; Fever; Chills; Marked Weight Change Eyes Complaints and Symptoms: Negative for: Dry Eyes; Vision Changes; Glasses / Contacts Medical History: Negative for: Cataracts; Glaucoma; Optic Neuritis Ear/Nose/Mouth/Throat Complaints and Symptoms: Negative for:  Difficult clearing ears; Sinusitis Medical History: Negative for: Chronic sinus problems/congestion; Middle ear problems Hematologic/Lymphatic Complaints and Symptoms: Negative for: Bleeding / Clotting Disorders; Human Immunodeficiency Virus Medical History: Negative for: Anemia; Hemophilia; Human Immunodeficiency Virus; Lymphedema; Sickle Cell Disease Respiratory Complaints and Symptoms: Negative for: Chronic  or frequent coughs; Shortness of Breath Medical History: Positive for: Chronic Obstructive Pulmonary Disease (COPD) Cardiovascular Complaints and Symptoms: Negative for: Chest pain; LE edema Medical History: Positive for: Arrhythmia - Afib; Coronary Artery Disease; Hypertension Gastrointestinal Complaints and Symptoms: Negative for: Frequent diarrhea; Nausea; Vomiting Medical History: Negative for: Cirrhosis ; Colitis; Crohnos; Hepatitis A; Hepatitis B; Hepatitis C Endocrine Complaints and Symptoms: Negative for: Hepatitis; Thyroid disease; Polydypsia (Excessive Thirst) Saltzman, Jamani L. ([id number]) Medical History: Positive for: Type II Diabetes Negative for: Type I Diabetes Time with diabetes: 118 Treated with: Insulin Blood sugar tested every day: Yes Tested : Genitourinary Complaints and Symptoms: Negative for: Kidney failure/ Dialysis; Incontinence/dribbling Medical History: Negative for: End Stage Renal Disease Immunological Complaints and Symptoms: Negative for: Hives; Itching Medical History: Negative for: Lupus Erythematosus; Raynaudos; Scleroderma Integumentary (Skin) Complaints and Symptoms: Positive for: Wounds Negative for: Bleeding or bruising tendency; Breakdown; Swelling Medical History: Negative for: History of Burn; History of pressure wounds Musculoskeletal Complaints and Symptoms: Negative for: Muscle Pain; Muscle Weakness Medical History: Positive for: Osteoarthritis Negative for: Gout; Rheumatoid Arthritis;  Osteomyelitis Neurologic Complaints and Symptoms: Negative for: Numbness/parasthesias; Focal/Weakness Medical History: Positive for: Neuropathy Negative for: Dementia; Quadriplegia; Paraplegia; Seizure Disorder Psychiatric Complaints and Symptoms: Negative for: Anxiety; Claustrophobia Oncologic Medical History: Negative for: Received Chemotherapy; Received Radiation Immunizations Pneumococcal Vaccine: Received Pneumococcal Vaccination: No Implantable Devices None Family and Social History RICHA, SHOR ([id number]) Never smoker Electronic Signature(s) Signed: 04/01/2021 5:07:08 PM By: Tammy Amen RN Signed: 04/06/2021 4:36:21 PM By: Tammy Shan DO Entered By: Tammy [name] on 04/01/2021 10:56:09 Fussner, [place] ([id number]) -------------------------------------------------------------------------------- SuperBill Details Patient Name: Gerst, Borghild L. Date of Service: 04/01/2021 Medical Record Number: [id number] Patient Account Number: [id number] Date of Birth/Sex: [birth year] (87 y.o. 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 E11.621 Type 2 diabetes mellitus with foot ulcer Facility Procedures CPT4 Code: [id number] Description: 30051 - WOUND CARE VISIT-LEV 3 EST PT Modifier: Quantity: 1 CPT4 Code: [id number] Description: 56701 - DEBRIDE WOUND 1ST 20 SQ CM OR < Modifier: Quantity: 1 CPT4 Code: Description: ICD-10 Diagnosis Description 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 Modifier: Quantity: Physician Procedures CPT4 Code: [id number] Description: WC PHYS LEVEL 3 o NEW PT Modifier: Quantity: 1 CPT4 Code: Description: ICD-10 Diagnosis  Description 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

## 2021-04-07 ENCOUNTER — Ambulatory Visit (INDEPENDENT_AMBULATORY_CARE_PROVIDER_SITE_OTHER): Payer: Medicare Other | Admitting: Vascular Surgery

## 2021-04-07 ENCOUNTER — Other Ambulatory Visit: Payer: Self-pay

## 2021-04-07 ENCOUNTER — Ambulatory Visit (INDEPENDENT_AMBULATORY_CARE_PROVIDER_SITE_OTHER): Payer: Medicare Other

## 2021-04-07 ENCOUNTER — Other Ambulatory Visit (INDEPENDENT_AMBULATORY_CARE_PROVIDER_SITE_OTHER): Payer: Self-pay | Admitting: Nurse Practitioner

## 2021-04-07 ENCOUNTER — Encounter (INDEPENDENT_AMBULATORY_CARE_PROVIDER_SITE_OTHER): Payer: Self-pay | Admitting: Vascular Surgery

## 2021-04-07 VITALS — BP 176/73 | HR 98 | Resp 16 | Wt 140.2 lb

## 2021-04-07 DIAGNOSIS — I6523 Occlusion and stenosis of bilateral carotid arteries: Secondary | ICD-10-CM

## 2021-04-07 DIAGNOSIS — I7025 Atherosclerosis of native arteries of other extremities with ulceration: Secondary | ICD-10-CM | POA: Insufficient documentation

## 2021-04-07 DIAGNOSIS — I774 Celiac artery compression syndrome: Secondary | ICD-10-CM | POA: Diagnosis not present

## 2021-04-07 DIAGNOSIS — I771 Stricture of artery: Secondary | ICD-10-CM

## 2021-04-07 DIAGNOSIS — I10 Essential (primary) hypertension: Secondary | ICD-10-CM | POA: Diagnosis not present

## 2021-04-07 DIAGNOSIS — M79605 Pain in left leg: Secondary | ICD-10-CM

## 2021-04-07 NOTE — Assessment & Plan Note (Signed)
ABIs were actually in the normal range at 1.3 on the right and 1.1 on the left with biphasic waveforms.  Her digital pressure was normal on the right but was reduced on the left and only 40 although the waveform was still present. Her flow should be adequate for wound healing although there is definitely some small vessel disease present.  I will hold on any intervention at this time but I will plan a short interval follow-up of about 3 months with noninvasive studies.  Should her wound take a turn for the worse, I would plan an angiogram in the near future.

## 2021-04-07 NOTE — Progress Notes (Signed)
MRN : [id number]  [name] is a 87 y.o. (02/21/1929) female who presents with chief complaint of  Chief Complaint  Patient presents with   Follow-up    Add per phone note.ABI  .  History of Present Illness: Patient returns today in follow up of her PAD on request from the wound care center.  She has multiple vascular issues we have been following.  Things have been reasonably stable, but she had a mild trauma to her left lateral ankle and this has been very slow to heal.  She has known PAD with previous interventions.  For this reason, perfusion was checked today.  ABIs were actually in the normal range at 1.3 on the right and 1.1 on the left with biphasic waveforms.  Her digital pressure was normal on the right but was reduced on the left and only 40 although the waveform was still present.  Current Outpatient Medications  Medication Sig Dispense Refill   ADVAIR HFA 230-21 MCG/ACT inhaler TAKE 2 PUFFS INTO LUNGS TWICE A DAY (Patient taking differently: Inhale 2 puffs into the lungs 2 (two) times daily.) 12 g 6   albuterol (VENTOLIN HFA) 108 (90 Base) MCG/ACT inhaler INHALE 2 PUFFS INTO THE LUNGS EVERY 4 HOURS AS NEEDED FOR WHEEZING ORSHORTNESS OF BREATH (Patient taking differently: Inhale 2 puffs into the lungs every 4 (four) hours as needed for wheezing or shortness of breath. INHALE 2 PUFFS INTO THE LUNGS EVERY 4 HOURS AS NEEDED FOR WHEEZING ORSHORTNESS OF BREATH) 8.5 g 10   aspirin 325 MG tablet Take 325 mg by mouth daily. Reported on 01/05/2016     atorvastatin (LIPITOR) 10 MG tablet Take 1 tablet (10 mg total) by mouth daily. 90 tablet 1   cetirizine (ZYRTEC) 10 MG tablet TAKE ONE TABLET BY MOUTH EVERY DAY (Patient taking differently: Take 10 mg by mouth daily.) 90 tablet 3   cholecalciferol (VITAMIN D3) 25 MCG (1000 UT) tablet Take 1,000 Units by mouth daily.     clopidogrel (PLAVIX) 75 MG tablet TAKE 1 TABLET BY MOUTH DAILY 30 tablet 3   diltiazem (TIADYLT ER) 360 MG 24 hr  capsule Hold until followup with your outpatient doctor due to your blood pressure on the low side. 30 capsule 3   ezetimibe (ZETIA) 10 MG tablet TAKE 1 TABLET BY MOUTH DAILY (Patient taking differently: Take 10 mg by mouth daily.) 30 tablet 12   FLUoxetine (PROZAC) 20 MG capsule Take 1 capsule (20 mg total) by mouth daily. 90 capsule 0   glucose blood test strip ACCU-CHEK AVIVA PLUS TEST STRP Use to check sugars 3 times daily.     insulin glargine (LANTUS) 100 UNIT/ML injection Inject 0.05 mLs (5 Units total) into the skin at bedtime. 10 mL 11   insulin lispro (HUMALOG) 100 UNIT/ML KiwkPen Inject 10 Units into the skin 3 (three) times daily. 10 units before breakfast, 8 before lunch and 18 before supper     isosorbide mononitrate (IMDUR) 30 MG 24 hr tablet Take 30 mg by mouth daily.     lansoprazole (PREVACID) 30 MG capsule TAKE 1 CAPSULE BY MOUTH ONCE DAILY AT NOON (Patient taking differently: Take 30 mg by mouth daily at 12 noon. TAKE 1 CAPSULE BY MOUTH ONCE DAILY AT NOON) 90 capsule 1   losartan (COZAAR) 100 MG tablet Take 0.5 tablets (50 mg total) by mouth daily. This is a decrease from 100 mg daily.     Magnesium 400 MG CAPS Take by mouth daily.  magnesium oxide (MAG-OX) 400 MG tablet Take 400 mg by mouth daily.     meloxicam (MOBIC) 7.5 MG tablet Take 1 tablet (7.5 mg total) by mouth daily as needed for pain. 30 tablet 2   memantine (NAMENDA) 5 MG tablet TAKE ONE TABLET TWICE DAILY (Patient taking differently: Take 5 mg by mouth 2 (two) times daily.) 60 tablet 3   montelukast (SINGULAIR) 10 MG tablet TAKE ONE TABLET AT BEDTIME (Patient taking differently: Take 10 mg by mouth at bedtime.) 90 tablet 1   ondansetron (ZOFRAN-ODT) 4 MG disintegrating tablet Take 4 mg by mouth every 8 (eight) hours as needed for nausea. For up to 12 doses.     QUEtiapine (SEROQUEL) 25 MG tablet Take 1 tablet by mouth at bedtime.     traZODone (DESYREL) 50 MG tablet TAKE 1 AND 1/2 TABLET AT BEDTIME AS NEEDED FOR  SLEEP (Patient taking differently: Take 75 mg by mouth at bedtime as needed for sleep.) 45 tablet 3   TUSSIN DM 100-10 MG/5ML liquid TAKE 1 TEASPOONFUL BY MOUTH EVERY 4 HOURS AS NEEDED FOR COUGH 118 mL 0   vitamin B-12 1000 MCG tablet Take 1 tablet (1,000 mcg total) by mouth daily. 14 tablet 0   vitamin E 1000 UNIT capsule Take 1,000 Units by mouth daily.     iron polysaccharides (NIFEREX) 150 MG capsule Take 1 capsule (150 mg total) by mouth daily. 30 capsule 0   pantoprazole (PROTONIX) 40 MG tablet Take 40 mg by mouth daily. (Patient not taking: No sig reported)     No current facility-administered medications for this visit.    Past Medical History:  Diagnosis Date   Angina pectoris (HCC)    Atrial fibrillation (HCC)    Basal cell carcinoma    nose   Cervicalgia    Chronic back pain    Chronic obstructive pulmonary disease (COPD) (HCC)    COPD (chronic obstructive pulmonary disease) (HCC)    DDD (degenerative disc disease), lumbar    Diabetes mellitus without complication (HCC)    Dysphagia    Hyperlipemia    Hypertension    Squamous cell carcinoma of skin    left lateral pretibial   Vulvovaginitis     Past Surgical History:  Procedure Laterality Date   gall bladder     melanoma     removal neck and back   PERIPHERAL VASCULAR CATHETERIZATION Left 01/05/2016   Procedure: Lower Extremity Angiography;  Surgeon: Algernon Huxley, MD;  Location: [facility];  Service: Cardiovascular;  Laterality: Left;   PERIPHERAL VASCULAR CATHETERIZATION  01/05/2016   Procedure: Lower Extremity Intervention;  Surgeon: Algernon Huxley, MD;  Location: [facility];  Service: Cardiovascular;;   ureterolithiasis     calculus removed   VAGINAL HYSTERECTOMY     VISCERAL ANGIOGRAPHY N/A 05/10/2019   Procedure: VISCERAL ANGIOGRAPHY;  Surgeon: Algernon Huxley, MD;  Location: [facility];  Service: Cardiovascular;  Laterality: N/A;   VULVA / PERINEUM BIOPSY  05/29/2015     Social  History   Tobacco Use   Smoking status: Never   Smokeless tobacco: Never  Vaping Use   Vaping Use: Never used  Substance Use Topics   Alcohol use: No   Drug use: No       Family History  Problem Relation Age of Onset   Ovarian cancer Other    Diabetes Sister    Colon cancer Brother    Diabetes Brother    Atrial fibrillation  Daughter    Breast cancer Neg Hx      Allergies  Allergen Reactions   Bacitracin-Neomycin-Polymyxin Rash   Neomycin-Bacitracin Zn-Polymyx Swelling and Rash   Latex Rash   Lidocaine Rash   Aricept [Donepezil Hcl] Diarrhea   Benzalkonium Chloride Itching and Swelling   Ibuprofen     Other reaction(s): Dizziness Heart fluttering. Tachycardia. Tachycardia.   Lidocaine Hcl Itching    Per pt, "all caine meds cause severe itching"   Valdecoxib Nausea And Vomiting   Albuterol Rash   Tape Rash    blisters   Triamcinolone Rash    REVIEW OF SYSTEMS (Negative unless checked)   Constitutional: [] Weight loss  [] Fever  [] Chills Cardiac: [] Chest pain   [] Chest pressure   [] Palpitations   [] Shortness of breath when laying flat   [] Shortness of breath at rest   [] Shortness of breath with exertion. Vascular:  [x] Pain in legs with walking   [x] Pain in legs at rest   [] Pain in legs when laying flat   [] Claudication   [] Pain in feet when walking  [] Pain in feet at rest  [] Pain in feet when laying flat   [] History of DVT   [] Phlebitis   [] Swelling in legs   [] Varicose veins   [x] Non-healing ulcers Pulmonary:   [] Uses home oxygen   [] Productive cough   [] Hemoptysis   [] Wheeze  [] COPD   [] Asthma Neurologic:  [x] Dizziness  [] Blackouts   [] Seizures   [] History of stroke   [x] History of TIA  [] Aphasia   [] Temporary blindness   [] Dysphagia   [] Weakness or numbness in arms   [] Weakness or numbness in legs Musculoskeletal:  [x] Arthritis   [] Joint swelling   [x] Joint pain   [] Low back pain Hematologic:  [x] Easy bruising  [] Easy bleeding   [] Hypercoagulable state   [] Anemic    Gastrointestinal:  [] Blood in stool   [] Vomiting blood  [] Gastroesophageal reflux/heartburn   [] Abdominal pain Genitourinary:  [] Chronic kidney disease   [] Difficult urination  [] Frequent urination  [] Burning with urination   [] Hematuria Skin:  [] Rashes   [x] Ulcers   [x] Wounds Psychological:  [] History of anxiety   []  History of major depression.  Physical Examination  BP (!) 176/73 (BP Location: Right Arm)   Pulse 98   Resp 16   Wt 140 lb 3.2 oz (63.6 kg)   BMI 25.64 kg/m  Gen:  WD/WN, Elderly, NAD Head: [place]/AT, No temporalis wasting. Ear/Nose/Throat: Hearing grossly intact, nares w/o erythema or drainage Eyes: Conjunctiva clear. Sclera non-icteric Neck: Supple.  Trachea midline Pulmonary:  Good air movement, no use of accessory muscles.  Cardiac: somewhat irregular Vascular:  Vessel Right Left  Radial Palpable Palpable                          PT 1+ Palpable 1+ Palpable  DP 2+ Palpable 1+ Palpable   Gastrointestinal: soft, non-tender/non-distended. No guarding/reflex.  Musculoskeletal: M/S 5/5 throughout.  No deformity or atrophy.  Dressed wound on the left lateral ankle.  Trace lower extremity edema. Neurologic: Sensation grossly intact in extremities.  Symmetrical.  Speech is fluent.  Psychiatric: Judgment intact, Mood & affect appropriate for pt's clinical situation. Dermatologic: No rashes or ulcers noted.  No cellulitis or open wounds.      Labs Recent Results (from the past 2160 hour(s))  CBC with Differential     Status: Abnormal   Collection Time: 02/08/21 12:00 PM  Result Value Ref Range   WBC 7.3 4.0 - 10.5 K/uL  RBC 3.39 (L) 3.87 - 5.11 MIL/uL   Hemoglobin 10.2 (L) 12.0 - 15.0 g/dL   HCT 29.9 (L) 36.0 - 46.0 %   MCV 88.2 80.0 - 100.0 fL   MCH 30.1 26.0 - 34.0 pg   MCHC 34.1 30.0 - 36.0 g/dL   RDW 15.0 11.5 - 15.5 %   Platelets 215 150 - 400 K/uL   nRBC 0.3 (H) 0.0 - 0.2 %   Neutrophils Relative % 54 %   Neutro Abs 3.8 1.7 - 7.7 K/uL    Lymphocytes Relative 32 %   Lymphs Abs 2.3 0.7 - 4.0 K/uL   Monocytes Relative 13 %   Monocytes Absolute 1.0 0.1 - 1.0 K/uL   Eosinophils Relative 1 %   Eosinophils Absolute 0.1 0.0 - 0.5 K/uL   Basophils Relative 0 %   Basophils Absolute 0.0 0.0 - 0.1 K/uL   Immature Granulocytes 0 %   Abs Immature Granulocytes 0.03 0.00 - 0.07 K/uL    Comment: Performed at [facility], [address] [place], [place] [address]  Comprehensive metabolic panel     Status: Abnormal   Collection Time: 02/08/21 12:00 PM  Result Value Ref Range   Sodium 131 (L) 135 - 145 mmol/L   Potassium 3.7 3.5 - 5.1 mmol/L   Chloride 99 98 - 111 mmol/L   CO2 22 22 - 32 mmol/L   Glucose, Bld 325 (H) 70 - 99 mg/dL    Comment: Glucose reference range applies only to samples taken after fasting for at least 8 hours.   BUN 22 8 - 23 mg/dL   Creatinine, Ser 1.06 (H) 0.44 - 1.00 mg/dL   Calcium 7.9 (L) 8.9 - 10.3 mg/dL   Total Protein 5.5 (L) 6.5 - 8.1 g/dL   Albumin 2.9 (L) 3.5 - 5.0 g/dL   AST 19 15 - 41 U/L   ALT 14 0 - 44 U/L   Alkaline Phosphatase 65 38 - 126 U/L   Total Bilirubin 0.5 0.3 - 1.2 mg/dL   GFR, Estimated 50 (L) >60 mL/min    Comment: (NOTE) Calculated using the CKD-EPI Creatinine Equation (2021)    Anion gap 10 5 - 15    Comment: Performed at [facility], [address] [place], [place] [address]  Hemoglobin A1c     Status: Abnormal   Collection Time: 02/08/21 12:00 PM  Result Value Ref Range   Hgb A1c MFr Bld 7.6 (H) 4.8 - 5.6 %    Comment: (NOTE) Pre diabetes:          5.7%-6.4%  Diabetes:              >6.4%  Glycemic control for   <7.0% adults with diabetes    Mean Plasma Glucose 171.42 mg/dL    Comment: Performed at [facility], [place] [address], [place], [place] [address]  CBG monitoring, ED     Status: Abnormal   Collection Time: 02/08/21 12:59 PM  Result Value Ref Range   Glucose-Capillary 295 (H) 70 - 99 mg/dL    Comment: Glucose reference range  applies only to samples taken after fasting for at least 8 hours.  CBG monitoring, ED     Status: Abnormal   Collection Time: 02/08/21  2:03 PM  Result Value Ref Range   Glucose-Capillary 221 (H) 70 - 99 mg/dL    Comment: Glucose reference range applies only to samples taken after fasting for at least 8 hours.  CBC with Differential  Status: Abnormal   Collection Time: 02/09/21 10:25 PM  Result Value Ref Range   WBC 11.7 (H) 4.0 - 10.5 K/uL   RBC 2.85 (L) 3.87 - 5.11 MIL/uL   Hemoglobin 8.5 (L) 12.0 - 15.0 g/dL   HCT 25.3 (L) 36.0 - 46.0 %   MCV 88.8 80.0 - 100.0 fL   MCH 29.8 26.0 - 34.0 pg   MCHC 33.6 30.0 - 36.0 g/dL   RDW 15.2 11.5 - 15.5 %   Platelets 239 150 - 400 K/uL   nRBC 0.0 0.0 - 0.2 %   Neutrophils Relative % 56 %   Neutro Abs 6.6 1.7 - 7.7 K/uL   Lymphocytes Relative 31 %   Lymphs Abs 3.6 0.7 - 4.0 K/uL   Monocytes Relative 12 %   Monocytes Absolute 1.4 (H) 0.1 - 1.0 K/uL   Eosinophils Relative 1 %   Eosinophils Absolute 0.1 0.0 - 0.5 K/uL   Basophils Relative 0 %   Basophils Absolute 0.0 0.0 - 0.1 K/uL   Immature Granulocytes 0 %   Abs Immature Granulocytes 0.04 0.00 - 0.07 K/uL    Comment: Performed at [facility], [address] [place], [place] [address]  Comprehensive metabolic panel     Status: Abnormal   Collection Time: 02/09/21 10:25 PM  Result Value Ref Range   Sodium 130 (L) 135 - 145 mmol/L   Potassium 4.2 3.5 - 5.1 mmol/L   Chloride 98 98 - 111 mmol/L   CO2 22 22 - 32 mmol/L   Glucose, Bld 173 (H) 70 - 99 mg/dL    Comment: Glucose reference range applies only to samples taken after fasting for at least 8 hours.   BUN 29 (H) 8 - 23 mg/dL   Creatinine, Ser 1.34 (H) 0.44 - 1.00 mg/dL   Calcium 8.0 (L) 8.9 - 10.3 mg/dL   Total Protein 5.9 (L) 6.5 - 8.1 g/dL   Albumin 3.1 (L) 3.5 - 5.0 g/dL   AST 17 15 - 41 U/L   ALT 13 0 - 44 U/L   Alkaline Phosphatase 63 38 - 126 U/L   Total Bilirubin 0.6 0.3 - 1.2 mg/dL   GFR, Estimated 38 (L)  >60 mL/min    Comment: (NOTE) Calculated using the CKD-EPI Creatinine Equation (2021)    Anion gap 10 5 - 15    Comment: Performed at [facility], [place], [place] [address]  Troponin I (High Sensitivity)     Status: None   Collection Time: 02/09/21 10:25 PM  Result Value Ref Range   Troponin I (High Sensitivity) 10 <18 ng/L    Comment: (NOTE) Elevated high sensitivity troponin I (hsTnI) values and significant  changes across serial measurements may suggest ACS but many other  chronic and acute conditions are known to elevate hsTnI results.  Refer to the "Links" section for chest pain algorithms and additional  guidance. Performed at [facility], [address] [place], [place] [address]   Type and screen [place]     Status: None   Collection Time: 02/09/21 11:54 PM  Result Value Ref Range   ABO/RH(D) A POS    Antibody Screen NEG    Sample Expiration      02/12/2021,2359 Performed at [facility], [address] [place], [place] [address]   Brain natriuretic peptide     Status: None   Collection Time: 02/09/21 11:54 PM  Result Value Ref Range   B Natriuretic  Peptide 23.9 0.0 - 100.0 pg/mL    Comment: Performed at [facility], [address] [place], [place] [address]  C-reactive protein     Status: Abnormal   Collection Time: 02/09/21 11:54 PM  Result Value Ref Range   CRP 1.1 (H) <1.0 mg/dL    Comment: Performed at [facility], [place] [address] [place], [place] [address]  D-dimer, quantitative     Status: Abnormal   Collection Time: 02/09/21 11:54 PM  Result Value Ref Range   D-Dimer, Quant 1.44 (H) 0.00 - 0.50 ug/mL-FEU    Comment: (NOTE) At the manufacturer cut-off value of 0.5 g/mL FEU, this assay has a negative predictive value of 95-100%.This assay is intended for use in conjunction with a clinical pretest probability (PTP) assessment model to exclude pulmonary embolism  (PE) and deep venous thrombosis (DVT) in outpatients suspected of PE or DVT. Results should be correlated with clinical presentation. Performed at [facility], [address] [place], [place] [address]   Ferritin     Status: None   Collection Time: 02/09/21 11:54 PM  Result Value Ref Range   Ferritin 47 11 - 307 ng/mL    Comment: Performed at [facility], [address] [place], [place] [address]  Fibrinogen     Status: None   Collection Time: 02/09/21 11:54 PM  Result Value Ref Range   Fibrinogen 420 210 - 475 mg/dL    Comment: Performed at [facility], [address] [place], [place] [address]  Hepatitis B surface antigen     Status: None   Collection Time: 02/09/21 11:54 PM  Result Value Ref Range   Hepatitis B Surface Ag NON REACTIVE NON REACTIVE    Comment: Performed at [facility], [place] [address], [place], [place] [address]  Lactate dehydrogenase     Status: None   Collection Time: 02/09/21 11:54 PM  Result Value Ref Range   LDH 159 98 - 192 U/L    Comment: Performed at [facility], [place], [place] [address]  Procalcitonin     Status: None   Collection Time: 02/09/21 11:54 PM  Result Value Ref Range   Procalcitonin <0.10 ng/mL    Comment:        Interpretation: PCT (Procalcitonin) <= 0.5 ng/mL: Systemic infection (sepsis) is not likely. Local bacterial infection is possible. (NOTE)       Sepsis PCT Algorithm           Lower Respiratory Tract                                      Infection PCT Algorithm    ----------------------------     ----------------------------         PCT < 0.25 ng/mL                PCT < 0.10 ng/mL          Strongly encourage             Strongly discourage   discontinuation of antibiotics    initiation of antibiotics    ----------------------------     -----------------------------       PCT 0.25 - 0.50 ng/mL            PCT 0.10 - 0.25 ng/mL               OR       >  80%  decrease in PCT            Discourage initiation of                                            antibiotics      Encourage discontinuation           of antibiotics    ----------------------------     -----------------------------         PCT >= 0.50 ng/mL              PCT 0.26 - 0.50 ng/mL               AND        <80% decrease in PCT             Encourage initiation of                                             antibiotics       Encourage continuation           of antibiotics    ----------------------------     -----------------------------        PCT >= 0.50 ng/mL                  PCT > 0.50 ng/mL               AND         increase in PCT                  Strongly encourage                                      initiation of antibiotics    Strongly encourage escalation           of antibiotics                                     -----------------------------                                           PCT <= 0.25 ng/mL                                                 OR                                        > 80% decrease in PCT                                      Discontinue / Do not initiate  antibiotics  Performed at [facility], [place], [place] [address]   Troponin I (High Sensitivity)     Status: None   Collection Time: 02/09/21 11:54 PM  Result Value Ref Range   Troponin I (High Sensitivity) 9 <18 ng/L    Comment: (NOTE) Elevated high sensitivity troponin I (hsTnI) values and significant  changes across serial measurements may suggest ACS but many other  chronic and acute conditions are known to elevate hsTnI results.  Refer to the "Links" section for chest pain algorithms and additional  guidance. Performed at [facility], [address] [place], [place] [address]   CBG monitoring, ED     Status: Abnormal   Collection Time: 02/10/21  1:45 AM  Result Value Ref Range    Glucose-Capillary 119 (H) 70 - 99 mg/dL    Comment: Glucose reference range applies only to samples taken after fasting for at least 8 hours.  Glucose, capillary     Status: Abnormal   Collection Time: 02/10/21  7:48 AM  Result Value Ref Range   Glucose-Capillary 109 (H) 70 - 99 mg/dL    Comment: Glucose reference range applies only to samples taken after fasting for at least 8 hours.  Urinalysis, Complete w Microscopic     Status: Abnormal   Collection Time: 02/10/21 10:49 AM  Result Value Ref Range   Color, Urine YELLOW (A) YELLOW   APPearance CLEAR (A) CLEAR   Specific Gravity, Urine 1.018 1.005 - 1.030   pH 5.0 5.0 - 8.0   Glucose, UA NEGATIVE NEGATIVE mg/dL   Hgb urine dipstick NEGATIVE NEGATIVE   Bilirubin Urine NEGATIVE NEGATIVE   Ketones, ur NEGATIVE NEGATIVE mg/dL   Protein, ur NEGATIVE NEGATIVE mg/dL   Nitrite NEGATIVE NEGATIVE   Leukocytes,Ua TRACE (A) NEGATIVE   RBC / HPF 0-5 0 - 5 RBC/hpf   WBC, UA 0-5 0 - 5 WBC/hpf   Bacteria, UA RARE (A) NONE SEEN   Squamous Epithelial / LPF 0-5 0 - 5   Hyaline Casts, UA PRESENT     Comment: Performed at [facility], [address] [place], [place] [address]  Glucose, capillary     Status: Abnormal   Collection Time: 02/10/21 12:55 PM  Result Value Ref Range   Glucose-Capillary 219 (H) 70 - 99 mg/dL    Comment: Glucose reference range applies only to samples taken after fasting for at least 8 hours.  Glucose, capillary     Status: Abnormal   Collection Time: 02/10/21  6:35 PM  Result Value Ref Range   Glucose-Capillary 104 (H) 70 - 99 mg/dL    Comment: Glucose reference range applies only to samples taken after fasting for at least 8 hours.  Glucose, capillary     Status: Abnormal   Collection Time: 02/10/21  9:43 PM  Result Value Ref Range   Glucose-Capillary 110 (H) 70 - 99 mg/dL    Comment: Glucose reference range applies only to samples taken after fasting for at least 8 hours.  Glucose, capillary     Status:  Abnormal   Collection Time: 02/11/21 10:05 AM  Result Value Ref Range   Glucose-Capillary 165 (H) 70 - 99 mg/dL    Comment: Glucose reference range applies only to samples taken after fasting for at least 8 hours.  Glucose, capillary     Status: Abnormal   Collection Time: 02/11/21 12:52 PM  Result Value Ref Range   Glucose-Capillary 155 (H) 70 - 99 mg/dL  Comment: Glucose reference range applies only to samples taken after fasting for at least 8 hours.  Iron and TIBC     Status: Abnormal   Collection Time: 02/11/21  2:41 PM  Result Value Ref Range   Iron 23 (L) 28 - 170 ug/dL   TIBC 309 250 - 450 ug/dL   Saturation Ratios 7 (L) 10.4 - 31.8 %   UIBC 286 ug/dL    Comment: Performed at [facility], [address] [place], [place] [address]  Vitamin B12     Status: None   Collection Time: 02/11/21  2:41 PM  Result Value Ref Range   Vitamin B-12 263 180 - 914 pg/mL    Comment: (NOTE) This assay is not validated for testing neonatal or myeloproliferative syndrome specimens for Vitamin B12 levels. Performed at [facility], [place] [address], [place], [place] [address]   Glucose, capillary     Status: Abnormal   Collection Time: 02/11/21  5:09 PM  Result Value Ref Range   Glucose-Capillary 113 (H) 70 - 99 mg/dL    Comment: Glucose reference range applies only to samples taken after fasting for at least 8 hours.  Glucose, capillary     Status: Abnormal   Collection Time: 02/11/21  8:38 PM  Result Value Ref Range   Glucose-Capillary 247 (H) 70 - 99 mg/dL    Comment: Glucose reference range applies only to samples taken after fasting for at least 8 hours.  CBC with Differential/Platelet     Status: Abnormal   Collection Time: 02/12/21  5:32 AM  Result Value Ref Range   WBC 12.0 (H) 4.0 - 10.5 K/uL   RBC 2.75 (L) 3.87 - 5.11 MIL/uL   Hemoglobin 8.3 (L) 12.0 - 15.0 g/dL   HCT 24.1 (L) 36.0 - 46.0 %   MCV 87.6 80.0 - 100.0 fL   MCH 30.2 26.0 - 34.0 pg   MCHC 34.4  30.0 - 36.0 g/dL   RDW 14.6 11.5 - 15.5 %   Platelets 269 150 - 400 K/uL   nRBC 0.0 0.0 - 0.2 %   Neutrophils Relative % 59 %   Neutro Abs 7.0 1.7 - 7.7 K/uL   Lymphocytes Relative 26 %   Lymphs Abs 3.2 0.7 - 4.0 K/uL   Monocytes Relative 13 %   Monocytes Absolute 1.6 (H) 0.1 - 1.0 K/uL   Eosinophils Relative 1 %   Eosinophils Absolute 0.2 0.0 - 0.5 K/uL   Basophils Relative 0 %   Basophils Absolute 0.0 0.0 - 0.1 K/uL   Immature Granulocytes 1 %   Abs Immature Granulocytes 0.09 (H) 0.00 - 0.07 K/uL    Comment: Performed at [facility], [address], [place], [place] [address]  Basic metabolic panel     Status: Abnormal   Collection Time: 02/12/21  5:32 AM  Result Value Ref Range   Sodium 131 (L) 135 - 145 mmol/L   Potassium 4.0 3.5 - 5.1 mmol/L   Chloride 97 (L) 98 - 111 mmol/L   CO2 26 22 - 32 mmol/L   Glucose, Bld 148 (H) 70 - 99 mg/dL    Comment: Glucose reference range applies only to samples taken after fasting for at least 8 hours.   BUN 27 (H) 8 - 23 mg/dL   Creatinine, Ser 1.02 (H) 0.44 - 1.00 mg/dL   Calcium 8.1 (L) 8.9 - 10.3 mg/dL   GFR, Estimated 53 (L) >60 mL/min    Comment: (NOTE) Calculated using  the CKD-EPI Creatinine Equation (2021)    Anion gap 8 5 - 15    Comment: Performed at [facility], [address] [place], [place] [address]  Magnesium     Status: None   Collection Time: 02/12/21  5:32 AM  Result Value Ref Range   Magnesium 1.7 1.7 - 2.4 mg/dL    Comment: Performed at [facility], [address] [place], [place] [address]  Homocysteine     Status: None   Collection Time: 02/12/21  5:32 AM  Result Value Ref Range   Homocysteine 9.6 0.0 - 21.3 umol/L    Comment: (NOTE) Performed At: [facility] [place], [place] [id number] Rush Farmer MD [id number]   Glucose, capillary     Status: Abnormal   Collection Time: 02/12/21  9:22 AM  Result Value Ref Range   Glucose-Capillary 338 (H)  70 - 99 mg/dL    Comment: Glucose reference range applies only to samples taken after fasting for at least 8 hours.  Glucose, capillary     Status: Abnormal   Collection Time: 02/12/21 12:10 PM  Result Value Ref Range   Glucose-Capillary 305 (H) 70 - 99 mg/dL    Comment: Glucose reference range applies only to samples taken after fasting for at least 8 hours.  Lipase, blood     Status: None   Collection Time: 04/03/21  5:26 PM  Result Value Ref Range   Lipase 30 11 - 51 U/L    Comment: Performed at [facility], [address] [place], [place] [address]  Comprehensive metabolic panel     Status: Abnormal   Collection Time: 04/03/21  5:26 PM  Result Value Ref Range   Sodium 134 (L) 135 - 145 mmol/L   Potassium 3.3 (L) 3.5 - 5.1 mmol/L   Chloride 100 98 - 111 mmol/L   CO2 28 22 - 32 mmol/L   Glucose, Bld 94 70 - 99 mg/dL    Comment: Glucose reference range applies only to samples taken after fasting for at least 8 hours.   BUN 13 8 - 23 mg/dL   Creatinine, Ser 0.89 0.44 - 1.00 mg/dL   Calcium 8.6 (L) 8.9 - 10.3 mg/dL   Total Protein 6.6 6.5 - 8.1 g/dL   Albumin 3.7 3.5 - 5.0 g/dL   AST 17 15 - 41 U/L   ALT 8 0 - 44 U/L   Alkaline Phosphatase 78 38 - 126 U/L   Total Bilirubin 0.6 0.3 - 1.2 mg/dL   GFR, Estimated >60 >60 mL/min    Comment: (NOTE) Calculated using the CKD-EPI Creatinine Equation (2021)    Anion gap 6 5 - 15    Comment: Performed at [facility], [address] [place], [place] [address]  CBC     Status: Abnormal   Collection Time: 04/03/21  5:26 PM  Result Value Ref Range   WBC 9.4 4.0 - 10.5 K/uL   RBC 3.67 (L) 3.87 - 5.11 MIL/uL   Hemoglobin 9.0 (L) 12.0 - 15.0 g/dL   HCT 29.1 (L) 36.0 - 46.0 %   MCV 79.3 (L) 80.0 - 100.0 fL   MCH 24.5 (L) 26.0 - 34.0 pg   MCHC 30.9 30.0 - 36.0 g/dL   RDW 16.5 (H) 11.5 - 15.5 %   Platelets 299 150 - 400 K/uL   nRBC 0.0 0.0 - 0.2 %    Comment: Performed at [facility], [address] [place], [place]  [address]  Troponin I (High Sensitivity)     Status: None   Collection Time: 04/03/21  7:32 PM  Result Value Ref Range   Troponin I (High Sensitivity) 10 <18 ng/L    Comment: (NOTE) Elevated high sensitivity troponin I (hsTnI) values and significant  changes across serial measurements may suggest ACS but many other  chronic and acute conditions are known to elevate hsTnI results.  Refer to the "Links" section for chest pain algorithms and additional  guidance. Performed at [facility], [place], [place] [address]   Troponin I (High Sensitivity)     Status: None   Collection Time: 04/03/21  8:34 PM  Result Value Ref Range   Troponin I (High Sensitivity) 11 <18 ng/L    Comment: (NOTE) Elevated high sensitivity troponin I (hsTnI) values and significant  changes across serial measurements may suggest ACS but many other  chronic and acute conditions are known to elevate hsTnI results.  Refer to the "Links" section for chest pain algorithms and additional  guidance. Performed at [facility], [address] [place], [place] [address]   Urinalysis, Complete w Microscopic Urine, Clean Catch     Status: Abnormal   Collection Time: 04/03/21 11:31 PM  Result Value Ref Range   Color, Urine STRAW (A) YELLOW   APPearance CLEAR (A) CLEAR   Specific Gravity, Urine 1.015 1.005 - 1.030   pH 7.0 5.0 - 8.0   Glucose, UA NEGATIVE NEGATIVE mg/dL   Hgb urine dipstick NEGATIVE NEGATIVE   Bilirubin Urine NEGATIVE NEGATIVE   Ketones, ur NEGATIVE NEGATIVE mg/dL   Protein, ur 30 (A) NEGATIVE mg/dL   Nitrite NEGATIVE NEGATIVE   Leukocytes,Ua TRACE (A) NEGATIVE   RBC / HPF 0-5 0 - 5 RBC/hpf   WBC, UA 0-5 0 - 5 WBC/hpf   Bacteria, UA NONE SEEN NONE SEEN   Squamous Epithelial / LPF 0-5 0 - 5   Mucus PRESENT     Comment: Performed at [facility], [address], [place],  [address]    Radiology CT ABDOMEN PELVIS W  CONTRAST  Result Date: 04/03/2021 CLINICAL DATA:  Abdomen pain left lower quadrant severe pain EXAM: CT ABDOMEN AND PELVIS WITH CONTRAST TECHNIQUE: Multidetector CT imaging of the abdomen and pelvis was performed using the standard protocol following bolus administration of intravenous contrast. CONTRAST:  26mL OMNIPAQUE IOHEXOL 300 MG/ML  SOLN COMPARISON:  CT 04/04/2019 FINDINGS: Lower chest: Lung bases demonstrate no acute consolidation or pleural effusion. Coronary vascular calcification. Hepatobiliary: Status post cholecystectomy. Stable mild intra and extrahepatic biliary enlargement. Pancreas: Unremarkable. No pancreatic ductal dilatation or surrounding inflammatory changes. Spleen: Normal in size without focal abnormality. Adrenals/Urinary Tract: Adrenal glands are within normal limits. Kidneys show no hydronephrosis. Subcentimeter hypodensities are too small to further characterize. The bladder is unremarkable. 8 mm slightly dense cortical lesion off the posterior mid right kidney, series 2, image 42. The bladder is unremarkable Stomach/Bowel: The stomach is nonenlarged. No dilated small bowel. No acute bowel wall thickening. Negative appendix. Vascular/Lymphatic: Advanced aortic atherosclerosis. No aneurysm. No suspicious nodes. Reproductive: Status post hysterectomy. No adnexal masses. Other: Negative for free air or free fluid. No definite ventral hernia is visualized. Musculoskeletal: Grade 1 anterolisthesis L4 on L5. Multilevel degenerative changes IMPRESSION: 1. No CT evidence for acute intra-abdominal or pelvic abnormality. 2. 8 mm slightly dense cortical lesion off the mid right kidney too small to further characterize but is increased compared to prior. Electronically Signed   By: Maudie Mercury  Francoise Ceo M.D.   On: 04/03/2021 23:32    Assessment/Plan Type 2 diabetes mellitus (HCC) blood glucose control important in reducing the progression of atherosclerotic disease. Also, involved in wound healing. On  appropriate medications.     Benign essential HTN blood pressure control important in reducing the progression of atherosclerotic disease. On appropriate oral medications.   Celiac artery stenosis (HCC) Duplex at last check shows her celiac artery to have normal velocities with good flow.  Her SMA does have some elevated velocities that would suggest a greater than 70% stenosis which is slightly increased from her study 6 months ago.  No current worrisome symptoms of chronic visceral ischemia.  No postprandial abdominal pain, food fear, or weight loss.  No intervention be planned at this time and we will continue to follow her with duplex and I will see her back in 6 months.  Atherosclerosis of native arteries of the extremities with ulceration (HCC) ABIs were actually in the normal range at 1.3 on the right and 1.1 on the left with biphasic waveforms.  Her digital pressure was normal on the right but was reduced on the left and only 40 although the waveform was still present. Her flow should be adequate for wound healing although there is definitely some small vessel disease present.  I will hold on any intervention at this time but I will plan a short interval follow-up of about 3 months with noninvasive studies.  Should her wound take a turn for the worse, I would plan an angiogram in the near future.    Leotis Pain, MD  04/07/2021 5:09 PM    This note was created with Dragon medical transcription system.  Any errors from dictation are purely unintentional

## 2021-04-08 ENCOUNTER — Ambulatory Visit
Admission: RE | Admit: 2021-04-08 | Discharge: 2021-04-08 | Disposition: A | Discharge status: Home / Self Care | Payer: Medicare Other | Source: Ambulatory Visit | Attending: Internal Medicine | Admitting: Internal Medicine

## 2021-04-08 ENCOUNTER — Encounter (HOSPITAL_BASED_OUTPATIENT_CLINIC_OR_DEPARTMENT_OTHER): Payer: Medicare Other | Admitting: Internal Medicine

## 2021-04-08 DIAGNOSIS — M7989 Other specified soft tissue disorders: Secondary | ICD-10-CM | POA: Diagnosis not present

## 2021-04-08 DIAGNOSIS — I872 Venous insufficiency (chronic) (peripheral): Secondary | ICD-10-CM | POA: Diagnosis not present

## 2021-04-08 DIAGNOSIS — S81802A Unspecified open wound, left lower leg, initial encounter: Secondary | ICD-10-CM | POA: Diagnosis not present

## 2021-04-08 DIAGNOSIS — L97322 Non-pressure chronic ulcer of left ankle with fat layer exposed: Secondary | ICD-10-CM | POA: Diagnosis not present

## 2021-04-08 DIAGNOSIS — S91012S Laceration without foreign body, left ankle, sequela: Secondary | ICD-10-CM | POA: Diagnosis not present

## 2021-04-08 DIAGNOSIS — E11621 Type 2 diabetes mellitus with foot ulcer: Secondary | ICD-10-CM | POA: Diagnosis not present

## 2021-04-08 DIAGNOSIS — I480 Paroxysmal atrial fibrillation: Secondary | ICD-10-CM | POA: Diagnosis not present

## 2021-04-08 DIAGNOSIS — L97329 Non-pressure chronic ulcer of left ankle with unspecified severity: Secondary | ICD-10-CM

## 2021-04-08 DIAGNOSIS — E1140 Type 2 diabetes mellitus with diabetic neuropathy, unspecified: Secondary | ICD-10-CM | POA: Diagnosis not present

## 2021-04-08 DIAGNOSIS — T8133XA Disruption of traumatic injury wound repair, initial encounter: Secondary | ICD-10-CM | POA: Diagnosis not present

## 2021-04-08 NOTE — Patient Instructions (Addendum)
Thank you for allowing the Chronic Care Management team to participate in your care.   Goals Addressed Patient Care Plan: Diabetes Type 2 (Adult)     Problem Identified: Disease Progression (Diabetes, Type 2)      Long-Range Goal: Disease Progression Prevented or Minimized   Start Date: 04/06/2021  Expected End Date: 07/05/2021  Priority: High  Note:   Lab Results  Component Value Date   HGBA1C 7.6 (H) 02/08/2021    Current Barriers:  Chronic disease management support and educational needs related to Diabetes self-management   Case Manager Clinical Goal(s):  Over the next 90 days, patient will demonstrate improved adherence to prescribed treatment plan for Diabetes self-management as evidenced by taking medications as prescribed, adhering to recommended ADA/carb modified diet and utilizing Dexcom/taking appropriate interventions to correct blood glucose levels when the device alarms.   Interventions:  Collaboration with Jerrol Banana., MD regarding development and update of comprehensive plan of care as evidenced by provider attestation and co-signature Inter-disciplinary care team collaboration (see longitudinal plan of care) Reviewed medications and compliance with current treatment plan. Reviewed recent readings. Reviewed s/sx of hypoglycemia nd hyperglycemia. Per daughter [name], [name] continues taking medications as prescribed. Family members continue to prepare medications weekly. Reports most of her blood glucose readings have been within range with exception of a low reading of 54 mg/dl during her recent visit to the ED. Reading today was 123 mg/dl. No concerns regarding nutritional intake. A1C is currently not at goal but Mrs. Laviolette remains mostly independent with diabetes self-management. Tammy reports that she continues to respond to [facility] alarms appropriately. Her family remains very supportive and engaged with her care. Denies need for additional  assistance with diabetes management.     Patient Goals/Self-Care Activities -Self-administer medications as prescribed -Attend all scheduled provider appointments -Continue utilizing Dexcom and take appropriate interventions when the device alarms -Adhere to prescribed ADA/carb modified -Notify provider or care management team with questions and new concerns as needed   Follow Up Plan:  Will follow up next month         Patient Care Plan: Fall Risk (Adult)     Problem Identified: Fall Risk      Long-Range Goal: Maintain Function /Absence of Fall and Fall-Related Injury   Start Date: 04/06/2021  Expected End Date: 07/05/2021  Priority: High  Note:   Current Barriers:  Fall Risk r/t Impaired Vision and Impaired Gait  Clinical Goal(s):  Over the next 120 days, patient will not experience falls or require emergent care d/t fall related injuries.  Interventions:  Collaboration with Jerrol Banana., MD regarding development and update of comprehensive plan of care as evidenced by provider attestation and co-signature. Inter-disciplinary care team collaboration (see longitudinal plan of care) Reviewed safety and fall prevention measures.  Tammy reports no falls since her last hospitalization. She continues using her walker. Tammy reports the stitches to her left foot have been removed but the wound has not healed. She is being followed by the Wound Clinic. Also notes that the pulse to the top of her left foot was very difficult to detect during a recent provider appointment. She has a history of stent placement. She will follow up with the Vascular team on 04/07/21.     Self-Care Deficits/Patient Goals:  -Utilize assistive device appropriately with all ambulation -Change positions slowly and wear non skid footwear when ambulating -Complete outreach with the Vascular team as scheduled on 04/08/11. -Continue weekly outreach with the Wound  Clinic as scheduled -Contact  provider or the care management team with questions or new concerns   Follow Up Plan Will follow up next month    Patient Care Plan: Wound to Left Foot     Problem Identified: Infection      Goal: Infection Prevented or Managed   Start Date: 04/06/2021  Expected End Date: 05/06/2021  Priority: High  Note:   Current Barriers:  Care Management support and educational needs related to left foot wound.  Clinical Goal(s):  Over the next 30 days, patient will not require hospitalizations for complications r/t wound infection.  Interventions:  Collaboration with Jerrol Banana., MD regarding development and update of comprehensive plan of care as evidenced by provider attestation and co-signature Inter-disciplinary care team collaboration (see longitudinal plan of care) Discussed current treatment plan for wound care. Tammy reports that Mrs. Raske currently requires weekly visits to the Wound Clinic for wound cleaning. She requires daily dressing changes. Dressings are currently being changed once a week by the Wound Care staff and three times a week by the [facility] team. Tammy confirmed that the family completes dressing changes on the remaining days. Discussed risk for delayed wound healing d/t diabetes. Thoroughly reviewed s/sx of wound infection and indications for immediately notifying the Wound Clinic. Advised to seek medical attention if worsening changes are noted after business hours.    Self Care Activities/Patient Goals: Self administer medications as prescribed Take antibiotics when prescribed and complete course as instructed Attend weekly visits with the Wound Care clinic as scheduled Continue to monitor wound and reports s/sx of infection Calls provider office for questions or new concerns   Follow Up Plan:  Will follow up next month         Mrs. Garabedian's daughter/caregiver Tammy verbalized understanding of the information discussed  during the telephonic outreach today. Declined need for mailed/printed resources. A member of the care management team will follow up next month.    [facility] [phone]

## 2021-04-08 NOTE — Progress Notes (Addendum)
NYAISHA, [name] ([id number]) Visit Report for 04/08/2021 Arrival Information Details Patient Name: [name], [name]. Date of Service: 04/08/2021 9:15 AM Medical Record Number: [id number] Patient Account Number: [id number] Date of Birth/Sex: [birth year] (87 y.o. Female) Treating RN: [name] Primary Care [name]: [name] Other Clinician: Referring [name]: [name] Treating [name]/Extender: [name] in Treatment: 1 Visit Information History Since Last Visit Has Dressing in Place as Prescribed: Yes Patient Arrived: Walker Pain Present Now: No Arrival Time: 09:21 Accompanied By: daughter in law Transfer Assistance: None Patient Identification Verified: Yes Secondary Verification Process Completed: Yes Patient Has Alerts: Yes Patient Alerts: Patient on Blood Thinner ***PLAVIX*** ALLERGIC TO LIDOCAINE Electronic Signature(s) Signed: 04/08/2021 4:41:30 PM By: Tammy Amen RN Entered By: [name] on 04/08/2021 09:21:58 [name] ([id number]) -------------------------------------------------------------------------------- Clinic Level of Care Assessment Details Patient Name: [name], [name] Date of Service: 04/08/2021 9:15 AM Medical Record Number: [id number] Patient Account Number: [id number] Date of Birth/Sex: Mar 06, 1929 (87 y.o. Female) Treating RN: [name] Primary Care Nylia Gavina: [name] Other Clinician: Referring Vahan Wadsworth: [name] Treating Christabel Camire/Extender: [name] in Treatment: 1 Clinic Level of Care Assessment Items TOOL 1 Quantity Score []  - Use when EandM and Procedure is performed on INITIAL visit 0 ASSESSMENTS - Nursing Assessment / Reassessment []  - General Physical Exam (combine w/ comprehensive assessment (listed just below) when performed on new 0 pt. evals) []  - 0 Comprehensive Assessment (HX, ROS, Risk Assessments, Wounds Hx, etc.) ASSESSMENTS -  Wound and Skin Assessment / Reassessment []  - Dermatologic / Skin Assessment (not related to wound area) 0 ASSESSMENTS - Ostomy and/or Continence Assessment and Care []  - Incontinence Assessment and Management 0 []  - 0 Ostomy Care Assessment and Management (repouching, etc.) PROCESS - Coordination of Care []  - Simple Patient / Family Education for ongoing care 0 []  - 0 Complex (extensive) Patient / Family Education for ongoing care []  - 0 Staff obtains Programmer, systems, Records, Test Results / Process Orders []  - 0 Staff telephones HHA, Nursing Homes / Clarify orders / etc []  - 0 Routine Transfer to another Facility (non-emergent condition) []  - 0 Routine Hospital Admission (non-emergent condition) []  - 0 New Admissions / Biomedical engineer / Ordering NPWT, Apligraf, etc. []  - 0 Emergency Hospital Admission (emergent condition) PROCESS - Special Needs []  - Pediatric / Minor Patient Management 0 []  - 0 Isolation Patient Management []  - 0 Hearing / Language / Visual special needs []  - 0 Assessment of Community assistance (transportation, D/C planning, etc.) []  - 0 Additional assistance / Altered mentation []  - 0 Support Surface(s) Assessment (bed, cushion, seat, etc.) INTERVENTIONS - Miscellaneous []  - External ear exam 0 []  - 0 Patient Transfer (multiple staff / Civil Service fast streamer / Similar devices) []  - 0 Simple Staple / Suture removal (25 or less) []  - 0 Complex Staple / Suture removal (26 or more) []  - 0 Hypo/Hyperglycemic Management (do not check if billed separately) []  - 0 Ankle / Brachial Index (ABI) - do not check if billed separately Has the patient been seen at the hospital within the last three years: Yes Total Score: 0 Level Of Care: ____ [name] ([id number]) Electronic Signature(s) Signed: 04/08/2021 4:41:30 PM By: Tammy Amen RN Entered By: [name] on 04/08/2021 10:15:03 Mccarry, [name]  ([id number]) -------------------------------------------------------------------------------- Encounter Discharge Information Details Patient Name: [name], [name] Date of Service: 04/08/2021 9:15 AM Medical Record Number: [id number] Patient Account Number: [id number] Date of Birth/Sex: 03-17-1929 (87 y.o. Female) Treating RN:  [name] Primary Care Marcin Holte: [name] Other Clinician: Referring Charmane [name]: [name] Treating [name]/Extender: [name] in Treatment: 1 Encounter Discharge Information Items Post Procedure Vitals Discharge Condition: Stable Temperature (F): 98.4 Ambulatory Status: Walker Pulse (bpm): 93 Discharge Destination: Home Respiratory Rate (breaths/min): 18 Transportation: Private Auto Blood Pressure (mmHg): 169/71 Accompanied By: daughter in law Schedule Follow-up Appointment: Yes Clinical Summary of Care: Electronic Signature(s) Signed: 04/08/2021 4:41:30 PM By: Tammy Amen RN Entered By: [name] on 04/08/2021 10:24:10 [name], [name] -------------------------------------------------------------------------------- Lower Extremity Assessment Details Patient Name: [name], [name] Date of Service: 04/08/2021 9:15 AM Medical Record Number: [id number] Patient Account Number: [id number] Date of Birth/Sex: 07-06-1929 (87 y.o. Female) Treating RN: [name] Primary Care Rosali Augello: [name] Other Clinician: Referring Aveer Bartow: [name] Treating Timo Hartwig/Extender: [name] in Treatment: 1 Edema Assessment Assessed: [Left: Yes] [Right: No] Edema: [Left: N] [Right: o] Vascular Assessment Pulses: Dorsalis Pedis Palpable: [Left:No Yes] Electronic Signature(s) Signed: 04/08/2021 4:41:30 PM By: Tammy Amen RN Entered By: [name] on 04/08/2021 09:35:47 [name], [name] -------------------------------------------------------------------------------- Multi Wound Chart Details Patient Name: [name], [name] Date of Service: 04/08/2021 9:15 AM Medical Record Number: [id number] Patient Account Number: [id number] Date of Birth/Sex: March 03, 1929 (87 y.o. Female) Treating RN: [name] Primary Care Malillany Kazlauskas: [name] Other Clinician: Referring Jona Erkkila: [name] Treating Kollyns Mickelson/Extender: [name] in Treatment: 1 Vital Signs Height(in): 29 Pulse(bpm): 73 Weight(lbs): Blood Pressure(mmHg): 169/71 Body Mass Index(BMI): Temperature(F): 98.4 Respiratory Rate(breaths/min): 18 Photos: [N/A:N/A] Wound Location: Left, Lateral Malleolus Left Ankle N/A Wounding Event: Surgical Injury Skin Tear/Laceration N/A Primary Etiology: Dehisced Wound Abrasion N/A Comorbid History: Chronic Obstructive Pulmonary Chronic Obstructive Pulmonary N/A Disease (COPD), Arrhythmia, Disease (COPD), Arrhythmia, Coronary Artery Disease, Coronary Artery Disease, Hypertension, Type II Diabetes, Hypertension, Type II Diabetes, Osteoarthritis, Neuropathy Osteoarthritis, Neuropathy Date Acquired: 02/08/2021 02/08/2021 N/A [name] of Treatment: 1 1 N/A Wound Status: Open Healed - Epithelialized N/A Measurements L x W x D (cm) 0.8x1x0.6 0x0x0 N/A Area (cm) : 0.628 0 N/A Volume (cm) : 0.377 0 N/A % Reduction in Area: 33.30% 100.00% N/A % Reduction in Volume: 50.00% 100.00% N/A Classification: Full Thickness Without Exposed Full Thickness Without Exposed N/A Support Structures Support Structures Exudate Amount: Medium None Present N/A Exudate Type: Serosanguineous N/A N/A Exudate Color: red, brown N/A N/A Granulation Amount: Small (1-33%) None Present (0%) N/A Granulation Quality: Red N/A N/A Necrotic Amount: Large (67-100%) None Present (0%) N/A Exposed Structures: Fat Layer (Subcutaneous Tissue): Fascia: No N/A Yes Fat Layer  (Subcutaneous Tissue): Fascia: No No Tendon: No Tendon: No Muscle: No Muscle: No Joint: No Joint: No Bone: No Bone: No Epithelialization: None Large (67-100%) N/A Debridement: Debridement - Excisional N/A N/A Pre-procedure Verification/Time 10:10 N/A N/A Out Taken: Tissue Debrided: Subcutaneous, Slough N/A N/A Level: Skin/Subcutaneous Tissue N/A N/A Debridement Area (sq cm): 0.8 N/A N/A Instrument: Curette N/A N/A Bleeding: Minimum N/A N/A Hemostasis Achieved: Pressure N/A N/A Debridement Treatment Procedure was tolerated well N/A N/A Response: Banker, Yesennia L. ([id number]) Post Debridement 0.8x1x0.7 N/A N/A Measurements L x W x D (cm) Post Debridement Volume: 0.44 N/A N/A (cm) Procedures Performed: Debridement N/A N/A Treatment Notes Wound #1 (Malleolus) Wound Laterality: Left, Lateral Cleanser Normal Saline Discharge Instruction: Wash your hands with soap and water. Remove old dressing, discard into plastic bag and place into trash. Cleanse the wound with Normal Saline prior to applying a clean dressing using gauze sponges, not tissues or cotton balls. Do not scrub or use excessive  force. Pat dry using gauze sponges, not tissue or cotton balls. Peri-Wound Care Topical Santyl Collagenase Ointment, 30 (gm), tube Discharge Instruction: Apply nickel thick to wound bed only Primary Dressing Gauze Discharge Instruction: Apply moist gauze to cover over santyl Secondary Dressing ABD Pad 5x9 (in/in) Discharge Instruction: Cover dressing Kerlix 4.5 x 4.1 (in/yd) Discharge Instruction: Apply Kerlix 4.5 x 4.1 (in/yd) as instructed Secured With [place] Surgical Tape, 2x2 (in/yd) Discharge Instruction: Secure kerlix Compression Wrap Compression Stockings Add-Ons Electronic Signature(s) Signed: 04/08/2021 11:16:31 AM By: Tammy Shan DO Entered By: Tammy Shan on 04/08/2021 11:08:44 Blankley, [name]  ([id number]) -------------------------------------------------------------------------------- [place] Details Patient Name: [name], [name] Date of Service: 04/08/2021 9:15 AM Medical Record Number: [id number] Patient Account Number: [id number] Date of Birth/Sex: September 26, 1929 (87 y.o. Female) Treating RN: [name] Primary Care Josephus Harriger: [name] Other Clinician: Referring Kylinn Shropshire: [name] Treating Aceton Kinnear/Extender: [name] in Treatment: 1 Active Inactive Wound/Skin Impairment Nursing Diagnoses: Impaired tissue integrity Goals: Patient/caregiver will verbalize understanding of skin care regimen Date Initiated: 04/01/2021 Target Resolution Date: 04/01/2021 Goal Status: Active Ulcer/skin breakdown will have a volume reduction of 30% by week 4 Date Initiated: 04/01/2021 Target Resolution Date: 05/02/2021 Goal Status: Active Ulcer/skin breakdown will have a volume reduction of 50% by week 8 Date Initiated: 04/01/2021 Target Resolution Date: 06/02/2021 Goal Status: Active Ulcer/skin breakdown will have a volume reduction of 80% by week 12 Date Initiated: 04/01/2021 Target Resolution Date: 07/02/2021 Goal Status: Active Ulcer/skin breakdown will heal within 14 [name] Date Initiated: 04/01/2021 Target Resolution Date: 08/02/2021 Goal Status: Active Interventions: Assess patient/caregiver ability to obtain necessary supplies Assess patient/caregiver ability to perform ulcer/skin care regimen upon admission and as needed Assess ulceration(s) every visit Provide education on ulcer and skin care Treatment Activities: Patient referred to home care : 04/01/2021 Skin care regimen initiated : 04/01/2021 Notes: Electronic Signature(s) Signed: 04/08/2021 4:41:30 PM By: Tammy Amen RN Entered By: [name] on 04/08/2021 09:35:57 Schubring, Red[place].  ([id number]) -------------------------------------------------------------------------------- Pain Assessment Details Patient Name: [name], [name] Date of Service: 04/08/2021 9:15 AM Medical Record Number: [id number] Patient Account Number: [id number] Date of Birth/Sex: [birth year] (87 y.o. Female) Treating RN: [name] Primary Care Nadezhda Pollitt: [name] Other Clinician: Referring Tacoya Altizer: [name] Treating Jerricka Carvey/Extender: [name] in Treatment: 1 Active Problems Location of Pain Severity and Description of Pain Patient Has Paino No Site Locations Rate the pain. Current Pain Level: 0 Pain Management and Medication Current Pain Management: Electronic Signature(s) Signed: 04/08/2021 4:41:30 PM By: Tammy Amen RN Entered By: [name] on 04/08/2021 09:24:07 [name] ([id number]) -------------------------------------------------------------------------------- Patient/Caregiver Education Details Patient Name: [name], Jillianne L. Date of Service: 04/08/2021 9:15 AM Medical Record Number: [id number] Patient Account Number: [id number] Date of Birth/Gender: Jan 03, 1929 (87 y.o. Female) Treating RN: [name] Primary Care Physician: [name] Other Clinician: Referring Physician: Miguel [name] Treating Physician/Extender: [name] in Treatment: 1 Education Assessment Education Provided To: Patient Education Topics Provided Wound/Skin Impairment: Methods: Explain/Verbal Responses: State content correctly Electronic Signature(s) Signed: 04/08/2021 4:41:30 PM By: Tammy Amen RN Entered By: [name] on 04/08/2021 10:15:16 [name], [name] ([id number]) -------------------------------------------------------------------------------- Wound Assessment Details Patient Name: [name], [name] Date of Service: 04/08/2021 9:15 AM Medical Record Number: [id number] Patient Account  Number: [id number] Date of Birth/Sex: 01-08-1929 (87 y.o. Female) Treating RN: [name] Primary Care Adiva Boettner: [name] Other Clinician: Referring Kenechukwu Eckstein: [name] Treating Eydan Chianese/Extender: [name] in Treatment: 1 Wound Status  Wound Number: 1 Primary Dehisced Wound Etiology: Wound Location: Left, Lateral Malleolus Wound Open Wounding Event: Surgical Injury Status: Date Acquired: 02/08/2021 Comorbid Chronic Obstructive Pulmonary Disease (COPD), [name] Of Treatment: 1 History: Arrhythmia, Coronary Artery Disease, Hypertension, Type Clustered Wound: No II Diabetes, Osteoarthritis, Neuropathy Photos Wound Measurements Length: (cm) 0.8 Width: (cm) 1 Depth: (cm) 0.6 Area: (cm) 0.628 Volume: (cm) 0.377 % Reduction in Area: 33.3% % Reduction in Volume: 50% Epithelialization: None Tunneling: No Undermining: No Wound Description Classification: Full Thickness Without Exposed Support Structures Exudate Amount: Medium Exudate Type: Serosanguineous Exudate Color: red, brown Foul Odor After Cleansing: No Slough/Fibrino Yes Wound Bed Granulation Amount: Small (1-33%) Exposed Structure Granulation Quality: Red Fascia Exposed: No Necrotic Amount: Large (67-100%) Fat Layer (Subcutaneous Tissue) Exposed: Yes Necrotic Quality: Adherent Slough Tendon Exposed: No Muscle Exposed: No Joint Exposed: No Bone Exposed: No Treatment Notes Wound #1 (Malleolus) Wound Laterality: Left, Lateral Cleanser Normal Saline Discharge Instruction: Wash your hands with soap and water. Remove old dressing, discard into plastic bag and place into trash. Cleanse the wound with Normal Saline prior to applying a clean dressing using gauze sponges, not tissues or cotton balls. Do not scrub or use excessive force. Pat dry using gauze sponges, not tissue or cotton balls. TAMETRA, AHART ([id number]) Peri-Wound Care Topical Santyl Collagenase Ointment, 30 (gm),  tube Discharge Instruction: Apply nickel thick to wound bed only Primary Dressing Gauze Discharge Instruction: Apply moist gauze to cover over santyl Secondary Dressing ABD Pad 5x9 (in/in) Discharge Instruction: Cover dressing Kerlix 4.5 x 4.1 (in/yd) Discharge Instruction: Apply Kerlix 4.5 x 4.1 (in/yd) as instructed Secured With [place] Surgical Tape, 2x2 (in/yd) Discharge Instruction: Secure kerlix Compression Wrap Compression Stockings Add-Ons Electronic Signature(s) Signed: 04/08/2021 4:41:30 PM By: Tammy Amen RN Entered By: [name] on 04/08/2021 09:31:36 [name], [name] ([id number]) -------------------------------------------------------------------------------- Wound Assessment Details Patient Name: Hogland, Kyndahl L. Date of Service: 04/08/2021 9:15 AM Medical Record Number: [id number] Patient Account Number: [id number] Date of Birth/Sex: [birth year] (87 y.o. Female) Treating RN: [name] Primary Care Shannette Tabares: [name] Other Clinician: Referring Cesar Alf: [name] Treating Danyeal Akens/Extender: [name] in Treatment: 1 Wound Status Wound Number: 2 Primary Abrasion Etiology: Wound Location: Left Ankle Wound Healed - Epithelialized Wounding Event: Skin Tear/Laceration Status: Date Acquired: 02/08/2021 Comorbid Chronic Obstructive Pulmonary Disease (COPD), [name] Of Treatment: 1 History: Arrhythmia, Coronary Artery Disease, Hypertension, Type Clustered Wound: No II Diabetes, Osteoarthritis, Neuropathy Photos Wound Measurements Length: (cm) 0 Width: (cm) 0 Depth: (cm) 0 Area: (cm) Volume: (cm) % Reduction in Area: 100% % Reduction in Volume: 100% Epithelialization: Large (67-100%) 0 Tunneling: No 0 Undermining: No Wound Description Classification: Full Thickness Without Exposed Support Structures Exudate Amount: None Present Foul Odor After Cleansing: No Slough/Fibrino No Wound  Bed Granulation Amount: None Present (0%) Exposed Structure Necrotic Amount: None Present (0%) Fascia Exposed: No Fat Layer (Subcutaneous Tissue) Exposed: No Tendon Exposed: No Muscle Exposed: No Joint Exposed: No Bone Exposed: No Electronic Signature(s) Signed: 04/08/2021 4:41:30 PM By: Tammy Amen RN Entered By: [name] on 04/08/2021 10:14:10 Cassell, Hudson ([id number]) -------------------------------------------------------------------------------- Vitals Details Patient Name: [name], Vernecia L. Date of Service: 04/08/2021 9:15 AM Medical Record Number: [id number] Patient Account Number: [id number] Date of Birth/Sex: April 16, 1929 (87 y.o. Female) Treating RN: [name] Primary Care Asani Mcburney: [name] Other Clinician: Referring Kenly Xiao: [name] Treating Shadeed Colberg/Extender: [name] in Treatment: 1 Vital Signs Time Taken: 09:22 Temperature (F): 98.4 Height (in): 62 Pulse (bpm): 93 Respiratory  Rate (breaths/min): 18 Blood Pressure (mmHg): 169/71 Reference Range: 80 - 120 mg / dl Electronic Signature(s) Signed: 04/08/2021 4:41:30 PM By: Tammy Amen RN Entered By: [name] on 04/08/2021 09:23:46

## 2021-04-08 NOTE — Progress Notes (Addendum)
[name] ([id number]) Visit Report for 04/08/2021 Chief Complaint Document Details Patient Name: [name], [name] Date of Service: 04/08/2021 9:15 AM Medical Record Number: [id number] Patient Account Number: [id number] Date of Birth/Sex: 1929-03-05 (87 y.o. Female) Treating RN: Dolan Amen Primary Care Provider: Miguel Aschoff Other Clinician: Referring Provider: Miguel Aschoff Treating Provider/Extender: Yaakov Guthrie in Treatment: 1 Information Obtained from: Patient Chief Complaint Left ankle wounds Electronic Signature(s) Signed: 04/08/2021 11:16:31 AM By: Kalman Shan DO Entered By: Kalman Shan on 04/08/2021 11:08:55 [place], [place] ([id number]) -------------------------------------------------------------------------------- Debridement Details Patient Name: [name], [name] Date of Service: 04/08/2021 9:15 AM Medical Record Number: [id number] Patient Account Number: [id number] Date of Birth/Sex: 1929/07/15 (87 y.o. Female) Treating RN: Dolan Amen Primary Care Provider: Miguel Aschoff Other Clinician: Referring Provider: Miguel Aschoff Treating Provider/Extender: Yaakov Guthrie in Treatment: 1 Debridement Performed for Wound #1 Left,Lateral Malleolus Assessment: Performed By: Physician Kalman Shan, MD Debridement Type: Debridement Level of Consciousness (Pre- Awake and Alert procedure): Pre-procedure Verification/Time Out Yes - 10:10 Taken: Total Area Debrided (L x W): 0.8 (cm) x 1 (cm) = 0.8 (cm) Tissue and other material Viable, Non-Viable, Slough, Subcutaneous, Slough debrided: Level: Skin/Subcutaneous Tissue Debridement Description: Excisional Instrument: Curette Bleeding: Minimum Hemostasis Achieved: Pressure Response to Treatment: Procedure was tolerated well Level of Consciousness (Post- Awake and Alert procedure): Post Debridement Measurements of Total Wound Length: (cm)  0.8 Width: (cm) 1 Depth: (cm) 0.7 Volume: (cm) 0.44 Character of Wound/Ulcer Post Debridement: Stable Post Procedure Diagnosis Same as Pre-procedure Electronic Signature(s) Signed: 04/08/2021 11:16:31 AM By: Kalman Shan DO Signed: 04/08/2021 4:41:30 PM By: Dolan Amen RN Entered By: Dolan Amen on 04/08/2021 10:13:47 Dunkleberger, Halyn L. ([id number]) -------------------------------------------------------------------------------- HPI Details Patient Name: [name], [name] Date of Service: 04/08/2021 9:15 AM Medical Record Number: [id number] Patient Account Number: [id number] Date of Birth/Sex: September 29, 1928 (87 y.o. Female) Treating RN: Dolan Amen Primary Care Provider: Miguel Aschoff Other Clinician: Referring Provider: Miguel Aschoff Treating Provider/Extender: Yaakov Guthrie in Treatment: 1 History of Present Illness HPI Description: Admission 7/6 [name] is a 87 year old female with a past medical history of paroxysmal A. fib not anticoagulated and type 2 diabetes on insulin that presents to the clinic for a 1-22-month history of nonhealing wound to her left ankle. She states she tripped and fell while trying to pick up some food her [name] left on her porch. She ended up cutting her left ankle. She went to that ED and had sutures placed. Unfortunately they did not stay in place and the wound dehisced. She has been using PolyMem silver to the wound. She currently denies signs of infection. 7/13; patient presents for 1 week follow-up. She has been using Santyl to the surgical wound site and silver alginate to the abrasion. The abrasion site has healed. She has no complaints today. She followed up with vein and vascular and had ABIs done. She states that the doctor mentioned that she has enough blood flow for wound healing. She denies signs of infection today. Electronic Signature(s) Signed: 04/08/2021 11:16:31 AM By: Kalman Shan  DO Entered By: Kalman Shan on 04/08/2021 11:10:06 Stelzner, Nathanial Rancher ([id number]) -------------------------------------------------------------------------------- Physical Exam Details Patient Name: [name], [name] Date of Service: 04/08/2021 9:15 AM Medical Record Number: [id number] Patient Account Number: [id number] Date of Birth/Sex: 09-10-1929 (87 y.o. Female) Treating RN: Dolan Amen Primary Care Provider: Miguel Aschoff Other Clinician: Referring Provider: Miguel Aschoff Treating Provider/Extender: Yaakov Guthrie in Treatment: 1 Constitutional . Psychiatric . Notes Left ankle: Over the lateral  malleolus there is an elongated wound with nonviable tissue and now granulation tissue present. The small area of skin breakdown posterior to this has epithelialized. There were no signs of infection. Electronic Signature(s) Signed: 04/08/2021 11:16:31 AM By: Kalman Shan DO Entered By: Kalman Shan on 04/08/2021 11:10:59 Rumsey, Nathanial Rancher ([id number]) -------------------------------------------------------------------------------- Physician Orders Details Patient Name: [name], [name] Date of Service: 04/08/2021 9:15 AM Medical Record Number: [id number] Patient Account Number: [id number] Date of Birth/Sex: [birth year] (87 y.o. Female) Treating RN: Dolan Amen Primary Care Provider: Miguel Aschoff Other Clinician: Referring Provider: Miguel Aschoff Treating Provider/Extender: Yaakov Guthrie in Treatment: 1 Verbal / Phone Orders: No Diagnosis Coding Follow-up Appointments o Return Appointment in 1 week. o Nurse Visit as needed [place]: - [facility] for [facility] care. May utilize formulary equivalent dressing for wound treatment orders unless otherwise specified. Home Health Nurse may visit PRN to address patientos wound care needs. o Scheduled days for dressing changes  to be completed; exception, patient has scheduled wound care visit that day. Bathing/ Shower/ Hygiene o Clean wound with Normal Saline or wound cleanser. o No tub bath. Wound Treatment Wound #1 - Malleolus Wound Laterality: Left, Lateral Cleanser: Normal Saline 1 x Per Day/30 Days Discharge Instructions: Wash your hands with soap and water. Remove old dressing, discard into plastic bag and place into trash. Cleanse the wound with Normal Saline prior to applying a clean dressing using gauze sponges, not tissues or cotton balls. Do not scrub or use excessive force. Pat dry using gauze sponges, not tissue or cotton balls. Topical: Santyl Collagenase Ointment, 30 (gm), tube 1 x Per Day/30 Days Discharge Instructions: Apply nickel thick to wound bed only Primary Dressing: Gauze 1 x Per Day/30 Days Discharge Instructions: Apply moist gauze to cover over santyl Secondary Dressing: ABD Pad 5x9 (in/in) 1 x Per Day/30 Days Discharge Instructions: Cover dressing Secondary Dressing: Kerlix 4.5 x 4.1 (in/yd) 1 x Per Day/30 Days Discharge Instructions: Apply Kerlix 4.5 x 4.1 (in/yd) as instructed Secured With: 17M Medipore H Soft Cloth Surgical Tape, 2x2 (in/yd) 1 x Per Day/30 Days Discharge Instructions: Secure kerlix Electronic Signature(s) Signed: 04/08/2021 11:16:31 AM By: Kalman Shan DO Signed: 04/08/2021 4:41:30 PM By: Dolan Amen RN Entered By: Dolan Amen on 04/08/2021 10:14:57 Chevere, Charika L. ([id number]) -------------------------------------------------------------------------------- Problem List Details Patient Name: Demicco, Calyn L. Date of Service: 04/08/2021 9:15 AM Medical Record Number: [id number] Patient Account Number: [id number] Date of Birth/Sex: 11-21-1928 (87 y.o. 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 ([id number]) -------------------------------------------------------------------------------- Progress Note Details Patient Name: Deharo, Aspin L. Date of Service: 04/08/2021 9:15 AM Medical Record Number: [id number] Patient Account Number: [id number] Date of Birth/Sex: 10/14/1928 (87 y.o. Female) Treating RN: Dolan Amen Primary Care Provider: Miguel Aschoff Other Clinician: Referring Provider: Miguel Aschoff Treating Provider/Extender: Yaakov Guthrie in Treatment: 1 Subjective Chief Complaint Information obtained from Patient Left ankle wounds History of Present Illness (HPI) Admission 7/6 Ms. Jamylah Marinaccio is a 87 year old female with a past medical history of paroxysmal A. fib  not anticoagulated and type 2 diabetes on insulin that presents to the clinic for a 1-68-month history of nonhealing wound to her left ankle. She states she tripped and fell while trying to pick up some food her [name] left on her porch. She ended up cutting her left ankle. She went to that ED and had sutures placed. Unfortunately they did not stay in place and the wound dehisced. She has been using PolyMem silver to the wound. She currently denies signs of infection. 7/13; patient presents for 1 week follow-up. She has been using Santyl to the surgical wound site and silver alginate to the abrasion. The abrasion site has healed. She has no complaints today. She followed up with vein and vascular and had ABIs done. She states that the doctor mentioned  that she has enough blood flow for wound healing. She denies signs of infection today. Patient History Information obtained from Patient. Social History Never smoker. Medical History Eyes Denies history of Cataracts, Glaucoma, Optic Neuritis Ear/Nose/Mouth/Throat Denies history of Chronic sinus problems/congestion, Middle ear problems Hematologic/Lymphatic Denies history of Anemia, Hemophilia, Human Immunodeficiency Virus, Lymphedema, Sickle Cell Disease Respiratory Patient has history of Chronic Obstructive Pulmonary Disease (COPD) Cardiovascular Patient has history of Arrhythmia - Afib, Coronary Artery Disease, Hypertension Gastrointestinal Denies history of Cirrhosis , Colitis, Crohn s, Hepatitis A, Hepatitis B, Hepatitis C Endocrine Patient has history of Type II Diabetes Denies history of Type I Diabetes Genitourinary Denies history of End Stage Renal Disease Immunological Denies history of Lupus Erythematosus, Raynaud s, Scleroderma Integumentary (Skin) Denies history of History of Burn, History of pressure wounds Musculoskeletal Patient has history of Osteoarthritis Denies history of Gout, Rheumatoid Arthritis, Osteomyelitis Neurologic Patient has history of Neuropathy Denies history of Dementia, Quadriplegia, Paraplegia, Seizure Disorder Oncologic Denies history of Received Chemotherapy, Received Radiation Moes, Shakerria L. ([id number]) Objective Constitutional Vitals Time Taken: 9:22 AM, Height: 62 in, Temperature: 98.4 F, Pulse: 93 bpm, Respiratory Rate: 18 breaths/min, Blood Pressure: 169/71 mmHg. General Notes: Left ankle: Over the lateral malleolus there is an elongated wound with nonviable tissue and now granulation tissue present. The small area of skin breakdown posterior to this has epithelialized. There were no signs of infection. Integumentary (Hair, Skin) Wound #1 status is Open. Original cause of wound was Surgical Injury. The date acquired was:  02/08/2021. The wound has been in treatment 1 weeks. The wound is located on the Left,Lateral Malleolus. The wound measures 0.8cm length x 1cm width x 0.6cm depth; 0.628cm^2 area and 0.377cm^3 volume. There is Fat Layer (Subcutaneous Tissue) exposed. There is no tunneling or undermining noted. There is a medium amount of serosanguineous drainage noted. There is small (1-33%) red granulation within the wound bed. There is a large (67-100%) amount of necrotic tissue within the wound bed including Adherent Slough. Wound #2 status is Healed - Epithelialized. Original cause of wound was Skin Tear/Laceration. The date acquired was: 02/08/2021. The wound has been in treatment 1 weeks. The wound is located on the Left Ankle. The wound measures 0cm length x 0cm width x 0cm depth; 0cm^2 area and 0cm^3 volume. There is no tunneling or undermining noted. There is a none present amount of drainage noted. There is no granulation within the wound bed. There is no necrotic tissue within the wound bed. 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 There are no signs of infection. I debrided nonviable tissue.  I recommended she continue Santyl to this. Her abrasion is now healed. I also recommended obtaining the x-ray placed at last clinic visit. She saw Dr. Lucky Cowboy to on the unit 7/12 with vein and vascular. Her ABIs were 1.3 on the right and 1.1 on the left. Her digital pressure on the left was 40 and this is reduced. He states that she should have adequate blood flow for wound healing. But we will plan for an angiogram if her wound worsens. Procedures Wound #1 Pre-procedure diagnosis of Wound #1 is a Dehisced Wound located on the Left,Lateral Malleolus . There was a Excisional Skin/Subcutaneous Tissue Debridement with a total area of 0.8 sq cm  performed by Kalman Shan, MD. With the following instrument(s): Curette to remove Viable and Non-Viable tissue/material. Material removed includes Subcutaneous Tissue and Slough and. A time out was conducted at 10:10, prior to the start of the procedure. A Minimum amount of bleeding was controlled with Pressure. The procedure was tolerated well. Post Debridement Measurements: 0.8cm length x 1cm width x 0.7cm depth; 0.44cm^3 volume. Character of Wound/Ulcer Post Debridement is stable. Post procedure Diagnosis Wound #1: Same as Pre-Procedure Plan Follow-up Appointments: Return Appointment in 1 week. Nurse Visit as needed Home Health: [place]: - [facility]A, SM[place] ([id number]) [place] for wound care. May utilize formulary equivalent dressing for wound treatment orders unless otherwise specified. Home Health Nurse may visit PRN to address patient s wound care needs. Scheduled days for dressing changes to be completed; exception, patient has scheduled wound care visit that day. Bathing/ Shower/ Hygiene: Clean wound with Normal Saline or wound cleanser. No tub bath. WOUND #1: - Malleolus Wound Laterality: Left, Lateral Cleanser: Normal Saline 1 x Per Day/30 Days Discharge Instructions: Wash your hands with soap and water. Remove old dressing, discard into plastic bag and place into trash. Cleanse the wound with Normal Saline prior to applying a clean dressing using gauze sponges, not tissues or cotton balls. Do not scrub or use excessive force. Pat dry using gauze sponges, not tissue or cotton balls. Topical: Santyl Collagenase Ointment, 30 (gm), tube 1 x Per Day/30 Days Discharge Instructions: Apply nickel thick to wound bed only Primary Dressing: Gauze 1 x Per Day/30 Days Discharge Instructions: Apply moist gauze to cover over santyl Secondary Dressing: ABD Pad 5x9 (in/in) 1 x Per Day/30 Days Discharge Instructions: Cover dressing Secondary  Dressing: Kerlix 4.5 x 4.1 (in/yd) 1 x Per Day/30 Days Discharge Instructions: Apply Kerlix 4.5 x 4.1 (in/yd) as instructed Secured With: 56M Medipore H Soft Cloth Surgical Tape, 2x2 (in/yd) 1 x Per Day/30 Days Discharge Instructions: Secure kerlix 1. In office sharp debridement 2. Santyl 3. Follow-up in 1 week Electronic Signature(s) Signed: 04/08/2021 11:16:31 AM By: Kalman Shan DO Entered By: Kalman Shan on 04/08/2021 11:15:22 Begnaud, Waniya Carlean Jews ([id number]) -------------------------------------------------------------------------------- ROS/PFSH Details Patient Name: [name], Stewart L. Date of Service: 04/08/2021 9:15 AM Medical Record Number: [id number] Patient Account Number: [id number] Date of Birth/Sex: 1929/02/06 (87 y.o. Female) Treating RN: Dolan Amen Primary Care Provider: Miguel Aschoff Other Clinician: Referring Provider: Miguel Aschoff Treating Provider/Extender: Yaakov Guthrie in Treatment: 1 Information Obtained From Patient Eyes Medical History: Negative for: Cataracts; Glaucoma; Optic Neuritis Ear/Nose/Mouth/Throat Medical History: Negative for: Chronic sinus problems/congestion; Middle ear problems Hematologic/Lymphatic Medical History: Negative for: Anemia; Hemophilia; Human Immunodeficiency Virus; Lymphedema; Sickle Cell Disease Respiratory Medical History: Positive for: Chronic Obstructive Pulmonary Disease (COPD) Cardiovascular Medical History: Positive for: Arrhythmia - Afib; Coronary Artery Disease; Hypertension Gastrointestinal Medical History:  Negative for: Cirrhosis ; Colitis; Crohnos; Hepatitis A; Hepatitis B; Hepatitis C Endocrine Medical History: Positive for: Type II Diabetes Negative for: Type I Diabetes Time with diabetes: 90 Treated with: Insulin Blood sugar tested every day: Yes Tested : Genitourinary Medical History: Negative for: End Stage Renal Disease Immunological Medical History: Negative  for: Lupus Erythematosus; Raynaudos; Scleroderma Integumentary (Skin) Medical History: Negative for: History of Burn; History of pressure wounds Holway, Elvia L. ([id number]) Musculoskeletal Medical History: Positive for: Osteoarthritis Negative for: Gout; Rheumatoid Arthritis; Osteomyelitis Neurologic Medical History: Positive for: Neuropathy Negative for: Dementia; Quadriplegia; Paraplegia; Seizure Disorder Oncologic Medical History: Negative for: Received Chemotherapy; Received Radiation Immunizations Pneumococcal Vaccine: Received Pneumococcal Vaccination: No Implantable Devices None Family and Social History Never smoker Electronic Signature(s) Signed: 04/08/2021 11:16:31 AM By: Kalman Shan DO Signed: 04/08/2021 4:41:30 PM By: Dolan Amen RN Entered By: Kalman Shan on 04/08/2021 11:10:16 Rendall, Dulce L. ([id number]) -------------------------------------------------------------------------------- SuperBill Details Patient Name: Stepanek, Scherrie L. Date of Service: 04/08/2021 Medical Record Number: [id number] Patient Account Number: [id number] Date of Birth/Sex: 12/21/1928 (87 y.o. 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

## 2021-04-09 ENCOUNTER — Telehealth: Payer: Self-pay | Admitting: Family Medicine

## 2021-04-09 DIAGNOSIS — J449 Chronic obstructive pulmonary disease, unspecified: Secondary | ICD-10-CM | POA: Diagnosis not present

## 2021-04-09 DIAGNOSIS — I2510 Atherosclerotic heart disease of native coronary artery without angina pectoris: Secondary | ICD-10-CM | POA: Diagnosis not present

## 2021-04-09 DIAGNOSIS — E7800 Pure hypercholesterolemia, unspecified: Secondary | ICD-10-CM | POA: Diagnosis not present

## 2021-04-09 DIAGNOSIS — Z7951 Long term (current) use of inhaled steroids: Secondary | ICD-10-CM | POA: Diagnosis not present

## 2021-04-09 DIAGNOSIS — E1142 Type 2 diabetes mellitus with diabetic polyneuropathy: Secondary | ICD-10-CM | POA: Diagnosis not present

## 2021-04-09 DIAGNOSIS — U071 COVID-19: Secondary | ICD-10-CM | POA: Diagnosis not present

## 2021-04-09 DIAGNOSIS — G4700 Insomnia, unspecified: Secondary | ICD-10-CM | POA: Diagnosis not present

## 2021-04-09 DIAGNOSIS — E1151 Type 2 diabetes mellitus with diabetic peripheral angiopathy without gangrene: Secondary | ICD-10-CM | POA: Diagnosis not present

## 2021-04-09 DIAGNOSIS — K219 Gastro-esophageal reflux disease without esophagitis: Secondary | ICD-10-CM | POA: Diagnosis not present

## 2021-04-09 DIAGNOSIS — N1831 Chronic kidney disease, stage 3a: Secondary | ICD-10-CM | POA: Diagnosis not present

## 2021-04-09 DIAGNOSIS — R1310 Dysphagia, unspecified: Secondary | ICD-10-CM | POA: Diagnosis not present

## 2021-04-09 DIAGNOSIS — Z7982 Long term (current) use of aspirin: Secondary | ICD-10-CM | POA: Diagnosis not present

## 2021-04-09 DIAGNOSIS — E1122 Type 2 diabetes mellitus with diabetic chronic kidney disease: Secondary | ICD-10-CM | POA: Diagnosis not present

## 2021-04-09 DIAGNOSIS — I129 Hypertensive chronic kidney disease with stage 1 through stage 4 chronic kidney disease, or unspecified chronic kidney disease: Secondary | ICD-10-CM | POA: Diagnosis not present

## 2021-04-09 DIAGNOSIS — Z794 Long term (current) use of insulin: Secondary | ICD-10-CM | POA: Diagnosis not present

## 2021-04-09 DIAGNOSIS — I480 Paroxysmal atrial fibrillation: Secondary | ICD-10-CM | POA: Diagnosis not present

## 2021-04-09 DIAGNOSIS — D631 Anemia in chronic kidney disease: Secondary | ICD-10-CM | POA: Diagnosis not present

## 2021-04-09 DIAGNOSIS — S91012D Laceration without foreign body, left ankle, subsequent encounter: Secondary | ICD-10-CM | POA: Diagnosis not present

## 2021-04-09 DIAGNOSIS — M5136 Other intervertebral disc degeneration, lumbar region: Secondary | ICD-10-CM | POA: Diagnosis not present

## 2021-04-09 DIAGNOSIS — Z7902 Long term (current) use of antithrombotics/antiplatelets: Secondary | ICD-10-CM | POA: Diagnosis not present

## 2021-04-09 DIAGNOSIS — F32A Depression, unspecified: Secondary | ICD-10-CM | POA: Diagnosis not present

## 2021-04-09 NOTE — Telephone Encounter (Signed)
LMOVM for [name] to return call. Okay for [facility] triage to advise.

## 2021-04-09 NOTE — Telephone Encounter (Signed)
Please advise 

## 2021-04-09 NOTE — Telephone Encounter (Signed)
[name] with [facility] called to say patient used a towel that had been washed in Gain Detergent and now has been itching all over,.  No visible rash.  She has been taking benadryl every three hrs, which nurse advised hr to not take that much Benadryl.  [name] would like Dr. Rosanna Randy to advise pt as what to do  CB#  [phone].

## 2021-04-10 ENCOUNTER — Ambulatory Visit: Payer: Self-pay

## 2021-04-10 DIAGNOSIS — Z7951 Long term (current) use of inhaled steroids: Secondary | ICD-10-CM | POA: Diagnosis not present

## 2021-04-10 DIAGNOSIS — E1122 Type 2 diabetes mellitus with diabetic chronic kidney disease: Secondary | ICD-10-CM | POA: Diagnosis not present

## 2021-04-10 DIAGNOSIS — F32A Depression, unspecified: Secondary | ICD-10-CM | POA: Diagnosis not present

## 2021-04-10 DIAGNOSIS — I2510 Atherosclerotic heart disease of native coronary artery without angina pectoris: Secondary | ICD-10-CM | POA: Diagnosis not present

## 2021-04-10 DIAGNOSIS — J449 Chronic obstructive pulmonary disease, unspecified: Secondary | ICD-10-CM | POA: Diagnosis not present

## 2021-04-10 DIAGNOSIS — R1310 Dysphagia, unspecified: Secondary | ICD-10-CM | POA: Diagnosis not present

## 2021-04-10 DIAGNOSIS — Z7902 Long term (current) use of antithrombotics/antiplatelets: Secondary | ICD-10-CM | POA: Diagnosis not present

## 2021-04-10 DIAGNOSIS — N1831 Chronic kidney disease, stage 3a: Secondary | ICD-10-CM | POA: Diagnosis not present

## 2021-04-10 DIAGNOSIS — D631 Anemia in chronic kidney disease: Secondary | ICD-10-CM | POA: Diagnosis not present

## 2021-04-10 DIAGNOSIS — Z7982 Long term (current) use of aspirin: Secondary | ICD-10-CM | POA: Diagnosis not present

## 2021-04-10 DIAGNOSIS — E1151 Type 2 diabetes mellitus with diabetic peripheral angiopathy without gangrene: Secondary | ICD-10-CM | POA: Diagnosis not present

## 2021-04-10 DIAGNOSIS — M5136 Other intervertebral disc degeneration, lumbar region: Secondary | ICD-10-CM | POA: Diagnosis not present

## 2021-04-10 DIAGNOSIS — U071 COVID-19: Secondary | ICD-10-CM | POA: Diagnosis not present

## 2021-04-10 DIAGNOSIS — E1142 Type 2 diabetes mellitus with diabetic polyneuropathy: Secondary | ICD-10-CM | POA: Diagnosis not present

## 2021-04-10 DIAGNOSIS — E1159 Type 2 diabetes mellitus with other circulatory complications: Secondary | ICD-10-CM | POA: Diagnosis not present

## 2021-04-10 DIAGNOSIS — K219 Gastro-esophageal reflux disease without esophagitis: Secondary | ICD-10-CM | POA: Diagnosis not present

## 2021-04-10 DIAGNOSIS — G4700 Insomnia, unspecified: Secondary | ICD-10-CM | POA: Diagnosis not present

## 2021-04-10 DIAGNOSIS — E7800 Pure hypercholesterolemia, unspecified: Secondary | ICD-10-CM | POA: Diagnosis not present

## 2021-04-10 DIAGNOSIS — Z794 Long term (current) use of insulin: Secondary | ICD-10-CM | POA: Diagnosis not present

## 2021-04-10 DIAGNOSIS — I129 Hypertensive chronic kidney disease with stage 1 through stage 4 chronic kidney disease, or unspecified chronic kidney disease: Secondary | ICD-10-CM | POA: Diagnosis not present

## 2021-04-10 DIAGNOSIS — S91012D Laceration without foreign body, left ankle, subsequent encounter: Secondary | ICD-10-CM | POA: Diagnosis not present

## 2021-04-10 DIAGNOSIS — I480 Paroxysmal atrial fibrillation: Secondary | ICD-10-CM | POA: Diagnosis not present

## 2021-04-10 NOTE — Chronic Care Management (AMB) (Signed)
Chronic Care Management   CCM RN Visit Note  04/10/2021 Name: [name] MRN: [id number] DOB: May 21, 1929  Subjective: [name] is a 87 y.o. year old female who is a primary care patient of Jerrol Banana., MD. The care management team was consulted for assistance with disease management and care coordination needs.    Engaged with patient by telephone for follow up visit in response to provider referral for case management and/or care coordination services.   Consent to Services:  The patient was given information about Chronic Care Management services, agreed to services, and gave verbal consent prior to initiation of services.  Please see initial visit note for detailed documentation.    Assessment: Review of patient past medical history, allergies, medications, health status, including review of consultants reports, laboratory and other test data, was performed as part of comprehensive evaluation and provision of chronic care management services.   SDOH (Social Determinants of Health) assessments and interventions performed:    CCM Care Plan  Allergies  Allergen Reactions   Bacitracin-Neomycin-Polymyxin Rash   Neomycin-Bacitracin Zn-Polymyx Swelling and Rash   Latex Rash   Lidocaine Rash   Aricept [Donepezil Hcl] Diarrhea   Benzalkonium Chloride Itching and Swelling   Ibuprofen     Other reaction(s): Dizziness Heart fluttering. Tachycardia. Tachycardia.   Lidocaine Hcl Itching    Per pt, "all caine meds cause severe itching"   Valdecoxib Nausea And Vomiting   Albuterol Rash   Tape Rash    blisters   Triamcinolone Rash    Outpatient Encounter Medications as of 04/10/2021  Medication Sig   ADVAIR HFA 230-21 MCG/ACT inhaler TAKE 2 PUFFS INTO LUNGS TWICE A DAY (Patient taking differently: Inhale 2 puffs into the lungs 2 (two) times daily.)   albuterol (VENTOLIN HFA) 108 (90 Base) MCG/ACT inhaler INHALE 2 PUFFS INTO THE LUNGS EVERY 4 HOURS AS  NEEDED FOR WHEEZING ORSHORTNESS OF BREATH (Patient taking differently: Inhale 2 puffs into the lungs every 4 (four) hours as needed for wheezing or shortness of breath. INHALE 2 PUFFS INTO THE LUNGS EVERY 4 HOURS AS NEEDED FOR WHEEZING ORSHORTNESS OF BREATH)   aspirin 325 MG tablet Take 325 mg by mouth daily. Reported on 01/05/2016   atorvastatin (LIPITOR) 10 MG tablet Take 1 tablet (10 mg total) by mouth daily.   cetirizine (ZYRTEC) 10 MG tablet TAKE ONE TABLET BY MOUTH EVERY DAY (Patient taking differently: Take 10 mg by mouth daily.)   cholecalciferol (VITAMIN D3) 25 MCG (1000 UT) tablet Take 1,000 Units by mouth daily.   clopidogrel (PLAVIX) 75 MG tablet TAKE 1 TABLET BY MOUTH DAILY   diltiazem (TIADYLT ER) 360 MG 24 hr capsule Hold until followup with your outpatient doctor due to your blood pressure on the low side.   ezetimibe (ZETIA) 10 MG tablet TAKE 1 TABLET BY MOUTH DAILY (Patient taking differently: Take 10 mg by mouth daily.)   FLUoxetine (PROZAC) 20 MG capsule Take 1 capsule (20 mg total) by mouth daily.   glucose blood test strip ACCU-CHEK AVIVA PLUS TEST STRP Use to check sugars 3 times daily.   insulin glargine (LANTUS) 100 UNIT/ML injection Inject 0.05 mLs (5 Units total) into the skin at bedtime.   insulin lispro (HUMALOG) 100 UNIT/ML KiwkPen Inject 10 Units into the skin 3 (three) times daily. 10 units before breakfast, 8 before lunch and 18 before supper   iron polysaccharides (NIFEREX) 150 MG capsule Take 1 capsule (150 mg total) by mouth daily.   isosorbide  mononitrate (IMDUR) 30 MG 24 hr tablet Take 30 mg by mouth daily.   lansoprazole (PREVACID) 30 MG capsule TAKE 1 CAPSULE BY MOUTH ONCE DAILY AT NOON (Patient taking differently: Take 30 mg by mouth daily at 12 noon. TAKE 1 CAPSULE BY MOUTH ONCE DAILY AT NOON)   losartan (COZAAR) 100 MG tablet Take 0.5 tablets (50 mg total) by mouth daily. This is a decrease from 100 mg daily.   Magnesium 400 MG CAPS Take by mouth daily.    magnesium oxide (MAG-OX) 400 MG tablet Take 400 mg by mouth daily.   meloxicam (MOBIC) 7.5 MG tablet Take 1 tablet (7.5 mg total) by mouth daily as needed for pain.   memantine (NAMENDA) 5 MG tablet TAKE ONE TABLET TWICE DAILY (Patient taking differently: Take 5 mg by mouth 2 (two) times daily.)   montelukast (SINGULAIR) 10 MG tablet TAKE ONE TABLET AT BEDTIME (Patient taking differently: Take 10 mg by mouth at bedtime.)   ondansetron (ZOFRAN-ODT) 4 MG disintegrating tablet Take 4 mg by mouth every 8 (eight) hours as needed for nausea. For up to 12 doses.   pantoprazole (PROTONIX) 40 MG tablet Take 40 mg by mouth daily. (Patient not taking: No sig reported)   QUEtiapine (SEROQUEL) 25 MG tablet Take 1 tablet by mouth at bedtime.   traZODone (DESYREL) 50 MG tablet TAKE 1 AND 1/2 TABLET AT BEDTIME AS NEEDED FOR SLEEP (Patient taking differently: Take 75 mg by mouth at bedtime as needed for sleep.)   TUSSIN DM 100-10 MG/5ML liquid TAKE 1 TEASPOONFUL BY MOUTH EVERY 4 HOURS AS NEEDED FOR COUGH   vitamin B-12 1000 MCG tablet Take 1 tablet (1,000 mcg total) by mouth daily.   vitamin E 1000 UNIT capsule Take 1,000 Units by mouth daily.   No facility-administered encounter medications on file as of 04/10/2021.    Patient Active Problem List   Diagnosis Date Noted   Atherosclerosis of native arteries of the extremities with ulceration ([place]) 04/07/2021   PSVT (paroxysmal supraventricular tachycardia) ([place]) 51/10/5850   Acute metabolic encephalopathy 77/82/4235   Frequent falls 02/10/2021   COVID-19 virus infection 02/10/2021   Chronic kidney disease, stage 3a ([place]) 02/10/2021   Dementia ([place]) 02/10/2021   Garbled speech, intermittent 02/10/2021   Laceration without foreign body, left ankle, subsequent encounter 02/10/2021   AMS (altered mental status) 02/10/2021   Coronary artery disease involving native coronary artery of native heart 04/17/2020   Celiac artery stenosis ([place]) 06/12/2019   Bilateral  lower abdominal cramping 04/20/2019   UTI symptoms 04/20/2019   History of rectal bleeding 03/26/2019   DM type 2 with diabetic peripheral neuropathy ([place]) 12/14/2018   Arthropathy of lumbar facet joint 09/04/2018   Degeneration of lumbar intervertebral disc 09/04/2018   Spondylolisthesis, grade 1 09/04/2018   Lewy body dementia ([place]) 02/17/2018   PVD (peripheral vascular disease) ([place]) 08/16/2017   Pain in limb 07/27/2016   Hallucinations 07/07/2016   Chest pain 07/01/2016   Depression 06/23/2016   Hypokalemia 09/03/2015   Insomnia 07/23/2015   Headache 07/23/2015   Type 2 diabetes mellitus with vascular disease ([place]) 06/24/2015   Chronic vulvitis 06/02/2015   Allergic rhinitis 05/30/2015   Anemia 05/30/2015   Back ache 05/30/2015   Body mass index (BMI) of 29.0-29.9 in adult 05/30/2015   Edema 05/30/2015   Dilatation of esophagus 05/30/2015   Fall 05/30/2015   H/O deep venous thrombosis 05/30/2015   Hypercholesteremia 05/30/2015   Heart & renal disease, hypertensive, with heart failure ([place]) 05/30/2015  Hyponatremia 05/30/2015   Calculus of kidney 05/30/2015   Gonalgia 05/30/2015   Psoriasis 05/30/2015   Avitaminosis D 05/30/2015   Cough 04/01/2015   Combined fat and carbohydrate induced hyperlipemia 12/30/2014   Paroxysmal atrial fibrillation ([place]) 07/10/2014   Abnormal gait 07/08/2014   Anxiety state 07/08/2014   Chronic kidney disease 07/08/2014   Acid reflux 07/08/2014   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[place]) 07/08/2014   Chronic obstructive pulmonary disease ([place]) 07/08/2014   Type II diabetes mellitus with renal manifestations ([place]) 07/08/2014   Benign essential HTN 06/26/2014   Carotid artery narrowing 06/26/2014   Myalgia 06/26/2014   Basal cell carcinoma of face 05/01/2014   Disease of female genital organs 11/28/2012   Incomplete bladder emptying 09/05/2012   Excessive urination at night 08/15/2012   Basal cell carcinoma of ear 10/26/2011    Conditions to  be addressed/monitored: Wound prevention  and Fall Risk   Patient Care Plan: Fall Risk (Adult)     Problem Identified: Fall Risk      Long-Range Goal: Maintain Function /Absence of Fall and Fall-Related Injury   Start Date: 04/06/2021  Expected End Date: 07/05/2021  Priority: High  Note:   Current Barriers:  Fall Risk r/t Impaired Vision and Impaired Gait  Clinical Goal(s):  Over the next 120 days, patient will not experience falls or require emergent care d/t fall related injuries.  Interventions:  Collaboration with Jerrol Banana., MD regarding development and update of comprehensive plan of care as evidenced by provider attestation and co-signature. Inter-disciplinary care team collaboration (see longitudinal plan of care) Reviewed safety and fall prevention measures.  Tammy reports no falls since her last hospitalization. She continues using her walker. Tammy reports the stitches to her left foot have been removed but the wound has not healed. She is being followed by the [facility]ound Clinic. Also notes that the pulse to the top of her left foot was very difficult to detect during a recent provider appointment. She has a history of stent placement. She will follow up with the Vascular team. Update: Tammy reports Mrs. Bensinger's ability to ambulate has complained due to pain in her left lower abdomen that radiates to her left lower extremity. Tammy reports she has been evaluated in Urgent Care. Reports Mrs. Earnhart has not experienced another fall but complains of constant pain to her left side. Notes that she has been unable to lay in bed d/t pain and currently sleeps in her recliner. She is requesting to forward a PCP message regarding toradol for acute pain. Message forwarded to Dr. Rosanna Randy to determine if Mrs. Fogarty can be scheduled to report to the clinic for a  toradol injection.    Self-Care Deficits/Patient Goals:  -Utilize assistive device appropriately with all  ambulation -Change positions slowly and wear non skid footwear when ambulating -Continue weekly outreach with the Wound Clinic as scheduled -Contact provider or the care management team with questions or new concerns   Follow Up Plan Will follow up next month    Patient Care Plan: Wound to Left Foot     Problem Identified: Infection      Goal: Infection Prevented or Managed   Start Date: 04/06/2021  Expected End Date: 05/06/2021  Priority: High  Note:   Current Barriers:  Care Management support and educational needs related to left foot wound in patient with Diabetes.  Clinical Goal(s):  Over the next 30 days, patient will not require hospitalizations for complications r/t wound infection.  Interventions:  Collaboration with  Jerrol Banana., MD regarding development and update of comprehensive plan of care as evidenced by provider attestation and co-signature Inter-disciplinary care team collaboration (see longitudinal plan of care) Discussed current treatment plan for wound care. Tammy reports that Mrs. Battie currently requires weekly visits to the Wound Clinic for wound cleaning. She requires daily dressing changes. Dressings are currently being changed once a week by the Wound Care staff and three times a week by the [facility] team. Tammy confirmed that the family completes dressing changes on the remaining days. Discussed risk for delayed wound healing d/t diabetes. Thoroughly reviewed s/sx of wound infection and indications for immediately notifying the Wound Clinic. Advised to seek medical attention if worsening changes are noted after business hours. Update: Tammy reports the left foot wound appears to be healing. Notes that Mrs. Stannard has continued to complain of pain on her left side that radiates from her lower abdomen down her left left. Reports she was evaluted in Urgent Care. Will continue to follow up with the Wound Clinic to ensure Mrs.  Pellum's wound is healing properly.    Self Care Activities/Patient Goals: Self administer medications as prescribed Take antibiotics when prescribed and complete course as instructed Attend weekly visits with the Wound Care clinic as scheduled Continue to monitor wound and reports s/sx of infection Calls provider office for questions or new concerns   Follow Up Plan:  Will follow up next month     PLAN A member of the care management team will follow up within the next month.   [facility] [phone]

## 2021-04-13 DIAGNOSIS — I129 Hypertensive chronic kidney disease with stage 1 through stage 4 chronic kidney disease, or unspecified chronic kidney disease: Secondary | ICD-10-CM | POA: Diagnosis not present

## 2021-04-13 DIAGNOSIS — I480 Paroxysmal atrial fibrillation: Secondary | ICD-10-CM | POA: Diagnosis not present

## 2021-04-13 DIAGNOSIS — Z7951 Long term (current) use of inhaled steroids: Secondary | ICD-10-CM | POA: Diagnosis not present

## 2021-04-13 DIAGNOSIS — M5136 Other intervertebral disc degeneration, lumbar region: Secondary | ICD-10-CM | POA: Diagnosis not present

## 2021-04-13 DIAGNOSIS — Z7982 Long term (current) use of aspirin: Secondary | ICD-10-CM | POA: Diagnosis not present

## 2021-04-13 DIAGNOSIS — U071 COVID-19: Secondary | ICD-10-CM | POA: Diagnosis not present

## 2021-04-13 DIAGNOSIS — D631 Anemia in chronic kidney disease: Secondary | ICD-10-CM | POA: Diagnosis not present

## 2021-04-13 DIAGNOSIS — G4700 Insomnia, unspecified: Secondary | ICD-10-CM | POA: Diagnosis not present

## 2021-04-13 DIAGNOSIS — S91012D Laceration without foreign body, left ankle, subsequent encounter: Secondary | ICD-10-CM | POA: Diagnosis not present

## 2021-04-13 DIAGNOSIS — F32A Depression, unspecified: Secondary | ICD-10-CM | POA: Diagnosis not present

## 2021-04-13 DIAGNOSIS — E1151 Type 2 diabetes mellitus with diabetic peripheral angiopathy without gangrene: Secondary | ICD-10-CM | POA: Diagnosis not present

## 2021-04-13 DIAGNOSIS — E1142 Type 2 diabetes mellitus with diabetic polyneuropathy: Secondary | ICD-10-CM | POA: Diagnosis not present

## 2021-04-13 DIAGNOSIS — R1310 Dysphagia, unspecified: Secondary | ICD-10-CM | POA: Diagnosis not present

## 2021-04-13 DIAGNOSIS — E1122 Type 2 diabetes mellitus with diabetic chronic kidney disease: Secondary | ICD-10-CM | POA: Diagnosis not present

## 2021-04-13 DIAGNOSIS — I2510 Atherosclerotic heart disease of native coronary artery without angina pectoris: Secondary | ICD-10-CM | POA: Diagnosis not present

## 2021-04-13 DIAGNOSIS — N1831 Chronic kidney disease, stage 3a: Secondary | ICD-10-CM | POA: Diagnosis not present

## 2021-04-13 DIAGNOSIS — K219 Gastro-esophageal reflux disease without esophagitis: Secondary | ICD-10-CM | POA: Diagnosis not present

## 2021-04-13 DIAGNOSIS — Z794 Long term (current) use of insulin: Secondary | ICD-10-CM | POA: Diagnosis not present

## 2021-04-13 DIAGNOSIS — Z7902 Long term (current) use of antithrombotics/antiplatelets: Secondary | ICD-10-CM | POA: Diagnosis not present

## 2021-04-13 DIAGNOSIS — E7800 Pure hypercholesterolemia, unspecified: Secondary | ICD-10-CM | POA: Diagnosis not present

## 2021-04-13 DIAGNOSIS — J449 Chronic obstructive pulmonary disease, unspecified: Secondary | ICD-10-CM | POA: Diagnosis not present

## 2021-04-14 ENCOUNTER — Other Ambulatory Visit: Payer: Self-pay

## 2021-04-14 ENCOUNTER — Ambulatory Visit: Payer: Self-pay

## 2021-04-14 ENCOUNTER — Encounter: Payer: Medicare Other | Admitting: Physician Assistant

## 2021-04-14 ENCOUNTER — Telehealth: Payer: Self-pay | Admitting: Family Medicine

## 2021-04-14 DIAGNOSIS — T8133XA Disruption of traumatic injury wound repair, initial encounter: Secondary | ICD-10-CM | POA: Diagnosis not present

## 2021-04-14 DIAGNOSIS — E1159 Type 2 diabetes mellitus with other circulatory complications: Secondary | ICD-10-CM

## 2021-04-14 DIAGNOSIS — I480 Paroxysmal atrial fibrillation: Secondary | ICD-10-CM | POA: Diagnosis not present

## 2021-04-14 DIAGNOSIS — E11621 Type 2 diabetes mellitus with foot ulcer: Secondary | ICD-10-CM | POA: Diagnosis not present

## 2021-04-14 DIAGNOSIS — E1140 Type 2 diabetes mellitus with diabetic neuropathy, unspecified: Secondary | ICD-10-CM | POA: Diagnosis not present

## 2021-04-14 DIAGNOSIS — I872 Venous insufficiency (chronic) (peripheral): Secondary | ICD-10-CM | POA: Diagnosis not present

## 2021-04-14 DIAGNOSIS — S91012S Laceration without foreign body, left ankle, sequela: Secondary | ICD-10-CM | POA: Diagnosis not present

## 2021-04-14 DIAGNOSIS — Z9181 History of falling: Secondary | ICD-10-CM

## 2021-04-14 DIAGNOSIS — T8131XA Disruption of external operation (surgical) wound, not elsewhere classified, initial encounter: Secondary | ICD-10-CM | POA: Diagnosis not present

## 2021-04-14 DIAGNOSIS — L97322 Non-pressure chronic ulcer of left ankle with fat layer exposed: Secondary | ICD-10-CM | POA: Diagnosis not present

## 2021-04-14 NOTE — Telephone Encounter (Signed)
Called pt to reschedule appt , she mention that she is having pain, itching  and she pulled a muscle. Offered pt appt with [facility] the patient decline.pt is asking for medication to be sent in for pulled muscle.  Please advice

## 2021-04-14 NOTE — Telephone Encounter (Signed)
Please advise. Thanks.  

## 2021-04-14 NOTE — Progress Notes (Addendum)
SHARIYAH, ELAND ([id number]) Visit Report for 04/14/2021 Chief Complaint Document Details Patient Name: [name], [name] Date of Service: 04/14/2021 2:30 PM Medical Record Number: [id number] Patient Account Number: [id number] Date of Birth/Sex: October 12, 1928 (87 y.o. F) Treating RN: Carlene Coria Primary Care Provider: Miguel Aschoff Other Clinician: Referring Provider: Miguel Aschoff Treating Provider/Extender: Skipper Cliche in Treatment: 1 Information Obtained from: Patient Chief Complaint Left ankle wounds Electronic Signature(s) Signed: 04/14/2021 2:37:57 PM By: Worthy Keeler PA-C Entered By: Worthy Keeler on 04/14/2021 14:37:57 Supple, Nathanial Rancher ([id number]) -------------------------------------------------------------------------------- Debridement Details Patient Name: [name], [name] Date of Service: 04/14/2021 2:30 PM Medical Record Number: [id number] Patient Account Number: [id number] Date of Birth/Sex: [birth year] (87 y.o. F) Treating RN: Carlene Coria Primary Care Provider: Miguel Aschoff Other Clinician: Referring Provider: Miguel Aschoff Treating Provider/Extender: Skipper Cliche in Treatment: 1 Debridement Performed for Wound #1 Left,Lateral Malleolus Assessment: Performed By: Physician Tommie Sams., PA-C Debridement Type: Debridement Level of Consciousness (Pre- Awake and Alert procedure): Pre-procedure Verification/Time Out Yes - 15:07 Taken: Start Time: 15:07 Total Area Debrided (L x W): 0.6 (cm) x 2.7 (cm) = 1.62 (cm) Tissue and other material Viable, Non-Viable, Slough, Subcutaneous, Skin: Dermis , Skin: Epidermis, Slough debrided: Level: Skin/Subcutaneous Tissue Debridement Description: Excisional Instrument: Curette Bleeding: Moderate Hemostasis Achieved: Pressure End Time: 15:11 Procedural Pain: 0 Post Procedural Pain: 0 Response to Treatment: Procedure was tolerated well Level of Consciousness (Post- Awake  and Alert procedure): Post Debridement Measurements of Total Wound Length: (cm) 0.6 Width: (cm) 2.7 Depth: (cm) 0.6 Volume: (cm) 0.763 Character of Wound/Ulcer Post Debridement: Improved Post Procedure Diagnosis Same as Pre-procedure Electronic Signature(s) Signed: 04/14/2021 4:37:04 PM By: Carlene Coria RN Signed: 04/14/2021 6:10:04 PM By: Worthy Keeler PA-C Entered By: Carlene Coria on 04/14/2021 15:10:22 Lamphier, Hiilei L. ([id number]) -------------------------------------------------------------------------------- HPI Details Patient Name: Agent, Keaira L. Date of Service: 04/14/2021 2:30 PM Medical Record Number: [id number] Patient Account Number: [id number] Date of Birth/Sex: 1929-01-11 (87 y.o. F) Treating RN: Carlene Coria Primary Care Provider: Miguel Aschoff Other Clinician: Referring Provider: Miguel Aschoff Treating Provider/Extender: Skipper Cliche in Treatment: 1 History of Present Illness HPI Description: Admission 7/6 [name] is a 87 year old female with a past medical history of paroxysmal A. fib not anticoagulated and type 2 diabetes on insulin that presents to the clinic for a 1-35-month history of nonhealing wound to her left ankle. She states she tripped and fell while trying to pick up some food her neighbors left on her porch. She ended up cutting her left ankle. She went to that ED and had sutures placed. Unfortunately they did not stay in place and the wound dehisced. She has been using PolyMem silver to the wound. She currently denies signs of infection. 7/13; patient presents for 1 week follow-up. She has been using Santyl to the surgical wound site and silver alginate to the abrasion. The abrasion site has healed. She has no complaints today. She followed up with vein and vascular and had ABIs done. She states that the doctor mentioned that she has enough blood flow for wound healing. She denies signs of infection  today. 04/14/2021 upon evaluation today patient presents for reevaluation concerning issues that she has been having with her wound in the left lateral ankle region. Fortunately there does not appear to be any signs of infection which is great news. With that being said she is somewhat swollen and she also does have issues noted currently with necrotic tissue on the surface of the wound.  I think that we need to address that today to be honest. Electronic Signature(s) Signed: 04/14/2021 5:31:43 PM By: Worthy Keeler PA-C Entered By: Worthy Keeler on 04/14/2021 17:31:43 Boak, Nathanial Rancher ([id number]) -------------------------------------------------------------------------------- Physical Exam Details Patient Name: [name], [name] Date of Service: 04/14/2021 2:30 PM Medical Record Number: [id number] Patient Account Number: [id number] Date of Birth/Sex: 23-Sep-1929 (87 y.o. F) Treating RN: Carlene Coria Primary Care Provider: Miguel Aschoff Other Clinician: Referring Provider: Miguel Aschoff Treating Provider/Extender: Skipper Cliche in Treatment: 1 Constitutional Well-nourished and well-hydrated in no acute distress. Respiratory normal breathing without difficulty. Psychiatric this patient is able to make decisions and demonstrates good insight into disease process. Alert and Oriented x 3. pleasant and cooperative. Notes Upon inspection patient's wound bed did require sharp debridement and I did perform this today without complication she had some bleeding but that was okay and overall postdebridement the wound bed appears to be doing significantly better. I think that having this clean as well as stimulating the wound surface is actually getting help with allowing this to heal much more effectively and quickly. Electronic Signature(s) Signed: 04/14/2021 5:32:04 PM By: Worthy Keeler PA-C Entered By: Worthy Keeler on 04/14/2021 17:32:04 Wexler, Nathanial Rancher  ([id number]) -------------------------------------------------------------------------------- Physician Orders Details Patient Name: Tammy Blade L. Date of Service: 04/14/2021 2:30 PM Medical Record Number: [id number] Patient Account Number: [id number] Date of Birth/Sex: 1928-12-07 (87 y.o. F) Treating RN: Carlene Coria Primary Care Provider: Miguel Aschoff Other Clinician: Referring Provider: Miguel Aschoff Treating Provider/Extender: Skipper Cliche in Treatment: 1 Verbal / Phone Orders: No Diagnosis Coding ICD-10 Coding Code Description P38.250 Non-pressure chronic ulcer of left ankle with unspecified severity E11.621 Type 2 diabetes mellitus with foot ulcer Follow-up Appointments o Return Appointment in 1 week. o Nurse Visit as needed [place]: - [facility] [place] for wound care. May utilize formulary equivalent dressing for wound treatment orders unless otherwise specified. Home Health Nurse may visit PRN to address patientos wound care needs. [facility] if supplies unavailable do not remove dressing will change at next MD visit Bathing/ Shower/ Hygiene o Clean wound with Normal Saline or wound cleanser. o May shower with wound dressing protected with water repellent cover or cast protector. Edema Control - Lymphedema / Segmental Compressive Device / Other o Optional: One layer of unna paste to top of compression wrap (to act as an anchor). o Elevate, Exercise Daily and Avoid Standing for Long Periods of Time. o Elevate legs to the level of the heart and pump ankles as often as possible o Elevate leg(s) parallel to the floor when sitting. Wound Treatment Wound #1 - Malleolus Wound Laterality: Left, Lateral Cleanser: Normal Saline 2 x Per Week/30 Days Discharge Instructions: Wash your hands with soap and water. Remove old dressing, discard into plastic bag and place into trash. Cleanse the wound with Normal  Saline prior to applying a clean dressing using gauze sponges, not tissues or cotton balls. Do not scrub or use excessive force. Pat dry using gauze sponges, not tissue or cotton balls. Primary Dressing: Hydrofera Blue Ready Transfer Foam, 4x5 (in/in) 2 x Per Week/30 Days Discharge Instructions: Apply Hydrofera Blue Ready to wound bed as directed Secondary Dressing: ABD Pad 5x9 (in/in) 2 x Per Week/30 Days Discharge Instructions: Cover dressing Compression Wrap: Profore Lite LF 3 Multilayer Compression Bandaging System 2 x Per Week/30 Days Discharge Instructions: Apply 3 multi-layer wrap as prescribed. Electronic Signature(s) Signed: 04/14/2021 4:37:04 PM By: Dolores Lory,  Carrie RN Signed: 04/14/2021 6:10:04 PM By: Worthy Keeler PA-C Entered By: Carlene Coria on 04/14/2021 15:18:13 Bustamante, Cheris Carlean Jews ([id number]) -------------------------------------------------------------------------------- Problem List Details Patient Name: Cuadrado, Janissa L. Date of Service: 04/14/2021 2:30 PM Medical Record Number: [id number] Patient Account Number: [id number] Date of Birth/Sex: 02-15-1929 (87 y.o. F) Treating RN: Carlene Coria Primary Care Provider: Miguel Aschoff Other Clinician: Referring Provider: Miguel Aschoff Treating Provider/Extender: Skipper Cliche in Treatment: 1 Active Problems ICD-10 Encounter Code Description Active Date MDM Diagnosis S91.012S Laceration without foreign body, left ankle, sequela 04/14/2021 No Yes L97.322 Non-pressure chronic ulcer of left ankle with fat layer exposed 04/01/2021 No Yes I87.2 Venous insufficiency (chronic) (peripheral) 04/14/2021 No Yes E11.621 Type 2 diabetes mellitus with foot ulcer 04/01/2021 No Yes Inactive Problems Resolved Problems Electronic Signature(s) Signed: 04/14/2021 5:31:34 PM By: Worthy Keeler PA-C Previous Signature: 04/14/2021 2:37:51 PM Version By: Worthy Keeler PA-C Entered By: Worthy Keeler on 04/14/2021 17:31:34 Minor,  Roselie L. ([id number]) -------------------------------------------------------------------------------- Progress Note Details Patient Name: Demars, Eldonna L. Date of Service: 04/14/2021 2:30 PM Medical Record Number: [id number] Patient Account Number: [id number] Date of Birth/Sex: 01-12-1929 (87 y.o. F) Treating RN: Carlene Coria Primary Care Provider: Miguel Aschoff Other Clinician: Referring Provider: Miguel Aschoff Treating Provider/Extender: Skipper Cliche in Treatment: 1 Subjective Chief Complaint Information obtained from Patient Left ankle wounds History of Present Illness (HPI) Admission 7/6 Ms. Jaquetta Currier is a 87 year old female with a past medical history of paroxysmal A. fib not anticoagulated and type 2 diabetes on insulin that presents to the clinic for a 1-59-month history of nonhealing wound to her left ankle. She states she tripped and fell while trying to pick up some food her neighbors left on her porch. She ended up cutting her left ankle. She went to that ED and had sutures placed. Unfortunately they did not stay in place and the wound dehisced. She has been using PolyMem silver to the wound. She currently denies signs of infection. 7/13; patient presents for 1 week follow-up. She has been using Santyl to the surgical wound site and silver alginate to the abrasion. The abrasion site has healed. She has no complaints today. She followed up with vein and vascular and had ABIs done. She states that the doctor mentioned that she has enough blood flow for wound healing. She denies signs of infection today. 04/14/2021 upon evaluation today patient presents for reevaluation concerning issues that she has been having with her wound in the left lateral ankle region. Fortunately there does not appear to be any signs of infection which is great news. With that being said she is somewhat swollen and she also does have issues noted currently with necrotic tissue on  the surface of the wound. I think that we need to address that today to be honest. Objective Constitutional Well-nourished and well-hydrated in no acute distress. Vitals Time Taken: 2:40 PM, Height: 62 in, Temperature: 98.1 F, Pulse: 97 bpm, Respiratory Rate: 18 breaths/min, Blood Pressure: 163/70 mmHg. General Notes: CBG 108 Respiratory normal breathing without difficulty. Psychiatric this patient is able to make decisions and demonstrates good insight into disease process. Alert and Oriented x 3. pleasant and cooperative. General Notes: Upon inspection patient's wound bed did require sharp debridement and I did perform this today without complication she had some bleeding but that was okay and overall postdebridement the wound bed appears to be doing significantly better. I think that having this clean as well as stimulating the wound surface is actually getting help  with allowing this to heal much more effectively and quickly. Integumentary (Hair, Skin) Wound #1 status is Open. Original cause of wound was Surgical Injury. The date acquired was: 02/08/2021. The wound has been in treatment 1 weeks. The wound is located on the Left,Lateral Malleolus. The wound measures 0.6cm length x 2.7cm width x 0.6cm depth; 1.272cm^2 area and 0.763cm^3 volume. There is Fat Layer (Subcutaneous Tissue) exposed. There is no tunneling or undermining noted. There is a medium amount of serosanguineous drainage noted. There is small (1-33%) red granulation within the wound bed. There is a large (67-100%) amount of necrotic tissue within the wound bed including Adherent Slough. Assessment Wagoner, Arthi L. ([id number]) Active Problems ICD-10 Laceration without foreign body, left ankle, sequela Non-pressure chronic ulcer of left ankle with fat layer exposed Venous insufficiency (chronic) (peripheral) Type 2 diabetes mellitus with foot ulcer Procedures Wound #1 Pre-procedure diagnosis of Wound #1 is a  Dehisced Wound located on the Left,Lateral Malleolus . There was a Excisional Skin/Subcutaneous Tissue Debridement with a total area of 1.62 sq cm performed by Tommie Sams., PA-C. With the following instrument(s): Curette to remove Viable and Non-Viable tissue/material. Material removed includes Subcutaneous Tissue, Slough, Skin: Dermis, and Skin: Epidermis. No specimens were taken. A time out was conducted at 15:07, prior to the start of the procedure. A Moderate amount of bleeding was controlled with Pressure. The procedure was tolerated well with a pain level of 0 throughout and a pain level of 0 following the procedure. Post Debridement Measurements: 0.6cm length x 2.7cm width x 0.6cm depth; 0.763cm^3 volume. Character of Wound/Ulcer Post Debridement is improved. Post procedure Diagnosis Wound #1: Same as Pre-Procedure Plan Follow-up Appointments: Return Appointment in 1 week. Nurse Visit as needed Home Health: [place] for wound care. May utilize formulary equivalent dressing for wound treatment orders unless otherwise specified. Home Health Nurse may visit PRN to address patient s wound care needs. [facility] if supplies unavailable do not remove dressing will change at next MD visit Bathing/ Shower/ Hygiene: Clean wound with Normal Saline or wound cleanser. May shower with wound dressing protected with water repellent cover or cast protector. Edema Control - Lymphedema / Segmental Compressive Device / Other: Optional: One layer of unna paste to top of compression wrap (to act as an anchor). Elevate, Exercise Daily and Avoid Standing for Long Periods of Time. Elevate legs to the level of the heart and pump ankles as often as possible Elevate leg(s) parallel to the floor when sitting. WOUND #1: - Malleolus Wound Laterality: Left, Lateral Cleanser: Normal Saline 2 x Per Week/30 Days Discharge Instructions: Wash your hands with soap and  water. Remove old dressing, discard into plastic bag and place into trash. Cleanse the wound with Normal Saline prior to applying a clean dressing using gauze sponges, not tissues or cotton balls. Do not scrub or use excessive force. Pat dry using gauze sponges, not tissue or cotton balls. Primary Dressing: Hydrofera Blue Ready Transfer Foam, 4x5 (in/in) 2 x Per Week/30 Days Discharge Instructions: Apply Hydrofera Blue Ready to wound bed as directed Secondary Dressing: ABD Pad 5x9 (in/in) 2 x Per Week/30 Days Discharge Instructions: Cover dressing Compression Wrap: Profore Lite LF 3 Multilayer Compression Bandaging System 2 x Per Week/30 Days Discharge Instructions: Apply 3 multi-layer wrap as prescribed. 1. Would recommend currently that we actually initiate treatment with [facility] I think this is doing a great job for her. 2. I am also can recommend  that we actually go ahead and initiate treatment with a 3 layer compression wrap I think that is can be appropriate. 3. I am also going to suggest that we have the patient continue with the elevation to try to keep edema under control although I think the compression wrap is good to do a great job. We will have home health change the wrap out once during the week as well. We will see patient back for reevaluation in 1 week here in the clinic. If anything worsens or changes patient will contact our office for additional recommendations. Electronic Signature(s) Signed: 04/14/2021 5:32:39 PM By: Worthy Keeler PA-C Entered By: Worthy Keeler on 04/14/2021 17:32:39 Stidd, Nathanial Rancher ([id number]) [place], Nathanial Rancher ([id number]) -------------------------------------------------------------------------------- SuperBill Details Patient Name: Tammy Blade L. Date of Service: 04/14/2021 Medical Record Number: [id number] Patient Account Number: [id number] Date of Birth/Sex: 05-30-1929 (87 y.o. F) Treating RN: Carlene Coria Primary Care  Provider: Miguel Aschoff Other Clinician: Referring Provider: Miguel Aschoff Treating Provider/Extender: Skipper Cliche in Treatment: 1 Diagnosis Coding ICD-10 Codes Code Description [phone] Laceration without foreign body, left ankle, sequela L97.322 Non-pressure chronic ulcer of left ankle with fat layer exposed I87.2 Venous insufficiency (chronic) (peripheral) E11.621 Type 2 diabetes mellitus with foot ulcer Facility Procedures CPT4 Code: [id number] Description: 28366 - DEB SUBQ TISSUE 20 SQ CM/< Modifier: Quantity: 1 CPT4 Code: Description: ICD-10 Diagnosis Description Q94.765 Non-pressure chronic ulcer of left ankle with fat layer exposed Modifier: Quantity: Physician Procedures CPT4 Code: [id number] Description: 11042 - WC PHYS SUBQ TISS 20 SQ CM Modifier: Quantity: 1 CPT4 Code: Description: ICD-10 Diagnosis Description S56.812 Non-pressure chronic ulcer of left ankle with fat layer exposed Modifier: Quantity: Electronic Signature(s) Signed: 04/14/2021 5:32:57 PM By: Worthy Keeler PA-C Entered By: Worthy Keeler on 04/14/2021 17:32:57

## 2021-04-14 NOTE — Progress Notes (Addendum)
[name], [name] ([id number]) Visit Report for 04/14/2021 Arrival Information Details Patient Name: [name], [name]. Date of Service: 04/14/2021 2:30 PM Medical Record Number: [id number] Patient Account Number: [id number] Date of Birth/Sex: 11-08-1928 (87 y.o. F) Treating RN: [name] Primary Care [name]: [name] Other Clinician: Referring [name]: [name] Treating [name]/Extender: [name] in Treatment: 1 Visit Information History Since Last Visit All ordered tests and consults were completed: No Patient Arrived: [name] Added or deleted any medications: No Arrival Time: 14:40 Any new allergies or adverse reactions: No Accompanied By: son Had a fall or experienced change in No Transfer Assistance: None activities of daily living that may affect Patient Identification Verified: Yes risk of falls: Secondary Verification Process Completed: Yes Signs or symptoms of abuse/neglect since last visito No Patient Requires Transmission-Based No Hospitalized since last visit: No Precautions: Implantable device outside of the clinic excluding No Patient Has Alerts: Yes cellular tissue based products placed in the center Patient Alerts: Patient on Blood Thinner since last visit: ***PLAVIX*** Has Dressing in Place as Prescribed: Yes ALLERGIC TO Pain Present Now: No LIDOCAINE Electronic Signature(s) Signed: 04/14/2021 4:37:04 PM By: Tammy Coria RN Entered By: [name] on 04/14/2021 14:40:48 [name], [name]. ([id number]) -------------------------------------------------------------------------------- Clinic Level of Care Assessment Details Patient Name: [name], [name]. Date of Service: 04/14/2021 2:30 PM Medical Record Number: [id number] Patient Account Number: [id number] Date of Birth/Sex: 11-30-1928 (87 y.o. F) Treating RN: [name] Primary Care Tahjay Binion: [name] Other Clinician: Referring [name]: [name] Treating [name]/Extender: [name] in Treatment: 1 Clinic Level of Care Assessment Items TOOL 1 Quantity Score []  - Use when EandM and Procedure is performed on INITIAL visit 0 ASSESSMENTS - Nursing Assessment / Reassessment []  - General Physical Exam (combine w/ comprehensive assessment (listed just below) when performed on new 0 pt. evals) []  - 0 Comprehensive Assessment (HX, ROS, Risk Assessments, Wounds Hx, etc.) ASSESSMENTS - Wound and Skin Assessment / Reassessment []  - Dermatologic / Skin Assessment (not related to wound area) 0 ASSESSMENTS - Ostomy and/or Continence Assessment and Care []  - Incontinence Assessment and Management 0 []  - 0 Ostomy Care Assessment and Management (repouching, etc.) PROCESS - Coordination of Care []  - Simple Patient / Family Education for ongoing care 0 []  - 0 Complex (extensive) Patient / Family Education for ongoing care []  - 0 Staff obtains Programmer, systems, Records, Test Results / Process Orders []  - 0 Staff telephones HHA, Nursing Homes / Clarify orders / etc []  - 0 Routine Transfer to another Facility (non-emergent condition) []  - 0 Routine Hospital Admission (non-emergent condition) []  - 0 New Admissions / Biomedical engineer / Ordering NPWT, Apligraf, etc. []  - 0 Emergency Hospital Admission (emergent condition) PROCESS - Special Needs []  - Pediatric / Minor Patient Management 0 []  - 0 Isolation Patient Management []  - 0 Hearing / Language / Visual special needs []  - 0 Assessment of Community assistance (transportation, D/C planning, etc.) []  - 0 Additional assistance / Altered mentation []  - 0 Support Surface(s) Assessment (bed, cushion, seat, etc.) INTERVENTIONS - Miscellaneous []  - External ear exam 0 []  - 0 Patient Transfer (multiple staff / Civil Service fast streamer / Similar devices) []  - 0 Simple Staple / Suture removal (25 or less) []  - 0 Complex Staple / Suture removal (26 or more) []  - 0 Hypo/Hyperglycemic  Management (do not check if billed separately) []  - 0 Ankle / Brachial Index (ABI) - do not check if billed separately Has the patient been seen at the hospital  within the last three years: Yes Total Score: 0 Level Of Care: ____ [name] ([id number]) Electronic Signature(s) Signed: 04/14/2021 4:37:04 PM By: Tammy Coria RN Entered By: [name] on 04/14/2021 15:18:28 [name], [name] ([id number]) -------------------------------------------------------------------------------- Encounter Discharge Information Details Patient Name: [name]. Date of Service: 04/14/2021 2:30 PM Medical Record Number: [id number] Patient Account Number: [id number] Date of Birth/Sex: [birth year] (87 y.o. F) Treating RN: [name] Primary Care [name]: [name] Other Clinician: Referring [name]: [name] Treating [name]/Extender: [name] in Treatment: 1 Encounter Discharge Information Items Post Procedure Vitals Discharge Condition: Stable Temperature (F): 98.1 Ambulatory Status: Walker Pulse (bpm): 97 Discharge Destination: Home Respiratory Rate (breaths/min): 18 Transportation: Private Auto Blood Pressure (mmHg): 163/70 Accompanied By: son Schedule Follow-up Appointment: Yes Clinical Summary of Care: Patient Declined Electronic Signature(s) Signed: 04/14/2021 4:37:04 PM By: Tammy Coria RN Entered By: [name] on 04/14/2021 15:12:03 [name], [name]. ([id number]) -------------------------------------------------------------------------------- Lower Extremity Assessment Details Patient Name: [name], [name]. Date of Service: 04/14/2021 2:30 PM Medical Record Number: [id number] Patient Account Number: [id number] Date of Birth/Sex: May 24, 1929 (87 y.o. F) Treating RN: [name] Primary Care [name]: [name] Other Clinician: Referring [name]: [name] Treating [name]/Extender: [name] Weeks in  Treatment: 1 Electronic Signature(s) Signed: 04/14/2021 4:37:04 PM By: Tammy Coria RN Entered By: [name] on 04/14/2021 14:49:46 [name], [name] Kitchen ([id number]) -------------------------------------------------------------------------------- Multi Wound Chart Details Patient Name: [name], [name]. Date of Service: 04/14/2021 2:30 PM Medical Record Number: [id number] Patient Account Number: [id number] Date of Birth/Sex: [birth year] (87 y.o. F) Treating RN: [name] Primary Care Benisha Hadaway: [name] Other Clinician: Referring Marquisa Salih: [name] Treating Areli Jowett/Extender: [name] in Treatment: 1 Vital Signs Height(in): 48 Pulse(bpm): 62 Weight(lbs): Blood Pressure(mmHg): 163/70 Body Mass Index(BMI): Temperature(F): 98.1 Respiratory Rate(breaths/min): 18 Photos: [N/A:N/A] Wound Location: Left, Lateral Malleolus N/A N/A Wounding Event: Surgical Injury N/A N/A Primary Etiology: Dehisced Wound N/A N/A Comorbid History: Chronic Obstructive Pulmonary N/A N/A Disease (COPD), Arrhythmia, Coronary Artery Disease, Hypertension, Type II Diabetes, Osteoarthritis, Neuropathy Date Acquired: 02/08/2021 N/A N/A Weeks of Treatment: 1 N/A N/A Wound Status: Open N/A N/A Measurements Hall x W x D (cm) 0.6x2.7x0.6 N/A N/A Area (cm) : 1.272 N/A N/A Volume (cm) : 0.763 N/A N/A % Reduction in Area: -35.00% N/A N/A % Reduction in Volume: -1.20% N/A N/A Classification: Full Thickness Without Exposed N/A N/A Support Structures Exudate Amount: Medium N/A N/A Exudate Type: Serosanguineous N/A N/A Exudate Color: red, brown N/A N/A Granulation Amount: Small (1-33%) N/A N/A Granulation Quality: Red N/A N/A Necrotic Amount: Large (67-100%) N/A N/A Exposed Structures: Fat Layer (Subcutaneous Tissue): N/A N/A Yes Fascia: No Tendon: No Muscle: No Joint: No Bone: No Epithelialization: None N/A N/A Treatment Notes Electronic Signature(s) Signed: 04/14/2021  4:37:04 PM By: Tammy Coria RN Entered By: [name] on 04/14/2021 15:04:22 Marcus, Nathanial Rancher ([id number]) -------------------------------------------------------------------------------- [place] Details Patient Name: Tammy Blade Hall. Date of Service: 04/14/2021 2:30 PM Medical Record Number: [id number] Patient Account Number: [id number] Date of Birth/Sex: [birth year] (87 y.o. F) Treating RN: [name] Primary Care Bhakti Labella: [name] Other Clinician: Referring Ravinder Lukehart: [name] Treating Deona Novitski/Extender: [name] in Treatment: 1 Active Inactive Wound/Skin Impairment Nursing Diagnoses: Impaired tissue integrity Goals: Patient/caregiver will verbalize understanding of skin care regimen Date Initiated: 04/01/2021 Target Resolution Date: 05/02/2021 Goal Status: Active Ulcer/skin breakdown will have a volume reduction of 30% by week 4 Date Initiated: 04/01/2021 Target Resolution Date: 05/02/2021 Goal Status: Active Ulcer/skin breakdown will have a volume reduction of  50% by week 8 Date Initiated: 04/01/2021 Target Resolution Date: 06/02/2021 Goal Status: Active Ulcer/skin breakdown will have a volume reduction of 80% by week 12 Date Initiated: 04/01/2021 Target Resolution Date: 07/02/2021 Goal Status: Active Ulcer/skin breakdown will heal within 14 weeks Date Initiated: 04/01/2021 Target Resolution Date: 08/02/2021 Goal Status: Active Interventions: Assess patient/caregiver ability to obtain necessary supplies Assess patient/caregiver ability to perform ulcer/skin care regimen upon admission and as needed Assess ulceration(s) every visit Provide education on ulcer and skin care Treatment Activities: Patient referred to home care : 04/01/2021 Skin care regimen initiated : 04/01/2021 Notes: Electronic Signature(s) Signed: 04/14/2021 4:37:04 PM By: Tammy Coria RN Entered By: [name] on 04/14/2021 15:04:13 Cabana, Hester Hall.  ([id number]) -------------------------------------------------------------------------------- Pain Assessment Details Patient Name: [name], [name]. Date of Service: 04/14/2021 2:30 PM Medical Record Number: [id number] Patient Account Number: [id number] Date of Birth/Sex: [birth year] (87 y.o. F) Treating RN: [name] Primary Care Laren Orama: [name] Other Clinician: Referring Aleisa Howk: [name] Treating Sharifah Champine/Extender: [name] in Treatment: 1 Active Problems Location of Pain Severity and Description of Pain Patient Has Paino No Site Locations Pain Management and Medication Current Pain Management: Electronic Signature(s) Signed: 04/14/2021 4:37:04 PM By: Tammy Coria RN Entered By: [name] on 04/14/2021 14:41:27 Tench, Prudy Carlean Hall ([id number]) -------------------------------------------------------------------------------- Patient/Caregiver Education Details Patient Name: [name], [name]. Date of Service: 04/14/2021 2:30 PM Medical Record Number: [id number] Patient Account Number: [id number] Date of Birth/Gender: 31-Aug-1929 (87 y.o. F) Treating RN: [name] Primary Care Physician: [name] Other Clinician: Referring Physician: Miguel Hall Treating Physician/Extender: [name] in Treatment: 1 Education Assessment Education Provided To: Patient Education Topics Provided Wound/Skin Impairment: Methods: Printed Responses: State content correctly Electronic Signature(s) Signed: 04/14/2021 4:37:04 PM By: Tammy Coria RN Entered By: [name] on 04/14/2021 15:18:41 Butrum, Malanie Hall Kitchen ([id number]) -------------------------------------------------------------------------------- Wound Assessment Details Patient Name: Carnathan, [place] Hall. Date of Service: 04/14/2021 2:30 PM Medical Record Number: [id number] Patient Account Number: [id number] Date of Birth/Sex: 1929-03-29 (87 y.o. F) Treating RN: [name] Primary Care Hannie Shoe: [name] Other Clinician: Referring Arsenio Schnorr: [name] Treating Daisa Stennis/Extender: [name] in Treatment: 1 Wound Status Wound Number: 1 Primary Dehisced Wound Etiology: Wound Location: Left, Lateral Malleolus Wound Open Wounding Event: Surgical Injury Status: Date Acquired: 02/08/2021 Comorbid Chronic Obstructive Pulmonary Disease (COPD), Weeks Of Treatment: 1 History: Arrhythmia, Coronary Artery Disease, Hypertension, Type Clustered Wound: No II Diabetes, Osteoarthritis, Neuropathy Photos Wound Measurements Length: (cm) 0.6 Width: (cm) 2.7 Depth: (cm) 0.6 Area: (cm) 1.272 Volume: (cm) 0.763 % Reduction in Area: -35% % Reduction in Volume: -1.2% Epithelialization: None Tunneling: No Undermining: No Wound Description Classification: Full Thickness Without Exposed Support Structures Exudate Amount: Medium Exudate Type: Serosanguineous Exudate Color: red, brown Foul Odor After Cleansing: No Slough/Fibrino Yes Wound Bed Granulation Amount: Small (1-33%) Exposed Structure Granulation Quality: Red Fascia Exposed: No Necrotic Amount: Large (67-100%) Fat Layer (Subcutaneous Tissue) Exposed: Yes Necrotic Quality: Adherent Slough Tendon Exposed: No Muscle Exposed: No Joint Exposed: No Bone Exposed: No Treatment Notes Wound #1 (Malleolus) Wound Laterality: Left, Lateral Cleanser Normal Saline Discharge Instruction: Wash your hands with soap and water. Remove old dressing, discard into plastic bag and place into trash. Cleanse the wound with Normal Saline prior to applying a clean dressing using gauze sponges, not tissues or cotton balls. Do not scrub or use excessive force. Pat dry using gauze sponges, not tissue or cotton balls. SHARDAY, MICHL ([id number]) Peri-Wound Care Topical Santyl Collagenase Ointment, 30 (gm), tube Discharge Instruction: Apply nickel thick  to wound bed only Primary  Dressing Gauze Discharge Instruction: Apply moist gauze to cover over santyl Secondary Dressing ABD Pad 5x9 (in/in) Discharge Instruction: Cover dressing Kerlix 4.5 x 4.1 (in/yd) Discharge Instruction: Apply Kerlix 4.5 x 4.1 (in/yd) as instructed Secured With 79M [place] Surgical Tape, 2x2 (in/yd) Discharge Instruction: Secure kerlix Compression Wrap Compression Stockings Add-Ons Electronic Signature(s) Signed: 04/14/2021 4:37:04 PM By: Tammy Coria RN Entered By: [name] on 04/14/2021 14:49:00 Hennigan, [place] Hall. ([id number]) -------------------------------------------------------------------------------- Vitals Details Patient Name: [name], Ligia Hall. Date of Service: 04/14/2021 2:30 PM Medical Record Number: [id number] Patient Account Number: [id number] Date of Birth/Sex: 04-28-1929 (87 y.o. F) Treating RN: [name] Primary Care Chantry Headen: [name] Other Clinician: Referring Chantay Whitelock: [name] Treating Cambree Hendrix/Extender: [name] in Treatment: 1 Vital Signs Time Taken: 14:40 Temperature (F): 98.1 Height (in): 62 Pulse (bpm): 97 Respiratory Rate (breaths/min): 18 Blood Pressure (mmHg): 163/70 Reference Range: 80 - 120 mg / dl Notes CBG 108 Electronic Signature(s) Signed: 04/14/2021 4:37:04 PM By: Tammy Coria RN Entered By: [name] on 04/14/2021 14:41:17

## 2021-04-15 ENCOUNTER — Ambulatory Visit: Payer: Medicare Other | Admitting: Internal Medicine

## 2021-04-15 NOTE — Telephone Encounter (Signed)
Patient was advised.  

## 2021-04-15 NOTE — Telephone Encounter (Signed)
Per Dr. Rosanna Randy we are to disregard this message.

## 2021-04-20 DIAGNOSIS — F32A Depression, unspecified: Secondary | ICD-10-CM | POA: Diagnosis not present

## 2021-04-20 DIAGNOSIS — I2510 Atherosclerotic heart disease of native coronary artery without angina pectoris: Secondary | ICD-10-CM | POA: Diagnosis not present

## 2021-04-20 DIAGNOSIS — S91012D Laceration without foreign body, left ankle, subsequent encounter: Secondary | ICD-10-CM | POA: Diagnosis not present

## 2021-04-20 DIAGNOSIS — I129 Hypertensive chronic kidney disease with stage 1 through stage 4 chronic kidney disease, or unspecified chronic kidney disease: Secondary | ICD-10-CM | POA: Diagnosis not present

## 2021-04-20 DIAGNOSIS — G4700 Insomnia, unspecified: Secondary | ICD-10-CM | POA: Diagnosis not present

## 2021-04-20 DIAGNOSIS — E1151 Type 2 diabetes mellitus with diabetic peripheral angiopathy without gangrene: Secondary | ICD-10-CM | POA: Diagnosis not present

## 2021-04-20 DIAGNOSIS — R1310 Dysphagia, unspecified: Secondary | ICD-10-CM | POA: Diagnosis not present

## 2021-04-20 DIAGNOSIS — K219 Gastro-esophageal reflux disease without esophagitis: Secondary | ICD-10-CM | POA: Diagnosis not present

## 2021-04-20 DIAGNOSIS — E1142 Type 2 diabetes mellitus with diabetic polyneuropathy: Secondary | ICD-10-CM | POA: Diagnosis not present

## 2021-04-20 DIAGNOSIS — D631 Anemia in chronic kidney disease: Secondary | ICD-10-CM | POA: Diagnosis not present

## 2021-04-20 DIAGNOSIS — J449 Chronic obstructive pulmonary disease, unspecified: Secondary | ICD-10-CM | POA: Diagnosis not present

## 2021-04-20 DIAGNOSIS — E7800 Pure hypercholesterolemia, unspecified: Secondary | ICD-10-CM | POA: Diagnosis not present

## 2021-04-20 DIAGNOSIS — M5136 Other intervertebral disc degeneration, lumbar region: Secondary | ICD-10-CM | POA: Diagnosis not present

## 2021-04-20 DIAGNOSIS — Z794 Long term (current) use of insulin: Secondary | ICD-10-CM | POA: Diagnosis not present

## 2021-04-20 DIAGNOSIS — Z85828 Personal history of other malignant neoplasm of skin: Secondary | ICD-10-CM | POA: Diagnosis not present

## 2021-04-20 DIAGNOSIS — N1831 Chronic kidney disease, stage 3a: Secondary | ICD-10-CM | POA: Diagnosis not present

## 2021-04-20 DIAGNOSIS — Z7951 Long term (current) use of inhaled steroids: Secondary | ICD-10-CM | POA: Diagnosis not present

## 2021-04-20 DIAGNOSIS — Z7982 Long term (current) use of aspirin: Secondary | ICD-10-CM | POA: Diagnosis not present

## 2021-04-20 DIAGNOSIS — E1122 Type 2 diabetes mellitus with diabetic chronic kidney disease: Secondary | ICD-10-CM | POA: Diagnosis not present

## 2021-04-20 DIAGNOSIS — I480 Paroxysmal atrial fibrillation: Secondary | ICD-10-CM | POA: Diagnosis not present

## 2021-04-20 DIAGNOSIS — Z7902 Long term (current) use of antithrombotics/antiplatelets: Secondary | ICD-10-CM | POA: Diagnosis not present

## 2021-04-21 ENCOUNTER — Other Ambulatory Visit: Payer: Self-pay

## 2021-04-21 ENCOUNTER — Encounter: Payer: Medicare Other | Admitting: Physician Assistant

## 2021-04-21 DIAGNOSIS — E11621 Type 2 diabetes mellitus with foot ulcer: Secondary | ICD-10-CM | POA: Diagnosis not present

## 2021-04-21 DIAGNOSIS — I872 Venous insufficiency (chronic) (peripheral): Secondary | ICD-10-CM | POA: Diagnosis not present

## 2021-04-21 DIAGNOSIS — T8133XA Disruption of traumatic injury wound repair, initial encounter: Secondary | ICD-10-CM | POA: Diagnosis not present

## 2021-04-21 DIAGNOSIS — S91012S Laceration without foreign body, left ankle, sequela: Secondary | ICD-10-CM | POA: Diagnosis not present

## 2021-04-21 DIAGNOSIS — I480 Paroxysmal atrial fibrillation: Secondary | ICD-10-CM | POA: Diagnosis not present

## 2021-04-21 DIAGNOSIS — E1140 Type 2 diabetes mellitus with diabetic neuropathy, unspecified: Secondary | ICD-10-CM | POA: Diagnosis not present

## 2021-04-21 DIAGNOSIS — T8131XA Disruption of external operation (surgical) wound, not elsewhere classified, initial encounter: Secondary | ICD-10-CM | POA: Diagnosis not present

## 2021-04-21 DIAGNOSIS — L97322 Non-pressure chronic ulcer of left ankle with fat layer exposed: Secondary | ICD-10-CM | POA: Diagnosis not present

## 2021-04-21 NOTE — Progress Notes (Addendum)
MARLANE, VAILLANT ([id number]) Visit Report for 04/21/2021 Arrival Information Details Patient Name: [name], [name]. Date of Service: 04/21/2021 1:30 PM Medical Record Number: [id number] Patient Account Number: [id number] Date of Birth/Sex: [birth year] (87 y.o. F) Treating RN: Carlene Coria Primary Care Bianna Haran: Miguel Aschoff Other Clinician: Referring Jacody Beneke: Miguel Aschoff Treating Sarahmarie Leavey/Extender: Skipper Cliche in Treatment: 2 Visit Information History Since Last Visit All ordered tests and consults were completed: No Patient Arrived: [name] Added or deleted any medications: No Arrival Time: 13:38 Any new allergies or adverse reactions: No Accompanied By: son Had a fall or experienced change in No Transfer Assistance: None activities of daily living that may affect Patient Identification Verified: Yes risk of falls: Secondary Verification Process Completed: Yes Signs or symptoms of abuse/neglect since last visito No Patient Requires Transmission-Based No Hospitalized since last visit: No Precautions: Implantable device outside of the clinic excluding No Patient Has Alerts: Yes cellular tissue based products placed in the center Patient Alerts: Patient on Blood Thinner since last visit: ***PLAVIX*** Has Dressing in Place as Prescribed: Yes ALLERGIC TO Has Compression in Place as Prescribed: Yes LIDOCAINE Pain Present Now: No Electronic Signature(s) Signed: 04/24/2021 4:32:50 PM By: Carlene Coria RN Entered By: Carlene Coria on 04/21/2021 13:43:44 Formoso, [place]ip[place]. ([id number]) -------------------------------------------------------------------------------- Clinic Level of Care Assessment Details Patient Name: [name], [name] Date of Service: 04/21/2021 1:30 PM Medical Record Number: [id number] Patient Account Number: [id number] Date of Birth/Sex: 11/18/1928 (87 y.o. F) Treating RN: Carlene Coria Primary Care Davionte Lusby: Miguel Aschoff Other  Clinician: Referring Jaquae Rieves: Miguel Aschoff Treating Coulton Schlink/Extender: Skipper Cliche in Treatment: 2 Clinic Level of Care Assessment Items TOOL 1 Quantity Score '[]'$  - Use when EandM and Procedure is performed on INITIAL visit 0 ASSESSMENTS - Nursing Assessment / Reassessment '[]'$  - General Physical Exam (combine w/ comprehensive assessment (listed just below) when performed on new 0 pt. evals) '[]'$  - 0 Comprehensive Assessment (HX, ROS, Risk Assessments, Wounds Hx, etc.) ASSESSMENTS - Wound and Skin Assessment / Reassessment '[]'$  - Dermatologic / Skin Assessment (not related to wound area) 0 ASSESSMENTS - Ostomy and/or Continence Assessment and Care '[]'$  - Incontinence Assessment and Management 0 '[]'$  - 0 Ostomy Care Assessment and Management (repouching, etc.) PROCESS - Coordination of Care '[]'$  - Simple Patient / Family Education for ongoing care 0 '[]'$  - 0 Complex (extensive) Patient / Family Education for ongoing care '[]'$  - 0 Staff obtains Programmer, systems, Records, Test Results / Process Orders '[]'$  - 0 Staff telephones HHA, Nursing Homes / Clarify orders / etc '[]'$  - 0 Routine Transfer to another Facility (non-emergent condition) '[]'$  - 0 Routine Hospital Admission (non-emergent condition) '[]'$  - 0 New Admissions / Biomedical engineer / Ordering NPWT, Apligraf, etc. '[]'$  - 0 Emergency Hospital Admission (emergent condition) PROCESS - Special Needs '[]'$  - Pediatric / Minor Patient Management 0 '[]'$  - 0 Isolation Patient Management '[]'$  - 0 Hearing / Language / Visual special needs '[]'$  - 0 Assessment of Community assistance (transportation, D/C planning, etc.) '[]'$  - 0 Additional assistance / Altered mentation '[]'$  - 0 Support Surface(s) Assessment (bed, cushion, seat, etc.) INTERVENTIONS - Miscellaneous '[]'$  - External ear exam 0 '[]'$  - 0 Patient Transfer (multiple staff / Civil Service fast streamer / Similar devices) '[]'$  - 0 Simple Staple / Suture removal (25 or less) '[]'$  - 0 Complex Staple / Suture removal  (26 or more) '[]'$  - 0 Hypo/Hyperglycemic Management (do not check if billed separately) '[]'$  - 0 Ankle / Brachial Index (ABI) - do not check if billed separately Has  the patient been seen at the hospital within the last three years: Yes Total Score: 0 Level Of Care: ____ [name] ([id number]) Electronic Signature(s) Signed: 04/24/2021 4:32:50 PM By: Carlene Coria RN Entered By: Carlene Coria on 04/21/2021 14:37:50 [place]field, Johnay L. ([id number]) -------------------------------------------------------------------------------- Compression Therapy Details Patient Name: [name], [name] Date of Service: 04/21/2021 1:30 PM Medical Record Number: [id number] Patient Account Number: [id number] Date of Birth/Sex: 1929/08/25 (87 y.o. F) Treating RN: Carlene Coria Primary Care Lashara Urey: Miguel Aschoff Other Clinician: Referring Brazil Voytko: Miguel Aschoff Treating Phillp Dolores/Extender: Skipper Cliche in Treatment: 2 Compression Therapy Performed for Wound Assessment: Wound #1 Left,Lateral Malleolus Performed By: Jake Church, RN Compression Type: Three Layer Post Procedure Diagnosis Same as Pre-procedure Electronic Signature(s) Signed: 04/24/2021 4:32:50 PM By: Carlene Coria RN Entered By: Carlene Coria on 04/21/2021 14:22:51 Balzarini, Quisha Carlean Jews ([id number]) -------------------------------------------------------------------------------- Encounter Discharge Information Details Patient Name: Tammy Neighbors, [name] Date of Service: 04/21/2021 1:30 PM Medical Record Number: [id number] Patient Account Number: [id number] Date of Birth/Sex: 1929-02-16 (87 y.o. F) Treating RN: Carlene Coria Primary Care Evie Crumpler: Miguel Aschoff Other Clinician: Referring Montay Vanvoorhis: Miguel Aschoff Treating Amdrew Oboyle/Extender: Skipper Cliche in Treatment: 2 Encounter Discharge Information Items Post Procedure Vitals Discharge Condition: Stable Temperature (F): 97.7 Ambulatory Status:  Walker Pulse (bpm): 101 Discharge Destination: Home Respiratory Rate (breaths/min): 18 Transportation: Private Auto Blood Pressure (mmHg): 176/78 Accompanied By: self Schedule Follow-up Appointment: Yes Clinical Summary of Care: Patient Declined Electronic Signature(s) Signed: 04/24/2021 4:32:50 PM By: Carlene Coria RN Entered By: Carlene Coria on 04/21/2021 14:38:59 Steuart, Augusta L. ([id number]) -------------------------------------------------------------------------------- Lower Extremity Assessment Details Patient Name: Havens, Mahi L. Date of Service: 04/21/2021 1:30 PM Medical Record Number: [id number] Patient Account Number: [id number] Date of Birth/Sex: 1928-11-23 (87 y.o. F) Treating RN: Carlene Coria Primary Care Mont Jagoda: Miguel Aschoff Other Clinician: Referring Catlin Aycock: Miguel Aschoff Treating Chenelle Benning/Extender: Jeri Cos Weeks in Treatment: 2 Edema Assessment Assessed: [Left: No] [Right: No] Edema: [Left: Ye] [Right: s] Vascular Assessment Pulses: Dorsalis Pedis Palpable: [Left:Yes] Electronic Signature(s) Signed: 04/24/2021 4:32:50 PM By: Carlene Coria RN Entered By: Carlene Coria on 04/21/2021 13:54:54 Roussel, Chasiti L. ([id number]) -------------------------------------------------------------------------------- Multi Wound Chart Details Patient Name: Husser, Harley L. Date of Service: 04/21/2021 1:30 PM Medical Record Number: [id number] Patient Account Number: [id number] Date of Birth/Sex: July 09, 1929 (87 y.o. F) Treating RN: Carlene Coria Primary Care Derris Millan: Miguel Aschoff Other Clinician: Referring Marvell Tamer: Miguel Aschoff Treating Yuli Lanigan/Extender: Skipper Cliche in Treatment: 2 Vital Signs Height(in): 26 Pulse(bpm): 101 Weight(lbs): Blood Pressure(mmHg): 176/78 Body Mass Index(BMI): Temperature(F): 97.7 Respiratory Rate(breaths/min): 18 Photos: [N/A:N/A] Wound Location: Left, Lateral Malleolus N/A N/A Wounding  Event: Surgical Injury N/A N/A Primary Etiology: Dehisced Wound N/A N/A Comorbid History: Chronic Obstructive Pulmonary N/A N/A Disease (COPD), Arrhythmia, Coronary Artery Disease, Hypertension, Type II Diabetes, Osteoarthritis, Neuropathy Date Acquired: 02/08/2021 N/A N/A Weeks of Treatment: 2 N/A N/A Wound Status: Open N/A N/A Measurements L x W x D (cm) 0.5x1.6x0.6 N/A N/A Area (cm) : 0.628 N/A N/A Volume (cm) : 0.377 N/A N/A % Reduction in Area: 33.30% N/A N/A % Reduction in Volume: 50.00% N/A N/A Classification: Full Thickness Without Exposed N/A N/A Support Structures Exudate Amount: Medium N/A N/A Exudate Type: Serosanguineous N/A N/A Exudate Color: red, brown N/A N/A Granulation Amount: Small (1-33%) N/A N/A Granulation Quality: Red N/A N/A Necrotic Amount: Large (67-100%) N/A N/A Exposed Structures: Fat Layer (Subcutaneous Tissue): N/A N/A Yes Fascia: No Tendon: No Muscle: No Joint: No Bone: No Epithelialization: None N/A N/A Treatment Notes Electronic Signature(s) Signed: 04/24/2021 4:32:50 PM By:  Carlene Coria RN Entered By: Carlene Coria on 04/21/2021 14:20:41 [name] ([id number]) -------------------------------------------------------------------------------- [place] Details Patient Name: Nienaber, Jeaninne L. Date of Service: 04/21/2021 1:30 PM Medical Record Number: [id number] Patient Account Number: [id number] Date of Birth/Sex: Nov 08, 1928 (87 y.o. F) Treating RN: Carlene Coria Primary Care Neasia Fleeman: Miguel Aschoff Other Clinician: Referring Taris Galindo: Miguel Aschoff Treating Novak Stgermaine/Extender: Skipper Cliche in Treatment: 2 Active Inactive Wound/Skin Impairment Nursing Diagnoses: Impaired tissue integrity Goals: Patient/caregiver will verbalize understanding of skin care regimen Date Initiated: 04/01/2021 Target Resolution Date: 05/02/2021 Goal Status: Active Ulcer/skin breakdown will have a volume reduction of  30% by week 4 Date Initiated: 04/01/2021 Target Resolution Date: 05/02/2021 Goal Status: Active Ulcer/skin breakdown will have a volume reduction of 50% by week 8 Date Initiated: 04/01/2021 Target Resolution Date: 06/02/2021 Goal Status: Active Ulcer/skin breakdown will have a volume reduction of 80% by week 12 Date Initiated: 04/01/2021 Target Resolution Date: 07/02/2021 Goal Status: Active Ulcer/skin breakdown will heal within 14 weeks Date Initiated: 04/01/2021 Target Resolution Date: 08/02/2021 Goal Status: Active Interventions: Assess patient/caregiver ability to obtain necessary supplies Assess patient/caregiver ability to perform ulcer/skin care regimen upon admission and as needed Assess ulceration(s) every visit Provide education on ulcer and skin care Treatment Activities: Patient referred to home care : 04/01/2021 Skin care regimen initiated : 04/01/2021 Notes: Electronic Signature(s) Signed: 04/24/2021 4:32:50 PM By: Carlene Coria RN Entered By: Carlene Coria on 04/21/2021 14:20:27 Deasis, [place]. ([id number]) -------------------------------------------------------------------------------- Pain Assessment Details Patient Name: Tellez, Katara L. Date of Service: 04/21/2021 1:30 PM Medical Record Number: [id number] Patient Account Number: [id number] Date of Birth/Sex: 06-25-1929 (87 y.o. F) Treating RN: Carlene Coria Primary Care Kewana Sanon: Miguel Aschoff Other Clinician: Referring Odelle Kosier: Miguel Aschoff Treating Riana Tessmer/Extender: Skipper Cliche in Treatment: 2 Active Problems Location of Pain Severity and Description of Pain Patient Has Paino No Site Locations Pain Management and Medication Current Pain Management: Electronic Signature(s) Signed: 04/24/2021 4:32:50 PM By: Carlene Coria RN Entered By: Carlene Coria on 04/21/2021 13:44:19 Leija, Kamylle Carlean Jews  ([id number]) -------------------------------------------------------------------------------- Patient/Caregiver Education Details Patient Name: Tammy Neighbors, Rayvin L. Date of Service: 04/21/2021 1:30 PM Medical Record Number: [id number] Patient Account Number: [id number] Date of Birth/Gender: 14-May-1929 (87 y.o. F) Treating RN: Carlene Coria Primary Care Physician: Miguel Aschoff Other Clinician: Referring Physician: Miguel Aschoff Treating Physician/Extender: Skipper Cliche in Treatment: 2 Education Assessment Education Provided To: Patient Education Topics Provided Wound/Skin Impairment: Methods: Explain/Verbal Responses: State content correctly Electronic Signature(s) Signed: 04/24/2021 4:32:50 PM By: Carlene Coria RN Entered By: Carlene Coria on 04/21/2021 14:38:09 Millan, Meliza L. ([id number]) -------------------------------------------------------------------------------- Wound Assessment Details Patient Name: Ayuso, Koriana L. Date of Service: 04/21/2021 1:30 PM Medical Record Number: [id number] Patient Account Number: [id number] Date of Birth/Sex: [birth year] (87 y.o. F) Treating RN: Carlene Coria Primary Care Annice Jolly: Miguel Aschoff Other Clinician: Referring Wylder Macomber: Miguel Aschoff Treating Milanni Ayub/Extender: Skipper Cliche in Treatment: 2 Wound Status Wound Number: 1 Primary Dehisced Wound Etiology: Wound Location: Left, Lateral Malleolus Wound Open Wounding Event: Surgical Injury Status: Date Acquired: 02/08/2021 Comorbid Chronic Obstructive Pulmonary Disease (COPD), Weeks Of Treatment: 2 History: Arrhythmia, Coronary Artery Disease, Hypertension, Type Clustered Wound: No II Diabetes, Osteoarthritis, Neuropathy Photos Wound Measurements Length: (cm) 0.5 Width: (cm) 1.6 Depth: (cm) 0.6 Area: (cm) 0.628 Volume: (cm) 0.377 % Reduction in Area: 33.3% % Reduction in Volume: 50% Epithelialization: None Tunneling: No Undermining: No Wound  Description Classification: Full Thickness Without Exposed Support Structures Exudate Amount: Medium Exudate Type: Serosanguineous Exudate Color: red, brown Foul Odor After Cleansing: No Slough/Fibrino Yes  Wound Bed Granulation Amount: Small (1-33%) Exposed Structure Granulation Quality: Red Fascia Exposed: No Necrotic Amount: Large (67-100%) Fat Layer (Subcutaneous Tissue) Exposed: Yes Necrotic Quality: Adherent Slough Tendon Exposed: No Muscle Exposed: No Joint Exposed: No Bone Exposed: No Treatment Notes Wound #1 (Malleolus) Wound Laterality: Left, Lateral Cleanser Normal Saline Discharge Instruction: Wash your hands with soap and water. Remove old dressing, discard into plastic bag and place into trash. Cleanse the wound with Normal Saline prior to applying a clean dressing using gauze sponges, not tissues or cotton balls. Do not scrub or use excessive force. Pat dry using gauze sponges, not tissue or cotton balls. LESLEYANN, RO ([id number]) Peri-Wound Care Topical Primary Dressing Hydrofera Blue Ready Transfer Foam, 4x5 (in/in) Discharge Instruction: Apply Hydrofera Blue Ready to wound bed as directed Secondary Dressing ABD Pad 5x9 (in/in) Discharge Instruction: Cover dressing Secured With Compression Wrap Profore Lite LF 3 Multilayer Compression [place] Discharge Instruction: Apply 3 multi-layer wrap as prescribed. Compression Stockings Add-Ons Electronic Signature(s) Signed: 04/24/2021 4:32:50 PM By: Carlene Coria RN Entered By: Carlene Coria on 04/21/2021 13:54:31 Coury, Mistina Carlean Jews ([id number]) -------------------------------------------------------------------------------- Vitals Details Patient Name: Tammy Neighbors, Jalesha L. Date of Service: 04/21/2021 1:30 PM Medical Record Number: [id number] Patient Account Number: [id number] Date of Birth/Sex: 1929/05/28 (87 y.o. F) Treating RN: Carlene Coria Primary Care Rindy Kollman: Miguel Aschoff Other  Clinician: Referring Joven Mom: Miguel Aschoff Treating Takiya Belmares/Extender: Skipper Cliche in Treatment: 2 Vital Signs Time Taken: 13:43 Temperature (F): 97.7 Height (in): 62 Pulse (bpm): 101 Respiratory Rate (breaths/min): 18 Blood Pressure (mmHg): 176/78 Reference Range: 80 - 120 mg / dl Electronic Signature(s) Signed: 04/24/2021 4:32:50 PM By: Carlene Coria RN Entered By: Carlene Coria on 04/21/2021 13:44:09

## 2021-04-21 NOTE — Progress Notes (Addendum)
Tammy, Hall ([id number]) Visit Report for 04/21/2021 Chief Complaint Document Details Patient Name: [name], [name] Date of Service: 04/21/2021 1:30 PM Medical Record Number: [id number] Patient Account Number: [id number] Date of Birth/Sex: Mar 22, 1929 (87 y.o. F) Treating RN: [name] Primary Care Provider: Miguel Hall Other Clinician: Referring Provider: Miguel Hall Treating Provider/Extender: [name] in Treatment: 2 Information Obtained from: Patient Chief Complaint Left ankle wounds Electronic Signature(s) Signed: 04/21/2021 1:32:59 PM By: Tammy Keeler PA-C Entered By: [name] on 04/21/2021 13:32:59 Wendell, [name] ([id number]) -------------------------------------------------------------------------------- Debridement Details Patient Name: [name], [name] Date of Service: 04/21/2021 1:30 PM Medical Record Number: [id number] Patient Account Number: [id number] Date of Birth/Sex: 1929-04-25 (87 y.o. F) Treating RN: [name] Primary Care Provider: Miguel Hall Other Clinician: Referring Provider: Miguel Hall Treating Provider/Extender: [name] in Treatment: 2 Debridement Performed for Wound #1 Left,Lateral Malleolus Assessment: Performed By: Physician Tammy Sams., PA-C Debridement Type: Debridement Level of Consciousness (Pre- Awake and Alert procedure): Pre-procedure Verification/Time Out Yes - 14:23 Taken: Start Time: 14:23 Total Area Debrided (L x W): 0.5 (cm) x 1.6 (cm) = 0.8 (cm) Tissue and other material Viable, Non-Viable, Slough, Subcutaneous, Skin: Dermis , Skin: Epidermis, Slough debrided: Level: Skin/Subcutaneous Tissue Debridement Description: Excisional Instrument: Curette Bleeding: Minimum Hemostasis Achieved: Pressure End Time: 14:25 Procedural Pain: 0 Post Procedural Pain: 0 Response to Treatment: Procedure was tolerated well Level of Consciousness (Post- Awake  and Alert procedure): Post Debridement Measurements of Total Wound Length: (cm) 0.5 Width: (cm) 1.6 Depth: (cm) 0.6 Volume: (cm) 0.377 Character of Wound/Ulcer Post Debridement: Improved Post Procedure Diagnosis Same as Pre-procedure Electronic Signature(s) Signed: 04/22/2021 5:04:06 PM By: Tammy Keeler PA-C Signed: 04/24/2021 4:32:50 PM By: Tammy Coria RN Entered By: [name] on 04/21/2021 14:24:26 [name], [name] ([id number]) -------------------------------------------------------------------------------- HPI Details Patient Name: [name], [name] Date of Service: 04/21/2021 1:30 PM Medical Record Number: [id number] Patient Account Number: [id number] Date of Birth/Sex: 09-30-1928 (87 y.o. F) Treating RN: [name] Primary Care Provider: Miguel Hall Other Clinician: Referring Provider: Miguel Hall Treating Provider/Extender: [name] in Treatment: 2 History of Present Illness HPI Description: Admission 7/6 [name] is a 87 year old female with a past medical history of paroxysmal A. fib not anticoagulated and type 2 diabetes on insulin that presents to the clinic for a 1-47-monthhistory of nonhealing wound to her left ankle. She states she tripped and fell while trying to pick up some food her neighbors left on her porch. She ended up cutting her left ankle. She went to that ED and had sutures placed. Unfortunately they did not stay in place and the wound dehisced. She has been using PolyMem silver to the wound. She currently denies signs of infection. 7/13; patient presents for 1 week follow-up. She has been using Santyl to the surgical wound site and silver alginate to the abrasion. The abrasion site has healed. She has no complaints today. She followed up with vein and vascular and had ABIs done. She states that the doctor mentioned that she has enough blood flow for wound healing. She denies signs of infection  today. 04/14/2021 upon evaluation today patient presents for reevaluation concerning issues that she has been having with her wound in the left lateral ankle region. Fortunately there does not appear to be any signs of infection which is great news. With that being said she is somewhat swollen and she also does have issues noted currently with necrotic tissue on the surface of the wound.  I think that we need to address that today to be honest. 04/21/2021 upon evaluation today patient appears to be doing decently well in regard to her foot ulcer. She has been tolerating the dressing changes without complication. There is a minimal amount of slough overall she seems to be making excellent progress. No fevers, chills, nausea, vomiting, or diarrhea. Electronic Signature(s) Signed: 04/21/2021 2:31:07 PM By: Tammy Keeler PA-C Entered By: [name] on 04/21/2021 14:31:07 [name], [name] ([id number]) -------------------------------------------------------------------------------- Physical Exam Details Patient Name: [name], [name] Date of Service: 04/21/2021 1:30 PM Medical Record Number: [id number] Patient Account Number: [id number] Date of Birth/Sex: June 02, 1929 (87 y.o. F) Treating RN: [name] Primary Care Provider: Miguel Hall Other Clinician: Referring Provider: Miguel Hall Treating Provider/Extender: [name] in Treatment: 2 Constitutional Well-nourished and well-hydrated in no acute distress. Respiratory normal breathing without difficulty. Psychiatric this patient is able to make decisions and demonstrates good insight into disease process. Alert and Oriented x 3. pleasant and cooperative. Notes Patient's wound bed showed signs of good granulation epithelization at this point. I do not see any evidence of infection which is great news and overall I am extremely pleased in general with where things stand. There does not appear to be any signs of  infection and overall I think that even though there was some slough I had to debride away today this is minimal compared to what we have seen in the past. Electronic Signature(s) Signed: 04/21/2021 2:37:26 PM By: Tammy Keeler PA-C Entered By: [name] on 04/21/2021 14:37:26 Kimoto, [name] ([id number]) -------------------------------------------------------------------------------- Physician Orders Details Patient Name: [name], [name] Date of Service: 04/21/2021 1:30 PM Medical Record Number: [id number] Patient Account Number: [id number] Date of Birth/Sex: Oct 30, 1928 (87 y.o. F) Treating RN: [name] Primary Care Provider: Miguel Hall Other Clinician: Referring Provider: Miguel Hall Treating Provider/Extender: [name] in Treatment: 2 Verbal / Phone Orders: No Diagnosis Coding ICD-10 Coding Code Description S91.012S Laceration without foreign body, left ankle, sequela L97.322 Non-pressure chronic ulcer of left ankle with fat layer exposed I87.2 Venous insufficiency (chronic) (peripheral) E11.621 Type 2 diabetes mellitus with foot ulcer Follow-up Appointments o Return Appointment in 1 week. o Nurse Visit as needed [facility]: [facility] for wound care. May utilize formulary equivalent dressing for wound treatment orders unless otherwise specified. Home Health Nurse may visit [facility] to address patientos wound care needs. [facility] if supplies unavailable do not remove dressing will change at next MD visit Bathing/ Shower/ Hygiene o Clean wound with Normal Saline or wound cleanser. o May shower with wound dressing protected with water repellent cover or cast protector. Edema Control - Lymphedema / Segmental Compressive Device / Other o Optional: One layer of unna paste to top of compression wrap (to act as an anchor). o Elevate, Exercise Daily and Avoid Standing for Long Periods  of Time. o Elevate legs to the level of the heart and pump ankles as often as possible o Elevate leg(s) parallel to the floor when sitting. Wound Treatment Wound #1 - Malleolus Wound Laterality: Left, Lateral Cleanser: Normal Saline 2 x Per Week/30 Days Discharge Instructions: Wash your hands with soap and water. Remove old dressing, discard into plastic bag and place into trash. Cleanse the wound with Normal Saline prior to applying a clean dressing using gauze sponges, not tissues or cotton balls. Do not scrub or use excessive force. Pat dry using gauze sponges, not tissue or  cotton balls. Primary Dressing: Hydrofera Blue Ready Transfer Foam, 4x5 (in/in) 2 x Per Week/30 Days Discharge Instructions: Apply Hydrofera Blue Ready to wound bed as directed Secondary Dressing: ABD Pad 5x9 (in/in) 2 x Per Week/30 Days Discharge Instructions: Cover dressing Compression Wrap: Profore Lite LF 3 Multilayer Compression Bandaging System 2 x Per Week/30 Days Discharge Instructions: Apply 3 multi-layer wrap as prescribed. Electronic Signature(s) Signed: 04/22/2021 5:04:06 PM By: Tammy Keeler PA-C Signed: 04/24/2021 4:32:50 PM By: Tammy Coria RN Entered By: [name] on 04/21/2021 14:25:10 [name], [name] ([id number]) -------------------------------------------------------------------------------- Problem List Details Patient Name: [name], [name] Date of Service: 04/21/2021 1:30 PM Medical Record Number: [id number] Patient Account Number: [id number] Date of Birth/Sex: [birth year] (87 y.o. F) Treating RN: [name] Primary Care Provider: Miguel Hall Other Clinician: Referring Provider: Miguel Hall Treating Provider/Extender: [name] in Treatment: 2 Active Problems ICD-10 Encounter Code Description Active Date MDM Diagnosis S91.012S Laceration without foreign body, left ankle, sequela 04/14/2021 No Yes L97.322 Non-pressure chronic ulcer of left ankle with  fat layer exposed 04/01/2021 No Yes I87.2 Venous insufficiency (chronic) (peripheral) 04/14/2021 No Yes E11.621 Type 2 diabetes mellitus with foot ulcer 04/01/2021 No Yes Inactive Problems Resolved Problems Electronic Signature(s) Signed: 04/21/2021 1:32:52 PM By: Tammy Keeler PA-C Entered By: [name] on 04/21/2021 13:32:52 [name], [name] ([id number]) -------------------------------------------------------------------------------- Progress Note Details Patient Name: [name], [name] Date of Service: 04/21/2021 1:30 PM Medical Record Number: [id number] Patient Account Number: [id number] Date of Birth/Sex: 1929-07-14 (87 y.o. F) Treating RN: [name] Primary Care Provider: Miguel Hall Other Clinician: Referring Provider: Miguel Hall Treating Provider/Extender: [name] in Treatment: 2 Subjective Chief Complaint Information obtained from Patient Left ankle wounds History of Present Illness (HPI) Admission 7/6 Ms. Muntaha Marrano is a 87 year old female with a past medical history of paroxysmal A. fib not anticoagulated and type 2 diabetes on insulin that presents to the clinic for a 1-74-monthhistory of nonhealing wound to her left ankle. She states she tripped and fell while trying to pick up some food her neighbors left on her porch. She ended up cutting her left ankle. She went to that ED and had sutures placed. Unfortunately they did not stay in place and the wound dehisced. She has been using PolyMem silver to the wound. She currently denies signs of infection. 7/13; patient presents for 1 week follow-up. She has been using Santyl to the surgical wound site and silver alginate to the abrasion. The abrasion site has healed. She has no complaints today. She followed up with vein and vascular and had ABIs done. She states that the doctor mentioned that she has enough blood flow for wound healing. She denies signs of infection today. 04/14/2021  upon evaluation today patient presents for reevaluation concerning issues that she has been having with her wound in the left lateral ankle region. Fortunately there does not appear to be any signs of infection which is great news. With that being said she is somewhat swollen and she also does have issues noted currently with necrotic tissue on the surface of the wound. I think that we need to address that today to be honest. 04/21/2021 upon evaluation today patient appears to be doing decently well in regard to her foot ulcer. She has been tolerating the dressing changes without complication. There is a minimal amount of slough overall she seems to be making excellent progress. No fevers, chills, nausea, vomiting, or diarrhea. Objective Constitutional Well-nourished and well-hydrated in no acute distress.  Vitals Time Taken: 1:43 PM, Height: 62 in, Temperature: 97.7 F, Pulse: 101 bpm, Respiratory Rate: 18 breaths/min, Blood Pressure: 176/78 mmHg. Respiratory normal breathing without difficulty. Psychiatric this patient is able to make decisions and demonstrates good insight into disease process. Alert and Oriented x 3. pleasant and cooperative. General Notes: Patient's wound bed showed signs of good granulation epithelization at this point. I do not see any evidence of infection which is great news and overall I am extremely pleased in general with where things stand. There does not appear to be any signs of infection and overall I think that even though there was some slough I had to debride away today this is minimal compared to what we have seen in the past. Integumentary (Hair, Skin) Wound #1 status is Open. Original cause of wound was Surgical Injury. The date acquired was: 02/08/2021. The wound has been in treatment 2 weeks. The wound is located on the Left,Lateral Malleolus. The wound measures 0.5cm length x 1.6cm width x 0.6cm depth; 0.628cm^2 area and 0.377cm^3 volume. There is Fat  Layer (Subcutaneous Tissue) exposed. There is no tunneling or undermining noted. There is a medium amount of serosanguineous drainage noted. There is small (1-33%) red granulation within the wound bed. There is a large (67-100%) amount of necrotic tissue within the wound bed including Adherent Slough. [name], [name] ([id number]) Assessment Active Problems ICD-10 Laceration without foreign body, left ankle, sequela Non-pressure chronic ulcer of left ankle with fat layer exposed Venous insufficiency (chronic) (peripheral) Type 2 diabetes mellitus with foot ulcer Procedures Wound #1 Pre-procedure diagnosis of Wound #1 is a Dehisced Wound located on the Left,Lateral Malleolus . There was a Excisional Skin/Subcutaneous Tissue Debridement with a total area of 0.8 sq cm performed by Tammy Sams., PA-C. With the following instrument(s): Curette to remove Viable and Non-Viable tissue/material. Material removed includes Subcutaneous Tissue, Slough, Skin: Dermis, and Skin: Epidermis. No specimens were taken. A time out was conducted at 14:23, prior to the start of the procedure. A Minimum amount of bleeding was controlled with Pressure. The procedure was tolerated well with a pain level of 0 throughout and a pain level of 0 following the procedure. Post Debridement Measurements: 0.5cm length x 1.6cm width x 0.6cm depth; 0.377cm^3 volume. Character of Wound/Ulcer Post Debridement is improved. Post procedure Diagnosis Wound #1: Same as Pre-Procedure Pre-procedure diagnosis of Wound #1 is a Dehisced Wound located on the Left,Lateral Malleolus . There was a Three Layer Compression Therapy Procedure by Tammy Coria, RN. Post procedure Diagnosis Wound #1: Same as Pre-Procedure Plan Follow-up Appointments: Return Appointment in 1 week. Nurse Visit as needed Home Health: [place] for wound care. May utilize formulary equivalent dressing for wound  treatment orders unless otherwise specified. Home Health Nurse may visit [facility] to address patient s wound care needs. [facility] if supplies unavailable do not remove dressing will change at next MD visit Bathing/ Shower/ Hygiene: Clean wound with Normal Saline or wound cleanser. May shower with wound dressing protected with water repellent cover or cast protector. Edema Control - Lymphedema / Segmental Compressive Device / Other: Optional: One layer of unna paste to top of compression wrap (to act as an anchor). Elevate, Exercise Daily and Avoid Standing for Long Periods of Time. Elevate legs to the level of the heart and pump ankles as often as possible Elevate leg(s) parallel to the floor when sitting. WOUND #1: - Malleolus Wound Laterality: Left, Lateral Cleanser: Normal Saline 2  x Per Week/30 Days Discharge Instructions: Wash your hands with soap and water. Remove old dressing, discard into plastic bag and place into trash. Cleanse the wound with Normal Saline prior to applying a clean dressing using gauze sponges, not tissues or cotton balls. Do not scrub or use excessive force. Pat dry using gauze sponges, not tissue or cotton balls. Primary Dressing: Hydrofera Blue Ready Transfer Foam, 4x5 (in/in) 2 x Per Week/30 Days Discharge Instructions: Apply Hydrofera Blue Ready to wound bed as directed Secondary Dressing: ABD Pad 5x9 (in/in) 2 x Per Week/30 Days Discharge Instructions: Cover dressing Compression Wrap: Profore Lite LF 3 Multilayer Compression Bandaging System 2 x Per Week/30 Days Discharge Instructions: Apply 3 multi-layer wrap as prescribed. 1. Would recommend currently that we going continue with the wound care measures as before using the [facility] think that still the best way to go at this point. 2. I am also can recommend the patient continue to use the 3 layer compression wrap I think that still the best way to go as well and she is in agreement with that  plan. Doebler, Jeffifer L. ([id number]) We will see patient back for reevaluation in 1 week here in the clinic. If anything worsens or changes patient will contact our office for additional recommendations. Electronic Signature(s) Signed: 04/21/2021 2:38:00 PM By: Tammy Keeler PA-C Entered By: [name] on 04/21/2021 14:38:00 Schumpert, [name] ([id number]) -------------------------------------------------------------------------------- SuperBill Details Patient Name: [name] Date of Service: 04/21/2021 Medical Record Number: [id number] Patient Account Number: [id number] Date of Birth/Sex: October 23, 1928 (87 y.o. F) Treating RN: [name] Primary Care Provider: Miguel Hall Other Clinician: Referring Provider: Miguel Hall Treating Provider/Extender: [name] in Treatment: 2 Diagnosis Coding ICD-10 Codes Code Description [phone] Laceration without foreign body, left ankle, sequela L97.322 Non-pressure chronic ulcer of left ankle with fat layer exposed I87.2 Venous insufficiency (chronic) (peripheral) E11.621 Type 2 diabetes mellitus with foot ulcer Facility Procedures CPT4 Code: [id number] Description: [id number] - DEB SUBQ TISSUE 20 SQ CM/< Modifier: Quantity: 1 CPT4 Code: Description: ICD-10 Diagnosis Description [id number] Non-pressure chronic ulcer of left ankle with fat layer exposed Modifier: Quantity: Physician Procedures CPT4 Code: [id number] Description: 11042 - WC PHYS SUBQ TISS 20 SQ CM Modifier: Quantity: 1 CPT4 Code: Description: ICD-10 Diagnosis Description [id number] Non-pressure chronic ulcer of left ankle with fat layer exposed Modifier: Quantity: Electronic Signature(s) Signed: 04/21/2021 2:38:17 PM By: Tammy Keeler PA-C Entered By: [name] on 04/21/2021 14:38:16

## 2021-04-24 DIAGNOSIS — F32A Depression, unspecified: Secondary | ICD-10-CM | POA: Diagnosis not present

## 2021-04-24 DIAGNOSIS — E1122 Type 2 diabetes mellitus with diabetic chronic kidney disease: Secondary | ICD-10-CM | POA: Diagnosis not present

## 2021-04-24 DIAGNOSIS — S91012D Laceration without foreign body, left ankle, subsequent encounter: Secondary | ICD-10-CM | POA: Diagnosis not present

## 2021-04-24 DIAGNOSIS — E7800 Pure hypercholesterolemia, unspecified: Secondary | ICD-10-CM | POA: Diagnosis not present

## 2021-04-24 DIAGNOSIS — Z794 Long term (current) use of insulin: Secondary | ICD-10-CM | POA: Diagnosis not present

## 2021-04-24 DIAGNOSIS — J449 Chronic obstructive pulmonary disease, unspecified: Secondary | ICD-10-CM | POA: Diagnosis not present

## 2021-04-24 DIAGNOSIS — E1142 Type 2 diabetes mellitus with diabetic polyneuropathy: Secondary | ICD-10-CM | POA: Diagnosis not present

## 2021-04-24 DIAGNOSIS — R1310 Dysphagia, unspecified: Secondary | ICD-10-CM | POA: Diagnosis not present

## 2021-04-24 DIAGNOSIS — N1831 Chronic kidney disease, stage 3a: Secondary | ICD-10-CM | POA: Diagnosis not present

## 2021-04-24 DIAGNOSIS — Z85828 Personal history of other malignant neoplasm of skin: Secondary | ICD-10-CM | POA: Diagnosis not present

## 2021-04-24 DIAGNOSIS — I480 Paroxysmal atrial fibrillation: Secondary | ICD-10-CM | POA: Diagnosis not present

## 2021-04-24 DIAGNOSIS — M5136 Other intervertebral disc degeneration, lumbar region: Secondary | ICD-10-CM | POA: Diagnosis not present

## 2021-04-24 DIAGNOSIS — I129 Hypertensive chronic kidney disease with stage 1 through stage 4 chronic kidney disease, or unspecified chronic kidney disease: Secondary | ICD-10-CM | POA: Diagnosis not present

## 2021-04-24 DIAGNOSIS — Z7902 Long term (current) use of antithrombotics/antiplatelets: Secondary | ICD-10-CM | POA: Diagnosis not present

## 2021-04-24 DIAGNOSIS — I2510 Atherosclerotic heart disease of native coronary artery without angina pectoris: Secondary | ICD-10-CM | POA: Diagnosis not present

## 2021-04-24 DIAGNOSIS — E1151 Type 2 diabetes mellitus with diabetic peripheral angiopathy without gangrene: Secondary | ICD-10-CM | POA: Diagnosis not present

## 2021-04-24 DIAGNOSIS — Z7951 Long term (current) use of inhaled steroids: Secondary | ICD-10-CM | POA: Diagnosis not present

## 2021-04-24 DIAGNOSIS — D631 Anemia in chronic kidney disease: Secondary | ICD-10-CM | POA: Diagnosis not present

## 2021-04-24 DIAGNOSIS — K219 Gastro-esophageal reflux disease without esophagitis: Secondary | ICD-10-CM | POA: Diagnosis not present

## 2021-04-24 DIAGNOSIS — G4700 Insomnia, unspecified: Secondary | ICD-10-CM | POA: Diagnosis not present

## 2021-04-24 DIAGNOSIS — Z7982 Long term (current) use of aspirin: Secondary | ICD-10-CM | POA: Diagnosis not present

## 2021-04-27 ENCOUNTER — Ambulatory Visit: Payer: Medicare Other | Admitting: Family Medicine

## 2021-04-27 ENCOUNTER — Inpatient Hospital Stay: Payer: Medicare Other | Admitting: Family Medicine

## 2021-04-28 DIAGNOSIS — D631 Anemia in chronic kidney disease: Secondary | ICD-10-CM | POA: Diagnosis not present

## 2021-04-28 DIAGNOSIS — Z794 Long term (current) use of insulin: Secondary | ICD-10-CM | POA: Diagnosis not present

## 2021-04-28 DIAGNOSIS — N1831 Chronic kidney disease, stage 3a: Secondary | ICD-10-CM | POA: Diagnosis not present

## 2021-04-28 DIAGNOSIS — I480 Paroxysmal atrial fibrillation: Secondary | ICD-10-CM | POA: Diagnosis not present

## 2021-04-28 DIAGNOSIS — Z7951 Long term (current) use of inhaled steroids: Secondary | ICD-10-CM | POA: Diagnosis not present

## 2021-04-28 DIAGNOSIS — S91012D Laceration without foreign body, left ankle, subsequent encounter: Secondary | ICD-10-CM | POA: Diagnosis not present

## 2021-04-28 DIAGNOSIS — E1142 Type 2 diabetes mellitus with diabetic polyneuropathy: Secondary | ICD-10-CM | POA: Diagnosis not present

## 2021-04-28 DIAGNOSIS — E7800 Pure hypercholesterolemia, unspecified: Secondary | ICD-10-CM | POA: Diagnosis not present

## 2021-04-28 DIAGNOSIS — J449 Chronic obstructive pulmonary disease, unspecified: Secondary | ICD-10-CM | POA: Diagnosis not present

## 2021-04-28 DIAGNOSIS — I2510 Atherosclerotic heart disease of native coronary artery without angina pectoris: Secondary | ICD-10-CM | POA: Diagnosis not present

## 2021-04-28 DIAGNOSIS — F32A Depression, unspecified: Secondary | ICD-10-CM | POA: Diagnosis not present

## 2021-04-28 DIAGNOSIS — E1122 Type 2 diabetes mellitus with diabetic chronic kidney disease: Secondary | ICD-10-CM | POA: Diagnosis not present

## 2021-04-28 DIAGNOSIS — M5136 Other intervertebral disc degeneration, lumbar region: Secondary | ICD-10-CM | POA: Diagnosis not present

## 2021-04-28 DIAGNOSIS — Z85828 Personal history of other malignant neoplasm of skin: Secondary | ICD-10-CM | POA: Diagnosis not present

## 2021-04-28 DIAGNOSIS — Z7982 Long term (current) use of aspirin: Secondary | ICD-10-CM | POA: Diagnosis not present

## 2021-04-28 DIAGNOSIS — K219 Gastro-esophageal reflux disease without esophagitis: Secondary | ICD-10-CM | POA: Diagnosis not present

## 2021-04-28 DIAGNOSIS — I129 Hypertensive chronic kidney disease with stage 1 through stage 4 chronic kidney disease, or unspecified chronic kidney disease: Secondary | ICD-10-CM | POA: Diagnosis not present

## 2021-04-28 DIAGNOSIS — Z7902 Long term (current) use of antithrombotics/antiplatelets: Secondary | ICD-10-CM | POA: Diagnosis not present

## 2021-04-28 DIAGNOSIS — G4700 Insomnia, unspecified: Secondary | ICD-10-CM | POA: Diagnosis not present

## 2021-04-28 DIAGNOSIS — R1310 Dysphagia, unspecified: Secondary | ICD-10-CM | POA: Diagnosis not present

## 2021-04-28 DIAGNOSIS — E1151 Type 2 diabetes mellitus with diabetic peripheral angiopathy without gangrene: Secondary | ICD-10-CM | POA: Diagnosis not present

## 2021-04-30 ENCOUNTER — Telehealth: Payer: Self-pay

## 2021-04-30 ENCOUNTER — Other Ambulatory Visit: Payer: Self-pay

## 2021-04-30 ENCOUNTER — Encounter: Payer: Medicare Other | Attending: Physician Assistant | Admitting: Physician Assistant

## 2021-04-30 DIAGNOSIS — L97322 Non-pressure chronic ulcer of left ankle with fat layer exposed: Secondary | ICD-10-CM | POA: Diagnosis not present

## 2021-04-30 DIAGNOSIS — W010XXD Fall on same level from slipping, tripping and stumbling without subsequent striking against object, subsequent encounter: Secondary | ICD-10-CM | POA: Diagnosis not present

## 2021-04-30 DIAGNOSIS — T8131XA Disruption of external operation (surgical) wound, not elsewhere classified, initial encounter: Secondary | ICD-10-CM | POA: Diagnosis not present

## 2021-04-30 DIAGNOSIS — I480 Paroxysmal atrial fibrillation: Secondary | ICD-10-CM | POA: Diagnosis not present

## 2021-04-30 DIAGNOSIS — Z794 Long term (current) use of insulin: Secondary | ICD-10-CM | POA: Diagnosis not present

## 2021-04-30 DIAGNOSIS — L97822 Non-pressure chronic ulcer of other part of left lower leg with fat layer exposed: Secondary | ICD-10-CM | POA: Diagnosis not present

## 2021-04-30 DIAGNOSIS — L97329 Non-pressure chronic ulcer of left ankle with unspecified severity: Secondary | ICD-10-CM | POA: Diagnosis present

## 2021-04-30 DIAGNOSIS — E11622 Type 2 diabetes mellitus with other skin ulcer: Secondary | ICD-10-CM | POA: Insufficient documentation

## 2021-04-30 DIAGNOSIS — I872 Venous insufficiency (chronic) (peripheral): Secondary | ICD-10-CM | POA: Diagnosis not present

## 2021-04-30 DIAGNOSIS — S91012D Laceration without foreign body, left ankle, subsequent encounter: Secondary | ICD-10-CM | POA: Diagnosis not present

## 2021-04-30 NOTE — Progress Notes (Signed)
HOLLYN, STUCKY ([id number]) Visit Report for 04/30/2021 Arrival Information Details Patient Name: BRIZZA, NATHANSON. Date of Service: 04/30/2021 8:00 AM Medical Record Number: [id number] Patient Account Number: [id number] Date of Birth/Sex: November 03, 1928 (87 y.o. F) Treating RN: Donnamarie Poag Primary Care Arijana Narayan: Miguel Aschoff Other Clinician: Referring Charmagne Buhl: Miguel Aschoff Treating Aubrionna Istre/Extender: Skipper Cliche in Treatment: 4 Visit Information History Since Last Visit Added or deleted any medications: No Patient Arrived: [name] Had a fall or experienced change in No Arrival Time: 08:12 activities of daily living that may affect Accompanied By: son risk of falls: Transfer Assistance: None Hospitalized since last visit: No Patient Identification Verified: Yes Has Dressing in Place as Prescribed: Yes Secondary Verification Process Completed: Yes Has Compression in Place as Prescribed: Yes Patient Requires Transmission-Based No Pain Present Now: No Precautions: Patient Has Alerts: Yes Patient Alerts: Patient on Blood Thinner ***PLAVIX*** ALLERGIC TO LIDOCAINE Electronic Signature(s) Signed: 04/30/2021 8:52:54 AM By: Donnamarie Poag Entered By: Donnamarie Poag on 04/30/2021 08:17:49 Krus, Kaytlynne Carlean Jews ([id number]) -------------------------------------------------------------------------------- Clinic Level of Care Assessment Details Patient Name: Shafer, Essica L. Date of Service: 04/30/2021 8:00 AM Medical Record Number: [id number] Patient Account Number: [id number] Date of Birth/Sex: 09/30/1928 (87 y.o. F) Treating RN: Donnamarie Poag Primary Care Yakira Duquette: Miguel Aschoff Other Clinician: Referring Nevena Rozenberg: Miguel Aschoff Treating Jo Cerone/Extender: Skipper Cliche in Treatment: 4 Clinic Level of Care Assessment Items TOOL 1 Quantity Score [] - Use when EandM and Procedure is performed on INITIAL visit 0 ASSESSMENTS - Nursing Assessment /  Reassessment [] - General Physical Exam (combine w/ comprehensive assessment (listed just below) when performed on new 0 pt. evals) [] - 0 Comprehensive Assessment (HX, ROS, Risk Assessments, Wounds Hx, etc.) ASSESSMENTS - Wound and Skin Assessment / Reassessment [] - Dermatologic / Skin Assessment (not related to wound area) 0 ASSESSMENTS - Ostomy and/or Continence Assessment and Care [] - Incontinence Assessment and Management 0 [] - 0 Ostomy Care Assessment and Management (repouching, etc.) PROCESS - Coordination of Care [] - Simple Patient / Family Education for ongoing care 0 [] - 0 Complex (extensive) Patient / Family Education for ongoing care [] - 0 Staff obtains Consents, Records, Test Results / Process Orders [] - 0 Staff telephones HHA, Nursing Homes / Clarify orders / etc [] - 0 Routine Transfer to another Facility (non-emergent condition) [] - 0 Routine Hospital Admission (non-emergent condition) [] - 0 New Admissions / Biomedical engineer / Ordering NPWT, Apligraf, etc. [] - 0 Emergency Hospital Admission (emergent condition) PROCESS - Special Needs [] - Pediatric / Minor Patient Management 0 [] - 0 Isolation Patient Management [] - 0 Hearing / Language / Visual special needs [] - 0 Assessment of Community assistance (transportation, D/C planning, etc.) [] - 0 Additional assistance / Altered mentation [] - 0 Support Surface(s) Assessment (bed, cushion, seat, etc.) INTERVENTIONS - Miscellaneous [] - External ear exam 0 [] - 0 Patient Transfer (multiple staff / Civil Service fast streamer / Similar devices) [] - 0 Simple Staple / Suture removal (25 or less) [] - 0 Complex Staple / Suture removal (26 or more) [] - 0 Hypo/Hyperglycemic Management (do not check if billed separately) [] - 0 Ankle / Brachial Index (ABI) - do not check if billed separately Has the patient been seen at the hospital within the last three years: Yes Total Score: 0 Level Of Care:  ____ [name] ([id number]) Electronic Signature(s) Signed: 04/30/2021 8:52:54 AM By: Donnamarie Poag Entered By: Donnamarie Poag on 04/30/2021 08:50:54 Shibley, Lavina L. ([id number]) --------------------------------------------------------------------------------  Compression Therapy Details Patient Name: Michael, Tamelia L. Date of Service: 04/30/2021 8:00 AM Medical Record Number: [id number] Patient Account Number: [id number] Date of Birth/Sex: 06-19-1929 (87 y.o. F) Treating RN: Donnamarie Poag Primary Care Jaymes Hang: Miguel Aschoff Other Clinician: Referring Alastor Kneale: Miguel Aschoff Treating Mansour Balboa/Extender: Skipper Cliche in Treatment: 4 Compression Therapy Performed for Wound Assessment: Wound #1 Left,Lateral Malleolus Performed By: Junius Argyle, RN Compression Type: Three Layer Post Procedure Diagnosis Same as Pre-procedure Electronic Signature(s) Signed: 04/30/2021 8:52:54 AM By: Donnamarie Poag Entered By: Donnamarie Poag on 04/30/2021 08:47:19 Otani, Nathanial Rancher ([id number]) -------------------------------------------------------------------------------- Encounter Discharge Information Details Patient Name: [name], Latausha L. Date of Service: 04/30/2021 8:00 AM Medical Record Number: [id number] Patient Account Number: [id number] Date of Birth/Sex: 05-Apr-1929 (87 y.o. F) Treating RN: Donnamarie Poag Primary Care Domonic Hiscox: Miguel Aschoff Other Clinician: Referring Jenavive Lamboy: Miguel Aschoff Treating Undray Allman/Extender: Skipper Cliche in Treatment: 4 Encounter Discharge Information Items Discharge Condition: Stable Ambulatory Status: Walker Discharge Destination: Home Transportation: Other Accompanied ByPandora Leiter Schedule Follow-up Appointment: Yes Clinical Summary of Care: Electronic Signature(s) Signed: 04/30/2021 8:52:54 AM By: Donnamarie Poag Entered By: Donnamarie Poag on 04/30/2021 08:52:26 Jaco, Valeree L.  ([id number]) -------------------------------------------------------------------------------- Lower Extremity Assessment Details Patient Name: Custer, Jannell L. Date of Service: 04/30/2021 8:00 AM Medical Record Number: [id number] Patient Account Number: [id number] Date of Birth/Sex: 1928/11/29 (87 y.o. F) Treating RN: Donnamarie Poag Primary Care Dung Prien: Miguel Aschoff Other Clinician: Referring Keyen Marban: Miguel Aschoff Treating Brockton Mckesson/Extender: Jeri Cos Weeks in Treatment: 4 Edema Assessment Assessed: [Left: Yes] [Right: No] [Left: Edema] [Right: :] Calf Left: Right: Point of Measurement: 31 cm From Medial Instep 30 cm Ankle Left: Right: Point of Measurement: 10 cm From Medial Instep 18 cm Knee To Floor Left: Right: From Medial Instep 38 cm Vascular Assessment Pulses: Dorsalis Pedis Palpable: [Left:Yes] Electronic Signature(s) Signed: 04/30/2021 8:52:54 AM By: Donnamarie Poag Entered By: Donnamarie Poag on 04/30/2021 08:25:16 Sarris, Shakela L. ([id number]) -------------------------------------------------------------------------------- Multi Wound Chart Details Patient Name: Lengel, Thatiana L. Date of Service: 04/30/2021 8:00 AM Medical Record Number: [id number] Patient Account Number: [id number] Date of Birth/Sex: 22-Jun-1929 (87 y.o. F) Treating RN: Donnamarie Poag Primary Care Rolla Servidio: Miguel Aschoff Other Clinician: Referring Lesley Galentine: Miguel Aschoff Treating Recie Cirrincione/Extender: Skipper Cliche in Treatment: 4 Vital Signs Height(in): 71 Pulse(bpm): 29 Weight(lbs): Blood Pressure(mmHg): 171/84 Body Mass Index(BMI): Temperature(F): 98.2 Respiratory Rate(breaths/min): 18 Photos: [N/A:N/A] Wound Location: Left, Lateral Malleolus N/A N/A Wounding Event: Surgical Injury N/A N/A Primary Etiology: Dehisced Wound N/A N/A Comorbid History: Chronic Obstructive Pulmonary N/A N/A Disease (COPD), Arrhythmia, Coronary Artery Disease, Hypertension, Type II  Diabetes, Osteoarthritis, Neuropathy Date Acquired: 02/08/2021 N/A N/A Weeks of Treatment: 4 N/A N/A Wound Status: Open N/A N/A Measurements L x W x D (cm) 0.3x1.5x0.5 N/A N/A Area (cm) : 0.353 N/A N/A Volume (cm) : 0.177 N/A N/A % Reduction in Area: 62.50% N/A N/A % Reduction in Volume: 76.50% N/A N/A Classification: Full Thickness Without Exposed N/A N/A Support Structures Exudate Amount: Medium N/A N/A Exudate Type: Serosanguineous N/A N/A Exudate Color: red, brown N/A N/A Granulation Amount: Medium (34-66%) N/A N/A Granulation Quality: Red, Pink N/A N/A Necrotic Amount: Medium (34-66%) N/A N/A Exposed Structures: Fat Layer (Subcutaneous Tissue): N/A N/A Yes Fascia: No Tendon: No Muscle: No Joint: No Bone: No Epithelialization: None N/A N/A Treatment Notes Electronic Signature(s) Signed: 04/30/2021 8:52:54 AM By: Donnamarie Poag Entered By: Donnamarie Poag on 04/30/2021 08:27:38 Stiger, Nathanial Rancher ([id number]) -------------------------------------------------------------------------------- Cache Details Patient Name: [name], Margarete L. Date of Service: 04/30/2021 8:00 AM Medical Record Number: [id number]  Patient Account Number: [id number] Date of Birth/Sex: 03/10/1929 (87 y.o. F) Treating RN: Donnamarie Poag Primary Care : Miguel Aschoff Other Clinician: Referring : Miguel Aschoff Treating /Extender: Skipper Cliche in Treatment: 4 Active Inactive Wound/Skin Impairment Nursing Diagnoses: Impaired tissue integrity Goals: Patient/caregiver will verbalize understanding of skin care regimen Date Initiated: 04/01/2021 Date Inactivated: 04/30/2021 Target Resolution Date: 05/02/2021 Goal Status: Met Ulcer/skin breakdown will have a volume reduction of 30% by week 4 Date Initiated: 04/01/2021 Target Resolution Date: 05/02/2021 Goal Status: Active Ulcer/skin breakdown will have a volume reduction of 50% by week 8 Date Initiated:  04/01/2021 Target Resolution Date: 06/02/2021 Goal Status: Active Ulcer/skin breakdown will have a volume reduction of 80% by week 12 Date Initiated: 04/01/2021 Target Resolution Date: 07/02/2021 Goal Status: Active Ulcer/skin breakdown will heal within 14 weeks Date Initiated: 04/01/2021 Target Resolution Date: 08/02/2021 Goal Status: Active Interventions: Assess patient/caregiver ability to obtain necessary supplies Assess patient/caregiver ability to perform ulcer/skin care regimen upon admission and as needed Assess ulceration(s) every visit Provide education on ulcer and skin care Treatment Activities: Patient referred to home care : 04/01/2021 Skin care regimen initiated : 04/01/2021 Notes: Electronic Signature(s) Signed: 04/30/2021 8:52:54 AM By: Donnamarie Poag Entered By: Donnamarie Poag on 04/30/2021 08:26:59 Wisdom, Tallula L. ([id number]) -------------------------------------------------------------------------------- Pain Assessment Details Patient Name: Riordan, Tiasha L. Date of Service: 04/30/2021 8:00 AM Medical Record Number: [id number] Patient Account Number: [id number] Date of Birth/Sex: 01-13-1929 (87 y.o. F) Treating RN: Donnamarie Poag Primary Care : Miguel Aschoff Other Clinician: Referring : Miguel Aschoff Treating /Extender: Skipper Cliche in Treatment: 4 Active Problems Location of Pain Severity and Description of Pain Patient Has Paino No Site Locations Rate the pain. Current Pain Level: 0 Pain Management and Medication Current Pain Management: Electronic Signature(s) Signed: 04/30/2021 8:52:54 AM By: Donnamarie Poag Entered By: Donnamarie Poag on 04/30/2021 08:18:18 Leikam, Nathanial Rancher ([id number]) -------------------------------------------------------------------------------- Patient/Caregiver Education Details Patient Name: [name], Sachiko L. Date of Service: 04/30/2021 8:00 AM Medical Record Number: [id number] Patient Account Number:  [id number] Date of Birth/Gender: September 26, 1929 (87 y.o. F) Treating RN: Donnamarie Poag Primary Care Physician: Miguel Aschoff Other Clinician: Referring Physician: Miguel Aschoff Treating Physician/Extender: Skipper Cliche in Treatment: 4 Education Assessment Education Provided To: Patient and Caregiver Education Topics Provided Basic Hygiene: Wound/Skin Impairment: Electronic Signature(s) Signed: 04/30/2021 8:52:54 AM By: Donnamarie Poag Entered By: Donnamarie Poag on 04/30/2021 08:51:14 Baumert, Cyndel Carlean Jews ([id number]) -------------------------------------------------------------------------------- Wound Assessment Details Patient Name: Challis, Tava L. Date of Service: 04/30/2021 8:00 AM Medical Record Number: [id number] Patient Account Number: [id number] Date of Birth/Sex: November 02, 1928 (87 y.o. F) Treating RN: Donnamarie Poag Primary Care : Miguel Aschoff Other Clinician: Referring : Miguel Aschoff Treating /Extender: Skipper Cliche in Treatment: 4 Wound Status Wound Number: 1 Primary Dehisced Wound Etiology: Wound Location: Left, Lateral Malleolus Wound Open Wounding Event: Surgical Injury Status: Date Acquired: 02/08/2021 Comorbid Chronic Obstructive Pulmonary Disease (COPD), Weeks Of Treatment: 4 History: Arrhythmia, Coronary Artery Disease, Hypertension, Type Clustered Wound: No II Diabetes, Osteoarthritis, Neuropathy Photos Wound Measurements Length: (cm) 0.3 Width: (cm) 1.5 Depth: (cm) 0.5 Area: (cm) 0.353 Volume: (cm) 0.177 % Reduction in Area: 62.5% % Reduction in Volume: 76.5% Epithelialization: None Tunneling: No Undermining: No Wound Description Classification: Full Thickness Without Exposed Support Structures Exudate Amount: Medium Exudate Type: Serosanguineous Exudate Color: red, brown Foul Odor After Cleansing: No Slough/Fibrino Yes Wound Bed Granulation Amount: Medium (34-66%) Exposed Structure Granulation Quality:  Red, Pink Fascia Exposed: No Necrotic Amount: Medium (34-66%) Fat Layer (Subcutaneous Tissue) Exposed: Yes Necrotic Quality: Adherent Slough  Tendon Exposed: No Muscle Exposed: No Joint Exposed: No Bone Exposed: No Treatment Notes Wound #1 (Malleolus) Wound Laterality: Left, Lateral Cleanser Normal Saline Discharge Instruction: Wash your hands with soap and water. Remove old dressing, discard into plastic bag and place into trash. Cleanse the wound with Normal Saline prior to applying a clean dressing using gauze sponges, not tissues or cotton balls. Do not scrub or use excessive force. Pat dry using gauze sponges, not tissue or cotton balls. BETHANN, QUALLEY L. ([id number]) Peri-Wound Care Topical Primary Dressing Prisma 4.34 (in) Discharge Instruction: COLLAGEN-Moisten w/normal saline or sterile water; Cover wound as directed. Do not remove from wound bed. Secondary Dressing ABD Pad 5x9 (in/in) Discharge Instruction: Cover dressing Secured With Compression Wrap Profore Lite LF 3 Multilayer Compression Bandaging System Discharge Instruction: Apply 3 multi-layer wrap as prescribed. Compression Stockings Add-Ons Electronic Signature(s) Signed: 04/30/2021 8:52:54 AM By: Donnamarie Poag Entered By: Donnamarie Poag on 04/30/2021 08:23:29 Beaubien, Nathanial Rancher ([id number]) -------------------------------------------------------------------------------- Vitals Details Patient Name: Hoagland, Paislei L. Date of Service: 04/30/2021 8:00 AM Medical Record Number: [id number] Patient Account Number: [id number] Date of Birth/Sex: 1928-11-12 (87 y.o. F) Treating RN: Donnamarie Poag Primary Care : Miguel Aschoff Other Clinician: Referring : Miguel Aschoff Treating /Extender: Skipper Cliche in Treatment: 4 Vital Signs Time Taken: 08:15 Temperature (F): 98.2 Height (in): 62 Pulse (bpm): 94 Respiratory Rate (breaths/min): 18 Blood Pressure (mmHg):  171/84 Reference Range: 80 - 120 mg / dl Electronic Signature(s) Signed: 04/30/2021 8:52:54 AM By: Donnamarie Poag Entered ByDonnamarie Poag on 04/30/2021 08:18:11

## 2021-04-30 NOTE — Progress Notes (Addendum)
ISABEL, VANDERGRIFT ([id number]) Visit Report for 04/30/2021 Chief Complaint Document Details Patient Name: [name], [name] Date of Service: 04/30/2021 8:00 AM Medical Record Number: [id number] Patient Account Number: [id number] Date of Birth/Sex: 06-29-1929 (87 y.o. F) Treating RN: Donnamarie Poag Primary Care Provider: Miguel Aschoff Other Clinician: Referring Provider: Miguel Aschoff Treating Provider/Extender: Skipper Cliche in Treatment: 4 Information Obtained from: Patient Chief Complaint Left ankle wounds Electronic Signature(s) Signed: 04/30/2021 8:35:04 AM By: Worthy Keeler PA-C Entered By: Worthy Keeler on 04/30/2021 08:35:03 Buch, Evelia Carlean Jews ([id number]) -------------------------------------------------------------------------------- HPI Details Patient Name: [name], [name] Date of Service: 04/30/2021 8:00 AM Medical Record Number: [id number] Patient Account Number: [id number] Date of Birth/Sex: 26-Jun-1929 (87 y.o. F) Treating RN: Donnamarie Poag Primary Care Provider: Miguel Aschoff Other Clinician: Referring Provider: Miguel Aschoff Treating Provider/Extender: Skipper Cliche in Treatment: 4 History of Present Illness HPI Description: Admission 7/6 Ms. Kayan Garron is a 87 year old female with a past medical history of paroxysmal A. fib not anticoagulated and type 2 diabetes on insulin that presents to the clinic for a 1-48-monthhistory of nonhealing wound to her left ankle. She states she tripped and fell while trying to pick up some food her neighbors left on her porch. She ended up cutting her left ankle. She went to that ED and had sutures placed. Unfortunately they did not stay in place and the wound dehisced. She has been using PolyMem silver to the wound. She currently denies signs of infection. 7/13; patient presents for 1 week follow-up. She has been using Santyl to the surgical wound site and silver alginate to the abrasion.  The abrasion site has healed. She has no complaints today. She followed up with vein and vascular and had ABIs done. She states that the doctor mentioned that she has enough blood flow for wound healing. She denies signs of infection today. 04/14/2021 upon evaluation today patient presents for reevaluation concerning issues that she has been having with her wound in the left lateral ankle region. Fortunately there does not appear to be any signs of infection which is great news. With that being said she is somewhat swollen and she also does have issues noted currently with necrotic tissue on the surface of the wound. I think that we need to address that today to be honest. 04/21/2021 upon evaluation today patient appears to be doing decently well in regard to her foot ulcer. She has been tolerating the dressing changes without complication. There is a minimal amount of slough overall she seems to be making excellent progress. No fevers, chills, nausea, vomiting, or diarrhea. 04/30/2021 upon evaluation today patient actually appears to be doing extremely well in regard to her wound. She has been tolerating the dressing changes without complication. Hydrofera Blue has been used up to this point although honestly I think that the patient would benefit from seeing about starting with a collagen dressing at this point. Fortunately there is no signs of active infection at this time. No fevers, chills, nausea, vomiting, or diarrhea. Electronic Signature(s) Signed: 04/30/2021 9:17:41 AM By: SWorthy KeelerPA-C Entered By: SWorthy Keeleron 04/30/2021 09:17:40 Fate, FNathanial Rancher([id number] -------------------------------------------------------------------------------- Physical Exam Details Patient Name: [name], [name] Date of Service: 04/30/2021 8:00 AM Medical Record Number: [id number] Account Number: [id number] of Birth/Sex: [birth year](87 y.o. F) Treating RN: BDonnamarie PoagPrimary  Care Provider: GMiguel AschoffOther Clinician: Referring Provider: GMiguel AschoffTreating Provider/Extender: SSkipper Clichein Treatment: 4 Constitutional Well-nourished and well-hydrated in no  acute distress. Respiratory normal breathing without difficulty. Psychiatric this patient is able to make decisions and demonstrates good insight into disease process. Alert and Oriented x 3. pleasant and cooperative. Notes Upon inspection patient's wound bed showed signs of good granulation and epithelization at this point. There does not appear to be any signs of infection currently which is great news and overall I am extremely pleased with where things stand. The only reason this is not healing already in my opinion is because it is right at the ankle where when she walks it is somewhat mobile. I do not think that means it will not heal but I do think it will take a little bit longer for that reason I discussed that with the patient and her son today. Electronic Signature(s) Signed: 04/30/2021 9:18:11 AM By: Worthy Keeler PA-C Entered By: Worthy Keeler on 04/30/2021 09:18:11 Lanz, Nathanial Rancher ([id number]) -------------------------------------------------------------------------------- Physician Orders Details Patient Name: Tammy Blade L. Date of Service: 04/30/2021 8:00 AM Medical Record Number: [id number] Patient Account Number: [id number] Date of Birth/Sex: 04-15-1929 (87 y.o. F) Treating RN: Donnamarie Poag Primary Care Provider: Miguel Aschoff Other Clinician: Referring Provider: Miguel Aschoff Treating Provider/Extender: Skipper Cliche in Treatment: 4 Verbal / Phone Orders: No Diagnosis Coding ICD-10 Coding Code Description S91.012S Laceration without foreign body, left ankle, sequela L97.322 Non-pressure chronic ulcer of left ankle with fat layer exposed I87.2 Venous insufficiency (chronic) (peripheral) E11.621 Type 2 diabetes mellitus with foot  ulcer Follow-up Appointments o Return Appointment in 2 weeks. o Nurse Visit as needed - nurse visit week on 05/05/21 to [place]: [facility] for wound care. May utilize formulary equivalent dressing for wound treatment orders unless otherwise specified. Home Health Nurse may visit PRN to address patientos wound care needs. [facility] --please use the 3 layer compression wrap and not unna boot-if questions please call wound center for appropriate layering [phone] if supplies unavailable do not remove dressing will change at next MD visit Bathing/ Shower/ Hygiene o Clean wound with Normal Saline or wound cleanser. o May shower with wound dressing protected with water repellent cover or cast protector. Edema Control - Lymphedema / Segmental Compressive Device / Other o Optional: One layer of unna paste to top of compression wrap (to act as an anchor). - do not use unna wrap on entire leg o Elevate, Exercise Daily and Avoid Standing for Long Periods of Time. o Elevate legs to the level of the heart and pump ankles as often as possible o Elevate leg(s) parallel to the floor when sitting. Wound Treatment Wound #1 - Malleolus Wound Laterality: Left, Lateral Cleanser: Normal Saline 2 x Per Week/30 Days Discharge Instructions: Wash your hands with soap and water. Remove old dressing, discard into plastic bag and place into trash. Cleanse the wound with Normal Saline prior to applying a clean dressing using gauze sponges, not tissues or cotton balls. Do not scrub or use excessive force. Pat dry using gauze sponges, not tissue or cotton balls. Primary Dressing: Prisma 4.34 (in) 2 x Per Week/30 Days Discharge Instructions: COLLAGEN-Moisten w/normal saline or sterile water; Cover wound as directed. Do not remove from wound bed. Secondary Dressing: ABD Pad 5x9 (in/in) 2 x Per Week/30 Days Discharge Instructions: Cover  dressing Compression Wrap: Profore Lite LF 3 Multilayer Compression Bandaging System 2 x Per Week/30 Days Discharge Instructions: Apply 3 multi-layer wrap as prescribed. Electronic Signature(s) Signed: 04/30/2021 8:52:54 AM By: Donnamarie Poag Signed: 04/30/2021  9:44:25 PM By: Worthy Keeler PA-C Entered By: Donnamarie Poag on 04/30/2021 08:50:48 Sneath, Nathanial Rancher ([id number]) -------------------------------------------------------------------------------- Problem List Details Patient Name: Conkle, Haisley L. Date of Service: 04/30/2021 8:00 AM Medical Record Number: [id number] Patient Account Number: [id number] Date of Birth/Sex: [birth year] (87 y.o. F) Treating RN: Donnamarie Poag Primary Care Provider: Miguel Aschoff Other Clinician: Referring Provider: Miguel Aschoff Treating Provider/Extender: Skipper Cliche in Treatment: 4 Active Problems ICD-10 Encounter Code Description Active Date MDM Diagnosis S91.012S Laceration without foreign body, left ankle, sequela 04/14/2021 No Yes L97.322 Non-pressure chronic ulcer of left ankle with fat layer exposed 04/01/2021 No Yes I87.2 Venous insufficiency (chronic) (peripheral) 04/14/2021 No Yes E11.621 Type 2 diabetes mellitus with foot ulcer 04/01/2021 No Yes Inactive Problems Resolved Problems Electronic Signature(s) Signed: 04/30/2021 8:34:57 AM By: Worthy Keeler PA-C Entered By: Worthy Keeler on 04/30/2021 08:34:56 Orris, Kerina L. ([id number]) -------------------------------------------------------------------------------- Progress Note Details Patient Name: Gift, Jalacia L. Date of Service: 04/30/2021 8:00 AM Medical Record Number: [id number] Patient Account Number: [id number] Date of Birth/Sex: 10-14-1928 (87 y.o. F) Treating RN: Donnamarie Poag Primary Care Provider: Miguel Aschoff Other Clinician: Referring Provider: Miguel Aschoff Treating Provider/Extender: Skipper Cliche in Treatment: 4 Subjective Chief  Complaint Information obtained from Patient Left ankle wounds History of Present Illness (HPI) Admission 7/6 Ms. Elesha Wydra is a 87 year old female with a past medical history of paroxysmal A. fib not anticoagulated and type 2 diabetes on insulin that presents to the clinic for a 1-33-monthhistory of nonhealing wound to her left ankle. She states she tripped and fell while trying to pick up some food her neighbors left on her porch. She ended up cutting her left ankle. She went to that ED and had sutures placed. Unfortunately they did not stay in place and the wound dehisced. She has been using PolyMem silver to the wound. She currently denies signs of infection. 7/13; patient presents for 1 week follow-up. She has been using Santyl to the surgical wound site and silver alginate to the abrasion. The abrasion site has healed. She has no complaints today. She followed up with vein and vascular and had ABIs done. She states that the doctor mentioned that she has enough blood flow for wound healing. She denies signs of infection today. 04/14/2021 upon evaluation today patient presents for reevaluation concerning issues that she has been having with her wound in the left lateral ankle region. Fortunately there does not appear to be any signs of infection which is great news. With that being said she is somewhat swollen and she also does have issues noted currently with necrotic tissue on the surface of the wound. I think that we need to address that today to be honest. 04/21/2021 upon evaluation today patient appears to be doing decently well in regard to her foot ulcer. She has been tolerating the dressing changes without complication. There is a minimal amount of slough overall she seems to be making excellent progress. No fevers, chills, nausea, vomiting, or diarrhea. 04/30/2021 upon evaluation today patient actually appears to be doing extremely well in regard to her wound. She has been  tolerating the dressing changes without complication. Hydrofera Blue has been used up to this point although honestly I think that the patient would benefit from seeing about starting with a collagen dressing at this point. Fortunately there is no signs of active infection at this time. No fevers, chills, nausea, vomiting, or diarrhea. Objective Constitutional Well-nourished and well-hydrated in no acute distress. Vitals Time Taken: 8:15  AM, Height: 62 in, Temperature: 98.2 F, Pulse: 94 bpm, Respiratory Rate: 18 breaths/min, Blood Pressure: 171/84 mmHg. Respiratory normal breathing without difficulty. Psychiatric this patient is able to make decisions and demonstrates good insight into disease process. Alert and Oriented x 3. pleasant and cooperative. General Notes: Upon inspection patient's wound bed showed signs of good granulation and epithelization at this point. There does not appear to be any signs of infection currently which is great news and overall I am extremely pleased with where things stand. The only reason this is not healing already in my opinion is because it is right at the ankle where when she walks it is somewhat mobile. I do not think that means it will not heal but I do think it will take a little bit longer for that reason I discussed that with the patient and her son today. Integumentary (Hair, Skin) Wound #1 status is Open. Original cause of wound was Surgical Injury. The date acquired was: 02/08/2021. The wound has been in treatment 4 weeks. The wound is located on the Left,Lateral Malleolus. The wound measures 0.3cm length x 1.5cm width x 0.5cm depth; 0.353cm^2 area and 0.177cm^3 volume. There is Fat Layer (Subcutaneous Tissue) exposed. There is no tunneling or undermining noted. There is a medium amount of serosanguineous drainage noted. There is medium (34-66%) red, pink granulation within the wound bed. There is a medium (34-66%) amount Urias, Keyana L.  ([id number]) of necrotic tissue within the wound bed including Adherent Slough. Assessment Active Problems ICD-10 Laceration without foreign body, left ankle, sequela Non-pressure chronic ulcer of left ankle with fat layer exposed Venous insufficiency (chronic) (peripheral) Type 2 diabetes mellitus with foot ulcer Procedures Wound #1 Pre-procedure diagnosis of Wound #1 is a Dehisced Wound located on the Left,Lateral Malleolus . There was a Three Layer Compression Therapy Procedure by Donnamarie Poag, RN. Post procedure Diagnosis Wound #1: Same as Pre-Procedure Plan Follow-up Appointments: Return Appointment in 2 weeks. Nurse Visit as needed - nurse visit week on 05/05/21 to [place] for [facility] care. May utilize formulary equivalent dressing for wound treatment orders unless otherwise specified. Home Health Nurse may visit PRN to address patient s wound care needs. [facility] --please use the 3 layer compression wrap and not unna boot-if questions please call wound center for appropriate layering [phone] if supplies unavailable do not remove dressing will change at next MD visit Bathing/ Shower/ Hygiene: Clean wound with Normal Saline or wound cleanser. May shower with wound dressing protected with water repellent cover or cast protector. Edema Control - Lymphedema / Segmental Compressive Device / Other: Optional: One layer of unna paste to top of compression wrap (to act as an anchor). - do not use unna wrap on entire leg Elevate, Exercise Daily and Avoid Standing for Long Periods of Time. Elevate legs to the level of the heart and pump ankles as often as possible Elevate leg(s) parallel to the floor when sitting. WOUND #1: - Malleolus Wound Laterality: Left, Lateral Cleanser: Normal Saline 2 x Per Week/30 Days Discharge Instructions: Wash your hands with soap and water. Remove old dressing, discard into plastic bag and  place into trash. Cleanse the wound with Normal Saline prior to applying a clean dressing using gauze sponges, not tissues or cotton balls. Do not scrub or use excessive force. Pat dry using gauze sponges, not tissue or cotton balls. Primary Dressing: Prisma 4.34 (in) 2 x Per Week/30 Days Discharge Instructions: [id number]  w/normal saline or sterile water; Cover wound as directed. Do not remove from wound bed. Secondary Dressing: ABD Pad 5x9 (in/in) 2 x Per Week/30 Days Discharge Instructions: Cover dressing Compression Wrap: Profore Lite LF 3 Multilayer Compression Bandaging System 2 x Per Week/30 Days Discharge Instructions: Apply 3 multi-layer wrap as prescribed. 1. Would recommend currently that we going continue with the wound care measures as before and the patient is in agreement the plan. This includes the use of the 3 layer compression wrap which I think is doing a great job. 2. I am also can recommend at this time that the patient switch to a collagen based dressing for the wound I think that still can be the optimal thing here. We will see patient back for reevaluation in 2 weeks here in the clinic. If anything worsens or changes patient will contact our office for additional recommendations. SAIDA, HUCKABAY ([id number]) Electronic Signature(s) Signed: 04/30/2021 9:18:58 AM By: Worthy Keeler PA-C Entered By: Worthy Keeler on 04/30/2021 09:18:58 Nettleton, Nathanial Rancher ([id number]) -------------------------------------------------------------------------------- SuperBill Details Patient Name: Tammy Blade L. Date of Service: 04/30/2021 Medical Record Number: [id number] Patient Account Number: [id number] Date of Birth/Sex: 12/07/1928 (87 y.o. F) Treating RN: Donnamarie Poag Primary Care Provider: Miguel Aschoff Other Clinician: Referring Provider: Miguel Aschoff Treating Provider/Extender: Skipper Cliche in Treatment: 4 Diagnosis Coding ICD-10 Codes Code  Description [phone] Laceration without foreign body, left ankle, sequela L97.322 Non-pressure chronic ulcer of left ankle with fat layer exposed I87.2 Venous insufficiency (chronic) (peripheral) E11.621 Type 2 diabetes mellitus with foot ulcer Facility Procedures CPT4 Code: [id number] Description: (Facility Use Only) [facility] LT LEG Modifier: Quantity: 1 Physician Procedures CPT4 Code: [id number] Description: [id number] - WC PHYS LEVEL 3 - EST PT Modifier: Quantity: 1 CPT4 Code: Description: ICD-10 Diagnosis Description [id number] Laceration without foreign body, left ankle, sequela L97.322 Non-pressure chronic ulcer of left ankle with fat layer exposed I87.2 Venous insufficiency (chronic) (peripheral) E11.621 Type 2 diabetes  mellitus with foot ulcer Modifier: Quantity: Electronic Signature(s) Signed: 04/30/2021 9:19:16 AM By: Worthy Keeler PA-C Previous Signature: 04/30/2021 8:52:54 AM Version By: Donnamarie Poag Entered By: Worthy Keeler on 04/30/2021 09:19:16

## 2021-04-30 NOTE — Telephone Encounter (Signed)
Copied from [place] [id number]. Topic: General - Other >> Apr 30, 2021 10:22 AM Tessa Lerner A wrote: Reason for CRM: Deanna with [facility] has called to check on the status of orders submitted to the practice by fax on 04/16/21  Please contact further when possible

## 2021-05-01 ENCOUNTER — Other Ambulatory Visit: Payer: Self-pay | Admitting: Family Medicine

## 2021-05-01 NOTE — Telephone Encounter (Signed)
Requested medication (s) are due for refill today: Yes  Requested medication (s) are on the active medication list: Yes  Last refill:  04/29/20  Future visit scheduled: Yes  Notes to clinic:  Prescription expired.    Requested Prescriptions  Pending Prescriptions Disp Refills   ezetimibe (ZETIA) 10 MG tablet [Pharmacy Med Name: EZETIMIBE 10 MG TAB] 30 tablet 12    Sig: TAKE 1 TABLET BY MOUTH DAILY      Cardiovascular:  Antilipid - Sterol Transport Inhibitors Failed - 05/01/2021  8:54 AM      Failed - Total Cholesterol in normal range and within 360 days    Cholesterol, Total  Date Value Ref Range Status  05/26/2016 237 (H) 100 - 199 mg/dL Final   Cholesterol  Date Value Ref Range Status  02/12/2020 128 0 - 200 Final          Failed - LDL in normal range and within 360 days    LDL Cholesterol (Calc)  Date Value Ref Range Status  09/13/2017 104 (H) mg/dL (calc) Final    Comment:    Reference range: <100 . Desirable range <100 mg/dL for primary prevention;   <70 mg/dL for patients with CHD or diabetic patients  with > or = 2 CHD risk factors. Marland Kitchen LDL-C is now calculated using the Martin-Hopkins  calculation, which is a validated novel method providing  better accuracy than the Friedewald equation in the  estimation of LDL-C.  Cresenciano Genre et al. Annamaria Helling. [id number]): 2061-2068  (http://education.[employer].com/faq/FAQ164)    LDL Cholesterol  Date Value Ref Range Status  02/12/2020 49  Final          Failed - HDL in normal range and within 360 days    HDL  Date Value Ref Range Status  02/12/2020 62 35 - 70 Final  05/26/2016 54 >39 mg/dL Final          Failed - Triglycerides in normal range and within 360 days    Triglycerides  Date Value Ref Range Status  02/12/2020 85 40 - 160 Final          Passed - Valid encounter within last 12 months    Recent Outpatient Visits           2 months ago Laceration without foreign body, left ankle, subsequent  encounter   [place], Vickki Muff, PA-C   3 months ago Benign essential HTN   [facility] Jerrol Banana., MD   5 months ago Anemia, unspecified type   [facility] Jerrol Banana., MD   6 months ago Left shoulder pain, unspecified chronicity   [facility], Vickki Muff, PA-C   9 months ago Pain in joint of left hand   [facility], Vickki Muff, PA-C       Future Appointments             In 6 days Jerrol Banana., MD [facility]              Signed Prescriptions Disp Refills   traZODone (DESYREL) 50 MG tablet 45 tablet 3    Sig: TAKE 1 AND 1/2 TABLET AT BEDTIME AS NEEDED FOR SLEEP      Psychiatry: Antidepressants - Serotonin Modulator Passed - 05/01/2021  8:54 AM      Passed - Completed PHQ-2 or PHQ-9 in the last 360 days      Passed - Valid encounter within last  6 months    Recent Outpatient Visits           2 months ago Laceration without foreign body, left ankle, subsequent encounter   [place], PA-C   3 months ago Benign essential HTN   [facility] Jerrol Banana., MD   5 months ago Anemia, unspecified type   [facility] Jerrol Banana., MD   6 months ago Left shoulder pain, unspecified chronicity   [place], PA-C   9 months ago Pain in joint of left hand   [facility], Driscilla Grammes       Future Appointments             In 6 days Jerrol Banana., MD [facility], [place]

## 2021-05-04 DIAGNOSIS — I480 Paroxysmal atrial fibrillation: Secondary | ICD-10-CM | POA: Diagnosis not present

## 2021-05-04 DIAGNOSIS — E7800 Pure hypercholesterolemia, unspecified: Secondary | ICD-10-CM | POA: Diagnosis not present

## 2021-05-04 DIAGNOSIS — J449 Chronic obstructive pulmonary disease, unspecified: Secondary | ICD-10-CM | POA: Diagnosis not present

## 2021-05-04 DIAGNOSIS — I129 Hypertensive chronic kidney disease with stage 1 through stage 4 chronic kidney disease, or unspecified chronic kidney disease: Secondary | ICD-10-CM | POA: Diagnosis not present

## 2021-05-04 DIAGNOSIS — Z7982 Long term (current) use of aspirin: Secondary | ICD-10-CM | POA: Diagnosis not present

## 2021-05-04 DIAGNOSIS — D631 Anemia in chronic kidney disease: Secondary | ICD-10-CM | POA: Diagnosis not present

## 2021-05-04 DIAGNOSIS — Z794 Long term (current) use of insulin: Secondary | ICD-10-CM | POA: Diagnosis not present

## 2021-05-04 DIAGNOSIS — Z7951 Long term (current) use of inhaled steroids: Secondary | ICD-10-CM | POA: Diagnosis not present

## 2021-05-04 DIAGNOSIS — G4700 Insomnia, unspecified: Secondary | ICD-10-CM | POA: Diagnosis not present

## 2021-05-04 DIAGNOSIS — F32A Depression, unspecified: Secondary | ICD-10-CM | POA: Diagnosis not present

## 2021-05-04 DIAGNOSIS — E1151 Type 2 diabetes mellitus with diabetic peripheral angiopathy without gangrene: Secondary | ICD-10-CM | POA: Diagnosis not present

## 2021-05-04 DIAGNOSIS — Z85828 Personal history of other malignant neoplasm of skin: Secondary | ICD-10-CM | POA: Diagnosis not present

## 2021-05-04 DIAGNOSIS — I2510 Atherosclerotic heart disease of native coronary artery without angina pectoris: Secondary | ICD-10-CM | POA: Diagnosis not present

## 2021-05-04 DIAGNOSIS — K219 Gastro-esophageal reflux disease without esophagitis: Secondary | ICD-10-CM | POA: Diagnosis not present

## 2021-05-04 DIAGNOSIS — E1142 Type 2 diabetes mellitus with diabetic polyneuropathy: Secondary | ICD-10-CM | POA: Diagnosis not present

## 2021-05-04 DIAGNOSIS — Z7902 Long term (current) use of antithrombotics/antiplatelets: Secondary | ICD-10-CM | POA: Diagnosis not present

## 2021-05-04 DIAGNOSIS — N1831 Chronic kidney disease, stage 3a: Secondary | ICD-10-CM | POA: Diagnosis not present

## 2021-05-04 DIAGNOSIS — M5136 Other intervertebral disc degeneration, lumbar region: Secondary | ICD-10-CM | POA: Diagnosis not present

## 2021-05-04 DIAGNOSIS — R1310 Dysphagia, unspecified: Secondary | ICD-10-CM | POA: Diagnosis not present

## 2021-05-04 DIAGNOSIS — E1122 Type 2 diabetes mellitus with diabetic chronic kidney disease: Secondary | ICD-10-CM | POA: Diagnosis not present

## 2021-05-04 DIAGNOSIS — S91012D Laceration without foreign body, left ankle, subsequent encounter: Secondary | ICD-10-CM | POA: Diagnosis not present

## 2021-05-04 NOTE — Telephone Encounter (Signed)
Orders were received and waiting for provider to sign.

## 2021-05-05 ENCOUNTER — Other Ambulatory Visit: Payer: Self-pay

## 2021-05-05 DIAGNOSIS — Z794 Long term (current) use of insulin: Secondary | ICD-10-CM | POA: Diagnosis not present

## 2021-05-05 DIAGNOSIS — L97822 Non-pressure chronic ulcer of other part of left lower leg with fat layer exposed: Secondary | ICD-10-CM | POA: Diagnosis not present

## 2021-05-05 DIAGNOSIS — S91012D Laceration without foreign body, left ankle, subsequent encounter: Secondary | ICD-10-CM | POA: Diagnosis not present

## 2021-05-05 DIAGNOSIS — I480 Paroxysmal atrial fibrillation: Secondary | ICD-10-CM | POA: Diagnosis not present

## 2021-05-05 DIAGNOSIS — I872 Venous insufficiency (chronic) (peripheral): Secondary | ICD-10-CM | POA: Diagnosis not present

## 2021-05-05 DIAGNOSIS — L97322 Non-pressure chronic ulcer of left ankle with fat layer exposed: Secondary | ICD-10-CM | POA: Diagnosis not present

## 2021-05-05 DIAGNOSIS — E11622 Type 2 diabetes mellitus with other skin ulcer: Secondary | ICD-10-CM | POA: Diagnosis not present

## 2021-05-05 NOTE — Patient Instructions (Signed)
Thank you for allowing the Chronic Care Management team to participate in your care.    Goal Addressed: Patient Care Plan: Fall Risk (Adult)     Problem Identified: Fall Risk      Long-Range Goal: Maintain Function /Absence of Fall and Fall-Related Injury   Start Date: 04/06/2021  Expected End Date: 07/05/2021  Priority: High  Note:   Current Barriers:  Fall Risk r/t Impaired Vision and Impaired Gait  Clinical Goal(s):  Over the next 120 days, patient will not experience falls or require emergent care d/t fall related injuries.  Interventions:  Collaboration with Jerrol Banana., MD regarding development and update of comprehensive plan of care as evidenced by provider attestation and co-signature. Inter-disciplinary care team collaboration (see longitudinal plan of care) Reviewed safety and fall prevention measures.  Tammy reports no falls since her last hospitalization. She continues using her walker. Tammy reports the stitches to her left foot have been removed but the wound has not healed. She is being followed by the Wound Clinic. Also notes that the pulse to the top of her left foot was very difficult to detect during a recent provider appointment. She has a history of stent placement. She will follow up with the Vascular team. Update: Tammy reports Mrs. Adderley's ability to ambulate has complained due to pain in her left lower abdomen that radiates to her left lower extremity. Tammy reports she has been evaluated in Urgent Care. Reports Mrs. Flora has not experienced another fall but complains of constant pain to her left side. Notes that she has been unable to lay in bed d/t pain and currently sleeps in her recliner. She is requesting to forward a PCP message regarding toradol for acute pain. Message forwarded to Dr. Rosanna Randy to determine if Mrs. Heacox can be scheduled to report to the clinic for a  toradol injection.    Self-Care Deficits/Patient Goals:  -Utilize  assistive device appropriately with all ambulation -Change positions slowly and wear non skid footwear when ambulating -Continue weekly outreach with the Wound Clinic as scheduled -Contact provider or the care management team with questions or new concerns   Follow Up Plan Will follow up next month    Patient Care Plan: Wound to Left Foot     Problem Identified: Infection      Goal: Infection Prevented or Managed   Start Date: 04/06/2021  Expected End Date: 05/06/2021  Priority: High  Note:   Current Barriers:  Care Management support and educational needs related to left foot wound in patient with Diabetes.  Clinical Goal(s):  Over the next 30 days, patient will not require hospitalizations for complications r/t wound infection.  Interventions:  Collaboration with Jerrol Banana., MD regarding development and update of comprehensive plan of care as evidenced by provider attestation and co-signature Inter-disciplinary care team collaboration (see longitudinal plan of care) Discussed current treatment plan for wound care. Tammy reports that Mrs. Cahall currently requires weekly visits to the Wound Clinic for wound cleaning. She requires daily dressing changes. Dressings are currently being changed once a week by the Wound Care staff and three times a week by the [facility] team. Tammy confirmed that the family completes dressing changes on the remaining days. Discussed risk for delayed wound healing d/t diabetes. Thoroughly reviewed s/sx of wound infection and indications for immediately notifying the Wound Clinic. Advised to seek medical attention if worsening changes are noted after business hours. Update: Tammy reports the left foot wound appears  to be healing. Notes that Mrs. Ibach has continued to complain of pain on her left side that radiates from her lower abdomen down her left left. Reports she was evaluted in Urgent Care. Will continue to follow up with  the Wound Clinic to ensure Mrs. Dura's wound is healing properly.    Self Care Activities/Patient Goals: Self administer medications as prescribed Take antibiotics when prescribed and complete course as instructed Attend weekly visits with the Wound Care clinic as scheduled Continue to monitor wound and reports s/sx of infection Calls provider office for questions or new concerns   Follow Up Plan:  Will follow up next month      Mrs. Florio's daughter/caregiver Tammy verbalized understanding of the information discussed during the telephonic outreach. Declined need for mailed instructions. A member of the care management team will follow up within the next month.   Cristy Friedlander [facility] Care Management [facility] [phone]

## 2021-05-06 NOTE — Telephone Encounter (Signed)
Caller name: Deanna  Relation to pt: Order Recovery Specialist from [facility]  Call back number: [phone] and fax # [phone]   Reason for call:  Checking on the status of plan of care orders.

## 2021-05-06 NOTE — Patient Instructions (Addendum)
Thank you for allowing the Chronic Care Management team to participate in your care.    Goal Addressed: Care Plan : Fall Risk (Adult)   Problem: Fall Risk      Long-Range Goal: Maintain Function /Absence of Fall and Fall-Related Injury   Start Date: 04/06/2021  Expected End Date: 07/05/2021  Priority: High  Current Barriers:  Fall Risk r/t Impaired Vision and Impaired Gait in patient with Diabetes  Clinical Goal(s):  Over the next 120 days, patient will not experience falls or require emergent care d/t fall related injuries.  Interventions:  Collaboration with Jerrol Banana., MD regarding development and update of comprehensive plan of care as evidenced by provider attestation and co-signature. Inter-disciplinary care team collaboration (see longitudinal plan of care) Reviewed safety and fall prevention measures.  [name] reports no falls since her last hospitalization. She continues using her walker. [name] reports the stitches to her left foot have been removed but the wound has not healed. She is being followed by the Wound Clinic. Also notes that the pulse to the top of her left foot was very difficult to detect during a recent provider appointment. She has a history of stent placement. She will follow up with the Vascular team. Update: Informed [name] of Dr. Alben Spittle recommendation. Prefers not to order toradol. Discussed recommendation to treat with compresses and rest d/t [name] Hall describing the pain as a possible pulled muscle. Per chart review, clinic staff will contact [name] Hall to review recommendations and provide indications for follow-up.    Self-Care Deficits/Patient Goals:  -Utilize assistive device appropriately with all ambulation -Change positions slowly and wear non skid footwear when ambulating -Continue weekly outreach with the Wound Clinic as scheduled -Contact provider or the care management team with questions or new concerns   Follow Up  Plan Will follow up next month      [name] Hall or her caregiver/daughter [name] Hall will contact the staff for urgent questions and concerns if needed. A member of the care management team will follow up next month.    [name] [facility] Care Management [facility] [phone]

## 2021-05-06 NOTE — Chronic Care Management (AMB) (Signed)
Chronic Care Management   CCM RN Visit Note   Name: [name] MRN: [id number] DOB: 23-Feb-1929  Subjective: [name] is a 87 y.o. year old female who is a primary care patient of [name]., MD. The care management team was consulted for assistance with disease management and care coordination needs.    Engaged with patient's daughter/caregiver [name] for follow up visit in response to provider referral for case management and/or care coordination services.   Consent to Services:  The patient was given information about Chronic Care Management services, agreed to services, and gave verbal consent prior to initiation of services.  Please see initial visit note for detailed documentation.    Assessment: Review of patient past medical history, allergies, medications, health status, including review of consultants reports, laboratory and other test data, was performed as part of comprehensive evaluation and provision of chronic care management services.   SDOH (Social Determinants of Health) assessments and interventions performed:  No  CCM Care Plan  Allergies  Allergen Reactions   Bacitracin-Neomycin-Polymyxin Rash   Neomycin-Bacitracin Zn-Polymyx Swelling and Rash   Latex Rash   Lidocaine Rash   Aricept [Donepezil Hcl] Diarrhea   Benzalkonium Chloride Itching and Swelling   Ibuprofen     Other reaction(s): Dizziness Heart fluttering. Tachycardia. Tachycardia.   Lidocaine Hcl Itching    Per pt, "all caine meds cause severe itching"   Valdecoxib Nausea And Vomiting   Albuterol Rash   Tape Rash    blisters   Triamcinolone Rash    Outpatient Encounter Medications as of 04/14/2021  Medication Sig   ADVAIR HFA 230-21 MCG/ACT inhaler TAKE 2 PUFFS INTO LUNGS TWICE A DAY (Patient taking differently: Inhale 2 puffs into the lungs 2 (two) times daily.)   albuterol (VENTOLIN HFA) 108 (90 Base) MCG/ACT inhaler INHALE 2 PUFFS INTO THE LUNGS EVERY 4  HOURS AS NEEDED FOR WHEEZING ORSHORTNESS OF BREATH (Patient taking differently: Inhale 2 puffs into the lungs every 4 (four) hours as needed for wheezing or shortness of breath. INHALE 2 PUFFS INTO THE LUNGS EVERY 4 HOURS AS NEEDED FOR WHEEZING ORSHORTNESS OF BREATH)   aspirin 325 MG tablet Take 325 mg by mouth daily. Reported on 01/05/2016   atorvastatin (LIPITOR) 10 MG tablet Take 1 tablet (10 mg total) by mouth daily.   cetirizine (ZYRTEC) 10 MG tablet TAKE ONE TABLET BY MOUTH EVERY DAY (Patient taking differently: Take 10 mg by mouth daily.)   cholecalciferol (VITAMIN D3) 25 MCG (1000 UT) tablet Take 1,000 Units by mouth daily.   clopidogrel (PLAVIX) 75 MG tablet TAKE 1 TABLET BY MOUTH DAILY   diltiazem (TIADYLT ER) 360 MG 24 hr capsule Hold until followup with your outpatient doctor due to your blood pressure on the low side.   FLUoxetine (PROZAC) 20 MG capsule Take 1 capsule (20 mg total) by mouth daily.   glucose blood test strip ACCU-CHEK AVIVA PLUS TEST STRP Use to check sugars 3 times daily.   insulin glargine (LANTUS) 100 UNIT/ML injection Inject 0.05 mLs (5 Units total) into the skin at bedtime.   insulin lispro (HUMALOG) 100 UNIT/ML KiwkPen Inject 10 Units into the skin 3 (three) times daily. 10 units before breakfast, 8 before lunch and 18 before supper   iron polysaccharides (NIFEREX) 150 MG capsule Take 1 capsule (150 mg total) by mouth daily.   isosorbide mononitrate (IMDUR) 30 MG 24 hr tablet Take 30 mg by mouth daily.   lansoprazole (PREVACID) 30 MG capsule TAKE 1  CAPSULE BY MOUTH ONCE DAILY AT NOON (Patient taking differently: Take 30 mg by mouth daily at 12 noon. TAKE 1 CAPSULE BY MOUTH ONCE DAILY AT NOON)   losartan (COZAAR) 100 MG tablet Take 0.5 tablets (50 mg total) by mouth daily. This is a decrease from 100 mg daily.   Magnesium 400 MG CAPS Take by mouth daily.   magnesium oxide (MAG-OX) 400 MG tablet Take 400 mg by mouth daily.   meloxicam (MOBIC) 7.5 MG tablet Take 1  tablet (7.5 mg total) by mouth daily as needed for pain.   memantine (NAMENDA) 5 MG tablet TAKE ONE TABLET TWICE DAILY (Patient taking differently: Take 5 mg by mouth 2 (two) times daily.)   montelukast (SINGULAIR) 10 MG tablet TAKE ONE TABLET AT BEDTIME (Patient taking differently: Take 10 mg by mouth at bedtime.)   ondansetron (ZOFRAN-ODT) 4 MG disintegrating tablet Take 4 mg by mouth every 8 (eight) hours as needed for nausea. For up to 12 doses.   pantoprazole (PROTONIX) 40 MG tablet Take 40 mg by mouth daily. (Patient not taking: No sig reported)   QUEtiapine (SEROQUEL) 25 MG tablet Take 1 tablet by mouth at bedtime.   TUSSIN DM 100-10 MG/5ML liquid TAKE 1 TEASPOONFUL BY MOUTH EVERY 4 HOURS AS NEEDED FOR COUGH   vitamin B-12 1000 MCG tablet Take 1 tablet (1,000 mcg total) by mouth daily.   vitamin E 1000 UNIT capsule Take 1,000 Units by mouth daily.   [DISCONTINUED] ezetimibe (ZETIA) 10 MG tablet TAKE 1 TABLET BY MOUTH DAILY (Patient taking differently: Take 10 mg by mouth daily.)   [DISCONTINUED] traZODone (DESYREL) 50 MG tablet TAKE 1 AND 1/2 TABLET AT BEDTIME AS NEEDED FOR SLEEP (Patient taking differently: Take 75 mg by mouth at bedtime as needed for sleep.)   No facility-administered encounter medications on file as of 04/14/2021.    Patient Active Problem List   Diagnosis Date Noted   Atherosclerosis of native arteries of the extremities with ulceration ([place]) 04/07/2021   PSVT (paroxysmal supraventricular tachycardia) ([place]) [id number]   Acute metabolic encephalopathy 123XX123   Frequent falls 02/10/2021   COVID-19 virus infection 02/10/2021   Chronic kidney disease, stage 3a ([place]) 02/10/2021   Dementia ([place]) 02/10/2021   Garbled speech, intermittent 02/10/2021   Laceration without foreign body, left ankle, subsequent encounter 02/10/2021   AMS (altered mental status) 02/10/2021   Coronary artery disease involving native coronary artery of native heart 04/17/2020   Celiac  artery stenosis ([place]) 06/12/2019   Bilateral lower abdominal cramping 04/20/2019   UTI symptoms 04/20/2019   History of rectal bleeding 03/26/2019   DM type 2 with diabetic peripheral neuropathy ([place]) 12/14/2018   Arthropathy of lumbar facet joint 09/04/2018   Degeneration of lumbar intervertebral disc 09/04/2018   Spondylolisthesis, grade 1 09/04/2018   Lewy body dementia ([place]) 02/17/2018   PVD (peripheral vascular disease) ([place]) 08/16/2017   Pain in limb 07/27/2016   Hallucinations 07/07/2016   Chest pain 07/01/2016   Depression 06/23/2016   Hypokalemia 09/03/2015   Insomnia 07/23/2015   Headache 07/23/2015   Type 2 diabetes mellitus with vascular disease ([place]) 06/24/2015   Chronic vulvitis 06/02/2015   Allergic rhinitis 05/30/2015   Anemia 05/30/2015   Back ache 05/30/2015   Body mass index (BMI) of 29.0-29.9 in adult 05/30/2015   Edema 05/30/2015   Dilatation of esophagus 05/30/2015   Fall 05/30/2015   H/O deep venous thrombosis 05/30/2015   Hypercholesteremia 05/30/2015   Heart & renal disease, hypertensive, with heart failure (  [place]) 05/30/2015   Hyponatremia 05/30/2015   Calculus of kidney 05/30/2015   Gonalgia 05/30/2015   Psoriasis 05/30/2015   Avitaminosis D 05/30/2015   Cough 04/01/2015   Combined fat and carbohydrate induced hyperlipemia 12/30/2014   Paroxysmal atrial fibrillation ([place]) 07/10/2014   Abnormal gait 07/08/2014   Anxiety state 07/08/2014   Chronic kidney disease 07/08/2014   Acid reflux 07/08/2014   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[place]) 07/08/2014   Chronic obstructive pulmonary disease ([place]) 07/08/2014   Type II diabetes mellitus with renal manifestations ([place]) 07/08/2014   Benign essential HTN 06/26/2014   Carotid artery narrowing 06/26/2014   Myalgia 06/26/2014   Basal cell carcinoma of face 05/01/2014   Disease of female genital organs 11/28/2012   Incomplete bladder emptying 09/05/2012   Excessive urination at night 08/15/2012   Basal cell  carcinoma of ear 10/26/2011    Conditions to be addressed/monitored: Fall Risk  Care Plan : Fall Risk (Adult)   Problem: Fall Risk      Long-Range Goal: Maintain Function /Absence of Fall and Fall-Related Injury   Start Date: 04/06/2021  Expected End Date: 07/05/2021  Priority: High  Current Barriers:  Fall Risk r/t Impaired Vision and Impaired Gait in patient with Diabetes  Clinical Goal(s):  Over the next 120 days, patient will not experience falls or require emergent care d/t fall related injuries.  Interventions:  Collaboration with [name]., MD regarding development and update of comprehensive plan of care as evidenced by provider attestation and co-signature. Inter-disciplinary care team collaboration (see longitudinal plan of care) Reviewed safety and fall prevention measures.  Tammy reports no falls since her last hospitalization. She continues using her walker. Tammy reports the stitches to her left foot have been removed but the wound has not healed. She is being followed by the Wound Clinic. Also notes that the pulse to the top of her left foot was very difficult to detect during a recent provider appointment. She has a history of stent placement. She will follow up with the Vascular team. Update: Informed Tammy of Dr. Alben Spittle recommendation. Prefers not to order toradol. Discussed recommendation to treat with compresses and rest d/t Mrs. Varas describing the pain as a possible pulled muscle. Per chart review, clinic staff will contact Mrs. Kalb to review recommendations and provide indications for follow-up.    Self-Care Deficits/Patient Goals:  -Utilize assistive device appropriately with all ambulation -Change positions slowly and wear non skid footwear when ambulating -Continue weekly outreach with the Wound Clinic as scheduled -Contact provider or the care management team with questions or new concerns   Follow Up Plan Will follow up next  month     PLAN A member of the care management team will follow up next month.    Cristy Friedlander [facility] Care Management [facility] [phone]

## 2021-05-07 ENCOUNTER — Encounter: Payer: Self-pay | Admitting: Family Medicine

## 2021-05-07 ENCOUNTER — Ambulatory Visit (INDEPENDENT_AMBULATORY_CARE_PROVIDER_SITE_OTHER): Payer: Medicare Other | Admitting: Family Medicine

## 2021-05-07 ENCOUNTER — Other Ambulatory Visit: Payer: Self-pay

## 2021-05-07 VITALS — BP 174/78 | HR 92 | Temp 98.4°F | Resp 18 | Wt 142.4 lb

## 2021-05-07 DIAGNOSIS — G301 Alzheimer's disease with late onset: Secondary | ICD-10-CM | POA: Diagnosis not present

## 2021-05-07 DIAGNOSIS — L309 Dermatitis, unspecified: Secondary | ICD-10-CM | POA: Diagnosis not present

## 2021-05-07 DIAGNOSIS — E7800 Pure hypercholesterolemia, unspecified: Secondary | ICD-10-CM | POA: Diagnosis not present

## 2021-05-07 DIAGNOSIS — E1121 Type 2 diabetes mellitus with diabetic nephropathy: Secondary | ICD-10-CM

## 2021-05-07 DIAGNOSIS — J449 Chronic obstructive pulmonary disease, unspecified: Secondary | ICD-10-CM | POA: Diagnosis not present

## 2021-05-07 DIAGNOSIS — F0280 Dementia in other diseases classified elsewhere without behavioral disturbance: Secondary | ICD-10-CM

## 2021-05-07 DIAGNOSIS — I6523 Occlusion and stenosis of bilateral carotid arteries: Secondary | ICD-10-CM | POA: Diagnosis not present

## 2021-05-07 DIAGNOSIS — Z794 Long term (current) use of insulin: Secondary | ICD-10-CM | POA: Diagnosis not present

## 2021-05-07 DIAGNOSIS — R443 Hallucinations, unspecified: Secondary | ICD-10-CM

## 2021-05-07 DIAGNOSIS — E1159 Type 2 diabetes mellitus with other circulatory complications: Secondary | ICD-10-CM

## 2021-05-07 DIAGNOSIS — K219 Gastro-esophageal reflux disease without esophagitis: Secondary | ICD-10-CM

## 2021-05-07 DIAGNOSIS — I10 Essential (primary) hypertension: Secondary | ICD-10-CM | POA: Diagnosis not present

## 2021-05-07 NOTE — Patient Instructions (Signed)
Try over-the-counter Topical Caladryl.

## 2021-05-07 NOTE — Progress Notes (Signed)
DUSTI, TROW ([id number]) Visit Report for 05/05/2021 Physician Orders Details Patient Name: [name], [name]. Date of Service: 05/05/2021 8:30 AM Medical Record Number: [id number] Patient Account Number: [id number] Date of Birth/Sex: April 27, 1929 (87 y.o. F) Treating RN: [name] Primary Care Provider: Miguel [name] Other Clinician: Referring Provider: Miguel [name] Treating Provider/Extender: [name] in Treatment: 4 Verbal / Phone Orders: No Diagnosis Coding Follow-up Appointments o Return Appointment in 1 week. o Nurse Visit as needed [facility] for wound care. May utilize formulary equivalent dressing for wound treatment orders unless otherwise specified. Home Health Nurse may visit PR[facility] to address patientos wound care needs. [facility] --please use the 3 layer compression wrap and not unna boot-if questions please call wound center for appropriate layering [phone] if supplies unavailable do not remove dressing will change at next MD visit Bathing/ Shower/ Hygiene o Clean wound with Normal Saline or wound cleanser. o May shower with wound dressing protected with water repellent cover or cast protector. Edema Control - Lymphedema / Segmental Compressive Device / Other o Optional: One layer of unna paste to top of compression wrap (to act as an anchor). - do not use unna wrap on entire leg o Elevate, Exercise Daily and Avoid Standing for Long Periods of Time. o Elevate legs to the level of the heart and pump ankles as often as possible o Elevate leg(s) parallel to the floor when sitting. Wound Treatment Wound #1 - Malleolus Wound Laterality: Left, Lateral Cleanser: Normal Saline 2 x Per Week/30 Days Discharge Instructions: Wash your hands with soap and water. Remove old dressing, discard into plastic bag and place into trash. Cleanse the wound with Normal Saline prior to  applying a clean dressing using gauze sponges, not tissues or cotton balls. Do not scrub or use excessive force. Pat dry using gauze sponges, not tissue or cotton balls. Primary Dressing: Prisma 4.34 (in) 2 x Per Week/30 Days Discharge Instructions: COLLAGEN-Moisten w/normal saline or sterile water; Cover wound as directed. Do not remove from wound bed. Secondary Dressing: ABD Pad 5x9 (in/in) 2 x Per Week/30 Days Discharge Instructions: Cover dressing Compression Wrap: Profore Lite LF 3 Multilayer Compression Bandaging System 2 x Per Week/30 Days Discharge Instructions: Apply 3 multi-layer wrap as prescribed. Wound #3 - Lower Leg Wound Laterality: Left Cleanser: Normal Saline Discharge Instructions: Wash your hands with soap and water. Remove old dressing, discard into plastic bag and place into trash. Cleanse the wound with Normal Saline prior to applying a clean dressing using gauze sponges, not tissues or cotton balls. Do not scrub or use excessive force. Pat dry using gauze sponges, not tissue or cotton balls. Peri-Wound Care: Skin Prep Discharge Instructions: Use skin prep as directed Primary Dressing: steri strips Electronic Signature(s) Tammy, [name] ([id number]) Signed: 05/07/2021 8:52:54 AM By: Tammy Coria RN Signed: 05/07/2021 5:38:02 PM By: Worthy Keeler PA-C Entered By: [name] on 05/07/2021 08:52:54 [name], [name] ([id number]) -------------------------------------------------------------------------------- SuperBill Details Patient Name: [name], [name] Date of Service: 05/05/2021 Medical Record Number: [id number] Patient Account Number: [id number] Date of Birth/Sex: 12/14/1928 (87 y.o. F) Treating RN: [name] Primary Care Provider: Miguel [name] Other Clinician: Referring Provider: Miguel [name] Treating Provider/Extender: [name] in Treatment: 4 Diagnosis Coding ICD-10 Codes Code Description [phone] Laceration without  foreign body, left ankle, sequela L97.322 Non-pressure chronic ulcer of left ankle with fat layer exposed I87.2 Venous insufficiency (chronic) (peripheral) E11.621 Type 2 diabetes mellitus with  foot ulcer Facility Procedures CPT4 Code: [id number] Description: (Facility Use Only) [place] LWR LT LEG Modifier: Quantity: 1 Electronic Signature(s) Signed: 05/05/2021 2:16:09 PM By: Tammy Coria RN Signed: 05/07/2021 5:38:02 PM By: Worthy Keeler PA-C Entered By: [name] on 05/05/2021 14:16:09

## 2021-05-07 NOTE — Progress Notes (Signed)
Established patient visit   Patient: [name]   DOB: [birth year]   87 y.o. Female  MRN: [id number] Visit Date: 05/07/2021  Today's healthcare provider: Wilhemena Durie, MD   Chief Complaint  Patient presents with   Follow-up   Subjective    HPI  The only complaint the patient has today is chronic itching which seems to be worse.  Seems to be everywhere except her face.  She is taking Benadryl. Her son is with her and she continues to have visual hallucinations which really do not upset her. All other issues are stable and chronic. She is seen by endocrinology wound care center and vascular surgery.  She is also followed by cardiology and neurology.  She has a wound of her left ankle which is slowly healing but is improving.  ABI for possible PVD was normal.  Dr. Lucky Cowboy felt her vascular issue may be mesenteric ischemia but he study would follow the symptoms and work-up appropriately from symptoms.  Her abdominal discomfort is better Follow up ER visit  Patient was seen in ER for abdominal pain on 04/03/2021. She was treated for abdominal pain. Treatment for this included see notes in chart.. She reports good compliance with treatment. She reports this condition is Unchanged. Patient reports still having lower right quadrant abdominal pain.   -----------------------------------------------------------------------------------------       Medications: Outpatient Medications Prior to Visit  Medication Sig   ADVAIR HFA 230-21 MCG/ACT inhaler TAKE 2 PUFFS INTO LUNGS TWICE A DAY (Patient taking differently: Inhale 2 puffs into the lungs 2 (two) times daily.)   albuterol (VENTOLIN HFA) 108 (90 Base) MCG/ACT inhaler INHALE 2 PUFFS INTO THE LUNGS EVERY 4 HOURS AS NEEDED FOR WHEEZING ORSHORTNESS OF BREATH (Patient taking differently: Inhale 2 puffs into the lungs every 4 (four) hours as needed for wheezing or shortness of breath. INHALE 2 PUFFS INTO THE LUNGS EVERY 4 HOURS  AS NEEDED FOR WHEEZING ORSHORTNESS OF BREATH)   aspirin 325 MG tablet Take 325 mg by mouth daily. Reported on 01/05/2016   atorvastatin (LIPITOR) 10 MG tablet Take 1 tablet (10 mg total) by mouth daily.   cetirizine (ZYRTEC) 10 MG tablet TAKE ONE TABLET BY MOUTH EVERY DAY (Patient taking differently: Take 10 mg by mouth daily.)   cholecalciferol (VITAMIN D3) 25 MCG (1000 UT) tablet Take 1,000 Units by mouth daily.   clopidogrel (PLAVIX) 75 MG tablet TAKE 1 TABLET BY MOUTH DAILY   diltiazem (TIADYLT ER) 360 MG 24 hr capsule Hold until followup with your outpatient doctor due to your blood pressure on the low side.   ezetimibe (ZETIA) 10 MG tablet TAKE 1 TABLET BY MOUTH DAILY   FLUoxetine (PROZAC) 20 MG capsule Take 1 capsule (20 mg total) by mouth daily.   glucose blood test strip ACCU-CHEK AVIVA PLUS TEST STRP Use to check sugars 3 times daily.   insulin glargine (LANTUS) 100 UNIT/ML injection Inject 0.05 mLs (5 Units total) into the skin at bedtime.   insulin lispro (HUMALOG) 100 UNIT/ML KiwkPen Inject 10 Units into the skin 3 (three) times daily. 10 units before breakfast, 8 before lunch and 18 before supper   isosorbide mononitrate (IMDUR) 30 MG 24 hr tablet Take 30 mg by mouth daily.   lansoprazole (PREVACID) 30 MG capsule TAKE 1 CAPSULE BY MOUTH ONCE DAILY AT NOON (Patient taking differently: Take 30 mg by mouth daily at 12 noon. TAKE 1 CAPSULE BY MOUTH ONCE DAILY AT NOON)   losartan (  COZAAR) 100 MG tablet Take 0.5 tablets (50 mg total) by mouth daily. This is a decrease from 100 mg daily.   Magnesium 400 MG CAPS Take by mouth daily.   magnesium oxide (MAG-OX) 400 MG tablet Take 400 mg by mouth daily.   meloxicam (MOBIC) 7.5 MG tablet Take 1 tablet (7.5 mg total) by mouth daily as needed for pain.   memantine (NAMENDA) 5 MG tablet TAKE ONE TABLET TWICE DAILY (Patient taking differently: Take 5 mg by mouth 2 (two) times daily.)   montelukast (SINGULAIR) 10 MG tablet TAKE ONE TABLET AT BEDTIME  (Patient taking differently: Take 10 mg by mouth at bedtime.)   ondansetron (ZOFRAN-ODT) 4 MG disintegrating tablet Take 4 mg by mouth every 8 (eight) hours as needed for nausea. For up to 12 doses.   pantoprazole (PROTONIX) 40 MG tablet Take 40 mg by mouth daily.   QUEtiapine (SEROQUEL) 25 MG tablet Take 1 tablet by mouth at bedtime.   traZODone (DESYREL) 50 MG tablet TAKE 1 AND 1/2 TABLET AT BEDTIME AS NEEDED FOR SLEEP   TUSSIN DM 100-10 MG/5ML liquid TAKE 1 TEASPOONFUL BY MOUTH EVERY 4 HOURS AS NEEDED FOR COUGH   vitamin B-12 1000 MCG tablet Take 1 tablet (1,000 mcg total) by mouth daily.   vitamin E 1000 UNIT capsule Take 1,000 Units by mouth daily.   iron polysaccharides (NIFEREX) 150 MG capsule Take 1 capsule (150 mg total) by mouth daily.   No facility-administered medications prior to visit.    Review of Systems  Constitutional:  Negative for appetite change, chills, fatigue and fever.  Respiratory:  Negative for chest tightness and shortness of breath.   Cardiovascular:  Negative for chest pain and palpitations.  Gastrointestinal:  Positive for abdominal pain. Negative for nausea and vomiting.  Neurological:  Negative for dizziness and weakness.       Objective    BP (!) 174/78 (BP Location: Left Arm, Patient Position: Sitting, Cuff Size: Normal)   Pulse 92   Temp 98.4 F (36.9 C) (Temporal)   Resp 18   Wt 142 lb 6.4 oz (64.6 kg)   BMI 26.05 kg/m  BP Readings from Last 3 Encounters:  05/07/21 (!) 174/78  04/07/21 (!) 176/73  04/03/21 (!) 177/89   Wt Readings from Last 3 Encounters:  05/07/21 142 lb 6.4 oz (64.6 kg)  04/07/21 140 lb 3.2 oz (63.6 kg)  02/09/21 145 lb 8.1 oz (66 kg)     Today's Vitals   05/07/21 1058 05/07/21 1100  BP: (!) 157/73 (!) 174/78  Pulse: 92   Resp: 18   Temp: 98.4 F (36.9 C)   TempSrc: Temporal   Weight: 142 lb 6.4 oz (64.6 kg)    Body mass index is 26.05 kg/m.    Physical Exam Vitals reviewed.  Constitutional:       Appearance: She is well-developed.  HENT:     Head: Normocephalic and atraumatic.  Eyes:     General: No scleral icterus.    Conjunctiva/sclera: Conjunctivae normal.  Neck:     Thyroid: No thyromegaly.     Vascular: No carotid bruit.     Comments: She has bilateral carotid bruit. Cardiovascular:     Rate and Rhythm: Normal rate and regular rhythm.  Pulmonary:     Effort: Pulmonary effort is normal.  Abdominal:     Palpations: Abdomen is soft.  Skin:    General: Skin is warm and dry.     Findings: Laceration present.  Comments: Very fair skin. She definitely is having areas of excoriation on her extremities.  Neurological:     Mental Status: She is alert and oriented to person, place, and time. Mental status is at baseline.  Psychiatric:        Mood and Affect: Mood normal.        Behavior: Behavior normal.      No results found for any visits on 05/07/21.  Assessment & Plan     1. Benign essential HTN Check blood pressures at home.  Hold diltiazem and losartan.  May need to increase diltiazem dose. - Lipid panel - CBC w/Diff/Platelet - Comprehensive Metabolic Panel (CMET) - TSH  2. Hypercholesteremia On atorvastatin 10 mg daily - Lipid panel - CBC w/Diff/Platelet - Comprehensive Metabolic Panel (CMET) - TSH  3. Type 2 diabetes mellitus with diabetic nephropathy, with long-term current use of insulin (HCC) Followed by endocrinology - Lipid panel - CBC w/Diff/Platelet - Comprehensive Metabolic Panel (CMET) - TSH  4. Dermatitis Topical Caladryl. - Lipid panel - CBC w/Diff/Platelet - Comprehensive Metabolic Panel (CMET) - TSH  5. Type 2 diabetes mellitus with vascular disease (HCC) Diffuse vascular disease  6. Bilateral carotid artery stenosis Followed by vascular surgery  7. Chronic obstructive pulmonary disease, unspecified COPD type ([place])   8. Gastroesophageal reflux disease, unspecified whether esophagitis present Pantoprazole daily  9. Late  onset Alzheimer's dementia without behavioral disturbance ([place]) This looks more like Lewy body dementia.  She continues to have hallucinations which are not bothersome. Followed by neurology also.  Follow-up in 3 to 4 months. 10. Hallucinations    Return in about 3 months (around 08/07/2021).      I, Wilhemena Durie, MD, have reviewed all documentation for this visit. The documentation on 05/17/21 for the exam, diagnosis, procedures, and orders are all accurate and complete.    Saya Mccoll Cranford Mon, MD  [facility] [phone] (phone) [phone] (fax)  [place]

## 2021-05-08 DIAGNOSIS — J449 Chronic obstructive pulmonary disease, unspecified: Secondary | ICD-10-CM | POA: Diagnosis not present

## 2021-05-08 DIAGNOSIS — K219 Gastro-esophageal reflux disease without esophagitis: Secondary | ICD-10-CM | POA: Diagnosis not present

## 2021-05-08 DIAGNOSIS — I129 Hypertensive chronic kidney disease with stage 1 through stage 4 chronic kidney disease, or unspecified chronic kidney disease: Secondary | ICD-10-CM | POA: Diagnosis not present

## 2021-05-08 DIAGNOSIS — I10 Essential (primary) hypertension: Secondary | ICD-10-CM | POA: Diagnosis not present

## 2021-05-08 DIAGNOSIS — E1151 Type 2 diabetes mellitus with diabetic peripheral angiopathy without gangrene: Secondary | ICD-10-CM | POA: Diagnosis not present

## 2021-05-08 DIAGNOSIS — M5136 Other intervertebral disc degeneration, lumbar region: Secondary | ICD-10-CM | POA: Diagnosis not present

## 2021-05-08 DIAGNOSIS — G4700 Insomnia, unspecified: Secondary | ICD-10-CM | POA: Diagnosis not present

## 2021-05-08 DIAGNOSIS — S91012D Laceration without foreign body, left ankle, subsequent encounter: Secondary | ICD-10-CM | POA: Diagnosis not present

## 2021-05-08 DIAGNOSIS — E1142 Type 2 diabetes mellitus with diabetic polyneuropathy: Secondary | ICD-10-CM | POA: Diagnosis not present

## 2021-05-08 DIAGNOSIS — I2510 Atherosclerotic heart disease of native coronary artery without angina pectoris: Secondary | ICD-10-CM | POA: Diagnosis not present

## 2021-05-08 DIAGNOSIS — L309 Dermatitis, unspecified: Secondary | ICD-10-CM | POA: Diagnosis not present

## 2021-05-08 DIAGNOSIS — E7800 Pure hypercholesterolemia, unspecified: Secondary | ICD-10-CM | POA: Diagnosis not present

## 2021-05-08 DIAGNOSIS — I480 Paroxysmal atrial fibrillation: Secondary | ICD-10-CM | POA: Diagnosis not present

## 2021-05-08 DIAGNOSIS — Z7902 Long term (current) use of antithrombotics/antiplatelets: Secondary | ICD-10-CM | POA: Diagnosis not present

## 2021-05-08 DIAGNOSIS — Z7982 Long term (current) use of aspirin: Secondary | ICD-10-CM | POA: Diagnosis not present

## 2021-05-08 DIAGNOSIS — Z794 Long term (current) use of insulin: Secondary | ICD-10-CM | POA: Diagnosis not present

## 2021-05-08 DIAGNOSIS — E1122 Type 2 diabetes mellitus with diabetic chronic kidney disease: Secondary | ICD-10-CM | POA: Diagnosis not present

## 2021-05-08 DIAGNOSIS — E1121 Type 2 diabetes mellitus with diabetic nephropathy: Secondary | ICD-10-CM | POA: Diagnosis not present

## 2021-05-08 DIAGNOSIS — Z85828 Personal history of other malignant neoplasm of skin: Secondary | ICD-10-CM | POA: Diagnosis not present

## 2021-05-08 DIAGNOSIS — R1310 Dysphagia, unspecified: Secondary | ICD-10-CM | POA: Diagnosis not present

## 2021-05-08 DIAGNOSIS — Z7951 Long term (current) use of inhaled steroids: Secondary | ICD-10-CM | POA: Diagnosis not present

## 2021-05-08 DIAGNOSIS — N1831 Chronic kidney disease, stage 3a: Secondary | ICD-10-CM | POA: Diagnosis not present

## 2021-05-08 DIAGNOSIS — D631 Anemia in chronic kidney disease: Secondary | ICD-10-CM | POA: Diagnosis not present

## 2021-05-08 DIAGNOSIS — F32A Depression, unspecified: Secondary | ICD-10-CM | POA: Diagnosis not present

## 2021-05-08 NOTE — Progress Notes (Signed)
JULIEANNE, KASEY ([id number]) Visit Report for 05/05/2021 Arrival Information Details Patient Name: [name], [name]. Date of Service: 05/05/2021 8:30 AM Medical Record Number: [id number] Patient Account Number: [id number] Date of Birth/Sex: 08/14/1929 (87 y.o. F) Treating RN: Carlene Coria Primary Care Matty Vanroekel: Miguel Aschoff Other Clinician: Referring Dinita Migliaccio: Miguel Aschoff Treating Lindell Renfrew/Extender: Skipper Cliche in Treatment: 4 Visit Information History Since Last Visit All ordered tests and consults were completed: No Patient Arrived: Ambulatory Added or deleted any medications: No Arrival Time: 08:51 Any new allergies or adverse reactions: No Accompanied By: self Had a fall or experienced change in No Transfer Assistance: None activities of daily living that may affect Patient Identification Verified: Yes risk of falls: Secondary Verification Process Completed: Yes Signs or symptoms of abuse/neglect since last visito No Patient Requires Transmission-Based No Hospitalized since last visit: No Precautions: Implantable device outside of the clinic excluding No Patient Has Alerts: Yes cellular tissue based products placed in the center Patient Alerts: Patient on Blood Thinner since last visit: ***PLAVIX*** Has Dressing in Place as Prescribed: Yes ALLERGIC TO Has Compression in Place as Prescribed: Yes LIDOCAINE Pain Present Now: No Electronic Signature(s) Signed: 05/07/2021 8:40:56 PM By: Carlene Coria RN Entered By: Carlene Coria on 05/05/2021 08:52:03 Feick, [place]. ([id number]) -------------------------------------------------------------------------------- Clinic Level of Care Assessment Details Patient Name: Hsia, Azora L. Date of Service: 05/05/2021 8:30 AM Medical Record Number: [id number] Patient Account Number: [id number] Date of Birth/Sex: 05/28/1929 (87 y.o. F) Treating RN: Carlene Coria Primary Care Shiniqua Groseclose: Miguel Aschoff  Other Clinician: Referring Mirna Sutcliffe: Miguel Aschoff Treating Shenae Bonanno/Extender: Skipper Cliche in Treatment: 4 Clinic Level of Care Assessment Items TOOL 1 Quantity Score '[]'$  - Use when EandM and Procedure is performed on INITIAL visit 0 ASSESSMENTS - Nursing Assessment / Reassessment '[]'$  - General Physical Exam (combine w/ comprehensive assessment (listed just below) when performed on new 0 pt. evals) '[]'$  - 0 Comprehensive Assessment (HX, ROS, Risk Assessments, Wounds Hx, etc.) ASSESSMENTS - Wound and Skin Assessment / Reassessment '[]'$  - Dermatologic / Skin Assessment (not related to wound area) 0 ASSESSMENTS - Ostomy and/or Continence Assessment and Care '[]'$  - Incontinence Assessment and Management 0 '[]'$  - 0 Ostomy Care Assessment and Management (repouching, etc.) PROCESS - Coordination of Care '[]'$  - Simple Patient / Family Education for ongoing care 0 '[]'$  - 0 Complex (extensive) Patient / Family Education for ongoing care '[]'$  - 0 Staff obtains Programmer, systems, Records, Test Results / Process Orders '[]'$  - 0 Staff telephones HHA, Nursing Homes / Clarify orders / etc '[]'$  - 0 Routine Transfer to another Facility (non-emergent condition) '[]'$  - 0 Routine Hospital Admission (non-emergent condition) '[]'$  - 0 New Admissions / Biomedical engineer / Ordering NPWT, Apligraf, etc. '[]'$  - 0 Emergency Hospital Admission (emergent condition) PROCESS - Special Needs '[]'$  - Pediatric / Minor Patient Management 0 '[]'$  - 0 Isolation Patient Management '[]'$  - 0 Hearing / Language / Visual special needs '[]'$  - 0 Assessment of Community assistance (transportation, D/C planning, etc.) '[]'$  - 0 Additional assistance / Altered mentation '[]'$  - 0 Support Surface(s) Assessment (bed, cushion, seat, etc.) INTERVENTIONS - Miscellaneous '[]'$  - External ear exam 0 '[]'$  - 0 Patient Transfer (multiple staff / Civil Service fast streamer / Similar devices) '[]'$  - 0 Simple Staple / Suture removal (25 or less) '[]'$  - 0 Complex Staple / Suture  removal (26 or more) '[]'$  - 0 Hypo/Hyperglycemic Management (do not check if billed separately) '[]'$  - 0 Ankle / Brachial Index (ABI) - do not check if billed separately Has  the patient been seen at the hospital within the last three years: Yes Total Score: 0 Level Of Care: ____ [name] ([id number]) Electronic Signature(s) Signed: 05/07/2021 8:40:56 PM By: Carlene Coria RN Entered By: Carlene Coria on 05/05/2021 14:15:37 Leiphart, Ressie L. ([id number]) -------------------------------------------------------------------------------- Compression Therapy Details Patient Name: Pankowski, Mallorie L. Date of Service: 05/05/2021 8:30 AM Medical Record Number: [id number] Patient Account Number: [id number] Date of Birth/Sex: [birth year] (87 y.o. F) Treating RN: Carlene Coria Primary Care Quinley Nesler: Miguel Aschoff Other Clinician: Referring Leanza Shepperson: Miguel Aschoff Treating Jonnell Hentges/Extender: Skipper Cliche in Treatment: 4 Compression Therapy Performed for Wound Assessment: Wound #1 Left,Lateral Malleolus Performed By: Jake Church, RN Compression Type: Three Layer Electronic Signature(s) Signed: 05/05/2021 2:10:57 PM By: Carlene Coria RN Entered By: Carlene Coria on 05/05/2021 14:10:57 Henshaw, Bassy Carlean Jews ([id number]) -------------------------------------------------------------------------------- Compression Therapy Details Patient Name: Whitenack, Kashina L. Date of Service: 05/05/2021 8:30 AM Medical Record Number: [id number] Patient Account Number: [id number] Date of Birth/Sex: 1929-05-30 (87 y.o. F) Treating RN: Carlene Coria Primary Care Lulu Hirschmann: Miguel Aschoff Other Clinician: Referring Abb Gobert: Miguel Aschoff Treating Lyla Jasek/Extender: Skipper Cliche in Treatment: 4 Compression Therapy Performed for Wound Assessment: Wound #3 Left Lower Leg Performed By: Clinician Carlene Coria, RN Compression Type: Three Layer Electronic Signature(s) Signed:  05/05/2021 2:10:58 PM By: Carlene Coria RN Entered By: Carlene Coria on 05/05/2021 14:10:57 Raineri, Nathanial Rancher ([id number]) -------------------------------------------------------------------------------- Encounter Discharge Information Details Patient Name: [name] Date of Service: 05/05/2021 8:30 AM Medical Record Number: [id number] Patient Account Number: [id number] Date of Birth/Sex: 04-25-1929 (87 y.o. F) Treating RN: Carlene Coria Primary Care Lyndsey Demos: Miguel Aschoff Other Clinician: Referring Canon Gola: Miguel Aschoff Treating Armonie Mettler/Extender: Skipper Cliche in Treatment: 4 Encounter Discharge Information Items Discharge Condition: Stable Ambulatory Status: Walker Discharge Destination: Home Transportation: Private Auto Accompanied By: self Schedule Follow-up Appointment: Yes Clinical Summary of Care: Patient Declined Electronic Signature(s) Signed: 05/05/2021 2:15:18 PM By: Carlene Coria RN Entered By: Carlene Coria on 05/05/2021 14:15:18 Delair, Nathanial Rancher ([id number]) -------------------------------------------------------------------------------- General Visit Notes Details Patient Name: Vanecek, Antanette L. Date of Service: 05/05/2021 8:30 AM Medical Record Number: [id number] Patient Account Number: [id number] Date of Birth/Sex: [birth year] (87 y.o. F) Treating RN: Carlene Coria Primary Care Bairon Klemann: Miguel Aschoff Other Clinician: Referring Marolyn Urschel: Miguel Aschoff Treating Renesmee Raine/Extender: Skipper Cliche in Treatment: 4 Notes while taking patient dressing off caused a skin tear to the left anterior proximal upper leg, notified Jeri Cos PA, with new orders recieved, cleaned area with normal saline, edges approximated , skin prep applied to peri wound and steri strips applied, patient tolerated well Electronic Signature(s) Signed: 05/06/2021 1:04:58 PM By: Carlene Coria RN Entered By: Carlene Coria on 05/06/2021 13:04:57 Lechtenberg, Fusaye  L. ([id number]) -------------------------------------------------------------------------------- Wound Assessment Details Patient Name: Maclaren, Esmay L. Date of Service: 05/05/2021 8:30 AM Medical Record Number: [id number] Patient Account Number: [id number] Date of Birth/Sex: 07/27/1929 (87 y.o. F) Treating RN: Carlene Coria Primary Care Izzy Doubek: Miguel Aschoff Other Clinician: Referring Kodah Maret: Miguel Aschoff Treating Ailee Pates/Extender: Skipper Cliche in Treatment: 4 Wound Status Wound Number: 1 Primary Dehisced Wound Etiology: Wound Location: Left, Lateral Malleolus Wound Open Wounding Event: Surgical Injury Status: Date Acquired: 02/08/2021 Comorbid Chronic Obstructive Pulmonary Disease (COPD), Weeks Of Treatment: 4 History: Arrhythmia, Coronary Artery Disease, Hypertension, Type Clustered Wound: No II Diabetes, Osteoarthritis, Neuropathy Wound Measurements Length: (cm) 0.3 Width: (cm) 1.5 Depth: (cm) 0.5 Area: (cm) 0.353 Volume: (cm) 0.177 % Reduction in Area: 62.5% % Reduction in Volume: 76.5% Epithelialization: None Tunneling: No Undermining: No Wound Description Classification: Full Thickness  Without Exposed Support Structures Exudate Amount: Medium Exudate Type: Serosanguineous Exudate Color: red, brown Foul Odor After Cleansing: No Slough/Fibrino Yes Wound Bed Granulation Amount: Medium (34-66%) Exposed Structure Granulation Quality: Red, Pink Fascia Exposed: No Necrotic Amount: Medium (34-66%) Fat Layer (Subcutaneous Tissue) Exposed: Yes Necrotic Quality: Adherent Slough Tendon Exposed: No Muscle Exposed: No Joint Exposed: No Bone Exposed: No Treatment Notes Wound #1 (Malleolus) Wound Laterality: Left, Lateral Cleanser Normal Saline Discharge Instruction: Wash your hands with soap and water. Remove old dressing, discard into plastic bag and place into trash. Cleanse the wound with Normal Saline prior to applying a clean dressing using  gauze sponges, not tissues or cotton balls. Do not scrub or use excessive force. Pat dry using gauze sponges, not tissue or cotton balls. Peri-Wound Care Topical Primary Dressing Prisma 4.34 (in) Discharge Instruction: COLLAGEN-Moisten w/normal saline or sterile water; Cover wound as directed. Do not remove from wound bed. Secondary Dressing ABD Pad 5x9 (in/in) Discharge Instruction: Cover dressing Secured With KARLEAH, ADOLF. ([id number]) Compression Wrap Profore Lite LF 3 Multilayer Compression Bandaging System Discharge Instruction: Apply 3 multi-layer wrap as prescribed. Compression Stockings Add-Ons Electronic Signature(s) Signed: 05/07/2021 8:40:56 PM By: Carlene Coria RN Entered By: Carlene Coria on 05/05/2021 08:54:55 Nerio, Aleia Carlean Jews ([id number]) -------------------------------------------------------------------------------- Wound Assessment Details Patient Name: Zenon, Olukemi L. Date of Service: 05/05/2021 8:30 AM Medical Record Number: [id number] Patient Account Number: [id number] Date of Birth/Sex: April 09, 1929 (87 y.o. F) Treating RN: Carlene Coria Primary Care Rayshaun Needle: Miguel Aschoff Other Clinician: Referring Azalee Weimer: Miguel Aschoff Treating Analena Gama/Extender: Skipper Cliche in Treatment: 4 Wound Status Wound Number: 3 Primary Skin Tear Etiology: Wound Location: Left Lower Leg Wound Open Wounding Event: Skin Tear/Laceration Status: Date Acquired: 05/05/2021 Comorbid Chronic Obstructive Pulmonary Disease (COPD), Weeks Of Treatment: 0 History: Arrhythmia, Coronary Artery Disease, Hypertension, Type Clustered Wound: No II Diabetes, Osteoarthritis, Neuropathy Wound Measurements Length: (cm) 2.5 Width: (cm) 1 Depth: (cm) 0.1 Area: (cm) 1.963 Volume: (cm) 0.196 % Reduction in Area: 0% % Reduction in Volume: 0% Epithelialization: None Tunneling: No Undermining: No Wound Description Classification: Partial Thickness Exudate Amount:  Small Exudate Type: Sanguinous Exudate Color: red Foul Odor After Cleansing: No Slough/Fibrino No Wound Bed Granulation Amount: None Present (0%) Exposed Structure Necrotic Amount: None Present (0%) Fascia Exposed: No Fat Layer (Subcutaneous Tissue) Exposed: No Tendon Exposed: No Muscle Exposed: No Joint Exposed: No Bone Exposed: No Treatment Notes Wound #3 (Lower Leg) Wound Laterality: Left Cleanser Peri-Wound Care Topical Primary Dressing Secondary Dressing Secured With Compression Wrap Compression Stockings Add-Ons Electronic Signature(s) Signed: 05/06/2021 1:05:41 PM By: Carlene Coria RN Entered By: Carlene Coria on 05/06/2021 13:05:41

## 2021-05-09 LAB — CBC WITH DIFFERENTIAL/PLATELET
Basophils Absolute: 0.1 10*3/uL (ref 0.0–0.2)
Basos: 1 %
EOS (ABSOLUTE): 0.8 10*3/uL — ABNORMAL HIGH (ref 0.0–0.4)
Eos: 10 %
Hematocrit: 32.2 % — ABNORMAL LOW (ref 34.0–46.6)
Hemoglobin: 9.6 g/dL — ABNORMAL LOW (ref 11.1–15.9)
Immature Grans (Abs): 0 10*3/uL (ref 0.0–0.1)
Immature Granulocytes: 0 %
Lymphocytes Absolute: 2.1 10*3/uL (ref 0.7–3.1)
Lymphs: 25 %
MCH: 23 pg — ABNORMAL LOW (ref 26.6–33.0)
MCHC: 29.8 g/dL — ABNORMAL LOW (ref 31.5–35.7)
MCV: 77 fL — ABNORMAL LOW (ref 79–97)
Monocytes Absolute: 0.9 10*3/uL (ref 0.1–0.9)
Monocytes: 11 %
Neutrophils Absolute: 4.3 10*3/uL (ref 1.4–7.0)
Neutrophils: 53 %
Platelets: 331 10*3/uL (ref 150–450)
RBC: 4.17 x10E6/uL (ref 3.77–5.28)
RDW: 17.1 % — ABNORMAL HIGH (ref 11.7–15.4)
WBC: 8.2 10*3/uL (ref 3.4–10.8)

## 2021-05-09 LAB — COMPREHENSIVE METABOLIC PANEL
ALT: 9 IU/L (ref 0–32)
AST: 19 IU/L (ref 0–40)
Albumin/Globulin Ratio: 2 (ref 1.2–2.2)
Albumin: 4.1 g/dL (ref 3.5–4.6)
Alkaline Phosphatase: 97 IU/L (ref 44–121)
BUN/Creatinine Ratio: 15 (ref 12–28)
BUN: 14 mg/dL (ref 10–36)
Bilirubin Total: 0.5 mg/dL (ref 0.0–1.2)
CO2: 27 mmol/L (ref 20–29)
Calcium: 9.2 mg/dL (ref 8.7–10.3)
Chloride: 96 mmol/L (ref 96–106)
Creatinine, Ser: 0.93 mg/dL (ref 0.57–1.00)
Globulin, Total: 2.1 g/dL (ref 1.5–4.5)
Glucose: 122 mg/dL — ABNORMAL HIGH (ref 65–99)
Potassium: 4.4 mmol/L (ref 3.5–5.2)
Sodium: 136 mmol/L (ref 134–144)
Total Protein: 6.2 g/dL (ref 6.0–8.5)
eGFR: 58 mL/min/{1.73_m2} — ABNORMAL LOW (ref >59)

## 2021-05-09 LAB — LIPID PANEL
Chol/HDL Ratio: 2.7 ratio (ref 0.0–4.4)
Cholesterol, Total: 118 mg/dL (ref 100–199)
HDL: 44 mg/dL (ref >39)
LDL Chol Calc (NIH): 41 mg/dL (ref 0–99)
Triglycerides: 209 mg/dL — ABNORMAL HIGH (ref 0–149)
VLDL Cholesterol Cal: 33 mg/dL (ref 5–40)

## 2021-05-09 LAB — TSH: TSH: 1.79 u[IU]/mL (ref 0.450–4.500)

## 2021-05-11 DIAGNOSIS — R1310 Dysphagia, unspecified: Secondary | ICD-10-CM | POA: Diagnosis not present

## 2021-05-11 DIAGNOSIS — M5136 Other intervertebral disc degeneration, lumbar region: Secondary | ICD-10-CM | POA: Diagnosis not present

## 2021-05-11 DIAGNOSIS — Z7951 Long term (current) use of inhaled steroids: Secondary | ICD-10-CM | POA: Diagnosis not present

## 2021-05-11 DIAGNOSIS — Z85828 Personal history of other malignant neoplasm of skin: Secondary | ICD-10-CM | POA: Diagnosis not present

## 2021-05-11 DIAGNOSIS — Z7982 Long term (current) use of aspirin: Secondary | ICD-10-CM | POA: Diagnosis not present

## 2021-05-11 DIAGNOSIS — E7800 Pure hypercholesterolemia, unspecified: Secondary | ICD-10-CM | POA: Diagnosis not present

## 2021-05-11 DIAGNOSIS — D631 Anemia in chronic kidney disease: Secondary | ICD-10-CM | POA: Diagnosis not present

## 2021-05-11 DIAGNOSIS — Z7902 Long term (current) use of antithrombotics/antiplatelets: Secondary | ICD-10-CM | POA: Diagnosis not present

## 2021-05-11 DIAGNOSIS — E1142 Type 2 diabetes mellitus with diabetic polyneuropathy: Secondary | ICD-10-CM | POA: Diagnosis not present

## 2021-05-11 DIAGNOSIS — E1151 Type 2 diabetes mellitus with diabetic peripheral angiopathy without gangrene: Secondary | ICD-10-CM | POA: Diagnosis not present

## 2021-05-11 DIAGNOSIS — I2510 Atherosclerotic heart disease of native coronary artery without angina pectoris: Secondary | ICD-10-CM | POA: Diagnosis not present

## 2021-05-11 DIAGNOSIS — G4700 Insomnia, unspecified: Secondary | ICD-10-CM | POA: Diagnosis not present

## 2021-05-11 DIAGNOSIS — F32A Depression, unspecified: Secondary | ICD-10-CM | POA: Diagnosis not present

## 2021-05-11 DIAGNOSIS — N1831 Chronic kidney disease, stage 3a: Secondary | ICD-10-CM | POA: Diagnosis not present

## 2021-05-11 DIAGNOSIS — K219 Gastro-esophageal reflux disease without esophagitis: Secondary | ICD-10-CM | POA: Diagnosis not present

## 2021-05-11 DIAGNOSIS — Z794 Long term (current) use of insulin: Secondary | ICD-10-CM | POA: Diagnosis not present

## 2021-05-11 DIAGNOSIS — I480 Paroxysmal atrial fibrillation: Secondary | ICD-10-CM | POA: Diagnosis not present

## 2021-05-11 DIAGNOSIS — E1122 Type 2 diabetes mellitus with diabetic chronic kidney disease: Secondary | ICD-10-CM | POA: Diagnosis not present

## 2021-05-11 DIAGNOSIS — J449 Chronic obstructive pulmonary disease, unspecified: Secondary | ICD-10-CM | POA: Diagnosis not present

## 2021-05-11 DIAGNOSIS — S91012D Laceration without foreign body, left ankle, subsequent encounter: Secondary | ICD-10-CM | POA: Diagnosis not present

## 2021-05-11 DIAGNOSIS — I129 Hypertensive chronic kidney disease with stage 1 through stage 4 chronic kidney disease, or unspecified chronic kidney disease: Secondary | ICD-10-CM | POA: Diagnosis not present

## 2021-05-12 ENCOUNTER — Other Ambulatory Visit: Payer: Self-pay

## 2021-05-12 ENCOUNTER — Encounter: Payer: Medicare Other | Admitting: Physician Assistant

## 2021-05-12 DIAGNOSIS — L97322 Non-pressure chronic ulcer of left ankle with fat layer exposed: Secondary | ICD-10-CM | POA: Diagnosis not present

## 2021-05-12 DIAGNOSIS — I480 Paroxysmal atrial fibrillation: Secondary | ICD-10-CM | POA: Diagnosis not present

## 2021-05-12 DIAGNOSIS — L97822 Non-pressure chronic ulcer of other part of left lower leg with fat layer exposed: Secondary | ICD-10-CM | POA: Diagnosis not present

## 2021-05-12 DIAGNOSIS — S91012D Laceration without foreign body, left ankle, subsequent encounter: Secondary | ICD-10-CM | POA: Diagnosis not present

## 2021-05-12 DIAGNOSIS — T8131XA Disruption of external operation (surgical) wound, not elsewhere classified, initial encounter: Secondary | ICD-10-CM | POA: Diagnosis not present

## 2021-05-12 DIAGNOSIS — E11622 Type 2 diabetes mellitus with other skin ulcer: Secondary | ICD-10-CM | POA: Diagnosis not present

## 2021-05-12 DIAGNOSIS — I872 Venous insufficiency (chronic) (peripheral): Secondary | ICD-10-CM | POA: Diagnosis not present

## 2021-05-12 DIAGNOSIS — Z794 Long term (current) use of insulin: Secondary | ICD-10-CM | POA: Diagnosis not present

## 2021-05-12 NOTE — Progress Notes (Addendum)
Tammy, [name] ([id number]) Visit Report for 05/12/2021 Arrival Information Details Patient Name: [name], [name]. Date of Service: 05/12/2021 8:00 AM Medical Record Number: [id number] Patient Account Number: [id number] Date of Birth/Sex: 11-21-1928 (87 y.o. F) Treating [name]: [name] Primary Care [name]: [name] Other Clinician: Referring [name]: [name] Treating [name]/Extender: [name] in Treatment: 5 Visit Information History Since Last Visit All ordered tests and consults were completed: No Patient Arrived: Ambulatory Added or deleted any medications: No Arrival Time: 08:14 Any new allergies or adverse reactions: No Accompanied By: self Had a fall or experienced change in No Transfer Assistance: None activities of daily living that may affect Patient Identification Verified: Yes risk of falls: Secondary Verification Process Completed: Yes Signs or symptoms of abuse/neglect since last visito No Patient Requires Transmission-Based No Hospitalized since last visit: No Precautions: Implantable device outside of the clinic excluding No Patient Has Alerts: Yes cellular tissue based products placed in the center Patient Alerts: Patient on Blood Thinner since last visit: ***PLAVIX*** Has Dressing in Place as Prescribed: Yes ALLERGIC TO Has Compression in Place as Prescribed: Yes LIDOCAINE Pain Present Now: No Electronic Signature(s) Signed: 05/12/2021 8:31:56 AM By: [name] Entered By: [name] on 05/12/2021 08:15:13 Suydam, [name]. ([id number]) -------------------------------------------------------------------------------- Clinic Level of Care Assessment Details Patient Name: [name], [name] Date of Service: 05/12/2021 8:00 AM Medical Record Number: [id number] Patient Account Number: [id number] Date of Birth/Sex: 10-Feb-1929 (87 y.o. F) Treating [name]: [name] Primary Care Reno Clasby: [name]  Other Clinician: Referring [name]: [name] Treating [name]/Extender: [name] in Treatment: 5 Clinic Level of Care Assessment Items TOOL 4 Quantity Score X - Use when only [name] EandM is performed on FOLLOW-UP visit 1 0 ASSESSMENTS - Nursing Assessment / Reassessment X - Reassessment of Co-morbidities (includes updates in patient status) 1 10 X- 1 5 Reassessment of Adherence to Treatment Plan ASSESSMENTS - Wound and Skin Assessment / Reassessment X - Simple Wound Assessment / Reassessment - one wound 1 5 []  - 0 Complex Wound Assessment / Reassessment - multiple wounds []  - 0 Dermatologic / Skin Assessment (not related to wound area) ASSESSMENTS - Focused Assessment []  - Circumferential Edema Measurements - multi extremities 0 []  - 0 Nutritional Assessment / Counseling / Intervention []  - 0 Lower Extremity Assessment (monofilament, tuning fork, pulses) []  - 0 Peripheral Arterial Disease Assessment (using hand held doppler) ASSESSMENTS - Ostomy and/or Continence Assessment and Care []  - Incontinence Assessment and Management 0 []  - 0 Ostomy Care Assessment and Management (repouching, etc.) PROCESS - Coordination of Care X - Simple Patient / Family Education for ongoing care 1 15 []  - 0 Complex (extensive) Patient / Family Education for ongoing care []  - 0 Staff obtains Programmer, systems, Records, Test Results / Process Orders []  - 0 Staff telephones HHA, Nursing Homes / Clarify orders / etc []  - 0 Routine Transfer to another Facility (non-emergent condition) []  - 0 Routine Hospital Admission (non-emergent condition) []  - 0 New Admissions / Biomedical engineer / Ordering NPWT, Apligraf, etc. []  - 0 Emergency Hospital Admission (emergent condition) X- 1 10 Simple Discharge Coordination []  - 0 Complex (extensive) Discharge Coordination PROCESS - Special Needs []  - Pediatric / Minor Patient Management 0 []  - 0 Isolation Patient Management []  - 0 Hearing  / Language / Visual special needs []  - 0 Assessment of Community assistance (transportation, D/C planning, etc.) []  - 0 Additional assistance / Altered mentation []  - 0 Support Surface(s) Assessment (bed, cushion, seat, etc.)  INTERVENTIONS - Wound Cleansing / Measurement [name], [name] ([id number]) X- 1 5 Simple Wound Cleansing - one wound []  - 0 Complex Wound Cleansing - multiple wounds X- 1 5 Wound Imaging (photographs - any number of wounds) []  - 0 Wound Tracing (instead of photographs) X- 1 5 Simple Wound Measurement - one wound []  - 0 Complex Wound Measurement - multiple wounds INTERVENTIONS - Wound Dressings X - Small Wound Dressing one or multiple wounds 1 10 []  - 0 Medium Wound Dressing one or multiple wounds []  - 0 Large Wound Dressing one or multiple wounds X- 1 5 Application of Medications - topical []  - 0 Application of Medications - injection INTERVENTIONS - Miscellaneous []  - External ear exam 0 []  - 0 Specimen Collection (cultures, biopsies, blood, body fluids, etc.) []  - 0 Specimen(s) / Culture(s) sent or taken to Lab for analysis []  - 0 Patient Transfer (multiple staff / Civil Service fast streamer / Similar devices) []  - 0 Simple Staple / Suture removal (25 or less) []  - 0 Complex Staple / Suture removal (26 or more) []  - 0 Hypo / Hyperglycemic Management (close monitor of Blood Glucose) []  - 0 Ankle / Brachial Index (ABI) - do not check if billed separately X- 1 5 Vital Signs Has the patient been seen at the hospital within the last three years: Yes Total Score: 80 Level Of Care: New/Established - Level 3 Electronic Signature(s) Signed: 05/12/2021 5:27:33 PM By: [name] Entered By: [name] on 05/12/2021 08:45:48 [name], [name] ([id number]) -------------------------------------------------------------------------------- Compression Therapy Details Patient Name: [name], [name] Date of Service: 05/12/2021 8:00 AM Medical  Record Number: [id number] Patient Account Number: [id number] Date of Birth/Sex: 1929-04-22 (87 y.o. F) Treating [name]: [name] Primary Care Kylian Loh: [name] Other Clinician: Referring Demar Shad: [name] Treating Dolce Sylvia/Extender: [name] in Treatment: 5 Compression Therapy Performed for Wound Assessment: Wound #1 Left,Lateral Malleolus Performed By: Jake Church, [name] Compression Type: Three Layer Post Procedure Diagnosis Same as Pre-procedure Electronic Signature(s) Signed: 05/12/2021 5:27:33 PM By: [name] Entered By: [name] on 05/12/2021 08:36:42 Groene, Germani Carlean [name] ([id number]) -------------------------------------------------------------------------------- Compression Therapy Details Patient Name: Laker, [name] Date of Service: 05/12/2021 8:00 AM Medical Record Number: [id number] Patient Account Number: [id number] Date of Birth/Sex: [birth year] (87 y.o. F) Treating [name]: [name] Primary Care Elgar Scoggins: [name] Other Clinician: Referring Albirda Shiel: [name] Treating Kadarrius Yanke/Extender: [name] in Treatment: 5 Compression Therapy Performed for Wound Assessment: Wound #3 Left Lower Leg Performed By: Clinician Tammy Coria, [name] Compression Type: Three Layer Post Procedure Diagnosis Same as Pre-procedure Electronic Signature(s) Signed: 05/12/2021 5:27:33 PM By: [name] Entered By: [name] on 05/12/2021 08:37:13 Wilms, [name] Kitchen ([id number]) -------------------------------------------------------------------------------- Lower Extremity Assessment Details Patient Name: Jauregui, [name] Date of Service: 05/12/2021 8:00 AM Medical Record Number: [id number] Patient Account Number: [id number] Date of Birth/Sex: [birth year] (87 y.o. F) Treating [name]: [name] Primary Care Janel Beane: [name] Other Clinician: Referring Nabilah Davoli: [name] Treating  Crisanto Nied/Extender: [name] in Treatment: 5 Edema Assessment Assessed: [Left: No] [Right: No] [Left: Edema] [Right: :] Calf Left: Right: Point of Measurement: 31 cm From Medial Instep 30 cm Ankle Left: Right: Point of Measurement: 10 cm From Medial Instep 18 cm Knee To Floor Left: Right: From Medial Instep 38 cm Vascular Assessment Pulses: Dorsalis Pedis Palpable: [Left:Yes] Electronic Signature(s) Signed: 05/12/2021 8:31:56 AM By: [name] Entered By: [name] on 05/12/2021 08:23:42 Speir, Tarshia Carlean [name] ([id number]) -------------------------------------------------------------------------------- Multi Wound Chart Details Patient Name:  [name], [name] Date of Service: 05/12/2021 8:00 AM Medical Record Number: [id number] Patient Account Number: [id number] Date of Birth/Sex: October 20, 1928 (87 y.o. F) Treating [name]: [name] Primary Care Durrel Mcnee: [name] Other Clinician: Referring Drevin Ortner: [name] Treating Rhonda Vangieson/Extender: [name] in Treatment: 5 Vital Signs Height(in): 76 Pulse(bpm): 108 Weight(lbs): Blood Pressure(mmHg): 158/67 Body Mass Index(BMI): Temperature(F): 98.6 Respiratory Rate(breaths/min): 18 Photos: [N/A:N/A] Wound Location: Left, Lateral Malleolus Left Lower Leg N/A Wounding Event: Surgical Injury Skin Tear/Laceration N/A Primary Etiology: Dehisced Wound Skin Tear N/A Comorbid History: Chronic Obstructive Pulmonary Chronic Obstructive Pulmonary N/A Disease (COPD), Arrhythmia, Disease (COPD), Arrhythmia, Coronary Artery Disease, Coronary Artery Disease, Hypertension, Type II Diabetes, Hypertension, Type II Diabetes, Osteoarthritis, Neuropathy Osteoarthritis, Neuropathy Date Acquired: 02/08/2021 05/05/2021 N/A Weeks of Treatment: 5 1 N/A Wound Status: Open Open N/A Measurements L x W x D (cm) 0.1x0.1x0.2 2.5x1x0.1 N/A Area (cm) : 0.008 1.963 N/A Volume (cm) : 0.002 0.196 N/A % Reduction in Area:  99.20% 0.00% N/A % Reduction in Volume: 99.70% 0.00% N/A Classification: Full Thickness Without Exposed Partial Thickness N/A Support Structures Exudate Amount: Medium Small N/A Exudate Type: Serosanguineous Sanguinous N/A Exudate Color: red, brown red N/A Granulation Amount: Medium (34-66%) None Present (0%) N/A Granulation Quality: Red, Pink N/A N/A Necrotic Amount: Medium (34-66%) None Present (0%) N/A Exposed Structures: Fat Layer (Subcutaneous Tissue): Fat Layer (Subcutaneous Tissue): N/A Yes Yes Fascia: No Fascia: No Tendon: No Tendon: No Muscle: No Muscle: No Joint: No Joint: No Bone: No Bone: No Epithelialization: None None N/A Treatment Notes Electronic Signature(s) Signed: 05/12/2021 5:27:33 PM By: [name] Entered By: [name] on 05/12/2021 08:35:33 Dunnigan, Arlys Carlean [name] ([id number]) -------------------------------------------------------------------------------- [place] Details Patient Name: Tammy Blade L. Date of Service: 05/12/2021 8:00 AM Medical Record Number: [id number] Patient Account Number: [id number] Date of Birth/Sex: 1928-12-25 (87 y.o. F) Treating [name]: [name] Primary Care Merrianne Mccumbers: [name] Other Clinician: Referring Arial Galligan: [name] Treating Byanca Kasper/Extender: [name] in Treatment: 5 Active Inactive Wound/Skin Impairment Nursing Diagnoses: Impaired tissue integrity Goals: Patient/caregiver will verbalize understanding of skin care regimen Date Initiated: 04/01/2021 Date Inactivated: 04/30/2021 Target Resolution Date: 05/02/2021 Goal Status: Met Ulcer/skin breakdown will have a volume reduction of 30% by week 4 Date Initiated: 04/01/2021 Date Inactivated: 05/12/2021 Target Resolution Date: 05/02/2021 Goal Status: Met Ulcer/skin breakdown will have a volume reduction of 50% by week 8 Date Initiated: 04/01/2021 Target Resolution Date: 06/02/2021 Goal Status: Active Ulcer/skin  breakdown will have a volume reduction of 80% by week 12 Date Initiated: 04/01/2021 Target Resolution Date: 07/02/2021 Goal Status: Active Ulcer/skin breakdown will heal within 14 weeks Date Initiated: 04/01/2021 Target Resolution Date: 08/02/2021 Goal Status: Active Interventions: Assess patient/caregiver ability to obtain necessary supplies Assess patient/caregiver ability to perform ulcer/skin care regimen upon admission and as needed Assess ulceration(s) every visit Provide education on ulcer and skin care Treatment Activities: Patient referred to home care : 04/01/2021 Skin care regimen initiated : 04/01/2021 Notes: Electronic Signature(s) Signed: 05/12/2021 5:27:33 PM By: [name] Entered By: [name] on 05/12/2021 08:35:15 Azbell, Melbeta. ([id number]) -------------------------------------------------------------------------------- Pain Assessment Details Patient Name: Ficek, [name] Date of Service: 05/12/2021 8:00 AM Medical Record Number: [id number] Patient Account Number: [id number] Date of Birth/Sex: 27-Jul-1929 (87 y.o. F) Treating [name]: [name] Primary Care Mandi Mattioli: [name] Other Clinician: Referring Rayshawn Maney: [name] Treating Margurette Brener/Extender: [name] in Treatment: 5 Active Problems Location of Pain Severity and Description of Pain Patient Has Paino No Site Locations Pain Management and Medication Current Pain  Management: Electronic Signature(s) Signed: 05/12/2021 8:31:56 AM By: [name] Entered By: [name] on 05/12/2021 08:21:22 Varnum, Nathanial Rancher ([id number]) -------------------------------------------------------------------------------- Wound Assessment Details Patient Name: Bayer, [name] Date of Service: 05/12/2021 8:00 AM Medical Record Number: [id number] Patient Account Number: [id number] Date of Birth/Sex: 02/05/1929 (87 y.o. F) Treating [name]: [name] Primary Care Joe Gee:  [name] Other Clinician: Referring Armentha Branagan: [name] Treating Obie Kallenbach/Extender: [name] in Treatment: 5 Wound Status Wound Number: 1 Primary Dehisced Wound Etiology: Wound Location: Left, Lateral Malleolus Wound Open Wounding Event: Surgical Injury Status: Date Acquired: 02/08/2021 Comorbid Chronic Obstructive Pulmonary Disease (COPD), Weeks Of Treatment: 5 History: Arrhythmia, Coronary Artery Disease, Hypertension, Type Clustered Wound: No II Diabetes, Osteoarthritis, Neuropathy Photos Wound Measurements Length: (cm) Width: (cm) Depth: (cm) Area: (cm) Volume: (cm) 0 % Reduction in Area: 100% 0 % Reduction in Volume: 100% 0 Epithelialization: Large (67-100%) 0 Tunneling: No 0 Undermining: No Wound Description Classification: Full Thickness Without Exposed Support Structures Exudate Amount: None Present Foul Odor After Cleansing: No Slough/Fibrino No Wound Bed Granulation Amount: None Present (0%) Exposed Structure Necrotic Amount: None Present (0%) Fascia Exposed: No Fat Layer (Subcutaneous Tissue) Exposed: No Tendon Exposed: No Muscle Exposed: No Joint Exposed: No Bone Exposed: No Electronic Signature(s) Signed: 05/12/2021 5:27:33 PM By: [name] Previous Signature: 05/12/2021 8:31:56 AM Version By: [name] Entered By: [name] on 05/12/2021 08:41:06 Etheridge, [name] ([id number]) -------------------------------------------------------------------------------- Wound Assessment Details Patient Name: Trowbridge, [name] Date of Service: 05/12/2021 8:00 AM Medical Record Number: [id number] Patient Account Number: [id number] Date of Birth/Sex: September 12, 1929 (87 y.o. F) Treating [name]: [name] Primary Care Christia Coaxum: [name] Other Clinician: Referring Khloee Garza: [name] Treating Danniella Robben/Extender: [name] in Treatment: 5 Wound Status Wound Number: 3 Primary Skin Tear Etiology: Wound  Location: Left Lower Leg Wound Open Wounding Event: Skin Tear/Laceration Status: Date Acquired: 05/05/2021 Comorbid Chronic Obstructive Pulmonary Disease (COPD), Weeks Of Treatment: 1 History: Arrhythmia, Coronary Artery Disease, Hypertension, Type Clustered Wound: No II Diabetes, Osteoarthritis, Neuropathy Photos Wound Measurements Length: (cm) 2.5 Width: (cm) 1 Depth: (cm) 0.1 Area: (cm) 1.963 Volume: (cm) 0.196 % Reduction in Area: 0% % Reduction in Volume: 0% Epithelialization: None Tunneling: No Undermining: No Wound Description Classification: Partial Thickness Exudate Amount: Small Exudate Type: Sanguinous Exudate Color: red Foul Odor After Cleansing: No Slough/Fibrino No Wound Bed Granulation Amount: None Present (0%) Exposed Structure Necrotic Amount: None Present (0%) Fascia Exposed: No Fat Layer (Subcutaneous Tissue) Exposed: Yes Tendon Exposed: No Muscle Exposed: No Joint Exposed: No Bone Exposed: No Electronic Signature(s) Signed: 05/12/2021 8:31:56 AM By: [name] Entered By: [name] on 05/12/2021 08:22:25 Sikorski, [name] ([id number]) -------------------------------------------------------------------------------- Vitals Details Patient Name: Bonito, [name] Date of Service: 05/12/2021 8:00 AM Medical Record Number: [id number] Patient Account Number: [id number] Date of Birth/Sex: May 29, 1929 (87 y.o. F) Treating [name]: [name] Primary Care Meili Kleckley: [name] Other Clinician: Referring Modelle Vollmer: [name] Treating Autry Droege/Extender: [name] in Treatment: 5 Vital Signs Time Taken: 08:20 Temperature (F): 98.6 Height (in): 62 Pulse (bpm): 108 Respiratory Rate (breaths/min): 18 Blood Pressure (mmHg): 158/67 Reference Range: 80 - 120 mg / dl Electronic Signature(s) Signed: 05/12/2021 8:31:56 AM By: [name] Entered By: [name] on 05/12/2021 08:21:16

## 2021-05-12 NOTE — Progress Notes (Addendum)
[name] ([id number]) Visit Report for 05/12/2021 Chief Complaint Document Details Patient Name: [name], [name] Date of [name]: 05/12/2021 8:00 AM Medical Record Number: [id number] Patient Account Number: [id number] Date of Birth/Sex: 1929-06-25 (87 y.o. F) Treating RN: [name] Primary Care Provider: Miguel [name] Other Clinician: Referring Provider: Miguel [name] Treating Provider/Extender: [name] in Treatment: 5 Information Obtained from: Patient Chief Complaint Left ankle wound and left LE wound Electronic Signature(s) Signed: 05/12/2021 10:15:08 AM By: Tammy Keeler PA-C Previous Signature: 05/12/2021 8:26:59 AM Version By: Tammy Keeler PA-C Entered By: [name] on 05/12/2021 10:15:07 [name], [name] -------------------------------------------------------------------------------- HPI Details Patient Name: [name], [name] Date of [name]: 05/12/2021 8:00 AM Medical Record Number: [id number] Patient Account Number: [id number] Date of Birth/Sex: [birth year] (87 y.o. F) Treating RN: [name] Primary Care Provider: Miguel [name] Other Clinician: Referring Provider: Miguel [name] Treating Provider/Extender: [name] in Treatment: 5 History of Present Illness HPI Description: Admission 7/6 [name] is a 87 year old female with a past medical history of paroxysmal A. fib not anticoagulated and type 2 diabetes on insulin that presents to the clinic for a 1-17-monthhistory of nonhealing wound to her left ankle. She states she tripped and fell while trying to pick up some food her [name] left on her porch. She ended up cutting her left ankle. She went to that ED and had sutures placed. Unfortunately they did not stay in place and the wound dehisced. She has been using PolyMem silver to the wound. She currently denies signs of infection. 7/13; patient presents for 1 week follow-up.  She has been using Santyl to the surgical wound site and silver alginate to the abrasion. The abrasion site has healed. She has no complaints today. She followed up with vein and vascular and had ABIs done. She states that the doctor mentioned that she has enough blood flow for wound healing. She denies signs of infection today. 04/14/2021 upon evaluation today patient presents for reevaluation concerning issues that she has been having with her wound in the left lateral ankle region. Fortunately there does not appear to be any signs of infection which is great news. With that being said she is somewhat swollen and she also does have issues noted currently with necrotic tissue on the surface of the wound. I think that we need to address that today to be honest. 04/21/2021 upon evaluation today patient appears to be doing decently well in regard to her foot ulcer. She has been tolerating the dressing changes without complication. There is a minimal amount of slough overall she seems to be making excellent progress. No fevers, chills, nausea, vomiting, or diarrhea. 04/30/2021 upon evaluation today patient actually appears to be doing extremely well in regard to her wound. She has been tolerating the dressing changes without complication. Hydrofera Blue has been used up to this point although honestly I think that the patient would benefit from seeing about starting with a collagen dressing at this point. Fortunately there is no signs of active infection at this time. No fevers, chills, nausea, vomiting, or diarrhea. 05/12/2021 upon evaluation today patient's wound on the left ankle region is actually doing quite well. I am very pleased with where things stand at this time. With that being said unfortunately she did have a new skin tear last week during her wrap change which was during the wrap change and this occurred. Nonetheless this is on the left lower leg and seems to be doing much  better. This was  during a nurse visit and [name] did [place] this in order to try and reapproximate this is much as possible. He does seem to have taken to some degree but unfortunately does not seem to be doing as well as we were hoping. Either way I do think that there is definitely progress here and no signs of an infection which is also good news. In general I think that were probably can to get this healed fairly quickly which is great news. Electronic Signature(s) Signed: 05/12/2021 10:15:23 AM By: Tammy Keeler PA-C Entered By: [name] on 05/12/2021 10:15:23 [name] ([id number]) -------------------------------------------------------------------------------- Physical Exam Details Patient Name: [name], [name] Date of [name]: 05/12/2021 8:00 AM Medical Record Number: [id number] Patient Account Number: [id number] Date of Birth/Sex: 24-Apr-1929 (87 y.o. F) Treating RN: [name] Primary Care Provider: Miguel [name] Other Clinician: Referring Provider: Miguel [name] Treating Provider/Extender: [name] in Treatment: 5 Constitutional Well-nourished and well-hydrated in no acute distress. Respiratory normal breathing without difficulty. Psychiatric this patient is able to make decisions and demonstrates good insight into disease process. Alert and Oriented x 3. pleasant and cooperative. Notes Upon inspection patient's left ankle showed signs of complete epithelization which is great news and overall very pleased in that regard. I do think that the patient is making excellent progress in general. Overall I feel like that the skin tear on the left lower extremity near the knee area/calf region does seem to be doing quite well and I am very pleased in this regard. For that reason I think the only thing to do may be to switch to a Xeroform and probably not put her in a compression wrap as her swelling also seems to be doing quite well. Electronic  Signature(s) Signed: 05/12/2021 10:16:10 AM By: Tammy Keeler PA-C Entered By: [name] on 05/12/2021 10:16:09 [name] ([id number]) -------------------------------------------------------------------------------- Physician Orders Details Patient Name: [name] L. Date of [name]: 05/12/2021 8:00 AM Medical Record Number: [id number] Patient Account Number: [id number] Date of Birth/Sex: [birth year] (87 y.o. F) Treating RN: [name] Primary Care Provider: Miguel [name] Other Clinician: Referring Provider: Miguel [name] Treating Provider/Extender: [name] in Treatment: 5 Verbal / Phone Orders: No Diagnosis Coding ICD-10 Coding Code Description S91.012S Laceration without foreign body, left ankle, sequela L97.322 Non-pressure chronic ulcer of left ankle with fat layer exposed I87.2 Venous insufficiency (chronic) (peripheral) E11.621 Type 2 diabetes mellitus with foot ulcer Follow-up Appointments o Return Appointment in 1 week. Home Health o DISCONTINUE Home Health for Wound Care. Bathing/ Shower/ Hygiene o Clean wound with Normal Saline or wound cleanser. o May shower with wound dressing protected with water repellent cover or cast protector. Edema Control - Lymphedema / Segmental Compressive Device / Other o Elevate, Exercise Daily and Avoid Standing for Long Periods of Time. o Elevate legs to the level of the heart and pump ankles as often as possible o Elevate leg(s) parallel to the floor when sitting. Wound Treatment Wound #3 - Lower Leg Wound Laterality: Left Cleanser: [employer]ram Ancillary Kit - 15 Day Supply (DME) (Generic) 1 x Per Day/30 Days Discharge Instructions: Use supplies as instructed; Kit contains: (15) Saline Bullets; (15) 3x3 Gauze; 15 pr Gloves Cleanser: Normal Saline (DME) (Generic) 1 x Per Day/30 Days Discharge Instructions: Wash your hands with soap and water. Remove old dressing, discard into plastic  bag and place into trash. Cleanse the wound with Normal Saline prior to applying a clean dressing using gauze  sponges, not tissues or cotton balls. Do not scrub or use excessive force. Pat dry using gauze sponges, not tissue or cotton balls. Primary Dressing: Xeroform 4x4-HBD (in/in) (DME) (Generic) 1 x Per Day/30 Days Discharge Instructions: Apply Xeroform 4x4-HBD (in/in) as directed Secondary Dressing: ABD Pad 5x9 (in/in) (DME) (Generic) 1 x Per Day/30 Days Discharge Instructions: Cover with ABD pad Secured With: stretch net 1 x Per Day/30 Days Electronic Signature(s) Signed: 05/12/2021 5:27:33 PM By: Tammy Coria RN Signed: 05/14/2021 4:48:07 PM By: Tammy Keeler PA-C Entered By: [name] on 05/12/2021 08:44:32 [name], [name] ([id number]) -------------------------------------------------------------------------------- Problem List Details Patient Name: [name], [name] Date of [name]: 05/12/2021 8:00 AM Medical Record Number: [id number] Patient Account Number: [id number] Date of Birth/Sex: [birth year] (87 y.o. F) Treating RN: [name] Primary Care Provider: Miguel [name] Other Clinician: Referring Provider: Miguel [name] Treating Provider/Extender: [name] in Treatment: 5 Active Problems ICD-10 Encounter Code Description Active Date MDM Diagnosis S81.812A Laceration without foreign body, left lower leg, initial encounter 05/12/2021 No Yes L97.822 Non-pressure chronic ulcer of other part of left lower leg with fat layer 05/12/2021 No Yes exposed I87.2 Venous insufficiency (chronic) (peripheral) 04/14/2021 No Yes E11.621 Type 2 diabetes mellitus with foot ulcer 04/01/2021 No Yes Inactive Problems Resolved Problems ICD-10 Code Description Active Date Resolved Date S91.012S Laceration without foreign body, left ankle, sequela 04/14/2021 04/14/2021 L97.322 Non-pressure chronic ulcer of left ankle with fat layer exposed 04/01/2021 04/01/2021 Electronic  Signature(s) Signed: 05/12/2021 10:14:38 AM By: Tammy Keeler PA-C Previous Signature: 05/12/2021 8:26:53 AM Version By: Tammy Keeler PA-C Entered By: [name] on 05/12/2021 10:14:38 [name], [name] ([id number]) -------------------------------------------------------------------------------- Progress Note Details Patient Name: [name], [name] Date of [name]: 05/12/2021 8:00 AM Medical Record Number: [id number] Patient Account Number: [id number] Date of Birth/Sex: 1928/11/10 (87 y.o. F) Treating RN: [name] Primary Care Provider: Miguel [name] Other Clinician: Referring Provider: Miguel [name] Treating Provider/Extender: [name] in Treatment: 5 Subjective Chief Complaint Information obtained from Patient Left ankle wound and left LE wound History of Present Illness (HPI) Admission 7/6 Ms. Eydie Wormley is a 87 year old female with a past medical history of paroxysmal A. fib not anticoagulated and type 2 diabetes on insulin that presents to the clinic for a 1-53-monthhistory of nonhealing wound to her left ankle. She states she tripped and fell while trying to pick up some food her [name] left on her porch. She ended up cutting her left ankle. She went to that ED and had sutures placed. Unfortunately they did not stay in place and the wound dehisced. She has been using PolyMem silver to the wound. She currently denies signs of infection. 7/13; patient presents for 1 week follow-up. She has been using Santyl to the surgical wound site and silver alginate to the abrasion. The abrasion site has healed. She has no complaints today. She followed up with vein and vascular and had ABIs done. She states that the doctor mentioned that she has enough blood flow for wound healing. She denies signs of infection today. 04/14/2021 upon evaluation today patient presents for reevaluation concerning issues that she has been having with her wound in the left  lateral ankle region. Fortunately there does not appear to be any signs of infection which is great news. With that being said she is somewhat swollen and she also does have issues noted currently with necrotic tissue on the surface of the wound. I think that we need to address that today to be honest.  04/21/2021 upon evaluation today patient appears to be doing decently well in regard to her foot ulcer. She has been tolerating the dressing changes without complication. There is a minimal amount of slough overall she seems to be making excellent progress. No fevers, chills, nausea, vomiting, or diarrhea. 04/30/2021 upon evaluation today patient actually appears to be doing extremely well in regard to her wound. She has been tolerating the dressing changes without complication. Hydrofera Blue has been used up to this point although honestly I think that the patient would benefit from seeing about starting with a collagen dressing at this point. Fortunately there is no signs of active infection at this time. No fevers, chills, nausea, vomiting, or diarrhea. 05/12/2021 upon evaluation today patient's wound on the left ankle region is actually doing quite well. I am very pleased with where things stand at this time. With that being said unfortunately she did have a new skin tear last week during her wrap change which was during the wrap change and this occurred. Nonetheless this is on the left lower leg and seems to be doing much better. This was during a nurse visit and [name] did [place] this in order to try and reapproximate this is much as possible. He does seem to have taken to some degree but unfortunately does not seem to be doing as well as we were hoping. Either way I do think that there is definitely progress here and no signs of an infection which is also good news. In general I think that were probably can to get this healed fairly quickly which is great  news. Objective Constitutional Well-nourished and well-hydrated in no acute distress. Vitals Time Taken: 8:20 AM, Height: 62 in, Temperature: 98.6 F, Pulse: 108 bpm, Respiratory Rate: 18 breaths/min, Blood Pressure: 158/67 mmHg. Respiratory normal breathing without difficulty. Psychiatric this patient is able to make decisions and demonstrates good insight into disease process. Alert and Oriented x 3. pleasant and cooperative. General Notes: Upon inspection patient's left ankle showed signs of complete epithelization which is great news and overall very pleased in that regard. I do think that the patient is making excellent progress in general. Overall I feel like that the skin tear on the left lower extremity near the knee area/calf region does seem to be doing quite well and I am very pleased in this regard. For that reason I think the only thing to do may be to [name], [name] ([id number]) switch to a Xeroform and probably not put her in a compression wrap as her swelling also seems to be doing quite well. Integumentary (Hair, Skin) Wound #1 status is Open. Original cause of wound was Surgical Injury. The date acquired was: 02/08/2021. The wound has been in treatment 5 weeks. The wound is located on the Left,Lateral Malleolus. The wound measures 0cm length x 0cm width x 0cm depth; 0cm^2 area and 0cm^3 volume. There is no tunneling or undermining noted. There is a none present amount of drainage noted. There is no granulation within the wound bed. There is no necrotic tissue within the wound bed. Wound #3 status is Open. Original cause of wound was Skin Tear/Laceration. The date acquired was: 05/05/2021. The wound has been in treatment 1 weeks. The wound is located on the Left Lower Leg. The wound measures 2.5cm length x 1cm width x 0.1cm depth; 1.963cm^2 area and 0.196cm^3 volume. There is Fat Layer (Subcutaneous Tissue) exposed. There is no tunneling or undermining noted. There is a  small amount of  sanguinous drainage noted. There is no granulation within the wound bed. There is no necrotic tissue within the wound bed. Assessment Active Problems ICD-10 Laceration without foreign body, left lower leg, initial encounter Non-pressure chronic ulcer of other part of left lower leg with fat layer exposed Venous insufficiency (chronic) (peripheral) Type 2 diabetes mellitus with foot ulcer Procedures Wound #1 Pre-procedure diagnosis of Wound #1 is a Dehisced Wound located on the Left,Lateral Malleolus . There was a Three Layer Compression Therapy Procedure by Tammy Coria, RN. Post procedure Diagnosis Wound #1: Same as Pre-Procedure Wound #3 Pre-procedure diagnosis of Wound #3 is a Skin Tear located on the Left Lower Leg . There was a Three Layer Compression Therapy Procedure by Tammy Coria, RN. Post procedure Diagnosis Wound #3: Same as Pre-Procedure Plan Follow-up Appointments: Return Appointment in 1 week. Home Health: DISCONTINUE Home Health for Wound Care. Bathing/ Shower/ Hygiene: Clean wound with Normal Saline or wound cleanser. May shower with wound dressing protected with water repellent cover or cast protector. Edema Control - Lymphedema / Segmental Compressive Device / Other: Elevate, Exercise Daily and Avoid Standing for Long Periods of Time. Elevate legs to the level of the heart and pump ankles as often as possible Elevate leg(s) parallel to the floor when sitting. WOUND #3: - Lower Leg Wound Laterality: Left Cleanser: [employer] Ancillary Kit - 15 Day Supply (DME) (Generic) 1 x Per Day/30 Days Discharge Instructions: Use supplies as instructed; Kit contains: (15) Saline Bullets; (15) 3x3 Gauze; 15 pr Gloves Cleanser: Normal Saline (DME) (Generic) 1 x Per Day/30 Days Discharge Instructions: Wash your hands with soap and water. Remove old dressing, discard into plastic bag and place into trash. Cleanse the wound with Normal Saline prior to applying a clean  dressing using gauze sponges, not tissues or cotton balls. Do not scrub or use excessive force. Pat dry using gauze sponges, not tissue or cotton balls. Primary Dressing: Xeroform 4x4-HBD (in/in) (DME) (Generic) 1 x Per Day/30 Days Discharge Instructions: Apply Xeroform 4x4-HBD (in/in) as directed Secondary Dressing: ABD Pad 5x9 (in/in) (DME) (Generic) 1 x Per Day/30 Days Discharge Instructions: Cover with ABD pad Secured With: stretch net 1 x Per Day/30 Days Lamore, Ernestene L. ([id number]) 1. Would recommend currently that we going to continue with wound care measures as before and the patient is in agreement with plan. This includes the use of the Xeroform gauze dressing to the skin tear region which I think is good to be a good option as far as the lower extremity just below the knee is concerned. 2. I am also can recommend that we continue with an ABD pad instruction it is secure this in place. 3. She did have some blistering around the ankle region but I think the best thing here is probably just to try and let this dry out. That is my hope anyway. If it does open up they can put a little bit of the Xeroform as well as an ABD pad instruction over this as well. They voiced understanding. We will see patient back for reevaluation in 1 week here in the clinic. If anything worsens or changes patient will contact our office for additional recommendations. Electronic Signature(s) Signed: 05/12/2021 10:16:58 AM By: Tammy Keeler PA-C Entered By: [name] on 05/12/2021 10:16:58 [place], [name] ([id number]) -------------------------------------------------------------------------------- SuperBill Details Patient Name: [name] L. Date of [name]: 05/12/2021 Medical Record Number: [id number] Patient Account Number: [id number] Date of Birth/Sex: 09-Sep-1929 (87 y.o. F) Treating RN: [name] Primary Care  Provider: Miguel [name] Other Clinician: Referring Provider:  Miguel [name] Treating Provider/Extender: [name] in Treatment: 5 Diagnosis Coding ICD-10 Codes Code Description [phone] Laceration without foreign body, left lower leg, initial encounter L97.822 Non-pressure chronic ulcer of other part of left lower leg with fat layer exposed I87.2 Venous insufficiency (chronic) (peripheral) E11.621 Type 2 diabetes mellitus with foot ulcer Facility Procedures CPT4 Code: [id number] Description: 99213 - WOUND CARE VISIT-LEV 3 EST PT Modifier: Quantity: 1 Physician Procedures CPT4 Code: [id number] Description: 68610 - WC PHYS LEVEL 4 - EST PT Modifier: Quantity: 1 CPT4 Code: Description: ICD-10 Diagnosis Description S81.812A Laceration without foreign body, left lower leg, initial encounter L97.822 Non-pressure chronic ulcer of other part of left lower leg with fat lay I87.2 Venous insufficiency (chronic) (peripheral)  E11.621 Type 2 diabetes mellitus with foot ulcer Modifier: er exposed Quantity: Electronic Signature(s) Signed: 05/12/2021 10:17:11 AM By: Tammy Keeler PA-C Entered By: [name] on 05/12/2021 10:17:10

## 2021-05-13 DIAGNOSIS — E1165 Type 2 diabetes mellitus with hyperglycemia: Secondary | ICD-10-CM | POA: Diagnosis not present

## 2021-05-15 DIAGNOSIS — D631 Anemia in chronic kidney disease: Secondary | ICD-10-CM | POA: Diagnosis not present

## 2021-05-15 DIAGNOSIS — N1831 Chronic kidney disease, stage 3a: Secondary | ICD-10-CM | POA: Diagnosis not present

## 2021-05-15 DIAGNOSIS — Z7951 Long term (current) use of inhaled steroids: Secondary | ICD-10-CM | POA: Diagnosis not present

## 2021-05-15 DIAGNOSIS — Z7982 Long term (current) use of aspirin: Secondary | ICD-10-CM | POA: Diagnosis not present

## 2021-05-15 DIAGNOSIS — Z85828 Personal history of other malignant neoplasm of skin: Secondary | ICD-10-CM | POA: Diagnosis not present

## 2021-05-15 DIAGNOSIS — E1151 Type 2 diabetes mellitus with diabetic peripheral angiopathy without gangrene: Secondary | ICD-10-CM | POA: Diagnosis not present

## 2021-05-15 DIAGNOSIS — I480 Paroxysmal atrial fibrillation: Secondary | ICD-10-CM | POA: Diagnosis not present

## 2021-05-15 DIAGNOSIS — S91012D Laceration without foreign body, left ankle, subsequent encounter: Secondary | ICD-10-CM | POA: Diagnosis not present

## 2021-05-15 DIAGNOSIS — I2510 Atherosclerotic heart disease of native coronary artery without angina pectoris: Secondary | ICD-10-CM | POA: Diagnosis not present

## 2021-05-15 DIAGNOSIS — G4700 Insomnia, unspecified: Secondary | ICD-10-CM | POA: Diagnosis not present

## 2021-05-15 DIAGNOSIS — E1122 Type 2 diabetes mellitus with diabetic chronic kidney disease: Secondary | ICD-10-CM | POA: Diagnosis not present

## 2021-05-15 DIAGNOSIS — Z794 Long term (current) use of insulin: Secondary | ICD-10-CM | POA: Diagnosis not present

## 2021-05-15 DIAGNOSIS — R1310 Dysphagia, unspecified: Secondary | ICD-10-CM | POA: Diagnosis not present

## 2021-05-15 DIAGNOSIS — I129 Hypertensive chronic kidney disease with stage 1 through stage 4 chronic kidney disease, or unspecified chronic kidney disease: Secondary | ICD-10-CM | POA: Diagnosis not present

## 2021-05-15 DIAGNOSIS — F32A Depression, unspecified: Secondary | ICD-10-CM | POA: Diagnosis not present

## 2021-05-15 DIAGNOSIS — K219 Gastro-esophageal reflux disease without esophagitis: Secondary | ICD-10-CM | POA: Diagnosis not present

## 2021-05-15 DIAGNOSIS — E7800 Pure hypercholesterolemia, unspecified: Secondary | ICD-10-CM | POA: Diagnosis not present

## 2021-05-15 DIAGNOSIS — J449 Chronic obstructive pulmonary disease, unspecified: Secondary | ICD-10-CM | POA: Diagnosis not present

## 2021-05-15 DIAGNOSIS — M5136 Other intervertebral disc degeneration, lumbar region: Secondary | ICD-10-CM | POA: Diagnosis not present

## 2021-05-15 DIAGNOSIS — Z7902 Long term (current) use of antithrombotics/antiplatelets: Secondary | ICD-10-CM | POA: Diagnosis not present

## 2021-05-15 DIAGNOSIS — E1142 Type 2 diabetes mellitus with diabetic polyneuropathy: Secondary | ICD-10-CM | POA: Diagnosis not present

## 2021-05-18 DIAGNOSIS — Z7902 Long term (current) use of antithrombotics/antiplatelets: Secondary | ICD-10-CM | POA: Diagnosis not present

## 2021-05-18 DIAGNOSIS — Z794 Long term (current) use of insulin: Secondary | ICD-10-CM | POA: Diagnosis not present

## 2021-05-18 DIAGNOSIS — D631 Anemia in chronic kidney disease: Secondary | ICD-10-CM | POA: Diagnosis not present

## 2021-05-18 DIAGNOSIS — F32A Depression, unspecified: Secondary | ICD-10-CM | POA: Diagnosis not present

## 2021-05-18 DIAGNOSIS — E1151 Type 2 diabetes mellitus with diabetic peripheral angiopathy without gangrene: Secondary | ICD-10-CM | POA: Diagnosis not present

## 2021-05-18 DIAGNOSIS — E7800 Pure hypercholesterolemia, unspecified: Secondary | ICD-10-CM | POA: Diagnosis not present

## 2021-05-18 DIAGNOSIS — I480 Paroxysmal atrial fibrillation: Secondary | ICD-10-CM | POA: Diagnosis not present

## 2021-05-18 DIAGNOSIS — Z7951 Long term (current) use of inhaled steroids: Secondary | ICD-10-CM | POA: Diagnosis not present

## 2021-05-18 DIAGNOSIS — I129 Hypertensive chronic kidney disease with stage 1 through stage 4 chronic kidney disease, or unspecified chronic kidney disease: Secondary | ICD-10-CM | POA: Diagnosis not present

## 2021-05-18 DIAGNOSIS — S91012D Laceration without foreign body, left ankle, subsequent encounter: Secondary | ICD-10-CM | POA: Diagnosis not present

## 2021-05-18 DIAGNOSIS — E1122 Type 2 diabetes mellitus with diabetic chronic kidney disease: Secondary | ICD-10-CM | POA: Diagnosis not present

## 2021-05-18 DIAGNOSIS — E1142 Type 2 diabetes mellitus with diabetic polyneuropathy: Secondary | ICD-10-CM | POA: Diagnosis not present

## 2021-05-18 DIAGNOSIS — R1310 Dysphagia, unspecified: Secondary | ICD-10-CM | POA: Diagnosis not present

## 2021-05-18 DIAGNOSIS — I2510 Atherosclerotic heart disease of native coronary artery without angina pectoris: Secondary | ICD-10-CM | POA: Diagnosis not present

## 2021-05-18 DIAGNOSIS — M5136 Other intervertebral disc degeneration, lumbar region: Secondary | ICD-10-CM | POA: Diagnosis not present

## 2021-05-18 DIAGNOSIS — Z85828 Personal history of other malignant neoplasm of skin: Secondary | ICD-10-CM | POA: Diagnosis not present

## 2021-05-18 DIAGNOSIS — G4700 Insomnia, unspecified: Secondary | ICD-10-CM | POA: Diagnosis not present

## 2021-05-18 DIAGNOSIS — K219 Gastro-esophageal reflux disease without esophagitis: Secondary | ICD-10-CM | POA: Diagnosis not present

## 2021-05-18 DIAGNOSIS — N1831 Chronic kidney disease, stage 3a: Secondary | ICD-10-CM | POA: Diagnosis not present

## 2021-05-18 DIAGNOSIS — J449 Chronic obstructive pulmonary disease, unspecified: Secondary | ICD-10-CM | POA: Diagnosis not present

## 2021-05-18 DIAGNOSIS — Z7982 Long term (current) use of aspirin: Secondary | ICD-10-CM | POA: Diagnosis not present

## 2021-05-19 ENCOUNTER — Encounter: Payer: Medicare Other | Admitting: Physician Assistant

## 2021-05-19 ENCOUNTER — Other Ambulatory Visit: Payer: Self-pay

## 2021-05-19 DIAGNOSIS — I872 Venous insufficiency (chronic) (peripheral): Secondary | ICD-10-CM | POA: Diagnosis not present

## 2021-05-19 DIAGNOSIS — I480 Paroxysmal atrial fibrillation: Secondary | ICD-10-CM | POA: Diagnosis not present

## 2021-05-19 DIAGNOSIS — S91012D Laceration without foreign body, left ankle, subsequent encounter: Secondary | ICD-10-CM | POA: Diagnosis not present

## 2021-05-19 DIAGNOSIS — Z794 Long term (current) use of insulin: Secondary | ICD-10-CM | POA: Diagnosis not present

## 2021-05-19 DIAGNOSIS — L97822 Non-pressure chronic ulcer of other part of left lower leg with fat layer exposed: Secondary | ICD-10-CM | POA: Diagnosis not present

## 2021-05-19 DIAGNOSIS — L97322 Non-pressure chronic ulcer of left ankle with fat layer exposed: Secondary | ICD-10-CM | POA: Diagnosis not present

## 2021-05-19 DIAGNOSIS — E11622 Type 2 diabetes mellitus with other skin ulcer: Secondary | ICD-10-CM | POA: Diagnosis not present

## 2021-05-19 NOTE — Progress Notes (Addendum)
HADDI, HEMPHILL ([id number]) Visit Report for 05/19/2021 Chief Complaint Document Details Patient Name: [name], [name]. Date of Service: 05/19/2021 8:00 AM Medical Record Number: [id number] Patient Account Number: [id number] Date of Birth/Sex: 17-Dec-1928 (87 y.o. F) Treating RN: Carlene Coria Primary Care Provider: Miguel Aschoff Other Clinician: Referring Provider: Miguel Aschoff Treating Provider/Extender: Skipper Cliche in Treatment: 6 Information Obtained from: Patient Chief Complaint Left ankle wound and left LE wound Electronic Signature(s) Signed: 05/19/2021 8:19:09 AM By: Worthy Keeler PA-C Entered By: Worthy Keeler on 05/19/2021 08:19:09 Delk, Chenise L. ([id number]) -------------------------------------------------------------------------------- Debridement Details Patient Name: Hall, [name] Date of Service: 05/19/2021 8:00 AM Medical Record Number: [id number] Patient Account Number: [id number] Date of Birth/Sex: [birth year] (87 y.o. F) Treating RN: Carlene Coria Primary Care Provider: Miguel Aschoff Other Clinician: Referring Provider: Miguel Aschoff Treating Provider/Extender: Skipper Cliche in Treatment: 6 Debridement Performed for Wound #4 Left Ankle Assessment: Performed By: Physician Tommie Sams., PA-C Debridement Type: Debridement Severity of Tissue Pre Debridement: Fat layer exposed Level of Consciousness (Pre- Awake and Alert procedure): Pre-procedure Verification/Time Out Yes - 08:28 Taken: Start Time: 08:28 Total Area Debrided (L x W): 1 (cm) x 2 (cm) = 2 (cm) Tissue and other material Viable, Non-Viable, Slough, Subcutaneous, Skin: Dermis , Skin: Epidermis, Fibrin/Exudate, Slough debrided: Level: Skin/Subcutaneous Tissue Debridement Description: Excisional Instrument: Curette Bleeding: Minimum Hemostasis Achieved: Pressure End Time: 08:32 Procedural Pain: 0 Post Procedural Pain: 0 Response to Treatment:  Procedure was tolerated well Level of Consciousness (Post- Awake and Alert procedure): Post Debridement Measurements of Total Wound Length: (cm) 1 Width: (cm) 2 Depth: (cm) 0.1 Volume: (cm) 0.157 Character of Wound/Ulcer Post Debridement: Improved Severity of Tissue Post Debridement: Fat layer exposed Post Procedure Diagnosis Same as Pre-procedure Electronic Signature(s) Signed: 05/19/2021 5:51:22 PM By: Worthy Keeler PA-C Signed: 05/20/2021 9:51:49 AM By: Carlene Coria RN Entered By: Carlene Coria on 05/19/2021 08:31:18 Cove, Tristin L. ([id number]) -------------------------------------------------------------------------------- HPI Details Patient Name: [name], [name] Date of Service: 05/19/2021 8:00 AM Medical Record Number: [id number] Patient Account Number: [id number] Date of Birth/Sex: 1929-09-18 (87 y.o. F) Treating RN: Carlene Coria Primary Care Provider: Miguel Aschoff Other Clinician: Referring Provider: Miguel Aschoff Treating Provider/Extender: Skipper Cliche in Treatment: 6 History of Present Illness HPI Description: Admission 7/6 [name] is a 87 year old female with a past medical history of paroxysmal A. fib not anticoagulated and type 2 diabetes on insulin that presents to the clinic for a 1-89-monthhistory of nonhealing wound to her left ankle. She states she tripped and fell while trying to pick up some food her neighbors left on her porch. She ended up cutting her left ankle. She went to that ED and had sutures placed. Unfortunately they did not stay in place and the wound dehisced. She has been using PolyMem silver to the wound. She currently denies signs of infection. 7/13; patient presents for 1 week follow-up. She has been using Santyl to the surgical wound site and silver alginate to the abrasion. The abrasion site has healed. She has no complaints today. She followed up with vein and vascular and had ABIs done. She states that  the doctor mentioned that she has enough blood flow for wound healing. She denies signs of infection today. 04/14/2021 upon evaluation today patient presents for reevaluation concerning issues that she has been having with her wound in the left lateral ankle region. Fortunately there does not appear to be any signs of infection which is great news. With that being said  she is somewhat swollen and she also does have issues noted currently with necrotic tissue on the surface of the wound. I think that we need to address that today to be honest. 04/21/2021 upon evaluation today patient appears to be doing decently well in regard to her foot ulcer. She has been tolerating the dressing changes without complication. There is a minimal amount of slough overall she seems to be making excellent progress. No fevers, chills, nausea, vomiting, or diarrhea. 04/30/2021 upon evaluation today patient actually appears to be doing extremely well in regard to her wound. She has been tolerating the dressing changes without complication. Hydrofera Blue has been used up to this point although honestly I think that the patient would benefit from seeing about starting with a collagen dressing at this point. Fortunately there is no signs of active infection at this time. No fevers, chills, nausea, vomiting, or diarrhea. 05/12/2021 upon evaluation today patient's wound on the left ankle region is actually doing quite well. I am very pleased with where things stand at this time. With that being said unfortunately she did have a new skin tear last week during her wrap change which was during the wrap change and this occurred. Nonetheless this is on the left lower leg and seems to be doing much better. This was during a nurse visit and Morey Hummingbird did Lavon this in order to try and reapproximate this is much as possible. He does seem to have taken to some degree but unfortunately does not seem to be doing as well as we were  hoping. Either way I do think that there is definitely progress here and no signs of an infection which is also good news. In general I think that were probably can to get this healed fairly quickly which is great news. 05/19/2021 upon evaluation today patient appears to be doing a little bit worse in regard to the wounds on her legs. She actually seems to have some significant swelling and pitting edema as well and it appears that she has more breakdown and blistering even compared to last week. For that reason I really do believe that we probably need to put her back in a compression wrap unfortunately. The good news is the wound on the upper part of her leg just below the knee actually is doing better and in fact I think is probably completely healed I will still put something over this to protect it. Electronic Signature(s) Signed: 05/19/2021 5:47:07 PM By: Worthy Keeler PA-C Entered By: Worthy Keeler on 05/19/2021 17:47:07 Nikolai, Nathanial Rancher ([id number]) -------------------------------------------------------------------------------- Physical Exam Details Patient Name: Hirschfeld, Charmika L. Date of Service: 05/19/2021 8:00 AM Medical Record Number: [id number] Patient Account Number: [id number] Date of Birth/Sex: December 10, 1928 (87 y.o. F) Treating RN: Carlene Coria Primary Care Provider: Miguel Aschoff Other Clinician: Referring Provider: Miguel Aschoff Treating Provider/Extender: Skipper Cliche in Treatment: 6 Constitutional Well-nourished and well-hydrated in no acute distress. Respiratory normal breathing without difficulty. Psychiatric this patient is able to make decisions and demonstrates good insight into disease process. Alert and Oriented x 3. pleasant and cooperative. Notes Upon inspection patient's wound bed actually showed signs of good granulation epithelization at this point. Fortunately there does not appear to be any evidence of active infection which is great  and overall I am extremely pleased with where things stand today. I did have to perform some debridement around the ankle area. This is simply due to the fact that she had multiple regions that were open  and again none of them were large by any means but still I did feel the need to clear this away so we try to get it to heal I think Xeroform are probably a good option is also what we are using on the upper part of the leg so that is a good thing altogether. Electronic Signature(s) Signed: 05/19/2021 5:47:48 PM By: Worthy Keeler PA-C Entered By: Worthy Keeler on 05/19/2021 17:47:48 Kniss, Nathanial Rancher ([id number]) -------------------------------------------------------------------------------- Physician Orders Details Patient Name: [name] Date of Service: 05/19/2021 8:00 AM Medical Record Number: [id number] Patient Account Number: [id number] Date of Birth/Sex: 1929/08/17 (87 y.o. F) Treating RN: Carlene Coria Primary Care Provider: Miguel Aschoff Other Clinician: Referring Provider: Miguel Aschoff Treating Provider/Extender: Skipper Cliche in Treatment: 6 Verbal / Phone Orders: No Diagnosis Coding ICD-10 Coding Code Description [phone] Laceration without foreign body, left lower leg, initial encounter L97.822 Non-pressure chronic ulcer of other part of left lower leg with fat layer exposed I87.2 Venous insufficiency (chronic) (peripheral) E11.621 Type 2 diabetes mellitus with foot ulcer Follow-up Appointments o Return Appointment in 1 week. Home Health o DISCONTINUE Home Health for Wound Care. Bathing/ Shower/ Hygiene o Clean wound with Normal Saline or wound cleanser. o May shower with wound dressing protected with water repellent cover or cast protector. Edema Control - Lymphedema / Segmental Compressive Device / Other o Optional: One layer of unna paste to top of compression wrap (to act as an anchor). o Elevate, Exercise Daily and Avoid  Standing for Long Periods of Time. o Elevate legs to the level of the heart and pump ankles as often as possible o Elevate leg(s) parallel to the floor when sitting. Wound Treatment Wound #4 - Ankle Wound Laterality: Left Cleanser: Soap and Water Discharge Instructions: Gently cleanse wound with antibacterial soap, rinse and pat dry prior to dressing wounds Primary Dressing: Xeroform 4x4-HBD (in/in) Discharge Instructions: Apply Xeroform 4x4-HBD (in/in) as directed Secondary Dressing: ABD Pad 5x9 (in/in) Discharge Instructions: Cover with ABD pad Compression Wrap: Profore Lite LF 3 Multilayer Compression Bandaging System Discharge Instructions: Apply 3 multi-layer wrap as prescribed. Electronic Signature(s) Signed: 05/19/2021 5:51:22 PM By: Worthy Keeler PA-C Signed: 05/20/2021 9:51:49 AM By: Carlene Coria RN Entered By: Carlene Coria on 05/19/2021 08:36:32 [place], Vedika Carlean Jews ([id number]) -------------------------------------------------------------------------------- Problem List Details Patient Name: Younes, Justyne L. Date of Service: 05/19/2021 8:00 AM Medical Record Number: [id number] Patient Account Number: [id number] Date of Birth/Sex: 26-Jan-1929 (87 y.o. F) Treating RN: Carlene Coria Primary Care Provider: Miguel Aschoff Other Clinician: Referring Provider: Miguel Aschoff Treating Provider/Extender: Skipper Cliche in Treatment: 6 Active Problems ICD-10 Encounter Code Description Active Date MDM Diagnosis S81.812A Laceration without foreign body, left lower leg, initial encounter 05/12/2021 No Yes L97.822 Non-pressure chronic ulcer of other part of left lower leg with fat layer 05/12/2021 No Yes exposed I87.2 Venous insufficiency (chronic) (peripheral) 04/14/2021 No Yes E11.621 Type 2 diabetes mellitus with foot ulcer 04/01/2021 No Yes Inactive Problems Resolved Problems ICD-10 Code Description Active Date Resolved Date S91.012S Laceration without foreign  body, left ankle, sequela 04/14/2021 04/14/2021 L97.322 Non-pressure chronic ulcer of left ankle with fat layer exposed 04/01/2021 04/01/2021 Electronic Signature(s) Signed: 05/19/2021 8:19:00 AM By: Worthy Keeler PA-C Entered By: Worthy Keeler on 05/19/2021 08:19:00 Bing, Briaunna L. ([id number]) -------------------------------------------------------------------------------- Progress Note Details Patient Name: Engdahl, Weslie L. Date of Service: 05/19/2021 8:00 AM Medical Record Number: [id number] Patient Account Number: [id number] Date of Birth/Sex: [birth year] (87 y.o. F) Treating RN: Carlene Coria Primary Care Provider:  Miguel Aschoff Other Clinician: Referring Provider: Miguel Aschoff Treating Provider/Extender: Skipper Cliche in Treatment: 6 Subjective Chief Complaint Information obtained from Patient Left ankle wound and left LE wound History of Present Illness (HPI) Admission 7/6 Ms. Amneris Grivas is a 87 year old female with a past medical history of paroxysmal A. fib not anticoagulated and type 2 diabetes on insulin that presents to the clinic for a 1-49-monthhistory of nonhealing wound to her left ankle. She states she tripped and fell while trying to pick up some food her neighbors left on her porch. She ended up cutting her left ankle. She went to that ED and had sutures placed. Unfortunately they did not stay in place and the wound dehisced. She has been using PolyMem silver to the wound. She currently denies signs of infection. 7/13; patient presents for 1 week follow-up. She has been using Santyl to the surgical wound site and silver alginate to the abrasion. The abrasion site has healed. She has no complaints today. She followed up with vein and vascular and had ABIs done. She states that the doctor mentioned that she has enough blood flow for wound healing. She denies signs of infection today. 04/14/2021 upon evaluation today patient presents for  reevaluation concerning issues that she has been having with her wound in the left lateral ankle region. Fortunately there does not appear to be any signs of infection which is great news. With that being said she is somewhat swollen and she also does have issues noted currently with necrotic tissue on the surface of the wound. I think that we need to address that today to be honest. 04/21/2021 upon evaluation today patient appears to be doing decently well in regard to her foot ulcer. She has been tolerating the dressing changes without complication. There is a minimal amount of slough overall she seems to be making excellent progress. No fevers, chills, nausea, vomiting, or diarrhea. 04/30/2021 upon evaluation today patient actually appears to be doing extremely well in regard to her wound. She has been tolerating the dressing changes without complication. Hydrofera Blue has been used up to this point although honestly I think that the patient would benefit from seeing about starting with a collagen dressing at this point. Fortunately there is no signs of active infection at this time. No fevers, chills, nausea, vomiting, or diarrhea. 05/12/2021 upon evaluation today patient's wound on the left ankle region is actually doing quite well. I am very pleased with where things stand at this time. With that being said unfortunately she did have a new skin tear last week during her wrap change which was during the wrap change and this occurred. Nonetheless this is on the left lower leg and seems to be doing much better. This was during a nurse visit and CMorey Hummingbirddid SSolana Beachthis in order to try and reapproximate this is much as possible. He does seem to have taken to some degree but unfortunately does not seem to be doing as well as we were hoping. Either way I do think that there is definitely progress here and no signs of an infection which is also good news. In general I think that were probably can to  get this healed fairly quickly which is great news. 05/19/2021 upon evaluation today patient appears to be doing a little bit worse in regard to the wounds on her legs. She actually seems to have some significant swelling and pitting edema as well and it appears that she has more breakdown and  blistering even compared to last week. For that reason I really do believe that we probably need to put her back in a compression wrap unfortunately. The good news is the wound on the upper part of her leg just below the knee actually is doing better and in fact I think is probably completely healed I will still put something over this to protect it. Objective Constitutional Well-nourished and well-hydrated in no acute distress. Vitals Time Taken: 8:07 AM, Height: 62 in, Temperature: 98.1 F, Pulse: 96 bpm, Respiratory Rate: 18 breaths/min, Blood Pressure: 172/77 mmHg. Respiratory normal breathing without difficulty. Psychiatric Quinn, Mansi L. ([id number]) this patient is able to make decisions and demonstrates good insight into disease process. Alert and Oriented x 3. pleasant and cooperative. General Notes: Upon inspection patient's wound bed actually showed signs of good granulation epithelization at this point. Fortunately there does not appear to be any evidence of active infection which is great and overall I am extremely pleased with where things stand today. I did have to perform some debridement around the ankle area. This is simply due to the fact that she had multiple regions that were open and again none of them were large by any means but still I did feel the need to clear this away so we try to get it to heal I think Xeroform are probably a good option is also what we are using on the upper part of the leg so that is a good thing altogether. Integumentary (Hair, Skin) Wound #3 status is Healed - Epithelialized. Original cause of wound was Skin Tear/Laceration. The date acquired was:  05/05/2021. The wound has been in treatment 2 weeks. The wound is located on the Left Lower Leg. The wound measures 0cm length x 0cm width x 0cm depth; 0cm^2 area and 0cm^3 volume. There is no tunneling or undermining noted. There is a none present amount of drainage noted. There is no granulation within the wound bed. There is no necrotic tissue within the wound bed. Wound #4 status is Open. Original cause of wound was Gradually Appeared. The date acquired was: 05/12/2021. The wound is located on the Left Ankle. The wound measures 1cm length x 2cm width x 0.1cm depth; 1.571cm^2 area and 0.157cm^3 volume. There is Fat Layer (Subcutaneous Tissue) exposed. There is no tunneling or undermining noted. There is a small amount of purulent drainage noted. There is small (1-33%) red granulation within the wound bed. There is a large (67-100%) amount of necrotic tissue within the wound bed including Eschar. Assessment Active Problems ICD-10 Laceration without foreign body, left lower leg, initial encounter Non-pressure chronic ulcer of other part of left lower leg with fat layer exposed Venous insufficiency (chronic) (peripheral) Type 2 diabetes mellitus with foot ulcer Procedures Wound #4 Pre-procedure diagnosis of Wound #4 is a Diabetic Wound/Ulcer of the Lower Extremity located on the Left Ankle .Severity of Tissue Pre Debridement is: Fat layer exposed. There was a Excisional Skin/Subcutaneous Tissue Debridement with a total area of 2 sq cm performed by Tommie Sams., PA-C. With the following instrument(s): Curette to remove Viable and Non-Viable tissue/material. Material removed includes Subcutaneous Tissue, Slough, Skin: Dermis, Skin: Epidermis, and Fibrin/Exudate. No specimens were taken. A time out was conducted at 08:28, prior to the start of the procedure. A Minimum amount of bleeding was controlled with Pressure. The procedure was tolerated well with a pain level of 0 throughout and a pain level  of 0 following the procedure. Post Debridement Measurements: 1cm length x  2cm width x 0.1cm depth; 0.157cm^3 volume. Character of Wound/Ulcer Post Debridement is improved. Severity of Tissue Post Debridement is: Fat layer exposed. Post procedure Diagnosis Wound #4: Same as Pre-Procedure Plan Follow-up Appointments: Return Appointment in 1 week. Home Health: DISCONTINUE Home Health for Wound Care. Bathing/ Shower/ Hygiene: Clean wound with Normal Saline or wound cleanser. May shower with wound dressing protected with water repellent cover or cast protector. Edema Control - Lymphedema / Segmental Compressive Device / Other: Optional: One layer of unna paste to top of compression wrap (to act as an anchor). Elevate, Exercise Daily and Avoid Standing for Long Periods of Time. Elevate legs to the level of the heart and pump ankles as often as possible Elevate leg(s) parallel to the floor when sitting. WOUND #4: - Ankle Wound Laterality: Left Cleanser: Soap and Water Discharge Instructions: Gently cleanse wound with antibacterial soap, rinse and pat dry prior to dressing wounds Primary Dressing: Xeroform 4x4-HBD (in/in) Discharge Instructions: Apply Xeroform 4x4-HBD (in/in) as directed Dominique, Kaliyan L. ([id number]) Secondary Dressing: ABD Pad 5x9 (in/in) Discharge Instructions: Cover with ABD pad Compression Wrap: Profore Lite LF 3 Multilayer Compression Bandaging System Discharge Instructions: Apply 3 multi-layer wrap as prescribed. 1. Would recommend currently that we going continue with a Xeroform gauze dressing to both the upper leg as well as the area around the ankles where she has opening from blistering as well. 2. I am also can recommend that we continue our rather reinstate the 3 layer compression wrap for this week to try to get her edema under control hopefully this will help with getting everything to heal more appropriately and quickly. We will see patient back for  reevaluation in 1 week here in the clinic. If anything worsens or changes patient will contact our office for additional recommendations. Electronic Signature(s) Signed: 05/19/2021 5:48:14 PM By: Worthy Keeler PA-C Entered By: Worthy Keeler on 05/19/2021 17:48:14 Crego, Nathanial Rancher ([id number]) -------------------------------------------------------------------------------- SuperBill Details Patient Name: [name] Date of Service: 05/19/2021 Medical Record Number: [id number] Patient Account Number: [id number] Date of Birth/Sex: [birth year] (87 y.o. F) Treating RN: Carlene Coria Primary Care Provider: Miguel Aschoff Other Clinician: Referring Provider: Miguel Aschoff Treating Provider/Extender: Skipper Cliche in Treatment: 6 Diagnosis Coding ICD-10 Codes Code Description [phone] Laceration without foreign body, left lower leg, initial encounter L97.822 Non-pressure chronic ulcer of other part of left lower leg with fat layer exposed I87.2 Venous insufficiency (chronic) (peripheral) E11.621 Type 2 diabetes mellitus with foot ulcer Facility Procedures CPT4 Code: [id number] Description: [id number] - DEB SUBQ TISSUE 20 SQ CM/< Modifier: Quantity: 1 CPT4 Code: Description: ICD-10 Diagnosis Description L97.822 Non-pressure chronic ulcer of other part of left lower leg with fat layer Modifier: exposed Quantity: Physician Procedures CPT4 Code: [id number] Description: [id number] - WC PHYS SUBQ TISS 20 SQ CM Modifier: Quantity: 1 CPT4 Code: Description: ICD-10 Diagnosis Description L97.822 Non-pressure chronic ulcer of other part of left lower leg with fat layer Modifier: exposed Quantity: Electronic Signature(s) Signed: 05/19/2021 5:50:23 PM By: Worthy Keeler PA-C Entered By: Worthy Keeler on 05/19/2021 17:50:23

## 2021-05-20 ENCOUNTER — Encounter: Payer: Self-pay | Admitting: Family Medicine

## 2021-05-20 NOTE — Progress Notes (Signed)
[name], [name] ([id number]) Visit Report for 05/19/2021 Arrival Information Details Patient Name: [name], [name]. Date of Service: 05/19/2021 8:00 AM Medical Record Number: [id number] Patient Account Number: [id number] Date of Birth/Sex: Feb 17, 1929 (87 y.o. F) Treating RN: [name] Primary Care [name]: [name] Other Clinician: Referring [name]: [name] Treating Kaydi Kley/Extender: [name] in Treatment: 6 Visit Information History Since Last Visit All ordered tests and consults were completed: No Patient Arrived: [name] Added or deleted any medications: No Arrival Time: 07:59 Any new allergies or adverse reactions: No Accompanied By: daughter Had a fall or experienced change in No Transfer Assistance: None activities of daily living that may affect Patient Identification Verified: Yes risk of falls: Secondary Verification Process Completed: Yes Signs or symptoms of abuse/neglect since last visito No Patient Requires Transmission-Based No Hospitalized since last visit: No Precautions: Implantable device outside of the clinic excluding No Patient Has Alerts: Yes cellular tissue based products placed in the center Patient Alerts: Patient on Blood Thinner since last visit: ***PLAVIX*** Has Dressing in Place as Prescribed: Yes ALLERGIC TO Pain Present Now: No LIDOCAINE Electronic Signature(s) Signed: 05/20/2021 9:51:49 AM By: Tammy Coria RN Entered By: [name] on 05/19/2021 08:07:00 [name] -------------------------------------------------------------------------------- Clinic Level of Care Assessment Details Patient Name: Check, [name] Date of Service: 05/19/2021 8:00 AM Medical Record Number: [id number] Patient Account Number: [id number] Date of Birth/Sex: May 08, 1929 (87 y.o. F) Treating RN: [name] Primary Care Izzabella Besse: [name] Other Clinician: Referring [name]: [name] Treating Dashiel Bergquist/Extender: [name] in Treatment: 6 Clinic Level of Care Assessment Items TOOL 1 Quantity Score _0  - Use when EandM and Procedure is performed on INITIAL visit 0 ASSESSMENTS - Nursing Assessment / Reassessment _1  - General Physical Exam (combine w/ comprehensive assessment (listed just below) when performed on new 0 pt. evals) _2  - 0 Comprehensive Assessment (HX, ROS, Risk Assessments, Wounds Hx, etc.) ASSESSMENTS - Wound and Skin Assessment / Reassessment _3  - Dermatologic / Skin Assessment (not related to wound area) 0 ASSESSMENTS - Ostomy and/or Continence Assessment and Care _4  - Incontinence Assessment and Management 0 _5  - 0 Ostomy Care Assessment and Management (repouching, etc.) PROCESS - Coordination of Care _6  - Simple Patient / Family Education for ongoing care 0 _7  - 0 Complex (extensive) Patient / Family Education for ongoing care _8  - 0 Staff obtains Programmer, systems, Records, Test Results / Process Orders _9  - 0 Staff telephones HHA, Nursing Homes / Clarify orders / etc _10  - 0 Routine Transfer to another Facility (non-emergent condition) _11  - 0 Routine Hospital Admission (non-emergent condition) _12  - 0 New Admissions / Biomedical engineer / Ordering NPWT, Apligraf, etc. _13  - 0 Emergency Hospital Admission (emergent condition) PROCESS - Special Needs _14  - Pediatric / Minor Patient Management 0 _15  - 0 Isolation Patient Management _16  - 0 Hearing / Language / Visual special needs _17  - 0 Assessment of Community assistance (transportation, D/C planning, etc.) _18  - 0 Additional assistance / Altered mentation _19  - 0 Support Surface(s) Assessment (bed, cushion, seat, etc.) INTERVENTIONS - Miscellaneous _20  - External ear exam 0 _21  - 0 Patient Transfer (multiple staff / Civil Service fast streamer / Similar devices) _22  - 0 Simple Staple / Suture removal (25 or less) _23  - 0 Complex Staple / Suture removal (26 or more) _24  - 0 Hypo/Hyperglycemic  Management (do not check if billed separately) _25  - 0 Ankle / Brachial Index (ABI) - do not check if billed separately Has the patient been seen at the hospital  within the last three years: Yes Total Score: 0 Level Of Care: ____ [name] ([id number]) Electronic Signature(s) Signed: 05/20/2021 9:51:49 AM By: Tammy Coria RN Entered By: [name] on 05/19/2021 08:36:39 [name] ([id number]) -------------------------------------------------------------------------------- Encounter Discharge Information Details Patient Name: [name], [name] Date of Service: 05/19/2021 8:00 AM Medical Record Number: [id number] Patient Account Number: [id number] Date of Birth/Sex: Aug 20, 1929 (87 y.o. F) Treating RN: [name] Primary Care Matelyn Antonelli: [name] Other Clinician: Referring Bane Hagy: [name] Treating [name]/Extender: [name] in Treatment: 6 Encounter Discharge Information Items Post Procedure Vitals Discharge Condition: Stable Temperature (F): 97.1 Ambulatory Status: Walker Pulse (bpm): 96 Discharge Destination: Home Respiratory Rate (breaths/min): 18 Transportation: Private Auto Blood Pressure (mmHg): 172/77 Accompanied By: daughter Schedule Follow-up Appointment: Yes Clinical Summary of Care: Patient Declined Electronic Signature(s) Signed: 05/20/2021 9:51:49 AM By: Tammy Coria RN Entered By: [name] on 05/19/2021 08:37:54 Kittrell, Tsuyako L. ([id number]) -------------------------------------------------------------------------------- Lower Extremity Assessment Details Patient Name: Fackler, [name] Date of Service: 05/19/2021 8:00 AM Medical Record Number: [id number] Patient Account Number: [id number] Date of Birth/Sex: 04-04-1929 (87 y.o. F) Treating RN: [name] Primary Care Stuart Guillen: [name] Other Clinician: Referring Abrey Bradway: [name] Treating Roselyne Stalnaker/Extender: [name]  in Treatment: 6 Edema Assessment Assessed: [Left: No] [Right: No] Edema: [Left: Ye] [Right: s] Calf Left: Right: Point of Measurement: 3 cm From Medial Instep 30 cm Ankle Left: Right: Point of Measurement: 10 cm From Medial Instep 19 cm Knee To Floor Left: Right: From Medial Instep 38 cm Vascular Assessment Pulses: Dorsalis Pedis Palpable: [Left:Yes] Electronic Signature(s) Signed: 05/20/2021 9:51:49 AM By: Tammy Coria RN Entered By: [name] on 05/19/2021 08:18:13 Wymore, Myalynn L. ([id number]) -------------------------------------------------------------------------------- Multi Wound Chart Details Patient Name: Tapscott, [name] Date of Service: 05/19/2021 8:00 AM Medical Record Number: [id number] Patient Account Number: [id number] Date of Birth/Sex: [birth year] (87 y.o. 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 Comorbid History: Chronic Obstructive Pulmonary Chronic Obstructive Pulmonary N/A Disease (COPD), Arrhythmia, Disease (COPD), Arrhythmia, Coronary Artery Disease, Coronary Artery Disease, Hypertension, Type II Diabetes, Hypertension, Type II Diabetes, Osteoarthritis, Neuropathy Osteoarthritis, Neuropathy Date Acquired: 05/05/2021 05/12/2021 N/A Weeks of Treatment: 2 0 N/A Wound Status: Open Open N/A Measurements L x W x D (cm) 2x1.3x0.1 1x2x0.1 N/A Area (cm) : 2.042 1.571 N/A Volume (cm) : 0.204 0.157 N/A % Reduction in Area: -4.00% N/A N/A %  Reduction in Volume: -4.10% N/A N/A Classification: Partial Thickness Grade 2 N/A Exudate Amount: Small Small N/A Exudate Type: Sanguinous Purulent N/A Exudate Color: red yellow, brown, green N/A Granulation Amount: None Present (0%) Small (1-33%) N/A Granulation Quality: N/A Red N/A Necrotic Amount: None Present (0%) Large (67-100%) N/A Necrotic Tissue: N/A Eschar N/A Exposed Structures: Fat Layer (Subcutaneous Tissue): Fat Layer (Subcutaneous Tissue): N/A Yes Yes Fascia: No Fascia: No Tendon: No Tendon: No Muscle: No Muscle: No Joint: No Joint: No Bone: No Bone: No Epithelialization: Medium (34-66%) None N/A Treatment Notes Electronic Signature(s) Signed: 05/20/2021 9:51:49 AM By: Tammy Coria RN Entered By: [name] on 05/19/2021 08:27:09 Cura, Nathanial Rancher ([id number]) Froio, Nathanial Rancher ([id number]) -------------------------------------------------------------------------------- [place] Details Patient Name: [name], [name]  Date of Service: 05/19/2021 8:00 AM Medical Record Number: [id number] Patient Account Number: [id number] Date of Birth/Sex: [birth year] (87 y.o. F) Treating RN: [name] Primary Care Teriyah Purington: [name] Other Clinician: Referring Laurene Melendrez: [name] Treating Casidee Jann/Extender: [name] in Treatment: 6 Active Inactive Wound/Skin Impairment Nursing Diagnoses: Impaired tissue integrity Goals: Patient/caregiver will verbalize understanding of skin care regimen Date Initiated: 04/01/2021 Date Inactivated: 04/30/2021 Target Resolution Date: 05/02/2021 Goal Status: Met Ulcer/skin breakdown will have a volume reduction of 30% by week 4 Date Initiated: 04/01/2021 Date Inactivated: 05/12/2021 Target Resolution Date: 05/02/2021 Goal Status: Met Ulcer/skin breakdown will have a volume reduction of 50% by week 8 Date Initiated: 04/01/2021 Target Resolution Date: 06/02/2021 Goal Status:  Active Ulcer/skin breakdown will have a volume reduction of 80% by week 12 Date Initiated: 04/01/2021 Target Resolution Date: 07/02/2021 Goal Status: Active Ulcer/skin breakdown will heal within 14 weeks Date Initiated: 04/01/2021 Target Resolution Date: 08/02/2021 Goal Status: Active Interventions: Assess patient/caregiver ability to obtain necessary supplies Assess patient/caregiver ability to perform ulcer/skin care regimen upon admission and as needed Assess ulceration(s) every visit Provide education on ulcer and skin care Treatment Activities: Patient referred to home care : 04/01/2021 Skin care regimen initiated : 04/01/2021 Notes: Electronic Signature(s) Signed: 05/20/2021 9:51:49 AM By: Tammy Coria RN Entered By: [name] on 05/19/2021 08:26:59 Cacho, [place]. ([id number]) -------------------------------------------------------------------------------- Pain Assessment Details Patient Name: Figgs, Tyianna L. Date of Service: 05/19/2021 8:00 AM Medical Record Number: [id number] Patient Account Number: [id number] Date of Birth/Sex: [birth year] (87 y.o. F) Treating RN: [name] Primary Care Breaker Springer: [name] Other Clinician: Referring Ervan Heber: [name] Treating Monnica Saltsman/Extender: [name] in Treatment: 6 Active Problems Location of Pain Severity and Description of Pain Patient Has Paino No Site Locations Pain Management and Medication Current Pain Management: Electronic Signature(s) Signed: 05/20/2021 9:51:49 AM By: Tammy Coria RN Entered By: [name] on 05/19/2021 08:07:24 Cirilo, Nathanial Rancher ([id number]) -------------------------------------------------------------------------------- Patient/Caregiver Education Details Patient Name: [name], [name] Date of Service: 05/19/2021 8:00 AM Medical Record Number: [id number] Patient Account Number: [id number] Date of Birth/Gender: 1929-04-06 (87 y.o. F) Treating RN: [name] Primary Care Physician: [name] Other Clinician: Referring Physician: Miguel Hall Treating Physician/Extender: [name] in Treatment: 6 Education Assessment Education Provided To: Patient Education Topics Provided Wound/Skin Impairment: Methods: Explain/Verbal Responses: State content correctly Electronic Signature(s) Signed: 05/20/2021 9:51:49 AM By: Tammy Coria RN Entered By: [name] on 05/19/2021 08:27:54 Ballon, [name] ([id number]) -------------------------------------------------------------------------------- Wound Assessment Details Patient Name: Guile, [name] Date of Service: 05/19/2021 8:00 AM Medical Record Number: [id number] Patient Account Number: [id number] Date of Birth/Sex: 10/15/1928 (87 y.o. F) Treating RN: [name] Primary Care Krystalle Pilkington: [name] Other Clinician: Referring Badr Piedra: [name] Treating Darren Nodal/Extender: [name] in Treatment: 6 Wound Status Wound Number: 3 Primary Skin Tear Etiology: Wound Location: Left Lower Leg Wound Healed - Epithelialized Wounding Event: Skin Tear/Laceration Status: Date Acquired: 05/05/2021 Comorbid Chronic Obstructive Pulmonary Disease (COPD), Weeks Of Treatment: 2 History: Arrhythmia, Coronary Artery Disease, Hypertension, Type Clustered Wound: No II Diabetes, Osteoarthritis, Neuropathy Photos Wound Measurements Length: (cm) 0 % Redu Width: (cm) 0 % Redu Depth: (cm) 0 Epithe Area: (cm) 0 Tunne Volume: (cm) 0 Under ction in Area: 100% ction in Volume: 100% lialization: Large (67-100%) ling: No mining: No Wound Description Classification: Partial Thickness Foul O Exudate Amount: None Present Slough dor After Cleansing: No /Fibrino No Wound Bed Granulation Amount: None Present (0%) Exposed Structure Necrotic Amount: None Present (0%) Fascia Exposed: No Fat Layer (  Subcutaneous Tissue) Exposed: No Tendon Exposed: No Muscle  Exposed: No Joint Exposed: No Bone Exposed: No Treatment Notes Wound #3 (Lower Leg) Wound Laterality: Left Cleanser Peri-Wound Care Topical Primary Dressing KATHEEN, ASLIN ([id number]) Secondary Dressing Secured With Compression Wrap Compression Stockings Add-Ons Electronic Signature(s) Signed: 05/20/2021 9:51:49 AM By: Tammy Coria RN Entered By: [name] on 05/19/2021 08:33:25 Selleck, [name] ([id number]) -------------------------------------------------------------------------------- Wound Assessment Details Patient Name: Joubert, [name] Date of Service: 05/19/2021 8:00 AM Medical Record Number: [id number] Patient Account Number: [id number] Date of Birth/Sex: 25-Sep-1929 (87 y.o. F) Treating RN: [name] Primary Care Willliam Pettet: [name] Other Clinician: Referring Montavious Wierzba: [name] Treating Aicha Clingenpeel/Extender: [name] in Treatment: 6 Wound Status Wound Number: 4 Primary Diabetic Wound/Ulcer of the Lower Extremity Etiology: Wound Location: Left Ankle Wound Open Wounding Event: Gradually Appeared Status: Date Acquired: 05/12/2021 Comorbid Chronic Obstructive Pulmonary Disease (COPD), Weeks Of Treatment: 0 History: Arrhythmia, Coronary Artery Disease, Hypertension, Type Clustered Wound: No II Diabetes, Osteoarthritis, Neuropathy Photos Wound Measurements Length: (cm) 1 Width: (cm) 2 Depth: (cm) 0.1 Area: (cm) 1.571 Volume: (cm) 0.157 % Reduction in Area: % Reduction in Volume: Epithelialization: None Tunneling: No Undermining: No Wound Description Classification: Grade 2 Exudate Amount: Small Exudate Type: Purulent Exudate Color: yellow, brown, green Foul Odor After Cleansing: No Slough/Fibrino Yes Wound Bed Granulation Amount: Small (1-33%) Exposed Structure Granulation Quality: Red Fascia Exposed: No Necrotic Amount: Large (67-100%) Fat Layer (Subcutaneous Tissue) Exposed: Yes Necrotic Quality:  Eschar Tendon Exposed: No Muscle Exposed: No Joint Exposed: No Bone Exposed: No Treatment Notes Wound #4 (Ankle) Wound Laterality: Left Cleanser Soap and Water Discharge Instruction: Gently cleanse wound with antibacterial soap, rinse and pat dry prior to dressing wounds Peri-Wound Care Shelvin, [name] ([id number]) Topical Primary Dressing Xeroform 4x4-HBD (in/in) Discharge Instruction: Apply Xeroform 4x4-HBD (in/in) as directed Secondary Dressing ABD Pad 5x9 (in/in) Discharge Instruction: Cover with ABD pad Secured With Compression Wrap Profore Lite LF 3 Multilayer Compression Bandaging System Discharge Instruction: Apply 3 multi-layer wrap as prescribed. Compression Stockings Add-Ons Electronic Signature(s) Signed: 05/20/2021 9:51:49 AM By: Tammy Coria RN Entered By: [name] on 05/19/2021 08:17:14 Moncada, [name] ([id number]) -------------------------------------------------------------------------------- [place] Details Patient Name: [name], [name] Date of Service: 05/19/2021 8:00 AM Medical Record Number: [id number] Patient Account Number: [id number] Date of Birth/Sex: 11/19/1928 (87 y.o. F) Treating RN: [name] Primary Care Jamyla Ard: [name] Other Clinician: Referring Manette Doto: [name] Treating Orvel Cutsforth/Extender: [name] in Treatment: 6 Vital Signs Time Taken: 08:07 Temperature (F): 98.1 Height (in): 62 Pulse (bpm): 96 Respiratory Rate (breaths/min): 18 Blood Pressure (mmHg): 172/77 Reference Range: 80 - 120 mg / dl Electronic Signature(s) Signed: 05/20/2021 9:51:49 AM By: Tammy Coria RN Entered By: [name] on 05/19/2021 08:07:18

## 2021-05-21 ENCOUNTER — Telehealth: Payer: Self-pay

## 2021-05-21 NOTE — Telephone Encounter (Signed)
Copied from [place] [id number]. Topic: General - Inquiry >> May 21, 2021  9:36 AM Loma [place] wrote: Tilda Burrow, from [facility], orders from 8/13 and not seem to be uploaded , refaxing pls confirming the fax was  received  [phone] personal cell just leave message # order [phone]

## 2021-05-21 NOTE — Telephone Encounter (Signed)
In your stack to sign. Please review. Thanks!

## 2021-05-25 ENCOUNTER — Ambulatory Visit: Payer: Medicare Other | Admitting: Dermatology

## 2021-05-25 ENCOUNTER — Other Ambulatory Visit: Payer: Self-pay

## 2021-05-25 DIAGNOSIS — R21 Rash and other nonspecific skin eruption: Secondary | ICD-10-CM

## 2021-05-25 DIAGNOSIS — L299 Pruritus, unspecified: Secondary | ICD-10-CM

## 2021-05-25 DIAGNOSIS — R234 Changes in skin texture: Secondary | ICD-10-CM | POA: Diagnosis not present

## 2021-05-25 MED ORDER — EUCRISA 2 % EX OINT: 1.0000 "application " | TOPICAL_OINTMENT | Freq: Two times a day (BID) | CUTANEOUS | 2 refills | Status: DC

## 2021-05-25 NOTE — Patient Instructions (Signed)

## 2021-05-25 NOTE — Progress Notes (Signed)
   Follow-Up Visit   Subjective  [name] is a 87 y.o. female who presents for the following: Rash (Breaking out and itching all over x 3-4 weeks. Started after she had dye for a procedure in the hospital.).  Accompanied by daughter who contributes to history.  The following portions of the chart were reviewed this encounter and updated as appropriate:   Tobacco  Allergies  Meds  Problems  Med Hx  Surg Hx  Fam Hx     Review of Systems:  No other skin or systemic complaints except as noted in HPI or Assessment and Plan.  Objective  Well appearing patient in no apparent distress; mood and affect are within normal limits.  A focused examination was performed including trunk, extremities. Relevant physical exam findings are noted in the Assessment and Plan.  Scattered small crusts of trunk and extremities   Assessment & Plan  Rash - with pruritus and crusts and excoriations Most consistent with reaction to procedure contrast dye during hospitalization.  Chronic and persistent for several weeks. She has an allergy to triamcinolone cream. Start Eucrisa oint bid   Crisaborole (EUCRISA) 2 % OINT Apply 1 application topically 2 (two) times daily.  Return in about 2 months (around 07/25/2021) for Follow up, TBSE.  I, Ashok Cordia, CMA, am acting as scribe for Sarina Ser, MD . Documentation: I have reviewed the above documentation for accuracy and completeness, and I agree with the above.  Sarina Ser, MD

## 2021-05-26 ENCOUNTER — Encounter: Payer: Self-pay | Admitting: Dermatology

## 2021-05-26 ENCOUNTER — Encounter: Payer: Medicare Other | Admitting: Physician Assistant

## 2021-05-26 DIAGNOSIS — Z794 Long term (current) use of insulin: Secondary | ICD-10-CM | POA: Diagnosis not present

## 2021-05-26 DIAGNOSIS — I872 Venous insufficiency (chronic) (peripheral): Secondary | ICD-10-CM | POA: Diagnosis not present

## 2021-05-26 DIAGNOSIS — S91012D Laceration without foreign body, left ankle, subsequent encounter: Secondary | ICD-10-CM | POA: Diagnosis not present

## 2021-05-26 DIAGNOSIS — I480 Paroxysmal atrial fibrillation: Secondary | ICD-10-CM | POA: Diagnosis not present

## 2021-05-26 DIAGNOSIS — L97822 Non-pressure chronic ulcer of other part of left lower leg with fat layer exposed: Secondary | ICD-10-CM | POA: Diagnosis not present

## 2021-05-26 DIAGNOSIS — L97322 Non-pressure chronic ulcer of left ankle with fat layer exposed: Secondary | ICD-10-CM | POA: Diagnosis not present

## 2021-05-26 DIAGNOSIS — E11622 Type 2 diabetes mellitus with other skin ulcer: Secondary | ICD-10-CM | POA: Diagnosis not present

## 2021-05-26 DIAGNOSIS — L97329 Non-pressure chronic ulcer of left ankle with unspecified severity: Secondary | ICD-10-CM | POA: Diagnosis not present

## 2021-05-26 NOTE — Progress Notes (Addendum)
REYNAH, OESTREICH ([id number]) Visit Report for 05/26/2021 Chief Complaint Document Details Patient Name: [name], [name]. Date of Service: 05/26/2021 8:00 AM Medical Record Number: [id number] Patient Account Number: [id number] Date of Birth/Sex: 10-27-1928 (87 y.o. F) Treating RN: Carlene Coria Primary Care Provider: Miguel Aschoff Other Clinician: Referring Provider: Miguel Aschoff Treating Provider/Extender: Skipper Cliche in Treatment: 7 Information Obtained from: Patient Chief Complaint Left ankle wound and left LE wound Electronic Signature(s) Signed: 05/26/2021 8:20:41 AM By: Worthy Keeler PA-C Entered By: Worthy Keeler on 05/26/2021 08:20:41 [name], [name] Kitchen ([id number]) -------------------------------------------------------------------------------- HPI Details Patient Name: [name], [name] Date of Service: 05/26/2021 8:00 AM Medical Record Number: [id number] Patient Account Number: [id number] Date of Birth/Sex: 29-Nov-1928 (87 y.o. F) Treating RN: Carlene Coria Primary Care Provider: Miguel Aschoff Other Clinician: Referring Provider: Miguel Aschoff Treating Provider/Extender: Skipper Cliche in Treatment: 7 History of Present Illness HPI Description: Admission 7/6 [name] is a 87 year old female with a past medical history of paroxysmal A. fib not anticoagulated and type 2 diabetes on insulin that presents to the clinic for a 1-39-monthhistory of nonhealing wound to her left ankle. She states she tripped and fell while trying to pick up some food her [name] left on her porch. She ended up cutting her left ankle. She went to that ED and had sutures placed. Unfortunately they did not stay in place and the wound dehisced. She has been using PolyMem silver to the wound. She currently denies signs of infection. 7/13; patient presents for 1 week follow-up. She has been using Santyl to the surgical wound site and silver alginate to  the abrasion. The abrasion site has healed. She has no complaints today. She followed up with vein and vascular and had ABIs done. She states that the doctor mentioned that she has enough blood flow for wound healing. She denies signs of infection today. 04/14/2021 upon evaluation today patient presents for reevaluation concerning issues that she has been having with her wound in the left lateral ankle region. Fortunately there does not appear to be any signs of infection which is great news. With that being said she is somewhat swollen and she also does have issues noted currently with necrotic tissue on the surface of the wound. I think that we need to address that today to be honest. 04/21/2021 upon evaluation today patient appears to be doing decently well in regard to her foot ulcer. She has been tolerating the dressing changes without complication. There is a minimal amount of slough overall she seems to be making excellent progress. No fevers, chills, nausea, vomiting, or diarrhea. 04/30/2021 upon evaluation today patient actually appears to be doing extremely well in regard to her wound. She has been tolerating the dressing changes without complication. Hydrofera Blue has been used up to this point although honestly I think that the patient would benefit from seeing about starting with a collagen dressing at this point. Fortunately there is no signs of active infection at this time. No fevers, chills, nausea, vomiting, or diarrhea. 05/12/2021 upon evaluation today patient's wound on the left ankle region is actually doing quite well. I am very pleased with where things stand at this time. With that being said unfortunately she did have a new skin tear last week during her wrap change which was during the wrap change and this occurred. Nonetheless this is on the left lower leg and seems to be doing much better. This was during a nurse visit and [employer] this in order to try and  reapproximate this is much as possible. He does seem to have taken to some degree but unfortunately does not seem to be doing as well as we were hoping. Either way I do think that there is definitely progress here and no signs of an infection which is also good news. In general I think that were probably can to get this healed fairly quickly which is great news. 05/19/2021 upon evaluation today patient appears to be doing a little bit worse in regard to the wounds on her legs. She actually seems to have some significant swelling and pitting edema as well and it appears that she has more breakdown and blistering even compared to last week. For that reason I really do believe that we probably need to put her back in a compression wrap unfortunately. The good news is the wound on the upper part of her leg just below the knee actually is doing better and in fact I think is probably completely healed I will still put something over this to protect it. 05/26/2021 upon evaluation today patient appears to be completely healed which is great news. She does not have compression socks whether but we can use Tubigrip to go ahead and get things started which I think will be appropriate. Fortunately there is no sign of active infection at this time. No fevers, chills, nausea, vomiting, or diarrhea. Electronic Signature(s) Signed: 05/26/2021 9:19:13 AM By: Worthy Keeler PA-C Entered By: Worthy Keeler on 05/26/2021 09:19:13 Raetz, [name] ([id number]) -------------------------------------------------------------------------------- Physical Exam Details Patient Name: [name], [name] Date of Service: 05/26/2021 8:00 AM Medical Record Number: [id number] Patient Account Number: [id number] Date of Birth/Sex: [birth year] (87 y.o. F) Treating RN: Carlene Coria Primary Care Provider: Miguel Aschoff Other Clinician: Referring Provider: Miguel Aschoff Treating Provider/Extender: Skipper Cliche in  Treatment: 7 Constitutional Well-nourished and well-hydrated in no acute distress. Respiratory normal breathing without difficulty. Psychiatric this patient is able to make decisions and demonstrates good insight into disease process. Alert and Oriented x 3. pleasant and cooperative. Notes Upon inspection patient's wound bed showed signs of good epithelization at this point. Fortunately there does not appear to be any evidence of active infection which is great news and overall I am extremely pleased. I did feel that she was ready for discharge based on what I see today. Electronic Signature(s) Signed: 05/26/2021 9:19:38 AM By: Worthy Keeler PA-C Entered By: Worthy Keeler on 05/26/2021 09:19:37 Dugue, [name] ([id number]) -------------------------------------------------------------------------------- Physician Orders Details Patient Name: [name], [name] Date of Service: 05/26/2021 8:00 AM Medical Record Number: [id number] Patient Account Number: [id number] Date of Birth/Sex: [birth year] (87 y.o. F) Treating RN: Carlene Coria Primary Care Provider: Miguel Aschoff Other Clinician: Referring Provider: Miguel Aschoff Treating Provider/Extender: Skipper Cliche in Treatment: 7 Verbal / Phone Orders: No Diagnosis Coding ICD-10 Coding Code Description [phone] Laceration without foreign body, left lower leg, initial encounter L97.822 Non-pressure chronic ulcer of other part of left lower leg with fat layer exposed I87.2 Venous insufficiency (chronic) (peripheral) E11.621 Type 2 diabetes mellitus with foot ulcer Discharge From [facility] o Discharge from [facility] Complete - patient to wear tubi grip size D double layer until compression socks arrive. on in the am and off in the pm , apply lotion in the evening Electronic Signature(s) Signed: 05/26/2021 5:58:25 PM By: Worthy Keeler PA-C Signed: 05/27/2021 7:55:31 AM By: Carlene Coria RN Entered  By: Carlene Coria on 05/26/2021 08:27:18  [name], [name] ([id number]) -------------------------------------------------------------------------------- Problem List Details Patient Name: Wenig, Pamelyn L. Date of Service: 05/26/2021 8:00 AM Medical Record Number: [id number] Patient Account Number: [id number] Date of Birth/Sex: December 01, 1928 (87 y.o. F) Treating RN: Carlene Coria Primary Care Provider: Miguel Aschoff Other Clinician: Referring Provider: Miguel Aschoff Treating Provider/Extender: Skipper Cliche in Treatment: 7 Active Problems ICD-10 Encounter Code Description Active Date MDM Diagnosis S81.812A Laceration without foreign body, left lower leg, initial encounter 05/12/2021 No Yes L97.822 Non-pressure chronic ulcer of other part of left lower leg with fat layer 05/12/2021 No Yes exposed I87.2 Venous insufficiency (chronic) (peripheral) 04/14/2021 No Yes E11.621 Type 2 diabetes mellitus with foot ulcer 04/01/2021 No Yes Inactive Problems Resolved Problems ICD-10 Code Description Active Date Resolved Date S91.012S Laceration without foreign body, left ankle, sequela 04/14/2021 04/14/2021 L97.322 Non-pressure chronic ulcer of left ankle with fat layer exposed 04/01/2021 04/01/2021 Electronic Signature(s) Signed: 05/26/2021 8:20:35 AM By: Worthy Keeler PA-C Entered By: Worthy Keeler on 05/26/2021 08:20:35 Deyarmin, Chaitra L. ([id number]) -------------------------------------------------------------------------------- Progress Note Details Patient Name: [name], [name] Date of Service: 05/26/2021 8:00 AM Medical Record Number: [id number] Patient Account Number: [id number] Date of Birth/Sex: 01-Jan-1929 (87 y.o. F) Treating RN: Carlene Coria Primary Care Provider: Miguel Aschoff Other Clinician: Referring Provider: Miguel Aschoff Treating Provider/Extender: Skipper Cliche in Treatment: 7 Subjective Chief Complaint Information obtained from Patient Left  ankle wound and left LE wound History of Present Illness (HPI) Admission 7/6 Ms. Brigetta Hollenbach is a 87 year old female with a past medical history of paroxysmal A. fib not anticoagulated and type 2 diabetes on insulin that presents to the clinic for a 1-88-monthhistory of nonhealing wound to her left ankle. She states she tripped and fell while trying to pick up some food her [name] left on her porch. She ended up cutting her left ankle. She went to that ED and had sutures placed. Unfortunately they did not stay in place and the wound dehisced. She has been using PolyMem silver to the wound. She currently denies signs of infection. 7/13; patient presents for 1 week follow-up. She has been using Santyl to the surgical wound site and silver alginate to the abrasion. The abrasion site has healed. She has no complaints today. She followed up with vein and vascular and had ABIs done. She states that the doctor mentioned that she has enough blood flow for wound healing. She denies signs of infection today. 04/14/2021 upon evaluation today patient presents for reevaluation concerning issues that she has been having with her wound in the left lateral ankle region. Fortunately there does not appear to be any signs of infection which is great news. With that being said she is somewhat swollen and she also does have issues noted currently with necrotic tissue on the surface of the wound. I think that we need to address that today to be honest. 04/21/2021 upon evaluation today patient appears to be doing decently well in regard to her foot ulcer. She has been tolerating the dressing changes without complication. There is a minimal amount of slough overall she seems to be making excellent progress. No fevers, chills, nausea, vomiting, or diarrhea. 04/30/2021 upon evaluation today patient actually appears to be doing extremely well in regard to her wound. She has been tolerating the dressing changes without  complication. Hydrofera Blue has been used up to this point although honestly I think that the patient would benefit from seeing about starting with a collagen dressing at this point. Fortunately there is no signs  of active infection at this time. No fevers, chills, nausea, vomiting, or diarrhea. 05/12/2021 upon evaluation today patient's wound on the left ankle region is actually doing quite well. I am very pleased with where things stand at this time. With that being said unfortunately she did have a new skin tear last week during her wrap change which was during the wrap change and this occurred. Nonetheless this is on the left lower leg and seems to be doing much better. This was during a nurse visit and [employer]y [employer] did [place] this in order to try and reapproximate this is much as possible. He does seem to have taken to some degree but unfortunately does not seem to be doing as well as we were hoping. Either way I do think that there is definitely progress here and no signs of an infection which is also good news. In general I think that were probably can to get this healed fairly quickly which is great news. 05/19/2021 upon evaluation today patient appears to be doing a little bit worse in regard to the wounds on her legs. She actually seems to have some significant swelling and pitting edema as well and it appears that she has more breakdown and blistering even compared to last week. For that reason I really do believe that we probably need to put her back in a compression wrap unfortunately. The good news is the wound on the upper part of her leg just below the knee actually is doing better and in fact I think is probably completely healed I will still put something over this to protect it. 05/26/2021 upon evaluation today patient appears to be completely healed which is great news. She does not have compression socks whether but we can use Tubigrip to go ahead and get things started which I  think will be appropriate. Fortunately there is no sign of active infection at this time. No fevers, chills, nausea, vomiting, or diarrhea. Objective Constitutional Well-nourished and well-hydrated in no acute distress. Vitals Time Taken: 8:11 AM, Height: 62 in, Temperature: 98.1 F, Pulse: 93 bpm, Respiratory Rate: 18 breaths/min, Blood Pressure: 188/78 mmHg. [name], [name] ([id number]) Respiratory normal breathing without difficulty. Psychiatric this patient is able to make decisions and demonstrates good insight into disease process. Alert and Oriented x 3. pleasant and cooperative. General Notes: Upon inspection patient's wound bed showed signs of good epithelization at this point. Fortunately there does not appear to be any evidence of active infection which is great news and overall I am extremely pleased. I did feel that she was ready for discharge based on what I see today. Integumentary (Hair, Skin) Wound #4 status is Open. Original cause of wound was Gradually Appeared. The date acquired was: 05/12/2021. The wound has been in treatment 1 weeks. The wound is located on the Left Ankle. The wound measures 0cm length x 0cm width x 0cm depth; 0cm^2 area and 0cm^3 volume. There is no tunneling or undermining noted. There is a none present amount of drainage noted. There is no granulation within the wound bed. There is no necrotic tissue within the wound bed. Assessment Active Problems ICD-10 Laceration without foreign body, left lower leg, initial encounter Non-pressure chronic ulcer of other part of left lower leg with fat layer exposed Venous insufficiency (chronic) (peripheral) Type 2 diabetes mellitus with foot ulcer Plan Discharge From [facility] Services: Discharge from [facility] Complete - patient to wear tubi grip size D double layer until compression socks arrive. on  in the am and off in the pm , apply lotion in the evening 1. I do believe that the  patient is going to need to go ahead and start with the compression at this point. I think compression socks will be ideal we gave her information for elastic therapy today. 2. With regard to the treatment plan currently the patient does seem to be doing well and for the time being we will go ahead and apply Tubigrip to help with edema control. I think she can shower normally just putting the Tubigrip back on afterwards. 3. I am also can recommend that the patient continue to monitor for any signs of worsening from the standpoint of infection. We will see patient back for reevaluation in 1 week here in the clinic. If anything worsens or changes patient will contact our office for additional recommendations. Electronic Signature(s) Signed: 05/26/2021 9:31:54 AM By: Worthy Keeler PA-C Entered By: Worthy Keeler on 05/26/2021 09:31:54 [name], [name] ([id number]) -------------------------------------------------------------------------------- SuperBill Details Patient Name: [name], [name] Date of Service: 05/26/2021 Medical Record Number: [id number] Patient Account Number: [id number] Date of Birth/Sex: April 08, 1929 (87 y.o. F) Treating RN: Carlene Coria Primary Care Provider: Miguel Aschoff Other Clinician: Referring Provider: Miguel Aschoff Treating Provider/Extender: Skipper Cliche in Treatment: 7 Diagnosis Coding ICD-10 Codes Code Description [phone] Laceration without foreign body, left lower leg, initial encounter L97.822 Non-pressure chronic ulcer of other part of left lower leg with fat layer exposed I87.2 Venous insufficiency (chronic) (peripheral) E11.621 Type 2 diabetes mellitus with foot ulcer Facility Procedures CPT4 Code: [id number] Description: [phone] - WOUND CARE VISIT-LEV 2 EST PT Modifier: Quantity: 1 Physician Procedures CPT4 Code: [id number] Description: [id number] - WC PHYS LEVEL 3 - EST PT Modifier: Quantity: 1 CPT4 Code: Description: ICD-10 Diagnosis  Description S81.812A Laceration without foreign body, left lower leg, initial encounter L97.822 Non-pressure chronic ulcer of other part of left lower leg with fat lay I87.2 Venous insufficiency (chronic) (peripheral)  E11.621 Type 2 diabetes mellitus with foot ulcer Modifier: er exposed Quantity: Electronic Signature(s) Signed: 05/26/2021 9:32:29 AM By: Worthy Keeler PA-C Entered By: Worthy Keeler on 05/26/2021 09:32:29

## 2021-05-27 ENCOUNTER — Other Ambulatory Visit: Payer: Self-pay | Admitting: Family Medicine

## 2021-05-27 NOTE — Progress Notes (Signed)
EYVA, HERON ([id number]) Visit Report for 05/26/2021 Arrival Information Details Patient Name: [name], [name]. Date of Service: 05/26/2021 8:00 AM Medical Record Number: [id number] Patient Account Number: [id number] Date of Birth/Sex: 09/07/1929 (87 y.o. F) Treating RN: [name] Primary Care [name]: [name] Other Clinician: Referring [name]: [name] Treating Aide [name] in Treatment: 7 Visit Information History Since Last Visit All ordered tests and consults were completed: No Patient Arrived: [name] Added or deleted any medications: No Arrival Time: 08:11 Any new allergies or adverse reactions: No Accompanied By: daughter Had a fall or experienced change in No Transfer Assistance: None activities of daily living that may affect Patient Identification Verified: Yes risk of falls: Secondary Verification Process Completed: Yes Signs or symptoms of abuse/neglect since last visito No Patient Requires Transmission-Based No Hospitalized since last visit: No Precautions: Implantable device outside of the clinic excluding No Patient Has Alerts: Yes cellular tissue based products placed in the center Patient Alerts: Patient on Blood Thinner since last visit: ***PLAVIX*** Has Dressing in Place as Prescribed: Yes ALLERGIC TO Has Compression in Place as Prescribed: Yes LIDOCAINE Pain Present Now: No Electronic Signature(s) Signed: 05/27/2021 7:55:31 AM By: Tammy Coria RN Entered By: [name] on 05/26/2021 08:11:46 Vandenbosch, [name]. ([id number]) -------------------------------------------------------------------------------- Clinic Level of Care Assessment Details Patient Name: Kueker, [name] Date of Service: 05/26/2021 8:00 AM Medical Record Number: [id number] Patient Account Number: [id number] Date of Birth/Sex: [birth year] (87 y.o. F) Treating RN: [name] Primary Care Cyrah Mclamb: [name]  Other Clinician: Referring [name]: [name] Treating [name]/Extender: [name] in Treatment: 7 Clinic Level of Care Assessment Items TOOL 4 Quantity Score X - Use when only an EandM is performed on FOLLOW-UP visit 1 0 ASSESSMENTS - Nursing Assessment / Reassessment X - Reassessment of Co-morbidities (includes updates in patient status) 1 10 X- 1 5 Reassessment of Adherence to Treatment Plan ASSESSMENTS - Wound and Skin Assessment / Reassessment X - Simple Wound Assessment / Reassessment - one wound 1 5 '[]'$  - 0 Complex Wound Assessment / Reassessment - multiple wounds '[]'$  - 0 Dermatologic / Skin Assessment (not related to wound area) ASSESSMENTS - Focused Assessment '[]'$  - Circumferential Edema Measurements - multi extremities 0 '[]'$  - 0 Nutritional Assessment / Counseling / Intervention '[]'$  - 0 Lower Extremity Assessment (monofilament, tuning fork, pulses) '[]'$  - 0 Peripheral Arterial Disease Assessment (using hand held doppler) ASSESSMENTS - Ostomy and/or Continence Assessment and Care '[]'$  - Incontinence Assessment and Management 0 '[]'$  - 0 Ostomy Care Assessment and Management (repouching, etc.) PROCESS - Coordination of Care X - Simple Patient / Family Education for ongoing care 1 15 '[]'$  - 0 Complex (extensive) Patient / Family Education for ongoing care '[]'$  - 0 Staff obtains Programmer, systems, Records, Test Results / Process Orders '[]'$  - 0 Staff telephones HHA, Nursing Homes / Clarify orders / etc '[]'$  - 0 Routine Transfer to another Facility (non-emergent condition) '[]'$  - 0 Routine Hospital Admission (non-emergent condition) '[]'$  - 0 New Admissions / Biomedical engineer / Ordering NPWT, Apligraf, etc. '[]'$  - 0 Emergency Hospital Admission (emergent condition) X- 1 10 Simple Discharge Coordination '[]'$  - 0 Complex (extensive) Discharge Coordination PROCESS - Special Needs '[]'$  - Pediatric / Minor Patient Management 0 '[]'$  - 0 Isolation Patient Management '[]'$  - 0 Hearing  / Language / Visual special needs '[]'$  - 0 Assessment of Community assistance (transportation, D/C planning, etc.) '[]'$  - 0 Additional assistance / Altered mentation '[]'$  - 0 Support Surface(s) Assessment (bed, cushion, seat, etc.)  INTERVENTIONS - Wound Cleansing / Measurement [name], [name] ([id number]) X- 1 5 Simple Wound Cleansing - one wound '[]'$  - 0 Complex Wound Cleansing - multiple wounds X- 1 5 Wound Imaging (photographs - any number of wounds) '[]'$  - 0 Wound Tracing (instead of photographs) X- 1 5 Simple Wound Measurement - one wound '[]'$  - 0 Complex Wound Measurement - multiple wounds INTERVENTIONS - Wound Dressings '[]'$  - Small Wound Dressing one or multiple wounds 0 '[]'$  - 0 Medium Wound Dressing one or multiple wounds '[]'$  - 0 Large Wound Dressing one or multiple wounds '[]'$  - 0 Application of Medications - topical '[]'$  - 0 Application of Medications - injection INTERVENTIONS - Miscellaneous '[]'$  - External ear exam 0 '[]'$  - 0 Specimen Collection (cultures, biopsies, blood, body fluids, etc.) '[]'$  - 0 Specimen(s) / Culture(s) sent or taken to Lab for analysis '[]'$  - 0 Patient Transfer (multiple staff / Civil Service fast streamer / Similar devices) '[]'$  - 0 Simple Staple / Suture removal (25 or less) '[]'$  - 0 Complex Staple / Suture removal (26 or more) '[]'$  - 0 Hypo / Hyperglycemic Management (close monitor of Blood Glucose) '[]'$  - 0 Ankle / Brachial Index (ABI) - do not check if billed separately X- 1 5 Vital Signs Has the patient been seen at the hospital within the last three years: Yes Total Score: 65 Level Of Care: New/Established - Level 2 Electronic Signature(s) Signed: 05/27/2021 7:55:31 AM By: Tammy Coria RN Entered By: [name] on 05/26/2021 08:27:46 [name], [name] ([id number]) -------------------------------------------------------------------------------- Encounter Discharge Information Details Patient Name: [name], [name] Date of Service: 05/26/2021 8:00  AM Medical Record Number: [id number] Patient Account Number: [id number] Date of Birth/Sex: Aug 25, 1929 (87 y.o. F) Treating RN: [name] Primary Care Casen Pryor: [name] Other Clinician: Referring Marty Uy: [name] Treating Mariavictoria Nottingham/Extender: [name] in Treatment: 7 Encounter Discharge Information Items Discharge Condition: Stable Ambulatory Status: Walker Discharge Destination: Home Transportation: Private Auto Accompanied By: self Schedule Follow-up Appointment: Yes Clinical Summary of Care: Patient Declined Electronic Signature(s) Signed: 05/27/2021 7:55:31 AM By: Tammy Coria RN Entered By: [name] on 05/26/2021 08:28:54 Trostel, [name] ([id number]) -------------------------------------------------------------------------------- Lower Extremity Assessment Details Patient Name: Musolino, [name] Date of Service: 05/26/2021 8:00 AM Medical Record Number: [id number] Patient Account Number: [id number] Date of Birth/Sex: Mar 03, 1929 (87 y.o. F) Treating RN: [name] Primary Care Qunisha Bryk: [name] Other Clinician: Referring Levis Nazir: [name] Treating Delylah Stanczyk/Extender: [name] in Treatment: 7 Edema Assessment Assessed: [Left: No] [Right: No] [Left: Edema] [Right: :] Calf Left: Right: Point of Measurement: 38 cm From Medial Instep 30 cm Ankle Left: Right: Point of Measurement: 10 cm From Medial Instep 17 cm Knee To Floor Left: Right: From Medial Instep 38 cm Vascular Assessment Pulses: Dorsalis Pedis Palpable: [Left:Yes] Electronic Signature(s) Signed: 05/27/2021 7:55:31 AM By: Tammy Coria RN Entered By: [name] on 05/26/2021 08:25:49 Perin, [name] ([id number]) -------------------------------------------------------------------------------- Multi Wound Chart Details Patient Name: Menzie, [name] Date of Service: 05/26/2021 8:00 AM Medical Record Number: [id number] Patient  Account Number: [id number] Date of Birth/Sex: 17-Nov-1928 (87 y.o. 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 Comorbid History: Chronic Obstructive Pulmonary N/A N/A Disease (COPD),  Arrhythmia, Coronary Artery Disease, Hypertension, Type II Diabetes, Osteoarthritis, Neuropathy Date Acquired: 05/12/2021 N/A N/A Weeks of Treatment: 1 N/A N/A Wound Status: Open N/A N/A Measurements L x W x D (cm) 0x0x0 N/A N/A Area (cm) : 0 N/A N/A Volume (cm) : 0 N/A N/A % Reduction in Area: 100.00% N/A N/A % Reduction in Volume: 100.00% N/A N/A Classification: Grade 2 N/A N/A Exudate Amount: None Present N/A N/A Granulation Amount: None Present (0%) N/A N/A Necrotic Amount: None Present (0%) N/A N/A Exposed Structures: Fascia: No N/A N/A Fat Layer (Subcutaneous Tissue): No Tendon: No Muscle: No Joint: No Bone: No Epithelialization: None N/A N/A Treatment Notes Electronic Signature(s) Signed: 05/27/2021 7:55:31 AM By: Tammy Coria RN Entered By: [name] on 05/26/2021 08:26:07 Montecalvo, [name] ([id number]) -------------------------------------------------------------------------------- [place] Details Patient Name: [name], [name] Date of Service: 05/26/2021 8:00 AM Medical Record Number: [id number] Patient Account Number: [id number] Date of Birth/Sex: June 22, 1929 (87 y.o. F) Treating RN: [name] Primary Care Jovannie Ulibarri: [name] Other Clinician: Referring Amariya Liskey: [name] Treating Jolie Strohecker/Extender: [name] in Treatment: 7 Active Inactive Electronic Signature(s) Signed: 05/27/2021 7:55:31 AM By: Tammy Coria RN Entered By: [name] on 05/26/2021 08:26:00 Ferch, Dakotah Carlean Hall ([id number]) -------------------------------------------------------------------------------- Pain Assessment Details Patient Name: Davitt, [name] Date of Service: 05/26/2021 8:00 AM Medical Record Number: [id number] Patient Account Number: [id number] Date of Birth/Sex: 05-02-1929 (87 y.o. F) Treating RN: [name] Primary Care Jair Lindblad: [name] Other Clinician: Referring Vineet Kinney: [name] Treating Nonna Renninger/Extender: [name] in Treatment: 7 Active Problems Location of Pain Severity and Description of Pain Patient Has Paino No Site Locations Pain Management and Medication Current Pain Management: Electronic Signature(s) Signed: 05/27/2021 7:55:31 AM By: Tammy Coria RN Entered By: [name] on 05/26/2021 08:12:26 Wittmeyer, [name] ([id number]) -------------------------------------------------------------------------------- Patient/Caregiver Education Details Patient Name: [name], [name] Date of Service: 05/26/2021 8:00 AM Medical Record Number: [id number] Patient Account Number: [id number] Date of Birth/Gender: 1928-11-03 (87 y.o. F) Treating RN: [name] Primary Care Physician: [name] Other Clinician: Referring Physician: Miguel Hall Treating Physician/Extender: [name] in Treatment: 7 Education Assessment Education Provided To: Patient Education Topics Provided Wound/Skin Impairment: Methods: Explain/Verbal Responses: State content correctly Electronic Signature(s) Signed: 05/27/2021 7:55:31 AM By: Tammy Coria RN Entered By: [name] on 05/26/2021 08:28:01 [name], [name]  ([id number]) -------------------------------------------------------------------------------- Wound Assessment Details Patient Name: [name], [name] Date of Service: 05/26/2021 8:00 AM Medical Record Number: [id number] Patient Account Number: [id number] Date of Birth/Sex: March 07, 1929 (87 y.o. F) Treating RN: [name] Primary Care Ulah Olmo: [name] Other Clinician: Referring Arita Severtson: [name] Treating Emmalee Solivan/Extender: [name] in Treatment: 7 Wound Status Wound Number: 4 Primary Diabetic Wound/Ulcer of the Lower Extremity Etiology: Wound Location: Left Ankle Wound Open Wounding Event: Gradually Appeared Status: Date Acquired: 05/12/2021 Comorbid Chronic Obstructive Pulmonary Disease (COPD), Weeks Of Treatment: 1 History: Arrhythmia, Coronary Artery Disease, Hypertension, Type Clustered Wound: No II Diabetes, Osteoarthritis, Neuropathy Photos Wound Measurements Length: (cm) 0 Width: (cm) 0 Depth: (cm) 0 Area: (cm) 0 Volume: (cm) 0 % Reduction in Area: 100% % Reduction in Volume: 100% Epithelialization: None Tunneling: No Undermining: No Wound Description Classification: Grade 2 Exudate Amount: None Present Foul Odor After Cleansing: No Slough/Fibrino No Wound Bed Granulation Amount: None Present (0%) Exposed Structure Necrotic Amount: None Present (0%) Fascia Exposed: No Fat Layer (Subcutaneous Tissue) Exposed: No Tendon Exposed: No Muscle Exposed: No Joint Exposed: No Bone Exposed: No Electronic Signature(s) Signed: 05/27/2021 7:55:31 AM By: Tammy Coria RN Entered  By: [name] on 05/26/2021 08:24:08 [name], [name] ([id number]) -------------------------------------------------------------------------------- [place] Details Patient Name: Tammy Blade L. Date of Service: 05/26/2021 8:00 AM Medical Record Number: [id number] Patient Account Number: [id number] Date of Birth/Sex: Feb 10, 1929 (87 y.o. F) Treating RN:  [name] Primary Care Nakul Avino: [name] Other Clinician: Referring Ziona Wickens: [name] Treating Domenique Quest/Extender: [name] in Treatment: 7 Vital Signs Time Taken: 08:11 Temperature (F): 98.1 Height (in): 62 Pulse (bpm): 93 Respiratory Rate (breaths/min): 18 Blood Pressure (mmHg): 188/78 Reference Range: 80 - 120 mg / dl Electronic Signature(s) Signed: 05/27/2021 7:55:31 AM By: Tammy Coria RN Entered By: [name] on 05/26/2021 08:12:18

## 2021-05-27 NOTE — Telephone Encounter (Signed)
Valid encounter. No future visit at this time

## 2021-05-28 ENCOUNTER — Telehealth: Payer: Self-pay

## 2021-05-28 MED ORDER — TACROLIMUS 0.1 % EX OINT: TOPICAL_OINTMENT | Freq: Two times a day (BID) | CUTANEOUS | 0 refills | Status: DC

## 2021-05-28 NOTE — Telephone Encounter (Signed)
Eucrisa not covered for [employer]. Will need to try and fail Tacrolimus first.

## 2021-05-28 NOTE — Telephone Encounter (Signed)
RX sent in and patient advised. 

## 2021-06-03 DIAGNOSIS — Z794 Long term (current) use of insulin: Secondary | ICD-10-CM | POA: Diagnosis not present

## 2021-06-03 DIAGNOSIS — E1165 Type 2 diabetes mellitus with hyperglycemia: Secondary | ICD-10-CM | POA: Diagnosis not present

## 2021-06-23 ENCOUNTER — Encounter: Payer: Self-pay | Admitting: Family Medicine

## 2021-06-25 ENCOUNTER — Ambulatory Visit (INDEPENDENT_AMBULATORY_CARE_PROVIDER_SITE_OTHER): Payer: Medicare Other | Admitting: Family Medicine

## 2021-06-25 ENCOUNTER — Encounter: Payer: Self-pay | Admitting: Family Medicine

## 2021-06-25 ENCOUNTER — Other Ambulatory Visit: Payer: Self-pay

## 2021-06-25 VITALS — BP 145/79 | HR 92 | Temp 97.7°F | Wt 133.0 lb

## 2021-06-25 DIAGNOSIS — G301 Alzheimer's disease with late onset: Secondary | ICD-10-CM

## 2021-06-25 DIAGNOSIS — I480 Paroxysmal atrial fibrillation: Secondary | ICD-10-CM

## 2021-06-25 DIAGNOSIS — F0280 Dementia in other diseases classified elsewhere without behavioral disturbance: Secondary | ICD-10-CM

## 2021-06-25 DIAGNOSIS — Z23 Encounter for immunization: Secondary | ICD-10-CM

## 2021-06-25 DIAGNOSIS — G3183 Dementia with Lewy bodies: Secondary | ICD-10-CM

## 2021-06-25 DIAGNOSIS — E1159 Type 2 diabetes mellitus with other circulatory complications: Secondary | ICD-10-CM

## 2021-06-25 NOTE — Patient Instructions (Signed)
STOP FLUOXETINE (PROZAC), MAGNESIUM, MAG-OX, MONTELUKAST, AND TRAZODONE.

## 2021-06-25 NOTE — Progress Notes (Signed)
Established patient visit   Patient: [name]   DOB: [birth year]   87 y.o. Female  MRN: [id number] Visit Date: 06/25/2021  Today's healthcare provider: Wilhemena Durie, MD   Chief Complaint  Patient presents with   Hallucinations   Subjective    HPI  Hallucinations:  Pt's son report Ms Forgione is "seeing people that are not there."   It has been worsening in the last 6 weeks.  She is not having behavioral issues due to this problem with hallucinations.  She did however call the police last week after 1 hallucination.  All other issues are stable. We discussed making her life simpler by taking away medication she does not absolutely need.   Medications: Outpatient Medications Prior to Visit  Medication Sig   ADVAIR HFA 230-21 MCG/ACT inhaler TAKE 2 PUFFS INTO LUNGS TWICE A DAY (Patient taking differently: Inhale 2 puffs into the lungs 2 (two) times daily.)   albuterol (VENTOLIN HFA) 108 (90 Base) MCG/ACT inhaler INHALE 2 PUFFS INTO THE LUNGS EVERY 4 HOURS AS NEEDED FOR WHEEZING ORSHORTNESS OF BREATH (Patient taking differently: Inhale 2 puffs into the lungs every 4 (four) hours as needed for wheezing or shortness of breath. INHALE 2 PUFFS INTO THE LUNGS EVERY 4 HOURS AS NEEDED FOR WHEEZING ORSHORTNESS OF BREATH)   aspirin 325 MG tablet Take 325 mg by mouth daily. Reported on 01/05/2016   atorvastatin (LIPITOR) 10 MG tablet Take 1 tablet (10 mg total) by mouth daily.   cetirizine (ZYRTEC) 10 MG tablet TAKE ONE TABLET BY MOUTH EVERY DAY (Patient taking differently: Take 10 mg by mouth daily.)   cholecalciferol (VITAMIN D3) 25 MCG (1000 UT) tablet Take 1,000 Units by mouth daily.   clopidogrel (PLAVIX) 75 MG tablet TAKE 1 TABLET BY MOUTH DAILY   Crisaborole (EUCRISA) 2 % OINT Apply 1 application topically 2 (two) times daily.   diltiazem (TIADYLT ER) 360 MG 24 hr capsule Hold until followup with your outpatient doctor due to your blood pressure on the low side.    ezetimibe (ZETIA) 10 MG tablet TAKE 1 TABLET BY MOUTH DAILY   FLUoxetine (PROZAC) 20 MG capsule Take 1 capsule (20 mg total) by mouth daily.   glucose blood test strip ACCU-CHEK AVIVA PLUS TEST STRP Use to check sugars 3 times daily.   insulin glargine (LANTUS) 100 UNIT/ML injection Inject 0.05 mLs (5 Units total) into the skin at bedtime.   insulin lispro (HUMALOG) 100 UNIT/ML KiwkPen Inject 10 Units into the skin 3 (three) times daily. 10 units before breakfast, 8 before lunch and 18 before supper   iron polysaccharides (NIFEREX) 150 MG capsule Take 1 capsule (150 mg total) by mouth daily.   isosorbide mononitrate (IMDUR) 30 MG 24 hr tablet Take 30 mg by mouth daily.   lansoprazole (PREVACID) 30 MG capsule TAKE 1 CAPSULE BY MOUTH ONCE DAILY AT NOON (Patient taking differently: Take 30 mg by mouth daily at 12 noon. TAKE 1 CAPSULE BY MOUTH ONCE DAILY AT NOON)   losartan (COZAAR) 100 MG tablet Take 0.5 tablets (50 mg total) by mouth daily. This is a decrease from 100 mg daily.   Magnesium 400 MG CAPS Take by mouth daily.   magnesium oxide (MAG-OX) 400 MG tablet Take 400 mg by mouth daily.   meloxicam (MOBIC) 7.5 MG tablet Take 1 tablet (7.5 mg total) by mouth daily as needed for pain.   memantine (NAMENDA) 5 MG tablet TAKE ONE TABLET TWICE DAILY  montelukast (SINGULAIR) 10 MG tablet TAKE ONE TABLET AT BEDTIME (Patient taking differently: Take 10 mg by mouth at bedtime.)   ondansetron (ZOFRAN-ODT) 4 MG disintegrating tablet Take 4 mg by mouth every 8 (eight) hours as needed for nausea. For up to 12 doses.   pantoprazole (PROTONIX) 40 MG tablet Take 40 mg by mouth daily.   QUEtiapine (SEROQUEL) 25 MG tablet Take 1 tablet by mouth at bedtime.   tacrolimus (PROTOPIC) 0.1 % ointment Apply topically 2 (two) times daily.   traZODone (DESYREL) 50 MG tablet TAKE 1 AND 1/2 TABLET AT BEDTIME AS NEEDED FOR SLEEP   TUSSIN DM 100-10 MG/5ML liquid TAKE 1 TEASPOONFUL BY MOUTH EVERY 4 HOURS AS NEEDED FOR COUGH    vitamin B-12 1000 MCG tablet Take 1 tablet (1,000 mcg total) by mouth daily.   vitamin E 1000 UNIT capsule Take 1,000 Units by mouth daily.   No facility-administered medications prior to visit.    Review of Systems  Constitutional: Negative.   Cardiovascular: Negative.   Gastrointestinal: Negative.   Neurological:  Negative for seizures, syncope and speech difficulty.  Psychiatric/Behavioral:  Positive for hallucinations and sleep disturbance. Negative for confusion, decreased concentration, dysphoric mood, self-injury and suicidal ideas. The patient is nervous/anxious.       Objective    BP (!) 145/79 (BP Location: Right Arm, Patient Position: Sitting, Cuff Size: Normal)   Pulse 92   Temp 97.7 F (36.5 C) (Oral)   Wt 133 lb (60.3 kg)   SpO2 94%   BMI 24.33 kg/m    Physical Exam Vitals reviewed.  Constitutional:      Appearance: She is well-developed.  HENT:     Head: Normocephalic and atraumatic.  Eyes:     General: No scleral icterus.    Conjunctiva/sclera: Conjunctivae normal.  Neck:     Thyroid: No thyromegaly.     Vascular: No carotid bruit.     Comments: She has bilateral carotid bruit. Cardiovascular:     Rate and Rhythm: Normal rate and regular rhythm.  Pulmonary:     Effort: Pulmonary effort is normal.  Abdominal:     Palpations: Abdomen is soft.  Skin:    General: Skin is warm and dry.     Findings: Laceration present.     Comments: Very fair skin.   Neurological:     Mental Status: She is alert and oriented to person, place, and time. Mental status is at baseline.  Psychiatric:        Mood and Affect: Mood normal.        Behavior: Behavior normal.      No results found for any visits on 06/25/21.  Assessment & Plan     1. Late onset Alzheimer's dementia without behavioral disturbance ([place]) This fits most with Lewy body dementia but patient had the atrophy on MRI couple years ago.  Refer back to neurology. Discussion with son we will try to  life a little simpler by stopping all medication and she does not absolutely need.  At this time stop fluoxetine, magnesium, montelukast, and trazodone. More than 50% 25 minute visit spent in counseling or coordination of care  - Ambulatory referral to Neurology  2. Need for influenza vaccination  - Flu Vaccine QUAD High Dose(Fluad)  3. Type 2 diabetes mellitus with vascular disease ([place]) By endocrinology  4. Paroxysmal atrial fibrillation ([facility])   5. Lewy body dementia without behavioral disturbance ([place])    Return in about 5 weeks (around 07/29/2021).  I, Wilhemena Durie, MD, have reviewed all documentation for this visit. The documentation on 06/26/21 for the exam, diagnosis, procedures, and orders are all accurate and complete.    Bernita Beckstrom Cranford Mon, MD  [facility] [phone] (phone) [phone] (fax)  [place]

## 2021-06-26 ENCOUNTER — Other Ambulatory Visit: Payer: Self-pay | Admitting: Family Medicine

## 2021-07-06 ENCOUNTER — Ambulatory Visit (INDEPENDENT_AMBULATORY_CARE_PROVIDER_SITE_OTHER): Payer: Medicare Other

## 2021-07-06 DIAGNOSIS — Z794 Long term (current) use of insulin: Secondary | ICD-10-CM

## 2021-07-06 DIAGNOSIS — F0280 Dementia in other diseases classified elsewhere without behavioral disturbance: Secondary | ICD-10-CM

## 2021-07-06 DIAGNOSIS — E1121 Type 2 diabetes mellitus with diabetic nephropathy: Secondary | ICD-10-CM

## 2021-07-06 NOTE — Patient Instructions (Addendum)
Thank you for allowing the Chronic Care Management team to participate in your care.    Patient Care Plan: Wellness/Cognitive Function     Problem Identified: Wellness/Cognitive Function      Long-Range Goal: Optimal Cognitive Function Maintained   Start Date: 07/06/2021  Expected End Date: 10/04/2021  Priority: High  Note:   Current Barriers: Chronic Care Management support and education needs r/t Late onset Alzheimer's Dementia in patient with HTN, DM and COPD.    Care Management Goal(s):  Over the next 30 days, patient will complete outreach with a Neurologist. Over the next 90 days, patient will maintain optimal level of cognitive function.  Interventions:  Collaboration with Jerrol Banana., MD regarding development and update of comprehensive plan of care as evidenced by provider attestation and co-signature Inter-disciplinary care team collaboration (see longitudinal plan of care) Reviewed current treatment plan r/t mild cognitive changes with patient's daughter/caregiver [name].  Reports Mrs. Kelner has experienced a significant increase in visual and auditory hallucinations. Notes she has become very fearful. Reports discontinuing medications as advised by PCP. The family is aware that it may take several weeks before changes/improvement in behavior are noted.  Discussed in-home needs. Reviewed safety measures. Patient currently lives alone and the family remains very supportive. They agree that she would benefit from placement/supervised care due to significant cognitive decline.  Will follow up to discuss plan for placement. Reviewed pending referrals. A referral was recently placed for [facility]. [name] will contact the clinic to schedule the first available appointment. Message forwarded to provider as requested. The family will keep the clinic and care management team updated of needs.   Patient Goals/Self Care Activities:  Hold specified medications as  instructed by PCP Complete outreach with the Neurology team Contact provider or care management team with questions or new concerns   Follow Up Plan:  Will follow up in two weeks       Mrs. Noh's daughter/caregiver [name] verbalized understanding of the information discussed during the telephonic outreach. Declined need for mailed/printed instructions. A member of the care management team will follow up in two weeks.   Cristy [facility] [phone]

## 2021-07-06 NOTE — Chronic Care Management (AMB) (Signed)
Chronic Care Management   CCM RN Visit Note  07/06/2021 Name: [name] MRN: [id number] DOB: 10-25-1928  Subjective: [name] is a 87 y.o. year old female who is a primary care patient of Jerrol Banana., MD. The care management team was consulted for assistance with disease management and care coordination needs.    Engaged with patient's daughter/caregiver [name] by telephone for follow up visit in response to provider referral for case management and care coordination services.   Consent to Services:  The patient was given information about Chronic Care Management services, agreed to services, and gave verbal consent prior to initiation of services.  Please see initial visit note for detailed documentation.    Assessment: Review of patient past medical history, allergies, medications, health status, including review of consultants reports, laboratory and other test data, was performed as part of comprehensive evaluation and provision of chronic care management services.   SDOH (Social Determinants of Health) assessments and interventions performed:  No  CCM Care Plan  Allergies  Allergen Reactions   Bacitracin-Neomycin-Polymyxin Rash   Neomycin-Bacitracin Zn-Polymyx Swelling and Rash   Latex Rash   Lidocaine Rash   Aricept [Donepezil Hcl] Diarrhea   Benzalkonium Chloride Itching and Swelling   Ibuprofen     Other reaction(s): Dizziness Heart fluttering. Tachycardia. Tachycardia.   Lidocaine Hcl Itching    Per pt, "all caine meds cause severe itching"   Valdecoxib Nausea And Vomiting   Albuterol Rash   Tape Rash    blisters   Triamcinolone Rash    Outpatient Encounter Medications as of 07/06/2021  Medication Sig   ADVAIR HFA 230-21 MCG/ACT inhaler TAKE 2 PUFFS INTO LUNGS TWICE A DAY (Patient taking differently: Inhale 2 puffs into the lungs 2 (two) times daily.)   albuterol (VENTOLIN HFA) 108 (90 Base) MCG/ACT inhaler INHALE 2 PUFFS INTO  THE LUNGS EVERY 4 HOURS AS NEEDED FOR WHEEZING ORSHORTNESS OF BREATH (Patient taking differently: Inhale 2 puffs into the lungs every 4 (four) hours as needed for wheezing or shortness of breath. INHALE 2 PUFFS INTO THE LUNGS EVERY 4 HOURS AS NEEDED FOR WHEEZING ORSHORTNESS OF BREATH)   aspirin 325 MG tablet Take 325 mg by mouth daily. Reported on 01/05/2016   atorvastatin (LIPITOR) 10 MG tablet Take 1 tablet (10 mg total) by mouth daily.   cetirizine (ZYRTEC) 10 MG tablet TAKE ONE TABLET BY MOUTH EVERY DAY (Patient taking differently: Take 10 mg by mouth daily.)   cholecalciferol (VITAMIN D3) 25 MCG (1000 UT) tablet Take 1,000 Units by mouth daily.   clopidogrel (PLAVIX) 75 MG tablet TAKE 1 TABLET BY MOUTH DAILY   Crisaborole (EUCRISA) 2 % OINT Apply 1 application topically 2 (two) times daily.   diltiazem (TIADYLT ER) 360 MG 24 hr capsule Hold until followup with your outpatient doctor due to your blood pressure on the low side.   ezetimibe (ZETIA) 10 MG tablet TAKE 1 TABLET BY MOUTH DAILY   FLUoxetine (PROZAC) 20 MG capsule Take 1 capsule (20 mg total) by mouth daily.   glucose blood test strip ACCU-CHEK AVIVA PLUS TEST STRP Use to check sugars 3 times daily.   insulin glargine (LANTUS) 100 UNIT/ML injection Inject 0.05 mLs (5 Units total) into the skin at bedtime.   insulin lispro (HUMALOG) 100 UNIT/ML KiwkPen Inject 10 Units into the skin 3 (three) times daily. 10 units before breakfast, 8 before lunch and 18 before supper   iron polysaccharides (NIFEREX) 150 MG capsule Take 1 capsule (150  mg total) by mouth daily.   isosorbide mononitrate (IMDUR) 30 MG 24 hr tablet Take 30 mg by mouth daily.   lansoprazole (PREVACID) 30 MG capsule TAKE 1 CAPSULE BY MOUTH ONCE DAILY AT NOON   losartan (COZAAR) 100 MG tablet Take 0.5 tablets (50 mg total) by mouth daily. This is a decrease from 100 mg daily.   Magnesium 400 MG CAPS Take by mouth daily.   magnesium oxide (MAG-OX) 400 MG tablet Take 400 mg by  mouth daily.   meloxicam (MOBIC) 7.5 MG tablet Take 1 tablet (7.5 mg total) by mouth daily as needed for pain.   memantine (NAMENDA) 5 MG tablet TAKE ONE TABLET TWICE DAILY   montelukast (SINGULAIR) 10 MG tablet TAKE ONE TABLET AT BEDTIME (Patient taking differently: Take 10 mg by mouth at bedtime.)   ondansetron (ZOFRAN-ODT) 4 MG disintegrating tablet Take 4 mg by mouth every 8 (eight) hours as needed for nausea. For up to 12 doses.   pantoprazole (PROTONIX) 40 MG tablet Take 40 mg by mouth daily.   QUEtiapine (SEROQUEL) 25 MG tablet Take 1 tablet by mouth at bedtime.   tacrolimus (PROTOPIC) 0.1 % ointment Apply topically 2 (two) times daily.   traZODone (DESYREL) 50 MG tablet TAKE 1 AND 1/2 TABLET AT BEDTIME AS NEEDED FOR SLEEP   TUSSIN DM 100-10 MG/5ML liquid TAKE 1 TEASPOONFUL BY MOUTH EVERY 4 HOURS AS NEEDED FOR COUGH   vitamin B-12 1000 MCG tablet Take 1 tablet (1,000 mcg total) by mouth daily.   vitamin E 1000 UNIT capsule Take 1,000 Units by mouth daily.   No facility-administered encounter medications on file as of 07/06/2021.    Patient Active Problem List   Diagnosis Date Noted   Atherosclerosis of native arteries of the extremities with ulceration ([place]) 04/07/2021   PSVT (paroxysmal supraventricular tachycardia) ([place]) 95/28/4132   Acute metabolic encephalopathy 44/09/270   Frequent falls 02/10/2021   COVID-19 virus infection 02/10/2021   Chronic kidney disease, stage 3a ([place]) 02/10/2021   Dementia ([place]) 02/10/2021   Garbled speech, intermittent 02/10/2021   Laceration without foreign body, left ankle, subsequent encounter 02/10/2021   AMS (altered mental status) 02/10/2021   Coronary artery disease involving native coronary artery of native heart 04/17/2020   Celiac artery stenosis ([place]) 06/12/2019   Bilateral lower abdominal cramping 04/20/2019   UTI symptoms 04/20/2019   History of rectal bleeding 03/26/2019   DM type 2 with diabetic peripheral neuropathy ([place])  12/14/2018   Arthropathy of lumbar facet joint 09/04/2018   Degeneration of lumbar intervertebral disc 09/04/2018   Spondylolisthesis, grade 1 09/04/2018   Lewy body dementia ([place]) 02/17/2018   PVD (peripheral vascular disease) ([place]) 08/16/2017   Pain in limb 07/27/2016   Hallucinations 07/07/2016   Chest pain 07/01/2016   Depression 06/23/2016   Hypokalemia 09/03/2015   Insomnia 07/23/2015   Headache 07/23/2015   Type 2 diabetes mellitus with vascular disease ([place]) 06/24/2015   Chronic vulvitis 06/02/2015   Allergic rhinitis 05/30/2015   Anemia 05/30/2015   Back ache 05/30/2015   Body mass index (BMI) of 29.0-29.9 in adult 05/30/2015   Edema 05/30/2015   Dilatation of esophagus 05/30/2015   Fall 05/30/2015   H/O deep venous thrombosis 05/30/2015   Hypercholesteremia 05/30/2015   Heart & renal disease, hypertensive, with heart failure ([place]) 05/30/2015   Hyponatremia 05/30/2015   Calculus of kidney 05/30/2015   Gonalgia 05/30/2015   Psoriasis 05/30/2015   Avitaminosis D 05/30/2015   Cough 04/01/2015   Combined fat and  carbohydrate induced hyperlipemia 12/30/2014   Paroxysmal atrial fibrillation ([place]) 07/10/2014   Abnormal gait 07/08/2014   Anxiety state 07/08/2014   Chronic kidney disease 07/08/2014   Acid reflux 07/08/2014   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[place]) 07/08/2014   Chronic obstructive pulmonary disease ([place]) 07/08/2014   Type II diabetes mellitus with renal manifestations ([place]) 07/08/2014   Benign essential HTN 06/26/2014   Carotid artery narrowing 06/26/2014   Myalgia 06/26/2014   Basal cell carcinoma of face 05/01/2014   Disease of female genital organs 11/28/2012   Incomplete bladder emptying 09/05/2012   Excessive urination at night 08/15/2012   Basal cell carcinoma of ear 10/26/2011    Conditions to be addressed/monitored: Late Onset Alzheimer's Patient Care Plan: Wellness/Cognitive Function     Problem Identified: Wellness/Cognitive Function       Long-Range Goal: Optimal Cognitive Function Maintained   Start Date: 07/06/2021  Expected End Date: 10/04/2021  Priority: High  Note:   Current Barriers: Chronic Care Management support and education needs r/t Late onset Alzheimer's Dementia in patient with HTN, DM and COPD.    Care Management Goal(s):  Over the next 30 days, patient will complete outreach with a Neurologist. Over the next 90 days, patient will maintain optimal level of cognitive function.  Interventions:  Collaboration with Jerrol Banana., MD regarding development and update of comprehensive plan of care as evidenced by provider attestation and co-signature Inter-disciplinary care team collaboration (see longitudinal plan of care) Reviewed current treatment plan r/t mild cognitive changes with patient's daughter/caregiver [name].  Reports Mrs. Miotke has experienced a significant increase in visual and auditory hallucinations. Notes she has become very fearful. Reports discontinuing medications as advised by PCP. The family is aware that it may take several weeks before changes/improvement in behavior are noted.  Discussed in-home needs. Reviewed safety measures. Patient currently lives alone and the family remains very supportive. They agree that she would benefit from placement/supervised care due to significant cognitive decline.  Will follow up to discuss plan for placement. Reviewed pending referrals. A referral was recently placed for [facility]. [name] will contact the clinic to schedule the first available appointment. Message forwarded to provider as requested. The family will keep the clinic and care management team updated of needs.   Patient Goals/Self Care Activities:  Hold specified medications as instructed by PCP Complete outreach with the Neurology team Contact provider or care management team with questions or new concerns   Follow Up Plan:  Will follow up in two weeks         PLAN A member of the care management team will follow up in two weeks.   [facility] Care Management [facility] [phone]

## 2021-07-07 ENCOUNTER — Encounter (INDEPENDENT_AMBULATORY_CARE_PROVIDER_SITE_OTHER): Payer: Medicare Other

## 2021-07-07 ENCOUNTER — Ambulatory Visit (INDEPENDENT_AMBULATORY_CARE_PROVIDER_SITE_OTHER): Payer: Medicare Other | Admitting: Vascular Surgery

## 2021-07-07 DIAGNOSIS — G3183 Dementia with Lewy bodies: Secondary | ICD-10-CM | POA: Insufficient documentation

## 2021-07-07 DIAGNOSIS — R413 Other amnesia: Secondary | ICD-10-CM | POA: Diagnosis not present

## 2021-07-07 DIAGNOSIS — F02818 Dementia in other diseases classified elsewhere, unspecified severity, with other behavioral disturbance: Secondary | ICD-10-CM | POA: Diagnosis not present

## 2021-07-08 ENCOUNTER — Other Ambulatory Visit (HOSPITAL_COMMUNITY): Payer: Self-pay | Admitting: Neurology

## 2021-07-08 ENCOUNTER — Other Ambulatory Visit: Payer: Self-pay | Admitting: Neurology

## 2021-07-08 DIAGNOSIS — G3183 Dementia with Lewy bodies: Secondary | ICD-10-CM

## 2021-07-08 DIAGNOSIS — R413 Other amnesia: Secondary | ICD-10-CM

## 2021-07-08 DIAGNOSIS — F02818 Dementia in other diseases classified elsewhere, unspecified severity, with other behavioral disturbance: Secondary | ICD-10-CM

## 2021-07-14 DIAGNOSIS — F0283 Dementia in other diseases classified elsewhere, unspecified severity, with mood disturbance: Secondary | ICD-10-CM | POA: Diagnosis not present

## 2021-07-14 DIAGNOSIS — Z9181 History of falling: Secondary | ICD-10-CM | POA: Insufficient documentation

## 2021-07-14 DIAGNOSIS — I70219 Atherosclerosis of native arteries of extremities with intermittent claudication, unspecified extremity: Secondary | ICD-10-CM | POA: Diagnosis not present

## 2021-07-14 DIAGNOSIS — I129 Hypertensive chronic kidney disease with stage 1 through stage 4 chronic kidney disease, or unspecified chronic kidney disease: Secondary | ICD-10-CM | POA: Diagnosis not present

## 2021-07-14 DIAGNOSIS — E1122 Type 2 diabetes mellitus with diabetic chronic kidney disease: Secondary | ICD-10-CM | POA: Diagnosis not present

## 2021-07-14 DIAGNOSIS — F02818 Dementia in other diseases classified elsewhere, unspecified severity, with other behavioral disturbance: Secondary | ICD-10-CM | POA: Diagnosis not present

## 2021-07-14 DIAGNOSIS — J449 Chronic obstructive pulmonary disease, unspecified: Secondary | ICD-10-CM | POA: Diagnosis present

## 2021-07-14 DIAGNOSIS — I700 Atherosclerosis of aorta: Secondary | ICD-10-CM | POA: Diagnosis not present

## 2021-07-14 DIAGNOSIS — Z794 Long term (current) use of insulin: Secondary | ICD-10-CM | POA: Diagnosis not present

## 2021-07-14 DIAGNOSIS — Z85820 Personal history of malignant melanoma of skin: Secondary | ICD-10-CM | POA: Diagnosis not present

## 2021-07-14 DIAGNOSIS — E1151 Type 2 diabetes mellitus with diabetic peripheral angiopathy without gangrene: Secondary | ICD-10-CM | POA: Diagnosis not present

## 2021-07-14 DIAGNOSIS — F0282 Dementia in other diseases classified elsewhere, unspecified severity, with psychotic disturbance: Secondary | ICD-10-CM | POA: Diagnosis not present

## 2021-07-14 DIAGNOSIS — N189 Chronic kidney disease, unspecified: Secondary | ICD-10-CM | POA: Diagnosis not present

## 2021-07-14 DIAGNOSIS — I480 Paroxysmal atrial fibrillation: Secondary | ICD-10-CM | POA: Diagnosis not present

## 2021-07-16 DIAGNOSIS — Z85820 Personal history of malignant melanoma of skin: Secondary | ICD-10-CM | POA: Diagnosis not present

## 2021-07-16 DIAGNOSIS — I700 Atherosclerosis of aorta: Secondary | ICD-10-CM | POA: Diagnosis not present

## 2021-07-16 DIAGNOSIS — Z794 Long term (current) use of insulin: Secondary | ICD-10-CM | POA: Diagnosis not present

## 2021-07-16 DIAGNOSIS — J449 Chronic obstructive pulmonary disease, unspecified: Secondary | ICD-10-CM | POA: Diagnosis not present

## 2021-07-16 DIAGNOSIS — F0282 Dementia in other diseases classified elsewhere, unspecified severity, with psychotic disturbance: Secondary | ICD-10-CM | POA: Diagnosis not present

## 2021-07-16 DIAGNOSIS — F02818 Dementia in other diseases classified elsewhere, unspecified severity, with other behavioral disturbance: Secondary | ICD-10-CM | POA: Diagnosis not present

## 2021-07-16 DIAGNOSIS — E1122 Type 2 diabetes mellitus with diabetic chronic kidney disease: Secondary | ICD-10-CM | POA: Diagnosis not present

## 2021-07-16 DIAGNOSIS — F0283 Dementia in other diseases classified elsewhere, unspecified severity, with mood disturbance: Secondary | ICD-10-CM | POA: Diagnosis not present

## 2021-07-16 DIAGNOSIS — I129 Hypertensive chronic kidney disease with stage 1 through stage 4 chronic kidney disease, or unspecified chronic kidney disease: Secondary | ICD-10-CM | POA: Diagnosis not present

## 2021-07-16 DIAGNOSIS — E1151 Type 2 diabetes mellitus with diabetic peripheral angiopathy without gangrene: Secondary | ICD-10-CM | POA: Diagnosis not present

## 2021-07-16 DIAGNOSIS — N189 Chronic kidney disease, unspecified: Secondary | ICD-10-CM | POA: Diagnosis not present

## 2021-07-16 DIAGNOSIS — Z9181 History of falling: Secondary | ICD-10-CM | POA: Diagnosis not present

## 2021-07-16 DIAGNOSIS — I480 Paroxysmal atrial fibrillation: Secondary | ICD-10-CM | POA: Diagnosis not present

## 2021-07-16 DIAGNOSIS — I70219 Atherosclerosis of native arteries of extremities with intermittent claudication, unspecified extremity: Secondary | ICD-10-CM | POA: Diagnosis not present

## 2021-07-17 ENCOUNTER — Other Ambulatory Visit: Payer: Self-pay

## 2021-07-17 ENCOUNTER — Ambulatory Visit
Admission: RE | Admit: 2021-07-17 | Discharge: 2021-07-17 | Disposition: A | Discharge status: Home / Self Care | Payer: Medicare Other | Source: Ambulatory Visit | Attending: Neurology | Admitting: Neurology

## 2021-07-17 DIAGNOSIS — Z20822 Contact with and (suspected) exposure to covid-19: Secondary | ICD-10-CM | POA: Diagnosis not present

## 2021-07-17 DIAGNOSIS — R413 Other amnesia: Secondary | ICD-10-CM

## 2021-07-17 DIAGNOSIS — G3183 Dementia with Lewy bodies: Secondary | ICD-10-CM | POA: Insufficient documentation

## 2021-07-17 DIAGNOSIS — F02818 Dementia in other diseases classified elsewhere, unspecified severity, with other behavioral disturbance: Secondary | ICD-10-CM | POA: Insufficient documentation

## 2021-07-17 DIAGNOSIS — Z03818 Encounter for observation for suspected exposure to other biological agents ruled out: Secondary | ICD-10-CM | POA: Diagnosis not present

## 2021-07-20 DIAGNOSIS — F0283 Dementia in other diseases classified elsewhere, unspecified severity, with mood disturbance: Secondary | ICD-10-CM | POA: Diagnosis not present

## 2021-07-20 DIAGNOSIS — I129 Hypertensive chronic kidney disease with stage 1 through stage 4 chronic kidney disease, or unspecified chronic kidney disease: Secondary | ICD-10-CM | POA: Diagnosis not present

## 2021-07-20 DIAGNOSIS — I70219 Atherosclerosis of native arteries of extremities with intermittent claudication, unspecified extremity: Secondary | ICD-10-CM | POA: Diagnosis not present

## 2021-07-20 DIAGNOSIS — F02818 Dementia in other diseases classified elsewhere, unspecified severity, with other behavioral disturbance: Secondary | ICD-10-CM | POA: Diagnosis not present

## 2021-07-20 DIAGNOSIS — I480 Paroxysmal atrial fibrillation: Secondary | ICD-10-CM | POA: Diagnosis not present

## 2021-07-20 DIAGNOSIS — Z85820 Personal history of malignant melanoma of skin: Secondary | ICD-10-CM | POA: Diagnosis not present

## 2021-07-20 DIAGNOSIS — J449 Chronic obstructive pulmonary disease, unspecified: Secondary | ICD-10-CM | POA: Diagnosis not present

## 2021-07-20 DIAGNOSIS — F0282 Dementia in other diseases classified elsewhere, unspecified severity, with psychotic disturbance: Secondary | ICD-10-CM | POA: Diagnosis not present

## 2021-07-20 DIAGNOSIS — E1122 Type 2 diabetes mellitus with diabetic chronic kidney disease: Secondary | ICD-10-CM | POA: Diagnosis not present

## 2021-07-20 DIAGNOSIS — I700 Atherosclerosis of aorta: Secondary | ICD-10-CM | POA: Diagnosis not present

## 2021-07-20 DIAGNOSIS — E1151 Type 2 diabetes mellitus with diabetic peripheral angiopathy without gangrene: Secondary | ICD-10-CM | POA: Diagnosis not present

## 2021-07-20 DIAGNOSIS — Z9181 History of falling: Secondary | ICD-10-CM | POA: Diagnosis not present

## 2021-07-20 DIAGNOSIS — Z794 Long term (current) use of insulin: Secondary | ICD-10-CM | POA: Diagnosis not present

## 2021-07-20 DIAGNOSIS — N189 Chronic kidney disease, unspecified: Secondary | ICD-10-CM | POA: Diagnosis not present

## 2021-07-22 DIAGNOSIS — J449 Chronic obstructive pulmonary disease, unspecified: Secondary | ICD-10-CM | POA: Diagnosis not present

## 2021-07-22 DIAGNOSIS — F0282 Dementia in other diseases classified elsewhere, unspecified severity, with psychotic disturbance: Secondary | ICD-10-CM | POA: Diagnosis not present

## 2021-07-22 DIAGNOSIS — Z9181 History of falling: Secondary | ICD-10-CM | POA: Diagnosis not present

## 2021-07-22 DIAGNOSIS — I70219 Atherosclerosis of native arteries of extremities with intermittent claudication, unspecified extremity: Secondary | ICD-10-CM | POA: Diagnosis not present

## 2021-07-22 DIAGNOSIS — Z85820 Personal history of malignant melanoma of skin: Secondary | ICD-10-CM | POA: Diagnosis not present

## 2021-07-22 DIAGNOSIS — F0283 Dementia in other diseases classified elsewhere, unspecified severity, with mood disturbance: Secondary | ICD-10-CM | POA: Diagnosis not present

## 2021-07-22 DIAGNOSIS — N189 Chronic kidney disease, unspecified: Secondary | ICD-10-CM | POA: Diagnosis not present

## 2021-07-22 DIAGNOSIS — I700 Atherosclerosis of aorta: Secondary | ICD-10-CM | POA: Diagnosis not present

## 2021-07-22 DIAGNOSIS — F02818 Dementia in other diseases classified elsewhere, unspecified severity, with other behavioral disturbance: Secondary | ICD-10-CM | POA: Diagnosis not present

## 2021-07-22 DIAGNOSIS — E1151 Type 2 diabetes mellitus with diabetic peripheral angiopathy without gangrene: Secondary | ICD-10-CM | POA: Diagnosis not present

## 2021-07-22 DIAGNOSIS — Z794 Long term (current) use of insulin: Secondary | ICD-10-CM | POA: Diagnosis not present

## 2021-07-22 DIAGNOSIS — E1122 Type 2 diabetes mellitus with diabetic chronic kidney disease: Secondary | ICD-10-CM | POA: Diagnosis not present

## 2021-07-22 DIAGNOSIS — I129 Hypertensive chronic kidney disease with stage 1 through stage 4 chronic kidney disease, or unspecified chronic kidney disease: Secondary | ICD-10-CM | POA: Diagnosis not present

## 2021-07-22 DIAGNOSIS — I480 Paroxysmal atrial fibrillation: Secondary | ICD-10-CM | POA: Diagnosis not present

## 2021-07-24 DIAGNOSIS — F02818 Dementia in other diseases classified elsewhere, unspecified severity, with other behavioral disturbance: Secondary | ICD-10-CM | POA: Diagnosis not present

## 2021-07-24 DIAGNOSIS — J449 Chronic obstructive pulmonary disease, unspecified: Secondary | ICD-10-CM | POA: Diagnosis not present

## 2021-07-24 DIAGNOSIS — I129 Hypertensive chronic kidney disease with stage 1 through stage 4 chronic kidney disease, or unspecified chronic kidney disease: Secondary | ICD-10-CM | POA: Diagnosis not present

## 2021-07-24 DIAGNOSIS — Z85820 Personal history of malignant melanoma of skin: Secondary | ICD-10-CM | POA: Diagnosis not present

## 2021-07-24 DIAGNOSIS — N189 Chronic kidney disease, unspecified: Secondary | ICD-10-CM | POA: Diagnosis not present

## 2021-07-24 DIAGNOSIS — E1122 Type 2 diabetes mellitus with diabetic chronic kidney disease: Secondary | ICD-10-CM | POA: Diagnosis not present

## 2021-07-24 DIAGNOSIS — Z794 Long term (current) use of insulin: Secondary | ICD-10-CM | POA: Diagnosis not present

## 2021-07-24 DIAGNOSIS — I700 Atherosclerosis of aorta: Secondary | ICD-10-CM | POA: Diagnosis not present

## 2021-07-24 DIAGNOSIS — I70219 Atherosclerosis of native arteries of extremities with intermittent claudication, unspecified extremity: Secondary | ICD-10-CM | POA: Diagnosis not present

## 2021-07-24 DIAGNOSIS — F0283 Dementia in other diseases classified elsewhere, unspecified severity, with mood disturbance: Secondary | ICD-10-CM | POA: Diagnosis not present

## 2021-07-24 DIAGNOSIS — I480 Paroxysmal atrial fibrillation: Secondary | ICD-10-CM | POA: Diagnosis not present

## 2021-07-24 DIAGNOSIS — E1151 Type 2 diabetes mellitus with diabetic peripheral angiopathy without gangrene: Secondary | ICD-10-CM | POA: Diagnosis not present

## 2021-07-24 DIAGNOSIS — Z9181 History of falling: Secondary | ICD-10-CM | POA: Diagnosis not present

## 2021-07-24 DIAGNOSIS — F0282 Dementia in other diseases classified elsewhere, unspecified severity, with psychotic disturbance: Secondary | ICD-10-CM | POA: Diagnosis not present

## 2021-07-25 ENCOUNTER — Other Ambulatory Visit: Payer: Self-pay | Admitting: Family Medicine

## 2021-07-25 NOTE — Telephone Encounter (Signed)
Requested Prescriptions  Pending Prescriptions Disp Refills  . clopidogrel (PLAVIX) 75 MG tablet [Pharmacy Med Name: CLOPIDOGREL BISULFATE 75 MG TAB] 30 tablet 3    Sig: TAKE 1 TABLET BY MOUTH DAILY     Hematology: Antiplatelets - clopidogrel Failed - 07/25/2021  1:41 PM      Failed - Evaluate AST, ALT within 2 months of therapy initiation.      Failed - HCT in normal range and within 180 days    Hematocrit  Date Value Ref Range Status  05/08/2021 32.2 (L) 34.0 - 46.6 % Final         Failed - HGB in normal range and within 180 days    Hemoglobin  Date Value Ref Range Status  05/08/2021 9.6 (L) 11.1 - 15.9 g/dL Final         Passed - ALT in normal range and within 360 days    ALT  Date Value Ref Range Status  05/08/2021 9 0 - 32 IU/L Final   SGPT (ALT)  Date Value Ref Range Status  02/06/2012 20 U/L Final    Comment:    12-78 NOTE: NEW REFERENCE RANGE 08/20/2011          Passed - AST in normal range and within 360 days    AST  Date Value Ref Range Status  05/08/2021 19 0 - 40 IU/L Final   SGOT(AST)  Date Value Ref Range Status  02/06/2012 22 15 - 37 Unit/L Final         Passed - PLT in normal range and within 180 days    Platelets  Date Value Ref Range Status  05/08/2021 331 150 - 450 x10E3/uL Final         Passed - Valid encounter within last 6 months    Recent Outpatient Visits          1 month ago Late onset Alzheimer's dementia without behavioral disturbance [facility])   [facility] [name]., MD   2 months ago Benign essential HTN   [facility] [name]., MD   5 months ago Laceration without foreign body, left ankle, subsequent encounter   [facility] Chrismon, Vickki Muff, PA-C   6 months ago Benign essential HTN   [facility] [name]., MD   8 months ago Anemia, unspecified type   [facility] [name]., MD      Future  Appointments            In 4 days [name]., MD [facility] [place]

## 2021-07-27 DIAGNOSIS — G301 Alzheimer's disease with late onset: Secondary | ICD-10-CM | POA: Diagnosis not present

## 2021-07-27 DIAGNOSIS — F0280 Dementia in other diseases classified elsewhere without behavioral disturbance: Secondary | ICD-10-CM | POA: Diagnosis not present

## 2021-07-27 DIAGNOSIS — Z794 Long term (current) use of insulin: Secondary | ICD-10-CM

## 2021-07-27 DIAGNOSIS — E1121 Type 2 diabetes mellitus with diabetic nephropathy: Secondary | ICD-10-CM

## 2021-07-29 ENCOUNTER — Ambulatory Visit (INDEPENDENT_AMBULATORY_CARE_PROVIDER_SITE_OTHER): Payer: Medicare Other | Admitting: Family Medicine

## 2021-07-29 ENCOUNTER — Other Ambulatory Visit: Payer: Self-pay

## 2021-07-29 VITALS — BP 177/79 | HR 81 | Temp 97.9°F | Resp 96 | Wt 131.0 lb

## 2021-07-29 DIAGNOSIS — I739 Peripheral vascular disease, unspecified: Secondary | ICD-10-CM

## 2021-07-29 DIAGNOSIS — F02B4 Dementia in other diseases classified elsewhere, moderate, with anxiety: Secondary | ICD-10-CM | POA: Diagnosis not present

## 2021-07-29 DIAGNOSIS — G3183 Dementia with Lewy bodies: Secondary | ICD-10-CM | POA: Diagnosis not present

## 2021-07-29 DIAGNOSIS — I6523 Occlusion and stenosis of bilateral carotid arteries: Secondary | ICD-10-CM | POA: Diagnosis not present

## 2021-07-29 DIAGNOSIS — E1121 Type 2 diabetes mellitus with diabetic nephropathy: Secondary | ICD-10-CM

## 2021-07-29 DIAGNOSIS — I480 Paroxysmal atrial fibrillation: Secondary | ICD-10-CM | POA: Diagnosis not present

## 2021-07-29 DIAGNOSIS — Z794 Long term (current) use of insulin: Secondary | ICD-10-CM | POA: Diagnosis not present

## 2021-07-29 DIAGNOSIS — C439 Malignant melanoma of skin, unspecified: Secondary | ICD-10-CM | POA: Diagnosis not present

## 2021-07-29 NOTE — Progress Notes (Signed)
Established patient visit   Patient: [name]   DOB: 05/04/1929   87 y.o. Female  MRN: [id number] Visit Date: 07/29/2021  Today's healthcare provider: Wilhemena Durie, MD   No chief complaint on file.  Subjective    HPI  Patient is a 87 year old female who presents for 1 month follow up of late onset Alzheimer's Dementia.  She was last seen on 06/25/21.  At that time management changes include instructing patient to stop fluoxetine, magnesium, montelukast, and trazodone. Patient states she feels better but daughter says she if still having the same symptoms.  Still seeing things and people in her house.  She is not having behavioral issues.  She has not been herself in danger. One of the daughter's thinks it  is a vision problem that is causing the hallucinations.    Medications: Outpatient Medications Prior to Visit  Medication Sig   ADVAIR HFA 230-21 MCG/ACT inhaler TAKE 2 PUFFS INTO LUNGS TWICE A DAY (Patient taking differently: Inhale 2 puffs into the lungs 2 (two) times daily.)   albuterol (VENTOLIN HFA) 108 (90 Base) MCG/ACT inhaler INHALE 2 PUFFS INTO THE LUNGS EVERY 4 HOURS AS NEEDED FOR WHEEZING ORSHORTNESS OF BREATH (Patient taking differently: Inhale 2 puffs into the lungs every 4 (four) hours as needed for wheezing or shortness of breath. INHALE 2 PUFFS INTO THE LUNGS EVERY 4 HOURS AS NEEDED FOR WHEEZING ORSHORTNESS OF BREATH)   aspirin 325 MG tablet Take 325 mg by mouth daily. Reported on 01/05/2016   atorvastatin (LIPITOR) 10 MG tablet Take 1 tablet (10 mg total) by mouth daily.   cetirizine (ZYRTEC) 10 MG tablet TAKE ONE TABLET BY MOUTH EVERY DAY (Patient taking differently: Take 10 mg by mouth daily.)   cholecalciferol (VITAMIN D3) 25 MCG (1000 UT) tablet Take 1,000 Units by mouth daily.   clopidogrel (PLAVIX) 75 MG tablet TAKE 1 TABLET BY MOUTH DAILY   Crisaborole (EUCRISA) 2 % OINT Apply 1 application topically 2 (two) times daily.   diltiazem  (TIADYLT ER) 360 MG 24 hr capsule Hold until followup with your outpatient doctor due to your blood pressure on the low side.   ezetimibe (ZETIA) 10 MG tablet TAKE 1 TABLET BY MOUTH DAILY   FLUoxetine (PROZAC) 20 MG capsule Take 1 capsule (20 mg total) by mouth daily. (Patient not taking: Reported on 07/06/2021)   glucose blood test strip ACCU-CHEK AVIVA PLUS TEST STRP Use to check sugars 3 times daily.   insulin glargine (LANTUS) 100 UNIT/ML injection Inject 0.05 mLs (5 Units total) into the skin at bedtime.   insulin lispro (HUMALOG) 100 UNIT/ML KiwkPen Inject 10 Units into the skin 3 (three) times daily. 10 units before breakfast, 8 before lunch and 18 before supper   iron polysaccharides (NIFEREX) 150 MG capsule Take 1 capsule (150 mg total) by mouth daily.   isosorbide mononitrate (IMDUR) 30 MG 24 hr tablet Take 30 mg by mouth daily.   lansoprazole (PREVACID) 30 MG capsule TAKE 1 CAPSULE BY MOUTH ONCE DAILY AT NOON   losartan (COZAAR) 100 MG tablet Take 0.5 tablets (50 mg total) by mouth daily. This is a decrease from 100 mg daily.   Magnesium 400 MG CAPS Take by mouth daily. (Patient not taking: Reported on 07/06/2021)   magnesium oxide (MAG-OX) 400 MG tablet Take 400 mg by mouth daily.   meloxicam (MOBIC) 7.5 MG tablet Take 1 tablet (7.5 mg total) by mouth daily as needed for pain.  memantine (NAMENDA) 5 MG tablet TAKE ONE TABLET TWICE DAILY   montelukast (SINGULAIR) 10 MG tablet TAKE ONE TABLET AT BEDTIME (Patient not taking: Reported on 07/06/2021)   ondansetron (ZOFRAN-ODT) 4 MG disintegrating tablet Take 4 mg by mouth every 8 (eight) hours as needed for nausea. For up to 12 doses.   pantoprazole (PROTONIX) 40 MG tablet Take 40 mg by mouth daily.   QUEtiapine (SEROQUEL) 25 MG tablet Take 1 tablet by mouth at bedtime.   tacrolimus (PROTOPIC) 0.1 % ointment Apply topically 2 (two) times daily.   traZODone (DESYREL) 50 MG tablet TAKE 1 AND 1/2 TABLET AT BEDTIME AS NEEDED FOR SLEEP (Patient  not taking: Reported on 07/06/2021)   TUSSIN DM 100-10 MG/5ML liquid TAKE 1 TEASPOONFUL BY MOUTH EVERY 4 HOURS AS NEEDED FOR COUGH   vitamin B-12 1000 MCG tablet Take 1 tablet (1,000 mcg total) by mouth daily.   vitamin E 1000 UNIT capsule Take 1,000 Units by mouth daily.   No facility-administered medications prior to visit.    Review of Systems  Respiratory:  Negative for shortness of breath.   Cardiovascular:  Negative for chest pain.  Psychiatric/Behavioral:  Positive for agitation, confusion, decreased concentration and hallucinations. Negative for sleep disturbance. The patient is not nervous/anxious.        Objective    BP (!) 177/79 (BP Location: Right Arm, Patient Position: Sitting, Cuff Size: Normal)   Pulse 81   Temp 97.9 F (36.6 C) (Oral)   Resp (!) 96   Wt 131 lb (59.4 kg)   BMI 23.96 kg/m  BP Readings from Last 3 Encounters:  07/29/21 (!) 177/79  06/25/21 (!) 145/79  05/07/21 (!) 174/78   Wt Readings from Last 3 Encounters:  07/29/21 131 lb (59.4 kg)  06/25/21 133 lb (60.3 kg)  05/07/21 142 lb 6.4 oz (64.6 kg)      Physical Exam Vitals reviewed.  Constitutional:      Appearance: She is well-developed.  HENT:     Head: Normocephalic and atraumatic.  Eyes:     General: No scleral icterus.    Conjunctiva/sclera: Conjunctivae normal.  Neck:     Thyroid: No thyromegaly.     Vascular: No carotid bruit.     Comments: She has bilateral carotid bruit. Cardiovascular:     Rate and Rhythm: Normal rate and regular rhythm.  Pulmonary:     Effort: Pulmonary effort is normal.  Abdominal:     Palpations: Abdomen is soft.  Skin:    General: Skin is warm and dry.     Findings: Laceration present.     Comments: Very fair skin.   Neurological:     Mental Status: She is alert and oriented to person, place, and time. Mental status is at baseline.  Psychiatric:        Mood and Affect: Mood normal.        Behavior: Behavior normal.      No results found for  any visits on 07/29/21.  Assessment & Plan     1. Moderate Lewy body dementia with anxiety ([place]) No behavioral disturbances.  Discussed with daughter that with the symptoms I would not increase her Seroquel at this time as it will not take away the hallucinations.  On Namenda I think the long-term answer is the patient will need placement in assisted living.  2. Type 2 diabetes mellitus with diabetic nephropathy, with long-term current use of insulin ([place]) Followed by endocrinology.  3. 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[place]) Multiple skin  problems followed by dermatology  4. PVD (peripheral vascular disease) ([place]) All risk factors treated on atorvastatin.  5. Bilateral carotid artery stenosis   6. Paroxysmal atrial fibrillation ([facility]) On Plavix and full dose aspirin   No follow-ups on file.      I, Wilhemena Durie, MD, have reviewed all documentation for this visit. The documentation on 08/03/21 for the exam, diagnosis, procedures, and orders are all accurate and complete.    Murray Guzzetta Cranford Mon, MD  [facility] [phone] (phone) [phone] (fax)  [place]

## 2021-07-30 DIAGNOSIS — I700 Atherosclerosis of aorta: Secondary | ICD-10-CM | POA: Diagnosis not present

## 2021-07-30 DIAGNOSIS — F02818 Dementia in other diseases classified elsewhere, unspecified severity, with other behavioral disturbance: Secondary | ICD-10-CM | POA: Diagnosis not present

## 2021-07-30 DIAGNOSIS — I480 Paroxysmal atrial fibrillation: Secondary | ICD-10-CM | POA: Diagnosis not present

## 2021-07-30 DIAGNOSIS — E1122 Type 2 diabetes mellitus with diabetic chronic kidney disease: Secondary | ICD-10-CM | POA: Diagnosis not present

## 2021-07-30 DIAGNOSIS — I70219 Atherosclerosis of native arteries of extremities with intermittent claudication, unspecified extremity: Secondary | ICD-10-CM | POA: Diagnosis not present

## 2021-07-30 DIAGNOSIS — J449 Chronic obstructive pulmonary disease, unspecified: Secondary | ICD-10-CM | POA: Diagnosis not present

## 2021-07-30 DIAGNOSIS — N189 Chronic kidney disease, unspecified: Secondary | ICD-10-CM | POA: Diagnosis not present

## 2021-07-30 DIAGNOSIS — Z794 Long term (current) use of insulin: Secondary | ICD-10-CM | POA: Diagnosis not present

## 2021-07-30 DIAGNOSIS — I129 Hypertensive chronic kidney disease with stage 1 through stage 4 chronic kidney disease, or unspecified chronic kidney disease: Secondary | ICD-10-CM | POA: Diagnosis not present

## 2021-07-30 DIAGNOSIS — Z85820 Personal history of malignant melanoma of skin: Secondary | ICD-10-CM | POA: Diagnosis not present

## 2021-07-30 DIAGNOSIS — F0282 Dementia in other diseases classified elsewhere, unspecified severity, with psychotic disturbance: Secondary | ICD-10-CM | POA: Diagnosis not present

## 2021-07-30 DIAGNOSIS — F0283 Dementia in other diseases classified elsewhere, unspecified severity, with mood disturbance: Secondary | ICD-10-CM | POA: Diagnosis not present

## 2021-07-30 DIAGNOSIS — E1151 Type 2 diabetes mellitus with diabetic peripheral angiopathy without gangrene: Secondary | ICD-10-CM | POA: Diagnosis not present

## 2021-07-30 DIAGNOSIS — Z9181 History of falling: Secondary | ICD-10-CM | POA: Diagnosis not present

## 2021-08-05 DIAGNOSIS — F0282 Dementia in other diseases classified elsewhere, unspecified severity, with psychotic disturbance: Secondary | ICD-10-CM | POA: Diagnosis not present

## 2021-08-05 DIAGNOSIS — Z9181 History of falling: Secondary | ICD-10-CM | POA: Diagnosis not present

## 2021-08-05 DIAGNOSIS — F02818 Dementia in other diseases classified elsewhere, unspecified severity, with other behavioral disturbance: Secondary | ICD-10-CM | POA: Diagnosis not present

## 2021-08-05 DIAGNOSIS — Z85820 Personal history of malignant melanoma of skin: Secondary | ICD-10-CM | POA: Diagnosis not present

## 2021-08-05 DIAGNOSIS — E1122 Type 2 diabetes mellitus with diabetic chronic kidney disease: Secondary | ICD-10-CM | POA: Diagnosis not present

## 2021-08-05 DIAGNOSIS — E1151 Type 2 diabetes mellitus with diabetic peripheral angiopathy without gangrene: Secondary | ICD-10-CM | POA: Diagnosis not present

## 2021-08-05 DIAGNOSIS — J449 Chronic obstructive pulmonary disease, unspecified: Secondary | ICD-10-CM | POA: Diagnosis not present

## 2021-08-05 DIAGNOSIS — N189 Chronic kidney disease, unspecified: Secondary | ICD-10-CM | POA: Diagnosis not present

## 2021-08-05 DIAGNOSIS — I129 Hypertensive chronic kidney disease with stage 1 through stage 4 chronic kidney disease, or unspecified chronic kidney disease: Secondary | ICD-10-CM | POA: Diagnosis not present

## 2021-08-05 DIAGNOSIS — I480 Paroxysmal atrial fibrillation: Secondary | ICD-10-CM | POA: Diagnosis not present

## 2021-08-05 DIAGNOSIS — I70219 Atherosclerosis of native arteries of extremities with intermittent claudication, unspecified extremity: Secondary | ICD-10-CM | POA: Diagnosis not present

## 2021-08-05 DIAGNOSIS — Z794 Long term (current) use of insulin: Secondary | ICD-10-CM | POA: Diagnosis not present

## 2021-08-05 DIAGNOSIS — F0283 Dementia in other diseases classified elsewhere, unspecified severity, with mood disturbance: Secondary | ICD-10-CM | POA: Diagnosis not present

## 2021-08-05 DIAGNOSIS — I700 Atherosclerosis of aorta: Secondary | ICD-10-CM | POA: Diagnosis not present

## 2021-08-09 DIAGNOSIS — Z794 Long term (current) use of insulin: Secondary | ICD-10-CM | POA: Diagnosis not present

## 2021-08-09 DIAGNOSIS — E1165 Type 2 diabetes mellitus with hyperglycemia: Secondary | ICD-10-CM | POA: Diagnosis not present

## 2021-08-11 DIAGNOSIS — Z9181 History of falling: Secondary | ICD-10-CM | POA: Diagnosis not present

## 2021-08-11 DIAGNOSIS — J449 Chronic obstructive pulmonary disease, unspecified: Secondary | ICD-10-CM | POA: Diagnosis not present

## 2021-08-11 DIAGNOSIS — Z85820 Personal history of malignant melanoma of skin: Secondary | ICD-10-CM | POA: Diagnosis not present

## 2021-08-11 DIAGNOSIS — F02818 Dementia in other diseases classified elsewhere, unspecified severity, with other behavioral disturbance: Secondary | ICD-10-CM | POA: Diagnosis not present

## 2021-08-11 DIAGNOSIS — F0282 Dementia in other diseases classified elsewhere, unspecified severity, with psychotic disturbance: Secondary | ICD-10-CM | POA: Diagnosis not present

## 2021-08-11 DIAGNOSIS — Z794 Long term (current) use of insulin: Secondary | ICD-10-CM | POA: Diagnosis not present

## 2021-08-11 DIAGNOSIS — I700 Atherosclerosis of aorta: Secondary | ICD-10-CM | POA: Diagnosis not present

## 2021-08-11 DIAGNOSIS — N189 Chronic kidney disease, unspecified: Secondary | ICD-10-CM | POA: Diagnosis not present

## 2021-08-11 DIAGNOSIS — I70219 Atherosclerosis of native arteries of extremities with intermittent claudication, unspecified extremity: Secondary | ICD-10-CM | POA: Diagnosis not present

## 2021-08-11 DIAGNOSIS — I129 Hypertensive chronic kidney disease with stage 1 through stage 4 chronic kidney disease, or unspecified chronic kidney disease: Secondary | ICD-10-CM | POA: Diagnosis not present

## 2021-08-11 DIAGNOSIS — E1151 Type 2 diabetes mellitus with diabetic peripheral angiopathy without gangrene: Secondary | ICD-10-CM | POA: Diagnosis not present

## 2021-08-11 DIAGNOSIS — E1122 Type 2 diabetes mellitus with diabetic chronic kidney disease: Secondary | ICD-10-CM | POA: Diagnosis not present

## 2021-08-11 DIAGNOSIS — I480 Paroxysmal atrial fibrillation: Secondary | ICD-10-CM | POA: Diagnosis not present

## 2021-08-11 DIAGNOSIS — F0283 Dementia in other diseases classified elsewhere, unspecified severity, with mood disturbance: Secondary | ICD-10-CM | POA: Diagnosis not present

## 2021-08-19 DIAGNOSIS — E1122 Type 2 diabetes mellitus with diabetic chronic kidney disease: Secondary | ICD-10-CM | POA: Diagnosis not present

## 2021-08-19 DIAGNOSIS — F02818 Dementia in other diseases classified elsewhere, unspecified severity, with other behavioral disturbance: Secondary | ICD-10-CM | POA: Diagnosis not present

## 2021-08-19 DIAGNOSIS — J449 Chronic obstructive pulmonary disease, unspecified: Secondary | ICD-10-CM | POA: Diagnosis not present

## 2021-08-19 DIAGNOSIS — E1151 Type 2 diabetes mellitus with diabetic peripheral angiopathy without gangrene: Secondary | ICD-10-CM | POA: Diagnosis not present

## 2021-08-19 DIAGNOSIS — Z85820 Personal history of malignant melanoma of skin: Secondary | ICD-10-CM | POA: Diagnosis not present

## 2021-08-19 DIAGNOSIS — N189 Chronic kidney disease, unspecified: Secondary | ICD-10-CM | POA: Diagnosis not present

## 2021-08-19 DIAGNOSIS — F0283 Dementia in other diseases classified elsewhere, unspecified severity, with mood disturbance: Secondary | ICD-10-CM | POA: Diagnosis not present

## 2021-08-19 DIAGNOSIS — Z794 Long term (current) use of insulin: Secondary | ICD-10-CM | POA: Diagnosis not present

## 2021-08-19 DIAGNOSIS — I480 Paroxysmal atrial fibrillation: Secondary | ICD-10-CM | POA: Diagnosis not present

## 2021-08-19 DIAGNOSIS — Z9181 History of falling: Secondary | ICD-10-CM | POA: Diagnosis not present

## 2021-08-19 DIAGNOSIS — I129 Hypertensive chronic kidney disease with stage 1 through stage 4 chronic kidney disease, or unspecified chronic kidney disease: Secondary | ICD-10-CM | POA: Diagnosis not present

## 2021-08-19 DIAGNOSIS — I70219 Atherosclerosis of native arteries of extremities with intermittent claudication, unspecified extremity: Secondary | ICD-10-CM | POA: Diagnosis not present

## 2021-08-19 DIAGNOSIS — I700 Atherosclerosis of aorta: Secondary | ICD-10-CM | POA: Diagnosis not present

## 2021-08-19 DIAGNOSIS — F0282 Dementia in other diseases classified elsewhere, unspecified severity, with psychotic disturbance: Secondary | ICD-10-CM | POA: Diagnosis not present

## 2021-08-26 DIAGNOSIS — Z794 Long term (current) use of insulin: Secondary | ICD-10-CM | POA: Diagnosis not present

## 2021-08-26 DIAGNOSIS — Z9181 History of falling: Secondary | ICD-10-CM | POA: Diagnosis not present

## 2021-08-26 DIAGNOSIS — E1151 Type 2 diabetes mellitus with diabetic peripheral angiopathy without gangrene: Secondary | ICD-10-CM | POA: Diagnosis not present

## 2021-08-26 DIAGNOSIS — I70219 Atherosclerosis of native arteries of extremities with intermittent claudication, unspecified extremity: Secondary | ICD-10-CM | POA: Diagnosis not present

## 2021-08-26 DIAGNOSIS — I129 Hypertensive chronic kidney disease with stage 1 through stage 4 chronic kidney disease, or unspecified chronic kidney disease: Secondary | ICD-10-CM | POA: Diagnosis not present

## 2021-08-26 DIAGNOSIS — N189 Chronic kidney disease, unspecified: Secondary | ICD-10-CM | POA: Diagnosis not present

## 2021-08-26 DIAGNOSIS — F0282 Dementia in other diseases classified elsewhere, unspecified severity, with psychotic disturbance: Secondary | ICD-10-CM | POA: Diagnosis not present

## 2021-08-26 DIAGNOSIS — J449 Chronic obstructive pulmonary disease, unspecified: Secondary | ICD-10-CM | POA: Diagnosis not present

## 2021-08-26 DIAGNOSIS — E1122 Type 2 diabetes mellitus with diabetic chronic kidney disease: Secondary | ICD-10-CM | POA: Diagnosis not present

## 2021-08-26 DIAGNOSIS — F02818 Dementia in other diseases classified elsewhere, unspecified severity, with other behavioral disturbance: Secondary | ICD-10-CM | POA: Diagnosis not present

## 2021-08-26 DIAGNOSIS — Z85820 Personal history of malignant melanoma of skin: Secondary | ICD-10-CM | POA: Diagnosis not present

## 2021-08-26 DIAGNOSIS — I480 Paroxysmal atrial fibrillation: Secondary | ICD-10-CM | POA: Diagnosis not present

## 2021-08-26 DIAGNOSIS — I700 Atherosclerosis of aorta: Secondary | ICD-10-CM | POA: Diagnosis not present

## 2021-08-26 DIAGNOSIS — F0283 Dementia in other diseases classified elsewhere, unspecified severity, with mood disturbance: Secondary | ICD-10-CM | POA: Diagnosis not present

## 2021-08-31 DIAGNOSIS — I70219 Atherosclerosis of native arteries of extremities with intermittent claudication, unspecified extremity: Secondary | ICD-10-CM | POA: Diagnosis not present

## 2021-08-31 DIAGNOSIS — F0282 Dementia in other diseases classified elsewhere, unspecified severity, with psychotic disturbance: Secondary | ICD-10-CM | POA: Diagnosis not present

## 2021-08-31 DIAGNOSIS — Z9181 History of falling: Secondary | ICD-10-CM | POA: Diagnosis not present

## 2021-08-31 DIAGNOSIS — F02818 Dementia in other diseases classified elsewhere, unspecified severity, with other behavioral disturbance: Secondary | ICD-10-CM | POA: Diagnosis not present

## 2021-08-31 DIAGNOSIS — E1122 Type 2 diabetes mellitus with diabetic chronic kidney disease: Secondary | ICD-10-CM | POA: Diagnosis not present

## 2021-08-31 DIAGNOSIS — E1151 Type 2 diabetes mellitus with diabetic peripheral angiopathy without gangrene: Secondary | ICD-10-CM | POA: Diagnosis not present

## 2021-08-31 DIAGNOSIS — F0283 Dementia in other diseases classified elsewhere, unspecified severity, with mood disturbance: Secondary | ICD-10-CM | POA: Diagnosis not present

## 2021-08-31 DIAGNOSIS — I700 Atherosclerosis of aorta: Secondary | ICD-10-CM | POA: Diagnosis not present

## 2021-08-31 DIAGNOSIS — Z794 Long term (current) use of insulin: Secondary | ICD-10-CM | POA: Diagnosis not present

## 2021-08-31 DIAGNOSIS — Z85820 Personal history of malignant melanoma of skin: Secondary | ICD-10-CM | POA: Diagnosis not present

## 2021-08-31 DIAGNOSIS — J449 Chronic obstructive pulmonary disease, unspecified: Secondary | ICD-10-CM | POA: Diagnosis not present

## 2021-08-31 DIAGNOSIS — N189 Chronic kidney disease, unspecified: Secondary | ICD-10-CM | POA: Diagnosis not present

## 2021-08-31 DIAGNOSIS — I480 Paroxysmal atrial fibrillation: Secondary | ICD-10-CM | POA: Diagnosis not present

## 2021-08-31 DIAGNOSIS — I129 Hypertensive chronic kidney disease with stage 1 through stage 4 chronic kidney disease, or unspecified chronic kidney disease: Secondary | ICD-10-CM | POA: Diagnosis not present

## 2021-09-10 DIAGNOSIS — I70219 Atherosclerosis of native arteries of extremities with intermittent claudication, unspecified extremity: Secondary | ICD-10-CM | POA: Diagnosis not present

## 2021-09-10 DIAGNOSIS — E1151 Type 2 diabetes mellitus with diabetic peripheral angiopathy without gangrene: Secondary | ICD-10-CM | POA: Diagnosis not present

## 2021-09-10 DIAGNOSIS — E1122 Type 2 diabetes mellitus with diabetic chronic kidney disease: Secondary | ICD-10-CM | POA: Diagnosis not present

## 2021-09-10 DIAGNOSIS — Z9181 History of falling: Secondary | ICD-10-CM | POA: Diagnosis not present

## 2021-09-10 DIAGNOSIS — F0282 Dementia in other diseases classified elsewhere, unspecified severity, with psychotic disturbance: Secondary | ICD-10-CM | POA: Diagnosis not present

## 2021-09-10 DIAGNOSIS — J449 Chronic obstructive pulmonary disease, unspecified: Secondary | ICD-10-CM | POA: Diagnosis not present

## 2021-09-10 DIAGNOSIS — F02818 Dementia in other diseases classified elsewhere, unspecified severity, with other behavioral disturbance: Secondary | ICD-10-CM | POA: Diagnosis not present

## 2021-09-10 DIAGNOSIS — I480 Paroxysmal atrial fibrillation: Secondary | ICD-10-CM | POA: Diagnosis not present

## 2021-09-10 DIAGNOSIS — Z794 Long term (current) use of insulin: Secondary | ICD-10-CM | POA: Diagnosis not present

## 2021-09-10 DIAGNOSIS — I700 Atherosclerosis of aorta: Secondary | ICD-10-CM | POA: Diagnosis not present

## 2021-09-10 DIAGNOSIS — F0283 Dementia in other diseases classified elsewhere, unspecified severity, with mood disturbance: Secondary | ICD-10-CM | POA: Diagnosis not present

## 2021-09-10 DIAGNOSIS — I129 Hypertensive chronic kidney disease with stage 1 through stage 4 chronic kidney disease, or unspecified chronic kidney disease: Secondary | ICD-10-CM | POA: Diagnosis not present

## 2021-09-10 DIAGNOSIS — Z85820 Personal history of malignant melanoma of skin: Secondary | ICD-10-CM | POA: Diagnosis not present

## 2021-09-10 DIAGNOSIS — N189 Chronic kidney disease, unspecified: Secondary | ICD-10-CM | POA: Diagnosis not present

## 2021-09-15 ENCOUNTER — Other Ambulatory Visit: Payer: Self-pay | Admitting: Family Medicine

## 2021-10-13 ENCOUNTER — Other Ambulatory Visit: Payer: Self-pay | Admitting: Family Medicine

## 2021-10-13 DIAGNOSIS — E7800 Pure hypercholesterolemia, unspecified: Secondary | ICD-10-CM

## 2021-10-13 NOTE — Telephone Encounter (Signed)
Requested Prescriptions  Pending Prescriptions Disp Refills   memantine (NAMENDA) 5 MG tablet [Pharmacy Med Name: MEMANTINE HCL 5 MG TAB] 60 tablet 3    Sig: TAKE ONE TABLET TWICE DAILY     Neurology:  Alzheimer's Agents Passed - 10/13/2021 12:13 PM      Passed - Valid encounter within last 6 months    Recent Outpatient Visits          2 months ago Moderate Lewy body dementia with anxiety [facility])   [facility] Tammy Banana., MD   3 months ago Late onset Alzheimer's dementia without behavioral disturbance [facility])   [facility] Tammy Banana., MD   5 months ago Benign essential HTN   [facility] Tammy Banana., MD   7 months ago Laceration without foreign body, left ankle, subsequent encounter   [place], PA-C   8 months ago Benign essential HTN   [facility] Tammy Banana., MD      Future Appointments            In 3 months Tammy Banana., MD [facility], [facility]            atorvastatin (LIPITOR) 10 MG tablet [Pharmacy Med Name: ATORVASTATIN CALCIUM 10 MG TAB] 90 tablet 1    Sig: TAKE 1 TABLET BY MOUTH ONCE DAILY.     Cardiovascular:  Antilipid - Statins Failed - 10/13/2021 12:13 PM      Failed - Triglycerides in normal range and within 360 days    Triglycerides  Date Value Ref Range Status  05/08/2021 209 (H) 0 - 149 mg/dL Final         Passed - Total Cholesterol in normal range and within 360 days    Cholesterol, Total  Date Value Ref Range Status  05/08/2021 118 100 - 199 mg/dL Final         Passed - LDL in normal range and within 360 days    LDL Cholesterol (Calc)  Date Value Ref Range Status  09/13/2017 104 (H) mg/dL (calc) Final    Comment:    Reference range: <100 . Desirable range <100 mg/dL for primary prevention;   <70 mg/dL for patients with CHD or diabetic patients  with > or = 2 CHD risk factors. Marland Kitchen LDL-C  is now calculated using the Martin-Hopkins  calculation, which is a validated novel method providing  better accuracy than the Friedewald equation in the  estimation of LDL-C.  Cresenciano Genre et al. Annamaria Helling. 3300;762(26): 2061-2068  (http://education.[employer].com/faq/FAQ164)    LDL Chol Calc (NIH)  Date Value Ref Range Status  05/08/2021 41 0 - 99 mg/dL Final         Passed - HDL in normal range and within 360 days    HDL  Date Value Ref Range Status  05/08/2021 44 >39 mg/dL Final         Passed - Patient is not pregnant      Passed - Valid encounter within last 12 months    Recent Outpatient Visits          2 months ago Moderate Lewy body dementia with anxiety [facility])   [facility] Tammy Banana., MD   3 months ago Late onset Alzheimer's dementia without behavioral disturbance [facility])   [facility] Tammy Banana., MD   5 months ago Benign essential HTN   [place],  Retia Passe., MD   7 months ago Laceration without foreign body, left ankle, subsequent encounter   [place], Vickki Muff, [place]   8 months ago Benign essential HTN   [facility] Tammy Banana., MD      Future Appointments            In 3 months Tammy Banana., MD [facility], [facility]            ezetimibe (ZETIA) 10 MG tablet [Pharmacy Med Name: EZETIMIBE 10 MG TAB] 90 tablet 1    Sig: TAKE 1 TABLET BY MOUTH DAILY     Cardiovascular:  Antilipid - Sterol Transport Inhibitors Failed - 10/13/2021 12:13 PM      Failed - Triglycerides in normal range and within 360 days    Triglycerides  Date Value Ref Range Status  05/08/2021 209 (H) 0 - 149 mg/dL Final         Passed - Total Cholesterol in normal range and within 360 days    Cholesterol, Total  Date Value Ref Range Status  05/08/2021 118 100 - 199 mg/dL Final         Passed - LDL in normal range and within 360 days     LDL Cholesterol (Calc)  Date Value Ref Range Status  09/13/2017 104 (H) mg/dL (calc) Final    Comment:    Reference range: <100 . Desirable range <100 mg/dL for primary prevention;   <70 mg/dL for patients with CHD or diabetic patients  with > or = 2 CHD risk factors. Marland Kitchen LDL-C is now calculated using the Martin-Hopkins  calculation, which is a validated novel method providing  better accuracy than the Friedewald equation in the  estimation of LDL-C.  Cresenciano Genre et al. Annamaria Helling. 3734;287(68): 2061-2068  (http://education.[employer].com/faq/FAQ164)    LDL Chol Calc (NIH)  Date Value Ref Range Status  05/08/2021 41 0 - 99 mg/dL Final         Passed - HDL in normal range and within 360 days    HDL  Date Value Ref Range Status  05/08/2021 44 >39 mg/dL Final         Passed - Valid encounter within last 12 months    Recent Outpatient Visits          2 months ago Moderate Lewy body dementia with anxiety [facility])   [facility] Tammy Banana., MD   3 months ago Late onset Alzheimer's dementia without behavioral disturbance [facility])   [facility] Tammy Banana., MD   5 months ago Benign essential HTN   [facility] Tammy Banana., MD   7 months ago Laceration without foreign body, left ankle, subsequent encounter   [place], [place]   8 months ago Benign essential HTN   [facility] Tammy Banana., MD      Future Appointments            In 3 months Tammy Banana., MD [facility], [place]

## 2021-11-05 ENCOUNTER — Ambulatory Visit (INDEPENDENT_AMBULATORY_CARE_PROVIDER_SITE_OTHER): Payer: Medicare Other

## 2021-11-05 DIAGNOSIS — E1121 Type 2 diabetes mellitus with diabetic nephropathy: Secondary | ICD-10-CM

## 2021-11-05 DIAGNOSIS — L299 Pruritus, unspecified: Secondary | ICD-10-CM

## 2021-11-05 DIAGNOSIS — Z794 Long term (current) use of insulin: Secondary | ICD-10-CM

## 2021-11-05 DIAGNOSIS — G3183 Dementia with Lewy bodies: Secondary | ICD-10-CM

## 2021-11-05 DIAGNOSIS — Z9181 History of falling: Secondary | ICD-10-CM

## 2021-11-05 NOTE — Chronic Care Management (AMB) (Signed)
Chronic Care Management   CCM RN Visit Note  11/05/2021 Name: [name] MRN: [id number] DOB: 1929-08-31  Subjective: [name] is a 88 y.o. year old female who is a primary care patient of Jerrol Banana., MD. The care management team was consulted for assistance with disease management and care coordination needs.    Engaged with patient by telephone for follow up visit in response to provider referral for case management and care coordination services.   Consent to Services:  The patient was given information about Chronic Care Management services, agreed to services, and gave verbal consent prior to initiation of services.  Please see initial visit note for detailed documentation.    Assessment: Review of patient past medical history, allergies, medications, health status, including review of consultants reports, laboratory and other test data, was performed as part of comprehensive evaluation and provision of chronic care management services.   SDOH (Social Determinants of Health) assessments and interventions performed: No  CCM Care Plan  Allergies  Allergen Reactions   Bacitracin-Neomycin-Polymyxin Rash   Neomycin-Bacitracin Zn-Polymyx Swelling and Rash   Latex Rash   Lidocaine Rash   Aricept [Donepezil Hcl] Diarrhea   Benzalkonium Chloride Itching and Swelling   Ibuprofen     Other reaction(s): Dizziness Heart fluttering. Tachycardia. Tachycardia.   Lidocaine Hcl Itching    Per pt, "all caine meds cause severe itching"   Valdecoxib Nausea And Vomiting   Albuterol Rash   Tape Rash    blisters   Triamcinolone Rash    Outpatient Encounter Medications as of 11/05/2021  Medication Sig   ADVAIR HFA 230-21 MCG/ACT inhaler TAKE 2 PUFFS INTO LUNGS TWICE A DAY (Patient taking differently: Inhale 2 puffs into the lungs 2 (two) times daily.)   albuterol (VENTOLIN HFA) 108 (90 Base) MCG/ACT inhaler INHALE 2 PUFFS INTO THE LUNGS EVERY 4 HOURS AS NEEDED  FOR WHEEZING ORSHORTNESS OF BREATH (Patient taking differently: Inhale 2 puffs into the lungs every 4 (four) hours as needed for wheezing or shortness of breath. INHALE 2 PUFFS INTO THE LUNGS EVERY 4 HOURS AS NEEDED FOR WHEEZING ORSHORTNESS OF BREATH)   aspirin 325 MG tablet Take 325 mg by mouth daily. Reported on 01/05/2016   atorvastatin (LIPITOR) 10 MG tablet TAKE 1 TABLET BY MOUTH ONCE DAILY.   cetirizine (ZYRTEC) 10 MG tablet TAKE ONE TABLET BY MOUTH EVERY DAY (Patient taking differently: Take 10 mg by mouth daily.)   cholecalciferol (VITAMIN D3) 25 MCG (1000 UT) tablet Take 1,000 Units by mouth daily.   clopidogrel (PLAVIX) 75 MG tablet TAKE 1 TABLET BY MOUTH DAILY   Crisaborole (EUCRISA) 2 % OINT Apply 1 application topically 2 (two) times daily.   diltiazem (TIADYLT ER) 360 MG 24 hr capsule Hold until followup with your outpatient doctor due to your blood pressure on the low side.   ezetimibe (ZETIA) 10 MG tablet TAKE 1 TABLET BY MOUTH DAILY   FLUoxetine (PROZAC) 20 MG capsule Take 1 capsule (20 mg total) by mouth daily. (Patient not taking: Reported on 07/06/2021)   glucose blood test strip ACCU-CHEK AVIVA PLUS TEST STRP Use to check sugars 3 times daily.   insulin glargine (LANTUS) 100 UNIT/ML injection Inject 0.05 mLs (5 Units total) into the skin at bedtime.   insulin lispro (HUMALOG) 100 UNIT/ML KiwkPen Inject 10 Units into the skin 3 (three) times daily. 10 units before breakfast, 8 before lunch and 18 before supper   iron polysaccharides (NIFEREX) 150 MG capsule Take 1 capsule (  150 mg total) by mouth daily.   isosorbide mononitrate (IMDUR) 30 MG 24 hr tablet Take 30 mg by mouth daily.   lansoprazole (PREVACID) 30 MG capsule TAKE 1 CAPSULE BY MOUTH ONCE DAILY AT NOON   losartan (COZAAR) 100 MG tablet TAKE 1 TABLET BY MOUTH DAILY   Magnesium 400 MG CAPS Take by mouth daily. (Patient not taking: Reported on 07/06/2021)   magnesium oxide (MAG-OX) 400 MG tablet Take 400 mg by mouth daily.    meloxicam (MOBIC) 7.5 MG tablet Take 1 tablet (7.5 mg total) by mouth daily as needed for pain.   memantine (NAMENDA) 5 MG tablet TAKE ONE TABLET TWICE DAILY   montelukast (SINGULAIR) 10 MG tablet TAKE ONE TABLET AT BEDTIME (Patient not taking: Reported on 07/06/2021)   ondansetron (ZOFRAN-ODT) 4 MG disintegrating tablet Take 4 mg by mouth every 8 (eight) hours as needed for nausea. For up to 12 doses.   pantoprazole (PROTONIX) 40 MG tablet Take 40 mg by mouth daily.   QUEtiapine (SEROQUEL) 25 MG tablet Take 1 tablet by mouth at bedtime.   tacrolimus (PROTOPIC) 0.1 % ointment Apply topically 2 (two) times daily.   traZODone (DESYREL) 50 MG tablet TAKE 1 AND 1/2 TABLET AT BEDTIME AS NEEDED FOR SLEEP (Patient not taking: Reported on 07/06/2021)   TUSSIN DM 100-10 MG/5ML liquid TAKE 1 TEASPOONFUL BY MOUTH EVERY 4 HOURS AS NEEDED FOR COUGH   vitamin B-12 1000 MCG tablet Take 1 tablet (1,000 mcg total) by mouth daily.   vitamin E 1000 UNIT capsule Take 1,000 Units by mouth daily.   No facility-administered encounter medications on file as of 11/05/2021.    Patient Active Problem List   Diagnosis Date Noted   Atherosclerosis of native arteries of the extremities with ulceration ([place]) 04/07/2021   PSVT (paroxysmal supraventricular tachycardia) ([place]) 62/95/2841   Acute metabolic encephalopathy 32/44/0102   Frequent falls 02/10/2021   COVID-19 virus infection 02/10/2021   Chronic kidney disease, stage 3a ([place]) 02/10/2021   Dementia ([place]) 02/10/2021   Garbled speech, intermittent 02/10/2021   Laceration without foreign body, left ankle, subsequent encounter 02/10/2021   AMS (altered mental status) 02/10/2021   Coronary artery disease involving native coronary artery of native heart 04/17/2020   Celiac artery stenosis ([place]) 06/12/2019   Bilateral lower abdominal cramping 04/20/2019   UTI symptoms 04/20/2019   History of rectal bleeding 03/26/2019   DM type 2 with diabetic peripheral neuropathy  ([place]) 12/14/2018   Arthropathy of lumbar facet joint 09/04/2018   Degeneration of lumbar intervertebral disc 09/04/2018   Spondylolisthesis, grade 1 09/04/2018   Lewy body dementia ([place]) 02/17/2018   PVD (peripheral vascular disease) ([place]) 08/16/2017   Pain in limb 07/27/2016   Hallucinations 07/07/2016   Chest pain 07/01/2016   Depression 06/23/2016   Hypokalemia 09/03/2015   Insomnia 07/23/2015   Headache 07/23/2015   Type 2 diabetes mellitus with vascular disease ([place]) 06/24/2015   Chronic vulvitis 06/02/2015   Allergic rhinitis 05/30/2015   Anemia 05/30/2015   Back ache 05/30/2015   Body mass index (BMI) of 29.0-29.9 in adult 05/30/2015   Edema 05/30/2015   Dilatation of esophagus 05/30/2015   Fall 05/30/2015   H/O deep venous thrombosis 05/30/2015   Hypercholesteremia 05/30/2015   Heart & renal disease, hypertensive, with heart failure ([place]) 05/30/2015   Hyponatremia 05/30/2015   Calculus of kidney 05/30/2015   Gonalgia 05/30/2015   Psoriasis 05/30/2015   Avitaminosis D 05/30/2015   Cough 04/01/2015   Combined fat and carbohydrate induced  hyperlipemia 12/30/2014   Paroxysmal atrial fibrillation (HCC) 07/10/2014   Abnormal gait 07/08/2014   Anxiety state 07/08/2014   Chronic kidney disease 07/08/2014   Acid reflux 07/08/2014   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[place]) 07/08/2014   Chronic obstructive pulmonary disease ([place]) 07/08/2014   Type II diabetes mellitus with renal manifestations ([place]) 07/08/2014   Benign essential HTN 06/26/2014   Carotid artery narrowing 06/26/2014   Myalgia 06/26/2014   Basal cell carcinoma of face 05/01/2014   Disease of female genital organs 11/28/2012   Incomplete bladder emptying 09/05/2012   Excessive urination at night 08/15/2012   Basal cell carcinoma of ear 10/26/2011    Patient Care Plan: RN Care Management Plan of Care     Problem Identified: DM, HTN, Fall Risk and Dementia      Long-Range Goal: Disease Progression Prevented  or Minimized   Start Date: 11/05/2021  Expected End Date: 02/03/2022  Priority: High  Note:   Current Barriers:  Chronic Disease Management support and education needs related to DMII, Fall Risk and Wellness.  RNCM Clinical Goal(s):  Patient will demonstrate Improved adherence to prescribed treatment plan for DMII,  Fall Risk and Wellness through collaboration with the provider, RN Care Manager and the care team.   Interventions: 1:1 collaboration with primary care provider regarding development and update of comprehensive plan of care as evidenced by provider attestation and co-signature Inter-disciplinary care team collaboration (see longitudinal plan of care) Evaluation of current treatment plan related to  self management and patient's adherence to plan as established by provider   Diabetes Interventions:   Discussed/Reviewed A1c goal: <7% Lab Results  Component Value Date   HGBA1C 7.6 (H) 02/08/2021  Reviewed plan for diabetes management. She reports taking medications as instructed. Denies recent missed doses. Family members are available to assist with medication management when needed. Reviewed s/sx of hypoglycemia and hyperglycemia and discussed recent Dexcom readings. Recalls two alarms d/t abnormal readings within the past week. Reports on elevated reading of 300 mg/dl after a meal. Notes a low fasting reading of 57 mg/dl. Reports immediately consuming a fig bar and levels returned to normal range. She feels that she is currently able to independently manage and take appropriate interventions. Reviewed nutritional intake. Reports significant improvements with appetite over the past month. Family members are assisting with meals and food preparation.  Discussed recent changes in skin integrity. She reports generalized itching d/t dry skin. Reports family members are assisting with assessing skin for wounds d/t scratching. Denies any open areas. She is using skiing cream as advised and  reports dry areas are clearing up. Discussed need to follow up if itching worsens or if she wounds are noted.   Falls Interventions:   Reviewed safety and fall prevention measures. Reports using assistive devices as recommended and notes significant improvement with ambulation. Denies recent falls. She is aware that risk remains high d/t impaired vision and balance. Advised to continue using cane or walker when ambulating. Advised to use caution when changing positions and avoid ambulating without appropriate footwear.  Discussed current functional status and ability to perform ADLs. Reports being able to complete ADLs independently. She remains independent in the home. Has strong family support. Confirmed that family is readily available to assist as needed. She is very eager to remain in her home as long as possible. The family has discussed transition to assistant living if her functional status declines.    Patient Goals/Self-Care Activities: Take all medications as prescribed Attend all scheduled provider appointments  Call pharmacy for medication refills 3-7 days in advance of running out of medications Perform all self care activities independently  Call provider office for new concerns or questions    Follow Up Plan:   Will follow up in three months.     PLAN: A member of the care management team will follow up within the next three months.   Cristy [facility] Care Management [facility] [phone]

## 2021-11-07 DIAGNOSIS — E1165 Type 2 diabetes mellitus with hyperglycemia: Secondary | ICD-10-CM | POA: Diagnosis not present

## 2021-11-07 DIAGNOSIS — Z794 Long term (current) use of insulin: Secondary | ICD-10-CM | POA: Diagnosis not present

## 2021-11-09 DIAGNOSIS — R2689 Other abnormalities of gait and mobility: Secondary | ICD-10-CM | POA: Diagnosis not present

## 2021-11-09 DIAGNOSIS — I480 Paroxysmal atrial fibrillation: Secondary | ICD-10-CM | POA: Diagnosis not present

## 2021-11-09 DIAGNOSIS — F32A Depression, unspecified: Secondary | ICD-10-CM | POA: Diagnosis not present

## 2021-11-09 DIAGNOSIS — F02818 Dementia in other diseases classified elsewhere, unspecified severity, with other behavioral disturbance: Secondary | ICD-10-CM | POA: Diagnosis not present

## 2021-11-24 DIAGNOSIS — E1121 Type 2 diabetes mellitus with diabetic nephropathy: Secondary | ICD-10-CM

## 2021-11-24 DIAGNOSIS — Z794 Long term (current) use of insulin: Secondary | ICD-10-CM

## 2021-12-07 ENCOUNTER — Other Ambulatory Visit: Payer: Self-pay | Admitting: Family Medicine

## 2021-12-08 NOTE — Telephone Encounter (Signed)
Requested medication (s) are due for refill today -yes  Requested medication (s) are on the active medication list -yes  Future visit scheduled -yes  Last refill: 07/25/21 #30 3RF  Notes to clinic: Request RF: fails lab protocol  Requested Prescriptions  Pending Prescriptions Disp Refills   clopidogrel (PLAVIX) 75 MG tablet [Pharmacy Med Name: CLOPIDOGREL BISULFATE 75 MG TAB] 30 tablet 3    Sig: TAKE 1 TABLET BY MOUTH DAILY     Hematology: Antiplatelets - clopidogrel Failed - 12/07/2021  4:09 PM      Failed - HCT in normal range and within 180 days    Hematocrit  Date Value Ref Range Status  05/08/2021 32.2 (L) 34.0 - 46.6 % Final          Failed - HGB in normal range and within 180 days    Hemoglobin  Date Value Ref Range Status  05/08/2021 9.6 (L) 11.1 - 15.9 g/dL Final          Failed - PLT in normal range and within 180 days    Platelets  Date Value Ref Range Status  05/08/2021 331 150 - 450 x10E3/uL Final          Passed - Cr in normal range and within 360 days    Creat  Date Value Ref Range Status  06/07/2017 1.19 (H) 0.60 - 0.88 mg/dL Final    Comment:    For patients >80 years of age, the reference limit for Creatinine is approximately 13% higher for people identified as African-American. .    Creatinine, Ser  Date Value Ref Range Status  05/08/2021 0.93 0.57 - 1.00 mg/dL Final          Passed - Valid encounter within last 6 months    Recent Outpatient Visits           4 months ago Moderate Lewy body dementia with anxiety [facility])   [facility] Maple Hudson., MD   5 months ago Late onset Alzheimer's dementia without behavioral disturbance [facility])   [facility] Maple Hudson., MD   7 months ago Benign essential HTN   [facility] Maple Hudson., MD   9 months ago Laceration without foreign body, left ankle, subsequent encounter   [facility] Chrismon, Jodell Cipro, [place]    10 months ago Benign essential HTN   [facility] Maple Hudson., MD       Future Appointments             In 1 month Maple Hudson., MD [facility]            Signed Prescriptions Disp Refills   lansoprazole (PREVACID) 30 MG capsule 90 capsule 1    Sig: TAKE 1 CAPSULE BY MOUTH ONCE DAILY AT NOON     Gastroenterology: Proton Pump Inhibitors 2 Passed - 12/07/2021  4:09 PM      Passed - ALT in normal range and within 360 days    ALT  Date Value Ref Range Status  05/08/2021 9 0 - 32 IU/L Final   SGPT (ALT)  Date Value Ref Range Status  02/06/2012 20 U/L Final    Comment:    12-78 NOTE: NEW REFERENCE RANGE 08/20/2011           Passed - AST in normal range and within 360 days    AST  Date Value Ref Range Status  05/08/2021 19 0 -  40 IU/L Final   SGOT(AST)  Date Value Ref Range Status  02/06/2012 22 15 - 37 Unit/L Final          Passed - Valid encounter within last 12 months    Recent Outpatient Visits           4 months ago Moderate Lewy body dementia with anxiety [facility])   [facility]   5 months ago Late onset Alzheimer's dementia without behavioral disturbance [facility])   [facility] Hudson[facility] MD   7 months ago Benign essential HTN   [facility] Maple Hudson., MD   9 months ago Laceration without foreign body, left ankle, subsequent encounter   [facility] Chrismon, Jodell Cipro, [place]   10 months ago Benign essential HTN   [facility] Maple Hudson., MD       Future Appointments             In 1 month Maple Hudson., MD [facility], [facility]            Refused Prescriptions Disp Refills   memantine (NAMENDA) 5 MG tablet [Pharmacy Med Name: MEMANTINE HCL 5 MG TAB] 60 tablet 3    Sig: TAKE ONE TABLET TWICE DAILY     Neurology:  Alzheimer's Agents 2  Passed - 12/07/2021  4:09 PM      Passed - Cr in normal range and within 360 days    Creat  Date Value Ref Range Status  06/07/2017 1.19 (H) 0.60 - 0.88 mg/dL Final    Comment:    For patients >15 years of age, the reference limit for Creatinine is approximately 13% higher for people identified as African-American. .    Creatinine, Ser  Date Value Ref Range Status  05/08/2021 0.93 0.57 - 1.00 mg/dL Final          Passed - eGFR is 5 or above and within 360 days    GFR, Est African American  Date Value Ref Range Status  06/07/2017 48 (L) > OR = 60 mL/min/1.40m2 Final   GFR calc Af Amer  Date Value Ref Range Status  11/18/2020 56 (L) >59 mL/min/1.73 Final    Comment:    **In accordance with recommendations from the NKF-ASN Task force,**   [facility] is in the process of updating its eGFR calculation to the   2021 CKD-EPI creatinine equation that estimates kidney function   without a race variable.    GFR, Est Non African American  Date Value Ref Range Status  06/07/2017 41 (L) > OR = 60 mL/min/1.64m2 Final   GFR, Estimated  Date Value Ref Range Status  04/03/2021 >60 >60 mL/min Final    Comment:    (NOTE) Calculated using the CKD-EPI Creatinine Equation (2021)    eGFR  Date Value Ref Range Status  05/08/2021 58 (L) >59 mL/min/1.73 Final          Passed - Valid encounter within last 6 months    Recent Outpatient Visits           4 months ago Moderate Lewy body dementia with anxiety [facility])   [facility] Maple Hudson., MD   5 months ago Late onset Alzheimer's dementia without behavioral disturbance [facility])   [facility] Maple Hudson., MD   7 months ago Benign essential HTN   [facility] Maple Hudson., MD  9 months ago Laceration without foreign body, left ankle, subsequent encounter   [facility] Chrismon, Jodell Cipro, [place]   10 months ago Benign essential HTN   [facility] Maple Hudson., MD       Future Appointments             In 1 month Maple Hudson., MD [facility]               Requested Prescriptions  Pending Prescriptions Disp Refills   clopidogrel (PLAVIX) 75 MG tablet [Pharmacy Med Name: CLOPIDOGREL BISULFATE 75 MG TAB] 30 tablet 3    Sig: TAKE 1 TABLET BY MOUTH DAILY     Hematology: Antiplatelets - clopidogrel Failed - 12/07/2021  4:09 PM      Failed - HCT in normal range and within 180 days    Hematocrit  Date Value Ref Range Status  05/08/2021 32.2 (L) 34.0 - 46.6 % Final          Failed - HGB in normal range and within 180 days    Hemoglobin  Date Value Ref Range Status  05/08/2021 9.6 (L) 11.1 - 15.9 g/dL Final          Failed - PLT in normal range and within 180 days    Platelets  Date Value Ref Range Status  05/08/2021 331 150 - 450 x10E3/uL Final          Passed - Cr in normal range and within 360 days    Creat  Date Value Ref Range Status  06/07/2017 1.19 (H) 0.60 - 0.88 mg/dL Final    Comment:    For patients >13 years of age, the reference limit for Creatinine is approximately 13% higher for people identified as African-American. .    Creatinine, Ser  Date Value Ref Range Status  05/08/2021 0.93 0.57 - 1.00 mg/dL Final          Passed - Valid encounter within last 6 months    Recent Outpatient Visits           4 months ago Moderate Lewy body dementia with anxiety [facility])   [facility] Maple Hudson., MD   5 months ago Late onset Alzheimer's dementia without behavioral disturbance [facility])   [facility] Maple Hudson., MD   7 months ago Benign essential HTN   [facility] Maple Hudson., MD   9 months ago Laceration without foreign body, left ankle, subsequent encounter   [facility] Chrismon, Jodell Cipro, [place]   10 months ago Benign essential HTN   [facility] Maple Hudson., MD       Future Appointments             In 1 month Maple Hudson., MD [facility]            Signed Prescriptions Disp Refills   lansoprazole (PREVACID) 30 MG capsule 90 capsule 1    Sig: TAKE 1 CAPSULE BY MOUTH ONCE DAILY AT NOON     Gastroenterology: Proton Pump Inhibitors 2 Passed - 12/07/2021  4:09 PM      Passed - ALT in normal range and within 360 days    ALT  Date Value Ref Range Status  05/08/2021 9 0 - 32 IU/L Final   SGPT (ALT)  Date Value Ref Range Status  02/06/2012 20 U/L Final    Comment:  12-78 NOTE: NEW REFERENCE RANGE 08/20/2011           Passed - AST in normal range and within 360 days    AST  Date Value Ref Range Status  05/08/2021 19 0 - 40 IU/L Final   SGOT(AST)  Date Value Ref Range Status  02/06/2012 22 15 - 37 Unit/L Final          Passed - Valid encounter within last 12 months    Recent Outpatient Visits           4 months ago Moderate Lewy body dementia with anxiety [facility])   [facility] Maple Hudson., MD   5 months ago Late onset Alzheimer's dementia without behavioral disturbance [facility])   [facility] Maple Hudson., MD   7 months ago Benign essential HTN   [facility] Maple Hudson., MD   9 months ago Laceration without foreign body, left ankle, subsequent encounter   [facility] Chrismon, Jodell Cipro, [place]   10 months ago Benign essential HTN   [facility] Maple Hudson., MD       Future Appointments             In 1 month Maple Hudson., MD [facility], [facility]            Refused Prescriptions Disp Refills   memantine (NAMENDA) 5 MG tablet [Pharmacy Med Name: MEMANTINE HCL 5 MG TAB] 60 tablet 3    Sig: TAKE ONE TABLET TWICE DAILY     Neurology:  Alzheimer's Agents 2 Passed - 12/07/2021  4:09 PM      Passed - Cr in normal range  and within 360 days    Creat  Date Value Ref Range Status  06/07/2017 1.19 (H) 0.60 - 0.88 mg/dL Final    Comment:    For patients >63 years of age, the reference limit for Creatinine is approximately 13% higher for people identified as African-American. .    Creatinine, Ser  Date Value Ref Range Status  05/08/2021 0.93 0.57 - 1.00 mg/dL Final          Passed - eGFR is 5 or above and within 360 days    GFR, Est African American  Date Value Ref Range Status  06/07/2017 48 (L) > OR = 60 mL/min/1.46m2 Final   GFR calc Af Amer  Date Value Ref Range Status  11/18/2020 56 (L) >59 mL/min/1.73 Final    Comment:    **In accordance with recommendations from the NKF-ASN Task force,**   [facility] is in the process of updating its eGFR calculation to the   2021 CKD-EPI creatinine equation that estimates kidney function   without a race variable.    GFR, Est Non African American  Date Value Ref Range Status  06/07/2017 41 (L) > OR = 60 mL/min/1.24m2 Final   GFR, Estimated  Date Value Ref Range Status  04/03/2021 >60 >60 mL/min Final    Comment:    (NOTE) Calculated using the CKD-EPI Creatinine Equation (2021)    eGFR  Date Value Ref Range Status  05/08/2021 58 (L) >59 mL/min/1.73 Final          Passed - Valid encounter within last 6 months    Recent Outpatient Visits           4 months ago Moderate Lewy body dementia with anxiety [facility])   [facility] Maple Hudson., MD  5 months ago Late onset Alzheimer's dementia without behavioral disturbance [facility])   [facility] Maple Hudson., MD   7 months ago Benign essential HTN   [facility] Maple Hudson., MD   9 months ago Laceration without foreign body, left ankle, subsequent encounter   [facility] Chrismon, Jodell Cipro, [place]   10 months ago Benign essential HTN   [facility] Maple Hudson., MD       Future  Appointments             In 1 month Maple Hudson., MD [facility], [facility]

## 2021-12-08 NOTE — Telephone Encounter (Signed)
Rx due fill- 09/15/21 #90 ?Rx early request- 10/13/21 360 3RF- too soon ?Requested Prescriptions  ?Pending Prescriptions Disp Refills  ?? lansoprazole (PREVACID) 30 MG capsule [Pharmacy Med Name: LANSOPRAZOLE 30 MG CAP] 90 capsule 1  ?  Sig: TAKE 1 CAPSULE BY MOUTH ONCE DAILY AT NOON  ?  ? Gastroenterology: Proton Pump Inhibitors 2 Passed - 12/07/2021  4:09 PM  ?  ?  Passed - ALT in normal range and within 360 days  ?  ALT  ?Date Value Ref Range Status  ?05/08/2021 9 0 - 32 IU/L Final  ? ?SGPT (ALT)  ?Date Value Ref Range Status  ?02/06/2012 20 U/L Final  ?  Comment:  ?  12-78 ?NOTE: NEW REFERENCE RANGE ?08/20/2011 ?  ?   ?  ?  Passed - AST in normal range and within 360 days  ?  AST  ?Date Value Ref Range Status  ?05/08/2021 19 0 - 40 IU/L Final  ? ?SGOT(AST)  ?Date Value Ref Range Status  ?02/06/2012 22 15 - 37 Unit/L Final  ?   ?  ?  Passed - Valid encounter within last 12 months  ?  Recent Outpatient Visits   ?      ? 4 months ago Moderate Lewy body dementia with anxiety ([place])  ? [facility] Jerrol Banana., MD  ? 5 months ago Late onset Alzheimer's dementia without behavioral disturbance [facility])  ? [facility] Jerrol Banana., MD  ? 7 months ago Benign essential HTN  ? [facility] Jerrol Banana., MD  ? 9 months ago Laceration without foreign body, left ankle, subsequent encounter  ? [place], PA-C  ? 10 months ago Benign essential HTN  ? [facility] Jerrol Banana., MD  ?  ?  ?Future Appointments   ?        ? In 1 month Jerrol Banana., MD [facility], [facility]  ?  ? ?  ?  ?  ?? clopidogrel (PLAVIX) 75 MG tablet [Pharmacy Med Name: CLOPIDOGREL BISULFATE 75 MG TAB] 30 tablet 3  ?  Sig: TAKE 1 TABLET BY MOUTH DAILY  ?  ? Hematology: Antiplatelets - clopidogrel Failed - 12/07/2021  4:09 PM  ?  ?  Failed - HCT in normal range and within 180 days  ?  Hematocrit  ?Date Value Ref  Range Status  ?05/08/2021 32.2 (L) 34.0 - 46.6 % Final  ?   ?  ?  Failed - HGB in normal range and within 180 days  ?  Hemoglobin  ?Date Value Ref Range Status  ?05/08/2021 9.6 (L) 11.1 - 15.9 g/dL Final  ?   ?  ?  Failed - PLT in normal range and within 180 days  ?  Platelets  ?Date Value Ref Range Status  ?05/08/2021 331 150 - 450 x10E3/uL Final  ?   ?  ?  Passed - Cr in normal range and within 360 days  ?  Creat  ?Date Value Ref Range Status  ?06/07/2017 1.19 (H) 0.60 - 0.88 mg/dL Final  ?  Comment:  ?  For patients >62 years of age, the reference limit ?for Creatinine is approximately 13% higher for people ?identified as African-American. ?. ?  ? ?Creatinine, Ser  ?Date Value Ref Range Status  ?05/08/2021 0.93 0.57 - 1.00 mg/dL Final  ?   ?  ?  Passed - Valid encounter within last 6  months  ?  Recent Outpatient Visits   ?      ? 4 months ago Moderate Lewy body dementia with anxiety ([place])  ? [facility] Jerrol Banana., MD  ? 5 months ago Late onset Alzheimer's dementia without behavioral disturbance [facility])  ? [facility] Jerrol Banana., [facility]  ? 7 months ago Benign essential HTN  ? [facility] Jerrol Banana., [facility]  ? 9 months ago Laceration without foreign body, left ankle, subsequent encounter  ? [place], PA-C  ? 10 months ago Benign essential HTN  ? [facility] Jerrol Banana., MD  ?  ?  ?Future Appointments   ?        ? In 1 month Jerrol Banana., MD [facility], [facility]  ?  ? ?  ?  ?  ?Refused Prescriptions Disp Refills  ?? memantine (NAMENDA) 5 MG tablet [Pharmacy Med Name: MEMANTINE HCL 5 MG TAB] 60 tablet 3  ?  Sig: TAKE ONE TABLET TWICE DAILY  ?  ? Neurology:  Alzheimer's Agents 2 Passed - 12/07/2021  4:09 PM  ?  ?  Passed - Cr in normal range and within 360 days  ?  Creat  ?Date Value Ref Range Status  ?06/07/2017 1.19 (H) 0.60 - 0.88 mg/dL Final  ?  Comment:  ?  For  patients >31 years of age, the reference limit ?for Creatinine is approximately 13% higher for people ?identified as African-American. ?. ?  ? ?Creatinine, Ser  ?Date Value Ref Range Status  ?05/08/2021 0.93 0.57 - 1.00 mg/dL Final  ?   ?  ?  Passed - eGFR is 5 or above and within 360 days  ?  GFR, Est African American  ?Date Value Ref Range Status  ?06/07/2017 48 (L) > OR = 60 mL/min/1.84m Final  ? ?GFR calc Af Amer  ?Date Value Ref Range Status  ?11/18/2020 56 (L) >59 mL/min/1.73 Final  ?  Comment:  ?  **In accordance with recommendations from the NKF-ASN Task force,** ?  [facility] is in the process of updating its eGFR calculation to the ?  2021 CKD-EPI creatinine equation that estimates kidney function ?  without a race variable. ?  ? ?GFR, Est Non African American  ?Date Value Ref Range Status  ?06/07/2017 41 (L) > OR = 60 mL/min/1.753mFinal  ? ?GFR, Estimated  ?Date Value Ref Range Status  ?04/03/2021 >60 >60 mL/min Final  ?  Comment:  ?  (NOTE) ?Calculated using the CKD-EPI Creatinine Equation (2021) ?  ? ?eGFR  ?Date Value Ref Range Status  ?05/08/2021 58 (L) >59 mL/min/1.73 Final  ?   ?  ?  Passed - Valid encounter within last 6 months  ?  Recent Outpatient Visits   ?      ? 4 months ago Moderate [place] body dementia with [place] [facility]  ? 5 months ago [facility]  ? 7 months ago Benign essential HTN  ? [facility]  ? 9 months ago Laceration without foreign body, left ankle, subsequent encounter  ? [facility]  ? 10 months ago Benign essential HTN  ? [facility]  ?  ?  ?  Future Appointments   ?        ? In 1 month Jerrol Banana., MD [facility]  ?  ? ?  ?  ?  ? ?

## 2021-12-28 ENCOUNTER — Other Ambulatory Visit: Payer: Self-pay

## 2021-12-28 ENCOUNTER — Ambulatory Visit: Payer: Self-pay | Admitting: *Deleted

## 2021-12-28 ENCOUNTER — Emergency Department
Admission: EM | Admit: 2021-12-28 | Discharge: 2021-12-29 | Disposition: A | Discharge status: Home / Self Care | Payer: Medicare Other | Attending: Emergency Medicine | Admitting: Emergency Medicine

## 2021-12-28 DIAGNOSIS — F411 Generalized anxiety disorder: Secondary | ICD-10-CM | POA: Diagnosis present

## 2021-12-28 DIAGNOSIS — F0280 Dementia in other diseases classified elsewhere without behavioral disturbance: Secondary | ICD-10-CM | POA: Diagnosis not present

## 2021-12-28 DIAGNOSIS — Z79899 Other long term (current) drug therapy: Secondary | ICD-10-CM | POA: Diagnosis not present

## 2021-12-28 DIAGNOSIS — R451 Restlessness and agitation: Secondary | ICD-10-CM | POA: Diagnosis not present

## 2021-12-28 DIAGNOSIS — Z743 Need for continuous supervision: Secondary | ICD-10-CM | POA: Diagnosis not present

## 2021-12-28 DIAGNOSIS — F03911 Unspecified dementia, unspecified severity, with agitation: Secondary | ICD-10-CM | POA: Diagnosis not present

## 2021-12-28 DIAGNOSIS — Z7982 Long term (current) use of aspirin: Secondary | ICD-10-CM | POA: Diagnosis not present

## 2021-12-28 DIAGNOSIS — Z7902 Long term (current) use of antithrombotics/antiplatelets: Secondary | ICD-10-CM | POA: Insufficient documentation

## 2021-12-28 DIAGNOSIS — G3183 Dementia with Lewy bodies: Secondary | ICD-10-CM

## 2021-12-28 DIAGNOSIS — R4182 Altered mental status, unspecified: Secondary | ICD-10-CM | POA: Diagnosis present

## 2021-12-28 DIAGNOSIS — R6889 Other general symptoms and signs: Secondary | ICD-10-CM | POA: Diagnosis not present

## 2021-12-28 DIAGNOSIS — Z20822 Contact with and (suspected) exposure to covid-19: Secondary | ICD-10-CM | POA: Insufficient documentation

## 2021-12-28 DIAGNOSIS — J441 Chronic obstructive pulmonary disease with (acute) exacerbation: Secondary | ICD-10-CM | POA: Diagnosis present

## 2021-12-28 DIAGNOSIS — R441 Visual hallucinations: Secondary | ICD-10-CM | POA: Diagnosis present

## 2021-12-28 DIAGNOSIS — N189 Chronic kidney disease, unspecified: Secondary | ICD-10-CM | POA: Diagnosis present

## 2021-12-28 DIAGNOSIS — W19XXXA Unspecified fall, initial encounter: Secondary | ICD-10-CM | POA: Diagnosis present

## 2021-12-28 DIAGNOSIS — R443 Hallucinations, unspecified: Secondary | ICD-10-CM | POA: Diagnosis not present

## 2021-12-28 DIAGNOSIS — I4891 Unspecified atrial fibrillation: Secondary | ICD-10-CM | POA: Diagnosis not present

## 2021-12-28 DIAGNOSIS — J449 Chronic obstructive pulmonary disease, unspecified: Secondary | ICD-10-CM | POA: Diagnosis present

## 2021-12-28 DIAGNOSIS — R269 Unspecified abnormalities of gait and mobility: Secondary | ICD-10-CM

## 2021-12-28 DIAGNOSIS — E1129 Type 2 diabetes mellitus with other diabetic kidney complication: Secondary | ICD-10-CM | POA: Diagnosis present

## 2021-12-28 DIAGNOSIS — G4489 Other headache syndrome: Secondary | ICD-10-CM | POA: Diagnosis not present

## 2021-12-28 DIAGNOSIS — F0390 Unspecified dementia without behavioral disturbance: Secondary | ICD-10-CM | POA: Diagnosis present

## 2021-12-28 DIAGNOSIS — F32A Depression, unspecified: Secondary | ICD-10-CM | POA: Diagnosis present

## 2021-12-28 LAB — URINALYSIS, ROUTINE W REFLEX MICROSCOPIC
Bacteria, UA: NONE SEEN
Bilirubin Urine: NEGATIVE
Glucose, UA: >=500 mg/dL — AB
Hgb urine dipstick: NEGATIVE
Ketones, ur: NEGATIVE mg/dL
Leukocytes,Ua: NEGATIVE
Nitrite: NEGATIVE
Protein, ur: 30 mg/dL — AB
Specific Gravity, Urine: 1.016 (ref 1.005–1.030)
pH: 6 (ref 5.0–8.0)

## 2021-12-28 LAB — SALICYLATE LEVEL: Salicylate Lvl: <7 mg/dL — ABNORMAL LOW (ref 7.0–30.0)

## 2021-12-28 LAB — URINE DRUG SCREEN, QUALITATIVE (ARMC ONLY)
Amphetamines, Ur Screen: NOT DETECTED
Barbiturates, Ur Screen: NOT DETECTED
Benzodiazepine, Ur Scrn: NOT DETECTED
Cannabinoid 50 Ng, Ur ~~LOC~~: NOT DETECTED
Cocaine Metabolite,Ur ~~LOC~~: NOT DETECTED
MDMA (Ecstasy)Ur Screen: NOT DETECTED
Methadone Scn, Ur: NOT DETECTED
Opiate, Ur Screen: NOT DETECTED
Phencyclidine (PCP) Ur S: NOT DETECTED
Tricyclic, Ur Screen: NOT DETECTED

## 2021-12-28 LAB — CBC
HCT: 36.7 % (ref 36.0–46.0)
Hemoglobin: 11.4 g/dL — ABNORMAL LOW (ref 12.0–15.0)
MCH: 23.4 pg — ABNORMAL LOW (ref 26.0–34.0)
MCHC: 31.1 g/dL (ref 30.0–36.0)
MCV: 75.2 fL — ABNORMAL LOW (ref 80.0–100.0)
Platelets: 331 10*3/uL (ref 150–400)
RBC: 4.88 MIL/uL (ref 3.87–5.11)
RDW: 18.6 % — ABNORMAL HIGH (ref 11.5–15.5)
WBC: 10.5 10*3/uL (ref 4.0–10.5)
nRBC: 0 % (ref 0.0–0.2)

## 2021-12-28 LAB — COMPREHENSIVE METABOLIC PANEL
ALT: 16 U/L (ref 0–44)
AST: 19 U/L (ref 15–41)
Albumin: 3.9 g/dL (ref 3.5–5.0)
Alkaline Phosphatase: 128 U/L — ABNORMAL HIGH (ref 38–126)
Anion gap: 7 (ref 5–15)
BUN: 20 mg/dL (ref 8–23)
CO2: 28 mmol/L (ref 22–32)
Calcium: 8.9 mg/dL (ref 8.9–10.3)
Chloride: 98 mmol/L (ref 98–111)
Creatinine, Ser: 0.84 mg/dL (ref 0.44–1.00)
GFR, Estimated: >60 mL/min (ref >60)
Glucose, Bld: 231 mg/dL — ABNORMAL HIGH (ref 70–99)
Potassium: 4.1 mmol/L (ref 3.5–5.1)
Sodium: 133 mmol/L — ABNORMAL LOW (ref 135–145)
Total Bilirubin: 0.9 mg/dL (ref 0.3–1.2)
Total Protein: 7.3 g/dL (ref 6.5–8.1)

## 2021-12-28 LAB — ACETAMINOPHEN LEVEL: Acetaminophen (Tylenol), Serum: <10 ug/mL — ABNORMAL LOW (ref 10–30)

## 2021-12-28 LAB — RESP PANEL BY RT-PCR (FLU A&B, COVID) ARPGX2
Influenza A by PCR: NEGATIVE
Influenza B by PCR: NEGATIVE
SARS Coronavirus 2 by RT PCR: NEGATIVE

## 2021-12-28 LAB — ETHANOL: Alcohol, Ethyl (B): <10 mg/dL (ref <10)

## 2021-12-28 MED ORDER — QUETIAPINE FUMARATE 25 MG PO TABS
50.0000 mg | ORAL_TABLET | Freq: Every day | ORAL | Status: DC
  Administered 2021-12-28: 50 mg via ORAL
  Filled 2021-12-28: qty 2

## 2021-12-28 MED ORDER — INSULIN ASPART 100 UNIT/ML IJ SOLN
0.0000 [IU] | Freq: Three times a day (TID) | INTRAMUSCULAR | Status: DC
  Administered 2021-12-29: 5 [IU] via SUBCUTANEOUS
  Filled 2021-12-28 (×2): qty 1

## 2021-12-28 MED ORDER — MEMANTINE HCL 5 MG PO TABS
5.0000 mg | ORAL_TABLET | Freq: Two times a day (BID) | ORAL | Status: DC
  Administered 2021-12-28 – 2021-12-29 (×2): 5 mg via ORAL
  Filled 2021-12-28 (×2): qty 1

## 2021-12-28 MED ORDER — INSULIN GLARGINE-YFGN 100 UNIT/ML ~~LOC~~ SOLN
5.0000 [IU] | Freq: Every day | SUBCUTANEOUS | Status: DC
Start: 2021-12-28 — End: 2021-12-29
  Filled 2021-12-28 (×2): qty 0.05

## 2021-12-28 MED ORDER — CLOPIDOGREL BISULFATE 75 MG PO TABS
75.0000 mg | ORAL_TABLET | Freq: Every day | ORAL | Status: DC
  Administered 2021-12-29: 75 mg via ORAL
  Filled 2021-12-28 (×2): qty 1

## 2021-12-28 MED ORDER — EZETIMIBE 10 MG PO TABS
10.0000 mg | ORAL_TABLET | Freq: Every day | ORAL | Status: DC
  Administered 2021-12-29: 10 mg via ORAL
  Filled 2021-12-28 (×2): qty 1

## 2021-12-28 MED ORDER — ISOSORBIDE MONONITRATE ER 60 MG PO TB24
30.0000 mg | ORAL_TABLET | Freq: Every day | ORAL | Status: DC
  Administered 2021-12-29: 30 mg via ORAL
  Filled 2021-12-28 (×2): qty 1

## 2021-12-28 MED ORDER — ASPIRIN 325 MG PO TABS
325.0000 mg | ORAL_TABLET | Freq: Every day | ORAL | Status: DC
  Filled 2021-12-28: qty 1

## 2021-12-28 MED ORDER — MAGNESIUM OXIDE 400 MG PO TABS
400.0000 mg | ORAL_TABLET | Freq: Every day | ORAL | Status: DC
  Administered 2021-12-29: 400 mg via ORAL
  Filled 2021-12-28 (×3): qty 1

## 2021-12-28 MED ORDER — ATORVASTATIN CALCIUM 20 MG PO TABS
10.0000 mg | ORAL_TABLET | Freq: Every day | ORAL | Status: DC
  Administered 2021-12-28 – 2021-12-29 (×2): 10 mg via ORAL
  Filled 2021-12-28 (×3): qty 1

## 2021-12-28 MED ORDER — LOSARTAN POTASSIUM 50 MG PO TABS
100.0000 mg | ORAL_TABLET | Freq: Every day | ORAL | Status: DC
Start: 2021-12-28 — End: 2021-12-29
  Administered 2021-12-28 – 2021-12-29 (×2): 100 mg via ORAL
  Filled 2021-12-28 (×3): qty 2

## 2021-12-28 NOTE — Telephone Encounter (Signed)
?  Chief Complaint: Hallucinations ?Symptoms: "Seeing people, bugs crawling on her, yelling out, crying, states "I don't want to live anymore." Has not slept, has not eaten today,scratching a lot. ?Frequency: H/O,, worsening, worse today ?Pertinent Negatives: Patient denies  ?Disposition: '[x]'$ ED /'[]'$ Urgent Care (no appt availability in office) / '[]'$ Appointment(In office/virtual)/ '[]'$  [place] [facility]/ '[]'$ Home Care/ '[]'$ Refused Recommended Disposition /'[]'$ [place] [facility]/ '[]'$  Follow-up with PCP ?Additional Notes: Advised ED, can hear pt yelling out in back ground. States will follow disposition but wants to speak to siblings first. Reiterated need for ED eval. ?

## 2021-12-28 NOTE — ED Notes (Signed)
Pt states IV is painful. ?

## 2021-12-28 NOTE — ED Notes (Signed)
Snack and water given. Pt consumed water only. ?

## 2021-12-28 NOTE — ED Notes (Signed)
Pt has been scratching all over her body from being nervous. Pt has scratches all over and dried blood. ?

## 2021-12-28 NOTE — ED Triage Notes (Addendum)
Pt comes via [facility] from home with c/o hallucinations. Pt states she was at home last night and a huge group of people came into her house. Daugther took pt to her house and the people followed her per pt.  ? 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 ? ?Pt has not slept in two night. Pt was tearful with family. Pt denies any since being here at the hospital ? ?Pt states SI and HI thoughts today. Pt states she has had 7 different groups to come to her home and she can't deal with it anymore.  ?

## 2021-12-28 NOTE — ED Notes (Signed)
Patient given meal tray.

## 2021-12-28 NOTE — ED Provider Notes (Signed)
? ?[facility] ?Provider Note ? ? ? Event Date/Time  ? First MD Initiated Contact with Patient 12/28/21 1612   ?  (approximate) ? ? ?History  ? ?Hallucinations ? ? ?HPI ? ?[name] is a 88 y.o. female with Lewy body dementia, A-fib on aspirin and Plavix who comes in from home due to concerns for hallucinations have gotten unbearable the past 2 to 3 days.  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 ? ?Patient reports having lots of people coming into her home.  She denies any thoughts of HI except for the hallucinations that she is seeing.  She denies any SI.  Denies any other medical concerns.  Denies any falls, hitting her head ? ?I reviewed patient's neurology note from 11/09/2021 for patient has notably by dementia with visual hallucinations and issues with sleep.  Patient was on Seroquel 50 mg mouth every night as well as in the Namenda '5mg'$  BID.  She had a CT head in October 2022 that was otherwise reassuring. ? ? ? ? ?Physical Exam  ? ?Triage Vital Signs: ?ED Triage Vitals  ?Enc Vitals Group  ?   BP 12/28/21 1523 (!) 174/100  ?   Pulse Rate 12/28/21 1523 (!) 106  ?   Resp 12/28/21 1523 18  ?   Temp 12/28/21 1523 98 ?F (36.7 ?C)  ?   Temp src --   ?   SpO2 12/28/21 1523 99 %  ?   Weight --   ?   Height --   ?   Head Circumference --   ?   Peak Flow --   ?   Pain Score 12/28/21 1520 0  ?   Pain Loc --   ?   Pain Edu? --   ?   Excl. in [place]? --   ? ? ?Most recent vital signs: ?Vitals:  ? 12/28/21 1523  ?BP: (!) 174/100  ?Pulse: (!) 106  ?Resp: 18  ?Temp: 98 ?F (36.7 ?C)  ?SpO2: 99%  ? ? ? ?General: Awake, no distress.  ?CV:  Good peripheral perfusion.  ?Resp:  Normal effort.  ?Abd:  No distention.  ?Other:  Moving all extremities well.  No obvious deficits.  Patient does report some hallucinations.  Denies any SI ?No evidence of trauma on head ? ?ED Results / Procedures / Treatments  ? ?Labs ?(all labs ordered are listed, but only abnormal results are  displayed) ?Labs Reviewed  ?COMPREHENSIVE METABOLIC PANEL - Abnormal; Notable for the following components:  ?    Result Value  ? Sodium 133 (*)   ? Glucose, Bld 231 (*)   ? Alkaline Phosphatase 128 (*)   ? All other components within normal limits  ?SALICYLATE LEVEL - Abnormal; Notable for the following components:  ? Salicylate Lvl <5.8 (*)   ? All other components within normal limits  ?ACETAMINOPHEN LEVEL - Abnormal; Notable for the following components:  ? Acetaminophen (Tylenol), Serum <10 (*)   ? All other components within normal limits  ?CBC - Abnormal; Notable for the following components:  ? Hemoglobin 11.4 (*)   ? MCV 75.2 (*)   ? MCH 23.4 (*)   ? RDW 18.6 (*)   ? All other components within normal limits  ?ETHANOL  ?URINE DRUG SCREEN, QUALITATIVE ([facility] ONLY)  ? ?PROCEDURES: ? ?Critical Care performed: No ? ?Procedures ? ? ?MEDICATIONS ORDERED IN ED: ?Medications - No data to display ? ? ?IMPRESSION / MDM / ASSESSMENT AND  PLAN / ED COURSE  ?I reviewed the triage vital signs and the nursing notes. ?             ?               ? ?Differential diagnosis includes, but is not limited to, suspect this is most likely secondary to her known Lewy body dementia.  No evidence of trauma on her head and she denies any falls.  Labs ordered evaluate for any Electra abnormalities, AKI ? ?CBC shows stable hemoglobin.  No white count elevation to suggest an infection.  Labs show slightly elevated glucose at 231 slightly low sodium.  Alcohol is negative.  Tylenol, salicylate are negative] ? ?6:22 PM  ?Discussed with the POA who states that she normally does really well and has these hallucinations but today they were making her hysterical and that she was afraid of them which is atypical for her.  He says that he lives in [place] but the sisters near live nearby and they help her.  He thinks that she would be able to go home if she is not having this outbreak again. ? ?8:00 PM discussed with Daughter [name] ?Chasing the  people through the house. She wanted to die and she was going to kill herself and she wanted to kill herself. She scratches herself due to this.  This was an acute change from her prior hallucinations but even when she took her over to her house she was still doing it.  Patient lives by herself and they have tried to get her into a facility but she refused to go and that she knows that she is having the hallucinations she is otherwise with but these hallucinations were more distressing today which is why she wanted her to be evaluated. ? ?Please call [name] tomorrow to update her on the situation ? ?Given this patient is now cleared for consultation with psychiatry and further disposition ? ? ?Based on HPI, exam, unremarkable labs, no concern for acute medical problem at this time. No rigidity, clonus, hyperthermia, focal neurologic deficit, diaphoresis, tachycardia, meningismus, ataxia, gait abnormality or other finding to suggest this visit represents a non-psychiatric problem. Screening labs reviewed.   ? ?Given this, pt medically cleared, to be dispositioned per Psych. ? ? ? ?The patient has been placed in psychiatric observation due to the need to provide a safe environment for the patient while obtaining psychiatric consultation and evaluation, as well as ongoing medical and medication management to treat the patient's condition.  The patient has not been placed under full IVC at this time.  ? ? ? ? ? ?FINAL CLINICAL IMPRESSION(S) / ED DIAGNOSES  ? ?Final diagnoses:  ?Lewy body dementia with agitation, unspecified dementia severity ([place])  ? ? ? ?Rx / DC Orders  ? ?ED Discharge Orders   ? ? None  ? ?  ? ? ? ?Note:  This document was prepared using Dragon voice recognition software and may include unintentional dictation errors. ?  ?Vanessa Enon, MD ?12/28/21 2027 ? ?

## 2021-12-28 NOTE — ED Notes (Addendum)
Pt dressed out into hospital attire. Pt's belongings to include: ? 1 pink shirt ? 1 blue pants ?1 white underwear ?2 gray shoes ?1 black watch ? ?All belongings taken home by daughter. ?

## 2021-12-28 NOTE — ED Triage Notes (Signed)
FIRST NURSE NOTE:  ?Pt via EMS from home. Pt c/o hallucinations that has gotten unbearable the past 2-3 days. States that it has she has a hx of hallucinogenic dementia and at her baseline at this time. Pt also endorses a headache. ? ?98.7  ?298 CBG ?170/100 ?97% on RA  ?

## 2021-12-28 NOTE — Telephone Encounter (Signed)
Reason for Disposition ?? Seeing, hearing, or feeling things that are not there (i.e., visual, auditory, or tactile hallucinations) ? ?Answer Assessment - Initial Assessment Questions ?1. LEVEL OF CONSCIOUSNESS: "How is he (she, the patient) acting right now?" (e.g., alert-oriented, confused, lethargic, stuporous, comatose) ?     ?2. ONSET: "When did the confusion start?"  (minutes, hours, days) ?    H/O, worsening weeks and months ?3. PATTERN "Does this come and go, or has it been constant since it started?"  "Is it present now?" ?     ?4. ALCOHOL or DRUGS: "Has he been drinking alcohol or taking any drugs?"  ?     ?5. NARCOTIC MEDICATIONS: "Has he been receiving any narcotic medications?" (e.g., morphine, Vicodin) ?     ?6. CAUSE: "What do you think is causing the confusion?"  ?     ?7. OTHER SYMPTOMS: "Are there any other symptoms?" (e.g., difficulty breathing, headache, fever, weakness) ?    Scratching a lot, not sleeping, not eating today. ? ?Protocols used: Confusion - Delirium-A-AH ? ?

## 2021-12-29 DIAGNOSIS — R443 Hallucinations, unspecified: Secondary | ICD-10-CM

## 2021-12-29 DIAGNOSIS — F0280 Dementia in other diseases classified elsewhere without behavioral disturbance: Secondary | ICD-10-CM

## 2021-12-29 DIAGNOSIS — G3183 Dementia with Lewy bodies: Secondary | ICD-10-CM | POA: Diagnosis not present

## 2021-12-29 LAB — CBG MONITORING, ED
Glucose-Capillary: 220 mg/dL — ABNORMAL HIGH (ref 70–99)
Glucose-Capillary: 166 mg/dL — ABNORMAL HIGH (ref 70–99)

## 2021-12-29 NOTE — Consult Note (Signed)
[facility] Psych ED Discharge ? ?12/29/2021 7:42 PM ?[name]  ?MRN:  [id number] ? ?Method of visit?: Face to Face  ? ?Principal Problem: Lewy body dementia ([place]) ?Discharge Diagnoses: Principal Problem: ?  Lewy body dementia ([place]) ?Active Problems: ?  Abnormal gait ?  Anxiety state ?  Chronic kidney disease ?  Fall ?  Chronic obstructive pulmonary disease (HCC) ?  Depression ?  Type II diabetes mellitus with renal manifestations ([place]) ?  Dementia ([place]) ?  AMS (altered mental status) ? ? ?Subjective: Patient states "I want to be able to stay in my home home."  Patient was seen for reassessment next morning.  Patient has a chronic condition that inpatient psychiatric hospitalization would be of little benefit.  Writer discussed this with geriatric psychiatrist, Dr. Louis Meckel. ? ?On evaluation: Patient was evaluated around noon today. She was sitting in a chair with her daughter at beside her.  Patient was alert and oriented x3.  She very pleasant and calm.  Is able to articulate coherent answers to questions.  Denies any current auditory or visual hallucinations.  Does not endorse suicidal thoughts and denies ever having suicidal thoughts.  She states that there have been many "people" coming in her house.  She believes that they are delusions only because people tell her they are.  But she would like that to stop.  Daughter states that it is her brother and his wife to take her to different doctors that she is not aware of exactly what the neurologist has said.  However, patient states that the neurologist has told her that part of her brain has gotten smaller.  She says that she will see the neurologist either this week or next week and is due for another MRI of brain.  Writer strongly encouraged patient and her daughter that she tell the neurologist about this and that there are medications that can help these hallucinations.  Patient has several medication allergies and is on medications for various diagnoses.   It is most prudent to have her neurologist prescribed something.  Discussed that it is not uncommon with patients dementia diagnosis that she can have hallucinations, but again speak to the neurologist about the best course of treatment.  Patient is quite adamant that she wants to remain in her house.  She lives alone.  Her daughter states that her niece will probably end up moving in with patient to help care for her.  Both daughter and patient are amenable to following up with outpatient neurologist.  Patient's primary care physician is not comfortable prescribing anything for the hallucinations, according to patient. ? ? ? ?Total Time spent with patient: 30 minutes ? ?Past Psychiatric History: Denies ? ?Past Medical History:  ?Past Medical History:  ?Diagnosis Date  ? Angina pectoris ([place])   ? Atrial fibrillation ([place])   ? Basal cell carcinoma   ? nose  ? Cervicalgia   ? Chronic back pain   ? Chronic obstructive pulmonary disease (COPD) ([place])   ? COPD (chronic obstructive pulmonary disease) ([place])   ? DDD (degenerative disc disease), lumbar   ? Diabetes mellitus without complication ([place])   ? Dysphagia   ? Hyperlipemia   ? Hypertension   ? Squamous cell carcinoma of skin   ? left lateral pretibial  ? Vulvovaginitis   ?  ?Past Surgical History:  ?Procedure Laterality Date  ? gall bladder    ? melanoma    ? removal neck and back  ? PERIPHERAL VASCULAR CATHETERIZATION  Left 01/05/2016  ? Procedure: Lower Extremity Angiography;  Surgeon: Algernon Huxley, MD;  Location: [place] [facility];  Service: Cardiovascular;  Laterality: Left;  ? PERIPHERAL VASCULAR CATHETERIZATION  01/05/2016  ? Procedure: Lower Extremity Intervention;  Surgeon: Algernon Huxley, MD;  Location: [place] [facility];  Service: Cardiovascular;;  ? ureterolithiasis    ? calculus removed  ? VAGINAL HYSTERECTOMY    ? VISCERAL ANGIOGRAPHY N/A 05/10/2019  ? Procedure: VISCERAL ANGIOGRAPHY;  Surgeon: Algernon Huxley, MD;  Location: [place] [facility];  Service:  Cardiovascular;  Laterality: N/A;  ? VULVA / PERINEUM BIOPSY  05/29/2015  ? ?Family History:  ?Family History  ?Problem Relation Age of [place]  ? Ovarian cancer Other   ? Diabetes Sister   ? Colon cancer Brother   ? Diabetes Brother   ? Atrial fibrillation Daughter   ? Breast cancer Neg Hx   ? ?Family Psychiatric  History: None reported, husband had dementia ?Social History:  ?Social History  ? ?Substance and Sexual Activity  ?Alcohol Use No  ?   ?Social History  ? ?Substance and Sexual Activity  ?Drug Use No  ?  ?Social History  ? ?Socioeconomic History  ? Marital status: Widowed  ?  Spouse name: Not on file  ? Number of children: 3  ? Years of education: Not on file  ? Highest education level: 8th grade  ?Occupational History  ? Occupation: retired  ?Tobacco Use  ? Smoking status: Never  ? Smokeless tobacco: Never  ?Vaping Use  ? Vaping Use: Never used  ?Substance and Sexual Activity  ? Alcohol use: No  ? Drug use: No  ? Sexual activity: Never  ?Other Topics Concern  ? Not on file  ?Social History Narrative  ? Not on file  ? ?Social Determinants of Health  ? ?Financial Resource Strain: Not on file  ?Food Insecurity: Not on file  ?Transportation Needs: Not on file  ?Physical Activity: Not on file  ?Stress: Not on file  ?Social Connections: Not on file  ? ? ?Tobacco Cessation:  N/A, patient does not currently use tobacco products ? ?Current Medications: ?No current facility-administered medications for this encounter.  ? ?Current Outpatient Medications  ?Medication Sig Dispense Refill  ? aspirin 325 MG tablet Take 325 mg by mouth daily. Reported on 01/05/2016    ? atorvastatin (LIPITOR) 10 MG tablet TAKE 1 TABLET BY MOUTH ONCE DAILY. 90 tablet 1  ? cetirizine (ZYRTEC) 10 MG tablet TAKE ONE TABLET BY MOUTH EVERY DAY 90 tablet 3  ? cholecalciferol (VITAMIN D3) 25 MCG (1000 UT) tablet Take 1,000 Units by mouth daily.    ? clopidogrel (PLAVIX) 75 MG tablet TAKE 1 TABLET BY MOUTH DAILY 30 tablet 3  ? ezetimibe (ZETIA) 10 MG  tablet TAKE 1 TABLET BY MOUTH DAILY 90 tablet 1  ? insulin glargine (LANTUS) 100 UNIT/ML injection Inject 0.05 mLs (5 Units total) into the skin at bedtime. 10 mL 11  ? insulin lispro (HUMALOG) 100 UNIT/ML KiwkPen Inject 10 Units into the skin 3 (three) times daily. 10 units before breakfast, 8 before lunch and 18 before supper    ? isosorbide mononitrate (IMDUR) 30 MG 24 hr tablet Take 30 mg by mouth daily.    ? lansoprazole (PREVACID) 30 MG capsule TAKE 1 CAPSULE BY MOUTH ONCE DAILY AT NOON 90 capsule 1  ? losartan (COZAAR) 100 MG tablet TAKE 1 TABLET BY MOUTH DAILY 90 tablet 1  ? magnesium oxide (MAG-OX) 400 MG tablet  Take 400 mg by mouth daily.    ? memantine (NAMENDA) 5 MG tablet TAKE ONE TABLET TWICE DAILY 60 tablet 3  ? QUEtiapine (SEROQUEL) 50 MG tablet Take 50 mg by mouth at bedtime.    ? vitamin B-12 1000 MCG tablet Take 1 tablet (1,000 mcg total) by mouth daily. 14 tablet 0  ? vitamin E 1000 UNIT capsule Take 1,000 Units by mouth daily.    ? ADVAIR HFA 230-21 MCG/ACT inhaler TAKE 2 PUFFS INTO LUNGS TWICE A DAY (Patient not taking: Reported on 12/28/2021) 12 g 6  ? albuterol (VENTOLIN HFA) 108 (90 Base) MCG/ACT inhaler INHALE 2 PUFFS INTO THE LUNGS EVERY 4 HOURS AS NEEDED FOR WHEEZING ORSHORTNESS OF BREATH (Patient not taking: Reported on 12/28/2021) 8.5 g 10  ? Crisaborole (EUCRISA) 2 % OINT Apply 1 application topically 2 (two) times daily. (Patient not taking: Reported on 12/28/2021) 200 g 2  ? diltiazem (TIADYLT ER) 360 MG 24 hr capsule Hold until followup with your outpatient doctor due to your blood pressure on the low side. (Patient not taking: Reported on 12/28/2021) 30 capsule 3  ? FLUoxetine (PROZAC) 20 MG capsule Take 1 capsule (20 mg total) by mouth daily. (Patient not taking: Reported on 07/06/2021) 90 capsule 0  ? glucose blood test strip ACCU-CHEK AVIVA PLUS TEST STRP Use to check sugars 3 times daily.    ? HUMALOG KWIKPEN 100 UNIT/ML KwikPen Inject into the skin.    ? iron polysaccharides (NIFEREX)  150 MG capsule Take 1 capsule (150 mg total) by mouth daily. 30 capsule 0  ? Magnesium 400 MG CAPS Take by mouth daily. (Patient not taking: Reported on 07/06/2021)    ? meloxicam (MOBIC) 7.5 MG tablet

## 2021-12-29 NOTE — Discharge Instructions (Signed)
Please follow-up with your primary care doctor and your neurologist.  Please return for any problems. ?

## 2021-12-29 NOTE — ED Notes (Signed)
Pts daughter is here. Questions answered to best of this RN's ability. Psych team to come talk to daughter. ?

## 2021-12-29 NOTE — ED Notes (Signed)
VOL/pending psych consult 

## 2021-12-29 NOTE — ED Notes (Signed)
PIV removed; catheter intact; site WNL; gauze & tape in place. ? ?Pt getting dressed for d/c. ?

## 2021-12-29 NOTE — ED Notes (Signed)
Pt asking for something for itching. Secure msg sent to Dr. Cinda Quest, as there is nothing ordered for itching at this time. ?

## 2021-12-29 NOTE — ED Notes (Signed)
Pt does not want to take insulin, stating that it will make her blood sugar go too low with her CBG at 166. Daughter at bedside agrees with pt.  ? ?Daughter is going to get pts clothes from her house & will be back for discharge after. ?

## 2021-12-29 NOTE — Consult Note (Signed)
[facility] Face-to-Face Psychiatry Consult  ? ?Reason for Consult: Hallucinations  ?Referring Physician:  Dr. Jari Pigg ?Patient Identification: [name] ?MRN:  [id number] ?Principal Diagnosis: <principal problem not specified> ?Diagnosis:  Active Problems: ?  Abnormal gait ?  Anxiety state ?  Chronic kidney disease ?  Fall ?  Chronic obstructive pulmonary disease (HCC) ?  Lewy body dementia ([place]) ?  Depression ?  Type II diabetes mellitus with renal manifestations ([place]) ?  Dementia ([place]) ?  AMS (altered mental status) ? ? ?Total Time spent with patient: 1 hour ? ?Subjective: "It was so real. I know I was hallucinating." ?[name] is a 88 y.o. female patient presented to [facility] ED from home, and she is voluntary. The patient has been diagnosed with Lewy body dementia. For many years she dealt with hallucinations, and she could cope with the symptoms. Per her daughter and the patient, the past hallucinations have become unbearable in the 2 to 3 days. The patient has a history of hallucinogenic dementia at baseline. The patient reports having lots of people coming into her home.  She denies any thoughts of HI except for the hallucinations that she is seeing.  She denies any SI.  Denies any other medical concerns. ? ?This provider saw the patient face-to-face; the chart was reviewed, and consulted with Dr. Jari Pigg on 12/29/2021 due to the patient's care. It was discussed with the EDP that the patient does meet the criteria to be admitted to the geriatric-psychiatric inpatient unit due to safety concerns. ?On evaluation, the patient is alert and oriented x 4, anxious but cooperative, and mood-congruent with affect. The patient does not appear to be responding to internal or external stimuli. Neither is the patient presenting with any delusional thinking. The patient denies auditory hallucinations but admits visual hallucinations, which have worsened today. The patient denies any suicidal, homicidal, or self-harm  ideations. The patient is not presenting with any psychotic or paranoid behaviors. During an encounter with the patient, she could answer questions appropriately. ? ?HPI: Per Dr. Jari Pigg, [name] is a 88 y.o. female with Lewy body dementia, A-fib on aspirin and Plavix who comes in from home due to concerns for hallucinations have gotten unbearable the past 2 to 3 days.  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 ?  ?Patient reports having lots of people coming into her home.  She denies any thoughts of HI except for the hallucinations that she is seeing.  She denies any SI.  Denies any other medical concerns.  Denies any falls, hitting her head ?  ?I reviewed patient's neurology note from 11/09/2021 for patient has notably by dementia with visual hallucinations and issues with sleep.  Patient was on Seroquel 50 mg mouth every night as well as in the Namenda '5mg'$  BID.  She had a CT head in October 2022 that was otherwise reassuring. ? ?Past Psychiatric History:  ? ?Risk to Self:   ?Risk to Others:   ?Prior Inpatient Therapy:   ?Prior Outpatient Therapy:   ? ?Past Medical History:  ?Past Medical History:  ?Diagnosis Date  ? Angina pectoris ([place])   ? Atrial fibrillation ([place])   ? Basal cell carcinoma   ? nose  ? Cervicalgia   ? Chronic back pain   ? Chronic obstructive pulmonary disease (COPD) ([place])   ? COPD (chronic obstructive pulmonary disease) ([place])   ? DDD (degenerative disc disease), lumbar   ? Diabetes mellitus without complication ([place])   ? Dysphagia   ?  Hyperlipemia   ? Hypertension   ? Squamous cell carcinoma of skin   ? left lateral pretibial  ? Vulvovaginitis   ?  ?Past Surgical History:  ?Procedure Laterality Date  ? gall bladder    ? melanoma    ? removal neck and back  ? PERIPHERAL VASCULAR CATHETERIZATION Left 01/05/2016  ? Procedure: Lower Extremity Angiography;  Surgeon: Algernon Huxley, MD;  Location: [place] [facility];  Service: Cardiovascular;  Laterality: Left;  ?  PERIPHERAL VASCULAR CATHETERIZATION  01/05/2016  ? Procedure: Lower Extremity Intervention;  Surgeon: Algernon Huxley, MD;  Location: [place] [facility];  Service: Cardiovascular;;  ? ureterolithiasis    ? calculus removed  ? VAGINAL HYSTERECTOMY    ? VISCERAL ANGIOGRAPHY N/A 05/10/2019  ? Procedure: VISCERAL ANGIOGRAPHY;  Surgeon: Algernon Huxley, MD;  Location: [place] [facility];  Service: Cardiovascular;  Laterality: N/A;  ? VULVA / PERINEUM BIOPSY  05/29/2015  ? ?Family History:  ?Family History  ?Problem Relation Age of Onset  ? Ovarian cancer Other   ? Diabetes Sister   ? Colon cancer Brother   ? Diabetes Brother   ? Atrial fibrillation Daughter   ? Breast cancer Neg Hx   ? ?Family Psychiatric  History:  ?Social History:  ?Social History  ? ?Substance and Sexual Activity  ?Alcohol Use No  ?   ?Social History  ? ?Substance and Sexual Activity  ?Drug Use No  ?  ?Social History  ? ?Socioeconomic History  ? Marital status: Widowed  ?  Spouse name: Not on file  ? Number of children: 3  ? Years of education: Not on file  ? Highest education level: 8th grade  ?Occupational History  ? Occupation: retired  ?Tobacco Use  ? Smoking status: Never  ? Smokeless tobacco: Never  ?Vaping Use  ? Vaping Use: Never used  ?Substance and Sexual Activity  ? Alcohol use: No  ? Drug use: No  ? Sexual activity: Never  ?Other Topics Concern  ? Not on file  ?Social History Narrative  ? Not on file  ? ?Social Determinants of Health  ? ?Financial Resource Strain: Not on file  ?Food Insecurity: Not on file  ?Transportation Needs: Not on file  ?Physical Activity: Not on file  ?Stress: Not on file  ?Social Connections: Not on file  ? ?Additional Social History: ?  ? ?Allergies:   ?Allergies  ?Allergen Reactions  ? Bacitracin-Neomycin-Polymyxin Rash  ? Neomycin-Bacitracin Zn-Polymyx Swelling and Rash  ? Latex Rash  ? Lidocaine Rash  ? Aricept [Donepezil Hcl] Diarrhea  ? Benzalkonium Chloride Itching and Swelling  ? Ibuprofen   ?  Other reaction(s):  Dizziness ?Heart fluttering. ?Tachycardia. ?Tachycardia.  ? Lidocaine Hcl Itching  ?  Per pt, "all caine meds cause severe itching"  ? Valdecoxib Nausea And Vomiting  ? Albuterol Rash  ? Tape Rash  ?  blisters  ? Triamcinolone Rash  ? ? ?Labs:  ?Results for orders placed or performed during the hospital encounter of 12/28/21 (from the past 48 hour(s))  ?Comprehensive metabolic panel     Status: Abnormal  ? Collection Time: 12/28/21  3:26 PM  ?Result Value Ref Range  ? Sodium 133 (L) 135 - 145 mmol/L  ? Potassium 4.1 3.5 - 5.1 mmol/L  ? Chloride 98 98 - 111 mmol/L  ? CO2 28 22 - 32 mmol/L  ? Glucose, Bld 231 (H) 70 - 99 mg/dL  ?  Comment: Glucose reference range applies only to samples  taken after fasting for at least 8 hours.  ? BUN 20 8 - 23 mg/dL  ? Creatinine, Ser 0.84 0.44 - 1.00 mg/dL  ? Calcium 8.9 8.9 - 10.3 mg/dL  ? Total Protein 7.3 6.5 - 8.1 g/dL  ? Albumin 3.9 3.5 - 5.0 g/dL  ? AST 19 15 - 41 U/L  ? ALT 16 0 - 44 U/L  ? Alkaline Phosphatase 128 (H) 38 - 126 U/L  ? Total Bilirubin 0.9 0.3 - 1.2 mg/dL  ? GFR, Estimated >60 >60 mL/min  ?  Comment: (NOTE) ?Calculated using the CKD-EPI Creatinine Equation (2021) ?  ? Anion gap 7 5 - 15  ?  Comment: Performed at [facility], [address] [place], [place] [address]  ?Ethanol     Status: None  ? Collection Time: 12/28/21  3:26 PM  ?Result Value Ref Range  ? Alcohol, Ethyl (B) <10 <10 mg/dL  ?  Comment: (NOTE) ?Lowest detectable limit for serum alcohol is 10 mg/dL. ? ?For medical purposes only. ?Performed at [facility], [place], ?[place] [address] ?  ?Salicylate level     Status: Abnormal  ? Collection Time: 12/28/21  3:26 PM  ?Result Value Ref Range  ? Salicylate Lvl <6.4 (L) 7.0 - 30.0 mg/dL  ?  Comment: Performed at [facility], [address], [place], [place] [address]  ?Acetaminophen level     Status: Abnormal  ? Collection Time: 12/28/21  3:26 PM  ?Result Value Ref Range  ? Acetaminophen (Tylenol), Serum  <10 (L) 10 - 30 ug/mL  ?  Comment: (NOTE) ?Therapeutic concentrations vary significantly. A range of 10-30 ug/mL  ?may be an effective concentration for many patients. However, some  ?are best treated at c

## 2021-12-29 NOTE — ED Notes (Signed)
VOL/recommended psych inpatient admission when medically cleared. ?

## 2021-12-30 ENCOUNTER — Ambulatory Visit: Payer: Medicare Other | Admitting: Dermatology

## 2021-12-31 DIAGNOSIS — F02818 Dementia in other diseases classified elsewhere, unspecified severity, with other behavioral disturbance: Secondary | ICD-10-CM | POA: Diagnosis not present

## 2022-01-05 NOTE — Progress Notes (Signed)
?  ? ? ?Established patient visit ? ? ?Patient: [name]   DOB: 01/05/1929   88 y.o. Female  MRN: [id number] ?Visit Date: 01/06/2022 ? ?Today's healthcare provider: Wilhemena Durie, MD  ? ?Chief Complaint  ?Patient presents with  ? Follow-up  ? Dementia  ? ?Subjective  ?  ?HPI  ?Patient has been living with one of her 2 daughters.  She is accompanied by her son today.  She continues to have hallucinations and has had more recently.  He is not dangerous and has no behavioral disturbances.  He wishes to try to go back home and live independently although she knows that they will need to look in on her.  She does have a life alert monitor.  She has had no recent falls and walks with a walker.  Her dementia is slowly worsening.  The main reason for the visit today is the patient would like to move back into her own home to live independently and her son which is just about her daughter's I think are not in favor of this. ?Follow up for Moderate Lewy body dementia with anxiety: ? ?The patient was last seen for this 5 months ago. ?Changes made at last visit include; sees Neurology. On Namenda.Patient son states that patient has been increased to '20mg'$ .  ?She reports excellent compliance with treatment. ?She feels that condition is Unchanged. ?She is not having side effects.  ?Patient is accompanied with her son today who has concerns because patient reported a week ago that she was "seeing people" that were not there. Patients son states that patient wishes to stay at home and is currently staying with her daughter, he would like to make sure patient would be okay to be back in her home.   ?----------------------------------------------------------------------------------------- ? ? ?Medications: ?Outpatient Medications Prior to Visit  ?Medication Sig  ? aspirin 325 MG tablet Take 325 mg by mouth daily. Reported on 01/05/2016  ? atorvastatin (LIPITOR) 10 MG tablet TAKE 1 TABLET BY MOUTH ONCE DAILY.  ?  cetirizine (ZYRTEC) 10 MG tablet TAKE ONE TABLET BY MOUTH EVERY DAY  ? cholecalciferol (VITAMIN D3) 25 MCG (1000 UT) tablet Take 1,000 Units by mouth daily.  ? clopidogrel (PLAVIX) 75 MG tablet TAKE 1 TABLET BY MOUTH DAILY  ? Crisaborole (EUCRISA) 2 % OINT Apply 1 application topically 2 (two) times daily.  ? diltiazem (TIADYLT ER) 360 MG 24 hr capsule Hold until followup with your outpatient doctor due to your blood pressure on the low side.  ? ezetimibe (ZETIA) 10 MG tablet TAKE 1 TABLET BY MOUTH DAILY  ? FLUoxetine (PROZAC) 20 MG capsule Take 1 capsule (20 mg total) by mouth daily.  ? glucose blood test strip ACCU-CHEK AVIVA PLUS TEST STRP Use to check sugars 3 times daily.  ? HUMALOG KWIKPEN 100 UNIT/ML KwikPen Inject into the skin.  ? insulin glargine (LANTUS) 100 UNIT/ML injection Inject 0.05 mLs (5 Units total) into the skin at bedtime.  ? insulin lispro (HUMALOG) 100 UNIT/ML KiwkPen Inject 10 Units into the skin 3 (three) times daily. 10 units before breakfast, 8 before lunch and 18 before supper  ? isosorbide mononitrate (IMDUR) 30 MG 24 hr tablet Take 30 mg by mouth daily.  ? lansoprazole (PREVACID) 30 MG capsule TAKE 1 CAPSULE BY MOUTH ONCE DAILY AT NOON  ? losartan (COZAAR) 100 MG tablet TAKE 1 TABLET BY MOUTH DAILY  ? Magnesium 400 MG CAPS Take by mouth daily.  ? magnesium oxide (MAG-OX) 400 MG  tablet Take 400 mg by mouth daily.  ? meloxicam (MOBIC) 7.5 MG tablet Take 1 tablet (7.5 mg total) by mouth daily as needed for pain.  ? memantine (NAMENDA) 10 MG tablet Take 10 mg by mouth 2 (two) times daily.  ? memantine (NAMENDA) 5 MG tablet TAKE ONE TABLET TWICE DAILY  ? montelukast (SINGULAIR) 10 MG tablet TAKE ONE TABLET AT BEDTIME  ? ondansetron (ZOFRAN-ODT) 4 MG disintegrating tablet Take 4 mg by mouth every 8 (eight) hours as needed for nausea. For up to 12 doses.  ? pantoprazole (PROTONIX) 40 MG tablet Take 40 mg by mouth daily.  ? QUEtiapine (SEROQUEL) 25 MG tablet Take 1 tablet by mouth at bedtime.   ? QUEtiapine (SEROQUEL) 50 MG tablet Take 50 mg by mouth at bedtime.  ? tacrolimus (PROTOPIC) 0.1 % ointment Apply topically 2 (two) times daily.  ? traZODone (DESYREL) 50 MG tablet TAKE 1 AND 1/2 TABLET AT BEDTIME AS NEEDED FOR SLEEP  ? TUSSIN DM 100-10 MG/5ML liquid TAKE 1 TEASPOONFUL BY MOUTH EVERY 4 HOURS AS NEEDED FOR COUGH  ? vitamin B-12 1000 MCG tablet Take 1 tablet (1,000 mcg total) by mouth daily.  ? vitamin E 1000 UNIT capsule Take 1,000 Units by mouth daily.  ? ADVAIR HFA 230-21 MCG/ACT inhaler TAKE 2 PUFFS INTO LUNGS TWICE A DAY (Patient not taking: Reported on 01/06/2022)  ? albuterol (VENTOLIN HFA) 108 (90 Base) MCG/ACT inhaler INHALE 2 PUFFS INTO THE LUNGS EVERY 4 HOURS AS NEEDED FOR WHEEZING ORSHORTNESS OF BREATH (Patient not taking: Reported on 01/06/2022)  ? iron polysaccharides (NIFEREX) 150 MG capsule Take 1 capsule (150 mg total) by mouth daily.  ? ?No facility-administered medications prior to visit.  ? ? ?Review of Systems  ?Constitutional:  Negative for appetite change, chills, fatigue and fever.  ?Respiratory:  Negative for chest tightness and shortness of breath.   ?Cardiovascular:  Negative for chest pain and palpitations.  ?Gastrointestinal:  Negative for abdominal pain, nausea and vomiting.  ?Neurological:  Negative for dizziness and weakness.  ? ?Last thyroid functions ?Lab Results  ?Component Value Date  ? TSH 1.790 05/08/2021  ? ?  ?  Objective  ?  ?BP (!) 144/72   Pulse (!) 109   Resp 15   Wt 133 lb 3.2 oz (60.4 kg)   SpO2 99%   BMI 24.36 kg/m?  ?BP Readings from Last 3 Encounters:  ?01/06/22 (!) 144/72  ?12/29/21 138/75  ?07/29/21 (!) 177/79  ? ?Wt Readings from Last 3 Encounters:  ?01/06/22 133 lb 3.2 oz (60.4 kg)  ?07/29/21 131 lb (59.4 kg)  ?06/25/21 133 lb (60.3 kg)  ? ?  ? ?Physical Exam ?Vitals reviewed.  ?Constitutional:   ?   Appearance: She is well-developed.  ?HENT:  ?   Head: Normocephalic and atraumatic.  ?Eyes:  ?   General: No scleral icterus. ?    Conjunctiva/sclera: Conjunctivae normal.  ?Neck:  ?   Thyroid: No thyromegaly.  ?   Vascular: No carotid bruit.  ?   Comments: She has bilateral carotid bruit. ?Cardiovascular:  ?   Rate and Rhythm: Normal rate and regular rhythm.  ?Pulmonary:  ?   Effort: Pulmonary effort is normal.  ?Abdominal:  ?   Palpations: Abdomen is soft.  ?Skin: ?   General: Skin is warm and dry.  ?   Findings: Laceration present.  ?   Comments: Very fair skin. ?  ?Neurological:  ?   Mental Status: She is alert. Mental status is at  baseline.  ?Psychiatric:     ?   Mood and Affect: Mood normal.     ?   Behavior: Behavior normal.  ?  ? ? ?No results found for any visits on 01/06/22. ? Assessment & Plan  ?  ? ?1. Moderate Lewy body dementia with anxiety ([place]) ?Patient has appointment for psychiatry and for neurology in the next week. ?More than 50% 30 minute visit spent in counseling or coordination of care ? ?- memantine (NAMENDA) 10 MG tablet; Take 10 mg by mouth 2 (two) times daily. ? ?2. Hallucinations ?Basically have accelerated them how many hallucinations the patient has lately.  She has no behavioral disturbance with this.  Family is aware this will not improve.  Could increase Seroquel dosing or change to a different atypical but this is risky also. ? ?3. At high risk for falls ?She uses walker.  I stress her highest risk of living alone is falls.  She has a life alert button. ?After lengthy discussion I think it is reasonable to try to live alone.  He does not drive and does not attempt to cook.  I think what will and her independence will be a fall. ? ?4. Bilateral carotid artery stenosis ?Risk factors treated ? ?5. 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[place]) ? ? ?6. Dermatitis ?She has excoriations from scratching herself. ?This is also been going on for years. ?Request refills of triamcinolone cream. ?7. Itching ? ? ?8. Coronary artery disease of native artery of native heart with stable angina pectoris ([place]) ?Risk factors treated ? ?9.  Benign essential HTN ?Check home blood pressures. ? ? ? ? ?Return in about 3 months (around 04/12/2022).  ?   ? ?I, Wilhemena Durie, MD, have reviewed all documentation for this visit. The documentation on 01/10/22 for the exam, d

## 2022-01-06 ENCOUNTER — Encounter: Payer: Self-pay | Admitting: Family Medicine

## 2022-01-06 ENCOUNTER — Ambulatory Visit (INDEPENDENT_AMBULATORY_CARE_PROVIDER_SITE_OTHER): Payer: Medicare Other | Admitting: Family Medicine

## 2022-01-06 VITALS — BP 144/72 | HR 109 | Resp 15 | Wt 133.2 lb

## 2022-01-06 DIAGNOSIS — L299 Pruritus, unspecified: Secondary | ICD-10-CM

## 2022-01-06 DIAGNOSIS — I25118 Atherosclerotic heart disease of native coronary artery with other forms of angina pectoris: Secondary | ICD-10-CM

## 2022-01-06 DIAGNOSIS — C439 Malignant melanoma of skin, unspecified: Secondary | ICD-10-CM

## 2022-01-06 DIAGNOSIS — I6523 Occlusion and stenosis of bilateral carotid arteries: Secondary | ICD-10-CM

## 2022-01-06 DIAGNOSIS — F02B4 Dementia in other diseases classified elsewhere, moderate, with anxiety: Secondary | ICD-10-CM | POA: Diagnosis not present

## 2022-01-06 DIAGNOSIS — I10 Essential (primary) hypertension: Secondary | ICD-10-CM

## 2022-01-06 DIAGNOSIS — G3183 Dementia with Lewy bodies: Secondary | ICD-10-CM

## 2022-01-06 DIAGNOSIS — L309 Dermatitis, unspecified: Secondary | ICD-10-CM

## 2022-01-06 DIAGNOSIS — R443 Hallucinations, unspecified: Secondary | ICD-10-CM

## 2022-01-06 DIAGNOSIS — Z9181 History of falling: Secondary | ICD-10-CM | POA: Diagnosis not present

## 2022-01-06 MED ORDER — TRIAMCINOLONE ACETONIDE 0.1 % EX CREA: TOPICAL_CREAM | CUTANEOUS | 1 refills | Status: DC

## 2022-01-26 ENCOUNTER — Other Ambulatory Visit: Payer: Self-pay

## 2022-01-26 ENCOUNTER — Ambulatory Visit: Payer: Medicare Other | Admitting: Family Medicine

## 2022-01-26 ENCOUNTER — Encounter: Payer: Self-pay | Admitting: Emergency Medicine

## 2022-01-26 ENCOUNTER — Emergency Department: Payer: Medicare Other

## 2022-01-26 DIAGNOSIS — M1612 Unilateral primary osteoarthritis, left hip: Secondary | ICD-10-CM | POA: Diagnosis not present

## 2022-01-26 DIAGNOSIS — M48061 Spinal stenosis, lumbar region without neurogenic claudication: Secondary | ICD-10-CM | POA: Diagnosis not present

## 2022-01-26 DIAGNOSIS — B9689 Other specified bacterial agents as the cause of diseases classified elsewhere: Secondary | ICD-10-CM | POA: Insufficient documentation

## 2022-01-26 DIAGNOSIS — G3183 Dementia with Lewy bodies: Secondary | ICD-10-CM | POA: Diagnosis not present

## 2022-01-26 DIAGNOSIS — N390 Urinary tract infection, site not specified: Secondary | ICD-10-CM | POA: Diagnosis not present

## 2022-01-26 DIAGNOSIS — M545 Low back pain, unspecified: Secondary | ICD-10-CM | POA: Diagnosis not present

## 2022-01-26 DIAGNOSIS — M25452 Effusion, left hip: Secondary | ICD-10-CM | POA: Diagnosis not present

## 2022-01-26 DIAGNOSIS — M4316 Spondylolisthesis, lumbar region: Secondary | ICD-10-CM | POA: Diagnosis not present

## 2022-01-26 DIAGNOSIS — M5136 Other intervertebral disc degeneration, lumbar region: Secondary | ICD-10-CM | POA: Diagnosis not present

## 2022-01-26 DIAGNOSIS — M25552 Pain in left hip: Secondary | ICD-10-CM | POA: Insufficient documentation

## 2022-01-26 LAB — COMPREHENSIVE METABOLIC PANEL
ALT: 14 U/L (ref 0–44)
AST: 17 U/L (ref 15–41)
Albumin: 3.6 g/dL (ref 3.5–5.0)
Alkaline Phosphatase: 84 U/L (ref 38–126)
Anion gap: 8 (ref 5–15)
BUN: 31 mg/dL — ABNORMAL HIGH (ref 8–23)
CO2: 25 mmol/L (ref 22–32)
Calcium: 8.8 mg/dL — ABNORMAL LOW (ref 8.9–10.3)
Chloride: 103 mmol/L (ref 98–111)
Creatinine, Ser: 1.13 mg/dL — ABNORMAL HIGH (ref 0.44–1.00)
GFR, Estimated: 46 mL/min — ABNORMAL LOW (ref >60)
Glucose, Bld: 256 mg/dL — ABNORMAL HIGH (ref 70–99)
Potassium: 4.5 mmol/L (ref 3.5–5.1)
Sodium: 136 mmol/L (ref 135–145)
Total Bilirubin: 0.3 mg/dL (ref 0.3–1.2)
Total Protein: 6.9 g/dL (ref 6.5–8.1)

## 2022-01-26 LAB — CBC WITH DIFFERENTIAL/PLATELET
Abs Immature Granulocytes: 0.02 10*3/uL (ref 0.00–0.07)
Basophils Absolute: 0.1 10*3/uL (ref 0.0–0.1)
Basophils Relative: 1 %
Eosinophils Absolute: 1.6 10*3/uL — ABNORMAL HIGH (ref 0.0–0.5)
Eosinophils Relative: 16 %
HCT: 33.5 % — ABNORMAL LOW (ref 36.0–46.0)
Hemoglobin: 10.3 g/dL — ABNORMAL LOW (ref 12.0–15.0)
Immature Granulocytes: 0 %
Lymphocytes Relative: 21 %
Lymphs Abs: 2.1 10*3/uL (ref 0.7–4.0)
MCH: 24.1 pg — ABNORMAL LOW (ref 26.0–34.0)
MCHC: 30.7 g/dL (ref 30.0–36.0)
MCV: 78.3 fL — ABNORMAL LOW (ref 80.0–100.0)
Monocytes Absolute: 0.9 10*3/uL (ref 0.1–1.0)
Monocytes Relative: 9 %
Neutro Abs: 5.3 10*3/uL (ref 1.7–7.7)
Neutrophils Relative %: 53 %
Platelets: 301 10*3/uL (ref 150–400)
RBC: 4.28 MIL/uL (ref 3.87–5.11)
RDW: 18.5 % — ABNORMAL HIGH (ref 11.5–15.5)
WBC: 10.1 10*3/uL (ref 4.0–10.5)
nRBC: 0 % (ref 0.0–0.2)

## 2022-01-26 LAB — URINALYSIS, ROUTINE W REFLEX MICROSCOPIC
Bacteria, UA: NONE SEEN
Bilirubin Urine: NEGATIVE
Glucose, UA: 50 mg/dL — AB
Hgb urine dipstick: NEGATIVE
Ketones, ur: NEGATIVE mg/dL
Nitrite: NEGATIVE
Protein, ur: 30 mg/dL — AB
Specific Gravity, Urine: 1.018 (ref 1.005–1.030)
WBC, UA: >50 WBC/hpf — ABNORMAL HIGH (ref 0–5)
pH: 5 (ref 5.0–8.0)

## 2022-01-26 NOTE — ED Triage Notes (Signed)
Pt to ED from home with daughter c/o left front hip/pelvis pain that's been going on for months but worse tonight.  States her PCP says arthritis.  Patient denies new injuries or falls, urinary changes, n/v/d.  States pain runs down from of her leg and caused difficulty walking tonight.  Pt A&Ox4, chest rise even and unlabored. ? Roderic Palau, [place] in triage for MSE. ?

## 2022-01-26 NOTE — ED Provider Triage Note (Signed)
Emergency Medicine Provider Triage Evaluation Note ? ?[name] , a 88 y.o. female  was evaluated in triage.  Pt complains of left hip/groin pain.  Patient presents to the ED with her daughter for complaint of increased left hip/groin pain.  This has been ongoing for some time.  Diagnosed with arthritis.  However the pain has worsened in the last couple of days.  This is decreasing patient's ability to ambulate and she does live by herself.  No recent trauma according to the daughter.  The patient has had no vomiting, diarrhea, urinary changes.. ? ?Review of Systems  ?Positive: Left hip pain ?Negative: Fevers, chills, vomiting, diarrhea, urinary changes ? ?Physical Exam  ?BP (!) 187/96 (BP Location: Right Arm)   Pulse (!) 106   Temp 98.2 ?F (36.8 ?C) (Oral)   Resp 16   Ht '5\' 2"'$  (1.575 m)   Wt 59 kg   SpO2 94%   BMI 23.78 kg/m?  ?Gen:   Awake, no distress   ?Resp:  Normal effort  ?MSK:   Moves extremities without difficulty.  Patient is tender to palpation diffusely about the anterior and lateral hip.  No tenderness into the suprapubic region.  No tenderness in the lumbar region. ?Other:  No abdominal tenderness to palpation ? ?Medical Decision Making  ?Medically screening exam initiated at 10:02 PM.  Appropriate orders placed.  [name] was informed that the remainder of the evaluation will be completed by another provider, this initial triage assessment does not replace that evaluation, and the importance of remaining in the ED until their evaluation is complete. ? ?Patient presents with worsening left hip pain.  Patient will have x-rays of the hip, lumbar spine as well as basic labs. ?  ?Darletta Moll, PA-C ?01/26/22 2202 ? ?

## 2022-01-27 ENCOUNTER — Emergency Department: Payer: Medicare Other

## 2022-01-27 ENCOUNTER — Emergency Department
Admission: EM | Admit: 2022-01-27 | Discharge: 2022-01-27 | Disposition: A | Discharge status: Home / Self Care | Payer: Medicare Other | Attending: Emergency Medicine | Admitting: Emergency Medicine

## 2022-01-27 DIAGNOSIS — M25452 Effusion, left hip: Secondary | ICD-10-CM | POA: Diagnosis not present

## 2022-01-27 DIAGNOSIS — M48061 Spinal stenosis, lumbar region without neurogenic claudication: Secondary | ICD-10-CM | POA: Diagnosis not present

## 2022-01-27 DIAGNOSIS — M25552 Pain in left hip: Secondary | ICD-10-CM

## 2022-01-27 DIAGNOSIS — M4316 Spondylolisthesis, lumbar region: Secondary | ICD-10-CM | POA: Diagnosis not present

## 2022-01-27 DIAGNOSIS — M1612 Unilateral primary osteoarthritis, left hip: Secondary | ICD-10-CM | POA: Diagnosis not present

## 2022-01-27 DIAGNOSIS — N390 Urinary tract infection, site not specified: Secondary | ICD-10-CM

## 2022-01-27 LAB — CBG MONITORING, ED: Glucose-Capillary: 168 mg/dL — ABNORMAL HIGH (ref 70–99)

## 2022-01-27 MED ORDER — OXYCODONE-ACETAMINOPHEN 5-325 MG PO TABS
1.0000 | ORAL_TABLET | Freq: Once | ORAL | Status: AC
  Administered 2022-01-27: 1 via ORAL
  Filled 2022-01-27: qty 1

## 2022-01-27 MED ORDER — FOSFOMYCIN TROMETHAMINE 3 G PO PACK
3.0000 g | PACK | Freq: Once | ORAL | Status: AC
  Administered 2022-01-27: 3 g via ORAL
  Filled 2022-01-27: qty 3

## 2022-01-27 NOTE — ED Notes (Signed)
Pt assisted to restroom. Ambulated with 2 person assist. Pt reported pain on movement but in NAD. Pt back in bed at this time with call bell in reach.  ?

## 2022-01-27 NOTE — ED Provider Notes (Signed)
Emergency department handoff note ? ?Care of this patient was signed out to me at the end of the previous provider shift.  All pertinent patient information was conveyed and all questions were answered.  Patient pending results of left hip MRI which showed arthritis of the left hip, a free foreign body inside the left hip joint, and gluteus minimus tendinosis which is likely the accounting for patient's pain.  Upon reassessment, patient's daughter at bedside and states that she is unable to care for her at home given her lack of mobility due to this left hip pain.  I discussed with the patient and patient's caregiver at length about the possibilities that we can provide here today including a physical therapy evaluation and possible placement in a skilled nursing facility and/or inpatient/outpatient rehabilitation for increasing her mobility.  Patient agreed to a physical therapy evaluation and exploration of her options. ?  ?Naaman Plummer, MD ?01/27/22 [phone] ? ?

## 2022-01-27 NOTE — TOC Initial Note (Signed)
Transition of Care (TOC) - Initial/Assessment Note  ? ? ?Patient Details  ?Name: [name] ?MRN: [id number] ?Date of Birth: [birth year] ? ?Transition of Care (TOC) CM/SW Contact:    ?Shelbie Hutching, RN ?Phone Number: ?01/27/2022, 3:42 PM ? ?Clinical Narrative:                 ?Patient came into the emergency room with pain in her leg that was limiting her ability to walk.  Patient has seen her primary care Dr. Rosanna Randy a week or 2 ago and he said patient has arthritis.  PT has seen patient here in ER and is recommending home health PT.  Patient agrees to go home with home health services.  [facility] has accepted referral for PT and OT and SW.  Patient has a RW and rollator at home but Dr. Rosanna Randy has advised patient to only use her RW for stability.   ?RNCM contacted patient's daughter Thayer Headings, she will be coming to pick patient up, MD has discharged from the ED.   ? ?  ?Barriers to Discharge: Barriers Resolved ? ? ?Patient Goals and CMS Choice ?Patient states their goals for this hospitalization and ongoing recovery are:: patient wants to go home ?CMS Medicare.gov Compare Post Acute Care list provided to:: Patient ?Choice offered to / list presented to : Patient ? ?Expected Discharge Plan and Services ?  ?  ?  ?  ?  ?                ?  ?  ?  ?  ?  ?  ?  ?  ?  ?  ? ?Prior Living Arrangements/Services ?  ?  ?  ?       ?  ?  ?  ?  ? ?Activities of Daily Living ?  ?  ? ?Permission Sought/Granted ?  ?  ?   ?   ?   ?   ? ?Emotional Assessment ?  ?  ?  ?  ?  ?  ? ?Admission diagnosis:  Left side pain ?Patient Active Problem List  ? Diagnosis Date Noted  ? Atherosclerosis of native arteries of the extremities with ulceration ([place]) 04/07/2021  ? PSVT (paroxysmal supraventricular tachycardia) ([place]) 02/11/2021  ? Acute metabolic encephalopathy 91/63/8466  ? Frequent falls 02/10/2021  ? COVID-19 virus infection 02/10/2021  ? Chronic kidney disease, stage 3a ([place]) 02/10/2021  ? Dementia ([place]) 02/10/2021  ? Garbled speech,  intermittent 02/10/2021  ? Laceration without foreign body, left ankle, subsequent encounter 02/10/2021  ? AMS (altered mental status) 02/10/2021  ? Coronary artery disease involving native coronary artery of native heart 04/17/2020  ? Celiac artery stenosis ([place]) 06/12/2019  ? Bilateral lower abdominal cramping 04/20/2019  ? UTI symptoms 04/20/2019  ? History of rectal bleeding 03/26/2019  ? DM type 2 with diabetic peripheral neuropathy ([place]) 12/14/2018  ? Arthropathy of lumbar facet joint 09/04/2018  ? Degeneration of lumbar intervertebral disc 09/04/2018  ? Spondylolisthesis, grade 1 09/04/2018  ? Lewy body dementia ([place]) 02/17/2018  ? PVD (peripheral vascular disease) ([place]) 08/16/2017  ? Pain in limb 07/27/2016  ? Hallucinations 07/07/2016  ? Chest pain 07/01/2016  ? Depression 06/23/2016  ? Hypokalemia 09/03/2015  ? Insomnia 07/23/2015  ? Headache 07/23/2015  ? Type 2 diabetes mellitus with vascular disease ([place]) 06/24/2015  ? Chronic vulvitis 06/02/2015  ? Allergic rhinitis 05/30/2015  ? Anemia 05/30/2015  ? Back ache 05/30/2015  ? Body mass index (BMI) of 29.0-29.9 in adult  05/30/2015  ? Edema 05/30/2015  ? Dilatation of esophagus 05/30/2015  ? Fall 05/30/2015  ? H/O deep venous thrombosis 05/30/2015  ? Hypercholesteremia 05/30/2015  ? Heart & renal disease, hypertensive, with heart failure ([place]) 05/30/2015  ? Hyponatremia 05/30/2015  ? Calculus of kidney 05/30/2015  ? Gonalgia 05/30/2015  ? Psoriasis 05/30/2015  ? Avitaminosis D 05/30/2015  ? Cough 04/01/2015  ? Combined fat and carbohydrate induced hyperlipemia 12/30/2014  ? Paroxysmal atrial fibrillation ([place]) 07/10/2014  ? Abnormal gait 07/08/2014  ? Anxiety state 07/08/2014  ? Chronic kidney disease 07/08/2014  ? Acid reflux 07/08/2014  ? 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[place]) 07/08/2014  ? Chronic obstructive pulmonary disease ([place]) 07/08/2014  ? Type II diabetes mellitus with renal manifestations ([place]) 07/08/2014  ? Benign essential HTN 06/26/2014  ?  Carotid artery narrowing 06/26/2014  ? Myalgia 06/26/2014  ? Basal cell carcinoma of face 05/01/2014  ? Disease of female genital organs 11/28/2012  ? Incomplete bladder emptying 09/05/2012  ? Excessive urination at night 08/15/2012  ? Basal cell carcinoma of ear 10/26/2011  ? ?PCP:  Jerrol Banana., MD ?Pharmacy:   ?[facility] - [place], [place] - [address] ?[place] [address] ?Phone: [phone] Fax: [phone] ? ? ? ? ?Social Determinants of Health (SDOH) Interventions ?  ? ?Readmission Risk Interventions ?   ? View : No data to display.  ?  ?  ?  ? ? ? ?

## 2022-01-27 NOTE — ED Provider Notes (Signed)
? ?[facility] ?Provider Note ? ? ? Event Date/Time  ? First MD Initiated Contact with Patient 01/27/22 [phone]   ?  (approximate) ? ? ?History  ? ?Pelvic Pain ? ? ?HPI ? ?[name] is a 88 y.o. female with extensive chronic medical issues including documented degeneration of lumbar intervertebral discs as well as documentation of Lewy body dementia.  However she is still relatively active for her age and lives by herself.  Her daughter is with her at this time.  She presents for evaluation of pain in her left hip.  She has been having worsening pain over the last few weeks but over the last 24 hours the pain has become quite severe and at one point it made her feel like she could not walk.  She was able to come in by private vehicle, but has severe pain in her left hip when she moves around.  She does not think she has any worse back pain in the normal, since she was has a little bit of lumbar back pain.  She has had no recent falls or other injuries of which she is aware and she has no other complaints or concerns tonight other than the pain in her left hip.  It does not radiate down her leg.  She has had no recent fever.  No weakness, just pain when she moves around. ?  ? ? ?Physical Exam  ? ?Triage Vital Signs: ?ED Triage Vitals  ?Enc Vitals Group  ?   BP 01/26/22 2200 (!) 187/96  ?   Pulse Rate 01/26/22 2200 (!) 106  ?   Resp 01/26/22 2200 16  ?   Temp 01/26/22 2200 98.2 ?F (36.8 ?C)  ?   Temp Source 01/26/22 2200 Oral  ?   SpO2 01/26/22 2200 94 %  ?   Weight 01/26/22 2201 59 kg (130 lb)  ?   Height 01/26/22 2201 1.575 m ('5\' 2"'$ )  ?   Head Circumference --   ?   Peak Flow --   ?   Pain Score 01/26/22 2201 10  ?   Pain Loc --   ?   Pain Edu? --   ?   Excl. in [place]? --   ? ? ?Most recent vital signs: ?Vitals:  ? 01/27/22 0630 01/27/22 0700  ?BP: (!) 174/70 140/64  ?Pulse: 90 88  ?Resp: 16 16  ?Temp:    ?SpO2: 96% 96%  ? ? ? ?General: Awake, no distress.  Elderly but in no distress.   Alert and oriented x3 on my exam. ?CV:  Good peripheral perfusion.   ?Resp:  Normal effort.  No accessory muscle usage. ?Abd:  No distention.  No tenderness to palpation of the abdomen. ?Other:  Pain with movement of the left lower extremity.  With some effort she is able to flex the knee and the left hip but it causes reproducible discomfort when she does so.  She has some tenderness to palpation of the left lateral hip but the pelvis is stable.  No appreciable neurological deficits.  No palpable deformities along the lumbar spine and no lumbar spine tenderness to palpation. ? ? ?ED Results / Procedures / Treatments  ? ?Labs ?(all labs ordered are listed, but only abnormal results are displayed) ?Labs Reviewed  ?COMPREHENSIVE METABOLIC PANEL - Abnormal; Notable for the following components:  ?    Result Value  ? Glucose, Bld 256 (*)   ? BUN 31 (*)   ?  Creatinine, Ser 1.13 (*)   ? Calcium 8.8 (*)   ? GFR, Estimated 46 (*)   ? All other components within normal limits  ?CBC WITH DIFFERENTIAL/PLATELET - Abnormal; Notable for the following components:  ? Hemoglobin 10.3 (*)   ? HCT 33.5 (*)   ? MCV 78.3 (*)   ? MCH 24.1 (*)   ? RDW 18.5 (*)   ? Eosinophils Absolute 1.6 (*)   ? All other components within normal limits  ?URINALYSIS, ROUTINE W REFLEX MICROSCOPIC - Abnormal; Notable for the following components:  ? Color, Urine STRAW (*)   ? APPearance CLEAR (*)   ? Glucose, UA 50 (*)   ? Protein, ur 30 (*)   ? Leukocytes,Ua LARGE (*)   ? WBC, UA >50 (*)   ? All other components within normal limits  ?URINE CULTURE  ? ? ? ?RADIOLOGY ?No acute fractures or dislocations on lumbar spine nor hip x-rays which I personally reviewed.  There is a degree of anterolisthesis on L4-L5.  See hospital course for additional details. ? ?Foraminal stenosis on lumbar spine MRI, no surgical emergency.  MRI left hip pending at time of signout to Dr. Cheri Fowler. ? ? ? ?PROCEDURES: ? ?Critical Care performed: No ? ?Procedures ? ? ?MEDICATIONS  ORDERED IN ED: ?Medications  ?oxyCODONE-acetaminophen (PERCOCET/ROXICET) 5-325 MG per tablet 1 tablet (1 tablet Oral Given 01/27/22 0404)  ?fosfomycin (MONUROL) packet 3 g (3 g Oral Given 01/27/22 0524)  ? ? ? ?IMPRESSION / MDM / ASSESSMENT AND PLAN / ED COURSE  ?I reviewed the triage vital signs and the nursing notes. ?             ?               ? ?Differential diagnosis includes, but is not limited to, spondyloarthropathy, arthritis, fracture, dislocation, avascular necrosis, worsening lumbar disc disease with sciatica, cauda equina syndrome, myositis, muscular injury. ? ?The patient is quite elderly and even though she is alert and oriented at this time, she has a documented history of Lewy body dementia, which brings into question at least some elements or the accuracy of her history.  She may have had a fall of which she is not aware.  I personally reviewed her x-rays and I did not identify any acute fractures.  The radiologist did not either, but mentioned a degree of anterolisthesis on L4-L5.  She has no obvious focal neurological deficits at this time but was having a great deal of trouble walking earlier tonight. ? ?Labs ordered in triage include CBC with differential, comprehensive metabolic panel, urinalysis, and urine culture.  The patient is not complaining of any urinary symptoms but she appears to have a urinary tract infection with greater than 50 WBCs seen but no bacteria.  Given the lack of symptoms and systemic complications, I will order a dose of fosfomycin 3 g by mouth.  Urine culture is pending.  CBC is reassuring with no evidence of acute abnormality including no leukocytosis nor significant anemia.  Comprehensive metabolic panel is within normal limits with no significant abnormality; her creatinine is slightly elevated over baseline but she reports that she has been eating and drinking okay and she is having no vomiting or diarrhea that should cause her to be volume depleted. ? ?Given the  possibility of occult fracture or soft tissue injury, I ordered MRI left hip and lumbar spine for better evaluation of the areas of concern.  I discussed the plan with the patient  and her daughter and they are both in agreement.  She is not in too much discomfort at this time but she said it does hurt when she moves around.  We talked about it and agreed to give 1 Percocet by mouth since she is not typically used to taking strong pain medicine. ? ? ?Clinical Course as of 01/27/22 0752  ?Wed Jan 27, 2022  ?0402 Of note, patient is hypertensive but has been in the emergency department for about 6 hours and not taking her medication.  She is very slightly tachycardic in triage but that has resolved by the time I saw her [CF]  ?0522 MR LUMBAR SPINE WO CONTRAST ?I personally reviewed the patient's lumbar spine MRI.  I saw no evidence of any acute abnormality.  The radiologist report comments that the stenosis has worsened since last images but there does not appear to be any evidence of cauda equina syndrome or neurosurgical emergency. [CF]  ?0715 I checked on the patient and her daughter twice during the last couple of hours, and they were both asleep, so I did not bother them.  Discussed case with Dr. Cheri Fowler who is assuming ED care and will reassess once the MRI hip results are back. [CF]  ?  ?Clinical Course User Index ?[CF] Hinda Kehr, MD  ? ? ? ?FINAL CLINICAL IMPRESSION(S) / ED DIAGNOSES  ? ?Final diagnoses:  ?Left hip pain  ?Urinary tract infection without hematuria, site unspecified  ? ? ? ?Rx / DC Orders  ? ?ED Discharge Orders   ? ? None  ? ?  ? ? ? ?Note:  This document was prepared using Dragon voice recognition software and may include unintentional dictation errors. ?  ?Hinda Kehr, MD ?01/27/22 [phone] ? ?

## 2022-01-27 NOTE — Evaluation (Signed)
Physical Therapy Evaluation ?Patient Details ?Name: [name] ?MRN: [id number] ?DOB: 1929-01-17 ?Today's Date: 01/27/2022 ? ?History of Present Illness ? [name] is a 88 y.o. female with extensive chronic medical issues including documented degeneration of lumbar intervertebral discs as well as documentation of Lewy body dementia.  However she is still relatively active for her age and lives by herself.  Her daughter is with her at this time.  She presents for evaluation of pain in her left hip.  She has been having worsening pain over the last few weeks but over the last 24 hours the pain has become quite severe and at one point it made her feel like she could not walk.  She was able to come in by private vehicle, but has severe pain in her left hip when she moves around. All imaging negative for acute injury. ?  ?Clinical Impression ? Patient received in bed, she is agreeable to PT session. She is mod independent with bed mobility and transfers. She is ambulating with min guard and RW in hallway. Mild L hip pain. She will continue to benefit from skilled PT while here to improve strength and independence for safe return home.    ?   ? ?Recommendations for follow up therapy are one component of a multi-disciplinary discharge planning process, led by the attending physician.  Recommendations may be updated based on patient status, additional functional criteria and insurance authorization. ? ?Follow Up Recommendations Home health PT ? ?  ?Assistance Recommended at Discharge Intermittent Supervision/Assistance  ?Patient can return home with the following ? Help with stairs or ramp for entrance;Assist for transportation;Assistance with cooking/housework;A little help with walking and/or transfers;A little help with bathing/dressing/bathroom ? ?  ?Equipment Recommendations None recommended by PT  ?Recommendations for Other Services ?    ?  ?Functional Status Assessment Patient has had a recent  decline in their functional status and demonstrates the ability to make significant improvements in function in a reasonable and predictable amount of time.  ? ?  ?Precautions / Restrictions Precautions ?Precautions: Fall ?Precaution Comments: mod fall ?Restrictions ?Weight Bearing Restrictions: No  ? ?  ? ?Mobility ? Bed Mobility ?Overal bed mobility: Modified Independent ?  ?  ?  ?  ?  ?  ?  ?  ? ?Transfers ?Overall transfer level: Modified independent ?Equipment used: Rolling walker (2 wheels) ?  ?  ?  ?  ?  ?  ?  ?  ?  ? ?Ambulation/Gait ?Ambulation/Gait assistance: Min guard ?Gait Distance (Feet): 200 Feet ?Assistive device: Rolling walker (2 wheels) ?Gait Pattern/deviations: Step-through pattern, Decreased step length - right, Decreased step length - left, Decreased stride length ?Gait velocity: WFL ?  ?  ?General Gait Details: patient ambulating well with RW, min guard. Mild left hip pain. ? ?Stairs ?  ?  ?  ?  ?  ? ?Wheelchair Mobility ?  ? ?Modified Rankin (Stroke Patients Only) ?  ? ?  ? ?Balance Overall balance assessment: Modified Independent ?  ?  ?  ?  ?  ?  ?  ?  ?  ?  ?  ?  ?  ?  ?  ?  ?  ?  ?   ? ? ? ?Pertinent Vitals/Pain Pain Assessment ?Pain Assessment: Faces ?Faces Pain Scale: Hurts a little bit ?Pain Location: L hip ?Pain Descriptors / Indicators: Discomfort ?Pain Intervention(s): Monitored during session, Repositioned  ? ? ?Home Living Family/patient expects to be discharged to:: Private residence ?Living  Arrangements: Alone ?Available Help at Discharge: Family;Available PRN/intermittently ?Type of Home: House ?Home Access: Stairs to enter ?Entrance Stairs-Rails:  (has a post she can hold to) ?Entrance Stairs-Number of Steps: 2 ?  ?Home Layout: One level ?Home Equipment: Conservation officer, nature (2 wheels) ?Additional Comments: Patient has son and 2 daughters that check on her daily. They provide meals and transportation to appointments.  ?  ?Prior Function Prior Level of Function : Independent/Modified  Independent ?  ?  ?  ?  ?  ?  ?Mobility Comments: ambulates with walker ?ADLs Comments: independent ?  ? ? ?Hand Dominance  ?   ? ?  ?Extremity/Trunk Assessment  ? Upper Extremity Assessment ?Upper Extremity Assessment: Overall WFL for tasks assessed ?  ? ?Lower Extremity Assessment ?Lower Extremity Assessment: Overall WFL for tasks assessed ?  ? ?Cervical / Trunk Assessment ?Cervical / Trunk Assessment: Normal  ?Communication  ? Communication: HOH  ?Cognition Arousal/Alertness: Awake/alert ?Behavior During Therapy: [facility] for tasks assessed/performed ?Overall Cognitive Status: Within Functional Limits for tasks assessed ?  ?  ?  ?  ?  ?  ?  ?  ?  ?  ?  ?  ?  ?  ?  ?  ?  ?  ?  ? ?  ?General Comments   ? ?  ?Exercises    ? ?Assessment/Plan  ?  ?PT Assessment Patient needs continued PT services  ?PT Problem List Decreased strength;Decreased mobility;Decreased activity tolerance;Pain ? ?   ?  ?PT Treatment Interventions Gait training;Therapeutic exercise;Stair training;Functional mobility training;Therapeutic activities;Patient/family education   ? ?PT Goals (Current goals can be found in the Care Plan section)  ?Acute Rehab PT Goals ?Patient Stated Goal: to return home ?PT Goal Formulation: With patient ?Time For Goal Achievement: 02/03/22 ?Potential to Achieve Goals: Good ? ?  ?Frequency Min 2X/week ?  ? ? ?Co-evaluation   ?  ?  ?  ?  ? ? ?  ?AM-PAC PT "6 Clicks" Mobility  ?Outcome Measure Help needed turning from your back to your side while in a flat bed without using bedrails?: None ?Help needed moving from lying on your back to sitting on the side of a flat bed without using bedrails?: None ?Help needed moving to and from a bed to a chair (including a wheelchair)?: A Little ?Help needed standing up from a chair using your arms (e.g., wheelchair or bedside chair)?: None ?Help needed to walk in hospital room?: A Little ?Help needed climbing 3-5 steps with a railing? : A Little ?6 Click Score: 21 ? ?  ?End of Session  Equipment Utilized During Treatment: Gait belt ?Activity Tolerance: Patient tolerated treatment well ?Patient left: in chair;with call bell/phone within reach ?Nurse Communication: Mobility status ?PT Visit Diagnosis: Muscle weakness (generalized) (M62.81);Difficulty in walking, not elsewhere classified (R26.2) ?  ? ?Time: 4268-3419 ?PT Time Calculation (min) (ACUTE ONLY): 20 min ? ? ?Charges:   PT Evaluation ?$PT Eval Moderate Complexity: 1 Mod ?PT Treatments ?$Gait Training: 8-22 mins ?  ?   ? ? ?[employer], PT, [employer] ?01/27/22,1:00 PM ? ? ?

## 2022-01-27 NOTE — ED Notes (Signed)
Pt changed into hospital gown, placed on purewick, and transferred to hospital bed. Pt provided toothbrush and assisted with dental hygiene. Pt resting comfortably in bed with call bell in reach.  ?

## 2022-01-28 LAB — URINE CULTURE

## 2022-02-03 DIAGNOSIS — N1832 Chronic kidney disease, stage 3b: Secondary | ICD-10-CM | POA: Diagnosis not present

## 2022-02-03 DIAGNOSIS — Z794 Long term (current) use of insulin: Secondary | ICD-10-CM | POA: Diagnosis not present

## 2022-02-03 DIAGNOSIS — E1122 Type 2 diabetes mellitus with diabetic chronic kidney disease: Secondary | ICD-10-CM | POA: Diagnosis not present

## 2022-02-04 DIAGNOSIS — E1122 Type 2 diabetes mellitus with diabetic chronic kidney disease: Secondary | ICD-10-CM | POA: Diagnosis not present

## 2022-02-04 DIAGNOSIS — N1832 Chronic kidney disease, stage 3b: Secondary | ICD-10-CM | POA: Diagnosis not present

## 2022-02-04 DIAGNOSIS — Z794 Long term (current) use of insulin: Secondary | ICD-10-CM | POA: Diagnosis not present

## 2022-02-05 DIAGNOSIS — E1165 Type 2 diabetes mellitus with hyperglycemia: Secondary | ICD-10-CM | POA: Diagnosis not present

## 2022-02-05 DIAGNOSIS — Z794 Long term (current) use of insulin: Secondary | ICD-10-CM | POA: Diagnosis not present

## 2022-02-24 ENCOUNTER — Ambulatory Visit: Payer: Medicare Other | Admitting: Dermatology

## 2022-02-24 DIAGNOSIS — L209 Atopic dermatitis, unspecified: Secondary | ICD-10-CM | POA: Diagnosis not present

## 2022-02-24 DIAGNOSIS — Z79899 Other long term (current) drug therapy: Secondary | ICD-10-CM

## 2022-02-24 MED ORDER — DUPILUMAB 300 MG/2ML ~~LOC~~ SOSY
600.0000 mg | PREFILLED_SYRINGE | Freq: Once | SUBCUTANEOUS | Status: AC
  Administered 2022-02-24: 600 mg via SUBCUTANEOUS

## 2022-02-24 MED ORDER — TACROLIMUS 0.1 % EX OINT: TOPICAL_OINTMENT | Freq: Two times a day (BID) | CUTANEOUS | 3 refills | Status: DC

## 2022-02-24 NOTE — Progress Notes (Signed)
   Follow-Up Visit   Subjective  [name] is a 88 y.o. female who presents for the following: Rash (Patient here today with daughter. Daughter reports patient has tried many over the counter creams, and triamcinolone cream. Creams did not help. Reports patient has been dealing with itchy and rash since August of last year. ).  The following portions of the chart were reviewed this encounter and updated as appropriate:  Tobacco  Allergies  Meds  Problems  Med Hx  Surg Hx  Fam Hx     Scaliness pinkness and excoriations from head to toe Scratching and rubbing at exam   Review of Systems: No other skin or systemic complaints except as noted in HPI or Assessment and Plan.  Objective  Well appearing patient in no apparent distress; mood and affect are within normal limits.  A focused examination was performed including b/l arms, back, buttocks, chest. Relevant physical exam findings are noted in the Assessment and Plan.  b/l arms, back, chest, buttocks Scaliness, pinkness and excoriations from head to toe               Assessment & Plan  Atopic dermatitis, with pruritus With head to toe crusted excoriations No response to topical steroids b/l arms, back, chest, buttocks Daughter reports patient has lewy body dementia   Patient actively scratching and rubbing during exam   Discussed Dupixent today and will recheck in 2 weeks for any improvement before submitting to insurance Start Tacrolimus 0.1 % ointment - apply topically to aa's of rash at body bid qd.   Dupixent 300 mg/ 2 ml  2 shots injected into patient's bilateral upper thighs.   [id number] Lot [id number] Exp 04/2024   Atopic dermatitis (eczema) is a chronic, relapsing, pruritic condition that can significantly affect quality of life. It is often associated with allergic rhinitis and/or asthma and can require treatment with topical medications, phototherapy, or in severe cases biologic injectable  medication (Dupixent; Adbry) or Oral JAK inhibitors.  Dupilumab (Dupixent) is a treatment given by injection for adults and children with moderate-to-severe atopic dermatitis. Goal is control of skin condition, not cure. It is given as 2 injections at the first dose followed by 1 injection ever 2 weeks thereafter.  Young children are dosed monthly.  Potential side effects include allergic reaction, herpes infections, injection site reactions and conjunctivitis (inflammation of the eyes).  The use of Dupixent requires long term medication management, including periodic office visits.  tacrolimus (PROTOPIC) 0.1 % ointment - b/l arms, back, chest, buttocks Apply topically 2 (two) times daily. Apply to aa's of rash at body  dupilumab (DUPIXENT) prefilled syringe 600 mg - b/l arms, back, chest, buttocks  Return in about 2 weeks (around 03/10/2022) for follow up on rash and dupixent . IRuthell Rummage, CMA, am acting as scribe for Sarina Ser, MD. Documentation: I have reviewed the above documentation for accuracy and completeness, and I agree with the above.  Sarina Ser, MD

## 2022-02-24 NOTE — Patient Instructions (Addendum)
 Dupilumab (Dupixent) is a treatment given by injection for adults and children with moderate-to-severe atopic dermatitis. Goal is control of skin condition, not cure. It is given as 2 injections at the first dose followed by 1 injection ever 2 weeks thereafter.  Young children are dosed monthly.  Potential side effects include allergic reaction, herpes infections, injection site reactions and conjunctivitis (inflammation of the eyes).  The use of Dupixent requires long term medication management, including periodic office visits.;   If You Need Anything After Your Visit  If you have any questions or concerns for your doctor, please call our main line at [phone] and press option 4 to reach your doctor's medical assistant. If no one answers, please leave a voicemail as directed and we will return your call as soon as possible. Messages left after 4 pm will be answered the following business day.   You may also send us a message via MyChart. We typically respond to MyChart messages within 1-2 business days.  For prescription refills, please ask your pharmacy to contact our office. Our fax number is [phone].  If you have an urgent issue when the clinic is closed that cannot wait until the next business day, you can page your doctor at the number below.    Please note that while we do our best to be available for urgent issues outside of office hours, we are not available 24/7.   If you have an urgent issue and are unable to reach us, you may choose to seek medical care at your doctor's office, retail clinic, urgent care center, or emergency room.  If you have a medical emergency, please immediately call 911 or go to the emergency department.  Pager Numbers  - Dr. Kowalski: [phone]  - Dr. Moye: [phone]  - Dr. Stewart: [phone]  In the event of inclement weather, please call our main line at [phone] for an update on the status of any delays or  closures.  Dermatology Medication Tips: Please keep the boxes that topical medications come in in order to help keep track of the instructions about where and how to use these. Pharmacies typically print the medication instructions only on the boxes and not directly on the medication tubes.   If your medication is too expensive, please contact our office at [phone] option 4 or send us a message through MyChart.   We are unable to tell what your co-pay for medications will be in advance as this is different depending on your insurance coverage. However, we may be able to find a substitute medication at lower cost or fill out paperwork to get insurance to cover a needed medication.   If a prior authorization is required to get your medication covered by your insurance company, please allow us 1-2 business days to complete this process.  Drug prices often vary depending on where the prescription is filled and some pharmacies may offer cheaper prices.  The website www.goodrx.com contains coupons for medications through different pharmacies. The prices here do not account for what the cost may be with help from insurance (it may be cheaper with your insurance), but the website can give you the price if you did not use any insurance.  - You can print the associated coupon and take it with your prescription to the pharmacy.  - You may also stop by our office during regular business hours and pick up a GoodRx coupon card.  - If you need your prescription sent electronically to a different   pharmacy, notify our office through [facility] MyChart or by phone at [phone] option 4.     Si Usted Necesita Algo Despus de Su Visita  Tambin puede enviarnos un mensaje a travs de MyChart. Por lo general respondemos a los mensajes de MyChart en el transcurso de 1 a 2 das hbiles.  Para renovar recetas, por favor pida a su farmacia que se ponga en contacto con nuestra oficina. Nuestro nmero de fax  es el [phone].  Si tiene un asunto urgente cuando la clnica est cerrada y que no puede esperar hasta el siguiente da hbil, puede llamar/localizar a su doctor(a) al nmero que aparece a continuacin.   Por favor, tenga en cuenta que aunque hacemos todo lo posible para estar disponibles para asuntos urgentes fuera del horario de oficina, no estamos disponibles las 24 horas del da, los 7 das de la semana.   Si tiene un problema urgente y no puede comunicarse con nosotros, puede optar por buscar atencin mdica  en el consultorio de su doctor(a), en una clnica privada, en un centro de atencin urgente o en una sala de emergencias.  Si tiene una emergencia mdica, por favor llame inmediatamente al 911 o vaya a la sala de emergencias.  Nmeros de bper  - Dr. Kowalski: [phone]  - Dra. Moye: [phone]  - Dra. Stewart: [phone]  En caso de inclemencias del tiempo, por favor llame a nuestra lnea principal al [phone] para una actualizacin sobre el estado de cualquier retraso o cierre.  Consejos para la medicacin en dermatologa: Por favor, guarde las cajas en las que vienen los medicamentos de uso tpico para ayudarle a seguir las instrucciones sobre dnde y cmo usarlos. Las farmacias generalmente imprimen las instrucciones del medicamento slo en las cajas y no directamente en los tubos del medicamento.   Si su medicamento es muy caro, por favor, pngase en contacto con nuestra oficina llamando al [phone] y presione la opcin 4 o envenos un mensaje a travs de MyChart.   No podemos decirle cul ser su copago por los medicamentos por adelantado ya que esto es diferente dependiendo de la cobertura de su seguro. Sin embargo, es posible que podamos encontrar un medicamento sustituto a menor costo o llenar un formulario para que el seguro cubra el medicamento que se considera necesario.   Si se requiere una autorizacin previa para que su compaa de seguros cubra  su medicamento, por favor permtanos de 1 a 2 das hbiles para completar este proceso.  Los precios de los medicamentos varan con frecuencia dependiendo del lugar de dnde se surte la receta y alguna farmacias pueden ofrecer precios ms baratos.  El sitio web www.goodrx.com tiene cupones para medicamentos de diferentes farmacias. Los precios aqu no tienen en cuenta lo que podra costar con la ayuda del seguro (puede ser ms barato con su seguro), pero el sitio web puede darle el precio si no utiliz ningn seguro.  - Puede imprimir el cupn correspondiente y llevarlo con su receta a la farmacia.  - Tambin puede pasar por nuestra oficina durante el horario de atencin regular y recoger una tarjeta de cupones de GoodRx.  - Si necesita que su receta se enve electrnicamente a una farmacia diferente, informe a nuestra oficina a travs de MyChart de [facility] o por telfono llamando al [phone] y presione la opcin 4.  

## 2022-02-28 ENCOUNTER — Encounter: Payer: Self-pay | Admitting: Dermatology

## 2022-03-01 ENCOUNTER — Telehealth: Payer: Self-pay | Admitting: Family Medicine

## 2022-03-01 NOTE — Telephone Encounter (Signed)
Copied from [place] [id number]. Topic: Medicare AWV >> Mar 01, 2022 12:38 PM Cher Nakai R wrote: Reason for CRM:  Phone not in service unable to leave a message for patient to call back and schedule Medicare Annual Wellness Visit (AWV) in office.   If unable to come into the office for AWV,  please offer to do virtually or by telephone.  Last AWV:  06/19/2020  Please schedule at anytime with [facility] Health Advisor.  30 minute appointment for Virtual or phone 45 minute appointment for in office or Initial virtual/phone  Any questions, please contact me at [phone]

## 2022-03-02 ENCOUNTER — Other Ambulatory Visit: Payer: Self-pay | Admitting: Family Medicine

## 2022-03-04 DIAGNOSIS — N1832 Chronic kidney disease, stage 3b: Secondary | ICD-10-CM | POA: Diagnosis not present

## 2022-03-04 DIAGNOSIS — Z794 Long term (current) use of insulin: Secondary | ICD-10-CM | POA: Diagnosis not present

## 2022-03-04 DIAGNOSIS — E1122 Type 2 diabetes mellitus with diabetic chronic kidney disease: Secondary | ICD-10-CM | POA: Diagnosis not present

## 2022-03-04 DIAGNOSIS — I10 Essential (primary) hypertension: Secondary | ICD-10-CM | POA: Diagnosis not present

## 2022-03-04 DIAGNOSIS — E1159 Type 2 diabetes mellitus with other circulatory complications: Secondary | ICD-10-CM | POA: Diagnosis not present

## 2022-03-04 DIAGNOSIS — E1129 Type 2 diabetes mellitus with other diabetic kidney complication: Secondary | ICD-10-CM | POA: Diagnosis not present

## 2022-03-04 DIAGNOSIS — R809 Proteinuria, unspecified: Secondary | ICD-10-CM | POA: Diagnosis not present

## 2022-03-08 DIAGNOSIS — Z794 Long term (current) use of insulin: Secondary | ICD-10-CM | POA: Diagnosis not present

## 2022-03-08 DIAGNOSIS — M25552 Pain in left hip: Secondary | ICD-10-CM | POA: Diagnosis not present

## 2022-03-08 DIAGNOSIS — E1165 Type 2 diabetes mellitus with hyperglycemia: Secondary | ICD-10-CM | POA: Diagnosis not present

## 2022-03-09 DIAGNOSIS — M25552 Pain in left hip: Secondary | ICD-10-CM | POA: Diagnosis not present

## 2022-03-10 ENCOUNTER — Ambulatory Visit: Payer: Medicare Other | Admitting: Dermatology

## 2022-03-10 ENCOUNTER — Encounter: Payer: Self-pay | Admitting: Dermatology

## 2022-03-10 DIAGNOSIS — L209 Atopic dermatitis, unspecified: Secondary | ICD-10-CM | POA: Diagnosis not present

## 2022-03-10 DIAGNOSIS — Z79899 Other long term (current) drug therapy: Secondary | ICD-10-CM

## 2022-03-10 MED ORDER — DUPIXENT 300 MG/2ML ~~LOC~~ SOAJ: 300.0000 mg | SUBCUTANEOUS | 5 refills | Status: DC

## 2022-03-10 MED ORDER — DUPILUMAB 300 MG/2ML ~~LOC~~ SOSY
300.0000 mg | PREFILLED_SYRINGE | Freq: Once | SUBCUTANEOUS | Status: AC
  Administered 2022-03-10: 300 mg via SUBCUTANEOUS

## 2022-03-10 NOTE — Patient Instructions (Signed)
Due to recent changes in healthcare laws, you may see results of your pathology and/or laboratory studies on MyChart before the doctors have had a chance to review them. We understand that in some cases there may be results that are confusing or concerning to you. Please understand that not all results are received at the same time and often the doctors may need to interpret multiple results in order to provide you with the best plan of care or course of treatment. Therefore, we ask that you please give us 2 business days to thoroughly review all your results before contacting the office for clarification. Should we see a critical lab result, you will be contacted sooner.   If You Need Anything After Your Visit  If you have any questions or concerns for your doctor, please call our main line at [phone] and press option 4 to reach your doctor's medical assistant. If no one answers, please leave a voicemail as directed and we will return your call as soon as possible. Messages left after 4 pm will be answered the following business day.   You may also send us a message via MyChart. We typically respond to MyChart messages within 1-2 business days.  For prescription refills, please ask your pharmacy to contact our office. Our fax number is [phone].  If you have an urgent issue when the clinic is closed that cannot wait until the next business day, you can page your doctor at the number below.    Please note that while we do our best to be available for urgent issues outside of office hours, we are not available 24/7.   If you have an urgent issue and are unable to reach us, you may choose to seek medical care at your doctor's office, retail clinic, urgent care center, or emergency room.  If you have a medical emergency, please immediately call 911 or go to the emergency department.  Pager Numbers  - Dr. Kowalski: [phone]  - Dr. Moye: [phone]  - Dr. Stewart:  [phone]  In the event of inclement weather, please call our main line at [phone] for an update on the status of any delays or closures.  Dermatology Medication Tips: Please keep the boxes that topical medications come in in order to help keep track of the instructions about where and how to use these. Pharmacies typically print the medication instructions only on the boxes and not directly on the medication tubes.   If your medication is too expensive, please contact our office at [phone] option 4 or send us a message through MyChart.   We are unable to tell what your co-pay for medications will be in advance as this is different depending on your insurance coverage. However, we may be able to find a substitute medication at lower cost or fill out paperwork to get insurance to cover a needed medication.   If a prior authorization is required to get your medication covered by your insurance company, please allow us 1-2 business days to complete this process.  Drug prices often vary depending on where the prescription is filled and some pharmacies may offer cheaper prices.  The website www.goodrx.com contains coupons for medications through different pharmacies. The prices here do not account for what the cost may be with help from insurance (it may be cheaper with your insurance), but the website can give you the price if you did not use any insurance.  - You can print the associated coupon and take it with   your prescription to the pharmacy.  - You may also stop by our office during regular business hours and pick up a GoodRx coupon card.  - If you need your prescription sent electronically to a different pharmacy, notify our office through [facility] MyChart or by phone at [phone] option 4.     Si Usted Necesita Algo Despus de Su Visita  Tambin puede enviarnos un mensaje a travs de MyChart. Por lo general respondemos a los mensajes de MyChart en el transcurso de 1 a 2  das hbiles.  Para renovar recetas, por favor pida a su farmacia que se ponga en contacto con nuestra oficina. Nuestro nmero de fax es el [phone].  Si tiene un asunto urgente cuando la clnica est cerrada y que no puede esperar hasta el siguiente da hbil, puede llamar/localizar a su doctor(a) al nmero que aparece a continuacin.   Por favor, tenga en cuenta que aunque hacemos todo lo posible para estar disponibles para asuntos urgentes fuera del horario de oficina, no estamos disponibles las 24 horas del da, los 7 das de la semana.   Si tiene un problema urgente y no puede comunicarse con nosotros, puede optar por buscar atencin mdica  en el consultorio de su doctor(a), en una clnica privada, en un centro de atencin urgente o en una sala de emergencias.  Si tiene una emergencia mdica, por favor llame inmediatamente al 911 o vaya a la sala de emergencias.  Nmeros de bper  - Dr. Kowalski: [phone]  - Dra. Moye: [phone]  - Dra. Stewart: [phone]  En caso de inclemencias del tiempo, por favor llame a nuestra lnea principal al [phone] para una actualizacin sobre el estado de cualquier retraso o cierre.  Consejos para la medicacin en dermatologa: Por favor, guarde las cajas en las que vienen los medicamentos de uso tpico para ayudarle a seguir las instrucciones sobre dnde y cmo usarlos. Las farmacias generalmente imprimen las instrucciones del medicamento slo en las cajas y no directamente en los tubos del medicamento.   Si su medicamento es muy caro, por favor, pngase en contacto con nuestra oficina llamando al [phone] y presione la opcin 4 o envenos un mensaje a travs de MyChart.   No podemos decirle cul ser su copago por los medicamentos por adelantado ya que esto es diferente dependiendo de la cobertura de su seguro. Sin embargo, es posible que podamos encontrar un medicamento sustituto a menor costo o llenar un formulario para que el  seguro cubra el medicamento que se considera necesario.   Si se requiere una autorizacin previa para que su compaa de seguros cubra su medicamento, por favor permtanos de 1 a 2 das hbiles para completar este proceso.  Los precios de los medicamentos varan con frecuencia dependiendo del lugar de dnde se surte la receta y alguna farmacias pueden ofrecer precios ms baratos.  El sitio web www.goodrx.com tiene cupones para medicamentos de diferentes farmacias. Los precios aqu no tienen en cuenta lo que podra costar con la ayuda del seguro (puede ser ms barato con su seguro), pero el sitio web puede darle el precio si no utiliz ningn seguro.  - Puede imprimir el cupn correspondiente y llevarlo con su receta a la farmacia.  - Tambin puede pasar por nuestra oficina durante el horario de atencin regular y recoger una tarjeta de cupones de GoodRx.  - Si necesita que su receta se enve electrnicamente a una farmacia diferente, informe a nuestra oficina a travs de MyChart de [place]   o por telfono llamando al [phone] y presione la opcin 4.  

## 2022-03-10 NOTE — Progress Notes (Signed)
   Follow-Up Visit   Subjective  [name] is a 88 y.o. female who presents for the following: Atopic dermatitis (Patient has noticed an improvement in itching since starting Dupixent loading dose but felt that Tacrolimus made her skin itchy so she stopped using it. She is here today for another Dupixent injection and to follow up on condition). Patient's son is present with her and contributes to history.  The following portions of the chart were reviewed this encounter and updated as appropriate:   Tobacco  Allergies  Meds  Problems  Med Hx  Surg Hx  Fam Hx     Review of Systems:  No other skin or systemic complaints except as noted in HPI or Assessment and Plan.  Objective  Well appearing patient in no apparent distress; mood and affect are within normal limits. Scattered excoriations on the chest.  Generalized xerosis.  Overall much improved.  A focused examination was performed including the face, trunk, and extremities. Relevant physical exam findings are noted in the Assessment and Plan.   Assessment & Plan  Atopic dermatitis, severe With severe pruritus and excoriations much improved after 2 weeks since Dupixent injection. Face, trunk, extremities  Chronic and persistent condition with duration or expected duration over one year. Condition is symptomatic / bothersome to patient. Not to goal.  Atopic dermatitis - Severe, on Dupixent (biologic medication).  Atopic dermatitis (eczema) is a chronic, relapsing, pruritic condition that can significantly affect quality of life. It is often associated with allergic rhinitis and/or asthma and can require treatment with topical medications, phototherapy, or in severe cases a biologic medication called Dupixent, which requires long term medication management.   Continue Dupixent '300mg'$ /57m SQ QOW. Dupilumab (Dupixent) is a treatment given by injection for adults and children with moderate-to-severe atopic dermatitis. Goal is  control of skin condition, not cure. It is given as 2 injections at the first dose followed by 1 injection ever 2 weeks thereafter.  Young children are dosed monthly.  Potential side effects include allergic reaction, herpes infections, injection site reactions and conjunctivitis (inflammation of the eyes).  The use of Dupixent requires long term medication management, including periodic office visits.  D/C Tacrolimus since patient feels that it made her skin itchy. She has also tried and failed TMC.   Dupilumab (DUPIXENT) 300 MG/2ML SOPN - Face, trunk, extremities Inject 300 mg into the skin every 14 (fourteen) days. Starting at day 15 for maintenance.  Related Medications tacrolimus (PROTOPIC) 0.1 % ointment Apply topically 2 (two) times daily. Apply to aa's of rash at body   Return in about 2 weeks (around 03/24/2022) for nurse visit - Dupixent injection, 6 weeks for follow up with Dr. KNehemiah Massed  ILuther Redo CMA, am acting as scribe for DSarina Ser MD . Documentation: I have reviewed the above documentation for accuracy and completeness, and I agree with the above.  DSarina Ser MD

## 2022-03-24 ENCOUNTER — Ambulatory Visit (INDEPENDENT_AMBULATORY_CARE_PROVIDER_SITE_OTHER): Payer: Medicare Other | Admitting: Dermatology

## 2022-03-24 DIAGNOSIS — L209 Atopic dermatitis, unspecified: Secondary | ICD-10-CM | POA: Diagnosis not present

## 2022-03-24 MED ORDER — DUPILUMAB 300 MG/2ML ~~LOC~~ SOSY
300.0000 mg | PREFILLED_SYRINGE | Freq: Once | SUBCUTANEOUS | Status: AC
  Administered 2022-03-24: 300 mg via SUBCUTANEOUS

## 2022-03-24 NOTE — Progress Notes (Signed)
Patient here today for a 2 week Dupixent injection for severe Atopic Dermatitis.   Dupixent '300mg'$ /30m injected into left upper arm. Patient tolerated procedure well.   Lot#: [id number]: 06/2024  ADicie BeamRMA  Documentation: I have reviewed the above documentation for accuracy and completeness, and I agree with the above.  VForest Gleason MD

## 2022-03-29 ENCOUNTER — Ambulatory Visit: Payer: Self-pay

## 2022-03-29 DIAGNOSIS — Z794 Long term (current) use of insulin: Secondary | ICD-10-CM

## 2022-03-29 NOTE — Chronic Care Management (AMB) (Signed)
Chronic Care Management   CCM RN Visit Note  03/29/2022 Name: [name] MRN: [id number] DOB: 1929/06/25  Subjective: [name] is a 88 y.o. year old female who is a primary care patient of Tammy Banana., MD. The care management team was consulted for assistance with disease management and care coordination needs.    Engaged with patient's son [name] by telephone for follow up visit in response to provider referral for case management and care coordination services.   Consent to Services:  The patient was given information about Chronic Care Management services, agreed to services, and gave verbal consent prior to initiation of services.  Please see initial visit note for detailed documentation.   Assessment: Review of patient past medical history, allergies, medications, health status, including review of consultants reports, laboratory and other test data, was performed as part of comprehensive evaluation and provision of chronic care management services.   SDOH (Social Determinants of Health) assessments and interventions performed: No  CCM Care Plan  Allergies  Allergen Reactions   Bacitracin-Neomycin-Polymyxin Rash   Neomycin-Bacitracin Zn-Polymyx Swelling and Rash   Latex Rash   Lidocaine Rash   Aricept [Donepezil Hcl] Diarrhea   Benzalkonium Chloride Itching and Swelling   Ibuprofen     Other reaction(s): Dizziness Heart fluttering. Tachycardia. Tachycardia.   Lidocaine Hcl Itching    Per pt, "all caine meds cause severe itching"   Valdecoxib Nausea And Vomiting   Albuterol Rash   Tape Rash    blisters   Triamcinolone Rash    Outpatient Encounter Medications as of 03/29/2022  Medication Sig   ADVAIR HFA 230-21 MCG/ACT inhaler TAKE 2 PUFFS INTO LUNGS TWICE A DAY (Patient not taking: Reported on 01/06/2022)   albuterol (VENTOLIN HFA) 108 (90 Base) MCG/ACT inhaler INHALE 2 PUFFS INTO THE LUNGS EVERY 4 HOURS AS NEEDED FOR WHEEZING ORSHORTNESS  OF BREATH (Patient not taking: Reported on 01/06/2022)   aspirin 325 MG tablet Take 325 mg by mouth daily. Reported on 01/05/2016   atorvastatin (LIPITOR) 10 MG tablet TAKE 1 TABLET BY MOUTH ONCE DAILY.   cetirizine (ZYRTEC) 10 MG tablet TAKE ONE TABLET BY MOUTH EVERY DAY   cholecalciferol (VITAMIN D3) 25 MCG (1000 UT) tablet Take 1,000 Units by mouth daily.   clopidogrel (PLAVIX) 75 MG tablet TAKE 1 TABLET BY MOUTH DAILY   diltiazem (TIADYLT ER) 360 MG 24 hr capsule Hold until followup with your outpatient doctor due to your blood pressure on the low side.   Dupilumab (DUPIXENT) 300 MG/2ML SOPN Inject 300 mg into the skin every 14 (fourteen) days. Starting at day 15 for maintenance.   ezetimibe (ZETIA) 10 MG tablet TAKE 1 TABLET BY MOUTH DAILY   FLUoxetine (PROZAC) 20 MG capsule Take 1 capsule (20 mg total) by mouth daily.   glucose blood test strip ACCU-CHEK AVIVA PLUS TEST STRP Use to check sugars 3 times daily.   HUMALOG KWIKPEN 100 UNIT/ML KwikPen Inject into the skin.   insulin glargine (LANTUS) 100 UNIT/ML injection Inject 0.05 mLs (5 Units total) into the skin at bedtime.   insulin lispro (HUMALOG) 100 UNIT/ML KiwkPen Inject 10 Units into the skin 3 (three) times daily. 10 units before breakfast, 8 before lunch and 18 before supper   iron polysaccharides (NIFEREX) 150 MG capsule Take 1 capsule (150 mg total) by mouth daily.   isosorbide mononitrate (IMDUR) 30 MG 24 hr tablet Take 30 mg by mouth daily.   lansoprazole (PREVACID) 30 MG capsule TAKE 1 CAPSULE BY  MOUTH ONCE DAILY AT NOON   losartan (COZAAR) 100 MG tablet TAKE 1 TABLET BY MOUTH DAILY   magnesium oxide (MAG-OX) 400 MG tablet Take 400 mg by mouth daily.   meloxicam (MOBIC) 7.5 MG tablet Take 1 tablet (7.5 mg total) by mouth daily as needed for pain.   memantine (NAMENDA) 10 MG tablet Take 10 mg by mouth 2 (two) times daily.   montelukast (SINGULAIR) 10 MG tablet TAKE ONE TABLET AT BEDTIME   ondansetron (ZOFRAN-ODT) 4 MG  disintegrating tablet Take 4 mg by mouth every 8 (eight) hours as needed for nausea. For up to 12 doses. (Patient not taking: Reported on 03/10/2022)   pantoprazole (PROTONIX) 40 MG tablet Take 40 mg by mouth daily.   QUEtiapine (SEROQUEL) 50 MG tablet Take 50 mg by mouth at bedtime.   tacrolimus (PROTOPIC) 0.1 % ointment Apply topically 2 (two) times daily. Apply to aa's of rash at body   traZODone (DESYREL) 50 MG tablet TAKE 1 AND 1/2 TABLET AT BEDTIME AS NEEDED FOR SLEEP   TUSSIN DM 100-10 MG/5ML liquid TAKE 1 TEASPOONFUL BY MOUTH EVERY 4 HOURS AS NEEDED FOR COUGH (Patient not taking: Reported on 03/10/2022)   vitamin B-12 1000 MCG tablet Take 1 tablet (1,000 mcg total) by mouth daily.   vitamin E 1000 UNIT capsule Take 1,000 Units by mouth daily.   No facility-administered encounter medications on file as of 03/29/2022.    Patient Active Problem List   Diagnosis Date Noted   Atherosclerosis of native arteries of the extremities with ulceration ([place]) 04/07/2021   PSVT (paroxysmal supraventricular tachycardia) ([place]) 78/24/2353   Acute metabolic encephalopathy 61/44/3154   Frequent falls 02/10/2021   COVID-19 virus infection 02/10/2021   Chronic kidney disease, stage 3a ([place]) 02/10/2021   Dementia ([place]) 02/10/2021   Garbled speech, intermittent 02/10/2021   Laceration without foreign body, left ankle, subsequent encounter 02/10/2021   AMS (altered mental status) 02/10/2021   Coronary artery disease involving native coronary artery of native heart 04/17/2020   Celiac artery stenosis ([place]) 06/12/2019   Bilateral lower abdominal cramping 04/20/2019   UTI symptoms 04/20/2019   History of rectal bleeding 03/26/2019   DM type 2 with diabetic peripheral neuropathy ([place]) 12/14/2018   Arthropathy of lumbar facet joint 09/04/2018   Degeneration of lumbar intervertebral disc 09/04/2018   Spondylolisthesis, grade 1 09/04/2018   Lewy body dementia ([place]) 02/17/2018   PVD (peripheral vascular  disease) ([place]) 08/16/2017   Pain in limb 07/27/2016   Hallucinations 07/07/2016   Chest pain 07/01/2016   Depression 06/23/2016   Hypokalemia 09/03/2015   Insomnia 07/23/2015   Headache 07/23/2015   Type 2 diabetes mellitus with vascular disease ([place]) 06/24/2015   Chronic vulvitis 06/02/2015   Allergic rhinitis 05/30/2015   Anemia 05/30/2015   Back ache 05/30/2015   Body mass index (BMI) of 29.0-29.9 in adult 05/30/2015   Edema 05/30/2015   Dilatation of esophagus 05/30/2015   Fall 05/30/2015   H/O deep venous thrombosis 05/30/2015   Hypercholesteremia 05/30/2015   Heart & renal disease, hypertensive, with heart failure ([place]) 05/30/2015   Hyponatremia 05/30/2015   Calculus of kidney 05/30/2015   Gonalgia 05/30/2015   Psoriasis 05/30/2015   Avitaminosis D 05/30/2015   Cough 04/01/2015   Combined fat and carbohydrate induced hyperlipemia 12/30/2014   Paroxysmal atrial fibrillation ([place]) 07/10/2014   Abnormal gait 07/08/2014   Anxiety state 07/08/2014   Chronic kidney disease 07/08/2014   Acid reflux 07/08/2014   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[place]) 07/08/2014  Chronic obstructive pulmonary disease ([place]) 07/08/2014   Type II diabetes mellitus with renal manifestations ([place]) 07/08/2014   Benign essential HTN 06/26/2014   Carotid artery narrowing 06/26/2014   Myalgia 06/26/2014   Basal cell carcinoma of face 05/01/2014   Disease of female genital organs 11/28/2012   Incomplete bladder emptying 09/05/2012   Excessive urination at night 08/15/2012   Basal cell carcinoma of ear 10/26/2011     Brief outreach with patient's son [name]. Reports Mrs. Kage  is currently out of town but has been doing well.  A member of the care management team will reach out when she returns.   [facility] [phone]

## 2022-03-30 ENCOUNTER — Encounter: Payer: Self-pay | Admitting: Dermatology

## 2022-04-05 ENCOUNTER — Other Ambulatory Visit: Payer: Self-pay | Admitting: Family Medicine

## 2022-04-05 DIAGNOSIS — E7800 Pure hypercholesterolemia, unspecified: Secondary | ICD-10-CM

## 2022-04-06 ENCOUNTER — Ambulatory Visit: Payer: Self-pay

## 2022-04-07 ENCOUNTER — Ambulatory Visit: Payer: Medicare Other

## 2022-04-08 DIAGNOSIS — Z794 Long term (current) use of insulin: Secondary | ICD-10-CM | POA: Diagnosis not present

## 2022-04-08 DIAGNOSIS — E1165 Type 2 diabetes mellitus with hyperglycemia: Secondary | ICD-10-CM | POA: Diagnosis not present

## 2022-04-09 NOTE — Progress Notes (Unsigned)
I,Jana Robinson,acting as a scribe for Wilhemena Durie, MD.,have documented all relevant documentation on the behalf of Wilhemena Durie, MD,as directed by  Wilhemena Durie, MD while in the presence of Wilhemena Durie, MD.   Established patient visit   Patient: [name]   DOB: June 29, 1929   88 y.o. Female  MRN: [id number] Visit Date: 04/12/2022  Today's healthcare provider: Wilhemena Durie, MD   Chief Complaint  Patient presents with   Hypertension    Dementia with anxiety    Hyperlipidemia   Subjective    HPI Patient is in better spirits since she has been living independently in her own home.  She has not had hallucinations recently.  She recently spent some time with a couple of sisters who live in [place] and she really enjoyed this.  She has had no recent falls. Since she got back from [place] she feels that she is little more depressed being at home.  It is not bad but she wonders about increasing her medications a little bit. She also request a refill on her albuterol inhaler HPI     Hypertension    Additional comments: Dementia with anxiety       Last edited by Alanson Puls, CMA on 04/12/2022 10:37 AM.      Hypertension, follow-up  BP Readings from Last 3 Encounters:  04/12/22 122/86  01/27/22 137/84  01/06/22 (!) 144/72   Wt Readings from Last 3 Encounters:  04/12/22 133 lb 14.4 oz (60.7 kg)  01/26/22 130 lb (59 kg)  01/06/22 133 lb 3.2 oz (60.4 kg)     She was last seen for hypertension 3 months ago.  Management since that visit includes; advised to check home blood pressures.  Outside blood pressures are: son reports normal BP at all patients doctor visits. Does not do readings at home.    --------------------------------------------------------------------------------------------------- Lipid/Cholesterol, follow-up  Last Lipid Panel: Lab Results  Component Value Date   CHOL 118 05/08/2021   LDLCALC 41  05/08/2021   HDL 44 05/08/2021   TRIG 209 (H) 05/08/2021    She was last seen for this 11 months ago.  Management since that visit includes; taking atorvastatin 10 mg and Zetia 10 mg.  Last metabolic panel Lab Results  Component Value Date   GLUCOSE 256 (H) 01/26/2022   NA 136 01/26/2022   K 4.5 01/26/2022   BUN 31 (H) 01/26/2022   CREATININE 1.13 (H) 01/26/2022   EGFR 58 (L) 05/08/2021   GFRNONAA 46 (L) 01/26/2022   CALCIUM 8.8 (L) 01/26/2022   AST 17 01/26/2022   ALT 14 01/26/2022   The ASCVD Risk score (Arnett DK, et al., 2019) failed to calculate for the following reasons:   The 2019 ASCVD risk score is only valid for ages 40 to 31  --------------------------------------------------------------------------------------------------- Follow up for Moderate Lewy body dementia with anxiety [facility]):  The patient was last seen for this 3 months ago. Changes made at last visit include; Patient has appointment for psychiatry and for neurology in the next week.  Per son, patients medication was increased at visit and he reports improvement.  Son states he thinks it is the Prozac 20 mg.    -----------------------------------------------------------------------------------------   Medications: Outpatient Medications Prior to Visit  Medication Sig   ADVAIR HFA 230-21 MCG/ACT inhaler TAKE 2 PUFFS INTO LUNGS TWICE A DAY   aspirin 325 MG tablet Take 325 mg by mouth daily. Reported on 01/05/2016   atorvastatin (LIPITOR)  10 MG tablet TAKE 1 TABLET BY MOUTH ONCE DAILY.   cetirizine (ZYRTEC) 10 MG tablet TAKE ONE TABLET BY MOUTH EVERY DAY   cholecalciferol (VITAMIN D3) 25 MCG (1000 UT) tablet Take 1,000 Units by mouth daily.   clopidogrel (PLAVIX) 75 MG tablet TAKE 1 TABLET BY MOUTH DAILY   diltiazem (TIADYLT ER) 360 MG 24 hr capsule Hold until followup with your outpatient doctor due to your blood pressure on the low side.   Dupilumab (DUPIXENT) 300 MG/2ML SOPN Inject 300 mg into the skin  every 14 (fourteen) days. Starting at day 15 for maintenance.   ezetimibe (ZETIA) 10 MG tablet TAKE 1 TABLET BY MOUTH DAILY   glucose blood test strip ACCU-CHEK AVIVA PLUS TEST STRP Use to check sugars 3 times daily.   HUMALOG KWIKPEN 100 UNIT/ML KwikPen Inject into the skin.   insulin glargine (LANTUS) 100 UNIT/ML injection Inject 0.05 mLs (5 Units total) into the skin at bedtime.   insulin lispro (HUMALOG) 100 UNIT/ML KiwkPen Inject 10 Units into the skin 3 (three) times daily. 10 units before breakfast, 8 before lunch and 18 before supper   isosorbide mononitrate (IMDUR) 30 MG 24 hr tablet Take 30 mg by mouth daily.   lansoprazole (PREVACID) 30 MG capsule TAKE 1 CAPSULE BY MOUTH ONCE DAILY AT NOON   losartan (COZAAR) 100 MG tablet TAKE 1 TABLET BY MOUTH DAILY   magnesium oxide (MAG-OX) 400 MG tablet Take 400 mg by mouth daily.   meloxicam (MOBIC) 7.5 MG tablet Take 1 tablet (7.5 mg total) by mouth daily as needed for pain.   memantine (NAMENDA) 10 MG tablet Take 10 mg by mouth 2 (two) times daily.   montelukast (SINGULAIR) 10 MG tablet TAKE ONE TABLET AT BEDTIME   ondansetron (ZOFRAN-ODT) 4 MG disintegrating tablet Take 4 mg by mouth every 8 (eight) hours as needed for nausea. For up to 12 doses.   pantoprazole (PROTONIX) 40 MG tablet Take 40 mg by mouth daily.   QUEtiapine (SEROQUEL) 50 MG tablet Take 50 mg by mouth at bedtime.   tacrolimus (PROTOPIC) 0.1 % ointment Apply topically 2 (two) times daily. Apply to aa's of rash at body   traZODone (DESYREL) 50 MG tablet TAKE 1 AND 1/2 TABLET AT BEDTIME AS NEEDED FOR SLEEP   TUSSIN DM 100-10 MG/5ML liquid TAKE 1 TEASPOONFUL BY MOUTH EVERY 4 HOURS AS NEEDED FOR COUGH   vitamin B-12 1000 MCG tablet Take 1 tablet (1,000 mcg total) by mouth daily.   vitamin E 1000 UNIT capsule Take 1,000 Units by mouth daily.   [DISCONTINUED] albuterol (VENTOLIN HFA) 108 (90 Base) MCG/ACT inhaler INHALE 2 PUFFS INTO THE LUNGS EVERY 4 HOURS AS NEEDED FOR WHEEZING  ORSHORTNESS OF BREATH   [DISCONTINUED] FLUoxetine (PROZAC) 20 MG capsule Take 1 capsule (20 mg total) by mouth daily.   iron polysaccharides (NIFEREX) 150 MG capsule Take 1 capsule (150 mg total) by mouth daily.   No facility-administered medications prior to visit.    Review of Systems  Constitutional:  Negative for appetite change, chills, fatigue and fever.  Respiratory:  Negative for chest tightness and shortness of breath.   Cardiovascular:  Negative for chest pain and palpitations.  Gastrointestinal:  Negative for abdominal pain, nausea and vomiting.  Neurological:  Negative for dizziness and weakness.    Last CBC Lab Results  Component Value Date   WBC 10.1 01/26/2022   HGB 10.3 (L) 01/26/2022   HCT 33.5 (L) 01/26/2022   MCV 78.3 (L) 01/26/2022  MCH 24.1 (L) 01/26/2022   RDW 18.5 (H) 01/26/2022   PLT 301 01/26/2022       Objective    BP 122/86 (BP Location: Right Arm, Patient Position: Sitting, Cuff Size: Normal)   Pulse 77   Temp 97.6 F (36.4 C) (Oral)   Resp 14   Wt 133 lb 14.4 oz (60.7 kg)   SpO2 98%   BMI 24.49 kg/m  BP Readings from Last 3 Encounters:  04/12/22 122/86  01/27/22 137/84  01/06/22 (!) 144/72   Wt Readings from Last 3 Encounters:  04/12/22 133 lb 14.4 oz (60.7 kg)  01/26/22 130 lb (59 kg)  01/06/22 133 lb 3.2 oz (60.4 kg)      Physical Exam Vitals reviewed.  Constitutional:      Appearance: She is well-developed.  HENT:     Head: Normocephalic and atraumatic.  Eyes:     General: No scleral icterus.    Conjunctiva/sclera: Conjunctivae normal.  Neck:     Thyroid: No thyromegaly.     Vascular: No carotid bruit.     Comments: She has bilateral carotid bruit. Cardiovascular:     Rate and Rhythm: Normal rate and regular rhythm.  Pulmonary:     Effort: Pulmonary effort is normal.  Abdominal:     Palpations: Abdomen is soft.  Skin:    General: Skin is warm and dry.     Findings: Laceration present.     Comments: Very fair  skin.   Neurological:     Mental Status: She is alert. Mental status is at baseline.  Psychiatric:        Mood and Affect: Mood normal.        Behavior: Behavior normal.       No results found for any visits on 04/12/22.  Assessment & Plan     1. Hypercholesteremia On atorvastatin 10  2. Benign essential HTN Excellent blood pressure control.  3. Type 2 diabetes mellitus with diabetic nephropathy, with long-term current use of insulin ([place]) Followed by endocrinology  4. Coronary artery disease of native artery of native heart with stable angina pectoris ([place]) All risk factors treated  5. PVD (peripheral vascular disease) ([place])   6. Chronic obstructive pulmonary disease, unspecified COPD type (HCC) Refill albuterol MDI  7. Moderate Lewy body dementia with anxiety ([place]) Also followed by neurology and stable presently  8. Paroxysmal atrial fibrillation (HCC)   9. Bronchiectasis without complication (HCC)  - albuterol (VENTOLIN HFA) 108 (90 Base) MCG/ACT inhaler; INHALE 2 PUFFS INTO THE LUNGS EVERY 4 HOURS AS NEEDED FOR WHEEZING ORSHORTNESS OF BREATH  Dispense: 8.5 g; Refill: 10  10. Mucopurulent chronic bronchitis (HCC)  - albuterol (VENTOLIN HFA) 108 (90 Base) MCG/ACT inhaler; INHALE 2 PUFFS INTO THE LUNGS EVERY 4 HOURS AS NEEDED FOR WHEEZING ORSHORTNESS OF BREATH  Dispense: 8.5 g; Refill: 10  11. Episode of recurrent major depressive disorder, unspecified depression episode severity (HCC) Increase slowly fluoxetine from 20 mg to 30 mg daily and follow-up in 2 to 3 months - FLUoxetine (PROZAC) 10 MG capsule; Take 1 capsule (10 mg total) by mouth 3 (three) times daily.  Dispense: 90 capsule; Refill: 3   Return in about 3 months (around 07/13/2022).      I, Wilhemena Durie, MD, have reviewed all documentation for this visit. The documentation on 04/13/22 for the exam, diagnosis, procedures, and orders are all accurate and complete.    Wilhemena Durie, MD   [facility] [phone] (phone) [phone] (fax)  [place]

## 2022-04-12 ENCOUNTER — Ambulatory Visit (INDEPENDENT_AMBULATORY_CARE_PROVIDER_SITE_OTHER): Payer: Medicare Other | Admitting: Family Medicine

## 2022-04-12 ENCOUNTER — Encounter: Payer: Self-pay | Admitting: Family Medicine

## 2022-04-12 VITALS — BP 122/86 | HR 77 | Temp 97.6°F | Resp 14 | Wt 133.9 lb

## 2022-04-12 DIAGNOSIS — I739 Peripheral vascular disease, unspecified: Secondary | ICD-10-CM | POA: Diagnosis not present

## 2022-04-12 DIAGNOSIS — E1121 Type 2 diabetes mellitus with diabetic nephropathy: Secondary | ICD-10-CM | POA: Diagnosis not present

## 2022-04-12 DIAGNOSIS — J449 Chronic obstructive pulmonary disease, unspecified: Secondary | ICD-10-CM | POA: Diagnosis not present

## 2022-04-12 DIAGNOSIS — E7800 Pure hypercholesterolemia, unspecified: Secondary | ICD-10-CM

## 2022-04-12 DIAGNOSIS — G3183 Dementia with Lewy bodies: Secondary | ICD-10-CM

## 2022-04-12 DIAGNOSIS — J479 Bronchiectasis, uncomplicated: Secondary | ICD-10-CM | POA: Diagnosis not present

## 2022-04-12 DIAGNOSIS — I25118 Atherosclerotic heart disease of native coronary artery with other forms of angina pectoris: Secondary | ICD-10-CM | POA: Diagnosis not present

## 2022-04-12 DIAGNOSIS — I480 Paroxysmal atrial fibrillation: Secondary | ICD-10-CM

## 2022-04-12 DIAGNOSIS — I10 Essential (primary) hypertension: Secondary | ICD-10-CM

## 2022-04-12 DIAGNOSIS — R269 Unspecified abnormalities of gait and mobility: Secondary | ICD-10-CM

## 2022-04-12 DIAGNOSIS — Z794 Long term (current) use of insulin: Secondary | ICD-10-CM

## 2022-04-12 DIAGNOSIS — F02B4 Dementia in other diseases classified elsewhere, moderate, with anxiety: Secondary | ICD-10-CM

## 2022-04-12 DIAGNOSIS — J411 Mucopurulent chronic bronchitis: Secondary | ICD-10-CM | POA: Diagnosis not present

## 2022-04-12 DIAGNOSIS — F339 Major depressive disorder, recurrent, unspecified: Secondary | ICD-10-CM

## 2022-04-12 MED ORDER — ALBUTEROL SULFATE HFA 108 (90 BASE) MCG/ACT IN AERS: INHALATION_SPRAY | RESPIRATORY_TRACT | 10 refills | Status: DC

## 2022-04-12 MED ORDER — FLUOXETINE HCL 10 MG PO CAPS: 10.0000 mg | ORAL_CAPSULE | Freq: Three times a day (TID) | ORAL | 3 refills | Status: DC

## 2022-04-21 ENCOUNTER — Ambulatory Visit: Payer: Medicare Other | Admitting: Dermatology

## 2022-04-30 ENCOUNTER — Other Ambulatory Visit: Payer: Self-pay | Admitting: Family Medicine

## 2022-04-30 DIAGNOSIS — F339 Major depressive disorder, recurrent, unspecified: Secondary | ICD-10-CM

## 2022-04-30 NOTE — Telephone Encounter (Signed)
Requested Prescriptions  Pending Prescriptions Disp Refills  . ezetimibe (ZETIA) 10 MG tablet [Pharmacy Med Name: EZETIMIBE 10 MG TAB] 90 tablet 1    Sig: TAKE 1 TABLET BY MOUTH DAILY     Cardiovascular:  Antilipid - Sterol Transport Inhibitors Failed - 04/30/2022  3:02 PM      Failed - Lipid Panel in normal range within the last 12 months    Cholesterol, Total  Date Value Ref Range Status  05/08/2021 118 100 - 199 mg/dL Final   LDL Cholesterol (Calc)  Date Value Ref Range Status  09/13/2017 104 (H) mg/dL (calc) Final    Comment:    Reference range: <100 . Desirable range <100 mg/dL for primary prevention;   <70 mg/dL for patients with CHD or diabetic patients  with > or = 2 CHD risk factors. Marland Kitchen LDL-C is now calculated using the Martin-Hopkins  calculation, which is a validated novel method providing  better accuracy than the Friedewald equation in the  estimation of LDL-C.  Cresenciano Genre et al. Annamaria Helling. 7026;378(58): 2061-2068  (http://education.[employer].com/faq/FAQ164)    LDL Chol Calc (NIH)  Date Value Ref Range Status  05/08/2021 41 0 - 99 mg/dL Final   HDL  Date Value Ref Range Status  05/08/2021 44 >39 mg/dL Final   Triglycerides  Date Value Ref Range Status  05/08/2021 209 (H) 0 - 149 mg/dL Final         Passed - AST in normal range and within 360 days    AST  Date Value Ref Range Status  01/26/2022 17 15 - 41 U/L Final   SGOT(AST)  Date Value Ref Range Status  02/06/2012 22 15 - 37 Unit/L Final         Passed - ALT in normal range and within 360 days    ALT  Date Value Ref Range Status  01/26/2022 14 0 - 44 U/L Final   SGPT (ALT)  Date Value Ref Range Status  02/06/2012 20 U/L Final    Comment:    12-78 NOTE: NEW REFERENCE RANGE 08/20/2011          Passed - Patient is not pregnant      Passed - Valid encounter within last 12 months    Recent Outpatient Visits          2 weeks ago [place] Jerrol Banana., MD   3 months ago Moderate Lewy body dementia with anxiety [facility])   [facility] Jerrol Banana., MD   9 months ago Moderate Lewy body dementia with anxiety [facility])   [facility] Jerrol Banana., MD   10 months ago Late onset Alzheimer's dementia without behavioral disturbance [facility])   [facility] Jerrol Banana., MD   11 months ago Benign essential HTN   [facility] Jerrol Banana., MD      Future Appointments            In 2 months Jerrol Banana., MD [facility], [place]           . FLUoxetine (PROZAC) 10 MG capsule [Pharmacy Med Name: FLUOXETINE HCL 10 MG CAP] 90 capsule 3    Sig: TAKE 1 CAPSULE BY MOUTH 3 TIMES DAILY.     Psychiatry:  Antidepressants - SSRI Failed - 04/30/2022  3:02 PM      Failed - Completed PHQ-2 or PHQ-9 in the last 360 days  Passed - Valid encounter within last 6 months    Recent Outpatient Visits          2 weeks ago [place] Jerrol Banana., MD   3 months ago Moderate Lewy body dementia with anxiety [facility])   [facility] Jerrol Banana., MD   9 months ago Moderate Lewy body dementia with anxiety [facility])   [facility] Jerrol Banana., MD   10 months ago Late onset Alzheimer's dementia without behavioral disturbance [facility])   [facility] Jerrol Banana., MD   11 months ago Benign essential HTN   [facility] Jerrol Banana., MD      Future Appointments            In 2 months Jerrol Banana., MD [facility], [place] [facility] [place]

## 2022-05-04 ENCOUNTER — Ambulatory Visit: Payer: Self-pay

## 2022-05-04 NOTE — Chronic Care Management (AMB) (Unsigned)
   05/04/2022  [name] Jul 27, 1929 [id number]  Documentation encounter created to complete case transition. The care management team will continue to follow [name] for care coordination.  [place] Management [phone]

## 2022-05-05 DIAGNOSIS — E1165 Type 2 diabetes mellitus with hyperglycemia: Secondary | ICD-10-CM | POA: Diagnosis not present

## 2022-05-05 DIAGNOSIS — Z794 Long term (current) use of insulin: Secondary | ICD-10-CM | POA: Diagnosis not present

## 2022-05-10 DIAGNOSIS — Z794 Long term (current) use of insulin: Secondary | ICD-10-CM | POA: Diagnosis not present

## 2022-05-10 DIAGNOSIS — E1165 Type 2 diabetes mellitus with hyperglycemia: Secondary | ICD-10-CM | POA: Diagnosis not present

## 2022-05-17 ENCOUNTER — Other Ambulatory Visit: Payer: Self-pay | Admitting: *Deleted

## 2022-05-17 DIAGNOSIS — G301 Alzheimer's disease with late onset: Secondary | ICD-10-CM

## 2022-05-17 DIAGNOSIS — Z794 Long term (current) use of insulin: Secondary | ICD-10-CM

## 2022-05-18 DIAGNOSIS — Z794 Long term (current) use of insulin: Secondary | ICD-10-CM | POA: Diagnosis not present

## 2022-05-18 DIAGNOSIS — E1159 Type 2 diabetes mellitus with other circulatory complications: Secondary | ICD-10-CM | POA: Diagnosis not present

## 2022-05-18 DIAGNOSIS — E1129 Type 2 diabetes mellitus with other diabetic kidney complication: Secondary | ICD-10-CM | POA: Diagnosis not present

## 2022-05-18 DIAGNOSIS — N1832 Chronic kidney disease, stage 3b: Secondary | ICD-10-CM | POA: Diagnosis not present

## 2022-05-18 DIAGNOSIS — R809 Proteinuria, unspecified: Secondary | ICD-10-CM | POA: Diagnosis not present

## 2022-05-18 DIAGNOSIS — I10 Essential (primary) hypertension: Secondary | ICD-10-CM | POA: Diagnosis not present

## 2022-05-18 DIAGNOSIS — E1122 Type 2 diabetes mellitus with diabetic chronic kidney disease: Secondary | ICD-10-CM | POA: Diagnosis not present

## 2022-05-20 ENCOUNTER — Ambulatory Visit (INDEPENDENT_AMBULATORY_CARE_PROVIDER_SITE_OTHER): Payer: Medicare Other

## 2022-05-20 VITALS — Wt 133.0 lb

## 2022-05-20 DIAGNOSIS — Z0000 Encounter for general adult medical examination without abnormal findings: Secondary | ICD-10-CM

## 2022-05-20 NOTE — Patient Instructions (Signed)
[name] , Thank you for taking time to come for your Medicare Wellness Visit. I appreciate your ongoing commitment to your health goals. Please review the following plan we discussed and let me know if I can assist you in the future.   Screening recommendations/referrals: Colonoscopy: aged out Mammogram: aged out Bone Density: aged out Recommended yearly ophthalmology/optometry visit for glaucoma screening and checkup Recommended yearly dental visit for hygiene and checkup  Vaccinations: Influenza vaccine: 06/25/21 Pneumococcal vaccine: 11/28/14 Tdap vaccine: 02/05/21 Shingles vaccine: n/d   Covid-19:12/25/19, 01/25/20  Advanced directives: no  Conditions/risks identified: none  Next appointment: Follow up in one year for your annual wellness visit 05/23/23 @ 3:15 pm by phone   Preventive Care 35 Years and Older, Female Preventive care refers to lifestyle choices and visits with your health care provider that can promote health and wellness. What does preventive care include? A yearly physical exam. This is also called an annual well check. Dental exams once or twice a year. Routine eye exams. Ask your health care provider how often you should have your eyes checked. Personal lifestyle choices, including: Daily care of your teeth and gums. Regular physical activity. Eating a healthy diet. Avoiding tobacco and drug use. Limiting alcohol use. Practicing safe sex. Taking low-dose aspirin every day. Taking vitamin and mineral supplements as recommended by your health care provider. What happens during an annual well check? The services and screenings done by your health care provider during your annual well check will depend on your age, overall health, lifestyle risk factors, and family history of disease. Counseling  Your health care provider may ask you questions about your: Alcohol use. Tobacco use. Drug use. Emotional well-being. Home and relationship  well-being. Sexual activity. Eating habits. History of falls. Memory and ability to understand (cognition). Work and work Statistician. Reproductive health. Screening  You may have the following tests or measurements: Height, weight, and BMI. Blood pressure. Lipid and cholesterol levels. These may be checked every 5 years, or more frequently if you are over 38 years old. Skin check. Lung cancer screening. You may have this screening every year starting at age 90 if you have a 30-pack-year history of smoking and currently smoke or have quit within the past 15 years. Fecal occult blood test (FOBT) of the stool. You may have this test every year starting at age 60. Flexible sigmoidoscopy or colonoscopy. You may have a sigmoidoscopy every 5 years or a colonoscopy every 10 years starting at age 45. Hepatitis C blood test. Hepatitis B blood test. Sexually transmitted disease (STD) testing. Diabetes screening. This is done by checking your blood sugar (glucose) after you have not eaten for a while (fasting). You may have this done every 1-3 years. Bone density scan. This is done to screen for osteoporosis. You may have this done starting at age 79. Mammogram. This may be done every 1-2 years. Talk to your health care provider about how often you should have regular mammograms. Talk with your health care provider about your test results, treatment options, and if necessary, the need for more tests. Vaccines  Your health care provider may recommend certain vaccines, such as: Influenza vaccine. This is recommended every year. Tetanus, diphtheria, and acellular pertussis (Tdap, Td) vaccine. You may need a Td booster every 10 years. Zoster vaccine. You may need this after age 13. Pneumococcal 13-valent conjugate (PCV13) vaccine. One dose is recommended after age 54. Pneumococcal polysaccharide (PPSV23) vaccine. One dose is recommended after age 3. Talk to your  health care provider about which  screenings and vaccines you need and how often you need them. This information is not intended to replace advice given to you by your health care provider. Make sure you discuss any questions you have with your health care provider. Document Released: 10/10/2015 Document Revised: 06/02/2016 Document Reviewed: 07/15/2015 Elsevier Interactive Patient Education  2017 [employer] Prevention in the Home Falls can cause injuries. They can happen to people of all ages. There are many things you can do to make your home safe and to help prevent falls. What can I do on the outside of my home? Regularly fix the edges of walkways and driveways and fix any cracks. Remove anything that might make you trip as you walk through a door, such as a raised step or threshold. Trim any bushes or trees on the path to your home. Use bright outdoor lighting. Clear any walking paths of anything that might make someone trip, such as rocks or tools. Regularly check to see if handrails are loose or broken. Make sure that both sides of any steps have handrails. Any raised decks and porches should have guardrails on the edges. Have any leaves, snow, or ice cleared regularly. Use sand or salt on walking paths during winter. Clean up any spills in your garage right away. This includes oil or grease spills. What can I do in the bathroom? Use night lights. Install grab bars by the toilet and in the tub and shower. Do not use towel bars as grab bars. Use non-skid mats or decals in the tub or shower. If you need to sit down in the shower, use a plastic, non-slip stool. Keep the floor dry. Clean up any water that spills on the floor as soon as it happens. Remove soap buildup in the tub or shower regularly. Attach bath mats securely with double-sided non-slip rug tape. Do not have throw rugs and other things on the floor that can make you trip. What can I do in the bedroom? Use night lights. Make sure that you have a  light by your bed that is easy to reach. Do not use any sheets or blankets that are too big for your bed. They should not hang down onto the floor. Have a firm chair that has side arms. You can use this for support while you get dressed. Do not have throw rugs and other things on the floor that can make you trip. What can I do in the kitchen? Clean up any spills right away. Avoid walking on wet floors. Keep items that you use a lot in easy-to-reach places. If you need to reach something above you, use a strong step stool that has a grab bar. Keep electrical cords out of the way. Do not use floor polish or wax that makes floors slippery. If you must use wax, use non-skid floor wax. Do not have throw rugs and other things on the floor that can make you trip. What can I do with my stairs? Do not leave any items on the stairs. Make sure that there are handrails on both sides of the stairs and use them. Fix handrails that are broken or loose. Make sure that handrails are as long as the stairways. Check any carpeting to make sure that it is firmly attached to the stairs. Fix any carpet that is loose or worn. Avoid having throw rugs at the top or bottom of the stairs. If you do have throw rugs, attach them  to the floor with carpet tape. Make sure that you have a light switch at the top of the stairs and the bottom of the stairs. If you do not have them, ask someone to add them for you. What else can I do to help prevent falls? Wear shoes that: Do not have high heels. Have rubber bottoms. Are comfortable and fit you well. Are closed at the toe. Do not wear sandals. If you use a stepladder: Make sure that it is fully opened. Do not climb a closed stepladder. Make sure that both sides of the stepladder are locked into place. Ask someone to hold it for you, if possible. Clearly mark and make sure that you can see: Any grab bars or handrails. First and last steps. Where the edge of each step  is. Use tools that help you move around (mobility aids) if they are needed. These include: Canes. Walkers. Scooters. Crutches. Turn on the lights when you go into a dark area. Replace any light bulbs as soon as they burn out. Set up your furniture so you have a clear path. Avoid moving your furniture around. If any of your floors are uneven, fix them. If there are any pets around you, be aware of where they are. Review your medicines with your doctor. Some medicines can make you feel dizzy. This can increase your chance of falling. Ask your doctor what other things that you can do to help prevent falls. This information is not intended to replace advice given to you by your health care provider. Make sure you discuss any questions you have with your health care provider. Document Released: 07/10/2009 Document Revised: 02/19/2016 Document Reviewed: 10/18/2014 Elsevier Interactive Patient Education  2017 [employer].

## 2022-05-20 NOTE — Progress Notes (Signed)
Virtual Visit via Telephone Note  I connected with  [name] on 05/20/22 at 10:00 AM EDT by telephone and verified that I am speaking with the correct person using two identifiers. Spoke w/ son as well.  Location: Patient: home Provider: [facility] Persons participating in the virtual visit: [place]   I discussed the limitations, risks, security and privacy concerns of performing an evaluation and management service by telephone and the availability of in person appointments. The patient expressed understanding and agreed to proceed.  Interactive audio and video telecommunications were attempted between this nurse and patient, however failed, due to patient having technical difficulties OR patient did not have access to video capability.  We continued and completed visit with audio only.  Some vital signs may be absent or patient reported.   Tammy David, [name]  Subjective:   [name] is a 88 y.o. female who presents for Medicare Annual (Subsequent) preventive examination.  Review of Systems           Objective:    There were no vitals filed for this visit. There is no height or weight on file to calculate BMI.     01/26/2022   10:03 PM 04/03/2021    5:21 PM 02/10/2021    6:20 AM 02/09/2021   10:23 PM 06/19/2020    1:44 PM 03/30/2020   11:43 PM 01/18/2020    9:06 PM  Advanced Directives  Does Patient Have a Medical Advance Directive? No No  No Yes Yes No  Type of Advance Directive     Healthcare Power of [place] in Chart?     No - copy requested    Would patient like information on creating a medical advance directive?   No - Patient declined        Current Medications (verified) Outpatient Encounter Medications as of 05/20/2022  Medication Sig   ADVAIR HFA 230-21 MCG/ACT inhaler TAKE 2 PUFFS INTO LUNGS TWICE A DAY   albuterol (VENTOLIN HFA) 108 (90 Base) MCG/ACT  inhaler INHALE 2 PUFFS INTO THE LUNGS EVERY 4 HOURS AS NEEDED FOR WHEEZING ORSHORTNESS OF BREATH   aspirin 325 MG tablet Take 325 mg by mouth daily. Reported on 01/05/2016   atorvastatin (LIPITOR) 10 MG tablet TAKE 1 TABLET BY MOUTH ONCE DAILY.   cetirizine (ZYRTEC) 10 MG tablet TAKE ONE TABLET BY MOUTH EVERY DAY   cholecalciferol (VITAMIN D3) 25 MCG (1000 UT) tablet Take 1,000 Units by mouth daily.   clopidogrel (PLAVIX) 75 MG tablet TAKE 1 TABLET BY MOUTH DAILY   diltiazem (TIADYLT ER) 360 MG 24 hr capsule Hold until followup with your outpatient doctor due to your blood pressure on the low side.   Dupilumab (DUPIXENT) 300 MG/2ML SOPN Inject 300 mg into the skin every 14 (fourteen) days. Starting at day 15 for maintenance.   ezetimibe (ZETIA) 10 MG tablet TAKE 1 TABLET BY MOUTH DAILY   FLUoxetine (PROZAC) 10 MG capsule TAKE 1 CAPSULE BY MOUTH 3 TIMES DAILY.   glucose blood test strip ACCU-CHEK AVIVA PLUS TEST STRP Use to check sugars 3 times daily.   HUMALOG KWIKPEN 100 UNIT/ML KwikPen Inject into the skin.   insulin glargine (LANTUS) 100 UNIT/ML injection Inject 0.05 mLs (5 Units total) into the skin at bedtime.   insulin lispro (HUMALOG) 100 UNIT/ML KiwkPen Inject 10 Units into the skin 3 (three) times daily. 10 units before breakfast, 8 before lunch and  18 before supper   isosorbide mononitrate (IMDUR) 30 MG 24 hr tablet Take 30 mg by mouth daily.   lansoprazole (PREVACID) 30 MG capsule TAKE 1 CAPSULE BY MOUTH ONCE DAILY AT NOON   losartan (COZAAR) 100 MG tablet TAKE 1 TABLET BY MOUTH DAILY   magnesium oxide (MAG-OX) 400 MG tablet Take 400 mg by mouth daily.   meloxicam (MOBIC) 7.5 MG tablet Take 1 tablet (7.5 mg total) by mouth daily as needed for pain.   memantine (NAMENDA) 10 MG tablet Take 10 mg by mouth 2 (two) times daily.   montelukast (SINGULAIR) 10 MG tablet TAKE ONE TABLET AT BEDTIME   ondansetron (ZOFRAN-ODT) 4 MG disintegrating tablet Take 4 mg by mouth every 8 (eight) hours as  needed for nausea. For up to 12 doses.   pantoprazole (PROTONIX) 40 MG tablet Take 40 mg by mouth daily.   QUEtiapine (SEROQUEL) 50 MG tablet Take 50 mg by mouth at bedtime.   tacrolimus (PROTOPIC) 0.1 % ointment Apply topically 2 (two) times daily. Apply to aa's of rash at body   traZODone (DESYREL) 50 MG tablet TAKE 1 AND 1/2 TABLET AT BEDTIME AS NEEDED FOR SLEEP   vitamin B-12 1000 MCG tablet Take 1 tablet (1,000 mcg total) by mouth daily.   vitamin E 1000 UNIT capsule Take 1,000 Units by mouth daily.   iron polysaccharides (NIFEREX) 150 MG capsule Take 1 capsule (150 mg total) by mouth daily.   TUSSIN DM 100-10 MG/5ML liquid TAKE 1 TEASPOONFUL BY MOUTH EVERY 4 HOURS AS NEEDED FOR COUGH (Patient not taking: Reported on 05/20/2022)   No facility-administered encounter medications on file as of 05/20/2022.    Allergies (verified) Bacitracin-neomycin-polymyxin, Neomycin-bacitracin zn-polymyx, Latex, Lidocaine, Aricept [donepezil hcl], Benzalkonium chloride, Ibuprofen, Lidocaine hcl, Valdecoxib, Albuterol, Tape, and Triamcinolone   History: Past Medical History:  Diagnosis Date   Angina pectoris (HCC)    Atrial fibrillation (HCC)    Basal cell carcinoma    nose   Cervicalgia    Chronic back pain    Chronic obstructive pulmonary disease (COPD) (HCC)    COPD (chronic obstructive pulmonary disease) (HCC)    DDD (degenerative disc disease), lumbar    Diabetes mellitus without complication ([place])    Dysphagia    Hyperlipemia    Hypertension    Squamous cell carcinoma of skin    left lateral pretibial   Vulvovaginitis    Past Surgical History:  Procedure Laterality Date   gall bladder     melanoma     removal neck and back   PERIPHERAL VASCULAR CATHETERIZATION Left 01/05/2016   Procedure: Lower Extremity Angiography;  Surgeon: Algernon Huxley, MD;  Location: [place] [facility];  Service: Cardiovascular;  Laterality: Left;   PERIPHERAL VASCULAR CATHETERIZATION  01/05/2016   Procedure:  Lower Extremity Intervention;  Surgeon: Algernon Huxley, MD;  Location: [place] [facility];  Service: Cardiovascular;;   ureterolithiasis     calculus removed   VAGINAL HYSTERECTOMY     VISCERAL ANGIOGRAPHY N/A 05/10/2019   Procedure: VISCERAL ANGIOGRAPHY;  Surgeon: Algernon Huxley, MD;  Location: [place] [facility];  Service: Cardiovascular;  Laterality: N/A;   VULVA / PERINEUM BIOPSY  05/29/2015   Family History  Problem Relation Age of Onset   Ovarian cancer Other    Diabetes Sister    Colon cancer Brother    Diabetes Brother    Atrial fibrillation Daughter    Breast cancer Neg Hx    Social History   Socioeconomic  History   Marital status: Widowed    Spouse name: Not on file   Number of children: 3   Years of education: Not on file   Highest education level: 8th grade  Occupational History   Occupation: retired  Tobacco Use   Smoking status: Never   Smokeless tobacco: Never  Vaping Use   Vaping Use: Never used  Substance and Sexual Activity   Alcohol use: No   Drug use: No   Sexual activity: Never  Other Topics Concern   Not on file  Social History Narrative   Not on file   Social Determinants of Health   Financial Resource Strain: Low Risk  (06/19/2020)   Overall Financial Resource Strain (CARDIA)    Difficulty of Paying Living Expenses: Not hard at all  Food Insecurity: No Food Insecurity (06/19/2020)   Hunger Vital Sign    Worried About Running Out of Food in the Last Year: Never true    [place] in the Last Year: Never true  Transportation Needs: No Transportation Needs (06/19/2020)   PRAPARE - Hydrologist (Medical): No    Lack of Transportation (Non-Medical): No  Physical Activity: Insufficiently Active (05/20/2022)   Exercise Vital Sign    Days of Exercise per Week: 2 days    Minutes of Exercise per Session: 20 min  Stress: No Stress Concern Present (05/20/2022)   [place]    Feeling of Stress : Not at all  Social Connections: Moderately Isolated (06/19/2020)   Social Connection and Isolation Panel [NHANES]    Frequency of Communication with Friends and Family: More than three times a week    Frequency of Social Gatherings with Friends and Family: More than three times a week    Attends Religious Services: More than 4 times per year    Active Member of [employer] or Organizations: No    Attends Archivist Meetings: Never    Marital Status: Widowed    Tobacco Counseling Counseling given: Not Answered   Clinical Intake:  Pre-visit preparation completed: Yes  Pain : No/denies pain     Nutritional Risks: None Diabetes: Yes CBG done?: No Did pt. bring in CBG monitor from home?: No  How often do you need to have someone help you when you read instructions, pamphlets, or other written materials from your doctor or pharmacy?: 1 - Never  Diabetic?yes Nutrition Risk Assessment:  Has the patient had any N/V/D within the last 2 months?  No  Does the patient have any non-healing wounds?  No  Has the patient had any unintentional weight loss or weight gain?  No   Diabetes:  Is the patient diabetic?  Yes  If diabetic, was a CBG obtained today?  No  Did the patient bring in their glucometer from home?  No  How often do you monitor your CBG's? continuous.   Financial Strains and Diabetes Management:  Are you having any financial strains with the device, your supplies or your medication? No .  Does the patient want to be seen by Chronic Care Management for management of their diabetes?  No  Would the patient like to be referred to a Nutritionist or for Diabetic Management?  No   Diabetic Exams:  Diabetic Eye Exam: Completed 07/31/18. Overdue for diabetic eye exam. Pt has been advised about the importance in completing this exam.  Diabetic Foot Exam: Completed 05/28/20. Pt has been advised  about the importance in completing this  exam.   Interpreter Needed?: No  Information entered by :: Tammy Shaggy, [name]   Activities of Daily Living     No data to display          Patient Care Team: Jerrol Banana., MD as PCP - General (Family Medicine) Gabriel Carina, Betsey Holiday, MD as Physician Assistant (Endocrinology) Estill Cotta, MD as Consulting Physician (Ophthalmology) Lucky Cowboy Erskine Squibb, MD as Referring Physician (Vascular Surgery) Corey Skains, MD as Consulting Physician (Cardiology) Ralene Bathe, MD as Consulting Physician (Dermatology) Gardiner Barefoot, DPM as Consulting Physician (Podiatry)  Indicate any recent Medical Services you may have received from other than [facility] providers in the past year (date may be approximate).     Assessment:   This is a routine wellness examination for [place].  Hearing/Vision screen No results found.  Dietary issues and exercise activities discussed:     Goals Addressed             This Visit's Progress    DIET - EAT MORE FRUITS AND VEGETABLES         Depression Screen    05/20/2022   10:00 AM 01/26/2021   10:07 AM 06/19/2020    1:41 PM 01/01/2020    1:37 PM 09/06/2019    4:01 PM 07/26/2019   10:44 AM 03/01/2019   11:09 AM  PHQ 2/9 Scores  PHQ - 2 Score 0 0 0 0 0 1 1  PHQ- 9 Score 0 0         Fall Risk    11/05/2021    4:30 PM 01/26/2021   10:07 AM 06/19/2020    1:44 PM 01/24/2020    1:42 PM 01/01/2020    1:48 PM  Fall Risk   Falls in the past year? '1 1 1 1 1  '$ Number falls in past yr: '1 1 1 1 1  '$ Comment  2     Injury with Fall? '1 1 1 1   '$ Risk for fall due to : Impaired vision;History of fall(s);Impaired balance/gait History of fall(s);Impaired mobility;Impaired vision;Impaired balance/gait;Orthopedic patient Impaired balance/gait;Impaired vision;Medication side effect History of fall(s);Impaired balance/gait;Medication side effect;Other (Comment) Impaired balance/gait;Medication side effect;Impaired vision;History of fall(s)  Risk for fall due to:  Comment    Vision declining (per daughter) Caregiver reports vision is declining.  Follow up Falls prevention discussed Falls evaluation completed Falls prevention discussed Falls prevention discussed Falls prevention discussed    FALL RISK PREVENTION PERTAINING TO THE HOME:  Any stairs in or around the home? No  If so, are there any without handrails? No  Home free of loose throw rugs in walkways, pet beds, electrical cords, etc? Yes  Adequate lighting in your home to reduce risk of falls? Yes   ASSISTIVE DEVICES UTILIZED TO PREVENT FALLS:  Life alert? No  Use of a cane, walker or w/c? Yes  Grab bars in the bathroom? Yes  Shower chair or bench in shower? No  Elevated toilet seat or a handicapped toilet? No   Cognitive Function:son declined PT A & O    06/07/2017    3:00 PM  MMSE - Mini Mental State Exam  Orientation to time 5  Orientation to Place 5  Registration 3  Attention/ Calculation 5  Recall 2  Language- name 2 objects 2  Language- repeat 1  Language- follow 3 step command 2  Language- read & follow direction 0  Write a sentence 1  Copy design 1  Total  score 27        03/01/2019   11:15 AM 10/07/2016    3:33 PM  6CIT Screen  What Year? 0 points   What month? 0 points 0 points  What time? 0 points 0 points  Count back from 20 0 points 0 points  Months in reverse 4 points 0 points  Repeat phrase 0 points 4 points  Total Score 4 points     Immunizations Immunization History  Administered Date(s) Administered   Fluad Quad(high Dose 65+) 06/20/2019, 06/25/2021   Influenza Split 06/27/2010, 07/07/2011, 07/11/2012   Influenza, High Dose Seasonal PF 07/13/2014, 06/24/2015, 06/16/2016, 07/16/2017, 06/27/2018   Influenza,inj,Quad PF,6+ Mos 06/06/2013   Influenza,inj,quad, With Preservative 07/29/2020   Influenza-Unspecified 06/06/2013, 07/29/2020   PFIZER Comirnaty(Gray Top)Covid-19 Tri-Sucrose Vaccine 11/01/2019, 12/25/2019, 01/25/2020   PFIZER(Purple  Top)SARS-COV-2 Vaccination 11/01/2019, 01/18/2020   Pneumococcal Conjugate-13 11/28/2014   Pneumococcal Polysaccharide-23 08/01/1995, 11/17/2012   Td 05/20/2005, 06/27/2018   Tdap 02/08/2021    TDAP status: Up to date  Flu Vaccine status: Up to date  Pneumococcal vaccine status: Up to date  Covid-19 vaccine status: Completed vaccines  Qualifies for Shingles Vaccine? Yes   Zostavax completed No   Shingrix Completed?: No.    Education has been provided regarding the importance of this vaccine. Patient has been advised to call insurance company to determine out of pocket expense if they have not yet received this vaccine. Advised may also receive vaccine at local pharmacy or Health Dept. Verbalized acceptance and understanding.  Screening Tests Health Maintenance  Topic Date Due   Zoster Vaccines- Shingrix (1 of 2) Never done   OPHTHALMOLOGY EXAM  08/01/2019   COVID-19 Vaccine (6 - Pfizer risk series) 03/21/2020   FOOT EXAM  05/28/2021   HEMOGLOBIN A1C  08/11/2021   INFLUENZA VACCINE  04/27/2022   TETANUS/TDAP  02/09/2031   Pneumonia Vaccine 72+ Years old  Completed   DEXA SCAN  Completed   HPV VACCINES  Aged Out    Health Maintenance  Health Maintenance Due  Topic Date Due   Zoster Vaccines- Shingrix (1 of 2) Never done   OPHTHALMOLOGY EXAM  08/01/2019   COVID-19 Vaccine (6 - Pfizer risk series) 03/21/2020   FOOT EXAM  05/28/2021   HEMOGLOBIN A1C  08/11/2021   INFLUENZA VACCINE  04/27/2022    Colorectal cancer screening: No longer required.   Mammogram status: No longer required due to aged.   Lung Cancer Screening: (Low Dose CT Chest recommended if Age 16-80 years, 30 pack-year currently smoking OR have quit w/in 15years.) does not qualify.    Additional Screening:  Hepatitis C Screening: does not qualify; Completed NO  Vision Screening: Recommended annual ophthalmology exams for early detection of glaucoma and other disorders of the eye. Is the patient up to  date with their annual eye exam?  Yes  Who is the provider or what is the name of the office in which the patient attends annual eye exams? [facility] If pt is not established with a provider, would they like to be referred to a provider to establish care? No .   Dental Screening: Recommended annual dental exams for proper oral hygiene  Community Resource Referral / Chronic Care Management: CRR required this visit?  No   CCM required this visit?  No      Plan:     I have personally reviewed and noted the following in the patient's chart:   Medical and social history Use of alcohol, tobacco or illicit  drugs  Current medications and supplements including opioid prescriptions. Patient is not currently taking opioid prescriptions. Functional ability and status Nutritional status Physical activity Advanced directives List of other physicians Hospitalizations, surgeries, and ER visits in previous 12 months Vitals Screenings to include cognitive, depression, and falls Referrals and appointments  In addition, I have reviewed and discussed with patient certain preventive protocols, quality metrics, and best practice recommendations. A written personalized care plan for preventive services as well as general preventive health recommendations were provided to patient.     Tammy David, [name]   2/00/3794   Nurse Notes: [name]

## 2022-05-21 ENCOUNTER — Telehealth: Payer: Self-pay

## 2022-05-21 NOTE — Chronic Care Management (AMB) (Signed)
  Chronic Care Management   Note  05/21/2022 Name: [name] MRN: [id number] DOB: Jan 17, 1929  [name] is a 88 y.o. year old female who is a primary care patient of Jerrol Banana., MD. I reached out to [name] by phone today in response to a referral sent by [name]'s PCP.  [name] was given information about Chronic Care Management services today including:  CCM service includes personalized support from designated clinical staff supervised by her physician, including individualized plan of care and coordination with other care providers 24/7 contact phone numbers for assistance for urgent and routine care needs. Service will only be billed when office clinical staff spend 20 minutes or more in a month to coordinate care. Only one practitioner may furnish and bill the service in a calendar month. The patient may stop CCM services at any time (effective at the end of the month) by phone call to the office staff. The patient is responsible for co-pay (up to 20% after annual deductible is met) if co-pay is required by the individual health plan.   Patient did not agree to enrollment in care management services and does not wish to consider at this time.  Follow up plan: Patient declines further follow up and engagement by the care management team. Appropriate care team members and provider have been notified via electronic communication.   [name], [name], [name] [address] Direct Dial: [phone] [name]

## 2022-05-28 ENCOUNTER — Other Ambulatory Visit: Payer: Self-pay | Admitting: Family Medicine

## 2022-06-01 ENCOUNTER — Other Ambulatory Visit: Payer: Self-pay | Admitting: Family Medicine

## 2022-06-01 NOTE — Telephone Encounter (Signed)
Requested Prescriptions  Pending Prescriptions Disp Refills  . losartan (COZAAR) 100 MG tablet [Pharmacy Med Name: LOSARTAN POTASSIUM 100 MG TAB] 90 tablet 1    Sig: TAKE 1 TABLET BY MOUTH DAILY     Cardiovascular:  Angiotensin Receptor Blockers Failed - 05/28/2022 10:41 AM      Failed - Cr in normal range and within 180 days    Creat  Date Value Ref Range Status  06/07/2017 1.19 (H) 0.60 - 0.88 mg/dL Final    Comment:    For patients >86 years of age, the reference limit for Creatinine is approximately 13% higher for people identified as African-American. .    Creatinine, Ser  Date Value Ref Range Status  01/26/2022 1.13 (H) 0.44 - 1.00 mg/dL Final         Passed - K in normal range and within 180 days    Potassium  Date Value Ref Range Status  01/26/2022 4.5 3.5 - 5.1 mmol/L Final  10/13/2014 3.6 3.5 - 5.1 mmol/L Final         Passed - Patient is not pregnant      Passed - Last BP in normal range    BP Readings from Last 1 Encounters:  04/12/22 122/86         Passed - Valid encounter within last 6 months    Recent Outpatient Visits          1 month ago [place] Jerrol Banana., MD   4 months ago Moderate Lewy body dementia with anxiety [facility])   [facility] Jerrol Banana., MD   10 months ago Moderate Lewy body dementia with anxiety [facility])   [facility] Jerrol Banana., MD   11 months ago Late onset Alzheimer's dementia without behavioral disturbance [facility])   [facility] Jerrol Banana., MD   1 year ago Benign essential HTN   [facility] Jerrol Banana., MD      Future Appointments            In 1 month Jerrol Banana., MD [facility], [place]

## 2022-06-15 ENCOUNTER — Inpatient Hospital Stay (HOSPITAL_COMMUNITY)
Admit: 2022-06-15 | Discharge: 2022-06-15 | Disposition: A | Discharge status: Home / Self Care | Payer: Medicare Other | Attending: Internal Medicine | Admitting: Internal Medicine

## 2022-06-15 ENCOUNTER — Encounter: Payer: Self-pay | Admitting: Emergency Medicine

## 2022-06-15 ENCOUNTER — Inpatient Hospital Stay
Admission: EM | Admit: 2022-06-15 | Discharge: 2022-06-18 | DRG: 291 | Disposition: A | Discharge status: Home Health | Payer: Medicare Other | Attending: Internal Medicine | Admitting: Internal Medicine

## 2022-06-15 ENCOUNTER — Emergency Department: Payer: Medicare Other

## 2022-06-15 ENCOUNTER — Inpatient Hospital Stay: Payer: Medicare Other

## 2022-06-15 ENCOUNTER — Other Ambulatory Visit: Payer: Self-pay

## 2022-06-15 DIAGNOSIS — D509 Iron deficiency anemia, unspecified: Secondary | ICD-10-CM | POA: Diagnosis present

## 2022-06-15 DIAGNOSIS — I509 Heart failure, unspecified: Secondary | ICD-10-CM

## 2022-06-15 DIAGNOSIS — J189 Pneumonia, unspecified organism: Secondary | ICD-10-CM | POA: Diagnosis present

## 2022-06-15 DIAGNOSIS — R0902 Hypoxemia: Secondary | ICD-10-CM

## 2022-06-15 DIAGNOSIS — L209 Atopic dermatitis, unspecified: Secondary | ICD-10-CM | POA: Diagnosis not present

## 2022-06-15 DIAGNOSIS — I480 Paroxysmal atrial fibrillation: Secondary | ICD-10-CM | POA: Diagnosis present

## 2022-06-15 DIAGNOSIS — Z20822 Contact with and (suspected) exposure to covid-19: Secondary | ICD-10-CM | POA: Diagnosis present

## 2022-06-15 DIAGNOSIS — Z794 Long term (current) use of insulin: Secondary | ICD-10-CM | POA: Diagnosis not present

## 2022-06-15 DIAGNOSIS — R0602 Shortness of breath: Secondary | ICD-10-CM | POA: Diagnosis not present

## 2022-06-15 DIAGNOSIS — I5021 Acute systolic (congestive) heart failure: Secondary | ICD-10-CM | POA: Diagnosis not present

## 2022-06-15 DIAGNOSIS — Z7902 Long term (current) use of antithrombotics/antiplatelets: Secondary | ICD-10-CM

## 2022-06-15 DIAGNOSIS — G8929 Other chronic pain: Secondary | ICD-10-CM | POA: Diagnosis not present

## 2022-06-15 DIAGNOSIS — Z66 Do not resuscitate: Secondary | ICD-10-CM | POA: Diagnosis not present

## 2022-06-15 DIAGNOSIS — E1151 Type 2 diabetes mellitus with diabetic peripheral angiopathy without gangrene: Secondary | ICD-10-CM | POA: Diagnosis present

## 2022-06-15 DIAGNOSIS — E1122 Type 2 diabetes mellitus with diabetic chronic kidney disease: Secondary | ICD-10-CM | POA: Diagnosis not present

## 2022-06-15 DIAGNOSIS — Z8616 Personal history of COVID-19: Secondary | ICD-10-CM

## 2022-06-15 DIAGNOSIS — J9601 Acute respiratory failure with hypoxia: Secondary | ICD-10-CM | POA: Diagnosis present

## 2022-06-15 DIAGNOSIS — Z85828 Personal history of other malignant neoplasm of skin: Secondary | ICD-10-CM | POA: Diagnosis not present

## 2022-06-15 DIAGNOSIS — A419 Sepsis, unspecified organism: Secondary | ICD-10-CM

## 2022-06-15 DIAGNOSIS — N1831 Chronic kidney disease, stage 3a: Secondary | ICD-10-CM | POA: Diagnosis not present

## 2022-06-15 DIAGNOSIS — J90 Pleural effusion, not elsewhere classified: Secondary | ICD-10-CM | POA: Diagnosis not present

## 2022-06-15 DIAGNOSIS — Z7982 Long term (current) use of aspirin: Secondary | ICD-10-CM

## 2022-06-15 DIAGNOSIS — I130 Hypertensive heart and chronic kidney disease with heart failure and stage 1 through stage 4 chronic kidney disease, or unspecified chronic kidney disease: Principal | ICD-10-CM | POA: Diagnosis present

## 2022-06-15 DIAGNOSIS — I5031 Acute diastolic (congestive) heart failure: Secondary | ICD-10-CM

## 2022-06-15 DIAGNOSIS — Z833 Family history of diabetes mellitus: Secondary | ICD-10-CM | POA: Diagnosis not present

## 2022-06-15 DIAGNOSIS — F0280 Dementia in other diseases classified elsewhere without behavioral disturbance: Secondary | ICD-10-CM | POA: Diagnosis present

## 2022-06-15 DIAGNOSIS — Z91040 Latex allergy status: Secondary | ICD-10-CM

## 2022-06-15 DIAGNOSIS — J441 Chronic obstructive pulmonary disease with (acute) exacerbation: Secondary | ICD-10-CM | POA: Diagnosis not present

## 2022-06-15 DIAGNOSIS — I2510 Atherosclerotic heart disease of native coronary artery without angina pectoris: Secondary | ICD-10-CM | POA: Diagnosis not present

## 2022-06-15 DIAGNOSIS — G3183 Dementia with Lewy bodies: Secondary | ICD-10-CM | POA: Diagnosis present

## 2022-06-15 DIAGNOSIS — E785 Hyperlipidemia, unspecified: Secondary | ICD-10-CM | POA: Diagnosis not present

## 2022-06-15 DIAGNOSIS — I700 Atherosclerosis of aorta: Secondary | ICD-10-CM | POA: Diagnosis not present

## 2022-06-15 DIAGNOSIS — E1165 Type 2 diabetes mellitus with hyperglycemia: Secondary | ICD-10-CM | POA: Diagnosis present

## 2022-06-15 DIAGNOSIS — I5033 Acute on chronic diastolic (congestive) heart failure: Secondary | ICD-10-CM | POA: Diagnosis not present

## 2022-06-15 DIAGNOSIS — Z86718 Personal history of other venous thrombosis and embolism: Secondary | ICD-10-CM

## 2022-06-15 DIAGNOSIS — Z79899 Other long term (current) drug therapy: Secondary | ICD-10-CM

## 2022-06-15 DIAGNOSIS — J440 Chronic obstructive pulmonary disease with acute lower respiratory infection: Secondary | ICD-10-CM | POA: Diagnosis present

## 2022-06-15 DIAGNOSIS — Z888 Allergy status to other drugs, medicaments and biological substances status: Secondary | ICD-10-CM

## 2022-06-15 DIAGNOSIS — Z91048 Other nonmedicinal substance allergy status: Secondary | ICD-10-CM

## 2022-06-15 LAB — CBG MONITORING, ED: Glucose-Capillary: 344 mg/dL — ABNORMAL HIGH (ref 70–99)

## 2022-06-15 LAB — COMPREHENSIVE METABOLIC PANEL
ALT: 12 U/L (ref 0–44)
AST: 20 U/L (ref 15–41)
Albumin: 3.9 g/dL (ref 3.5–5.0)
Alkaline Phosphatase: 95 U/L (ref 38–126)
Anion gap: 14 (ref 5–15)
BUN: 21 mg/dL (ref 8–23)
CO2: 23 mmol/L (ref 22–32)
Calcium: 8.5 mg/dL — ABNORMAL LOW (ref 8.9–10.3)
Chloride: 103 mmol/L (ref 98–111)
Creatinine, Ser: 1.07 mg/dL — ABNORMAL HIGH (ref 0.44–1.00)
GFR, Estimated: 49 mL/min — ABNORMAL LOW (ref >60)
Glucose, Bld: 272 mg/dL — ABNORMAL HIGH (ref 70–99)
Potassium: 3.8 mmol/L (ref 3.5–5.1)
Sodium: 140 mmol/L (ref 135–145)
Total Bilirubin: 0.9 mg/dL (ref 0.3–1.2)
Total Protein: 7.2 g/dL (ref 6.5–8.1)

## 2022-06-15 LAB — LACTIC ACID, PLASMA
Lactic Acid, Venous: 2.1 mmol/L (ref 0.5–1.9)
Lactic Acid, Venous: 1.7 mmol/L (ref 0.5–1.9)

## 2022-06-15 LAB — URINALYSIS, COMPLETE (UACMP) WITH MICROSCOPIC
Bacteria, UA: NONE SEEN
Bilirubin Urine: NEGATIVE
Glucose, UA: >=500 mg/dL — AB
Hgb urine dipstick: NEGATIVE
Ketones, ur: 5 mg/dL — AB
Nitrite: NEGATIVE
Protein, ur: 100 mg/dL — AB
Specific Gravity, Urine: 1.009 (ref 1.005–1.030)
pH: 5 (ref 5.0–8.0)

## 2022-06-15 LAB — CBC WITH DIFFERENTIAL/PLATELET
Abs Immature Granulocytes: 0.16 10*3/uL — ABNORMAL HIGH (ref 0.00–0.07)
Basophils Absolute: 0.1 10*3/uL (ref 0.0–0.1)
Basophils Relative: 0 %
Eosinophils Absolute: 0.2 10*3/uL (ref 0.0–0.5)
Eosinophils Relative: 1 %
HCT: 33.3 % — ABNORMAL LOW (ref 36.0–46.0)
Hemoglobin: 9.9 g/dL — ABNORMAL LOW (ref 12.0–15.0)
Immature Granulocytes: 1 %
Lymphocytes Relative: 8 %
Lymphs Abs: 1.8 10*3/uL (ref 0.7–4.0)
MCH: 22.4 pg — ABNORMAL LOW (ref 26.0–34.0)
MCHC: 29.7 g/dL — ABNORMAL LOW (ref 30.0–36.0)
MCV: 75.5 fL — ABNORMAL LOW (ref 80.0–100.0)
Monocytes Absolute: 1.8 10*3/uL — ABNORMAL HIGH (ref 0.1–1.0)
Monocytes Relative: 8 %
Neutro Abs: 19.5 10*3/uL — ABNORMAL HIGH (ref 1.7–7.7)
Neutrophils Relative %: 82 %
Platelets: 346 10*3/uL (ref 150–400)
RBC: 4.41 MIL/uL (ref 3.87–5.11)
RDW: 18 % — ABNORMAL HIGH (ref 11.5–15.5)
WBC: 23.5 10*3/uL — ABNORMAL HIGH (ref 4.0–10.5)
nRBC: 0 % (ref 0.0–0.2)

## 2022-06-15 LAB — GLUCOSE, CAPILLARY
Glucose-Capillary: 380 mg/dL — ABNORMAL HIGH (ref 70–99)
Glucose-Capillary: 226 mg/dL — ABNORMAL HIGH (ref 70–99)
Glucose-Capillary: 161 mg/dL — ABNORMAL HIGH (ref 70–99)

## 2022-06-15 LAB — PROCALCITONIN: Procalcitonin: <0.1 ng/mL

## 2022-06-15 LAB — HEMOGLOBIN A1C
Hgb A1c MFr Bld: 6.9 % — ABNORMAL HIGH (ref 4.8–5.6)
Mean Plasma Glucose: 151.33 mg/dL

## 2022-06-15 LAB — TROPONIN I (HIGH SENSITIVITY)
Troponin I (High Sensitivity): 19 ng/L — ABNORMAL HIGH (ref <18)
Troponin I (High Sensitivity): 74 ng/L — ABNORMAL HIGH (ref <18)

## 2022-06-15 LAB — BRAIN NATRIURETIC PEPTIDE: B Natriuretic Peptide: 412.5 pg/mL — ABNORMAL HIGH (ref 0.0–100.0)

## 2022-06-15 LAB — SARS CORONAVIRUS 2 BY RT PCR: SARS Coronavirus 2 by RT PCR: NEGATIVE

## 2022-06-15 MED ORDER — SODIUM CHLORIDE 0.9 % IV BOLUS (SEPSIS)
500.0000 mL | Freq: Once | INTRAVENOUS | Status: AC
  Administered 2022-06-15: 500 mL via INTRAVENOUS

## 2022-06-15 MED ORDER — ATORVASTATIN CALCIUM 10 MG PO TABS
10.0000 mg | ORAL_TABLET | Freq: Every day | ORAL | Status: DC
  Administered 2022-06-15 – 2022-06-18 (×4): 10 mg via ORAL
  Filled 2022-06-15 (×4): qty 1

## 2022-06-15 MED ORDER — SODIUM CHLORIDE 0.9 % IV BOLUS (SEPSIS): 1000.0000 mL | Freq: Once | INTRAVENOUS | Status: DC

## 2022-06-15 MED ORDER — METHYLPREDNISOLONE SODIUM SUCC 125 MG IJ SOLR
125.0000 mg | Freq: Once | INTRAMUSCULAR | Status: AC
Start: 2022-06-15 — End: 2022-06-15
  Administered 2022-06-15: 125 mg via INTRAVENOUS
  Filled 2022-06-15: qty 2

## 2022-06-15 MED ORDER — MELATONIN 5 MG PO TABS
5.0000 mg | ORAL_TABLET | Freq: Every day | ORAL | Status: AC
  Administered 2022-06-15: 5 mg via ORAL
  Filled 2022-06-15: qty 1

## 2022-06-15 MED ORDER — LOSARTAN POTASSIUM 50 MG PO TABS
100.0000 mg | ORAL_TABLET | Freq: Every day | ORAL | Status: DC
  Administered 2022-06-15 – 2022-06-18 (×4): 100 mg via ORAL
  Filled 2022-06-15 (×4): qty 2

## 2022-06-15 MED ORDER — EZETIMIBE 10 MG PO TABS
10.0000 mg | ORAL_TABLET | Freq: Every day | ORAL | Status: DC
  Administered 2022-06-15 – 2022-06-18 (×4): 10 mg via ORAL
  Filled 2022-06-15 (×4): qty 1

## 2022-06-15 MED ORDER — ONDANSETRON HCL 4 MG/2ML IJ SOLN: 4.0000 mg | Freq: Four times a day (QID) | INTRAMUSCULAR | Status: DC | PRN

## 2022-06-15 MED ORDER — ALBUTEROL SULFATE (2.5 MG/3ML) 0.083% IN NEBU
2.5000 mg | INHALATION_SOLUTION | Freq: Four times a day (QID) | RESPIRATORY_TRACT | Status: DC | PRN
  Administered 2022-06-15: 2.5 mg via RESPIRATORY_TRACT
  Filled 2022-06-15: qty 3

## 2022-06-15 MED ORDER — SODIUM CHLORIDE 0.9 % IV SOLN: 250.0000 mL | INTRAVENOUS | Status: DC | PRN

## 2022-06-15 MED ORDER — CARVEDILOL 6.25 MG PO TABS
6.2500 mg | ORAL_TABLET | Freq: Two times a day (BID) | ORAL | Status: DC
  Administered 2022-06-15 – 2022-06-18 (×7): 6.25 mg via ORAL
  Filled 2022-06-15 (×7): qty 1

## 2022-06-15 MED ORDER — MONTELUKAST SODIUM 10 MG PO TABS: 10.0000 mg | ORAL_TABLET | Freq: Every day | ORAL | Status: DC

## 2022-06-15 MED ORDER — SODIUM CHLORIDE 0.9% FLUSH
3.0000 mL | Freq: Two times a day (BID) | INTRAVENOUS | Status: DC
  Administered 2022-06-15 – 2022-06-18 (×6): 3 mL via INTRAVENOUS

## 2022-06-15 MED ORDER — ENOXAPARIN SODIUM 30 MG/0.3ML IJ SOSY
30.0000 mg | PREFILLED_SYRINGE | INTRAMUSCULAR | Status: DC
  Administered 2022-06-15 – 2022-06-17 (×3): 30 mg via SUBCUTANEOUS
  Filled 2022-06-15 (×3): qty 0.3

## 2022-06-15 MED ORDER — QUETIAPINE FUMARATE 25 MG PO TABS
50.0000 mg | ORAL_TABLET | Freq: Every day | ORAL | Status: DC
  Administered 2022-06-15 – 2022-06-17 (×3): 50 mg via ORAL
  Filled 2022-06-15 (×3): qty 2

## 2022-06-15 MED ORDER — ASPIRIN 325 MG PO TABS
325.0000 mg | ORAL_TABLET | Freq: Every day | ORAL | Status: DC
  Administered 2022-06-15 – 2022-06-18 (×4): 325 mg via ORAL
  Filled 2022-06-15 (×4): qty 1

## 2022-06-15 MED ORDER — ISOSORBIDE MONONITRATE ER 30 MG PO TB24
30.0000 mg | ORAL_TABLET | Freq: Every day | ORAL | Status: DC
  Administered 2022-06-15 – 2022-06-18 (×4): 30 mg via ORAL
  Filled 2022-06-15 (×4): qty 1

## 2022-06-15 MED ORDER — SODIUM CHLORIDE 0.9 % IV SOLN
500.0000 mg | INTRAVENOUS | Status: DC
  Administered 2022-06-15 – 2022-06-17 (×3): 500 mg via INTRAVENOUS
  Filled 2022-06-15 (×3): qty 5

## 2022-06-15 MED ORDER — FUROSEMIDE 10 MG/ML IJ SOLN: 20.0000 mg | Freq: Two times a day (BID) | INTRAMUSCULAR | Status: DC

## 2022-06-15 MED ORDER — SODIUM CHLORIDE 0.9% FLUSH: 3.0000 mL | INTRAVENOUS | Status: DC | PRN

## 2022-06-15 MED ORDER — TRAZODONE HCL 50 MG PO TABS: 50.0000 mg | ORAL_TABLET | Freq: Every day | ORAL | Status: DC

## 2022-06-15 MED ORDER — IPRATROPIUM-ALBUTEROL 0.5-2.5 (3) MG/3ML IN SOLN
RESPIRATORY_TRACT | Status: AC
  Filled 2022-06-15: qty 3

## 2022-06-15 MED ORDER — IPRATROPIUM-ALBUTEROL 0.5-2.5 (3) MG/3ML IN SOLN
3.0000 mL | Freq: Once | RESPIRATORY_TRACT | Status: AC
  Administered 2022-06-15: 3 mL via RESPIRATORY_TRACT
  Filled 2022-06-15: qty 3

## 2022-06-15 MED ORDER — FUROSEMIDE 10 MG/ML IJ SOLN
20.0000 mg | Freq: Once | INTRAMUSCULAR | Status: AC
  Administered 2022-06-15: 20 mg via INTRAVENOUS
  Filled 2022-06-15: qty 2

## 2022-06-15 MED ORDER — FUROSEMIDE 10 MG/ML IJ SOLN
40.0000 mg | Freq: Two times a day (BID) | INTRAMUSCULAR | Status: DC
  Administered 2022-06-15 – 2022-06-16 (×2): 40 mg via INTRAVENOUS
  Filled 2022-06-15 (×2): qty 4

## 2022-06-15 MED ORDER — POLYSACCHARIDE IRON COMPLEX 150 MG PO CAPS
150.0000 mg | ORAL_CAPSULE | Freq: Every day | ORAL | Status: DC
  Administered 2022-06-15 – 2022-06-18 (×4): 150 mg via ORAL
  Filled 2022-06-15 (×5): qty 1

## 2022-06-15 MED ORDER — INSULIN ASPART 100 UNIT/ML IJ SOLN
10.0000 [IU] | Freq: Three times a day (TID) | INTRAMUSCULAR | Status: DC
  Administered 2022-06-15 – 2022-06-16 (×3): 10 [IU] via SUBCUTANEOUS
  Filled 2022-06-15 (×3): qty 1

## 2022-06-15 MED ORDER — FLUOXETINE HCL 10 MG PO CAPS
10.0000 mg | ORAL_CAPSULE | Freq: Three times a day (TID) | ORAL | Status: DC
  Administered 2022-06-15 – 2022-06-18 (×10): 10 mg via ORAL
  Filled 2022-06-15 (×12): qty 1

## 2022-06-15 MED ORDER — ACETAMINOPHEN 325 MG PO TABS
650.0000 mg | ORAL_TABLET | ORAL | Status: DC | PRN
  Administered 2022-06-15 – 2022-06-16 (×3): 650 mg via ORAL
  Filled 2022-06-15 (×3): qty 2

## 2022-06-15 MED ORDER — INSULIN ASPART 100 UNIT/ML IJ SOLN
0.0000 [IU] | Freq: Three times a day (TID) | INTRAMUSCULAR | Status: DC
  Administered 2022-06-15: 3 [IU] via SUBCUTANEOUS
  Administered 2022-06-15: 9 [IU] via SUBCUTANEOUS
  Filled 2022-06-15 (×9): qty 1

## 2022-06-15 MED ORDER — HYDRALAZINE HCL 20 MG/ML IJ SOLN: 5.0000 mg | INTRAMUSCULAR | Status: DC | PRN

## 2022-06-15 MED ORDER — SODIUM CHLORIDE 0.9 % IV SOLN
2.0000 g | INTRAVENOUS | Status: DC
  Administered 2022-06-15 – 2022-06-17 (×3): 2 g via INTRAVENOUS
  Filled 2022-06-15 (×3): qty 20

## 2022-06-15 MED ORDER — ONDANSETRON HCL 4 MG/2ML IJ SOLN
INTRAMUSCULAR | Status: AC
  Administered 2022-06-15: 4 mg via INTRAVENOUS
  Filled 2022-06-15: qty 2

## 2022-06-15 MED ORDER — ONDANSETRON HCL 4 MG/2ML IJ SOLN: 4.0000 mg | Freq: Once | INTRAMUSCULAR | Status: AC

## 2022-06-15 MED ORDER — MEMANTINE HCL 5 MG PO TABS
10.0000 mg | ORAL_TABLET | Freq: Two times a day (BID) | ORAL | Status: DC
  Administered 2022-06-15 – 2022-06-18 (×7): 10 mg via ORAL
  Filled 2022-06-15 (×7): qty 2

## 2022-06-15 MED ORDER — PANTOPRAZOLE SODIUM 40 MG PO TBEC
40.0000 mg | DELAYED_RELEASE_TABLET | Freq: Every day | ORAL | Status: DC
  Administered 2022-06-15 – 2022-06-18 (×4): 40 mg via ORAL
  Filled 2022-06-15 (×4): qty 1

## 2022-06-15 MED ORDER — MOMETASONE FURO-FORMOTEROL FUM 200-5 MCG/ACT IN AERO: 2.0000 | INHALATION_SPRAY | Freq: Two times a day (BID) | RESPIRATORY_TRACT | Status: DC

## 2022-06-15 MED ORDER — LORATADINE 10 MG PO TABS
10.0000 mg | ORAL_TABLET | Freq: Every day | ORAL | Status: DC
  Administered 2022-06-15 – 2022-06-18 (×4): 10 mg via ORAL
  Filled 2022-06-15 (×5): qty 1

## 2022-06-15 MED ORDER — TACROLIMUS 0.1 % EX OINT
TOPICAL_OINTMENT | Freq: Two times a day (BID) | CUTANEOUS | Status: DC
  Filled 2022-06-15: qty 30

## 2022-06-15 MED ORDER — PANTOPRAZOLE SODIUM 20 MG PO TBEC: 20.0000 mg | DELAYED_RELEASE_TABLET | Freq: Every day | ORAL | Status: DC

## 2022-06-15 MED ORDER — FUROSEMIDE 10 MG/ML IJ SOLN
20.0000 mg | Freq: Once | INTRAMUSCULAR | Status: AC
  Administered 2022-06-15: 20 mg via INTRAVENOUS
  Filled 2022-06-15: qty 4

## 2022-06-15 MED ORDER — CLOPIDOGREL BISULFATE 75 MG PO TABS
75.0000 mg | ORAL_TABLET | Freq: Every day | ORAL | Status: DC
  Administered 2022-06-15 – 2022-06-18 (×4): 75 mg via ORAL
  Filled 2022-06-15 (×4): qty 1

## 2022-06-15 NOTE — ED Notes (Signed)
Pt titrated to RA with RT

## 2022-06-15 NOTE — Evaluation (Addendum)
Clinical/Bedside Swallow Evaluation Patient Details  Name: [name] MRN: [id number] Date of Birth: 12-Jul-1929  Today's Date: 06/15/2022 Time: SLP Start Time (ACUTE ONLY): 64 SLP Stop Time (ACUTE ONLY): 1600 SLP Time Calculation (min) (ACUTE ONLY): 50 min  Past Medical History:  Past Medical History:  Diagnosis Date   Angina pectoris (HCC)    Atrial fibrillation (HCC)    Basal cell carcinoma    nose   Cervicalgia    Chronic back pain    Chronic obstructive pulmonary disease (COPD) (HCC)    COPD (chronic obstructive pulmonary disease) (HCC)    DDD (degenerative disc disease), lumbar    Diabetes mellitus without complication (HCC)    Dysphagia    Hyperlipemia    Hypertension    Squamous cell carcinoma of skin    left lateral pretibial   Vulvovaginitis    Past Surgical History:  Past Surgical History:  Procedure Laterality Date   gall bladder     melanoma     removal neck and back   PERIPHERAL VASCULAR CATHETERIZATION Left 01/05/2016   Procedure: Lower Extremity Angiography;  Surgeon: Algernon Huxley, MD;  Location: [facility];  Service: Cardiovascular;  Laterality: Left;   PERIPHERAL VASCULAR CATHETERIZATION  01/05/2016   Procedure: Lower Extremity Intervention;  Surgeon: Algernon Huxley, MD;  Location: [facility];  Service: Cardiovascular;;   ureterolithiasis     calculus removed   VAGINAL HYSTERECTOMY     VISCERAL ANGIOGRAPHY N/A 05/10/2019   Procedure: VISCERAL ANGIOGRAPHY;  Surgeon: Algernon Huxley, MD;  Location: [facility];  Service: Cardiovascular;  Laterality: N/A;   VULVA / PERINEUM BIOPSY  05/29/2015   HPI:  Pt is a 88 y.o. female with medical history significant of CAD, PAF not on anticoagulation, HTN, HLD, IDDM, PVD, COPD Gold stage I, CKD stage IIIa, chronic normocytic anemia, Dementia, presented with cough shortness of breath.   Patient started to develop dry cough last 1 to 2 days.  No fever chills no chest pain.  Patient's son  noticed patient was coughing yesterday afternoon.  This morning, patient woke up from sleep with severe shortness of breath and called his son, who came in and drove him to the ED.  Recent medical changes.  Denied any urinary symptoms, no diarrhea no rash.  Patient was started on Dupixent every 2 weeks injection for her dermatitis about 2 months ago but denied any rash at this point.   Chest CT: Patchy consolidative and ground-glass opacities in all lobes of  the lung, most consistent with multifocal pneumonia.  2. Small bilateral pleural effusions and slight interlobular septal  thickening, which may represent mild superimposed interstitial  edema.  BPZ:WCHENIDP CHF findings with vascular engorgement and flow  cephalization and moderate interstitial edema.   Additional opacities in the mid to lower lung fields most likely due  to alveolar edema, less likely multifocal pneumonia, with small  pleural effusions.  No other recent CXRs.    Assessment / Plan / Recommendation  Clinical Impression   Pt seen for BSE today. Pt awake, verbal and pleasant; mild tangential speech - suspect impact from Dementia baseline. Son present. Pt w/ Vision deficits. Baseline GERD. Afebrile; on 3L [place]. Pt appears to present w/ grossly adequate oropharyngeal phase swallow w/ No oropharyngeal phase dysphagia noted, No neuromuscular deficits of swallowing noted. Pt consumed po trials w/ No immediate, overt, clinical s/s of aspiration during po trials. Pt appears at reduced risk for aspiration following general aspiration  precautions.  However, pt does have challenging factors that could impact her oropharyngeal swallowing to include recent illness, GERD, and Baseline Dementia; also weakness and advanced age. These factors can increase risk for aspiration, dysphagia as well as decreased oral intake overall.   During po trials, pt consumed all consistencies w/ no overt coughing, decline in vocal quality, or change in respiratory  presentation during/post trials. No decline in O2 sats; 97%. Oral phase appeared grossly [facility] w/ timely bolus management, mastication, and control of bolus propulsion for A-P transfer for swallowing. Oral clearing achieved w/ all trial consistencies -- moistened, soft foods given.  OM Exam appeared [facility] w/ no unilateral weakness noted. Speech Clear. Pt fed self w/ setup support.   Recommend continue a semi-Regular consistency diet but w/ well-Cut meats/foods for ease, moistened foods; Thin liquids -- carefully monitor straw use. Recommend general aspiration precautions, REFLUX precautions d/t baseline GERD. Tray setup to support currently. Pills 1-2 at a time w/ liquid OR Whole in Puree for safer, easier swallowing.  Education given on Pills in Puree; food consistencies and easy to eat options; general aspiration precautions to pt and Son. NSG to reconsult if any new needs arise. NSG updated, agreed. MD updated. Recommend Dietician and Palliative Care services f/u for support. SLP Visit Diagnosis: Dysphagia, unspecified (R13.10)    Aspiration Risk   (reduced following general precautions)    Diet Recommendation   Regular consistency diet but w/ well-Cut meats/foods for ease, moistened foods; Thin liquids -- carefully monitor straw use. Recommend general aspiration precautions, REFLUX precautions. Tray setup and positioning upright for support; vision deficits.   Medication Administration: Whole meds with liquid (1-2 at a time rec'd)    Other  Recommendations Recommended Consults:  (Dietician f/u if needed) Oral Care Recommendations: Oral care BID;Oral care before and after PO;Patient independent with oral care Other Recommendations:  (n/a)    Recommendations for follow up therapy are one component of a multi-disciplinary discharge planning process, led by the attending physician.  Recommendations may be updated based on patient status, additional functional criteria and insurance  authorization.  Follow up Recommendations No SLP follow up      Assistance Recommended at Discharge PRN  Functional Status Assessment Patient has not had a recent decline in their functional status  Frequency and Duration  (n/a)   (n/a)       Prognosis Prognosis for Safe Diet Advancement: Good Barriers to Reach Goals:  (n/a)      Swallow Study   General Date of Onset: 06/15/22 HPI: Pt is a 88 y.o. female with medical history significant of CAD, PAF not on anticoagulation, HTN, HLD, IDDM, PVD, COPD Gold stage I, CKD stage IIIa, chronic normocytic anemia, dementia, presented with cough shortness of breath.     Patient started to develop dry cough last 1 to 2 days.  No fever chills no chest pain.  Patient's son noticed patient was coughing yesterday afternoon.  This morning, patient woke up from sleep with severe shortness of breath and called his son, who came in and drove him to the ED.  Recent medical changes.  Denied any urinary symptoms, no diarrhea no rash.  Patient was started on Dupixent every 2 weeks injection for her dermatitis about 2 months ago but denied any rash at this point.   Chest CT: Patchy consolidative and ground-glass opacities in all lobes of  the lung, most consistent with multifocal pneumonia.  2. Small bilateral pleural effusions and slight interlobular septal  thickening, which may  represent mild superimposed interstitial  edema.  [id number] CHF findings with vascular engorgement and flow  cephalization and moderate interstitial edema.     Additional opacities in the mid to lower lung fields most likely due  to alveolar edema, less likely multifocal pneumonia, with small  pleural effusions.  No other recent CXRs. Type of Study: Bedside Swallow Evaluation Previous Swallow Assessment: none Diet Prior to this Study: NPO (regular diet just prior) Temperature Spikes Noted: No (WBC elevated) Respiratory Status: Nasal cannula (3L) History of Recent Intubation:  No Behavior/Cognition: Alert;Cooperative;Pleasant mood Oral Cavity Assessment: Within Functional Limits Oral Care Completed by SLP: Yes Oral Cavity - Dentition: Adequate natural dentition Vision: Functional for self-feeding (but overall macular dengeneration per pt) Self-Feeding Abilities: Able to feed self;Needs assist;Needs set up Patient Positioning: Upright in bed (stated she wanted to sit up all the way) Baseline Vocal Quality: Normal Volitional Cough: Strong Volitional Swallow: Able to elicit    Oral/Motor/Sensory Function Overall Oral Motor/Sensory Function: Within functional limits   Ice Chips Ice chips: Within functional limits Presentation: Spoon (fed; 2 trials)   Thin Liquid Thin Liquid: Within functional limits Presentation: Cup;Self Fed (10 trials)    Nectar Thick Nectar Thick Liquid: Not tested   Honey Thick Honey Thick Liquid: Not tested   Puree Puree: Within functional limits Presentation: Self Fed;Spoon (7-8 trials)   Solid     Solid: Within functional limits (moistened) Presentation: Self Fed (4-5 tirals)         Orinda Kenner, MS, CCC-SLP Speech Language Pathologist Rehab Services; [place] [phone] (ascom) [employer]son [employer]ix 06/15/2022,4:45 PM

## 2022-06-15 NOTE — Progress Notes (Signed)
Family brought in medication called dupixent.  MD aware of medication and said it was ok for family to administer.    Family administered in left upper thigh.

## 2022-06-15 NOTE — ED Notes (Signed)
Dr. Duncan at bedside 

## 2022-06-15 NOTE — H&P (Signed)
History and Physical    [name]:[id number] DOB: 1928/10/22 DOA: 06/15/2022  PCP: Jerrol Banana., MD (Confirm with patient/family/NH records and if not entered, this has to be entered at [facility] point of entry) Patient coming from: Home  I have personally briefly reviewed patient's old medical records in [place]  Chief Complaint: Cough, SOB  HPI: [name] is a 88 y.o. female with medical history significant of CAD, PAF not on anticoagulation, HTN, HLD, IDDM, PVD, COPD Gold stage I, CKD stage IIIa, chronic normocytic anemia, dementia, presented with cough shortness of breath.  Patient started to develop dry cough last 1 to 2 days.  No fever chills no chest pain.  Patient's son noticed patient was coughing yesterday afternoon.  This morning, patient woke up from sleep with severe shortness of breath and called his son, who came in and drove him to the ED.  Recent medical changes.  Denied any urinary symptoms, no diarrhea no rash.  Patient was started on Dupixent every 2 weeks injection for her dermatitis about 2 months ago but denied any rash at this point.  ED Course: Significant blood pressure elevation, hypoxia 85% on room air and patient was started on BiPAP.  Chest x-ray showed pulmonary congestion, patient was given 20 mg IV Lasix.  Review of Systems: As per HPI otherwise 14 point review of systems negative.    Past Medical History:  Diagnosis Date   Angina pectoris (HCC)    Atrial fibrillation (HCC)    Basal cell carcinoma    nose   Cervicalgia    Chronic back pain    Chronic obstructive pulmonary disease (COPD) (HCC)    COPD (chronic obstructive pulmonary disease) (HCC)    DDD (degenerative disc disease), lumbar    Diabetes mellitus without complication (HCC)    Dysphagia    Hyperlipemia    Hypertension    Squamous cell carcinoma of skin    left lateral pretibial   Vulvovaginitis     Past Surgical History:  Procedure Laterality  Date   gall bladder     melanoma     removal neck and back   PERIPHERAL VASCULAR CATHETERIZATION Left 01/05/2016   Procedure: Lower Extremity Angiography;  Surgeon: Algernon Huxley, MD;  Location: [facility];  Service: Cardiovascular;  Laterality: Left;   PERIPHERAL VASCULAR CATHETERIZATION  01/05/2016   Procedure: Lower Extremity Intervention;  Surgeon: Algernon Huxley, MD;  Location: [facility];  Service: Cardiovascular;;   ureterolithiasis     calculus removed   VAGINAL HYSTERECTOMY     VISCERAL ANGIOGRAPHY N/A 05/10/2019   Procedure: VISCERAL ANGIOGRAPHY;  Surgeon: Algernon Huxley, MD;  Location: [facility];  Service: Cardiovascular;  Laterality: N/A;   VULVA / PERINEUM BIOPSY  05/29/2015     reports that she has never smoked. She has never used smokeless tobacco. She reports that she does not drink alcohol and does not use drugs.  Allergies  Allergen Reactions   Bacitracin-Neomycin-Polymyxin Rash   Neomycin-Bacitracin Zn-Polymyx Swelling and Rash   Latex Rash   Lidocaine Rash   Aricept [Donepezil Hcl] Diarrhea   Benzalkonium Chloride Itching and Swelling   Ibuprofen     Other reaction(s): Dizziness Heart fluttering. Tachycardia. Tachycardia.   Lidocaine Hcl Itching    Per pt, "all caine meds cause severe itching"   Valdecoxib Nausea And Vomiting   Albuterol Rash   Tape Rash    blisters   Triamcinolone Rash  Family History  Problem Relation Age of Onset   Ovarian cancer Other    Diabetes Sister    Colon cancer Brother    Diabetes Brother    Atrial fibrillation Daughter    Breast cancer Neg Hx     Prior to Admission medications   Medication Sig Start Date End Date Taking? Authorizing Provider  aspirin 325 MG tablet Take 325 mg by mouth daily. Reported on 01/05/2016   Yes [provider]  atorvastatin (LIPITOR) 10 MG tablet TAKE 1 TABLET BY MOUTH ONCE DAILY. 04/06/22  Yes Jerrol Banana., MD  clopidogrel (PLAVIX) 75 MG tablet TAKE  1 TABLET BY MOUTH DAILY 04/06/22  Yes Jerrol Banana., MD  ezetimibe (ZETIA) 10 MG tablet TAKE 1 TABLET BY MOUTH DAILY 04/30/22  Yes Jerrol Banana., MD  fexofenadine (ALLEGRA) 180 MG tablet Take 180 mg by mouth daily.   Yes [provider]  FLUoxetine (PROZAC) 10 MG capsule TAKE 1 CAPSULE BY MOUTH 3 TIMES DAILY. 04/30/22  Yes Jerrol Banana., MD  HUMALOG KWIKPEN 100 UNIT/ML KwikPen Inject 10 Units into the skin 3 (three) times daily. 10/06/21  Yes [provider]  isosorbide mononitrate (IMDUR) 30 MG 24 hr tablet Take 30 mg by mouth daily. 06/12/20  Yes [provider]  lansoprazole (PREVACID) 30 MG capsule TAKE 1 CAPSULE BY MOUTH ONCE DAILY AT NOON Patient taking differently: Take 30 mg by mouth in the morning. 06/01/22  Yes Jerrol Banana., MD  losartan (COZAAR) 100 MG tablet TAKE 1 TABLET BY MOUTH DAILY 06/01/22  Yes Jerrol Banana., MD  memantine [facility]) 10 MG tablet Take 10 mg by mouth 2 (two) times daily. 12/31/21  Yes [provider]  QUEtiapine (SEROQUEL) 50 MG tablet Take 50 mg by mouth at bedtime. 11/09/21  Yes [provider]  tacrolimus (PROTOPIC) 0.1 % ointment Apply topically 2 (two) times daily. Apply to aa's of rash at body 02/24/22  Yes Ralene Bathe, MD  vitamin E 1000 UNIT capsule Take 1,000 Units by mouth daily.   Yes [provider]  [name] 268-34 MCG/ACT inhaler TAKE 2 PUFFS INTO LUNGS TWICE A DAY Patient not taking: Reported on 06/15/2022 02/04/21   Flora Lipps, MD  albuterol (VENTOLIN HFA) 108 (90 Base) MCG/ACT inhaler INHALE 2 PUFFS INTO THE LUNGS EVERY 4 HOURS AS NEEDED FOR WHEEZING ORSHORTNESS OF BREATH 04/12/22   Jerrol Banana., MD  cetirizine (ZYRTEC) 10 MG tablet TAKE ONE TABLET BY MOUTH EVERY DAY Patient not taking: Reported on 06/15/2022 12/12/18   Jerrol Banana., MD  cholecalciferol (VITAMIN D3) 25 MCG (1000 UT) tablet Take 1,000 Units by mouth daily. Patient not  taking: Reported on 06/15/2022    [provider]  diltiazem (TIADYLT ER) 360 MG 24 hr capsule Hold until followup with your outpatient doctor due to your blood pressure on the low side. Patient not taking: Reported on 06/15/2022 02/10/21   Enzo Bi, MD  Dupilumab (DUPIXENT) 300 MG/2ML SOPN Inject 300 mg into the skin every 14 (fourteen) days. Starting at day 15 for maintenance. 03/10/22   Ralene Bathe, MD  glucose blood test strip ACCU-CHEK AVIVA PLUS TEST STRP Use to check sugars 3 times daily. 10/28/15   [provider]  insulin glargine (LANTUS) 100 UNIT/ML injection Inject 0.05 mLs (5 Units total) into the skin at bedtime. Patient not taking: Reported on 06/15/2022 07/02/16   Epifanio Lesches, MD  insulin lispro (  HUMALOG) 100 UNIT/ML KiwkPen Inject 10 Units into the skin 3 (three) times daily. 10 units before breakfast, 8 before lunch and 18 before supper Patient not taking: Reported on 06/15/2022    [provider]  iron polysaccharides (NIFEREX) 150 MG capsule Take 1 capsule (150 mg total) by mouth daily. 02/13/21 03/15/21  Sharen Hones, MD  magnesium oxide (MAG-OX) 400 MG tablet Take 400 mg by mouth daily. Patient not taking: Reported on 06/15/2022    [provider]  meloxicam (MOBIC) 7.5 MG tablet Take 1 tablet (7.5 mg total) by mouth daily as needed for pain. Patient not taking: Reported on 06/15/2022 03/18/20   Jerrol Banana., MD  montelukast (SINGULAIR) 10 MG tablet TAKE ONE TABLET AT BEDTIME Patient not taking: Reported on 06/15/2022 12/03/20   Jerrol Banana., MD  ondansetron (ZOFRAN-ODT) 4 MG disintegrating tablet Take 4 mg by mouth every 8 (eight) hours as needed for nausea. For up to 12 doses. Patient not taking: Reported on 06/15/2022 02/06/21   [provider]  pantoprazole (PROTONIX) 40 MG tablet Take 40 mg by mouth daily. Patient not taking: Reported on 06/15/2022    [provider]  traZODone (DESYREL) 50 MG tablet  TAKE 1 AND 1/2 TABLET AT BEDTIME AS NEEDED FOR SLEEP Patient not taking: Reported on 06/15/2022 05/01/21   Jerrol Banana., MD  TUSSIN DM 100-10 MG/5ML liquid TAKE 1 TEASPOONFUL BY MOUTH EVERY 4 HOURS AS NEEDED FOR COUGH Patient not taking: Reported on 05/20/2022 08/20/19   Flora Lipps, MD  vitamin B-12 1000 MCG tablet Take 1 tablet (1,000 mcg total) by mouth daily. Patient not taking: Reported on 06/15/2022 02/13/21   Sharen Hones, MD    Physical Exam: Vitals:   06/15/22 0955 06/15/22 1000 06/15/22 1010 06/15/22 1026  BP:  (!) 177/72  (!) 170/71  Pulse:  82  83  Resp:  17  14  Temp:   98.2 F (36.8 C)   TempSrc:      SpO2: 100% 91%  98%  Weight:      Height:        Constitutional: NAD, calm, comfortable Vitals:   06/15/22 0955 06/15/22 1000 06/15/22 1010 06/15/22 1026  BP:  (!) 177/72  (!) 170/71  Pulse:  82  83  Resp:  17  14  Temp:   98.2 F (36.8 C)   TempSrc:      SpO2: 100% 91%  98%  Weight:      Height:       Eyes: PERRL, lids and conjunctivae normal ENMT: Mucous membranes are moist. Posterior pharynx clear of any exudate or lesions.Normal dentition.  Neck: normal, supple, no masses, no thyromegaly Respiratory: Diminished breathing sounds on bilaterally, scattered wheezing, diffused crackles bilaterally, increasing breathing effort, talking in broken sentences. No accessory muscle use.  Cardiovascular: Regular rate and rhythm, no murmurs / rubs / gallops. No extremity edema. 2+ pedal pulses. No carotid bruits.  Abdomen: no tenderness, no masses palpated. No hepatosplenomegaly. Bowel sounds positive.  Musculoskeletal: no clubbing / cyanosis. No joint deformity upper and lower extremities. Good ROM, no contractures. Normal muscle tone.  Skin: no rashes, lesions, ulcers. No induration Neurologic: CN 2-12 grossly intact. Sensation intact, DTR normal. Strength 5/5 in all 4.  Psychiatric: Normal judgment and insight. Alert and oriented x 3. Normal mood.     Labs on  Admission: I have personally reviewed following labs and imaging studies  CBC: Recent Labs  Lab 06/15/22 0154  WBC 23.5*  NEUTROABS 19.5*  HGB 9.9*  HCT 33.3*  MCV 75.5*  PLT 161   Basic Metabolic Panel: Recent Labs  Lab 06/15/22 0154  NA 140  K 3.8  CL 103  CO2 23  GLUCOSE 272*  BUN 21  CREATININE 1.07*  CALCIUM 8.5*   GFR: Estimated Creatinine Clearance: 29.7 mL/min (A) (by C-G formula based on SCr of 1.07 mg/dL (H)). Liver Function Tests: Recent Labs  Lab 06/15/22 0154  AST 20  ALT 12  ALKPHOS 95  BILITOT 0.9  PROT 7.2  ALBUMIN 3.9   No results for input(s): "LIPASE", "AMYLASE" in the last 168 hours. No results for input(s): "AMMONIA" in the last 168 hours. Coagulation Profile: No results for input(s): "INR", "PROTIME" in the last 168 hours. Cardiac Enzymes: No results for input(s): "CKTOTAL", "CKMB", "CKMBINDEX", "TROPONINI" in the last 168 hours. BNP (last 3 results) No results for input(s): "PROBNP" in the last 8760 hours. HbA1C: No results for input(s): "HGBA1C" in the last 72 hours. CBG: Recent Labs  Lab 06/15/22 0843  GLUCAP 344*   Lipid Profile: No results for input(s): "CHOL", "HDL", "LDLCALC", "TRIG", "CHOLHDL", "LDLDIRECT" in the last 72 hours. Thyroid Function Tests: No results for input(s): "TSH", "T4TOTAL", "FREET4", "T3FREE", "THYROIDAB" in the last 72 hours. Anemia Panel: No results for input(s): "VITAMINB12", "FOLATE", "FERRITIN", "TIBC", "IRON", "RETICCTPCT" in the last 72 hours. Urine analysis:    Component Value Date/Time   COLORURINE STRAW (A) 06/15/2022 0836   APPEARANCEUR CLEAR (A) 06/15/2022 0836   APPEARANCEUR Clear 01/23/2015 1444   LABSPEC 1.009 06/15/2022 0836   LABSPEC 1.013 01/23/2015 1444   PHURINE 5.0 06/15/2022 0836   GLUCOSEU >=500 (A) 06/15/2022 0836   GLUCOSEU Negative 01/23/2015 1444   HGBUR NEGATIVE 06/15/2022 0836   BILIRUBINUR NEGATIVE 06/15/2022 0836   BILIRUBINUR negative 06/20/2019 1429    BILIRUBINUR Negative 01/23/2015 1444   KETONESUR 5 (A) 06/15/2022 0836   PROTEINUR 100 (A) 06/15/2022 0836   UROBILINOGEN 0.2 06/20/2019 1429   NITRITE NEGATIVE 06/15/2022 0836   LEUKOCYTESUR TRACE (A) 06/15/2022 0836   LEUKOCYTESUR Negative 01/23/2015 1444    Radiological Exams on Admission: DG Chest Port 1 View  Result Date: 06/15/2022 CLINICAL DATA:  Worsening shortness of breath EXAM: PORTABLE CHEST 1 VIEW COMPARISON:  06/15/2022 FINDINGS: Chronic cardiomegaly. Interstitial coarsening with [employer], similar to prior and new from 2022. Indistinct airspace type density at the bases. No visible effusion or pneumothorax. Artifact from EKG leads. IMPRESSION: CHF pattern.  Cannot exclude superimposed pneumonia. Electronically Signed   By: Jorje Guild M.D.   On: 06/15/2022 06:22   DG Chest Portable 1 View  Result Date: 06/15/2022 CLINICAL DATA:  Shortness of breath onset 3 hours ago. Hypoxia with saturation 85%. EXAM: PORTABLE CHEST 1 VIEW COMPARISON:  Portable chest 02/09/2021 FINDINGS: The heart has enlarged compared to the prior study with development of vascular engorgement and flow cephalization and moderate diffuse interstitial edema with a basal gradient. There are patchy hazy opacities in the mid to lower lung fields which probably due to alveolar edema, less likely multifocal pneumonia, with scattered atelectatic bands in the lower zones. Small pleural effusions are beginning to form. There is aortic atherosclerosis with stable mediastinal configuration. There is thoracic spondylosis. IMPRESSION: Moderate CHF findings with vascular engorgement and flow cephalization and moderate interstitial edema. Additional opacities in the mid to lower lung fields most likely due to alveolar edema, less likely multifocal pneumonia, with small pleural effusions. Progress chest films recommended depending on clinical response. Electronically Signed  By: Telford Nab M.D.   On: 06/15/2022 02:03     EKG: Independently reviewed.  Sinus tachycardia, prolonged QTc, no acute ST changes.  Assessment/Plan Principal Problem:   CHF (congestive heart failure) ([place])  (please populate well all problems here in Problem List. (For example, if patient is on BP meds at home and you resume or decide to hold them, it is a problem that needs to be her. Same for CAD, COPD, HLD and so on)  Acute HFpEF decompensation -She has pulmonary edema secondary to CHF decompensation likely from uncontrolled HTN.  Went through patient occasionally with his son, appears that patient has been compliant with all her medications.  Medications handled by home care team. -Agreed with Lasix increased dose to 40 mg twice daily, monitor kidney function. -Discontinue Cardizem given acute CHF -Start Coreg twice daily -As needed hydralazine -Continue losartan and Imdur -Echocardiogram -Other DDx, question of pneumonia given the suspicious x-ray finding.  Ordered CT chest to further characterize.  Agreed with ceftriaxone and azithromycin for now, check sputum culture, atypical pneumonia study.  Check UA.  No significant skin infection.  No history of aspiration pneumonia before  Acute COPD exacerbation -Short course steroid, continue LABA, inhaled steroid, DuoNebs -Incentive spirometry -Wean down BiPAP  SIRS -Secondary to CHF decompensation and COPD exacerbation, doubt sepsis  Leukocytosis -CT chest to rule out any complication -Denies any diarrhea, no significant skin infection -CBC in a.m., antibiotics as above.  Acute hypoxic respiratory failure -Multiple factorial from COPD exacerbation CHF decompensation fluid overload, management as above.  IDDM, with hyperglycemia -Resume home dose of NovoLog 10 units 3 times daily AC, add sliding scale.  HTN -As above.  CKD stage IIIa -Creatinine level stable, on Lasix, monitor kidney function -Fluid overload, continue Lasix  Atopic dermatitis -Continue tacrolimus  topical -Continue Dupixent  DVT prophylaxis: Lovenox Code Status: DNR Family Communication: Son over the phone Disposition Plan: Patient sick with CHF and COPD and possible pneumonia on BiPAP, expect more than 2 midnight hospital stay.  Need IV Lasix and IV antibiotics. Consults called: None Admission status: PCU   Lequita Halt MD [facility] Pager [phone]  06/15/2022, 10:47 AM

## 2022-06-15 NOTE — Progress Notes (Signed)
OT Cancellation Note  Patient Details Name: [name] MRN: [id number] DOB: Nov 19, 1928   Cancelled Treatment:    Reason Eval/Treat Not Completed: Medical issues which prohibited therapy. Consult received, chart reviewed. Spoke with RN, pt currently on BiPAP. Will hold OT evaluation at this time and re-attempt at later date/time as medically appropriate.   Ardeth Perfect., MPH, MS, OTR/L ascom [phone] 06/15/22, 9:21 AM

## 2022-06-15 NOTE — ED Provider Notes (Addendum)
[facility] Provider Note    Event Date/Time   First MD Initiated Contact with Patient 06/15/22 0147     (approximate)   History   Shortness of Breath   HPI  [name] is a 88 y.o. female who presents to the ED from home with a chief complaint of shortness of breath.  Patient with a history of COPD not on home oxygen.  Endorses dry cough and progressive shortness of breath which worsened 3 hours ago.  Endorses associated chest tightness.  Room air saturations on arrival 85%; placed on 4 L nasal cannula oxygen with improvement to 94%.  Denies fever, chills, abdominal pain, nausea, vomiting or dizziness.     Past Medical History   Past Medical History:  Diagnosis Date   Angina pectoris (HCC)    Atrial fibrillation (HCC)    Basal cell carcinoma    nose   Cervicalgia    Chronic back pain    Chronic obstructive pulmonary disease (COPD) (HCC)    COPD (chronic obstructive pulmonary disease) (HCC)    DDD (degenerative disc disease), lumbar    Diabetes mellitus without complication ([place])    Dysphagia    Hyperlipemia    Hypertension    Squamous cell carcinoma of skin    left lateral pretibial   Vulvovaginitis      Active Problem List   Patient Active Problem List   Diagnosis Date Noted   Pain of left hip joint 03/08/2022   History of falling 07/14/2021   Neurocognitive disorder with Lewy bodies (CODE) ([place]) 07/07/2021   Atherosclerosis of native arteries of the extremities with ulceration ([place]) 04/07/2021   PSVT (paroxysmal supraventricular tachycardia) ([place]) 40/98/1191   Acute metabolic encephalopathy 47/82/9562   Frequent falls 02/10/2021   COVID-19 virus infection 02/10/2021   Chronic kidney disease, stage 3a ([place]) 02/10/2021   Dementia ([place]) 02/10/2021   Garbled speech, intermittent 02/10/2021   Laceration without foreign body, left ankle, subsequent encounter 02/10/2021   AMS (altered mental status) 02/10/2021   Osteoarthritis  of left shoulder 10/27/2020   Type 2 diabetes mellitus with diabetic peripheral angiopathy without gangrene ([place]) 09/27/2020   Personal history of malignant melanoma of skin 09/27/2020   Long term (current) use of insulin ([place]) 09/27/2020   Coronary artery disease involving native coronary artery of native heart 04/17/2020   Celiac artery stenosis ([place]) 06/12/2019   Bilateral lower abdominal cramping 04/20/2019   UTI symptoms 04/20/2019   History of rectal bleeding 03/26/2019   DM type 2 with diabetic peripheral neuropathy ([place]) 12/14/2018   Arthropathy of lumbar facet joint 09/04/2018   Degeneration of lumbar intervertebral disc 09/04/2018   Spondylolisthesis, grade 1 09/04/2018   Lewy body dementia ([place]) 02/17/2018   PVD (peripheral vascular disease) ([place]) 08/16/2017   Pain in limb 07/27/2016   Hallucinations 07/07/2016   Chest pain 07/01/2016   Depression 06/23/2016   Hypokalemia 09/03/2015   Insomnia 07/23/2015   Headache 07/23/2015   Type 2 diabetes mellitus with vascular disease ([place]) 06/24/2015   Chronic vulvitis 06/02/2015   Allergic rhinitis 05/30/2015   Anemia 05/30/2015   Back ache 05/30/2015   Body mass index (BMI) of 29.0-29.9 in adult 05/30/2015   Edema 05/30/2015   Dilatation of esophagus 05/30/2015   Fall 05/30/2015   H/O deep venous thrombosis 05/30/2015   Hypercholesteremia 05/30/2015   Heart & renal disease, hypertensive, with heart failure ([place]) 05/30/2015   Hyponatremia 05/30/2015   Calculus of kidney 05/30/2015   Gonalgia 05/30/2015  Psoriasis 05/30/2015   Avitaminosis D 05/30/2015   Cough 04/01/2015   Combined fat and carbohydrate induced hyperlipemia 12/30/2014   Paroxysmal atrial fibrillation (HCC) 07/10/2014   Abnormal gait 07/08/2014   Anxiety state 07/08/2014   Chronic kidney disease 07/08/2014   Acid reflux 07/08/2014   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[place]) 07/08/2014   Chronic obstructive pulmonary disease ([place]) 07/08/2014   Type II diabetes  mellitus with renal manifestations ([place]) 07/08/2014   Benign essential HTN 06/26/2014   Carotid artery narrowing 06/26/2014   Myalgia 06/26/2014   Basal cell carcinoma of face 05/01/2014   Disease of female genital organs 11/28/2012   Incomplete bladder emptying 09/05/2012   Excessive urination at night 08/15/2012   Basal cell carcinoma of ear 10/26/2011     Past Surgical History   Past Surgical History:  Procedure Laterality Date   gall bladder     melanoma     removal neck and back   PERIPHERAL VASCULAR CATHETERIZATION Left 01/05/2016   Procedure: Lower Extremity Angiography;  Surgeon: Algernon Huxley, MD;  Location: [place] [facility];  Service: Cardiovascular;  Laterality: Left;   PERIPHERAL VASCULAR CATHETERIZATION  01/05/2016   Procedure: Lower Extremity Intervention;  Surgeon: Algernon Huxley, MD;  Location: [place] [facility];  Service: Cardiovascular;;   ureterolithiasis     calculus removed   VAGINAL HYSTERECTOMY     VISCERAL ANGIOGRAPHY N/A 05/10/2019   Procedure: VISCERAL ANGIOGRAPHY;  Surgeon: Algernon Huxley, MD;  Location: [place] [facility];  Service: Cardiovascular;  Laterality: N/A;   VULVA / PERINEUM BIOPSY  05/29/2015     Home Medications   Prior to Admission medications   Medication Sig Start Date End Date Taking? Authorizing Provider  ADVAIR HFA 230-21 MCG/ACT inhaler TAKE 2 PUFFS INTO LUNGS TWICE A DAY 02/04/21   Flora Lipps, MD  albuterol (VENTOLIN HFA) 108 (90 Base) MCG/ACT inhaler INHALE 2 PUFFS INTO THE LUNGS EVERY 4 HOURS AS NEEDED FOR WHEEZING ORSHORTNESS OF BREATH 04/12/22   Jerrol Banana., MD  aspirin 325 MG tablet Take 325 mg by mouth daily. Reported on 01/05/2016    [provider]  atorvastatin (LIPITOR) 10 MG tablet TAKE 1 TABLET BY MOUTH ONCE DAILY. 04/06/22   Jerrol Banana., MD  cetirizine (ZYRTEC) 10 MG tablet TAKE ONE TABLET BY MOUTH EVERY DAY 12/12/18   Jerrol Banana., MD  cholecalciferol (VITAMIN D3) 25 MCG (1000  UT) tablet Take 1,000 Units by mouth daily.    [provider]  clopidogrel (PLAVIX) 75 MG tablet TAKE 1 TABLET BY MOUTH DAILY 04/06/22   Jerrol Banana., MD  diltiazem (TIADYLT ER) 360 MG 24 hr capsule Hold until followup with your outpatient doctor due to your blood pressure on the low side. 02/10/21   Enzo Bi, MD  Dupilumab (DUPIXENT) 300 MG/2ML SOPN Inject 300 mg into the skin every 14 (fourteen) days. Starting at day 15 for maintenance. 03/10/22   Ralene Bathe, MD  ezetimibe (ZETIA) 10 MG tablet TAKE 1 TABLET BY MOUTH DAILY 04/30/22   Jerrol Banana., MD  FLUoxetine (PROZAC) 10 MG capsule TAKE 1 CAPSULE BY MOUTH 3 TIMES DAILY. 04/30/22   Jerrol Banana., MD  glucose blood test strip ACCU-CHEK AVIVA PLUS TEST STRP Use to check sugars 3 times daily. 10/28/15   [provider]  HUMALOG KWIKPEN 100 UNIT/ML KwikPen Inject into the skin. 10/06/21   [provider]  insulin glargine (LANTUS) 100 UNIT/ML injection  Inject 0.05 mLs (5 Units total) into the skin at bedtime. 07/02/16   Epifanio Lesches, MD  insulin lispro (HUMALOG) 100 UNIT/ML KiwkPen Inject 10 Units into the skin 3 (three) times daily. 10 units before breakfast, 8 before lunch and 18 before supper    [provider]  iron polysaccharides (NIFEREX) 150 MG capsule Take 1 capsule (150 mg total) by mouth daily. 02/13/21 03/15/21  Sharen Hones, MD  isosorbide mononitrate (IMDUR) 30 MG 24 hr tablet Take 30 mg by mouth daily. 06/12/20   [provider]  lansoprazole (PREVACID) 30 MG capsule TAKE 1 CAPSULE BY MOUTH ONCE DAILY AT NOON 06/01/22   Jerrol Banana., MD  losartan (COZAAR) 100 MG tablet TAKE 1 TABLET BY MOUTH DAILY 06/01/22   Jerrol Banana., MD  magnesium oxide (MAG-OX) 400 MG tablet Take 400 mg by mouth daily.    [provider]  meloxicam (MOBIC) 7.5 MG tablet Take 1 tablet (7.5 mg total) by mouth daily as needed for pain. 03/18/20   Jerrol Banana., MD  memantine (NAMENDA) 10 MG tablet Take 10 mg by mouth 2 (two) times daily. 12/31/21   [provider]  montelukast (SINGULAIR) 10 MG tablet TAKE ONE TABLET AT BEDTIME 12/03/20   Jerrol Banana., MD  ondansetron (ZOFRAN-ODT) 4 MG disintegrating tablet Take 4 mg by mouth every 8 (eight) hours as needed for nausea. For up to 12 doses. 02/06/21   [provider]  pantoprazole (PROTONIX) 40 MG tablet Take 40 mg by mouth daily.    [provider]  QUEtiapine (SEROQUEL) 50 MG tablet Take 50 mg by mouth at bedtime. 11/09/21   [provider]  tacrolimus (PROTOPIC) 0.1 % ointment Apply topically 2 (two) times daily. Apply to aa's of rash at body 02/24/22   Ralene Bathe, MD  traZODone (DESYREL) 50 MG tablet TAKE 1 AND 1/2 TABLET AT BEDTIME AS NEEDED FOR SLEEP 05/01/21   Jerrol Banana., MD  TUSSIN DM 100-10 MG/5ML liquid TAKE 1 TEASPOONFUL BY MOUTH EVERY 4 HOURS AS NEEDED FOR COUGH Patient not taking: Reported on 05/20/2022 08/20/19   Flora Lipps, MD  vitamin B-12 1000 MCG tablet Take 1 tablet (1,000 mcg total) by mouth daily. 02/13/21   Sharen Hones, MD  vitamin E 1000 UNIT capsule Take 1,000 Units by mouth daily.    [provider]     Allergies  Bacitracin-neomycin-polymyxin, Neomycin-bacitracin zn-polymyx, Latex, Lidocaine, Aricept [donepezil hcl], Benzalkonium chloride, Ibuprofen, Lidocaine hcl, Valdecoxib, Albuterol, Tape, and Triamcinolone   Family History   Family History  Problem Relation Age of Onset   Ovarian cancer Other    Diabetes Sister    Colon cancer Brother    Diabetes Brother    Atrial fibrillation Daughter    Breast cancer Neg Hx      Physical Exam  Triage Vital Signs: ED Triage Vitals  Enc Vitals Group     BP 06/15/22 0141 (!) 199/66     Pulse Rate 06/15/22 0141 97     Resp 06/15/22 0141 (!) 30     Temp 06/15/22 0141 97.9 F (36.6 C)     Temp Source 06/15/22 0141 Oral     SpO2 06/15/22 0141 (!) 85 %      Weight 06/15/22 0145 136 lb 11 oz (62 kg)     Height 06/15/22 0145 '5\' 2"'$  (1.575 m)     Head Circumference --      Peak Flow --  Pain Score 06/15/22 0147 3     Pain Loc --      Pain Edu? --      Excl. in [place]? --     Updated Vital Signs: BP (!) 206/100   Pulse (!) 118   Temp 97.9 F (36.6 C) (Oral)   Resp (!) 32   Ht '5\' 2"'$  (1.575 m)   Wt 62 kg   SpO2 (!) 88%   BMI 25.00 kg/m    General: Awake, moderate distress.  CV:  RRR.  Good peripheral perfusion.  Resp:  Increased effort.  Diminished aeration. Abd:  Nontender.  No distention.  Other:  Calves are nonswollen and nontender.   ED Results / Procedures / Treatments  Labs (all labs ordered are listed, but only abnormal results are displayed) Labs Reviewed  CBC WITH DIFFERENTIAL/PLATELET - Abnormal; Notable for the following components:      Result Value   WBC 23.5 (*)    Hemoglobin 9.9 (*)    HCT 33.3 (*)    MCV 75.5 (*)    MCH 22.4 (*)    MCHC 29.7 (*)    RDW 18.0 (*)    Neutro Abs 19.5 (*)    Monocytes Absolute 1.8 (*)    Abs Immature Granulocytes 0.16 (*)    All other components within normal limits  COMPREHENSIVE METABOLIC PANEL - Abnormal; Notable for the following components:   Glucose, Bld 272 (*)    Creatinine, Ser 1.07 (*)    Calcium 8.5 (*)    GFR, Estimated 49 (*)    All other components within normal limits  LACTIC ACID, PLASMA - Abnormal; Notable for the following components:   Lactic Acid, Venous 2.1 (*)    All other components within normal limits  TROPONIN I (HIGH SENSITIVITY) - Abnormal; Notable for the following components:   Troponin I (High Sensitivity) 19 (*)    All other components within normal limits  TROPONIN I (HIGH SENSITIVITY) - Abnormal; Notable for the following components:   Troponin I (High Sensitivity) 74 (*)    All other components within normal limits  SARS CORONAVIRUS 2 BY RT PCR  CULTURE, BLOOD (ROUTINE X 2)  CULTURE, BLOOD (ROUTINE X 2)  LACTIC ACID, PLASMA   PROCALCITONIN  BRAIN NATRIURETIC PEPTIDE     EKG  ED ECG REPORT I, Ramez Arrona J, the attending physician, personally viewed and interpreted this ECG.   Date: 06/15/2022  EKG Time: 0203  Rate: 91  Rhythm: Normal sinus rhythm  Axis: Normal  Intervals:none  ST&T Change: Nonspecific    RADIOLOGY I have independently visualized and interpreted patient's chest x-ray as well as noted the radiology interpretation:  X-ray: Pulmonary vascular congestion and interstitial edema;?  Underlying opacities  Official radiology report(s): DG Chest Port 1 View  Result Date: 06/15/2022 CLINICAL DATA:  Worsening shortness of breath EXAM: PORTABLE CHEST 1 VIEW COMPARISON:  06/15/2022 FINDINGS: Chronic cardiomegaly. Interstitial coarsening with [employer], similar to prior and new from 2022. Indistinct airspace type density at the bases. No visible effusion or pneumothorax. Artifact from EKG leads. IMPRESSION: CHF pattern.  Cannot exclude superimposed pneumonia. Electronically Signed   By: Jorje Guild M.D.   On: 06/15/2022 06:22   DG Chest Portable 1 View  Result Date: 06/15/2022 CLINICAL DATA:  Shortness of breath onset 3 hours ago. Hypoxia with saturation 85%. EXAM: PORTABLE CHEST 1 VIEW COMPARISON:  Portable chest 02/09/2021 FINDINGS: The heart has enlarged compared to the prior study with development of vascular engorgement and flow  cephalization and moderate diffuse interstitial edema with a basal gradient. There are patchy hazy opacities in the mid to lower lung fields which probably due to alveolar edema, less likely multifocal pneumonia, with scattered atelectatic bands in the lower zones. Small pleural effusions are beginning to form. There is aortic atherosclerosis with stable mediastinal configuration. There is thoracic spondylosis. IMPRESSION: Moderate CHF findings with vascular engorgement and flow cephalization and moderate interstitial edema. Additional opacities in the mid to lower  lung fields most likely due to alveolar edema, less likely multifocal pneumonia, with small pleural effusions. Progress chest films recommended depending on clinical response. Electronically Signed   By: Telford Nab M.D.   On: 06/15/2022 02:03     PROCEDURES:  Critical Care performed: Yes, see critical care procedure note(s)  CRITICAL CARE Performed by: Paulette Blanch   Total critical care time: 45 minutes  Critical care time was exclusive of separately billable procedures and treating other patients.  Critical care was necessary to treat or prevent imminent or life-threatening deterioration.  Critical care was time spent personally by me on the following activities: development of treatment plan with patient and/or surrogate as well as nursing, discussions with consultants, evaluation of patient's response to treatment, examination of patient, obtaining history from patient or surrogate, ordering and performing treatments and interventions, ordering and review of laboratory studies, ordering and review of radiographic studies, pulse oximetry and re-evaluation of patient's condition.  Marland Kitchen1-3 Lead EKG Interpretation  Performed by: Paulette Blanch, MD Authorized by: Paulette Blanch, MD     Interpretation: normal     ECG rate:  97   ECG rate assessment: normal     Rhythm: sinus rhythm     Ectopy: none     Conduction: normal   Comments:     Patient placed on cardiac monitor to evaluate for arrhythmias    MEDICATIONS ORDERED IN ED: Medications  cefTRIAXone (ROCEPHIN) 2 g in sodium chloride 0.9 % 100 mL IVPB (0 g Intravenous Stopped 06/15/22 0436)  azithromycin (ZITHROMAX) 500 mg in sodium chloride 0.9 % 250 mL IVPB (500 mg Intravenous New Bag/Given 06/15/22 0541)  ipratropium-albuterol (DUONEB) 0.5-2.5 (3) MG/3ML nebulizer solution 3 mL ( Nebulization Not Given 06/15/22 0619)  methylPREDNISolone sodium succinate (SOLU-MEDROL) 125 mg/2 mL injection 125 mg (125 mg Intravenous Given 06/15/22 0202)   sodium chloride 0.9 % bolus 500 mL (0 mLs Intravenous Stopped 06/15/22 0436)  furosemide (LASIX) injection 20 mg (20 mg Intravenous Given 06/15/22 0608)  ondansetron (ZOFRAN) injection 4 mg (4 mg Intravenous Given 06/15/22 1610)     IMPRESSION / MDM / ASSESSMENT AND PLAN / ED COURSE  I reviewed the triage vital signs and the nursing notes.                             88 year old female presenting in respiratory distress with hypoxia. Differential includes, but is not limited to, viral syndrome, bronchitis including COPD exacerbation, pneumonia, reactive airway disease including asthma, CHF including exacerbation with or without pulmonary/interstitial edema, pneumothorax, ACS, thoracic trauma, and pulmonary embolism.  I have personally reviewed patient's records and note her endocrinology office visit on 05/18/2022.  Patient's presentation is most consistent with acute presentation with potential threat to life or bodily function.  The patient is on the cardiac monitor to evaluate for evidence of arrhythmia and/or significant heart rate changes.  We will obtain lab work, chest x-ray, COVID swab.  Will administer DuoNeb and Solu-Medrol.  Patient  has a documented albuterol allergy in the computer but takes albuterol inhaler as a home medication.  Also has a documented allergy to triamcinolone in terms of rash; feel benefit outweighs risk in this case to administer IV Solu-Medrol.  Anticipate hospitalization.  Clinical Course as of 06/15/22 6203  Tue Jun 15, 2022  0209 Leukocytosis noted.  Have added blood cultures, lactic acid and procalcitonin.  Chest x-ray demonstrative of CHF; does not look like patient has a documented history of such.  Will see what patient's renal function is and consider IV diuretic. [JS]  0336 Lactic acid is minimally elevated.  Will administer empiric broad-spectrum IV antibiotics.  Will administer judicious fluids given vascular congestion seen on chest x-ray. [JS]  0604  Patient complaining of increased shortness of breath.  Rales auscultated.  Repeat temperature 97.9 F.  Will administer low-dose IV Lasix. [JS]  [id number] Patient placed on BiPAP; improving on BiPAP. [JS]    Clinical Course User Index [JS] Paulette Blanch, MD     FINAL CLINICAL IMPRESSION(S) / ED DIAGNOSES   Final diagnoses:  COPD exacerbation ([place])  Hypoxia  Sepsis, due to unspecified organism, unspecified whether acute organ dysfunction present [facility])  Community acquired pneumonia, unspecified laterality  Acute congestive heart failure, unspecified heart failure type ([place])     Rx / DC Orders   ED Discharge Orders     None        Note:  This document was prepared using Dragon voice recognition software and may include unintentional dictation errors.   Paulette Blanch, MD 06/15/22 5597    Paulette Blanch, MD 06/15/22 4163    Paulette Blanch, MD 06/15/22 [phone]

## 2022-06-15 NOTE — ED Notes (Signed)
RT called and state they are bringing bipap to pt room

## 2022-06-15 NOTE — ED Notes (Signed)
Pt desat to 83%, placed on 3 L [place]

## 2022-06-15 NOTE — Progress Notes (Signed)
PT Cancellation Note  Patient Details Name: TATE JERKINS MRN: [id number] DOB: [birth year]   Cancelled Treatment:    Reason Eval/Treat Not Completed: Medical issues which prohibited therapy.  Chart reviewed and spoke with OT and RN via secure chat.  Pt currently on BiPAP, so will hold PT evaluation at this time and re-attempt as medically appropriate.    Gwenlyn Saran, PT, DPT 06/15/22, 12:56 PM

## 2022-06-15 NOTE — ED Notes (Signed)
Patient provided with ice pack for back of neck per request.

## 2022-06-15 NOTE — Inpatient Diabetes Management (Signed)
Inpatient Diabetes Program Recommendations  AACE/ADA: New Consensus Statement on Inpatient Glycemic Control   Target Ranges:  Prepandial:   less than 140 mg/dL      Peak postprandial:   less than 180 mg/dL (1-2 hours)      Critically ill patients:  140 - 180 mg/dL    Latest Reference Range & Units 06/15/22 08:43 06/15/22 11:57  Glucose-Capillary 70 - 99 mg/dL 344 (H) 380 (H)  Novolog 19 units    Review of Glycemic Control  Diabetes history: DM2 Outpatient Diabetes medications: Lantus 5 units QHS, Humalog 10 units with breakfast, 8 units with lunch, 18 units with supper; Dexcom CGM Current orders for Inpatient glycemic control: Novolog 10 units TID with meals, Novolog 0-9 units TID with meals  Inpatient Diabetes Program Recommendations:    Insulin: If glucose is consistently over 180 mg/dl with ordered meal coverage and correction, please consider ordering Semglee 6 units Q24H  NOTE: In reviewing chart, noted patient sees Dr. Gabriel Carina (Endocrinologist) and was last seen 05/18/22. Per office note on 05/18/22, "Since last visit, she elected to reduce both her Lantus and Humalog doses due to concerns about low blood sugars. Patient reports she is fearful of lows and states that she will have a whole hostess cupcake or multiple cookies.  Continue Lantus 12 units QHS, take Humalog 12 units before breakfast, Humalog 16 units before lunch and supper."   Thanks, Barnie Alderman, RN, MSN, [place] Diabetes Coordinator Inpatient Diabetes Program [phone] (Team Pager from 8am to [place])

## 2022-06-15 NOTE — Evaluation (Signed)
Occupational Therapy Evaluation Patient Details Name: [name] MRN: [id number] DOB: 10/29/1928 Today's Date: 06/15/2022   History of Present Illness 88 y.o. female with PMHx includes Afib, cervicalgia, COPD, DDD, DM, HLD, HTN, and dysphagia who presents to the ED from home with a chief complaint of shortness of breath.  Patient with a history of COPD not on home oxygen.  Endorses dry cough and progressive shortness of breath which worsened 3 hours ago.  Endorses associated chest tightness.   Clinical Impression   Pt was seen for OT evaluation this date. Prior to hospital admission, pt was ambulating with 2WW and indep with most ADL. She lives by herself and has adult children who assist with showering, meals, and transportation. Pt presents to acute OT demonstrating impaired ADL performance and functional mobility 2/2 impaired cardiopulmonary status requiring 3L O2 for session, balance, and activity tolerance (See OT problem list). Pt currently requires supervision for seated LB ADL tasks, CGA for ADL transfers with RW, and monitoring of O2. SpO2 remained 98-99% on 3L with exertion but pt endorsing mild SOB. HR in 90's throughout. Pt/family educated in PLB and ECS to support safety/indep with ADL/IADL. Pt would benefit from skilled OT services while hospitalized to address noted impairments and functional limitations (see below for any additional details) in order to maximize safety and independence while minimizing falls risk and caregiver burden.     Recommendations for follow up therapy are one component of a multi-disciplinary discharge planning process, led by the attending physician.  Recommendations may be updated based on patient status, additional functional criteria and insurance authorization.   Follow Up Recommendations  No OT follow up    Assistance Recommended at Discharge Intermittent Supervision/Assistance  Patient can return home with the following Assistance with  cooking/housework;Assist for transportation;Help with stairs or ramp for entrance;A little help with bathing/dressing/bathroom    Functional Status Assessment  Patient has had a recent decline in their functional status and demonstrates the ability to make significant improvements in function in a reasonable and predictable amount of time.  Equipment Recommendations  None recommended by OT    Recommendations for Other Services       Precautions / Restrictions Precautions Precautions: Fall Restrictions Weight Bearing Restrictions: No      Mobility Bed Mobility Overal bed mobility: Modified Independent                  Transfers Overall transfer level: Needs assistance Equipment used: Rolling walker (2 wheels) Transfers: Sit to/from Stand Sit to Stand: Supervision                  Balance Overall balance assessment: Needs assistance Sitting-balance support: No upper extremity supported, Feet supported Sitting balance-Leahy Scale: Good     Standing balance support: Bilateral upper extremity supported, During functional activity Standing balance-Leahy Scale: Good                             ADL either performed or assessed with clinical judgement   ADL                                         General ADL Comments: Pt managed socks seated EOB without assist but did have some difficulty (pt endorses some neuropathy in hands at baseline), SBA-CGA for ADL transfers with RW.     Vision  Perception     Praxis      Pertinent Vitals/Pain Pain Assessment Pain Assessment: No/denies pain     Hand Dominance     Extremity/Trunk Assessment Upper Extremity Assessment Upper Extremity Assessment: Generalized weakness;Overall WFL for tasks assessed (age appropriate)   Lower Extremity Assessment Lower Extremity Assessment: Generalized weakness;Overall WFL for tasks assessed (age appropriate)       Communication  Communication Communication: HOH   Cognition Arousal/Alertness: Awake/alert Behavior During Therapy: WFL for tasks assessed/performed Overall Cognitive Status: Within Functional Limits for tasks assessed                                       General Comments  On 3L throughout, SpO2 98-99% with exertion, HR in 90's, pt endorsing mild SOB with exertion.    Exercises Other Exercises Other Exercises: Pt/family educated in PLB and ECS   Shoulder Instructions      Home Living Family/patient expects to be discharged to:: Private residence Living Arrangements: Alone Available Help at Discharge: Family;Available PRN/intermittently (son, daughter, daughter in law) Type of Home: House Home Access: Stairs to enter Technical brewer of Steps: 2   Home Layout: One level     Bathroom Shower/Tub: Teacher, early years/pre: Handicapped height     Home Equipment: Conservation officer, nature (2 wheels);Shower seat          Prior Functioning/Environment Prior Level of Function : Needs assist             Mobility Comments: ambulates with a 2WW ADLs Comments: Indep with dressing, toileting, and grooming; goes to her daughter's house to shower (walk in shower, shower chair, dtr helps with transfers and washing her back), and family also assists with transportation and meals. Pharmacy provides weekly pill box prefilled for pt's meds. Denies falls in past 65mo        OT Problem List: Decreased activity tolerance;Impaired balance (sitting and/or standing);Decreased knowledge of use of DME or AE;Cardiopulmonary status limiting activity      OT Treatment/Interventions: Self-care/ADL training;Therapeutic activities;Energy conservation;DME and/or AE instruction;Patient/family education;Balance training    OT Goals(Current goals can be found in the care plan section) Acute Rehab OT Goals Patient Stated Goal: breathe better and go home OT Goal Formulation: With  patient/family Time For Goal Achievement: 06/29/22 Potential to Achieve Goals: Good ADL Goals Pt Will Perform Lower Body Dressing: with modified independence;sit to/from stand Pt Will Transfer to Toilet: with modified independence;ambulating (LRAD, RW) Pt Will Perform Toileting - Clothing Manipulation and hygiene: with modified independence Additional ADL Goal #1: Pt will complete ADL/mobility tasks with mod indep on room air with SpO2 >95% and no SOB, 5/5 opportunities.  OT Frequency: Min 2X/week    Co-evaluation              AM-PAC OT "6 Clicks" Daily Activity     Outcome Measure Help from another person eating meals?: None Help from another person taking care of personal grooming?: None Help from another person toileting, which includes using toliet, bedpan, or urinal?: A Little Help from another person bathing (including washing, rinsing, drying)?: A Little Help from another person to put on and taking off regular upper body clothing?: None Help from another person to put on and taking off regular lower body clothing?: A Little 6 Click Score: 21   End of Session Equipment Utilized During Treatment: Gait belt;Oxygen;Rolling walker (2 wheels) Nurse Communication: Mobility status  Activity Tolerance: Patient tolerated treatment well Patient left: in bed;with call bell/phone within reach;with bed alarm set;with family/visitor present  OT Visit Diagnosis: Other abnormalities of gait and mobility (R26.89)                Time: 5625-6389 OT Time Calculation (min): 32 min Charges:  OT General Charges $OT Visit: 1 Visit OT Evaluation $OT Eval Low Complexity: 1 Low OT Treatments $Self Care/Home Management : 23-37 mins  Ardeth Perfect., MPH, MS, OTR/L ascom [phone] 06/15/22, 5:01 PM

## 2022-06-15 NOTE — ED Triage Notes (Signed)
Pt presents via POV with complaints of SOB that started 3 hours ago. Hx of COPD - no oxygen requirement at home. Pts sats are 85% on RA - placed on 2L [place] and sats improved to 90%; pt increased to 4L [place] sats improved to 94%.Denies CP.

## 2022-06-15 NOTE — ED Notes (Signed)
Bipap placed by RT. Pt notably improved already with breathing less labored and less noted anxiety. Pt reports feeling slightly better with better ease of breathing. HTN still present but noted to be improved. Same with tachycardia. Spo2 now WNL.

## 2022-06-15 NOTE — ED Notes (Signed)
MD called to room d/t pt c/o of back pain, h/a, and return of SOB. Pt tachypneic and with shallow RR. Crackles noted in middle and lower lobes with moist cough. Pt notably anxious. Pt also reports feeling hot. Temperature checked and pt 97.9 oral. Pt noted to have worsening HTN on monitor with tachycardia. Repeat EKG completed and given to provider. Pt also noted to be diaphoretic. Provider placed new orders and orders carried out.

## 2022-06-15 NOTE — Progress Notes (Signed)
CT chest showed multifocal pneumonia mainly on bilateral lower lobes, with concern about aspiration pneumonia.  Speech evaluation, will keep patient n.p.o. for now.  CT chest also shows concurrent evidence of pulmonary congestion/etc, continue treating both CHF and pneumonia continue ceftriaxone and azithromycin, and Lasix.

## 2022-06-16 DIAGNOSIS — J189 Pneumonia, unspecified organism: Secondary | ICD-10-CM | POA: Diagnosis not present

## 2022-06-16 DIAGNOSIS — J441 Chronic obstructive pulmonary disease with (acute) exacerbation: Secondary | ICD-10-CM

## 2022-06-16 DIAGNOSIS — I5031 Acute diastolic (congestive) heart failure: Secondary | ICD-10-CM | POA: Diagnosis not present

## 2022-06-16 LAB — GLUCOSE, CAPILLARY
Glucose-Capillary: 116 mg/dL — ABNORMAL HIGH (ref 70–99)
Glucose-Capillary: 111 mg/dL — ABNORMAL HIGH (ref 70–99)
Glucose-Capillary: 91 mg/dL (ref 70–99)
Glucose-Capillary: 119 mg/dL — ABNORMAL HIGH (ref 70–99)
Glucose-Capillary: 76 mg/dL (ref 70–99)

## 2022-06-16 LAB — BASIC METABOLIC PANEL
Anion gap: 12 (ref 5–15)
BUN: 31 mg/dL — ABNORMAL HIGH (ref 8–23)
CO2: 26 mmol/L (ref 22–32)
Calcium: 8.5 mg/dL — ABNORMAL LOW (ref 8.9–10.3)
Chloride: 101 mmol/L (ref 98–111)
Creatinine, Ser: 1.35 mg/dL — ABNORMAL HIGH (ref 0.44–1.00)
GFR, Estimated: 37 mL/min — ABNORMAL LOW (ref >60)
Glucose, Bld: 138 mg/dL — ABNORMAL HIGH (ref 70–99)
Potassium: 3.6 mmol/L (ref 3.5–5.1)
Sodium: 139 mmol/L (ref 135–145)

## 2022-06-16 LAB — ECHOCARDIOGRAM COMPLETE
Height: 62 in
S' Lateral: 3.2 cm
Weight: 2186.96 oz

## 2022-06-16 LAB — LEGIONELLA PNEUMOPHILA SEROGP 1 UR AG: L. pneumophila Serogp 1 Ur Ag: NEGATIVE

## 2022-06-16 LAB — PROCALCITONIN: Procalcitonin: 1.81 ng/mL

## 2022-06-16 LAB — MYCOPLASMA PNEUMONIAE ANTIBODY, IGM: Mycoplasma pneumo IgM: <770 U/mL (ref 0–769)

## 2022-06-16 MED ORDER — INSULIN GLARGINE-YFGN 100 UNIT/ML ~~LOC~~ SOLN
8.0000 [IU] | Freq: Every day | SUBCUTANEOUS | Status: DC
  Administered 2022-06-17: 8 [IU] via SUBCUTANEOUS
  Filled 2022-06-16 (×2): qty 0.08

## 2022-06-16 MED ORDER — VITAMIN E 180 MG (400 UNIT) PO CAPS
800.0000 [IU] | ORAL_CAPSULE | Freq: Every day | ORAL | Status: DC
  Administered 2022-06-17 – 2022-06-18 (×3): 800 [IU] via ORAL
  Filled 2022-06-16 (×4): qty 2

## 2022-06-16 MED ORDER — INSULIN ASPART 100 UNIT/ML IJ SOLN
6.0000 [IU] | Freq: Three times a day (TID) | INTRAMUSCULAR | Status: DC
  Administered 2022-06-16 – 2022-06-17 (×5): 6 [IU] via SUBCUTANEOUS
  Filled 2022-06-16 (×5): qty 1

## 2022-06-16 NOTE — Progress Notes (Addendum)
[facility] NAME: [name]    MR#:  [id number]  DATE OF BIRTH:  Jan 27, 1929  SUBJECTIVE:  patient seen earlier. Sitting in the recliner chair. Told me she slept very good. Shortness of breath much improved. Overall feeling well. No fever. No family at bedside.    VITALS:  Blood pressure (!) 101/54, pulse 84, temperature 97.9 F (36.6 C), temperature source Oral, resp. rate 16, height '5\' 2"'$  (1.575 m), weight 63 kg, SpO2 100 %.  PHYSICAL EXAMINATION:   GENERAL:  88 y.o.-year-old patient lying in the bed with no acute distress.  LUNGS: Normal breath sounds bilaterally, few crackles CARDIOVASCULAR: S1, S2 normal. No murmurs, rubs, or gallops.  ABDOMEN: Soft, nontender, nondistended. Bowel sounds present.  EXTREMITIES: No  edema b/l.    NEUROLOGIC: nonfocal  patient is alert and awake Skin: no active rash LABORATORY PANEL:  CBC Recent Labs  Lab 06/15/22 0154  WBC 23.5*  HGB 9.9*  HCT 33.3*  PLT 346    Chemistries  Recent Labs  Lab 06/15/22 0154 06/16/22 0432  NA 140 139  K 3.8 3.6  CL 103 101  CO2 23 26  GLUCOSE 272* 138*  BUN 21 31*  CREATININE 1.07* 1.35*  CALCIUM 8.5* 8.5*  AST 20  --   ALT 12  --   ALKPHOS 95  --   BILITOT 0.9  --    Cardiac Enzymes No results for input(s): "TROPONINI" in the last 168 hours. RADIOLOGY:  ECHOCARDIOGRAM COMPLETE  Result Date: 06/16/2022    ECHOCARDIOGRAM REPORT   Patient Name:   [name] Date of Exam: 06/15/2022 Medical Rec #:  [id number]            Height:       62.0 in Accession #:    [id number]           Weight:       136.7 lb Date of Birth:  1929-03-09            BSA:          1.626 m Patient Age:    88 years             BP:           148/77 mmHg Patient Gender: F                    HR:           84 bpm. Exam Location:  [facility] Procedure: 2D Echo, Cardiac Doppler and Color Doppler Indications:     B35.32 Acute Diastolic CHF  History:         Patient has no  prior history of Echocardiogram examinations.                  COPD, Arrythmias:Atrial Fibrillation; Risk                  Factors:Hypertension, Dyslipidemia and Diabetes.  Sonographer:     Cresenciano Lick RDCS Referring Phys:  [id number] Lequita Halt Diagnosing Phys: Nelva Bush MD IMPRESSIONS  1. Left ventricular ejection fraction, by estimation, is 45 to 50%. The left ventricle has mildly decreased function. The left ventricle demonstrates global hypokinesis. There is mild left ventricular hypertrophy. Left ventricular diastolic parameters are indeterminate.  2. Right ventricular systolic function is mildly reduced. The right ventricular size is normal. There is normal pulmonary artery systolic pressure.  3. Left atrial size was  mildly dilated.  4. The mitral valve is degenerative. Mild mitral valve regurgitation.  5. The aortic valve is tricuspid. There is mild calcification of the aortic valve. There is mild thickening of the aortic valve. Aortic valve regurgitation is not visualized. Aortic valve sclerosis is present, with no evidence of aortic valve stenosis.  6. The inferior vena cava is normal in size with <50% respiratory variability, suggesting right atrial pressure of 8 mmHg. FINDINGS  Left Ventricle: Left ventricular ejection fraction, by estimation, is 45 to 50%. The left ventricle has mildly decreased function. The left ventricle demonstrates global hypokinesis. The left ventricular internal cavity size was normal in size. There is  mild left ventricular hypertrophy. Left ventricular diastolic function could not be evaluated due to atrial fibrillation. Left ventricular diastolic parameters are indeterminate. Right Ventricle: The right ventricular size is normal. No increase in right ventricular wall thickness. Right ventricular systolic function is mildly reduced. There is normal pulmonary artery systolic pressure. The tricuspid regurgitant velocity is 2.18 m/s, and with an assumed right  atrial pressure of 8 mmHg, the estimated right ventricular systolic pressure is 84.1 mmHg. Left Atrium: Left atrial size was mildly dilated. Right Atrium: Right atrial size was normal in size. Pericardium: There is no evidence of pericardial effusion. Mitral Valve: The mitral valve is degenerative in appearance. There is mild thickening of the mitral valve leaflet(s). Mild mitral valve regurgitation. Tricuspid Valve: The tricuspid valve is normal in structure. Tricuspid valve regurgitation is trivial. Aortic Valve: The aortic valve is tricuspid. There is mild calcification of the aortic valve. There is mild thickening of the aortic valve. Aortic valve regurgitation is not visualized. Aortic valve sclerosis is present, with no evidence of aortic valve stenosis. Pulmonic Valve: The pulmonic valve was not well visualized. Pulmonic valve regurgitation is not visualized. No evidence of pulmonic stenosis. Aorta: The aortic root and ascending aorta are structurally normal, with no evidence of dilitation. Venous: The inferior vena cava is normal in size with less than 50% respiratory variability, suggesting right atrial pressure of 8 mmHg. IAS/Shunts: No atrial level shunt detected by color flow Doppler.  LEFT VENTRICLE PLAX 2D LVIDd:         4.30 cm LVIDs:         3.20 cm LV PW:         1.30 cm LV IVS:        1.20 cm LVOT diam:     2.00 cm LV SV:         36 LV SV Index:   22 LVOT Area:     3.14 cm  RIGHT VENTRICLE             IVC RV Basal diam:  3.50 cm     IVC diam: 1.50 cm RV S prime:     11.80 cm/s TAPSE (M-mode): 1.6 cm LEFT ATRIUM             Index        RIGHT ATRIUM           Index LA diam:        4.00 cm 2.46 cm/m   RA Area:     13.90 cm LA Vol (A2C):   81.8 ml 50.30 ml/m  RA Volume:   33.60 ml  20.66 ml/m LA Vol (A4C):   42.3 ml 26.01 ml/m LA Biplane Vol: 60.4 ml 37.14 ml/m  AORTIC VALVE LVOT Vmax:   69.58 cm/s LVOT Vmean:  49.240 cm/s LVOT VTI:  0.115 m  AORTA Ao Root diam: 3.30 cm Ao Asc diam:  3.50 cm  MV E velocity: 149.00 cm/s  TRICUSPID VALVE                             TR Peak grad:   19.0 mmHg                             TR Vmax:        218.00 cm/s                              SHUNTS                             Systemic VTI:  0.12 m                             Systemic Diam: 2.00 cm Nelva Bush MD Electronically signed by Nelva Bush MD Signature Date/Time: 06/16/2022/7:04:40 AM    Final    CT CHEST WO CONTRAST  Result Date: 06/15/2022 CLINICAL DATA:  Pneumonia, complication suspected, xray done EXAM: CT CHEST WITHOUT CONTRAST TECHNIQUE: Multidetector CT imaging of the chest was performed following the standard protocol without IV contrast. RADIATION DOSE REDUCTION: This exam was performed according to the departmental dose-optimization program which includes automated exposure control, adjustment of the mA and/or kV according to patient size and/or use of iterative reconstruction technique. COMPARISON:  Chest x-ray the same day.  CT of the chest 03/31/2020. FINDINGS: Cardiovascular: Mild cardiomegaly. Coronary artery and aortic atherosclerosis. No pericardial effusion. Mediastinum/Nodes: No enlarged mediastinal or axillary lymph nodes. Thyroid gland, trachea, and esophagus demonstrate no significant findings. Lungs/Pleura: Patchy consolidative and ground-glass opacities in all lobes of the lung. Slight interlobular septal thickening. Small bilateral pleural effusions. No pneumothorax. Upper Abdomen: No acute abnormality. Musculoskeletal: No fracture is seen. IMPRESSION: 1. Patchy consolidative and ground-glass opacities in all lobes of the lung, most consistent with multifocal pneumonia. 2. Small bilateral pleural effusions and slight interlobular septal thickening, which may represent mild superimposed interstitial edema. 3. Aortic Atherosclerosis (ICD10-I70.0) and coronary artery atherosclerosis. 4. Mild cardiomegaly. Electronically Signed   By: Margaretha Sheffield M.D.   On: 06/15/2022 11:43    DG Chest Port 1 View  Result Date: 06/15/2022 CLINICAL DATA:  Worsening shortness of breath EXAM: PORTABLE CHEST 1 VIEW COMPARISON:  06/15/2022 FINDINGS: Chronic cardiomegaly. Interstitial coarsening with [employer], similar to prior and new from 2022. Indistinct airspace type density at the bases. No visible effusion or pneumothorax. Artifact from EKG leads. IMPRESSION: CHF pattern.  Cannot exclude superimposed pneumonia. Electronically Signed   By: Jorje Guild M.D.   On: 06/15/2022 06:22   DG Chest Portable 1 View  Result Date: 06/15/2022 CLINICAL DATA:  Shortness of breath onset 3 hours ago. Hypoxia with saturation 85%. EXAM: PORTABLE CHEST 1 VIEW COMPARISON:  Portable chest 02/09/2021 FINDINGS: The heart has enlarged compared to the prior study with development of vascular engorgement and flow cephalization and moderate diffuse interstitial edema with a basal gradient. There are patchy hazy opacities in the mid to lower lung fields which probably due to alveolar edema, less likely multifocal pneumonia, with scattered atelectatic bands in the lower zones. Small pleural effusions are beginning to form. There is aortic  atherosclerosis with stable mediastinal configuration. There is thoracic spondylosis. IMPRESSION: Moderate CHF findings with vascular engorgement and flow cephalization and moderate interstitial edema. Additional opacities in the mid to lower lung fields most likely due to alveolar edema, less likely multifocal pneumonia, with small pleural effusions. Progress chest films recommended depending on clinical response. Electronically Signed   By: Telford Nab M.D.   On: 06/15/2022 02:03    Assessment and Plan [name] is a 88 y.o. female with medical history significant of CAD, PAF not on anticoagulation, HTN, HLD, IDDM, PVD, COPD Gold stage I, CKD stage IIIa, chronic normocytic anemia, dementia, presented with cough shortness of breath.   Patient started to develop dry  cough last 1 to 2 days.  No fever chills no chest pain.  Patient's son noticed patient was coughing yesterday afterno Patient was started on Dupixent every 2 weeks injection for her dermatitis about 2 months ago but denied any rash at this point.  Acute HFpEF decompensation in the setting of Multifocal Pneumonia -She has pulmonary edema secondary to CHF decompensation likely from uncontrolled HTN.   -Agreed with Lasix increased dose to 40 mg twice daily, monitor kidney function--hold more lasix since creat up--UOP >1500 cc. Pt overall feeling better -Discontinue Cardizem given acute CHF -Started Coreg twice daily -As needed hydralazine -Continue losartan and Imdur -Echocardiogram -  Cont IV ceftriaxone and azithromycin    Acute on chronic COPD exacerbation -Short course steroid, continue LABA, inhaled steroid, DuoNebs -Incentive spirometry -Wean down BiPAP --wean oxygen to room air if able to keep sats greater than 92%. Patient does not use oxygen at home   SIRS -Secondary to CHF decompensation and COPD exacerbation, doubt sepsis (ruled out) -- improved   Leukocytosis -suspected due to pneumonia -Denies any diarrhea, no significant skin infection   IDDM, with hyperglycemia -Resume home dose of NovoLog 10 units 3 times daily AC, add sliding scale. -- Resume long-acting insulin. -- Patient follows with Dr. Gabriel Carina at [facility]   HTN -As above.   CKD stage IIIa -Creatinine level stable, on Lasix, monitor kidney function -Fluid overload, continue Lasix   Atopic dermatitis -Continue tacrolimus topical -Continue Dupixent   DVT prophylaxis: Lovenox Code Status: DNR Family Communication: Son over the phone Consults : none Level of care: Progressive Status is: Inpatient Remains inpatient appropriate because: IV antibiotics, IV diuresis anticipate discharge in 1 to 2 days. Try wean oxygen to room air    TOTAL TIME TAKING CARE OF THIS PATIENT: 35 minutes.  >50% time  spent on counselling and coordination of care  Note: This dictation was prepared with Dragon dictation along with smaller phrase technology. Any transcriptional errors that result from this process are unintentional.  Fritzi Mandes M.D    [facility]   CC: Primary care physician; Jerrol Banana., MD

## 2022-06-16 NOTE — Consult Note (Signed)
   Heart Failure Nurse Navigator Note  HFmrEF 45-50%.  Mild LVH.  Right ventricular systolic function is mildly reduced.  Mild left atrial enlargement.  Mild mitral regurgitation.  She presented to the emergency room with complaints of a cough and shortness of breath.  X-ray revealed pulmonary congestion.  Comorbidities:  Coronary artery disease Paroxysmal atrial fibrillation not on anticoagulation Hypertension Hyperlipidemia Insulin-dependent diabetes Peripheral vascular disease COPD Chronic kidney disease stage III Normocytic anemia iron Dementia  Medications:  Aspirin 325 mg daily Atorvastatin 10 mg daily Carvedilol 6.25 mg 2 times a day with meals Plavix 75 mg daily Zetia 10 mg daily Furosemide 40 mg IV 2 times a day Isosorbide mononitrate 30 mg daily Losartan 100 mg daily  Labs:  Sodium 139, potassium 3.6, chloride 101, CO2 26, BUN 31, creatinine 1.35, estimated GFR 37 Weight is 63 kg Intake 497 mL Output 1100 mL Blood pressure 101/54   Initial meeting with patient, she was awake and alert sitting up in the chair at bedside.  She was in no acute distress.  She states that she lives at home by herself, her children supply her with her meals daily along with assisting with house cleaning and chores.  She states that she spends her days watching TV and also enjoys being outside on her porch, talking with neighbors.  She states due to macular degeneration she is unable to read a scale so she does not weigh herself daily.  Discussed getting her one from the heart failure clinic but at this time they do not have any talking scales.  Discussed her diet, she states that she has never been 1 to add salt to her food and does not even add pepper.  She felt that her children prepared her food with lower sodium in mind.  Discussed her fluid intake, she states she is one that she likes her ice water.  Discussed the importance of sticking with a fluid restriction of 64 ounces  in a days time.  All that that included.  Discussed follow-up in the outpatient heart failure clinic but she states that she has her cardiologist Dr. Nehemiah Massed.  Explained that the heart failure clinic appointment does not take the place of Dr. Alveria Apley appointments.  She will discuss this with her family.  She was given the living with heart failure teaching booklet, zone magnet, info on heart failure and low sodium.  She had no further questions at this time.  Pricilla Riffle RN [facility]

## 2022-06-16 NOTE — Discharge Instructions (Signed)

## 2022-06-16 NOTE — Inpatient Diabetes Management (Addendum)
Inpatient Diabetes Program Recommendations  AACE/ADA: New Consensus Statement on Inpatient Glycemic Control (2015)  Target Ranges:  Prepandial:   less than 140 mg/dL      Peak postprandial:   less than 180 mg/dL (1-2 hours)      Critically ill patients:  140 - 180 mg/dL   Lab Results  Component Value Date   GLUCAP 111 (H) 06/16/2022   HGBA1C 6.9 (H) 06/15/2022    Review of Glycemic Control  Latest Reference Range & Units 12/29/21 08:26 12/29/21 12:37 01/27/22 09:22 06/15/22 08:43 06/15/22 11:57 06/15/22 16:52 06/15/22 21:19 06/16/22 05:02 06/16/22 07:32  Glucose-Capillary 70 - 99 mg/dL 220 (H) 166 (H) 168 (H) 344 (H) 380 (H) 226 (H) 161 (H) 116 (H) 111 (H)   Diabetes history: DM2 Outpatient Diabetes medications: Lantus 5 units QHS, Humalog 10 units with breakfast, 8 units with lunch, 18 units with supper; Dexcom CGM Current orders for Inpatient glycemic control: Novolog 10 units TID with meals, Novolog 0-9 units TID with meals  Inpatient Diabetes Program Recommendations:   Spoke with Dr. Serita Grit regarding insulin regimen. Please consider: -Semglee 6-8 units q 24 hrs.  Spoke with patient regarding diabetes management @ home. Patient states she does not have any hypoglycemia @ home and able to verbalize without hesitation proper treatment for hypoglycemia.  Received one dose Solumedrol 125 mg on 06/15/22  Thank you, Nani Gasser. Laportia Carley, RN, MSN, CDE  Diabetes Coordinator Inpatient Glycemic Control Team Team Pager [phone] (8am-5pm) 06/16/2022 9:59 AM

## 2022-06-16 NOTE — TOC Initial Note (Addendum)
Transition of Care [facility]) - Initial/Assessment Note    Patient Details  Name: [name] MRN: [id number] Date of Birth: [birth year]  Transition of Care [facility]) CM/SW Contact:    Alberteen Sam, LCSW Phone Number: 06/16/2022, 11:07 AM  Clinical Narrative:                  CSW spoke with patient at bedside regarding discharge planning. Patient confirms her PCP is Dr. Rosanna Randy, she goes to [place] and has a walker at home with no dme needs. At baseline patient reports not being on home O2, patient is currently on 2L.   Patient reports she feels comfortable going home at discharge with home health services, has hx of being active with [facility], agreeable to continue with them for [facility] PT and RN.   Referral made to [facility] with [place], accepted.   TOC will continue to follow for discharge planning needs.   Expected Discharge Plan: [place] Barriers to Discharge: Continued Medical Work up   Patient Goals and CMS Choice Patient states their goals for this hospitalization and ongoing recovery are:: to go home CMS Medicare.gov Compare Post Acute Care list provided to:: Patient Choice offered to / list presented to : Patient  Expected Discharge Plan and Services Expected Discharge Plan: [place]       Living arrangements for the past 2 months: Single Family Home                           HH Arranged: PT, RN [facility] Agency: [place] Date [facility] Agency Contacted: 06/16/22 Time HH Agency Contacted: 1107 Representative spoke with at [place]: Tommi Rumps  Prior Living Arrangements/Services Living arrangements for the past 2 months: [place] Lives with:: Self   Do you feel safe going back to the place where you live?: Yes               Activities of Daily Living Home Assistive Devices/Equipment: Environmental consultant (specify type) ADL Screening (condition at time of admission) Patient's cognitive ability adequate to safely  complete daily activities?: Yes Is the patient deaf or have difficulty hearing?: No Does the patient have difficulty seeing, even when wearing glasses/contacts?: No Does the patient have difficulty concentrating, remembering, or making decisions?: No Patient able to express need for assistance with ADLs?: Yes Does the patient have difficulty dressing or bathing?: No Independently performs ADLs?: Yes (appropriate for developmental age) Does the patient have difficulty walking or climbing stairs?: Yes Weakness of Legs: Both Weakness of Arms/Hands: None  Permission Sought/Granted                  Emotional Assessment       Orientation: : Oriented to Self, Oriented to Place, Oriented to  Time, Oriented to Situation Alcohol / Substance Use: Not Applicable Psych Involvement: No (comment)  Admission diagnosis:  CHF (congestive heart failure) ([place]) [I50.9] Hypoxia [R09.02] COPD exacerbation ([place]) [J44.1] Community acquired pneumonia, unspecified laterality [J18.9] Acute congestive heart failure, unspecified heart failure type ([place]) [I50.9] Sepsis, due to unspecified organism, unspecified whether acute organ dysfunction present [facility]) [A41.9] Patient Active Problem List   Diagnosis Date Noted   CHF (congestive heart failure) ([place]) 06/15/2022   Pain of left hip joint 03/08/2022   History of falling 07/14/2021   Neurocognitive disorder with Lewy bodies (CODE) ([place]) 07/07/2021   Atherosclerosis of native arteries of the extremities with  ulceration ([place]) 04/07/2021   PSVT (paroxysmal supraventricular tachycardia) ([place]) 91/91/6606   Acute metabolic encephalopathy 00/45/9977   Frequent falls 02/10/2021   COVID-19 virus infection 02/10/2021   Chronic kidney disease, stage 3a ([place]) 02/10/2021   Dementia ([place]) 02/10/2021   Garbled speech, intermittent 02/10/2021   Laceration without foreign body, left ankle, subsequent encounter 02/10/2021   AMS (altered mental status) 02/10/2021    Osteoarthritis of left shoulder 10/27/2020   Type 2 diabetes mellitus with diabetic peripheral angiopathy without gangrene ([place]) 09/27/2020   Personal history of malignant melanoma of skin 09/27/2020   Long term (current) use of insulin ([place]) 09/27/2020   Coronary artery disease involving native coronary artery of native heart 04/17/2020   Celiac artery stenosis ([place]) 06/12/2019   Bilateral lower abdominal cramping 04/20/2019   UTI symptoms 04/20/2019   History of rectal bleeding 03/26/2019   DM type 2 with diabetic peripheral neuropathy ([place]) 12/14/2018   Arthropathy of lumbar facet joint 09/04/2018   Degeneration of lumbar intervertebral disc 09/04/2018   Spondylolisthesis, grade 1 09/04/2018   Lewy body dementia ([place]) 02/17/2018   PVD (peripheral vascular disease) ([place]) 08/16/2017   Pain in limb 07/27/2016   Hallucinations 07/07/2016   Chest pain 07/01/2016   Depression 06/23/2016   Hypokalemia 09/03/2015   Insomnia 07/23/2015   Headache 07/23/2015   Type 2 diabetes mellitus with vascular disease ([place]) 06/24/2015   Chronic vulvitis 06/02/2015   Allergic rhinitis 05/30/2015   Anemia 05/30/2015   Back ache 05/30/2015   Body mass index (BMI) of 29.0-29.9 in adult 05/30/2015   Edema 05/30/2015   Dilatation of esophagus 05/30/2015   Fall 05/30/2015   H/O deep venous thrombosis 05/30/2015   Hypercholesteremia 05/30/2015   Heart & renal disease, hypertensive, with heart failure ([place]) 05/30/2015   Hyponatremia 05/30/2015   Calculus of kidney 05/30/2015   Gonalgia 05/30/2015   Psoriasis 05/30/2015   Avitaminosis D 05/30/2015   Cough 04/01/2015   Combined fat and carbohydrate induced hyperlipemia 12/30/2014   Paroxysmal atrial fibrillation ([place]) 07/10/2014   Abnormal gait 07/08/2014   Anxiety state 07/08/2014   Chronic kidney disease 07/08/2014   Acid reflux 07/08/2014   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[place]) 07/08/2014   Chronic obstructive pulmonary disease ([place]) 07/08/2014    Type II diabetes mellitus with renal manifestations ([place]) 07/08/2014   Benign essential HTN 06/26/2014   Carotid artery narrowing 06/26/2014   Myalgia 06/26/2014   Basal cell carcinoma of face 05/01/2014   Disease of female genital organs 11/28/2012   Incomplete bladder emptying 09/05/2012   Excessive urination at night 08/15/2012   Basal cell carcinoma of ear 10/26/2011   PCP:  Jerrol Banana., MD Pharmacy:   [place], [place] - [address] [place] [address] Phone: [phone] [facility]ax: [phone] - [place], [address] [place] [address] Phone: [phone] Fax: [phone]     Social Determinants of Health (SDOH) Interventions    Readmission Risk Interventions     No data to display

## 2022-06-16 NOTE — Evaluation (Signed)
Physical Therapy Evaluation Patient Details Name: [name] MRN: [id number] DOB: 03/15/1929 Today's Date: 06/16/2022  History of Present Illness  88 y.o. female with PMHx includes Afib, cervicalgia, COPD, DDD, DM, HLD, HTN, and dysphagia who presents to the ED from home with a chief complaint of shortness of breath.  Patient with a history of COPD not on home oxygen.  Endorses dry cough and progressive shortness of breath which worsened 3 hours ago.  Endorses associated chest tightness.  Clinical Impression  Pt received in chair agreeable to participate in PT evaluation. Pt is is pleasant, cooperative and motivated. A & O x 4. Pt Ind at household level except cooking and takes shower at her daughter's home. PT assessment revealed decreased aerobic capacity and generalized weakness. Pt performed all functional mobility with sup to CGA of 1 using RW. Nurses made aware to help pt to bathroom to improve endurance outside of PT session. Mobility tech will work with pt while in acute care. Pt Ambulated in the hallway without O2 via [place] while maintaining SPO2>90% through out the session. Pt left without O2 and without any discomfort. Pt will benefit and agrees with HHPT after D/C.      Recommendations for follow up therapy are one component of a multi-disciplinary discharge planning process, led by the attending physician.  Recommendations may be updated based on patient status, additional functional criteria and insurance authorization.  Follow Up Recommendations Home health PT      Assistance Recommended at Discharge Intermittent Supervision/Assistance  Patient can return home with the following  A little help with walking and/or transfers;A little help with bathing/dressing/bathroom;Assistance with cooking/housework;Direct supervision/assist for medications management;Direct supervision/assist for financial management;Assist for transportation;Help with stairs or ramp for entrance     Equipment Recommendations None recommended by PT  Recommendations for Other Services       Functional Status Assessment Patient has had a recent decline in their functional status and demonstrates the ability to make significant improvements in function in a reasonable and predictable amount of time.     Precautions / Restrictions Precautions Precautions: Fall Restrictions Weight Bearing Restrictions: No      Mobility  Bed Mobility Overal bed mobility: Modified Independent             General bed mobility comments: received in chair    Transfers Overall transfer level: Needs assistance Equipment used: Rolling walker (2 wheels) Transfers: Sit to/from Stand Sit to Stand: Supervision                Ambulation/Gait Ambulation/Gait assistance: Min guard Gait Distance (Feet): 160 Feet Assistive device: Rolling walker (2 wheels) Gait Pattern/deviations: Step-to pattern Gait velocity: dec     General Gait Details: WNL/age appropriate  Stairs            Wheelchair Mobility    Modified Rankin (Stroke Patients Only)       Balance Overall balance assessment: Needs assistance Sitting-balance support:  (in cahir) Sitting balance-Leahy Scale: Good     Standing balance support: Bilateral upper extremity supported, During functional activity Standing balance-Leahy Scale: Good   Single Leg Stance - Right Leg: 2 Single Leg Stance - Left Leg: 4 Tandem Stance - Right Leg: 10 Tandem Stance - Left Leg: 20 [place] - Eyes Opened: 30 [place] - Eyes Closed: 20                 Pertinent Vitals/Pain Pain Assessment Pain Assessment: No/denies pain    Home Living Family/patient expects to be  discharged to:: Private residence Living Arrangements: Alone Available Help at Discharge: Family;Available PRN/intermittently Type of Home: House Home Access: Stairs to enter Entrance Stairs-Rails: None Entrance Stairs-Number of Steps: 2   Home Layout: One  level Home Equipment: Conservation officer, nature (2 wheels);Shower seat Additional Comments: pt showers at daughter's home. Her home she takes bird baths.    Prior Function Prior Level of Function : Needs assist             Mobility Comments: ambulates with a 2WW ADLs Comments: Indep with dressing, toileting, and grooming; goes to her daughter's house to shower (walk in shower, shower chair, dtr helps with transfers and washing her back), and family also assists with transportation and meals. Pharmacy provides weekly pill box prefilled for pt's meds. Denies falls in past 49mo     Hand Dominance        Extremity/Trunk Assessment   Upper Extremity Assessment Upper Extremity Assessment: Defer to OT evaluation    Lower Extremity Assessment Lower Extremity Assessment: Generalized weakness       Communication   Communication: HOH  Cognition Arousal/Alertness: Awake/alert Behavior During Therapy: WFL for tasks assessed/performed Overall Cognitive Status: Within Functional Limits for tasks assessed                                          General Comments      Exercises General Exercises - Lower Extremity Ankle Circles/Pumps: AROM, 10 reps, Seated Gluteal Sets: AROM, 10 reps, Seated Long Arc Quad: AROM, 10 reps, Seated Other Exercises Other Exercises: Pt/family educated in PLB and ECS, glueet bridges 10 reps seated.   Assessment/Plan    PT Assessment Patient needs continued PT services  PT Problem List Decreased strength;Decreased balance;Decreased mobility;Cardiopulmonary status limiting activity       PT Treatment Interventions Gait training;Stair training;Functional mobility training;Therapeutic activities;Therapeutic exercise;Balance training;Neuromuscular re-education;Patient/family education    PT Goals (Current goals can be found in the Care Plan section)  Acute Rehab PT Goals Patient Stated Goal: I want to get strnger and go home." PT Goal  Formulation: With patient Time For Goal Achievement: 06/30/22 Potential to Achieve Goals: Good    Frequency Min 2X/week     Co-evaluation               AM-PAC PT "6 Clicks" Mobility  Outcome Measure Help needed turning from your back to your side while in a flat bed without using bedrails?: None Help needed moving from lying on your back to sitting on the side of a flat bed without using bedrails?: None Help needed moving to and from a bed to a chair (including a wheelchair)?: A Little Help needed standing up from a chair using your arms (e.g., wheelchair or bedside chair)?: A Little Help needed to walk in hospital room?: A Little Help needed climbing 3-5 steps with a railing? : A Lot 6 Click Score: 19    End of Session Equipment Utilized During Treatment: Gait belt Activity Tolerance: Patient tolerated treatment well (Pt tired after gait.) Patient left: in chair;with call bell/phone within reach;with chair alarm set Nurse Communication: Mobility status PT Visit Diagnosis: Difficulty in walking, not elsewhere classified (R26.2);Muscle weakness (generalized) (M62.81)    Time: [address]38PT Time Calculation (min) (ACUTE ONLY): 24 min   Charges:   PT Evaluation $PT Eval Low Complexity: 1 Low PT Treatments $Gait Training: 8-22 mins $Therapeutic Exercise: 8-22 mins  Joaquin Music PT DPT 3:13 PM,06/16/22

## 2022-06-16 NOTE — Progress Notes (Signed)
Nutrition Brief Note  RD pulled to chart secondary to CHF.   Wt Readings from Last 15 Encounters:  06/16/22 63 kg  05/20/22 60.3 kg  04/12/22 60.7 kg  01/26/22 59 kg  01/06/22 60.4 kg  07/29/21 59.4 kg  06/25/21 60.3 kg  05/07/21 64.6 kg  04/07/21 63.6 kg  02/09/21 66 kg  02/08/21 66 kg  01/27/21 66.3 kg  01/26/21 66.7 kg  11/18/20 67.1 kg  10/16/20 67.6 kg   Pt with medical history significant of CAD, PAF not on anticoagulation, HTN, HLD, IDDM, PVD, COPD Gold stage I, CKD stage IIIa, chronic normocytic anemia, dementia, presented with cough shortness of breath.  RD provided "Heart Healthy, Consistent Carbohydrate Nutrition Therapy" handout from [facility] Nutrition Care Manual; attached to AVS/ discharge instructions.   Current diet order is heart healthy/ carb modified, patient is consuming approximately n/a% of meals at this time. Labs and medications reviewed.   No nutrition interventions warranted at this time. If nutrition issues arise, please consult RD.   Loistine Chance, RD, LDN, [place] Registered Dietitian II Certified Diabetes Care and Education Specialist Please refer to [facility] for RD and/or RD on-call/weekend/after hours pager

## 2022-06-17 DIAGNOSIS — J441 Chronic obstructive pulmonary disease with (acute) exacerbation: Secondary | ICD-10-CM | POA: Diagnosis not present

## 2022-06-17 DIAGNOSIS — I5031 Acute diastolic (congestive) heart failure: Secondary | ICD-10-CM | POA: Diagnosis not present

## 2022-06-17 DIAGNOSIS — J189 Pneumonia, unspecified organism: Secondary | ICD-10-CM | POA: Diagnosis not present

## 2022-06-17 LAB — BASIC METABOLIC PANEL
Anion gap: 6 (ref 5–15)
BUN: 38 mg/dL — ABNORMAL HIGH (ref 8–23)
CO2: 29 mmol/L (ref 22–32)
Calcium: 7.9 mg/dL — ABNORMAL LOW (ref 8.9–10.3)
Chloride: 101 mmol/L (ref 98–111)
Creatinine, Ser: 1.16 mg/dL — ABNORMAL HIGH (ref 0.44–1.00)
GFR, Estimated: 45 mL/min — ABNORMAL LOW (ref >60)
Glucose, Bld: 115 mg/dL — ABNORMAL HIGH (ref 70–99)
Potassium: 3.8 mmol/L (ref 3.5–5.1)
Sodium: 136 mmol/L (ref 135–145)

## 2022-06-17 LAB — CBC
HCT: 25.5 % — ABNORMAL LOW (ref 36.0–46.0)
Hemoglobin: 7.9 g/dL — ABNORMAL LOW (ref 12.0–15.0)
MCH: 22.6 pg — ABNORMAL LOW (ref 26.0–34.0)
MCHC: 31 g/dL (ref 30.0–36.0)
MCV: 72.9 fL — ABNORMAL LOW (ref 80.0–100.0)
Platelets: 239 10*3/uL (ref 150–400)
RBC: 3.5 MIL/uL — ABNORMAL LOW (ref 3.87–5.11)
RDW: 17.7 % — ABNORMAL HIGH (ref 11.5–15.5)
WBC: 10.1 10*3/uL (ref 4.0–10.5)
nRBC: 0 % (ref 0.0–0.2)

## 2022-06-17 LAB — PROCALCITONIN: Procalcitonin: 0.97 ng/mL

## 2022-06-17 LAB — VITAMIN B12: Vitamin B-12: 249 pg/mL (ref 180–914)

## 2022-06-17 LAB — IRON AND TIBC
Iron: 15 ug/dL — ABNORMAL LOW (ref 28–170)
Saturation Ratios: 4 % — ABNORMAL LOW (ref 10.4–31.8)
TIBC: 351 ug/dL (ref 250–450)
UIBC: 336 ug/dL

## 2022-06-17 LAB — GLUCOSE, CAPILLARY
Glucose-Capillary: 117 mg/dL — ABNORMAL HIGH (ref 70–99)
Glucose-Capillary: 114 mg/dL — ABNORMAL HIGH (ref 70–99)
Glucose-Capillary: 112 mg/dL — ABNORMAL HIGH (ref 70–99)
Glucose-Capillary: 159 mg/dL — ABNORMAL HIGH (ref 70–99)

## 2022-06-17 MED ORDER — SODIUM CHLORIDE 0.9 % IV SOLN
200.0000 mg | INTRAVENOUS | Status: DC
  Administered 2022-06-17: 200 mg via INTRAVENOUS
  Filled 2022-06-17: qty 200

## 2022-06-17 MED ORDER — CEFUROXIME AXETIL 500 MG PO TABS
250.0000 mg | ORAL_TABLET | Freq: Two times a day (BID) | ORAL | Status: DC
  Administered 2022-06-18: 250 mg via ORAL
  Filled 2022-06-17: qty 0.5
  Filled 2022-06-17: qty 1

## 2022-06-17 MED ORDER — FUROSEMIDE 10 MG/ML IJ SOLN
20.0000 mg | Freq: Once | INTRAMUSCULAR | Status: AC
  Administered 2022-06-17: 20 mg via INTRAVENOUS
  Filled 2022-06-17: qty 2

## 2022-06-17 NOTE — Care Management Important Message (Signed)
Important Message  Patient Details  Name: [name] MRN: [id number] Date of Birth: Sep 06, 1929   Medicare Important Message Given:  N/A - LOS <3 / Initial given by admissions     [name] 06/17/2022, 2:35 PM

## 2022-06-17 NOTE — Progress Notes (Signed)
Occupational Therapy Treatment Patient Details Name: [name] MRN: [id number] DOB: June 23, 1929 Today's Date: 06/17/2022   History of present illness 88 y.o. female with PMHx includes Afib, cervicalgia, COPD, DDD, DM, HLD, HTN, and dysphagia who presents to the ED from home with a chief complaint of shortness of breath.  Patient with a history of COPD not on home oxygen.  Endorses dry cough and progressive shortness of breath which worsened 3 hours ago.  Endorses associated chest tightness.   OT comments  [name] was seen for OT treatment on this date. Upon arrival to room pt awake/alert, seated in room recliner, and eager to participate in OT tx session. Pt endorses need to use the commode and able to perform toilet transfer, toileting, and standing UB grooming tasks with SUPERVISION for safety and use of 2WW as described below (see ADL section for additional detail). Pt benefits from reinforcement of prior energy conservation strategies addressed at previous therapy sessions. She return demos understanding of ECS including PLB and activity pacing during session with min cueing from this author. VSS t/o session with pt on 1L [place]. Pt making good progress toward goals and continues to benefit from skilled OT services to maximize return to PLOF and minimize risk of future falls, injury, caregiver burden, and readmission. Will continue to follow POC. Discharge recommendation remains appropriate.     Recommendations for follow up therapy are one component of a multi-disciplinary discharge planning process, led by the attending physician.  Recommendations may be updated based on patient status, additional functional criteria and insurance authorization.    Follow Up Recommendations  No OT follow up    Assistance Recommended at Discharge Intermittent Supervision/Assistance  Patient can return home with the following  Assistance with cooking/housework;Assist for transportation;Help with  stairs or ramp for entrance;A little help with bathing/dressing/bathroom   Equipment Recommendations  None recommended by OT    Recommendations for Other Services      Precautions / Restrictions Precautions Precautions: Fall Restrictions Weight Bearing Restrictions: No       Mobility Bed Mobility Overal bed mobility: Modified Independent             General bed mobility comments: received in chair    Transfers Overall transfer level: Needs assistance Equipment used: Rolling walker (2 wheels) Transfers: Sit to/from Stand Sit to Stand: Supervision                 Balance Overall balance assessment: Needs assistance   Sitting balance-Leahy Scale: Good     Standing balance support: No upper extremity supported, During functional activity Standing balance-Leahy Scale: Good                             ADL either performed or assessed with clinical judgement   ADL                                         General ADL Comments: Pt performs toilet transfer, toileting, and standing grooming (hand washing and oral care) with supervision and use of RW this date. Education on energy conservation strategies and falls prevention provided t/o session.    Extremity/Trunk Assessment Upper Extremity Assessment Upper Extremity Assessment: Overall WFL for tasks assessed   Lower Extremity Assessment Lower Extremity Assessment: Generalized weakness        Vision Baseline Vision/History: 6 Macular  Degeneration Ability to See in Adequate Light: 2 Moderately impaired Patient Visual Report: No change from baseline     Perception     Praxis      Cognition Arousal/Alertness: Awake/alert Behavior During Therapy: WFL for tasks assessed/performed Overall Cognitive Status: Within Functional Limits for tasks assessed                                          Exercises Other Exercises Other Exercises: Pt provided with  reinforcement of past education on energy conservation techniques during functional activity as described above. See ADL section for additional detail.    Shoulder Instructions       General Comments on 1L O2 t/o session. SpO2 98-99% with functional activity.    Pertinent Vitals/ Pain       Pain Assessment Pain Assessment: No/denies pain  Home Living                                          Prior Functioning/Environment              Frequency  Min 2X/week        Progress Toward Goals  OT Goals(current goals can now be found in the care plan section)  Progress towards OT goals: Progressing toward goals  Acute Rehab OT Goals Patient Stated Goal: breathe better and go home OT Goal Formulation: With patient/family Time For Goal Achievement: 06/29/22 Potential to Achieve Goals: Good  Plan Discharge plan remains appropriate;Frequency remains appropriate    Co-evaluation                 AM-PAC OT "6 Clicks" Daily Activity     Outcome Measure   Help from another person eating meals?: None Help from another person taking care of personal grooming?: None Help from another person toileting, which includes using toliet, bedpan, or urinal?: A Little Help from another person bathing (including washing, rinsing, drying)?: A Little Help from another person to put on and taking off regular upper body clothing?: None Help from another person to put on and taking off regular lower body clothing?: A Little 6 Click Score: 21    End of Session Equipment Utilized During Treatment: Gait belt;Oxygen;Rolling walker (2 wheels)  OT Visit Diagnosis: Other abnormalities of gait and mobility (R26.89)   Activity Tolerance Patient tolerated treatment well   Patient Left in chair;Other (comment) (With PT in room to begin session.)   Nurse Communication Mobility status        Time: 5397-6734 OT Time Calculation (min): 18 min  Charges: OT General  Charges $OT Visit: 1 Visit OT Treatments $Self Care/Home Management : 8-22 mins  Shara Blazing, M.S., OTR/L Ascom: [phone] 06/17/22, 3:10 PM

## 2022-06-17 NOTE — Progress Notes (Signed)
[facility] NAME: [name]    MR#:  [id number]  DATE OF BIRTH:  [birth year]  SUBJECTIVE:  patient seen earlier. Told me she slept very good. Shortness of breath much improved. Overall feeling well. No fever. No family at bedside.    VITALS:  Blood pressure 136/67, pulse 89, temperature 98 F (36.7 C), temperature source Oral, resp. rate 18, height '5\' 2"'$  (1.575 m), weight 63.8 kg, SpO2 97 %.  PHYSICAL EXAMINATION:   GENERAL:  88 y.o.-year-old patient lying in the bed with no acute distress.  LUNGS: Normal breath sounds bilaterally, few crackles CARDIOVASCULAR: S1, S2 normal. No murmurs, rubs, or gallops.  ABDOMEN: Soft, nontender, nondistended. Bowel sounds present.  EXTREMITIES: No  edema b/l.    NEUROLOGIC: nonfocal  patient is alert and awake Skin: no active rash LABORATORY PANEL:  CBC Recent Labs  Lab 06/17/22 0720  WBC 10.1  HGB 7.9*  HCT 25.5*  PLT 239     Chemistries  Recent Labs  Lab 06/15/22 0154 06/16/22 0432 06/17/22 0720  NA 140   < > 136  K 3.8   < > 3.8  CL 103   < > 101  CO2 23   < > 29  GLUCOSE 272*   < > 115*  BUN 21   < > 38*  CREATININE 1.07*   < > 1.16*  CALCIUM 8.5*   < > 7.9*  AST 20  --   --   ALT 12  --   --   ALKPHOS 95  --   --   BILITOT 0.9  --   --    < > = values in this interval not displayed.    Cardiac Enzymes No results for input(s): "TROPONINI" in the last 168 hours. RADIOLOGY:  ECHOCARDIOGRAM COMPLETE  Result Date: 06/16/2022    ECHOCARDIOGRAM REPORT   Patient Name:   [name] Date of Exam: 06/15/2022 Medical Rec #:  [id number]            Height:       62.0 in Accession #:    [id number]           Weight:       136.7 lb Date of Birth:  06-05-1929            BSA:          1.626 m Patient Age:    66 years             BP:           148/77 mmHg Patient Gender: F                    HR:           84 bpm. Exam Location:  [facility] Procedure: 2D Echo, Cardiac  Doppler and Color Doppler Indications:     B70.48 Acute Diastolic CHF  History:         Patient has no prior history of Echocardiogram examinations.                  COPD, Arrythmias:Atrial Fibrillation; Risk                  Factors:Hypertension, Dyslipidemia and Diabetes.  Sonographer:     Cresenciano Lick RDCS Referring Phys:  [id number] Lequita Halt Diagnosing Phys: Nelva Bush MD IMPRESSIONS  1. Left ventricular ejection fraction, by estimation, is 45 to 50%.  The left ventricle has mildly decreased function. The left ventricle demonstrates global hypokinesis. There is mild left ventricular hypertrophy. Left ventricular diastolic parameters are indeterminate.  2. Right ventricular systolic function is mildly reduced. The right ventricular size is normal. There is normal pulmonary artery systolic pressure.  3. Left atrial size was mildly dilated.  4. The mitral valve is degenerative. Mild mitral valve regurgitation.  5. The aortic valve is tricuspid. There is mild calcification of the aortic valve. There is mild thickening of the aortic valve. Aortic valve regurgitation is not visualized. Aortic valve sclerosis is present, with no evidence of aortic valve stenosis.  6. The inferior vena cava is normal in size with <50% respiratory variability, suggesting right atrial pressure of 8 mmHg. FINDINGS  Left Ventricle: Left ventricular ejection fraction, by estimation, is 45 to 50%. The left ventricle has mildly decreased function. The left ventricle demonstrates global hypokinesis. The left ventricular internal cavity size was normal in size. There is  mild left ventricular hypertrophy. Left ventricular diastolic function could not be evaluated due to atrial fibrillation. Left ventricular diastolic parameters are indeterminate. Right Ventricle: The right ventricular size is normal. No increase in right ventricular wall thickness. Right ventricular systolic function is mildly reduced. There is normal pulmonary  artery systolic pressure. The tricuspid regurgitant velocity is 2.18 m/s, and with an assumed right atrial pressure of 8 mmHg, the estimated right ventricular systolic pressure is 61.6 mmHg. Left Atrium: Left atrial size was mildly dilated. Right Atrium: Right atrial size was normal in size. Pericardium: There is no evidence of pericardial effusion. Mitral Valve: The mitral valve is degenerative in appearance. There is mild thickening of the mitral valve leaflet(s). Mild mitral valve regurgitation. Tricuspid Valve: The tricuspid valve is normal in structure. Tricuspid valve regurgitation is trivial. Aortic Valve: The aortic valve is tricuspid. There is mild calcification of the aortic valve. There is mild thickening of the aortic valve. Aortic valve regurgitation is not visualized. Aortic valve sclerosis is present, with no evidence of aortic valve stenosis. Pulmonic Valve: The pulmonic valve was not well visualized. Pulmonic valve regurgitation is not visualized. No evidence of pulmonic stenosis. Aorta: The aortic root and ascending aorta are structurally normal, with no evidence of dilitation. Venous: The inferior vena cava is normal in size with less than 50% respiratory variability, suggesting right atrial pressure of 8 mmHg. IAS/Shunts: No atrial level shunt detected by color flow Doppler.  LEFT VENTRICLE PLAX 2D LVIDd:         4.30 cm LVIDs:         3.20 cm LV PW:         1.30 cm LV IVS:        1.20 cm LVOT diam:     2.00 cm LV SV:         36 LV SV Index:   22 LVOT Area:     3.14 cm  RIGHT VENTRICLE             IVC RV Basal diam:  3.50 cm     IVC diam: 1.50 cm RV S prime:     11.80 cm/s TAPSE (M-mode): 1.6 cm LEFT ATRIUM             Index        RIGHT ATRIUM           Index LA diam:        4.00 cm 2.46 cm/m   RA Area:     13.90 cm LA  Vol (A2C):   81.8 ml 50.30 ml/m  RA Volume:   33.60 ml  20.66 ml/m LA Vol (A4C):   42.3 ml 26.01 ml/m LA Biplane Vol: 60.4 ml 37.14 ml/m  AORTIC VALVE LVOT Vmax:   69.58  cm/s LVOT Vmean:  49.240 cm/s LVOT VTI:    0.115 m  AORTA Ao Root diam: 3.30 cm Ao Asc diam:  3.50 cm MV E velocity: 149.00 cm/s  TRICUSPID VALVE                             TR Peak grad:   19.0 mmHg                             TR Vmax:        218.00 cm/s                              SHUNTS                             Systemic VTI:  0.12 m                             Systemic Diam: 2.00 cm Nelva Bush MD Electronically signed by Nelva Bush MD Signature Date/Time: 06/16/2022/7:04:40 AM    Final     Assessment and Plan [name] is a 88 y.o. female with medical history significant of CAD, PAF not on anticoagulation, HTN, HLD, IDDM, PVD, COPD Gold stage I, CKD stage IIIa, chronic normocytic anemia, dementia, presented with cough shortness of breath.   Patient started to develop dry cough last 1 to 2 days.  No fever chills no chest pain.  Patient's son noticed patient was coughing yesterday afterno Patient was started on Dupixent every 2 weeks injection for her dermatitis about 2 months ago but denied any rash at this point.  Acute HFpEF decompensation in the setting of Multifocal Pneumonia -She has pulmonary edema secondary to CHF decompensation likely from uncontrolled HTN.   -Agreed with Lasix increased dose to 40 mg twice daily, monitor kidney function--hold more lasix since creat up--UOP >1500 cc. Pt overall feeling better -Discontinue Cardizem given acute CHF -Started Coreg twice daily -As needed hydralazine -Continue losartan and Imdur -Echocardiogram 45-50% with left global hypokinesis -  Cont IV ceftriaxone and azithromycin --change to po from am   Acute on chronic COPD exacerbation -Short course steroid, continue LABA, inhaled steroid, DuoNebs -Incentive spirometry --wean oxygen to room air if able to keep sats greater than 92%. Patient does not use oxygen at home. Patient may require short-term oxygen   SIRS -Secondary to CHF decompensation and COPD exacerbation,  doubt sepsis (ruled out) -- improved   Leukocytosis -suspected due to pneumonia -Denies any diarrhea, no significant skin infection   IDDM, with hyperglycemia -Resume home dose of NovoLog 10 units 3 times daily AC, add sliding scale. -- Resume long-acting insulin. -- Patient follows with Dr. Gabriel Carina at [place] [facility]  Microcytic anemia/IDA -- old records shows serum iron low in 2022. Will check iron studies and B12 -- takes iron polysaccharide daily. -- Discussed with patient's son Zetha Kuhar. Will give IV iron two doses today and tomorrow. Will refer to hematology as outpatient. Dr. Janese Banks is aware.   HTN -  As above.   CKD stage IIIa -Creatinine level stable, on Lasix, monitor kidney function  --creat better 1.16  Atopic dermatitis -Continue tacrolimus topical -Continue Dupixent   DVT prophylaxis: Lovenox Code Status: DNR Family Communication: Son over the phone Consults : none Level of care: Progressive Status is: Inpatient Remains inpatient appropriate because: IV antibiotics, IV diuresis anticipate discharge in 1 to 2 days. Try wean oxygen to room air    TOTAL TIME TAKING CARE OF THIS PATIENT: 35 minutes.  >50% time spent on counselling and coordination of care  Note: This dictation was prepared with Dragon dictation along with smaller phrase technology. Any transcriptional errors that result from this process are unintentional.  Fritzi Mandes M.D    [facility]   CC: Primary care physician; Jerrol Banana., MD

## 2022-06-17 NOTE — Progress Notes (Signed)
Physical Therapy Treatment Patient Details Name: [name] MRN: [id number] DOB: Jan 29, 1929 Today's Date: 06/17/2022   History of Present Illness 88 y.o. female with PMHx includes Afib, cervicalgia, COPD, DDD, DM, HLD, HTN, and dysphagia who presents to the ED from home with a chief complaint of shortness of breath.  Patient with a history of COPD not on home oxygen.  Endorses dry cough and progressive shortness of breath which worsened 3 hours ago.  Endorses associated chest tightness.    PT Comments    Pt standing at sink with OT present upon PT arrival; pt agreeable to PT session.  No c/o pain.  Per discussion with pt's nurse, trialed pt on room air during session (otherwise pt on 1 L O2 via nasal cannula).  During session pt SBA with transfers and CGA progressing to SBA ambulating 200 feet with RW use.  Mild SOB noted with ambulation.  SpO2 sats 92% or greater on room air during ambulation/sessions activities.  Will continue to focus on strengthening, balance, and progressive functional mobility during hospitalization.   Recommendations for follow up therapy are one component of a multi-disciplinary discharge planning process, led by the attending physician.  Recommendations may be updated based on patient status, additional functional criteria and insurance authorization.  Follow Up Recommendations  Home health PT     Assistance Recommended at Discharge Intermittent Supervision/Assistance  Patient can return home with the following A little help with walking and/or transfers;A little help with bathing/dressing/bathroom;Assistance with cooking/housework;Direct supervision/assist for medications management;Direct supervision/assist for financial management;Assist for transportation;Help with stairs or ramp for entrance   Equipment Recommendations  None recommended by PT    Recommendations for Other Services       Precautions / Restrictions Precautions Precautions:  Fall Precaution Comments: Aspiration Restrictions Weight Bearing Restrictions: No     Mobility  Bed Mobility               General bed mobility comments: Deferred (pt standing at sink with OT upon PT arrival)    Transfers Overall transfer level: Needs assistance Equipment used: Rolling walker (2 wheels) Transfers: Sit to/from Stand Sit to Stand: Supervision           General transfer comment: x2 transfers from recliner (steady and safe)    Ambulation/Gait Ambulation/Gait assistance: Min guard, Supervision Gait Distance (Feet): 200 Feet Assistive device: Rolling walker (2 wheels) Gait Pattern/deviations: Step-through pattern Gait velocity: decreased     General Gait Details: steady ambulation with RW use   Stairs             Wheelchair Mobility    Modified Rankin (Stroke Patients Only)       Balance Overall balance assessment: Needs assistance Sitting-balance support: No upper extremity supported, Feet supported Sitting balance-Leahy Scale: Normal Sitting balance - Comments: steady sitting reaching outside BOS   Standing balance support: No upper extremity supported Standing balance-Leahy Scale: Good Standing balance comment: steady standing reaching within BOS                            Cognition Arousal/Alertness: Awake/alert Behavior During Therapy: WFL for tasks assessed/performed Overall Cognitive Status: Within Functional Limits for tasks assessed                                          Exercises      General  Comments Nursing cleared pt for participation in physical therapy.  Pt agreeable to PT session.      Pertinent Vitals/Pain      Home Living                          Prior Function            PT Goals (current goals can now be found in the care plan section) Acute Rehab PT Goals Patient Stated Goal: to get stronger and go home PT Goal Formulation: With patient Time For Goal  Achievement: 06/30/22 Potential to Achieve Goals: Good Progress towards PT goals: Progressing toward goals    Frequency    Min 2X/week      PT Plan Current plan remains appropriate    Co-evaluation              AM-PAC PT "6 Clicks" Mobility   Outcome Measure  Help needed turning from your back to your side while in a flat bed without using bedrails?: None Help needed moving from lying on your back to sitting on the side of a flat bed without using bedrails?: None Help needed moving to and from a bed to a chair (including a wheelchair)?: A Little Help needed standing up from a chair using your arms (e.g., wheelchair or bedside chair)?: A Little Help needed to walk in hospital room?: A Little Help needed climbing 3-5 steps with a railing? : A Little 6 Click Score: 20    End of Session Equipment Utilized During Treatment: Gait belt Activity Tolerance: Patient tolerated treatment well Patient left: in chair;with call bell/phone within reach;with chair alarm set Nurse Communication: Mobility status;Precautions;Other (comment) (Pt's SpO2 sats during session) PT Visit Diagnosis: Difficulty in walking, not elsewhere classified (R26.2);Muscle weakness (generalized) (M62.81)     Time: 2355-7322 PT Time Calculation (min) (ACUTE ONLY): 19 min  Charges:  $Therapeutic Activity: 8-22 mins                     Leitha Bleak, PT 06/17/22, 4:49 PM

## 2022-06-18 DIAGNOSIS — I509 Heart failure, unspecified: Secondary | ICD-10-CM

## 2022-06-18 DIAGNOSIS — J189 Pneumonia, unspecified organism: Secondary | ICD-10-CM | POA: Diagnosis not present

## 2022-06-18 LAB — GLUCOSE, CAPILLARY: Glucose-Capillary: 99 mg/dL (ref 70–99)

## 2022-06-18 LAB — BASIC METABOLIC PANEL
Anion gap: 8 (ref 5–15)
BUN: 36 mg/dL — ABNORMAL HIGH (ref 8–23)
CO2: 29 mmol/L (ref 22–32)
Calcium: 8.4 mg/dL — ABNORMAL LOW (ref 8.9–10.3)
Chloride: 102 mmol/L (ref 98–111)
Creatinine, Ser: 1.07 mg/dL — ABNORMAL HIGH (ref 0.44–1.00)
GFR, Estimated: 49 mL/min — ABNORMAL LOW (ref >60)
Glucose, Bld: 103 mg/dL — ABNORMAL HIGH (ref 70–99)
Potassium: 3.4 mmol/L — ABNORMAL LOW (ref 3.5–5.1)
Sodium: 139 mmol/L (ref 135–145)

## 2022-06-18 MED ORDER — INSULIN GLARGINE 100 UNIT/ML ~~LOC~~ SOLN: 12.0000 [IU] | Freq: Every day | SUBCUTANEOUS | 11 refills | Status: DC

## 2022-06-18 MED ORDER — VITAMIN B-12 1000 MCG PO TABS: 1000.0000 ug | ORAL_TABLET | Freq: Every day | ORAL | Status: DC

## 2022-06-18 MED ORDER — CEFUROXIME AXETIL 250 MG PO TABS: 250.0000 mg | ORAL_TABLET | Freq: Two times a day (BID) | ORAL | 0 refills | Status: DC

## 2022-06-18 MED ORDER — POLYSACCHARIDE IRON COMPLEX 150 MG PO CAPS: 150.0000 mg | ORAL_CAPSULE | Freq: Every day | ORAL | 3 refills | Status: DC

## 2022-06-18 MED ORDER — CYANOCOBALAMIN 1000 MCG PO TABS: 1000.0000 ug | ORAL_TABLET | Freq: Every day | ORAL | 2 refills | Status: DC

## 2022-06-18 MED ORDER — HUMALOG KWIKPEN 100 UNIT/ML ~~LOC~~ SOPN: 6.0000 [IU] | PEN_INJECTOR | Freq: Three times a day (TID) | SUBCUTANEOUS | 11 refills | Status: DC

## 2022-06-18 MED ORDER — CARVEDILOL 6.25 MG PO TABS: 6.2500 mg | ORAL_TABLET | Freq: Two times a day (BID) | ORAL | 0 refills | Status: DC

## 2022-06-18 NOTE — Progress Notes (Signed)
SATURATION QUALIFICATIONS: (This note is used to comply with regulatory documentation for home oxygen)  Patient Saturations on Room Air at Rest = 94%  Patient Saturations on Room Air while Ambulating = 92%  Patient Saturations on 0 Liters of oxygen while Ambulating = 92%  No Home 02 Required

## 2022-06-18 NOTE — Discharge Summary (Signed)
Physician Discharge Summary   Patient: [name] MRN: [id number] DOB: 01/23/1929  Admit date:     06/15/2022  Discharge date: 06/18/22  Discharge Physician: Fritzi Mandes   PCP: Jerrol Banana., MD   Recommendations at discharge:    F/u Dr. Nehemiah Massed for CHF follow-up follow-up Dr. Janese Banks for iron deficiency anemia in 1 to 2 weeks follow-up PCP in 1 to 2 weeks  Discharge Diagnoses: Principal Problem:   CHF (congestive heart failure) ([place]) Active Problems:   COPD exacerbation ([place])   Community acquired pneumonia   Hospital Course:  [name] is a 88 y.o. female with medical history significant of CAD, PAF not on anticoagulation, HTN, HLD, IDDM, PVD, COPD Gold stage I, CKD stage IIIa, chronic normocytic anemia, dementia, presented with cough shortness of breath.   Patient started to develop dry cough last 1 to 2 days.  No fever chills no chest pain.  Patient's son noticed patient was coughing yesterday afterno Patient was started on Dupixent every 2 weeks injection for her dermatitis about 2 months ago but denied any rash at this point.   Acute HFpEF decompensation in the setting of Multifocal Pneumonia -She has pulmonary edema secondary to CHF decompensation likely from uncontrolled HTN.   -Agreed with Lasix increased dose to 40 mg twice daily, monitor kidney function--hold more lasix since creat up--UOP >1500 cc. Pt overall feeling better. No Lasix at discharge for now. -- Follow-up with Dr. Nehemiah Massed as outpatient. -Discontinue Cardizem given acute CHF -Started Coreg twice daily -Continue losartan and Imdur -Echocardiogram 45-50% with left global hypokinesis -  Cont IV ceftriaxone and azithromycin --change to po from am   Acute on chronic COPD exacerbation -Short course steroid, continue LABA, inhaled steroid, DuoNebs -Incentive spirometry --wean oxygen to room air if able to keep sats greater than 92%. Patient does not use oxygen at home.  -- No  respiratory distress. Sats 94% on room air.   SIRS -Secondary to CHF decompensation and COPD exacerbation, doubt sepsis (ruled out) -- improved   Leukocytosis -suspected due to pneumonia -Denies any diarrhea, no significant skin infection   IDDM, with hyperglycemia -Resume home dose of NovoLog @ 6 units 3 times daily AC, -- Resume long-acting insulin 12 units -- Patient follows with Dr. Gabriel Carina at [facility]   Microcytic anemia/IDA -- old records shows serum iron low in 2022. Will check iron studies and B12 -- takes iron polysaccharide daily. -- Discussed with patient's son [name].  -- Patient received one dose of IV iron. Lost her IV. Will continue PO iron polysaccharides and added PO B12  --will refer to hematology as outpatient. Dr. Janese Banks is aware.   HTN -As above.   CKD stage IIIa -Creatinine level stable, on Lasix, monitor kidney function  --creat better 1.16   Atopic dermatitis -Continue tacrolimus topical -Continue Dupixent  I'll stable for discharge. Discharge plan discussed with son at bedside   DVT prophylaxis: Lovenox Code Status: DNR Family Communication: Son Consults : none      Disposition: Home health Diet recommendation:  Discharge Diet Orders (From admission, onward)     Start     Ordered   06/18/22 0000  Diet - low sodium heart healthy        06/18/22 0931           Cardiac and Carb modified diet DISCHARGE MEDICATION: Allergies as of 06/18/2022       Reactions   Bacitracin-neomycin-polymyxin Rash   Neomycin-bacitracin Zn-polymyx Swelling, Rash  Latex Rash   Lidocaine Rash   Aricept [donepezil Hcl] Diarrhea   Benzalkonium Chloride Itching, Swelling   Ibuprofen    Other reaction(s): Dizziness Heart fluttering. Tachycardia. Tachycardia.   Lidocaine Hcl Itching   Per pt, "all caine meds cause severe itching"   Valdecoxib Nausea And Vomiting   Albuterol Rash   Tape Rash   blisters   Triamcinolone Rash         Medication List     STOP taking these medications    Advair HFA 230-21 MCG/ACT inhaler Generic drug: fluticasone-salmeterol   cetirizine 10 MG tablet Commonly known as: ZYRTEC   magnesium oxide 400 MG tablet Commonly known as: MAG-OX   montelukast 10 MG tablet Commonly known as: SINGULAIR   pantoprazole 40 MG tablet Commonly known as: PROTONIX   traZODone 50 MG tablet Commonly known as: DESYREL       TAKE these medications    albuterol 108 (90 Base) MCG/ACT inhaler Commonly known as: VENTOLIN HFA INHALE 2 PUFFS INTO THE LUNGS EVERY 4 HOURS AS NEEDED FOR WHEEZING ORSHORTNESS OF BREATH   aspirin 325 MG tablet Take 325 mg by mouth daily. Reported on 01/05/2016   atorvastatin 10 MG tablet Commonly known as: LIPITOR TAKE 1 TABLET BY MOUTH ONCE DAILY.   carvedilol 6.25 MG tablet Commonly known as: COREG Take 1 tablet (6.25 mg total) by mouth 2 (two) times daily with a meal.   cefUROXime 250 MG tablet Commonly known as: CEFTIN Take 1 tablet (250 mg total) by mouth 2 (two) times daily with a meal for 6 days.   clopidogrel 75 MG tablet Commonly known as: PLAVIX TAKE 1 TABLET BY MOUTH DAILY   cyanocobalamin 1000 MCG tablet Take 1 tablet (1,000 mcg total) by mouth daily.   Dupixent 300 MG/2ML Sopn Generic drug: Dupilumab Inject 300 mg into the skin every 14 (fourteen) days. Starting at day 15 for maintenance.   ezetimibe 10 MG tablet Commonly known as: ZETIA TAKE 1 TABLET BY MOUTH DAILY   fexofenadine 180 MG tablet Commonly known as: ALLEGRA Take 180 mg by mouth daily.   FLUoxetine 10 MG capsule Commonly known as: PROZAC TAKE 1 CAPSULE BY MOUTH 3 TIMES DAILY.   glucose blood test strip ACCU-CHEK AVIVA PLUS TEST STRP Use to check sugars 3 times daily.   HumaLOG KwikPen 100 UNIT/ML KwikPen Generic drug: insulin lispro Inject 6 Units into the skin 3 (three) times daily. What changed: how much to take   insulin glargine 100 UNIT/ML injection Commonly  known as: Lantus Inject 0.12 mLs (12 Units total) into the skin at bedtime. What changed: how much to take   iron polysaccharides 150 MG capsule Commonly known as: NIFEREX Take 1 capsule (150 mg total) by mouth daily.   isosorbide mononitrate 30 MG 24 hr tablet Commonly known as: IMDUR Take 30 mg by mouth daily.   lansoprazole 30 MG capsule Commonly known as: PREVACID TAKE 1 CAPSULE BY MOUTH ONCE DAILY AT NOON What changed: See the new instructions.   losartan 100 MG tablet Commonly known as: COZAAR TAKE 1 TABLET BY MOUTH DAILY   memantine 10 MG tablet Commonly known as: NAMENDA Take 10 mg by mouth 2 (two) times daily.   QUEtiapine 50 MG tablet Commonly known as: SEROQUEL Take 50 mg by mouth at bedtime.   tacrolimus 0.1 % ointment Commonly known as: PROTOPIC Apply topically 2 (two) times daily. Apply to aa's of rash at body   vitamin E 1000 UNIT capsule Take 1,000 Units  by mouth daily.        Follow-up Information     Jerrol Banana., MD. Schedule an appointment as soon as possible for a visit in 1 week(s).   Specialty: Family Medicine Contact information: [address] [place] [address] [phone]         Sindy Guadeloupe, MD Follow up.   Specialty: Oncology Why: for Iron def anemia Contact information: [place] [address] [phone]         Corey Skains, MD. Schedule an appointment as soon as possible for a visit in 1 week(s).   Specialty: Cardiology Why: hosp f/u [facility] Contact information: [address] [facility] [place] [address] [phone]                Discharge Exam: Danley Danker Weights   06/16/22 0500 06/17/22 0540 06/18/22 0516  Weight: 63 kg 63.8 kg 61.5 kg     Condition at discharge: fair  The results of significant diagnostics from this hospitalization (including imaging, microbiology, ancillary and laboratory) are listed below for  reference.   Imaging Studies: ECHOCARDIOGRAM COMPLETE  Result Date: 06/16/2022    ECHOCARDIOGRAM REPORT   Patient Name:   [name] Date of Exam: 06/15/2022 Medical Rec #:  [id number]            Height:       62.0 in Accession #:    [id number]           Weight:       136.7 lb Date of Birth:  03-14-1929            BSA:          1.626 m Patient Age:    5 years             BP:           148/77 mmHg Patient Gender: F                    HR:           84 bpm. Exam Location:  [facility] Procedure: 2D Echo, Cardiac Doppler and Color Doppler Indications:     Q11.94 Acute Diastolic CHF  History:         Patient has no prior history of Echocardiogram examinations.                  COPD, Arrythmias:Atrial Fibrillation; Risk                  Factors:Hypertension, Dyslipidemia and Diabetes.  Sonographer:     Cresenciano Lick RDCS Referring Phys:  [id number] Lequita Halt Diagnosing Phys: Nelva Bush MD IMPRESSIONS  1. Left ventricular ejection fraction, by estimation, is 45 to 50%. The left ventricle has mildly decreased function. The left ventricle demonstrates global hypokinesis. There is mild left ventricular hypertrophy. Left ventricular diastolic parameters are indeterminate.  2. Right ventricular systolic function is mildly reduced. The right ventricular size is normal. There is normal pulmonary artery systolic pressure.  3. Left atrial size was mildly dilated.  4. The mitral valve is degenerative. Mild mitral valve regurgitation.  5. The aortic valve is tricuspid. There is mild calcification of the aortic valve. There is mild thickening of the aortic valve. Aortic valve regurgitation is not visualized. Aortic valve sclerosis is present, with no evidence of aortic valve stenosis.  6. The inferior vena cava is normal in size with <50%  respiratory variability, suggesting right atrial pressure of 8 mmHg. FINDINGS  Left Ventricle: Left ventricular ejection fraction, by estimation, is 45 to 50%. The left ventricle  has mildly decreased function. The left ventricle demonstrates global hypokinesis. The left ventricular internal cavity size was normal in size. There is  mild left ventricular hypertrophy. Left ventricular diastolic function could not be evaluated due to atrial fibrillation. Left ventricular diastolic parameters are indeterminate. Right Ventricle: The right ventricular size is normal. No increase in right ventricular wall thickness. Right ventricular systolic function is mildly reduced. There is normal pulmonary artery systolic pressure. The tricuspid regurgitant velocity is 2.18 m/s, and with an assumed right atrial pressure of 8 mmHg, the estimated right ventricular systolic pressure is 35.5 mmHg. Left Atrium: Left atrial size was mildly dilated. Right Atrium: Right atrial size was normal in size. Pericardium: There is no evidence of pericardial effusion. Mitral Valve: The mitral valve is degenerative in appearance. There is mild thickening of the mitral valve leaflet(s). Mild mitral valve regurgitation. Tricuspid Valve: The tricuspid valve is normal in structure. Tricuspid valve regurgitation is trivial. Aortic Valve: The aortic valve is tricuspid. There is mild calcification of the aortic valve. There is mild thickening of the aortic valve. Aortic valve regurgitation is not visualized. Aortic valve sclerosis is present, with no evidence of aortic valve stenosis. Pulmonic Valve: The pulmonic valve was not well visualized. Pulmonic valve regurgitation is not visualized. No evidence of pulmonic stenosis. Aorta: The aortic root and ascending aorta are structurally normal, with no evidence of dilitation. Venous: The inferior vena cava is normal in size with less than 50% respiratory variability, suggesting right atrial pressure of 8 mmHg. IAS/Shunts: No atrial level shunt detected by color flow Doppler.  LEFT VENTRICLE PLAX 2D LVIDd:         4.30 cm LVIDs:         3.20 cm LV PW:         1.30 cm LV IVS:        1.20  cm LVOT diam:     2.00 cm LV SV:         36 LV SV Index:   22 LVOT Area:     3.14 cm  RIGHT VENTRICLE             IVC RV Basal diam:  3.50 cm     IVC diam: 1.50 cm RV S prime:     11.80 cm/s TAPSE (M-mode): 1.6 cm LEFT ATRIUM             Index        RIGHT ATRIUM           Index LA diam:        4.00 cm 2.46 cm/m   RA Area:     13.90 cm LA Vol (A2C):   81.8 ml 50.30 ml/m  RA Volume:   33.60 ml  20.66 ml/m LA Vol (A4C):   42.3 ml 26.01 ml/m LA Biplane Vol: 60.4 ml 37.14 ml/m  AORTIC VALVE LVOT Vmax:   69.58 cm/s LVOT Vmean:  49.240 cm/s LVOT VTI:    0.115 m  AORTA Ao Root diam: 3.30 cm Ao Asc diam:  3.50 cm MV E velocity: 149.00 cm/s  TRICUSPID VALVE                             TR Peak grad:   19.0 mmHg  TR Vmax:        218.00 cm/s                              SHUNTS                             Systemic VTI:  0.12 m                             Systemic Diam: 2.00 cm Nelva Bush MD Electronically signed by Nelva Bush MD Signature Date/Time: 06/16/2022/7:04:40 AM    Final    CT CHEST WO CONTRAST  Result Date: 06/15/2022 CLINICAL DATA:  Pneumonia, complication suspected, xray done EXAM: CT CHEST WITHOUT CONTRAST TECHNIQUE: Multidetector CT imaging of the chest was performed following the standard protocol without IV contrast. RADIATION DOSE REDUCTION: This exam was performed according to the departmental dose-optimization program which includes automated exposure control, adjustment of the mA and/or kV according to patient size and/or use of iterative reconstruction technique. COMPARISON:  Chest x-ray the same day.  CT of the chest 03/31/2020. FINDINGS: Cardiovascular: Mild cardiomegaly. Coronary artery and aortic atherosclerosis. No pericardial effusion. Mediastinum/Nodes: No enlarged mediastinal or axillary lymph nodes. Thyroid gland, trachea, and esophagus demonstrate no significant findings. Lungs/Pleura: Patchy consolidative and ground-glass opacities in all lobes of the  lung. Slight interlobular septal thickening. Small bilateral pleural effusions. No pneumothorax. Upper Abdomen: No acute abnormality. Musculoskeletal: No fracture is seen. IMPRESSION: 1. Patchy consolidative and ground-glass opacities in all lobes of the lung, most consistent with multifocal pneumonia. 2. Small bilateral pleural effusions and slight interlobular septal thickening, which may represent mild superimposed interstitial edema. 3. Aortic Atherosclerosis (ICD10-I70.0) and coronary artery atherosclerosis. 4. Mild cardiomegaly. Electronically Signed   By: Margaretha Sheffield M.D.   On: 06/15/2022 11:43   DG Chest Port 1 View  Result Date: 06/15/2022 CLINICAL DATA:  Worsening shortness of breath EXAM: PORTABLE CHEST 1 VIEW COMPARISON:  06/15/2022 FINDINGS: Chronic cardiomegaly. Interstitial coarsening with [employer], similar to prior and new from 2022. Indistinct airspace type density at the bases. No visible effusion or pneumothorax. Artifact from EKG leads. IMPRESSION: CHF pattern.  Cannot exclude superimposed pneumonia. Electronically Signed   By: Jorje Guild M.D.   On: 06/15/2022 06:22   DG Chest Portable 1 View  Result Date: 06/15/2022 CLINICAL DATA:  Shortness of breath onset 3 hours ago. Hypoxia with saturation 85%. EXAM: PORTABLE CHEST 1 VIEW COMPARISON:  Portable chest 02/09/2021 FINDINGS: The heart has enlarged compared to the prior study with development of vascular engorgement and flow cephalization and moderate diffuse interstitial edema with a basal gradient. There are patchy hazy opacities in the mid to lower lung fields which probably due to alveolar edema, less likely multifocal pneumonia, with scattered atelectatic bands in the lower zones. Small pleural effusions are beginning to form. There is aortic atherosclerosis with stable mediastinal configuration. There is thoracic spondylosis. IMPRESSION: Moderate CHF findings with vascular engorgement and flow cephalization and  moderate interstitial edema. Additional opacities in the mid to lower lung fields most likely due to alveolar edema, less likely multifocal pneumonia, with small pleural effusions. Progress chest films recommended depending on clinical response. Electronically Signed   By: Telford Nab M.D.   On: 06/15/2022 02:03    Microbiology: Results for orders placed or performed during the hospital encounter of 06/15/22  SARS  Coronavirus 2 by RT PCR (hospital order, performed in [facility] hospital lab) *cepheid single result test* Anterior Nasal Swab     Status: None   Collection Time: 06/15/22  1:54 AM   Specimen: Anterior Nasal Swab  Result Value Ref Range Status   SARS Coronavirus 2 by RT PCR NEGATIVE NEGATIVE Final    Comment: (NOTE) SARS-CoV-2 target nucleic acids are NOT DETECTED.  The SARS-CoV-2 RNA is generally detectable in upper and lower respiratory specimens during the acute phase of infection. The lowest concentration of SARS-CoV-2 viral copies this assay can detect is 250 copies / mL. A negative result does not preclude SARS-CoV-2 infection and should not be used as the sole basis for treatment or other patient management decisions.  A negative result may occur with improper specimen collection / handling, submission of specimen other than nasopharyngeal swab, presence of viral mutation(s) within the areas targeted by this assay, and inadequate number of viral copies (<250 copies / mL). A negative result must be combined with clinical observations, patient history, and epidemiological information.  Fact Sheet for Patients:   https://www..info/  Fact Sheet for Healthcare Providers: https://hall.com/  This test is not yet approved or  cleared by the [place] FDA and has been authorized for detection and/or diagnosis of SARS-CoV-2 by FDA under an Emergency Use Authorization (EUA).  This EUA will remain in effect (meaning this  test can be used) for the duration of the COVID-19 declaration under Section 564(b)(1) of the Act, 21 U.S.C. section 360bbb-3(b)(1), unless the authorization is terminated or revoked sooner.  Performed at [facility], [address] [place], [place] [address]   Culture, blood (routine x 2)     Status: None (Preliminary result)   Collection Time: 06/15/22  2:46 AM   Specimen: BLOOD  Result Value Ref Range Status   Specimen Description BLOOD LEFT FA  Final   Special Requests   Final    BOTTLES DRAWN AEROBIC AND ANAEROBIC Blood Culture results may not be optimal due to an inadequate volume of blood received in culture bottles   Culture   Final    NO GROWTH 3 DAYS Performed at [facility], [address] [place], [place] [address]    Report Status PENDING  Incomplete  Culture, blood (routine x 2)     Status: None (Preliminary result)   Collection Time: 06/15/22  2:46 AM   Specimen: BLOOD  Result Value Ref Range Status   Specimen Description BLOOD RIGHT [facility]  Final   Special Requests   Final    BOTTLES DRAWN AEROBIC AND ANAEROBIC Blood Culture results may not be optimal due to an inadequate volume of blood received in culture bottles   Culture   Final    NO GROWTH 3 DAYS Performed at [facility], [address] [place], [place] [address]    Report Status PENDING  Incomplete    Labs: CBC: Recent Labs  Lab 06/15/22 0154 06/17/22 0720  WBC 23.5* 10.1  NEUTROABS 19.5*  --   HGB 9.9* 7.9*  HCT 33.3* 25.5*  MCV 75.5* 72.9*  PLT 346 419   Basic Metabolic Panel: Recent Labs  Lab 06/15/22 0154 06/16/22 0432 06/17/22 0720 06/18/22 0739  NA 140 139 136 139  K 3.8 3.6 3.8 3.4*  CL 103 101 101 102  CO2 '23 26 29 29  '$ GLUCOSE 272* 138* 115* 103*  BUN 21 31* 38* 36*  CREATININE 1.07* 1.35* 1.16* 1.07*  CALCIUM 8.5* 8.5* 7.9*  8.4*   Liver Function Tests: Recent Labs  Lab 06/15/22 0154  AST 20  ALT 12  ALKPHOS 95  BILITOT 0.9  PROT 7.2   ALBUMIN 3.9   CBG: Recent Labs  Lab 06/17/22 0829 06/17/22 1153 06/17/22 1556 06/17/22 2051 06/18/22 0722  GLUCAP 117* 114* 112* 159* 99    Discharge time spent: greater than 30 minutes.  Signed: Fritzi Mandes, MD [facility] 06/18/2022

## 2022-06-18 NOTE — TOC Transition Note (Signed)
Transition of Care [facility]) - CM/SW Discharge Note   Patient Details  Name: [name] MRN: [id number] Date of Birth: 01/20/1929  Transition of Care [facility]) CM/SW Contact:  Alberteen Sam, LCSW Phone Number: 06/18/2022, 9:40 AM   Clinical Narrative:     Patient will DC today home with [facility] PT and RN through Meredeth Ide with [facility] informed of discharge today.  PCP is Dr. Rosanna Randy, she goes to [place] and has a walker at home with no dme needs.   No other needs identified at this time.  CSW signing off.  [place], [place]   Final next level of care: Home w Home Health Services Barriers to Discharge: No Barriers Identified   Patient Goals and CMS Choice Patient states their goals for this hospitalization and ongoing recovery are:: to go home CMS Medicare.gov Compare Post Acute Care list provided to:: Patient Choice offered to / list presented to : Patient  Discharge Placement                       Discharge Plan and Services                          HH Arranged: PT, RN [facility] Agency: [place] Date [facility] Agency Contacted: 06/16/22 Time [place]: 1107 Representative spoke with at [place]: [place] Determinants of Health ([place]) Interventions     Readmission Risk Interventions     No data to display

## 2022-06-18 NOTE — Progress Notes (Signed)
PT AVS reviewed and pt verbalized understanding of all DC teaching and instructions. PT has all pt belongings.

## 2022-06-19 NOTE — Progress Notes (Unsigned)
   Patient ID: [name], female    DOB: [birth year], 88 y.o.   MRN: [id number]  HPI  [name] is a 88 y/o female with a history of  Echo report from 06/15/22 reviewed and showed an EF of 45-50% along with mild LVH/ LAE and mild MR.   Admitted 06/15/22 due to cough and shortness of breath. Lasix increased but then held due to worsening renal function. Cardizem stopped. Discharged after 3 days.   She presents today for her initial visit with a chief complaint of  Review of Systems    Physical Exam  Assessment & Plan:  1: Chronic heart failure with mildly reduced ejection fraction- - NYHA class  - BNP 06/15/22 was 412.5  2: HTN- - BP - saw PCP Rosanna Randy) 04/12/22 - BMP 06/18/22 reviewed and showed sodium 139, potassium 3.4, creatinine 1.07 and GFR 49  3: DM with CKD- - saw endocrinology ([place]) 05/18/22 - A1c 06/15/22 was 6.9%  4: Anemia- - to see hematology Janese Banks) 06/21/22 - Hg 06/17/22 was 7.9  5: PAF- - saw cardiology Nehemiah Massed) 08/15/20

## 2022-06-20 LAB — CULTURE, BLOOD (ROUTINE X 2)
Culture: NO GROWTH
Culture: NO GROWTH

## 2022-06-21 ENCOUNTER — Ambulatory Visit: Payer: Medicare Other | Attending: Family | Admitting: Family

## 2022-06-21 ENCOUNTER — Encounter: Payer: Self-pay | Admitting: Oncology

## 2022-06-21 ENCOUNTER — Inpatient Hospital Stay: Payer: Medicare Other | Attending: Oncology | Admitting: Oncology

## 2022-06-21 ENCOUNTER — Inpatient Hospital Stay: Payer: Medicare Other

## 2022-06-21 ENCOUNTER — Encounter: Payer: Self-pay | Admitting: Family

## 2022-06-21 VITALS — BP 131/67 | HR 83 | Resp 16 | Ht 63.0 in | Wt 140.1 lb

## 2022-06-21 VITALS — Temp 97.7°F | Wt 139.6 lb

## 2022-06-21 DIAGNOSIS — I480 Paroxysmal atrial fibrillation: Secondary | ICD-10-CM | POA: Diagnosis not present

## 2022-06-21 DIAGNOSIS — I130 Hypertensive heart and chronic kidney disease with heart failure and stage 1 through stage 4 chronic kidney disease, or unspecified chronic kidney disease: Secondary | ICD-10-CM | POA: Diagnosis not present

## 2022-06-21 DIAGNOSIS — D649 Anemia, unspecified: Secondary | ICD-10-CM | POA: Insufficient documentation

## 2022-06-21 DIAGNOSIS — J449 Chronic obstructive pulmonary disease, unspecified: Secondary | ICD-10-CM | POA: Diagnosis not present

## 2022-06-21 DIAGNOSIS — Z800 Family history of malignant neoplasm of digestive organs: Secondary | ICD-10-CM | POA: Insufficient documentation

## 2022-06-21 DIAGNOSIS — Z85828 Personal history of other malignant neoplasm of skin: Secondary | ICD-10-CM | POA: Diagnosis not present

## 2022-06-21 DIAGNOSIS — Z794 Long term (current) use of insulin: Secondary | ICD-10-CM | POA: Diagnosis not present

## 2022-06-21 DIAGNOSIS — I340 Nonrheumatic mitral (valve) insufficiency: Secondary | ICD-10-CM | POA: Insufficient documentation

## 2022-06-21 DIAGNOSIS — I10 Essential (primary) hypertension: Secondary | ICD-10-CM

## 2022-06-21 DIAGNOSIS — N189 Chronic kidney disease, unspecified: Secondary | ICD-10-CM | POA: Insufficient documentation

## 2022-06-21 DIAGNOSIS — N1831 Chronic kidney disease, stage 3a: Secondary | ICD-10-CM | POA: Diagnosis not present

## 2022-06-21 DIAGNOSIS — Z8041 Family history of malignant neoplasm of ovary: Secondary | ICD-10-CM | POA: Insufficient documentation

## 2022-06-21 DIAGNOSIS — Z79899 Other long term (current) drug therapy: Secondary | ICD-10-CM | POA: Insufficient documentation

## 2022-06-21 DIAGNOSIS — E538 Deficiency of other specified B group vitamins: Secondary | ICD-10-CM | POA: Diagnosis not present

## 2022-06-21 DIAGNOSIS — I5022 Chronic systolic (congestive) heart failure: Secondary | ICD-10-CM | POA: Diagnosis not present

## 2022-06-21 DIAGNOSIS — E785 Hyperlipidemia, unspecified: Secondary | ICD-10-CM | POA: Insufficient documentation

## 2022-06-21 DIAGNOSIS — D509 Iron deficiency anemia, unspecified: Secondary | ICD-10-CM | POA: Diagnosis not present

## 2022-06-21 DIAGNOSIS — E1122 Type 2 diabetes mellitus with diabetic chronic kidney disease: Secondary | ICD-10-CM

## 2022-06-21 NOTE — Progress Notes (Signed)
Hematology/Oncology Consult note [facility] [phone] [phone]  Patient Care Team: Jerrol Banana., MD as PCP - General (Family Medicine) Gabriel Carina Betsey Holiday, MD as Physician Assistant (Endocrinology) Estill Cotta, MD as Consulting Physician (Ophthalmology) Lucky Cowboy Erskine Squibb, MD as Referring Physician (Vascular Surgery) Corey Skains, MD as Consulting Physician (Cardiology) Ralene Bathe, MD as Consulting Physician (Dermatology) Gardiner Barefoot, DPM as Consulting Physician (Podiatry)   Name of the patient: [name]  [id number]  1928-12-18    Reason for referral-iron deficiency anemia   Referring physician-Dr. Rosanna Randy  Date of visit: 06/21/22   History of presenting illness-patient is a 88 year old female with a past medical history significant for type 2 diabetes hypertension hyperlipidemia CHF who has been referred forFor anemia.  Most recent labs from 06/17/2022 showed H&H of 7.9/25.5 with an MCV of 72.9.  Platelets and white count was normal.  B12 level was low at 249.  Iron studies showed low iron saturation of 4% and low serum iron of 15.  Ferritin was not checked.  Looking back at her prior CBCs patient's hemoglobin was around 10 up until May 2023.  Patient is here with her son today.  States that she may have noticed blood once in her stool but her family has not noticed it.  Denies any dark melanotic stools.  Reports ongoing fatigue.  Last colonoscopy was back in 2010.  ECOG PS- 2  Pain scale- 0   Review of systems- Review of Systems  Constitutional:  Positive for malaise/fatigue. Negative for chills, fever and weight loss.  HENT:  Negative for congestion, ear discharge and nosebleeds.   Eyes:  Negative for blurred vision.  Respiratory:  Negative for cough, hemoptysis, sputum production, shortness of breath and wheezing.   Cardiovascular:  Negative for chest pain, palpitations, orthopnea and claudication.   Gastrointestinal:  Negative for abdominal pain, blood in stool, constipation, diarrhea, heartburn, melena, nausea and vomiting.  Genitourinary:  Negative for dysuria, flank pain, frequency, hematuria and urgency.  Musculoskeletal:  Negative for back pain, joint pain and myalgias.  Skin:  Negative for rash.  Neurological:  Negative for dizziness, tingling, focal weakness, seizures, weakness and headaches.  Endo/Heme/Allergies:  Does not bruise/bleed easily.  Psychiatric/Behavioral:  Negative for depression and suicidal ideas. The patient does not have insomnia.     Allergies  Allergen Reactions   Bacitracin-Neomycin-Polymyxin Rash   Neomycin-Bacitracin Zn-Polymyx Swelling and Rash   Latex Rash   Lidocaine Rash   Aricept [Donepezil Hcl] Diarrhea   Benzalkonium Chloride Itching and Swelling   Ibuprofen     Other reaction(s): Dizziness Heart fluttering. Tachycardia. Tachycardia.   Lidocaine Hcl Itching    Per pt, "all caine meds cause severe itching"   Valdecoxib Nausea And Vomiting   Albuterol Rash   Tape Rash    blisters   Triamcinolone Rash    Patient Active Problem List   Diagnosis Date Noted   Community acquired pneumonia    CHF (congestive heart failure) ([place]) 06/15/2022   Pain of left hip joint 03/08/2022   History of falling 07/14/2021   Neurocognitive disorder with Lewy bodies (CODE) ([place]) 07/07/2021   Atherosclerosis of native arteries of the extremities with ulceration ([place]) 04/07/2021   PSVT (paroxysmal supraventricular tachycardia) ([place]) 71/69/6789   Acute metabolic encephalopathy 38/06/1750   Frequent falls 02/10/2021   COVID-19 virus infection 02/10/2021   Chronic kidney disease, stage 3a ([place]) 02/10/2021   Dementia ([place]) 02/10/2021   Garbled speech, intermittent 02/10/2021   Laceration  without foreign body, left ankle, subsequent encounter 02/10/2021   AMS (altered mental status) 02/10/2021   Osteoarthritis of left shoulder 10/27/2020   Type 2 diabetes  mellitus with diabetic peripheral angiopathy without gangrene ([place]) 09/27/2020   Personal history of malignant melanoma of skin 09/27/2020   Long term (current) use of insulin ([place]) 09/27/2020   Coronary artery disease involving native coronary artery of native heart 04/17/2020   Celiac artery stenosis ([place]) 06/12/2019   Bilateral lower abdominal cramping 04/20/2019   UTI symptoms 04/20/2019   History of rectal bleeding 03/26/2019   DM type 2 with diabetic peripheral neuropathy ([place]) 12/14/2018   Arthropathy of lumbar facet joint 09/04/2018   Degeneration of lumbar intervertebral disc 09/04/2018   Spondylolisthesis, grade 1 09/04/2018   Lewy body dementia ([place]) 02/17/2018   PVD (peripheral vascular disease) ([place]) 08/16/2017   Pain in limb 07/27/2016   Hallucinations 07/07/2016   Chest pain 07/01/2016   Depression 06/23/2016   Hypokalemia 09/03/2015   Insomnia 07/23/2015   Headache 07/23/2015   Type 2 diabetes mellitus with vascular disease ([place]) 06/24/2015   Chronic vulvitis 06/02/2015   Allergic rhinitis 05/30/2015   Anemia 05/30/2015   Back ache 05/30/2015   Body mass index (BMI) of 29.0-29.9 in adult 05/30/2015   Edema 05/30/2015   Dilatation of esophagus 05/30/2015   Fall 05/30/2015   H/O deep venous thrombosis 05/30/2015   Hypercholesteremia 05/30/2015   Heart & renal disease, hypertensive, with heart failure ([place]) 05/30/2015   Hyponatremia 05/30/2015   Calculus of kidney 05/30/2015   Gonalgia 05/30/2015   Psoriasis 05/30/2015   Avitaminosis D 05/30/2015   Cough 04/01/2015   Combined fat and carbohydrate induced hyperlipemia 12/30/2014   Paroxysmal atrial fibrillation ([place]) 07/10/2014   Abnormal gait 07/08/2014   Anxiety state 07/08/2014   Chronic kidney disease 07/08/2014   Acid reflux 07/08/2014   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[place]) 07/08/2014   COPD exacerbation ([place]) 07/08/2014   Type II diabetes mellitus with renal manifestations ([place]) 07/08/2014   Benign  essential HTN 06/26/2014   Carotid artery narrowing 06/26/2014   Myalgia 06/26/2014   Basal cell carcinoma of face 05/01/2014   Disease of female genital organs 11/28/2012   Incomplete bladder emptying 09/05/2012   Excessive urination at night 08/15/2012   Basal cell carcinoma of ear 10/26/2011     Past Medical History:  Diagnosis Date   Anemia    Angina pectoris (HCC)    Atrial fibrillation (HCC)    Basal cell carcinoma    nose   Cervicalgia    CHF (congestive heart failure) (HCC)    Chronic back pain    Chronic kidney disease    Chronic obstructive pulmonary disease (COPD) (HCC)    COPD (chronic obstructive pulmonary disease) (HCC)    DDD (degenerative disc disease), lumbar    Diabetes mellitus without complication ([place])    Dysphagia    Hyperlipemia    Hypertension    Squamous cell carcinoma of skin    left lateral pretibial   Vulvovaginitis      Past Surgical History:  Procedure Laterality Date   gall bladder     melanoma     removal neck and back   PERIPHERAL VASCULAR CATHETERIZATION Left 01/05/2016   Procedure: Lower Extremity Angiography;  Surgeon: Algernon Huxley, MD;  Location: [facility];  Service: Cardiovascular;  Laterality: Left;   PERIPHERAL VASCULAR CATHETERIZATION  01/05/2016   Procedure: Lower Extremity Intervention;  Surgeon: Algernon Huxley, MD;  Location: [facility] INV[facility]IVE [facility]  LAB;  Service: Cardiovascular;;   ureterolithiasis     calculus removed   VAGINAL HYSTERECTOMY     VISCERAL ANGIOGRAPHY N/A 05/10/2019   Procedure: VISCERAL ANGIOGRAPHY;  Surgeon: Algernon Huxley, MD;  Location: [place] [facility];  Service: Cardiovascular;  Laterality: N/A;   VULVA / PERINEUM BIOPSY  05/29/2015    Social History   Socioeconomic History   Marital status: Widowed    Spouse name: Not on file   Number of children: 3   Years of education: Not on file   Highest education level: 8th grade  Occupational History   Occupation: retired  Tobacco Use   Smoking  status: Never   Smokeless tobacco: Never  Vaping Use   Vaping Use: Never used  Substance and Sexual Activity   Alcohol use: No   Drug use: No   Sexual activity: Never  Other Topics Concern   Not on file  Social History Narrative   Not on file   Social Determinants of Health   Financial Resource Strain: Low Risk  (05/20/2022)   Overall Financial Resource Strain (CARDIA)    Difficulty of Paying Living Expenses: Not hard at all  Food Insecurity: No Food Insecurity (05/20/2022)   Hunger Vital Sign    Worried About Running Out of Food in the Last Year: Never true    [place] in the Last Year: Never true  Transportation Needs: No Transportation Needs (05/20/2022)   PRAPARE - Hydrologist (Medical): No    Lack of Transportation (Non-Medical): No  Physical Activity: Insufficiently Active (05/20/2022)   Exercise Vital Sign    Days of Exercise per Week: 2 days    Minutes of Exercise per Session: 20 min  Stress: No Stress Concern Present (05/20/2022)   [place]    Feeling of Stress : Not at all  Social Connections: Moderately Isolated (05/20/2022)   Social Connection and Isolation Panel [NHANES]    Frequency of Communication with Friends and Family: More than three times a week    Frequency of Social Gatherings with Friends and Family: More than three times a week    Attends Religious Services: More than 4 times per year    Active Member of [employer] or Organizations: No    Attends Archivist Meetings: Never    Marital Status: Widowed  Intimate Partner Violence: Not At Risk (05/20/2022)   Humiliation, Afraid, Rape, and Kick questionnaire    Fear of Current or Ex-Partner: No    Emotionally Abused: No    Physically Abused: No    Sexually Abused: No     Family History  Problem Relation Age of Onset   Ovarian cancer Other    Diabetes Sister    Colon cancer Brother    Diabetes  Brother    Atrial fibrillation Daughter    Breast cancer Neg Hx      Current Outpatient Medications:    albuterol (VENTOLIN HFA) 108 (90 Base) MCG/ACT inhaler, INHALE 2 PUFFS INTO THE LUNGS EVERY 4 HOURS AS NEEDED FOR WHEEZING ORSHORTNESS OF BREATH, Disp: 8.5 g, Rfl: 10   aspirin 325 MG tablet, Take 325 mg by mouth daily. Reported on 01/05/2016, Disp: , Rfl:    atorvastatin (LIPITOR) 10 MG tablet, TAKE 1 TABLET BY MOUTH ONCE DAILY., Disp: 90 tablet, Rfl: 1   carvedilol (COREG) 6.25 MG tablet, Take 1 tablet (6.25 mg total) by mouth 2 (two) times daily with  a meal., Disp: 60 tablet, Rfl: 0   cefUROXime (CEFTIN) 250 MG tablet, Take 1 tablet (250 mg total) by mouth 2 (two) times daily with a meal for 6 days., Disp: 12 tablet, Rfl: 0   clopidogrel (PLAVIX) 75 MG tablet, TAKE 1 TABLET BY MOUTH DAILY, Disp: 30 tablet, Rfl: 3   cyanocobalamin 1000 MCG tablet, Take 1 tablet (1,000 mcg total) by mouth daily., Disp: 30 tablet, Rfl: 2   Dupilumab (DUPIXENT) 300 MG/2ML SOPN, Inject 300 mg into the skin every 14 (fourteen) days. Starting at day 15 for maintenance., Disp: 4 mL, Rfl: 5   ezetimibe (ZETIA) 10 MG tablet, TAKE 1 TABLET BY MOUTH DAILY, Disp: 90 tablet, Rfl: 1   fexofenadine (ALLEGRA) 180 MG tablet, Take 180 mg by mouth daily., Disp: , Rfl:    FLUoxetine (PROZAC) 10 MG capsule, TAKE 1 CAPSULE BY MOUTH 3 TIMES DAILY., Disp: 90 capsule, Rfl: 3   glucose blood test strip, ACCU-CHEK AVIVA PLUS TEST STRP Use to check sugars 3 times daily., Disp: , Rfl:    HUMALOG KWIKPEN 100 UNIT/ML KwikPen, Inject 6 Units into the skin 3 (three) times daily. (Patient taking differently: Inject 12 Units into the skin 3 (three) times daily.), Disp: 15 mL, Rfl: 11   insulin glargine (LANTUS) 100 UNIT/ML injection, Inject 0.12 mLs (12 Units total) into the skin at bedtime., Disp: 10 mL, Rfl: 11   iron polysaccharides (NIFEREX) 150 MG capsule, Take 1 capsule (150 mg total) by mouth daily., Disp: 30 capsule, Rfl: 3    isosorbide mononitrate (IMDUR) 30 MG 24 hr tablet, Take 30 mg by mouth daily., Disp: , Rfl:    lansoprazole (PREVACID) 30 MG capsule, TAKE 1 CAPSULE BY MOUTH ONCE DAILY AT NOON (Patient taking differently: Take 30 mg by mouth in the morning.), Disp: 90 capsule, Rfl: 1   losartan (COZAAR) 100 MG tablet, TAKE 1 TABLET BY MOUTH DAILY, Disp: 90 tablet, Rfl: 1   memantine (NAMENDA) 10 MG tablet, Take 10 mg by mouth 2 (two) times daily., Disp: , Rfl:    QUEtiapine (SEROQUEL) 50 MG tablet, Take 50 mg by mouth at bedtime., Disp: , Rfl:    vitamin E 1000 UNIT capsule, Take 1,000 Units by mouth daily., Disp: , Rfl:    Physical exam:  Vitals:   06/21/22 1341  Temp: 97.7 F (36.5 C)  Weight: 139 lb 9.6 oz (63.3 kg)   Physical Exam Constitutional:      General: She is not in acute distress.    Comments: Ambulates with a walker  Cardiovascular:     Rate and Rhythm: Normal rate and regular rhythm.     Heart sounds: Normal heart sounds.  Pulmonary:     Effort: Pulmonary effort is normal.     Breath sounds: Normal breath sounds.  Abdominal:     General: Bowel sounds are normal.     Palpations: Abdomen is soft.  Skin:    General: Skin is warm and dry.  Neurological:     Mental Status: She is alert and oriented to person, place, and time.           Latest Ref Rng & Units 06/18/2022    7:39 AM  CMP  Glucose 70 - 99 mg/dL 103   BUN 8 - 23 mg/dL 36   Creatinine 0.44 - 1.00 mg/dL 1.07   Sodium 135 - 145 mmol/L 139   Potassium 3.5 - 5.1 mmol/L 3.4   Chloride 98 - 111 mmol/L 102   CO2  22 - 32 mmol/L 29   Calcium 8.9 - 10.3 mg/dL 8.4       Latest Ref Rng & Units 06/17/2022    7:20 AM  CBC  WBC 4.0 - 10.5 K/uL 10.1   Hemoglobin 12.0 - 15.0 g/dL 7.9   Hematocrit 36.0 - 46.0 % 25.5   Platelets 150 - 400 K/uL 239     No images are attached to the encounter.  ECHOCARDIOGRAM COMPLETE  Result Date: 06/16/2022    ECHOCARDIOGRAM REPORT   Patient Name:   KEONDRA HAYDU Date of Exam:  06/15/2022 Medical Rec #:  [id number]            Height:       62.0 in Accession #:    [id number]           Weight:       136.7 lb Date of Birth:  [birth year]            BSA:          1.626 m Patient Age:    67 years             BP:           148/77 mmHg Patient Gender: F                    HR:           84 bpm. Exam Location:  [facility] Procedure: 2D Echo, Cardiac Doppler and Color Doppler Indications:     V95.63 Acute Diastolic CHF  History:         Patient has no prior history of Echocardiogram examinations.                  COPD, Arrythmias:Atrial Fibrillation; Risk                  Factors:Hypertension, Dyslipidemia and Diabetes.  Sonographer:     Cresenciano Lick RDCS Referring Phys:  [id number] Lequita Halt Diagnosing Phys: Nelva Bush MD IMPRESSIONS  1. Left ventricular ejection fraction, by estimation, is 45 to 50%. The left ventricle has mildly decreased function. The left ventricle demonstrates global hypokinesis. There is mild left ventricular hypertrophy. Left ventricular diastolic parameters are indeterminate.  2. Right ventricular systolic function is mildly reduced. The right ventricular size is normal. There is normal pulmonary artery systolic pressure.  3. Left atrial size was mildly dilated.  4. The mitral valve is degenerative. Mild mitral valve regurgitation.  5. The aortic valve is tricuspid. There is mild calcification of the aortic valve. There is mild thickening of the aortic valve. Aortic valve regurgitation is not visualized. Aortic valve sclerosis is present, with no evidence of aortic valve stenosis.  6. The inferior vena cava is normal in size with <50% respiratory variability, suggesting right atrial pressure of 8 mmHg. FINDINGS  Left Ventricle: Left ventricular ejection fraction, by estimation, is 45 to 50%. The left ventricle has mildly decreased function. The left ventricle demonstrates global hypokinesis. The left ventricular internal cavity size was normal in size. There is  mild  left ventricular hypertrophy. Left ventricular diastolic function could not be evaluated due to atrial fibrillation. Left ventricular diastolic parameters are indeterminate. Right Ventricle: The right ventricular size is normal. No increase in right ventricular wall thickness. Right ventricular systolic function is mildly reduced. There is normal pulmonary artery systolic pressure. The tricuspid regurgitant velocity is 2.18 m/s, and with an assumed right atrial pressure of 8 mmHg, the estimated right  ventricular systolic pressure is 35.3 mmHg. Left Atrium: Left atrial size was mildly dilated. Right Atrium: Right atrial size was normal in size. Pericardium: There is no evidence of pericardial effusion. Mitral Valve: The mitral valve is degenerative in appearance. There is mild thickening of the mitral valve leaflet(s). Mild mitral valve regurgitation. Tricuspid Valve: The tricuspid valve is normal in structure. Tricuspid valve regurgitation is trivial. Aortic Valve: The aortic valve is tricuspid. There is mild calcification of the aortic valve. There is mild thickening of the aortic valve. Aortic valve regurgitation is not visualized. Aortic valve sclerosis is present, with no evidence of aortic valve stenosis. Pulmonic Valve: The pulmonic valve was not well visualized. Pulmonic valve regurgitation is not visualized. No evidence of pulmonic stenosis. Aorta: The aortic root and ascending aorta are structurally normal, with no evidence of dilitation. Venous: The inferior vena cava is normal in size with less than 50% respiratory variability, suggesting right atrial pressure of 8 mmHg. IAS/Shunts: No atrial level shunt detected by color flow Doppler.  LEFT VENTRICLE PLAX 2D LVIDd:         4.30 cm LVIDs:         3.20 cm LV PW:         1.30 cm LV IVS:        1.20 cm LVOT diam:     2.00 cm LV SV:         36 LV SV Index:   22 LVOT Area:     3.14 cm  RIGHT VENTRICLE             IVC RV Basal diam:  3.50 cm     IVC diam: 1.50  cm RV S prime:     11.80 cm/s TAPSE (M-mode): 1.6 cm LEFT ATRIUM             Index        RIGHT ATRIUM           Index LA diam:        4.00 cm 2.46 cm/m   RA Area:     13.90 cm LA Vol (A2C):   81.8 ml 50.30 ml/m  RA Volume:   33.60 ml  20.66 ml/m LA Vol (A4C):   42.3 ml 26.01 ml/m LA Biplane Vol: 60.4 ml 37.14 ml/m  AORTIC VALVE LVOT Vmax:   69.58 cm/s LVOT Vmean:  49.240 cm/s LVOT VTI:    0.115 m  AORTA Ao Root diam: 3.30 cm Ao Asc diam:  3.50 cm MV E velocity: 149.00 cm/s  TRICUSPID VALVE                             TR Peak grad:   19.0 mmHg                             TR Vmax:        218.00 cm/s                              SHUNTS                             Systemic VTI:  0.12 m  Systemic Diam: 2.00 cm Nelva Bush MD Electronically signed by Nelva Bush MD Signature Date/Time: 06/16/2022/7:04:40 AM    Final    CT CHEST WO CONTRAST  Result Date: 06/15/2022 CLINICAL DATA:  Pneumonia, complication suspected, xray done EXAM: CT CHEST WITHOUT CONTRAST TECHNIQUE: Multidetector CT imaging of the chest was performed following the standard protocol without IV contrast. RADIATION DOSE REDUCTION: This exam was performed according to the departmental dose-optimization program which includes automated exposure control, adjustment of the mA and/or kV according to patient size and/or use of iterative reconstruction technique. COMPARISON:  Chest x-ray the same day.  CT of the chest 03/31/2020. FINDINGS: Cardiovascular: Mild cardiomegaly. Coronary artery and aortic atherosclerosis. No pericardial effusion. Mediastinum/Nodes: No enlarged mediastinal or axillary lymph nodes. Thyroid gland, trachea, and esophagus demonstrate no significant findings. Lungs/Pleura: Patchy consolidative and ground-glass opacities in all lobes of the lung. Slight interlobular septal thickening. Small bilateral pleural effusions. No pneumothorax. Upper Abdomen: No acute abnormality. Musculoskeletal: No  fracture is seen. IMPRESSION: 1. Patchy consolidative and ground-glass opacities in all lobes of the lung, most consistent with multifocal pneumonia. 2. Small bilateral pleural effusions and slight interlobular septal thickening, which may represent mild superimposed interstitial edema. 3. Aortic Atherosclerosis (ICD10-I70.0) and coronary artery atherosclerosis. 4. Mild cardiomegaly. Electronically Signed   By: Margaretha Sheffield M.D.   On: 06/15/2022 11:43   DG Chest Port 1 View  Result Date: 06/15/2022 CLINICAL DATA:  Worsening shortness of breath EXAM: PORTABLE CHEST 1 VIEW COMPARISON:  06/15/2022 FINDINGS: Chronic cardiomegaly. Interstitial coarsening with [employer], similar to prior and new from 2022. Indistinct airspace type density at the bases. No visible effusion or pneumothorax. Artifact from EKG leads. IMPRESSION: CHF pattern.  Cannot exclude superimposed pneumonia. Electronically Signed   By: Jorje Guild M.D.   On: 06/15/2022 06:22   DG Chest Portable 1 View  Result Date: 06/15/2022 CLINICAL DATA:  Shortness of breath onset 3 hours ago. Hypoxia with saturation 85%. EXAM: PORTABLE CHEST 1 VIEW COMPARISON:  Portable chest 02/09/2021 FINDINGS: The heart has enlarged compared to the prior study with development of vascular engorgement and flow cephalization and moderate diffuse interstitial edema with a basal gradient. There are patchy hazy opacities in the mid to lower lung fields which probably due to alveolar edema, less likely multifocal pneumonia, with scattered atelectatic bands in the lower zones. Small pleural effusions are beginning to form. There is aortic atherosclerosis with stable mediastinal configuration. There is thoracic spondylosis. IMPRESSION: Moderate CHF findings with vascular engorgement and flow cephalization and moderate interstitial edema. Additional opacities in the mid to lower lung fields most likely due to alveolar edema, less likely multifocal pneumonia, with small  pleural effusions. Progress chest films recommended depending on clinical response. Electronically Signed   By: Telford Nab M.D.   On: 06/15/2022 02:03    Assessment and plan- Patient is a 88 y.o. female referred for iron deficiency anemia  With regards to etiology of iron deficiency anemia ideally patient needs GI work-up including EGD colonoscopy and possible capsule endoscopy.  However patient is 88 years of age and endoscopy interventions may be risky.  Patient's son would like patient to be seen by [facility] and I will make a referral for the same.  I will plan to check stool H. pylori antigen.  I will plan to offer the patient IV iron at this time.  Depending on what her insurance approves we will either plan for Venofer x5 or Feraheme x2.  Discussed risks and benefits of IV  iron including all but not limited to possible risk of infusion reaction.  Patient understands and agrees to proceed as planned.  I will also give her B12 injections along with IV iron as her B12 level is mildly low at 249.  I will repeat CBC ferritin and iron studies and B12 levels in 2 months and see her thereafter  Thank you for this kind referral and the opportunity to participate in the care of this patient   Visit Diagnosis 1. Iron deficiency anemia, unspecified iron deficiency anemia type   2. B12 deficiency     Dr. Randa Evens, MD, MPH [facility] [phone] 06/21/2022

## 2022-06-21 NOTE — Patient Instructions (Addendum)
Continue weighing daily and call for an overnight weight gain of 3 pounds or more or a weekly weight gain of more than 5 pounds.   If you have voicemail, please make sure your mailbox is cleaned out so that we may leave a message and please make sure to listen to any voicemails.    [facility] [place], [place] 07/22/22 @ 330pm Jettie Booze

## 2022-06-22 ENCOUNTER — Telehealth (HOSPITAL_COMMUNITY): Payer: Self-pay | Admitting: Surgery

## 2022-06-22 ENCOUNTER — Encounter: Payer: Self-pay | Admitting: Emergency Medicine

## 2022-06-22 ENCOUNTER — Telehealth: Payer: Self-pay

## 2022-06-22 ENCOUNTER — Other Ambulatory Visit: Payer: Self-pay

## 2022-06-22 ENCOUNTER — Emergency Department: Payer: Medicare Other

## 2022-06-22 DIAGNOSIS — J441 Chronic obstructive pulmonary disease with (acute) exacerbation: Secondary | ICD-10-CM | POA: Diagnosis not present

## 2022-06-22 DIAGNOSIS — N1831 Chronic kidney disease, stage 3a: Secondary | ICD-10-CM | POA: Diagnosis not present

## 2022-06-22 DIAGNOSIS — I110 Hypertensive heart disease with heart failure: Secondary | ICD-10-CM | POA: Diagnosis not present

## 2022-06-22 DIAGNOSIS — I509 Heart failure, unspecified: Secondary | ICD-10-CM | POA: Insufficient documentation

## 2022-06-22 DIAGNOSIS — R350 Frequency of micturition: Secondary | ICD-10-CM | POA: Insufficient documentation

## 2022-06-22 DIAGNOSIS — Z7951 Long term (current) use of inhaled steroids: Secondary | ICD-10-CM | POA: Diagnosis not present

## 2022-06-22 DIAGNOSIS — Z20822 Contact with and (suspected) exposure to covid-19: Secondary | ICD-10-CM | POA: Diagnosis not present

## 2022-06-22 DIAGNOSIS — D631 Anemia in chronic kidney disease: Secondary | ICD-10-CM | POA: Diagnosis not present

## 2022-06-22 DIAGNOSIS — I130 Hypertensive heart and chronic kidney disease with heart failure and stage 1 through stage 4 chronic kidney disease, or unspecified chronic kidney disease: Secondary | ICD-10-CM | POA: Diagnosis not present

## 2022-06-22 DIAGNOSIS — E1122 Type 2 diabetes mellitus with diabetic chronic kidney disease: Secondary | ICD-10-CM | POA: Diagnosis not present

## 2022-06-22 DIAGNOSIS — R0602 Shortness of breath: Secondary | ICD-10-CM | POA: Diagnosis present

## 2022-06-22 DIAGNOSIS — D72829 Elevated white blood cell count, unspecified: Secondary | ICD-10-CM | POA: Insufficient documentation

## 2022-06-22 DIAGNOSIS — I5033 Acute on chronic diastolic (congestive) heart failure: Secondary | ICD-10-CM | POA: Diagnosis not present

## 2022-06-22 LAB — BASIC METABOLIC PANEL
Anion gap: 12 (ref 5–15)
BUN: 27 mg/dL — ABNORMAL HIGH (ref 8–23)
CO2: 24 mmol/L (ref 22–32)
Calcium: 8.5 mg/dL — ABNORMAL LOW (ref 8.9–10.3)
Chloride: 102 mmol/L (ref 98–111)
Creatinine, Ser: 1.32 mg/dL — ABNORMAL HIGH (ref 0.44–1.00)
GFR, Estimated: 38 mL/min — ABNORMAL LOW (ref >60)
Glucose, Bld: 137 mg/dL — ABNORMAL HIGH (ref 70–99)
Potassium: 5 mmol/L (ref 3.5–5.1)
Sodium: 138 mmol/L (ref 135–145)

## 2022-06-22 LAB — CBC
HCT: 30.1 % — ABNORMAL LOW (ref 36.0–46.0)
Hemoglobin: 8.9 g/dL — ABNORMAL LOW (ref 12.0–15.0)
MCH: 22.5 pg — ABNORMAL LOW (ref 26.0–34.0)
MCHC: 29.6 g/dL — ABNORMAL LOW (ref 30.0–36.0)
MCV: 76.2 fL — ABNORMAL LOW (ref 80.0–100.0)
Platelets: 354 10*3/uL (ref 150–400)
RBC: 3.95 MIL/uL (ref 3.87–5.11)
RDW: 19.7 % — ABNORMAL HIGH (ref 11.5–15.5)
WBC: 9.5 10*3/uL (ref 4.0–10.5)
nRBC: 0 % (ref 0.0–0.2)

## 2022-06-22 LAB — TROPONIN I (HIGH SENSITIVITY): Troponin I (High Sensitivity): 38 ng/L — ABNORMAL HIGH (ref <18)

## 2022-06-22 LAB — SARS CORONAVIRUS 2 BY RT PCR: SARS Coronavirus 2 by RT PCR: NEGATIVE

## 2022-06-22 LAB — BRAIN NATRIURETIC PEPTIDE: B Natriuretic Peptide: 762.8 pg/mL — ABNORMAL HIGH (ref 0.0–100.0)

## 2022-06-22 NOTE — ED Provider Triage Note (Signed)
Emergency Medicine Provider Triage Evaluation Note  [name] , a 88 y.o. female  was evaluated in triage.  Pt complains of shortness of breath.  She was discharged to the hospital on Friday for admission secondary to bilateral CAP.  Presents today after noting low oxygen sats in the 80s following a visit by her home health nurse and her physical therapy tech.  Review of Systems  Positive: SOB, urinary retention Negative: FCS  Physical Exam  BP (!) 171/64 (BP Location: Right Arm)   Pulse 75   Temp 98.1 F (36.7 C) (Oral)   Resp 18   Ht '5\' 3"'$  (1.6 m)   Wt 63.3 kg   SpO2 96%   BMI 24.72 kg/m  Gen:   Awake, no distress  NAD Resp:  Normal effort CTA MSK:   Moves extremities without difficulty  CVS:  RRR  Medical Decision Making  Medically screening exam initiated at 9:59 PM.  Appropriate orders placed.  [name] was informed that the remainder of the evaluation will be completed by another provider, this initial triage assessment does not replace that evaluation, and the importance of remaining in the ED until their evaluation is complete.  Geriatric patient to the ED for evaluation of shortness of breath and urinary retention.  Patient was recently admitted for bilateral CAP.   Melvenia Needles, PA-C 06/22/22 2200

## 2022-06-22 NOTE — ED Triage Notes (Signed)
Pt was recently discharged from hospital for pneumonia. Pt states today feeling SOB and called 911 and when EMS arrived o2 was 87% RA.

## 2022-06-22 NOTE — Telephone Encounter (Signed)
I received request to provide patient with talking scale for home use.  I have ordered and scale will be delivered to patient home.

## 2022-06-22 NOTE — Telephone Encounter (Signed)
Verbal orders given toShireen Sabra Heck with [facility]

## 2022-06-22 NOTE — Telephone Encounter (Signed)
Copied from [place] #[id number]. Topic: General - Other >> Jun 22, 2022  4:12 PM Ludger Nutting wrote: [place] Orders - Caller/Agency: Pablo Pena Number: [phone] Requesting OT/PT/Skilled Nursing/Social Work/Speech Therapy: OT Frequency: 1w6

## 2022-06-22 NOTE — ED Triage Notes (Signed)
Pt states feels like she needs to urinate but is unable to and feels like she may be full of fluid.

## 2022-06-23 ENCOUNTER — Other Ambulatory Visit: Payer: Medicare Other

## 2022-06-23 ENCOUNTER — Telehealth: Payer: Self-pay

## 2022-06-23 ENCOUNTER — Emergency Department
Admission: EM | Admit: 2022-06-23 | Discharge: 2022-06-23 | Disposition: A | Discharge status: Home / Self Care | Payer: Medicare Other | Attending: Emergency Medicine | Admitting: Emergency Medicine

## 2022-06-23 ENCOUNTER — Ambulatory Visit: Payer: Medicare Other | Admitting: Oncology

## 2022-06-23 ENCOUNTER — Ambulatory Visit: Payer: Medicare Other | Admitting: Family

## 2022-06-23 DIAGNOSIS — R350 Frequency of micturition: Secondary | ICD-10-CM

## 2022-06-23 DIAGNOSIS — R0600 Dyspnea, unspecified: Secondary | ICD-10-CM

## 2022-06-23 DIAGNOSIS — I509 Heart failure, unspecified: Secondary | ICD-10-CM

## 2022-06-23 DIAGNOSIS — J441 Chronic obstructive pulmonary disease with (acute) exacerbation: Secondary | ICD-10-CM

## 2022-06-23 LAB — TROPONIN I (HIGH SENSITIVITY): Troponin I (High Sensitivity): 34 ng/L — ABNORMAL HIGH (ref <18)

## 2022-06-23 LAB — URINALYSIS, ROUTINE W REFLEX MICROSCOPIC
Bilirubin Urine: NEGATIVE
Glucose, UA: NEGATIVE mg/dL
Hgb urine dipstick: NEGATIVE
Ketones, ur: 5 mg/dL — AB
Nitrite: NEGATIVE
Protein, ur: 100 mg/dL — AB
Specific Gravity, Urine: 1.012 (ref 1.005–1.030)
pH: 6 (ref 5.0–8.0)

## 2022-06-23 MED ORDER — IPRATROPIUM-ALBUTEROL 0.5-2.5 (3) MG/3ML IN SOLN
3.0000 mL | RESPIRATORY_TRACT | Status: AC
  Administered 2022-06-23: 3 mL via RESPIRATORY_TRACT
  Filled 2022-06-23: qty 3

## 2022-06-23 MED ORDER — FUROSEMIDE 10 MG/ML IJ SOLN
40.0000 mg | Freq: Once | INTRAMUSCULAR | Status: AC
  Administered 2022-06-23: 40 mg via INTRAVENOUS
  Filled 2022-06-23: qty 4

## 2022-06-23 MED ORDER — PREDNISONE 50 MG PO TABS: 50.0000 mg | ORAL_TABLET | Freq: Every day | ORAL | 0 refills | Status: AC

## 2022-06-23 MED ORDER — PREDNISONE 20 MG PO TABS
60.0000 mg | ORAL_TABLET | Freq: Once | ORAL | Status: AC
  Administered 2022-06-23: 60 mg via ORAL
  Filled 2022-06-23: qty 3

## 2022-06-23 MED ORDER — SODIUM CHLORIDE 0.9 % IV SOLN
1.0000 g | Freq: Once | INTRAVENOUS | Status: AC
  Administered 2022-06-23: 1 g via INTRAVENOUS

## 2022-06-23 MED ORDER — CEPHALEXIN 500 MG PO CAPS: 500.0000 mg | ORAL_CAPSULE | Freq: Two times a day (BID) | ORAL | 0 refills | Status: AC

## 2022-06-23 NOTE — Discharge Instructions (Signed)
Take insulin for 3 days as prescribed.  Use your inhaler 2 puffs every 4 hours as needed for shortness of breath.  Take antibiotics for urine infection.  Talk to your doctor this week about whether or not you should be on an increased dose of your diuretic  Thank you for choosing [place] for your health care today!  Please see your primary doctor this week for a follow up appointment.   If you do not have a primary doctor call the following clinics to establish care:  If you have insurance:  [facility] [id number] [place] [address]   [facility]  [phone] [address] [place] [address]   If you do not have insurance:  [facility]  [phone] [address], [place] [address]  Sometimes, in the early stages of certain disease courses it is difficult to detect in the emergency department evaluation -- so, it is important that you continue to monitor your symptoms and call your doctor right away or return to the emergency department if you develop any new or worsening symptoms.  It was my pleasure to care for you today.   Hoover Brunette Jacelyn Grip, MD

## 2022-06-23 NOTE — ED Provider Notes (Signed)
[facility] Provider Note    Event Date/Time   First MD Initiated Contact with Patient 06/23/22 0134     (approximate)   History   Shortness of Breath and Urinary Frequency   HPI  [name] is a 88 y.o. female   Past medical history of ecent hospitalization for pna and fluid overload and history of CHF and COPD who presents to the emergency department with a episode of dyspnea this morning and reported hypoxia 85% on room air the complete liter resolved after self administering albuterol.  She was advised by EMS to come to the emergency department for further evaluation.  She denies chest pain.  No preceding illnesses including cough, fever.  She also states that she has had less urine output and has experienced some mild urinary frequency since being discharged from the hospital  No complaints now, symptoms have completely resolved.  History was obtained via patient and a review of external medical notes including discharge summary from recent hospitalization in September 2023      Physical Exam   Triage Vital Signs: ED Triage Vitals  Enc Vitals Group     BP 06/22/22 1649 (!) 148/70     Pulse Rate 06/22/22 1649 74     Resp 06/22/22 1649 16     Temp 06/22/22 1654 98 F (36.7 C)     Temp Source 06/22/22 1654 Oral     SpO2 06/22/22 1649 98 %     Weight 06/22/22 1649 139 lb 8.8 oz (63.3 kg)     Height 06/22/22 1649 '5\' 3"'$  (1.6 m)     Head Circumference --      Peak Flow --      Pain Score 06/22/22 1649 7     Pain Loc --      Pain Edu? --      Excl. in [place]? --     Most recent vital signs: Vitals:   06/22/22 2055 06/23/22 0209  BP: (!) 171/64 (!) 199/88  Pulse: 75 80  Resp: 18 16  Temp: 98.1 F (36.7 C) 98 F (36.7 C)  SpO2: 96% 97%    General: Awake, no distress.  CV:  Good peripheral perfusion.  Resp:  Normal effort.  No wheezing or focality Abd:  No distention.  Nontender Other:  Oxygen saturations 97% on room  air   ED Results / Procedures / Treatments   Labs (all labs ordered are listed, but only abnormal results are displayed) Labs Reviewed  URINALYSIS, ROUTINE W REFLEX MICROSCOPIC - Abnormal; Notable for the following components:      Result Value   Color, Urine STRAW (*)    APPearance CLEAR (*)    Ketones, ur 5 (*)    Protein, ur 100 (*)    Leukocytes,Ua TRACE (*)    Bacteria, UA RARE (*)    All other components within normal limits  BASIC METABOLIC PANEL - Abnormal; Notable for the following components:   Glucose, Bld 137 (*)    BUN 27 (*)    Creatinine, Ser 1.32 (*)    Calcium 8.5 (*)    GFR, Estimated 38 (*)    All other components within normal limits  CBC - Abnormal; Notable for the following components:   Hemoglobin 8.9 (*)    HCT 30.1 (*)    MCV 76.2 (*)    MCH 22.5 (*)    MCHC 29.6 (*)    RDW 19.7 (*)    All other components  within normal limits  BRAIN NATRIURETIC PEPTIDE - Abnormal; Notable for the following components:   B Natriuretic Peptide 762.8 (*)    All other components within normal limits  TROPONIN I (HIGH SENSITIVITY) - Abnormal; Notable for the following components:   Troponin I (High Sensitivity) 38 (*)    All other components within normal limits  TROPONIN I (HIGH SENSITIVITY) - Abnormal; Notable for the following components:   Troponin I (High Sensitivity) 34 (*)    All other components within normal limits  SARS CORONAVIRUS 2 BY RT PCR  URINE CULTURE     I reviewed labs and they are notable for anemia hemoglobin 8.9, at baseline, proBNP is elevated at 700 and negative COVID test.  Troponin flat 38-34  EKG  ED ECG REPORT I, Lucillie Garfinkel, the attending physician, personally viewed and interpreted this ECG.   Date: 06/23/2022  EKG Time: 2157  Rate: 80  Rhythm: normal sinus rhythm  Axis: normal  Intervals:no ischemic changes    RADIOLOGY I independently reviewed and interpreted this x-ray and see diffuse bilateral infiltrates consistent  with mild pulmonary edema   PROCEDURES:  Critical Care performed: No  Procedures   MEDICATIONS ORDERED IN ED: Medications  ipratropium-albuterol (DUONEB) 0.5-2.5 (3) MG/3ML nebulizer solution 3 mL ( Nebulization Canceled Entry 06/23/22 0427)  cefTRIAXone (ROCEPHIN) 1 g in sodium chloride 0.9 % 100 mL IVPB (has no administration in time range)  predniSONE (DELTASONE) tablet 60 mg (60 mg Oral Given 06/23/22 0232)  furosemide (LASIX) injection 40 mg (40 mg Intravenous Given 06/23/22 0233)     IMPRESSION / MDM / ASSESSMENT AND PLAN / ED COURSE  I reviewed the triage vital signs and the nursing notes.                              Differential diagnosis includes, but is not limited to, CHF exacerbation, COPD exacerbation, respiratory infection, considered but less likely ACS or pulmonary embolism   The patient is on the cardiac monitor to evaluate for evidence of arrhythmia and/or significant heart rate changes.  MDM: Patient with evidence of fluid overload with proBNP elevated, evidence of pulmonary edema on chest x-ray and recent hospitalization for fluid overload requiring diuresis but no home Lasix on her med list.  We will treat with Lasix today.  Also evidence of COPD exacerbation with a history of COPD and symptoms very responsive to her home albuterol treatment.  Will give additional DuoNeb's here in a short burst of prednisone.  Check urinalysis for urinary frequency.  urinalysis with leukocytes and rare bacteria, will treat for urine infection and send a culture  Remains stable on room air with no more dyspnea after treatment for CHF and COPD exacerbation as above.   Dispo: After careful consideration of this patient's presentation, medical and social risk factors, and evaluation in the emergency department I engaged in shared decision making with the patient and/or their representative to consider admission or observation and this patient was ultimately discharged because  complete resolution of symptoms and plan for prednisone burst for COPD exacerbation close PMD follow-up.   Patient's presentation is most consistent with acute presentation with potential threat to life or bodily function.       FINAL CLINICAL IMPRESSION(S) / ED DIAGNOSES   Final diagnoses:  COPD exacerbation ([place])  Dyspnea, unspecified type  Acute on chronic congestive heart failure, unspecified heart failure type ([place])  Urinary frequency     Rx /  DC Orders   ED Discharge Orders          Ordered    predniSONE (DELTASONE) 50 MG tablet  Daily        06/23/22 0458    cephALEXin (KEFLEX) 500 MG capsule  2 times daily        06/23/22 0458             Note:  This document was prepared using Dragon voice recognition software and may include unintentional dictation errors.    Lucillie Garfinkel, MD 06/23/22 [phone]

## 2022-06-24 LAB — URINE CULTURE

## 2022-06-25 ENCOUNTER — Telehealth: Payer: Self-pay | Admitting: Family Medicine

## 2022-06-25 NOTE — Telephone Encounter (Signed)
Verbal orders given to Amy with [facility]

## 2022-06-25 NOTE — Telephone Encounter (Signed)
Home Health Verbal Orders - Caller/Agency: Amy/[facility] Callback Number: [phone] Requesting  PT Therapy:  Frequency: 1 wk 5  FYI: Pt went to ED Tuesday night and dx UTI. Went for SOB.  Started her on abx

## 2022-06-25 NOTE — Telephone Encounter (Signed)
Ok for verbal orders.    Trenesha Alcaide Simmons-Robinson, MD  [facility]  

## 2022-06-25 NOTE — Telephone Encounter (Signed)
Please advise 

## 2022-06-26 DIAGNOSIS — I130 Hypertensive heart and chronic kidney disease with heart failure and stage 1 through stage 4 chronic kidney disease, or unspecified chronic kidney disease: Secondary | ICD-10-CM | POA: Diagnosis not present

## 2022-06-26 DIAGNOSIS — N1831 Chronic kidney disease, stage 3a: Secondary | ICD-10-CM | POA: Diagnosis not present

## 2022-06-26 DIAGNOSIS — E1122 Type 2 diabetes mellitus with diabetic chronic kidney disease: Secondary | ICD-10-CM | POA: Diagnosis not present

## 2022-06-26 DIAGNOSIS — D631 Anemia in chronic kidney disease: Secondary | ICD-10-CM | POA: Diagnosis not present

## 2022-06-26 DIAGNOSIS — I5033 Acute on chronic diastolic (congestive) heart failure: Secondary | ICD-10-CM | POA: Diagnosis not present

## 2022-06-29 ENCOUNTER — Inpatient Hospital Stay: Payer: Medicare Other | Attending: Oncology

## 2022-06-29 ENCOUNTER — Other Ambulatory Visit: Payer: Self-pay | Admitting: *Deleted

## 2022-06-29 VITALS — BP 167/68 | HR 72 | Temp 98.0°F | Resp 18

## 2022-06-29 DIAGNOSIS — D509 Iron deficiency anemia, unspecified: Secondary | ICD-10-CM | POA: Diagnosis not present

## 2022-06-29 DIAGNOSIS — E538 Deficiency of other specified B group vitamins: Secondary | ICD-10-CM | POA: Diagnosis present

## 2022-06-29 MED ORDER — SODIUM CHLORIDE 0.9 % IV SOLN
Freq: Once | INTRAVENOUS | Status: AC
  Filled 2022-06-29: qty 250

## 2022-06-29 MED ORDER — CYANOCOBALAMIN 1000 MCG/ML IJ SOLN
1000.0000 ug | Freq: Once | INTRAMUSCULAR | Status: AC
  Administered 2022-06-29: 1000 ug via INTRAMUSCULAR
  Filled 2022-06-29: qty 1

## 2022-06-29 MED ORDER — SODIUM CHLORIDE 0.9 % IV SOLN
510.0000 mg | INTRAVENOUS | Status: DC
  Administered 2022-06-29: 510 mg via INTRAVENOUS
  Filled 2022-06-29: qty 510

## 2022-06-29 NOTE — Progress Notes (Signed)
Made lab orders

## 2022-06-29 NOTE — Patient Instructions (Addendum)
[facility] CANCER CTR AT [place]  Discharge Instructions: Thank you for choosing [facility] to provide your oncology and hematology care.  If you have a lab appointment with the [facility], please go directly to the [facility] and check in at the registration area.  Wear comfortable clothing and clothing appropriate for easy access to any Portacath or PICC line.   We strive to give you quality time with your provider. You may need to reschedule your appointment if you arrive late (15 or more minutes).  Arriving late affects you and other patients whose appointments are after yours.  Also, if you miss three or more appointments without notifying the office, you may be dismissed from the clinic at the provider's discretion.      For prescription refill requests, have your pharmacy contact our office and allow 72 hours for refills to be completed.    Today you received the following chemotherapy and/or immunotherapy agents Feraheme and B12 Shot.      To help prevent nausea and vomiting after your treatment, we encourage you to take your nausea medication as directed.  BELOW ARE SYMPTOMS THAT SHOULD BE REPORTED IMMEDIATELY: *FEVER GREATER THAN 100.4 F (38 C) OR HIGHER *CHILLS OR SWEATING *NAUSEA AND VOMITING THAT IS NOT CONTROLLED WITH YOUR NAUSEA MEDICATION *UNUSUAL SHORTNESS OF BREATH *UNUSUAL BRUISING OR BLEEDING *URINARY PROBLEMS (pain or burning when urinating, or frequent urination) *BOWEL PROBLEMS (unusual diarrhea, constipation, pain near the anus) TENDERNESS IN MOUTH AND THROAT WITH OR WITHOUT PRESENCE OF ULCERS (sore throat, sores in mouth, or a toothache) UNUSUAL RASH, SWELLING OR PAIN  UNUSUAL VAGINAL DISCHARGE OR ITCHING   Items with * indicate a potential emergency and should be followed up as soon as possible or go to the Emergency Department if any problems should occur.  Please show the CHEMOTHERAPY ALERT CARD or IMMUNOTHERAPY ALERT CARD at  check-in to the Emergency Department and triage nurse.  Should you have questions after your visit or need to cancel or reschedule your appointment, please contact [facility] CANCER [place] AT [place]  [phone] and follow the prompts.  Office hours are 8:00 a.m. to 4:30 p.m. Monday - Friday. Please note that voicemails left after 4:00 p.m. may not be returned until the following business day.  We are closed weekends and major holidays. You have access to a nurse at all times for urgent questions. Please call the main number to the clinic [phone] and follow the prompts.  For any non-urgent questions, you may also contact your provider using MyChart. We now offer e-Visits for anyone 23 and older to request care online for non-urgent symptoms. For details visit [email or web address].   Also download the MyChart app! Go to the app store, search "MyChart", open the app, select [facility], and log in with your MyChart username and password.  Masks are optional in the cancer centers. If you would like for your care team to wear a mask while they are taking care of you, please let them know. For doctor visits, patients may have with them one support person who is at least 86 years old. At this time, visitors are not allowed in the infusion area.  Ferumoxytol Injection What is this medication? FERUMOXYTOL (FER ue MOX i tol) treats low levels of iron in your body (iron deficiency anemia). Iron is a mineral that plays an important role in making red blood cells, which carry oxygen from your lungs to the rest of your body. This medicine may be  used for other purposes; ask your health care provider or pharmacist if you have questions. COMMON BRAND NAME(S): Feraheme What should I tell my care team before I take this medication? They need to know if you have any of these conditions: Anemia not caused by low iron levels High levels of iron in the blood Magnetic resonance imaging (MRI) test  scheduled An unusual or allergic reaction to iron, other medications, foods, dyes, or preservatives Pregnant or trying to get pregnant Breast-feeding How should I use this medication? This medication is for injection into a vein. It is given in a hospital or clinic setting. Talk to your care team the use of this medication in children. Special care may be needed. Overdosage: If you think you have taken too much of this medicine contact a poison control center or emergency room at once. NOTE: This medicine is only for you. Do not share this medicine with others. What if I miss a dose? It is important not to miss your dose. Call your care team if you are unable to keep an appointment. What may interact with this medication? Other iron products This list may not describe all possible interactions. Give your health care provider a list of all the medicines, herbs, non-prescription drugs, or dietary supplements you use. Also tell them if you smoke, drink alcohol, or use illegal drugs. Some items may interact with your medicine. What should I watch for while using this medication? Visit your care team regularly. Tell your care team if your symptoms do not start to get better or if they get worse. You may need blood work done while you are taking this medication. You may need to follow a special diet. Talk to your care team. Foods that contain iron include: whole grains/cereals, dried fruits, beans, or peas, leafy green vegetables, and organ meats (liver, kidney). What side effects may I notice from receiving this medication? Side effects that you should report to your care team as soon as possible: Allergic reactions--skin rash, itching, hives, swelling of the face, lips, tongue, or throat Low blood pressure--dizziness, feeling faint or lightheaded, blurry vision Shortness of breath Side effects that usually do not require medical attention (report to your care team if they continue or are  bothersome): Flushing Headache Joint pain Muscle pain Nausea Pain, redness, or irritation at injection site This list may not describe all possible side effects. Call your doctor for medical advice about side effects. You may report side effects to FDA at 1-800-FDA-1088. Where should I keep my medication? This medication is given in a hospital or clinic and will not be stored at home. NOTE: This sheet is a summary. It may not cover all possible information. If you have questions about this medicine, talk to your doctor, pharmacist, or health care provider.  2023 Elsevier/Gold Standard (2021-02-06 00:00:00)  Vitamin B12 Injection What is this medication? Vitamin B12 (VAHY tuh min B12) prevents and treats low vitamin B12 levels in your body. It is used in people who do not get enough vitamin B12 from their diet or when their digestive tract does not absorb enough. Vitamin B12 plays an important role in maintaining the health of your nervous system and red blood cells. This medicine may be used for other purposes; ask your health care provider or pharmacist if you have questions. COMMON BRAND NAME(S): B-12 Compliance Kit, B-12 Injection Kit, Cyomin, Dodex, LA-12, Nutri-Twelve, Physicians EZ Use B-12, Primabalt What should I tell my care team before I take this medication?  They need to know if you have any of these conditions: Kidney disease Leber's disease Megaloblastic anemia An unusual or allergic reaction to cyanocobalamin, cobalt, other medications, foods, dyes, or preservatives Pregnant or trying to get pregnant Breast-feeding How should I use this medication? This medication is injected into a muscle or deeply under the skin. It is usually given in a clinic or care team's office. However, your care team may teach you how to inject yourself. Follow all instructions. Talk to your care team about the use of this medication in children. Special care may be needed. Overdosage: If you  think you have taken too much of this medicine contact a poison control center or emergency room at once. NOTE: This medicine is only for you. Do not share this medicine with others. What if I miss a dose? If you are given your dose at a clinic or care team's office, call to reschedule your appointment. If you give your own injections, and you miss a dose, take it as soon as you can. If it is almost time for your next dose, take only that dose. Do not take double or extra doses. What may interact with this medication? Alcohol Colchicine This list may not describe all possible interactions. Give your health care provider a list of all the medicines, herbs, non-prescription drugs, or dietary supplements you use. Also tell them if you smoke, drink alcohol, or use illegal drugs. Some items may interact with your medicine. What should I watch for while using this medication? Visit your care team regularly. You may need blood work done while you are taking this medication. You may need to follow a special diet. Talk to your care team. Limit your alcohol intake and avoid smoking to get the best benefit. What side effects may I notice from receiving this medication? Side effects that you should report to your care team as soon as possible: Allergic reactions--skin rash, itching, hives, swelling of the face, lips, tongue, or throat Swelling of the ankles, hands, or feet Trouble breathing Side effects that usually do not require medical attention (report to your care team if they continue or are bothersome): Diarrhea This list may not describe all possible side effects. Call your doctor for medical advice about side effects. You may report side effects to FDA at 1-800-FDA-1088. Where should I keep my medication? Keep out of the reach of children. Store at room temperature between 15 and 30 degrees C (59 and 85 degrees F). Protect from light. Throw away any unused medication after the expiration  date. NOTE: This sheet is a summary. It may not cover all possible information. If you have questions about this medicine, talk to your doctor, pharmacist, or health care provider.  2023 Elsevier/Gold Standard (2020-11-20 00:00:00)

## 2022-06-30 DIAGNOSIS — D509 Iron deficiency anemia, unspecified: Secondary | ICD-10-CM | POA: Diagnosis not present

## 2022-07-02 DIAGNOSIS — J440 Chronic obstructive pulmonary disease with acute lower respiratory infection: Secondary | ICD-10-CM

## 2022-07-02 DIAGNOSIS — N1831 Chronic kidney disease, stage 3a: Secondary | ICD-10-CM | POA: Diagnosis not present

## 2022-07-02 DIAGNOSIS — I130 Hypertensive heart and chronic kidney disease with heart failure and stage 1 through stage 4 chronic kidney disease, or unspecified chronic kidney disease: Secondary | ICD-10-CM | POA: Diagnosis not present

## 2022-07-02 DIAGNOSIS — I5033 Acute on chronic diastolic (congestive) heart failure: Secondary | ICD-10-CM | POA: Diagnosis not present

## 2022-07-02 DIAGNOSIS — E1122 Type 2 diabetes mellitus with diabetic chronic kidney disease: Secondary | ICD-10-CM | POA: Diagnosis not present

## 2022-07-02 DIAGNOSIS — I2510 Atherosclerotic heart disease of native coronary artery without angina pectoris: Secondary | ICD-10-CM

## 2022-07-02 DIAGNOSIS — I480 Paroxysmal atrial fibrillation: Secondary | ICD-10-CM

## 2022-07-02 DIAGNOSIS — D631 Anemia in chronic kidney disease: Secondary | ICD-10-CM

## 2022-07-02 DIAGNOSIS — J189 Pneumonia, unspecified organism: Secondary | ICD-10-CM

## 2022-07-02 DIAGNOSIS — J441 Chronic obstructive pulmonary disease with (acute) exacerbation: Secondary | ICD-10-CM

## 2022-07-02 LAB — H. PYLORI ANTIGEN, STOOL: H. Pylori Stool Ag, Eia: NEGATIVE

## 2022-07-06 ENCOUNTER — Inpatient Hospital Stay: Payer: Medicare Other

## 2022-07-06 VITALS — BP 176/68 | HR 67 | Temp 98.0°F | Resp 18

## 2022-07-06 DIAGNOSIS — D509 Iron deficiency anemia, unspecified: Secondary | ICD-10-CM | POA: Diagnosis not present

## 2022-07-06 MED ORDER — SODIUM CHLORIDE 0.9 % IV SOLN
510.0000 mg | INTRAVENOUS | Status: DC
  Administered 2022-07-06: 510 mg via INTRAVENOUS
  Filled 2022-07-06: qty 510

## 2022-07-06 MED ORDER — SODIUM CHLORIDE 0.9 % IV SOLN
Freq: Once | INTRAVENOUS | Status: AC
  Filled 2022-07-06: qty 250

## 2022-07-06 NOTE — Progress Notes (Signed)
Per Dr. Janese Banks okay to proceed with [facility] treatment today.   07/06/22 1321  Vitals  BP (!) 182/65  BP Location Left Arm  Patient Position Sitting  Pulse Rate 63  Resp 18  Temp 98 F (36.7 C)  Temp Source Tympanic

## 2022-07-06 NOTE — Patient Instructions (Signed)
[facility] [place]  Discharge Instructions: Thank you for choosing [facility] to provide your oncology and hematology care.  If you have a lab appointment with the Cancer Center, please go directly to the Cancer Center and check in at the registration area.  Wear comfortable clothing and clothing appropriate for easy access to any Portacath or PICC line.   We strive to give you quality time with your provider. You may need to reschedule your appointment if you arrive late (15 or more minutes).  Arriving late affects you and other patients whose appointments are after yours.  Also, if you miss three or more appointments without notifying the office, you may be dismissed from the clinic at the provider's discretion.      For prescription refill requests, have your pharmacy contact our office and allow 72 hours for refills to be completed.    Today you received the following chemotherapy and/or immunotherapy agents Feraheme.      To help prevent nausea and vomiting after your treatment, we encourage you to take your nausea medication as directed.  BELOW ARE SYMPTOMS THAT SHOULD BE REPORTED IMMEDIATELY: *FEVER GREATER THAN 100.4 F (38 C) OR HIGHER *CHILLS OR SWEATING *NAUSEA AND VOMITING THAT IS NOT CONTROLLED WITH YOUR NAUSEA MEDICATION *UNUSUAL SHORTNESS OF BREATH *UNUSUAL BRUISING OR BLEEDING *URINARY PROBLEMS (pain or burning when urinating, or frequent urination) *BOWEL PROBLEMS (unusual diarrhea, constipation, pain near the anus) TENDERNESS IN MOUTH AND THROAT WITH OR WITHOUT PRESENCE OF ULCERS (sore throat, sores in mouth, or a toothache) UNUSUAL RASH, SWELLING OR PAIN  UNUSUAL VAGINAL DISCHARGE OR ITCHING   Items with * indicate a potential emergency and should be followed up as soon as possible or go to the Emergency Department if any problems should occur.  Please show the CHEMOTHERAPY ALERT CARD or IMMUNOTHERAPY ALERT CARD at check-in to  the Emergency Department and triage nurse.  Should you have questions after your visit or need to cancel or reschedule your appointment, please contact [facility]  [phone] and follow the prompts.  Office hours are 8:00 a.m. to 4:30 p.m. Monday - Friday. Please note that voicemails left after 4:00 p.m. may not be returned until the following business day.  We are closed weekends and major holidays. You have access to a nurse at all times for urgent questions. Please call the main number to the clinic [phone] and follow the prompts.  For any non-urgent questions, you may also contact your provider using MyChart. We now offer e-Visits for anyone 18 and older to request care online for non-urgent symptoms. For details visit [email or web address].   Also download the MyChart app! Go to the app store, search "MyChart", open the app, select [facility], and log in with your MyChart username and password.  Masks are optional in the cancer centers. If you would like for your care team to wear a mask while they are taking care of you, please let them know. For doctor visits, patients may have with them one support person who is at least 86 years old. At this time, visitors are not allowed in the infusion area.   

## 2022-07-19 ENCOUNTER — Telehealth: Payer: Self-pay

## 2022-07-19 NOTE — Telephone Encounter (Signed)
Advised 

## 2022-07-19 NOTE — Telephone Encounter (Signed)
Ok for verbal orders  Wendelin Bradt Simmons-Robinson, MD  [facility]  [phone]    

## 2022-07-19 NOTE — Telephone Encounter (Signed)
Copied from [place] [phone]. Topic: Quick Communication - Home Health Verbal Orders >> Jul 19, 2022 11:59 AM Marcellus Scott wrote: Caller/Agency: Amy/Bayada Kenedy Number: [phone] Requesting OT/PT/Skilled Nursing/Social Work/Speech Therapy: PT Frequency: 1w2 starting next week.

## 2022-07-21 ENCOUNTER — Telehealth: Payer: Self-pay | Admitting: Family Medicine

## 2022-07-21 NOTE — Telephone Encounter (Signed)
Receive health call documentation.  No acute problems to follow-up on at this time.  Patient recommended for follow-up in October/November for chronic medical conditions.  Please call to schedule patient for an office visit for chronic conditions follow-up prior to 09/26/2022   Tammy Foster, MD  [facility]  [phone]

## 2022-07-22 NOTE — Telephone Encounter (Signed)
Spoke with Pearletha Forge (son) and made appt for 08/16/22 at 2:00 with Dr. Quentin Cornwall

## 2022-07-26 ENCOUNTER — Encounter (INDEPENDENT_AMBULATORY_CARE_PROVIDER_SITE_OTHER): Payer: Self-pay

## 2022-07-28 ENCOUNTER — Ambulatory Visit: Payer: Medicare Other | Admitting: Family Medicine

## 2022-07-30 ENCOUNTER — Emergency Department: Payer: Medicare Other

## 2022-07-30 ENCOUNTER — Encounter: Payer: Self-pay | Admitting: Emergency Medicine

## 2022-07-30 ENCOUNTER — Other Ambulatory Visit: Payer: Self-pay

## 2022-07-30 ENCOUNTER — Inpatient Hospital Stay
Admission: EM | Admit: 2022-07-30 | Discharge: 2022-08-04 | DRG: 291 | Disposition: A | Discharge status: Skilled Nursing Facility | Payer: Medicare Other | Attending: Internal Medicine | Admitting: Internal Medicine

## 2022-07-30 DIAGNOSIS — E785 Hyperlipidemia, unspecified: Secondary | ICD-10-CM | POA: Diagnosis present

## 2022-07-30 DIAGNOSIS — F0284 Dementia in other diseases classified elsewhere, unspecified severity, with anxiety: Secondary | ICD-10-CM | POA: Diagnosis present

## 2022-07-30 DIAGNOSIS — K219 Gastro-esophageal reflux disease without esophagitis: Secondary | ICD-10-CM | POA: Diagnosis present

## 2022-07-30 DIAGNOSIS — E1122 Type 2 diabetes mellitus with diabetic chronic kidney disease: Secondary | ICD-10-CM | POA: Diagnosis present

## 2022-07-30 DIAGNOSIS — Z86718 Personal history of other venous thrombosis and embolism: Secondary | ICD-10-CM

## 2022-07-30 DIAGNOSIS — R7989 Other specified abnormal findings of blood chemistry: Secondary | ICD-10-CM | POA: Diagnosis not present

## 2022-07-30 DIAGNOSIS — E871 Hypo-osmolality and hyponatremia: Secondary | ICD-10-CM | POA: Diagnosis present

## 2022-07-30 DIAGNOSIS — Z85820 Personal history of malignant melanoma of skin: Secondary | ICD-10-CM

## 2022-07-30 DIAGNOSIS — J9601 Acute respiratory failure with hypoxia: Secondary | ICD-10-CM

## 2022-07-30 DIAGNOSIS — I2510 Atherosclerotic heart disease of native coronary artery without angina pectoris: Secondary | ICD-10-CM | POA: Diagnosis present

## 2022-07-30 DIAGNOSIS — Z8041 Family history of malignant neoplasm of ovary: Secondary | ICD-10-CM

## 2022-07-30 DIAGNOSIS — I10 Essential (primary) hypertension: Secondary | ICD-10-CM | POA: Diagnosis present

## 2022-07-30 DIAGNOSIS — Z7902 Long term (current) use of antithrombotics/antiplatelets: Secondary | ICD-10-CM

## 2022-07-30 DIAGNOSIS — I5043 Acute on chronic combined systolic (congestive) and diastolic (congestive) heart failure: Secondary | ICD-10-CM | POA: Diagnosis present

## 2022-07-30 DIAGNOSIS — L89151 Pressure ulcer of sacral region, stage 1: Secondary | ICD-10-CM | POA: Diagnosis present

## 2022-07-30 DIAGNOSIS — I774 Celiac artery compression syndrome: Secondary | ICD-10-CM | POA: Diagnosis present

## 2022-07-30 DIAGNOSIS — Z66 Do not resuscitate: Secondary | ICD-10-CM | POA: Diagnosis present

## 2022-07-30 DIAGNOSIS — I771 Stricture of artery: Secondary | ICD-10-CM | POA: Diagnosis present

## 2022-07-30 DIAGNOSIS — Z789 Other specified health status: Secondary | ICD-10-CM | POA: Insufficient documentation

## 2022-07-30 DIAGNOSIS — E876 Hypokalemia: Secondary | ICD-10-CM | POA: Diagnosis present

## 2022-07-30 DIAGNOSIS — Z7982 Long term (current) use of aspirin: Secondary | ICD-10-CM

## 2022-07-30 DIAGNOSIS — Z79899 Other long term (current) drug therapy: Secondary | ICD-10-CM

## 2022-07-30 DIAGNOSIS — I5033 Acute on chronic diastolic (congestive) heart failure: Secondary | ICD-10-CM

## 2022-07-30 DIAGNOSIS — F0280 Dementia in other diseases classified elsewhere without behavioral disturbance: Secondary | ICD-10-CM | POA: Diagnosis present

## 2022-07-30 DIAGNOSIS — I5031 Acute diastolic (congestive) heart failure: Secondary | ICD-10-CM | POA: Diagnosis not present

## 2022-07-30 DIAGNOSIS — G3183 Dementia with Lewy bodies: Secondary | ICD-10-CM | POA: Diagnosis present

## 2022-07-30 DIAGNOSIS — L8990 Pressure ulcer of unspecified site, unspecified stage: Secondary | ICD-10-CM | POA: Insufficient documentation

## 2022-07-30 DIAGNOSIS — J449 Chronic obstructive pulmonary disease, unspecified: Secondary | ICD-10-CM | POA: Diagnosis present

## 2022-07-30 DIAGNOSIS — Z794 Long term (current) use of insulin: Secondary | ICD-10-CM | POA: Diagnosis not present

## 2022-07-30 DIAGNOSIS — R531 Weakness: Secondary | ICD-10-CM | POA: Diagnosis not present

## 2022-07-30 DIAGNOSIS — N1832 Chronic kidney disease, stage 3b: Secondary | ICD-10-CM | POA: Diagnosis present

## 2022-07-30 DIAGNOSIS — Z800 Family history of malignant neoplasm of digestive organs: Secondary | ICD-10-CM

## 2022-07-30 DIAGNOSIS — I130 Hypertensive heart and chronic kidney disease with heart failure and stage 1 through stage 4 chronic kidney disease, or unspecified chronic kidney disease: Principal | ICD-10-CM | POA: Diagnosis present

## 2022-07-30 DIAGNOSIS — F32A Depression, unspecified: Secondary | ICD-10-CM | POA: Diagnosis present

## 2022-07-30 DIAGNOSIS — Z85828 Personal history of other malignant neoplasm of skin: Secondary | ICD-10-CM

## 2022-07-30 DIAGNOSIS — E1151 Type 2 diabetes mellitus with diabetic peripheral angiopathy without gangrene: Secondary | ICD-10-CM | POA: Diagnosis present

## 2022-07-30 DIAGNOSIS — Z833 Family history of diabetes mellitus: Secondary | ICD-10-CM

## 2022-07-30 DIAGNOSIS — E1165 Type 2 diabetes mellitus with hyperglycemia: Secondary | ICD-10-CM | POA: Diagnosis present

## 2022-07-30 DIAGNOSIS — I480 Paroxysmal atrial fibrillation: Secondary | ICD-10-CM | POA: Diagnosis present

## 2022-07-30 DIAGNOSIS — D72829 Elevated white blood cell count, unspecified: Secondary | ICD-10-CM | POA: Diagnosis present

## 2022-07-30 DIAGNOSIS — R5381 Other malaise: Secondary | ICD-10-CM | POA: Diagnosis present

## 2022-07-30 DIAGNOSIS — F0283 Dementia in other diseases classified elsewhere, unspecified severity, with mood disturbance: Secondary | ICD-10-CM | POA: Diagnosis present

## 2022-07-30 DIAGNOSIS — I2489 Other forms of acute ischemic heart disease: Secondary | ICD-10-CM | POA: Diagnosis present

## 2022-07-30 DIAGNOSIS — I739 Peripheral vascular disease, unspecified: Secondary | ICD-10-CM | POA: Diagnosis not present

## 2022-07-30 DIAGNOSIS — F0390 Unspecified dementia without behavioral disturbance: Secondary | ICD-10-CM | POA: Diagnosis not present

## 2022-07-30 DIAGNOSIS — N189 Chronic kidney disease, unspecified: Secondary | ICD-10-CM | POA: Diagnosis present

## 2022-07-30 DIAGNOSIS — E1159 Type 2 diabetes mellitus with other circulatory complications: Secondary | ICD-10-CM | POA: Diagnosis present

## 2022-07-30 LAB — BASIC METABOLIC PANEL
Anion gap: 11 (ref 5–15)
BUN: 17 mg/dL (ref 8–23)
CO2: 25 mmol/L (ref 22–32)
Calcium: 8.8 mg/dL — ABNORMAL LOW (ref 8.9–10.3)
Chloride: 100 mmol/L (ref 98–111)
Creatinine, Ser: 0.89 mg/dL (ref 0.44–1.00)
GFR, Estimated: >60 mL/min (ref >60)
Glucose, Bld: 245 mg/dL — ABNORMAL HIGH (ref 70–99)
Potassium: 3.5 mmol/L (ref 3.5–5.1)
Sodium: 136 mmol/L (ref 135–145)

## 2022-07-30 LAB — CBC
HCT: 38.3 % (ref 36.0–46.0)
Hemoglobin: 12.2 g/dL (ref 12.0–15.0)
MCH: 26.5 pg (ref 26.0–34.0)
MCHC: 31.9 g/dL (ref 30.0–36.0)
MCV: 83.1 fL (ref 80.0–100.0)
Platelets: 382 10*3/uL (ref 150–400)
RBC: 4.61 MIL/uL (ref 3.87–5.11)
RDW: 23.9 % — ABNORMAL HIGH (ref 11.5–15.5)
WBC: 12.3 10*3/uL — ABNORMAL HIGH (ref 4.0–10.5)
nRBC: 0 % (ref 0.0–0.2)

## 2022-07-30 LAB — BRAIN NATRIURETIC PEPTIDE: B Natriuretic Peptide: 1821.3 pg/mL — ABNORMAL HIGH (ref 0.0–100.0)

## 2022-07-30 LAB — TROPONIN I (HIGH SENSITIVITY)
Troponin I (High Sensitivity): 22 ng/L — ABNORMAL HIGH (ref <18)
Troponin I (High Sensitivity): 25 ng/L — ABNORMAL HIGH (ref <18)

## 2022-07-30 LAB — CBG MONITORING, ED: Glucose-Capillary: 198 mg/dL — ABNORMAL HIGH (ref 70–99)

## 2022-07-30 MED ORDER — ISOSORBIDE MONONITRATE ER 30 MG PO TB24
30.0000 mg | ORAL_TABLET | Freq: Every day | ORAL | Status: DC
  Administered 2022-07-31 – 2022-08-04 (×5): 30 mg via ORAL
  Filled 2022-07-30 (×5): qty 1

## 2022-07-30 MED ORDER — LOSARTAN POTASSIUM 50 MG PO TABS
100.0000 mg | ORAL_TABLET | Freq: Every day | ORAL | Status: DC
  Administered 2022-07-31 – 2022-08-04 (×5): 100 mg via ORAL
  Filled 2022-07-30 (×5): qty 2

## 2022-07-30 MED ORDER — INSULIN GLARGINE-YFGN 100 UNIT/ML ~~LOC~~ SOLN
6.0000 [IU] | Freq: Every day | SUBCUTANEOUS | Status: DC
  Administered 2022-07-30 – 2022-08-03 (×5): 6 [IU] via SUBCUTANEOUS
  Filled 2022-07-30 (×6): qty 0.06

## 2022-07-30 MED ORDER — FUROSEMIDE 10 MG/ML IJ SOLN
60.0000 mg | Freq: Once | INTRAMUSCULAR | Status: AC
  Administered 2022-07-30: 60 mg via INTRAVENOUS
  Filled 2022-07-30: qty 8

## 2022-07-30 MED ORDER — ENOXAPARIN SODIUM 40 MG/0.4ML IJ SOSY
40.0000 mg | PREFILLED_SYRINGE | INTRAMUSCULAR | Status: DC
  Administered 2022-07-31 – 2022-08-01 (×2): 40 mg via SUBCUTANEOUS
  Filled 2022-07-30 (×2): qty 0.4

## 2022-07-30 MED ORDER — ONDANSETRON HCL 4 MG/2ML IJ SOLN: 4.0000 mg | Freq: Four times a day (QID) | INTRAMUSCULAR | Status: DC | PRN

## 2022-07-30 MED ORDER — ATORVASTATIN CALCIUM 10 MG PO TABS
10.0000 mg | ORAL_TABLET | Freq: Every day | ORAL | Status: DC
  Administered 2022-07-31 – 2022-08-04 (×5): 10 mg via ORAL
  Filled 2022-07-30 (×5): qty 1

## 2022-07-30 MED ORDER — FLUOXETINE HCL 10 MG PO CAPS
10.0000 mg | ORAL_CAPSULE | Freq: Three times a day (TID) | ORAL | Status: DC
  Administered 2022-07-30 – 2022-08-04 (×14): 10 mg via ORAL
  Filled 2022-07-30 (×15): qty 1

## 2022-07-30 MED ORDER — POLYETHYLENE GLYCOL 3350 17 G PO PACK
17.0000 g | PACK | Freq: Every day | ORAL | Status: DC | PRN
  Filled 2022-07-30: qty 1

## 2022-07-30 MED ORDER — MEMANTINE HCL 5 MG PO TABS
10.0000 mg | ORAL_TABLET | Freq: Two times a day (BID) | ORAL | Status: DC
  Administered 2022-07-30 – 2022-08-04 (×10): 10 mg via ORAL
  Filled 2022-07-30 (×10): qty 2

## 2022-07-30 MED ORDER — NITROGLYCERIN 0.4 MG SL SUBL: 0.4000 mg | SUBLINGUAL_TABLET | SUBLINGUAL | Status: DC | PRN

## 2022-07-30 MED ORDER — CARVEDILOL 6.25 MG PO TABS
6.2500 mg | ORAL_TABLET | Freq: Two times a day (BID) | ORAL | Status: DC
  Administered 2022-07-30 – 2022-08-04 (×10): 6.25 mg via ORAL
  Filled 2022-07-30 (×10): qty 1

## 2022-07-30 MED ORDER — SODIUM CHLORIDE 0.9% FLUSH
3.0000 mL | Freq: Two times a day (BID) | INTRAVENOUS | Status: DC
  Administered 2022-07-30 – 2022-08-04 (×10): 3 mL via INTRAVENOUS

## 2022-07-30 MED ORDER — EZETIMIBE 10 MG PO TABS
10.0000 mg | ORAL_TABLET | Freq: Every day | ORAL | Status: DC
  Administered 2022-07-31 – 2022-08-04 (×5): 10 mg via ORAL
  Filled 2022-07-30 (×6): qty 1

## 2022-07-30 MED ORDER — ACETAMINOPHEN 325 MG PO TABS
650.0000 mg | ORAL_TABLET | Freq: Four times a day (QID) | ORAL | Status: DC | PRN
  Administered 2022-08-03: 650 mg via ORAL
  Filled 2022-07-30: qty 2

## 2022-07-30 MED ORDER — ONDANSETRON HCL 4 MG PO TABS: 4.0000 mg | ORAL_TABLET | Freq: Four times a day (QID) | ORAL | Status: DC | PRN

## 2022-07-30 MED ORDER — TRAZODONE HCL 50 MG PO TABS
25.0000 mg | ORAL_TABLET | Freq: Every evening | ORAL | Status: DC | PRN
  Administered 2022-07-31 – 2022-08-03 (×3): 25 mg via ORAL
  Filled 2022-07-30 (×3): qty 1

## 2022-07-30 MED ORDER — CLOPIDOGREL BISULFATE 75 MG PO TABS
75.0000 mg | ORAL_TABLET | Freq: Every day | ORAL | Status: DC
  Administered 2022-07-31 – 2022-08-04 (×5): 75 mg via ORAL
  Filled 2022-07-30 (×6): qty 1

## 2022-07-30 MED ORDER — ASPIRIN 325 MG PO TABS
325.0000 mg | ORAL_TABLET | Freq: Every day | ORAL | Status: DC
  Administered 2022-07-31 – 2022-08-02 (×3): 325 mg via ORAL
  Filled 2022-07-30 (×4): qty 1

## 2022-07-30 MED ORDER — FUROSEMIDE 10 MG/ML IJ SOLN
40.0000 mg | Freq: Two times a day (BID) | INTRAMUSCULAR | Status: DC
  Administered 2022-07-31 – 2022-08-01 (×4): 40 mg via INTRAVENOUS
  Filled 2022-07-30 (×4): qty 4

## 2022-07-30 MED ORDER — QUETIAPINE FUMARATE 25 MG PO TABS
50.0000 mg | ORAL_TABLET | Freq: Every day | ORAL | Status: DC
  Administered 2022-07-30 – 2022-08-03 (×5): 50 mg via ORAL
  Filled 2022-07-30 (×5): qty 2

## 2022-07-30 MED ORDER — OXYCODONE HCL 5 MG PO TABS: 5.0000 mg | ORAL_TABLET | ORAL | Status: DC | PRN

## 2022-07-30 MED ORDER — ACETAMINOPHEN 325 MG PO TABS
650.0000 mg | ORAL_TABLET | Freq: Once | ORAL | Status: AC
  Administered 2022-07-30: 650 mg via ORAL
  Filled 2022-07-30: qty 2

## 2022-07-30 MED ORDER — PANTOPRAZOLE SODIUM 40 MG PO TBEC
40.0000 mg | DELAYED_RELEASE_TABLET | Freq: Every day | ORAL | Status: DC
  Administered 2022-07-31 – 2022-08-04 (×5): 40 mg via ORAL
  Filled 2022-07-30 (×5): qty 1

## 2022-07-30 MED ORDER — INSULIN ASPART 100 UNIT/ML IJ SOLN
3.0000 [IU] | Freq: Three times a day (TID) | INTRAMUSCULAR | Status: DC
  Administered 2022-07-31 – 2022-08-04 (×13): 3 [IU] via SUBCUTANEOUS
  Filled 2022-07-30 (×13): qty 1

## 2022-07-30 MED ORDER — ACETAMINOPHEN 650 MG RE SUPP: 650.0000 mg | Freq: Four times a day (QID) | RECTAL | Status: DC | PRN

## 2022-07-30 MED ORDER — ALBUTEROL SULFATE (2.5 MG/3ML) 0.083% IN NEBU: 2.5000 mg | INHALATION_SOLUTION | RESPIRATORY_TRACT | Status: DC | PRN

## 2022-07-30 NOTE — H&P (Addendum)
History and Physical    Patient: [name] DOB: 1928-11-22 DOA: 07/30/2022 DOS: the patient was seen and examined on 07/30/2022 PCP: Jerrol Banana., MD  Patient coming from: Home  Chief Complaint:  Chief Complaint  Patient presents with   Shortness of Breath        HPI: [name] is a 88 y.o. female with medical history significant for combined systolic and diastolic CHF, CKD, hypertension, paroxysmal A-fib, type 2 diabetes, Lewy body dementia, COPD, CAD, who presents with shortness of breath.  Reports that earlier today she felt significantly more short of breath than usual.  She reports that her oxygen level went down to 89%.  She reports that she lives independently, and that she has not missed any of her medications recently.  No chest pain at present.  Ankles have been a bit swollen but no significant lower extremity edema.  In the ED initial vital signs notable for hypertension as well as mild hypoxia requiring 2 L nasal cannula.  BMP was notable only for hyperglycemia with glucose 245, CBC showed mild leukocytosis but was otherwise unremarkable, BNP was markedly elevated at 1821, and increased from 760 to 49-monthago.  Troponin was mildly elevated on initial check at 22, on recheck 2 hours later it was flat at 25.  EKG showed likely limb lead reversal, but otherwise no acute ischemic changes.  She was given a dose of 60 mg IV Lasix and admitted for further management.    Review of Systems: As mentioned in the history of present illness. All other systems reviewed and are negative. Past Medical History:  Diagnosis Date   Anemia    Angina pectoris (HCC)    Atrial fibrillation (HCC)    Basal cell carcinoma    nose   Cervicalgia    CHF (congestive heart failure) (HCC)    Chronic back pain    Chronic kidney disease    Chronic obstructive pulmonary disease (COPD) (HCC)    COPD (chronic obstructive pulmonary disease) (HCC)    DDD  (degenerative disc disease), lumbar    Diabetes mellitus without complication ([place]    Dysphagia    Hyperlipemia    Hypertension    Squamous cell carcinoma of skin    left lateral pretibial   Vulvovaginitis    Past Surgical History:  Procedure Laterality Date   gall bladder     melanoma     removal neck and back   PERIPHERAL VASCULAR CATHETERIZATION Left 01/05/2016   Procedure: Lower Extremity Angiography;  Surgeon: JAlgernon Huxley MD;  [facility];  Service: Cardiovascular;  Laterality: Left;   PERIPHERAL VASCULAR CATHETERIZATION  01/05/2016   Procedure: Lower Extremity Intervention;  Surgeon: JAlgernon Huxley MD;  [facility];  Service: Cardiovascular;;   ureterolithiasis     calculus removed   VAGINAL HYSTERECTOMY     VISCERAL ANGIOGRAPHY N/A 05/10/2019   Procedure: VISCERAL ANGIOGRAPHY;  Surgeon: DAlgernon Huxley MD;  [facility];  Service: Cardiovascular;  Laterality: N/A;   VULVA / PERINEUM BIOPSY  05/29/2015   Social History:  reports that she has never smoked. She has never used smokeless tobacco. She reports that she does not drink alcohol and does not use drugs.  Allergies  Allergen Reactions   Bacitracin-Neomycin-Polymyxin Rash   Neomycin-Bacitracin Zn-Polymyx Swelling and Rash   Latex Rash   Lidocaine Rash   Aricept [Donepezil Hcl] Diarrhea   Benzalkonium Chloride Itching and Swelling  Ibuprofen     Other reaction(s): Dizziness Heart fluttering. Tachycardia. Tachycardia.   Lidocaine Hcl Itching    Per pt, "all caine meds cause severe itching"   Valdecoxib Nausea And Vomiting   Albuterol Rash   Tape Rash    blisters   Triamcinolone Rash    Family History  Problem Relation Age of Onset   Diabetes Sister    Colon cancer Brother    Diabetes Brother    Atrial fibrillation Daughter    Ovarian cancer Other    Breast cancer Neg Hx     Prior to Admission medications   Medication Sig Start Date End Date Taking?  Authorizing Provider  albuterol (VENTOLIN HFA) 108 (90 Base) MCG/ACT inhaler INHALE 2 PUFFS INTO THE LUNGS EVERY 4 HOURS AS NEEDED FOR WHEEZING ORSHORTNESS OF BREATH 04/12/22   Jerrol Banana., MD  aspirin 325 MG tablet Take 325 mg by mouth daily. Reported on 01/05/2016    [provider]  atorvastatin (LIPITOR) 10 MG tablet TAKE 1 TABLET BY MOUTH ONCE DAILY. 04/06/22   Jerrol Banana., MD  carvedilol (COREG) 6.25 MG tablet Take 1 tablet (6.25 mg total) by mouth 2 (two) times daily with a meal. 06/18/22   Fritzi Mandes, MD  clopidogrel (PLAVIX) 75 MG tablet TAKE 1 TABLET BY MOUTH DAILY 04/06/22   Jerrol Banana., MD  cyanocobalamin 1000 MCG tablet Take 1 tablet (1,000 mcg total) by mouth daily. 06/18/22   Fritzi Mandes, MD  Dupilumab (DUPIXENT) 300 MG/2ML SOPN Inject 300 mg into the skin every 14 (fourteen) days. Starting at day 15 for maintenance. 03/10/22   Ralene Bathe, MD  ezetimibe (ZETIA) 10 MG tablet TAKE 1 TABLET BY MOUTH DAILY 04/30/22   Jerrol Banana., MD  fexofenadine (ALLEGRA) 180 MG tablet Take 180 mg by mouth daily.    [provider]  FLUoxetine (PROZAC) 10 MG capsule TAKE 1 CAPSULE BY MOUTH 3 TIMES DAILY. Patient not taking: Reported on 06/21/2022 04/30/22   Jerrol Banana., MD  glucose blood test strip ACCU-CHEK AVIVA PLUS TEST STRP Use to check sugars 3 times daily. 10/28/15   [provider]  HUMALOG KWIKPEN 100 UNIT/ML KwikPen Inject 6 Units into the skin 3 (three) times daily. Patient taking differently: Inject 12 Units into the skin 3 (three) times daily. 06/18/22   Fritzi Mandes, MD  insulin glargine (LANTUS) 100 UNIT/ML injection Inject 0.12 mLs (12 Units total) into the skin at bedtime. 06/18/22   Fritzi Mandes, MD  iron polysaccharides (NIFEREX) 150 MG capsule Take 1 capsule (150 mg total) by mouth daily. 06/18/22   Fritzi Mandes, MD  isosorbide mononitrate (IMDUR) 30 MG 24 hr tablet Take 30 mg by mouth daily. 06/12/20   [provider]  lansoprazole (PREVACID) 30 MG capsule TAKE 1 CAPSULE BY MOUTH ONCE DAILY AT NOON Patient taking differently: Take 30 mg by mouth in the morning. 06/01/22   Jerrol Banana., MD  losartan (COZAAR) 100 MG tablet TAKE 1 TABLET BY MOUTH DAILY 06/01/22   Jerrol Banana., MD  memantine [facility]) 10 MG tablet Take 10 mg by mouth 2 (two) times daily. 12/31/21   [provider]  QUEtiapine (SEROQUEL) 50 MG tablet Take 50 mg by mouth at bedtime. 11/09/21   [provider]  vitamin E 1000 UNIT capsule Take 1,000 Units by mouth daily.    [provider]    Physical Exam: Vitals:   07/30/22 1800 07/30/22  1810 07/30/22 1815 07/30/22 1954  BP: (!) 169/110 (!) 180/102  (!) 184/94  Pulse: 94 90 91 89  Resp:  19 20 (!) 22  Temp:    97.7 F (36.5 C)  TempSrc:    Oral  SpO2:    98%   Physical Exam Vitals reviewed.  Constitutional:      General: She is not in acute distress.    Appearance: She is not ill-appearing, toxic-appearing or diaphoretic.  HENT:     Head: Normocephalic and atraumatic.  Neck:     Vascular: Hepatojugular reflux present.  Cardiovascular:     Rate and Rhythm: Normal rate. Rhythm irregular.     Heart sounds: Normal heart sounds. No murmur heard. Pulmonary:     Effort: Pulmonary effort is normal. No tachypnea.     Breath sounds: Examination of the left-lower field reveals decreased breath sounds. Decreased breath sounds and rales present.     Comments: Coarse crackles throughout lung fields, more pronounced at the bases Abdominal:     General: Bowel sounds are normal.     Palpations: Abdomen is soft. There is no hepatomegaly, splenomegaly or mass.     Tenderness: There is no abdominal tenderness. There is no guarding or rebound.  Musculoskeletal:     Right lower leg: No edema.     Left lower leg: No edema.  Skin:    General: Skin is warm and dry.  Neurological:     General: No focal deficit present.     Mental Status: She  is alert.  Psychiatric:        Mood and Affect: Mood normal.        Behavior: Behavior normal.      Data Reviewed:  Results for orders placed or performed during the hospital encounter of 07/30/22 (from the past 24 hour(s))  Basic metabolic panel     Status: Abnormal   Collection Time: 07/30/22  4:06 PM  Result Value Ref Range   Sodium 136 135 - 145 mmol/L   Potassium 3.5 3.5 - 5.1 mmol/L   Chloride 100 98 - 111 mmol/L   CO2 25 22 - 32 mmol/L   Glucose, Bld 245 (H) 70 - 99 mg/dL   BUN 17 8 - 23 mg/dL   Creatinine, Ser 0.89 0.44 - 1.00 mg/dL   Calcium 8.8 (L) 8.9 - 10.3 mg/dL   GFR, Estimated >60 >60 mL/min   Anion gap 11 5 - 15  CBC     Status: Abnormal   Collection Time: 07/30/22  4:06 PM  Result Value Ref Range   WBC 12.3 (H) 4.0 - 10.5 K/uL   RBC 4.61 3.87 - 5.11 MIL/uL   Hemoglobin 12.2 12.0 - 15.0 g/dL   HCT 38.3 36.0 - 46.0 %   MCV 83.1 80.0 - 100.0 fL   MCH 26.5 26.0 - 34.0 pg   MCHC 31.9 30.0 - 36.0 g/dL   RDW 23.9 (H) 11.5 - 15.5 %   Platelets 382 150 - 400 K/uL   nRBC 0.0 0.0 - 0.2 %  Brain natriuretic peptide     Status: Abnormal   Collection Time: 07/30/22  4:06 PM  Result Value Ref Range   B Natriuretic Peptide 1,821.3 (H) 0.0 - 100.0 pg/mL  Troponin I (High Sensitivity)     Status: Abnormal   Collection Time: 07/30/22  4:06 PM  Result Value Ref Range   Troponin I (High Sensitivity) 22 (H) <18 ng/L  Troponin I (High Sensitivity)  Status: Abnormal   Collection Time: 07/30/22  6:15 PM  Result Value Ref Range   Troponin I (High Sensitivity) 25 (H) <18 ng/L   DG Chest 2 View CLINICAL DATA:  Shortness of breath since last night.  EXAM: CHEST - 2 VIEW  COMPARISON:  06/22/2022  FINDINGS: Stable enlarged cardiac silhouette. Calcified aortic arch. Stable mild prominence of the interstitial markings small amount of linear scarring or atelectasis in the left mid lung zone. Stable small bilateral pleural effusions. Mild-to-moderate  peribronchial thickening. Midthoracic spine degenerative changes. Mild left glenohumeral joint degenerative changes.  IMPRESSION: 1. No acute abnormality. 2. Stable cardiomegaly, mild chronic interstitial lung disease and possible mild interstitial pulmonary edema, small bilateral pleural effusions and mild to moderate bronchitic changes.  Electronically Signed   By: Claudie Revering M.D.   On: 07/30/2022 16:49  EKG (my read): Sinus with PACs, suspected limb lead reversal as there is a right axis deviation which is not present on prior, no acute ischemic changes and no significant change from prior with exception of already noted suspected limb lead reversal  There are no new results to review at this time.  Assessment and Plan: [name] is a 88 y.o. female with medical history significant for combined systolic and diastolic CHF, CKD, hypertension, paroxysmal A-fib, type 2 diabetes, Lewy body dementia, COPD, CAD, who presents with shortness of breath and is found to be in acute on chronic combined systolic and diastolic CHF exacerbation.   * Acute on chronic combined systolic and diastolic CHF (congestive heart failure) (HCC) Lab, imaging, exam all consistent with acute CHF exacerbation, also has new O2 requirement.  Unclear etiology, she reports she takes all of her meds.  Had echo during last admission in September that showed newly depressed ejection fraction, short interval but feel that reassessing her EF would be beneficial at this time. - Lasix 40 mg twice daily IV - Strict I&O - Daily weights - Trend BMP and replete lites as needed - Wean O2 as tolerated - Follow-up TTE - Consider cardiology consultation in a.m. - Continue atorvastatin, carvedilol, clopidogrel, ezetimibe, aspirin, Imdur, losartan  Elevated troponin Low level and has remained flat, suspect demand ischemia in setting of volume overload, will obtain 1 additional value to ensure not rising  significantly.  Known medical problems DM2-reduce home long-acting insulin from 12 to 6 units, mealtime insulin from 6 to 3 units 3 times daily, add sliding scale correction factor as needed Lewy body dementia-continue memantine Anxiety/depression-continue Seroquel, fluoxetine COPD-continue albuterol as needed GERD-continue PPI      Advance Care Planning:   Code Status: DNR   Consults: None  Family Communication: None  Severity of Illness: The appropriate patient status for this patient is INPATIENT. Inpatient status is judged to be reasonable and necessary in order to provide the required intensity of service to ensure the patient's safety. The patient's presenting symptoms, physical exam findings, and initial radiographic and laboratory data in the context of their chronic comorbidities is felt to place them at high risk for further clinical deterioration. Furthermore, it is not anticipated that the patient will be medically stable for discharge from the hospital within 2 midnights of admission.   * I certify that at the point of admission it is my clinical judgment that the patient will require inpatient hospital care spanning beyond 2 midnights from the point of admission due to high intensity of service, high risk for further deterioration and high frequency of surveillance required.*  Author: Annice Needy  Dione Plover, MD 07/30/2022 10:46 PM  For on call review [email or web address].

## 2022-07-30 NOTE — Assessment & Plan Note (Signed)
Only mild elevation at 25 and 27.  This is secondary to CHF.

## 2022-07-30 NOTE — ED Notes (Signed)
Hospitalist at bedside 

## 2022-07-30 NOTE — ED Notes (Signed)
First Nurse Note: Pt to ED via [facility] from home for shortness of breath. Pt states that this has been going on for about 2 months but suddenly got worse last night. Pt has hx/o COPD, HTN, and DM. Pt SpO2 initially around 91% on room air.  Last VS per EMS SpO2: 96% on 2 liters BP: 191/111 HR: 100 Temp: 98.1 CBG: 383

## 2022-07-30 NOTE — ED Provider Triage Note (Signed)
Emergency Medicine Provider Triage Evaluation Note  [name] , a 88 y.o. female  was evaluated in triage.  Pt complains of SOB. H/o OF CHF and COPD. EMS found her on 91% on RA, put her on 2L O2. No leg swelling. No CP. No recent weight gain.  Review of Systems  Positive: SOB Negative: CP, fever  Physical Exam  BP (!) 182/90 (BP Location: Right Arm)   Pulse 93   Temp 97.9 F (36.6 C) (Oral)   Resp 18   SpO2 97%  Gen:   Awake, no distress   Resp:  Normal effort. On 2L [place] MSK:   Moves extremities without difficulty  Other:    Medical Decision Making  Medically screening exam initiated at 4:07 PM.  Appropriate orders placed.  [name] was informed that the remainder of the evaluation will be completed by another provider, this initial triage assessment does not replace that evaluation, and the importance of remaining in the ED until their evaluation is complete.     Marquette Old, PA-C 07/30/22 1611

## 2022-07-30 NOTE — ED Provider Notes (Addendum)
[facility] Provider Note    Event Date/Time   First MD Initiated Contact with Patient 07/30/22 1757     (approximate)   History   Shortness of Breath (/)   HPI  [name] is a 88 y.o. female history of COPD as well as CHF presents to the ER for evaluation of worsening shortness of breath exertional weakness and dyspnea over the past few days.  Endorsing worsening orthopnea.  She denies any chest pain or pressure at this time.  Was found to be hypoxic to the mid 67s on room air.  Was placed on 2 L nasal cannula with improvement in her symptoms.  She denies any pleuritic pain.  Denies any fevers or chills.     Physical Exam   Triage Vital Signs: ED Triage Vitals  Enc Vitals Group     BP 07/30/22 1536 (!) 182/90     Pulse Rate 07/30/22 1536 93     Resp 07/30/22 1536 18     Temp 07/30/22 1536 97.9 F (36.6 C)     Temp Source 07/30/22 1536 Oral     SpO2 07/30/22 1536 97 %     Weight --      Height --      Head Circumference --      Peak Flow --      Pain Score 07/30/22 1604 0     Pain Loc --      Pain Edu? --      Excl. in [place]? --     Most recent vital signs: Vitals:   07/30/22 1815 07/30/22 1954  BP:  (!) 184/94  Pulse: 91 89  Resp: 20 (!) 22  Temp:  97.7 F (36.5 C)  SpO2:  98%     Constitutional: Alert  Eyes: Conjunctivae are normal.  Head: Atraumatic. Nose: No congestion/rhinnorhea. Mouth/Throat: Mucous membranes are moist.   Neck: Painless ROM.  Cardiovascular:   Good peripheral circulation. Respiratory: Normal respiratory effort. Inspiratory crackles throughout  Gastrointestinal: Soft and nontender.  Musculoskeletal:  no deformity Neurologic:  MAE spontaneously. No gross focal neurologic deficits are appreciated.  Skin:  Skin is warm, dry and intact. No rash noted. Psychiatric: Mood and affect are normal. Speech and behavior are normal.    ED Results / Procedures / Treatments   Labs (all labs ordered are  listed, but only abnormal results are displayed) Labs Reviewed  BASIC METABOLIC PANEL - Abnormal; Notable for the following components:      Result Value   Glucose, Bld 245 (*)    Calcium 8.8 (*)    All other components within normal limits  CBC - Abnormal; Notable for the following components:   WBC 12.3 (*)    RDW 23.9 (*)    All other components within normal limits  BRAIN NATRIURETIC PEPTIDE - Abnormal; Notable for the following components:   B Natriuretic Peptide 1,821.3 (*)    All other components within normal limits  TROPONIN I (HIGH SENSITIVITY) - Abnormal; Notable for the following components:   Troponin I (High Sensitivity) 22 (*)    All other components within normal limits  TROPONIN I (HIGH SENSITIVITY) - Abnormal; Notable for the following components:   Troponin I (High Sensitivity) 25 (*)    All other components within normal limits     EKG  ED ECG REPORT I, Merlyn Lot, the attending physician, personally viewed and interpreted this ECG.   Date: 07/30/2022  EKG Time: 16:06  Rate: 90  Rhythm: sinus  Axis: limb lead reversal  Intervals:normal qt  ST&T Change: nonspecific st abn, no stemi    RADIOLOGY Please see ED Course for my review and interpretation.  I personally reviewed all radiographic images ordered to evaluate for the above acute complaints and reviewed radiology reports and findings.  These findings were personally discussed with the patient.  Please see medical record for radiology report.    PROCEDURES:  Critical Care performed: Yes, see critical care procedure note(s)  .Critical Care  Performed by: Merlyn Lot, MD Authorized by: Merlyn Lot, MD   Critical care provider statement:    Critical care time (minutes):  35   Critical care was necessary to treat or prevent imminent or life-threatening deterioration of the following conditions:  Respiratory failure   Critical care was time spent personally by me on the  following activities:  Ordering and performing treatments and interventions, ordering and review of laboratory studies, ordering and review of radiographic studies, pulse oximetry, re-evaluation of patient's condition, review of old charts, obtaining history from patient or surrogate, examination of patient, evaluation of patient's response to treatment, discussions with primary provider, discussions with consultants and development of treatment plan with patient or surrogate    MEDICATIONS ORDERED IN ED: Medications  nitroGLYCERIN (NITROSTAT) SL tablet 0.4 mg (has no administration in time range)  furosemide (LASIX) injection 60 mg (60 mg Intravenous Given 07/30/22 1957)  acetaminophen (TYLENOL) tablet 650 mg (650 mg Oral Given 07/30/22 1956)     IMPRESSION / MDM / [place] / ED COURSE  I reviewed the triage vital signs and the nursing notes.                              Differential diagnosis includes, but is not limited to, Asthma, copd, CHF, pna, ptx, malignancy, Pe, anemia  Patient presenting to the ER for evaluation of symptoms as described above.  Based on symptoms, risk factors and considered above differential, this presenting complaint could reflect a potentially life-threatening illness therefore the patient will be placed on continuous pulse oximetry and telemetry for monitoring.  Laboratory evaluation will be sent to evaluate for the above complaints.  Chest x-ray on my review and interpretation shows evidence of pulmonary edema.  Her BNP is elevated.  Troponin mildly elevated likely demand ischemia in the setting of CHF.  Given her acute hypoxia with no previous O2 requirement I do feel that she can require hospitalization for observation after IV diuresis.  Hospitalist consulted for admission.        FINAL CLINICAL IMPRESSION(S) / ED DIAGNOSES   Final diagnoses:  Acute respiratory failure with hypoxia (HCC)  Acute on chronic diastolic CHF (congestive heart  failure) ([place])     Rx / DC Orders   ED Discharge Orders     None        Note:  This document was prepared using Dragon voice recognition software and may include unintentional dictation errors.    Merlyn Lot, MD 07/30/22 2203

## 2022-07-30 NOTE — ED Triage Notes (Signed)
Patient to ED via [facility] from home for SOB since last night. Hx of COPD. Placed on 2L [place] by EMS- patient states she feels better since getting oxygen. Speaking in full complete sentences. NAD noted.

## 2022-07-30 NOTE — Assessment & Plan Note (Deleted)
DM2-reduce home long-acting insulin from 12 to 6 units, mealtime insulin from 6 to 3 units 3 times daily, add sliding scale correction factor as needed Lewy body dementia-continue memantine Anxiety/depression-continue Seroquel, fluoxetine COPD-continue albuterol as needed GERD-continue PPI

## 2022-07-30 NOTE — Assessment & Plan Note (Addendum)
Repeat echo shows an EF of 45 to 50%.  The patient was diuresed with IV Lasix.  Switch to Lasix 40 mg daily.  Continue losartan and Coreg.  Refer to CHF clinic.  With creatinine elevation of 1.33 we will hold off on starting spironolactone at this point.  Can consider starting as outpatient

## 2022-07-31 ENCOUNTER — Encounter: Payer: Self-pay | Admitting: Family Medicine

## 2022-07-31 DIAGNOSIS — E876 Hypokalemia: Secondary | ICD-10-CM | POA: Diagnosis not present

## 2022-07-31 DIAGNOSIS — R531 Weakness: Secondary | ICD-10-CM

## 2022-07-31 DIAGNOSIS — L8990 Pressure ulcer of unspecified site, unspecified stage: Secondary | ICD-10-CM | POA: Insufficient documentation

## 2022-07-31 DIAGNOSIS — I5043 Acute on chronic combined systolic (congestive) and diastolic (congestive) heart failure: Secondary | ICD-10-CM | POA: Diagnosis not present

## 2022-07-31 LAB — BASIC METABOLIC PANEL
Anion gap: 7 (ref 5–15)
BUN: 16 mg/dL (ref 8–23)
CO2: 32 mmol/L (ref 22–32)
Calcium: 8.8 mg/dL — ABNORMAL LOW (ref 8.9–10.3)
Chloride: 96 mmol/L — ABNORMAL LOW (ref 98–111)
Creatinine, Ser: 0.79 mg/dL (ref 0.44–1.00)
GFR, Estimated: >60 mL/min (ref >60)
Glucose, Bld: 189 mg/dL — ABNORMAL HIGH (ref 70–99)
Potassium: 3 mmol/L — ABNORMAL LOW (ref 3.5–5.1)
Sodium: 135 mmol/L (ref 135–145)

## 2022-07-31 LAB — CBC
HCT: 38 % (ref 36.0–46.0)
Hemoglobin: 12.3 g/dL (ref 12.0–15.0)
MCH: 26.6 pg (ref 26.0–34.0)
MCHC: 32.4 g/dL (ref 30.0–36.0)
MCV: 82.1 fL (ref 80.0–100.0)
Platelets: 367 10*3/uL (ref 150–400)
RBC: 4.63 MIL/uL (ref 3.87–5.11)
RDW: 23.4 % — ABNORMAL HIGH (ref 11.5–15.5)
WBC: 11.7 10*3/uL — ABNORMAL HIGH (ref 4.0–10.5)
nRBC: 0 % (ref 0.0–0.2)

## 2022-07-31 LAB — GLUCOSE, CAPILLARY
Glucose-Capillary: 154 mg/dL — ABNORMAL HIGH (ref 70–99)
Glucose-Capillary: 132 mg/dL — ABNORMAL HIGH (ref 70–99)
Glucose-Capillary: 98 mg/dL (ref 70–99)
Glucose-Capillary: 208 mg/dL — ABNORMAL HIGH (ref 70–99)

## 2022-07-31 LAB — TROPONIN I (HIGH SENSITIVITY): Troponin I (High Sensitivity): 27 ng/L — ABNORMAL HIGH (ref <18)

## 2022-07-31 MED ORDER — POTASSIUM CHLORIDE CRYS ER 20 MEQ PO TBCR
40.0000 meq | EXTENDED_RELEASE_TABLET | Freq: Two times a day (BID) | ORAL | Status: AC
  Administered 2022-07-31 (×2): 40 meq via ORAL
  Filled 2022-07-31 (×2): qty 2

## 2022-07-31 NOTE — Progress Notes (Signed)
PROGRESS NOTE    [name]  HWE:[id number] DOB: 04-Mar-1929 DOA: 07/30/2022 PCP: Jerrol Banana., MD  Assessment & Plan:   Principal Problem:   Acute on chronic combined systolic and diastolic CHF (congestive heart failure) (HCC) Active Problems:   Benign essential HTN   Chronic kidney disease   H/O deep venous thrombosis   Paroxysmal atrial fibrillation (HCC)   Type 2 diabetes mellitus with vascular disease (HCC)   PVD (peripheral vascular disease) ([place])   Lewy body dementia ([place])   Celiac artery stenosis ([place])   Coronary artery disease involving native coronary artery of native heart   COPD (chronic obstructive pulmonary disease) (HCC)   Known medical problems   Elevated troponin   Pressure injury of skin  Assessment and Plan:  Acute on chronic combined CHF exacerbation: continue on IV lasix. Monitor I/Os. Echo ordered. Continue on coreg, statin, imdur, losartan, plavix & ezetimibe  Hypokalemia: potassium ordered   DM2: likely well controlled. Continue on glargine, aspart  Generalized weakness: PT/OT consulted   Depression: severity unknown. Continue on home dose of fluoxetine   Elevated troponins: likely secondary to demand ischemia  Lewy body dementia: continue on home dose of memantine, seroquel  GERD: continue on PPI  COPD: w/o exacerbation. Continue on bronchodilators     DVT prophylaxis: lovenox  Code Status:  full  Family Communication: called pt's daughter, [name], no answer so I left a voicemail  Disposition Plan: depends on PT/OT recs   Level of care: Telemetry Cardiac  Status is: Inpatient Remains inpatient appropriate because: severity of illness    Consultants:    Procedures:   Antimicrobials:    Subjective: Pt c/o fatigue   Objective: Vitals:   07/31/22 0128 07/31/22 0157 07/31/22 0518 07/31/22 0743  BP: 110/79  (!) 159/84 (!) 163/80  Pulse: 79  79 79  Resp: '19  18 20  '$ Temp:   98.3 F (36.8 C) 98.1 F (36.7  C)  TempSrc:      SpO2: 100%  99% 99%  Weight:  56.9 kg    Height:  '5\' 2"'$  (1.575 m)      Intake/Output Summary (Last 24 hours) at 07/31/2022 0745 Last data filed at 07/30/2022 2326 Gross per 24 hour  Intake 358 ml  Output 2300 ml  Net -1942 ml   Filed Weights   07/30/22 2300 07/31/22 0157  Weight: 57.4 kg 56.9 kg    Examination:  General exam: Appears calm and comfortable  Respiratory system: diminished breath sounds b/l  Cardiovascular system: S1 & S2 +. No rubs, gallops or clicks.  Gastrointestinal system: Abdomen is nondistended, soft and nontender. Normal bowel sounds heard. Central nervous system: Alert and awake. Moves all extremities  Psychiatry: Judgement and insight appears at baseline. Mood & affect appropriate.     Data Reviewed: I have personally reviewed following labs and imaging studies  CBC: Recent Labs  Lab 07/30/22 1606 07/31/22 0231  WBC 12.3* 11.7*  HGB 12.2 12.3  HCT 38.3 38.0  MCV 83.1 82.1  PLT 382 893   Basic Metabolic Panel: Recent Labs  Lab 07/30/22 1606 07/31/22 0231  NA 136 135  K 3.5 3.0*  CL 100 96*  CO2 25 32  GLUCOSE 245* 189*  BUN 17 16  CREATININE 0.89 0.79  CALCIUM 8.8* 8.8*   GFR: Estimated Creatinine Clearance: 36.2 mL/min (by C-G formula based on SCr of 0.79 mg/dL). Liver Function Tests: No results for input(s): "AST", "ALT", "ALKPHOS", "BILITOT", "PROT", "ALBUMIN" in the last  168 hours. No results for input(s): "LIPASE", "AMYLASE" in the last 168 hours. No results for input(s): "AMMONIA" in the last 168 hours. Coagulation Profile: No results for input(s): "INR", "PROTIME" in the last 168 hours. Cardiac Enzymes: No results for input(s): "CKTOTAL", "CKMB", "CKMBINDEX", "TROPONINI" in the last 168 hours. BNP (last 3 results) No results for input(s): "PROBNP" in the last 8760 hours. HbA1C: No results for input(s): "HGBA1C" in the last 72 hours. CBG: Recent Labs  Lab 07/30/22 2313 07/31/22 0741  GLUCAP 198*  154*   Lipid Profile: No results for input(s): "CHOL", "HDL", "LDLCALC", "TRIG", "CHOLHDL", "LDLDIRECT" in the last 72 hours. Thyroid Function Tests: No results for input(s): "TSH", "T4TOTAL", "FREET4", "T3FREE", "THYROIDAB" in the last 72 hours. Anemia Panel: No results for input(s): "VITAMINB12", "FOLATE", "FERRITIN", "TIBC", "IRON", "RETICCTPCT" in the last 72 hours. Sepsis Labs: No results for input(s): "PROCALCITON", "LATICACIDVEN" in the last 168 hours.  No results found for this or any previous visit (from the past 240 hour(s)).       Radiology Studies: DG Chest 2 View  Result Date: 07/30/2022 CLINICAL DATA:  Shortness of breath since last night. EXAM: CHEST - 2 VIEW COMPARISON:  06/22/2022 FINDINGS: Stable enlarged cardiac silhouette. Calcified aortic arch. Stable mild prominence of the interstitial markings small amount of linear scarring or atelectasis in the left mid lung zone. Stable small bilateral pleural effusions. Mild-to-moderate peribronchial thickening. Midthoracic spine degenerative changes. Mild left glenohumeral joint degenerative changes. IMPRESSION: 1. No acute abnormality. 2. Stable cardiomegaly, mild chronic interstitial lung disease and possible mild interstitial pulmonary edema, small bilateral pleural effusions and mild to moderate bronchitic changes. Electronically Signed   By: Claudie Revering M.D.   On: 07/30/2022 16:49        Scheduled Meds:  aspirin  325 mg Oral Daily   atorvastatin  10 mg Oral Daily   carvedilol  6.25 mg Oral BID WC   clopidogrel  75 mg Oral Daily   enoxaparin (LOVENOX) injection  40 mg Subcutaneous Q24H   ezetimibe  10 mg Oral Daily   FLUoxetine  10 mg Oral TID   furosemide  40 mg Intravenous BID   insulin aspart  3 Units Subcutaneous TID WC   insulin glargine-yfgn  6 Units Subcutaneous QHS   isosorbide mononitrate  30 mg Oral Daily   losartan  100 mg Oral Daily   memantine  10 mg Oral BID   pantoprazole  40 mg Oral Daily    QUEtiapine  50 mg Oral QHS   sodium chloride flush  3 mL Intravenous Q12H   Continuous Infusions:   LOS: 1 day    Time spent: 35 mins     Wyvonnia Dusky, MD [facility] Pager [phone]  If 7PM-7AM, please contact night-coverage [email or web address] 07/31/2022, 7:45 AM

## 2022-07-31 NOTE — ED Notes (Signed)
Request made for transport to the floor ?

## 2022-08-01 DIAGNOSIS — R531 Weakness: Secondary | ICD-10-CM | POA: Diagnosis not present

## 2022-08-01 DIAGNOSIS — F0390 Unspecified dementia without behavioral disturbance: Secondary | ICD-10-CM | POA: Diagnosis not present

## 2022-08-01 DIAGNOSIS — I5043 Acute on chronic combined systolic (congestive) and diastolic (congestive) heart failure: Secondary | ICD-10-CM | POA: Diagnosis not present

## 2022-08-01 LAB — BASIC METABOLIC PANEL
Anion gap: 12 (ref 5–15)
BUN: 19 mg/dL (ref 8–23)
CO2: 29 mmol/L (ref 22–32)
Calcium: 8.8 mg/dL — ABNORMAL LOW (ref 8.9–10.3)
Chloride: 96 mmol/L — ABNORMAL LOW (ref 98–111)
Creatinine, Ser: 1.06 mg/dL — ABNORMAL HIGH (ref 0.44–1.00)
GFR, Estimated: 50 mL/min — ABNORMAL LOW (ref >60)
Glucose, Bld: 149 mg/dL — ABNORMAL HIGH (ref 70–99)
Potassium: 3.7 mmol/L (ref 3.5–5.1)
Sodium: 137 mmol/L (ref 135–145)

## 2022-08-01 LAB — GLUCOSE, CAPILLARY
Glucose-Capillary: 145 mg/dL — ABNORMAL HIGH (ref 70–99)
Glucose-Capillary: 128 mg/dL — ABNORMAL HIGH (ref 70–99)
Glucose-Capillary: 249 mg/dL — ABNORMAL HIGH (ref 70–99)
Glucose-Capillary: 206 mg/dL — ABNORMAL HIGH (ref 70–99)

## 2022-08-01 LAB — CBC
HCT: 39.5 % (ref 36.0–46.0)
Hemoglobin: 12.8 g/dL (ref 12.0–15.0)
MCH: 26.7 pg (ref 26.0–34.0)
MCHC: 32.4 g/dL (ref 30.0–36.0)
MCV: 82.3 fL (ref 80.0–100.0)
Platelets: 361 10*3/uL (ref 150–400)
RBC: 4.8 MIL/uL (ref 3.87–5.11)
RDW: 23.2 % — ABNORMAL HIGH (ref 11.5–15.5)
WBC: 10.8 10*3/uL — ABNORMAL HIGH (ref 4.0–10.5)
nRBC: 0 % (ref 0.0–0.2)

## 2022-08-01 MED ORDER — ENOXAPARIN SODIUM 30 MG/0.3ML IJ SOSY
30.0000 mg | PREFILLED_SYRINGE | INTRAMUSCULAR | Status: DC
  Administered 2022-08-02 – 2022-08-04 (×3): 30 mg via SUBCUTANEOUS
  Filled 2022-08-01 (×3): qty 0.3

## 2022-08-01 NOTE — Evaluation (Signed)
Occupational Therapy Evaluation Patient Details Name: [name] MRN: [id number] DOB: Jan 24, 1929 Today's Date: 08/01/2022   History of Present Illness Pt is a 88 year old female admitted with Acute on chronic combined CHF exacerbation; PMH significant for combined systolic and diastolic CHF, CKD, hypertension, paroxysmal A-fib, type 2 diabetes, Lewy body dementia, COPD, CAD   Clinical Impression   Chart reviewed, nurse cleared pt for participation in OT evaluation. Pt is alert and oriented x4, intermittent vcs required throughout for safety. PTA Pt is MOD I in ADL except she will have supervision for bathing, assist for IADLs. Pt reports recently med management has become more difficult as her vision and [facility] deteriorates. Pharmacy will fill script and pill boxes but pt unable to see/feel pills when taking them out of the pillbox resulting in her dropping pills. Pt presents with deficits in strength, endurance,activity tolerance, balance, vision affecting safe and optimal ADL completion. Noted carry over from previous therapy sessions regarding EC techniques- pt performs with no prompting required. Recommend HHOT following discharge. OT will continue to follow acutely.    Recommendations for follow up therapy are one component of a multi-disciplinary discharge planning process, led by the attending physician.  Recommendations may be updated based on patient status, additional functional criteria and insurance authorization.   Follow Up Recommendations  Home health OT    Assistance Recommended at Discharge Frequent or constant Supervision/Assistance  Patient can return home with the following A little help with bathing/dressing/bathroom;Assistance with cooking/housework    Functional Status Assessment  Patient has had a recent decline in their functional status and demonstrates the ability to make significant improvements in function in a reasonable and predictable amount of time.   Equipment Recommendations  None recommended by OT    Recommendations for Other Services       Precautions / Restrictions Precautions Precautions: Fall Restrictions Weight Bearing Restrictions: No      Mobility Bed Mobility               General bed mobility comments: NT pt in recliner pre/post session    Transfers Overall transfer level: Needs assistance Equipment used: Rolling walker (2 wheels) Transfers: Sit to/from Stand Sit to Stand: Supervision                  Balance Overall balance assessment: Needs assistance Sitting-balance support: Feet supported Sitting balance-Leahy Scale: Good     Standing balance support: Bilateral upper extremity supported, During functional activity, Reliant on assistive device for balance Standing balance-Leahy Scale: Fair                             ADL either performed or assessed with clinical judgement   ADL Overall ADL's : Needs assistance/impaired Eating/Feeding: Set up;Sitting   Grooming: Set up;Sitting;Oral care               Lower Body Dressing: Minimal assistance   Toilet Transfer: Min guard;Ambulation;Rolling walker (2 wheels);Regular Toilet   Toileting- Clothing Manipulation and Hygiene: Supervision/safety;Sitting/lateral lean       Functional mobility during ADLs: Min guard;Rolling walker (2 wheels) (intermittent vcs for room set up) approx 20' 2 attempts with RW       Vision Baseline Vision/History: 3 Glaucoma Patient Visual Report: No change from baseline Additional Comments: pt reports worsening vision within the last few months     Perception     Praxis      Pertinent Vitals/Pain Pain Assessment  Pain Assessment: Faces Faces Pain Scale: Hurts a little bit Pain Location: generalized Pain Descriptors / Indicators: Aching Pain Intervention(s): Limited activity within patient's tolerance, Monitored during session     Hand Dominance Right   Extremity/Trunk  Assessment Upper Extremity Assessment Upper Extremity Assessment: Generalized weakness;RUE deficits/detail;LUE deficits/detail RUE Deficits / Details: mild impairments in FMC/dexterity due to numbness in B hand finger tips   Lower Extremity Assessment Lower Extremity Assessment: Generalized weakness       Communication Communication Communication: HOH   Cognition Arousal/Alertness: Awake/alert Behavior During Therapy: WFL for tasks assessed/performed Overall Cognitive Status: Within Functional Limits for tasks assessed                                       General Comments  intermittent vcs for safe RW use    Exercises Other Exercises Other Exercises: edu pt and daugther post session re: role of OT, discharge recommednations, home safety, med management, falls prevention   Shoulder Instructions      Home Living Family/patient expects to be discharged to:: Private residence Living Arrangements: Alone Available Help at Discharge: Family;Available PRN/intermittently (family checks on her daily) Type of Home: House Home Access: Stairs to enter [employer] of Steps: 2 Entrance Stairs-Rails: None Home Layout: One level     Bathroom Shower/Tub: Tub/shower unit;Sponge bathes at baseline   [employer]: Handicapped height Bathroom Accessibility: No   Home Equipment: Conservation officer, nature (2 wheels);Shower seat   Additional Comments: pt showers at daughter's home. Her home she takes sponge baths. Patient sleeps in the recliner due to left lower abdominal pain and also agrees it is easier to breathe.      Prior Functioning/Environment Prior Level of Function : Needs assist;Independent/Modified Independent             Mobility Comments: amb with RW ADLs Comments: MOD I dressing, toileting, grooming; family assist for bathing, assist from family for IADLs, pharmacy fills pill box for the month however family has been finding pills on the floor  recently        OT Problem List: Decreased strength;Decreased coordination;Decreased activity tolerance;Decreased knowledge of use of DME or AE      OT Treatment/Interventions: Self-care/ADL training;Patient/family education;Therapeutic exercise;Energy conservation;Therapeutic activities;DME and/or AE instruction    OT Goals(Current goals can be found in the care plan section) Acute Rehab OT Goals Patient Stated Goal: return to PLOF OT Goal Formulation: With patient Time For Goal Achievement: 08/15/22 Potential to Achieve Goals: Good ADL Goals Pt Will Perform Grooming: with modified independence;standing;sitting Pt Will Perform Lower Body Dressing: with modified independence;sitting/lateral leans;sit to/from stand Pt Will Transfer to Toilet: with modified independence Pt Will Perform Toileting - Clothing Manipulation and hygiene: with modified independence Additional ADL Goal #1: Pt will improve IADL of med management as evidenced by performing simulated medication administration task with MOD I  OT Frequency: Min 2X/week    Co-evaluation              AM-PAC OT "6 Clicks" Daily Activity     Outcome Measure Help from another person eating meals?: None Help from another person taking care of personal grooming?: None Help from another person toileting, which includes using toliet, bedpan, or urinal?: A Little Help from another person bathing (including washing, rinsing, drying)?: A Little Help from another person to put on and taking off regular upper body clothing?: A Little Help from another person to  put on and taking off regular lower body clothing?: A Little 6 Click Score: 20   End of Session Equipment Utilized During Treatment: Rolling walker (2 wheels) Nurse Communication: Mobility status (vital signs)  Activity Tolerance: Patient tolerated treatment well Patient left: in chair;with call bell/phone within reach;with family/visitor present  OT Visit Diagnosis:  Unsteadiness on feet (R26.81);Muscle weakness (generalized) (M62.81)                Time: 8250-5397 OT Time Calculation (min): 27 min Charges:  OT General Charges $OT Visit: 1 Visit OT Evaluation $OT Eval Moderate Complexity: 1 Mod  Shanon Payor, OTD OTR/L  08/01/22, 3:36 PM

## 2022-08-01 NOTE — TOC Progression Note (Signed)
Transition of Care [facility]) - Progression Note    Patient Details  Name: [name] MRN: [id number] Date of Birth: July 19, 1929  [facility]) CM/SW Contact  Izola Price, RN Phone Number: 08/01/2022, 2:53 PM  Clinical Narrative:   11/5: TOC for discharge planning, pending therapy evaluations. Simmie Davies RN CM          Expected Discharge Plan and Services                                                 Social Determinants of Health (SDOH) Interventions    Readmission Risk Interventions     No data to display

## 2022-08-01 NOTE — Progress Notes (Signed)
PROGRESS NOTE    [name]  JGG:[id number] DOB: [birth year] DOA: 07/30/2022 PCP: Jerrol Banana., MD  Assessment & Plan:   Principal Problem:   Acute on chronic combined systolic and diastolic CHF (congestive heart failure) (HCC) Active Problems:   Benign essential HTN   Chronic kidney disease   H/O deep venous thrombosis   Paroxysmal atrial fibrillation (HCC)   Type 2 diabetes mellitus with vascular disease (HCC)   PVD (peripheral vascular disease) ([place])   Lewy body dementia ([employer])   Celiac artery stenosis ([place])   Coronary artery disease involving native coronary artery of native heart   COPD (chronic obstructive pulmonary disease) (HCC)   Known medical problems   Elevated troponin   Pressure injury of skin  Assessment and Plan:  Acute on chronic combined CHF exacerbation: continue on IV lasix. Monitor I/Os. Neg approx 2.2L. Echo ordered but not done yet. Continue on losartan, imdur, coreg, statin, plavix, exetimibe  Hypokalemia: WNL today   DM2: fairly well controlled, HbA1c 6.9, could improve. Continue on aspart, glargine   Generalized weakness: PT/OT consulted   Depression: severity unknown. Continue on home dose of fluoxetine    Elevated troponins: likely secondary to demand ischemia  Lewy body dementia: continue on home dose of memantine, seroquel   GERD: continue on PPI   COPD: w/o exacerbation. Continue on bronchodilators    DVT prophylaxis: lovenox  Code Status:  full  Family Communication: discussed pt's care w/ pt's family at bedside and answered their questions  Disposition Plan: depends on PT/OT recs   Level of care: Telemetry Cardiac  Status is: Inpatient Remains inpatient appropriate because: severity of illness    Consultants:    Procedures:   Antimicrobials:    Subjective: Pt c/o malaise   Objective: Vitals:   08/01/22 0501 08/01/22 0542 08/01/22 0812 08/01/22 1101  BP: (!) 148/80  (!) 149/68 119/70  Pulse: 80   82 72  Resp: '18  17 17  '$ Temp: 97.8 F (36.6 C)  98.3 F (36.8 C) 98 F (36.7 C)  TempSrc:      SpO2: 100%  93% 100%  Weight:  56.6 kg    Height:        Intake/Output Summary (Last 24 hours) at 08/01/2022 1310 Last data filed at 08/01/2022 1104 Gross per 24 hour  Intake 480 ml  Output 2700 ml  Net -2220 ml   Filed Weights   07/30/22 2300 07/31/22 0157 08/01/22 0542  Weight: 57.4 kg 56.9 kg 56.6 kg    Examination:  General exam: Appears comfortable  Respiratory system: decreased breath sounds b/l  Cardiovascular system: S1/S2+. No rubs or clicks  Gastrointestinal system: Abd is soft, NT, ND & hypoactive bowel sounds Central nervous system: Alert and awake. Moves all extremities  Psychiatry: Judgement and insight appears at baseline. Flat mood and affect    Data Reviewed: I have personally reviewed following labs and imaging studies  CBC: Recent Labs  Lab 07/30/22 1606 07/31/22 0231 08/01/22 0816  WBC 12.3* 11.7* 10.8*  HGB 12.2 12.3 12.8  HCT 38.3 38.0 39.5  MCV 83.1 82.1 82.3  PLT 382 367 546   Basic Metabolic Panel: Recent Labs  Lab 07/30/22 1606 07/31/22 0231 08/01/22 0816  NA 136 135 137  K 3.5 3.0* 3.7  CL 100 96* 96*  CO2 25 32 29  GLUCOSE 245* 189* 149*  BUN '17 16 19  '$ CREATININE 0.89 0.79 1.06*  CALCIUM 8.8* 8.8* 8.8*   GFR: Estimated Creatinine Clearance:  27.3 mL/min (A) (by C-G formula based on SCr of 1.06 mg/dL (H)). Liver Function Tests: No results for input(s): "AST", "ALT", "ALKPHOS", "BILITOT", "PROT", "ALBUMIN" in the last 168 hours. No results for input(s): "LIPASE", "AMYLASE" in the last 168 hours. No results for input(s): "AMMONIA" in the last 168 hours. Coagulation Profile: No results for input(s): "INR", "PROTIME" in the last 168 hours. Cardiac Enzymes: No results for input(s): "CKTOTAL", "CKMB", "CKMBINDEX", "TROPONINI" in the last 168 hours. BNP (last 3 results) No results for input(s): "PROBNP" in the last 8760  hours. HbA1C: No results for input(s): "HGBA1C" in the last 72 hours. CBG: Recent Labs  Lab 07/31/22 1118 07/31/22 1652 07/31/22 2135 08/01/22 0813 08/01/22 1102  GLUCAP 132* 98 208* 145* 128*   Lipid Profile: No results for input(s): "CHOL", "HDL", "LDLCALC", "TRIG", "CHOLHDL", "LDLDIRECT" in the last 72 hours. Thyroid Function Tests: No results for input(s): "TSH", "T4TOTAL", "FREET4", "T3FREE", "THYROIDAB" in the last 72 hours. Anemia Panel: No results for input(s): "VITAMINB12", "FOLATE", "FERRITIN", "TIBC", "IRON", "RETICCTPCT" in the last 72 hours. Sepsis Labs: No results for input(s): "PROCALCITON", "LATICACIDVEN" in the last 168 hours.  No results found for this or any previous visit (from the past 240 hour(s)).       Radiology Studies: DG Chest 2 View  Result Date: 07/30/2022 CLINICAL DATA:  Shortness of breath since last night. EXAM: CHEST - 2 VIEW COMPARISON:  06/22/2022 FINDINGS: Stable enlarged cardiac silhouette. Calcified aortic arch. Stable mild prominence of the interstitial markings small amount of linear scarring or atelectasis in the left mid lung zone. Stable small bilateral pleural effusions. Mild-to-moderate peribronchial thickening. Midthoracic spine degenerative changes. Mild left glenohumeral joint degenerative changes. IMPRESSION: 1. No acute abnormality. 2. Stable cardiomegaly, mild chronic interstitial lung disease and possible mild interstitial pulmonary edema, small bilateral pleural effusions and mild to moderate bronchitic changes. Electronically Signed   By: Claudie Revering M.D.   On: 07/30/2022 16:49        Scheduled Meds:  aspirin  325 mg Oral Daily   atorvastatin  10 mg Oral Daily   carvedilol  6.25 mg Oral BID WC   clopidogrel  75 mg Oral Daily   [START ON 08/02/2022] enoxaparin (LOVENOX) injection  30 mg Subcutaneous Q24H   ezetimibe  10 mg Oral Daily   FLUoxetine  10 mg Oral TID   furosemide  40 mg Intravenous BID   insulin aspart  3  Units Subcutaneous TID WC   insulin glargine-yfgn  6 Units Subcutaneous QHS   isosorbide mononitrate  30 mg Oral Daily   losartan  100 mg Oral Daily   memantine  10 mg Oral BID   pantoprazole  40 mg Oral Daily   QUEtiapine  50 mg Oral QHS   sodium chloride flush  3 mL Intravenous Q12H   Continuous Infusions:   LOS: 2 days    Time spent: 30 mins     Wyvonnia Dusky, MD [facility] Pager [phone]  If 7PM-7AM, please contact night-coverage [email or web address] 08/01/2022, 1:10 PM

## 2022-08-01 NOTE — Evaluation (Addendum)
Physical Therapy Evaluation Patient Details Name: [name] MRN: [id number] DOB: August 14, 1929 Today's Date: 08/01/2022  History of Present Illness  Pt is a 88 year old female admitted with Acute on chronic combined CHF exacerbation; PMH significant for combined systolic and diastolic CHF, CKD, hypertension, paroxysmal A-fib, type 2 diabetes, Lewy body dementia, COPD, CAD, low vision.    Clinical Impression  Patient received in recliner with daughter [name] present. Patient alert and oriented and able to provide history with confirmation by Tammy. Patient lives alone and her two daughters and son check on her intermittently. She has a life alert type call button as well. Prior to hospitalization, she ambulated mod I with RW and denies any falls in the last 6 months. She is I with all ADLs except bathing, which she does at her daughter's homes with min A to set up assist. She gets helps from her daughters for meal prep and IADLs. She does not use oxygen at home. Upon PT evaluation, patient saturating between 88% and 92% at rest on room air. Her SpO2 dropped as low as 85% while ambulating but increased with standing rest and cuing for PLB or just cuing with PLB to up to 100%. She reported feeling short of breath and "congested." Bed mobility was not observed as she was already up in the chair. She transferred using RW with supervision and cuing for correct placement of RW, body, and hands. Without cuing she forgot to reach back to sit, push up to stand, and sometimes allowed the RW to get too far in front of her. She reports difficulty making it to the bathroom due to urinary incontinence which increases her risk for falls. Patient appears to have experienced a decrease in functional independence. Patient would benefit from short term rehab prior to returning home to improve her safety with body and AD positioning during mobility as she currently requires cuing to perform transfers and ambulation  safely. Low vision also increases her chances of falling without assistance. She could return home if she had 24/7 supervision, which is not currently available. PT also recommends a 3in1 BSC to use at night and make getting to the bathroom easier with decreased risk of falls during the night. Patient would benefit from skilled physical therapy to address impairments and functional limitations (see PT Problem List below) to work towards stated goals and return to PLOF or maximal functional independence.      Recommendations for follow up therapy are one component of a multi-disciplinary discharge planning process, led by the attending physician.  Recommendations may be updated based on patient status, additional functional criteria and insurance authorization.  Follow Up Recommendations Skilled nursing-short term rehab (<3 hours/day) Can patient physically be transported by private vehicle: Yes    Assistance Recommended at Discharge Frequent or constant Supervision/Assistance  Patient can return home with the following  A little help with walking and/or transfers;Assistance with cooking/housework;Assist for transportation;A little help with bathing/dressing/bathroom;Help with stairs or ramp for entrance    Equipment Recommendations BSC/3in1  Recommendations for Other Services       Functional Status Assessment Patient has had a recent decline in their functional status and demonstrates the ability to make significant improvements in function in a reasonable and predictable amount of time.     Precautions / Restrictions Precautions Precautions: Fall Restrictions Weight Bearing Restrictions: No      Mobility  Bed Mobility               General bed  mobility comments: Patient in recliner at start and end of session.    Transfers Overall transfer level: Needs assistance Equipment used: Rolling walker (2 wheels) Transfers: Sit to/from Stand Sit to Stand: Supervision            General transfer comment: cuing to reach back and push up with hands when transferring. Cuing to keep RW close to body.    Ambulation/Gait   Gait Distance (Feet): 220 Feet Assistive device: Rolling walker (2 wheels) Gait Pattern/deviations: Step-through pattern, Decreased stride length Gait velocity: slow     General Gait Details: Patient ambulated ~ 220 feet with RW and SBA for safety. She takes slow short steps and stopped voluntarily near end of ambulation due to fatigue. C/o B LE pain at posterior legs that she attributes to working out with PT at her HHPT session on friday and/or being in the bed too long. SpO2 dropped to 85% at times but also came up with standing break and PLB.  Stairs            Wheelchair Mobility    Modified Rankin (Stroke Patients Only)       Balance Overall balance assessment: Needs assistance Sitting-balance support: Feet supported Sitting balance-Leahy Scale: Good Sitting balance - Comments: steady sitting edge of chair.   Standing balance support: Bilateral upper extremity supported, During functional activity, Reliant on assistive device for balance Standing balance-Leahy Scale: Fair Standing balance comment: required B UE at RW during functional mobility                             Pertinent Vitals/Pain Pain Assessment Pain Assessment: 0-10 Pain Score: 5  Pain Location: bilateral legs when she exercised friday morning with HHPT. Pain Intervention(s): Limited activity within patient's tolerance, Monitored during session    Home Living Family/patient expects to be discharged to:: Private residence Living Arrangements: Alone Available Help at Discharge: Family;Available PRN/intermittently (family checks on her daily) Type of Home: House Home Access: Stairs to enter Entrance Stairs-Rails: None Entrance Stairs-Number of Steps: 2   Home Layout: One level Home Equipment: Conservation officer, nature (2 wheels);Shower  seat Additional Comments: pt showers at daughter's home. Her home she takes sponge baths. Patient sleeps in the recliner due to left lower abdominal pain and also agrees it is easier to breathe.    Prior Function Prior Level of Function : Needs assist;Independent/Modified Independent             Mobility Comments: amb with RW ADLs Comments: MOD I dressing, toileting, grooming; family assist for bathing, assist from family for IADLs, pharmacy fills pill box for the month however family has been finding pills on the floor recently     Hand Dominance   Dominant Hand: Right    Extremity/Trunk Assessment   Upper Extremity Assessment Upper Extremity Assessment: Defer to OT evaluation RUE Deficits / Details: mild impairments in FMC/dexterity due to numbness in B hand finger tips    Lower Extremity Assessment Lower Extremity Assessment: Generalized weakness    Cervical / Trunk Assessment Cervical / Trunk Assessment: Normal  Communication   Communication: HOH  Cognition Arousal/Alertness: Awake/alert Behavior During Therapy: WFL for tasks assessed/performed Overall Cognitive Status: Within Functional Limits for tasks assessed                                 General Comments: Patient has low vision but can  see shadows        General Comments General comments (skin integrity, edema, etc.): Patient breathing room air during session. At rest SpO2 fluxuates between 88% and 92%. During ambulation it dropped as low as 85% but returned to as high as 100% with cuing to release grip on RW and perform PLB.    Exercises Other Exercises Other Exercises: educated pt and daughter regarding role of PT in acute care setting, discharge reccomendations. Other Exercises: Patient practiced using toilet and washing hands in bathroom with supervision and cuing for hand placement during transfers. She was able perform her own pericare.   Assessment/Plan    PT Assessment Patient needs  continued PT services  PT Problem List Decreased strength;Decreased knowledge of use of DME;Decreased activity tolerance;Decreased knowledge of precautions;Decreased balance;Decreased mobility       PT Treatment Interventions DME instruction;Gait training;Stair training;Functional mobility training;Therapeutic activities;Therapeutic exercise;Balance training;Patient/family education;Neuromuscular re-education    PT Goals (Current goals can be found in the Care Plan section)  Acute Rehab PT Goals Patient Stated Goal: to get better PT Goal Formulation: With patient/family Time For Goal Achievement: 08/15/22 Potential to Achieve Goals: Good    Frequency Min 2X/week     Co-evaluation               AM-PAC PT "6 Clicks" Mobility  Outcome Measure Help needed turning from your back to your side while in a flat bed without using bedrails?: None Help needed moving from lying on your back to sitting on the side of a flat bed without using bedrails?: None Help needed moving to and from a bed to a chair (including a wheelchair)?: A Little Help needed standing up from a chair using your arms (e.g., wheelchair or bedside chair)?: A Little Help needed to walk in hospital room?: A Little Help needed climbing 3-5 steps with a railing? : A Little 6 Click Score: 20    End of Session Equipment Utilized During Treatment: Gait belt Activity Tolerance: Patient tolerated treatment well;Patient limited by pain;Patient limited by fatigue Patient left: in chair;with call bell/phone within reach;with chair alarm set;with family/visitor present Nurse Communication: Mobility status PT Visit Diagnosis: Difficulty in walking, not elsewhere classified (R26.2);Muscle weakness (generalized) (M62.81);Unsteadiness on feet (R26.81)    Time: 1503-1600 PT Time Calculation (min) (ACUTE ONLY): 57 min   Charges:   PT Evaluation $PT Eval Moderate Complexity: 1 Mod PT Treatments $Gait Training: 8-22  mins $Therapeutic Activity: 8-22 mins        Everlean Alstrom. Graylon Good, PT, DPT 08/01/22, 4:28 PM  [facility] [address] [place], [place] [address] P: [phone] I F: [phone]

## 2022-08-02 ENCOUNTER — Inpatient Hospital Stay (HOSPITAL_COMMUNITY)
Admit: 2022-08-02 | Discharge: 2022-08-02 | Disposition: A | Discharge status: Home / Self Care | Payer: Medicare Other | Attending: Family Medicine | Admitting: Family Medicine

## 2022-08-02 DIAGNOSIS — I5031 Acute diastolic (congestive) heart failure: Secondary | ICD-10-CM

## 2022-08-02 DIAGNOSIS — I5043 Acute on chronic combined systolic (congestive) and diastolic (congestive) heart failure: Secondary | ICD-10-CM | POA: Diagnosis not present

## 2022-08-02 DIAGNOSIS — F0390 Unspecified dementia without behavioral disturbance: Secondary | ICD-10-CM | POA: Diagnosis not present

## 2022-08-02 DIAGNOSIS — R531 Weakness: Secondary | ICD-10-CM | POA: Diagnosis not present

## 2022-08-02 LAB — ECHOCARDIOGRAM LIMITED
Area-P 1/2: 3.6 cm2
Calc EF: 43.3 %
Height: 62 in
S' Lateral: 3.5 cm
Single Plane A2C EF: 42.8 %
Single Plane A4C EF: 43.2 %
Weight: 1943.58 oz

## 2022-08-02 LAB — BASIC METABOLIC PANEL
Anion gap: 13 (ref 5–15)
BUN: 28 mg/dL — ABNORMAL HIGH (ref 8–23)
CO2: 29 mmol/L (ref 22–32)
Calcium: 8.6 mg/dL — ABNORMAL LOW (ref 8.9–10.3)
Chloride: 91 mmol/L — ABNORMAL LOW (ref 98–111)
Creatinine, Ser: 1.32 mg/dL — ABNORMAL HIGH (ref 0.44–1.00)
GFR, Estimated: 38 mL/min — ABNORMAL LOW (ref >60)
Glucose, Bld: 140 mg/dL — ABNORMAL HIGH (ref 70–99)
Potassium: 3 mmol/L — ABNORMAL LOW (ref 3.5–5.1)
Sodium: 133 mmol/L — ABNORMAL LOW (ref 135–145)

## 2022-08-02 LAB — CBC
HCT: 37.7 % (ref 36.0–46.0)
Hemoglobin: 12.5 g/dL (ref 12.0–15.0)
MCH: 26.9 pg (ref 26.0–34.0)
MCHC: 33.2 g/dL (ref 30.0–36.0)
MCV: 81.1 fL (ref 80.0–100.0)
Platelets: 334 10*3/uL (ref 150–400)
RBC: 4.65 MIL/uL (ref 3.87–5.11)
RDW: 22.5 % — ABNORMAL HIGH (ref 11.5–15.5)
WBC: 9.7 10*3/uL (ref 4.0–10.5)
nRBC: 0 % (ref 0.0–0.2)

## 2022-08-02 LAB — GLUCOSE, CAPILLARY
Glucose-Capillary: 131 mg/dL — ABNORMAL HIGH (ref 70–99)
Glucose-Capillary: 210 mg/dL — ABNORMAL HIGH (ref 70–99)
Glucose-Capillary: 284 mg/dL — ABNORMAL HIGH (ref 70–99)
Glucose-Capillary: 249 mg/dL — ABNORMAL HIGH (ref 70–99)

## 2022-08-02 MED ORDER — POTASSIUM CHLORIDE CRYS ER 20 MEQ PO TBCR
40.0000 meq | EXTENDED_RELEASE_TABLET | Freq: Two times a day (BID) | ORAL | Status: AC
  Administered 2022-08-02 – 2022-08-03 (×2): 40 meq via ORAL
  Filled 2022-08-02 (×2): qty 2

## 2022-08-02 MED ORDER — FUROSEMIDE 10 MG/ML IJ SOLN
40.0000 mg | Freq: Every day | INTRAMUSCULAR | Status: DC
  Administered 2022-08-02 – 2022-08-03 (×2): 40 mg via INTRAVENOUS
  Filled 2022-08-02 (×2): qty 4

## 2022-08-02 NOTE — Consult Note (Signed)
   Heart Failure Nurse Navigator Note  HfmrEF 45 to 50%.  Mild LVH.  Determinate diastolic dysfunction.  Right ventricular systolic function is mildly reduced.  Mild mitral regurgitation.  Mild left atrial enlargement.  She presented to the emergency room feeling more short of breath than normal.  BNP elevated at 1821.  Chest x-ray with mild interstitial edema.  Small bilateral pleural effusions.  Comorbidities:  Coronary artery disease Paroxysmal atrial fibrillation not on anticoagulation Hypertension Hyperlipidemia Insulin-dependent diabetes Peripheral vascular disease COPD Chronic kidney disease stage III Anemia Lewy body dementia  Medications:  Aspirin 325 daily Atorvastatin 10 mg daily Carvedilol 6.25 mg 2 times daily with meals Plavix 75 mg daily Setia 10 mg daily Furosemide 40 mg IV daily Isosorbide mononitrate 30 mg daily Losartan 100 mg daily Potassium chloride 40 mEq 2 times a day  Labs:  Sodium 133, potassium 3, chloride 91, CO2 content 29, BUN 28, creatinine 1.32 up from 1.06 of yesterday, GFR 38 Weight is 55.1 kg Intake 363 mL Output 2450 mL   Initial meeting with patient on this admission.  No family members present at the bedside.  She states that she lives at home by herself and family brings her in her meals.  She has a talking scale and weighs herself every day and had not seen a weight gain.  Went over fluid restriction, patient felt she did not drink any more than 4-8 ounce cups daily.  She states that she had been compliant with her medications.  She has follow up in the heart failure clinic on November 15 at [place]

## 2022-08-02 NOTE — Progress Notes (Addendum)
Occupational Therapy Treatment Patient Details Name: [name] MRN: [id number] DOB: April 27, 1929 Today's Date: 08/02/2022   History of present illness Pt is a 88 year old female admitted with Acute on chronic combined CHF exacerbation; PMH significant for combined systolic and diastolic CHF, CKD, hypertension, paroxysmal A-fib, type 2 diabetes, Lewy body dementia, COPD, CAD   OT comments  Ms. Petrak is making good progress toward her functional goals.  She was pleasant and agreeable to OT treatment.  Patient able to perform bed mobility with mod I.  OT provided min guard assist for sit to stand transfer with rolling walker, but patient declined further standing/ambulation 2/2 pain in calves.  Per PT report directly after session, patient demonstrated improved ability to maintain balance and ambulate with RW.  OT provided setup assist for patient to perform grooming tasks while seated EOB.  Patient with good dynamic sitting balance throughout task.  She will continue to benefit from skilled OT services in acute setting to support functional strengthening, safety and independence in ADLs.  Discharge recommendations remain appropriate if patient is able to receive assistance for medication management.   Recommendations for follow up therapy are one component of a multi-disciplinary discharge planning process, led by the attending physician.  Recommendations may be updated based on patient status, additional functional criteria and insurance authorization.    Follow Up Recommendations  Home health OT    Assistance Recommended at Discharge Frequent or constant Supervision/Assistance  Patient can return home with the following  A little help with bathing/dressing/bathroom;Assistance with cooking/housework   Equipment Recommendations  None recommended by OT    Recommendations for Other Services      Precautions / Restrictions Precautions Precautions: Fall Restrictions Weight Bearing  Restrictions: No       Mobility Bed Mobility Overal bed mobility: Modified Independent Bed Mobility: Supine to Sit     Supine to sit: Modified independent (Device/Increase time)     General bed mobility comments: use of rail, HOB elevated Patient Response: Cooperative  Transfers Overall transfer level: Needs assistance Equipment used: Rolling walker (2 wheels) Transfers: Sit to/from Stand Sit to Stand: Min guard                 Balance Overall balance assessment: Needs assistance Sitting-balance support: No upper extremity supported, Feet supported Sitting balance-Leahy Scale: Good Sitting balance - Comments: steady sitting reaching within BOS   Standing balance support: Bilateral upper extremity supported, During functional activity, Reliant on assistive device for balance Standing balance-Leahy Scale: Fair                             ADL either performed or assessed with clinical judgement   ADL Overall ADL's : Needs assistance/impaired Eating/Feeding: Set up;Sitting   Grooming: Set up;Sitting;Oral care                               Functional mobility during ADLs: Minimal assistance;Rolling walker (2 wheels)      Extremity/Trunk Assessment Upper Extremity Assessment Upper Extremity Assessment: Generalized weakness RUE Deficits / Details: mild impairments in FMC/dexterity due to numbness in B hand finger tips   Lower Extremity Assessment Lower Extremity Assessment: Generalized weakness        Vision Baseline Vision/History: 3 Glaucoma Patient Visual Report: No change from baseline     Perception     Praxis      Cognition Arousal/Alertness:  Awake/alert Behavior During Therapy: WFL for tasks assessed/performed Overall Cognitive Status: Within Functional Limits for tasks assessed                                 General Comments: Patient has low vision but can see shadows        Exercises Other  Exercises Other Exercises: provided education re: fall and safety precautions, transfer technique, assist for self care    Shoulder Instructions       General Comments      Pertinent Vitals/ Pain       Pain Assessment Pain Assessment: Faces Faces Pain Scale: Hurts little more Pain Location: reports pain in calves, knees Pain Descriptors / Indicators: Cramping, Guarding Pain Intervention(s): Limited activity within patient's tolerance, Monitored during session  Home Living                                          Prior Functioning/Environment              Frequency  Min 2X/week        Progress Toward Goals  OT Goals(current goals can now be found in the care plan section)  Progress towards OT goals: Progressing toward goals  Acute Rehab OT Goals Patient Stated Goal: return to active lifestyle OT Goal Formulation: With patient Time For Goal Achievement: 08/15/22 Potential to Achieve Goals: Good  Plan Discharge plan remains appropriate;Frequency remains appropriate    Co-evaluation                 AM-PAC OT "6 Clicks" Daily Activity     Outcome Measure   Help from another person eating meals?: None Help from another person taking care of personal grooming?: None Help from another person toileting, which includes using toliet, bedpan, or urinal?: A Little Help from another person bathing (including washing, rinsing, drying)?: A Little Help from another person to put on and taking off regular upper body clothing?: A Little Help from another person to put on and taking off regular lower body clothing?: A Little 6 Click Score: 20    End of Session Equipment Utilized During Treatment: Rolling walker (2 wheels);Gait belt  OT Visit Diagnosis: Unsteadiness on feet (R26.81);Muscle weakness (generalized) (M62.81)   Activity Tolerance Patient tolerated treatment well   Patient Left with call bell/phone within reach;in bed (with PT in  room)   Nurse Communication          Time: 1779-3903 OT Time Calculation (min): 25 min  Charges: OT General Charges $OT Visit: 1 Visit OT Treatments $Self Care/Home Management : 23-37 mins  Jeneen Montgomery, OTR/L 08/02/22, 1:00 PM

## 2022-08-02 NOTE — TOC Initial Note (Addendum)
Transition of Care [facility]) - Initial/Assessment Note    Patient Details  Name: [name] MRN: [id number] Date of Birth: [birth year]  Transition of Care [facility]) CM/SW Contact:    Candie Chroman, LCSW Phone Number: 08/02/2022, 1:10 PM  Clinical Narrative:  Readmission prevention screen complete. CSW met with patient. No supports at bedside. CSW introduced role and explained that discharge planning would be discussed. PCP is Miguel Aschoff, MD. Son transports her to appointments. She uses [facility]. No issues obtaining medications. She was active with [facility] for PT and RN prior to admission. Liaison confirmed they can add OT. Patient uses a RW to get around at home. She is not on oxygen at home and is aware she doesn't qualify for insurance coverage. She gave CSW permission to call her son. Made him aware that PT changed recommendations from SNF to home health. Discussed home oxygen. Asked [employer] to call him to discuss private pay amounts. Depending on cost, he may be willing to private pay since it makes patient feel better. No further concerns. CSW encouraged patient and her son to contact CSW as needed. CSW will continue to follow patient and her son for support and facilitate return home once stable.            3:00 pm: Per [employer] liaison, monthly private pay cost for oxygen is over $250 so since patient has fixed income they will not be able to manage that.    Expected Discharge Plan: [place] Barriers to Discharge: Continued Medical Work up   Patient Goals and CMS Choice     Choice offered to / list presented to : NA  Expected Discharge Plan and Services Expected Discharge Plan: [place] Acute Care Choice: Resumption of Svcs/PTA Provider Living arrangements for the past 2 months: Single Family Home                           HH Arranged: RN, PT, OT [facility] Agency: [place] Date [facility] Agency  Contacted: 08/02/22   Representative spoke with at [place]: Adela Lank  Prior Living Arrangements/Services Living arrangements for the past 2 months: [place] Lives with:: Self Patient language and need for interpreter reviewed:: Yes Do you feel safe going back to the place where you live?: Yes      Need for Family Participation in Patient Care: Yes (Comment) Care giver support system in place?: Yes (comment) Current home services: DME, Home PT, Home RN Criminal Activity/Legal Involvement Pertinent to Current Situation/Hospitalization: No - Comment as needed  Activities of Daily Living Home Assistive Devices/Equipment: Walker (specify type) ADL Screening (condition at time of admission) Patient's cognitive ability adequate to safely complete daily activities?: Yes Is the patient deaf or have difficulty hearing?: Yes Does the patient have difficulty seeing, even when wearing glasses/contacts?: Yes Does the patient have difficulty concentrating, remembering, or making decisions?: No Patient able to express need for assistance with ADLs?: Yes Does the patient have difficulty dressing or bathing?: No Independently performs ADLs?: No Communication: Independent Dressing (OT): Independent Grooming: Needs assistance Is this a change from baseline?: Change from baseline, expected to last <3 days Feeding: Independent Bathing: Independent Toileting: Needs assistance Is this a change from baseline?: Change from baseline, expected to last <3 days In/Out Bed: Needs assistance Is this a change from baseline?: Change from baseline, expected to last <  3 days Walks in Home: Needs assistance Is this a change from baseline?: Change from baseline, expected to last <3 days Does the patient have difficulty walking or climbing stairs?: No Weakness of Legs: Both Weakness of Arms/Hands: Both  Permission Sought/Granted Permission sought to share information with : Facility Automotive engineer, Family Supports Permission granted to share information with : Yes, Verbal Permission Granted  Share Information with NAME: Jentry Mcqueary  Permission granted to share info w AGENCY: [place] granted to share info w Relationship: Son  Permission granted to share info w Contact Information: [phone]  Emotional Assessment Appearance:: Appears stated age Attitude/Demeanor/Rapport: Engaged, Gracious Affect (typically observed): Accepting, Appropriate, Calm, Pleasant Orientation: : Oriented to Self, Oriented to Place, Oriented to  Time, Oriented to Situation Alcohol / Substance Use: Not Applicable Psych Involvement: No (comment)  Admission diagnosis:  Acute respiratory failure with hypoxia (HCC) [J96.01] Acute on chronic diastolic CHF (congestive heart failure) (HCC) [I50.33] Acute on chronic combined systolic and diastolic CHF (congestive heart failure) (HCC) [I50.43] Patient Active Problem List   Diagnosis Date Noted   Pressure injury of skin 07/31/2022   Acute on chronic combined systolic and diastolic CHF (congestive heart failure) ([place]) 07/30/2022   Known medical problems 07/30/2022   Elevated troponin 07/30/2022   Iron deficiency anemia 06/21/2022   Community acquired pneumonia    CHF (congestive heart failure) ([place]) 06/15/2022   Pain of left hip joint 03/08/2022   History of falling 07/14/2021   COPD (chronic obstructive pulmonary disease) ([place]) 07/14/2021   Neurocognitive disorder with Lewy bodies (CODE) ([place]) 07/07/2021   Atherosclerosis of native arteries of the extremities with ulceration ([place]) 04/07/2021   PSVT (paroxysmal supraventricular tachycardia) 34/19/3790   Acute metabolic encephalopathy 24/05/7352   Frequent falls 02/10/2021   COVID-19 virus infection 02/10/2021   Chronic kidney disease, stage 3a ([place]) 02/10/2021   Dementia ([place]) 02/10/2021   Garbled speech, intermittent 02/10/2021   Laceration without foreign body,  left ankle, subsequent encounter 02/10/2021   AMS (altered mental status) 02/10/2021   Osteoarthritis of left shoulder 10/27/2020   Type 2 diabetes mellitus with diabetic peripheral angiopathy without gangrene ([place]) 09/27/2020   Personal history of malignant melanoma of skin 09/27/2020   Long term (current) use of insulin ([place]) 09/27/2020   Coronary artery disease involving native coronary artery of native heart 04/17/2020   Celiac artery stenosis ([place]) 06/12/2019   Bilateral lower abdominal cramping 04/20/2019   UTI symptoms 04/20/2019   History of rectal bleeding 03/26/2019   DM type 2 with diabetic peripheral neuropathy ([place]) 12/14/2018   Arthropathy of lumbar facet joint 09/04/2018   Degeneration of lumbar intervertebral disc 09/04/2018   Spondylolisthesis, grade 1 09/04/2018   Lewy body dementia ([place]) 02/17/2018   PVD (peripheral vascular disease) ([place]) 08/16/2017   Pain in limb 07/27/2016   Hallucinations 07/07/2016   Chest pain 07/01/2016   Depression 06/23/2016   Hypokalemia 09/03/2015   Insomnia 07/23/2015   Headache 07/23/2015   Type 2 diabetes mellitus with vascular disease ([place]) 06/24/2015   Chronic vulvitis 06/02/2015   Allergic rhinitis 05/30/2015   Anemia 05/30/2015   Back ache 05/30/2015   Body mass index (BMI) of 29.0-29.9 in adult 05/30/2015   Edema 05/30/2015   Dilatation of esophagus 05/30/2015   Fall 05/30/2015   H/O deep venous thrombosis 05/30/2015   Hypercholesteremia 05/30/2015   Heart & renal disease, hypertensive, with heart failure ([place]) 05/30/2015   Hyponatremia 05/30/2015   Calculus of kidney 05/30/2015   Gonalgia  05/30/2015   Psoriasis 05/30/2015   Avitaminosis D 05/30/2015   Cough 04/01/2015   Combined fat and carbohydrate induced hyperlipemia 12/30/2014   Paroxysmal atrial fibrillation (HCC) 07/10/2014   Abnormal gait 07/08/2014   Anxiety state 07/08/2014   Chronic kidney disease 07/08/2014   Acid reflux 07/08/2014   Cutaneous malignant  melanoma ([place]) 07/08/2014   COPD exacerbation ([place]) 07/08/2014   Type II diabetes mellitus with renal manifestations ([place]) 07/08/2014   Benign essential HTN 06/26/2014   Carotid artery narrowing 06/26/2014   Myalgia 06/26/2014   Basal cell carcinoma of face 05/01/2014   Disease of female genital organs 11/28/2012   Incomplete bladder emptying 09/05/2012   Excessive urination at night 08/15/2012   Basal cell carcinoma of ear 10/26/2011   PCP:  Jerrol Banana., MD Pharmacy:   [place], [place] - [address] [place] [address] Phone: [phone] Fax: [phone], [facility] - [place], [place] [address] [place] [address] Phone: [phone] Fax: [phone]     Social Determinants of Health (SDOH) Interventions    Readmission Risk Interventions    08/02/2022    1:07 PM  Readmission Risk Prevention Plan  Transportation Screening Complete  Medication Review ([place]) Complete  PCP or Specialist appointment within 3-5 days of discharge Complete  HRI or [place] Complete  SW Recovery Care/Counseling Consult Complete  [place] Not Applicable

## 2022-08-02 NOTE — Progress Notes (Addendum)
PROGRESS NOTE    [name]  NFA:[id number] DOB: 1929-06-15 DOA: 07/30/2022 PCP: Jerrol Banana., MD  Assessment & Plan:   Principal Problem:   Acute on chronic combined systolic and diastolic CHF (congestive heart failure) (HCC) Active Problems:   Benign essential HTN   Chronic kidney disease   H/O deep venous thrombosis   Paroxysmal atrial fibrillation (HCC)   Type 2 diabetes mellitus with vascular disease (HCC)   PVD (peripheral vascular disease) ([place])   Lewy body dementia ([place])   Celiac artery stenosis ([place])   Coronary artery disease involving native coronary artery of native heart   COPD (chronic obstructive pulmonary disease) (HCC)   Known medical problems   Elevated troponin   Pressure injury of skin  Assessment and Plan:  Acute on chronic combined CHF exacerbation: decreased IV lasix frequency secondary to Cr trending up. Monitor I/Os. Neg approx 2.1L. Repeat echo shows EF 45-50%, grade II diastolic dysfunction, LV demonstrates global hypokinesis, very similar to previous echo. Continue on coreg, losartan, imdur, statin, plavix & exetimibe   Hypokalemia: potassium given   DM2: HbA1c 6.9, fairly well controlled. Continue glargine, aspart   Likely CKD: baseline Cr/GFR is unknown, currently stage IIIb. Cr is labile.   Generalized weakness: PT initially recommended SNF but on re-evaluation recs HH. HH orders placed  Depression: severity unknown. Continue on home dose of fluoxetine   Elevated troponins: likely secondary to demand ischemia  Lewy body dementia: continue on seroquel, memantine   GERD: continue on PPI   COPD: w/o exacerbation. Continue on bronchodilators  Pressure injury coccyx:  stage 1, present on admission. Continue w/ wound care  DVT prophylaxis: lovenox  Code Status:  full  Family Communication: discussed pt's care w/ pt's family at bedside and answered their questions  Disposition Plan: likely d/c back home w/ home health    Level of care: Telemetry Cardiac  Status is: Inpatient Remains inpatient appropriate because: severity of illness    Consultants:    Procedures:   Antimicrobials:    Subjective: Pt feels better when using supplemental oxygen despite her not needed it   Objective: Vitals:   08/02/22 0346 08/02/22 0500 08/02/22 0825 08/02/22 1258  BP: 117/63  136/63 (!) 114/55  Pulse: 75  71 75  Resp: '16  14 14  '$ Temp: 97.9 F (36.6 C)  97.7 F (36.5 C) (!) 97.5 F (36.4 C)  TempSrc:   Oral   SpO2: (!) 89%  99% 97%  Weight:  55.1 kg    Height:        Intake/Output Summary (Last 24 hours) at 08/02/2022 1333 Last data filed at 08/02/2022 0907 Gross per 24 hour  Intake 123 ml  Output 2300 ml  Net -2177 ml   Filed Weights   07/31/22 0157 08/01/22 0542 08/02/22 0500  Weight: 56.9 kg 56.6 kg 55.1 kg    Examination:  General exam: Appears calm & comfortable  Respiratory system: diminished breath sounds b/l otherwise clear  Cardiovascular system: S1 & S2+. No rubs or gallops  Gastrointestinal system: abd is soft, NT, ND & normal bowel sounds Central nervous system: Alert and awake. Moves all extremities  Psychiatry: Judgement and insight appears at baseline. Flat mood and affect     Data Reviewed: I have personally reviewed following labs and imaging studies  CBC: Recent Labs  Lab 07/30/22 1606 07/31/22 0231 08/01/22 0816 08/02/22 0406  WBC 12.3* 11.7* 10.8* 9.7  HGB 12.2 12.3 12.8 12.5  HCT 38.3 38.0 39.5  37.7  MCV 83.1 82.1 82.3 81.1  PLT 382 367 361 878   Basic Metabolic Panel: Recent Labs  Lab 07/30/22 1606 07/31/22 0231 08/01/22 0816 08/02/22 0406  NA 136 135 137 133*  K 3.5 3.0* 3.7 3.0*  CL 100 96* 96* 91*  CO2 25 32 29 29  GLUCOSE 245* 189* 149* 140*  BUN '17 16 19 '$ 28*  CREATININE 0.89 0.79 1.06* 1.32*  CALCIUM 8.8* 8.8* 8.8* 8.6*   GFR: Estimated Creatinine Clearance: 22 mL/min (A) (by C-G formula based on SCr of 1.32 mg/dL (H)). Liver Function  Tests: No results for input(s): "AST", "ALT", "ALKPHOS", "BILITOT", "PROT", "ALBUMIN" in the last 168 hours. No results for input(s): "LIPASE", "AMYLASE" in the last 168 hours. No results for input(s): "AMMONIA" in the last 168 hours. Coagulation Profile: No results for input(s): "INR", "PROTIME" in the last 168 hours. Cardiac Enzymes: No results for input(s): "CKTOTAL", "CKMB", "CKMBINDEX", "TROPONINI" in the last 168 hours. BNP (last 3 results) No results for input(s): "PROBNP" in the last 8760 hours. HbA1C: No results for input(s): "HGBA1C" in the last 72 hours. CBG: Recent Labs  Lab 08/01/22 1102 08/01/22 1633 08/01/22 2106 08/02/22 0855 08/02/22 1255  GLUCAP 128* 249* 206* 131* 210*   Lipid Profile: No results for input(s): "CHOL", "HDL", "LDLCALC", "TRIG", "CHOLHDL", "LDLDIRECT" in the last 72 hours. Thyroid Function Tests: No results for input(s): "TSH", "T4TOTAL", "FREET4", "T3FREE", "THYROIDAB" in the last 72 hours. Anemia Panel: No results for input(s): "VITAMINB12", "FOLATE", "FERRITIN", "TIBC", "IRON", "RETICCTPCT" in the last 72 hours. Sepsis Labs: No results for input(s): "PROCALCITON", "LATICACIDVEN" in the last 168 hours.  No results found for this or any previous visit (from the past 240 hour(s)).       Radiology Studies: ECHOCARDIOGRAM LIMITED  Result Date: 08/02/2022    ECHOCARDIOGRAM LIMITED REPORT   Patient Name:   [name] Date of Exam: 08/02/2022 Medical Rec #:  [id number]            Height:       62.0 in Accession #:    [id number]           Weight:       121.5 lb Date of Birth:  [birth year]            BSA:          1.547 m Patient Age:    88 years             BP:           136/63 mmHg Patient Gender: F                    HR:           82 bpm. Exam Location:  [facility] Procedure: Limited Echo, Limited Color Doppler, Cardiac Doppler and 3D Echo Indications:     CHF-Acute Diastolic U76.54  History:         Patient has prior history of Echocardiogram  examinations, most                  recent 06/16/2022. CHF, COPD, Arrythmias:Atrial Fibrillation;                  Risk Factors:Hypertension, Diabetes and Dyslipidemia.  Sonographer:     Mikki Santee RDCS Referring Phys:  [id number] [place] M ECKSTAT Diagnosing Phys: Kathlyn Sacramento MD IMPRESSIONS  1. Left ventricular ejection fraction, by estimation, is 45 to 50%. The left ventricle has mildly decreased function. The  left ventricle demonstrates global hypokinesis. There is mild left ventricular hypertrophy. Left ventricular diastolic parameters are consistent with Grade II diastolic dysfunction (pseudonormalization).  2. Right ventricular systolic function is normal. The right ventricular size is normal.  3. Left atrial size was mildly dilated.  4. The mitral valve is normal in structure. Moderate mitral valve regurgitation. No evidence of mitral stenosis.  5. The aortic valve is normal in structure. Aortic valve regurgitation is not visualized. Aortic valve sclerosis/calcification is present, without any evidence of aortic stenosis.  6. The inferior vena cava is normal in size with greater than 50% respiratory variability, suggesting right atrial pressure of 3 mmHg. FINDINGS  Left Ventricle: Left ventricular ejection fraction, by estimation, is 45 to 50%. The left ventricle has mildly decreased function. The left ventricle demonstrates global hypokinesis. The left ventricular internal cavity size was normal in size. There is  mild left ventricular hypertrophy. Left ventricular diastolic parameters are consistent with Grade II diastolic dysfunction (pseudonormalization). Right Ventricle: The right ventricular size is normal. No increase in right ventricular wall thickness. Right ventricular systolic function is normal. Left Atrium: Left atrial size was mildly dilated. Right Atrium: Right atrial size was normal in size. Pericardium: There is no evidence of pericardial effusion. Mitral Valve: The mitral valve is  normal in structure. Moderate mitral valve regurgitation. No evidence of mitral valve stenosis. Tricuspid Valve: The tricuspid valve is normal in structure. Tricuspid valve regurgitation is not demonstrated. No evidence of tricuspid stenosis. Aortic Valve: The aortic valve is normal in structure. Aortic valve regurgitation is not visualized. Aortic valve sclerosis/calcification is present, without any evidence of aortic stenosis. Pulmonic Valve: The pulmonic valve was normal in structure. Pulmonic valve regurgitation is not visualized. No evidence of pulmonic stenosis. Aorta: The aortic root is normal in size and structure. Venous: The inferior vena cava is normal in size with greater than 50% respiratory variability, suggesting right atrial pressure of 3 mmHg. IAS/Shunts: No atrial level shunt detected by color flow Doppler. LEFT VENTRICLE PLAX 2D LVIDd:         4.50 cm     Diastology LVIDs:         3.50 cm     LV e' medial:    3.45 cm/s LV PW:         1.10 cm     LV E/e' medial:  25.5 LV IVS:        1.00 cm     LV e' lateral:   3.75 cm/s                            LV E/e' lateral: 23.5  LV Volumes (MOD) LV vol d, MOD A2C: 71.7 ml LV vol d, MOD A4C: 74.5 ml LV vol s, MOD A2C: 41.0 ml 3D Volume EF: LV vol s, MOD A4C: 42.3 ml 3D EF:        45 % LV SV MOD A2C:     30.7 ml LV EDV:       137 ml LV SV MOD A4C:     74.5 ml LV ESV:       75 ml LV SV MOD BP:      31.9 ml LV SV:        62 ml LEFT ATRIUM         Index LA diam:    2.80 cm 1.81 cm/m  AORTIC VALVE LVOT Vmax:   73.90 cm/s LVOT Vmean:  48.200  cm/s LVOT VTI:    0.142 m MITRAL VALVE MV Area (PHT): 3.60 cm    SHUNTS MV Decel Time: 211 msec    Systemic VTI: 0.14 m MV E velocity: 88.00 cm/s MV A velocity: 81.80 cm/s MV E/A ratio:  1.08 Kathlyn Sacramento MD Electronically signed by Kathlyn Sacramento MD Signature Date/Time: 08/02/2022/12:42:32 PM    Final         Scheduled Meds:  aspirin  325 mg Oral Daily   atorvastatin  10 mg Oral Daily   carvedilol  6.25 mg Oral  BID WC   clopidogrel  75 mg Oral Daily   enoxaparin (LOVENOX) injection  30 mg Subcutaneous Q24H   ezetimibe  10 mg Oral Daily   FLUoxetine  10 mg Oral TID   furosemide  40 mg Intravenous Daily   insulin aspart  3 Units Subcutaneous TID WC   insulin glargine-yfgn  6 Units Subcutaneous QHS   isosorbide mononitrate  30 mg Oral Daily   losartan  100 mg Oral Daily   memantine  10 mg Oral BID   pantoprazole  40 mg Oral Daily   potassium chloride  40 mEq Oral BID   QUEtiapine  50 mg Oral QHS   sodium chloride flush  3 mL Intravenous Q12H   Continuous Infusions:   LOS: 3 days    Time spent: 30 mins     Wyvonnia Dusky, MD [facility] Pager [phone]  If 7PM-7AM, please contact night-coverage [email or web address] 08/02/2022, 1:33 PM

## 2022-08-02 NOTE — Plan of Care (Signed)

## 2022-08-02 NOTE — Progress Notes (Signed)
Physical Therapy Treatment Patient Details Name: [name] MRN: [id number] DOB: December 22, 1928 Today's Date: 08/02/2022   History of Present Illness Pt is a 88 year old female admitted with Acute on chronic combined CHF exacerbation; PMH significant for combined systolic and diastolic CHF, CKD, hypertension, paroxysmal A-fib, type 2 diabetes, Lewy body dementia, COPD, CAD    PT Comments    Pt sitting on edge of bed (finishing OT session) upon PT arrival; pt agreeable to more therapy.  During session pt SBA with transfers using RW and CGA to SBA ambulating 130 feet with RW use (limited per pt d/t fatigue).  O2 sats 95% or greater on 2 L O2 via nasal cannula during sessions activities; HR WFL.  Pt reporting B LE pain (mostly "arthritic knees" from laying in bed too much--pt reports being more active at home) which improved with mobility.  Pt required extra time (without any cueing) but able to figure out safe hand positioning/placement for transfers.  Anticipate pt will do better within her own home (pt with low vision but familiar with furniture and surroundings) with support and check-ins from family.  PT recommendations updated to HHPT.   Recommendations for follow up therapy are one component of a multi-disciplinary discharge planning process, led by the attending physician.  Recommendations may be updated based on patient status, additional functional criteria and insurance authorization.  Follow Up Recommendations  Home health PT Can patient physically be transported by private vehicle: Yes   Assistance Recommended at Discharge Frequent or constant Supervision/Assistance  Patient can return home with the following A little help with walking and/or transfers;Assistance with cooking/housework;Assist for transportation;A little help with bathing/dressing/bathroom;Help with stairs or ramp for entrance   Equipment Recommendations  BSC/3in1;Rolling walker (2 wheels)    Recommendations  for Other Services       Precautions / Restrictions Precautions Precautions: Fall Restrictions Weight Bearing Restrictions: No     Mobility  Bed Mobility               General bed mobility comments: Deferred (pt sitting on edge of bed finishing with OT upon PT arrival)    Transfers Overall transfer level: Needs assistance Equipment used: Rolling walker (2 wheels) Transfers: Sit to/from Stand Sit to Stand: Supervision           General transfer comment: increased time to perform on own but pt able to safely find good hand placement with extra time    Ambulation/Gait Ambulation/Gait assistance: Min guard, Supervision Gait Distance (Feet): 130 Feet Assistive device: Rolling walker (2 wheels) Gait Pattern/deviations: Step-through pattern, Decreased stride length Gait velocity: decreased     General Gait Details: steady with RW use; partial step through gait pattern; limited distance d/t fatigue   Stairs             Wheelchair Mobility    Modified Rankin (Stroke Patients Only)       Balance Overall balance assessment: Needs assistance Sitting-balance support: No upper extremity supported, Feet supported Sitting balance-Leahy Scale: Good Sitting balance - Comments: steady sitting reaching within BOS   Standing balance support: Bilateral upper extremity supported, During functional activity, Reliant on assistive device for balance Standing balance-Leahy Scale: Good Standing balance comment: steady ambulating with RW use                            Cognition Arousal/Alertness: Awake/alert Behavior During Therapy: WFL for tasks assessed/performed Overall Cognitive Status: Within Functional Limits for  tasks assessed                                 General Comments: Patient has low vision but can see shadows        Exercises      General Comments  Nursing cleared pt for participation in physical therapy.  Pt agreeable  to PT session.      Pertinent Vitals/Pain Pain Assessment Pain Assessment: Faces Faces Pain Scale: Hurts little more Pain Location: LE's ("arthritic knees") Pain Descriptors / Indicators: Aching Pain Intervention(s): Limited activity within patient's tolerance, Monitored during session, Repositioned    Home Living                          Prior Function            PT Goals (current goals can now be found in the care plan section) Acute Rehab PT Goals Patient Stated Goal: to get better PT Goal Formulation: With patient Time For Goal Achievement: 08/15/22 Potential to Achieve Goals: Good Progress towards PT goals: Progressing toward goals    Frequency    Min 2X/week      PT Plan Discharge plan needs to be updated    Co-evaluation              AM-PAC PT "6 Clicks" Mobility   Outcome Measure  Help needed turning from your back to your side while in a flat bed without using bedrails?: None Help needed moving from lying on your back to sitting on the side of a flat bed without using bedrails?: None Help needed moving to and from a bed to a chair (including a wheelchair)?: A Little Help needed standing up from a chair using your arms (e.g., wheelchair or bedside chair)?: A Little Help needed to walk in hospital room?: A Little Help needed climbing 3-5 steps with a railing? : A Little 6 Click Score: 20    End of Session Equipment Utilized During Treatment: Gait belt;Oxygen (2 L O2 via nasal cannula) Activity Tolerance: Patient limited by fatigue Patient left: in chair;with call bell/phone within reach;with chair alarm set Nurse Communication: Mobility status PT Visit Diagnosis: Difficulty in walking, not elsewhere classified (R26.2);Muscle weakness (generalized) (M62.81);Unsteadiness on feet (R26.81)     Time: 6629-4765 PT Time Calculation (min) (ACUTE ONLY): 16 min  Charges:  $Therapeutic Exercise: 8-22 mins                     Leitha Bleak,  PT 08/02/22, 12:15 PM

## 2022-08-02 NOTE — Care Management Important Message (Signed)
Important Message  Patient Details  Name: [name] MRN: [id number] Date of Birth: 03-10-1929   Medicare Important Message Given:  Yes     [name] 08/02/2022, 12:58 PM

## 2022-08-03 LAB — CBC
HCT: 37.4 % (ref 36.0–46.0)
Hemoglobin: 12.2 g/dL (ref 12.0–15.0)
MCH: 26.8 pg (ref 26.0–34.0)
MCHC: 32.6 g/dL (ref 30.0–36.0)
MCV: 82 fL (ref 80.0–100.0)
Platelets: 341 10*3/uL (ref 150–400)
RBC: 4.56 MIL/uL (ref 3.87–5.11)
RDW: 22 % — ABNORMAL HIGH (ref 11.5–15.5)
WBC: 10.4 10*3/uL (ref 4.0–10.5)
nRBC: 0 % (ref 0.0–0.2)

## 2022-08-03 LAB — BASIC METABOLIC PANEL
Anion gap: 11 (ref 5–15)
BUN: 39 mg/dL — ABNORMAL HIGH (ref 8–23)
CO2: 29 mmol/L (ref 22–32)
Calcium: 8.5 mg/dL — ABNORMAL LOW (ref 8.9–10.3)
Chloride: 92 mmol/L — ABNORMAL LOW (ref 98–111)
Creatinine, Ser: 1.19 mg/dL — ABNORMAL HIGH (ref 0.44–1.00)
GFR, Estimated: 43 mL/min — ABNORMAL LOW (ref >60)
Glucose, Bld: 195 mg/dL — ABNORMAL HIGH (ref 70–99)
Potassium: 4 mmol/L (ref 3.5–5.1)
Sodium: 132 mmol/L — ABNORMAL LOW (ref 135–145)

## 2022-08-03 LAB — GLUCOSE, CAPILLARY
Glucose-Capillary: 163 mg/dL — ABNORMAL HIGH (ref 70–99)
Glucose-Capillary: 142 mg/dL — ABNORMAL HIGH (ref 70–99)
Glucose-Capillary: 262 mg/dL — ABNORMAL HIGH (ref 70–99)
Glucose-Capillary: 206 mg/dL — ABNORMAL HIGH (ref 70–99)

## 2022-08-03 MED ORDER — ASPIRIN 81 MG PO TBEC
81.0000 mg | DELAYED_RELEASE_TABLET | Freq: Every day | ORAL | Status: DC
  Administered 2022-08-03 – 2022-08-04 (×2): 81 mg via ORAL
  Filled 2022-08-03 (×2): qty 1

## 2022-08-03 NOTE — TOC Progression Note (Addendum)
Transition of Care [facility]) - Progression Note    Patient Details  Name: [name] MRN: [id number] Date of Birth: 1929-03-25  Transition of Care [facility]) CM/SW [place], LCSW Phone Number: 08/03/2022, 1:02 PM  Clinical Narrative:  Therapy recommendation has switched back to SNF. Patient and son are agreeable. Son's first preference is [facility]. Asked admissions coordinator to review.   2:33 pm: [facility] is able to accept patient as soon as tomorrow if auth obtained. Patient and family are aware. Started auth.  4:18 pm: Auth approved. Auth number hasn't generated yet. Valid 11/8-11/10. Left message for [facility] admissions coordinator to notify. Asked RN to trial her without oxygen. Per PT, as long as staff can assist with get her in and out of car should be able to physically go by car.  Expected Discharge Plan: [place] Barriers to Discharge: Continued Medical Work up  Expected Discharge Plan and Services Expected Discharge Plan: [place] Choice: Resumption of Svcs/PTA Provider Living arrangements for the past 2 months: Single Family Home                           HH Arranged: RN, PT, OT [facility] Agency: [place] Date [place]: 08/02/22   Representative spoke with at [place]: Adela Lank   Social Determinants of Health (SDOH) Interventions    Readmission Risk Interventions    08/02/2022    1:07 PM  Readmission Risk Prevention Plan  Transportation Screening Complete  Medication Review ([place]) Complete  PCP or Specialist appointment within 3-5 days of discharge Complete  HRI or [place] Complete  SW Recovery Care/Counseling Consult Complete  [place] Not Applicable

## 2022-08-03 NOTE — Progress Notes (Addendum)
PROGRESS NOTE   HPI was taken from Dr. Dione Plover: [name] is a 88 y.o. female with medical history significant for combined systolic and diastolic CHF, CKD, hypertension, paroxysmal A-fib, type 2 diabetes, Lewy body dementia, COPD, CAD, who presents with shortness of breath.   Reports that earlier today she felt significantly more short of breath than usual.  She reports that her oxygen level went down to 89%.  She reports that she lives independently, and that she has not missed any of her medications recently.  No chest pain at present.  Ankles have been a bit swollen but no significant lower extremity edema.   In the ED initial vital signs notable for hypertension as well as mild hypoxia requiring 2 L nasal cannula.  BMP was notable only for hyperglycemia with glucose 245, CBC showed mild leukocytosis but was otherwise unremarkable, BNP was markedly elevated at 1821, and increased from 760 to 55-monthago.  Troponin was mildly elevated on initial check at 22, on recheck 2 hours later it was flat at 25.  EKG showed likely limb lead reversal, but otherwise no acute ischemic changes.  She was given a dose of 60 mg IV Lasix and admitted for further management.   As per Dr. WJimmye Norman11/4-08/03/22: Pt presented w/ shortness of breath and was found to have CHF exacerbation. Pt was treated w/ IV lasix and tolerated the rx fairly well. IV lasix frequency was reduced to daily instead of BID as pt's Cr was trending up. Cr is trending down today as compared to day prior. PT evaluated the pt and recs SNF. [facility]. For more information, please see previous progress notes.   FPHILOMINA LEON Hall:[id number]: [birth year]DOA: 07/30/2022 PCP: GJerrol Banana, MD  Assessment & Plan:   Principal Problem:   Acute on chronic combined systolic and diastolic CHF (congestive heart failure) (HCC) Active Problems:   Benign  essential HTN   Chronic kidney disease   H/O deep venous thrombosis   Paroxysmal atrial fibrillation (HCC)   Type 2 diabetes mellitus with vascular disease (HCC)   PVD (peripheral vascular disease) ([place]   Lewy body dementia ([place]   Celiac artery stenosis ([place]   Coronary artery disease involving native coronary artery of native heart   COPD (chronic obstructive pulmonary disease) (HCC)   Known medical problems   Elevated troponin   Pressure injury of skin  Assessment and Plan:  Acute on chronic combined CHF exacerbation: continue on IV lasix. Monitor I/Os. Repeat echo shows EF 45-50%, grade II diastolic dysfunction, LV demonstrates global hypokinesis, very similar to previous echo. Continue on plavix, statin, imdur, losartan, coreg & exetimibe   Hypokalemia: WNL today   DM2: fairly well controlled, HbA1c 6.9. Continue on glargine, aspart   Likely CKD: baseline Cr/GFR is unknown, currently stage IIIb. Cr is trending down from day prior   Generalized weakness: PT is now recommending SNF. [facility]   Depression: severity unknown. Continue on home dose of fluoxetine   Elevated troponins: likely secondary to demand ischemia  Lewy body dementia: continue on memantine, seroquel   GERD: continue on PPI    COPD: w/o exacerbation. Continue on bronchodilators  Pressure injury coccyx: present on admission, stage I. Continue w/ wound care   DVT prophylaxis: lovenox  Code Status:  full  Family Communication: discussed pt's care w/ pt's son, JJeneen Hall and answered  his questions  Disposition Plan: Will d/c to SNF/[facility] tomorrow   Level of care: Telemetry Cardiac  Status is: Inpatient Remains inpatient appropriate because: will d/c to SNF/[facility] tomorrow     Consultants:    Procedures:   Antimicrobials:    Subjective: Pt c/o fatigue. Pt likes to use supplemental oxygen for comfort only    Objective: Vitals:   08/03/22 0406  08/03/22 0500 08/03/22 0723 08/03/22 1125  BP: (!) 116/56  (!) 106/57 129/64  Pulse: 67  70 72  Resp: '16  16 16  '$ Temp: 97.8 F (36.6 C)  97.7 F (36.5 C) 97.7 F (36.5 C)  TempSrc:      SpO2: 99%  98% 100%  Weight:  58.6 kg    Height:        Intake/Output Summary (Last 24 hours) at 08/03/2022 1457 Last data filed at 08/03/2022 0500 Gross per 24 hour  Intake 3 ml  Output 150 ml  Net -147 ml   Filed Weights   08/01/22 0542 08/02/22 0500 08/03/22 0500  Weight: 56.6 kg 55.1 kg 58.6 kg    Examination:  General exam: Appears comfortable  Respiratory system: decreased breath sounds b/l Cardiovascular system: S1/S2+. No rubs or gallops  Gastrointestinal system: Abd is soft, NT, ND & normal bowel sounds  Central nervous system: Alert and awake. Moves all extremities   Psychiatry: judgement and insight appears at baseline. Flat mood and affect      Data Reviewed: I have personally reviewed following labs and imaging studies  CBC: Recent Labs  Lab 07/30/22 1606 07/31/22 0231 08/01/22 0816 08/02/22 0406 08/03/22 0339  WBC 12.3* 11.7* 10.8* 9.7 10.4  HGB 12.2 12.3 12.8 12.5 12.2  HCT 38.3 38.0 39.5 37.7 37.4  MCV 83.1 82.1 82.3 81.1 82.0  PLT 382 367 361 334 751   Basic Metabolic Panel: Recent Labs  Lab 07/30/22 1606 07/31/22 0231 08/01/22 0816 08/02/22 0406 08/03/22 0339  NA 136 135 137 133* 132*  K 3.5 3.0* 3.7 3.0* 4.0  CL 100 96* 96* 91* 92*  CO2 25 32 '29 29 29  '$ GLUCOSE 245* 189* 149* 140* 195*  BUN '17 16 19 '$ 28* 39*  CREATININE 0.89 0.79 1.06* 1.32* 1.19*  CALCIUM 8.8* 8.8* 8.8* 8.6* 8.5*   GFR: Estimated Creatinine Clearance: 24.4 mL/min (A) (by C-G formula based on SCr of 1.19 mg/dL (H)). Liver Function Tests: No results for input(s): "AST", "ALT", "ALKPHOS", "BILITOT", "PROT", "ALBUMIN" in the last 168 hours. No results for input(s): "LIPASE", "AMYLASE" in the last 168 hours. No results for input(s): "AMMONIA" in the last 168 hours. Coagulation  Profile: No results for input(s): "INR", "PROTIME" in the last 168 hours. Cardiac Enzymes: No results for input(s): "CKTOTAL", "CKMB", "CKMBINDEX", "TROPONINI" in the last 168 hours. BNP (last 3 results) No results for input(s): "PROBNP" in the last 8760 hours. HbA1C: No results for input(s): "HGBA1C" in the last 72 hours. CBG: Recent Labs  Lab 08/02/22 1255 08/02/22 1646 08/02/22 2025 08/03/22 0725 08/03/22 1127  GLUCAP 210* 284* 249* 163* 142*   Lipid Profile: No results for input(s): "CHOL", "HDL", "LDLCALC", "TRIG", "CHOLHDL", "LDLDIRECT" in the last 72 hours. Thyroid Function Tests: No results for input(s): "TSH", "T4TOTAL", "FREET4", "T3FREE", "THYROIDAB" in the last 72 hours. Anemia Panel: No results for input(s): "VITAMINB12", "FOLATE", "FERRITIN", "TIBC", "IRON", "RETICCTPCT" in the last 72 hours. Sepsis Labs: No results for input(s): "PROCALCITON", "LATICACIDVEN" in the last 168 hours.  No results found for this or any previous visit (from  the past 240 hour(s)).       Radiology Studies: ECHOCARDIOGRAM LIMITED  Result Date: 08/02/2022    ECHOCARDIOGRAM LIMITED REPORT   Patient Name:   ESPERANZA MADRAZO Date of Exam: 08/02/2022 Medical Rec #:  [id number]            Height:       62.0 in Accession #:    [id number]           Weight:       121.5 lb Date of Birth:  02-Apr-1929            BSA:          1.547 m Patient Age:    37 years             BP:           136/63 mmHg Patient Gender: F                    HR:           82 bpm. Exam Location:  [facility] Procedure: Limited Echo, Limited Color Doppler, Cardiac Doppler and 3D Echo Indications:     CHF-Acute Diastolic N27.78  History:         Patient has prior history of Echocardiogram examinations, most                  recent 06/16/2022. CHF, COPD, Arrythmias:Atrial Fibrillation;                  Risk Factors:Hypertension, Diabetes and Dyslipidemia.  Sonographer:     Mikki Santee RDCS Referring Phys:  [id number] [place] M ECKSTAT  Diagnosing Phys: Kathlyn Sacramento MD IMPRESSIONS  1. Left ventricular ejection fraction, by estimation, is 45 to 50%. The left ventricle has mildly decreased function. The left ventricle demonstrates global hypokinesis. There is mild left ventricular hypertrophy. Left ventricular diastolic parameters are consistent with Grade II diastolic dysfunction (pseudonormalization).  2. Right ventricular systolic function is normal. The right ventricular size is normal.  3. Left atrial size was mildly dilated.  4. The mitral valve is normal in structure. Moderate mitral valve regurgitation. No evidence of mitral stenosis.  5. The aortic valve is normal in structure. Aortic valve regurgitation is not visualized. Aortic valve sclerosis/calcification is present, without any evidence of aortic stenosis.  6. The inferior vena cava is normal in size with greater than 50% respiratory variability, suggesting right atrial pressure of 3 mmHg. FINDINGS  Left Ventricle: Left ventricular ejection fraction, by estimation, is 45 to 50%. The left ventricle has mildly decreased function. The left ventricle demonstrates global hypokinesis. The left ventricular internal cavity size was normal in size. There is  mild left ventricular hypertrophy. Left ventricular diastolic parameters are consistent with Grade II diastolic dysfunction (pseudonormalization). Right Ventricle: The right ventricular size is normal. No increase in right ventricular wall thickness. Right ventricular systolic function is normal. Left Atrium: Left atrial size was mildly dilated. Right Atrium: Right atrial size was normal in size. Pericardium: There is no evidence of pericardial effusion. Mitral Valve: The mitral valve is normal in structure. Moderate mitral valve regurgitation. No evidence of mitral valve stenosis. Tricuspid Valve: The tricuspid valve is normal in structure. Tricuspid valve regurgitation is not demonstrated. No evidence of tricuspid stenosis. Aortic Valve:  The aortic valve is normal in structure. Aortic valve regurgitation is not visualized. Aortic valve sclerosis/calcification is present, without any evidence of aortic stenosis. Pulmonic Valve: The pulmonic valve was normal in structure.  Pulmonic valve regurgitation is not visualized. No evidence of pulmonic stenosis. Aorta: The aortic root is normal in size and structure. Venous: The inferior vena cava is normal in size with greater than 50% respiratory variability, suggesting right atrial pressure of 3 mmHg. IAS/Shunts: No atrial level shunt detected by color flow Doppler. LEFT VENTRICLE PLAX 2D LVIDd:         4.50 cm     Diastology LVIDs:         3.50 cm     LV e' medial:    3.45 cm/s LV PW:         1.10 cm     LV E/e' medial:  25.5 LV IVS:        1.00 cm     LV e' lateral:   3.75 cm/s                            LV E/e' lateral: 23.5  LV Volumes (MOD) LV vol d, MOD A2C: 71.7 ml LV vol d, MOD A4C: 74.5 ml LV vol s, MOD A2C: 41.0 ml 3D Volume EF: LV vol s, MOD A4C: 42.3 ml 3D EF:        45 % LV SV MOD A2C:     30.7 ml LV EDV:       137 ml LV SV MOD A4C:     74.5 ml LV ESV:       75 ml LV SV MOD BP:      31.9 ml LV SV:        62 ml LEFT ATRIUM         Index LA diam:    2.80 cm 1.81 cm/m  AORTIC VALVE LVOT Vmax:   73.90 cm/s LVOT Vmean:  48.200 cm/s LVOT VTI:    0.142 m MITRAL VALVE MV Area (PHT): 3.60 cm    SHUNTS MV Decel Time: 211 msec    Systemic VTI: 0.14 m MV E velocity: 88.00 cm/s MV A velocity: 81.80 cm/s MV E/A ratio:  1.08 Kathlyn Sacramento MD Electronically signed by Kathlyn Sacramento MD Signature Date/Time: 08/02/2022/12:42:32 PM    Final         Scheduled Meds:  aspirin EC  81 mg Oral Daily   atorvastatin  10 mg Oral Daily   carvedilol  6.25 mg Oral BID WC   clopidogrel  75 mg Oral Daily   enoxaparin (LOVENOX) injection  30 mg Subcutaneous Q24H   ezetimibe  10 mg Oral Daily   FLUoxetine  10 mg Oral TID   furosemide  40 mg Intravenous Daily   insulin aspart  3 Units Subcutaneous TID WC   insulin  glargine-yfgn  6 Units Subcutaneous QHS   isosorbide mononitrate  30 mg Oral Daily   losartan  100 mg Oral Daily   memantine  10 mg Oral BID   pantoprazole  40 mg Oral Daily   QUEtiapine  50 mg Oral QHS   sodium chloride flush  3 mL Intravenous Q12H   Continuous Infusions:   LOS: 4 days    Time spent: 25 mins     Wyvonnia Dusky, MD [facility] Pager [phone]  If 7PM-7AM, please contact night-coverage [email or web address] 08/03/2022, 2:57 PM

## 2022-08-03 NOTE — Inpatient Diabetes Management (Signed)
Inpatient Diabetes Program Recommendations  AACE/ADA: New Consensus Statement on Inpatient Glycemic Control  Target Ranges:  Prepandial:   less than 140 mg/dL      Peak postprandial:   less than 180 mg/dL (1-2 hours)      Critically ill patients:  140 - 180 mg/dL    Latest Reference Range & Units 08/02/22 08:55 08/02/22 12:55 08/02/22 16:46 08/02/22 20:25 08/03/22 07:25  Glucose-Capillary 70 - 99 mg/dL 131 (H) 210 (H) 284 (H) 249 (H) 163 (H)   Review of Glycemic Control  Diabetes history: DM2 Outpatient Diabetes medications: Lantus 12 units QHS, Humalog 12 units TID with meals Current orders for Inpatient glycemic control: Semglee 6 units QHS, Novolog 3 units TID with meals  Inpatient Diabetes Program Recommendations:    Insulin: Please consider ordering Novolog 0-6 units TID with meals.  Thanks, Barnie Alderman, RN, MSN, [place] Diabetes Coordinator Inpatient Diabetes Program [phone] (Team Pager from 8am to [place])

## 2022-08-03 NOTE — Progress Notes (Signed)
Physical Therapy Treatment Patient Details Name: [name] MRN: [id number] DOB: 1929-04-22 Today's Date: 08/03/2022   History of Present Illness Pt is a 88 year old female admitted with Acute on chronic combined CHF exacerbation; PMH significant for combined systolic and diastolic CHF, CKD, hypertension, paroxysmal A-fib, type 2 diabetes, Lewy body dementia, COPD, CAD    PT Comments    Seem today per MD and family request over concerns over discharge home.  Pt in chair voicing general soreness from immobility.  She does agree to gait and is able to stand with min a x 1 from chair and commode and reports LE weakness while doing so.  Slow but generally steady gait limited to toilet and back.  Assist for self care and to pull up briefs.    Discussed discharge plan with pt.  She stated she does live alone and uses a walker at baseline.  She reports having no difficulty walking and good confidence at home.  She reports she is not at her baseline "I couldn't make it to the bathroom now by myself without falling."  While pt did do fait on one ambulation trial yesterday, she continues to have difficulty and weakness limiting her gait and ability to care for herself at home. Agree, pt would benefit from SNF stay to get back to baseline.     Recommendations for follow up therapy are one component of a multi-disciplinary discharge planning process, led by the attending physician.  Recommendations Tammy be updated based on patient status, additional functional criteria and insurance authorization.  Follow Up Recommendations  Skilled nursing-short term rehab (<3 hours/day)     Assistance Recommended at Discharge Frequent or constant Supervision/Assistance  Patient can return home with the following A little help with walking and/or transfers;Assistance with cooking/housework;Assist for transportation;A little help with bathing/dressing/bathroom;Help with stairs or ramp for entrance   Equipment  Recommendations  BSC/3in1;Rolling walker (2 wheels)    Recommendations for Other Services       Precautions / Restrictions Precautions Precautions: Fall Restrictions Weight Bearing Restrictions: No Other Position/Activity Restrictions: low vision, hearing     Mobility  Bed Mobility               General bed mobility comments: in recliner before and after    Transfers Overall transfer level: Needs assistance Equipment used: Rolling walker (2 wheels) Transfers: Sit to/from Stand Sit to Stand: Min assist           General transfer comment: increased assist from toilet    Ambulation/Gait Ambulation/Gait assistance: Min guard, Min assist Gait Distance (Feet): 40 Feet Assistive device: Rolling walker (2 wheels) Gait Pattern/deviations: Decreased step length - right, Step-through pattern, Decreased step length - left, Trunk flexed Gait velocity: decreased     General Gait Details: to and from bathroom.  pt limited by fatigue   Stairs             Wheelchair Mobility    Modified Rankin (Stroke Patients Only)       Balance Overall balance assessment: Needs assistance Sitting-balance support: No upper extremity supported, Feet supported Sitting balance-Leahy Scale: Good     Standing balance support: Bilateral upper extremity supported, During functional activity, Reliant on assistive device for balance Standing balance-Leahy Scale: Fair                              Cognition Arousal/Alertness: Awake/alert Behavior During Therapy: WFL for tasks assessed/performed Overall  Cognitive Status: Within Functional Limits for tasks assessed                                          Exercises      General Comments        Pertinent Vitals/Pain Pain Assessment Pain Assessment: Faces Faces Pain Scale: Hurts even more Pain Location: reports pain in calves, knees, back and chest she attributes to immobility Pain  Descriptors / Indicators: Sore Pain Intervention(s): Limited activity within patient's tolerance, Monitored during session, Repositioned    Home Living                          Prior Function            PT Goals (current goals can now be found in the care plan section) Progress towards PT goals: Progressing toward goals    Frequency    Min 2X/week      PT Plan Discharge plan needs to be updated    Co-evaluation              AM-PAC PT "6 Clicks" Mobility   Outcome Measure  Help needed turning from your back to your side while in a flat bed without using bedrails?: None Help needed moving from lying on your back to sitting on the side of a flat bed without using bedrails?: None Help needed moving to and from a bed to a chair (including a wheelchair)?: A Little Help needed standing up from a chair using your arms (e.g., wheelchair or bedside chair)?: A Little Help needed to walk in hospital room?: A Little Help needed climbing 3-5 steps with a railing? : A Lot 6 Click Score: 19    End of Session Equipment Utilized During Treatment: Gait belt;Oxygen (2 L O2 via nasal cannula) Activity Tolerance: Patient limited by fatigue Patient left: in chair;with call bell/phone within reach;with chair alarm set Nurse Communication: Mobility status PT Visit Diagnosis: Difficulty in walking, not elsewhere classified (R26.2);Muscle weakness (generalized) (M62.81);Unsteadiness on feet (R26.81)     Time: 8127-5170 PT Time Calculation (min) (ACUTE ONLY): 18 min  Charges:  $Gait Training: 8-22 mins                    Chesley Noon, PTA 08/03/22, 11:32 AM

## 2022-08-03 NOTE — NC FL2 (Signed)
Accident LEVEL OF CARE SCREENING TOOL     IDENTIFICATION  Patient Name: [name] Birthdate: 10/28/1928 Sex: female Admission Date (Current Location): 07/30/2022  [place] and [place] Number:  Engineering geologist and Address:  [facility], [address] [place], [place] [address]      Provider Number: [id number]  Attending Physician Name and Address:  Wyvonnia Dusky, MD  Relative Name and Phone Number:       Current Level of Care: Hospital Recommended Level of Care: [place] Prior Approval Number:    Date Approved/Denied:   PASRR Number: [id number]  Discharge Plan: SNF    Current Diagnoses: Patient Active Problem List   Diagnosis Date Noted   Pressure injury of skin 07/31/2022   Acute on chronic combined systolic and diastolic CHF (congestive heart failure) ([place]) 07/30/2022   Known medical problems 07/30/2022   Elevated troponin 07/30/2022   Iron deficiency anemia 06/21/2022   Community acquired pneumonia    CHF (congestive heart failure) ([place]) 06/15/2022   Pain of left hip joint 03/08/2022   History of falling 07/14/2021   COPD (chronic obstructive pulmonary disease) ([place]) 07/14/2021   Neurocognitive disorder with Lewy bodies (CODE) ([place]) 07/07/2021   Atherosclerosis of native arteries of the extremities with ulceration ([place]) 04/07/2021   PSVT (paroxysmal supraventricular tachycardia) 33/82/5053   Acute metabolic encephalopathy 97/67/3419   Frequent falls 02/10/2021   COVID-19 virus infection 02/10/2021   Chronic kidney disease, stage 3a ([place]) 02/10/2021   Dementia ([place]) 02/10/2021   Garbled speech, intermittent 02/10/2021   Laceration without foreign body, left ankle, subsequent encounter 02/10/2021   AMS (altered mental status) 02/10/2021   Osteoarthritis of left shoulder 10/27/2020   Type 2 diabetes mellitus with diabetic peripheral angiopathy without gangrene ([place]) 09/27/2020    Personal history of malignant melanoma of skin 09/27/2020   Long term (current) use of insulin ([place]) 09/27/2020   Coronary artery disease involving native coronary artery of native heart 04/17/2020   Celiac artery stenosis ([place]) 06/12/2019   Bilateral lower abdominal cramping 04/20/2019   UTI symptoms 04/20/2019   History of rectal bleeding 03/26/2019   DM type 2 with diabetic peripheral neuropathy ([place]) 12/14/2018   Arthropathy of lumbar facet joint 09/04/2018   Degeneration of lumbar intervertebral disc 09/04/2018   Spondylolisthesis, grade 1 09/04/2018   Lewy body dementia ([place]) 02/17/2018   PVD (peripheral vascular disease) ([place]) 08/16/2017   Pain in limb 07/27/2016   Hallucinations 07/07/2016   Chest pain 07/01/2016   Depression 06/23/2016   Hypokalemia 09/03/2015   Insomnia 07/23/2015   Headache 07/23/2015   Type 2 diabetes mellitus with vascular disease ([place]) 06/24/2015   Chronic vulvitis 06/02/2015   Allergic rhinitis 05/30/2015   Anemia 05/30/2015   Back ache 05/30/2015   Body mass index (BMI) of 29.0-29.9 in adult 05/30/2015   Edema 05/30/2015   Dilatation of esophagus 05/30/2015   Fall 05/30/2015   H/O deep venous thrombosis 05/30/2015   Hypercholesteremia 05/30/2015   Heart & renal disease, hypertensive, with heart failure ([place]) 05/30/2015   Hyponatremia 05/30/2015   Calculus of kidney 05/30/2015   Gonalgia 05/30/2015   Psoriasis 05/30/2015   Avitaminosis D 05/30/2015   Cough 04/01/2015   Combined fat and carbohydrate induced hyperlipemia 12/30/2014   Paroxysmal atrial fibrillation ([place]) 07/10/2014   Abnormal gait 07/08/2014   Anxiety state 07/08/2014   Chronic kidney disease 07/08/2014   Acid reflux 07/08/2014   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[place]) 07/08/2014   COPD  exacerbation ([place]) 07/08/2014   Type II diabetes mellitus with renal manifestations ([place]) 07/08/2014   Benign essential HTN 06/26/2014   Carotid artery narrowing 06/26/2014   Myalgia 06/26/2014    Basal cell carcinoma of face 05/01/2014   Disease of female genital organs 11/28/2012   Incomplete bladder emptying 09/05/2012   Excessive urination at night 08/15/2012   Basal cell carcinoma of ear 10/26/2011    Orientation RESPIRATION BLADDER Height & Weight     Self, Time, Situation, Place  O2 (Nasal Cannula 2 L) Incontinent, External catheter Weight: 129 lb 3 oz (58.6 kg) Height:  '5\' 2"'$  (157.5 cm)  BEHAVIORAL SYMPTOMS/MOOD NEUROLOGICAL BOWEL NUTRITION STATUS   (None)  (Lewy body dementia) Continent Diet (Heart healthy/carb modified)  AMBULATORY STATUS COMMUNICATION OF NEEDS Skin   Limited Assist Verbally Skin abrasions, Bruising, Other (Comment), PU Stage and Appropriate Care (Erythema/redness.) PU Stage 1 Dressing: No Dressing (Bilateral coccyx)                     Personal Care Assistance Level of Assistance  Bathing, Feeding, Dressing Bathing Assistance: Limited assistance Feeding assistance: Limited assistance Dressing Assistance: Limited assistance     Functional Limitations Info  Sight, Hearing, Speech Sight Info: Adequate Hearing Info: Adequate Speech Info: Adequate    SPECIAL CARE FACTORS FREQUENCY  PT (By licensed PT), OT (By licensed OT)     PT Frequency: 5 x week OT Frequency: 5 x week            Contractures Contractures Info: Not present    Additional Factors Info  Code Status, Allergies Code Status Info: DNR Allergies Info: Bacitracin-neomycin-polymyxin, Neomycin-bacitracin Zn-polymyx, Latex, Lidocaine, Aricept (Donepezil Hcl), Benzalkonium Chloride, Ibuprofen, Lidocaine Hcl, Valdecoxib, Albuterol, Tape, Triamcinolone           Current Medications (08/03/2022):  This is the current hospital active medication list Current Facility-Administered Medications  Medication Dose Route Frequency Provider Last Rate Last Admin   acetaminophen (TYLENOL) tablet 650 mg  650 mg Oral Q6H PRN Clarnce Flock, MD       Or   acetaminophen (TYLENOL)  suppository 650 mg  650 mg Rectal Q6H PRN Clarnce Flock, MD       albuterol (PROVENTIL) (2.5 MG/3ML) 0.083% nebulizer solution 2.5 mg  2.5 mg Nebulization Q4H PRN Clarnce Flock, MD       aspirin EC tablet 81 mg  81 mg Oral Daily Wyvonnia Dusky, MD   81 mg at 08/03/22 0951   atorvastatin (LIPITOR) tablet 10 mg  10 mg Oral Daily Clarnce Flock, MD   10 mg at 08/03/22 0952   carvedilol (COREG) tablet 6.25 mg  6.25 mg Oral BID WC Clarnce Flock, MD   6.25 mg at 08/03/22 4742   clopidogrel (PLAVIX) tablet 75 mg  75 mg Oral Daily Clarnce Flock, MD   75 mg at 08/03/22 0952   enoxaparin (LOVENOX) injection 30 mg  30 mg Subcutaneous Q24H Dallie Piles, RPH   30 mg at 08/03/22 5956   ezetimibe (ZETIA) tablet 10 mg  10 mg Oral Daily Clarnce Flock, MD   10 mg at 08/03/22 0952   FLUoxetine (PROZAC) capsule 10 mg  10 mg Oral TID Clarnce Flock, MD   10 mg at 08/03/22 0952   furosemide (LASIX) injection 40 mg  40 mg Intravenous Daily Wyvonnia Dusky, MD   40 mg at 08/03/22 0952   insulin aspart (novoLOG) injection 3 Units  3 Units  Subcutaneous TID WC Clarnce Flock, MD   3 Units at 08/03/22 0953   insulin glargine-yfgn [facility]) injection 6 Units  6 Units Subcutaneous QHS Clarnce Flock, MD   6 Units at 08/03/22 0019   isosorbide mononitrate (IMDUR) 24 hr tablet 30 mg  30 mg Oral Daily Clarnce Flock, MD   30 mg at 08/03/22 0953   losartan (COZAAR) tablet 100 mg  100 mg Oral Daily Clarnce Flock, MD   100 mg at 08/03/22 0953   memantine (NAMENDA) tablet 10 mg  10 mg Oral BID Clarnce Flock, MD   10 mg at 08/03/22 0953   nitroGLYCERIN (NITROSTAT) SL tablet 0.4 mg  0.4 mg Sublingual Q5 min PRN Merlyn Lot, MD       ondansetron [facility]) tablet 4 mg  4 mg Oral Q6H PRN Clarnce Flock, MD       Or   ondansetron [facility]) injection 4 mg  4 mg Intravenous Q6H PRN Clarnce Flock, MD       oxyCODONE (Oxy IR/ROXICODONE) immediate release tablet 5  mg  5 mg Oral Q4H PRN Clarnce Flock, MD       pantoprazole (PROTONIX) EC tablet 40 mg  40 mg Oral Daily Clarnce Flock, MD   40 mg at 08/03/22 0954   polyethylene glycol (MIRALAX / GLYCOLAX) packet 17 g  17 g Oral Daily PRN Clarnce Flock, MD       QUEtiapine (SEROQUEL) tablet 50 mg  50 mg Oral QHS Clarnce Flock, MD   50 mg at 08/03/22 0017   sodium chloride flush (NS) 0.9 % injection 3 mL  3 mL Intravenous Q12H Clarnce Flock, MD   3 mL at 08/03/22 0954   traZODone (DESYREL) tablet 25 mg  25 mg Oral QHS PRN Clarnce Flock, MD   25 mg at 08/03/22 0017     Discharge Medications: Please see discharge summary for a list of discharge medications.  Relevant Imaging Results:  Relevant Lab Results:   Additional Information SS#: [id number]  Candie Chroman, LCSW

## 2022-08-04 ENCOUNTER — Inpatient Hospital Stay: Payer: Medicare Other

## 2022-08-04 DIAGNOSIS — I739 Peripheral vascular disease, unspecified: Secondary | ICD-10-CM

## 2022-08-04 DIAGNOSIS — R7989 Other specified abnormal findings of blood chemistry: Secondary | ICD-10-CM

## 2022-08-04 DIAGNOSIS — L89151 Pressure ulcer of sacral region, stage 1: Secondary | ICD-10-CM

## 2022-08-04 DIAGNOSIS — J9601 Acute respiratory failure with hypoxia: Principal | ICD-10-CM

## 2022-08-04 DIAGNOSIS — F32A Depression, unspecified: Secondary | ICD-10-CM

## 2022-08-04 LAB — CBC
HCT: 34.8 % — ABNORMAL LOW (ref 36.0–46.0)
Hemoglobin: 11.3 g/dL — ABNORMAL LOW (ref 12.0–15.0)
MCH: 26.7 pg (ref 26.0–34.0)
MCHC: 32.5 g/dL (ref 30.0–36.0)
MCV: 82.1 fL (ref 80.0–100.0)
Platelets: 301 10*3/uL (ref 150–400)
RBC: 4.24 MIL/uL (ref 3.87–5.11)
RDW: 21.8 % — ABNORMAL HIGH (ref 11.5–15.5)
WBC: 8.9 10*3/uL (ref 4.0–10.5)
nRBC: 0 % (ref 0.0–0.2)

## 2022-08-04 LAB — BASIC METABOLIC PANEL
Anion gap: 9 (ref 5–15)
BUN: 45 mg/dL — ABNORMAL HIGH (ref 8–23)
CO2: 27 mmol/L (ref 22–32)
Calcium: 8.5 mg/dL — ABNORMAL LOW (ref 8.9–10.3)
Chloride: 93 mmol/L — ABNORMAL LOW (ref 98–111)
Creatinine, Ser: 1.33 mg/dL — ABNORMAL HIGH (ref 0.44–1.00)
GFR, Estimated: 38 mL/min — ABNORMAL LOW (ref >60)
Glucose, Bld: 150 mg/dL — ABNORMAL HIGH (ref 70–99)
Potassium: 3.7 mmol/L (ref 3.5–5.1)
Sodium: 129 mmol/L — ABNORMAL LOW (ref 135–145)

## 2022-08-04 LAB — GLUCOSE, CAPILLARY
Glucose-Capillary: 121 mg/dL — ABNORMAL HIGH (ref 70–99)
Glucose-Capillary: 157 mg/dL — ABNORMAL HIGH (ref 70–99)
Glucose-Capillary: 151 mg/dL — ABNORMAL HIGH (ref 70–99)

## 2022-08-04 MED ORDER — POLYETHYLENE GLYCOL 3350 17 G PO PACK: 17.0000 g | PACK | Freq: Every day | ORAL | 0 refills | Status: DC | PRN

## 2022-08-04 MED ORDER — FUROSEMIDE 40 MG PO TABS: 40.0000 mg | ORAL_TABLET | Freq: Every day | ORAL | 0 refills | Status: DC

## 2022-08-04 MED ORDER — OXYCODONE HCL 5 MG PO TABS: 5.0000 mg | ORAL_TABLET | Freq: Four times a day (QID) | ORAL | 0 refills | Status: DC | PRN

## 2022-08-04 MED ORDER — ACETAMINOPHEN 325 MG PO TABS: 650.0000 mg | ORAL_TABLET | Freq: Four times a day (QID) | ORAL | Status: DC | PRN

## 2022-08-04 MED ORDER — HUMALOG KWIKPEN 100 UNIT/ML ~~LOC~~ SOPN: 3.0000 [IU] | PEN_INJECTOR | Freq: Three times a day (TID) | SUBCUTANEOUS | 11 refills | Status: AC

## 2022-08-04 MED ORDER — ASPIRIN 81 MG PO TBEC: 81.0000 mg | DELAYED_RELEASE_TABLET | Freq: Every day | ORAL | 0 refills | Status: DC

## 2022-08-04 MED ORDER — FUROSEMIDE 40 MG PO TABS
40.0000 mg | ORAL_TABLET | Freq: Every day | ORAL | Status: DC
  Administered 2022-08-04: 40 mg via ORAL
  Filled 2022-08-04: qty 1

## 2022-08-04 MED ORDER — INSULIN GLARGINE 100 UNIT/ML ~~LOC~~ SOLN: 6.0000 [IU] | Freq: Every day | SUBCUTANEOUS | 11 refills | Status: AC

## 2022-08-04 NOTE — Assessment & Plan Note (Signed)
CKD stage IIIa.  Creatinine rose a little bit to 1.33 today.  Continue to monitor as outpatient.  Recommend checking BMP in 1 week.

## 2022-08-04 NOTE — Assessment & Plan Note (Signed)
Respiratory status stable. 

## 2022-08-04 NOTE — Assessment & Plan Note (Signed)
This has resolved.  Patient is off oxygen at this point.  She held her saturations even with ambulation.

## 2022-08-04 NOTE — Assessment & Plan Note (Signed)
Last hemoglobin A1c 6.9.  We decreased the dose of insulin before meals and at night.

## 2022-08-04 NOTE — Discharge Summary (Signed)
Physician Discharge Summary   Patient: [name] MRN: [id number] DOB: 1929-07-09  Admit date:     07/30/2022  Discharge date: 08/04/22  Discharge Physician: Loletha Grayer   PCP: Jerrol Banana., MD   Recommendations at discharge:   Follow-up team at rehab 1 day  Discharge Diagnoses: Principal Problem:   Acute on chronic combined systolic and diastolic CHF (congestive heart failure) (HCC) Active Problems:   Benign essential HTN   Chronic kidney disease   H/O deep venous thrombosis   Hyponatremia   Paroxysmal atrial fibrillation (HCC)   Type 2 diabetes mellitus with vascular disease (HCC)   PVD (peripheral vascular disease) (HCC)   Lewy body dementia ([place])   Depression   Celiac artery stenosis (HCC)   Coronary artery disease involving native coronary artery of native heart   COPD (chronic obstructive pulmonary disease) (HCC)   Elevated troponin   Pressure injury of skin   Acute respiratory failure with hypoxia (HCC)  Resolved Problems:   * No resolved hospital problems. *  Hospital Course: [name] is a 88 y.o. female with medical history significant for combined systolic and diastolic CHF, CKD, hypertension, paroxysmal A-fib, type 2 diabetes, Lewy body dementia, COPD, CAD, who presents with shortness of breath.   Reports that earlier today she felt significantly more short of breath than usual.  She reports that her oxygen level went down to 89%.  She reports that she lives independently, and that she has not missed any of her medications recently.  No chest pain at present.  Ankles have been a bit swollen but no significant lower extremity edema.   In the ED initial vital signs notable for hypertension as well as mild hypoxia requiring 2 L nasal cannula.  BMP was notable only for hyperglycemia with glucose 245, CBC showed mild leukocytosis but was otherwise unremarkable, BNP was markedly elevated at 1821, and increased from 760 to 21-monthago.   Troponin was mildly elevated on initial check at 22, on recheck 2 hours later it was flat at 25.  EKG showed likely limb lead reversal, but otherwise no acute ischemic changes.  She was given a dose of 60 mg IV Lasix and admitted for further management.     As per Dr. WJimmye Norman11/4-08/03/22: Pt presented w/ shortness of breath and was found to have CHF exacerbation. Pt was treated w/ IV lasix and tolerated the rx fairly well. IV lasix frequency was reduced to daily instead of BID as pt's Cr was trending up. Cr is trending down today as compared to day prior. PT evaluated the pt and recs SNF. [facility]. For more information, please see previous progress notes.   11/8.  Sonogram right lower extremity negative for DVT.  Pulse ox with ambulation is good.  No need for oxygen.  With creatinine up at 1.33 we will hold off on spironolactone at this point. Assessment and Plan: * Acute on chronic combined systolic and diastolic CHF (congestive heart failure) (HCC) Repeat echo shows an EF of 45 to 50%.  The patient was diuresed with IV Lasix.  Switch to Lasix 40 mg daily.  Continue losartan and Coreg.  Refer to CHF clinic.  With creatinine elevation of 1.33 we will hold off on starting spironolactone at this point.  Can consider starting as outpatient  Acute respiratory failure with hypoxia (HWelcome This has resolved.  Patient is off oxygen at  this point.  She held her saturations even with ambulation.  Pressure injury of skin Present on admission.  See full description below.  Stage I blanchable coccyx.  Elevated troponin Only mild elevation at 25 and 27.  This is secondary to CHF.  COPD (chronic obstructive pulmonary disease) (HCC) Respiratory status stable.  Depression Continue fluoxetine  Lewy body dementia ([place]) On Seroquel and Namenda  PVD (peripheral vascular disease) ([place]) Currently on aspirin and Plavix  Type 2 diabetes  mellitus with vascular disease (HCC) Last hemoglobin A1c 6.9.  We decreased the dose of insulin before meals and at night.  Hyponatremia Sodium 129 today.  Fluid restrict to 1800 mL.  Continue to monitor as outpatient.  Check a BMP in 1 week.  Chronic kidney disease CKD stage IIIa.  Creatinine rose a little bit to 1.33 today.  Continue to monitor as outpatient.  Recommend checking BMP in 1 week.         Consultants: None Procedures performed: None Disposition: Rehabilitation facility Diet recommendation:  Cardiac and Carb modified diet DISCHARGE MEDICATION: Allergies as of 08/04/2022       Reactions   Bacitracin-neomycin-polymyxin Rash   Neomycin-bacitracin Zn-polymyx Swelling, Rash   Latex Rash   Lidocaine Rash   Aricept [donepezil Hcl] Diarrhea   Benzalkonium Chloride Itching, Swelling   Ibuprofen    Other reaction(s): Dizziness Heart fluttering. Tachycardia. Tachycardia.   Lidocaine Hcl Itching   Per pt, "all caine meds cause severe itching"   Valdecoxib Nausea And Vomiting   Albuterol Rash   Tape Rash   blisters   Triamcinolone Rash        Medication List     STOP taking these medications    aspirin 325 MG tablet Replaced by: aspirin EC 81 MG tablet       TAKE these medications    acetaminophen 325 MG tablet Commonly known as: TYLENOL Take 2 tablets (650 mg total) by mouth every 6 (six) hours as needed for mild pain (or Fever >/= 101).   albuterol 108 (90 Base) MCG/ACT inhaler Commonly known as: VENTOLIN HFA INHALE 2 PUFFS INTO THE LUNGS EVERY 4 HOURS AS NEEDED FOR WHEEZING ORSHORTNESS OF BREATH   aspirin EC 81 MG tablet Take 1 tablet (81 mg total) by mouth daily. Swallow whole. Start taking on: August 05, 2022 Replaces: aspirin 325 MG tablet   atorvastatin 10 MG tablet Commonly known as: LIPITOR TAKE 1 TABLET BY MOUTH ONCE DAILY.   carvedilol 6.25 MG tablet Commonly known as: COREG Take 1 tablet (6.25 mg total) by mouth 2 (two) times  daily with a meal.   clopidogrel 75 MG tablet Commonly known as: PLAVIX TAKE 1 TABLET BY MOUTH DAILY   cyanocobalamin 1000 MCG tablet Take 1 tablet (1,000 mcg total) by mouth daily.   Dupixent 300 MG/2ML Sopn Generic drug: Dupilumab Inject 300 mg into the skin every 14 (fourteen) days. Starting at day 15 for maintenance.   ezetimibe 10 MG tablet Commonly known as: ZETIA TAKE 1 TABLET BY MOUTH DAILY   fexofenadine 180 MG tablet Commonly known as: ALLEGRA Take 180 mg by mouth daily.   FLUoxetine 10 MG capsule Commonly known as: PROZAC TAKE 1 CAPSULE BY MOUTH 3 TIMES DAILY.   furosemide 40 MG tablet Commonly known as: LASIX Take 1 tablet (40 mg total) by mouth daily. Start taking on: August 05, 2022   glucose blood test strip ACCU-CHEK AVIVA PLUS TEST STRP Use to check sugars 3 times daily.   HumaLOG KwikPen 100  UNIT/ML KwikPen Generic drug: insulin lispro Inject 3 Units into the skin 3 (three) times daily. What changed: how much to take   insulin glargine 100 UNIT/ML injection Commonly known as: Lantus Inject 0.06 mLs (6 Units total) into the skin at bedtime. What changed: how much to take   iron polysaccharides 150 MG capsule Commonly known as: NIFEREX Take 1 capsule (150 mg total) by mouth daily.   isosorbide mononitrate 30 MG 24 hr tablet Commonly known as: IMDUR Take 30 mg by mouth daily.   lansoprazole 30 MG capsule Commonly known as: PREVACID TAKE 1 CAPSULE BY MOUTH ONCE DAILY AT NOON What changed: See the new instructions.   losartan 100 MG tablet Commonly known as: COZAAR TAKE 1 TABLET BY MOUTH DAILY   memantine 10 MG tablet Commonly known as: NAMENDA Take 10 mg by mouth 2 (two) times daily.   oxyCODONE 5 MG immediate release tablet Commonly known as: Oxy IR/ROXICODONE Take 1 tablet (5 mg total) by mouth every 6 (six) hours as needed for severe pain.   polyethylene glycol 17 g packet Commonly known as: MIRALAX / GLYCOLAX Take 17 g by mouth  daily as needed for mild constipation.   QUEtiapine 50 MG tablet Commonly known as: SEROQUEL Take 50 mg by mouth at bedtime.   vitamin E 1000 UNIT capsule Take 1,000 Units by mouth daily.        Contact information for after-discharge care     [facility] Preferred SNF .   Service: Skilled Nursing Contact information: [address] [place] [phone]                    Discharge Exam: Filed Weights   08/02/22 0500 08/03/22 0500 08/04/22 0500  Weight: 55.1 kg 58.6 kg 57.5 kg   Physical Exam HENT:     Head: Normocephalic.     Mouth/Throat:     Pharynx: No oropharyngeal exudate.  Eyes:     General: Lids are normal.     Conjunctiva/sclera: Conjunctivae normal.  Cardiovascular:     Rate and Rhythm: Normal rate and regular rhythm.     Heart sounds: Normal heart sounds, S1 normal and S2 normal.  Pulmonary:     Breath sounds: Examination of the right-lower field reveals decreased breath sounds. Examination of the left-lower field reveals decreased breath sounds. Decreased breath sounds present. No wheezing, rhonchi or rales.  Abdominal:     Palpations: Abdomen is soft.     Tenderness: There is no abdominal tenderness.  Musculoskeletal:     Right lower leg: Swelling present.     Left lower leg: Swelling present.  Skin:    General: Skin is warm.     Findings: No rash.  Neurological:     Mental Status: She is alert.      Condition at discharge: stable  The results of significant diagnostics from this hospitalization (including imaging, microbiology, ancillary and laboratory) are listed below for reference.   Imaging Studies: US Venous Img Lower Unilateral Right (DVT)  Result Date: 08/04/2022 CLINICAL DATA:  Right lower extremity pain the past 3 days EXAM: RIGHT LOWER EXTREMITY VENOUS DOPPLER ULTRASOUND TECHNIQUE: Gray-scale sonography  with compression, as well as color and duplex ultrasound, were performed to evaluate the deep venous system(s) from the level of the common femoral vein through the popliteal and proximal calf veins. COMPARISON:  None Available. FINDINGS: VENOUS Normal  compressibility of the common femoral, superficial femoral, and popliteal veins, as well as the visualized calf veins. Visualized portions of profunda femoral vein and great saphenous vein unremarkable. No filling defects to suggest DVT on grayscale or color Doppler imaging. Doppler waveforms show normal direction of venous flow, normal respiratory plasticity and response to augmentation. Limited views of the contralateral common femoral vein are unremarkable. OTHER None. Limitations: none IMPRESSION: Negative. Electronically Signed   By: Jacqulynn Cadet M.D.   On: 08/04/2022 11:09   ECHOCARDIOGRAM LIMITED  Result Date: 08/02/2022    ECHOCARDIOGRAM LIMITED REPORT   Patient Name:   MEKALA WINGER Date of Exam: 08/02/2022 Medical Rec #:  [id number]            Height:       62.0 in Accession #:    [id number]           Weight:       121.5 lb Date of Birth:  [birth year]            BSA:          1.547 m Patient Age:    88 years             BP:           136/63 mmHg Patient Gender: F                    HR:           82 bpm. Exam Location:  [facility] Procedure: Limited Echo, Limited Color Doppler, Cardiac Doppler and 3D Echo Indications:     CHF-Acute Diastolic V03.50  History:         Patient has prior history of Echocardiogram examinations, most                  recent 06/16/2022. CHF, COPD, Arrythmias:Atrial Fibrillation;                  Risk Factors:Hypertension, Diabetes and Dyslipidemia.  Sonographer:     Mikki Santee RDCS Referring Phys:  [id number] [place] M ECKSTAT Diagnosing Phys: Kathlyn Sacramento MD IMPRESSIONS  1. Left ventricular ejection fraction, by estimation, is 45 to 50%. The left ventricle has mildly decreased function. The left ventricle demonstrates  global hypokinesis. There is mild left ventricular hypertrophy. Left ventricular diastolic parameters are consistent with Grade II diastolic dysfunction (pseudonormalization).  2. Right ventricular systolic function is normal. The right ventricular size is normal.  3. Left atrial size was mildly dilated.  4. The mitral valve is normal in structure. Moderate mitral valve regurgitation. No evidence of mitral stenosis.  5. The aortic valve is normal in structure. Aortic valve regurgitation is not visualized. Aortic valve sclerosis/calcification is present, without any evidence of aortic stenosis.  6. The inferior vena cava is normal in size with greater than 50% respiratory variability, suggesting right atrial pressure of 3 mmHg. FINDINGS  Left Ventricle: Left ventricular ejection fraction, by estimation, is 45 to 50%. The left ventricle has mildly decreased function. The left ventricle demonstrates global hypokinesis. The left ventricular internal cavity size was normal in size. There is  mild left ventricular hypertrophy. Left ventricular diastolic parameters are consistent with Grade II diastolic dysfunction (pseudonormalization). Right Ventricle: The right ventricular size is normal. No increase in right ventricular wall thickness. Right ventricular systolic function is normal. Left Atrium: Left atrial size was mildly dilated. Right Atrium: Right atrial size was normal in size. Pericardium: There is no evidence of pericardial effusion.  Mitral Valve: The mitral valve is normal in structure. Moderate mitral valve regurgitation. No evidence of mitral valve stenosis. Tricuspid Valve: The tricuspid valve is normal in structure. Tricuspid valve regurgitation is not demonstrated. No evidence of tricuspid stenosis. Aortic Valve: The aortic valve is normal in structure. Aortic valve regurgitation is not visualized. Aortic valve sclerosis/calcification is present, without any evidence of aortic stenosis. Pulmonic Valve: The  pulmonic valve was normal in structure. Pulmonic valve regurgitation is not visualized. No evidence of pulmonic stenosis. Aorta: The aortic root is normal in size and structure. Venous: The inferior vena cava is normal in size with greater than 50% respiratory variability, suggesting right atrial pressure of 3 mmHg. IAS/Shunts: No atrial level shunt detected by color flow Doppler. LEFT VENTRICLE PLAX 2D LVIDd:         4.50 cm     Diastology LVIDs:         3.50 cm     LV e' medial:    3.45 cm/s LV PW:         1.10 cm     LV E/e' medial:  25.5 LV IVS:        1.00 cm     LV e' lateral:   3.75 cm/s                            LV E/e' lateral: 23.5  LV Volumes (MOD) LV vol d, MOD A2C: 71.7 ml LV vol d, MOD A4C: 74.5 ml LV vol s, MOD A2C: 41.0 ml 3D Volume EF: LV vol s, MOD A4C: 42.3 ml 3D EF:        45 % LV SV MOD A2C:     30.7 ml LV EDV:       137 ml LV SV MOD A4C:     74.5 ml LV ESV:       75 ml LV SV MOD BP:      31.9 ml LV SV:        62 ml LEFT ATRIUM         Index LA diam:    2.80 cm 1.81 cm/m  AORTIC VALVE LVOT Vmax:   73.90 cm/s LVOT Vmean:  48.200 cm/s LVOT VTI:    0.142 m MITRAL VALVE MV Area (PHT): 3.60 cm    SHUNTS MV Decel Time: 211 msec    Systemic VTI: 0.14 m MV E velocity: 88.00 cm/s MV A velocity: 81.80 cm/s MV E/A ratio:  1.08 Kathlyn Sacramento MD Electronically signed by Kathlyn Sacramento MD Signature Date/Time: 08/02/2022/12:42:32 PM    Final    DG Chest 2 View  Result Date: 07/30/2022 CLINICAL DATA:  Shortness of breath since last night. EXAM: CHEST - 2 VIEW COMPARISON:  06/22/2022 FINDINGS: Stable enlarged cardiac silhouette. Calcified aortic arch. Stable mild prominence of the interstitial markings small amount of linear scarring or atelectasis in the left mid lung zone. Stable small bilateral pleural effusions. Mild-to-moderate peribronchial thickening. Midthoracic spine degenerative changes. Mild left glenohumeral joint degenerative changes. IMPRESSION: 1. No acute abnormality. 2. Stable  cardiomegaly, mild chronic interstitial lung disease and possible mild interstitial pulmonary edema, small bilateral pleural effusions and mild to moderate bronchitic changes. Electronically Signed   By: Claudie Revering M.D.   On: 07/30/2022 16:49    Microbiology: Results for orders placed or performed during the hospital encounter of 06/23/22  SARS Coronavirus 2 by RT PCR (hospital order, performed in [facility] hospital lab) *cepheid single  result test* Anterior Nasal Swab     Status: None   Collection Time: 06/22/22  9:59 PM   Specimen: Anterior Nasal Swab  Result Value Ref Range Status   SARS Coronavirus 2 by RT PCR NEGATIVE NEGATIVE Final    Comment: (NOTE) SARS-CoV-2 target nucleic acids are NOT DETECTED.  The SARS-CoV-2 RNA is generally detectable in upper and lower respiratory specimens during the acute phase of infection. The lowest concentration of SARS-CoV-2 viral copies this assay can detect is 250 copies / mL. A negative result does not preclude SARS-CoV-2 infection and should not be used as the sole basis for treatment or other patient management decisions.  A negative result may occur with improper specimen collection / handling, submission of specimen other than nasopharyngeal swab, presence of viral mutation(s) within the areas targeted by this assay, and inadequate number of viral copies (<250 copies / mL). A negative result must be combined with clinical observations, patient history, and epidemiological information.  Fact Sheet for Patients:   https://www.patel.info/  Fact Sheet for Healthcare Providers: https://hall.com/  This test is not yet approved or  cleared by the [place] FDA and has been authorized for detection and/or diagnosis of SARS-CoV-2 by FDA under an Emergency Use Authorization (EUA).  This EUA will remain in effect (meaning this test can be used) for the duration of the COVID-19 declaration under  Section 564(b)(1) of the Act, 21 U.S.C. section 360bbb-3(b)(1), unless the authorization is terminated or revoked sooner.  Performed at [facility], [address] [place], [place] [address]   Urine Culture     Status: Abnormal   Collection Time: 06/23/22  2:46 AM   Specimen: Urine, Clean Catch  Result Value Ref Range Status   Specimen Description   Final    URINE, CLEAN CATCH Performed at [facility], [address] [place], [place] [address]    Special Requests   Final    NONE Performed at [facility], [address] [place], [place] [address]    Culture MULTIPLE SPECIES PRESENT, SUGGEST RECOLLECTION (A)  Final   Report Status 06/24/2022 FINAL  Final    Labs: CBC: Recent Labs  Lab 07/31/22 0231 08/01/22 0816 08/02/22 0406 08/03/22 0339 08/04/22 0534  WBC 11.7* 10.8* 9.7 10.4 8.9  HGB 12.3 12.8 12.5 12.2 11.3*  HCT 38.0 39.5 37.7 37.4 34.8*  MCV 82.1 82.3 81.1 82.0 82.1  PLT 367 361 334 341 672   Basic Metabolic Panel: Recent Labs  Lab 07/31/22 0231 08/01/22 0816 08/02/22 0406 08/03/22 0339 08/04/22 0534  NA 135 137 133* 132* 129*  K 3.0* 3.7 3.0* 4.0 3.7  CL 96* 96* 91* 92* 93*  CO2 32 '29 29 29 27  '$ GLUCOSE 189* 149* 140* 195* 150*  BUN 16 19 28* 39* 45*  CREATININE 0.79 1.06* 1.32* 1.19* 1.33*  CALCIUM 8.8* 8.8* 8.6* 8.5* 8.5*   Liver Function Tests: No results for input(s): "AST", "ALT", "ALKPHOS", "BILITOT", "PROT", "ALBUMIN" in the last 168 hours. CBG: Recent Labs  Lab 08/03/22 1127 08/03/22 1625 08/03/22 2249 08/04/22 0739 08/04/22 1119  GLUCAP 142* 262* 206* 121* 157*    Discharge time spent: greater than 30 minutes.  Signed: Loletha Grayer, MD [facility] 08/04/2022

## 2022-08-04 NOTE — Assessment & Plan Note (Signed)
On Seroquel and Namenda

## 2022-08-04 NOTE — TOC Transition Note (Addendum)
Transition of Care [facility]) - CM/SW Discharge Note   Patient Details  Name: [name] MRN: [id number] Date of Birth: 1929/04/04  Transition of Care [facility]) CM/SW Contact:  Candie Chroman, LCSW Phone Number: 08/04/2022, 1:27 PM   Clinical Narrative:  Patient has orders to discharge to [facility] today. RN will call report to [phone] (Room 503). EMS transport has been arranged and she is 2nd on the list. Family is aware EMS may not be covered by insurance. No further concerns. CSW signing off.   3:38 pm: Son will transport patient. Per RN, Network engineer cancelled transport.  Final next level of care: [place] Barriers to Discharge: Barriers Resolved   Patient Goals and CMS Choice     Choice offered to / list presented to : Patient, Adult Children  Discharge Placement PASRR number recieved: 08/03/22            Patient [facility]: [facility] Patient to be transferred to facility by: EMS Name of family member notified: Don Giarrusso Patient and family notified of of transfer: 08/04/22  Discharge Plan and Services     Post Acute Care Choice: Resumption of Svcs/PTA Provider                    HH Arranged: RN, PT, OT [facility] Agency: [place] Date [facility] Agency Contacted: 08/02/22   Representative spoke with at [place]: [place] ([place]) Interventions     Readmission Risk Interventions    08/02/2022    1:07 PM  Readmission Risk Prevention Plan  Transportation Screening Complete  Medication Review ([place]) Complete  PCP or Specialist appointment within 3-5 days of discharge Complete  HRI or [place] Complete  SW Recovery Care/Counseling Consult Complete  [place] Not Applicable

## 2022-08-04 NOTE — Progress Notes (Signed)
SATURATION QUALIFICATIONS: (This note is used to comply with regulatory documentation for home oxygen)  Patient Saturations on Room Air at Rest = 94%  Patient Saturations on Room Air while Ambulating = 97%  Pt maintained O2 saturation at 97% while ambulating in hallway on room air

## 2022-08-04 NOTE — Assessment & Plan Note (Signed)
Currently on aspirin and Plavix

## 2022-08-04 NOTE — Assessment & Plan Note (Signed)
Sodium 129 today.  Fluid restrict to 1800 mL.  Continue to monitor as outpatient.  Check a BMP in 1 week.

## 2022-08-04 NOTE — Assessment & Plan Note (Signed)
Present on admission.  See full description below.  Stage I blanchable coccyx.

## 2022-08-04 NOTE — TOC Progression Note (Signed)
Transition of Care [facility]) - Progression Note    Patient Details  Name: [name] MRN: [id number] Date of Birth: 11-08-1928  Transition of Care [facility]) CM/SW [place], LCSW Phone Number: 08/04/2022, 8:50 AM  Clinical Narrative:  Authorization number obtained: [id number]. [place] admissions coordinator is aware. Per RN, patient is 94% on room air. Reviewed flowsheets. Patient has been on room air since at least 7:20 pm.   Expected Discharge Plan: [place] Barriers to Discharge: Continued Medical Work up  Expected Discharge Plan and Services Expected Discharge Plan: [place] Choice: Resumption of Svcs/PTA Provider Living arrangements for the past 2 months: Single Family Home                           HH Arranged: RN, PT, OT [facility] Agency: [place] Date [facility] Agency Contacted: 08/02/22   Representative spoke with at [place]: Adela Lank   Social Determinants of Health (SDOH) Interventions    Readmission Risk Interventions    08/02/2022    1:07 PM  Readmission Risk Prevention Plan  Transportation Screening Complete  Medication Review ([place]) Complete  PCP or Specialist appointment within 3-5 days of discharge Complete  HRI or [place] Complete  SW Recovery Care/Counseling Consult Complete  [place] Not Applicable

## 2022-08-04 NOTE — Assessment & Plan Note (Signed)
Continue fluoxetine

## 2022-08-11 ENCOUNTER — Ambulatory Visit: Payer: Medicare Other | Admitting: Family

## 2022-08-12 ENCOUNTER — Other Ambulatory Visit: Payer: Self-pay

## 2022-08-12 DIAGNOSIS — L209 Atopic dermatitis, unspecified: Secondary | ICD-10-CM

## 2022-08-12 MED ORDER — DUPIXENT 300 MG/2ML ~~LOC~~ SOAJ: 300.0000 mg | SUBCUTANEOUS | 0 refills | Status: DC

## 2022-08-12 NOTE — Progress Notes (Signed)
Refill request faxed from [facility]  

## 2022-08-16 ENCOUNTER — Ambulatory Visit: Payer: Medicare Other | Admitting: Family Medicine

## 2022-08-16 ENCOUNTER — Encounter: Payer: Self-pay | Admitting: Physician Assistant

## 2022-08-16 ENCOUNTER — Ambulatory Visit (INDEPENDENT_AMBULATORY_CARE_PROVIDER_SITE_OTHER): Payer: Medicare Other | Admitting: Physician Assistant

## 2022-08-16 VITALS — BP 138/72 | HR 82 | Temp 97.7°F | Resp 16 | Wt 126.0 lb

## 2022-08-16 DIAGNOSIS — Z09 Encounter for follow-up examination after completed treatment for conditions other than malignant neoplasm: Secondary | ICD-10-CM | POA: Diagnosis not present

## 2022-08-16 DIAGNOSIS — E871 Hypo-osmolality and hyponatremia: Secondary | ICD-10-CM

## 2022-08-16 DIAGNOSIS — I5042 Chronic combined systolic (congestive) and diastolic (congestive) heart failure: Secondary | ICD-10-CM | POA: Diagnosis not present

## 2022-08-16 NOTE — Progress Notes (Unsigned)
I,Sulibeya S Dimas,acting as a Education administrator for [employer], PA-C.,have documented all relevant documentation on the behalf of Tammy Speak, PA-C,as directed by  [employer], PA-C while in the presence of [employer], PA-C.     Established patient visit   Patient: [name]   DOB: Mar 26, 1929   88 y.o. Female  MRN: [id number] Visit Date: 08/16/2022  Today's healthcare provider: Mardene Speak, PA-C   Chief Complaint  Patient presents with   Hospitalization Follow-up   Subjective    HPI  Follow up Hospitalization  Patient was admitted to [facility] on 07/30/22 and discharged on 08/04/22. Patient was discharged to [facility], D/C today from rehab.  She was treated for acute CHF. Treatment for this included switch to Lasix 40 mg daily. Continue losartan and Coreg.  Refer to CHF clinic.  With creatinine elevation of 1.33 we will hold off on starting spironolactone at this point.  Can consider starting as outpatient  Hyponatremia, CKD stage llla Sodium 129 today.  Fluid restrict to 1800 mL.  Continue to monitor as outpatient.  Check a BMP in 1 week. Type 2 diabetes mellitus with vascular disease (HCC) Last hemoglobin A1c 6.9.  We decreased the dose of insulin before meals and at night. Telephone follow up was done on  She reports excellent compliance with treatment. She reports this condition is improved. Patient brought in all of her medications today that were being given at [facility]. The only medications missing in the bag were Vitamin B, Vitamin E and iron. Of which orders are in the patients discharge papers from [facility].  ------------------------------------------------------------------------------------------   Medications: Outpatient Medications Prior to Visit  Medication Sig   albuterol (VENTOLIN HFA) 108 (90 Base) MCG/ACT inhaler INHALE 2 PUFFS INTO THE LUNGS EVERY 4 HOURS AS NEEDED FOR WHEEZING ORSHORTNESS OF BREATH   atorvastatin (LIPITOR)  10 MG tablet TAKE 1 TABLET BY MOUTH ONCE DAILY.   carvedilol (COREG) 6.25 MG tablet Take 1 tablet (6.25 mg total) by mouth 2 (two) times daily with a meal.   clopidogrel (PLAVIX) 75 MG tablet TAKE 1 TABLET BY MOUTH DAILY   Dupilumab (DUPIXENT) 300 MG/2ML SOPN Inject 300 mg into the skin every 14 (fourteen) days. Starting at day 15 for maintenance.   ezetimibe (ZETIA) 10 MG tablet TAKE 1 TABLET BY MOUTH DAILY   FLUoxetine (PROZAC) 10 MG capsule TAKE 1 CAPSULE BY MOUTH 3 TIMES DAILY.   furosemide (LASIX) 40 MG tablet Take 1 tablet (40 mg total) by mouth daily.   glucose blood test strip ACCU-CHEK AVIVA PLUS TEST STRP Use to check sugars 3 times daily.   HUMALOG KWIKPEN 100 UNIT/ML KwikPen Inject 3 Units into the skin 3 (three) times daily.   insulin glargine (LANTUS) 100 UNIT/ML injection Inject 0.06 mLs (6 Units total) into the skin at bedtime.   isosorbide mononitrate (IMDUR) 30 MG 24 hr tablet Take 30 mg by mouth daily.   lansoprazole (PREVACID) 30 MG capsule TAKE 1 CAPSULE BY MOUTH ONCE DAILY AT NOON (Patient taking differently: Take 30 mg by mouth in the morning.)   losartan (COZAAR) 100 MG tablet TAKE 1 TABLET BY MOUTH DAILY   memantine (NAMENDA) 10 MG tablet Take 10 mg by mouth 2 (two) times daily.   oxyCODONE (OXY IR/ROXICODONE) 5 MG immediate release tablet Take 1 tablet (5 mg total) by mouth every 6 (six) hours as needed for severe pain.   QUEtiapine (SEROQUEL) 50 MG tablet Take 50 mg by mouth at bedtime.  acetaminophen (TYLENOL) 325 MG tablet Take 2 tablets (650 mg total) by mouth every 6 (six) hours as needed for mild pain (or Fever >/= 101). (Patient not taking: Reported on 08/16/2022)   aspirin EC 81 MG tablet Take 1 tablet (81 mg total) by mouth daily. Swallow whole. (Patient not taking: Reported on 08/16/2022)   cyanocobalamin 1000 MCG tablet Take 1 tablet (1,000 mcg total) by mouth daily. (Patient not taking: Reported on 08/16/2022)   fexofenadine (ALLEGRA) 180 MG tablet Take 180  mg by mouth daily. (Patient not taking: Reported on 08/16/2022)   iron polysaccharides (NIFEREX) 150 MG capsule Take 1 capsule (150 mg total) by mouth daily. (Patient not taking: Reported on 08/16/2022)   polyethylene glycol (MIRALAX / GLYCOLAX) 17 g packet Take 17 g by mouth daily as needed for mild constipation. (Patient not taking: Reported on 08/16/2022)   vitamin E 1000 UNIT capsule Take 1,000 Units by mouth daily.   No facility-administered medications prior to visit.    Review of Systems  Constitutional:  Negative for chills and fever.  HENT:  Positive for rhinorrhea.   Respiratory:  Negative for cough, shortness of breath and wheezing.   Cardiovascular:  Negative for chest pain and leg swelling.  Gastrointestinal:  Negative for abdominal pain, diarrhea, nausea and vomiting.    Last CBC Lab Results  Component Value Date   WBC 8.9 08/04/2022   HGB 11.3 (L) 08/04/2022   HCT 34.8 (L) 08/04/2022   MCV 82.1 08/04/2022   MCH 26.7 08/04/2022   RDW 21.8 (H) 08/04/2022   PLT 301 53/29/9242   Last metabolic panel Lab Results  Component Value Date   GLUCOSE 150 (H) 08/04/2022   NA 129 (L) 08/04/2022   K 3.7 08/04/2022   CL 93 (L) 08/04/2022   CO2 27 08/04/2022   BUN 45 (H) 08/04/2022   CREATININE 1.33 (H) 08/04/2022   GFRNONAA 38 (L) 08/04/2022   CALCIUM 8.5 (L) 08/04/2022   PROT 7.2 06/15/2022   ALBUMIN 3.9 06/15/2022   LABGLOB 2.1 05/08/2021   AGRATIO 2.0 05/08/2021   BILITOT 0.9 06/15/2022   ALKPHOS 95 06/15/2022   AST 20 06/15/2022   ALT 12 06/15/2022   ANIONGAP 9 08/04/2022   Last lipids Lab Results  Component Value Date   CHOL 118 05/08/2021   HDL 44 05/08/2021   LDLCALC 41 05/08/2021   TRIG 209 (H) 05/08/2021   CHOLHDL 2.7 05/08/2021   Last hemoglobin A1c Lab Results  Component Value Date   HGBA1C 6.9 (H) 06/15/2022   Last thyroid functions Lab Results  Component Value Date   TSH 1.790 05/08/2021       Objective    BP 138/72 (BP Location: Left  Arm, Patient Position: Sitting, Cuff Size: Normal)   Pulse 82   Temp 97.7 F (36.5 C) (Oral)   Resp 16   Wt 126 lb (57.2 kg)   SpO2 97%   BMI 23.05 kg/m  BP Readings from Last 3 Encounters:  08/16/22 138/72  08/04/22 (!) 97/59  07/06/22 (!) 176/68   Wt Readings from Last 3 Encounters:  08/16/22 126 lb (57.2 kg)  08/04/22 126 lb 12.2 oz (57.5 kg)  06/22/22 139 lb 8.8 oz (63.3 kg)      Physical Exam Vitals reviewed.  Constitutional:      General: She is not in acute distress.    Appearance: Normal appearance. She is well-developed. She is not diaphoretic.  HENT:     Head: Normocephalic and atraumatic.  Eyes:  General: No scleral icterus.    Extraocular Movements: Extraocular movements intact.     Conjunctiva/sclera: Conjunctivae normal.     Pupils: Pupils are equal, round, and reactive to light.  Neck:     Thyroid: No thyromegaly.  Cardiovascular:     Rate and Rhythm: Normal rate and regular rhythm.     Pulses: Normal pulses.     Heart sounds: Normal heart sounds. No murmur heard. Pulmonary:     Effort: Pulmonary effort is normal. No respiratory distress.     Breath sounds: Rales (mild, bibasilar) present. No wheezing or rhonchi.  Musculoskeletal:     Cervical back: Neck supple.     Right lower leg: No edema.     Left lower leg: No edema.  Lymphadenopathy:     Cervical: No cervical adenopathy.  Skin:    General: Skin is warm and dry.     Findings: No rash.  Neurological:     Mental Status: She is alert and oriented to person, place, and time. Mental status is at baseline.  Psychiatric:        Behavior: Behavior normal.        Thought Content: Thought content normal.        Judgment: Judgment normal.      No results found for any visits on 08/16/22.  Assessment & Plan     1. Hyponatremia *** - Basic metabolic panel  2. Hospital discharge follow-up After treatment at [facility] Stable. Vitals are WNL. Was discharged from hospital on  08/04/22  Chronic combined systolic and diastolic CHF CAD Chronic and stable Her saturation today was WNL No leg swelling. Mild bibasilar crackles on PE/unclear if it is her base line. No SOB, CP, palpitations Continue lasix 40 mg daily Continue losartan and coreg. Spironolactone was held off due to creatinine elevation of 1.33 at d/c from hospital Repeated BMP Advised to schedule a FU appt with her cardiology  HTN Chronic and stable Her BP today was 138/72 Continue carvedilol 6. '25mg'$  BID, losartan '100mg'$  daily, imdur '30mg'$  Advised to adhere to low-salt diet and daily exercise as tolerated Will fu in December with her PCP  CKD Chronic. Her creatinine level was 1.33. we will recheck her BMP Advised an adequate water intake.  Hyponatremia Sodium at d/c was 129. Pt was advised to fluid restriction to 1800 mL Needs BMP for monitoring Will fu in a mo  H/O DVT Paroxysmal atrial fibrillation  DMII Last A1C was 6.9. BS between 115-110 in rehab Advised to continue taking reduced dose of insulin: And to watch for her diet. Pt's son was advised to check ADA site for nutrition advises and recepies  PVD Chronic and stable Continue aspirin 81 mg an Plavix   [place] Poli body dementia Chronic and stable Continue Seroquel and Namenda Advised to contact us for refills  Depression Chronic and stable Continue fluoxetine  COPD Chronic and stable No SOB . No wheezing. Advised to continue her current inhalers:  Pressure injury of skin  Acute respiratory failure with hypoxia Resolved while in hospital, per chart review. He current oxygen sat was WNL   FU with her PCP in a mo    The patient was advised to call back or seek an in-person evaluation if the symptoms worsen or if the condition fails to improve as anticipated.  I discussed the assessment and treatment plan with the patient. The patient was provided an opportunity to ask questions and all were answered. The patient agreed  with the  plan and demonstrated an understanding of the instructions.  The entirety of the information documented in the History of Present Illness, Review of Systems and Physical Exam were personally obtained by me. Portions of this information were initially documented by the CMA and reviewed by me for thoroughness and accuracy.   [name], [employer], [place] [phone] (phone) [phone] (fax)

## 2022-08-17 DIAGNOSIS — Z09 Encounter for follow-up examination after completed treatment for conditions other than malignant neoplasm: Secondary | ICD-10-CM | POA: Insufficient documentation

## 2022-08-23 NOTE — Telephone Encounter (Unsigned)
Copied from [place] [phone] [id number]. Topic: Quick Communication - Home Health Verbal Orders >> Aug 23, 2022  1:05 PM Everette C wrote: Caller/Agency: Prudencio Burly / [facility]  Callback Number: [phone] Requesting OT/PT/Skilled Nursing/Social Work/Speech Therapy: PT Frequency: 2w4 1w4

## 2022-08-23 NOTE — Telephone Encounter (Unsigned)
Copied from [place] [id number]. Topic: Quick Communication - Home Health Verbal Orders >> Aug 23, 2022  1:05 PM Everette C wrote: Caller/Agency: Prudencio Burly / [facility]  Callback Number: [phone] Requesting OT/PT/Skilled Nursing/Social Work/Speech Therapy: PT Frequency: 2w4 1w4

## 2022-08-24 ENCOUNTER — Telehealth: Payer: Self-pay | Admitting: Family Medicine

## 2022-08-24 MED ORDER — ISOSORBIDE MONONITRATE ER 30 MG PO TB24: 30.0000 mg | ORAL_TABLET | Freq: Every day | ORAL | 0 refills | Status: AC

## 2022-08-24 MED ORDER — CLOPIDOGREL BISULFATE 75 MG PO TABS: 75.0000 mg | ORAL_TABLET | Freq: Every day | ORAL | 3 refills | Status: AC

## 2022-08-24 NOTE — Telephone Encounter (Signed)
[facility] faxed refill request for the following medications:  isosorbide mononitrate (IMDUR) 30 MG 24 hr tablet     Please advise

## 2022-08-24 NOTE — Addendum Note (Signed)
Addended by: Shawna Orleans on: 08/24/2022 03:44 PM   Modules accepted: Orders

## 2022-08-24 NOTE — Telephone Encounter (Signed)
[facility] pharmacy faxed refill request for the following medications:    clopidogrel (PLAVIX) 75 MG tablet   Please advise

## 2022-08-25 ENCOUNTER — Ambulatory Visit: Payer: Medicare Other | Admitting: Oncology

## 2022-08-25 ENCOUNTER — Other Ambulatory Visit: Payer: Medicare Other

## 2022-08-25 NOTE — Telephone Encounter (Signed)
   OK FOR VERBAL ORDERS   Eulis Foster, MD  [facility]         Copied from [place] [id number]. Topic: Quick Communication - Home Health Verbal Orders >> Aug 23, 2022  1:05 PM Everette C wrote: Caller/Agency: Prudencio Burly / [facility]  Callback Number: [phone] Requesting OT/PT/Skilled Nursing/Social Work/Speech Therapy: PT Frequency: 2w4 1w4

## 2022-08-25 NOTE — Telephone Encounter (Signed)
Verbal orders given  

## 2022-08-31 ENCOUNTER — Inpatient Hospital Stay (HOSPITAL_BASED_OUTPATIENT_CLINIC_OR_DEPARTMENT_OTHER): Payer: Medicare Other | Admitting: Oncology

## 2022-08-31 ENCOUNTER — Encounter: Payer: Self-pay | Admitting: Pharmacist

## 2022-08-31 ENCOUNTER — Inpatient Hospital Stay: Payer: Medicare Other | Attending: Oncology

## 2022-08-31 ENCOUNTER — Encounter: Payer: Self-pay | Admitting: Oncology

## 2022-08-31 ENCOUNTER — Ambulatory Visit: Payer: Medicare Other | Attending: Family | Admitting: Family

## 2022-08-31 ENCOUNTER — Encounter: Payer: Self-pay | Admitting: Family

## 2022-08-31 VITALS — BP 165/83 | HR 81 | Temp 97.0°F | Resp 14 | Wt 131.5 lb

## 2022-08-31 VITALS — BP 153/75 | HR 86 | Resp 16 | Wt 132.0 lb

## 2022-08-31 DIAGNOSIS — Z800 Family history of malignant neoplasm of digestive organs: Secondary | ICD-10-CM | POA: Insufficient documentation

## 2022-08-31 DIAGNOSIS — N1831 Chronic kidney disease, stage 3a: Secondary | ICD-10-CM

## 2022-08-31 DIAGNOSIS — Z79899 Other long term (current) drug therapy: Secondary | ICD-10-CM | POA: Diagnosis not present

## 2022-08-31 DIAGNOSIS — I480 Paroxysmal atrial fibrillation: Secondary | ICD-10-CM | POA: Diagnosis not present

## 2022-08-31 DIAGNOSIS — E538 Deficiency of other specified B group vitamins: Secondary | ICD-10-CM | POA: Insufficient documentation

## 2022-08-31 DIAGNOSIS — D509 Iron deficiency anemia, unspecified: Secondary | ICD-10-CM | POA: Diagnosis present

## 2022-08-31 DIAGNOSIS — I130 Hypertensive heart and chronic kidney disease with heart failure and stage 1 through stage 4 chronic kidney disease, or unspecified chronic kidney disease: Secondary | ICD-10-CM | POA: Insufficient documentation

## 2022-08-31 DIAGNOSIS — J449 Chronic obstructive pulmonary disease, unspecified: Secondary | ICD-10-CM | POA: Diagnosis not present

## 2022-08-31 DIAGNOSIS — D649 Anemia, unspecified: Secondary | ICD-10-CM | POA: Diagnosis not present

## 2022-08-31 DIAGNOSIS — Z8041 Family history of malignant neoplasm of ovary: Secondary | ICD-10-CM | POA: Diagnosis not present

## 2022-08-31 DIAGNOSIS — E1122 Type 2 diabetes mellitus with diabetic chronic kidney disease: Secondary | ICD-10-CM | POA: Diagnosis not present

## 2022-08-31 DIAGNOSIS — I5043 Acute on chronic combined systolic (congestive) and diastolic (congestive) heart failure: Secondary | ICD-10-CM | POA: Diagnosis present

## 2022-08-31 DIAGNOSIS — N189 Chronic kidney disease, unspecified: Secondary | ICD-10-CM | POA: Insufficient documentation

## 2022-08-31 DIAGNOSIS — I340 Nonrheumatic mitral (valve) insufficiency: Secondary | ICD-10-CM | POA: Diagnosis not present

## 2022-08-31 DIAGNOSIS — I5022 Chronic systolic (congestive) heart failure: Secondary | ICD-10-CM | POA: Diagnosis not present

## 2022-08-31 DIAGNOSIS — Z794 Long term (current) use of insulin: Secondary | ICD-10-CM

## 2022-08-31 DIAGNOSIS — I10 Essential (primary) hypertension: Secondary | ICD-10-CM | POA: Diagnosis not present

## 2022-08-31 LAB — CBC WITH DIFFERENTIAL/PLATELET
Abs Immature Granulocytes: 0.04 10*3/uL (ref 0.00–0.07)
Basophils Absolute: 0.1 10*3/uL (ref 0.0–0.1)
Basophils Relative: 1 %
Eosinophils Absolute: 0.5 10*3/uL (ref 0.0–0.5)
Eosinophils Relative: 5 %
HCT: 41.5 % (ref 36.0–46.0)
Hemoglobin: 13.2 g/dL (ref 12.0–15.0)
Immature Granulocytes: 0 %
Lymphocytes Relative: 24 %
Lymphs Abs: 2.4 10*3/uL (ref 0.7–4.0)
MCH: 27.3 pg (ref 26.0–34.0)
MCHC: 31.8 g/dL (ref 30.0–36.0)
MCV: 85.9 fL (ref 80.0–100.0)
Monocytes Absolute: 1 10*3/uL (ref 0.1–1.0)
Monocytes Relative: 9 %
Neutro Abs: 6.1 10*3/uL (ref 1.7–7.7)
Neutrophils Relative %: 61 %
Platelets: 229 10*3/uL (ref 150–400)
RBC: 4.83 MIL/uL (ref 3.87–5.11)
RDW: 19.5 % — ABNORMAL HIGH (ref 11.5–15.5)
WBC: 10.1 10*3/uL (ref 4.0–10.5)
nRBC: 0 % (ref 0.0–0.2)

## 2022-08-31 LAB — IRON AND TIBC
Iron: 61 ug/dL (ref 28–170)
Saturation Ratios: 24 % (ref 10.4–31.8)
TIBC: 251 ug/dL (ref 250–450)
UIBC: 190 ug/dL

## 2022-08-31 LAB — VITAMIN B12: Vitamin B-12: 543 pg/mL (ref 180–914)

## 2022-08-31 LAB — FERRITIN: Ferritin: 275 ng/mL (ref 11–307)

## 2022-08-31 MED ORDER — POLYSACCHARIDE IRON COMPLEX 150 MG PO CAPS: 150.0000 mg | ORAL_CAPSULE | Freq: Every day | ORAL | 3 refills | Status: DC

## 2022-08-31 MED ORDER — CARVEDILOL 6.25 MG PO TABS: 6.2500 mg | ORAL_TABLET | Freq: Two times a day (BID) | ORAL | 3 refills | Status: DC

## 2022-08-31 NOTE — Progress Notes (Signed)
Patient ID: [name], female   DOB: [birth year], 88 y.o.   MRN: [id number]  [facility] COUNSELING NOTE  Guideline-Directed Medical Therapy/Evidence Based Medicine  ACE/ARB/ARNI: Losartan 100 mg daily Beta Blocker: Carvedilol 6.25 mg twice daily (No taking - out of refills) Aldosterone Antagonist:  none Diuretic: Furosemide 40 mg daily SGLT2i:  none  Adherence Assessment  Do you ever forget to take your medication? '[]'$ Yes '[x]'$ No  Do you ever skip doses due to side effects? '[]'$ Yes '[x]'$ No  Do you have trouble affording your medicines? '[]'$ Yes '[x]'$ No  Are you ever unable to pick up your medication due to transportation difficulties? '[]'$ Yes '[x]'$ No  Do you ever stop taking your medications because you don't believe they are helping? '[]'$ Yes '[x]'$ No  Do you check your weight daily? '[x]'$ Yes '[]'$ No   Adherence strategy: pill box packaged at pharmacy  Barriers to obtaining medications: none  Vital signs: HR 86, BP 153/75, weight (pounds) 132 lbs  ECHO: Date 06/15/22,  EF of 45-50% along with mild LVH/ LAE and mild MR      Latest Ref Rng & Units 08/04/2022    5:34 AM 08/03/2022    3:39 AM 08/02/2022    4:06 AM  BMP  Glucose 70 - 99 mg/dL 150  195  140   BUN 8 - 23 mg/dL 45  39  28   Creatinine 0.44 - 1.00 mg/dL 1.33  1.19  1.32   Sodium 135 - 145 mmol/L 129  132  133   Potassium 3.5 - 5.1 mmol/L 3.7  4.0  3.0   Chloride 98 - 111 mmol/L 93  92  91   CO2 22 - 32 mmol/L '27  29  29   '$ Calcium 8.9 - 10.3 mg/dL 8.5  8.5  8.6     Past Medical History:  Diagnosis Date   Anemia    Angina pectoris (HCC)    Atrial fibrillation (HCC)    Basal cell carcinoma    nose   Cervicalgia    CHF (congestive heart failure) (HCC)    Chronic back pain    Chronic kidney disease    Chronic obstructive pulmonary disease (COPD) (HCC)    COPD (chronic obstructive pulmonary disease) (HCC)    DDD (degenerative disc disease), lumbar    Diabetes  mellitus without complication (HCC)    Dysphagia    Hyperlipemia    Hypertension    Squamous cell carcinoma of skin    left lateral pretibial   Vulvovaginitis     ASSESSMENT 88 year old female who presents to the HF clinic for follow up. PMH includes DM, hyperlipidemia, HTN, CKD, anemia, PAF, COPD, dysphagia and chronic heart failure.  Noted last Ed admission was Nov/11/2021 for acute respiratory failure.  Medication reconciliation completed. Called [facility] pharmacy to confirm last pill box fill and med dosages. Noted carvedilol and iron not filled since October.  PLAN Resume carvedilol 6.'25mg'$  BID and iron polysaccharide daily Consider changing losartan to Entresto 49/'51mg'$  during f/u visit if vitals remain stable   Time spent: 20 minutes  Levina Boyack Rodriguez-Guzman PharmD, BCPS 08/31/2022 10:32 AM   Current Outpatient Medications:    albuterol (VENTOLIN HFA) 108 (90 Base) MCG/ACT inhaler, INHALE 2 PUFFS INTO THE LUNGS EVERY 4 HOURS AS NEEDED FOR WHEEZING ORSHORTNESS OF BREATH, Disp: 8.5 g, Rfl: 10   aspirin EC 81 MG tablet, Take 1 tablet (81 mg total) by mouth daily. Swallow whole., Disp: 30 tablet, Rfl: 0  atorvastatin (LIPITOR) 10 MG tablet, TAKE 1 TABLET BY MOUTH ONCE DAILY., Disp: 90 tablet, Rfl: 1   clopidogrel (PLAVIX) 75 MG tablet, Take 1 tablet (75 mg total) by mouth daily., Disp: 30 tablet, Rfl: 3   Dupilumab (DUPIXENT) 300 MG/2ML SOPN, Inject 300 mg into the skin every 14 (fourteen) days. Starting at day 15 for maintenance., Disp: 4 mL, Rfl: 0   ezetimibe (ZETIA) 10 MG tablet, TAKE 1 TABLET BY MOUTH DAILY, Disp: 90 tablet, Rfl: 1   fexofenadine (ALLEGRA) 180 MG tablet, Take 180 mg by mouth daily., Disp: , Rfl:    FLUoxetine (PROZAC) 10 MG capsule, TAKE 1 CAPSULE BY MOUTH 3 TIMES DAILY., Disp: 90 capsule, Rfl: 3   furosemide (LASIX) 40 MG tablet, Take 1 tablet (40 mg total) by mouth daily., Disp: 30 tablet, Rfl: 0   glucose blood test strip, ACCU-CHEK AVIVA PLUS TEST STRP  Use to check sugars 3 times daily., Disp: , Rfl:    HUMALOG KWIKPEN 100 UNIT/ML KwikPen, Inject 3 Units into the skin 3 (three) times daily., Disp: 15 mL, Rfl: 11   insulin glargine (LANTUS) 100 UNIT/ML injection, Inject 0.06 mLs (6 Units total) into the skin at bedtime., Disp: 10 mL, Rfl: 11   isosorbide mononitrate (IMDUR) 30 MG 24 hr tablet, Take 1 tablet (30 mg total) by mouth daily. Take 30 mg by mouth daily., Disp: 30 tablet, Rfl: 0   lansoprazole (PREVACID) 30 MG capsule, TAKE 1 CAPSULE BY MOUTH ONCE DAILY AT NOON (Patient taking differently: Take 30 mg by mouth in the morning.), Disp: 90 capsule, Rfl: 1   losartan (COZAAR) 100 MG tablet, TAKE 1 TABLET BY MOUTH DAILY, Disp: 90 tablet, Rfl: 1   memantine (NAMENDA) 10 MG tablet, Take 10 mg by mouth 2 (two) times daily., Disp: , Rfl:    oxyCODONE (OXY IR/ROXICODONE) 5 MG immediate release tablet, Take 1 tablet (5 mg total) by mouth every 6 (six) hours as needed for severe pain., Disp: 6 tablet, Rfl: 0   polyethylene glycol (MIRALAX / GLYCOLAX) 17 g packet, Take 17 g by mouth daily as needed for mild constipation., Disp: 30 each, Rfl: 0   QUEtiapine (SEROQUEL) 50 MG tablet, Take 50 mg by mouth at bedtime., Disp: , Rfl:    vitamin E 1000 UNIT capsule, Take 1,000 Units by mouth daily., Disp: , Rfl:    acetaminophen (TYLENOL) 325 MG tablet, Take 2 tablets (650 mg total) by mouth every 6 (six) hours as needed for mild pain (or Fever >/= 101). (Patient not taking: Reported on 08/31/2022), Disp: , Rfl:    carvedilol (COREG) 6.25 MG tablet, Take 1 tablet (6.25 mg total) by mouth 2 (two) times daily with a meal. (Patient not taking: Reported on 08/31/2022), Disp: 60 tablet, Rfl: 3   cyanocobalamin 1000 MCG tablet, Take 1 tablet (1,000 mcg total) by mouth daily., Disp: 30 tablet, Rfl: 2   iron polysaccharides (NIFEREX) 150 MG capsule, Take 1 capsule (150 mg total) by mouth daily. (Patient not taking: Reported on 08/31/2022), Disp: 30 capsule, Rfl:  3   MEDICATION ADHERENCES TIPS AND STRATEGIES Taking medication as prescribed improves patient outcomes in heart failure (reduces hospitalizations, improves symptoms, increases survival) Side effects of medications can be managed by decreasing doses, switching agents, stopping drugs, or adding additional therapy. Please let someone in the [facility] know if you have having bothersome side effects so we can modify your regimen. Do not alter your medication regimen without talking to [place].  Medication reminders  can help patients remember to take drugs on time. If you are missing or forgetting doses you can try linking behaviors, using pill boxes, or an electronic reminder like an alarm on your phone or an app. Some people can also get automated phone calls as medication reminders.

## 2022-08-31 NOTE — Patient Instructions (Signed)
Continue weighing daily and call for an overnight weight gain of 3 pounds or more or a weekly weight gain of more than 5 pounds.   If you have voicemail, please make sure your mailbox is cleaned out so that we may leave a message and please make sure to listen to any voicemails.     

## 2022-08-31 NOTE — Progress Notes (Signed)
Patient ID: [name], female    DOB: 15-Oct-1928, 88 y.o.   MRN: [id number]  HPI  [name] is a 88 y/o female with a history of DM, hyperlipidemia, HTN, CKD, anemia, PAF, COPD, dysphagia and chronic heart failure.   Echo report from 08/02/22 showed an EF of 45-50% along with mild LVH/ LAE and moderate MR. Echo report from 06/15/22 reviewed and showed an EF of 45-50% along with mild LVH/ LAE and mild MR.   Admitted 07/30/22 due to acute on chronic heart failure. BNP elevated. Initially given IV lasix with transition to oral diuretics. Negative for DVT. Spironolactone held due to rising creatinine. PT/OT evaluation done. Discharged after 5 days. Was in the ED 06/23/22 due to COPD exacerbation where she was treated and released. Admitted 06/15/22 due to cough and shortness of breath. Lasix increased but then held due to worsening renal function. Cardizem stopped. Discharged after 3 days.   She presents today for a follow-up visit with a chief complaint of minimal fatigue upon moderate exertion. Describes this as chronic in nature although much improved from her recent admission. She has associated fatigue along with this. She denies any difficulty sleeping, dizziness, abdominal distention, palpitations, pedal edema, chest pain, cough or weight gain.   Weighing daily with a talking scale and she says that this is working out well for her.   Getting her meds pre-packaged from [facility] on a monthly basis. When Pharm D called [facility] to review meds, patient's iron and carvedilol were not in her current pill pack because they were unable to get refills authorized. Meds were last packed on 08/25/22  Past Medical History:  Diagnosis Date   Anemia    Angina pectoris (HCC)    Atrial fibrillation (HCC)    Basal cell carcinoma    nose   Cervicalgia    CHF (congestive heart failure) (HCC)    Chronic back pain    Chronic kidney disease    Chronic obstructive pulmonary disease  (COPD) (HCC)    COPD (chronic obstructive pulmonary disease) (HCC)    DDD (degenerative disc disease), lumbar    Diabetes mellitus without complication ([place])    Dysphagia    Hyperlipemia    Hypertension    Squamous cell carcinoma of skin    left lateral pretibial   Vulvovaginitis    Past Surgical History:  Procedure Laterality Date   gall bladder     melanoma     removal neck and back   PERIPHERAL VASCULAR CATHETERIZATION Left 01/05/2016   Procedure: Lower Extremity Angiography;  Surgeon: Algernon Huxley, MD;  Location: [place] [facility];  Service: Cardiovascular;  Laterality: Left;   PERIPHERAL VASCULAR CATHETERIZATION  01/05/2016   Procedure: Lower Extremity Intervention;  Surgeon: Algernon Huxley, MD;  Location: [place] [facility];  Service: Cardiovascular;;   ureterolithiasis     calculus removed   VAGINAL HYSTERECTOMY     VISCERAL ANGIOGRAPHY N/A 05/10/2019   Procedure: VISCERAL ANGIOGRAPHY;  Surgeon: Algernon Huxley, MD;  Location: [place] [facility];  Service: Cardiovascular;  Laterality: N/A;   VULVA / PERINEUM BIOPSY  05/29/2015   Family History  Problem Relation Age of Onset   Diabetes Sister    Colon cancer Brother    Diabetes Brother    Atrial fibrillation Daughter    Ovarian cancer Other    Breast cancer Neg Hx    Social History   Tobacco Use   Smoking status: Never  Smokeless tobacco: Never  Substance Use Topics   Alcohol use: No   Allergies  Allergen Reactions   Bacitracin-Neomycin-Polymyxin Rash   Neomycin-Bacitracin Zn-Polymyx Swelling and Rash   Latex Rash   Lidocaine Rash   Aricept [Donepezil Hcl] Diarrhea   Benzalkonium Chloride Itching and Swelling   Ibuprofen     Other reaction(s): Dizziness Heart fluttering. Tachycardia. Tachycardia.   Lidocaine Hcl Itching    Per pt, "all caine meds cause severe itching"   Valdecoxib Nausea And Vomiting   Albuterol Rash   Tape Rash    blisters   Triamcinolone Rash   Prior to Admission medications    Medication Sig Start Date End Date Taking? Authorizing Provider  acetaminophen (TYLENOL) 325 MG tablet Take 2 tablets (650 mg total) by mouth every 6 (six) hours as needed for mild pain (or Fever >/= 101). Patient not taking: Reported on 08/16/2022 08/04/22   Loletha Grayer, MD  albuterol (VENTOLIN HFA) 108 (90 Base) MCG/ACT inhaler INHALE 2 PUFFS INTO THE LUNGS EVERY 4 HOURS AS NEEDED FOR WHEEZING ORSHORTNESS OF BREATH 04/12/22   Jerrol Banana., MD  aspirin EC 81 MG tablet Take 1 tablet (81 mg total) by mouth daily. Swallow whole. Patient not taking: Reported on 08/16/2022 08/05/22   Loletha Grayer, MD  atorvastatin (LIPITOR) 10 MG tablet TAKE 1 TABLET BY MOUTH ONCE DAILY. 04/06/22   Jerrol Banana., MD  carvedilol (COREG) 6.25 MG tablet Take 1 tablet (6.25 mg total) by mouth 2 (two) times daily with a meal. 06/18/22   Fritzi Mandes, MD  clopidogrel (PLAVIX) 75 MG tablet Take 1 tablet (75 mg total) by mouth daily. 08/24/22   Myles Gip, DO  cyanocobalamin 1000 MCG tablet Take 1 tablet (1,000 mcg total) by mouth daily. Patient not taking: Reported on 08/16/2022 06/18/22   Fritzi Mandes, MD  Dupilumab (DUPIXENT) 300 MG/2ML SOPN Inject 300 mg into the skin every 14 (fourteen) days. Starting at day 15 for maintenance. 08/12/22   Moye, [place], MD  ezetimibe (ZETIA) 10 MG tablet TAKE 1 TABLET BY MOUTH DAILY 04/30/22   Jerrol Banana., MD  fexofenadine (ALLEGRA) 180 MG tablet Take 180 mg by mouth daily. Patient not taking: Reported on 08/16/2022    [provider]  FLUoxetine (PROZAC) 10 MG capsule TAKE 1 CAPSULE BY MOUTH 3 TIMES DAILY. 04/30/22   Jerrol Banana., MD  furosemide (LASIX) 40 MG tablet Take 1 tablet (40 mg total) by mouth daily. 08/05/22   Wieting, Richard, MD  glucose blood test strip ACCU-CHEK AVIVA PLUS TEST STRP Use to check sugars 3 times daily. 10/28/15   [provider]  HUMALOG KWIKPEN 100 UNIT/ML KwikPen Inject 3 Units into the skin 3  (three) times daily. 08/04/22   Loletha Grayer, MD  insulin glargine (LANTUS) 100 UNIT/ML injection Inject 0.06 mLs (6 Units total) into the skin at bedtime. 08/04/22   Loletha Grayer, MD  iron polysaccharides (NIFEREX) 150 MG capsule Take 1 capsule (150 mg total) by mouth daily. Patient not taking: Reported on 08/16/2022 06/18/22   Fritzi Mandes, MD  isosorbide mononitrate (IMDUR) 30 MG 24 hr tablet Take 1 tablet (30 mg total) by mouth daily. Take 30 mg by mouth daily. 08/24/22   Myles Gip, DO  lansoprazole (PREVACID) 30 MG capsule TAKE 1 CAPSULE BY MOUTH ONCE DAILY AT NOON Patient taking differently: Take 30 mg by mouth in the morning. 06/01/22   Jerrol Banana., MD  losartan (COZAAR) 100 MG  tablet TAKE 1 TABLET BY MOUTH DAILY 06/01/22   Jerrol Banana., MD  memantine [facility]) 10 MG tablet Take 10 mg by mouth 2 (two) times daily. 12/31/21   [provider]  oxyCODONE (OXY IR/ROXICODONE) 5 MG immediate release tablet Take 1 tablet (5 mg total) by mouth every 6 (six) hours as needed for severe pain. 08/04/22   Loletha Grayer, MD  polyethylene glycol (MIRALAX / GLYCOLAX) 17 g packet Take 17 g by mouth daily as needed for mild constipation. Patient not taking: Reported on 08/16/2022 08/04/22   Loletha Grayer, MD  QUEtiapine (SEROQUEL) 50 MG tablet Take 50 mg by mouth at bedtime. 11/09/21   [provider]  vitamin E 1000 UNIT capsule Take 1,000 Units by mouth daily.    [provider]     Review of Systems  Constitutional:  Positive for fatigue. Negative for appetite change.  Eyes:  Positive for visual disturbance (limited vision).  Respiratory:  Positive for shortness of breath (very little). Negative for cough and chest tightness.   Cardiovascular:  Negative for chest pain, palpitations and leg swelling.  Gastrointestinal:  Negative for abdominal distention and abdominal pain.  Endocrine: Negative.   Genitourinary: Negative.   Musculoskeletal:   Negative for back pain and neck pain.  Skin: Negative.   Allergic/Immunologic: Negative.   Neurological:  Negative for light-headedness.  Hematological:  Negative for adenopathy. Does not bruise/bleed easily.  Psychiatric/Behavioral:  Negative for dysphoric mood and sleep disturbance (sleeping in recliner due to comfort). The patient is not nervous/anxious.    Vitals:   08/31/22 1008  BP: (!) 153/75  Pulse: 86  Resp: 16  SpO2: 97%  Weight: 132 lb (59.9 kg)   Wt Readings from Last 3 Encounters:  08/31/22 132 lb (59.9 kg)  08/16/22 126 lb (57.2 kg)  08/04/22 126 lb 12.2 oz (57.5 kg)   Lab Results  Component Value Date   CREATININE 1.33 (H) 08/04/2022   CREATININE 1.19 (H) 08/03/2022   CREATININE 1.32 (H) 08/02/2022   Physical Exam Vitals and nursing note reviewed. Exam conducted with a chaperone present (son).  Constitutional:      Appearance: Normal appearance.  HENT:     Head: Normocephalic and atraumatic.  Cardiovascular:     Rate and Rhythm: Normal rate and regular rhythm.  Pulmonary:     Effort: Pulmonary effort is normal. No respiratory distress.     Breath sounds: No wheezing or rales.  Abdominal:     General: There is no distension.     Palpations: Abdomen is soft.     Tenderness: There is no abdominal tenderness.  Musculoskeletal:        General: No tenderness.     Cervical back: Normal range of motion and neck supple.     Right lower leg: No edema.     Left lower leg: No edema.  Skin:    General: Skin is warm and dry.  Neurological:     General: No focal deficit present.     Mental Status: She is alert and oriented to person, place, and time.  Psychiatric:        Mood and Affect: Mood normal.        Behavior: Behavior normal.        Thought Content: Thought content normal.   Assessment & Plan:  1: Chronic heart failure with mildly reduced ejection fraction- - NYHA class II - euvolemic today - weighing daily with the talking scales; call for an  overnight  weight gain of > 2 pounds or a weekly weight gain of > 5 pounds - weight down 8 pounds from last visit here 2.5 months ago - not adding salt to her food and DIL doesn't cook with salt; does eat micrwaveable breakfast but she still doesn't go over '2000mg'$  - on GDMT losartan; found out today that her carvedilol was not in pill packs - sent in RX for carvedilol and son will take meds back to pharmacy and they will repack them - plan to change her losartan to entresto at next visit - walking with her walker and currently has PT coming to the home - BNP 07/30/22 was 1821.3 - PharmD reconciled medications with the patient  2: HTN- - BP mildly elevated (153/75); resuming carvedilol per above - saw PCP (O[facility]alt) 08/16/22 - BMP 08/04/22 reviewed and showed sodium 129, potassium 3.7, creatinine 1.33 and GFR 38  3: DM with CKD- - saw endocrinology (Solum) 05/18/22 - A1c 06/15/22 was 6.9% - home glucose today was 112  4: Anemia- - saw hematology Janese Banks) 06/21/22 - hemoglobin 08/04/22 was 11.3 - had iron infusion 07/06/22 - found out today that her iron tablet was not in current pill pack; refilled this today and after it was sent in, son says that patient is going to cancer center later today and he will confirm with them whether she needs to resume taking it  5: PAF- - saw cardiology Nehemiah Massed) 08/15/20; f/u has to be rescheduled   Patient did not bring her medications nor a list. Each medication was verbally reviewed with the patient and she was encouraged to bring the bottles to every visit to confirm accuracy of list.  Return in 6 weeks, sooner if needed.

## 2022-09-01 ENCOUNTER — Telehealth: Payer: Self-pay | Admitting: Physician Assistant

## 2022-09-01 NOTE — Telephone Encounter (Signed)
Home Health Verbal Orders - Caller/Agency: Marlowe Kays with [facility] Number: [phone] Requesting OT Frequency: 1 x wk 6 wks, and once every other week  Please assist further

## 2022-09-01 NOTE — Telephone Encounter (Signed)
Marlowe Kays advised of approved orders.

## 2022-09-04 ENCOUNTER — Encounter: Payer: Self-pay | Admitting: Oncology

## 2022-09-04 NOTE — Progress Notes (Signed)
Hematology/Oncology Consult note [facility]  [phone] [phone]  Patient Care Team: Mardene Speak, PA-C as PCP - General (Physician Assistant) Judi Cong, MD as Physician Assistant (Endocrinology) Estill Cotta, MD as Consulting Physician (Ophthalmology) Lucky Cowboy Erskine Squibb, MD as Referring Physician (Vascular Surgery) Corey Skains, MD as Consulting Physician (Cardiology) Ralene Bathe, MD as Consulting Physician (Dermatology) Gardiner Barefoot, DPM as Consulting Physician (Podiatry)   Name of the patient: [name]  [id number]  1929/08/01   Date of visit: 09/04/22  Diagnosis-iron deficiency anemia  Chief complaint/ Reason for visit-routine follow-up of iron deficiency anemia  Heme/Onc history: patient is a 88 year old female with a past medical history significant for type 2 diabetes hypertension hyperlipidemia CHF who has been referred forFor anemia.  Most recent labs from 06/17/2022 showed H&H of 7.9/25.5 with an MCV of 72.9.  Platelets and white count was normal.  B12 level was low at 249.  Iron studies showed low iron saturation of 4% and low serum iron of 15.  Ferritin was not checked.  Looking back at her prior CBCs patient's hemoglobin was around 10 up until May 2023.   Interval history-patient reports improvement in her energy levels after receiving IV iron in October 2023.  Denies any blood loss in her stool or urine.  ECOG PS- 2 Pain scale- 0   Review of systems- Review of Systems  Constitutional:  Negative for chills, fever, malaise/fatigue and weight loss.  HENT:  Negative for congestion, ear discharge and nosebleeds.   Eyes:  Negative for blurred vision.  Respiratory:  Negative for cough, hemoptysis, sputum production, shortness of breath and wheezing.   Cardiovascular:  Negative for chest pain, palpitations, orthopnea and claudication.  Gastrointestinal:  Negative for abdominal pain, blood in  stool, constipation, diarrhea, heartburn, melena, nausea and vomiting.  Genitourinary:  Negative for dysuria, flank pain, frequency, hematuria and urgency.  Musculoskeletal:  Negative for back pain, joint pain and myalgias.  Skin:  Negative for rash.  Neurological:  Negative for dizziness, tingling, focal weakness, seizures, weakness and headaches.  Endo/Heme/Allergies:  Does not bruise/bleed easily.  Psychiatric/Behavioral:  Negative for depression and suicidal ideas. The patient does not have insomnia.       Allergies  Allergen Reactions   Bacitracin-Neomycin-Polymyxin Rash   Neomycin-Bacitracin Zn-Polymyx Swelling and Rash   Latex Rash   Lidocaine Rash   Aricept [Donepezil Hcl] Diarrhea   Benzalkonium Chloride Itching and Swelling   Ibuprofen     Other reaction(s): Dizziness Heart fluttering. Tachycardia. Tachycardia.   Lidocaine Hcl Itching    Per pt, "all caine meds cause severe itching"   Valdecoxib Nausea And Vomiting   Albuterol Rash   Tape Rash    blisters   Triamcinolone Rash     Past Medical History:  Diagnosis Date   Anemia    Angina pectoris (HCC)    Atrial fibrillation (HCC)    Basal cell carcinoma    nose   Cervicalgia    CHF (congestive heart failure) (HCC)    Chronic back pain    Chronic kidney disease    Chronic obstructive pulmonary disease (COPD) (HCC)    COPD (chronic obstructive pulmonary disease) (HCC)    DDD (degenerative disc disease), lumbar    Diabetes mellitus without complication ([place])    Dysphagia    Hyperlipemia    Hypertension    Squamous cell carcinoma of skin    left lateral pretibial   Vulvovaginitis      Past  Surgical History:  Procedure Laterality Date   gall bladder     melanoma     removal neck and back   PERIPHERAL VASCULAR CATHETERIZATION Left 01/05/2016   Procedure: Lower Extremity Angiography;  Surgeon: Algernon Huxley, MD;  Location: [place] [facility];  Service: Cardiovascular;  Laterality: Left;   PERIPHERAL  VASCULAR CATHETERIZATION  01/05/2016   Procedure: Lower Extremity Intervention;  Surgeon: Algernon Huxley, MD;  Location: [place] [facility];  Service: Cardiovascular;;   ureterolithiasis     calculus removed   VAGINAL HYSTERECTOMY     VISCERAL ANGIOGRAPHY N/A 05/10/2019   Procedure: VISCERAL ANGIOGRAPHY;  Surgeon: Algernon Huxley, MD;  Location: [place] [facility];  Service: Cardiovascular;  Laterality: N/A;   VULVA / PERINEUM BIOPSY  05/29/2015    Social History   Socioeconomic History   Marital status: Widowed    Spouse name: Not on file   Number of children: 3   Years of education: Not on file   Highest education level: 8th grade  Occupational History   Occupation: retired  Tobacco Use   Smoking status: Never   Smokeless tobacco: Never  Vaping Use   Vaping Use: Never used  Substance and Sexual Activity   Alcohol use: No   Drug use: No   Sexual activity: Never  Other Topics Concern   Not on file  Social History Narrative   Not on file   Social Determinants of Health   Financial Resource Strain: Low Risk  (05/20/2022)   Overall Financial Resource Strain (CARDIA)    Difficulty of Paying Living Expenses: Not hard at all  Food Insecurity: No Food Insecurity (07/31/2022)   Hunger Vital Sign    Worried About Running Out of Food in the Last Year: Never true    [place] in the Last Year: Never true  Transportation Needs: No Transportation Needs (07/31/2022)   PRAPARE - Hydrologist (Medical): No    Lack of Transportation (Non-Medical): No  Physical Activity: Insufficiently Active (05/20/2022)   Exercise Vital Sign    Days of Exercise per Week: 2 days    Minutes of Exercise per Session: 20 min  Stress: No Stress Concern Present (05/20/2022)   Fif[place]Six    Feeling of Stress : Not at all  Social Connections: Moderately Isolated (05/20/2022)   Social Connection and Isolation Panel  [NHANES]    Frequency of Communication with Friends and Family: More than three times a week    Frequency of Social Gatherings with Friends and Family: More than three times a week    Attends Religious Services: More than 4 times per year    Active Member of [employer] or Organizations: No    Attends Archivist Meetings: Never    Marital Status: Widowed  Intimate Partner Violence: Not At Risk (07/31/2022)   Humiliation, Afraid, Rape, and Kick questionnaire    Fear of Current or Ex-Partner: No    Emotionally Abused: No    Physically Abused: No    Sexually Abused: No    Family History  Problem Relation Age of Onset   Diabetes Sister    Colon cancer Brother    Diabetes Brother    Atrial fibrillation Daughter    Ovarian cancer Other    Breast cancer Neg Hx      Current Outpatient Medications:    albuterol (VENTOLIN HFA) 108 (90 Base) MCG/ACT inhaler, INHALE 2  PUFFS INTO THE LUNGS EVERY 4 HOURS AS NEEDED FOR WHEEZING ORSHORTNESS OF BREATH, Disp: 8.5 g, Rfl: 10   atorvastatin (LIPITOR) 10 MG tablet, TAKE 1 TABLET BY MOUTH ONCE DAILY., Disp: 90 tablet, Rfl: 1   clopidogrel (PLAVIX) 75 MG tablet, Take 1 tablet (75 mg total) by mouth daily., Disp: 30 tablet, Rfl: 3   cyanocobalamin 1000 MCG tablet, Take 1 tablet (1,000 mcg total) by mouth daily., Disp: 30 tablet, Rfl: 2   Dupilumab (DUPIXENT) 300 MG/2ML SOPN, Inject 300 mg into the skin every 14 (fourteen) days. Starting at day 15 for maintenance., Disp: 4 mL, Rfl: 0   ezetimibe (ZETIA) 10 MG tablet, TAKE 1 TABLET BY MOUTH DAILY, Disp: 90 tablet, Rfl: 1   fexofenadine (ALLEGRA) 180 MG tablet, Take 180 mg by mouth daily., Disp: , Rfl:    FLUoxetine (PROZAC) 10 MG capsule, TAKE 1 CAPSULE BY MOUTH 3 TIMES DAILY., Disp: 90 capsule, Rfl: 3   furosemide (LASIX) 40 MG tablet, Take 1 tablet (40 mg total) by mouth daily., Disp: 30 tablet, Rfl: 0   glucose blood test strip, ACCU-CHEK AVIVA PLUS TEST STRP Use to check sugars 3 times daily., Disp:  , Rfl:    HUMALOG KWIKPEN 100 UNIT/ML KwikPen, Inject 3 Units into the skin 3 (three) times daily., Disp: 15 mL, Rfl: 11   insulin glargine (LANTUS) 100 UNIT/ML injection, Inject 0.06 mLs (6 Units total) into the skin at bedtime., Disp: 10 mL, Rfl: 11   isosorbide mononitrate (IMDUR) 30 MG 24 hr tablet, Take 1 tablet (30 mg total) by mouth daily. Take 30 mg by mouth daily., Disp: 30 tablet, Rfl: 0   lansoprazole (PREVACID) 30 MG capsule, TAKE 1 CAPSULE BY MOUTH ONCE DAILY AT NOON (Patient taking differently: Take 30 mg by mouth in the morning.), Disp: 90 capsule, Rfl: 1   losartan (COZAAR) 100 MG tablet, TAKE 1 TABLET BY MOUTH DAILY, Disp: 90 tablet, Rfl: 1   memantine (NAMENDA) 10 MG tablet, Take 10 mg by mouth 2 (two) times daily., Disp: , Rfl:    oxyCODONE (OXY IR/ROXICODONE) 5 MG immediate release tablet, Take 1 tablet (5 mg total) by mouth every 6 (six) hours as needed for severe pain., Disp: 6 tablet, Rfl: 0   polyethylene glycol (MIRALAX / GLYCOLAX) 17 g packet, Take 17 g by mouth daily as needed for mild constipation., Disp: 30 each, Rfl: 0   QUEtiapine (SEROQUEL) 50 MG tablet, Take 50 mg by mouth at bedtime., Disp: , Rfl:    vitamin E 1000 UNIT capsule, Take 1,000 Units by mouth daily., Disp: , Rfl:    acetaminophen (TYLENOL) 325 MG tablet, Take 2 tablets (650 mg total) by mouth every 6 (six) hours as needed for mild pain (or Fever >/= 101). (Patient not taking: Reported on 08/31/2022), Disp: , Rfl:    aspirin EC 81 MG tablet, Take 1 tablet (81 mg total) by mouth daily. Swallow whole. (Patient not taking: Reported on 08/31/2022), Disp: 30 tablet, Rfl: 0   carvedilol (COREG) 6.25 MG tablet, Take 1 tablet (6.25 mg total) by mouth 2 (two) times daily with a meal. (Patient not taking: Reported on 08/31/2022), Disp: 60 tablet, Rfl: 3   iron polysaccharides (NIFEREX) 150 MG capsule, Take 1 capsule (150 mg total) by mouth daily. (Patient not taking: Reported on 08/31/2022), Disp: 30 capsule, Rfl:  3  Physical exam:  Vitals:   08/31/22 1153  BP: (!) 165/83  Pulse: 81  Resp: 14  Temp: (!) 97 F (36.1 C)  SpO2: 97%  Weight: 131 lb 8 oz (59.6 kg)   Physical Exam Cardiovascular:     Rate and Rhythm: Normal rate and regular rhythm.     Heart sounds: Normal heart sounds.  Pulmonary:     Effort: Pulmonary effort is normal.     Breath sounds: Normal breath sounds.  Skin:    General: Skin is warm and dry.  Neurological:     Mental Status: She is alert and oriented to person, place, and time.         Latest Ref Rng & Units 08/04/2022    5:34 AM  CMP  Glucose 70 - 99 mg/dL 150   BUN 8 - 23 mg/dL 45   Creatinine 0.44 - 1.00 mg/dL 1.33   Sodium 135 - 145 mmol/L 129   Potassium 3.5 - 5.1 mmol/L 3.7   Chloride 98 - 111 mmol/L 93   CO2 22 - 32 mmol/L 27   Calcium 8.9 - 10.3 mg/dL 8.5       Latest Ref Rng & Units 08/31/2022   11:11 AM  CBC  WBC 4.0 - 10.5 K/uL 10.1   Hemoglobin 12.0 - 15.0 g/dL 13.2   Hematocrit 36.0 - 46.0 % 41.5   Platelets 150 - 400 K/uL 229    Assessment and plan- Patient is a 88 y.o. female here for routine follow-up of iron and B12 deficiency anemia  Patient is not presently anemic with an H&H of 13.2/41.5 which is improved from 11.3 after receiving IV iron.  Iron studies are within normal limits and B12 levels are normal at 543.  I will repeat CBC ferritin and iron studies in 3 and 6 months and I will see her back in 6 months   Visit Diagnosis 1. Iron deficiency anemia, unspecified iron deficiency anemia type   2. B12 deficiency      Dr. Randa Evens, MD, MPH [facility] [phone] 09/04/2022 4:47 PM

## 2022-09-07 ENCOUNTER — Ambulatory Visit: Payer: Medicare Other | Admitting: Family Medicine

## 2022-09-29 DIAGNOSIS — Z7902 Long term (current) use of antithrombotics/antiplatelets: Secondary | ICD-10-CM | POA: Diagnosis not present

## 2022-09-29 DIAGNOSIS — Z86718 Personal history of other venous thrombosis and embolism: Secondary | ICD-10-CM | POA: Diagnosis not present

## 2022-09-29 DIAGNOSIS — I130 Hypertensive heart and chronic kidney disease with heart failure and stage 1 through stage 4 chronic kidney disease, or unspecified chronic kidney disease: Secondary | ICD-10-CM | POA: Diagnosis not present

## 2022-09-29 DIAGNOSIS — E1151 Type 2 diabetes mellitus with diabetic peripheral angiopathy without gangrene: Secondary | ICD-10-CM | POA: Diagnosis not present

## 2022-09-29 DIAGNOSIS — E1122 Type 2 diabetes mellitus with diabetic chronic kidney disease: Secondary | ICD-10-CM | POA: Diagnosis not present

## 2022-09-29 DIAGNOSIS — D508 Other iron deficiency anemias: Secondary | ICD-10-CM | POA: Diagnosis not present

## 2022-09-29 DIAGNOSIS — K219 Gastro-esophageal reflux disease without esophagitis: Secondary | ICD-10-CM | POA: Diagnosis not present

## 2022-09-29 DIAGNOSIS — Z9181 History of falling: Secondary | ICD-10-CM | POA: Diagnosis not present

## 2022-09-29 DIAGNOSIS — D631 Anemia in chronic kidney disease: Secondary | ICD-10-CM | POA: Diagnosis not present

## 2022-09-29 DIAGNOSIS — J449 Chronic obstructive pulmonary disease, unspecified: Secondary | ICD-10-CM | POA: Diagnosis not present

## 2022-09-29 DIAGNOSIS — Z794 Long term (current) use of insulin: Secondary | ICD-10-CM | POA: Diagnosis not present

## 2022-09-29 DIAGNOSIS — N1831 Chronic kidney disease, stage 3a: Secondary | ICD-10-CM | POA: Diagnosis not present

## 2022-09-29 DIAGNOSIS — E782 Mixed hyperlipidemia: Secondary | ICD-10-CM | POA: Diagnosis not present

## 2022-09-29 DIAGNOSIS — Z7982 Long term (current) use of aspirin: Secondary | ICD-10-CM | POA: Diagnosis not present

## 2022-09-29 DIAGNOSIS — J9691 Respiratory failure, unspecified with hypoxia: Secondary | ICD-10-CM | POA: Diagnosis not present

## 2022-09-29 DIAGNOSIS — I5042 Chronic combined systolic (congestive) and diastolic (congestive) heart failure: Secondary | ICD-10-CM | POA: Diagnosis not present

## 2022-09-29 DIAGNOSIS — F0283 Dementia in other diseases classified elsewhere, unspecified severity, with mood disturbance: Secondary | ICD-10-CM | POA: Diagnosis not present

## 2022-09-29 DIAGNOSIS — I2510 Atherosclerotic heart disease of native coronary artery without angina pectoris: Secondary | ICD-10-CM | POA: Diagnosis not present

## 2022-09-30 ENCOUNTER — Ambulatory Visit: Payer: Medicare Other | Admitting: Dermatology

## 2022-09-30 DIAGNOSIS — L209 Atopic dermatitis, unspecified: Secondary | ICD-10-CM | POA: Diagnosis not present

## 2022-09-30 DIAGNOSIS — Z79899 Other long term (current) drug therapy: Secondary | ICD-10-CM

## 2022-09-30 DIAGNOSIS — L820 Inflamed seborrheic keratosis: Secondary | ICD-10-CM | POA: Diagnosis not present

## 2022-09-30 DIAGNOSIS — L578 Other skin changes due to chronic exposure to nonionizing radiation: Secondary | ICD-10-CM | POA: Diagnosis not present

## 2022-09-30 DIAGNOSIS — D485 Neoplasm of uncertain behavior of skin: Secondary | ICD-10-CM

## 2022-09-30 DIAGNOSIS — C44311 Basal cell carcinoma of skin of nose: Secondary | ICD-10-CM

## 2022-09-30 DIAGNOSIS — C4491 Basal cell carcinoma of skin, unspecified: Secondary | ICD-10-CM

## 2022-09-30 DIAGNOSIS — L570 Actinic keratosis: Secondary | ICD-10-CM

## 2022-09-30 DIAGNOSIS — Z85828 Personal history of other malignant neoplasm of skin: Secondary | ICD-10-CM

## 2022-09-30 HISTORY — DX: Basal cell carcinoma of skin, unspecified: C44.91

## 2022-09-30 MED ORDER — DUPIXENT 300 MG/2ML ~~LOC~~ SOAJ: 300.0000 mg | SUBCUTANEOUS | 5 refills | Status: DC

## 2022-09-30 NOTE — Patient Instructions (Addendum)
Wound Care Instructions  Cleanse wound gently with soap and water once a day then pat dry with clean gauze. Apply a thin coat of Petrolatum (petroleum jelly, "Vaseline") over the wound (unless you have an allergy to this). We recommend that you use a new, sterile tube of Vaseline. Do not pick or remove scabs. Do not remove the yellow or white "healing tissue" from the base of the wound.  Cover the wound with fresh, clean, nonstick gauze and secure with paper tape. You may use Band-Aids in place of gauze and tape if the wound is small enough, but would recommend trimming much of the tape off as there is often too much. Sometimes Band-Aids can irritate the skin.  You should call the office for your biopsy report after 1 week if you have not already been contacted.  If you experience any problems, such as abnormal amounts of bleeding, swelling, significant bruising, significant pain, or evidence of infection, please call the office immediately.  FOR ADULT SURGERY PATIENTS: If you need something for pain relief you may take 1 extra strength Tylenol (acetaminophen) AND 2 Ibuprofen ('200mg'$  each) together every 4 hours as needed for pain. (do not take these if you are allergic to them or if you have a reason you should not take them.) Typically, you may only need pain medication for 1 to 3 days.     Dupilumab (Dupixent) is a treatment given by injection for adults and children with moderate-to-severe atopic dermatitis. Goal is control of skin condition, not cure. It is given as 2 injections at the first dose followed by 1 injection ever 2 weeks thereafter.  Young children are dosed monthly.  Potential side effects include allergic reaction, herpes infections, injection site reactions and conjunctivitis (inflammation of the eyes).  The use of Dupixent requires long term medication management, including periodic office visits.   Due to recent changes in healthcare laws, you may see results of your pathology  and/or laboratory studies on MyChart before the doctors have had a chance to review them. We understand that in some cases there may be results that are confusing or concerning to you. Please understand that not all results are received at the same time and often the doctors may need to interpret multiple results in order to provide you with the best plan of care or course of treatment. Therefore, we ask that you please give [place] 2 business days to thoroughly review all your results before contacting the office for clarification. Should we see a critical lab result, you will be contacted sooner.   If You Need Anything After Your Visit  If you have any questions or concerns for your doctor, please call our main line at [phone] and press option 4 to reach your doctor's medical assistant. If no one answers, please leave a voicemail as directed and we will return your call as soon as possible. Messages left after 4 pm will be answered the following business day.   You may also send [place] a message via [place]. We typically respond to MyChart messages within 1-2 business days.  For prescription refills, please ask your pharmacy to contact our office. Our fax number is [phone].  If you have an urgent issue when the clinic is closed that cannot wait until the next business day, you can page your doctor at the number below.    Please note that while we do our best to be available for urgent issues outside of office hours, we are not available  24/7.   If you have an urgent issue and are unable to reach [place], you may choose to seek medical care at your doctor's office, retail clinic, urgent care center, or emergency room.  If you have a medical emergency, please immediately call 911 or go to the emergency department.  Pager Numbers  - Dr. Nehemiah Massed: [phone]  - Dr. Laurence Ferrari: [phone]  - Dr. Nicole Kindred: [phone]  In the event of inclement weather, please call our main line at [phone] for  an update on the status of any delays or closures.  Dermatology Medication Tips: Please keep the boxes that topical medications come in in order to help keep track of the instructions about where and how to use these. Pharmacies typically print the medication instructions only on the boxes and not directly on the medication tubes.   If your medication is too expensive, please contact our office at [phone] option 4 or send [place] a message through [place].   We are unable to tell what your co-pay for medications will be in advance as this is different depending on your insurance coverage. However, we may be able to find a substitute medication at lower cost or fill out paperwork to get insurance to cover a needed medication.   If a prior authorization is required to get your medication covered by your insurance company, please allow [place] 1-2 business days to complete this process.  Drug prices often vary depending on where the prescription is filled and some pharmacies may offer cheaper prices.  The website www.goodrx.com contains coupons for medications through different pharmacies. The prices here do not account for what the cost may be with help from insurance (it may be cheaper with your insurance), but the website can give you the price if you did not use any insurance.  - You can print the associated coupon and take it with your prescription to the pharmacy.  - You may also stop by our office during regular business hours and pick up a GoodRx coupon card.  - If you need your prescription sent electronically to a different pharmacy, notify our office through [facility] or by phone at [phone] option 4.     Si Usted Necesita Algo Despus de Su Visita  Tambin puede enviarnos un mensaje a travs de Pharmacist, community. Por lo general respondemos a los mensajes de MyChart en el transcurso de 1 a 2 das hbiles.  Para renovar recetas, por favor pida a su farmacia que se ponga en contacto con  nuestra oficina. Harland Dingwall de fax es [place] [phone].  Si tiene un asunto urgente cuando la clnica est cerrada y que no puede esperar hasta el siguiente da hbil, puede llamar/localizar a su doctor(a) al nmero que aparece a continuacin.   Por favor, tenga en cuenta que aunque hacemos todo lo posible para estar disponibles para asuntos urgentes fuera del horario de [place], no estamos disponibles las 24 horas del da, los 7 das de la [place].   Si tiene un problema urgente y no puede comunicarse con nosotros, puede optar por buscar atencin mdica  en el consultorio de su doctor(a), en una clnica privada, en un centro de atencin urgente o en una sala de emergencias.  Si tiene Engineering geologist, por favor llame inmediatamente al 911 o vaya a la sala de emergencias.  Nmeros de bper  - Dr. Nehemiah Massed: [phone]  - Dra. Moye: [phone]  - Dra. Nicole Kindred: [phone]  En caso de inclemencias del tiempo, por favor llame a  Johnsie Kindred principal al [phone] para una actualizacin sobre el [place] de cualquier retraso o cierre.  Consejos para la medicacin en dermatologa: Por favor, guarde las cajas en las que vienen los medicamentos de uso tpico para ayudarle a seguir las instrucciones sobre dnde y cmo usarlos. Las farmacias generalmente imprimen las instrucciones del medicamento slo en las cajas y no directamente en los tubos del [place].   Si su medicamento es muy caro, por favor, pngase en contacto con Zigmund Daniel llamando al [phone] y presione la opcin 4 o envenos un mensaje a travs de Pharmacist, community.   No podemos decirle cul ser su copago por los medicamentos por adelantado ya que esto es diferente dependiendo de la cobertura de su seguro. Sin embargo, es posible que podamos encontrar un medicamento sustituto a Electrical engineer un formulario para que el seguro cubra el medicamento que se considera necesario.   Si se requiere una autorizacin  previa para que su compaa de seguros [place] su medicamento, por favor permtanos de 1 a 2 das hbiles para completar este proceso.  Los precios de los medicamentos varan con frecuencia dependiendo del Environmental consultant de dnde se surte la receta y alguna farmacias pueden ofrecer precios ms baratos.  El sitio web www.goodrx.com tiene cupones para medicamentos de Airline pilot. Los precios aqu no tienen en cuenta lo que podra costar con la ayuda del seguro (puede ser ms barato con su seguro), pero el sitio web puede darle el precio si no utiliz Research scientist (physical sciences).  - Puede imprimir el cupn correspondiente y llevarlo con su receta a la farmacia.  - Tambin puede pasar por nuestra oficina durante el horario de atencin regular y Charity fundraiser una tarjeta de cupones de GoodRx.  - Si necesita que su receta se enve electrnicamente a una farmacia diferente, informe a nuestra oficina a travs de MyChart de [place] o por telfono llamando al [phone] y presione la opcin 4.

## 2022-09-30 NOTE — Progress Notes (Unsigned)
Follow-Up Visit   Subjective  [name] is a 89 y.o. female who presents for the following: Dermatitis (6 month follow-up. Patient is doing well and clear on Dupixent injections, last shot was 1 week ago. She has no itching and no breakouts. She is not using any topical treatment. ). Hx of skin cancer nose in past. The patient has spots, moles and lesions to be evaluated, some may be new or changing and the patient has concerns that these could be cancer.  Patient accompanied by son, Jeneen Rinks.   The following portions of the chart were reviewed this encounter and updated as appropriate:   Tobacco  Allergies  Meds  Problems  Med Hx  Surg Hx  Fam Hx     Review of Systems:  No other skin or systemic complaints except as noted in HPI or Assessment and Plan.  Objective  Well appearing patient in no apparent distress; mood and affect are within normal limits.  A focused examination was performed including face, trunk, arms. Relevant physical exam findings are noted in the Assessment and Plan.  trunk; extremities Trunk, arms clear  dorsum nose 0.4cm pink papule     face (7) Erythematous thin papules/macules with gritty scale.   face (5) Erythematous stuck-on, waxy papule or plaque   Assessment & Plan  Actinic Damage - chronic, secondary to cumulative UV radiation exposure/sun exposure over time - diffuse scaly erythematous macules with underlying dyspigmentation - Recommend daily broad spectrum sunscreen SPF 30+ to sun-exposed areas, reapply every 2 hours as needed.  - Recommend staying in the shade or wearing long sleeves, sun glasses (UVA+UVB protection) and wide brim hats (4-inch brim around the entire circumference of the hat). - Call for new or changing lesions.  History of Basal Cell Carcinoma of the Skin - No evidence of recurrence today - Recommend regular full body skin exams - Recommend daily broad spectrum sunscreen SPF 30+ to sun-exposed areas,  reapply every 2 hours as needed.  - Call if any new or changing lesions are noted between office visits  Atopic dermatitis,  trunk; extremities Chronic and persistent condition with duration or expected duration over one year. Condition is symptomatic / bothersome to patient. Much improved since starting Dupixent injections.  Atopic dermatitis - Severe, on Dupixent (biologic medication).  Atopic dermatitis (eczema) is a chronic, relapsing, pruritic condition that can significantly affect quality of life. It is often associated with allergic rhinitis and/or asthma and can require treatment with topical medications, phototherapy, or in severe cases a biologic medication called Dupixent, which requires long term medication management.   Continue Dupixent '300mg'$ /26m SQ QOW dsp [id number]. Dupilumab (Dupixent) is a treatment given by injection for adults and children with moderate-to-severe atopic dermatitis. Goal is control of skin condition, not cure. It is given as 2 injections at the first dose followed by 1 injection ever 2 weeks thereafter.  Young children are dosed monthly.   Potential side effects include allergic reaction, herpes infections, injection site reactions and conjunctivitis (inflammation of the eyes).  The use of Dupixent requires long term medication management, including periodic office visits.  Related Medications Dupilumab (DUPIXENT) 300 MG/2ML SOPN Inject 300 mg into the skin every 14 (fourteen) days. Starting at day 15 for maintenance.  Neoplasm of uncertain behavior of skin dorsum nose Skin / nail biopsy Type of biopsy: tangential   Informed consent: discussed and consent obtained   Timeout: patient name, date of birth, surgical site, and procedure verified   Procedure  prep:  Patient was prepped and draped in usual sterile fashion Prep type:  Isopropyl alcohol Anesthesia: the lesion was anesthetized in a standard fashion   Anesthetic:  1% lidocaine w/ epinephrine  1-100,000 buffered w/ 8.4% NaHCO3 Instrument used: flexible razor blade   Outcome: patient tolerated procedure well   Post-procedure details: sterile dressing applied and wound care instructions given   Dressing type: bandage and bacitracin    Destruction of lesion Complexity: extensive   Destruction method: electrodesiccation and curettage   Informed consent: discussed and consent obtained   Timeout:  patient name, date of birth, surgical site, and procedure verified Procedure prep:  Patient was prepped and draped in usual sterile fashion Prep type:  Isopropyl alcohol Anesthesia: the lesion was anesthetized in a standard fashion   Anesthetic:  1% lidocaine w/ epinephrine 1-100,000 buffered w/ 8.4% NaHCO3 Curettage performed in three different directions: Yes   Electrodesiccation performed over the curetted area: Yes   Lesion length (cm):  0.4 Lesion width (cm):  0.4 Margin per side (cm):  0.2 Final wound size (cm):  0.8 Hemostasis achieved with:  pressure, aluminum chloride and electrodesiccation Outcome: patient tolerated procedure well with no complications   Post-procedure details: sterile dressing applied and wound care instructions given   Dressing type: bandage and petrolatum    Specimen 1 - Surgical pathology Differential Diagnosis: BCC vs other Check Margins: No EDC today  Biopsy and EDC performed today.   AK (actinic keratosis) (7) face Actinic keratoses are precancerous spots that appear secondary to cumulative UV radiation exposure/sun exposure over time. They are chronic with expected duration over 1 year. A portion of actinic keratoses will progress to squamous cell carcinoma of the skin. It is not possible to reliably predict which spots will progress to skin cancer and so treatment is recommended to prevent development of skin cancer.  Recommend daily broad spectrum sunscreen SPF 30+ to sun-exposed areas, reapply every 2 hours as needed.  Recommend staying in the  shade or wearing long sleeves, sun glasses (UVA+UVB protection) and wide brim hats (4-inch brim around the entire circumference of the hat). Call for new or changing lesions.  Destruction of lesion - face Complexity: simple   Destruction method: cryotherapy   Informed consent: discussed and consent obtained   Timeout:  patient name, date of birth, surgical site, and procedure verified Lesion destroyed using liquid nitrogen: Yes   Region frozen until ice ball extended beyond lesion: Yes   Outcome: patient tolerated procedure well with no complications   Post-procedure details: wound care instructions given    Inflamed seborrheic keratosis (5) face Symptomatic, irritating, patient would like treated. Destruction of lesion - face Complexity: simple   Destruction method: cryotherapy   Informed consent: discussed and consent obtained   Timeout:  patient name, date of birth, surgical site, and procedure verified Lesion destroyed using liquid nitrogen: Yes   Region frozen until ice ball extended beyond lesion: Yes   Outcome: patient tolerated procedure well with no complications   Post-procedure details: wound care instructions given    Return in about 6 months (around 03/31/2023) for Atopic Dermatitis, Dupixent, f/u bx.  Lindi Adie, CMA, am acting as scribe for Sarina Ser, MD . Documentation: I have reviewed the above documentation for accuracy and completeness, and I agree with the above.  Sarina Ser, MD

## 2022-10-01 ENCOUNTER — Telehealth: Payer: Self-pay | Admitting: Physician Assistant

## 2022-10-01 DIAGNOSIS — E7800 Pure hypercholesterolemia, unspecified: Secondary | ICD-10-CM

## 2022-10-01 MED ORDER — ATORVASTATIN CALCIUM 10 MG PO TABS: 10.0000 mg | ORAL_TABLET | Freq: Every day | ORAL | 0 refills | Status: DC

## 2022-10-01 NOTE — Telephone Encounter (Signed)
[facility] pharmacy faxed refill request for the following medications:    atorvastatin (LIPITOR) 10 MG tablet    Please advise

## 2022-10-02 ENCOUNTER — Encounter: Payer: Self-pay | Admitting: Dermatology

## 2022-10-04 DIAGNOSIS — E782 Mixed hyperlipidemia: Secondary | ICD-10-CM | POA: Diagnosis not present

## 2022-10-04 DIAGNOSIS — J449 Chronic obstructive pulmonary disease, unspecified: Secondary | ICD-10-CM | POA: Diagnosis not present

## 2022-10-04 DIAGNOSIS — I130 Hypertensive heart and chronic kidney disease with heart failure and stage 1 through stage 4 chronic kidney disease, or unspecified chronic kidney disease: Secondary | ICD-10-CM | POA: Diagnosis not present

## 2022-10-04 DIAGNOSIS — Z794 Long term (current) use of insulin: Secondary | ICD-10-CM | POA: Diagnosis not present

## 2022-10-04 DIAGNOSIS — I2510 Atherosclerotic heart disease of native coronary artery without angina pectoris: Secondary | ICD-10-CM | POA: Diagnosis not present

## 2022-10-04 DIAGNOSIS — N1831 Chronic kidney disease, stage 3a: Secondary | ICD-10-CM | POA: Diagnosis not present

## 2022-10-04 DIAGNOSIS — Z7982 Long term (current) use of aspirin: Secondary | ICD-10-CM | POA: Diagnosis not present

## 2022-10-04 DIAGNOSIS — I5042 Chronic combined systolic (congestive) and diastolic (congestive) heart failure: Secondary | ICD-10-CM | POA: Diagnosis not present

## 2022-10-04 DIAGNOSIS — J9691 Respiratory failure, unspecified with hypoxia: Secondary | ICD-10-CM | POA: Diagnosis not present

## 2022-10-04 DIAGNOSIS — F0283 Dementia in other diseases classified elsewhere, unspecified severity, with mood disturbance: Secondary | ICD-10-CM | POA: Diagnosis not present

## 2022-10-04 DIAGNOSIS — Z9181 History of falling: Secondary | ICD-10-CM | POA: Diagnosis not present

## 2022-10-04 DIAGNOSIS — Z7902 Long term (current) use of antithrombotics/antiplatelets: Secondary | ICD-10-CM | POA: Diagnosis not present

## 2022-10-04 DIAGNOSIS — E1151 Type 2 diabetes mellitus with diabetic peripheral angiopathy without gangrene: Secondary | ICD-10-CM | POA: Diagnosis not present

## 2022-10-04 DIAGNOSIS — E1122 Type 2 diabetes mellitus with diabetic chronic kidney disease: Secondary | ICD-10-CM | POA: Diagnosis not present

## 2022-10-04 DIAGNOSIS — Z86718 Personal history of other venous thrombosis and embolism: Secondary | ICD-10-CM | POA: Diagnosis not present

## 2022-10-04 DIAGNOSIS — D508 Other iron deficiency anemias: Secondary | ICD-10-CM | POA: Diagnosis not present

## 2022-10-04 DIAGNOSIS — K219 Gastro-esophageal reflux disease without esophagitis: Secondary | ICD-10-CM | POA: Diagnosis not present

## 2022-10-04 DIAGNOSIS — D631 Anemia in chronic kidney disease: Secondary | ICD-10-CM | POA: Diagnosis not present

## 2022-10-05 DIAGNOSIS — N1831 Chronic kidney disease, stage 3a: Secondary | ICD-10-CM | POA: Diagnosis not present

## 2022-10-05 DIAGNOSIS — J9691 Respiratory failure, unspecified with hypoxia: Secondary | ICD-10-CM | POA: Diagnosis not present

## 2022-10-05 DIAGNOSIS — D631 Anemia in chronic kidney disease: Secondary | ICD-10-CM | POA: Diagnosis not present

## 2022-10-05 DIAGNOSIS — Z86718 Personal history of other venous thrombosis and embolism: Secondary | ICD-10-CM | POA: Diagnosis not present

## 2022-10-05 DIAGNOSIS — Z9181 History of falling: Secondary | ICD-10-CM | POA: Diagnosis not present

## 2022-10-05 DIAGNOSIS — I130 Hypertensive heart and chronic kidney disease with heart failure and stage 1 through stage 4 chronic kidney disease, or unspecified chronic kidney disease: Secondary | ICD-10-CM | POA: Diagnosis not present

## 2022-10-05 DIAGNOSIS — Z7982 Long term (current) use of aspirin: Secondary | ICD-10-CM | POA: Diagnosis not present

## 2022-10-05 DIAGNOSIS — E1151 Type 2 diabetes mellitus with diabetic peripheral angiopathy without gangrene: Secondary | ICD-10-CM | POA: Diagnosis not present

## 2022-10-05 DIAGNOSIS — E782 Mixed hyperlipidemia: Secondary | ICD-10-CM | POA: Diagnosis not present

## 2022-10-05 DIAGNOSIS — I5042 Chronic combined systolic (congestive) and diastolic (congestive) heart failure: Secondary | ICD-10-CM | POA: Diagnosis not present

## 2022-10-05 DIAGNOSIS — D508 Other iron deficiency anemias: Secondary | ICD-10-CM | POA: Diagnosis not present

## 2022-10-05 DIAGNOSIS — E1122 Type 2 diabetes mellitus with diabetic chronic kidney disease: Secondary | ICD-10-CM | POA: Diagnosis not present

## 2022-10-05 DIAGNOSIS — Z7902 Long term (current) use of antithrombotics/antiplatelets: Secondary | ICD-10-CM | POA: Diagnosis not present

## 2022-10-05 DIAGNOSIS — F0283 Dementia in other diseases classified elsewhere, unspecified severity, with mood disturbance: Secondary | ICD-10-CM | POA: Diagnosis not present

## 2022-10-05 DIAGNOSIS — J449 Chronic obstructive pulmonary disease, unspecified: Secondary | ICD-10-CM | POA: Diagnosis not present

## 2022-10-05 DIAGNOSIS — Z794 Long term (current) use of insulin: Secondary | ICD-10-CM | POA: Diagnosis not present

## 2022-10-05 DIAGNOSIS — I2510 Atherosclerotic heart disease of native coronary artery without angina pectoris: Secondary | ICD-10-CM | POA: Diagnosis not present

## 2022-10-05 DIAGNOSIS — K219 Gastro-esophageal reflux disease without esophagitis: Secondary | ICD-10-CM | POA: Diagnosis not present

## 2022-10-06 ENCOUNTER — Telehealth: Payer: Self-pay

## 2022-10-06 NOTE — Telephone Encounter (Signed)
-----   Message from Ralene Bathe, MD sent at 10/06/2022  1:52 PM EST ----- Diagnosis Skin , dorsum nose BASAL CELL CARCINOMA, NODULAR PATTERN  Cancer - BCC Already treated Recheck next visit

## 2022-10-06 NOTE — Telephone Encounter (Signed)
Advised patient's son of pathology results/hd

## 2022-10-12 ENCOUNTER — Encounter: Payer: Medicare Other | Admitting: Family

## 2022-10-13 DIAGNOSIS — I130 Hypertensive heart and chronic kidney disease with heart failure and stage 1 through stage 4 chronic kidney disease, or unspecified chronic kidney disease: Secondary | ICD-10-CM | POA: Diagnosis not present

## 2022-10-13 DIAGNOSIS — I5042 Chronic combined systolic (congestive) and diastolic (congestive) heart failure: Secondary | ICD-10-CM | POA: Diagnosis not present

## 2022-10-13 DIAGNOSIS — F0283 Dementia in other diseases classified elsewhere, unspecified severity, with mood disturbance: Secondary | ICD-10-CM | POA: Diagnosis not present

## 2022-10-13 DIAGNOSIS — Z86718 Personal history of other venous thrombosis and embolism: Secondary | ICD-10-CM | POA: Diagnosis not present

## 2022-10-13 DIAGNOSIS — E782 Mixed hyperlipidemia: Secondary | ICD-10-CM | POA: Diagnosis not present

## 2022-10-13 DIAGNOSIS — Z794 Long term (current) use of insulin: Secondary | ICD-10-CM | POA: Diagnosis not present

## 2022-10-13 DIAGNOSIS — Z9181 History of falling: Secondary | ICD-10-CM | POA: Diagnosis not present

## 2022-10-13 DIAGNOSIS — E1151 Type 2 diabetes mellitus with diabetic peripheral angiopathy without gangrene: Secondary | ICD-10-CM | POA: Diagnosis not present

## 2022-10-13 DIAGNOSIS — N1831 Chronic kidney disease, stage 3a: Secondary | ICD-10-CM | POA: Diagnosis not present

## 2022-10-13 DIAGNOSIS — Z7982 Long term (current) use of aspirin: Secondary | ICD-10-CM | POA: Diagnosis not present

## 2022-10-13 DIAGNOSIS — D508 Other iron deficiency anemias: Secondary | ICD-10-CM | POA: Diagnosis not present

## 2022-10-13 DIAGNOSIS — D631 Anemia in chronic kidney disease: Secondary | ICD-10-CM | POA: Diagnosis not present

## 2022-10-13 DIAGNOSIS — J449 Chronic obstructive pulmonary disease, unspecified: Secondary | ICD-10-CM | POA: Diagnosis not present

## 2022-10-13 DIAGNOSIS — K219 Gastro-esophageal reflux disease without esophagitis: Secondary | ICD-10-CM | POA: Diagnosis not present

## 2022-10-13 DIAGNOSIS — J9691 Respiratory failure, unspecified with hypoxia: Secondary | ICD-10-CM | POA: Diagnosis not present

## 2022-10-13 DIAGNOSIS — Z7902 Long term (current) use of antithrombotics/antiplatelets: Secondary | ICD-10-CM | POA: Diagnosis not present

## 2022-10-13 DIAGNOSIS — E1122 Type 2 diabetes mellitus with diabetic chronic kidney disease: Secondary | ICD-10-CM | POA: Diagnosis not present

## 2022-10-13 DIAGNOSIS — I2510 Atherosclerotic heart disease of native coronary artery without angina pectoris: Secondary | ICD-10-CM | POA: Diagnosis not present

## 2022-10-18 DIAGNOSIS — Z9181 History of falling: Secondary | ICD-10-CM | POA: Diagnosis not present

## 2022-10-18 DIAGNOSIS — I5042 Chronic combined systolic (congestive) and diastolic (congestive) heart failure: Secondary | ICD-10-CM | POA: Diagnosis not present

## 2022-10-18 DIAGNOSIS — J449 Chronic obstructive pulmonary disease, unspecified: Secondary | ICD-10-CM | POA: Diagnosis not present

## 2022-10-18 DIAGNOSIS — Z7902 Long term (current) use of antithrombotics/antiplatelets: Secondary | ICD-10-CM | POA: Diagnosis not present

## 2022-10-18 DIAGNOSIS — I2510 Atherosclerotic heart disease of native coronary artery without angina pectoris: Secondary | ICD-10-CM | POA: Diagnosis not present

## 2022-10-18 DIAGNOSIS — I130 Hypertensive heart and chronic kidney disease with heart failure and stage 1 through stage 4 chronic kidney disease, or unspecified chronic kidney disease: Secondary | ICD-10-CM | POA: Diagnosis not present

## 2022-10-18 DIAGNOSIS — D508 Other iron deficiency anemias: Secondary | ICD-10-CM | POA: Diagnosis not present

## 2022-10-18 DIAGNOSIS — Z7982 Long term (current) use of aspirin: Secondary | ICD-10-CM | POA: Diagnosis not present

## 2022-10-18 DIAGNOSIS — F0283 Dementia in other diseases classified elsewhere, unspecified severity, with mood disturbance: Secondary | ICD-10-CM | POA: Diagnosis not present

## 2022-10-18 DIAGNOSIS — E1122 Type 2 diabetes mellitus with diabetic chronic kidney disease: Secondary | ICD-10-CM | POA: Diagnosis not present

## 2022-10-18 DIAGNOSIS — Z794 Long term (current) use of insulin: Secondary | ICD-10-CM | POA: Diagnosis not present

## 2022-10-18 DIAGNOSIS — E1151 Type 2 diabetes mellitus with diabetic peripheral angiopathy without gangrene: Secondary | ICD-10-CM | POA: Diagnosis not present

## 2022-10-18 DIAGNOSIS — Z86718 Personal history of other venous thrombosis and embolism: Secondary | ICD-10-CM | POA: Diagnosis not present

## 2022-10-18 DIAGNOSIS — D631 Anemia in chronic kidney disease: Secondary | ICD-10-CM | POA: Diagnosis not present

## 2022-10-18 DIAGNOSIS — E782 Mixed hyperlipidemia: Secondary | ICD-10-CM | POA: Diagnosis not present

## 2022-10-18 DIAGNOSIS — K219 Gastro-esophageal reflux disease without esophagitis: Secondary | ICD-10-CM | POA: Diagnosis not present

## 2022-10-18 DIAGNOSIS — N1831 Chronic kidney disease, stage 3a: Secondary | ICD-10-CM | POA: Diagnosis not present

## 2022-10-18 DIAGNOSIS — J9691 Respiratory failure, unspecified with hypoxia: Secondary | ICD-10-CM | POA: Diagnosis not present

## 2022-10-18 NOTE — Progress Notes (Signed)
Patient ID: [name], female    DOB: Sep 12, 1929, 89 y.o.   MRN: [id number]  HPI  [name] is [name] 89 y/o female with [name] history of DM, hyperlipidemia, HTN, CKD, anemia, PAF, COPD, dysphagia and chronic heart failure.   Echo report from 08/02/22 showed an EF of 45-50% along with mild LVH/ LAE and moderate MR. Echo report from 06/15/22 reviewed and showed an EF of 45-50% along with mild LVH/ LAE and mild MR.   Admitted 07/30/22 due to acute on chronic heart failure. BNP elevated. Initially given IV lasix with transition to oral diuretics. Negative for DVT. Spironolactone held due to rising creatinine. PT/OT evaluation done. Discharged after 5 days. Was in the ED 06/23/22 due to COPD exacerbation where she was treated and released. Admitted 06/15/22 due to cough and shortness of breath. Lasix increased but then held due to worsening renal function. Cardizem stopped. Discharged after 3 days.   She presents today for [name] follow-up visit with [name] chief complaint of minimal fatigue with moderate exertion. Describes this as chronic in nature. Has associated cough, shortness of breath and decreased vision along with this. Denies any difficulty sleeping, dizziness, abdominal distention, palpitations, pedal edema, chest pain or weight gain.   Weighing daily with [name] talking scale and she says that this is working out well for her.   Getting her meds pre-packaged from [facility] on [name] monthly basis. Son that is present with her thinks they have [name] couple weeks worth left of packaged medications.   Past Medical History:  Diagnosis Date   Anemia    Angina pectoris (HCC)    Atrial fibrillation (HCC)    Basal cell carcinoma 09/30/2022   dorsum nose - [facility]   Cervicalgia    CHF (congestive heart failure) (HCC)    Chronic back pain    Chronic kidney disease    Chronic obstructive pulmonary disease (COPD) (HCC)    COPD (chronic obstructive pulmonary disease) (HCC)    DDD (degenerative disc disease),  lumbar    Diabetes mellitus without complication (HCC)    Dysphagia    Hyperlipemia    Hypertension    Squamous cell carcinoma of skin    left lateral pretibial   Vulvovaginitis    Past Surgical History:  Procedure Laterality Date   gall bladder     melanoma     removal neck and back   PERIPHERAL VASCULAR CATHETERIZATION Left 01/05/2016   Procedure: Lower Extremity Angiography;  Surgeon: Tammy Huxley, [name];  Location: [facility];  Service: Cardiovascular;  Laterality: Left;   PERIPHERAL VASCULAR CATHETERIZATION  01/05/2016   Procedure: Lower Extremity Intervention;  Surgeon: Tammy Huxley, [name];  Location: [facility];  Service: Cardiovascular;;   ureterolithiasis     calculus removed   VAGINAL HYSTERECTOMY     VISCERAL ANGIOGRAPHY N/[name] 05/10/2019   Procedure: VISCERAL ANGIOGRAPHY;  Surgeon: Tammy Huxley, [name];  Location: [facility];  Service: Cardiovascular;  Laterality: N/[name];   VULVA / PERINEUM BIOPSY  05/29/2015   Family History  Problem Relation Age of Onset   Diabetes Sister    Colon cancer Brother    Diabetes Brother    Atrial fibrillation Daughter    Ovarian cancer Other    Breast cancer Neg Hx    Social History   Tobacco Use   Smoking status: Never   Smokeless tobacco: Never  Substance Use Topics   Alcohol use: No   Allergies  Allergen  Reactions   Bacitracin-Neomycin-Polymyxin Rash   Neomycin-Bacitracin Zn-Polymyx Swelling and Rash   Latex Rash   Lidocaine Rash   Aricept [Donepezil Hcl] Diarrhea   Benzalkonium Chloride Itching and Swelling   Ibuprofen     Other reaction(s): Dizziness Heart fluttering. Tachycardia. Tachycardia.   Lidocaine Hcl Itching    Per pt, "all caine meds cause severe itching"   Valdecoxib Nausea And Vomiting   Albuterol Rash   Tape Rash    blisters   Triamcinolone Rash   Prior to Admission medications   Medication Sig Start Date End Date Taking? Authorizing Provider  acetaminophen (TYLENOL) 325 MG tablet  Take 2 tablets (650 mg total) by mouth every 6 (six) hours as needed for mild pain (or Fever >/= 101). Patient not taking: Reported on 08/31/2022 08/04/22   Tammy Grayer, [name]  albuterol (VENTOLIN HFA) 108 (90 Base) MCG/ACT inhaler INHALE 2 PUFFS INTO THE LUNGS EVERY 4 HOURS AS NEEDED FOR WHEEZING ORSHORTNESS OF BREATH 04/12/22   Tammy Banana., [name]  aspirin EC 81 MG tablet Take 1 tablet (81 mg total) by mouth daily. Swallow whole. Patient not taking: Reported on 08/31/2022 08/05/22   Tammy Grayer, [name]  atorvastatin (LIPITOR) 10 MG tablet Take 1 tablet (10 mg total) by mouth daily. 10/01/22   Tammy Speak, [name]  carvedilol (COREG) 6.25 MG tablet Take 1 tablet (6.25 mg total) by mouth 2 (two) times daily with [name] meal. Patient not taking: Reported on 08/31/2022 08/31/22   Tammy Graff, [name]  clopidogrel (PLAVIX) 75 MG tablet Take 1 tablet (75 mg total) by mouth daily. 08/24/22   Tammy Gip, [name]  cyanocobalamin 1000 MCG tablet Take 1 tablet (1,000 mcg total) by mouth daily. 06/18/22   Tammy Mandes, [name]  Dupilumab (DUPIXENT) 300 MG/2ML SOPN Inject 300 mg into the skin every 14 (fourteen) days. Starting at day 15 for maintenance. 09/30/22   Tammy Bathe, [name]  ezetimibe (ZETIA) 10 MG tablet TAKE 1 TABLET BY MOUTH DAILY 04/30/22   Tammy Banana., [name]  fexofenadine (ALLEGRA) 180 MG tablet Take 180 mg by mouth daily.    Provider, Historical, [name]  FLUoxetine (PROZAC) 10 MG capsule TAKE 1 CAPSULE BY MOUTH 3 TIMES DAILY. 04/30/22   Tammy Banana., [name]  furosemide (LASIX) 40 MG tablet Take 1 tablet (40 mg total) by mouth daily. 08/05/22   [name], Richard, [name]  glucose blood test strip ACCU-CHEK AVIVA PLUS TEST STRP Use to check sugars 3 times daily. 10/28/15   Provider, Historical, [name]  HUMALOG KWIKPEN 100 UNIT/ML KwikPen Inject 3 Units into the skin 3 (three) times daily. 08/04/22   Tammy Grayer, [name]  insulin glargine (LANTUS) 100 UNIT/ML injection Inject 0.06 mLs (6 Units total) into the  skin at bedtime. 08/04/22   Tammy Grayer, [name]  iron polysaccharides (NIFEREX) 150 MG capsule Take 1 capsule (150 mg total) by mouth daily. Patient not taking: Reported on 08/31/2022 08/31/22   Tammy Price [name], [name]  isosorbide mononitrate (IMDUR) 30 MG 24 hr tablet Take 1 tablet (30 mg total) by mouth daily. Take 30 mg by mouth daily. 08/24/22   Tammy Gip, [name]  lansoprazole (PREVACID) 30 MG capsule TAKE 1 CAPSULE BY MOUTH ONCE DAILY AT NOON Patient taking differently: Take 30 mg by mouth in the morning. 06/01/22   Tammy Banana., [name]  losartan (COZAAR) 100 MG tablet TAKE 1 TABLET BY MOUTH DAILY 06/01/22   Tammy Banana., [name]  memantine [facility]) 10 MG  tablet Take 10 mg by mouth 2 (two) times daily. 12/31/21   Provider, Historical, [name]  oxyCODONE (OXY IR/ROXICODONE) 5 MG immediate release tablet Take 1 tablet (5 mg total) by mouth every 6 (six) hours as needed for severe pain. 08/04/22   Tammy Grayer, [name]  polyethylene glycol (MIRALAX / GLYCOLAX) 17 g packet Take 17 g by mouth daily as needed for mild constipation. 08/04/22   Tammy Grayer, [name]  QUEtiapine (SEROQUEL) 50 MG tablet Take 50 mg by mouth at bedtime. 11/09/21   Provider, Historical, [name]  vitamin E 1000 UNIT capsule Take 1,000 Units by mouth daily.    Provider, Historical, [name]   Review of Systems  Constitutional:  Positive for fatigue. Negative for appetite change.  HENT:  Negative for congestion, postnasal drip and sore throat.   Eyes:  Positive for visual disturbance (limited vision).  Respiratory:  Positive for cough and shortness of breath (very little). Negative for chest tightness.   Cardiovascular:  Negative for chest pain, palpitations and leg swelling.  Gastrointestinal:  Negative for abdominal distention and abdominal pain.  Endocrine: Negative.   Genitourinary: Negative.   Musculoskeletal:  Negative for back pain and neck pain.  Skin: Negative.   Allergic/Immunologic: Negative.   Neurological:  Negative for  dizziness and light-headedness.  Hematological:  Negative for adenopathy. Does not bruise/bleed easily.  Psychiatric/Behavioral:  Negative for dysphoric mood and sleep disturbance (sleeping in recliner due to comfort). The patient is not nervous/anxious.    Vitals:   10/19/22 1007  BP: (!) 164/71  Pulse: 85  Resp: 16  SpO2: 94%  Weight: 133 lb 8 oz (60.6 kg)   Wt Readings from Last 3 Encounters:  10/19/22 133 lb 8 oz (60.6 kg)  08/31/22 131 lb 8 oz (59.6 kg)  08/31/22 132 lb (59.9 kg)   Lab Results  Component Value Date   CREATININE 1.33 (H) 08/04/2022   CREATININE 1.19 (H) 08/03/2022   CREATININE 1.32 (H) 08/02/2022   Physical Exam Vitals and nursing note reviewed. Exam conducted with [name] chaperone present (son).  Constitutional:      Appearance: Normal appearance.  HENT:     Head: Normocephalic and atraumatic.  Cardiovascular:     Rate and Rhythm: Normal rate and regular rhythm.  Pulmonary:     Effort: Pulmonary effort is normal. No respiratory distress.     Breath sounds: No wheezing or rales.  Abdominal:     General: There is no distension.     Palpations: Abdomen is soft.     Tenderness: There is no abdominal tenderness.  Musculoskeletal:        General: No tenderness.     Cervical back: Normal range of motion and neck supple.     Right lower leg: No edema.     Left lower leg: No edema.  Skin:    General: Skin is warm and dry.  Neurological:     General: No focal deficit present.     Mental Status: She is alert and oriented to person, place, and time.  Psychiatric:        Mood and Affect: Mood normal.        Behavior: Behavior normal.        Thought Content: Thought content normal.   Assessment & Plan:  1: Chronic heart failure with mildly reduced ejection fraction- - NYHA class II - euvolemic today - weighing daily with the talking scales; call for an overnight weight gain of > 2 pounds or [name] weekly weight gain  of > 5 pounds - weight up 1.8 pounds from  last visit here 6 weeks ago - not adding salt to her food and DIL doesn't cook with salt; does eat micrwaveable breakfast but she still doesn't go over '2000mg'$  - son says that they are reading food labels for sodium content - on GDMT losartan & carvedilol - will change her losartan to entresto 24/'26mg'$  BID when her current pill packs finish; son thinks she has ~ 2 weeks left; entresto RX sent to her phamarcy - BMP next visit - walking with her walker and currently has PT coming to the home - BNP 07/30/22 was 1821.3 - PharmD reconciled medications with the patient  2: HTN- - BP 164/71; changing losartan to entresto per above - saw PCP (Oswalt) 08/16/22 - BMP 08/04/22 reviewed and showed sodium 129, potassium 3.7, creatinine 1.33 and GFR 38  3: DM with CKD- - saw endocrinology (Solum) 05/18/22 - A1c 06/15/22 was 6.9%  4: Anemia- - saw hematology [name]) 08/31/22 - hemoglobin 08/04/22 was 11.3 - had iron infusion 07/06/22  5: PAF- - saw cardiology [name]) 08/15/20; f/u has to be rescheduled   Patient did not bring her medications nor [name] list. Each medication was verbally reviewed with the patient and she was encouraged to bring the bottles to every visit to confirm accuracy of list.  Return in 2 months, sooner if needed.

## 2022-10-19 ENCOUNTER — Other Ambulatory Visit (HOSPITAL_COMMUNITY): Payer: Self-pay

## 2022-10-19 ENCOUNTER — Ambulatory Visit: Payer: Medicare Other | Attending: Family | Admitting: Family

## 2022-10-19 ENCOUNTER — Encounter: Payer: Self-pay | Admitting: Pharmacy Technician

## 2022-10-19 ENCOUNTER — Encounter: Payer: Self-pay | Admitting: Family

## 2022-10-19 ENCOUNTER — Encounter: Payer: Self-pay | Admitting: Oncology

## 2022-10-19 VITALS — BP 164/71 | HR 85 | Resp 16 | Wt 133.5 lb

## 2022-10-19 DIAGNOSIS — D631 Anemia in chronic kidney disease: Secondary | ICD-10-CM | POA: Insufficient documentation

## 2022-10-19 DIAGNOSIS — E782 Mixed hyperlipidemia: Secondary | ICD-10-CM | POA: Diagnosis not present

## 2022-10-19 DIAGNOSIS — Z86718 Personal history of other venous thrombosis and embolism: Secondary | ICD-10-CM | POA: Diagnosis not present

## 2022-10-19 DIAGNOSIS — E1122 Type 2 diabetes mellitus with diabetic chronic kidney disease: Secondary | ICD-10-CM | POA: Insufficient documentation

## 2022-10-19 DIAGNOSIS — R5383 Other fatigue: Secondary | ICD-10-CM | POA: Insufficient documentation

## 2022-10-19 DIAGNOSIS — J441 Chronic obstructive pulmonary disease with (acute) exacerbation: Secondary | ICD-10-CM | POA: Insufficient documentation

## 2022-10-19 DIAGNOSIS — I2510 Atherosclerotic heart disease of native coronary artery without angina pectoris: Secondary | ICD-10-CM | POA: Diagnosis not present

## 2022-10-19 DIAGNOSIS — H538 Other visual disturbances: Secondary | ICD-10-CM | POA: Diagnosis not present

## 2022-10-19 DIAGNOSIS — I509 Heart failure, unspecified: Secondary | ICD-10-CM | POA: Diagnosis not present

## 2022-10-19 DIAGNOSIS — Z9181 History of falling: Secondary | ICD-10-CM | POA: Diagnosis not present

## 2022-10-19 DIAGNOSIS — J9691 Respiratory failure, unspecified with hypoxia: Secondary | ICD-10-CM | POA: Diagnosis not present

## 2022-10-19 DIAGNOSIS — I10 Essential (primary) hypertension: Secondary | ICD-10-CM

## 2022-10-19 DIAGNOSIS — N1831 Chronic kidney disease, stage 3a: Secondary | ICD-10-CM

## 2022-10-19 DIAGNOSIS — R0602 Shortness of breath: Secondary | ICD-10-CM | POA: Insufficient documentation

## 2022-10-19 DIAGNOSIS — K219 Gastro-esophageal reflux disease without esophagitis: Secondary | ICD-10-CM | POA: Diagnosis not present

## 2022-10-19 DIAGNOSIS — D509 Iron deficiency anemia, unspecified: Secondary | ICD-10-CM | POA: Diagnosis not present

## 2022-10-19 DIAGNOSIS — I5042 Chronic combined systolic (congestive) and diastolic (congestive) heart failure: Secondary | ICD-10-CM | POA: Diagnosis not present

## 2022-10-19 DIAGNOSIS — Z7982 Long term (current) use of aspirin: Secondary | ICD-10-CM | POA: Diagnosis not present

## 2022-10-19 DIAGNOSIS — J449 Chronic obstructive pulmonary disease, unspecified: Secondary | ICD-10-CM | POA: Diagnosis not present

## 2022-10-19 DIAGNOSIS — Z7902 Long term (current) use of antithrombotics/antiplatelets: Secondary | ICD-10-CM | POA: Diagnosis not present

## 2022-10-19 DIAGNOSIS — I130 Hypertensive heart and chronic kidney disease with heart failure and stage 1 through stage 4 chronic kidney disease, or unspecified chronic kidney disease: Secondary | ICD-10-CM | POA: Diagnosis not present

## 2022-10-19 DIAGNOSIS — N189 Chronic kidney disease, unspecified: Secondary | ICD-10-CM | POA: Diagnosis not present

## 2022-10-19 DIAGNOSIS — Z79899 Other long term (current) drug therapy: Secondary | ICD-10-CM | POA: Diagnosis not present

## 2022-10-19 DIAGNOSIS — E1151 Type 2 diabetes mellitus with diabetic peripheral angiopathy without gangrene: Secondary | ICD-10-CM | POA: Diagnosis not present

## 2022-10-19 DIAGNOSIS — Z794 Long term (current) use of insulin: Secondary | ICD-10-CM | POA: Diagnosis not present

## 2022-10-19 DIAGNOSIS — E785 Hyperlipidemia, unspecified: Secondary | ICD-10-CM | POA: Insufficient documentation

## 2022-10-19 DIAGNOSIS — I480 Paroxysmal atrial fibrillation: Secondary | ICD-10-CM | POA: Insufficient documentation

## 2022-10-19 DIAGNOSIS — I5022 Chronic systolic (congestive) heart failure: Secondary | ICD-10-CM

## 2022-10-19 DIAGNOSIS — D508 Other iron deficiency anemias: Secondary | ICD-10-CM | POA: Diagnosis not present

## 2022-10-19 DIAGNOSIS — F0283 Dementia in other diseases classified elsewhere, unspecified severity, with mood disturbance: Secondary | ICD-10-CM | POA: Diagnosis not present

## 2022-10-19 MED ORDER — SACUBITRIL-VALSARTAN 24-26 MG PO TABS: 1.0000 | ORAL_TABLET | Freq: Two times a day (BID) | ORAL | 3 refills | Status: DC

## 2022-10-19 NOTE — Patient Instructions (Addendum)
Continue weighing daily and call for an overnight weight gain of 3 pounds or more or a weekly weight gain of more than 5 pounds.   If you have voicemail, please make sure your mailbox is cleaned out so that we may leave a message and please make sure to listen to any voicemails.     We will be starting entresto as 1 tablet twice daily with your next pill pack. At that time, you will no longer take losartan. I'll check lab work at your  next appointment.

## 2022-10-19 NOTE — Progress Notes (Signed)
[facility] - PHARMACIST COUNSELING NOTE  Guideline-Directed Medical Therapy/Evidence Based Medicine  ACE/ARB/ARNI: Losartan 100 mg daily Beta Blocker: Carvedilol 6.25 mg twice daily Aldosterone Antagonist:  None Diuretic: Furosemide 40 mg daily SGLT2i:  None  Adherence Assessment  Do you ever forget to take your medication? '[]'$ Yes '[x]'$ No  Do you ever skip doses due to side effects? '[]'$ Yes '[x]'$ No  Do you have trouble affording your medicines? '[]'$ Yes '[x]'$ No  Are you ever unable to pick up your medication due to transportation difficulties? '[]'$ Yes '[x]'$ No  Do you ever stop taking your medications because you don't believe they are helping? '[]'$ Yes '[x]'$ No  Do you check your weight daily? '[]'$ Yes '[x]'$ No   Adherence strategy: Pill packs from [facility]  Barriers to obtaining medications: None  Vital signs: HR 85, BP 164/71, weight (pounds) 133 ECHO: Date 07/2022, EF 45-50%     Latest Ref Rng & Units 08/04/2022    5:34 AM 08/03/2022    3:39 AM 08/02/2022    4:06 AM  BMP  Glucose 70 - 99 mg/dL 150  195  140   BUN 8 - 23 mg/dL 45  39  28   Creatinine 0.44 - 1.00 mg/dL 1.33  1.19  1.32   Sodium 135 - 145 mmol/L 129  132  133   Potassium 3.5 - 5.1 mmol/L 3.7  4.0  3.0   Chloride 98 - 111 mmol/L 93  92  91   CO2 22 - 32 mmol/L '27  29  29   '$ Calcium 8.9 - 10.3 mg/dL 8.5  8.5  8.6     Past Medical History:  Diagnosis Date   Anemia    Angina pectoris (HCC)    Atrial fibrillation (HCC)    Basal cell carcinoma 09/30/2022   dorsum nose - EDC   Cervicalgia    CHF (congestive heart failure) (HCC)    Chronic back pain    Chronic kidney disease    Chronic obstructive pulmonary disease (COPD) (HCC)    COPD (chronic obstructive pulmonary disease) (HCC)    DDD (degenerative disc disease), lumbar    Diabetes mellitus without complication (HCC)    Dysphagia    Hyperlipemia    Hypertension    Squamous cell carcinoma of skin    left lateral  pretibial   Vulvovaginitis     ASSESSMENT 89 year old female with PMH HTN, Afib (not on AC), PVD, CAD, HLD, T2DM, Lewy Body dementia, OZH0Q, and GU complications who presents to the HF clinic for follow-up. Most recent ECHO in 07/2022 shows EF 45-50%. Regarding GDMT, patient takes losartan 100 mg daily and carvedilol 6.25 mg twice daily. Patient also takes furosemide 40 mg daily. Patient receives pill packs from [facility], and I corroborated her medication list here with the medication list from [facility] that they use to assemble her pill backs. Patient is here with son who assists with questions which she is unable to answer. Blood pressure is high today, at 165/83 consistent with prior reading on 08/31/2022.  Recent ED Visit (past 6 months): Date - 06/15/2022, CC - SOB Date - 06/23/2022, CC - SOB and urinary frequency Date - 07/30/2022, CC - SOB  PLAN CHF/HTN Recommend initiation of Entresto 24/'26mg'$  twice daily for HFmrEF and additional blood pressure control This will replace losartan once her current pill packs run out Continue carvedilol and furosemide  HLD/CAD/PVD Continue Plavix, bASA, atorvastatin, and ezetimibe  Paroxysmal Afib CHADSVASc 7 Patient not on anticoagulation (  however on DAPT) In 2017, Afib noted to be transient  Time spent: 15 minutes  Will M. Ouida Sills, PharmD PGY-1 Pharmacy Resident 10/19/2022 3:07 PM   Current Outpatient Medications:    acetaminophen (TYLENOL) 325 MG tablet, Take 2 tablets (650 mg total) by mouth every 6 (six) hours as needed for mild pain (or Fever >/= 101)., Disp: , Rfl:    albuterol (VENTOLIN HFA) 108 (90 Base) MCG/ACT inhaler, INHALE 2 PUFFS INTO THE LUNGS EVERY 4 HOURS AS NEEDED FOR WHEEZING ORSHORTNESS OF BREATH, Disp: 8.5 g, Rfl: 10   aspirin EC 81 MG tablet, Take 1 tablet (81 mg total) by mouth daily. Swallow whole. (Patient taking differently: Take 325 mg by mouth daily. Swallow whole.), Disp: 30 tablet, Rfl: 0    atorvastatin (LIPITOR) 10 MG tablet, Take 1 tablet (10 mg total) by mouth daily., Disp: 90 tablet, Rfl: 0   carvedilol (COREG) 6.25 MG tablet, Take 1 tablet (6.25 mg total) by mouth 2 (two) times daily with a meal. (Patient not taking: Reported on 08/31/2022), Disp: 60 tablet, Rfl: 3   clopidogrel (PLAVIX) 75 MG tablet, Take 1 tablet (75 mg total) by mouth daily., Disp: 30 tablet, Rfl: 3   cyanocobalamin 1000 MCG tablet, Take 1 tablet (1,000 mcg total) by mouth daily., Disp: 30 tablet, Rfl: 2   Dupilumab (DUPIXENT) 300 MG/2ML SOPN, Inject 300 mg into the skin every 14 (fourteen) days. Starting at day 15 for maintenance., Disp: 4 mL, Rfl: 5   ezetimibe (ZETIA) 10 MG tablet, TAKE 1 TABLET BY MOUTH DAILY, Disp: 90 tablet, Rfl: 1   fexofenadine (ALLEGRA) 180 MG tablet, Take 180 mg by mouth daily., Disp: , Rfl:    FLUoxetine (PROZAC) 10 MG capsule, TAKE 1 CAPSULE BY MOUTH 3 TIMES DAILY., Disp: 90 capsule, Rfl: 3   furosemide (LASIX) 40 MG tablet, Take 1 tablet (40 mg total) by mouth daily., Disp: 30 tablet, Rfl: 0   glucose blood test strip, ACCU-CHEK AVIVA PLUS TEST STRP Use to check sugars 3 times daily., Disp: , Rfl:    HUMALOG KWIKPEN 100 UNIT/ML KwikPen, Inject 3 Units into the skin 3 (three) times daily., Disp: 15 mL, Rfl: 11   insulin glargine (LANTUS) 100 UNIT/ML injection, Inject 0.06 mLs (6 Units total) into the skin at bedtime., Disp: 10 mL, Rfl: 11   iron polysaccharides (NIFEREX) 150 MG capsule, Take 1 capsule (150 mg total) by mouth daily. (Patient not taking: Reported on 08/31/2022), Disp: 30 capsule, Rfl: 3   isosorbide mononitrate (IMDUR) 30 MG 24 hr tablet, Take 1 tablet (30 mg total) by mouth daily. Take 30 mg by mouth daily., Disp: 30 tablet, Rfl: 0   lansoprazole (PREVACID) 30 MG capsule, TAKE 1 CAPSULE BY MOUTH ONCE DAILY AT NOON (Patient taking differently: Take 30 mg by mouth daily at 12 noon.), Disp: 90 capsule, Rfl: 1   losartan (COZAAR) 100 MG tablet, Take 100 mg by mouth every  evening., Disp: , Rfl:    memantine (NAMENDA) 10 MG tablet, Take 10 mg by mouth 2 (two) times daily., Disp: , Rfl:    oxyCODONE (OXY IR/ROXICODONE) 5 MG immediate release tablet, Take 1 tablet (5 mg total) by mouth every 6 (six) hours as needed for severe pain., Disp: 6 tablet, Rfl: 0   polyethylene glycol (MIRALAX / GLYCOLAX) 17 g packet, Take 17 g by mouth daily as needed for mild constipation., Disp: 30 each, Rfl: 0   QUEtiapine (SEROQUEL) 50 MG tablet, Take 50 mg by mouth at bedtime., Disp: ,  Rfl:    sacubitril-valsartan (ENTRESTO) 24-26 MG, Take 1 tablet by mouth 2 (two) times daily. (Patient not taking: Reported on 10/19/2022), Disp: 180 tablet, Rfl: 3   vitamin E 1000 UNIT capsule, Take 1,000 Units by mouth daily., Disp: , Rfl:    DRUGS TO CAUTION IN HEART FAILURE  Drug or Class Mechanism  Analgesics NSAIDs COX-2 inhibitors Glucocorticoids  Sodium and water retention, increased systemic vascular resistance, decreased response to diuretics   Diabetes Medications Metformin Thiazolidinediones Rosiglitazone (Avandia) Pioglitazone (Actos) DPP4 Inhibitors Saxagliptin (Onglyza) Sitagliptin (Januvia)   Lactic acidosis Possible calcium channel blockade   Unknown  Antiarrhythmics Class I  Flecainide Disopyramide Class III Sotalol Other Dronedarone  Negative inotrope, proarrhythmic   Proarrhythmic, beta blockade  Negative inotrope  Antihypertensives Alpha Blockers Doxazosin Calcium Channel Blockers Diltiazem Verapamil Nifedipine Central Alpha Adrenergics Moxonidine Peripheral Vasodilators Minoxidil  Increases renin and aldosterone  Negative inotrope    Possible sympathetic withdrawal  Unknown  Anti-infective Itraconazole Amphotericin B  Negative inotrope Unknown  Hematologic Anagrelide Cilostazol   Possible inhibition of PD IV Inhibition of PD III causing arrhythmias  Neurologic/Psychiatric Stimulants Anti-Seizure  Drugs Carbamazepine Pregabalin Antidepressants Tricyclics Citalopram Parkinsons Bromocriptine Pergolide Pramipexole Antipsychotics Clozapine Antimigraine Ergotamine Methysergide Appetite suppressants Bipolar Lithium  Peripheral alpha and beta agonist activity  Negative inotrope and chronotrope Calcium channel blockade  Negative inotrope, proarrhythmic Dose-dependent QT prolongation  Excessive serotonin activity/valvular damage Excessive serotonin activity/valvular damage Unknown  IgE mediated hypersensitivy, calcium channel blockade  Excessive serotonin activity/valvular damage Excessive serotonin activity/valvular damage Valvular damage  Direct myofibrillar degeneration, adrenergic stimulation  Antimalarials Chloroquine Hydroxychloroquine Intracellular inhibition of lysosomal enzymes  Urologic Agents Alpha Blockers Doxazosin Prazosin Tamsulosin Terazosin  Increased renin and aldosterone  Adapted from Page Carleene Overlie, et al. "Drugs That May Cause or Exacerbate Heart Failure: A Scientific Statement from the American Heart  Association." Circulation 2016; 134:e32-e69. DOI: 10.1161/CIR.0000000000000426   MEDICATION ADHERENCES TIPS AND STRATEGIES Taking medication as prescribed improves patient outcomes in heart failure (reduces hospitalizations, improves symptoms, increases survival) Side effects of medications can be managed by decreasing doses, switching agents, stopping drugs, or adding additional therapy. Please let someone in the [facility] know if you have having bothersome side effects so we can modify your regimen. Do not alter your medication regimen without talking to [place].  Medication reminders can help patients remember to take drugs on time. If you are missing or forgetting doses you can try linking behaviors, using pill boxes, or an electronic reminder like an alarm on your phone or an app. Some people can also get automated phone calls as medication  reminders.

## 2022-10-21 ENCOUNTER — Telehealth: Payer: Self-pay | Admitting: Physician Assistant

## 2022-10-21 NOTE — Telephone Encounter (Signed)
Home Health Verbal Orders - Caller/Agency: [facility] Number: [phone] vm can be left  Requesting OT Frequency:1 x a week for 8 weeks

## 2022-10-22 NOTE — Telephone Encounter (Signed)
Notified [facility] regarding verbal order ok by ostwalt, PA

## 2022-10-22 NOTE — Telephone Encounter (Signed)
Please advise 

## 2022-10-27 ENCOUNTER — Encounter: Payer: Self-pay | Admitting: Family Medicine

## 2022-10-27 DIAGNOSIS — J9691 Respiratory failure, unspecified with hypoxia: Secondary | ICD-10-CM | POA: Diagnosis not present

## 2022-10-27 DIAGNOSIS — E1122 Type 2 diabetes mellitus with diabetic chronic kidney disease: Secondary | ICD-10-CM | POA: Diagnosis not present

## 2022-10-27 DIAGNOSIS — Z794 Long term (current) use of insulin: Secondary | ICD-10-CM | POA: Diagnosis not present

## 2022-10-27 DIAGNOSIS — Z7902 Long term (current) use of antithrombotics/antiplatelets: Secondary | ICD-10-CM | POA: Diagnosis not present

## 2022-10-27 DIAGNOSIS — Z7982 Long term (current) use of aspirin: Secondary | ICD-10-CM | POA: Diagnosis not present

## 2022-10-27 DIAGNOSIS — K219 Gastro-esophageal reflux disease without esophagitis: Secondary | ICD-10-CM | POA: Diagnosis not present

## 2022-10-27 DIAGNOSIS — F0283 Dementia in other diseases classified elsewhere, unspecified severity, with mood disturbance: Secondary | ICD-10-CM | POA: Diagnosis not present

## 2022-10-27 DIAGNOSIS — N1831 Chronic kidney disease, stage 3a: Secondary | ICD-10-CM | POA: Diagnosis not present

## 2022-10-27 DIAGNOSIS — I130 Hypertensive heart and chronic kidney disease with heart failure and stage 1 through stage 4 chronic kidney disease, or unspecified chronic kidney disease: Secondary | ICD-10-CM | POA: Diagnosis not present

## 2022-10-27 DIAGNOSIS — I5042 Chronic combined systolic (congestive) and diastolic (congestive) heart failure: Secondary | ICD-10-CM | POA: Diagnosis not present

## 2022-10-27 DIAGNOSIS — J449 Chronic obstructive pulmonary disease, unspecified: Secondary | ICD-10-CM | POA: Diagnosis not present

## 2022-10-27 DIAGNOSIS — E782 Mixed hyperlipidemia: Secondary | ICD-10-CM | POA: Diagnosis not present

## 2022-10-27 DIAGNOSIS — Z9181 History of falling: Secondary | ICD-10-CM | POA: Diagnosis not present

## 2022-10-27 DIAGNOSIS — D631 Anemia in chronic kidney disease: Secondary | ICD-10-CM | POA: Diagnosis not present

## 2022-10-27 DIAGNOSIS — I2510 Atherosclerotic heart disease of native coronary artery without angina pectoris: Secondary | ICD-10-CM | POA: Diagnosis not present

## 2022-10-27 DIAGNOSIS — D508 Other iron deficiency anemias: Secondary | ICD-10-CM | POA: Diagnosis not present

## 2022-10-27 DIAGNOSIS — E1151 Type 2 diabetes mellitus with diabetic peripheral angiopathy without gangrene: Secondary | ICD-10-CM | POA: Diagnosis not present

## 2022-10-27 DIAGNOSIS — Z86718 Personal history of other venous thrombosis and embolism: Secondary | ICD-10-CM | POA: Diagnosis not present

## 2022-10-27 NOTE — Progress Notes (Signed)
Reviewed and agree with patient's detailed plan of care. Home Health Certification and Plan of Care from 10/22/2022 through 12/20/2022 signed and sent to medical records to be faxed to home health agency.

## 2022-11-03 DIAGNOSIS — J449 Chronic obstructive pulmonary disease, unspecified: Secondary | ICD-10-CM | POA: Diagnosis not present

## 2022-11-03 DIAGNOSIS — Z7902 Long term (current) use of antithrombotics/antiplatelets: Secondary | ICD-10-CM | POA: Diagnosis not present

## 2022-11-03 DIAGNOSIS — Z794 Long term (current) use of insulin: Secondary | ICD-10-CM | POA: Diagnosis not present

## 2022-11-03 DIAGNOSIS — F0283 Dementia in other diseases classified elsewhere, unspecified severity, with mood disturbance: Secondary | ICD-10-CM | POA: Diagnosis not present

## 2022-11-03 DIAGNOSIS — D631 Anemia in chronic kidney disease: Secondary | ICD-10-CM | POA: Diagnosis not present

## 2022-11-03 DIAGNOSIS — E1122 Type 2 diabetes mellitus with diabetic chronic kidney disease: Secondary | ICD-10-CM | POA: Diagnosis not present

## 2022-11-03 DIAGNOSIS — Z9181 History of falling: Secondary | ICD-10-CM | POA: Diagnosis not present

## 2022-11-03 DIAGNOSIS — D508 Other iron deficiency anemias: Secondary | ICD-10-CM | POA: Diagnosis not present

## 2022-11-03 DIAGNOSIS — Z7982 Long term (current) use of aspirin: Secondary | ICD-10-CM | POA: Diagnosis not present

## 2022-11-03 DIAGNOSIS — E782 Mixed hyperlipidemia: Secondary | ICD-10-CM | POA: Diagnosis not present

## 2022-11-03 DIAGNOSIS — N1831 Chronic kidney disease, stage 3a: Secondary | ICD-10-CM | POA: Diagnosis not present

## 2022-11-03 DIAGNOSIS — J9691 Respiratory failure, unspecified with hypoxia: Secondary | ICD-10-CM | POA: Diagnosis not present

## 2022-11-03 DIAGNOSIS — E1151 Type 2 diabetes mellitus with diabetic peripheral angiopathy without gangrene: Secondary | ICD-10-CM | POA: Diagnosis not present

## 2022-11-03 DIAGNOSIS — Z86718 Personal history of other venous thrombosis and embolism: Secondary | ICD-10-CM | POA: Diagnosis not present

## 2022-11-03 DIAGNOSIS — I130 Hypertensive heart and chronic kidney disease with heart failure and stage 1 through stage 4 chronic kidney disease, or unspecified chronic kidney disease: Secondary | ICD-10-CM | POA: Diagnosis not present

## 2022-11-03 DIAGNOSIS — K219 Gastro-esophageal reflux disease without esophagitis: Secondary | ICD-10-CM | POA: Diagnosis not present

## 2022-11-03 DIAGNOSIS — I2510 Atherosclerotic heart disease of native coronary artery without angina pectoris: Secondary | ICD-10-CM | POA: Diagnosis not present

## 2022-11-03 DIAGNOSIS — I5042 Chronic combined systolic (congestive) and diastolic (congestive) heart failure: Secondary | ICD-10-CM | POA: Diagnosis not present

## 2022-11-08 ENCOUNTER — Encounter: Payer: Self-pay | Admitting: *Deleted

## 2022-11-08 ENCOUNTER — Telehealth: Payer: Self-pay | Admitting: *Deleted

## 2022-11-08 NOTE — Patient Outreach (Signed)
  Care Coordination   Initial Visit Note   11/08/2022 Name: [name] MRN: [id number] DOB: May 13, 1929  [name] is a 89 y.o. year old female who sees [place], [place], [place] for primary care. I spoke with  [name], daughter of [name] by phone today.  What matters to the patients health and wellness today?  Per daughter, continuing to manage diabetes and other chronic conditions.  Patient lives alone but daughter state she is doing very well.  Adult children help in her care, son provides transportation to MD appointments.     Goals Addressed             This Visit's Progress    Effective management of DM       Care Coordination Interventions: Provided education to patient about basic DM disease process Reviewed medications with patient and discussed importance of medication adherence Discussed plans with patient for ongoing care management follow up and provided patient with direct contact information for care management team Reviewed scheduled/upcoming provider appointments including: endocrinology tomorrow, HF clinic on 3/26, and AWV on 8/26.  Advised of need to schedule regular follow up with PCP, last visit on 11/20 Advised patient, providing education and rationale, to check cbg at least daily and record, calling provider for findings outside established parameters Screening for signs and symptoms of depression related to chronic disease state  Assessed social determinant of health barriers Discussed with daughter glucose monitoring, patient's children are able to monitor readings, sending her alerts when she is less then 70 and in need of food/snack. Discussed decrease in A1C from 9.4 to 8 in August, will have new reading tomorrow.  Goal less than 7        SDOH assessments and interventions completed:  Yes  SDOH Interventions Today    Flowsheet Row Most Recent Value  SDOH Interventions   Food Insecurity Interventions Intervention Not  Indicated  Housing Interventions Intervention Not Indicated  Transportation Interventions Intervention Not Indicated        Care Coordination Interventions:  Yes, provided   Follow up plan: Follow up call scheduled for 3/25    Encounter Outcome:  Pt. Visit Completed   Valente David, RN, MSN, [facility] Care Management Coordinator [phone]

## 2022-11-08 NOTE — Patient Instructions (Signed)
Visit Information  Thank you for taking time to visit with me today. Please don't hesitate to contact me if I can be of assistance to you before our next scheduled telephone appointment.  Following are the goals we discussed today:  Continue monitoring blood sugars daily.  Follow diabetic diet (low sugars and low carbohydrates).  Our next appointment is by telephone on 3/25  Please call the care guide team at [phone] if you need to cancel or reschedule your appointment.   Please call the Suicide and Crisis Lifeline: 988 call the [place] National Suicide Prevention Lifeline: 718-708-0146 or TTY: 386-433-8937 TTY (365) 234-9891) to talk to a trained counselor call 1-800-273-TALK (toll free, 24 hour hotline) call 911 if you are experiencing a Mental Health or [place] or need someone to talk to.  Patient verbalizes understanding of instructions and care plan provided today and agrees to view in [place]. Active MyChart status and patient understanding of how to access instructions and care plan via MyChart confirmed with patient.     The patient has been provided with contact information for the care management team and has been advised to call with any health related questions or concerns.   Valente David, RN, MSN, [place] Care Management Care Management Coordinator [phone]

## 2022-11-09 DIAGNOSIS — E1129 Type 2 diabetes mellitus with other diabetic kidney complication: Secondary | ICD-10-CM | POA: Diagnosis not present

## 2022-11-09 DIAGNOSIS — N1832 Chronic kidney disease, stage 3b: Secondary | ICD-10-CM | POA: Diagnosis not present

## 2022-11-09 DIAGNOSIS — R809 Proteinuria, unspecified: Secondary | ICD-10-CM | POA: Diagnosis not present

## 2022-11-09 DIAGNOSIS — E1122 Type 2 diabetes mellitus with diabetic chronic kidney disease: Secondary | ICD-10-CM | POA: Diagnosis not present

## 2022-11-09 DIAGNOSIS — E1159 Type 2 diabetes mellitus with other circulatory complications: Secondary | ICD-10-CM | POA: Diagnosis not present

## 2022-11-09 DIAGNOSIS — I10 Essential (primary) hypertension: Secondary | ICD-10-CM | POA: Diagnosis not present

## 2022-11-09 DIAGNOSIS — Z794 Long term (current) use of insulin: Secondary | ICD-10-CM | POA: Diagnosis not present

## 2022-11-10 DIAGNOSIS — E782 Mixed hyperlipidemia: Secondary | ICD-10-CM | POA: Diagnosis not present

## 2022-11-10 DIAGNOSIS — D631 Anemia in chronic kidney disease: Secondary | ICD-10-CM | POA: Diagnosis not present

## 2022-11-10 DIAGNOSIS — Z7982 Long term (current) use of aspirin: Secondary | ICD-10-CM | POA: Diagnosis not present

## 2022-11-10 DIAGNOSIS — N1831 Chronic kidney disease, stage 3a: Secondary | ICD-10-CM | POA: Diagnosis not present

## 2022-11-10 DIAGNOSIS — Z86718 Personal history of other venous thrombosis and embolism: Secondary | ICD-10-CM | POA: Diagnosis not present

## 2022-11-10 DIAGNOSIS — Z794 Long term (current) use of insulin: Secondary | ICD-10-CM | POA: Diagnosis not present

## 2022-11-10 DIAGNOSIS — J9691 Respiratory failure, unspecified with hypoxia: Secondary | ICD-10-CM | POA: Diagnosis not present

## 2022-11-10 DIAGNOSIS — D508 Other iron deficiency anemias: Secondary | ICD-10-CM | POA: Diagnosis not present

## 2022-11-10 DIAGNOSIS — Z7902 Long term (current) use of antithrombotics/antiplatelets: Secondary | ICD-10-CM | POA: Diagnosis not present

## 2022-11-10 DIAGNOSIS — F0283 Dementia in other diseases classified elsewhere, unspecified severity, with mood disturbance: Secondary | ICD-10-CM | POA: Diagnosis not present

## 2022-11-10 DIAGNOSIS — I130 Hypertensive heart and chronic kidney disease with heart failure and stage 1 through stage 4 chronic kidney disease, or unspecified chronic kidney disease: Secondary | ICD-10-CM | POA: Diagnosis not present

## 2022-11-10 DIAGNOSIS — E1151 Type 2 diabetes mellitus with diabetic peripheral angiopathy without gangrene: Secondary | ICD-10-CM | POA: Diagnosis not present

## 2022-11-10 DIAGNOSIS — I5042 Chronic combined systolic (congestive) and diastolic (congestive) heart failure: Secondary | ICD-10-CM | POA: Diagnosis not present

## 2022-11-10 DIAGNOSIS — Z9181 History of falling: Secondary | ICD-10-CM | POA: Diagnosis not present

## 2022-11-10 DIAGNOSIS — K219 Gastro-esophageal reflux disease without esophagitis: Secondary | ICD-10-CM | POA: Diagnosis not present

## 2022-11-10 DIAGNOSIS — J449 Chronic obstructive pulmonary disease, unspecified: Secondary | ICD-10-CM | POA: Diagnosis not present

## 2022-11-10 DIAGNOSIS — I2510 Atherosclerotic heart disease of native coronary artery without angina pectoris: Secondary | ICD-10-CM | POA: Diagnosis not present

## 2022-11-10 DIAGNOSIS — E1122 Type 2 diabetes mellitus with diabetic chronic kidney disease: Secondary | ICD-10-CM | POA: Diagnosis not present

## 2022-11-16 DIAGNOSIS — Z794 Long term (current) use of insulin: Secondary | ICD-10-CM | POA: Diagnosis not present

## 2022-11-16 DIAGNOSIS — E1165 Type 2 diabetes mellitus with hyperglycemia: Secondary | ICD-10-CM | POA: Diagnosis not present

## 2022-11-17 DIAGNOSIS — I130 Hypertensive heart and chronic kidney disease with heart failure and stage 1 through stage 4 chronic kidney disease, or unspecified chronic kidney disease: Secondary | ICD-10-CM | POA: Diagnosis not present

## 2022-11-17 DIAGNOSIS — J9691 Respiratory failure, unspecified with hypoxia: Secondary | ICD-10-CM | POA: Diagnosis not present

## 2022-11-17 DIAGNOSIS — Z7902 Long term (current) use of antithrombotics/antiplatelets: Secondary | ICD-10-CM | POA: Diagnosis not present

## 2022-11-17 DIAGNOSIS — Z7982 Long term (current) use of aspirin: Secondary | ICD-10-CM | POA: Diagnosis not present

## 2022-11-17 DIAGNOSIS — I5042 Chronic combined systolic (congestive) and diastolic (congestive) heart failure: Secondary | ICD-10-CM | POA: Diagnosis not present

## 2022-11-17 DIAGNOSIS — K219 Gastro-esophageal reflux disease without esophagitis: Secondary | ICD-10-CM | POA: Diagnosis not present

## 2022-11-17 DIAGNOSIS — Z86718 Personal history of other venous thrombosis and embolism: Secondary | ICD-10-CM | POA: Diagnosis not present

## 2022-11-17 DIAGNOSIS — Z9181 History of falling: Secondary | ICD-10-CM | POA: Diagnosis not present

## 2022-11-17 DIAGNOSIS — E1122 Type 2 diabetes mellitus with diabetic chronic kidney disease: Secondary | ICD-10-CM | POA: Diagnosis not present

## 2022-11-17 DIAGNOSIS — D508 Other iron deficiency anemias: Secondary | ICD-10-CM | POA: Diagnosis not present

## 2022-11-17 DIAGNOSIS — E782 Mixed hyperlipidemia: Secondary | ICD-10-CM | POA: Diagnosis not present

## 2022-11-17 DIAGNOSIS — Z794 Long term (current) use of insulin: Secondary | ICD-10-CM | POA: Diagnosis not present

## 2022-11-17 DIAGNOSIS — E1151 Type 2 diabetes mellitus with diabetic peripheral angiopathy without gangrene: Secondary | ICD-10-CM | POA: Diagnosis not present

## 2022-11-17 DIAGNOSIS — I2510 Atherosclerotic heart disease of native coronary artery without angina pectoris: Secondary | ICD-10-CM | POA: Diagnosis not present

## 2022-11-17 DIAGNOSIS — D631 Anemia in chronic kidney disease: Secondary | ICD-10-CM | POA: Diagnosis not present

## 2022-11-17 DIAGNOSIS — N1831 Chronic kidney disease, stage 3a: Secondary | ICD-10-CM | POA: Diagnosis not present

## 2022-11-17 DIAGNOSIS — F0283 Dementia in other diseases classified elsewhere, unspecified severity, with mood disturbance: Secondary | ICD-10-CM | POA: Diagnosis not present

## 2022-11-17 DIAGNOSIS — J449 Chronic obstructive pulmonary disease, unspecified: Secondary | ICD-10-CM | POA: Diagnosis not present

## 2022-11-23 DIAGNOSIS — J449 Chronic obstructive pulmonary disease, unspecified: Secondary | ICD-10-CM | POA: Diagnosis not present

## 2022-11-23 DIAGNOSIS — E1151 Type 2 diabetes mellitus with diabetic peripheral angiopathy without gangrene: Secondary | ICD-10-CM | POA: Diagnosis not present

## 2022-11-23 DIAGNOSIS — D508 Other iron deficiency anemias: Secondary | ICD-10-CM | POA: Diagnosis not present

## 2022-11-23 DIAGNOSIS — I2510 Atherosclerotic heart disease of native coronary artery without angina pectoris: Secondary | ICD-10-CM | POA: Diagnosis not present

## 2022-11-23 DIAGNOSIS — N1831 Chronic kidney disease, stage 3a: Secondary | ICD-10-CM | POA: Diagnosis not present

## 2022-11-23 DIAGNOSIS — K219 Gastro-esophageal reflux disease without esophagitis: Secondary | ICD-10-CM | POA: Diagnosis not present

## 2022-11-23 DIAGNOSIS — E1122 Type 2 diabetes mellitus with diabetic chronic kidney disease: Secondary | ICD-10-CM | POA: Diagnosis not present

## 2022-11-23 DIAGNOSIS — I5042 Chronic combined systolic (congestive) and diastolic (congestive) heart failure: Secondary | ICD-10-CM | POA: Diagnosis not present

## 2022-11-23 DIAGNOSIS — Z7982 Long term (current) use of aspirin: Secondary | ICD-10-CM | POA: Diagnosis not present

## 2022-11-23 DIAGNOSIS — I130 Hypertensive heart and chronic kidney disease with heart failure and stage 1 through stage 4 chronic kidney disease, or unspecified chronic kidney disease: Secondary | ICD-10-CM | POA: Diagnosis not present

## 2022-11-23 DIAGNOSIS — Z86718 Personal history of other venous thrombosis and embolism: Secondary | ICD-10-CM | POA: Diagnosis not present

## 2022-11-23 DIAGNOSIS — J9691 Respiratory failure, unspecified with hypoxia: Secondary | ICD-10-CM | POA: Diagnosis not present

## 2022-11-23 DIAGNOSIS — Z9181 History of falling: Secondary | ICD-10-CM | POA: Diagnosis not present

## 2022-11-23 DIAGNOSIS — Z794 Long term (current) use of insulin: Secondary | ICD-10-CM | POA: Diagnosis not present

## 2022-11-23 DIAGNOSIS — F0283 Dementia in other diseases classified elsewhere, unspecified severity, with mood disturbance: Secondary | ICD-10-CM | POA: Diagnosis not present

## 2022-11-23 DIAGNOSIS — E782 Mixed hyperlipidemia: Secondary | ICD-10-CM | POA: Diagnosis not present

## 2022-11-23 DIAGNOSIS — D631 Anemia in chronic kidney disease: Secondary | ICD-10-CM | POA: Diagnosis not present

## 2022-11-23 DIAGNOSIS — Z7902 Long term (current) use of antithrombotics/antiplatelets: Secondary | ICD-10-CM | POA: Diagnosis not present

## 2022-11-29 DIAGNOSIS — E1151 Type 2 diabetes mellitus with diabetic peripheral angiopathy without gangrene: Secondary | ICD-10-CM | POA: Diagnosis not present

## 2022-11-29 DIAGNOSIS — Z7982 Long term (current) use of aspirin: Secondary | ICD-10-CM | POA: Diagnosis not present

## 2022-11-29 DIAGNOSIS — J9691 Respiratory failure, unspecified with hypoxia: Secondary | ICD-10-CM | POA: Diagnosis not present

## 2022-11-29 DIAGNOSIS — D508 Other iron deficiency anemias: Secondary | ICD-10-CM | POA: Diagnosis not present

## 2022-11-29 DIAGNOSIS — K219 Gastro-esophageal reflux disease without esophagitis: Secondary | ICD-10-CM | POA: Diagnosis not present

## 2022-11-29 DIAGNOSIS — F0283 Dementia in other diseases classified elsewhere, unspecified severity, with mood disturbance: Secondary | ICD-10-CM | POA: Diagnosis not present

## 2022-11-29 DIAGNOSIS — I5042 Chronic combined systolic (congestive) and diastolic (congestive) heart failure: Secondary | ICD-10-CM | POA: Diagnosis not present

## 2022-11-29 DIAGNOSIS — Z794 Long term (current) use of insulin: Secondary | ICD-10-CM | POA: Diagnosis not present

## 2022-11-29 DIAGNOSIS — E782 Mixed hyperlipidemia: Secondary | ICD-10-CM | POA: Diagnosis not present

## 2022-11-29 DIAGNOSIS — Z86718 Personal history of other venous thrombosis and embolism: Secondary | ICD-10-CM | POA: Diagnosis not present

## 2022-11-29 DIAGNOSIS — I130 Hypertensive heart and chronic kidney disease with heart failure and stage 1 through stage 4 chronic kidney disease, or unspecified chronic kidney disease: Secondary | ICD-10-CM | POA: Diagnosis not present

## 2022-11-29 DIAGNOSIS — Z9181 History of falling: Secondary | ICD-10-CM | POA: Diagnosis not present

## 2022-11-29 DIAGNOSIS — Z7902 Long term (current) use of antithrombotics/antiplatelets: Secondary | ICD-10-CM | POA: Diagnosis not present

## 2022-11-29 DIAGNOSIS — I2510 Atherosclerotic heart disease of native coronary artery without angina pectoris: Secondary | ICD-10-CM | POA: Diagnosis not present

## 2022-11-29 DIAGNOSIS — J449 Chronic obstructive pulmonary disease, unspecified: Secondary | ICD-10-CM | POA: Diagnosis not present

## 2022-11-29 DIAGNOSIS — D631 Anemia in chronic kidney disease: Secondary | ICD-10-CM | POA: Diagnosis not present

## 2022-11-29 DIAGNOSIS — E1122 Type 2 diabetes mellitus with diabetic chronic kidney disease: Secondary | ICD-10-CM | POA: Diagnosis not present

## 2022-11-29 DIAGNOSIS — N1831 Chronic kidney disease, stage 3a: Secondary | ICD-10-CM | POA: Diagnosis not present

## 2022-11-30 ENCOUNTER — Inpatient Hospital Stay: Payer: Medicare Other | Attending: Oncology

## 2022-11-30 DIAGNOSIS — D509 Iron deficiency anemia, unspecified: Secondary | ICD-10-CM | POA: Insufficient documentation

## 2022-11-30 DIAGNOSIS — E538 Deficiency of other specified B group vitamins: Secondary | ICD-10-CM

## 2022-11-30 LAB — IRON AND TIBC
Iron: 92 ug/dL (ref 28–170)
Saturation Ratios: 38 % — ABNORMAL HIGH (ref 10.4–31.8)
TIBC: 239 ug/dL — ABNORMAL LOW (ref 250–450)
UIBC: 147 ug/dL

## 2022-11-30 LAB — FERRITIN: Ferritin: 176 ng/mL (ref 11–307)

## 2022-11-30 LAB — CBC WITH DIFFERENTIAL/PLATELET
Abs Immature Granulocytes: 0.03 10*3/uL (ref 0.00–0.07)
Basophils Absolute: 0.1 10*3/uL (ref 0.0–0.1)
Basophils Relative: 1 %
Eosinophils Absolute: 0.8 10*3/uL — ABNORMAL HIGH (ref 0.0–0.5)
Eosinophils Relative: 9 %
HCT: 42.4 % (ref 36.0–46.0)
Hemoglobin: 14 g/dL (ref 12.0–15.0)
Immature Granulocytes: 0 %
Lymphocytes Relative: 23 %
Lymphs Abs: 2.3 10*3/uL (ref 0.7–4.0)
MCH: 30.4 pg (ref 26.0–34.0)
MCHC: 33 g/dL (ref 30.0–36.0)
MCV: 92.2 fL (ref 80.0–100.0)
Monocytes Absolute: 0.9 10*3/uL (ref 0.1–1.0)
Monocytes Relative: 9 %
Neutro Abs: 5.7 10*3/uL (ref 1.7–7.7)
Neutrophils Relative %: 58 %
Platelets: 258 10*3/uL (ref 150–400)
RBC: 4.6 MIL/uL (ref 3.87–5.11)
RDW: 13.2 % (ref 11.5–15.5)
WBC: 9.7 10*3/uL (ref 4.0–10.5)
nRBC: 0 % (ref 0.0–0.2)

## 2022-11-30 LAB — VITAMIN B12: Vitamin B-12: 425 pg/mL (ref 180–914)

## 2022-12-09 DIAGNOSIS — Z7902 Long term (current) use of antithrombotics/antiplatelets: Secondary | ICD-10-CM | POA: Diagnosis not present

## 2022-12-09 DIAGNOSIS — Z9181 History of falling: Secondary | ICD-10-CM | POA: Diagnosis not present

## 2022-12-09 DIAGNOSIS — Z86718 Personal history of other venous thrombosis and embolism: Secondary | ICD-10-CM | POA: Diagnosis not present

## 2022-12-09 DIAGNOSIS — J449 Chronic obstructive pulmonary disease, unspecified: Secondary | ICD-10-CM | POA: Diagnosis not present

## 2022-12-09 DIAGNOSIS — K219 Gastro-esophageal reflux disease without esophagitis: Secondary | ICD-10-CM | POA: Diagnosis not present

## 2022-12-09 DIAGNOSIS — E1151 Type 2 diabetes mellitus with diabetic peripheral angiopathy without gangrene: Secondary | ICD-10-CM | POA: Diagnosis not present

## 2022-12-09 DIAGNOSIS — E782 Mixed hyperlipidemia: Secondary | ICD-10-CM | POA: Diagnosis not present

## 2022-12-09 DIAGNOSIS — D508 Other iron deficiency anemias: Secondary | ICD-10-CM | POA: Diagnosis not present

## 2022-12-09 DIAGNOSIS — I2510 Atherosclerotic heart disease of native coronary artery without angina pectoris: Secondary | ICD-10-CM | POA: Diagnosis not present

## 2022-12-09 DIAGNOSIS — I130 Hypertensive heart and chronic kidney disease with heart failure and stage 1 through stage 4 chronic kidney disease, or unspecified chronic kidney disease: Secondary | ICD-10-CM | POA: Diagnosis not present

## 2022-12-09 DIAGNOSIS — D631 Anemia in chronic kidney disease: Secondary | ICD-10-CM | POA: Diagnosis not present

## 2022-12-09 DIAGNOSIS — E1122 Type 2 diabetes mellitus with diabetic chronic kidney disease: Secondary | ICD-10-CM | POA: Diagnosis not present

## 2022-12-09 DIAGNOSIS — J9691 Respiratory failure, unspecified with hypoxia: Secondary | ICD-10-CM | POA: Diagnosis not present

## 2022-12-09 DIAGNOSIS — Z7982 Long term (current) use of aspirin: Secondary | ICD-10-CM | POA: Diagnosis not present

## 2022-12-09 DIAGNOSIS — Z794 Long term (current) use of insulin: Secondary | ICD-10-CM | POA: Diagnosis not present

## 2022-12-09 DIAGNOSIS — F0283 Dementia in other diseases classified elsewhere, unspecified severity, with mood disturbance: Secondary | ICD-10-CM | POA: Diagnosis not present

## 2022-12-09 DIAGNOSIS — I5042 Chronic combined systolic (congestive) and diastolic (congestive) heart failure: Secondary | ICD-10-CM | POA: Diagnosis not present

## 2022-12-09 DIAGNOSIS — N1831 Chronic kidney disease, stage 3a: Secondary | ICD-10-CM | POA: Diagnosis not present

## 2022-12-17 DIAGNOSIS — E1122 Type 2 diabetes mellitus with diabetic chronic kidney disease: Secondary | ICD-10-CM | POA: Diagnosis not present

## 2022-12-17 DIAGNOSIS — Z794 Long term (current) use of insulin: Secondary | ICD-10-CM | POA: Diagnosis not present

## 2022-12-17 DIAGNOSIS — Z7902 Long term (current) use of antithrombotics/antiplatelets: Secondary | ICD-10-CM | POA: Diagnosis not present

## 2022-12-17 DIAGNOSIS — I5042 Chronic combined systolic (congestive) and diastolic (congestive) heart failure: Secondary | ICD-10-CM | POA: Diagnosis not present

## 2022-12-17 DIAGNOSIS — Z86718 Personal history of other venous thrombosis and embolism: Secondary | ICD-10-CM | POA: Diagnosis not present

## 2022-12-17 DIAGNOSIS — Z7982 Long term (current) use of aspirin: Secondary | ICD-10-CM | POA: Diagnosis not present

## 2022-12-17 DIAGNOSIS — F0283 Dementia in other diseases classified elsewhere, unspecified severity, with mood disturbance: Secondary | ICD-10-CM | POA: Diagnosis not present

## 2022-12-17 DIAGNOSIS — D631 Anemia in chronic kidney disease: Secondary | ICD-10-CM | POA: Diagnosis not present

## 2022-12-17 DIAGNOSIS — K219 Gastro-esophageal reflux disease without esophagitis: Secondary | ICD-10-CM | POA: Diagnosis not present

## 2022-12-17 DIAGNOSIS — N1831 Chronic kidney disease, stage 3a: Secondary | ICD-10-CM | POA: Diagnosis not present

## 2022-12-17 DIAGNOSIS — D508 Other iron deficiency anemias: Secondary | ICD-10-CM | POA: Diagnosis not present

## 2022-12-17 DIAGNOSIS — E782 Mixed hyperlipidemia: Secondary | ICD-10-CM | POA: Diagnosis not present

## 2022-12-17 DIAGNOSIS — I2510 Atherosclerotic heart disease of native coronary artery without angina pectoris: Secondary | ICD-10-CM | POA: Diagnosis not present

## 2022-12-17 DIAGNOSIS — I130 Hypertensive heart and chronic kidney disease with heart failure and stage 1 through stage 4 chronic kidney disease, or unspecified chronic kidney disease: Secondary | ICD-10-CM | POA: Diagnosis not present

## 2022-12-17 DIAGNOSIS — E1151 Type 2 diabetes mellitus with diabetic peripheral angiopathy without gangrene: Secondary | ICD-10-CM | POA: Diagnosis not present

## 2022-12-17 DIAGNOSIS — J9691 Respiratory failure, unspecified with hypoxia: Secondary | ICD-10-CM | POA: Diagnosis not present

## 2022-12-17 DIAGNOSIS — J449 Chronic obstructive pulmonary disease, unspecified: Secondary | ICD-10-CM | POA: Diagnosis not present

## 2022-12-17 DIAGNOSIS — Z9181 History of falling: Secondary | ICD-10-CM | POA: Diagnosis not present

## 2022-12-20 ENCOUNTER — Encounter: Payer: Medicare Other | Admitting: *Deleted

## 2022-12-21 ENCOUNTER — Other Ambulatory Visit
Admission: RE | Admit: 2022-12-21 | Discharge: 2022-12-21 | Disposition: A | Discharge status: Home / Self Care | Payer: Medicare Other | Source: Ambulatory Visit | Attending: Family | Admitting: Family

## 2022-12-21 ENCOUNTER — Encounter: Payer: Medicare Other | Admitting: Family

## 2022-12-21 ENCOUNTER — Encounter: Payer: Self-pay | Admitting: Family

## 2022-12-21 ENCOUNTER — Ambulatory Visit (HOSPITAL_BASED_OUTPATIENT_CLINIC_OR_DEPARTMENT_OTHER): Payer: Medicare Other | Admitting: Family

## 2022-12-21 VITALS — BP 152/80 | HR 79 | Wt 133.6 lb

## 2022-12-21 DIAGNOSIS — N1831 Chronic kidney disease, stage 3a: Secondary | ICD-10-CM | POA: Diagnosis not present

## 2022-12-21 DIAGNOSIS — I5022 Chronic systolic (congestive) heart failure: Secondary | ICD-10-CM | POA: Diagnosis not present

## 2022-12-21 DIAGNOSIS — D509 Iron deficiency anemia, unspecified: Secondary | ICD-10-CM

## 2022-12-21 DIAGNOSIS — I480 Paroxysmal atrial fibrillation: Secondary | ICD-10-CM

## 2022-12-21 DIAGNOSIS — E1122 Type 2 diabetes mellitus with diabetic chronic kidney disease: Secondary | ICD-10-CM | POA: Diagnosis not present

## 2022-12-21 DIAGNOSIS — I10 Essential (primary) hypertension: Secondary | ICD-10-CM | POA: Diagnosis not present

## 2022-12-21 DIAGNOSIS — Z794 Long term (current) use of insulin: Secondary | ICD-10-CM | POA: Diagnosis not present

## 2022-12-21 LAB — BASIC METABOLIC PANEL
Anion gap: 14 (ref 5–15)
BUN: 19 mg/dL (ref 8–23)
CO2: 27 mmol/L (ref 22–32)
Calcium: 9.2 mg/dL (ref 8.9–10.3)
Chloride: 97 mmol/L — ABNORMAL LOW (ref 98–111)
Creatinine, Ser: 0.88 mg/dL (ref 0.44–1.00)
GFR, Estimated: >60 mL/min (ref >60)
Glucose, Bld: 46 mg/dL — ABNORMAL LOW (ref 70–99)
Potassium: 4.1 mmol/L (ref 3.5–5.1)
Sodium: 138 mmol/L (ref 135–145)

## 2022-12-21 MED ORDER — CARVEDILOL 3.125 MG PO TABS: 3.1250 mg | ORAL_TABLET | Freq: Two times a day (BID) | ORAL | 3 refills | Status: AC

## 2022-12-21 NOTE — Progress Notes (Signed)
Patient ID: [name], female    DOB: 1929-09-23, 89 y.o.   MRN: [id number]  HPI  [name] is a 89 y/o female with a history of DM, hyperlipidemia, HTN, CKD, anemia, PAF, COPD, dysphagia and chronic heart failure.   Echo 08/02/22: EF of 45-50% along with mild LVH/ LAE and moderate MR. Echo 06/15/22: EF of 45-50% along with mild LVH/ LAE and mild MR.   Admitted 07/30/22 due to acute on chronic heart failure. BNP elevated. Initially given IV lasix with transition to oral diuretics. Negative for DVT. Spironolactone held due to rising creatinine. PT/OT evaluation done. Discharged after 5 days. Was in the ED 06/23/22 due to COPD exacerbation where she was treated and released.   She presents today for a HF follow-up visit with a chief complaint of minimal fatigue with moderate exertion. Chronic in nature. Has associated cough and SOB along with this. Denies difficulty sleeping, dizziness, abdominal distention, palpitations, pedal edema, chest pain or weight gain.   Weighing daily with a talking scale and she says that this is working out well for her.   Getting her meds pre-packaged from [facility] on a monthly basis.  Past Medical History:  Diagnosis Date   Anemia    Angina pectoris (HCC)    Atrial fibrillation (HCC)    Basal cell carcinoma 09/30/2022   dorsum nose - [facility]   Cervicalgia    CHF (congestive heart failure) (HCC)    Chronic back pain    Chronic kidney disease    Chronic obstructive pulmonary disease (COPD) (HCC)    COPD (chronic obstructive pulmonary disease) (HCC)    DDD (degenerative disc disease), lumbar    Diabetes mellitus without complication (HCC)    Dysphagia    Hyperlipemia    Hypertension    Squamous cell carcinoma of skin    left lateral pretibial   Vulvovaginitis    Past Surgical History:  Procedure Laterality Date   gall bladder     melanoma     removal neck and back   PERIPHERAL VASCULAR CATHETERIZATION Left 01/05/2016   Procedure:  Lower Extremity Angiography;  Surgeon: Algernon Huxley, MD;  Location: [facility];  Service: Cardiovascular;  Laterality: Left;   PERIPHERAL VASCULAR CATHETERIZATION  01/05/2016   Procedure: Lower Extremity Intervention;  Surgeon: Algernon Huxley, MD;  Location: [facility];  Service: Cardiovascular;;   ureterolithiasis     calculus removed   VAGINAL HYSTERECTOMY     VISCERAL ANGIOGRAPHY N/A 05/10/2019   Procedure: VISCERAL ANGIOGRAPHY;  Surgeon: Algernon Huxley, MD;  Location: [facility];  Service: Cardiovascular;  Laterality: N/A;   VULVA / PERINEUM BIOPSY  05/29/2015   Family History  Problem Relation Age of Onset   Diabetes Sister    Colon cancer Brother    Diabetes Brother    Atrial fibrillation Daughter    Ovarian cancer Other    Breast cancer Neg Hx    Social History   Tobacco Use   Smoking status: Never   Smokeless tobacco: Never  Substance Use Topics   Alcohol use: No   Allergies  Allergen Reactions   Bacitracin-Neomycin-Polymyxin Rash   Neomycin-Bacitracin Zn-Polymyx Swelling and Rash   Latex Rash   Lidocaine Rash   Aricept [Donepezil Hcl] Diarrhea   Benzalkonium Chloride Itching and Swelling   Ibuprofen     Other reaction(s): Dizziness Heart fluttering. Tachycardia. Tachycardia.   Lidocaine Hcl Itching    Per pt, "all caine meds  cause severe itching"   Valdecoxib Nausea And Vomiting   Albuterol Rash   Tape Rash    blisters   Triamcinolone Rash   Prior to Admission medications   Medication Sig Start Date End Date Taking? Authorizing Provider  acetaminophen (TYLENOL) 325 MG tablet Take 2 tablets (650 mg total) by mouth every 6 (six) hours as needed for mild pain (or Fever >/= 101). 08/04/22  Yes Wieting, Richard, MD  albuterol (VENTOLIN HFA) 108 (90 Base) MCG/ACT inhaler INHALE 2 PUFFS INTO THE LUNGS EVERY 4 HOURS AS NEEDED FOR WHEEZING ORSHORTNESS OF BREATH 04/12/22  Yes Eulas Post, MD  aspirin EC 81 MG tablet Take 1 tablet (81 mg  total) by mouth daily. Swallow whole. Patient taking differently: Take 325 mg by mouth daily. Swallow whole. 08/05/22  Yes Wieting, Richard, MD  atorvastatin (LIPITOR) 10 MG tablet Take 1 tablet (10 mg total) by mouth daily. 10/01/22  Yes Ostwalt, Letitia Libra, PA-C  clopidogrel (PLAVIX) 75 MG tablet Take 1 tablet (75 mg total) by mouth daily. 08/24/22  Yes Myles Gip, DO  cyanocobalamin 1000 MCG tablet Take 1 tablet (1,000 mcg total) by mouth daily. 06/18/22  Yes Fritzi Mandes, MD  Dupilumab (DUPIXENT) 300 MG/2ML SOPN Inject 300 mg into the skin every 14 (fourteen) days. Starting at day 15 for maintenance. 09/30/22  Yes Ralene Bathe, MD  ezetimibe (ZETIA) 10 MG tablet TAKE 1 TABLET BY MOUTH DAILY 04/30/22  Yes Eulas Post, MD  fexofenadine (ALLEGRA) 180 MG tablet Take 180 mg by mouth daily.   Yes [provider]  FLUoxetine (PROZAC) 10 MG capsule TAKE 1 CAPSULE BY MOUTH 3 TIMES DAILY. 04/30/22  Yes Eulas Post, MD  glucose blood test strip ACCU-CHEK AVIVA PLUS TEST STRP Use to check sugars 3 times daily. 10/28/15  Yes [provider]  HUMALOG KWIKPEN 100 UNIT/ML KwikPen Inject 3 Units into the skin 3 (three) times daily. 08/04/22  Yes Wieting, Richard, MD  insulin glargine (LANTUS) 100 UNIT/ML injection Inject 0.06 mLs (6 Units total) into the skin at bedtime. 08/04/22  Yes Wieting, Richard, MD  iron polysaccharides (NIFEREX) 150 MG capsule Take 1 capsule (150 mg total) by mouth daily. 08/31/22  Yes Darylene Price A, FNP  isosorbide mononitrate (IMDUR) 30 MG 24 hr tablet Take 1 tablet (30 mg total) by mouth daily. Take 30 mg by mouth daily. 08/24/22  Yes Myles Gip, DO  lansoprazole (PREVACID) 30 MG capsule TAKE 1 CAPSULE BY MOUTH ONCE DAILY AT NOON Patient taking differently: Take 30 mg by mouth daily at 12 noon. 06/01/22  Yes Eulas Post, MD  memantine (NAMENDA) 10 MG tablet Take 10 mg by mouth 2 (two) times daily. 12/31/21  Yes [provider]  Multiple  Vitamins-Minerals (ICAPS AREDS 2 PO) Take 2 capsules by mouth daily at 6 (six) AM.   Yes [provider]  QUEtiapine (SEROQUEL) 50 MG tablet Take 50 mg by mouth at bedtime. 11/09/21  Yes [provider]  sacubitril-valsartan (ENTRESTO) 24-26 MG Take 1 tablet by mouth 2 (two) times daily. 10/19/22  Yes Darylene Price A, FNP  vitamin E 1000 UNIT capsule Take 1,000 Units by mouth daily.   Yes [provider]    Review of Systems  Constitutional:  Positive for fatigue. Negative for appetite change.  HENT:  Negative for congestion, postnasal drip and sore throat.   Eyes:  Positive for visual disturbance (limited vision).  Respiratory:  Positive for cough and shortness of breath (very  little). Negative for chest tightness.   Cardiovascular:  Negative for chest pain, palpitations and leg swelling.  Gastrointestinal:  Negative for abdominal distention and abdominal pain.  Endocrine: Negative.   Genitourinary: Negative.   Musculoskeletal:  Negative for back pain and neck pain.  Skin: Negative.   Allergic/Immunologic: Negative.   Neurological:  Negative for dizziness and light-headedness.  Hematological:  Negative for adenopathy. Does not bruise/bleed easily.  Psychiatric/Behavioral:  Negative for dysphoric mood and sleep disturbance (sleeping in recliner due to comfort). The patient is not nervous/anxious.    Vitals:   12/21/22 0929  BP: (!) 152/80  Pulse: 79  SpO2: 98%  Weight: 133 lb 9.6 oz (60.6 kg)   Wt Readings from Last 3 Encounters:  12/21/22 133 lb 9.6 oz (60.6 kg)  10/19/22 133 lb 8 oz (60.6 kg)  08/31/22 131 lb 8 oz (59.6 kg)   Lab Results  Component Value Date   CREATININE 1.33 (H) 08/04/2022   CREATININE 1.19 (H) 08/03/2022   CREATININE 1.32 (H) 08/02/2022   Physical Exam Vitals and nursing note reviewed. Exam conducted with a chaperone present (son).  Constitutional:      Appearance: Normal appearance.  HENT:     Head: Normocephalic and  atraumatic.  Cardiovascular:     Rate and Rhythm: Normal rate and regular rhythm.  Pulmonary:     Effort: Pulmonary effort is normal. No respiratory distress.     Breath sounds: No wheezing or rales.  Abdominal:     General: There is no distension.     Palpations: Abdomen is soft.     Tenderness: There is no abdominal tenderness.  Musculoskeletal:        General: No tenderness.     Cervical back: Normal range of motion and neck supple.     Right lower leg: No edema.     Left lower leg: No edema.  Skin:    General: Skin is warm and dry.  Neurological:     General: No focal deficit present.     Mental Status: She is alert and oriented to person, place, and time.  Psychiatric:        Mood and Affect: Mood normal.        Behavior: Behavior normal.        Thought Content: Thought content normal.   Assessment & Plan:  1: Chronic heart failure with mildly reduced ejection fraction- - NYHA class II - euvolemic today - weighing daily with the talking scales; call for an overnight weight gain of > 2 pounds or a weekly weight gain of > 5 pounds - weight unchanged from last visit here 2 months ago - not adding salt to her food and DIL doesn't cook with salt; does eat micrwaveable breakfast but she still doesn't go over 2000mg  - son says that they are reading food labels for sodium content - refilled carvedilol 3.125mg  BID; somehow this had been stopped, ? Needing refills - continue entresto 24/26mg  BID - BMP today - walking with her walker  - BNP 07/30/22 was 1821.3  2: HTN- - BP 152/80; resuming carvedilol per above - saw PCP (Oswalt) 08/16/22 - BMP 08/04/22 reviewed and showed sodium 129, potassium 3.7, creatinine 1.33 and GFR 38 - BMP today  3: DM with CKD- - saw endocrinology Doran Clay) 11/09/22 - A1c 11/09/22 was 7.1%  4: Anemia- - saw hematology Janese Banks) 08/31/22 - hemoglobin 11/30/22 was 14.0 - had iron infusion 07/06/22  5: PAF- - saw cardiology Nehemiah Massed)  08/15/20; f/u has  to be rescheduled   Return in 2 months, sooner if needed.

## 2022-12-21 NOTE — Patient Instructions (Addendum)
Go to the [facility] to get your lab work drawn   Go to [facility] and get the carvedilol picked up

## 2022-12-30 DIAGNOSIS — I10 Essential (primary) hypertension: Secondary | ICD-10-CM | POA: Diagnosis not present

## 2022-12-30 DIAGNOSIS — I5022 Chronic systolic (congestive) heart failure: Secondary | ICD-10-CM | POA: Diagnosis not present

## 2022-12-30 DIAGNOSIS — I700 Atherosclerosis of aorta: Secondary | ICD-10-CM | POA: Diagnosis not present

## 2022-12-30 DIAGNOSIS — I2510 Atherosclerotic heart disease of native coronary artery without angina pectoris: Secondary | ICD-10-CM | POA: Diagnosis not present

## 2022-12-30 DIAGNOSIS — E1159 Type 2 diabetes mellitus with other circulatory complications: Secondary | ICD-10-CM | POA: Diagnosis not present

## 2022-12-30 DIAGNOSIS — E782 Mixed hyperlipidemia: Secondary | ICD-10-CM | POA: Diagnosis not present

## 2022-12-30 DIAGNOSIS — I480 Paroxysmal atrial fibrillation: Secondary | ICD-10-CM | POA: Diagnosis not present

## 2023-01-04 DIAGNOSIS — I480 Paroxysmal atrial fibrillation: Secondary | ICD-10-CM | POA: Diagnosis not present

## 2023-01-04 DIAGNOSIS — I110 Hypertensive heart disease with heart failure: Secondary | ICD-10-CM | POA: Diagnosis not present

## 2023-01-04 DIAGNOSIS — D509 Iron deficiency anemia, unspecified: Secondary | ICD-10-CM | POA: Diagnosis not present

## 2023-01-04 DIAGNOSIS — J301 Allergic rhinitis due to pollen: Secondary | ICD-10-CM | POA: Diagnosis not present

## 2023-01-04 DIAGNOSIS — F02818 Dementia in other diseases classified elsewhere, unspecified severity, with other behavioral disturbance: Secondary | ICD-10-CM | POA: Diagnosis not present

## 2023-01-04 DIAGNOSIS — E118 Type 2 diabetes mellitus with unspecified complications: Secondary | ICD-10-CM | POA: Diagnosis not present

## 2023-01-04 DIAGNOSIS — I509 Heart failure, unspecified: Secondary | ICD-10-CM | POA: Diagnosis not present

## 2023-01-04 DIAGNOSIS — I700 Atherosclerosis of aorta: Secondary | ICD-10-CM | POA: Diagnosis not present

## 2023-01-04 DIAGNOSIS — R059 Cough, unspecified: Secondary | ICD-10-CM | POA: Diagnosis not present

## 2023-01-04 DIAGNOSIS — I5022 Chronic systolic (congestive) heart failure: Secondary | ICD-10-CM | POA: Diagnosis not present

## 2023-01-04 DIAGNOSIS — R269 Unspecified abnormalities of gait and mobility: Secondary | ICD-10-CM | POA: Diagnosis not present

## 2023-01-11 ENCOUNTER — Ambulatory Visit: Payer: Self-pay | Admitting: *Deleted

## 2023-01-11 NOTE — Patient Outreach (Signed)
  Care Coordination   01/11/2023 Name: [name] MRN: [id number] DOB: 17-Jul-1929   Care Coordination Outreach Attempts:  An unsuccessful telephone outreach was attempted for a scheduled appointment today.  Follow Up Plan:  Additional outreach attempts will be made to offer the patient care coordination information and services.   Encounter Outcome:  No Answer   Care Coordination Interventions:  No, not indicated    Kemper Durie, RN, MSN, [facility] Care Management Care Management Coordinator [phone]

## 2023-01-12 ENCOUNTER — Telehealth: Payer: Self-pay | Admitting: *Deleted

## 2023-01-12 NOTE — Progress Notes (Signed)
  Care Coordination Note  01/12/2023 Name: [name] MRN: [id number] DOB: July 20, 1929  [name] is a 89 y.o. year old female who is a primary care patient of Debera Lat, Cordelia Poche and is actively engaged with the care management team. I reached out to [name] by phone today to assist with re-scheduling a follow up visit with the RN Case Manager  Follow up plan: Pt son declined to reschedule at this time, says pt is doing well - contact info given if services needed in the future   Burman Nieves, [facility] Care Coordination Care Guide Direct Dial: [phone]

## 2023-01-19 ENCOUNTER — Other Ambulatory Visit: Payer: Self-pay | Admitting: Physician Assistant

## 2023-01-19 ENCOUNTER — Other Ambulatory Visit: Payer: Self-pay | Admitting: Family Medicine

## 2023-01-19 DIAGNOSIS — E7800 Pure hypercholesterolemia, unspecified: Secondary | ICD-10-CM

## 2023-01-19 NOTE — Telephone Encounter (Signed)
Requested medication (s) are due for refill today: yes  Requested medication (s) are on the active medication list: yes  Last refill:  08/24/22 #30 3 refills  Future visit scheduled: no   Notes to clinic:  do you want to refill Rx?      Requested Prescriptions  Pending Prescriptions Disp Refills   clopidogrel (PLAVIX) 75 MG tablet [Pharmacy Med Name: CLOPIDOGREL BISULFATE 75 MG TAB] 30 tablet 3    Sig: TAKE 1 TABLET BY MOUTH DAILY     Hematology: Antiplatelets - clopidogrel Passed - 01/19/2023  8:29 AM      Passed - HCT in normal range and within 180 days    HCT  Date Value Ref Range Status  11/30/2022 42.4 36.0 - 46.0 % Final   Hematocrit  Date Value Ref Range Status  05/08/2021 32.2 (L) 34.0 - 46.6 % Final         Passed - HGB in normal range and within 180 days    Hemoglobin  Date Value Ref Range Status  11/30/2022 14.0 12.0 - 15.0 g/dL Final  16/06/9603 9.6 (L) 11.1 - 15.9 g/dL Final         Passed - PLT in normal range and within 180 days    Platelets  Date Value Ref Range Status  11/30/2022 258 150 - 400 K/uL Final  05/08/2021 331 150 - 450 x10E3/uL Final         Passed - Cr in normal range and within 360 days    Creat  Date Value Ref Range Status  06/07/2017 1.19 (H) 0.60 - 0.88 mg/dL Final    Comment:    For patients >53 years of age, the reference limit for Creatinine is approximately 13% higher for people identified as African-American. .    Creatinine, Ser  Date Value Ref Range Status  12/21/2022 0.88 0.44 - 1.00 mg/dL Final         Passed - Valid encounter within last 6 months    Recent Outpatient Visits           5 months ago Hyponatremia   [facility] [place], [place], PA-C   9 months ago Hypercholesteremia   [place] [facility] Bosie Clos, MD   1 year ago Moderate Lewy body dementia with anxiety [facility])   [place] [facility] Bosie Clos, MD   1 year ago  Moderate Lewy body dementia with anxiety [facility])   [facility] Bosie Clos, MD   1 year ago Late onset Alzheimer's dementia without behavioral disturbance [facility])   [facility] Bosie Clos, MD

## 2023-02-04 DIAGNOSIS — E1165 Type 2 diabetes mellitus with hyperglycemia: Secondary | ICD-10-CM | POA: Diagnosis not present

## 2023-02-10 DIAGNOSIS — Z03818 Encounter for observation for suspected exposure to other biological agents ruled out: Secondary | ICD-10-CM | POA: Diagnosis not present

## 2023-02-10 DIAGNOSIS — R059 Cough, unspecified: Secondary | ICD-10-CM | POA: Diagnosis not present

## 2023-02-10 DIAGNOSIS — I10 Essential (primary) hypertension: Secondary | ICD-10-CM | POA: Diagnosis not present

## 2023-02-10 DIAGNOSIS — R058 Other specified cough: Secondary | ICD-10-CM | POA: Diagnosis not present

## 2023-02-10 DIAGNOSIS — J40 Bronchitis, not specified as acute or chronic: Secondary | ICD-10-CM | POA: Diagnosis not present

## 2023-02-15 ENCOUNTER — Encounter: Payer: Self-pay | Admitting: Dermatology

## 2023-02-15 ENCOUNTER — Ambulatory Visit: Payer: Medicare Other | Admitting: Dermatology

## 2023-02-15 DIAGNOSIS — L24A2 Irritant contact dermatitis due to fecal, urinary or dual incontinence: Secondary | ICD-10-CM

## 2023-02-15 DIAGNOSIS — Z794 Long term (current) use of insulin: Secondary | ICD-10-CM | POA: Diagnosis not present

## 2023-02-15 DIAGNOSIS — E1122 Type 2 diabetes mellitus with diabetic chronic kidney disease: Secondary | ICD-10-CM | POA: Diagnosis not present

## 2023-02-15 DIAGNOSIS — N1832 Chronic kidney disease, stage 3b: Secondary | ICD-10-CM | POA: Diagnosis not present

## 2023-02-15 MED ORDER — HYDROCORTISONE 2.5 % EX CREA: TOPICAL_CREAM | CUTANEOUS | 2 refills | Status: DC

## 2023-02-15 MED ORDER — MICONAZOLE-ZINC OXIDE-PETROLAT 0.25-15-81.35 % EX OINT: TOPICAL_OINTMENT | CUTANEOUS | 2 refills | Status: DC

## 2023-02-15 MED ORDER — KETOCONAZOLE 2 % EX CREA: TOPICAL_CREAM | CUTANEOUS | 2 refills | Status: DC

## 2023-02-15 NOTE — Patient Instructions (Addendum)
Start Ketoconazole 2% cream apply twice daily for 2 weeks; apply 1st  Start Hydrocortisone 2.5% ointment apply twice daily for 2 weeks, apply 2nd  Start using Vusion twice daily after 2 weeks  Change pads or pull ups as soon as they are wet.   Discuss bladder control medication with PCP or urologist.    Due to recent changes in healthcare laws, you may see results of your pathology and/or laboratory studies on MyChart before the doctors have had a chance to review them. We understand that in some cases there may be results that are confusing or concerning to you. Please understand that not all results are received at the same time and often the doctors may need to interpret multiple results in order to provide you with the best plan of care or course of treatment. Therefore, we ask that you please give [place] 2 business days to thoroughly review all your results before contacting the office for clarification. Should we see a critical lab result, you will be contacted sooner.   If You Need Anything After Your Visit  If you have any questions or concerns for your doctor, please call our main line at [phone] and press option 4 to reach your doctor's medical assistant. If no one answers, please leave a voicemail as directed and we will return your call as soon as possible. Messages left after 4 pm will be answered the following business day.   You may also send [place] a message via MyChart. We typically respond to MyChart messages within 1-2 business days.  For prescription refills, please ask your pharmacy to contact our office. Our fax number is [phone].  If you have an urgent issue when the clinic is closed that cannot wait until the next business day, you can page your doctor at the number below.    Please note that while we do our best to be available for urgent issues outside of office hours, we are not available 24/7.   If you have an urgent issue and are unable to reach [place], you may  choose to seek medical care at your doctor's office, retail clinic, urgent care center, or emergency room.  If you have a medical emergency, please immediately call 911 or go to the emergency department.  Pager Numbers  - Dr. Gwen Pounds: [phone]  - Dr. Neale Burly: [phone]  - Dr. Roseanne Reno: [phone]  In the event of inclement weather, please call our main line at [phone] for an update on the status of any delays or closures.  Dermatology Medication Tips: Please keep the boxes that topical medications come in in order to help keep track of the instructions about where and how to use these. Pharmacies typically print the medication instructions only on the boxes and not directly on the medication tubes.   If your medication is too expensive, please contact our office at [phone] option 4 or send [place] a message through MyChart.   We are unable to tell what your co-pay for medications will be in advance as this is different depending on your insurance coverage. However, we may be able to find a substitute medication at lower cost or fill out paperwork to get insurance to cover a needed medication.   If a prior authorization is required to get your medication covered by your insurance company, please allow [place] 1-2 business days to complete this process.  Drug prices often vary depending on where the prescription is filled and some pharmacies may offer cheaper prices.  The  website www.goodrx.com contains coupons for medications through different pharmacies. The prices here do not account for what the cost may be with help from insurance (it may be cheaper with your insurance), but the website can give you the price if you did not use any insurance.  - You can print the associated coupon and take it with your prescription to the pharmacy.  - You may also stop by our office during regular business hours and pick up a GoodRx coupon card.  - If you need your prescription sent  electronically to a different pharmacy, notify our office through [facility] or by phone at [phone] option 4.     Si Usted Necesita Algo Despus de Su Visita  Tambin puede enviarnos un mensaje a travs de Clinical cytogeneticist. Por lo general respondemos a los mensajes de MyChart en el transcurso de 1 a 2 das hbiles.  Para renovar recetas, por favor pida a su farmacia que se ponga en contacto con nuestra oficina. Annie Sable de fax es [place] [phone].  Si tiene un asunto urgente cuando la clnica est cerrada y que no puede esperar hasta el siguiente da hbil, puede llamar/localizar a su doctor(a) al nmero que aparece a continuacin.   Por favor, tenga en cuenta que aunque hacemos todo lo posible para estar disponibles para asuntos urgentes fuera del horario de [place], no estamos disponibles las 24 horas del da, los 7 [address]  [address] de la [place].   Si tiene un problema urgente y no puede comunicarse con nosotros, puede optar por buscar atencin mdica  en el consultorio de su doctor(a), en una clnica privada, en un centro de atencin urgente o en una sala de emergencias.  Si tiene Engineer, drilling, por favor llame inmediatamente al 911 o vaya a la sala de emergencias.  Nmeros de bper  - Dr. Gwen Pounds: [phone]  - Dra. Moye: [phone]  - Dra. Roseanne Reno: [phone]  En caso de inclemencias del [place], por favor llame a Lacy Duverney principal al [phone] para una actualizacin sobre el [place] de cualquier retraso o cierre.  Consejos para la medicacin en dermatologa: Por favor, guarde las cajas en las que vienen los medicamentos de uso tpico para ayudarle a seguir las instrucciones sobre dnde y cmo usarlos. Las farmacias generalmente imprimen las instrucciones del medicamento slo en las cajas y no directamente en los tubos del [place].   Si su medicamento es muy caro, por favor, pngase en contacto con Rolm Gala llamando al [phone] y presione la  opcin 4 o envenos un mensaje a travs de Clinical cytogeneticist.   No podemos decirle cul ser su copago por los medicamentos por adelantado ya que esto es diferente dependiendo de la cobertura de su seguro. Sin embargo, es posible que podamos encontrar un medicamento sustituto a Audiological scientist un formulario para que el seguro cubra el medicamento que se considera necesario.   Si se requiere una autorizacin previa para que su compaa de seguros [place] su medicamento, por favor permtanos de 1 a 2 das hbiles para completar [address]  Los precios de los medicamentos varan con frecuencia dependiendo del Environmental consultant de dnde se surte la receta y alguna farmacias pueden ofrecer precios ms baratos.  El sitio web www.goodrx.com tiene cupones para medicamentos de Health and safety inspector. Los precios aqu no tienen en cuenta lo que podra costar con la ayuda del seguro (puede ser ms barato con su seguro), pero el sitio web puede darle el precio si no utiliz Tourist information centre manager.  - Puede  imprimir el cupn correspondiente y llevarlo con su receta a la farmacia.  - Tambin puede pasar por nuestra oficina durante el horario de atencin regular y Education officer, museum una tarjeta de cupones de GoodRx.  - Si necesita que su receta se enve electrnicamente a una farmacia diferente, informe a nuestra oficina a travs de MyChart de [place] o por telfono llamando al [phone] y presione la opcin 4.

## 2023-02-15 NOTE — Progress Notes (Signed)
   Follow Up Visit   Subjective  [name] is a 89 y.o. female who presents for the following: Rash at groin area, bends of legs. Has been using Endit! Zinc Oxide cream. Skin improved but was discolored. Dur: few months. Has flared up again, daughter has seen blood on her pants. Wears adult pull ups or pads due to leaking after taking diuretics while in hospital.   Patient accompanied by daughter who contributes to history.   The following portions of the chart were reviewed this encounter and updated as appropriate: medications, allergies, medical history  Review of Systems:  No other skin or systemic complaints except as noted in HPI or Assessment and Plan.  Objective  Well appearing patient in no apparent distress; mood and affect are within normal limits.  A focused examination was performed of the following areas: Groin, buttocks  Relevant exam findings are noted in the Assessment and Plan.    Assessment & Plan    Irritant Contact Dermatitis due to urinary incontinence Exam: diffuse bright erythema/scale perineal groin at vulva, medial thighs, medial lower buttocks  Chronic and persistent condition with duration or expected duration over one year. Condition is symptomatic/ bothersome to patient. Not currently at goal.  Treatment Plan:  Start Ketoconazole 2% cream apply twice daily for 2 weeks; apply 1st Start Hydrocortisone 2.5% ointment twice daily for 2 weeks, apply 2nd  Start using Vusion bid after 2 weeks (or OTC Zinc oxide cream if not covered)  Change pads or pull ups as soon as they are wet.  Discuss bladder control medication with PCP or urologist.   Return for Rash Follow Up in 2-3 weeks.  I, Lawson Radar, CMA, am acting as scribe for Willeen Niece, MD.   Documentation: I have reviewed the above documentation for accuracy and completeness, and I agree with the above.  Willeen Niece, MD

## 2023-02-22 ENCOUNTER — Other Ambulatory Visit: Payer: Self-pay | Admitting: Family Medicine

## 2023-02-22 DIAGNOSIS — N1831 Chronic kidney disease, stage 3a: Secondary | ICD-10-CM | POA: Diagnosis not present

## 2023-02-22 DIAGNOSIS — E1159 Type 2 diabetes mellitus with other circulatory complications: Secondary | ICD-10-CM | POA: Diagnosis not present

## 2023-02-22 DIAGNOSIS — I10 Essential (primary) hypertension: Secondary | ICD-10-CM | POA: Diagnosis not present

## 2023-02-22 DIAGNOSIS — H353133 Nonexudative age-related macular degeneration, bilateral, advanced atrophic without subfoveal involvement: Secondary | ICD-10-CM | POA: Diagnosis not present

## 2023-02-22 DIAGNOSIS — E1122 Type 2 diabetes mellitus with diabetic chronic kidney disease: Secondary | ICD-10-CM | POA: Diagnosis not present

## 2023-02-22 DIAGNOSIS — E1129 Type 2 diabetes mellitus with other diabetic kidney complication: Secondary | ICD-10-CM | POA: Diagnosis not present

## 2023-02-22 DIAGNOSIS — R809 Proteinuria, unspecified: Secondary | ICD-10-CM | POA: Diagnosis not present

## 2023-02-22 DIAGNOSIS — Z794 Long term (current) use of insulin: Secondary | ICD-10-CM | POA: Diagnosis not present

## 2023-02-23 NOTE — Telephone Encounter (Signed)
Requested medication (s) are due for refill today: yes  Requested medication (s) are on the active medication list: yes  Last refill:  08/24/22 #30/3  Future visit scheduled: no  Notes to clinic:  pt last seen Dr. Sullivan Lone at [facility], send to his practice correct?    Requested Prescriptions  Pending Prescriptions Disp Refills   clopidogrel (PLAVIX) 75 MG tablet [Pharmacy Med Name: CLOPIDOGREL BISULFATE 75 MG TAB] 30 tablet 3    Sig: TAKE 1 TABLET BY MOUTH DAILY     Hematology: Antiplatelets - clopidogrel Failed - 02/22/2023  3:30 PM      Failed - Valid encounter within last 6 months    Recent Outpatient Visits           6 months ago Hyponatremia   [place] [facility] [place], PA-C   10 months ago Hypercholesteremia   [place] [facility] Bosie Clos, MD   1 year ago Moderate Lewy body dementia with anxiety [facility])   [place] [facility] Bosie Clos, MD   1 year ago Moderate Lewy body dementia with anxiety [facility])   [place] [facility] Bosie Clos, MD   1 year ago Late onset Alzheimer's dementia without behavioral disturbance [facility])   [place] [facility] Bosie Clos, MD       Future Appointments             In 2 weeks Willeen Niece, MD [facility]            Passed - HCT in normal range and within 180 days    HCT  Date Value Ref Range Status  11/30/2022 42.4 36.0 - 46.0 % Final   Hematocrit  Date Value Ref Range Status  05/08/2021 32.2 (L) 34.0 - 46.6 % Final         Passed - HGB in normal range and within 180 days    Hemoglobin  Date Value Ref Range Status  11/30/2022 14.0 12.0 - 15.0 g/dL Final  16/06/9603 9.6 (L) 11.1 - 15.9 g/dL Final         Passed - PLT in normal range and within 180 days    Platelets  Date Value Ref Range Status  11/30/2022 258 150 - 400 K/uL Final  05/08/2021 331 150 - 450 x10E3/uL Final          Passed - Cr in normal range and within 360 days    Creat  Date Value Ref Range Status  06/07/2017 1.19 (H) 0.60 - 0.88 mg/dL Final    Comment:    For patients >41 years of age, the reference limit for Creatinine is approximately 13% higher for people identified as African-American. .    Creatinine, Ser  Date Value Ref Range Status  12/21/2022 0.88 0.44 - 1.00 mg/dL Final

## 2023-02-24 ENCOUNTER — Other Ambulatory Visit (HOSPITAL_COMMUNITY): Payer: Self-pay

## 2023-02-24 ENCOUNTER — Encounter: Payer: Self-pay | Admitting: Family

## 2023-02-24 ENCOUNTER — Ambulatory Visit: Payer: Medicare Other | Attending: Family | Admitting: Family

## 2023-02-24 VITALS — BP 162/78 | HR 69 | Wt 137.4 lb

## 2023-02-24 DIAGNOSIS — I480 Paroxysmal atrial fibrillation: Secondary | ICD-10-CM | POA: Insufficient documentation

## 2023-02-24 DIAGNOSIS — I2510 Atherosclerotic heart disease of native coronary artery without angina pectoris: Secondary | ICD-10-CM

## 2023-02-24 DIAGNOSIS — N1831 Chronic kidney disease, stage 3a: Secondary | ICD-10-CM | POA: Diagnosis not present

## 2023-02-24 DIAGNOSIS — E785 Hyperlipidemia, unspecified: Secondary | ICD-10-CM | POA: Diagnosis not present

## 2023-02-24 DIAGNOSIS — E1122 Type 2 diabetes mellitus with diabetic chronic kidney disease: Secondary | ICD-10-CM | POA: Diagnosis not present

## 2023-02-24 DIAGNOSIS — I700 Atherosclerosis of aorta: Secondary | ICD-10-CM | POA: Insufficient documentation

## 2023-02-24 DIAGNOSIS — Z7982 Long term (current) use of aspirin: Secondary | ICD-10-CM | POA: Insufficient documentation

## 2023-02-24 DIAGNOSIS — N189 Chronic kidney disease, unspecified: Secondary | ICD-10-CM | POA: Diagnosis not present

## 2023-02-24 DIAGNOSIS — R1310 Dysphagia, unspecified: Secondary | ICD-10-CM | POA: Insufficient documentation

## 2023-02-24 DIAGNOSIS — I10 Essential (primary) hypertension: Secondary | ICD-10-CM

## 2023-02-24 DIAGNOSIS — D649 Anemia, unspecified: Secondary | ICD-10-CM | POA: Insufficient documentation

## 2023-02-24 DIAGNOSIS — I5022 Chronic systolic (congestive) heart failure: Secondary | ICD-10-CM | POA: Diagnosis not present

## 2023-02-24 DIAGNOSIS — I259 Chronic ischemic heart disease, unspecified: Secondary | ICD-10-CM | POA: Diagnosis not present

## 2023-02-24 DIAGNOSIS — Z794 Long term (current) use of insulin: Secondary | ICD-10-CM

## 2023-02-24 DIAGNOSIS — Z7902 Long term (current) use of antithrombotics/antiplatelets: Secondary | ICD-10-CM | POA: Diagnosis not present

## 2023-02-24 DIAGNOSIS — Z79899 Other long term (current) drug therapy: Secondary | ICD-10-CM | POA: Insufficient documentation

## 2023-02-24 DIAGNOSIS — D509 Iron deficiency anemia, unspecified: Secondary | ICD-10-CM | POA: Diagnosis not present

## 2023-02-24 DIAGNOSIS — I130 Hypertensive heart and chronic kidney disease with heart failure and stage 1 through stage 4 chronic kidney disease, or unspecified chronic kidney disease: Secondary | ICD-10-CM | POA: Insufficient documentation

## 2023-02-24 DIAGNOSIS — K5900 Constipation, unspecified: Secondary | ICD-10-CM

## 2023-02-24 DIAGNOSIS — J449 Chronic obstructive pulmonary disease, unspecified: Secondary | ICD-10-CM | POA: Insufficient documentation

## 2023-02-24 MED ORDER — SACUBITRIL-VALSARTAN 49-51 MG PO TABS: 1.0000 | ORAL_TABLET | Freq: Two times a day (BID) | ORAL | 5 refills | Status: DC

## 2023-02-24 NOTE — Progress Notes (Signed)
PCP: Tammy Manson, MD (last seen 04/24) Primary Cardiologist: Tammy Peng, MD (last seen 04/24; returns 08/24)  HPI:  [name] is a 89 y/o female with a history of DM, hyperlipidemia, HTN, CKD, CAD by CT, aortic atherosclerosis, anemia, PAF, COPD, dysphagia and chronic heart failure.   Echo 08/02/22: EF of 45-50% along with mild LVH/ LAE and moderate MR. Echo 06/15/22: EF of 45-50% along with mild LVH/ LAE and mild MR.   Admitted 07/30/22 due to acute on chronic heart failure. BNP elevated. Initially given IV lasix with transition to oral diuretics. Negative for DVT. Spironolactone held due to rising creatinine. PT/OT evaluation done. Discharged after 5 days. Was in the ED 06/23/22 due to COPD exacerbation where she was treated and released.   She presents today for a HF follow-up visit with a chief complaint minimal SOB with moderate exertion. Chronic in nature. Has associated productive cough, constipation and head congestion along with this. Denies chest pain, palpitations, fever, edema, dizziness or difficulty sleeping.   Went to walk-in clinic 2 weeks ago for bronchitis and has finished a course of antibiotics but continues to have a loose productive cough.   Daughter thinks patient is taking a fluid pill but forgot the pill packs today. [facility] provider notes have furosemide 40mg  listed. She will take today's med list and compare it to the pill packs and let us know of any discrepancies.   ROS: All systems negative except as listed in HPI, PMH and Problem List.  SH:  Social History   Socioeconomic History   Marital status: Widowed    Spouse name: Not on file   Number of children: 3   Years of education: Not on file   Highest education level: 8th grade  Occupational History   Occupation: retired  Tobacco Use   Smoking status: Never   Smokeless tobacco: Never  Vaping Use   Vaping Use: Never used  Substance and Sexual Activity   Alcohol use: No   Drug use: No    Sexual activity: Never  Other Topics Concern   Not on file  Social History Narrative   Not on file   Social Determinants of Health   Financial Resource Strain: Low Risk  (05/20/2022)   Overall Financial Resource Strain (CARDIA)    Difficulty of Paying Living Expenses: Not hard at all  Food Insecurity: No Food Insecurity (11/08/2022)   Hunger Vital Sign    Worried About Running Out of Food in the Last Year: Never true    Ran Out of Food in the Last Year: Never true  Transportation Needs: No Transportation Needs (11/08/2022)   PRAPARE - Administrator, Civil Service (Medical): No    Lack of Transportation (Non-Medical): No  Physical Activity: Insufficiently Active (05/20/2022)   Exercise Vital Sign    Days of Exercise per Week: 2 days    Minutes of Exercise per Session: 20 min  Stress: No Stress Concern Present (05/20/2022)   [employer] of Occupational Health - Occupational Stress Questionnaire    Feeling of Stress : Not at all  Social Connections: Moderately Isolated (05/20/2022)   Social Connection and Isolation Panel [NHANES]    Frequency of Communication with Friends and Family: More than three times a week    Frequency of Social Gatherings with Friends and Family: More than three times a week    Attends Religious Services: More than 4 times per year    Active Member of Clubs or Organizations: No  Attends Banker Meetings: Never    Marital Status: Widowed  Intimate Partner Violence: Not At Risk (07/31/2022)   Humiliation, Afraid, Rape, and Kick questionnaire    Fear of Current or Ex-Partner: No    Emotionally Abused: No    Physically Abused: No    Sexually Abused: No    FH:  Family History  Problem Relation Age of Onset   Diabetes Sister    Colon cancer Brother    Diabetes Brother    Atrial fibrillation Daughter    Ovarian cancer Other    Breast cancer Neg Hx     Past Medical History:  Diagnosis Date   Anemia    Angina pectoris  (HCC)    Atrial fibrillation (HCC)    Basal cell carcinoma 09/30/2022   dorsum nose - EDC   Cervicalgia    CHF (congestive heart failure) (HCC)    Chronic back pain    Chronic kidney disease    Chronic obstructive pulmonary disease (COPD) (HCC)    COPD (chronic obstructive pulmonary disease) (HCC)    DDD (degenerative disc disease), lumbar    Diabetes mellitus without complication (HCC)    Dysphagia    Hyperlipemia    Hypertension    Squamous cell carcinoma of skin    left lateral pretibial   Vulvovaginitis     Current Outpatient Medications  Medication Sig Dispense Refill   acetaminophen (TYLENOL) 325 MG tablet Take 2 tablets (650 mg total) by mouth every 6 (six) hours as needed for mild pain (or Fever >/= 101).     albuterol (VENTOLIN HFA) 108 (90 Base) MCG/ACT inhaler INHALE 2 PUFFS INTO THE LUNGS EVERY 4 HOURS AS NEEDED FOR WHEEZING ORSHORTNESS OF BREATH 8.5 g 10   aspirin EC 81 MG tablet Take 1 tablet (81 mg total) by mouth daily. Swallow whole. (Patient taking differently: Take 325 mg by mouth daily. Swallow whole.) 30 tablet 0   atorvastatin (LIPITOR) 10 MG tablet TAKE 1 TABLET BY MOUTH DAILY 90 tablet 0   carvedilol (COREG) 3.125 MG tablet Take 1 tablet (3.125 mg total) by mouth 2 (two) times daily. 180 tablet 3   clopidogrel (PLAVIX) 75 MG tablet Take 1 tablet (75 mg total) by mouth daily. 30 tablet 3   cyanocobalamin 1000 MCG tablet Take 1 tablet (1,000 mcg total) by mouth daily. 30 tablet 2   Dupilumab (DUPIXENT) 300 MG/2ML SOPN Inject 300 mg into the skin every 14 (fourteen) days. Starting at day 15 for maintenance. 4 mL 5   ezetimibe (ZETIA) 10 MG tablet TAKE 1 TABLET BY MOUTH DAILY 90 tablet 1   fexofenadine (ALLEGRA) 180 MG tablet Take 180 mg by mouth daily.     FLUoxetine (PROZAC) 10 MG capsule TAKE 1 CAPSULE BY MOUTH 3 TIMES DAILY. 90 capsule 3   glucose blood test strip ACCU-CHEK AVIVA PLUS TEST STRP Use to check sugars 3 times daily.     HUMALOG KWIKPEN 100  UNIT/ML KwikPen Inject 3 Units into the skin 3 (three) times daily. 15 mL 11   hydrocortisone 2.5 % cream apply twice daily for 2 weeks, apply 2nd 45 g 2   insulin glargine (LANTUS) 100 UNIT/ML injection Inject 0.06 mLs (6 Units total) into the skin at bedtime. 10 mL 11   iron polysaccharides (NIFEREX) 150 MG capsule Take 1 capsule (150 mg total) by mouth daily. 30 capsule 3   isosorbide mononitrate (IMDUR) 30 MG 24 hr tablet Take 1 tablet (30 mg total) by mouth daily. Take  30 mg by mouth daily. 30 tablet 0   ketoconazole (NIZORAL) 2 % cream Apply twice daily for 2 weeks; apply 1st 60 g 2   lansoprazole (PREVACID) 30 MG capsule TAKE 1 CAPSULE BY MOUTH ONCE DAILY AT NOON (Patient taking differently: Take 30 mg by mouth daily at 12 noon.) 90 capsule 1   memantine (NAMENDA) 10 MG tablet Take 10 mg by mouth 2 (two) times daily.     Miconazole-Zinc Oxide-Petrolat (VUSION) 0.25-15-81.35 % OINT Apply twice daily to affected areas 50 g 2   Multiple Vitamins-Minerals (ICAPS AREDS 2 PO) Take 2 capsules by mouth daily at 6 (six) AM.     QUEtiapine (SEROQUEL) 50 MG tablet Take 50 mg by mouth at bedtime.     sacubitril-valsartan (ENTRESTO) 24-26 MG Take 1 tablet by mouth 2 (two) times daily. 180 tablet 3   vitamin E 1000 UNIT capsule Take 1,000 Units by mouth daily.     No current facility-administered medications for this visit.   Vitals:   02/24/23 0828 02/24/23 0846  BP: (!) 177/75 (!) 162/78  Pulse: 69   SpO2: 100%   Weight: 137 lb 6.4 oz (62.3 kg)    Wt Readings from Last 3 Encounters:  02/24/23 137 lb 6.4 oz (62.3 kg)  12/21/22 133 lb 9.6 oz (60.6 kg)  10/19/22 133 lb 8 oz (60.6 kg)   Lab Results  Component Value Date   CREATININE 0.88 12/21/2022   CREATININE 1.33 (H) 08/04/2022   CREATININE 1.19 (H) 08/03/2022   PHYSICAL EXAM:  General:  Well appearing. No resp difficulty HEENT: normal Neck: supple. JVP flat. No lymphadenopathy or thryomegaly appreciated. Cor: PMI normal. Regular  rate & rhythm. No rubs, gallops or murmurs. Lungs: clear Abdomen: soft, nontender, nondistended. No hepatosplenomegaly. No bruits or masses.  Extremities: no cyanosis, clubbing, rash, edema Neuro: alert & oriented x3, cranial nerves grossly intact. Moves all 4 extremities w/o difficulty. Affect pleasant.   ECG: not done   ASSESSMENT & PLAN:  1: Ischemic heart failure with mildly reduced ejection fraction- - likely due to CAD (via CT)  - NYHA class II - euvolemic today - weighing daily with the talking scales; call for an overnight weight gain of > 2 pounds or a weekly weight gain of > 5 pounds - weight up 4 pounds from last visit here 2 months ago - Echo 08/02/22: EF of 45-50% along with mild LVH/ LAE and moderate MR.  - Echo 06/15/22: EF of 45-50% along with mild LVH/ LAE and mild MR.  - not adding salt to her food and DIL doesn't cook with salt; does eat micrwaveable breakfast but she still doesn't go over 2000mg  - continue carvedilol 3.125mg  BID - increase entresto to 49/51mg  BID - BMP next visit - daughter is going to check her pill packs and compare it to our med list to confirm accuracy and let us know of anything that's incorrect (we don't have furosemide listed but her [facility] providers do) - walking with her walker  - BNP 07/30/22 was 1821.3 - PharmD reconciled meds w/ patient  2: HTN- - BP 177/75 initially and 162/78 when rechecked with manual cuff - increasing entresto per above - saw PCP Sullivan Lone) 04/24 - BMP 02/15/23 reviewed and showed sodium 136, potassium 4.4, creatinine 0.9 and GFR 60  3: DM with CKD- - saw endocrinology ([facility]) 05/24 - A1c 02/15/23 was 7.6%  4: Anemia- - saw hematology Smith Robert) 12/23 - hemoglobin 02/10/23 was 14.5 - had iron infusion 07/06/22  5: CAD- - saw cardiology Juliann Pares) 04/24 - continue ASA 81mg  daily - continue atorvastatin 10mg  daily - continue clopidogrel 75mg  daily - continue ezetimibe 10mg  daily  6: Constipation- -  may be due to iron supplementation - using docusate daily - can try miralax and if no improvement, discuss further with PCP  Return in 1 month, sooner if needed.

## 2023-02-24 NOTE — Progress Notes (Signed)
[facility] HEART FAILURE CLINIC - Pharmacist Note  [name] is a 89 y.o. female with HFmrEF (EF 41-49%) presenting to the Heart Failure Clinic for follow up. Patient is here today with her daughter who helps with medication management. Per patient's daughter, her medications are packaged by [facility]. They report no medication access issues and no issues on current regimen. She reports that she checks her weight every morning and that her weight is stable. She reports no signs or symptoms of volume overload.   Recent ED Visit (past 6 months): none  Guideline-Directed Medical Therapy/Evidence Based Medicine ACE/ARB/ARNI: Sacubitril/valsartan 24/26 mg twice daily Beta Blocker: Carvedilol 3.125 mg twice daily Aldosterone Antagonist: none Diuretic:  none SGLT2i:  none  Adherence Assessment Do you ever forget to take your medication? [] Yes [x] No  Do you ever skip doses due to side effects? [] Yes [x] No  Do you have trouble affording your medicines? [] Yes [x] No  Are you ever unable to pick up your medication due to transportation difficulties? [] Yes [x] No  Do you ever stop taking your medications because you don't believe they are helping? [] Yes [x] No  Do you check your weight daily? [x] Yes [] No  Adherence strategy: pills packaged by [facility], daughter helps Barriers to obtaining medications: none reported  Diagnostics ECHO: Date 08/02/2022, EF 45-50%, GHK, mild LVH, G2DD  Vitals    12/21/2022    9:29 AM 10/19/2022   10:07 AM 08/31/2022   11:53 AM  Vitals with BMI  Weight 133 lbs 10 oz 133 lbs 8 oz 131 lbs 8 oz  Systolic 152 164 161  Diastolic 80 71 83  Pulse 79 85 81     Recent Labs    Latest Ref Rng & Units 12/21/2022   11:00 AM 08/04/2022    5:34 AM 08/03/2022    3:39 AM  BMP  Glucose 70 - 99 mg/dL 46  096  045   BUN 8 - 23 mg/dL 19  45  39   Creatinine 0.44 - 1.00 mg/dL 4.09  8.11  9.14   Sodium 135 - 145 mmol/L 138  129  132   Potassium 3.5 - 5.1 mmol/L 4.1   3.7  4.0   Chloride 98 - 111 mmol/L 97  93  92   CO2 22 - 32 mmol/L 27  27  29    Calcium 8.9 - 10.3 mg/dL 9.2  8.5  8.5     Past Medical History Past Medical History:  Diagnosis Date   Anemia    Angina pectoris (HCC)    Atrial fibrillation (HCC)    Basal cell carcinoma 09/30/2022   dorsum nose - EDC   Cervicalgia    CHF (congestive heart failure) (HCC)    Chronic back pain    Chronic kidney disease    Chronic obstructive pulmonary disease (COPD) (HCC)    COPD (chronic obstructive pulmonary disease) (HCC)    DDD (degenerative disc disease), lumbar    Diabetes mellitus without complication (HCC)    Dysphagia    Hyperlipemia    Hypertension    Squamous cell carcinoma of skin    left lateral pretibial   Vulvovaginitis     Plan Continue regimen as directed by NP Increase Entresto to 49/51 mg twice daily Consider addition of SGLT2 inhibitor (Farxiga and Jardiance copay $0) or MRA as able per BP and renal function Annual echo due 07/2023  Time spent: 10 minutes  Celene Squibb, PharmD PGY1 Pharmacy Resident 02/24/2023 8:28 AM

## 2023-02-24 NOTE — Patient Instructions (Signed)
Increase your entresto to 49/51mg  twice daily

## 2023-03-01 ENCOUNTER — Encounter: Payer: Self-pay | Admitting: Oncology

## 2023-03-01 ENCOUNTER — Other Ambulatory Visit: Payer: Self-pay | Admitting: *Deleted

## 2023-03-01 ENCOUNTER — Inpatient Hospital Stay: Payer: Medicare Other | Attending: Oncology | Admitting: Oncology

## 2023-03-01 ENCOUNTER — Inpatient Hospital Stay: Payer: Medicare Other

## 2023-03-01 VITALS — BP 137/62 | HR 66 | Temp 95.6°F | Resp 18 | Ht 62.0 in | Wt 134.8 lb

## 2023-03-01 DIAGNOSIS — D509 Iron deficiency anemia, unspecified: Secondary | ICD-10-CM | POA: Diagnosis not present

## 2023-03-01 DIAGNOSIS — E538 Deficiency of other specified B group vitamins: Secondary | ICD-10-CM | POA: Diagnosis not present

## 2023-03-01 NOTE — Progress Notes (Signed)
Hematology/Oncology Consult note [facility]  [phone] [phone]  Patient Care Team: Debera Lat, PA-C as PCP - General (Physician Assistant) Raj Janus, MD as Physician Assistant (Endocrinology) Sallee Lange, MD as Consulting Physician (Ophthalmology) Wyn Quaker Marlow Baars, MD as Referring Physician (Vascular Surgery) Lamar Blinks, MD as Consulting Physician (Cardiology) Deirdre Evener, MD as Consulting Physician (Dermatology) Helane Gunther, DPM as Consulting Physician (Podiatry)   Name of the patient: [name]  [id number]  Aug 19, 1929   Date of visit: 03/01/23  Diagnosis-iron deficiency anemia  Chief complaint/ Reason for visit-routine follow-up of iron deficiency anemia  Heme/Onc history:  patient is a 89 year old female with a past medical history significant for type 2 diabetes hypertension hyperlipidemia CHF who has been referred forFor anemia.  Most recent labs from 06/17/2022 showed H&H of 7.9/25.5 with an MCV of 72.9.  Platelets and white count was normal.  B12 level was low at 249.  Iron studies showed low iron saturation of 4% and low serum iron of 15.  Ferritin was not checked.  Looking back at her prior CBCs patient's hemoglobin was around 10 up until May 2023.   Interval history-patient is doing well for her age.  Denies any blood loss in her stool or urine.  Denies any dark melanotic stools  ECOG PS- 2 Pain scale- 0   Review of systems- Review of Systems  Constitutional:  Negative for chills, fever, malaise/fatigue and weight loss.  HENT:  Negative for congestion, ear discharge and nosebleeds.   Eyes:  Negative for blurred vision.  Respiratory:  Negative for cough, hemoptysis, sputum production, shortness of breath and wheezing.   Cardiovascular:  Negative for chest pain, palpitations, orthopnea and claudication.  Gastrointestinal:  Negative for abdominal pain, blood in stool, constipation,  diarrhea, heartburn, melena, nausea and vomiting.  Genitourinary:  Negative for dysuria, flank pain, frequency, hematuria and urgency.  Musculoskeletal:  Negative for back pain, joint pain and myalgias.  Skin:  Negative for rash.  Neurological:  Negative for dizziness, tingling, focal weakness, seizures, weakness and headaches.  Endo/Heme/Allergies:  Does not bruise/bleed easily.  Psychiatric/Behavioral:  Negative for depression and suicidal ideas. The patient does not have insomnia.       Allergies  Allergen Reactions   Bacitracin-Neomycin-Polymyxin Rash   Neomycin-Bacitracin Zn-Polymyx Swelling and Rash   Latex Rash   Lidocaine Rash   Aricept [Donepezil Hcl] Diarrhea   Benzalkonium Chloride Itching and Swelling   Ibuprofen     Other reaction(s): Dizziness Heart fluttering. Tachycardia. Tachycardia.   Lidocaine Hcl Itching    Per pt, "all caine meds cause severe itching"   Valdecoxib Nausea And Vomiting   Albuterol Rash   Tape Rash    blisters   Triamcinolone Rash     Past Medical History:  Diagnosis Date   Anemia    Angina pectoris (HCC)    Atrial fibrillation (HCC)    Basal cell carcinoma 09/30/2022   dorsum nose - [facility]   Cervicalgia    CHF (congestive heart failure) (HCC)    Chronic back pain    Chronic kidney disease    Chronic obstructive pulmonary disease (COPD) (HCC)    COPD (chronic obstructive pulmonary disease) (HCC)    DDD (degenerative disc disease), lumbar    Diabetes mellitus without complication (HCC)    Dysphagia    Hyperlipemia    Hypertension    Squamous cell carcinoma of skin    left lateral pretibial   Vulvovaginitis  Past Surgical History:  Procedure Laterality Date   gall bladder     melanoma     removal neck and back   PERIPHERAL VASCULAR CATHETERIZATION Left 01/05/2016   Procedure: Lower Extremity Angiography;  Surgeon: Annice Needy, MD;  Location: [facility] INVASIVE CV LAB;  Service: Cardiovascular;  Laterality: Left;   PERIPHERAL  VASCULAR CATHETERIZATION  01/05/2016   Procedure: Lower Extremity Intervention;  Surgeon: Annice Needy, MD;  Location: [facility] INVASIVE CV LAB;  Service: Cardiovascular;;   ureterolithiasis     calculus removed   VAGINAL HYSTERECTOMY     VISCERAL ANGIOGRAPHY N/A 05/10/2019   Procedure: VISCERAL ANGIOGRAPHY;  Surgeon: Annice Needy, MD;  Location: [facility] INVASIVE CV LAB;  Service: Cardiovascular;  Laterality: N/A;   VULVA / PERINEUM BIOPSY  05/29/2015    Social History   Socioeconomic History   Marital status: Widowed    Spouse name: Not on file   Number of children: 3   Years of education: Not on file   Highest education level: 8th grade  Occupational History   Occupation: retired  Tobacco Use   Smoking status: Never   Smokeless tobacco: Never  Vaping Use   Vaping Use: Never used  Substance and Sexual Activity   Alcohol use: No   Drug use: No   Sexual activity: Never  Other Topics Concern   Not on file  Social History Narrative   Not on file   Social Determinants of Health   Financial Resource Strain: Low Risk  (05/20/2022)   Overall Financial Resource Strain (CARDIA)    Difficulty of Paying Living Expenses: Not hard at all  Food Insecurity: No Food Insecurity (11/08/2022)   Hunger Vital Sign    Worried About Running Out of Food in the Last Year: Never true    Ran Out of Food in the Last Year: Never true  Transportation Needs: No Transportation Needs (11/08/2022)   PRAPARE - Administrator, Civil Service (Medical): No    Lack of Transportation (Non-Medical): No  Physical Activity: Insufficiently Active (05/20/2022)   Exercise Vital Sign    Days of Exercise per Week: 2 days    Minutes of Exercise per Session: 20 min  Stress: No Stress Concern Present (05/20/2022)   [employer] of Occupational Health - Occupational Stress Questionnaire    Feeling of Stress : Not at all  Social Connections: Moderately Isolated (05/20/2022)   Social Connection and Isolation Panel  [NHANES]    Frequency of Communication with Friends and Family: More than three times a week    Frequency of Social Gatherings with Friends and Family: More than three times a week    Attends Religious Services: More than 4 times per year    Active Member of [employer] or Organizations: No    Attends Banker Meetings: Never    Marital Status: Widowed  Intimate Partner Violence: Not At Risk (07/31/2022)   Humiliation, Afraid, Rape, and Kick questionnaire    Fear of Current or Ex-Partner: No    Emotionally Abused: No    Physically Abused: No    Sexually Abused: No    Family History  Problem Relation Age of Onset   Diabetes Sister    Colon cancer Brother    Diabetes Brother    Atrial fibrillation Daughter    Ovarian cancer Other    Breast cancer Neg Hx      Current Outpatient Medications:    acetaminophen (TYLENOL) 325 MG tablet, Take 2 tablets (650  mg total) by mouth every 6 (six) hours as needed for mild pain (or Fever >/= 101)., Disp: , Rfl:    albuterol (VENTOLIN HFA) 108 (90 Base) MCG/ACT inhaler, INHALE 2 PUFFS INTO THE LUNGS EVERY 4 HOURS AS NEEDED FOR WHEEZING ORSHORTNESS OF BREATH, Disp: 8.5 g, Rfl: 10   aspirin EC 81 MG tablet, Take 1 tablet (81 mg total) by mouth daily. Swallow whole., Disp: 30 tablet, Rfl: 0   atorvastatin (LIPITOR) 10 MG tablet, TAKE 1 TABLET BY MOUTH DAILY, Disp: 90 tablet, Rfl: 0   carvedilol (COREG) 3.125 MG tablet, Take 1 tablet (3.125 mg total) by mouth 2 (two) times daily., Disp: 180 tablet, Rfl: 3   clopidogrel (PLAVIX) 75 MG tablet, Take 1 tablet (75 mg total) by mouth daily., Disp: 30 tablet, Rfl: 3   cyanocobalamin 1000 MCG tablet, Take 1 tablet (1,000 mcg total) by mouth daily., Disp: 30 tablet, Rfl: 2   Dupilumab (DUPIXENT) 300 MG/2ML SOPN, Inject 300 mg into the skin every 14 (fourteen) days. Starting at day 15 for maintenance., Disp: 4 mL, Rfl: 5   ezetimibe (ZETIA) 10 MG tablet, TAKE 1 TABLET BY MOUTH DAILY, Disp: 90 tablet, Rfl: 1    fexofenadine (ALLEGRA) 180 MG tablet, Take 180 mg by mouth daily., Disp: , Rfl:    FLUoxetine (PROZAC) 10 MG capsule, TAKE 1 CAPSULE BY MOUTH 3 TIMES DAILY., Disp: 90 capsule, Rfl: 3   glucose blood test strip, ACCU-CHEK AVIVA PLUS TEST STRP Use to check sugars 3 times daily., Disp: , Rfl:    HUMALOG KWIKPEN 100 UNIT/ML KwikPen, Inject 3 Units into the skin 3 (three) times daily. (Patient taking differently: Inject 12-15 Units into the skin 3 (three) times daily. 12 units with breakfast, 15 units with lunch, and 15 units with dinner), Disp: 15 mL, Rfl: 11   hydrocortisone 2.5 % cream, apply twice daily for 2 weeks, apply 2nd, Disp: 45 g, Rfl: 2   insulin glargine (LANTUS) 100 UNIT/ML injection, Inject 0.06 mLs (6 Units total) into the skin at bedtime. (Patient taking differently: Inject 12 Units into the skin at bedtime.), Disp: 10 mL, Rfl: 11   iron polysaccharides (NIFEREX) 150 MG capsule, Take 1 capsule (150 mg total) by mouth daily., Disp: 30 capsule, Rfl: 3   isosorbide mononitrate (IMDUR) 30 MG 24 hr tablet, Take 1 tablet (30 mg total) by mouth daily. Take 30 mg by mouth daily., Disp: 30 tablet, Rfl: 0   ketoconazole (NIZORAL) 2 % cream, Apply twice daily for 2 weeks; apply 1st, Disp: 60 g, Rfl: 2   lansoprazole (PREVACID) 30 MG capsule, TAKE 1 CAPSULE BY MOUTH ONCE DAILY AT NOON (Patient taking differently: Take 30 mg by mouth daily at 12 noon.), Disp: 90 capsule, Rfl: 1   memantine (NAMENDA) 10 MG tablet, Take 10 mg by mouth 2 (two) times daily., Disp: , Rfl:    Miconazole-Zinc Oxide-Petrolat (VUSION) 0.25-15-81.35 % OINT, Apply twice daily to affected areas, Disp: 50 g, Rfl: 2   Multiple Vitamins-Minerals (ICAPS AREDS 2 PO), Take 2 capsules by mouth daily at 6 (six) AM., Disp: , Rfl:    QUEtiapine (SEROQUEL) 50 MG tablet, Take 50 mg by mouth at bedtime., Disp: , Rfl:    sacubitril-valsartan (ENTRESTO) 49-51 MG, Take 1 tablet by mouth 2 (two) times daily., Disp: 60 tablet, Rfl: 5   vitamin E  1000 UNIT capsule, Take 1,000 Units by mouth daily., Disp: , Rfl:   Physical exam:  Vitals:   03/01/23 0844  BP: 137/62  Pulse: 66  Resp: 18  Temp: (!) 95.6 F (35.3 C)  TempSrc: Tympanic  SpO2: 100%  Weight: 134 lb 12.8 oz (61.1 kg)  Height: 5\' 2"  (1.575 m)   Physical Exam Cardiovascular:     Rate and Rhythm: Normal rate and regular rhythm.     Heart sounds: Normal heart sounds.  Pulmonary:     Effort: Pulmonary effort is normal.     Breath sounds: Normal breath sounds.  Skin:    General: Skin is warm and dry.  Neurological:     Mental Status: She is alert and oriented to person, place, and time.         Latest Ref Rng & Units 12/21/2022   11:00 AM  CMP  Glucose 70 - 99 mg/dL 46   BUN 8 - 23 mg/dL 19   Creatinine 1.61 - 1.00 mg/dL 0.96   Sodium 045 - 409 mmol/L 138   Potassium 3.5 - 5.1 mmol/L 4.1   Chloride 98 - 111 mmol/L 97   CO2 22 - 32 mmol/L 27   Calcium 8.9 - 10.3 mg/dL 9.2       Latest Ref Rng & Units 11/30/2022    9:17 AM  CBC  WBC 4.0 - 10.5 K/uL 9.7   Hemoglobin 12.0 - 15.0 g/dL 81.1   Hematocrit 91.4 - 46.0 % 42.4   Platelets 150 - 400 K/uL 258      Assessment and plan- Patient is a 89 y.o. female here for routine follow-up of iron and B12 deficiency anemia  Patient's hemoglobin is stable around 14 as per recent labs checked with PCPs office.  Iron studies were not checked however iron studies from March 2024 and B12 levels were normal.  I am not repeating her labs today.  Given the stability of her hemoglobin I will repeat CBC ferritin and iron studies in 4 and 8 months and see her back in 8 months   Visit Diagnosis 1. Iron deficiency anemia, unspecified iron deficiency anemia type   2. B12 deficiency      Dr. Owens Shark, MD, MPH [facility] [phone] 03/01/2023 9:14 AM

## 2023-03-02 ENCOUNTER — Other Ambulatory Visit: Payer: Self-pay

## 2023-03-02 DIAGNOSIS — L209 Atopic dermatitis, unspecified: Secondary | ICD-10-CM

## 2023-03-02 MED ORDER — DUPIXENT 300 MG/2ML ~~LOC~~ SOAJ
300.0000 mg | SUBCUTANEOUS | 5 refills | Status: DC
Start: 2023-03-02 — End: 2023-08-01

## 2023-03-02 NOTE — Progress Notes (Signed)
Received faxed for refill request. Send electronically instead.

## 2023-03-09 ENCOUNTER — Ambulatory Visit: Payer: Medicare Other | Admitting: Dermatology

## 2023-03-09 VITALS — BP 147/81 | HR 74

## 2023-03-09 DIAGNOSIS — L24A2 Irritant contact dermatitis due to fecal, urinary or dual incontinence: Secondary | ICD-10-CM | POA: Diagnosis not present

## 2023-03-09 MED ORDER — HYDROCORTISONE 2.5 % EX CREA: TOPICAL_CREAM | Freq: Two times a day (BID) | CUTANEOUS | 1 refills | Status: DC | PRN

## 2023-03-09 MED ORDER — KETOCONAZOLE 2 % EX CREA: TOPICAL_CREAM | CUTANEOUS | 5 refills | Status: DC

## 2023-03-09 NOTE — Patient Instructions (Addendum)
Mix equal amounts of hydrocortisone 2.5% cream with ketaconazole 2% cream and apply to affected areas twice a day.  If improved, decrease to hydrocortisone and ketaconazole mixed once a day. If still clear, decrease to ketaconazole cream only, once daily to help prevent flares.  Do not use the hydrocortisone cream for maintenance, since long term use of a topical steroid can thin the skin.  May repeat regimen as needed for flares.  Continue Zinc Oxide Cream/Paste daily as a barrier.   Due to recent changes in healthcare laws, you may see results of your pathology and/or laboratory studies on MyChart before the doctors have had a chance to review them. We understand that in some cases there may be results that are confusing or concerning to you. Please understand that not all results are received at the same time and often the doctors may need to interpret multiple results in order to provide you with the best plan of care or course of treatment. Therefore, we ask that you please give [place] 2 business days to thoroughly review all your results before contacting the office for clarification. Should we see a critical lab result, you will be contacted sooner.   If You Need Anything After Your Visit  If you have any questions or concerns for your doctor, please call our main line at [phone] and press option 4 to reach your doctor's medical assistant. If no one answers, please leave a voicemail as directed and we will return your call as soon as possible. Messages left after 4 pm will be answered the following business day.   You may also send [place] a message via MyChart. We typically respond to MyChart messages within 1-2 business days.  For prescription refills, please ask your pharmacy to contact our office. Our fax number is [phone].  If you have an urgent issue when the clinic is closed that cannot wait until the next business day, you can page your doctor at the number below.    Please note that  while we do our best to be available for urgent issues outside of office hours, we are not available 24/7.   If you have an urgent issue and are unable to reach [place], you may choose to seek medical care at your doctor's office, retail clinic, urgent care center, or emergency room.  If you have a medical emergency, please immediately call 911 or go to the emergency department.  Pager Numbers  - Dr. Gwen Pounds: [phone]  - Dr. Neale Burly: [phone]  - Dr. Roseanne Reno: [phone]  In the event of inclement weather, please call our main line at [phone] for an update on the status of any delays or closures.  Dermatology Medication Tips: Please keep the boxes that topical medications come in in order to help keep track of the instructions about where and how to use these. Pharmacies typically print the medication instructions only on the boxes and not directly on the medication tubes.   If your medication is too expensive, please contact our office at [phone] option 4 or send [place] a message through MyChart.   We are unable to tell what your co-pay for medications will be in advance as this is different depending on your insurance coverage. However, we may be able to find a substitute medication at lower cost or fill out paperwork to get insurance to cover a needed medication.   If a prior authorization is required to get your medication covered by your insurance company, please allow [place] 1-2 business  days to complete this process.  Drug prices often vary depending on where the prescription is filled and some pharmacies may offer cheaper prices.  The website www.goodrx.com contains coupons for medications through different pharmacies. The prices here do not account for what the cost may be with help from insurance (it may be cheaper with your insurance), but the website can give you the price if you did not use any insurance.  - You can print the associated coupon and take it with your  prescription to the pharmacy.  - You may also stop by our office during regular business hours and pick up a GoodRx coupon card.  - If you need your prescription sent electronically to a different pharmacy, notify our office through [facility] or by phone at [phone] option 4.     Si Usted Necesita Algo Despus de Su Visita  Tambin puede enviarnos un mensaje a travs de Clinical cytogeneticist. Por lo general respondemos a los mensajes de MyChart en el transcurso de 1 a 2 das hbiles.  Para renovar recetas, por favor pida a su farmacia que se ponga en contacto con nuestra oficina. Annie Sable de fax es [place] [phone].  Si tiene un asunto urgente cuando la clnica est cerrada y que no puede esperar hasta el siguiente da hbil, puede llamar/localizar a su doctor(a) al nmero que aparece a continuacin.   Por favor, tenga en cuenta que aunque hacemos todo lo posible para estar disponibles para asuntos urgentes fuera del horario de [place], no estamos disponibles las 24 horas del da, los 7 [address]  [address] de la [place].   Si tiene un problema urgente y no puede comunicarse con nosotros, puede optar por buscar atencin mdica  en el consultorio de su doctor(a), en una clnica privada, en un centro de atencin urgente o en una sala de emergencias.  Si tiene Engineer, drilling, por favor llame inmediatamente al 911 o vaya a la sala de emergencias.  Nmeros de bper  - Dr. Gwen Pounds: [phone]  - Dra. Moye: [phone]  - Dra. Roseanne Reno: [phone]  En caso de inclemencias del [place], por favor llame a Lacy Duverney principal al [phone] para una actualizacin sobre el [place] de cualquier retraso o cierre.  Consejos para la medicacin en dermatologa: Por favor, guarde las cajas en las que vienen los medicamentos de uso tpico para ayudarle a seguir las instrucciones sobre dnde y cmo usarlos. Las farmacias generalmente imprimen las instrucciones del medicamento slo en las cajas y no  directamente en los tubos del [place].   Si su medicamento es muy caro, por favor, pngase en contacto con Rolm Gala llamando al [phone] y presione la opcin 4 o envenos un mensaje a travs de Clinical cytogeneticist.   No podemos decirle cul ser su copago por los medicamentos por adelantado ya que esto es diferente dependiendo de la cobertura de su seguro. Sin embargo, es posible que podamos encontrar un medicamento sustituto a Audiological scientist un formulario para que el seguro cubra el medicamento que se considera necesario.   Si se requiere una autorizacin previa para que su compaa de seguros [place] su medicamento, por favor permtanos de 1 a 2 das hbiles para completar [address]  Los precios de los medicamentos varan con frecuencia dependiendo del Environmental consultant de dnde se surte la receta y alguna farmacias pueden ofrecer precios ms baratos.  El sitio web www.goodrx.com tiene cupones para medicamentos de Health and safety inspector. Los precios aqu no tienen en cuenta lo que podra costar con la  ayuda del seguro (puede ser ms barato con su seguro), pero el sitio web puede darle el precio si no Field seismologist.  - Puede imprimir el cupn correspondiente y llevarlo con su receta a la farmacia.  - Tambin puede pasar por nuestra oficina durante el horario de atencin regular y Charity fundraiser una tarjeta de cupones de GoodRx.  - Si necesita que su receta se enve electrnicamente a una farmacia diferente, informe a nuestra oficina a travs de MyChart de [place] o por telfono llamando al [phone] y presione la opcin 4.

## 2023-03-09 NOTE — Progress Notes (Signed)
   Follow-Up Visit   Subjective  [name] is a 89 y.o. female who presents for the following: irritant contact dermatitis due to urinary incontinence. Rash had almost completely cleared, but patient ran out of ketoconazole 2% cream and hydrocortisone 2.5% ointment. She is currently using zinc oxide cream.   Patient accompanied by daughter who contributes to history.  The patient has spots, moles and lesions to be evaluated, some may be new or changing and the patient has concerns that these could be cancer.    The following portions of the chart were reviewed this encounter and updated as appropriate: medications, allergies, medical history  Review of Systems:  No other skin or systemic complaints except as noted in HPI or Assessment and Plan.  Objective  Well appearing patient in no apparent distress; mood and affect are within normal limits.  A focused examination was performed of the following areas: Face, groin, legs  Relevant physical exam findings are noted in the Assessment and Plan.    Assessment & Plan   Irritant Contact Dermatitis due to urinary incontinence Exam: Erythema with scale and excoriations of the perineal area- medial buttocks to medial thighs   Chronic and persistent condition with duration or expected duration over one year. Condition is symptomatic/ bothersome to patient. Not currently at goal.   Treatment Plan: Continue Ketoconazole 2% cream apply twice daily as directed; apply 1st Continue Hydrocortisone 2.5% ointment twice daily as directed, apply 2nd Sent in larger amounts of both. Continue thick layer of Zinc oxide paste/cream daily as a skin protecting barrie, apply 3rd   Mix equal amounts of hydrocortisone 2.5% cream with ketaconazole 2% cream and apply to affected areas twice a day.  If improved, decrease to hydrocortisone and ketaconazole mixed once a day. If still clear, decrease to ketaconazole cream only, once daily to help prevent  flares.  Do not use the hydrocortisone cream for maintenance, since long term use of a topical steroid can thin the skin.  May repeat regimen as needed for flares.   Avoid using wipes in these areas. Avoid scrubbing. Use soap and water only. Sample of [facility] Skin Body Wash.    Change pads or pull ups as soon as they are wet.   Discuss bladder control medication with PCP or urologist. Patient has appointment.    Return if symptoms worsen or fail to improve.  [name], CMA, am acting as scribe for Willeen Niece, MD .   Documentation: I have reviewed the above documentation for accuracy and completeness, and I agree with the above.  Willeen Niece, MD

## 2023-03-22 NOTE — Progress Notes (Deleted)
PCP: Julieanne Manson, MD (last seen 04/24) Primary Cardiologist: Dorothyann Peng, MD (last seen 04/24; returns 08/24)  HPI:  [name] is a 89 y/o female with a history of DM, hyperlipidemia, HTN, CKD, CAD by CT, aortic atherosclerosis, anemia, PAF, COPD, dysphagia and chronic heart failure.   Echo 08/02/22: EF of 45-50% along with mild LVH/ LAE and moderate MR. Echo 06/15/22: EF of 45-50% along with mild LVH/ LAE and mild MR.   Admitted 07/30/22 due to acute on chronic heart failure. BNP elevated. Initially given IV lasix with transition to oral diuretics. Negative for DVT. Spironolactone held due to rising creatinine. PT/OT evaluation done. Discharged after 5 days.   She presents today for a HF follow-up visit with a chief complaint   At last visit, entresto was increased to 49/51mg  BID  ROS: All systems negative except as listed in HPI, PMH and Problem List.  SH:  Social History   Socioeconomic History   Marital status: Widowed    Spouse name: Not on file   Number of children: 3   Years of education: Not on file   Highest education level: 8th grade  Occupational History   Occupation: retired  Tobacco Use   Smoking status: Never   Smokeless tobacco: Never  Vaping Use   Vaping Use: Never used  Substance and Sexual Activity   Alcohol use: No   Drug use: No   Sexual activity: Never  Other Topics Concern   Not on file  Social History Narrative   Not on file   Social Determinants of Health   Financial Resource Strain: Low Risk  (05/20/2022)   Overall Financial Resource Strain (CARDIA)    Difficulty of Paying Living Expenses: Not hard at all  Food Insecurity: No Food Insecurity (11/08/2022)   Hunger Vital Sign    Worried About Running Out of Food in the Last Year: Never true    Ran Out of Food in the Last Year: Never true  Transportation Needs: No Transportation Needs (11/08/2022)   PRAPARE - Administrator, Civil Service (Medical): No    Lack of  Transportation (Non-Medical): No  Physical Activity: Insufficiently Active (05/20/2022)   Exercise Vital Sign    Days of Exercise per Week: 2 days    Minutes of Exercise per Session: 20 min  Stress: No Stress Concern Present (05/20/2022)   [employer] of Occupational Health - Occupational Stress Questionnaire    Feeling of Stress : Not at all  Social Connections: Moderately Isolated (05/20/2022)   Social Connection and Isolation Panel [NHANES]    Frequency of Communication with Friends and Family: More than three times a week    Frequency of Social Gatherings with Friends and Family: More than three times a week    Attends Religious Services: More than 4 times per year    Active Member of [employer] or Organizations: No    Attends Banker Meetings: Never    Marital Status: Widowed  Intimate Partner Violence: Not At Risk (07/31/2022)   Humiliation, Afraid, Rape, and Kick questionnaire    Fear of Current or Ex-Partner: No    Emotionally Abused: No    Physically Abused: No    Sexually Abused: No    FH:  Family History  Problem Relation Age of Onset   Diabetes Sister    Colon cancer Brother    Diabetes Brother    Atrial fibrillation Daughter    Ovarian cancer Other    Breast cancer Neg Hx  Past Medical History:  Diagnosis Date   Anemia    Angina pectoris (HCC)    Atrial fibrillation (HCC)    Basal cell carcinoma 09/30/2022   dorsum nose - ED[facility]   Cervicalgia    CHF (congestive heart failure) (HCC)    Chronic back pain    Chronic kidney disease    Chronic obstructive pulmonary disease (COPD) (HCC)    COPD (chronic obstructive pulmonary disease) (HCC)    DDD (degenerative disc disease), lumbar    Diabetes mellitus without complication (HCC)    Dysphagia    Hyperlipemia    Hypertension    Squamous cell carcinoma of skin    left lateral pretibial   Vulvovaginitis     Current Outpatient Medications  Medication Sig Dispense Refill   acetaminophen (TYLENOL)  325 MG tablet Take 2 tablets (650 mg total) by mouth every 6 (six) hours as needed for mild pain (or Fever >/= 101).     albuterol (VENTOLIN HFA) 108 (90 Base) MCG/ACT inhaler INHALE 2 PUFFS INTO THE LUNGS EVERY 4 HOURS AS NEEDED FOR WHEEZING ORSHORTNESS OF BREATH 8.5 g 10   aspirin EC 81 MG tablet Take 1 tablet (81 mg total) by mouth daily. Swallow whole. 30 tablet 0   atorvastatin (LIPITOR) 10 MG tablet TAKE 1 TABLET BY MOUTH DAILY 90 tablet 0   carvedilol (COREG) 3.125 MG tablet Take 1 tablet (3.125 mg total) by mouth 2 (two) times daily. 180 tablet 3   clopidogrel (PLAVIX) 75 MG tablet Take 1 tablet (75 mg total) by mouth daily. 30 tablet 3   cyanocobalamin 1000 MCG tablet Take 1 tablet (1,000 mcg total) by mouth daily. 30 tablet 2   Dupilumab (DUPIXENT) 300 MG/2ML SOPN Inject 300 mg into the skin every 14 (fourteen) days. Starting at day 15 for maintenance. 4 mL 5   ezetimibe (ZETIA) 10 MG tablet TAKE 1 TABLET BY MOUTH DAILY 90 tablet 1   fexofenadine (ALLEGRA) 180 MG tablet Take 180 mg by mouth daily.     FLUoxetine (PROZAC) 10 MG capsule TAKE 1 CAPSULE BY MOUTH 3 TIMES DAILY. 90 capsule 3   glucose blood test strip ACCU-CHEK AVIVA PLUS TEST STRP Use to check sugars 3 times daily.     HUMALOG KWIKPEN 100 UNIT/ML KwikPen Inject 3 Units into the skin 3 (three) times daily. (Patient taking differently: Inject 12-15 Units into the skin 3 (three) times daily. 12 units with breakfast, 15 units with lunch, and 15 units with dinner) 15 mL 11   hydrocortisone 2.5 % cream apply twice daily for 2 weeks, apply 2nd 45 g 2   hydrocortisone 2.5 % cream Apply topically 2 (two) times daily as needed (Rash). 453.6 g 1   insulin glargine (LANTUS) 100 UNIT/ML injection Inject 0.06 mLs (6 Units total) into the skin at bedtime. (Patient taking differently: Inject 12 Units into the skin at bedtime.) 10 mL 11   iron polysaccharides (NIFEREX) 150 MG capsule Take 1 capsule (150 mg total) by mouth daily. 30 capsule 3    isosorbide mononitrate (IMDUR) 30 MG 24 hr tablet Take 1 tablet (30 mg total) by mouth daily. Take 30 mg by mouth daily. 30 tablet 0   ketoconazole (NIZORAL) 2 % cream Apply twice daily for 2 weeks; apply 1st 120 g 5   lansoprazole (PREVACID) 30 MG capsule TAKE 1 CAPSULE BY MOUTH ONCE DAILY AT NOON (Patient taking differently: Take 30 mg by mouth daily at 12 noon.) 90 capsule 1   memantine (NAMENDA) 10  MG tablet Take 10 mg by mouth 2 (two) times daily.     Miconazole-Zinc Oxide-Petrolat (VUSION) 0.25-15-81.35 % OINT Apply twice daily to affected areas 50 g 2   Multiple Vitamins-Minerals (ICAPS AREDS 2 PO) Take 2 capsules by mouth daily at 6 (six) AM.     QUEtiapine (SEROQUEL) 50 MG tablet Take 50 mg by mouth at bedtime.     sacubitril-valsartan (ENTRESTO) 49-51 MG Take 1 tablet by mouth 2 (two) times daily. 60 tablet 5   vitamin E 1000 UNIT capsule Take 1,000 Units by mouth daily.     No current facility-administered medications for this visit.     PHYSICAL EXAM:  General:  Well appearing. No resp difficulty HEENT: normal Neck: supple. JVP flat. No lymphadenopathy or thryomegaly appreciated. Cor: PMI normal. Regular rate & rhythm. No rubs, gallops or murmurs. Lungs: clear Abdomen: soft, nontender, nondistended. No hepatosplenomegaly. No bruits or masses.  Extremities: no cyanosis, clubbing, rash, edema Neuro: alert & oriented x3, cranial nerves grossly intact. Moves all 4 extremities w/o difficulty. Affect pleasant.   ECG: not done   ASSESSMENT & PLAN:  1: Ischemic heart failure with mildly reduced ejection fraction- - likely due to CAD (via CT)  - NYHA class II - euvolemic today - weighing daily with the talking scales; call for an overnight weight gain of > 2 pounds or a weekly weight gain of > 5 pounds - weight 137.4 pounds from last visit here 1 month ago - Echo 08/02/22: EF of 45-50% along with mild LVH/ LAE and moderate MR.  - Echo 06/15/22: EF of 45-50% along with mild LVH/  LAE and mild MR.  - not adding salt to her food and DIL doesn't cook with salt; does eat micrwaveable breakfast but she still doesn't go over 2000mg  - continue carvedilol 3.125mg  BID - continue  entresto to 49/51mg  BID - BMP today - walking with her walker  - BNP 07/30/22 was 1821.3   2: HTN- - BP  - saw PCP [name]) 04/24 - BMP 02/15/23 reviewed and showed sodium 136, potassium 4.4, creatinine 0.9 and GFR 60 - BMP today  3: DM with CKD- - saw endocrinology ([facility]) 05/24 - A1c 02/15/23 was 7.6%  4: Anemia- - saw hematology Smith Robert) 12/23 - hemoglobin 02/10/23 was 14.5 - had iron infusion 07/06/22  5: CAD- - saw cardiology Juliann Pares) 04/24 - continue ASA 81mg  daily - continue atorvastatin 10mg  daily - continue clopidogrel 75mg  daily - continue ezetimibe 10mg  daily  6: Constipation- - may be due to iron supplementation - using docusate daily - can try miralax and if no improvement, discuss further with PCP

## 2023-03-23 ENCOUNTER — Encounter: Payer: Medicare Other | Admitting: Family

## 2023-03-23 ENCOUNTER — Telehealth: Payer: Self-pay | Admitting: Family

## 2023-03-23 NOTE — Telephone Encounter (Signed)
Patient did not show for her [facility] Failure [facility] appointment on 03/23/23

## 2023-05-02 ENCOUNTER — Ambulatory Visit: Payer: Medicare Other | Admitting: Urology

## 2023-05-05 ENCOUNTER — Other Ambulatory Visit: Payer: Self-pay | Admitting: Physician Assistant

## 2023-05-05 DIAGNOSIS — E7800 Pure hypercholesterolemia, unspecified: Secondary | ICD-10-CM

## 2023-05-05 DIAGNOSIS — E1165 Type 2 diabetes mellitus with hyperglycemia: Secondary | ICD-10-CM | POA: Diagnosis not present

## 2023-05-06 ENCOUNTER — Telehealth: Payer: Self-pay | Admitting: Physician Assistant

## 2023-05-06 NOTE — Telephone Encounter (Signed)
Requested medications are due for refill today.  yes  Requested medications are on the active medications list.  yes  Last refill. 01/19/2023 #90 0 rf  Future visit scheduled.   no  Notes to clinic.  Labs are expired.    Requested Prescriptions  Pending Prescriptions Disp Refills   atorvastatin (LIPITOR) 10 MG tablet [Pharmacy Med Name: ATORVASTATIN CALCIUM 10 MG TAB] 90 tablet 0    Sig: TAKE 1 TABLET BY MOUTH DAILY     Cardiovascular:  Antilipid - Statins Failed - 05/05/2023  8:59 AM      Failed - Lipid Panel in normal range within the last 12 months    Cholesterol, Total  Date Value Ref Range Status  05/08/2021 118 100 - 199 mg/dL Final   LDL Cholesterol (Calc)  Date Value Ref Range Status  09/13/2017 104 (H) mg/dL (calc) Final    Comment:    Reference range: <100 . Desirable range <100 mg/dL for primary prevention;   <70 mg/dL for patients with CHD or diabetic patients  with > or = 2 CHD risk factors. Marland Kitchen LDL-C is now calculated using the Martin-Hopkins  calculation, which is a validated novel method providing  better accuracy than the Friedewald equation in the  estimation of LDL-C.  Horald Pollen et al. Lenox Ahr. 4098;119(14): 2061-2068  (http://education.[facility].com/faq/FAQ164)    LDL Chol Calc (NIH)  Date Value Ref Range Status  05/08/2021 41 0 - 99 mg/dL Final   HDL  Date Value Ref Range Status  05/08/2021 44 >39 mg/dL Final   Triglycerides  Date Value Ref Range Status  05/08/2021 209 (H) 0 - 149 mg/dL Final         Passed - Patient is not pregnant      Passed - Valid encounter within last 12 months    Recent Outpatient Visits           8 months ago Hyponatremia   [place] [facility] [place], [place], PA-C   1 year ago Hypercholesteremia   [place] [facility] Bosie Clos, MD   1 year ago Moderate Lewy body dementia with anxiety [facility])   [place] [facility] Bosie Clos, MD   1  year ago Moderate Lewy body dementia with anxiety [facility])   [place] [facility] Bosie Clos, MD   1 year ago Late onset Alzheimer's dementia without behavioral disturbance [facility])    [facility] Bosie Clos, MD       Future Appointments             In 1 month MacDiarmid, Lorin Picket, MD [facility]

## 2023-05-19 DIAGNOSIS — E119 Type 2 diabetes mellitus without complications: Secondary | ICD-10-CM | POA: Diagnosis not present

## 2023-05-19 DIAGNOSIS — I700 Atherosclerosis of aorta: Secondary | ICD-10-CM | POA: Diagnosis not present

## 2023-05-19 DIAGNOSIS — D508 Other iron deficiency anemias: Secondary | ICD-10-CM | POA: Diagnosis not present

## 2023-05-19 DIAGNOSIS — Z0000 Encounter for general adult medical examination without abnormal findings: Secondary | ICD-10-CM | POA: Diagnosis not present

## 2023-05-19 DIAGNOSIS — F02818 Dementia in other diseases classified elsewhere, unspecified severity, with other behavioral disturbance: Secondary | ICD-10-CM | POA: Diagnosis not present

## 2023-05-19 DIAGNOSIS — I110 Hypertensive heart disease with heart failure: Secondary | ICD-10-CM | POA: Diagnosis not present

## 2023-05-19 DIAGNOSIS — R269 Unspecified abnormalities of gait and mobility: Secondary | ICD-10-CM | POA: Diagnosis not present

## 2023-05-19 DIAGNOSIS — I5022 Chronic systolic (congestive) heart failure: Secondary | ICD-10-CM | POA: Diagnosis not present

## 2023-05-19 DIAGNOSIS — I480 Paroxysmal atrial fibrillation: Secondary | ICD-10-CM | POA: Diagnosis not present

## 2023-05-19 DIAGNOSIS — D509 Iron deficiency anemia, unspecified: Secondary | ICD-10-CM | POA: Diagnosis not present

## 2023-05-19 DIAGNOSIS — E1159 Type 2 diabetes mellitus with other circulatory complications: Secondary | ICD-10-CM | POA: Diagnosis not present

## 2023-05-19 DIAGNOSIS — I10 Essential (primary) hypertension: Secondary | ICD-10-CM | POA: Diagnosis not present

## 2023-05-23 ENCOUNTER — Telehealth: Payer: Self-pay | Admitting: Physician Assistant

## 2023-05-23 NOTE — Telephone Encounter (Signed)
Patient cancelled 05/23/2023 AWV. The cancellation reason was patient is seeing a different provider. Patient is seeing Dr. Julieanne Manson, [facility].

## 2023-06-06 DIAGNOSIS — E7800 Pure hypercholesterolemia, unspecified: Secondary | ICD-10-CM | POA: Diagnosis not present

## 2023-06-06 DIAGNOSIS — I2510 Atherosclerotic heart disease of native coronary artery without angina pectoris: Secondary | ICD-10-CM | POA: Diagnosis not present

## 2023-06-06 DIAGNOSIS — I700 Atherosclerosis of aorta: Secondary | ICD-10-CM | POA: Diagnosis not present

## 2023-06-06 DIAGNOSIS — I10 Essential (primary) hypertension: Secondary | ICD-10-CM | POA: Diagnosis not present

## 2023-06-06 DIAGNOSIS — I480 Paroxysmal atrial fibrillation: Secondary | ICD-10-CM | POA: Diagnosis not present

## 2023-06-06 DIAGNOSIS — I5022 Chronic systolic (congestive) heart failure: Secondary | ICD-10-CM | POA: Diagnosis not present

## 2023-06-06 DIAGNOSIS — E782 Mixed hyperlipidemia: Secondary | ICD-10-CM | POA: Diagnosis not present

## 2023-06-10 ENCOUNTER — Telehealth: Payer: Self-pay

## 2023-06-10 ENCOUNTER — Other Ambulatory Visit: Payer: Self-pay | Admitting: Family

## 2023-06-10 NOTE — Telephone Encounter (Signed)
Spoke with Fayrene Fearing (pts son) regarding f/u appt. Appt scheduled for November.  Entresto refilled per request.

## 2023-06-20 ENCOUNTER — Encounter: Payer: Self-pay | Admitting: Urology

## 2023-06-20 ENCOUNTER — Ambulatory Visit (INDEPENDENT_AMBULATORY_CARE_PROVIDER_SITE_OTHER): Payer: Medicare Other | Admitting: Urology

## 2023-06-20 VITALS — BP 161/71 | HR 73 | Ht 62.0 in | Wt 135.5 lb

## 2023-06-20 DIAGNOSIS — R351 Nocturia: Secondary | ICD-10-CM | POA: Diagnosis not present

## 2023-06-20 DIAGNOSIS — N3941 Urge incontinence: Secondary | ICD-10-CM

## 2023-06-20 DIAGNOSIS — R350 Frequency of micturition: Secondary | ICD-10-CM

## 2023-06-20 MED ORDER — GEMTESA 75 MG PO TABS: 1.0000 | ORAL_TABLET | Freq: Every day | ORAL | 11 refills | Status: AC

## 2023-06-20 NOTE — Patient Instructions (Signed)

## 2023-06-20 NOTE — Progress Notes (Signed)
06/20/2023 1:30 PM   [name] [birth year] [id number]  Referring provider: Bosie Clos, MD [address] [place],  [place] [address]  Chief Complaint  Patient presents with   New Patient (Initial Visit)   Urinary Incontinence    HPI: I was consulted to assess the patient's urinary incontinence.  It appears she primarily has urge incontinence but no stress incontinence or bedwetting.  She wears 3 pads a day that she said is damp.  She is not a strong historian.  She voids every 2 hours during the day and every 3 hours at night.  She uses a walker for balance  She is an insulin-dependent diabetic.  She may have had a bladder suspension with her hysterectomy.  No history of urinary tract infections.  No treatment   PMH: Past Medical History:  Diagnosis Date   Anemia    Angina pectoris (HCC)    Atrial fibrillation (HCC)    Basal cell carcinoma 09/30/2022   dorsum nose - [facility]   Cervicalgia    CHF (congestive heart failure) (HCC)    Chronic back pain    Chronic kidney disease    Chronic obstructive pulmonary disease (COPD) (HCC)    COPD (chronic obstructive pulmonary disease) (HCC)    DDD (degenerative disc disease), lumbar    Diabetes mellitus without complication (HCC)    Dysphagia    Hyperlipemia    Hypertension    Squamous cell carcinoma of skin    left lateral pretibial   Vulvovaginitis     Surgical History: Past Surgical History:  Procedure Laterality Date   gall bladder     melanoma     removal neck and back   PERIPHERAL VASCULAR CATHETERIZATION Left 01/05/2016   Procedure: Lower Extremity Angiography;  Surgeon: Annice Needy, MD;  Location: [facility] INVASIVE CV LAB;  Service: Cardiovascular;  Laterality: Left;   PERIPHERAL VASCULAR CATHETERIZATION  01/05/2016   Procedure: Lower Extremity Intervention;  Surgeon: Annice Needy, MD;  Location: [facility] INVASIVE CV LAB;  Service: Cardiovascular;;   ureterolithiasis     calculus removed   VAGINAL  HYSTERECTOMY     VISCERAL ANGIOGRAPHY N/A 05/10/2019   Procedure: VISCERAL ANGIOGRAPHY;  Surgeon: Annice Needy, MD;  Location: [facility] INVASIVE CV LAB;  Service: Cardiovascular;  Laterality: N/A;   VULVA / PERINEUM BIOPSY  05/29/2015    Home Medications:  Allergies as of 06/20/2023       Reactions   Bacitracin-neomycin-polymyxin Rash   Neomycin-bacitracin Zn-polymyx Swelling, Rash   Latex Rash   Lidocaine Rash   Aricept [donepezil Hcl] Diarrhea   Benzalkonium Chloride Itching, Swelling   Ibuprofen    Other reaction(s): Dizziness Heart fluttering. Tachycardia. Tachycardia.   Lidocaine Hcl Itching   Per pt, "all caine meds cause severe itching"   Valdecoxib Nausea And Vomiting   Albuterol Rash   Tape Rash   blisters   Triamcinolone Rash        Medication List        Accurate as of June 20, 2023  1:30 PM. If you have any questions, ask your nurse or doctor.          acetaminophen 325 MG tablet Commonly known as: TYLENOL Take 2 tablets (650 mg total) by mouth every 6 (six) hours as needed for mild pain (or Fever >/= 101).   albuterol 108 (90 Base) MCG/ACT inhaler Commonly known as: VENTOLIN HFA INHALE 2 PUFFS INTO THE LUNGS EVERY 4 HOURS AS NEEDED FOR WHEEZING ORSHORTNESS OF  BREATH   aspirin EC 81 MG tablet Take 1 tablet (81 mg total) by mouth daily. Swallow whole.   atorvastatin 10 MG tablet Commonly known as: LIPITOR TAKE 1 TABLET BY MOUTH DAILY   carvedilol 3.125 MG tablet Commonly known as: COREG Take 1 tablet (3.125 mg total) by mouth 2 (two) times daily.   clopidogrel 75 MG tablet Commonly known as: PLAVIX Take 1 tablet (75 mg total) by mouth daily.   cyanocobalamin 1000 MCG tablet Take 1 tablet (1,000 mcg total) by mouth daily.   Dupixent 300 MG/2ML Sopn Generic drug: Dupilumab Inject 300 mg into the skin every 14 (fourteen) days. Starting at day 15 for maintenance.   Entresto 49-51 MG Generic drug: sacubitril-valsartan TAKE ONE TABLET BY  MOUTH TWICE DAILY   ezetimibe 10 MG tablet Commonly known as: ZETIA TAKE 1 TABLET BY MOUTH DAILY   fexofenadine 180 MG tablet Commonly known as: ALLEGRA Take 180 mg by mouth daily.   FLUoxetine 10 MG capsule Commonly known as: PROZAC TAKE 1 CAPSULE BY MOUTH 3 TIMES DAILY.   glucose blood test strip ACCU-CHEK AVIVA PLUS TEST STRP Use to check sugars 3 times daily.   HumaLOG KwikPen 100 UNIT/ML KwikPen Generic drug: insulin lispro Inject 3 Units into the skin 3 (three) times daily. What changed:  how much to take additional instructions   hydrocortisone 2.5 % cream apply twice daily for 2 weeks, apply 2nd   hydrocortisone 2.5 % cream Apply topically 2 (two) times daily as needed (Rash).   ICAPS AREDS 2 PO Take 2 capsules by mouth daily at 6 (six) AM.   insulin glargine 100 UNIT/ML injection Commonly known as: Lantus Inject 0.06 mLs (6 Units total) into the skin at bedtime. What changed: how much to take   iron polysaccharides 150 MG capsule Commonly known as: NIFEREX Take 1 capsule (150 mg total) by mouth daily.   isosorbide mononitrate 30 MG 24 hr tablet Commonly known as: IMDUR Take 1 tablet (30 mg total) by mouth daily. Take 30 mg by mouth daily.   ketoconazole 2 % cream Commonly known as: NIZORAL Apply twice daily for 2 weeks; apply 1st   lansoprazole 30 MG capsule Commonly known as: PREVACID TAKE 1 CAPSULE BY MOUTH ONCE DAILY AT NOON What changed: See the new instructions.   memantine 10 MG tablet Commonly known as: NAMENDA Take 10 mg by mouth 2 (two) times daily.   Miconazole-Zinc Oxide-Petrolat 0.25-15-81.35 % Oint Commonly known as: Vusion Apply twice daily to affected areas   QUEtiapine 50 MG tablet Commonly known as: SEROQUEL Take 50 mg by mouth at bedtime.   vitamin E 1000 UNIT capsule Take 1,000 Units by mouth daily.        Allergies:  Allergies  Allergen Reactions   Bacitracin-Neomycin-Polymyxin Rash   Neomycin-Bacitracin  Zn-Polymyx Swelling and Rash   Latex Rash   Lidocaine Rash   Aricept [Donepezil Hcl] Diarrhea   Benzalkonium Chloride Itching and Swelling   Ibuprofen     Other reaction(s): Dizziness Heart fluttering. Tachycardia. Tachycardia.   Lidocaine Hcl Itching    Per pt, "all caine meds cause severe itching"   Valdecoxib Nausea And Vomiting   Albuterol Rash   Tape Rash    blisters   Triamcinolone Rash    Family History: Family History  Problem Relation Age of Onset   Diabetes Sister    Colon cancer Brother    Diabetes Brother    Atrial fibrillation Daughter    Ovarian cancer Other  Breast cancer Neg Hx     Social History:  reports that she has never smoked. She has never been exposed to tobacco smoke. She has never used smokeless tobacco. She reports that she does not drink alcohol and does not use drugs.  ROS:                                        Physical Exam: BP (!) 161/71   Pulse 73   Ht 5\' 2"  (1.575 m)   Wt 61.5 kg   BMI 24.78 kg/m   Constitutional:  Alert and oriented, No acute distress. HEENT: [place] AT, moist mucus membranes.  Trachea midline, no masses.   Laboratory Data: Lab Results  Component Value Date   WBC 9.7 11/30/2022   HGB 14.0 11/30/2022   HCT 42.4 11/30/2022   MCV 92.2 11/30/2022   PLT 258 11/30/2022    Lab Results  Component Value Date   CREATININE 0.88 12/21/2022    No results found for: "PSA"  No results found for: "TESTOSTERONE"  Lab Results  Component Value Date   HGBA1C 6.9 (H) 06/15/2022    Urinalysis    Component Value Date/Time   COLORURINE STRAW (A) 06/23/2022 0246   APPEARANCEUR CLEAR (A) 06/23/2022 0246   APPEARANCEUR Clear 01/23/2015 1444   LABSPEC 1.012 06/23/2022 0246   LABSPEC 1.013 01/23/2015 1444   PHURINE 6.0 06/23/2022 0246   GLUCOSEU NEGATIVE 06/23/2022 0246   GLUCOSEU Negative 01/23/2015 1444   HGBUR NEGATIVE 06/23/2022 0246   BILIRUBINUR NEGATIVE 06/23/2022 0246   BILIRUBINUR  negative 06/20/2019 1429   BILIRUBINUR Negative 01/23/2015 1444   KETONESUR 5 (A) 06/23/2022 0246   PROTEINUR 100 (A) 06/23/2022 0246   UROBILINOGEN 0.2 06/20/2019 1429   NITRITE NEGATIVE 06/23/2022 0246   LEUKOCYTESUR TRACE (A) 06/23/2022 0246   LEUKOCYTESUR Negative 01/23/2015 1444    Pertinent Imaging: Urine.  Chart reviewed.  Assessment & Plan: Has urge incontinence and frequency and nocturia.  Return for pelvic examination and cystoscopy on Gemtesa samples and prescription.  Get urine culture then.  There are no diagnoses linked to this encounter.  No follow-ups on file.  Martina Sinner, MD  [facility] [address] [place], [place] [address] [phone]

## 2023-06-23 DIAGNOSIS — E1122 Type 2 diabetes mellitus with diabetic chronic kidney disease: Secondary | ICD-10-CM | POA: Diagnosis not present

## 2023-06-23 DIAGNOSIS — N1831 Chronic kidney disease, stage 3a: Secondary | ICD-10-CM | POA: Diagnosis not present

## 2023-06-23 DIAGNOSIS — Z794 Long term (current) use of insulin: Secondary | ICD-10-CM | POA: Diagnosis not present

## 2023-06-28 ENCOUNTER — Inpatient Hospital Stay: Payer: Medicare Other | Attending: Oncology

## 2023-06-28 DIAGNOSIS — D509 Iron deficiency anemia, unspecified: Secondary | ICD-10-CM | POA: Insufficient documentation

## 2023-06-28 DIAGNOSIS — E538 Deficiency of other specified B group vitamins: Secondary | ICD-10-CM

## 2023-06-28 LAB — FERRITIN: Ferritin: 144 ng/mL (ref 11–307)

## 2023-06-28 LAB — CBC
HCT: 40.4 % (ref 36.0–46.0)
Hemoglobin: 13.3 g/dL (ref 12.0–15.0)
MCH: 30.1 pg (ref 26.0–34.0)
MCHC: 32.9 g/dL (ref 30.0–36.0)
MCV: 91.4 fL (ref 80.0–100.0)
Platelets: 208 10*3/uL (ref 150–400)
RBC: 4.42 MIL/uL (ref 3.87–5.11)
RDW: 12.6 % (ref 11.5–15.5)
WBC: 8.2 10*3/uL (ref 4.0–10.5)
nRBC: 0 % (ref 0.0–0.2)

## 2023-06-28 LAB — IRON AND TIBC
Iron: 56 ug/dL (ref 28–170)
Saturation Ratios: 21 % (ref 10.4–31.8)
TIBC: 265 ug/dL (ref 250–450)
UIBC: 209 ug/dL

## 2023-06-30 DIAGNOSIS — H3530 Unspecified macular degeneration: Secondary | ICD-10-CM | POA: Diagnosis not present

## 2023-06-30 DIAGNOSIS — R809 Proteinuria, unspecified: Secondary | ICD-10-CM | POA: Diagnosis not present

## 2023-06-30 DIAGNOSIS — N1831 Chronic kidney disease, stage 3a: Secondary | ICD-10-CM | POA: Diagnosis not present

## 2023-06-30 DIAGNOSIS — Z794 Long term (current) use of insulin: Secondary | ICD-10-CM | POA: Diagnosis not present

## 2023-06-30 DIAGNOSIS — E1159 Type 2 diabetes mellitus with other circulatory complications: Secondary | ICD-10-CM | POA: Diagnosis not present

## 2023-06-30 DIAGNOSIS — E1122 Type 2 diabetes mellitus with diabetic chronic kidney disease: Secondary | ICD-10-CM | POA: Diagnosis not present

## 2023-06-30 DIAGNOSIS — I10 Essential (primary) hypertension: Secondary | ICD-10-CM | POA: Diagnosis not present

## 2023-06-30 DIAGNOSIS — E1165 Type 2 diabetes mellitus with hyperglycemia: Secondary | ICD-10-CM | POA: Diagnosis not present

## 2023-06-30 DIAGNOSIS — E1129 Type 2 diabetes mellitus with other diabetic kidney complication: Secondary | ICD-10-CM | POA: Diagnosis not present

## 2023-06-30 DIAGNOSIS — N1832 Chronic kidney disease, stage 3b: Secondary | ICD-10-CM | POA: Diagnosis not present

## 2023-07-18 ENCOUNTER — Emergency Department
Admission: EM | Admit: 2023-07-18 | Discharge: 2023-07-18 | Discharge status: Against Medical Advice | Payer: Medicare Other | Attending: Emergency Medicine | Admitting: Emergency Medicine

## 2023-07-18 ENCOUNTER — Other Ambulatory Visit: Payer: Self-pay

## 2023-07-18 DIAGNOSIS — R4182 Altered mental status, unspecified: Secondary | ICD-10-CM | POA: Diagnosis present

## 2023-07-18 DIAGNOSIS — Z5321 Procedure and treatment not carried out due to patient leaving prior to being seen by health care provider: Secondary | ICD-10-CM | POA: Diagnosis not present

## 2023-07-18 DIAGNOSIS — F02811 Dementia in other diseases classified elsewhere, unspecified severity, with agitation: Secondary | ICD-10-CM | POA: Insufficient documentation

## 2023-07-18 DIAGNOSIS — G3183 Dementia with Lewy bodies: Secondary | ICD-10-CM | POA: Insufficient documentation

## 2023-07-18 LAB — URINALYSIS, ROUTINE W REFLEX MICROSCOPIC
Bilirubin Urine: NEGATIVE
Glucose, UA: 150 mg/dL — AB
Hgb urine dipstick: NEGATIVE
Ketones, ur: NEGATIVE mg/dL
Nitrite: NEGATIVE
Protein, ur: 30 mg/dL — AB
Specific Gravity, Urine: 1.011 (ref 1.005–1.030)
pH: 6 (ref 5.0–8.0)

## 2023-07-18 LAB — COMPREHENSIVE METABOLIC PANEL
ALT: 28 U/L (ref 0–44)
AST: 29 U/L (ref 15–41)
Albumin: 3.8 g/dL (ref 3.5–5.0)
Alkaline Phosphatase: 114 U/L (ref 38–126)
Anion gap: 10 (ref 5–15)
BUN: 27 mg/dL — ABNORMAL HIGH (ref 8–23)
CO2: 26 mmol/L (ref 22–32)
Calcium: 8.6 mg/dL — ABNORMAL LOW (ref 8.9–10.3)
Chloride: 97 mmol/L — ABNORMAL LOW (ref 98–111)
Creatinine, Ser: 1.02 mg/dL — ABNORMAL HIGH (ref 0.44–1.00)
GFR, Estimated: 52 mL/min — ABNORMAL LOW (ref >60)
Glucose, Bld: 254 mg/dL — ABNORMAL HIGH (ref 70–99)
Potassium: 4 mmol/L (ref 3.5–5.1)
Sodium: 133 mmol/L — ABNORMAL LOW (ref 135–145)
Total Bilirubin: 0.8 mg/dL (ref 0.3–1.2)
Total Protein: 7.4 g/dL (ref 6.5–8.1)

## 2023-07-18 LAB — CBC
HCT: 41 % (ref 36.0–46.0)
Hemoglobin: 13.6 g/dL (ref 12.0–15.0)
MCH: 30.3 pg (ref 26.0–34.0)
MCHC: 33.2 g/dL (ref 30.0–36.0)
MCV: 91.3 fL (ref 80.0–100.0)
Platelets: 229 10*3/uL (ref 150–400)
RBC: 4.49 MIL/uL (ref 3.87–5.11)
RDW: 13.3 % (ref 11.5–15.5)
WBC: 9.1 10*3/uL (ref 4.0–10.5)
nRBC: 0 % (ref 0.0–0.2)

## 2023-07-18 LAB — CBG MONITORING, ED: Glucose-Capillary: 240 mg/dL — ABNORMAL HIGH (ref 70–99)
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
## 2023-07-18 NOTE — ED Provider Triage Note (Signed)
Emergency Medicine Provider Triage Evaluation Note  [name], a 89 y.o. female  was evaluated in triage.  Pt complains of AMS. She has a history of Lewybody dementia, and her adult daughter, reports increased agitation and confusion. She is followed by Dr. [name] (neuro).   Review of Systems  Positive: AMS Negative: FCS  Physical Exam  BP (!) 190/103   Pulse 88   Temp 98.2 F (36.8 C) (Oral)   Resp 18   Ht 5\' 2"  (1.575 m)   Wt 59 kg   SpO2 97%   BMI 23.78 kg/m  Gen:   Awake, no distress  NAD Resp:  Normal effort CTA MSK:   Moves extremities without difficulty  Other:    Medical Decision Making  Medically screening exam initiated at 7:23 PM.  Appropriate orders placed.  [name] was informed that the remainder of the evaluation will be completed by another provider, this initial triage assessment does not replace that evaluation, and the importance of remaining in the ED until their evaluation is complete.  Geriatric patient to the ED for evaluation of AMS.  Patient presents with her adult daughter who voices concerns for increased agitation and confusion despite being managed with medicines by neurology.   Lissa Hoard, PA-C 07/18/23 1928
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
## 2023-07-18 NOTE — ED Notes (Signed)
Urine sample sent to the lab at this time.  

## 2023-07-19 ENCOUNTER — Emergency Department
Admission: EM | Admit: 2023-07-19 | Discharge: 2023-07-21 | Disposition: A | Discharge status: Home / Self Care | Payer: Medicare Other | Attending: Emergency Medicine | Admitting: Emergency Medicine

## 2023-07-19 ENCOUNTER — Encounter: Payer: Self-pay | Admitting: *Deleted

## 2023-07-19 ENCOUNTER — Other Ambulatory Visit: Payer: Self-pay

## 2023-07-19 DIAGNOSIS — F0280 Dementia in other diseases classified elsewhere without behavioral disturbance: Secondary | ICD-10-CM | POA: Diagnosis not present

## 2023-07-19 DIAGNOSIS — R799 Abnormal finding of blood chemistry, unspecified: Secondary | ICD-10-CM | POA: Insufficient documentation

## 2023-07-19 DIAGNOSIS — R8290 Unspecified abnormal findings in urine: Secondary | ICD-10-CM

## 2023-07-19 DIAGNOSIS — F29 Unspecified psychosis not due to a substance or known physiological condition: Secondary | ICD-10-CM | POA: Diagnosis not present

## 2023-07-19 DIAGNOSIS — F0390 Unspecified dementia without behavioral disturbance: Secondary | ICD-10-CM | POA: Diagnosis not present

## 2023-07-19 DIAGNOSIS — R410 Disorientation, unspecified: Secondary | ICD-10-CM | POA: Diagnosis not present

## 2023-07-19 DIAGNOSIS — G3183 Dementia with Lewy bodies: Secondary | ICD-10-CM | POA: Diagnosis not present

## 2023-07-19 LAB — URINALYSIS, W/ REFLEX TO CULTURE (INFECTION SUSPECTED)
Bilirubin Urine: NEGATIVE
Glucose, UA: 50 mg/dL — AB
Hgb urine dipstick: NEGATIVE
Ketones, ur: NEGATIVE mg/dL
Nitrite: NEGATIVE
Protein, ur: 100 mg/dL — AB
Specific Gravity, Urine: 1.015 (ref 1.005–1.030)
pH: 6 (ref 5.0–8.0)

## 2023-07-19 LAB — COMPREHENSIVE METABOLIC PANEL
ALT: 26 U/L (ref 0–44)
AST: 25 U/L (ref 15–41)
Albumin: 3.3 g/dL — ABNORMAL LOW (ref 3.5–5.0)
Alkaline Phosphatase: 106 U/L (ref 38–126)
Anion gap: 9 (ref 5–15)
BUN: 21 mg/dL (ref 8–23)
CO2: 28 mmol/L (ref 22–32)
Calcium: 8.4 mg/dL — ABNORMAL LOW (ref 8.9–10.3)
Chloride: 98 mmol/L (ref 98–111)
Creatinine, Ser: 0.98 mg/dL (ref 0.44–1.00)
GFR, Estimated: 54 mL/min — ABNORMAL LOW (ref >60)
Glucose, Bld: 200 mg/dL — ABNORMAL HIGH (ref 70–99)
Potassium: 3.3 mmol/L — ABNORMAL LOW (ref 3.5–5.1)
Sodium: 135 mmol/L (ref 135–145)
Total Bilirubin: 0.9 mg/dL (ref 0.3–1.2)
Total Protein: 6.4 g/dL — ABNORMAL LOW (ref 6.5–8.1)

## 2023-07-19 LAB — CBC WITH DIFFERENTIAL/PLATELET
Abs Immature Granulocytes: 0.01 10*3/uL (ref 0.00–0.07)
Basophils Absolute: 0.1 10*3/uL (ref 0.0–0.1)
Basophils Relative: 1 %
Eosinophils Absolute: 0.5 10*3/uL (ref 0.0–0.5)
Eosinophils Relative: 6 %
HCT: 37.7 % (ref 36.0–46.0)
Hemoglobin: 12.5 g/dL (ref 12.0–15.0)
Immature Granulocytes: 0 %
Lymphocytes Relative: 31 %
Lymphs Abs: 2.8 10*3/uL (ref 0.7–4.0)
MCH: 30.3 pg (ref 26.0–34.0)
MCHC: 33.2 g/dL (ref 30.0–36.0)
MCV: 91.3 fL (ref 80.0–100.0)
Monocytes Absolute: 1.1 10*3/uL — ABNORMAL HIGH (ref 0.1–1.0)
Monocytes Relative: 12 %
Neutro Abs: 4.5 10*3/uL (ref 1.7–7.7)
Neutrophils Relative %: 50 %
Platelets: 208 10*3/uL (ref 150–400)
RBC: 4.13 MIL/uL (ref 3.87–5.11)
RDW: 13.3 % (ref 11.5–15.5)
WBC: 8.9 10*3/uL (ref 4.0–10.5)
nRBC: 0 % (ref 0.0–0.2)

## 2023-07-19 LAB — TROPONIN I (HIGH SENSITIVITY): Troponin I (High Sensitivity): 10 ng/L (ref <18)

## 2023-07-19 MED ORDER — OLANZAPINE 5 MG PO TBDP
10.0000 mg | ORAL_TABLET | Freq: Once | ORAL | Status: AC
  Administered 2023-07-19: 10 mg via ORAL
  Filled 2023-07-19: qty 2

## 2023-07-19 NOTE — ED Notes (Signed)
Patient ambulatory to restroom and UA obtained. Back to hospital bed without incident.

## 2023-07-19 NOTE — ED Notes (Signed)
NO LABS or Urine at this time per DR [facility].  Pt was in ER yesterday and had labs and urine.

## 2023-07-19 NOTE — BH Assessment (Signed)
Comprehensive Clinical Assessment (CCA) Screening, Triage and Referral Note  07/19/2023 WINNER BERGLAND [id number] Recommendations for Services/Supports/Treatments: Psych consult/disposition pending. Tashina L. Lonsway "[name]" is a 89 y.o., Caucasian, Not Hispanic or Latino ethnicity, ENGLISH speaking female with a history of Lewy Body Dementia.  Per triage note: Pt to triage via walker. Pt saw neurology today and was sent to ER for eval pt lives alone at home. Daughter with pt and pt states daughter is going to kill her and herself. Pt wants her food checked for poisoning. Pt was seen in ER yesterday for wandering. Pt here for check of UTI sx. Pt alert. Speech clear. Sx worse past month per daughter.  On assessment, the patient was cooperative and polite. Pt presented with a euthymic mood and a congruent affect. Pt was engaged, but was unable to identify why she'd presented to the ED or any concerns her doctors/daughter have. Pt denied having sx of paranoia or feeling that she's under threat. Pt denied sx of depression and anxiety. Pt's speech clear and coherent. Pt was oriented x3. Pt had lacking insight and poor judgment. Pt denied current SI/HI/AV/H.  Collateral: Jennye Moccasin [phone] Daughter expressed concerns about the pt living alone due to her mental status/dementia dx. Daughter reported that the pt hallucinates and believes that people are in her house with guns. Daughter explained that the pt accuses her of trying to poison her food. [name] explained that the pt wanders outside without her walker and hallucinates. Daughter reported that the pt needs to be placed out of her home in a facility due to her mental instability that worsened about 1 month ago.   Chief Complaint:  Chief Complaint  Patient presents with   Psychiatric Evaluation   Visit Diagnosis: Dementia  Patient Reported Information How did you hear about [place]? No data recorded What Is the Reason for Your Visit/Call  Today? No data recorded How Long Has This Been Causing You Problems? No data recorded What Do You Feel Would Help You the Most Today? No data recorded  Have You Recently Had Any Thoughts About Hurting Yourself? No data recorded Are You Planning to Commit Suicide/Harm Yourself At This time? No data recorded  Have you Recently Had Thoughts About Hurting Someone Karolee Ohs? No data recorded Are You Planning to Harm Someone at This Time? No data recorded Explanation: No data recorded  Have You Used Any Alcohol or Drugs in the Past 24 Hours? No data recorded How Long Ago Did You Use Drugs or Alcohol? No data recorded What Did You Use and How Much? No data recorded  Do You Currently Have a Therapist/Psychiatrist? No data recorded Name of Therapist/Psychiatrist: No data recorded  Have You Been Recently Discharged From Any Office Practice or Programs? No data recorded Explanation of Discharge From Practice/Program: No data recorded   CCA Screening Triage Referral Assessment Type of Contact: No data recorded Telemedicine Service Delivery:   Is this Initial or Reassessment?   Date Telepsych consult ordered in [facility]:    Time Telepsych consult ordered in [facility]:    Location of Assessment: No data recorded Provider Location: No data recorded   Collateral Involvement: No data recorded  Does Patient Have a Court Appointed Legal Guardian? No data recorded Name and Contact of Legal Guardian: No data recorded If Minor and Not Living with Parent(s), Who has Custody? No data recorded Is CPS involved or ever been involved? No data recorded Is APS involved or ever been involved? No data recorded  Patient Determined  To Be At Risk for Harm To Self or Others Based on Review of Patient Reported Information or Presenting Complaint? No data recorded Method: No data recorded Availability of Means: No data recorded Intent: No data recorded Notification Required: No data recorded Additional Information for Danger  to Others Potential: No data recorded Additional Comments for Danger to Others Potential: No data recorded Are There Guns or Other Weapons in Your Home? No data recorded Types of Guns/Weapons: No data recorded Are These Weapons Safely Secured?                            No data recorded Who Could Verify You Are Able To Have These Secured: No data recorded Do You Have any Outstanding Charges, Pending Court Dates, Parole/Probation? No data recorded Contacted To Inform of Risk of Harm To Self or Others: No data recorded  Does Patient Present under Involuntary Commitment? No data recorded   [place] of Residence: No data recorded  Patient Currently Receiving the Following Services: No data recorded  Determination of Need: No data recorded  Options For Referral: No data recorded  Discharge Disposition:     Lavaya Defreitas R Felipe Cabell, LCAS

## 2023-07-19 NOTE — ED Provider Notes (Signed)
[facility] Provider Note   Event Date/Time   First MD Initiated Contact with Patient 07/19/23 1539     (approximate) History  Psychiatric Evaluation  HPI [name] is a 89 y.o. female with recent diagnosis by neurology of dementia who was sent to the ER from clinic today as they do not feel that is safe for her to continue living by herself.  Daughter is at bedside and states that patient has voiced concern about the daughter killing patient as well as herself.  Patient is often asking for food to be checked for poison.  Patient was seen in ER yesterday due to wandering.  Patient denies any complaints at this time ROS: Patient currently denies any vision changes, tinnitus, difficulty speaking, facial droop, sore throat, chest pain, shortness of breath, abdominal pain, nausea/vomiting/diarrhea, dysuria, or weakness/numbness/paresthesias in any extremity   Physical Exam  Triage Vital Signs: ED Triage Vitals  Encounter Vitals Group     BP 07/19/23 1517 (!) 155/94     Systolic BP Percentile --      Diastolic BP Percentile --      Pulse Rate 07/19/23 1517 72     Resp 07/19/23 1517 18     Temp 07/19/23 1517 98.2 F (36.8 C)     Temp Source 07/19/23 1517 Oral     SpO2 07/19/23 1846 98 %     Weight 07/19/23 1512 130 lb 1.1 oz (59 kg)     Height 07/19/23 1512 5\' 2"  (1.575 m)     Head Circumference --      Peak Flow --      Pain Score 07/19/23 1512 0     Pain Loc --      Pain Education --      Exclude from Growth Chart --    Most recent vital signs: Vitals:   07/19/23 1517 07/19/23 1846  BP: (!) 155/94 (!) 150/88  Pulse: 72 70  Resp: 18 16  Temp: 98.2 F (36.8 C)   SpO2:  98%   General: Awake, disoriented at. CV:  Good peripheral perfusion.  Resp:  Normal effort.  Abd:  No distention.  Other:  Elderly well-developed, well-nourished Caucasian female resting comfortably in no acute distress ED Results / Procedures / Treatments  Labs (all  labs ordered are listed, but only abnormal results are displayed) Labs Reviewed  COMPREHENSIVE METABOLIC PANEL - Abnormal; Notable for the following components:      Result Value   Potassium 3.3 (*)    Glucose, Bld 200 (*)    Calcium 8.4 (*)    Total Protein 6.4 (*)    Albumin 3.3 (*)    GFR, Estimated 54 (*)    All other components within normal limits  CBC WITH DIFFERENTIAL/PLATELET - Abnormal; Notable for the following components:   Monocytes Absolute 1.1 (*)    All other components within normal limits  URINALYSIS, W/ REFLEX TO CULTURE (INFECTION SUSPECTED) - Abnormal; Notable for the following components:   Color, Urine YELLOW (*)    APPearance CLEAR (*)    Glucose, UA 50 (*)    Protein, ur 100 (*)    Leukocytes,Ua MODERATE (*)    Bacteria, UA RARE (*)    All other components within normal limits  TROPONIN I (HIGH SENSITIVITY)  TROPONIN I (HIGH SENSITIVITY)   PROCEDURES: Critical Care performed: No Procedures MEDICATIONS ORDERED IN ED: Medications  OLANZapine zydis (ZYPREXA) disintegrating tablet 10 mg (10 mg Oral Given 07/19/23 2121)  IMPRESSION / MDM / ASSESSMENT AND PLAN / ED COURSE  I reviewed the triage vital signs and the nursing notes.                             The patient is on the cardiac monitor to evaluate for evidence of arrhythmia and/or significant heart rate changes. Patient's presentation is most consistent with acute presentation with potential threat to life or bodily function. Thoughts are linear and organized, and patient has no SI, AH, VH, or HI. Prior suicide attempt: Denies Prior Psychiatric Hospitalizations: None  Clinically patient displays no overt toxidrome; they are well appearing, with low suspicion for toxic ingestion given history and exam. Thoughts unlikely 2/2 anemia, hypothyroidism, infection, or ICH.  Consult: Psychiatry to evaluate patient for potential hold for danger to self. Disposition: Care of this patient will be signed  out to the oncoming physician at the end of my shift.  All pertinent patient information conveyed and all questions answered.  All further care and disposition decisions will be made by the oncoming physician.   FINAL CLINICAL IMPRESSION(S) / ED DIAGNOSES   Final diagnoses:  Confusion   Rx / DC Orders   ED Discharge Orders     None      Note:  This document was prepared using Dragon voice recognition software and may include unintentional dictation errors.   Merwyn Katos, MD 07/20/23 [phone]

## 2023-07-19 NOTE — ED Notes (Signed)
Patient becoming more agitated screaming that she is leaving and not staying here tonight. Orders obtained for meds to help patient calm down.

## 2023-07-19 NOTE — ED Notes (Signed)
Pt had bloody urine soaked pad in panties.  Pt has strong urine smell in triage.

## 2023-07-19 NOTE — ED Notes (Signed)
Patient now more compliant and resting quietly in bed, labs obtained.

## 2023-07-19 NOTE — ED Notes (Signed)
[employer] Nude bra White panties Recruitment consultant

## 2023-07-19 NOTE — ED Triage Notes (Signed)
Pt to triage via walker.  Pt saw neurology today and was sent to ER for eval  pt lives alone at home.  Daughter with pt and pt states daughter is going to kill her and herself.  Pt wants her food checked for poisioning.  Pt was seen in ER yesterday for wandering.  Pt here for check of UTI sx.  Pt alert.  Speech clear.  Sx worse past month per daughter.

## 2023-07-20 LAB — CBG MONITORING, ED: Glucose-Capillary: 299 mg/dL — ABNORMAL HIGH (ref 70–99)

## 2023-07-20 MED ORDER — FLUOXETINE HCL 10 MG PO CAPS
10.0000 mg | ORAL_CAPSULE | Freq: Three times a day (TID) | ORAL | Status: DC
  Administered 2023-07-20 – 2023-07-21 (×2): 10 mg via ORAL
  Filled 2023-07-20 (×3): qty 1

## 2023-07-20 MED ORDER — QUETIAPINE FUMARATE 25 MG PO TABS
50.0000 mg | ORAL_TABLET | Freq: Every day | ORAL | Status: DC
  Administered 2023-07-20: 50 mg via ORAL
  Filled 2023-07-20: qty 2

## 2023-07-20 MED ORDER — LORATADINE 10 MG PO TABS
10.0000 mg | ORAL_TABLET | Freq: Every day | ORAL | Status: DC
  Administered 2023-07-20 – 2023-07-21 (×2): 10 mg via ORAL
  Filled 2023-07-20 (×2): qty 1

## 2023-07-20 MED ORDER — ASPIRIN 325 MG PO TBEC
325.0000 mg | DELAYED_RELEASE_TABLET | Freq: Every day | ORAL | Status: DC
  Administered 2023-07-20 – 2023-07-21 (×2): 325 mg via ORAL
  Filled 2023-07-20 (×2): qty 1

## 2023-07-20 MED ORDER — DROPERIDOL 2.5 MG/ML IJ SOLN: 5.0000 mg | Freq: Once | INTRAMUSCULAR | Status: DC

## 2023-07-20 MED ORDER — DUPILUMAB 300 MG/2ML ~~LOC~~ SOAJ: 300.0000 mg | SUBCUTANEOUS | Status: DC

## 2023-07-20 MED ORDER — ISOSORBIDE MONONITRATE ER 60 MG PO TB24
30.0000 mg | ORAL_TABLET | Freq: Every day | ORAL | Status: DC
  Administered 2023-07-20 – 2023-07-21 (×2): 30 mg via ORAL
  Filled 2023-07-20 (×2): qty 1

## 2023-07-20 MED ORDER — EZETIMIBE 10 MG PO TABS
10.0000 mg | ORAL_TABLET | Freq: Every day | ORAL | Status: DC
  Administered 2023-07-20 – 2023-07-21 (×2): 10 mg via ORAL
  Filled 2023-07-20 (×2): qty 1

## 2023-07-20 MED ORDER — SACUBITRIL-VALSARTAN 49-51 MG PO TABS
1.0000 | ORAL_TABLET | Freq: Two times a day (BID) | ORAL | Status: DC
Start: 2023-07-20 — End: 2023-07-21
  Administered 2023-07-20 – 2023-07-21 (×2): 1 via ORAL
  Filled 2023-07-20 (×2): qty 1

## 2023-07-20 MED ORDER — MIRABEGRON ER 25 MG PO TB24
25.0000 mg | ORAL_TABLET | Freq: Every day | ORAL | Status: DC
  Administered 2023-07-20 – 2023-07-21 (×2): 25 mg via ORAL
  Filled 2023-07-20 (×2): qty 1

## 2023-07-20 MED ORDER — CLOPIDOGREL BISULFATE 75 MG PO TABS
75.0000 mg | ORAL_TABLET | Freq: Every day | ORAL | Status: DC
  Administered 2023-07-20 – 2023-07-21 (×2): 75 mg via ORAL
  Filled 2023-07-20 (×2): qty 1

## 2023-07-20 MED ORDER — ATORVASTATIN CALCIUM 20 MG PO TABS
10.0000 mg | ORAL_TABLET | Freq: Every day | ORAL | Status: DC
  Administered 2023-07-20 – 2023-07-21 (×2): 10 mg via ORAL
  Filled 2023-07-20 (×2): qty 1

## 2023-07-20 MED ORDER — MEMANTINE HCL 5 MG PO TABS
10.0000 mg | ORAL_TABLET | Freq: Two times a day (BID) | ORAL | Status: DC
  Administered 2023-07-20 – 2023-07-21 (×2): 10 mg via ORAL
  Filled 2023-07-20 (×2): qty 2

## 2023-07-20 MED ORDER — INSULIN LISPRO (1 UNIT DIAL) 100 UNIT/ML (KWIKPEN)
12.0000 [IU] | PEN_INJECTOR | Freq: Three times a day (TID) | SUBCUTANEOUS | Status: DC
  Filled 2023-07-20: qty 3

## 2023-07-20 MED ORDER — CARVEDILOL 6.25 MG PO TABS
3.1250 mg | ORAL_TABLET | Freq: Two times a day (BID) | ORAL | Status: DC
  Administered 2023-07-20 – 2023-07-21 (×2): 3.125 mg via ORAL
  Filled 2023-07-20 (×2): qty 1

## 2023-07-20 MED ORDER — PANTOPRAZOLE SODIUM 20 MG PO TBEC
20.0000 mg | DELAYED_RELEASE_TABLET | Freq: Every day | ORAL | Status: DC
  Administered 2023-07-20 – 2023-07-21 (×2): 20 mg via ORAL
  Filled 2023-07-20 (×2): qty 1

## 2023-07-20 MED ORDER — DROPERIDOL 2.5 MG/ML IJ SOLN
5.0000 mg | Freq: Once | INTRAMUSCULAR | Status: AC
  Administered 2023-07-20: 5 mg via INTRAMUSCULAR

## 2023-07-20 MED ORDER — INSULIN GLARGINE-YFGN 100 UNIT/ML ~~LOC~~ SOLN
12.0000 [IU] | Freq: Every day | SUBCUTANEOUS | Status: DC
  Administered 2023-07-21: 12 [IU] via SUBCUTANEOUS
  Filled 2023-07-20 (×2): qty 0.12

## 2023-07-20 NOTE — ED Notes (Addendum)
Pt. Having some increased agitation, states, "they trying to kill me with them pills!" And "I was just worried about the man last week trying to kill me!" Pt. Is redirectable, declines any juice or snack, states, "I just want to go home." Dr. Fuller Plan aware, pt. Is still calm and redirectable at this time. Will monitor for increasing agitation, activity, or aggression.

## 2023-07-20 NOTE — ED Provider Notes (Signed)
Becoming increasingly agitated, not verbally redirectable, manual hold required for agitation requiring IM sedation for staff and patient's safety.   Pilar Jarvis, MD 07/20/23 [phone]

## 2023-07-20 NOTE — ED Notes (Signed)
Dr. Fuller Plan  notified that pt. Does not have any home meds ordered. Pharmacy request to update pt's med list.

## 2023-07-20 NOTE — ED Provider Notes (Signed)
Emergency Medicine Observation Re-evaluation Note  [name] is a 89 y.o. female, seen on rounds today.  Pt initially presented to the ED for complaints of Psychiatric Evaluation Currently, the patient is resting, voices no medical complaints.  Physical Exam  BP (!) 150/88 (BP Location: Left Arm)   Pulse 70   Temp 98.2 F (36.8 C) (Oral)   Resp 16   Ht 5\' 2"  (1.575 m)   Wt 59 kg   SpO2 98%   BMI 23.79 kg/m  Physical Exam General: Resting in no acute distress Cardiac: No cyanosis Lungs: Equal rise and fall Psych: Not agitated  ED Course / MDM  EKG:   I have reviewed the labs performed to date as well as medications administered while in observation.  Recent changes in the last 24 hours include no events overnight.  Plan  Current plan is for psychiatric disposition.    Irean Hong, MD 07/20/23 [phone]

## 2023-07-20 NOTE — ED Notes (Signed)
Pt walked into hallway stated she thought that people were trying to poison her.  She was fearful and wanted to leave alone.  Pt was given PRN ordered by on call ED MD. Moved into private room, pt then requested a soda and cheese burger.  Meal and soda provided, pt was able to calm self.  Cont to monitor as ordered

## 2023-07-20 NOTE — ED Notes (Signed)
Pt. Requested to sit on side of bed to stretch her legs. One siderail down, pt. Verbalizes understanding not to attempt to stand on her own. Pt's table placed at bedside. This RN offered refreshment, pt. Declined. Denies further need.

## 2023-07-20 NOTE — ED Notes (Signed)
Messaged pharm to send [facility]

## 2023-07-20 NOTE — ED Notes (Signed)
Report given to quad [employer]. Patient moving to [facility].

## 2023-07-20 NOTE — ED Notes (Signed)
TOC placement 

## 2023-07-20 NOTE — ED Notes (Signed)
Pt set up for breakfast. Breakfast cut to bite size pieces, food prepared for consumption. Pt. Began eating without issue, consumed all of breakfast minus bacon.

## 2023-07-20 NOTE — ED Notes (Signed)
Pt asleep at this time. VS to be taken once pt awakens and snack to be offered.

## 2023-07-20 NOTE — ED Notes (Signed)
This RN called pt's son Fayrene Fearing, and updated on POC, and pt's condition.

## 2023-07-20 NOTE — ED Notes (Signed)
This tech set up dinner tray for pt. Food was cut up and ready for pt to eat. Pt denied the tray. This tech tried to convince pt to eat some food or drink some of her drink. Pt kept refusing. This tech set dinner tray aside and will see if pt wants some food later.

## 2023-07-20 NOTE — ED Notes (Signed)
Pt offered PM snack at this time. Pt refused. Pt stated, "I still feel full from the big dinner I ate." Rosalie Doctor, RN and MD Mod[place] Charon made aware or pt BP.

## 2023-07-21 DIAGNOSIS — F0280 Dementia in other diseases classified elsewhere without behavioral disturbance: Secondary | ICD-10-CM | POA: Diagnosis not present

## 2023-07-21 DIAGNOSIS — G3183 Dementia with Lewy bodies: Secondary | ICD-10-CM | POA: Diagnosis not present

## 2023-07-21 LAB — CBG MONITORING, ED
Glucose-Capillary: 230 mg/dL — ABNORMAL HIGH (ref 70–99)
Glucose-Capillary: 231 mg/dL — ABNORMAL HIGH (ref 70–99)
Glucose-Capillary: 162 mg/dL — ABNORMAL HIGH (ref 70–99)
Glucose-Capillary: 142 mg/dL — ABNORMAL HIGH (ref 70–99)

## 2023-07-21 MED ORDER — CEPHALEXIN 500 MG PO CAPS
500.0000 mg | ORAL_CAPSULE | Freq: Once | ORAL | Status: AC
  Administered 2023-07-21: 500 mg via ORAL
  Filled 2023-07-21: qty 1

## 2023-07-21 MED ORDER — INSULIN ASPART 100 UNIT/ML IJ SOLN: 15.0000 [IU] | Freq: Every day | INTRAMUSCULAR | Status: DC

## 2023-07-21 MED ORDER — INSULIN ASPART 100 UNIT/ML IJ SOLN
12.0000 [IU] | Freq: Every day | INTRAMUSCULAR | Status: DC
  Administered 2023-07-21: 12 [IU] via SUBCUTANEOUS
  Filled 2023-07-21: qty 1

## 2023-07-21 MED ORDER — CEPHALEXIN 500 MG PO CAPS: 500.0000 mg | ORAL_CAPSULE | Freq: Two times a day (BID) | ORAL | 0 refills | Status: AC

## 2023-07-21 NOTE — ED Notes (Signed)
Pt given breakfast tray and beverage.  

## 2023-07-21 NOTE — Discharge Instructions (Signed)
I sent a prescription for an antibiotic to your pharmacy.  Please take this as directed.  Use the resources provided by our behavioral health team.  Return to the ER for new or worsening symptoms.

## 2023-07-21 NOTE — ED Notes (Signed)
vol/pending disposition.[name]

## 2023-07-21 NOTE — ED Provider Notes (Signed)
-----------------------------------------   6:19 AM on 07/21/2023 -----------------------------------------   Blood pressure 137/68, pulse 82, temperature 97.6 F (36.4 C), temperature source Oral, resp. rate 14, height 5\' 2"  (1.575 m), weight 59 kg, SpO2 97%.  The patient is calm and cooperative at this time.  There have been no acute events since the last update.  Awaiting disposition plan from [facility] team.   Janith Lima, MD 07/21/23 [phone]

## 2023-07-21 NOTE — ED Provider Notes (Signed)
Notified by NP Rosario Adie with psychiatry team that patient had been evaluated and cleared for discharge.  I did review her note.  Notes that the patient is safe to discharge home given no disruptive behavior in 24 hours.  Patient was provided with resources.  She did note that patient had equivocal urine which could be contributing to her psychiatric symptoms.  With this, she was given a dose of Keflex and will DC with a prescription for this.  Patient reevaluated.  Family at bedside and comfortable with plan for discharge.  Strict return precautions provided.  Patient discharged in stable condition.   Trinna Post, MD 07/21/23 [phone]
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
## 2023-07-21 NOTE — ED Notes (Signed)
PT ate 75% of meal tray
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
## 2023-07-22 DIAGNOSIS — E1165 Type 2 diabetes mellitus with hyperglycemia: Secondary | ICD-10-CM | POA: Diagnosis not present

## 2023-07-22 DIAGNOSIS — Z794 Long term (current) use of insulin: Secondary | ICD-10-CM | POA: Diagnosis not present

## 2023-07-23 ENCOUNTER — Emergency Department
Admission: EM | Admit: 2023-07-23 | Discharge: 2023-08-23 | Disposition: A | Discharge status: Home / Self Care | Payer: Medicare Other | Attending: Emergency Medicine | Admitting: Emergency Medicine

## 2023-07-23 ENCOUNTER — Emergency Department: Payer: Medicare Other

## 2023-07-23 ENCOUNTER — Other Ambulatory Visit: Payer: Self-pay

## 2023-07-23 DIAGNOSIS — R296 Repeated falls: Secondary | ICD-10-CM | POA: Insufficient documentation

## 2023-07-23 DIAGNOSIS — R2681 Unsteadiness on feet: Secondary | ICD-10-CM | POA: Insufficient documentation

## 2023-07-23 DIAGNOSIS — E119 Type 2 diabetes mellitus without complications: Secondary | ICD-10-CM | POA: Insufficient documentation

## 2023-07-23 DIAGNOSIS — I6782 Cerebral ischemia: Secondary | ICD-10-CM | POA: Insufficient documentation

## 2023-07-23 DIAGNOSIS — R739 Hyperglycemia, unspecified: Secondary | ICD-10-CM | POA: Diagnosis not present

## 2023-07-23 DIAGNOSIS — S0011XA Contusion of right eyelid and periocular area, initial encounter: Secondary | ICD-10-CM | POA: Insufficient documentation

## 2023-07-23 DIAGNOSIS — I10 Essential (primary) hypertension: Secondary | ICD-10-CM | POA: Diagnosis not present

## 2023-07-23 DIAGNOSIS — M6281 Muscle weakness (generalized): Secondary | ICD-10-CM | POA: Diagnosis not present

## 2023-07-23 DIAGNOSIS — R58 Hemorrhage, not elsewhere classified: Secondary | ICD-10-CM | POA: Diagnosis not present

## 2023-07-23 DIAGNOSIS — W19XXXA Unspecified fall, initial encounter: Secondary | ICD-10-CM | POA: Insufficient documentation

## 2023-07-23 DIAGNOSIS — S0083XA Contusion of other part of head, initial encounter: Secondary | ICD-10-CM | POA: Diagnosis not present

## 2023-07-23 DIAGNOSIS — F0390 Unspecified dementia without behavioral disturbance: Secondary | ICD-10-CM | POA: Diagnosis present

## 2023-07-23 DIAGNOSIS — M47812 Spondylosis without myelopathy or radiculopathy, cervical region: Secondary | ICD-10-CM | POA: Insufficient documentation

## 2023-07-23 DIAGNOSIS — F0282 Dementia in other diseases classified elsewhere, unspecified severity, with psychotic disturbance: Secondary | ICD-10-CM | POA: Insufficient documentation

## 2023-07-23 DIAGNOSIS — G319 Degenerative disease of nervous system, unspecified: Secondary | ICD-10-CM | POA: Diagnosis not present

## 2023-07-23 DIAGNOSIS — F22 Delusional disorders: Secondary | ICD-10-CM | POA: Diagnosis not present

## 2023-07-23 DIAGNOSIS — Z743 Need for continuous supervision: Secondary | ICD-10-CM | POA: Diagnosis not present

## 2023-07-23 DIAGNOSIS — R6889 Other general symptoms and signs: Secondary | ICD-10-CM | POA: Diagnosis not present

## 2023-07-23 DIAGNOSIS — I480 Paroxysmal atrial fibrillation: Secondary | ICD-10-CM | POA: Diagnosis not present

## 2023-07-23 DIAGNOSIS — M5030 Other cervical disc degeneration, unspecified cervical region: Secondary | ICD-10-CM | POA: Diagnosis not present

## 2023-07-23 DIAGNOSIS — G3183 Dementia with Lewy bodies: Secondary | ICD-10-CM | POA: Diagnosis not present

## 2023-07-23 DIAGNOSIS — E785 Hyperlipidemia, unspecified: Secondary | ICD-10-CM | POA: Insufficient documentation

## 2023-07-23 DIAGNOSIS — S0992XA Unspecified injury of nose, initial encounter: Secondary | ICD-10-CM | POA: Diagnosis present

## 2023-07-23 DIAGNOSIS — S0012XA Contusion of left eyelid and periocular area, initial encounter: Secondary | ICD-10-CM | POA: Insufficient documentation

## 2023-07-23 DIAGNOSIS — S022XXA Fracture of nasal bones, initial encounter for closed fracture: Secondary | ICD-10-CM | POA: Diagnosis not present

## 2023-07-23 DIAGNOSIS — K644 Residual hemorrhoidal skin tags: Secondary | ICD-10-CM

## 2023-07-23 LAB — CBC WITH DIFFERENTIAL/PLATELET
Abs Immature Granulocytes: 0.04 10*3/uL (ref 0.00–0.07)
Basophils Absolute: 0.1 10*3/uL (ref 0.0–0.1)
Basophils Relative: 1 %
Eosinophils Absolute: 0.6 10*3/uL — ABNORMAL HIGH (ref 0.0–0.5)
Eosinophils Relative: 7 %
HCT: 39 % (ref 36.0–46.0)
Hemoglobin: 12.9 g/dL (ref 12.0–15.0)
Immature Granulocytes: 0 %
Lymphocytes Relative: 14 %
Lymphs Abs: 1.3 10*3/uL (ref 0.7–4.0)
MCH: 30.3 pg (ref 26.0–34.0)
MCHC: 33.1 g/dL (ref 30.0–36.0)
MCV: 91.5 fL (ref 80.0–100.0)
Monocytes Absolute: 0.7 10*3/uL (ref 0.1–1.0)
Monocytes Relative: 8 %
Neutro Abs: 6.3 10*3/uL (ref 1.7–7.7)
Neutrophils Relative %: 70 %
Platelets: 208 10*3/uL (ref 150–400)
RBC: 4.26 MIL/uL (ref 3.87–5.11)
RDW: 13.3 % (ref 11.5–15.5)
WBC: 9 10*3/uL (ref 4.0–10.5)
nRBC: 0 % (ref 0.0–0.2)

## 2023-07-23 LAB — URINALYSIS, ROUTINE W REFLEX MICROSCOPIC
Bacteria, UA: NONE SEEN
Bilirubin Urine: NEGATIVE
Glucose, UA: >=500 mg/dL — AB
Hgb urine dipstick: NEGATIVE
Ketones, ur: NEGATIVE mg/dL
Nitrite: NEGATIVE
Protein, ur: 100 mg/dL — AB
Specific Gravity, Urine: 1.013 (ref 1.005–1.030)
pH: 6 (ref 5.0–8.0)

## 2023-07-23 LAB — COMPREHENSIVE METABOLIC PANEL
ALT: 25 U/L (ref 0–44)
AST: 28 U/L (ref 15–41)
Albumin: 3.3 g/dL — ABNORMAL LOW (ref 3.5–5.0)
Alkaline Phosphatase: 105 U/L (ref 38–126)
Anion gap: 10 (ref 5–15)
BUN: 22 mg/dL (ref 8–23)
CO2: 28 mmol/L (ref 22–32)
Calcium: 8.4 mg/dL — ABNORMAL LOW (ref 8.9–10.3)
Chloride: 97 mmol/L — ABNORMAL LOW (ref 98–111)
Creatinine, Ser: 0.93 mg/dL (ref 0.44–1.00)
GFR, Estimated: 58 mL/min — ABNORMAL LOW (ref >60)
Glucose, Bld: 461 mg/dL — ABNORMAL HIGH (ref 70–99)
Potassium: 3.2 mmol/L — ABNORMAL LOW (ref 3.5–5.1)
Sodium: 135 mmol/L (ref 135–145)
Total Bilirubin: 1.1 mg/dL (ref 0.3–1.2)
Total Protein: 6.6 g/dL (ref 6.5–8.1)

## 2023-07-23 LAB — CBG MONITORING, ED
Glucose-Capillary: 351 mg/dL — ABNORMAL HIGH (ref 70–99)
Glucose-Capillary: 178 mg/dL — ABNORMAL HIGH (ref 70–99)

## 2023-07-23 LAB — HEMOGLOBIN A1C
Hgb A1c MFr Bld: 8.4 % — ABNORMAL HIGH (ref 4.8–5.6)
Mean Plasma Glucose: 194.38 mg/dL

## 2023-07-23 LAB — TROPONIN I (HIGH SENSITIVITY): Troponin I (High Sensitivity): 11 ng/L (ref <18)

## 2023-07-23 LAB — CK: Total CK: 126 U/L (ref 38–234)

## 2023-07-23 MED ORDER — LORATADINE 10 MG PO TABS
10.0000 mg | ORAL_TABLET | Freq: Every day | ORAL | Status: DC
  Administered 2023-07-23 – 2023-08-23 (×31): 10 mg via ORAL
  Filled 2023-07-23 (×31): qty 1

## 2023-07-23 MED ORDER — CARVEDILOL 6.25 MG PO TABS
3.1250 mg | ORAL_TABLET | Freq: Once | ORAL | Status: AC
  Administered 2023-07-23: 3.125 mg via ORAL
  Filled 2023-07-23: qty 1

## 2023-07-23 MED ORDER — ASPIRIN 325 MG PO TBEC
325.0000 mg | DELAYED_RELEASE_TABLET | Freq: Every day | ORAL | Status: DC
  Administered 2023-07-23 – 2023-08-23 (×31): 325 mg via ORAL
  Filled 2023-07-23 (×31): qty 1

## 2023-07-23 MED ORDER — MIRABEGRON ER 25 MG PO TB24
25.0000 mg | ORAL_TABLET | Freq: Every day | ORAL | Status: DC
  Administered 2023-07-23 – 2023-08-23 (×31): 25 mg via ORAL
  Filled 2023-07-23 (×32): qty 1

## 2023-07-23 MED ORDER — FLUOXETINE HCL 10 MG PO CAPS
10.0000 mg | ORAL_CAPSULE | Freq: Three times a day (TID) | ORAL | Status: DC
  Administered 2023-07-23 – 2023-08-23 (×88): 10 mg via ORAL
  Filled 2023-07-23 (×101): qty 1

## 2023-07-23 MED ORDER — DUPILUMAB 300 MG/2ML ~~LOC~~ SOAJ: 300.0000 mg | SUBCUTANEOUS | Status: DC

## 2023-07-23 MED ORDER — ISOSORBIDE MONONITRATE ER 60 MG PO TB24
30.0000 mg | ORAL_TABLET | Freq: Every day | ORAL | Status: DC
  Administered 2023-07-23 – 2023-08-23 (×31): 30 mg via ORAL
  Filled 2023-07-23 (×31): qty 1

## 2023-07-23 MED ORDER — MEMANTINE HCL 5 MG PO TABS
10.0000 mg | ORAL_TABLET | Freq: Two times a day (BID) | ORAL | Status: DC
  Administered 2023-07-23 – 2023-08-23 (×61): 10 mg via ORAL
  Filled 2023-07-23 (×62): qty 2

## 2023-07-23 MED ORDER — INSULIN ASPART 100 UNIT/ML IJ SOLN
0.0000 [IU] | Freq: Three times a day (TID) | INTRAMUSCULAR | Status: DC
Start: 2023-07-23 — End: 2023-08-23
  Administered 2023-07-23: 9 [IU] via SUBCUTANEOUS
  Administered 2023-07-24: 1 [IU] via SUBCUTANEOUS
  Administered 2023-07-25: 3 [IU] via SUBCUTANEOUS
  Administered 2023-07-25: 9 [IU] via SUBCUTANEOUS
  Administered 2023-07-26: 3 [IU] via SUBCUTANEOUS
  Administered 2023-07-26: 5 [IU] via SUBCUTANEOUS
  Administered 2023-07-27: 3 [IU] via SUBCUTANEOUS
  Administered 2023-07-27: 1 [IU] via SUBCUTANEOUS
  Administered 2023-07-28: 2 [IU] via SUBCUTANEOUS
  Administered 2023-07-28: 9 [IU] via SUBCUTANEOUS
  Administered 2023-07-29: 2 [IU] via SUBCUTANEOUS
  Administered 2023-07-29 (×2): 1 [IU] via SUBCUTANEOUS
  Administered 2023-07-30: 3 [IU] via SUBCUTANEOUS
  Administered 2023-07-31: 2 [IU] via SUBCUTANEOUS
  Administered 2023-08-01: 3 [IU] via SUBCUTANEOUS
  Administered 2023-08-02: 5 [IU] via SUBCUTANEOUS
  Administered 2023-08-03: 1 [IU] via SUBCUTANEOUS
  Administered 2023-08-03: 2 [IU] via SUBCUTANEOUS
  Administered 2023-08-04: 1 [IU] via SUBCUTANEOUS
  Administered 2023-08-05 – 2023-08-08 (×5): 2 [IU] via SUBCUTANEOUS
  Administered 2023-08-08: 1 [IU] via SUBCUTANEOUS
  Administered 2023-08-10: 2 [IU] via SUBCUTANEOUS
  Administered 2023-08-10: 1 [IU] via SUBCUTANEOUS
  Administered 2023-08-11 (×2): 2 [IU] via SUBCUTANEOUS
  Administered 2023-08-12: 3 [IU] via SUBCUTANEOUS
  Administered 2023-08-13 (×2): 2 [IU] via SUBCUTANEOUS
  Administered 2023-08-14: 3 [IU] via SUBCUTANEOUS
  Administered 2023-08-15: 1 [IU] via SUBCUTANEOUS
  Administered 2023-08-15: 2 [IU] via SUBCUTANEOUS
  Administered 2023-08-16 – 2023-08-17 (×2): 3 [IU] via SUBCUTANEOUS
  Administered 2023-08-17 – 2023-08-18 (×3): 2 [IU] via SUBCUTANEOUS
  Administered 2023-08-19 – 2023-08-21 (×5): 3 [IU] via SUBCUTANEOUS
  Administered 2023-08-21: 1 [IU] via SUBCUTANEOUS
  Administered 2023-08-22: 3 [IU] via SUBCUTANEOUS
  Administered 2023-08-23: 2 [IU] via SUBCUTANEOUS
  Administered 2023-08-23: 1 [IU] via SUBCUTANEOUS
  Filled 2023-07-23 (×49): qty 1

## 2023-07-23 MED ORDER — EZETIMIBE 10 MG PO TABS
10.0000 mg | ORAL_TABLET | Freq: Every day | ORAL | Status: DC
  Administered 2023-07-23 – 2023-08-23 (×31): 10 mg via ORAL
  Filled 2023-07-23 (×31): qty 1

## 2023-07-23 MED ORDER — INSULIN LISPRO (1 UNIT DIAL) 100 UNIT/ML (KWIKPEN): 12.0000 [IU] | PEN_INJECTOR | Freq: Three times a day (TID) | SUBCUTANEOUS | Status: DC

## 2023-07-23 MED ORDER — CARVEDILOL 6.25 MG PO TABS
3.1250 mg | ORAL_TABLET | Freq: Two times a day (BID) | ORAL | Status: DC
  Administered 2023-07-24 – 2023-08-23 (×58): 3.125 mg via ORAL
  Filled 2023-07-23 (×61): qty 1

## 2023-07-23 MED ORDER — INSULIN GLARGINE-YFGN 100 UNIT/ML ~~LOC~~ SOLN
12.0000 [IU] | Freq: Every day | SUBCUTANEOUS | Status: DC
  Administered 2023-07-23 – 2023-08-07 (×15): 12 [IU] via SUBCUTANEOUS
  Filled 2023-07-23 (×16): qty 0.12

## 2023-07-23 MED ORDER — CLOPIDOGREL BISULFATE 75 MG PO TABS
75.0000 mg | ORAL_TABLET | Freq: Every day | ORAL | Status: DC
  Administered 2023-07-23 – 2023-08-23 (×31): 75 mg via ORAL
  Filled 2023-07-23 (×31): qty 1

## 2023-07-23 MED ORDER — CEPHALEXIN 500 MG PO CAPS
500.0000 mg | ORAL_CAPSULE | Freq: Two times a day (BID) | ORAL | Status: DC
  Administered 2023-07-23 – 2023-08-02 (×19): 500 mg via ORAL
  Filled 2023-07-23 (×20): qty 1

## 2023-07-23 MED ORDER — SACUBITRIL-VALSARTAN 49-51 MG PO TABS
1.0000 | ORAL_TABLET | Freq: Two times a day (BID) | ORAL | Status: DC
  Administered 2023-07-23 – 2023-08-23 (×60): 1 via ORAL
  Filled 2023-07-23 (×65): qty 1

## 2023-07-23 MED ORDER — QUETIAPINE FUMARATE 25 MG PO TABS
50.0000 mg | ORAL_TABLET | Freq: Every day | ORAL | Status: DC
  Administered 2023-07-23 – 2023-08-21 (×29): 50 mg via ORAL
  Filled 2023-07-23 (×30): qty 2

## 2023-07-23 MED ORDER — PANTOPRAZOLE SODIUM 20 MG PO TBEC
20.0000 mg | DELAYED_RELEASE_TABLET | Freq: Every day | ORAL | Status: DC
  Administered 2023-07-23 – 2023-08-23 (×31): 20 mg via ORAL
  Filled 2023-07-23 (×32): qty 1

## 2023-07-23 MED ORDER — ATORVASTATIN CALCIUM 20 MG PO TABS
10.0000 mg | ORAL_TABLET | Freq: Every day | ORAL | Status: DC
  Administered 2023-07-23 – 2023-08-23 (×31): 10 mg via ORAL
  Filled 2023-07-23 (×31): qty 1

## 2023-07-23 MED ORDER — OXYMETAZOLINE HCL 0.05 % NA SOLN
1.0000 | Freq: Once | NASAL | Status: AC
  Administered 2023-07-23: 1 via NASAL
  Filled 2023-07-23: qty 30

## 2023-07-23 MED ORDER — VITAMIN E 45 MG (100 UNIT) PO CAPS
1000.0000 [IU] | ORAL_CAPSULE | Freq: Every day | ORAL | Status: DC
  Administered 2023-07-23 – 2023-08-14 (×22): 1000 [IU] via ORAL
  Filled 2023-07-23 (×26): qty 10
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
## 2023-07-23 NOTE — ED Notes (Signed)
Lunch finished, 100%. Alert, NAD, calm, interactive. No obvious bleeding. R nare packing remains in place. Tolerating well. VSS. Intermittent sneezing. Meds given.

## 2023-07-23 NOTE — ED Notes (Signed)
Pt was assisted to and from the room toilet. Her undergarment was changed and placed new gown. Pt tolerated it will. Pt was oriented to her call bell, side rails was put up, bed is in low and locked position.

## 2023-07-23 NOTE — ED Notes (Signed)
Resting comfortably with nose clamped. 1 clamp remains.
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
## 2023-07-23 NOTE — ED Notes (Signed)
EDP into room. PT alert, NAD, calm, interactive, resps e/u.

## 2023-07-23 NOTE — ED Notes (Signed)
Sleeping, NAD, calm, arousable to entering room. Encouraged voiding/ urine sample.

## 2023-07-23 NOTE — ED Notes (Signed)
EDP at BS 

## 2023-07-23 NOTE — ED Notes (Signed)
Up to [facility] w/o incident or change.

## 2023-07-23 NOTE — Evaluation (Signed)
Occupational Therapy Evaluation Patient Details Name: [name] MRN: [id number] DOB: 30-Mar-1929 Today's Date: 07/23/2023   History of Present Illness [name] "[name]" is a 89 y.o., Caucasian, Not Hispanic or Latino ethnicity, ENGLISH speaking female with a history of Lewy Body Dementia. Pt with mechanical fall at home with minimal displaced nasal bone fracture   Clinical Impression   Pt was seen for OT evaluation this date. Prior to hospital admission, pt reports independent/Mod I for all ADL's and family help with IADL's. Pt lives alone and has cognitive deficit at baseline. Pt presents to acute OT demonstrating impaired ADL performance and functional mobility 2/2 (See OT problem list for additional functional deficits). Upon arrival to room pt sitting at EOB eating lunch, agreeable to Tx. NT present at the beginning of the session. Pt completed sit<>stand t/f with MIN A + RW. Pt ambulated to toilet and completed toilet t/f, clothing management, and hygiene with Min A. Pt ambulated ~25 ft in room with Min G to MIN A. Pt returned to bed side and completed a sit>supine with Min A. Noted visual hallucination of seeing a cat in her ED room and pt stated "there is a man in my attic forcing me to marry him." Pt left supine in bed with call bell within reach and all needs met. Pt would benefit from skilled OT services to address noted impairments and functional limitations (see below for any additional details) in order to maximize safety and independence while minimizing falls risk and caregiver burden. Anticipate the need for follow up OT services upon acute hospital DC.       If plan is discharge home, recommend the following: A little help with walking and/or transfers;A little help with bathing/dressing/bathroom;Assistance with cooking/housework;Assist for transportation;Help with stairs or ramp for entrance;Supervision due to cognitive status    Functional Status Assessment   Patient has had a recent decline in their functional status and demonstrates the ability to make significant improvements in function in a reasonable and predictable amount of time.  Equipment Recommendations  BSC/3in1    Recommendations for Other Services       Precautions / Restrictions Precautions Precautions: None Restrictions Weight Bearing Restrictions: No      Mobility Bed Mobility Overal bed mobility: Needs Assistance Bed Mobility: Supine to Sit, Sit to Supine     Supine to sit: Min assist Sit to supine: Min assist     Patient Response: Cooperative  Transfers Overall transfer level: Needs assistance Equipment used: Rolling walker (2 wheels) Transfers: Sit to/from Stand Sit to Stand: Min assist                  Balance Overall balance assessment: Needs assistance Sitting-balance support: Bilateral upper extremity supported, Feet supported Sitting balance-Leahy Scale: Good     Standing balance support: Reliant on assistive device for balance, During functional activity, Bilateral upper extremity supported Standing balance-Leahy Scale: Fair                             ADL either performed or assessed with clinical judgement   ADL Overall ADL's : Needs assistance/impaired                                     Functional mobility during ADLs: Minimal assistance General ADL Comments: Pt completed toilet t/f with Min G-Min A +RW. Pt completed  clothing managment and hygiene with supervision     Vision Patient Visual Report: No change from baseline       Perception         Praxis         Pertinent Vitals/Pain Pain Assessment Pain Assessment: No/denies pain     Extremity/Trunk Assessment Upper Extremity Assessment Upper Extremity Assessment: Overall WFL for tasks assessed   Lower Extremity Assessment Lower Extremity Assessment: Defer to PT evaluation       Communication Communication Communication: No apparent  difficulties Cueing Techniques: Verbal cues   Cognition Arousal: Alert Behavior During Therapy: WFL for tasks assessed/performed Overall Cognitive Status: History of cognitive impairments - at baseline                                       General Comments       Exercises     Shoulder Instructions      Home Living Family/patient expects to be discharged to:: Private residence Living Arrangements: Alone Available Help at Discharge: Family;Available PRN/intermittently Type of Home: House Home Access: Stairs to enter [employer] of Steps: 2 Entrance Stairs-Rails: None Home Layout: One level     Bathroom Shower/Tub: Sponge bathes at baseline;Tub/shower unit         Home Equipment: Shower seat - built Charity fundraiser (2 wheels)   Additional Comments: Pt showers at daughter's home. Daughter has a walk-in shower. Pt takes sponge baths at home.      Prior Functioning/Environment Prior Level of Function : Needs assist;Independent/Modified Independent             Mobility Comments: amb with RW ADLs Comments: Mod I dressing, toileting, grooming; family assist for bathing, assist from family for IADL's.        OT Problem List: Decreased strength;Decreased range of motion;Decreased activity tolerance;Impaired balance (sitting and/or standing);Decreased safety awareness;Decreased coordination      OT Treatment/Interventions: Self-care/ADL training;Therapeutic exercise;Therapeutic activities;Energy conservation;DME and/or AE instruction;Patient/family education    OT Goals(Current goals can be found in the care plan section) Acute Rehab OT Goals Patient Stated Goal: to go home OT Goal Formulation: With patient Time For Goal Achievement: 08/05/23 Potential to Achieve Goals: Fair  OT Frequency: Min 1X/week    Co-evaluation              AM-PAC OT "6 Clicks" Daily Activity     Outcome Measure Help from another person eating meals?:  None Help from another person taking care of personal grooming?: A Little Help from another person toileting, which includes using toliet, bedpan, or urinal?: A Little Help from another person bathing (including washing, rinsing, drying)?: A Little Help from another person to put on and taking off regular upper body clothing?: A Little Help from another person to put on and taking off regular lower body clothing?: A Lot 6 Click Score: 18   End of Session Equipment Utilized During Treatment: Rolling walker (2 wheels) Nurse Communication: Mobility status  Activity Tolerance: Patient tolerated treatment well Patient left: in bed;with call bell/phone within reach;with bed alarm set  OT Visit Diagnosis: Unsteadiness on feet (R26.81);Other abnormalities of gait and mobility (R26.89);Muscle weakness (generalized) (M62.81);History of falling (Z91.81)                Time: 7829-5621 OT Time Calculation (min): 23 min Charges:     Butch Penny, SOT

## 2023-07-23 NOTE — ED Notes (Signed)
Copious blood from R nare and bloddy tears from R eye. Scant improvement with afrin nasal spray and 2 nasal clamps. Suction and ENT cart at [facility]. PT alert, NAD, calm, interactive, resps e/u, speaking clearly. BP elevated.

## 2023-07-23 NOTE — ED Notes (Addendum)
Given sip of water/PO, will attempt void/ urine sample

## 2023-07-23 NOTE — ED Notes (Signed)
Hallucinating that daughter is outside room to see her and wants [place] to let her in, no one visible/ present. Urine sample encouraged/ explained.

## 2023-07-23 NOTE — ED Notes (Signed)
[name] was given an update on her mother. [name] was also informed that [name] called and wanted an update but I called her first. [name] sts she will update [name].

## 2023-07-23 NOTE — ED Notes (Signed)
Paraoid, confused, and eating. VSS. HR intermittently elevated.

## 2023-07-24 DIAGNOSIS — M6281 Muscle weakness (generalized): Secondary | ICD-10-CM | POA: Diagnosis not present

## 2023-07-24 DIAGNOSIS — S0011XA Contusion of right eyelid and periocular area, initial encounter: Secondary | ICD-10-CM | POA: Diagnosis not present

## 2023-07-24 DIAGNOSIS — S022XXA Fracture of nasal bones, initial encounter for closed fracture: Secondary | ICD-10-CM | POA: Diagnosis not present

## 2023-07-24 DIAGNOSIS — S0083XA Contusion of other part of head, initial encounter: Secondary | ICD-10-CM | POA: Diagnosis not present

## 2023-07-24 DIAGNOSIS — R296 Repeated falls: Secondary | ICD-10-CM | POA: Diagnosis not present

## 2023-07-24 DIAGNOSIS — S0012XA Contusion of left eyelid and periocular area, initial encounter: Secondary | ICD-10-CM | POA: Diagnosis not present

## 2023-07-24 DIAGNOSIS — I480 Paroxysmal atrial fibrillation: Secondary | ICD-10-CM | POA: Diagnosis not present

## 2023-07-24 DIAGNOSIS — I6782 Cerebral ischemia: Secondary | ICD-10-CM | POA: Diagnosis not present

## 2023-07-24 DIAGNOSIS — E119 Type 2 diabetes mellitus without complications: Secondary | ICD-10-CM | POA: Diagnosis not present

## 2023-07-24 DIAGNOSIS — I10 Essential (primary) hypertension: Secondary | ICD-10-CM | POA: Diagnosis not present

## 2023-07-24 DIAGNOSIS — E785 Hyperlipidemia, unspecified: Secondary | ICD-10-CM | POA: Diagnosis not present

## 2023-07-24 DIAGNOSIS — M47812 Spondylosis without myelopathy or radiculopathy, cervical region: Secondary | ICD-10-CM | POA: Diagnosis not present

## 2023-07-24 DIAGNOSIS — R2681 Unsteadiness on feet: Secondary | ICD-10-CM | POA: Diagnosis not present

## 2023-07-24 DIAGNOSIS — M5030 Other cervical disc degeneration, unspecified cervical region: Secondary | ICD-10-CM | POA: Diagnosis not present

## 2023-07-24 LAB — CBG MONITORING, ED
Glucose-Capillary: 109 mg/dL — ABNORMAL HIGH (ref 70–99)
Glucose-Capillary: 146 mg/dL — ABNORMAL HIGH (ref 70–99)
Glucose-Capillary: 91 mg/dL (ref 70–99)

## 2023-07-24 MED ORDER — LACTATED RINGERS IV BOLUS
1000.0000 mL | Freq: Once | INTRAVENOUS | Status: AC
  Administered 2023-07-24: 1000 mL via INTRAVENOUS

## 2023-07-24 NOTE — ED Notes (Signed)
Dr. Katrinka Blazing instructed this narrator to arouse pt to see if her bp increases.  After arousing pt in bed, the pt's bp increased, but when she is sleeping her bp decreases. No orders received.

## 2023-07-24 NOTE — TOC Initial Note (Signed)
Transition of Care [facility]) - Initial/Assessment Note    Patient Details  Name: [name] MRN: [id number] Date of Birth: [birth year]  [facility]) CM/SW Contact:    Liliana Cline, LCSW Phone Number: 07/24/2023, 12:44 PM  Clinical Narrative:                 CSW called son listed as POA Fayrene Fearing to discuss PT recs. Patient is from home alone. Fayrene Fearing states patient has Dementia and over the past 1-2 months has had increased paranoia/hallucinations. Fayrene Fearing states they have talked with patient's PCP Dr. Sullivan Lone recently and he recommended Fayrene Fearing look into [facility], [facility], and [facility] for possible LTC. Explained private pay/Medicaid - Fayrene Fearing is interested in speaking with Financial Counseling to see if patient qualifies for Medicaid. CSW made referral to [facility] with Financial Counseling. Fayrene Fearing states they are agreeable to patient going to SNF for STR before going to LTC and he understands LTC placement is a process. His first choice is [facility] [employer] if available as patient has been there in the past. SNF work up started.   Expected Discharge Plan: Skilled Nursing Facility Barriers to Discharge: Continued Medical Work up   Patient Goals and CMS Choice Patient states their goals for this hospitalization and ongoing recovery are:: STR CMS Medicare.gov Compare Post Acute Care list provided to:: Patient Represenative (must comment) Choice offered to / list presented to : Adult Children, HC POA / Guardian      Expected Discharge Plan and Services       Living arrangements for the past 2 months: Single Family Home                                      Prior Living Arrangements/Services Living arrangements for the past 2 months: Single Family Home Lives with:: Self Patient language and need for interpreter reviewed:: Yes Do you feel safe going back to the place where you live?: Yes      Need for Family Participation in Patient Care: Yes  (Comment) Care giver support system in place?: Yes (comment) Current home services: DME Criminal Activity/Legal Involvement Pertinent to Current Situation/Hospitalization: No - Comment as needed  Activities of Daily Living      Permission Sought/Granted Permission sought to share information with : Facility Industrial/product designer granted to share information with : Yes, Verbal Permission Granted (by son)     Permission granted to share info w AGENCY: SNFs, Artist        Emotional Assessment       Orientation: : Fluctuating Orientation (Suspected and/or reported Sundowners) Alcohol / Substance Use: Not Applicable Psych Involvement: No (comment)  Admission diagnosis:  Fall Patient Active Problem List   Diagnosis Date Noted   Hospital discharge follow-up 08/17/2022   Acute respiratory failure with hypoxia (HCC) 08/04/2022   Pressure injury of skin 07/31/2022   Acute on chronic combined systolic and diastolic CHF (congestive heart failure) (HCC) 07/30/2022   Elevated troponin 07/30/2022   Iron deficiency anemia 06/21/2022   Community acquired pneumonia    CHF (congestive heart failure) (HCC) 06/15/2022   Pain of left hip joint 03/08/2022   History of falling 07/14/2021   COPD (chronic obstructive pulmonary disease) (HCC) 07/14/2021   Neurocognitive disorder with Lewy bodies (CODE) (HCC) 07/07/2021   Atherosclerosis of native arteries of the extremities with ulceration (HCC) 04/07/2021   PSVT (paroxysmal supraventricular tachycardia) (  HCC) 02/11/2021   Acute metabolic encephalopathy 02/10/2021   Frequent falls 02/10/2021   COVID-19 virus infection 02/10/2021   Chronic kidney disease, stage 3a (HCC) 02/10/2021   Dementia (HCC) 02/10/2021   Garbled speech, intermittent 02/10/2021   Laceration without foreign body, left ankle, subsequent encounter 02/10/2021   AMS (altered mental status) 02/10/2021   Osteoarthritis of left shoulder 10/27/2020   Type  2 diabetes mellitus with diabetic peripheral angiopathy without gangrene (HCC) 09/27/2020   Personal history of malignant melanoma of skin 09/27/2020   Long term (current) use of insulin (HCC) 09/27/2020   Coronary artery disease involving native coronary artery of native heart 04/17/2020   Celiac artery stenosis (HCC) 06/12/2019   Bilateral lower abdominal cramping 04/20/2019   UTI symptoms 04/20/2019   History of rectal bleeding 03/26/2019   DM type 2 with diabetic peripheral neuropathy (HCC) 12/14/2018   Arthropathy of lumbar facet joint 09/04/2018   Degeneration of lumbar intervertebral disc 09/04/2018   Spondylolisthesis, grade 1 09/04/2018   Lewy body dementia (HCC) 02/17/2018   PVD (peripheral vascular disease) (HCC) 08/16/2017   Pain in limb 07/27/2016   Hallucinations 07/07/2016   Chest pain 07/01/2016   Depression 06/23/2016   Hypokalemia 09/03/2015   Insomnia 07/23/2015   Headache 07/23/2015   Type 2 diabetes mellitus with vascular disease (HCC) 06/24/2015   Chronic vulvitis 06/02/2015   Allergic rhinitis 05/30/2015   Anemia 05/30/2015   Back ache 05/30/2015   Body mass index (BMI) of 29.0-29.9 in adult 05/30/2015   Edema 05/30/2015   Dilatation of esophagus 05/30/2015   Fall 05/30/2015   H/O deep venous thrombosis 05/30/2015   Hypercholesteremia 05/30/2015   Heart & renal disease, hypertensive, with heart failure (HCC) 05/30/2015   Hyponatremia 05/30/2015   Calculus of kidney 05/30/2015   Gonalgia 05/30/2015   Psoriasis 05/30/2015   Avitaminosis D 05/30/2015   Cough 04/01/2015   Combined fat and carbohydrate induced hyperlipemia 12/30/2014   Paroxysmal atrial fibrillation (HCC) 07/10/2014   Abnormal gait 07/08/2014   Anxiety state 07/08/2014   Chronic kidney disease 07/08/2014   Acid reflux 07/08/2014   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HCC) 07/08/2014   COPD exacerbation (HCC) 07/08/2014   Type II diabetes mellitus with renal manifestations (HCC) 07/08/2014    Benign essential HTN 06/26/2014   Carotid artery narrowing 06/26/2014   Myalgia 06/26/2014   Basal cell carcinoma of face 05/01/2014   Disease of female genital organs 11/28/2012   Incomplete bladder emptying 09/05/2012   Excessive urination at night 08/15/2012   Basal cell carcinoma of ear 10/26/2011   PCP:  Bosie Clos, MD Pharmacy:   [facility] - [place], [place] - [address] [place] [address] Phone: [phone] Fax: [phone]  [facility], [place], [place] - [address] [place] [address] [place] [address] Phone: [phone] Fax: [phone]     Social Determinants of Health (SDOH) Social History: SDOH Screenings   Food Insecurity: No Food Insecurity (05/19/2023)   Received from [facility]  Housing: Low Risk  (11/08/2022)  Transportation Needs: No Transportation Needs (05/19/2023)   Received from [facility]  Utilities: Not At Risk (05/19/2023)   Received from [facility]  Alcohol Screen: Low Risk  (05/20/2022)  Depression (PHQ2-9): Low Risk  (06/21/2022)  Financial Resource Strain: Low Risk  (05/19/2023)   [facility] from [facility]  Physical Activity: Inactive (05/19/2023)   Received from [facility]  Social Connections: Moderately Isolated (05/20/2022)  Stress: No Stress Concern Present (05/20/2022)  Tobacco Use: Low Risk  (07/23/2023)   SDOH Interventions:     Readmission Risk Interventions    08/02/2022    1:07 PM  Readmission Risk Prevention Plan  Transportation Screening Complete  Medication Review (RN Care Manager) Complete  PCP or Specialist appointment within 3-5 days of discharge Complete  HRI or Home Care Consult Complete  SW Recovery Care/Counseling Consult Complete  Palliative Care Screening Not Applicable  Skilled Nursing Facility Not Applicable

## 2023-07-24 NOTE — ED Notes (Signed)
Pt attempted to give her her medications, pt refused--she claims that the hospital is poisoning her.  She is eating her dinner, did refuse lunch but claims that the food is poisoned as well.  Otherwise, she is cooperative and pleasant thus far

## 2023-07-24 NOTE — ED Notes (Signed)
Breakfast tray provided to patient; patient able to feed self.

## 2023-07-24 NOTE — ED Notes (Signed)
Pt is resting in bed with eyes closed. Respirations are even and unlabored. No distress observed.

## 2023-07-24 NOTE — ED Notes (Signed)
Informed RN Casimiro Needle tele monitor shows AFIB

## 2023-07-24 NOTE — ED Provider Notes (Signed)
[facility] Provider Note   Event Date/Time   First MD Initiated Contact with Patient 07/23/23 [id number]     (approximate) History  Fall and Facial Injury  HPI [name] is a 89 y.o. female with a past medical history of hypertension, hyperlipidemia, and Lewy body dementia as well as type 2 diabetes and paroxysmal A-fib who presents complaining of a fall suffering facial trauma.  Patient states that she is having bleeding from her nose but that this fall was mechanical in nature.  Patient is alert and oriented x 4 however she does have a persistent delusion that one of her childhood boyfriends is stalking her.  Patient continues to be fearful of this person.  Further history was obtained from patient's son who states that she has been declining over the last few months including a recommendation by patient's neurologist that she is "not safe at home anymore".  Son also states the patient has had multiple falls in the home over the last few months and this is due to patient believing that she is more agile than she actually is.  Son is very concerned the patient is at home by herself after having a recommendation from her neurologist that she is no longer safe. ROS: Patient currently denies any vision changes, tinnitus, difficulty speaking, facial droop, sore throat, chest pain, shortness of breath, abdominal pain, nausea/vomiting/diarrhea, dysuria, or weakness/numbness/paresthesias in any extremity   Physical Exam  Triage Vital Signs: ED Triage Vitals  Encounter Vitals Group     BP 07/23/23 0530 (!) 159/54     Systolic BP Percentile --      Diastolic BP Percentile --      Pulse Rate 07/23/23 0530 81     Resp 07/23/23 0540 18     Temp 07/23/23 0530 97.9 F (36.6 C)     Temp Source 07/23/23 0530 Oral     SpO2 07/23/23 0530 98 %     Weight 07/23/23 0538 130 lb 1.1 oz (59 kg)     Height 07/23/23 0538 5\' 2"  (1.575 m)     Head Circumference --      Peak Flow --       Pain Score 07/23/23 0538 0     Pain Loc --      Pain Education --      Exclude from Growth Chart --    Most recent vital signs: Vitals:   07/24/23 0530 07/24/23 0600  BP: (!) 104/43 (!) 145/54  Pulse: 71 80  Resp: (!) 21 10  Temp:    SpO2: 99% 99%   General: Awake, oriented x4. CV:  Good peripheral perfusion.  Resp:  Normal effort.  Abd:  No distention.  Other:  Elderly well-developed, well-nourished Caucasian female resting comfortably in no acute distress.  Bilateral periorbital ecchymosis, nasal bridge deformity ED Results / Procedures / Treatments  Labs (all labs ordered are listed, but only abnormal results are displayed) Labs Reviewed  CBC WITH DIFFERENTIAL/PLATELET - Abnormal; Notable for the following components:      Result Value   Eosinophils Absolute 0.6 (*)    All other components within normal limits  COMPREHENSIVE METABOLIC PANEL - Abnormal; Notable for the following components:   Potassium 3.2 (*)    Chloride 97 (*)    Glucose, Bld 461 (*)    Calcium 8.4 (*)    Albumin 3.3 (*)    GFR, Estimated 58 (*)    All other components within normal limits  URINALYSIS,  ROUTINE W REFLEX MICROSCOPIC - Abnormal; Notable for the following components:   Color, Urine YELLOW (*)    APPearance CLEAR (*)    Glucose, UA >=500 (*)    Protein, ur 100 (*)    Leukocytes,Ua TRACE (*)    All other components within normal limits  HEMOGLOBIN A1C - Abnormal; Notable for the following components:   Hgb A1c MFr Bld 8.4 (*)    All other components within normal limits  CBG MONITORING, ED - Abnormal; Notable for the following components:   Glucose-Capillary 351 (*)    All other components within normal limits  CBG MONITORING, ED - Abnormal; Notable for the following components:   Glucose-Capillary 178 (*)    All other components within normal limits  CK  TROPONIN I (HIGH SENSITIVITY)   EKG ED ECG REPORT I, Merwyn Katos, the attending physician, personally viewed and  interpreted this ECG. Date: 07/24/2023 EKG Time: 0539 Rate: 78 Rhythm: normal sinus rhythm QRS Axis: normal Intervals: normal ST/T Wave abnormalities: normal Narrative Interpretation: no evidence of acute ischemia RADIOLOGY ED MD interpretation: CT of the head without contrast interpreted by me shows no evidence of acute abnormalities including no intracerebral hemorrhage, obvious masses, or significant edema  CT of the cervical spine interpreted by me does not show any evidence of acute abnormalities including no acute fracture, malalignment, height loss, or dislocation  CT of the maxillofacial structures shows minimally displaced bilateral nasal bone fractures -Agree with radiology assessment Official radiology report(s): No results found. PROCEDURES: Critical Care performed: No .1-3 Lead EKG Interpretation  Performed by: Merwyn Katos, MD Authorized by: Merwyn Katos, MD     Interpretation: normal     ECG rate:  71   ECG rate assessment: normal     Rhythm: sinus rhythm     Ectopy: none     Conduction: normal    MEDICATIONS ORDERED IN ED: Medications  aspirin EC tablet 325 mg (325 mg Oral Given 07/23/23 1426)  atorvastatin (LIPITOR) tablet 10 mg (10 mg Oral Given 07/23/23 1426)  carvedilol (COREG) tablet 3.125 mg (3.125 mg Oral Not Given 07/23/23 2125)  cephALEXin (KEFLEX) capsule 500 mg (500 mg Oral Given 07/23/23 2124)  clopidogrel (PLAVIX) tablet 75 mg (75 mg Oral Given 07/23/23 1429)  Dupilumab SOAJ 300 mg (300 mg Subcutaneous Not Given 07/23/23 1441)  sacubitril-valsartan (ENTRESTO) 49-51 mg per tablet (1 tablet Oral Not Given 07/23/23 2126)  ezetimibe (ZETIA) tablet 10 mg (10 mg Oral Given 07/23/23 1429)  loratadine (CLARITIN) tablet 10 mg (10 mg Oral Given 07/23/23 1428)  FLUoxetine (PROZAC) capsule 10 mg (10 mg Oral Given 07/23/23 2125)  insulin lispro (HUMALOG) KwikPen 12-15 Units ( Subcutaneous Not Given 07/23/23 2135)  insulin glargine-yfgn (SEMGLEE)  injection 12 Units (12 Units Subcutaneous Given 07/23/23 1440)  isosorbide mononitrate (IMDUR) 24 hr tablet 30 mg (30 mg Oral Given 07/23/23 1428)  pantoprazole (PROTONIX) EC tablet 20 mg (20 mg Oral Given 07/23/23 1433)  memantine (NAMENDA) tablet 10 mg (10 mg Oral Given 07/23/23 2124)  QUEtiapine (SEROQUEL) tablet 50 mg (50 mg Oral Given 07/23/23 2124)  mirabegron ER (MYRBETRIQ) tablet 25 mg (25 mg Oral Given 07/23/23 1434)  vitamin E capsule 1,000 Units (1,000 Units Oral Given 07/23/23 1430)  insulin aspart (novoLOG) injection 0-9 Units (9 Units Subcutaneous Given 07/23/23 1725)  oxymetazoline (AFRIN) 0.05 % nasal spray 1 spray (1 spray Each Nare Given 07/23/23 0740)  carvedilol (COREG) tablet 3.125 mg (3.125 mg Oral Given 07/23/23 0807)  lactated ringers  bolus 1,000 mL (0 mLs Intravenous Stopped 07/24/23 0102)   IMPRESSION / MDM / ASSESSMENT AND PLAN / ED COURSE  I reviewed the triage vital signs and the nursing notes.                             The patient is on the cardiac monitor to evaluate for evidence of arrhythmia and/or significant heart rate changes. Patient's presentation is most consistent with acute presentation with potential threat to life or bodily function. Presenting after a fall that occurred just prior to arrival, resulting in injury to the nose. The mechanism of injury was a mechanical ground level fall without syncope or near-syncope. The current level of pain is moderate. There was no loss of consciousness, confusion, seizure, or memory impairment. There is not a laceration associated with the injury. Denies neck pain. The patient does not take blood thinner medications.  She does take aspirin and Plavix. Denies vomiting, numbness/weakness, fever  Dispo: Given patient's instability at home, I do not feel that it is safe for patient to be discharged home at this time.  Therefore patient will require evaluation by physical and Occupational Therapy for likely  placement or home health.     FINAL CLINICAL IMPRESSION(S) / ED DIAGNOSES   Final diagnoses:  Fall, initial encounter  Contusion of face, initial encounter  Closed fracture of nasal bone, initial encounter   Rx / DC Orders   ED Discharge Orders     None      Note:  This document was prepared using Dragon voice recognition software and may include unintentional dictation errors.   Merwyn Katos, MD 07/24/23 [phone]
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
## 2023-07-24 NOTE — ED Notes (Signed)
Made RN aware pts bp is 83/44 per tele monitor.
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
## 2023-07-24 NOTE — ED Notes (Signed)
Pt resting in bed comfortably. Pt is breathing easy and unlabored.

## 2023-07-24 NOTE — ED Notes (Signed)
PT at bedside.

## 2023-07-24 NOTE — ED Notes (Signed)
Pt HR 107 nurse Morrie Sheldon is in the Room.

## 2023-07-24 NOTE — ED Notes (Signed)
Notified Nurse Morrie Sheldon of irregular HR is aware
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
## 2023-07-24 NOTE — ED Notes (Signed)
Pt is resting comfortably in bed. Respiration are even and unlabored.

## 2023-07-24 NOTE — ED Notes (Addendum)
Pt is resting in bed with eyeys closed. No distress observed. Bp map has been in the 50's and 60's. Dr. Katrinka Blazing was notified. Order to give 1liter bolus of LR.
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
## 2023-07-24 NOTE — ED Notes (Signed)
ED tech at bedside to give bath.

## 2023-07-24 NOTE — ED Notes (Signed)
Family at bedside. 

## 2023-07-24 NOTE — [place] FL2 (Signed)
[place] MEDICAID FL2 LEVEL OF CARE FORM     IDENTIFICATION  Patient Name: [name] Birthdate: [birth year] Sex: female Admission Date (Current Location): 07/23/2023  [place] and [place] Number:  Chiropodist and Address:  [facility], [address], [place], [place] [address]      Provider Number: [id number]  Attending Physician Name and Address:  No att. providers found  Relative Name and Phone Number:  Zywicki,James (Son)  [phone] (Mobile)    Current Level of Care: Hospital Recommended Level of Care: Skilled Nursing Facility Prior Approval Number:    Date Approved/Denied:   PASRR Number: [id number]  Discharge Plan:      Current Diagnoses: Patient Active Problem List   Diagnosis Date Noted   Hospital discharge follow-up 08/17/2022   Acute respiratory failure with hypoxia (HCC) 08/04/2022   Pressure injury of skin 07/31/2022   Acute on chronic combined systolic and diastolic CHF (congestive heart failure) (HCC) 07/30/2022   Elevated troponin 07/30/2022   Iron deficiency anemia 06/21/2022   Community acquired pneumonia    CHF (congestive heart failure) (HCC) 06/15/2022   Pain of left hip joint 03/08/2022   History of falling 07/14/2021   COPD (chronic obstructive pulmonary disease) (HCC) 07/14/2021   Neurocognitive disorder with Lewy bodies (CODE) (HCC) 07/07/2021   Atherosclerosis of native arteries of the extremities with ulceration (HCC) 04/07/2021   PSVT (paroxysmal supraventricular tachycardia) (HCC) 02/11/2021   Acute metabolic encephalopathy 02/10/2021   Frequent falls 02/10/2021   COVID-19 virus infection 02/10/2021   Chronic kidney disease, stage 3a (HCC) 02/10/2021   Dementia (HCC) 02/10/2021   Garbled speech, intermittent 02/10/2021   Laceration without foreign body, left ankle, subsequent encounter 02/10/2021   AMS (altered mental status) 02/10/2021   Osteoarthritis of left shoulder 10/27/2020    Type 2 diabetes mellitus with diabetic peripheral angiopathy without gangrene (HCC) 09/27/2020   Personal history of malignant melanoma of skin 09/27/2020   Long term (current) use of insulin (HCC) 09/27/2020   Coronary artery disease involving native coronary artery of native heart 04/17/2020   Celiac artery stenosis (HCC) 06/12/2019   Bilateral lower abdominal cramping 04/20/2019   UTI symptoms 04/20/2019   History of rectal bleeding 03/26/2019   DM type 2 with diabetic peripheral neuropathy (HCC) 12/14/2018   Arthropathy of lumbar facet joint 09/04/2018   Degeneration of lumbar intervertebral disc 09/04/2018   Spondylolisthesis, grade 1 09/04/2018   Lewy body dementia (HCC) 02/17/2018   PVD (peripheral vascular disease) (HCC) 08/16/2017   Pain in limb 07/27/2016   Hallucinations 07/07/2016   Chest pain 07/01/2016   Depression 06/23/2016   Hypokalemia 09/03/2015   Insomnia 07/23/2015   Headache 07/23/2015   Type 2 diabetes mellitus with vascular disease (HCC) 06/24/2015   Chronic vulvitis 06/02/2015   Allergic rhinitis 05/30/2015   Anemia 05/30/2015   Back ache 05/30/2015   Body mass index (BMI) of 29.0-29.9 in adult 05/30/2015   Edema 05/30/2015   Dilatation of esophagus 05/30/2015   Fall 05/30/2015   H/O deep venous thrombosis 05/30/2015   Hypercholesteremia 05/30/2015   Heart & renal disease, hypertensive, with heart failure (HCC) 05/30/2015   Hyponatremia 05/30/2015   Calculus of kidney 05/30/2015   Gonalgia 05/30/2015   Psoriasis 05/30/2015   Avitaminosis D 05/30/2015   Cough 04/01/2015   Combined fat and carbohydrate induced hyperlipemia 12/30/2014   Paroxysmal atrial fibrillation (HCC) 07/10/2014   Abnormal gait 07/08/2014   Anxiety state 07/08/2014   Chronic kidney disease 07/08/2014  Acid reflux 07/08/2014   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HCC) 07/08/2014   COPD exacerbation (HCC) 07/08/2014   Type II diabetes mellitus with renal manifestations (HCC)  07/08/2014   Benign essential HTN 06/26/2014   Carotid artery narrowing 06/26/2014   Myalgia 06/26/2014   Basal cell carcinoma of face 05/01/2014   Disease of female genital organs 11/28/2012   Incomplete bladder emptying 09/05/2012   Excessive urination at night 08/15/2012   Basal cell carcinoma of ear 10/26/2011    Orientation RESPIRATION BLADDER Height & Weight     Self, Time, Place  Normal Continent Weight: 130 lb 1.1 oz (59 kg) Height:  5\' 2"  (157.5 cm)  BEHAVIORAL SYMPTOMS/MOOD NEUROLOGICAL BOWEL NUTRITION STATUS        Diet (regular)  AMBULATORY STATUS COMMUNICATION OF NEEDS Skin   Limited Assist Verbally Normal                       Personal Care Assistance Level of Assistance  Bathing, Feeding, Dressing Bathing Assistance: Limited assistance Feeding assistance: Limited assistance Dressing Assistance: Limited assistance     Functional Limitations Info             SPECIAL CARE FACTORS FREQUENCY  PT (By licensed PT), OT (By licensed OT)     PT Frequency: 5 times per week OT Frequency: 5 times per week            Contractures      Additional Factors Info  Code Status, Allergies   Allergies Info: Bacitracin-neomycin-polymyxin, Neomycin-bacitracin Zn-polymyx, Latex, Lidocaine, Aricept (Donepezil Hcl), Benzalkonium Chloride, Ibuprofen, Lidocaine Hcl, Valdecoxib, Albuterol, Tape, Triamcinolone           Current Medications (07/24/2023):  This is the current hospital active medication list Current Facility-Administered Medications  Medication Dose Route Frequency Provider Last Rate Last Admin   aspirin EC tablet 325 mg  325 mg Oral Daily Merwyn Katos, MD   325 mg at 07/24/23 0943   atorvastatin (LIPITOR) tablet 10 mg  10 mg Oral Daily Merwyn Katos, MD   10 mg at 07/24/23 0944   carvedilol (COREG) tablet 3.125 mg  3.125 mg Oral BID Merwyn Katos, MD   3.125 mg at 07/24/23 0944   cephALEXin (KEFLEX) capsule 500 mg  500 mg Oral BID Merwyn Katos, MD   500 mg at 07/24/23 0944   clopidogrel (PLAVIX) tablet 75 mg  75 mg Oral Daily Merwyn Katos, MD   75 mg at 07/24/23 0944   Dupilumab SOAJ 300 mg  300 mg Subcutaneous Q14 Days Merwyn Katos, MD       ezetimibe (ZETIA) tablet 10 mg  10 mg Oral Daily Merwyn Katos, MD   10 mg at 07/24/23 0943   FLUoxetine (PROZAC) capsule 10 mg  10 mg Oral TID Merwyn Katos, MD   10 mg at 07/24/23 0942   insulin aspart (novoLOG) injection 0-9 Units  0-9 Units Subcutaneous TID WC Merwyn Katos, MD   1 Units at 07/24/23 1149   insulin glargine-yfgn (SEMGLEE) injection 12 Units  12 Units Subcutaneous Daily Merwyn Katos, MD   12 Units at 07/24/23 0941   isosorbide mononitrate (IMDUR) 24 hr tablet 30 mg  30 mg Oral Daily Merwyn Katos, MD   30 mg at 07/24/23 0943   loratadine (CLARITIN) tablet 10 mg  10 mg Oral Daily Merwyn Katos, MD   10 mg at 07/24/23 0944   memantine (NAMENDA) tablet 10  mg  10 mg Oral BID Merwyn Katos, MD   10 mg at 07/24/23 0943   mirabegron ER (MYRBETRIQ) tablet 25 mg  25 mg Oral Daily Merwyn Katos, MD   25 mg at 07/24/23 0942   pantoprazole (PROTONIX) EC tablet 20 mg  20 mg Oral Daily Merwyn Katos, MD   20 mg at 07/24/23 0942   QUEtiapine (SEROQUEL) tablet 50 mg  50 mg Oral QHS Merwyn Katos, MD   50 mg at 07/23/23 2124   sacubitril-valsartan (ENTRESTO) 49-51 mg per tablet  1 tablet Oral BID Merwyn Katos, MD   1 tablet at 07/24/23 1610   vitamin E capsule 1,000 Units  1,000 Units Oral Daily Merwyn Katos, MD   1,000 Units at 07/24/23 1045   Current Outpatient Medications  Medication Sig Dispense Refill   atorvastatin (LIPITOR) 10 MG tablet TAKE 1 TABLET BY MOUTH DAILY 90 tablet 0   carvedilol (COREG) 3.125 MG tablet Take 1 tablet (3.125 mg total) by mouth 2 (two) times daily. 180 tablet 3   cephALEXin (KEFLEX) 500 MG capsule Take 1 capsule (500 mg total) by mouth 2 (two) times daily for 7 days. 14 capsule 0   clopidogrel (PLAVIX) 75 MG tablet Take 1  tablet (75 mg total) by mouth daily. 30 tablet 3   Dupilumab (DUPIXENT) 300 MG/2ML SOPN Inject 300 mg into the skin every 14 (fourteen) days. Starting at day 15 for maintenance. 4 mL 5   ENTRESTO 49-51 MG TAKE ONE TABLET BY MOUTH TWICE DAILY 60 tablet 5   ezetimibe (ZETIA) 10 MG tablet TAKE 1 TABLET BY MOUTH DAILY 90 tablet 1   FLUoxetine (PROZAC) 10 MG capsule TAKE 1 CAPSULE BY MOUTH 3 TIMES DAILY. 90 capsule 3   HUMALOG KWIKPEN 100 UNIT/ML KwikPen Inject 3 Units into the skin 3 (three) times daily. (Patient taking differently: Inject 12-15 Units into the skin 3 (three) times daily. 12 units with breakfast, 15 units with lunch, and 15 units with dinner) 15 mL 11   insulin glargine (LANTUS) 100 UNIT/ML injection Inject 0.06 mLs (6 Units total) into the skin at bedtime. (Patient taking differently: Inject 12 Units into the skin at bedtime.) 10 mL 11   isosorbide mononitrate (IMDUR) 30 MG 24 hr tablet Take 1 tablet (30 mg total) by mouth daily. Take 30 mg by mouth daily. 30 tablet 0   lansoprazole (PREVACID) 30 MG capsule TAKE 1 CAPSULE BY MOUTH ONCE DAILY AT NOON (Patient taking differently: Take 30 mg by mouth daily at 12 noon.) 90 capsule 1   memantine (NAMENDA) 10 MG tablet Take 10 mg by mouth 2 (two) times daily.     QUEtiapine (SEROQUEL) 50 MG tablet Take 50 mg by mouth at bedtime.     Vibegron (GEMTESA) 75 MG TABS Take 1 tablet (75 mg total) by mouth daily. 30 tablet 11   aspirin EC 325 MG tablet Take 325 mg by mouth daily.     fexofenadine (ALLEGRA) 180 MG tablet Take 180 mg by mouth daily.     glucose blood test strip ACCU-CHEK AVIVA PLUS TEST STRP Use to check sugars 3 times daily.     Multiple Vitamins-Minerals (ICAPS AREDS 2 PO) Take 2 capsules by mouth daily.     vitamin E 1000 UNIT capsule Take 1,000 Units by mouth daily.       Discharge Medications: Please see discharge summary for a list of discharge medications.  Relevant Imaging Results:  Relevant Lab Results:  Additional  Information SS #: [id number]  Izzie Geers E Gardenia Witter, LCSW

## 2023-07-25 ENCOUNTER — Other Ambulatory Visit: Payer: Medicare Other | Admitting: Urology

## 2023-07-25 DIAGNOSIS — M47812 Spondylosis without myelopathy or radiculopathy, cervical region: Secondary | ICD-10-CM | POA: Diagnosis not present

## 2023-07-25 DIAGNOSIS — S022XXA Fracture of nasal bones, initial encounter for closed fracture: Secondary | ICD-10-CM | POA: Diagnosis not present

## 2023-07-25 DIAGNOSIS — S0012XA Contusion of left eyelid and periocular area, initial encounter: Secondary | ICD-10-CM | POA: Diagnosis not present

## 2023-07-25 DIAGNOSIS — I6782 Cerebral ischemia: Secondary | ICD-10-CM | POA: Diagnosis not present

## 2023-07-25 DIAGNOSIS — I10 Essential (primary) hypertension: Secondary | ICD-10-CM | POA: Diagnosis not present

## 2023-07-25 DIAGNOSIS — E785 Hyperlipidemia, unspecified: Secondary | ICD-10-CM | POA: Diagnosis not present

## 2023-07-25 DIAGNOSIS — S0083XA Contusion of other part of head, initial encounter: Secondary | ICD-10-CM | POA: Diagnosis not present

## 2023-07-25 DIAGNOSIS — M6281 Muscle weakness (generalized): Secondary | ICD-10-CM | POA: Diagnosis not present

## 2023-07-25 DIAGNOSIS — R2681 Unsteadiness on feet: Secondary | ICD-10-CM | POA: Diagnosis not present

## 2023-07-25 DIAGNOSIS — M5030 Other cervical disc degeneration, unspecified cervical region: Secondary | ICD-10-CM | POA: Diagnosis not present

## 2023-07-25 DIAGNOSIS — E119 Type 2 diabetes mellitus without complications: Secondary | ICD-10-CM | POA: Diagnosis not present

## 2023-07-25 DIAGNOSIS — I480 Paroxysmal atrial fibrillation: Secondary | ICD-10-CM | POA: Diagnosis not present

## 2023-07-25 DIAGNOSIS — S0011XA Contusion of right eyelid and periocular area, initial encounter: Secondary | ICD-10-CM | POA: Diagnosis not present

## 2023-07-25 DIAGNOSIS — R296 Repeated falls: Secondary | ICD-10-CM | POA: Diagnosis not present

## 2023-07-25 LAB — CBG MONITORING, ED
Glucose-Capillary: 82 mg/dL (ref 70–99)
Glucose-Capillary: 250 mg/dL — ABNORMAL HIGH (ref 70–99)
Glucose-Capillary: 405 mg/dL — ABNORMAL HIGH (ref 70–99)
Glucose-Capillary: 321 mg/dL — ABNORMAL HIGH (ref 70–99)
Glucose-Capillary: 204 mg/dL — ABNORMAL HIGH (ref 70–99)

## 2023-07-25 MED ORDER — HALOPERIDOL LACTATE 5 MG/ML IJ SOLN
5.0000 mg | Freq: Once | INTRAMUSCULAR | Status: DC
  Filled 2023-07-25: qty 1

## 2023-07-25 MED ORDER — HALOPERIDOL 5 MG PO TABS
5.0000 mg | ORAL_TABLET | Freq: Once | ORAL | Status: AC
  Administered 2023-07-25: 5 mg via ORAL
  Filled 2023-07-25: qty 1

## 2023-07-25 NOTE — ED Provider Notes (Signed)
Procedures     ----------------------------------------- 12:09 PM on 07/25/2023 -----------------------------------------  Pt took haldol PO voluntarily, did not require manual hold physical restraint.     Sharman Cheek, MD 07/25/23 1210

## 2023-07-25 NOTE — ED Notes (Signed)
Pt calling out wanting to get out of bed. Pt asking to sit in chair, chair placed next to bed and call light placed next to pt. Pt advised to use call light when wanting to get up for safety reasons, pt understood and agreed to this plan.

## 2023-07-25 NOTE — ED Provider Notes (Signed)
-----------------------------------------   5:23 AM on 07/25/2023 -----------------------------------------   Blood pressure (!) 173/66, pulse 88, temperature 97.6 F (36.4 C), temperature source Oral, resp. rate 17, height 1.575 m (5\' 2" ), weight 59 kg, SpO2 97%.  The patient is calm and cooperative at this time.  There have been no acute events since the last update.  Awaiting disposition plan from [facility] team.   Loleta Rose, MD 07/25/23 [phone]

## 2023-07-25 NOTE — ED Notes (Signed)
Pt heard talking in room to someone, nobody currently in room

## 2023-07-25 NOTE — ED Notes (Signed)
Pt changed herself back into her clothes she arrived in and states she wants to leave and go back home. Pt began to walk out of her room and down the hall with her walker, insisting that she is leaving today. This RN instructed pt to sit in hallway chair and call to talk to her son on the phone with hopes that it would calm her down. Pt spoke with son and then daughter on the phone. Pt given oral haldol and peanut butter crackers. Pt is more calm now, still sitting in hallway. Refuses to put gown back on.

## 2023-07-25 NOTE — ED Notes (Signed)
Pt given graham crackers and grape juice.

## 2023-07-25 NOTE — ED Notes (Signed)
Pt sleeping. 

## 2023-07-25 NOTE — ED Notes (Signed)
Pt sleepy, does not want to eat at this time

## 2023-07-25 NOTE — ED Notes (Signed)
Fsbs 405

## 2023-07-25 NOTE — ED Notes (Signed)
Introduced to pt by previous [employer], bedside report given.

## 2023-07-25 NOTE — ED Notes (Signed)
Fsbs 204

## 2023-07-25 NOTE — ED Notes (Signed)
Pt refusing her morning meds, stating "Ill just take it when I get home, I'm leaving." Pt informed the dr has not put her up for DC yet so she may not leave today. "Pt became irritable stating "my son is sitting outside waiting for me, I'm leaving tonight!" This RN told pt I have not received a call saying he is waiting but will listen out for a call. Pt then informed this RN she is not eating breakfast and the drink I brought, opened and poured in front of pt tasted funny, "like pills were put in it" pt told nothing placed in drink, reminded I opened and poured in front of her. Asked again if pt would like meds, pt refusing.

## 2023-07-25 NOTE — ED Notes (Signed)
Fsbs 321.  Pt did not eat dinner meal.  Pt states she is not hungry.  Pt sleeping, easily aroused.

## 2023-07-25 NOTE — ED Notes (Signed)
Pt missed all meds this am.  Fsbs 405 now.  Dr Fuller Plan aware.  Meds given.

## 2023-07-25 NOTE — ED Notes (Signed)
Pt informed this RN would like to check sugar, pt in agreement with this plan.

## 2023-07-25 NOTE — ED Notes (Signed)
Pt refused multiple times to allow this nurse to check a cbg or to look over her skin for a head to toe assessment

## 2023-07-25 NOTE — ED Notes (Signed)
Pt informed breakfast should arrive soon. Pt responded saying "I don't eat anything they bring, they're trying to poison me." This RN attempted to reassure pt there is no poison in the food, pt refusing to believe this RN.

## 2023-07-25 NOTE — ED Notes (Signed)
Pt agreed to let this RN get vital signs, after vitals obtained pt again stated "I'm not taking any pills though, I will take them when I get home."

## 2023-07-25 NOTE — ED Notes (Signed)
Pt accepted one cup of vanilla ice cream and a cup of ice water from this nurse

## 2023-07-25 NOTE — TOC Progression Note (Signed)
Transition of Care [facility]) - Progression Note    Patient Details  Name: [name] MRN: [id number] Date of Birth: 20-Aug-1929  Transition [facility]) CM/SW Contact  Margarito Liner, LCSW Phone Number: 07/25/2023, 11:51 AM  Clinical Narrative:   [facility] declined. [facility] and [facility] have not responded yet. CSW sent updated clinicals to try and trigger responses.  Expected Discharge Plan: Skilled Nursing Facility Barriers to Discharge: Continued Medical Work up  Expected Discharge Plan and Services       Living arrangements for the past 2 months: Single Family Home                                       Social Determinants of Health (SDOH) Interventions SDOH Screenings   Food Insecurity: No Food Insecurity (05/19/2023)   Received from [facility]  Housing: Low Risk  (11/08/2022)  Transportation Needs: No Transportation Needs (05/19/2023)   Received from [facility]  Utilities: Not At Risk (05/19/2023)   Received from [facility]  Alcohol Screen: Low Risk  (05/20/2022)  Depression (PHQ2-9): Low Risk  (06/21/2022)  Financial Resource Strain: Low Risk  (05/19/2023)   Received from [facility]  Physical Activity: Inactive (05/19/2023)   Received from [facility]  Social Connections: Moderately Isolated (05/20/2022)  Stress: No Stress Concern Present (05/20/2022)  Tobacco Use: Low Risk  (07/23/2023)    Readmission Risk Interventions    08/02/2022    1:07 PM  Readmission Risk Prevention Plan  Transportation Screening Complete  Medication Review (RN Care Manager) Complete  PCP or Specialist appointment within 3-5 days of discharge Complete  HRI or Home Care Consult Complete  SW Recovery Care/Counseling Consult Complete  Palliative Care Screening Not Applicable  Skilled Nursing Facility Not Applicable

## 2023-07-26 DIAGNOSIS — E785 Hyperlipidemia, unspecified: Secondary | ICD-10-CM | POA: Diagnosis not present

## 2023-07-26 DIAGNOSIS — I6782 Cerebral ischemia: Secondary | ICD-10-CM | POA: Diagnosis not present

## 2023-07-26 DIAGNOSIS — I10 Essential (primary) hypertension: Secondary | ICD-10-CM | POA: Diagnosis not present

## 2023-07-26 DIAGNOSIS — M6281 Muscle weakness (generalized): Secondary | ICD-10-CM | POA: Diagnosis not present

## 2023-07-26 DIAGNOSIS — R296 Repeated falls: Secondary | ICD-10-CM | POA: Diagnosis not present

## 2023-07-26 DIAGNOSIS — M5030 Other cervical disc degeneration, unspecified cervical region: Secondary | ICD-10-CM | POA: Diagnosis not present

## 2023-07-26 DIAGNOSIS — S0083XA Contusion of other part of head, initial encounter: Secondary | ICD-10-CM | POA: Diagnosis not present

## 2023-07-26 DIAGNOSIS — S022XXA Fracture of nasal bones, initial encounter for closed fracture: Secondary | ICD-10-CM | POA: Diagnosis not present

## 2023-07-26 DIAGNOSIS — I480 Paroxysmal atrial fibrillation: Secondary | ICD-10-CM | POA: Diagnosis not present

## 2023-07-26 DIAGNOSIS — E119 Type 2 diabetes mellitus without complications: Secondary | ICD-10-CM | POA: Diagnosis not present

## 2023-07-26 DIAGNOSIS — R2681 Unsteadiness on feet: Secondary | ICD-10-CM | POA: Diagnosis not present

## 2023-07-26 DIAGNOSIS — S0011XA Contusion of right eyelid and periocular area, initial encounter: Secondary | ICD-10-CM | POA: Diagnosis not present

## 2023-07-26 DIAGNOSIS — S0012XA Contusion of left eyelid and periocular area, initial encounter: Secondary | ICD-10-CM | POA: Diagnosis not present

## 2023-07-26 DIAGNOSIS — M47812 Spondylosis without myelopathy or radiculopathy, cervical region: Secondary | ICD-10-CM | POA: Diagnosis not present

## 2023-07-26 LAB — CBG MONITORING, ED
Glucose-Capillary: 107 mg/dL — ABNORMAL HIGH (ref 70–99)
Glucose-Capillary: 244 mg/dL — ABNORMAL HIGH (ref 70–99)
Glucose-Capillary: 257 mg/dL — ABNORMAL HIGH (ref 70–99)

## 2023-07-26 MED ORDER — INSULIN ASPART 100 UNIT/ML IJ SOLN
8.0000 [IU] | Freq: Three times a day (TID) | INTRAMUSCULAR | Status: DC
Start: 2023-07-26 — End: 2023-08-07
  Administered 2023-07-26 – 2023-08-07 (×18): 8 [IU] via SUBCUTANEOUS
  Filled 2023-07-26 (×19): qty 1

## 2023-07-26 NOTE — ED Notes (Signed)
Pt assisted to bathroom with a walker by this RN.

## 2023-07-26 NOTE — ED Provider Notes (Signed)
Emergency Medicine Observation Re-evaluation Note  Physical Exam   BP (!) 113/50 (BP Location: Right Arm)   Pulse 77   Temp 98.3 F (36.8 C) (Oral)   Resp 18   Ht 5\' 2"  (1.575 m)   Wt 59 kg   SpO2 98%   BMI 23.79 kg/m   Patient appears in no acute distress.  ED Course / MDM   No reported events during my shift at the time of this note.   Pt is awaiting dispo from consultants   Pilar Jarvis MD    Pilar Jarvis, MD 07/26/23 [phone]

## 2023-07-26 NOTE — Progress Notes (Signed)
Occupational Therapy Treatment Patient Details Name: [name] MRN: [id number] DOB: 23-Feb-1929 Today's Date: 07/26/2023   History of present illness Pt is a 89 y/o F who presented to the hospital after a fall & suffering facial trauma. Pt's family reports pt's cognition has be declining over the past few months & pt has had multiple falls.  CT shows minimally displaced B nasal bone fxs. PMH: HTN, HLD, Lewy body dementia, DM2, paroxysmal a-fib   OT comments  Pt participating in ADLs and mobility during session today; see below for details. Pleasant and cooperative throughout session. Patient will benefit from continued OT while in acute care.      If plan is discharge home, recommend the following:  A little help with walking and/or transfers;A little help with bathing/dressing/bathroom;Assistance with cooking/housework;Assist for transportation;Help with stairs or ramp for entrance;Supervision due to cognitive status   Equipment Recommendations  Other (comment) (defer)    Recommendations for Other Services      Precautions / Restrictions Precautions Precautions: Fall Restrictions Weight Bearing Restrictions: No       Mobility Bed Mobility Overal bed mobility: Modified Independent (with HOB raised)             General bed mobility comments: Able to t/f to sitting MOD (I) with HOB raised. Good sitting balance at EOB    Transfers Overall transfer level: Needs assistance Equipment used: Rolling walker (2 wheels) Transfers: Sit to/from Stand Sit to Stand: Contact guard assist           General transfer comment: two sit to stands from EOB during session; pt also walked approx 60 feet in hallway with OT CGA with RW.     Balance Overall balance assessment: Needs assistance Sitting-balance support: Feet supported, No upper extremity supported Sitting balance-Leahy Scale: Good     Standing balance support: Bilateral upper extremity supported, During  functional activity Standing balance-Leahy Scale: Fair                             ADL either performed or assessed with clinical judgement   ADL Overall ADL's : Needs assistance/impaired Eating/Feeding: Set up;Supervision/ safety;Cueing for compensatory techinques;Sitting Eating/Feeding Details (indicate cue type and reason): seated EOB; pt with low vision Grooming: Wash/dry hands;Oral care;Minimal assistance;Standing Grooming Details (indicate cue type and reason): MIN A due to low vision; standing at sink                 Toilet Transfer: Contact guard assist;Regular Toilet;Grab bars Toilet Transfer Details (indicate cue type and reason): toilet in ED room Toileting- Clothing Manipulation and Hygiene: Set up;Supervision/safety;Sit to/from stand Toileting - Clothing Manipulation Details (indicate cue type and reason): urine hygiene with set up     Functional mobility during ADLs: Supervision/safety;Set up;Contact guard assist;Rolling walker (2 wheels) General ADL Comments: cues during mobility 2/2 visual impairment.    Extremity/Trunk Assessment Upper Extremity Assessment Upper Extremity Assessment: Overall WFL for tasks assessed   Lower Extremity Assessment Lower Extremity Assessment: Overall WFL for tasks assessed        Vision   Additional Comments: Pt stating she has poor vision. Needs assist to find items on plate, at sink, etc.   Perception     Praxis      Cognition Arousal: Alert Behavior During Therapy: [facility] for tasks assessed/performed Overall Cognitive Status: History of cognitive impairments - at baseline  General Comments: Oriented to person, place; thinks it is Wednesday; when told it is Tuesday, pt states "wow, it was Tuesday yesterday too." Able to tel OT about her kids and her first and second husbands.        Exercises      Shoulder Instructions       General Comments On room air.  Pleasant during session today. Wanting to see her children, but not distressed.    Pertinent Vitals/ Pain       Pain Assessment Pain Assessment: No/denies pain  Home Living                                          Prior Functioning/Environment              Frequency  Min 1X/week        Progress Toward Goals  OT Goals(current goals can now be found in the care plan section)  Progress towards OT goals: Progressing toward goals  Acute Rehab OT Goals Patient Stated Goal: Have children come visit OT Goal Formulation: With patient Time For Goal Achievement: 08/05/23 Potential to Achieve Goals: Fair ADL Goals Pt Will Perform Grooming: with supervision;with set-up;standing Pt Will Perform Lower Body Dressing: sit to/from stand;sitting/lateral leans;with supervision;with set-up Pt Will Transfer to Toilet: with set-up;with supervision;ambulating;regular height toilet  Plan      Co-evaluation                 AM-PAC OT "6 Clicks" Daily Activity     Outcome Measure   Help from another person eating meals?: A Little Help from another person taking care of personal grooming?: A Little Help from another person toileting, which includes using toliet, bedpan, or urinal?: A Little Help from another person bathing (including washing, rinsing, drying)?: A Little Help from another person to put on and taking off regular upper body clothing?: A Little Help from another person to put on and taking off regular lower body clothing?: A Little 6 Click Score: 18    End of Session Equipment Utilized During Treatment: Rolling walker (2 wheels)  OT Visit Diagnosis: Unsteadiness on feet (R26.81);Other abnormalities of gait and mobility (R26.89);Muscle weakness (generalized) (M62.81);History of falling (Z91.81)   Activity Tolerance Patient tolerated treatment well   Patient Left in bed;with call bell/phone within reach;with bed alarm set   Nurse Communication  Mobility status        Time: 7846-9629 OT Time Calculation (min): 38 min  Charges: OT General Charges $OT Visit: 1 Visit OT Treatments $Self Care/Home Management : 23-37 mins $Therapeutic Activity: 8-22 mins  Linward Foster, MS, OTR/L  Alvester Morin 07/26/2023, 4:30 PM

## 2023-07-26 NOTE — ED Notes (Signed)
Pt refusing dinner at this time as she ate lunch late. States while she was sleeping, "they were waiting for me to die, but God was with me."

## 2023-07-26 NOTE — ED Notes (Signed)
Pt ate 75% of lunch, assisted by OT

## 2023-07-26 NOTE — Progress Notes (Signed)
Physical Therapy Treatment Patient Details Name: [name] MRN: [id number] DOB: [birth year] Today's Date: 07/26/2023   History of Present Illness Pt is a 89 y/o F who presented to the hospital after a fall & suffering facial trauma. Pt's family reports pt's cognition has be declining over the past few months & pt has had multiple falls.  CT shows minimally displaced B nasal bone fxs. PMH: HTN, HLD, Lewy body dementia, DM2, paroxysmal a-fib    PT Comments  Pt alert and oriented to person, place, and year ("I don't keep track of the date"), and denies any pain t/o session. Supervision for supine > sit, CGA for 1st STS from bed and toilet (with minA and grab bars required to lower pt to toilet), and CGA for 50' amb with RW. HR and O2 monitored in sitting and walking t/o session; HR got up to 85 with amb and O2 stayed at 98% t/o session. 2/2 increased cardiovascular and muscular fatigue, minA required for 2nd STS from bed and for sit > supine transfer. Pt required CGA for standing functional reach test to maintain balance, even with support from RW, as well as CGA to perform hand hygiene at sink 2/2 fatigue. Pt unable to complete more than 5 standing marches without sitting and reporting fatigue and dizziness. Vitals assessed in supine (131/51, HR 84, O2 98%). RN notified on mobility, vitals, and symptoms. Pt would benefit from continued skilled therapy upon d/c to address impairments (including muscular and cardiovascular endurance deficits, balance impairments, and dizziness) and to maximize pt safety at home secondary to living alone and having 2 STE.    If plan is discharge home, recommend the following: A little help with walking and/or transfers;A little help with bathing/dressing/bathroom;Assistance with cooking/housework;Direct supervision/assist for financial management;Supervision due to cognitive status;Assist for transportation;Help with stairs or ramp for entrance;Direct  supervision/assist for medications management   Can travel by private vehicle      yes  Equipment Recommendations  None recommended by PT    Recommendations for Other Services       Precautions / Restrictions Precautions Precautions: Fall Restrictions Weight Bearing Restrictions: No     Mobility  Bed Mobility Overal bed mobility: Needs Assistance Bed Mobility: Supine to Sit, Sit to Supine     Supine to sit: HOB elevated, Supervision Sit to supine: HOB elevated, Min assist   General bed mobility comments: minA at leg for sit > supine 2/2 fatigue    Transfers Overall transfer level: Needs assistance Equipment used: Rolling walker (2 wheels) Transfers: Sit to/from Stand Sit to Stand: Contact guard assist, Min assist           General transfer comment: 1st STS from EOB with CGA, minA to lower to toilet with grab bars; 2nd STS from EOB minA 2/2 fatigue    Ambulation/Gait Ambulation/Gait assistance: Contact guard assist Gait Distance (Feet): 50 Feet Assistive device: Rolling walker (2 wheels) Gait Pattern/deviations: Trunk flexed, Narrow base of support, Step-through pattern Gait velocity: decreased     General Gait Details: L foot IR; pushing RW out far; decreased step height/length; limited by fatigue and endurance   Stairs             Wheelchair Mobility     Tilt Bed    Modified Rankin (Stroke Patients Only)       Balance Overall balance assessment: Needs assistance Sitting-balance support: Feet supported, No upper extremity supported Sitting balance-Leahy Scale: Good     Standing balance support: No upper extremity  supported, Bilateral upper extremity supported, During functional activity Standing balance-Leahy Scale: Fair Standing balance comment: CGA for standing to perform hand hygiene; CGA and one hand on RW for standing functional reach outside BOS test; performed 5 standing marches with RW but limited 2/2 fatigue and dizziness                             Cognition Arousal: Alert Behavior During Therapy: WFL for tasks assessed/performed Overall Cognitive Status: History of cognitive impairments - at baseline                                 General Comments: oriented to person, place, and year        Exercises      General Comments        Pertinent Vitals/Pain Pain Assessment Pain Assessment: No/denies pain    Home Living                          Prior Function            PT Goals (current goals can now be found in the care plan section) Acute Rehab PT Goals Patient Stated Goal: feel better PT Goal Formulation: With patient Time For Goal Achievement: 08/07/23 Potential to Achieve Goals: Good Progress towards PT goals: Progressing toward goals    Frequency    Min 1X/week      PT Plan      Co-evaluation              AM-PAC PT "6 Clicks" Mobility   Outcome Measure  Help needed turning from your back to your side while in a flat bed without using bedrails?: None Help needed moving from lying on your back to sitting on the side of a flat bed without using bedrails?: A Little Help needed moving to and from a bed to a chair (including a wheelchair)?: A Little Help needed standing up from a chair using your arms (e.g., wheelchair or bedside chair)?: A Little Help needed to walk in hospital room?: A Little Help needed climbing 3-5 steps with a railing? : A Little 6 Click Score: 19    End of Session Equipment Utilized During Treatment: Gait belt Activity Tolerance: Patient tolerated treatment well;No increased pain;Patient limited by fatigue (limited by dizziness) Patient left: in bed;with call bell/phone within reach;with bed alarm set Nurse Communication: Mobility status (dizziness reports) PT Visit Diagnosis: Unsteadiness on feet (R26.81);Muscle weakness (generalized) (M62.81)     Time: 4098-1191 PT Time Calculation (min) (ACUTE ONLY): 12  min  Charges:    $Gait Training: 8-22 mins PT General Charges $$ ACUTE PT VISIT: 1 Visit                       Shauna Hugh, SPT 07/26/2023, 12:00 PM

## 2023-07-26 NOTE — Inpatient Diabetes Management (Signed)
Inpatient Diabetes Program Recommendations  AACE/ADA: New Consensus Statement on Inpatient Glycemic Control (2015)  Target Ranges:  Prepandial:   less than 140 mg/dL      Peak postprandial:   less than 180 mg/dL (1-2 hours)      Critically ill patients:  140 - 180 mg/dL    Latest Reference Range & Units 07/23/23 05:49  Hemoglobin A1C 4.8 - 5.6 % 8.4 (H)  (H): Data is abnormally high  Latest Reference Range & Units 07/24/23 09:09 07/24/23 11:45 07/24/23 17:35  Glucose-Capillary 70 - 99 mg/dL 161 (H)   12 units Semglee 146 (H)  1 unit Novolog  91  (H): Data is abnormally high  Latest Reference Range & Units 07/25/23 07:52 07/25/23 13:45 07/25/23 16:34 07/25/23 18:43 07/25/23 21:52  Glucose-Capillary 70 - 99 mg/dL 82   Pt Refused Semglee this AM 250 (H)  3 units Novolog  405 (H)  9 units Novolog  321 (H) 204 (H)  (H): Data is abnormally high  Latest Reference Range & Units 07/26/23 09:09  Glucose-Capillary 70 - 99 mg/dL 096 (H)  12 units Semglee  (H): Data is abnormally high    To ED for Psychiatric Evaluation   History: DM, Dementia  Home DM Meds: Humalog 12 units with Breakfast/ 15 units with Lunch/ 15 units with Dinner       Lantus 12 units at bedtime       Dexcom G7 CGM  Current Orders: Semglee 12 units daily     Novolog Sensitive Correction Scale/ SSI (0-9 units) TID AC    MD- Note pt Refused her Semglee Insulin yest AM.  CBGs yest afternoon severely elevated.  Pt agreed to take Semglee this AM  Please consider Starting Novolog Meal Coverage: Novolog 8 units TID with meals (50% home dose) HOLD if pt NPO HOLD if pt eats <50% meals     ENDO: Dr. Tedd Sias with [facility] Last seen 06/30/2023 Instructions given at that appt: Please adjust the Humalog insulin to:  16 units before breakfast 16 units before lunch 16 units before supper.  Keep taking Lantus 12 units before bedtime    --Will follow patient during hospitalization--  Ambrose Finland RN, MSN, CDCES Diabetes Coordinator Inpatient Glycemic Control Team Team Pager: [phone] (8a-5p)

## 2023-07-26 NOTE — ED Notes (Addendum)
Pt talking with son, Fayrene Fearing, on phone. Told son on phone that she "spat out the fruit because there was so much poison in it." This RN gave Fayrene Fearing an update concerning pt.

## 2023-07-26 NOTE — ED Notes (Signed)
Pt assisted to bathroom to have BM.

## 2023-07-26 NOTE — ED Notes (Signed)
Pt eating breakfast 

## 2023-07-27 DIAGNOSIS — S0012XA Contusion of left eyelid and periocular area, initial encounter: Secondary | ICD-10-CM | POA: Diagnosis not present

## 2023-07-27 DIAGNOSIS — R2681 Unsteadiness on feet: Secondary | ICD-10-CM | POA: Diagnosis not present

## 2023-07-27 DIAGNOSIS — S0011XA Contusion of right eyelid and periocular area, initial encounter: Secondary | ICD-10-CM | POA: Diagnosis not present

## 2023-07-27 DIAGNOSIS — M47812 Spondylosis without myelopathy or radiculopathy, cervical region: Secondary | ICD-10-CM | POA: Diagnosis not present

## 2023-07-27 DIAGNOSIS — I10 Essential (primary) hypertension: Secondary | ICD-10-CM | POA: Diagnosis not present

## 2023-07-27 DIAGNOSIS — S0083XA Contusion of other part of head, initial encounter: Secondary | ICD-10-CM | POA: Diagnosis not present

## 2023-07-27 DIAGNOSIS — I480 Paroxysmal atrial fibrillation: Secondary | ICD-10-CM | POA: Diagnosis not present

## 2023-07-27 DIAGNOSIS — E785 Hyperlipidemia, unspecified: Secondary | ICD-10-CM | POA: Diagnosis not present

## 2023-07-27 DIAGNOSIS — S022XXA Fracture of nasal bones, initial encounter for closed fracture: Secondary | ICD-10-CM | POA: Diagnosis not present

## 2023-07-27 DIAGNOSIS — E119 Type 2 diabetes mellitus without complications: Secondary | ICD-10-CM | POA: Diagnosis not present

## 2023-07-27 DIAGNOSIS — M6281 Muscle weakness (generalized): Secondary | ICD-10-CM | POA: Diagnosis not present

## 2023-07-27 DIAGNOSIS — I6782 Cerebral ischemia: Secondary | ICD-10-CM | POA: Diagnosis not present

## 2023-07-27 DIAGNOSIS — M5030 Other cervical disc degeneration, unspecified cervical region: Secondary | ICD-10-CM | POA: Diagnosis not present

## 2023-07-27 DIAGNOSIS — R296 Repeated falls: Secondary | ICD-10-CM | POA: Diagnosis not present

## 2023-07-27 LAB — CBG MONITORING, ED
Glucose-Capillary: 125 mg/dL — ABNORMAL HIGH (ref 70–99)
Glucose-Capillary: 218 mg/dL — ABNORMAL HIGH (ref 70–99)
Glucose-Capillary: 140 mg/dL — ABNORMAL HIGH (ref 70–99)

## 2023-07-27 MED ORDER — DUPILUMAB 300 MG/2ML ~~LOC~~ SOAJ
300.0000 mg | SUBCUTANEOUS | Status: DC
  Administered 2023-07-27: 300 mg via SUBCUTANEOUS
  Filled 2023-07-27 (×2): qty 2

## 2023-07-27 NOTE — ED Notes (Signed)
Assisted to toilet, ambulatory with walker. Breakfast tray set up

## 2023-07-27 NOTE — ED Notes (Signed)
FSBS 140   pt refusing dinner meal at this time.  Pt states she just ate lunch and is not hungry.   1700 dose of insulin not given.

## 2023-07-27 NOTE — ED Provider Notes (Signed)
-----------------------------------------   5:28 PM on 07/27/2023 -----------------------------------------   Blood pressure (!) 116/56, pulse 76, temperature 97.7 F (36.5 C), resp. rate 18, height 5\' 2"  (1.575 m), weight 59 kg, SpO2 95%.  The patient is calm and cooperative at this time.  There have been no acute events since the last update.  Awaiting disposition plan from [facility] team.   Janith Lima, MD 07/27/23 1728

## 2023-07-27 NOTE — ED Notes (Signed)
Assisted to rsetroom

## 2023-07-27 NOTE — ED Notes (Signed)
Grand daughter in with pt now.

## 2023-07-27 NOTE — ED Notes (Signed)
Son and daughter-in-law in with pt for a visit.

## 2023-07-27 NOTE — ED Provider Notes (Signed)
-----------------------------------------   5:59 AM on 07/27/2023 -----------------------------------------   Blood pressure 138/82, pulse 84, temperature 98.4 F (36.9 C), resp. rate 16, height 5\' 2"  (1.575 m), weight 59 kg, SpO2 94%.  The patient is calm and cooperative at this time.  There have been no acute events since the last update.  Awaiting disposition plan from Social Work team.   Irean Hong, MD 07/27/23 [phone]

## 2023-07-27 NOTE — ED Notes (Signed)
Pharmacy messaged for multiple missing meds

## 2023-07-27 NOTE — ED Notes (Signed)
Home medication dupixent sent to pharmacy

## 2023-07-27 NOTE — ED Notes (Signed)
Pt states does not like spaghetti delivered for lunch. Grilled cheese ordered

## 2023-07-27 NOTE — ED Notes (Signed)
Recliner brought to room and pt assisted to recliner. Pt thankful

## 2023-07-28 ENCOUNTER — Telehealth: Payer: Self-pay

## 2023-07-28 DIAGNOSIS — M5030 Other cervical disc degeneration, unspecified cervical region: Secondary | ICD-10-CM | POA: Diagnosis not present

## 2023-07-28 DIAGNOSIS — I10 Essential (primary) hypertension: Secondary | ICD-10-CM | POA: Diagnosis not present

## 2023-07-28 DIAGNOSIS — I480 Paroxysmal atrial fibrillation: Secondary | ICD-10-CM | POA: Diagnosis not present

## 2023-07-28 DIAGNOSIS — R2681 Unsteadiness on feet: Secondary | ICD-10-CM | POA: Diagnosis not present

## 2023-07-28 DIAGNOSIS — S022XXA Fracture of nasal bones, initial encounter for closed fracture: Secondary | ICD-10-CM | POA: Diagnosis not present

## 2023-07-28 DIAGNOSIS — S0011XA Contusion of right eyelid and periocular area, initial encounter: Secondary | ICD-10-CM | POA: Diagnosis not present

## 2023-07-28 DIAGNOSIS — S0083XA Contusion of other part of head, initial encounter: Secondary | ICD-10-CM | POA: Diagnosis not present

## 2023-07-28 DIAGNOSIS — M47812 Spondylosis without myelopathy or radiculopathy, cervical region: Secondary | ICD-10-CM | POA: Diagnosis not present

## 2023-07-28 DIAGNOSIS — E785 Hyperlipidemia, unspecified: Secondary | ICD-10-CM | POA: Diagnosis not present

## 2023-07-28 DIAGNOSIS — S0012XA Contusion of left eyelid and periocular area, initial encounter: Secondary | ICD-10-CM | POA: Diagnosis not present

## 2023-07-28 DIAGNOSIS — M6281 Muscle weakness (generalized): Secondary | ICD-10-CM | POA: Diagnosis not present

## 2023-07-28 DIAGNOSIS — E119 Type 2 diabetes mellitus without complications: Secondary | ICD-10-CM | POA: Diagnosis not present

## 2023-07-28 DIAGNOSIS — R296 Repeated falls: Secondary | ICD-10-CM | POA: Diagnosis not present

## 2023-07-28 DIAGNOSIS — I6782 Cerebral ischemia: Secondary | ICD-10-CM | POA: Diagnosis not present

## 2023-07-28 LAB — CBG MONITORING, ED
Glucose-Capillary: 144 mg/dL — ABNORMAL HIGH (ref 70–99)
Glucose-Capillary: 187 mg/dL — ABNORMAL HIGH (ref 70–99)
Glucose-Capillary: 222 mg/dL — ABNORMAL HIGH (ref 70–99)
Glucose-Capillary: 358 mg/dL — ABNORMAL HIGH (ref 70–99)
Glucose-Capillary: 46 mg/dL — ABNORMAL LOW (ref 70–99)
Glucose-Capillary: 65 mg/dL — ABNORMAL LOW (ref 70–99)

## 2023-07-28 MED ORDER — QUETIAPINE FUMARATE 25 MG PO TABS
50.0000 mg | ORAL_TABLET | Freq: Once | ORAL | Status: AC
  Administered 2023-07-28: 50 mg via ORAL
  Filled 2023-07-28: qty 2

## 2023-07-28 MED ORDER — HALOPERIDOL LACTATE 5 MG/ML IJ SOLN
5.0000 mg | Freq: Once | INTRAMUSCULAR | Status: AC | PRN
  Administered 2023-07-31: 5 mg via INTRAMUSCULAR
  Filled 2023-07-28: qty 1

## 2023-07-28 MED ORDER — HYDROCOD POLI-CHLORPHE POLI ER 10-8 MG/5ML PO SUER
5.0000 mL | Freq: Once | ORAL | Status: AC
  Administered 2023-07-28: 5 mL via ORAL
  Filled 2023-07-28: qty 5

## 2023-07-28 NOTE — TOC Progression Note (Signed)
Transition of Care [facility]) - Progression Note    Patient Details  Name: KIMBERL RAISNER MRN: [id number] Date of Birth: 1929/03/14  [facility] CM/SW Contact  Marquita Palms, LCSW Phone Number: 07/28/2023, 4:09 PM  Clinical Narrative:     CSW spoke with patient son concerning SNF placement. He reported that he would like to look into placement for [facility] facility or [facility] because he knows of people going there. CSW called [facility]; left message with receptionist for admissions to give a call back. Awaiting call back. CSW unable to find an "[place]." Patients son reported his mother lives alone and can not go back home alone. Patient has refused to participate in PT evaluation upon request. Physical therapist left note saying will try tomorrow.   Expected Discharge Plan: Skilled Nursing Facility Barriers to Discharge: Continued Medical Work up  Expected Discharge Plan and Services       Living arrangements for the past 2 months: Single Family Home                                       Social Determinants of Health (SDOH) Interventions SDOH Screenings   Food Insecurity: No Food Insecurity (05/19/2023)   Received from [facility]  Housing: Low Risk  (11/08/2022)  Transportation Needs: No Transportation Needs (05/19/2023)   Received from [facility]  Utilities: Not At Risk (05/19/2023)   Received from [facility]  Alcohol Screen: Low Risk  (05/20/2022)  Depression (PHQ2-9): Low Risk  (06/21/2022)  Financial Resource Strain: Low Risk  (05/19/2023)   Received from [facility]  Physical Activity: Inactive (05/19/2023)   Received from [facility]  Social Connections: Moderately Isolated (05/20/2022)  Stress: No Stress Concern Present (05/20/2022)  Tobacco Use: Low Risk  (07/23/2023)    Readmission Risk Interventions    08/02/2022    1:07 PM   Readmission Risk Prevention Plan  Transportation Screening Complete  Medication Review (RN Care Manager) Complete  PCP or Specialist appointment within 3-5 days of discharge Complete  HRI or Home Care Consult Complete  SW Recovery Care/Counseling Consult Complete  Palliative Care Screening Not Applicable  Skilled Nursing Facility Not Applicable

## 2023-07-28 NOTE — ED Notes (Signed)
Pt provided with lunch tray. Assisted pt by cutting roast into bite size pieces.

## 2023-07-28 NOTE — ED Notes (Signed)
Pt reported to PT/OT after walking that she "felt lightheaded" stat CBG obtained

## 2023-07-28 NOTE — Progress Notes (Signed)
PT Cancellation Note  Patient Details Name: [name] MRN: [id number] DOB: March 07, 1929   Cancelled Treatment:    Reason Eval/Treat Not Completed: Other (comment).  Chart reviewed and attempted to see pt.  Pt currently stating that she was fatigued from performing ambulation prior to therapist arrival.  Posey Rea if pt has actually been up ambulating due to cognitive status.  Will re-attempt at later date/time as medically appropriately.     Nolon Bussing, PT, DPT Physical Therapist - [facility]  07/28/23, 2:46 PM

## 2023-07-28 NOTE — ED Provider Notes (Signed)
-----------------------------------------   5:26 AM on 07/28/2023 -----------------------------------------   Blood pressure (!) 140/57, pulse 76, temperature 97.6 F (36.4 C), temperature source Oral, resp. rate 17, height 5\' 2"  (1.575 m), weight 59 kg, SpO2 97%.  The patient is calm and cooperative at this time.  There have been no acute events since the last update.  Awaiting disposition plan from Social Work team.   Irean Hong, MD 07/28/23 [phone]

## 2023-07-28 NOTE — ED Notes (Signed)
Breakfast provided.

## 2023-07-28 NOTE — Telephone Encounter (Signed)
Dupixent refill request came from [place]. I don't see Dupixent discussed in the last office visit. I do see she was injected in the hospital yesterday. Okay to send in RF?
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
## 2023-07-28 NOTE — ED Notes (Signed)
CBG recheck @ 65,
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
## 2023-07-28 NOTE — ED Notes (Signed)
CBG obtained, pt reads @ 46, 8oz of juice given, will recheck cbg in 30

## 2023-07-28 NOTE — ED Notes (Signed)
Patient got out of bed, and got dressed into her bloody clothes.  She was at the door with her walker saying that she was leaving and her sister was here to take her to [place].  Patient was redirected back into room, but refuses to change into a gown.  Patient is convinced family is here to take her away.  She says her clothes are at home and already packed, but  she has to leave.  Patient has agreed to sit on the edge of the bed and wait for them to come.  She is in the room screaming names of family members to take her home.
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
## 2023-07-28 NOTE — ED Notes (Signed)
TOC placement 

## 2023-07-28 NOTE — ED Notes (Signed)
Pt yelling in hallway, believes that she is hearing her son and requesting to leave. Pt took PO PRN medication, is now resting comfortably in room.

## 2023-07-29 DIAGNOSIS — S022XXA Fracture of nasal bones, initial encounter for closed fracture: Secondary | ICD-10-CM | POA: Diagnosis not present

## 2023-07-29 DIAGNOSIS — I10 Essential (primary) hypertension: Secondary | ICD-10-CM | POA: Diagnosis not present

## 2023-07-29 DIAGNOSIS — S0011XA Contusion of right eyelid and periocular area, initial encounter: Secondary | ICD-10-CM | POA: Diagnosis not present

## 2023-07-29 DIAGNOSIS — R2681 Unsteadiness on feet: Secondary | ICD-10-CM | POA: Diagnosis not present

## 2023-07-29 DIAGNOSIS — I6782 Cerebral ischemia: Secondary | ICD-10-CM | POA: Diagnosis not present

## 2023-07-29 DIAGNOSIS — M6281 Muscle weakness (generalized): Secondary | ICD-10-CM | POA: Diagnosis not present

## 2023-07-29 DIAGNOSIS — M47812 Spondylosis without myelopathy or radiculopathy, cervical region: Secondary | ICD-10-CM | POA: Diagnosis not present

## 2023-07-29 DIAGNOSIS — R296 Repeated falls: Secondary | ICD-10-CM | POA: Diagnosis not present

## 2023-07-29 DIAGNOSIS — S0012XA Contusion of left eyelid and periocular area, initial encounter: Secondary | ICD-10-CM | POA: Diagnosis not present

## 2023-07-29 DIAGNOSIS — S0083XA Contusion of other part of head, initial encounter: Secondary | ICD-10-CM | POA: Diagnosis not present

## 2023-07-29 DIAGNOSIS — E785 Hyperlipidemia, unspecified: Secondary | ICD-10-CM | POA: Diagnosis not present

## 2023-07-29 DIAGNOSIS — I480 Paroxysmal atrial fibrillation: Secondary | ICD-10-CM | POA: Diagnosis not present

## 2023-07-29 DIAGNOSIS — E119 Type 2 diabetes mellitus without complications: Secondary | ICD-10-CM | POA: Diagnosis not present

## 2023-07-29 DIAGNOSIS — M5030 Other cervical disc degeneration, unspecified cervical region: Secondary | ICD-10-CM | POA: Diagnosis not present

## 2023-07-29 LAB — CBG MONITORING, ED
Glucose-Capillary: 123 mg/dL — ABNORMAL HIGH (ref 70–99)
Glucose-Capillary: 198 mg/dL — ABNORMAL HIGH (ref 70–99)
Glucose-Capillary: 139 mg/dL — ABNORMAL HIGH (ref 70–99)
Glucose-Capillary: 96 mg/dL (ref 70–99)

## 2023-07-29 NOTE — ED Notes (Signed)
Room cleaned of trash, pt ate 100% of lunch tray. Pt offered toileting, given cloth to clean hands, and assisted back in to bed. Call bell in reach- pt reminded she can use call bell for assistance.

## 2023-07-29 NOTE — ED Notes (Addendum)
Pt assisted to the toilet with a walker. Pt reports some weakness in legs compared to typical strength at home- reports she always uses a walker at home. Pt changed out of clothes and placed in clean hospital gown. Pt given dinner tray- states she will ask staff to reheat meal when she is hungry. Old bruising noted on left outer thigh as well as nose and cheeks bilaterally, pt reports the injuries are from her fall.

## 2023-07-29 NOTE — ED Notes (Signed)
Dinner plate reheated in break room for pt per her request.

## 2023-07-29 NOTE — ED Notes (Signed)
Pt sleeping, snacks at bedside left by family visitor.

## 2023-07-29 NOTE — ED Notes (Signed)
Pt assisted to toilet with walker. Pt resting calmly in bed at this time.

## 2023-07-29 NOTE — ED Notes (Signed)
Pt sleeping in bed, NAD noted. Bedside table cleared of 90% eaten breakfast. Pt given pillow and repositioned in bed for comfort.

## 2023-07-29 NOTE — ED Notes (Signed)
TOC

## 2023-07-29 NOTE — ED Notes (Signed)
Assisted with breakfast tray set up

## 2023-07-29 NOTE — ED Notes (Signed)
Pt assisted with lunch tray set-up.

## 2023-07-29 NOTE — ED Notes (Signed)
toc

## 2023-07-29 NOTE — ED Notes (Signed)
Room cleaned of trash from dinner tray. Pt ate all of her food. Pt denies needs at this time, states she wants to stay sitting on bed for now.

## 2023-07-29 NOTE — ED Notes (Signed)
Pt given snack per request.

## 2023-07-29 NOTE — ED Notes (Signed)
Pt assisted to toilet at this time and given snack per request.

## 2023-07-30 DIAGNOSIS — S0012XA Contusion of left eyelid and periocular area, initial encounter: Secondary | ICD-10-CM | POA: Diagnosis not present

## 2023-07-30 DIAGNOSIS — S0083XA Contusion of other part of head, initial encounter: Secondary | ICD-10-CM | POA: Diagnosis not present

## 2023-07-30 DIAGNOSIS — I6782 Cerebral ischemia: Secondary | ICD-10-CM | POA: Diagnosis not present

## 2023-07-30 DIAGNOSIS — M6281 Muscle weakness (generalized): Secondary | ICD-10-CM | POA: Diagnosis not present

## 2023-07-30 DIAGNOSIS — S0011XA Contusion of right eyelid and periocular area, initial encounter: Secondary | ICD-10-CM | POA: Diagnosis not present

## 2023-07-30 DIAGNOSIS — M47812 Spondylosis without myelopathy or radiculopathy, cervical region: Secondary | ICD-10-CM | POA: Diagnosis not present

## 2023-07-30 DIAGNOSIS — R296 Repeated falls: Secondary | ICD-10-CM | POA: Diagnosis not present

## 2023-07-30 DIAGNOSIS — R2681 Unsteadiness on feet: Secondary | ICD-10-CM | POA: Diagnosis not present

## 2023-07-30 DIAGNOSIS — E119 Type 2 diabetes mellitus without complications: Secondary | ICD-10-CM | POA: Diagnosis not present

## 2023-07-30 DIAGNOSIS — E785 Hyperlipidemia, unspecified: Secondary | ICD-10-CM | POA: Diagnosis not present

## 2023-07-30 DIAGNOSIS — I480 Paroxysmal atrial fibrillation: Secondary | ICD-10-CM | POA: Diagnosis not present

## 2023-07-30 DIAGNOSIS — M5030 Other cervical disc degeneration, unspecified cervical region: Secondary | ICD-10-CM | POA: Diagnosis not present

## 2023-07-30 DIAGNOSIS — I10 Essential (primary) hypertension: Secondary | ICD-10-CM | POA: Diagnosis not present

## 2023-07-30 DIAGNOSIS — S022XXA Fracture of nasal bones, initial encounter for closed fracture: Secondary | ICD-10-CM | POA: Diagnosis not present

## 2023-07-30 LAB — CBG MONITORING, ED
Glucose-Capillary: 112 mg/dL — ABNORMAL HIGH (ref 70–99)
Glucose-Capillary: 146 mg/dL — ABNORMAL HIGH (ref 70–99)
Glucose-Capillary: 244 mg/dL — ABNORMAL HIGH (ref 70–99)
Glucose-Capillary: 94 mg/dL (ref 70–99)

## 2023-07-30 NOTE — ED Notes (Signed)
Pt assisted to restroom. Breakfast tray set up
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
## 2023-07-30 NOTE — ED Provider Notes (Signed)
-----------------------------------------   6:01 AM on 07/30/2023 -----------------------------------------   Blood pressure (!) 102/45, pulse 69, temperature 97.7 F (36.5 C), temperature source Oral, resp. rate 17, height 5\' 2"  (1.575 m), weight 59 kg, SpO2 95%.  The patient is calm and cooperative at this time.  There have been no acute events since the last update.  Awaiting disposition plan from Social Work team.   Irean Hong, MD 07/30/23 [phone]

## 2023-07-30 NOTE — ED Notes (Signed)
VOL/Pending TOC Placement

## 2023-07-30 NOTE — ED Notes (Signed)
Recliner brought to room, pt assisted to recliner. Pt thankful

## 2023-07-31 DIAGNOSIS — E119 Type 2 diabetes mellitus without complications: Secondary | ICD-10-CM | POA: Diagnosis not present

## 2023-07-31 DIAGNOSIS — S0012XA Contusion of left eyelid and periocular area, initial encounter: Secondary | ICD-10-CM | POA: Diagnosis not present

## 2023-07-31 DIAGNOSIS — I480 Paroxysmal atrial fibrillation: Secondary | ICD-10-CM | POA: Diagnosis not present

## 2023-07-31 DIAGNOSIS — S0083XA Contusion of other part of head, initial encounter: Secondary | ICD-10-CM | POA: Diagnosis not present

## 2023-07-31 DIAGNOSIS — M6281 Muscle weakness (generalized): Secondary | ICD-10-CM | POA: Diagnosis not present

## 2023-07-31 DIAGNOSIS — M5030 Other cervical disc degeneration, unspecified cervical region: Secondary | ICD-10-CM | POA: Diagnosis not present

## 2023-07-31 DIAGNOSIS — I10 Essential (primary) hypertension: Secondary | ICD-10-CM | POA: Diagnosis not present

## 2023-07-31 DIAGNOSIS — E785 Hyperlipidemia, unspecified: Secondary | ICD-10-CM | POA: Diagnosis not present

## 2023-07-31 DIAGNOSIS — M47812 Spondylosis without myelopathy or radiculopathy, cervical region: Secondary | ICD-10-CM | POA: Diagnosis not present

## 2023-07-31 DIAGNOSIS — R2681 Unsteadiness on feet: Secondary | ICD-10-CM | POA: Diagnosis not present

## 2023-07-31 DIAGNOSIS — R296 Repeated falls: Secondary | ICD-10-CM | POA: Diagnosis not present

## 2023-07-31 DIAGNOSIS — S0011XA Contusion of right eyelid and periocular area, initial encounter: Secondary | ICD-10-CM | POA: Diagnosis not present

## 2023-07-31 DIAGNOSIS — S022XXA Fracture of nasal bones, initial encounter for closed fracture: Secondary | ICD-10-CM | POA: Diagnosis not present

## 2023-07-31 DIAGNOSIS — I6782 Cerebral ischemia: Secondary | ICD-10-CM | POA: Diagnosis not present

## 2023-07-31 LAB — CBG MONITORING, ED
Glucose-Capillary: 87 mg/dL (ref 70–99)
Glucose-Capillary: 185 mg/dL — ABNORMAL HIGH (ref 70–99)
Glucose-Capillary: 96 mg/dL (ref 70–99)

## 2023-07-31 NOTE — ED Notes (Signed)
Breakfast not delivered, called dietary told should arrive by 0930.

## 2023-07-31 NOTE — ED Notes (Signed)
VOL Pending TOC placement

## 2023-07-31 NOTE — ED Provider Notes (Signed)
-----------------------------------------   6:41 AM on 07/31/2023 -----------------------------------------   Blood pressure 136/70, pulse 82, temperature 98 F (36.7 C), resp. rate 18, height 5\' 2"  (1.575 m), weight 59 kg, SpO2 95%.  The patient is calm and cooperative at this time.  There have been no acute events since the last update.  Awaiting disposition plan from case management/social work.    Skyeler Scalese, Layla Maw, DO 07/31/23 [phone]

## 2023-07-31 NOTE — ED Notes (Signed)
Pt given breakfast tray

## 2023-07-31 NOTE — ED Notes (Signed)
Pt is alert sitting up in bed in no distress. Resp even and unlabored with rise and fall of chest.

## 2023-07-31 NOTE — ED Notes (Signed)
Pt calling out for help finding her shoes, pt under the impression she is leaving today stating Donnie and his spouse are on the way to pick pt up, explained to pt Dr is not ready to DC yet but would keep an eye out for [facility]. Pt also stating she has a bag of clothes somewhere. Pt told her clothes were put in a locker so we don't lose them and she would get those back after Dr says she can leave.

## 2023-07-31 NOTE — ED Notes (Signed)
Pt given lunch tray. This RN helped to set up tray

## 2023-07-31 NOTE — ED Notes (Signed)
Pt noted to have bloody tissue paper in room and drops of blood near sink. Asked pt what happened and pt replied her nose started to bleed. Checked pt, no active bleeding at this time. Blood cleaned from sink area. Pt also had a BM, pt cleaned and brief changed. Pt noted to be red with a little blood on legs. Pad placed in underwear and cream placed on legs.

## 2023-07-31 NOTE — ED Notes (Signed)
Checking on pt, pt sitting in chair next to sink with lights off asking if the door was locked stating "I heard footsteps and him say there's one door locked and she's in there." Pt informed she is at the hospital and nobody is here looking for her.

## 2023-07-31 NOTE — ED Notes (Addendum)
Pt standing in doorway with walker, asked pt if she needed anything and pt replied "I'm just looking for my shoes." Pt informed her shoes are locked in the locker room and we would get them when she is going to be DC but she is not up for DC yet. Pt walked back to chair with walker. Pt also asking if "Fayrene Fearing" is here to pick her up, this RN told pt he is not here.

## 2023-07-31 NOTE — ED Notes (Signed)
Pt up and using restroom stating she wants her old room back and this one is too cold. Heat turned up and warm blankets given. Reoriented that this is her room.

## 2023-07-31 NOTE — ED Notes (Signed)
This RN called son, Fayrene Fearing, to talk to pt as pt was found wandering out of room aorund registration desk. Pt given fat free strawberry icecream.

## 2023-07-31 NOTE — ED Notes (Signed)
Pt given dinner tray, tray set up for pt to eat by this RN
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
## 2023-08-01 ENCOUNTER — Other Ambulatory Visit: Payer: Self-pay

## 2023-08-01 DIAGNOSIS — L209 Atopic dermatitis, unspecified: Secondary | ICD-10-CM

## 2023-08-01 DIAGNOSIS — M6281 Muscle weakness (generalized): Secondary | ICD-10-CM | POA: Diagnosis not present

## 2023-08-01 DIAGNOSIS — E119 Type 2 diabetes mellitus without complications: Secondary | ICD-10-CM | POA: Diagnosis not present

## 2023-08-01 DIAGNOSIS — M47812 Spondylosis without myelopathy or radiculopathy, cervical region: Secondary | ICD-10-CM | POA: Diagnosis not present

## 2023-08-01 DIAGNOSIS — S022XXA Fracture of nasal bones, initial encounter for closed fracture: Secondary | ICD-10-CM | POA: Diagnosis not present

## 2023-08-01 DIAGNOSIS — I6782 Cerebral ischemia: Secondary | ICD-10-CM | POA: Diagnosis not present

## 2023-08-01 DIAGNOSIS — S0011XA Contusion of right eyelid and periocular area, initial encounter: Secondary | ICD-10-CM | POA: Diagnosis not present

## 2023-08-01 DIAGNOSIS — I10 Essential (primary) hypertension: Secondary | ICD-10-CM | POA: Diagnosis not present

## 2023-08-01 DIAGNOSIS — I480 Paroxysmal atrial fibrillation: Secondary | ICD-10-CM | POA: Diagnosis not present

## 2023-08-01 DIAGNOSIS — R296 Repeated falls: Secondary | ICD-10-CM | POA: Diagnosis not present

## 2023-08-01 DIAGNOSIS — M5030 Other cervical disc degeneration, unspecified cervical region: Secondary | ICD-10-CM | POA: Diagnosis not present

## 2023-08-01 DIAGNOSIS — E785 Hyperlipidemia, unspecified: Secondary | ICD-10-CM | POA: Diagnosis not present

## 2023-08-01 DIAGNOSIS — S0083XA Contusion of other part of head, initial encounter: Secondary | ICD-10-CM | POA: Diagnosis not present

## 2023-08-01 DIAGNOSIS — R2681 Unsteadiness on feet: Secondary | ICD-10-CM | POA: Diagnosis not present

## 2023-08-01 DIAGNOSIS — S0012XA Contusion of left eyelid and periocular area, initial encounter: Secondary | ICD-10-CM | POA: Diagnosis not present

## 2023-08-01 LAB — CBG MONITORING, ED
Glucose-Capillary: 94 mg/dL (ref 70–99)
Glucose-Capillary: 202 mg/dL — ABNORMAL HIGH (ref 70–99)
Glucose-Capillary: 53 mg/dL — ABNORMAL LOW (ref 70–99)
Glucose-Capillary: 91 mg/dL (ref 70–99)

## 2023-08-01 MED ORDER — DUPIXENT 300 MG/2ML ~~LOC~~ SOAJ: 300.0000 mg | SUBCUTANEOUS | 1 refills | Status: AC

## 2023-08-01 NOTE — ED Notes (Signed)
PT VOL/PENDING TOC PLACEMENT.

## 2023-08-01 NOTE — ED Notes (Signed)
Pt given dinner tray and beverage  

## 2023-08-01 NOTE — TOC Progression Note (Addendum)
Transition of Care [facility]) - Progression Note    Patient Details  Name: [name] MRN: [id number] Date of Birth: 1928/10/16  Transition of [facility]) CM/SW Contact  Darolyn Rua, [place] Phone Number: 08/01/2023, 9:27 AM  Clinical Narrative:     CSW has expanded bed search in hub to larger geographical area, pending bed offers at this time. Son Fayrene Fearing has been updated. All question and concerns answered at this time.    Expected Discharge Plan: Skilled Nursing Facility Barriers to Discharge: Continued Medical Work up  Expected Discharge Plan and Services       Living arrangements for the past 2 months: Single Family Home                                       Social Determinants of Health (SDOH) Interventions SDOH Screenings   Food Insecurity: No Food Insecurity (05/19/2023)   Received from [facility]  Housing: Low Risk  (11/08/2022)  Transportation Needs: No Transportation Needs (05/19/2023)   Received from [facility]  Utilities: Not At Risk (05/19/2023)   Received from [facility]  Alcohol Screen: Low Risk  (05/20/2022)  Depression (PHQ2-9): Low Risk  (06/21/2022)  Financial Resource Strain: Low Risk  (05/19/2023)   Received from [facility]  Physical Activity: Inactive (05/19/2023)   [facility]  Social Connections: Moderately Isolated (05/20/2022)  Stress: No Stress Concern Present (05/20/2022)  Tobacco Use: Low Risk  (07/23/2023)    Readmission Risk Interventions    08/02/2022    1:07 PM  Readmission Risk Prevention Plan  Transportation Screening Complete  Medication Review (RN Care Manager) Complete  PCP or Specialist appointment within 3-5 days of discharge Complete  HRI or Home Care Consult Complete  SW Recovery Care/Counseling Consult Complete  Palliative Care Screening Not Applicable  Skilled Nursing Facility Not Applicable

## 2023-08-01 NOTE — Telephone Encounter (Signed)
Two refills sent in and noted pt needs appt.

## 2023-08-01 NOTE — ED Notes (Signed)
Ambulatory to toilet. Assisted with breakfast tray set up

## 2023-08-01 NOTE — ED Provider Notes (Signed)
-----------------------------------------   4:35 AM on 08/01/2023 -----------------------------------------   Blood pressure (!) 184/69, pulse 74, temperature 97.6 F (36.4 C), temperature source Oral, resp. rate 18, height 5\' 2"  (1.575 m), weight 59 kg, SpO2 97%.  The patient is calm and cooperative at this time.  There have been no acute events since the last update.  Awaiting disposition plan from case management/social work.    Wlliam Grosso, Layla Maw, DO 08/01/23 [phone]

## 2023-08-01 NOTE — ED Notes (Signed)
This Clinical research associate was approached by physical therapy tech, and stated that the patient felt like their blood sugar has dropped. This writer went in and check patient blood sugar and read 53. Notified RN.

## 2023-08-01 NOTE — ED Notes (Signed)
VOL/Pending TOC Placement

## 2023-08-01 NOTE — ED Provider Notes (Signed)
-----------------------------------------   3:17 PM on 08/01/2023 -----------------------------------------   Blood pressure (!) 160/70, pulse 78, temperature 98.2 F (36.8 C), resp. rate 18, height 5\' 2"  (1.575 m), weight 59 kg, SpO2 98%.  The patient is calm and cooperative at this time.  There have been no acute events since the last update.  Awaiting disposition plan from [facility] team.   Janith Lima, MD 08/01/23 1517

## 2023-08-01 NOTE — Progress Notes (Signed)
OT Cancellation Note  Patient Details Name: [name] MRN: [id number] DOB: [birth year]   Cancelled Treatment:    Reason Eval/Treat Not Completed: Other (comment) (Pt found with report of low blood sugar, OT notified nurse who checked and noted to be 53. Nurse provided graham crackers and apple juice. OT to follow up at later date/time while following POC to address needs once blood sugar is stable.  Constance Goltz 08/01/2023, 4:03 PM

## 2023-08-01 NOTE — ED Notes (Signed)
Notified by NT that pt's CBG was 53. Pt assessed by myself. Alert and oriented, able to eat and drink. Given two apple juice and two graham cracker packets. Notified Dr. Anner Crete. Will notify primary RN of need for reassessment.

## 2023-08-02 DIAGNOSIS — S0012XA Contusion of left eyelid and periocular area, initial encounter: Secondary | ICD-10-CM | POA: Diagnosis not present

## 2023-08-02 DIAGNOSIS — M5030 Other cervical disc degeneration, unspecified cervical region: Secondary | ICD-10-CM | POA: Diagnosis not present

## 2023-08-02 DIAGNOSIS — M47812 Spondylosis without myelopathy or radiculopathy, cervical region: Secondary | ICD-10-CM | POA: Diagnosis not present

## 2023-08-02 DIAGNOSIS — E785 Hyperlipidemia, unspecified: Secondary | ICD-10-CM | POA: Diagnosis not present

## 2023-08-02 DIAGNOSIS — S0011XA Contusion of right eyelid and periocular area, initial encounter: Secondary | ICD-10-CM | POA: Diagnosis not present

## 2023-08-02 DIAGNOSIS — R2681 Unsteadiness on feet: Secondary | ICD-10-CM | POA: Diagnosis not present

## 2023-08-02 DIAGNOSIS — I10 Essential (primary) hypertension: Secondary | ICD-10-CM | POA: Diagnosis not present

## 2023-08-02 DIAGNOSIS — I6782 Cerebral ischemia: Secondary | ICD-10-CM | POA: Diagnosis not present

## 2023-08-02 DIAGNOSIS — S0083XA Contusion of other part of head, initial encounter: Secondary | ICD-10-CM | POA: Diagnosis not present

## 2023-08-02 DIAGNOSIS — I480 Paroxysmal atrial fibrillation: Secondary | ICD-10-CM | POA: Diagnosis not present

## 2023-08-02 DIAGNOSIS — R296 Repeated falls: Secondary | ICD-10-CM | POA: Diagnosis not present

## 2023-08-02 DIAGNOSIS — E119 Type 2 diabetes mellitus without complications: Secondary | ICD-10-CM | POA: Diagnosis not present

## 2023-08-02 DIAGNOSIS — M6281 Muscle weakness (generalized): Secondary | ICD-10-CM | POA: Diagnosis not present

## 2023-08-02 DIAGNOSIS — S022XXA Fracture of nasal bones, initial encounter for closed fracture: Secondary | ICD-10-CM | POA: Diagnosis not present

## 2023-08-02 LAB — CBG MONITORING, ED
Glucose-Capillary: 127 mg/dL — ABNORMAL HIGH (ref 70–99)
Glucose-Capillary: 259 mg/dL — ABNORMAL HIGH (ref 70–99)
Glucose-Capillary: 98 mg/dL (ref 70–99)

## 2023-08-02 MED ORDER — HALOPERIDOL 5 MG PO TABS
5.0000 mg | ORAL_TABLET | Freq: Once | ORAL | Status: AC
  Administered 2023-08-02: 5 mg via ORAL
  Filled 2023-08-02: qty 1

## 2023-08-02 NOTE — TOC Progression Note (Addendum)
Transition of Care [facility]) - Progression Note    Patient Details  Name: [name] MRN: [id number] Date of Birth: 02/15/1929  Transition of Care [facility]) CM/SW Contact  Darolyn Rua, [place] Phone Number: 08/02/2023, 9:28 AM  Clinical Narrative:     Patient has bed offers, email sent to financial counselor to determine if medicaid app was started (referral to financial counselor given 10/27) or if patient qualifies for medicaid.   Noted patient will need transition to long term at facility of choice.   Per financial counselor patient does not meet medicaid criteria, CSW spoke with son [name] who confirms. Currently has bed offer from [facility] who has memory care which is preferred setting due to patient dementia dx, he is interested in speaking with [name] with their business office regarding costs. He is also interested in costs for [facility], CSW has called Misty Stanley with [facility] who will follow up with son on costs and bed availability.        Expected Discharge Plan: Skilled Nursing Facility Barriers to Discharge: Continued Medical Work up  Expected Discharge Plan and Services       Living arrangements for the past 2 months: Single Family Home                                       Social Determinants of Health (SDOH) Interventions SDOH Screenings   Food Insecurity: No Food Insecurity (05/19/2023)   Received from [facility]  Housing: Low Risk  (11/08/2022)  Transportation Needs: No Transportation Needs (05/19/2023)   Received from [facility]  Utilities: Not At Risk (05/19/2023)   Received from [facility]  Alcohol Screen: Low Risk  (05/20/2022)  Depression (PHQ2-9): Low Risk  (06/21/2022)  Financial Resource Strain: Low Risk  (05/19/2023)   Received from [facility]  Physical Activity: Inactive (05/19/2023)   Received from [facility]  Social Connections:  Moderately Isolated (05/20/2022)  Stress: No Stress Concern Present (05/20/2022)  Tobacco Use: Low Risk  (07/23/2023)    Readmission Risk Interventions    08/02/2022    1:07 PM  Readmission Risk Prevention Plan  Transportation Screening Complete  Medication Review (RN Care Manager) Complete  PCP or Specialist appointment within 3-5 days of discharge Complete  HRI or Home Care Consult Complete  SW Recovery Care/Counseling Consult Complete  Palliative Care Screening Not Applicable  Skilled Nursing Facility Not Applicable

## 2023-08-02 NOTE — ED Notes (Signed)
Fsbs 98

## 2023-08-02 NOTE — ED Notes (Signed)
Pt heard knocking on door, proceeded to open door while ambulating with walker. Pt c/o someone knocking out her window and being scared he is going to get her. Pt moved closer to doorway in recliner and informed she is safe. Dried blood noted to back of pants, attempted to assess and take pt to restroom, pt refused and states she needs to go to sleep. Will re-attempt and inform primary RN

## 2023-08-02 NOTE — ED Provider Notes (Signed)
-----------------------------------------   5:26 AM on 08/02/2023 -----------------------------------------   Blood pressure (!) 160/70, pulse 78, temperature 98.2 F (36.8 C), resp. rate 18, height 5\' 2"  (1.575 m), weight 59 kg, SpO2 98%.  The patient is calm and cooperative at this time.  There have been no acute events since the last update.  Awaiting disposition plan from case management/social work.    Coleston Dirosa, Layla Maw, DO 08/02/23 [phone]

## 2023-08-02 NOTE — Progress Notes (Signed)
PT Cancellation Note  Patient Details Name: [name] MRN: [id number] DOB: 11/23/1928   Cancelled Treatment:    Reason Eval/Treat Not Completed: Other (comment).  Chart reviewed and attempted to see pt.  Upon arrival to room OT currently working with pt and assisting with bathing.  Will re-attempt at later date/time as medically appropriate.   Nolon Bussing, PT, DPT Physical Therapist - [facility]  08/02/23, 4:16 PM

## 2023-08-02 NOTE — ED Notes (Signed)
Pt given graham crackers.

## 2023-08-02 NOTE — ED Notes (Signed)
Dinner meal provided

## 2023-08-02 NOTE — ED Notes (Addendum)
Pt requesting another change of clothes. Son [name] contacted for another pair of clothes. Pt speaking to son at this time, pt expressing multiple frustrations to son.  Pt with escalating agitation today, Dr Jannifer Franklin for prn meds

## 2023-08-02 NOTE — ED Notes (Signed)
Pt denies wanting snack at this time

## 2023-08-02 NOTE — ED Notes (Signed)
Hospital meal provided, pt tolerated w/o complaints.  Waste discarded appropriately.  

## 2023-08-02 NOTE — Progress Notes (Signed)
Occupational Therapy Treatment Patient Details Name: [name] MRN: [id number] DOB: Apr 19, 1929 Today's Date: 08/02/2023   History of present illness Pt is a 89 y/o F who presented to the hospital after a fall & suffering facial trauma. Pt's family reports pt's cognition has be declining over the past few months & pt has had multiple falls.  CT shows minimally displaced B nasal bone fxs. PMH: HTN, HLD, Lewy body dementia, DM2, paroxysmal a-fib   OT comments  Pt is seated in recliner upon entry to room. Pleasant and agreeable to ADL session. STS and mobility in room using RW with SBA for safety d/t vision deficits. Able to stand at sink to perform LB bathing of peri-area and buttocks with occasional unilateral support on sink and SBA. Seated UB bathing and bathing with SUP. LB dressing seated in recliner required Min A to get over toenails and to pull over hips in standing. Pt did not c/o pain. Applied deodorant in sitting with set up. Pt is progressing towards OT goals and will cont to require acute OT services to maximize her overall safety and IND. Continues to need SUP for cognitive deficits.       If plan is discharge home, recommend the following:  A little help with walking and/or transfers;A little help with bathing/dressing/bathroom;Assistance with cooking/housework;Assist for transportation;Help with stairs or ramp for entrance;Supervision due to cognitive status   Equipment Recommendations  Other (comment) (defer)    Recommendations for Other Services      Precautions / Restrictions Restrictions Weight Bearing Restrictions: No       Mobility Bed Mobility               General bed mobility comments: NT due to Pt in recliner pre/post session    Transfers Overall transfer level: Needs assistance Equipment used: Rolling walker (2 wheels) Transfers: Sit to/from Stand             General transfer comment: SUP from recliner and chair with arms in ED room      Balance Overall balance assessment: Needs assistance Sitting-balance support: Feet supported, No upper extremity supported Sitting balance-Leahy Scale: Good Sitting balance - Comments: good balance for bathing and dressing seated   Standing balance support: Single extremity supported Standing balance-Leahy Scale: Good Standing balance comment: no LOB in standing to bathe peri-area/buttocks in standing with occ unilateral support on sink                           ADL either performed or assessed with clinical judgement   ADL Overall ADL's : Needs assistance/impaired     Grooming: Wash/dry hands;Standing;Wash/dry face;Supervision/safety Grooming Details (indicate cue type and reason): SUP for safety while standing at sink Upper Body Bathing: Sitting;Supervision/ safety Upper Body Bathing Details (indicate cue type and reason): SUP for safety seated on chair by the sink Lower Body Bathing: Supervison/ safety;Sit to/from stand Lower Body Bathing Details (indicate cue type and reason): SBA in standing at sink with no UE support Upper Body Dressing : Supervision/safety;Sitting   Lower Body Dressing: Minimal assistance;Sitting/lateral leans;Sit to/from stand Lower Body Dressing Details (indicate cue type and reason): Min A to pull mesh underwear over hips and to get socks over toenails Toilet Transfer: Radiographer, therapeutic Details (indicate cue type and reason): chair with arms in ED by room by sink         Functional mobility during ADLs: Supervision/safety;Rolling walker (2 wheels)  Extremity/Trunk Assessment Upper Extremity Assessment Upper Extremity Assessment: Overall WFL for tasks assessed   Lower Extremity Assessment Lower Extremity Assessment: Overall WFL for tasks assessed        Vision       Perception     Praxis      Cognition Arousal: Alert Behavior During Therapy: WFL for tasks assessed/performed Overall Cognitive Status:  Within Functional Limits for tasks assessed                                 General Comments: pleasant and willing to work with OT, still believes she is at home sometimes        Exercises      Shoulder Instructions       General Comments bruising on face and blood on underwear-notified RN    Pertinent Vitals/ Pain       Pain Assessment Pain Assessment: No/denies pain  Home Living                                          Prior Functioning/Environment              Frequency  Min 1X/week        Progress Toward Goals  OT Goals(current goals can now be found in the care plan section)  Progress towards OT goals: Progressing toward goals  Acute Rehab OT Goals Patient Stated Goal: return home OT Goal Formulation: With patient Time For Goal Achievement: 08/05/23 Potential to Achieve Goals: Good  Plan      Co-evaluation                 AM-PAC OT "6 Clicks" Daily Activity     Outcome Measure   Help from another person eating meals?: None Help from another person taking care of personal grooming?: None Help from another person toileting, which includes using toliet, bedpan, or urinal?: A Little Help from another person bathing (including washing, rinsing, drying)?: A Little Help from another person to put on and taking off regular upper body clothing?: None Help from another person to put on and taking off regular lower body clothing?: A Little 6 Click Score: 21    End of Session Equipment Utilized During Treatment: Rolling walker (2 wheels)  OT Visit Diagnosis: Unsteadiness on feet (R26.81);Other abnormalities of gait and mobility (R26.89);Muscle weakness (generalized) (M62.81);History of falling (Z91.81)   Activity Tolerance Patient tolerated treatment well   Patient Left with call bell/phone within reach;in chair   Nurse Communication Mobility status        Time: 5956-3875 OT Time Calculation (min): 25  min  Charges: OT General Charges $OT Visit: 1 Visit OT Treatments $Self Care/Home Management : 23-37 mins  Amadea Keagy, OTR/L  08/02/23, 4:34 PM   Manya Balash E Alann Avey 08/02/2023, 4:29 PM

## 2023-08-02 NOTE — ED Provider Notes (Signed)
-----------------------------------------   11:25 PM on 08/02/2023 -----------------------------------------   Blood pressure (!) 158/70, pulse 78, temperature 98.1 F (36.7 C), resp. rate 18, height 5\' 2"  (1.575 m), weight 59 kg, SpO2 97%.  The patient is calm and cooperative at this time.  There have been no acute events since the last update.  Awaiting disposition plan from [facility] team.   Janith Lima, MD 08/02/23 2325

## 2023-08-03 DIAGNOSIS — E119 Type 2 diabetes mellitus without complications: Secondary | ICD-10-CM | POA: Diagnosis not present

## 2023-08-03 DIAGNOSIS — I10 Essential (primary) hypertension: Secondary | ICD-10-CM | POA: Diagnosis not present

## 2023-08-03 DIAGNOSIS — E785 Hyperlipidemia, unspecified: Secondary | ICD-10-CM | POA: Diagnosis not present

## 2023-08-03 DIAGNOSIS — R2681 Unsteadiness on feet: Secondary | ICD-10-CM | POA: Diagnosis not present

## 2023-08-03 DIAGNOSIS — M47812 Spondylosis without myelopathy or radiculopathy, cervical region: Secondary | ICD-10-CM | POA: Diagnosis not present

## 2023-08-03 DIAGNOSIS — S0011XA Contusion of right eyelid and periocular area, initial encounter: Secondary | ICD-10-CM | POA: Diagnosis not present

## 2023-08-03 DIAGNOSIS — S0083XA Contusion of other part of head, initial encounter: Secondary | ICD-10-CM | POA: Diagnosis not present

## 2023-08-03 DIAGNOSIS — S022XXA Fracture of nasal bones, initial encounter for closed fracture: Secondary | ICD-10-CM | POA: Diagnosis not present

## 2023-08-03 DIAGNOSIS — S0012XA Contusion of left eyelid and periocular area, initial encounter: Secondary | ICD-10-CM | POA: Diagnosis not present

## 2023-08-03 DIAGNOSIS — R296 Repeated falls: Secondary | ICD-10-CM | POA: Diagnosis not present

## 2023-08-03 DIAGNOSIS — M5030 Other cervical disc degeneration, unspecified cervical region: Secondary | ICD-10-CM | POA: Diagnosis not present

## 2023-08-03 DIAGNOSIS — M6281 Muscle weakness (generalized): Secondary | ICD-10-CM | POA: Diagnosis not present

## 2023-08-03 DIAGNOSIS — I480 Paroxysmal atrial fibrillation: Secondary | ICD-10-CM | POA: Diagnosis not present

## 2023-08-03 DIAGNOSIS — I6782 Cerebral ischemia: Secondary | ICD-10-CM | POA: Diagnosis not present

## 2023-08-03 LAB — CBG MONITORING, ED
Glucose-Capillary: 145 mg/dL — ABNORMAL HIGH (ref 70–99)
Glucose-Capillary: 155 mg/dL — ABNORMAL HIGH (ref 70–99)
Glucose-Capillary: 45 mg/dL — ABNORMAL LOW (ref 70–99)
Glucose-Capillary: 69 mg/dL — ABNORMAL LOW (ref 70–99)
Glucose-Capillary: 131 mg/dL — ABNORMAL HIGH (ref 70–99)

## 2023-08-03 NOTE — ED Notes (Addendum)
Called in to room, pt thinks blood sugar is low. CBG 45. Pt not symptomatic. Pt given 8 oz orange juice - drank 6 oz immediately. Grahams and peanut butter given. Pt ate 2 while I was in room. EDP notified. Will recheck 1630

## 2023-08-03 NOTE — ED Notes (Signed)
PT IS VOL TOC PENDING PLACEMENT.

## 2023-08-03 NOTE — ED Provider Notes (Signed)
-----------------------------------------   3:35 PM on 08/03/2023 -----------------------------------------   Blood pressure (!) 171/84, pulse 64, temperature 97.9 F (36.6 C), temperature source Oral, resp. rate 16, height 5\' 2"  (1.575 m), weight 59 kg, SpO2 98%.  The patient is calm and cooperative at this time.  There have been no acute events since the last update.  Awaiting disposition plan from case management/social work.    Corena Herter, MD 08/03/23 1536

## 2023-08-03 NOTE — Progress Notes (Signed)
Physical Therapy Treatment Patient Details Name: [name] MRN: [id number] DOB: 1929-09-10 Today's Date: 08/03/2023   History of Present Illness Pt is a 89 y/o F who presented to the hospital after a fall & suffering facial trauma. Pt's family reports pt's cognition has be declining over the past few months & pt has had multiple falls.  CT shows minimally displaced B nasal bone fxs. PMH: HTN, HLD, Lewy body dementia, DM2, paroxysmal a-fib    PT Comments  Patient was agreeable to PT session. LE exercises performed for strengthening independently from chair. Patient was challenged with head turns with ambulation with mild staggering noted. Education provided for fall prevention and to avoid distraction with ambulation. Patient is making progress with functional independence. Recommend to continue PT to maximize independence and facilitate return to prior level of function.    If plan is discharge home, recommend the following: A little help with walking and/or transfers;A little help with bathing/dressing/bathroom;Assistance with cooking/housework;Direct supervision/assist for financial management;Supervision due to cognitive status;Assist for transportation;Help with stairs or ramp for entrance;Direct supervision/assist for medications management   Can travel by private vehicle     Yes  Equipment Recommendations  None recommended by PT    Recommendations for Other Services       Precautions / Restrictions Precautions Precautions: Fall Restrictions Weight Bearing Restrictions: No     Mobility  Bed Mobility               General bed mobility comments: not assessed as patient sitting up on arrival and post session    Transfers Overall transfer level: Needs assistance Equipment used: Rolling walker (2 wheels) Transfers: Sit to/from Stand Sit to Stand: Supervision           General transfer comment: reinforced importance to push from chair and avoid pulling up  on rolling walker for safety which patient demonstrated without difficulty    Ambulation/Gait Ambulation/Gait assistance: Supervision Gait Distance (Feet): 30 Feet Assistive device: Rolling walker (2 wheels) Gait Pattern/deviations: Step-through pattern Gait velocity: decreased     General Gait Details: when walking without challenges, patient required only supervision. when challenged with head turns and looking up/down while walking, mild staggering with decreased step length noted. educated how distraction and additional movement while ambulating could lead to falls.   Stairs             Wheelchair Mobility     Tilt Bed    Modified Rankin (Stroke Patients Only)       Balance Overall balance assessment: Needs assistance Sitting-balance support: Feet supported, No upper extremity supported Sitting balance-Leahy Scale: Good     Standing balance support: Single extremity supported Standing balance-Leahy Scale: Good Standing balance comment: static standing                            Cognition Arousal: Alert Behavior During Therapy: WFL for tasks assessed/performed Overall Cognitive Status: Within Functional Limits for tasks assessed                                 General Comments: patient is able to follow single step commands without difficulty. she is cooperative throughout session        Exercises General Exercises - Lower Extremity Long Arc Quad: AROM, Strengthening, Both, 10 reps, Seated Hip ABduction/ADduction: AROM, Strengthening, Both, 10 reps, Seated Straight Leg Raises: AROM, Strengthening, Both, 10 reps,  Seated Hip Flexion/Marching: AROM, Strengthening, Both, 10 reps, Seated Other Exercises Other Exercises: cues for exercise technique for strengthening    General Comments        Pertinent Vitals/Pain Pain Assessment Pain Assessment: No/denies pain    Home Living                          Prior  Function            PT Goals (current goals can now be found in the care plan section) Acute Rehab PT Goals Patient Stated Goal: feel better PT Goal Formulation: With patient Time For Goal Achievement: 08/07/23 Potential to Achieve Goals: Good Progress towards PT goals: Progressing toward goals    Frequency    Min 1X/week      PT Plan      Co-evaluation              AM-PAC PT "6 Clicks" Mobility   Outcome Measure  Help needed turning from your back to your side while in a flat bed without using bedrails?: None Help needed moving from lying on your back to sitting on the side of a flat bed without using bedrails?: A Little Help needed moving to and from a bed to a chair (including a wheelchair)?: A Little Help needed standing up from a chair using your arms (e.g., wheelchair or bedside chair)?: None Help needed to walk in hospital room?: None Help needed climbing 3-5 steps with a railing? : A Little 6 Click Score: 21    End of Session   Activity Tolerance: Patient tolerated treatment well Patient left: in chair;with call bell/phone within reach Nurse Communication: Mobility status PT Visit Diagnosis: Unsteadiness on feet (R26.81);Muscle weakness (generalized) (M62.81)     Time: 1610-9604 PT Time Calculation (min) (ACUTE ONLY): 14 min  Charges:    $Therapeutic Activity: 8-22 mins PT General Charges $$ ACUTE PT VISIT: 1 Visit                     Donna Bernard, PT, MPT    Ina Homes 08/03/2023, 2:01 PM

## 2023-08-03 NOTE — ED Notes (Addendum)
RN cleaned room, threw away excess trash, wiped down surfaces, and replaced linens.

## 2023-08-03 NOTE — ED Notes (Signed)
Room mopped by housekeeping.

## 2023-08-03 NOTE — Progress Notes (Signed)
Mobility Specialist - Progress Note    08/03/23 1029  Mobility  Activity Ambulated with assistance in hallway;Stood at bedside  Level of Assistance Standby assist, set-up cues, supervision of patient - no hands on  Assistive Device Front wheel walker  Distance Ambulated (ft) 320 ft  Range of Motion/Exercises Active  Activity Response Tolerated well  Mobility Referral Yes  $Mobility charge 1 Mobility  Mobility Specialist Start Time (ACUTE ONLY) 1000  Mobility Specialist Stop Time (ACUTE ONLY) 1024  Mobility Specialist Time Calculation (min) (ACUTE ONLY) 24 min   Pt resting in recliner on RA upon entry. Pt STS and ambulates to hallway around NS SBA with RW. Pt endorses no pain or dizziness. Pt said she did have a headache but it went away. Pt returned to recliner and left with needs in reach.   Johnathan Hausen Mobility Specialist 08/03/23, 10:37 AM

## 2023-08-03 NOTE — ED Notes (Signed)
Patient provided snack at appropriate snack time.  Pt consumed 100% of snack provided, tolerated well w/o complaints   Trash disposted of appropriately by patient.  

## 2023-08-03 NOTE — ED Notes (Signed)
Pt given dinner tray. Tray setup, food cut up so pt can use spoon to eat.

## 2023-08-03 NOTE — ED Notes (Signed)
Pt completed visit with mobility specialist.

## 2023-08-04 DIAGNOSIS — I480 Paroxysmal atrial fibrillation: Secondary | ICD-10-CM | POA: Diagnosis not present

## 2023-08-04 DIAGNOSIS — S0083XA Contusion of other part of head, initial encounter: Secondary | ICD-10-CM | POA: Diagnosis not present

## 2023-08-04 DIAGNOSIS — S022XXA Fracture of nasal bones, initial encounter for closed fracture: Secondary | ICD-10-CM | POA: Diagnosis not present

## 2023-08-04 DIAGNOSIS — I6782 Cerebral ischemia: Secondary | ICD-10-CM | POA: Diagnosis not present

## 2023-08-04 DIAGNOSIS — R2681 Unsteadiness on feet: Secondary | ICD-10-CM | POA: Diagnosis not present

## 2023-08-04 DIAGNOSIS — E119 Type 2 diabetes mellitus without complications: Secondary | ICD-10-CM | POA: Diagnosis not present

## 2023-08-04 DIAGNOSIS — M6281 Muscle weakness (generalized): Secondary | ICD-10-CM | POA: Diagnosis not present

## 2023-08-04 DIAGNOSIS — E785 Hyperlipidemia, unspecified: Secondary | ICD-10-CM | POA: Diagnosis not present

## 2023-08-04 DIAGNOSIS — M5030 Other cervical disc degeneration, unspecified cervical region: Secondary | ICD-10-CM | POA: Diagnosis not present

## 2023-08-04 DIAGNOSIS — S0012XA Contusion of left eyelid and periocular area, initial encounter: Secondary | ICD-10-CM | POA: Diagnosis not present

## 2023-08-04 DIAGNOSIS — I10 Essential (primary) hypertension: Secondary | ICD-10-CM | POA: Diagnosis not present

## 2023-08-04 DIAGNOSIS — M47812 Spondylosis without myelopathy or radiculopathy, cervical region: Secondary | ICD-10-CM | POA: Diagnosis not present

## 2023-08-04 DIAGNOSIS — S0011XA Contusion of right eyelid and periocular area, initial encounter: Secondary | ICD-10-CM | POA: Diagnosis not present

## 2023-08-04 DIAGNOSIS — R296 Repeated falls: Secondary | ICD-10-CM | POA: Diagnosis not present

## 2023-08-04 LAB — CBG MONITORING, ED
Glucose-Capillary: 169 mg/dL — ABNORMAL HIGH (ref 70–99)
Glucose-Capillary: 93 mg/dL (ref 70–99)
Glucose-Capillary: 150 mg/dL — ABNORMAL HIGH (ref 70–99)
Glucose-Capillary: 113 mg/dL — ABNORMAL HIGH (ref 70–99)

## 2023-08-04 NOTE — ED Notes (Signed)
Pt table cleaned and trash disposed of, breakfast tray set up for pt.

## 2023-08-04 NOTE — [place] FL2 (Signed)
[place] MEDICAID FL2 LEVEL OF CARE FORM     IDENTIFICATION  Patient Name: [name] Birthdate: [birth year] Sex: female Admission Date (Current Location): 07/23/2023  [place] and [place] Number:  Chiropodist and Address:  [facility], [address], [place], [place] [address]      Provider Number: [id number]  Attending Physician Name and Address:  No att. providers found  Relative Name and Phone Number:  Carraway,James (Son)  [phone] [facility])    Current Level of Care: Hospital Recommended Level of Care: Memory Care Prior Approval Number:    Date Approved/Denied:   PASRR Number: [id number]  Discharge Plan: Other (Comment) ([facility])    Current Diagnoses: Patient Active Problem List   Diagnosis Date Noted   Hospital discharge follow-up 08/17/2022   Acute respiratory failure with hypoxia (HCC) 08/04/2022   Pressure injury of skin 07/31/2022   Acute on chronic combined systolic and diastolic CHF (congestive heart failure) (HCC) 07/30/2022   Elevated troponin 07/30/2022   Iron deficiency anemia 06/21/2022   Community acquired pneumonia    CHF (congestive heart failure) (HCC) 06/15/2022   Pain of left hip joint 03/08/2022   History of falling 07/14/2021   COPD (chronic obstructive pulmonary disease) (HCC) 07/14/2021   Neurocognitive disorder with Lewy bodies (CODE) (HCC) 07/07/2021   Atherosclerosis of native arteries of the extremities with ulceration (HCC) 04/07/2021   PSVT (paroxysmal supraventricular tachycardia) (HCC) 02/11/2021   Acute metabolic encephalopathy 02/10/2021   Frequent falls 02/10/2021   COVID-19 virus infection 02/10/2021   Chronic kidney disease, stage 3a (HCC) 02/10/2021   Dementia (HCC) 02/10/2021   Garbled speech, intermittent 02/10/2021   Laceration without foreign body, left ankle, subsequent encounter 02/10/2021   AMS (altered mental status) 02/10/2021   Osteoarthritis of  left shoulder 10/27/2020   Type 2 diabetes mellitus with diabetic peripheral angiopathy without gangrene (HCC) 09/27/2020   Personal history of malignant melanoma of skin 09/27/2020   Long term (current) use of insulin (HCC) 09/27/2020   Coronary artery disease involving native coronary artery of native heart 04/17/2020   Celiac artery stenosis (HCC) 06/12/2019   Bilateral lower abdominal cramping 04/20/2019   UTI symptoms 04/20/2019   History of rectal bleeding 03/26/2019   DM type 2 with diabetic peripheral neuropathy (HCC) 12/14/2018   Arthropathy of lumbar facet joint 09/04/2018   Degeneration of lumbar intervertebral disc 09/04/2018   Spondylolisthesis, grade 1 09/04/2018   Lewy body dementia (HCC) 02/17/2018   PVD (peripheral vascular disease) (HCC) 08/16/2017   Pain in limb 07/27/2016   Hallucinations 07/07/2016   Chest pain 07/01/2016   Depression 06/23/2016   Hypokalemia 09/03/2015   Insomnia 07/23/2015   Headache 07/23/2015   Type 2 diabetes mellitus with vascular disease (HCC) 06/24/2015   Chronic vulvitis 06/02/2015   Allergic rhinitis 05/30/2015   Anemia 05/30/2015   Back ache 05/30/2015   Body mass index (BMI) of 29.0-29.9 in adult 05/30/2015   Edema 05/30/2015   Dilatation of esophagus 05/30/2015   Fall 05/30/2015   H/O deep venous thrombosis 05/30/2015   Hypercholesteremia 05/30/2015   Heart & renal disease, hypertensive, with heart failure (HCC) 05/30/2015   Hyponatremia 05/30/2015   Calculus of kidney 05/30/2015   Gonalgia 05/30/2015   Psoriasis 05/30/2015   Avitaminosis D 05/30/2015   Cough 04/01/2015   Combined fat and carbohydrate induced hyperlipemia 12/30/2014   Paroxysmal atrial fibrillation (HCC) 07/10/2014   Abnormal gait 07/08/2014   Anxiety state 07/08/2014   Chronic kidney disease  07/08/2014   Acid reflux 07/08/2014   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HCC) 07/08/2014   COPD exacerbation (HCC) 07/08/2014   Type II diabetes mellitus with renal  manifestations (HCC) 07/08/2014   Benign essential HTN 06/26/2014   Carotid artery narrowing 06/26/2014   Myalgia 06/26/2014   Basal cell carcinoma of face 05/01/2014   Disease of female genital organs 11/28/2012   Incomplete bladder emptying 09/05/2012   Excessive urination at night 08/15/2012   Basal cell carcinoma of ear 10/26/2011    Orientation RESPIRATION BLADDER Height & Weight     Self, Time, Place  Normal Continent Weight: 130 lb 1.1 oz (59 kg) Height:  5\' 2"  (157.5 cm)  BEHAVIORAL SYMPTOMS/MOOD NEUROLOGICAL BOWEL NUTRITION STATUS        Diet (regular)  AMBULATORY STATUS COMMUNICATION OF NEEDS Skin   Limited Assist Verbally Normal                       Personal Care Assistance Level of Assistance  Bathing, Feeding, Dressing Bathing Assistance: Limited assistance Feeding assistance: Limited assistance Dressing Assistance: Limited assistance     Functional Limitations Info             SPECIAL CARE FACTORS FREQUENCY                       Contractures Contractures Info: Not present    Additional Factors Info  Code Status, Allergies Code Status Info: DNR Allergies Info: Bacitracin-neomycin-polymyxin, Neomycin-bacitracin Zn-polymyx, Latex, Lidocaine, Aricept (Donepezil Hcl), Benzalkonium Chloride, Ibuprofen, Lidocaine Hcl, Valdecoxib, Albuterol, Tape, Triamcinolone           Current Medications (08/04/2023):  This is the current hospital active medication list Current Facility-Administered Medications  Medication Dose Route Frequency Provider Last Rate Last Admin   aspirin EC tablet 325 mg  325 mg Oral Daily Merwyn Katos, MD   325 mg at 08/04/23 1033   atorvastatin (LIPITOR) tablet 10 mg  10 mg Oral Daily Merwyn Katos, MD   10 mg at 08/04/23 1033   carvedilol (COREG) tablet 3.125 mg  3.125 mg Oral BID Merwyn Katos, MD   3.125 mg at 08/04/23 1033   clopidogrel (PLAVIX) tablet 75 mg  75 mg Oral Daily Merwyn Katos, MD   75 mg at 08/04/23  1033   Dupilumab SOAJ 300 mg  300 mg Subcutaneous Q14 Days Orson Aloe, RPH   300 mg at 07/27/23 1601   ezetimibe (ZETIA) tablet 10 mg  10 mg Oral Daily Merwyn Katos, MD   10 mg at 08/04/23 1033   FLUoxetine (PROZAC) capsule 10 mg  10 mg Oral TID Merwyn Katos, MD   10 mg at 08/04/23 1034   insulin aspart (novoLOG) injection 0-9 Units  0-9 Units Subcutaneous TID WC Merwyn Katos, MD   1 Units at 08/04/23 1143   insulin aspart (novoLOG) injection 8 Units  8 Units Subcutaneous TID WC Pilar Jarvis, MD   8 Units at 08/03/23 1253   insulin glargine-yfgn (SEMGLEE) injection 12 Units  12 Units Subcutaneous Daily Merwyn Katos, MD   12 Units at 08/04/23 1034   isosorbide mononitrate (IMDUR) 24 hr tablet 30 mg  30 mg Oral Daily Merwyn Katos, MD   30 mg at 08/04/23 1033   loratadine (CLARITIN) tablet 10 mg  10 mg Oral Daily Merwyn Katos, MD   10 mg at 08/04/23 1033   memantine (NAMENDA) tablet 10 mg  10  mg Oral BID Merwyn Katos, MD   10 mg at 08/04/23 1033   mirabegron ER (MYRBETRIQ) tablet 25 mg  25 mg Oral Daily Merwyn Katos, MD   25 mg at 08/04/23 1034   pantoprazole (PROTONIX) EC tablet 20 mg  20 mg Oral Daily Merwyn Katos, MD   20 mg at 08/04/23 1034   QUEtiapine (SEROQUEL) tablet 50 mg  50 mg Oral QHS Merwyn Katos, MD   50 mg at 08/04/23 0038   sacubitril-valsartan (ENTRESTO) 49-51 mg per tablet  1 tablet Oral BID Merwyn Katos, MD   1 tablet at 08/04/23 1034   vitamin E capsule 1,000 Units  1,000 Units Oral Daily Merwyn Katos, MD   1,000 Units at 08/04/23 1033   Current Outpatient Medications  Medication Sig Dispense Refill   atorvastatin (LIPITOR) 10 MG tablet TAKE 1 TABLET BY MOUTH DAILY 90 tablet 0   carvedilol (COREG) 3.125 MG tablet Take 1 tablet (3.125 mg total) by mouth 2 (two) times daily. 180 tablet 3   clopidogrel (PLAVIX) 75 MG tablet Take 1 tablet (75 mg total) by mouth daily. 30 tablet 3   ENTRESTO 49-51 MG TAKE ONE TABLET BY MOUTH TWICE DAILY 60  tablet 5   ezetimibe (ZETIA) 10 MG tablet TAKE 1 TABLET BY MOUTH DAILY 90 tablet 1   FLUoxetine (PROZAC) 10 MG capsule TAKE 1 CAPSULE BY MOUTH 3 TIMES DAILY. 90 capsule 3   HUMALOG KWIKPEN 100 UNIT/ML KwikPen Inject 3 Units into the skin 3 (three) times daily. (Patient taking differently: Inject 12-15 Units into the skin 3 (three) times daily. 12 units with breakfast, 15 units with lunch, and 15 units with dinner) 15 mL 11   insulin glargine (LANTUS) 100 UNIT/ML injection Inject 0.06 mLs (6 Units total) into the skin at bedtime. (Patient taking differently: Inject 12 Units into the skin at bedtime.) 10 mL 11   isosorbide mononitrate (IMDUR) 30 MG 24 hr tablet Take 1 tablet (30 mg total) by mouth daily. Take 30 mg by mouth daily. 30 tablet 0   lansoprazole (PREVACID) 30 MG capsule TAKE 1 CAPSULE BY MOUTH ONCE DAILY AT NOON (Patient taking differently: Take 30 mg by mouth daily at 12 noon.) 90 capsule 1   memantine (NAMENDA) 10 MG tablet Take 10 mg by mouth 2 (two) times daily.     QUEtiapine (SEROQUEL) 50 MG tablet Take 50 mg by mouth at bedtime.     Vibegron (GEMTESA) 75 MG TABS Take 1 tablet (75 mg total) by mouth daily. 30 tablet 11   vitamin E 1000 UNIT capsule Take 1,000 Units by mouth daily.     aspirin EC 325 MG tablet Take 325 mg by mouth daily.     Dupilumab (DUPIXENT) 300 MG/2ML SOAJ Inject 300 mg into the skin every 14 (fourteen) days. Starting at day 15 for maintenance. Please schedule follow up 4 mL 1   fexofenadine (ALLEGRA) 180 MG tablet Take 180 mg by mouth daily.     glucose blood test strip ACCU-CHEK AVIVA PLUS TEST STRP Use to check sugars 3 times daily.     Multiple Vitamins-Minerals (ICAPS AREDS 2 PO) Take 2 capsules by mouth daily.       Discharge Medications: Please see discharge summary for a list of discharge medications.  Relevant Imaging Results:  Relevant Lab Results:   Additional Information SS #: [id number]  Darolyn Rua, LCSW

## 2023-08-04 NOTE — TOC Progression Note (Addendum)
Transition of Care [facility]) - Progression Note    Patient Details  Name: [name] MRN: [id number] Date of Birth: 13-Apr-1929  Transition of Care [facility]) CM/SW Contact  Darolyn Rua, [place] Phone Number: 08/04/2023, 9:18 AM  Clinical Narrative:     3:44 pm: Janet Berlin reports medicaid application was submitted, he spoke with Juliette Mangle at [phone]. Reports preference for [facility], CSW spoke with Elita Quick at [place] at [phone] she reports referral can be faxed to [phone], CSW has sent fL2 and clinicals.     CSW spoke with son Fayrene Fearing who reports he has completed special assistance medicaid paperwork and is going to social services today to give them the paperwork.   He reports he would be interested in speaking with [address] and [facility] for placement options. With private pay, [facility] was not feasible with pricing.   CSW called [facility] , lvm with admissions.       Expected Discharge Plan: Skilled Nursing Facility Barriers to Discharge: Continued Medical Work up  Expected Discharge Plan and Services       Living arrangements for the past 2 months: Single Family Home                                       Social Determinants of Health (SDOH) Interventions SDOH Screenings   Food Insecurity: No Food Insecurity (05/19/2023)   Received from [facility]  Housing: Low Risk  (11/08/2022)  Transportation Needs: No Transportation Needs (05/19/2023)   Received from [facility]  Utilities: Not At Risk (05/19/2023)   Received from [facility]  Alcohol Screen: Low Risk  (05/20/2022)  Depression (PHQ2-9): Low Risk  (06/21/2022)  Financial Resource Strain: Low Risk  (05/19/2023)   Received from [facility]  Physical Activity: Inactive (05/19/2023)   Received from [facility]  Social Connections: Moderately Isolated (05/20/2022)  Stress: No  Stress Concern Present (05/20/2022)  Tobacco Use: Low Risk  (07/23/2023)    Readmission Risk Interventions    08/02/2022    1:07 PM  Readmission Risk Prevention Plan  Transportation Screening Complete  Medication Review (RN Care Manager) Complete  PCP or Specialist appointment within 3-5 days of discharge Complete  HRI or Home Care Consult Complete  SW Recovery Care/Counseling Consult Complete  Palliative Care Screening Not Applicable  Skilled Nursing Facility Not Applicable

## 2023-08-04 NOTE — ED Notes (Signed)
Pt provided lunch tray.

## 2023-08-04 NOTE — Progress Notes (Signed)
Occupational Therapy Treatment Patient Details Name: [name] MRN: [id number] DOB: 1928-10-21 Today's Date: 08/04/2023   History of present illness Pt is a 89 y/o F who presented to the hospital after a fall & suffering facial trauma. Pt's family reports pt's cognition has be declining over the past few months & pt has had multiple falls.  CT shows minimally displaced B nasal bone fxs. PMH: HTN, HLD, Lewy body dementia, DM2, paroxysmal a-fib   OT comments  [name] was seen for OT treatment on this date. Upon arrival to room pt supine in bed, agreeable to OT Tx session. OT facilitated ADL management with education and assist as described below. See ADL section for additional details regarding occupational performance. Pt continues to be functionally limited by low vision, generalized weakness, and decreased cognition/safety awareness. Pt return verbalizes understanding of education provided t/o session, however, she demos limited carryover from past sessions. Pt is progressing toward OT goals and continues to benefit from skilled OT services to maximize return to PLOF and minimize risk of future falls, injury, caregiver burden, and readmission. Will continue to follow POC as written. Discharge recommendation remains appropriate.        If plan is discharge home, recommend the following:  A little help with walking and/or transfers;A little help with bathing/dressing/bathroom;Assistance with cooking/housework;Assist for transportation;Help with stairs or ramp for entrance;Supervision due to cognitive status   Equipment Recommendations  Other (comment) (defer)    Recommendations for Other Services      Precautions / Restrictions Precautions Precautions: Fall Restrictions Weight Bearing Restrictions: No       Mobility Bed Mobility Overal bed mobility: Needs Assistance Bed Mobility: Supine to Sit     Supine to sit: HOB elevated, Supervision           Transfers Overall transfer level: Needs assistance Equipment used: Rolling walker (2 wheels) Transfers: Sit to/from Stand Sit to Stand: Supervision           General transfer comment: reinforced importance to push from seated surface and avoid pulling up on rolling walker for safety which patient demonstrated without difficulty     Balance Overall balance assessment: Needs assistance Sitting-balance support: Feet supported, No upper extremity supported Sitting balance-Leahy Scale: Good Sitting balance - Comments: good balance for seated LB ADL management.   Standing balance support: Single extremity supported Standing balance-Leahy Scale: Good                             ADL either performed or assessed with clinical judgement   ADL Overall ADL's : Needs assistance/impaired                 Upper Body Dressing : Supervision/safety;Sitting;Set up;Cueing for compensatory techniques   Lower Body Dressing: Minimal assistance;Sit to/from stand Lower Body Dressing Details (indicate cue type and reason): MIN A to thread B legs through pants this date. Able to pull pants with elastic waist band over hips with CGA for safety. Toilet Transfer: Rolling walker (2 wheels);Stand-pivot;Contact guard assist Toilet Transfer Details (indicate cue type and reason): simulated to chair this date. Cueing to avoid obstacles in room 2/2 low vision         Functional mobility during ADLs: Supervision/safety;Rolling walker (2 wheels) General ADL Comments: cues during mobility 2/2 visual impairment.    Extremity/Trunk Assessment Upper Extremity Assessment Upper Extremity Assessment: Overall WFL for tasks assessed   Lower Extremity Assessment Lower Extremity Assessment: Overall [facility]  for tasks assessed   Cervical / Trunk Assessment Cervical / Trunk Assessment: Kyphotic    Vision Patient Visual Report: No change from baseline     Perception     Praxis      Cognition  Arousal: Alert Behavior During Therapy: WFL for tasks assessed/performed Overall Cognitive Status: History of cognitive impairments - at baseline                                 General Comments: Pleasant, conversational, eager to participate in therapy session. Requires intermittent re-direction to task at hand.        Exercises Other Exercises Other Exercises: Cueing for safety and assist as described t/o ADL session. See above for additional detail.    Shoulder Instructions       General Comments      Pertinent Vitals/ Pain       Pain Assessment Pain Assessment: No/denies pain Faces Pain Scale: Hurts a little bit Pain Location: RLE wound near calf. Pain Descriptors / Indicators: Discomfort Pain Intervention(s): Limited activity within patient's tolerance, Monitored during session  Home Living                                          Prior Functioning/Environment              Frequency  Min 1X/week        Progress Toward Goals  OT Goals(current goals can now be found in the care plan section)  Progress towards OT goals: Progressing toward goals  Acute Rehab OT Goals Patient Stated Goal: return home OT Goal Formulation: With patient Time For Goal Achievement: 08/18/23 Potential to Achieve Goals: Good ADL Goals Pt Will Transfer to Toilet: with set-up;with supervision;ambulating;regular height toilet  Plan      Co-evaluation                 AM-PAC OT "6 Clicks" Daily Activity     Outcome Measure   Help from another person eating meals?: A Little Help from another person taking care of personal grooming?: A Little Help from another person toileting, which includes using toliet, bedpan, or urinal?: A Little Help from another person bathing (including washing, rinsing, drying)?: A Little Help from another person to put on and taking off regular upper body clothing?: A Little Help from another person to put on and  taking off regular lower body clothing?: A Little 6 Click Score: 18    End of Session Equipment Utilized During Treatment: Rolling walker (2 wheels)  OT Visit Diagnosis: Unsteadiness on feet (R26.81);Other abnormalities of gait and mobility (R26.89);Muscle weakness (generalized) (M62.81);History of falling (Z91.81)   Activity Tolerance Patient tolerated treatment well   Patient Left with call bell/phone within reach;in chair   Nurse Communication Mobility status        Time: 1191-4782 OT Time Calculation (min): 17 min  Charges: OT General Charges $OT Visit: 1 Visit OT Treatments $Self Care/Home Management : 8-22 mins  Rockney Ghee, M.S., OTR/L 08/04/23, 10:32 AM

## 2023-08-05 DIAGNOSIS — S0083XA Contusion of other part of head, initial encounter: Secondary | ICD-10-CM | POA: Diagnosis not present

## 2023-08-05 DIAGNOSIS — I480 Paroxysmal atrial fibrillation: Secondary | ICD-10-CM | POA: Diagnosis not present

## 2023-08-05 DIAGNOSIS — M47812 Spondylosis without myelopathy or radiculopathy, cervical region: Secondary | ICD-10-CM | POA: Diagnosis not present

## 2023-08-05 DIAGNOSIS — S0011XA Contusion of right eyelid and periocular area, initial encounter: Secondary | ICD-10-CM | POA: Diagnosis not present

## 2023-08-05 DIAGNOSIS — E785 Hyperlipidemia, unspecified: Secondary | ICD-10-CM | POA: Diagnosis not present

## 2023-08-05 DIAGNOSIS — E119 Type 2 diabetes mellitus without complications: Secondary | ICD-10-CM | POA: Diagnosis not present

## 2023-08-05 DIAGNOSIS — R296 Repeated falls: Secondary | ICD-10-CM | POA: Diagnosis not present

## 2023-08-05 DIAGNOSIS — M6281 Muscle weakness (generalized): Secondary | ICD-10-CM | POA: Diagnosis not present

## 2023-08-05 DIAGNOSIS — S0012XA Contusion of left eyelid and periocular area, initial encounter: Secondary | ICD-10-CM | POA: Diagnosis not present

## 2023-08-05 DIAGNOSIS — S022XXA Fracture of nasal bones, initial encounter for closed fracture: Secondary | ICD-10-CM | POA: Diagnosis not present

## 2023-08-05 DIAGNOSIS — R2681 Unsteadiness on feet: Secondary | ICD-10-CM | POA: Diagnosis not present

## 2023-08-05 DIAGNOSIS — I6782 Cerebral ischemia: Secondary | ICD-10-CM | POA: Diagnosis not present

## 2023-08-05 DIAGNOSIS — M5030 Other cervical disc degeneration, unspecified cervical region: Secondary | ICD-10-CM | POA: Diagnosis not present

## 2023-08-05 DIAGNOSIS — I10 Essential (primary) hypertension: Secondary | ICD-10-CM | POA: Diagnosis not present

## 2023-08-05 LAB — CBG MONITORING, ED
Glucose-Capillary: 82 mg/dL (ref 70–99)
Glucose-Capillary: 118 mg/dL — ABNORMAL HIGH (ref 70–99)
Glucose-Capillary: 157 mg/dL — ABNORMAL HIGH (ref 70–99)
Glucose-Capillary: 181 mg/dL — ABNORMAL HIGH (ref 70–99)

## 2023-08-05 NOTE — ED Notes (Signed)
VOL  TOC  PLACEMENT 

## 2023-08-05 NOTE — ED Provider Notes (Signed)
Emergency Medicine Observation Re-evaluation Note  [name] is a 89 y.o. female, seen on rounds today.  Pt initially presented to the ED for complaints of Fall and Facial Injury  Currently, the patient is resting in bed. No reported issues from nursing team.   Physical Exam  BP (!) 178/67 (BP Location: Right Arm)   Pulse 66   Temp 98.9 F (37.2 C)   Resp 16   Ht 5\' 2"  (1.575 m)   Wt 59 kg   SpO2 96%   BMI 23.79 kg/m  Physical Exam General: Resting in bed  ED Course / MDM   BGL 82-150 last 24 hours  Plan  Current plan is for dispo per social work.    Trinna Post, MD 08/05/23 [phone]

## 2023-08-05 NOTE — ED Notes (Signed)
Received report from Purvis Kilts, RN

## 2023-08-05 NOTE — ED Notes (Signed)
VOL/Pending TOC Placement

## 2023-08-05 NOTE — Progress Notes (Signed)
Physical Therapy Treatment Patient Details Name: [name] MRN: [id number] DOB: 1928-11-12 Today's Date: 08/05/2023   History of Present Illness Pt is a 89 y/o F who presented to the hospital after a fall & suffering facial trauma. Pt's family reports pt's cognition has be declining over the past few months & pt has had multiple falls.  CT shows minimally displaced B nasal bone fxs. PMH: HTN, HLD, Lewy body dementia, DM2, paroxysmal a-fib    PT Comments  Pt seated in a guest chair in the doorway of her room (ED26), RW placed over legs. Pt is pleasant and social, agreeable to AMB, says she likes to walk, but gets yelled at by staff every time she tries to get up here. She feels like she's going to get fat from just sitting and eating here all day. Pt AMB fully through th entire ED, given cues for directional navigation, moves at an appropriate speed, no pain, no fatigue. Pt reveals safe use of RW, reports to have years of experience. Pt's commentary reveals limited insight into her current situation, questions why she simply hasn't been sent home in weeks, claims to have not seen a doctor entire admission here. Will continue to follow.     If plan is discharge home, recommend the following: A little help with walking and/or transfers;A little help with bathing/dressing/bathroom;Assistance with cooking/housework;Direct supervision/assist for financial management;Supervision due to cognitive status;Assist for transportation;Help with stairs or ramp for entrance;Direct supervision/assist for medications management   Can travel by private vehicle        Equipment Recommendations  None recommended by PT    Recommendations for Other Services       Precautions / Restrictions Precautions Precautions: Fall Restrictions Weight Bearing Restrictions: No     Mobility  Bed Mobility                    Transfers Overall transfer level: Needs assistance Equipment used: Rolling  walker (2 wheels) Transfers: Sit to/from Stand Sit to Stand: Min assist           General transfer comment: chair sliding out from her, authro provides cahir stabilization for rise    Ambulation/Gait Ambulation/Gait assistance: Supervision, Contact guard assist Gait Distance (Feet):  (>564ft) Assistive device: Rolling walker (2 wheels) Gait Pattern/deviations: Step-through pattern       General Gait Details: moving quite well, consistent pattern over time; safe use of RW; AMB from ED26 down to ED7 then Right down toward ED psych and a full loop around ED pod C (40s-50s) prior to return along this same path.   Stairs             Wheelchair Mobility     Tilt Bed    Modified Rankin (Stroke Patients Only)       Balance                                            Cognition Arousal: Alert Behavior During Therapy: WFL for tasks assessed/performed Overall Cognitive Status: History of cognitive impairments - at baseline                                          Exercises      General Comments  Pertinent Vitals/Pain Pain Assessment Pain Assessment: No/denies pain    Home Living                          Prior Function            PT Goals (current goals can now be found in the care plan section) Acute Rehab PT Goals Patient Stated Goal: feel better PT Goal Formulation: With patient Time For Goal Achievement: 08/07/23 Potential to Achieve Goals: Good Progress towards PT goals: Progressing toward goals    Frequency    Min 1X/week      PT Plan      Co-evaluation              AM-PAC PT "6 Clicks" Mobility   Outcome Measure  Help needed turning from your back to your side while in a flat bed without using bedrails?: None Help needed moving from lying on your back to sitting on the side of a flat bed without using bedrails?: A Little Help needed moving to and from a bed to a chair  (including a wheelchair)?: A Little Help needed standing up from a chair using your arms (e.g., wheelchair or bedside chair)?: A Little Help needed to walk in hospital room?: A Little Help needed climbing 3-5 steps with a railing? : A Little 6 Click Score: 19    End of Session Equipment Utilized During Treatment: Gait belt Activity Tolerance: Patient tolerated treatment well;No increased pain Patient left: in chair Nurse Communication: Mobility status PT Visit Diagnosis: Unsteadiness on feet (R26.81);Muscle weakness (generalized) (M62.81)     Time: 8841-6606 PT Time Calculation (min) (ACUTE ONLY): 8 min  Charges:    $Therapeutic Activity: 8-22 mins PT General Charges $$ ACUTE PT VISIT: 1 Visit                    3:30 PM, 08/05/23 [name], PT, DPT Physical Therapist - [facility]  [phone] (ASCOM)    [name] C 08/05/2023, 3:27 PM

## 2023-08-06 DIAGNOSIS — S022XXA Fracture of nasal bones, initial encounter for closed fracture: Secondary | ICD-10-CM | POA: Diagnosis not present

## 2023-08-06 DIAGNOSIS — E119 Type 2 diabetes mellitus without complications: Secondary | ICD-10-CM | POA: Diagnosis not present

## 2023-08-06 DIAGNOSIS — E785 Hyperlipidemia, unspecified: Secondary | ICD-10-CM | POA: Diagnosis not present

## 2023-08-06 DIAGNOSIS — R296 Repeated falls: Secondary | ICD-10-CM | POA: Diagnosis not present

## 2023-08-06 DIAGNOSIS — M5030 Other cervical disc degeneration, unspecified cervical region: Secondary | ICD-10-CM | POA: Diagnosis not present

## 2023-08-06 DIAGNOSIS — I480 Paroxysmal atrial fibrillation: Secondary | ICD-10-CM | POA: Diagnosis not present

## 2023-08-06 DIAGNOSIS — M47812 Spondylosis without myelopathy or radiculopathy, cervical region: Secondary | ICD-10-CM | POA: Diagnosis not present

## 2023-08-06 DIAGNOSIS — S0083XA Contusion of other part of head, initial encounter: Secondary | ICD-10-CM | POA: Diagnosis not present

## 2023-08-06 DIAGNOSIS — R2681 Unsteadiness on feet: Secondary | ICD-10-CM | POA: Diagnosis not present

## 2023-08-06 DIAGNOSIS — I6782 Cerebral ischemia: Secondary | ICD-10-CM | POA: Diagnosis not present

## 2023-08-06 DIAGNOSIS — S0012XA Contusion of left eyelid and periocular area, initial encounter: Secondary | ICD-10-CM | POA: Diagnosis not present

## 2023-08-06 DIAGNOSIS — M6281 Muscle weakness (generalized): Secondary | ICD-10-CM | POA: Diagnosis not present

## 2023-08-06 DIAGNOSIS — S0011XA Contusion of right eyelid and periocular area, initial encounter: Secondary | ICD-10-CM | POA: Diagnosis not present

## 2023-08-06 DIAGNOSIS — I10 Essential (primary) hypertension: Secondary | ICD-10-CM | POA: Diagnosis not present

## 2023-08-06 LAB — CBG MONITORING, ED
Glucose-Capillary: 92 mg/dL (ref 70–99)
Glucose-Capillary: 167 mg/dL — ABNORMAL HIGH (ref 70–99)
Glucose-Capillary: 166 mg/dL — ABNORMAL HIGH (ref 70–99)

## 2023-08-06 NOTE — ED Notes (Signed)
Pt is sitting in the hallway talking to the staff members. No complaints voiced or observed.

## 2023-08-06 NOTE — ED Notes (Signed)
VOL TOC placement 

## 2023-08-06 NOTE — ED Provider Notes (Signed)
Emergency Medicine Observation Re-evaluation Note  [name] is a 89 y.o. female, currently boarding in the emergency department.  No acute events since last update.  Physical Exam  BP (!) 145/58 (BP Location: Right Arm)   Pulse 74   Temp 98.8 F (37.1 C) (Oral)   Resp 17   Ht 5\' 2"  (1.575 m)   Wt 59 kg   SpO2 95%   BMI 23.79 kg/m   ED Course / MDM   Patient's recent CBGs appear well-controlled.  No other recent lab work available for review.  Plan  Current plan is for placement to an abrupt living facility once available.  Social worker is currently working with the patient to achieve this.Minna Antis, MD 08/06/23 [phone]

## 2023-08-07 DIAGNOSIS — S0011XA Contusion of right eyelid and periocular area, initial encounter: Secondary | ICD-10-CM | POA: Diagnosis not present

## 2023-08-07 DIAGNOSIS — I6782 Cerebral ischemia: Secondary | ICD-10-CM | POA: Diagnosis not present

## 2023-08-07 DIAGNOSIS — S0012XA Contusion of left eyelid and periocular area, initial encounter: Secondary | ICD-10-CM | POA: Diagnosis not present

## 2023-08-07 DIAGNOSIS — S022XXA Fracture of nasal bones, initial encounter for closed fracture: Secondary | ICD-10-CM | POA: Diagnosis not present

## 2023-08-07 DIAGNOSIS — M6281 Muscle weakness (generalized): Secondary | ICD-10-CM | POA: Diagnosis not present

## 2023-08-07 DIAGNOSIS — M47812 Spondylosis without myelopathy or radiculopathy, cervical region: Secondary | ICD-10-CM | POA: Diagnosis not present

## 2023-08-07 DIAGNOSIS — S0083XA Contusion of other part of head, initial encounter: Secondary | ICD-10-CM | POA: Diagnosis not present

## 2023-08-07 DIAGNOSIS — E785 Hyperlipidemia, unspecified: Secondary | ICD-10-CM | POA: Diagnosis not present

## 2023-08-07 DIAGNOSIS — R2681 Unsteadiness on feet: Secondary | ICD-10-CM | POA: Diagnosis not present

## 2023-08-07 DIAGNOSIS — R296 Repeated falls: Secondary | ICD-10-CM | POA: Diagnosis not present

## 2023-08-07 DIAGNOSIS — I10 Essential (primary) hypertension: Secondary | ICD-10-CM | POA: Diagnosis not present

## 2023-08-07 DIAGNOSIS — M5030 Other cervical disc degeneration, unspecified cervical region: Secondary | ICD-10-CM | POA: Diagnosis not present

## 2023-08-07 DIAGNOSIS — I480 Paroxysmal atrial fibrillation: Secondary | ICD-10-CM | POA: Diagnosis not present

## 2023-08-07 DIAGNOSIS — E119 Type 2 diabetes mellitus without complications: Secondary | ICD-10-CM | POA: Diagnosis not present

## 2023-08-07 LAB — CBG MONITORING, ED
Glucose-Capillary: 79 mg/dL (ref 70–99)
Glucose-Capillary: 48 mg/dL — ABNORMAL LOW (ref 70–99)
Glucose-Capillary: 78 mg/dL (ref 70–99)
Glucose-Capillary: 168 mg/dL — ABNORMAL HIGH (ref 70–99)
Glucose-Capillary: 180 mg/dL — ABNORMAL HIGH (ref 70–99)

## 2023-08-07 MED ORDER — INSULIN GLARGINE-YFGN 100 UNIT/ML ~~LOC~~ SOLN
10.0000 [IU] | Freq: Every day | SUBCUTANEOUS | Status: DC
  Administered 2023-08-08 – 2023-08-23 (×16): 10 [IU] via SUBCUTANEOUS
  Filled 2023-08-07 (×16): qty 0.1

## 2023-08-07 MED ORDER — INSULIN ASPART 100 UNIT/ML IJ SOLN
5.0000 [IU] | Freq: Three times a day (TID) | INTRAMUSCULAR | Status: DC
  Administered 2023-08-07: 5 [IU] via SUBCUTANEOUS
  Filled 2023-08-07: qty 1

## 2023-08-07 NOTE — ED Provider Notes (Signed)
Emergency Medicine Observation Re-evaluation Note  [name] is a 89 y.o. female, currently boarding in the emergency department.  Patient did have an episode of hypoglycemia, however we have reduced the patient's sliding scale insulin and more recent blood sugars appear normal to hyperglycemic.  Physical Exam  BP 136/61 (BP Location: Right Arm)   Pulse 63   Temp (!) 97.5 F (36.4 C) (Oral)   Resp 18   Ht 5\' 2"  (1.575 m)   Wt 59 kg   SpO2 99%   BMI 23.79 kg/m    ED Course / MDM   Patient is recent fingerstick blood glucose did get a 48 reading, however after eating and adjusting the patient's insulin regimen blood sugar since then have been well-regulated.  Plan  Current plan is for placement to an appropriate living facility once available.  Social worker is currently working with the patient to achieve this.    Minna Antis, MD 08/07/23 2029

## 2023-08-07 NOTE — ED Notes (Signed)
vol/toc.... 

## 2023-08-07 NOTE — Progress Notes (Signed)
Mobility Specialist - Progress Note     08/07/23 1449  Mobility  Activity Ambulated with assistance in hallway  Level of Assistance Standby assist, set-up cues, supervision of patient - no hands on  Assistive Device Front wheel walker  Range of Motion/Exercises Active  Activity Response Tolerated well  Mobility Referral Yes  $Mobility charge 1 Mobility  Mobility Specialist Start Time (ACUTE ONLY) 1432  Mobility Specialist Stop Time (ACUTE ONLY) 1449  Mobility Specialist Time Calculation (min) (ACUTE ONLY) 17 min   Pt resting in recliner on RA upon entry. Pt STS and ambulates to hallway around NS SBA with RW. Pt pleasant during walk and agreeable. Pt returned to recliner and left with needs in reach. RN/Paramedic notified of blood staining on chuck pad but, patient clear of any blood sites or scrapes.   Johnathan Hausen Mobility Specialist 08/07/23, 2:58 PM

## 2023-08-07 NOTE — ED Provider Notes (Addendum)
Repeat glucose 168  Awaiting diabetes coordinator consultation.  Blood glucose has improved.  Insulin regimen has been decreased slightly.  Ongoing care including discussion of medication changes and her previous asymptomatic but present hypoglycemic episode also discussed with oncoming partner Dr. Prescott Gum, MD 08/07/23 1911  Diabetes coordinator consult now noted, recommends discontinuation of regular short acting meal coverage.  I have since discontinued meal coverage per diabetes coordinator recommendation    Sharyn Creamer, MD 08/07/23 1912

## 2023-08-07 NOTE — Inpatient Diabetes Management (Signed)
Inpatient Diabetes Program Recommendations  AACE/ADA: New Consensus Statement on Inpatient Glycemic Control (2015)  Target Ranges:  Prepandial:   less than 140 mg/dL      Peak postprandial:   less than 180 mg/dL (1-2 hours)      Critically ill patients:  140 - 180 mg/dL   Lab Results  Component Value Date   GLUCAP 48 (L) 08/07/2023   HGBA1C 8.4 (H) 07/23/2023    Review of Glycemic Control  Latest Reference Range & Units 08/06/23 07:55 08/06/23 12:42 08/06/23 16:39 08/07/23 07:27 08/07/23 11:10  Glucose-Capillary 70 - 99 mg/dL 92 295 (H) 621 (H) 79 48 (L)   Diabetes history: DM 2 Outpatient Diabetes medications: Humalog 12 units breakfast, 15 units lunch, 15 units dinner, Lantus 12 units qhs Current orders for Inpatient glycemic control:  Semglee 10 units Daily Novolog 5 units tid meal coverage Novolog 0-9 units tid  A1c  8.4%  Inpatient Diabetes Program Recommendations:    Note Pt not consistently receiving Novolog meal coverage and glucose trends remain stable until pt had hypoglycemia after receiving meal coverage this am.  -  d/c Novolog meal coverage and watch trends on correction scale.  Thanks,  Christena Deem RN, MSN, BC-ADM Inpatient Diabetes Coordinator Team Pager [phone] (8a-5p)

## 2023-08-07 NOTE — ED Notes (Signed)
Pt received dinner. 

## 2023-08-07 NOTE — ED Provider Notes (Signed)
-----------------------------------------   4:41 AM on 08/07/2023 -----------------------------------------   Blood pressure (!) 175/75, pulse 70, temperature 97.9 F (36.6 C), temperature source Oral, resp. rate 18, height 1.575 m (5\' 2" ), weight 59 kg, SpO2 96%.  The patient is calm and cooperative at this time.  There have been no acute events since the last update.  Awaiting disposition plan from [facility] team.   Loleta Rose, MD 08/07/23 [phone]

## 2023-08-07 NOTE — ED Provider Notes (Signed)
Patient is fully awake alert ate breakfast.  Glucose noted to be 48.  Currently drinking 2 apple juice.  She is in no distress  We will recheck glucose in 1 hour  I have placed a consult to the diabetes coordinator as well to review her insulin regimen and glucoses for further treat and/or tailoring.     Sharyn Creamer, MD 08/07/23 1115

## 2023-08-07 NOTE — ED Notes (Signed)
MD Quale notified. 2 Apple juice given. Recheck in an hour.

## 2023-08-08 DIAGNOSIS — I480 Paroxysmal atrial fibrillation: Secondary | ICD-10-CM | POA: Diagnosis not present

## 2023-08-08 DIAGNOSIS — S0012XA Contusion of left eyelid and periocular area, initial encounter: Secondary | ICD-10-CM | POA: Diagnosis not present

## 2023-08-08 DIAGNOSIS — E785 Hyperlipidemia, unspecified: Secondary | ICD-10-CM | POA: Diagnosis not present

## 2023-08-08 DIAGNOSIS — I6782 Cerebral ischemia: Secondary | ICD-10-CM | POA: Diagnosis not present

## 2023-08-08 DIAGNOSIS — E119 Type 2 diabetes mellitus without complications: Secondary | ICD-10-CM | POA: Diagnosis not present

## 2023-08-08 DIAGNOSIS — M6281 Muscle weakness (generalized): Secondary | ICD-10-CM | POA: Diagnosis not present

## 2023-08-08 DIAGNOSIS — R2681 Unsteadiness on feet: Secondary | ICD-10-CM | POA: Diagnosis not present

## 2023-08-08 DIAGNOSIS — R296 Repeated falls: Secondary | ICD-10-CM | POA: Diagnosis not present

## 2023-08-08 DIAGNOSIS — S0083XA Contusion of other part of head, initial encounter: Secondary | ICD-10-CM | POA: Diagnosis not present

## 2023-08-08 DIAGNOSIS — M5030 Other cervical disc degeneration, unspecified cervical region: Secondary | ICD-10-CM | POA: Diagnosis not present

## 2023-08-08 DIAGNOSIS — S0011XA Contusion of right eyelid and periocular area, initial encounter: Secondary | ICD-10-CM | POA: Diagnosis not present

## 2023-08-08 DIAGNOSIS — S022XXA Fracture of nasal bones, initial encounter for closed fracture: Secondary | ICD-10-CM | POA: Diagnosis not present

## 2023-08-08 DIAGNOSIS — M47812 Spondylosis without myelopathy or radiculopathy, cervical region: Secondary | ICD-10-CM | POA: Diagnosis not present

## 2023-08-08 DIAGNOSIS — I10 Essential (primary) hypertension: Secondary | ICD-10-CM | POA: Diagnosis not present

## 2023-08-08 LAB — CBG MONITORING, ED
Glucose-Capillary: 79 mg/dL (ref 70–99)
Glucose-Capillary: 155 mg/dL — ABNORMAL HIGH (ref 70–99)
Glucose-Capillary: 133 mg/dL — ABNORMAL HIGH (ref 70–99)
Glucose-Capillary: 233 mg/dL — ABNORMAL HIGH (ref 70–99)

## 2023-08-08 NOTE — Progress Notes (Signed)
PT Cancellation Note  Patient Details Name: [name] MRN: [id number] DOB: 31-Jul-1929   Cancelled Treatment:    Reason Eval/Treat Not Completed: Other (comment). Pt reported she had just gone for a walk, declined mobility at this time. PT to re-attempt as able.    Olga Coaster PT, DPT 12:45 PM,08/08/23

## 2023-08-08 NOTE — ED Notes (Signed)
Pt given PM snack at this time.  

## 2023-08-08 NOTE — ED Notes (Signed)
Pt given snack before bedtime, vanilla & chocolate ice cream, along with frosted flakes cereal. Pt grateful

## 2023-08-08 NOTE — ED Provider Notes (Signed)
-----------------------------------------   5:14 PM on 08/08/2023 -----------------------------------------   Blood pressure (!) 133/51, pulse 65, temperature 98.1 F (36.7 C), resp. rate 17, height 5\' 2"  (1.575 m), weight 59 kg, SpO2 100%.  The patient is calm and cooperative at this time.  There have been no acute events since the last update.  Awaiting disposition plan from [facility] team.   Janith Lima, MD 08/08/23 1714

## 2023-08-08 NOTE — ED Notes (Signed)
Patient in bed resting no c/o pain. Took po meds and sleep sound throughout the night

## 2023-08-08 NOTE — ED Provider Notes (Signed)
-----------------------------------------   5:51 AM on 08/08/2023 -----------------------------------------   Blood pressure 136/61, pulse 63, temperature (!) 97.5 F (36.4 C), temperature source Oral, resp. rate 18, height 1.575 m (5\' 2" ), weight 59 kg, SpO2 99%.  The patient is calm and cooperative at this time.  There have been no acute events since the last update.  Awaiting disposition plan from [facility] team.   Loleta Rose, MD 08/08/23 [phone]

## 2023-08-08 NOTE — Progress Notes (Signed)
Mobility Specialist - Progress Note     08/08/23 1019  Mobility  Activity Ambulated with assistance in hallway;Stood at bedside  Level of Assistance Standby assist, set-up cues, supervision of patient - no hands on  Assistive Device Front wheel walker  Distance Ambulated (ft) 320 ft  Range of Motion/Exercises Active  Activity Response Tolerated well  Mobility Referral Yes  $Mobility charge 1 Mobility  Mobility Specialist Start Time (ACUTE ONLY) 1010  Mobility Specialist Stop Time (ACUTE ONLY) 1027  Mobility Specialist Time Calculation (min) (ACUTE ONLY) 17 min   Pt resting in recliner sleeping on RA upon entry. Pt STS and ambulates to hallway around NS SBA with RW. Pt returned to recliner and left with needs in reach. Pt endorses no pain, lightheadedness or dizziness during ambulation.   Johnathan Hausen Mobility Specialist 08/08/23, 10:32 AM

## 2023-08-08 NOTE — ED Notes (Signed)
Pt assisted to the BR and then tucked into bed, pt wanted to stay in her personal clothing as the gowns are "cold", pt given two new warm blankets, and covers placed over pt for comfort, lights on dim and door partially closed, pt has call bell within reach, side rails up x2 for safety, NAD noted, no further concerns as of present.

## 2023-08-09 ENCOUNTER — Encounter: Payer: Medicare Other | Admitting: Family

## 2023-08-09 DIAGNOSIS — S0011XA Contusion of right eyelid and periocular area, initial encounter: Secondary | ICD-10-CM | POA: Diagnosis not present

## 2023-08-09 DIAGNOSIS — S0012XA Contusion of left eyelid and periocular area, initial encounter: Secondary | ICD-10-CM | POA: Diagnosis not present

## 2023-08-09 DIAGNOSIS — R296 Repeated falls: Secondary | ICD-10-CM | POA: Diagnosis not present

## 2023-08-09 DIAGNOSIS — I6782 Cerebral ischemia: Secondary | ICD-10-CM | POA: Diagnosis not present

## 2023-08-09 DIAGNOSIS — E785 Hyperlipidemia, unspecified: Secondary | ICD-10-CM | POA: Diagnosis not present

## 2023-08-09 DIAGNOSIS — S022XXA Fracture of nasal bones, initial encounter for closed fracture: Secondary | ICD-10-CM | POA: Diagnosis not present

## 2023-08-09 DIAGNOSIS — I480 Paroxysmal atrial fibrillation: Secondary | ICD-10-CM | POA: Diagnosis not present

## 2023-08-09 DIAGNOSIS — E119 Type 2 diabetes mellitus without complications: Secondary | ICD-10-CM | POA: Diagnosis not present

## 2023-08-09 DIAGNOSIS — R2681 Unsteadiness on feet: Secondary | ICD-10-CM | POA: Diagnosis not present

## 2023-08-09 DIAGNOSIS — M6281 Muscle weakness (generalized): Secondary | ICD-10-CM | POA: Diagnosis not present

## 2023-08-09 DIAGNOSIS — I10 Essential (primary) hypertension: Secondary | ICD-10-CM | POA: Diagnosis not present

## 2023-08-09 DIAGNOSIS — M47812 Spondylosis without myelopathy or radiculopathy, cervical region: Secondary | ICD-10-CM | POA: Diagnosis not present

## 2023-08-09 DIAGNOSIS — S0083XA Contusion of other part of head, initial encounter: Secondary | ICD-10-CM | POA: Diagnosis not present

## 2023-08-09 DIAGNOSIS — M5030 Other cervical disc degeneration, unspecified cervical region: Secondary | ICD-10-CM | POA: Diagnosis not present

## 2023-08-09 LAB — CBG MONITORING, ED
Glucose-Capillary: 89 mg/dL (ref 70–99)
Glucose-Capillary: 102 mg/dL — ABNORMAL HIGH (ref 70–99)
Glucose-Capillary: 163 mg/dL — ABNORMAL HIGH (ref 70–99)

## 2023-08-09 NOTE — ED Notes (Signed)
Patient set up with lunch tray. Reports she is not hungry at this time

## 2023-08-09 NOTE — Progress Notes (Signed)
Occupational Therapy Treatment Patient Details Name: [name] MRN: [id number] DOB: January 24, 1929 Today's Date: 08/09/2023   History of present illness Pt is a 89 y/o F who presented to the hospital after a fall & suffering facial trauma. Pt's family reports pt's cognition has be declining over the past few months & pt has had multiple falls.  CT shows minimally displaced B nasal bone fxs. PMH: HTN, HLD, Lewy body dementia, DM2, paroxysmal a-fib   OT comments  Pt is seated in recliner on arrival. Pleasant and agreeable to OT session. She denies pain. Pt performed STS from recliner with SUP and ambulated throughout ED using RW with SBA ~360 feet with cueing for obstacle manuevering as pt would bump items in hallway d/t vision deficits. Pt declined all ADLs during today's session stating "I'm not doing a wash up, I need to go home and take a shower. My son is going to take me home or I'm checking myself out of here!" Pt returned to recliner with all needs in place and will cont to require skilled acute OT services to maximize her safety and IND to return to PLOF.       If plan is discharge home, recommend the following:  A little help with walking and/or transfers;A little help with bathing/dressing/bathroom;Assistance with cooking/housework;Assist for transportation;Help with stairs or ramp for entrance;Supervision due to cognitive status   Equipment Recommendations  Other (comment) (defer)    Recommendations for Other Services      Precautions / Restrictions Precautions Precautions: Fall Restrictions Weight Bearing Restrictions: No       Mobility Bed Mobility               General bed mobility comments: not assessed as patient sitting up in recliner on arrival and post session    Transfers Overall transfer level: Needs assistance Equipment used: Rolling walker (2 wheels) Transfers: Sit to/from Stand Sit to Stand: Supervision           General transfer  comment: SUP for STS and SBA for mobility around ED/2 nursing stations     Balance Overall balance assessment: Needs assistance Sitting-balance support: Feet supported, No upper extremity supported Sitting balance-Leahy Scale: Good     Standing balance support: During functional activity, Reliant on assistive device for balance Standing balance-Leahy Scale: Good Standing balance comment: able to bend over and flip incontinence pad over on her recliner with no UE support and no LOB noted; used RW for mobility in ED                           ADL either performed or assessed with clinical judgement   ADL                                         General ADL Comments: declined performance of any ADLs during today's session, but agreeable to mobility; reports she will not do a washup, prefers to take a shower    Extremity/Trunk Assessment         Cervical / Trunk Assessment Cervical / Trunk Assessment: Kyphotic    Vision       Perception     Praxis      Cognition Arousal: Alert Behavior During Therapy: WFL for tasks assessed/performed Overall Cognitive Status: History of cognitive impairments - at baseline  Exercises      Shoulder Instructions       General Comments      Pertinent Vitals/ Pain       Pain Assessment Pain Assessment: No/denies pain  Home Living                                          Prior Functioning/Environment              Frequency  Min 1X/week        Progress Toward Goals  OT Goals(current goals can now be found in the care plan section)  Progress towards OT goals: Progressing toward goals  Acute Rehab OT Goals Patient Stated Goal: return home to take a shower OT Goal Formulation: With patient Time For Goal Achievement: 08/18/23 Potential to Achieve Goals: Good  Plan      Co-evaluation                 AM-PAC  OT "6 Clicks" Daily Activity     Outcome Measure   Help from another person eating meals?: A Little Help from another person taking care of personal grooming?: A Little Help from another person toileting, which includes using toliet, bedpan, or urinal?: A Little Help from another person bathing (including washing, rinsing, drying)?: A Little Help from another person to put on and taking off regular upper body clothing?: A Little Help from another person to put on and taking off regular lower body clothing?: A Little 6 Click Score: 18    End of Session Equipment Utilized During Treatment: Rolling walker (2 wheels);Gait belt  OT Visit Diagnosis: Unsteadiness on feet (R26.81);Other abnormalities of gait and mobility (R26.89);Muscle weakness (generalized) (M62.81);History of falling (Z91.81)   Activity Tolerance     Patient Left with call bell/phone within reach;in chair   Nurse Communication Mobility status        Time: 2440-1027 OT Time Calculation (min): 11 min  Charges: OT General Charges $OT Visit: 1 Visit OT Treatments $Therapeutic Activity: 8-22 mins  Jajaira Ruis, OTR/L  08/09/23, 2:44 PM   Sailor Haughn E Kasi Lasky 08/09/2023, 2:42 PM

## 2023-08-09 NOTE — ED Provider Notes (Signed)
Emergency Medicine Observation Re-evaluation Note  [name] is a 89 y.o. female, seen on rounds today.  Pt initially presented to the ED for complaints of Fall and Facial Injury  Currently, the patient is resting in bed. No reported issues from nursing team.   Physical Exam  BP (!) 183/60 (BP Location: Right Arm)   Pulse 71   Temp 97.8 F (36.6 C) (Oral)   Resp 19   Ht 5\' 2"  (1.575 m)   Wt 59 kg   SpO2 99%   BMI 23.79 kg/m  Physical Exam General: Resting in bed  ED Course / MDM   BGL 79-233 last 24 hours  Plan  Current plan is for dispo per social work.    Trinna Post, MD 08/09/23 [phone]

## 2023-08-09 NOTE — ED Notes (Signed)
Patient eating breakfast tray at this time. 

## 2023-08-09 NOTE — ED Notes (Signed)
Patient set up with dinner tray and eating at this time

## 2023-08-09 NOTE — ED Notes (Signed)
Pt appears to be comfortable and resting, can observe even RR that are unlabored, side rails up x2 for safety, NAD noted, call light within reach, no further concerns as of present.

## 2023-08-10 DIAGNOSIS — E785 Hyperlipidemia, unspecified: Secondary | ICD-10-CM | POA: Diagnosis not present

## 2023-08-10 DIAGNOSIS — R2681 Unsteadiness on feet: Secondary | ICD-10-CM | POA: Diagnosis not present

## 2023-08-10 DIAGNOSIS — I10 Essential (primary) hypertension: Secondary | ICD-10-CM | POA: Diagnosis not present

## 2023-08-10 DIAGNOSIS — M6281 Muscle weakness (generalized): Secondary | ICD-10-CM | POA: Diagnosis not present

## 2023-08-10 DIAGNOSIS — S0083XA Contusion of other part of head, initial encounter: Secondary | ICD-10-CM | POA: Diagnosis not present

## 2023-08-10 DIAGNOSIS — E119 Type 2 diabetes mellitus without complications: Secondary | ICD-10-CM | POA: Diagnosis not present

## 2023-08-10 DIAGNOSIS — R296 Repeated falls: Secondary | ICD-10-CM | POA: Diagnosis not present

## 2023-08-10 DIAGNOSIS — M5030 Other cervical disc degeneration, unspecified cervical region: Secondary | ICD-10-CM | POA: Diagnosis not present

## 2023-08-10 DIAGNOSIS — I480 Paroxysmal atrial fibrillation: Secondary | ICD-10-CM | POA: Diagnosis not present

## 2023-08-10 DIAGNOSIS — S022XXA Fracture of nasal bones, initial encounter for closed fracture: Secondary | ICD-10-CM | POA: Diagnosis not present

## 2023-08-10 DIAGNOSIS — S0012XA Contusion of left eyelid and periocular area, initial encounter: Secondary | ICD-10-CM | POA: Diagnosis not present

## 2023-08-10 DIAGNOSIS — S0011XA Contusion of right eyelid and periocular area, initial encounter: Secondary | ICD-10-CM | POA: Diagnosis not present

## 2023-08-10 DIAGNOSIS — I6782 Cerebral ischemia: Secondary | ICD-10-CM | POA: Diagnosis not present

## 2023-08-10 DIAGNOSIS — M47812 Spondylosis without myelopathy or radiculopathy, cervical region: Secondary | ICD-10-CM | POA: Diagnosis not present

## 2023-08-10 LAB — CBG MONITORING, ED
Glucose-Capillary: 135 mg/dL — ABNORMAL HIGH (ref 70–99)
Glucose-Capillary: 126 mg/dL — ABNORMAL HIGH (ref 70–99)
Glucose-Capillary: 197 mg/dL — ABNORMAL HIGH (ref 70–99)
Glucose-Capillary: 116 mg/dL — ABNORMAL HIGH (ref 70–99)

## 2023-08-10 MED ORDER — QUETIAPINE FUMARATE 25 MG PO TABS
25.0000 mg | ORAL_TABLET | Freq: Once | ORAL | Status: AC
Start: 2023-08-10 — End: 2023-08-10
  Administered 2023-08-10: 25 mg via ORAL
  Filled 2023-08-10: qty 1

## 2023-08-10 MED ORDER — HALOPERIDOL LACTATE 5 MG/ML IJ SOLN
5.0000 mg | Freq: Once | INTRAMUSCULAR | Status: AC
Start: 2023-08-10 — End: 2023-08-10
  Administered 2023-08-10: 5 mg via INTRAMUSCULAR
  Filled 2023-08-10: qty 1

## 2023-08-10 NOTE — ED Notes (Signed)
TOC

## 2023-08-10 NOTE — ED Notes (Signed)
Pt assisted with dinner tray set up.

## 2023-08-10 NOTE — ED Notes (Signed)
Pt daughter visiting. Pt getting agitated, out of room stating she is leaving. Daughter's presence not assisting with behavior. Daughter left at this time. Pt walked around the department. Sitting in hallway after multiple attempts at redirection, warm blanket provided. Denies further needs.  Pt's daughter reports that she administered dupilumab while she was here visiting. Will notify pharmacy

## 2023-08-10 NOTE — Progress Notes (Signed)
Physical Therapy Treatment Patient Details Name: [name] MRN: [id number] DOB: [birth year] Today's Date: 08/10/2023   History of Present Illness Pt is a 89 y/o F who presented to the hospital after a fall & suffering facial trauma. Pt's family reports pt's cognition has be declining over the past few months & pt has had multiple falls.  CT shows minimally displaced B nasal bone fxs. PMH: HTN, HLD, Lewy body dementia, DM2, paroxysmal a-fib    PT Comments  Patient received in straight back chair seated at doorway to room dressed in street clothes. She reports she is getting ready to go home. Has to take a shower and go to hair appointment. Patient requires some encouragement to participate. She is able to stand independently and ambulated 300 feet with RW, independently. No lob or difficulty noted. Patient does not require continued skilled PT while here in hospital. Signing off.      If plan is discharge home, recommend the following:  N/a   Can travel by private vehicle     Yes  Equipment Recommendations  None recommended by PT    Recommendations for Other Services  N/a     Precautions / Restrictions Restrictions Weight Bearing Restrictions: No     Mobility  Bed Mobility               General bed mobility comments: NT, patient seated in doorway in straight back chair.    Transfers Overall transfer level: Independent Equipment used: Rolling walker (2 wheels)   Sit to Stand: Independent                Ambulation/Gait     Assistive device: Rolling walker (2 wheels) Gait Pattern/deviations: Step-through pattern Gait velocity: WNL     General Gait Details: quick pace, no LOB. Ambulated 300 feet supervision.   Stairs             Wheelchair Mobility     Tilt Bed    Modified Rankin (Stroke Patients Only)       Balance Overall balance assessment: Modified Independent Sitting-balance support: Feet supported Sitting balance-Leahy  Scale: Normal     Standing balance support: Bilateral upper extremity supported, During functional activity, Reliant on assistive device for balance Standing balance-Leahy Scale: Good                              Cognition Arousal: Alert Behavior During Therapy: WFL for tasks assessed/performed Overall Cognitive Status: History of cognitive impairments - at baseline                                 General Comments: Pleasant, confused        Exercises      General Comments        Pertinent Vitals/Pain Pain Assessment Pain Assessment: No/denies pain    Home Living                          Prior Function            PT Goals (current goals can now be found in the care plan section) Acute Rehab PT Goals Patient Stated Goal: go home and get to hair appointment PT Goal Formulation: With patient Time For Goal Achievement: 08/17/23 Potential to Achieve Goals: Good Progress towards PT goals: Goals met/education completed, patient discharged from PT  Frequency           PT Plan      Co-evaluation              AM-PAC PT "6 Clicks" Mobility   Outcome Measure  Help needed turning from your back to your side while in a flat bed without using bedrails?: None Help needed moving from lying on your back to sitting on the side of a flat bed without using bedrails?: None Help needed moving to and from a bed to a chair (including a wheelchair)?: None Help needed standing up from a chair using your arms (e.g., wheelchair or bedside chair)?: None Help needed to walk in hospital room?: None Help needed climbing 3-5 steps with a railing? : A Little 6 Click Score: 23    End of Session   Activity Tolerance: Patient tolerated treatment well;No increased pain Patient left: in chair Nurse Communication: Mobility status       Time: 4098-1191 PT Time Calculation (min) (ACUTE ONLY): 10 min  Charges:    $Gait Training: 8-22  mins PT General Charges $$ ACUTE PT VISIT: 1 Visit                     Navdeep Fessenden, PT, GCS 08/10/23,11:24 AM

## 2023-08-10 NOTE — ED Notes (Signed)
Pt ambulatory to toilet with walker. Provided breakfast tray and assisted with set up

## 2023-08-10 NOTE — ED Provider Notes (Signed)
-----------------------------------------   4:48 PM on 08/10/2023 -----------------------------------------   Blood pressure (!) 157/61, pulse 70, temperature 97.9 F (36.6 C), resp. rate 18, height 5\' 2"  (1.575 m), weight 59 kg, SpO2 97%.  The patient is calm and cooperative at this time.  There have been no acute events since the last update.  Awaiting disposition plan from case management/social work.    Corena Herter, MD 08/10/23 [phone]

## 2023-08-10 NOTE — ED Notes (Signed)
Pharmacy contacted for semglee

## 2023-08-10 NOTE — ED Notes (Signed)
Pt out in hallway with bag and flowers, in attempt to walk out of exit. Attempted to redirect back to room. Pt yelling and crying, appears very agitated and frustrated. Given phone and son called, unable to console. Dr Vicente Males notified to request additional prn

## 2023-08-10 NOTE — ED Notes (Signed)
Dr Arnoldo Morale notified of family bringing and administering dupilumab. No new orders.

## 2023-08-10 NOTE — TOC Progression Note (Signed)
Transition of Care [facility]) - Progression Note    Patient Details  Name: [name] MRN: [id number] Date of Birth: [birth year]  [facility] of [facility]) CM/SW Contact  Marquita Palms, LCSW Phone Number: 08/10/2023, 3:26 PM  Clinical Narrative:     CSW received phone call from patient son concerning placement. He reports that he was told by the DSS worker that he would have to already have a bed offer for the special assistance. CSW explained that phone calls could be made to previous places on the list but unsure of outcome so late in the day. CSW called [facility], awaiting call back. CSW called [employer] with awaiting response  Expected Discharge Plan: Skilled Nursing Facility Barriers to Discharge: Continued Medical Work up  Expected Discharge Plan and Services       Living arrangements for the past 2 months: Single Family Home                                       Social Determinants of Health (SDOH) Interventions SDOH Screenings   Food Insecurity: No Food Insecurity (05/19/2023)   Received from [facility]  Housing: Low Risk  (11/08/2022)  Transportation Needs: No Transportation Needs (05/19/2023)   Received from [facility]  Utilities: Not At Risk (05/19/2023)   Received from [facility]  Alcohol Screen: Low Risk  (05/20/2022)  Depression (PHQ2-9): Low Risk  (06/21/2022)  Financial Resource Strain: Low Risk  (05/19/2023)   Received from [facility]  Physical Activity: Inactive (05/19/2023)   Received from [facility]  Social Connections: Moderately Isolated (05/20/2022)  Stress: No Stress Concern Present (05/20/2022)  Tobacco Use: Low Risk  (07/23/2023)    Readmission Risk Interventions    08/02/2022    1:07 PM  Readmission Risk Prevention Plan  Transportation Screening Complete  Medication Review (RN Care Manager) Complete  PCP or Specialist  appointment within 3-5 days of discharge Complete  HRI or Home Care Consult Complete  SW Recovery Care/Counseling Consult Complete  Palliative Care Screening Not Applicable  Skilled Nursing Facility Not Applicable

## 2023-08-10 NOTE — ED Notes (Signed)
Pt provided lunch tray, states does not feel like eating and she is supposed to be going home. Lunch tray left at bedside in case pt changes mind.

## 2023-08-10 NOTE — TOC Progression Note (Signed)
Transition of Care [facility]) - Progression Note    Patient Details  Name: [name] MRN: [id number] Date of Birth: 16-Dec-1928  Transition of Care [facility]) CM/SW Contact  Marquita Palms, LCSW Phone Number: 08/10/2023, 2:35 PM  Clinical Narrative:     CSW spoke with patients son via telephone to discuss placement for patient. He reported that he has completed that medicaid forms with Gina P. as a contact. He reported that he would like for his mother to go to [employer] or [employer]. Awaitng approval of medicaid application for placement.  Expected Discharge Plan: Skilled Nursing Facility Barriers to Discharge: Continued Medical Work up  Expected Discharge Plan and Services       Living arrangements for the past 2 months: Single Family Home                                       Social Determinants of Health (SDOH) Interventions SDOH Screenings   Food Insecurity: No Food Insecurity (05/19/2023)   Received from [facility]  Housing: Low Risk  (11/08/2022)  Transportation Needs: No Transportation Needs (05/19/2023)   Received from [facility]  Utilities: Not At Risk (05/19/2023)   Received from [facility]  Alcohol Screen: Low Risk  (05/20/2022)  Depression (PHQ2-9): Low Risk  (06/21/2022)  Financial Resource Strain: Low Risk  (05/19/2023)   Received from [facility]  Physical Activity: Inactive (05/19/2023)   Received from [facility]  Social Connections: Moderately Isolated (05/20/2022)  Stress: No Stress Concern Present (05/20/2022)  Tobacco Use: Low Risk  (07/23/2023)    Readmission Risk Interventions    08/02/2022    1:07 PM  Readmission Risk Prevention Plan  Transportation Screening Complete  Medication Review (RN Care Manager) Complete  PCP or Specialist appointment within 3-5 days of discharge Complete  HRI or Home Care Consult Complete  SW Recovery  Care/Counseling Consult Complete  Palliative Care Screening Not Applicable  Skilled Nursing Facility Not Applicable

## 2023-08-10 NOTE — ED Notes (Signed)
Pt continuously walking around room and out into hallway states is looking for daughter to go home. Refusing to eat lunch, states is eating lunch at home. Unable to redirect, getting agitated waiting on daughter.  Dr Vicente Males notified to request prn

## 2023-08-10 NOTE — ED Notes (Signed)
Unable to console pt with phone call to family

## 2023-08-11 DIAGNOSIS — E785 Hyperlipidemia, unspecified: Secondary | ICD-10-CM | POA: Diagnosis not present

## 2023-08-11 DIAGNOSIS — M47812 Spondylosis without myelopathy or radiculopathy, cervical region: Secondary | ICD-10-CM | POA: Diagnosis not present

## 2023-08-11 DIAGNOSIS — R2681 Unsteadiness on feet: Secondary | ICD-10-CM | POA: Diagnosis not present

## 2023-08-11 DIAGNOSIS — R296 Repeated falls: Secondary | ICD-10-CM | POA: Diagnosis not present

## 2023-08-11 DIAGNOSIS — I480 Paroxysmal atrial fibrillation: Secondary | ICD-10-CM | POA: Diagnosis not present

## 2023-08-11 DIAGNOSIS — I10 Essential (primary) hypertension: Secondary | ICD-10-CM | POA: Diagnosis not present

## 2023-08-11 DIAGNOSIS — E119 Type 2 diabetes mellitus without complications: Secondary | ICD-10-CM | POA: Diagnosis not present

## 2023-08-11 DIAGNOSIS — S0012XA Contusion of left eyelid and periocular area, initial encounter: Secondary | ICD-10-CM | POA: Diagnosis not present

## 2023-08-11 DIAGNOSIS — I6782 Cerebral ischemia: Secondary | ICD-10-CM | POA: Diagnosis not present

## 2023-08-11 DIAGNOSIS — S0083XA Contusion of other part of head, initial encounter: Secondary | ICD-10-CM | POA: Diagnosis not present

## 2023-08-11 DIAGNOSIS — S0011XA Contusion of right eyelid and periocular area, initial encounter: Secondary | ICD-10-CM | POA: Diagnosis not present

## 2023-08-11 DIAGNOSIS — M6281 Muscle weakness (generalized): Secondary | ICD-10-CM | POA: Diagnosis not present

## 2023-08-11 DIAGNOSIS — M5030 Other cervical disc degeneration, unspecified cervical region: Secondary | ICD-10-CM | POA: Diagnosis not present

## 2023-08-11 DIAGNOSIS — S022XXA Fracture of nasal bones, initial encounter for closed fracture: Secondary | ICD-10-CM | POA: Diagnosis not present

## 2023-08-11 LAB — CBG MONITORING, ED
Glucose-Capillary: 84 mg/dL (ref 70–99)
Glucose-Capillary: 193 mg/dL — ABNORMAL HIGH (ref 70–99)
Glucose-Capillary: 152 mg/dL — ABNORMAL HIGH (ref 70–99)
Glucose-Capillary: 117 mg/dL — ABNORMAL HIGH (ref 70–99)

## 2023-08-11 NOTE — ED Provider Notes (Signed)
-----------------------------------------   10:30 PM on 08/11/2023 -----------------------------------------   Blood pressure (!) 190/84, pulse 74, temperature 97.8 F (36.6 C), temperature source Oral, resp. rate 15, height 5\' 2"  (1.575 m), weight 59 kg, SpO2 96%.  The patient is calm and cooperative at this time.  There have been no acute events since the last update.  Awaiting disposition plan from social work.    Dionne Bucy, MD 08/11/23 [phone]

## 2023-08-11 NOTE — ED Notes (Signed)
BG POC = 117

## 2023-08-12 DIAGNOSIS — I6782 Cerebral ischemia: Secondary | ICD-10-CM | POA: Diagnosis not present

## 2023-08-12 DIAGNOSIS — R296 Repeated falls: Secondary | ICD-10-CM | POA: Diagnosis not present

## 2023-08-12 DIAGNOSIS — S0083XA Contusion of other part of head, initial encounter: Secondary | ICD-10-CM | POA: Diagnosis not present

## 2023-08-12 DIAGNOSIS — R2681 Unsteadiness on feet: Secondary | ICD-10-CM | POA: Diagnosis not present

## 2023-08-12 DIAGNOSIS — E119 Type 2 diabetes mellitus without complications: Secondary | ICD-10-CM | POA: Diagnosis not present

## 2023-08-12 DIAGNOSIS — M6281 Muscle weakness (generalized): Secondary | ICD-10-CM | POA: Diagnosis not present

## 2023-08-12 DIAGNOSIS — E785 Hyperlipidemia, unspecified: Secondary | ICD-10-CM | POA: Diagnosis not present

## 2023-08-12 DIAGNOSIS — S022XXA Fracture of nasal bones, initial encounter for closed fracture: Secondary | ICD-10-CM | POA: Diagnosis not present

## 2023-08-12 DIAGNOSIS — M5030 Other cervical disc degeneration, unspecified cervical region: Secondary | ICD-10-CM | POA: Diagnosis not present

## 2023-08-12 DIAGNOSIS — I10 Essential (primary) hypertension: Secondary | ICD-10-CM | POA: Diagnosis not present

## 2023-08-12 DIAGNOSIS — S0011XA Contusion of right eyelid and periocular area, initial encounter: Secondary | ICD-10-CM | POA: Diagnosis not present

## 2023-08-12 DIAGNOSIS — M47812 Spondylosis without myelopathy or radiculopathy, cervical region: Secondary | ICD-10-CM | POA: Diagnosis not present

## 2023-08-12 DIAGNOSIS — I480 Paroxysmal atrial fibrillation: Secondary | ICD-10-CM | POA: Diagnosis not present

## 2023-08-12 DIAGNOSIS — S0012XA Contusion of left eyelid and periocular area, initial encounter: Secondary | ICD-10-CM | POA: Diagnosis not present

## 2023-08-12 LAB — CBG MONITORING, ED
Glucose-Capillary: 94 mg/dL (ref 70–99)
Glucose-Capillary: 113 mg/dL — ABNORMAL HIGH (ref 70–99)
Glucose-Capillary: 240 mg/dL — ABNORMAL HIGH (ref 70–99)
Glucose-Capillary: 155 mg/dL — ABNORMAL HIGH (ref 70–99)

## 2023-08-12 NOTE — ED Notes (Signed)
Lunch tray provided. 

## 2023-08-12 NOTE — ED Provider Notes (Signed)
Emergency Medicine Observation Re-evaluation Note  Physical Exam   BP (!) 190/84   Pulse 74   Temp 97.8 F (36.6 C) (Oral)   Resp 15   Ht 5\' 2"  (1.575 m)   Wt 59 kg   SpO2 96%   BMI 23.79 kg/m   Patient appears in no acute distress.  ED Course / MDM   No reported events during my shift at the time of this note.   Pt is awaiting dispo from consultants   Pilar Jarvis MD    Pilar Jarvis, MD 08/12/23 [phone]

## 2023-08-12 NOTE — ED Notes (Signed)
Pt was taken to shower and pt gave self a shower with minimal assistance, pt placed clean clothes on.

## 2023-08-12 NOTE — ED Notes (Signed)
Pt got upset that she wasn't leaving today with her family. Pt attempted to use her walker and leave. After a few minutes of talking to the patient, she was redirected to her room with ease.

## 2023-08-13 DIAGNOSIS — I10 Essential (primary) hypertension: Secondary | ICD-10-CM | POA: Diagnosis not present

## 2023-08-13 DIAGNOSIS — R296 Repeated falls: Secondary | ICD-10-CM | POA: Diagnosis not present

## 2023-08-13 DIAGNOSIS — M6281 Muscle weakness (generalized): Secondary | ICD-10-CM | POA: Diagnosis not present

## 2023-08-13 DIAGNOSIS — S0012XA Contusion of left eyelid and periocular area, initial encounter: Secondary | ICD-10-CM | POA: Diagnosis not present

## 2023-08-13 DIAGNOSIS — M5030 Other cervical disc degeneration, unspecified cervical region: Secondary | ICD-10-CM | POA: Diagnosis not present

## 2023-08-13 DIAGNOSIS — S022XXA Fracture of nasal bones, initial encounter for closed fracture: Secondary | ICD-10-CM | POA: Diagnosis not present

## 2023-08-13 DIAGNOSIS — S0083XA Contusion of other part of head, initial encounter: Secondary | ICD-10-CM | POA: Diagnosis not present

## 2023-08-13 DIAGNOSIS — M47812 Spondylosis without myelopathy or radiculopathy, cervical region: Secondary | ICD-10-CM | POA: Diagnosis not present

## 2023-08-13 DIAGNOSIS — E785 Hyperlipidemia, unspecified: Secondary | ICD-10-CM | POA: Diagnosis not present

## 2023-08-13 DIAGNOSIS — E119 Type 2 diabetes mellitus without complications: Secondary | ICD-10-CM | POA: Diagnosis not present

## 2023-08-13 DIAGNOSIS — S0011XA Contusion of right eyelid and periocular area, initial encounter: Secondary | ICD-10-CM | POA: Diagnosis not present

## 2023-08-13 DIAGNOSIS — I480 Paroxysmal atrial fibrillation: Secondary | ICD-10-CM | POA: Diagnosis not present

## 2023-08-13 DIAGNOSIS — I6782 Cerebral ischemia: Secondary | ICD-10-CM | POA: Diagnosis not present

## 2023-08-13 DIAGNOSIS — R2681 Unsteadiness on feet: Secondary | ICD-10-CM | POA: Diagnosis not present

## 2023-08-13 LAB — CBG MONITORING, ED
Glucose-Capillary: 94 mg/dL (ref 70–99)
Glucose-Capillary: 180 mg/dL — ABNORMAL HIGH (ref 70–99)
Glucose-Capillary: 163 mg/dL — ABNORMAL HIGH (ref 70–99)
Glucose-Capillary: 148 mg/dL — ABNORMAL HIGH (ref 70–99)

## 2023-08-13 NOTE — ED Notes (Signed)
Set up pt with dinner tray

## 2023-08-13 NOTE — ED Notes (Signed)
This tech assisted patient with shower.

## 2023-08-13 NOTE — ED Notes (Signed)
Pt ate 90% of meal tray.

## 2023-08-13 NOTE — ED Provider Notes (Signed)
-----------------------------------------   6:22 AM on 08/13/2023 -----------------------------------------   Blood pressure 135/64, pulse 71, temperature 98 F (36.7 C), temperature source Oral, resp. rate 18, height 5\' 2"  (1.575 m), weight 59 kg, SpO2 93%.  The patient is calm and cooperative at this time.  There have been no acute events since the last update.  Awaiting disposition plan from Social Work team.   Irean Hong, MD 08/13/23 [phone]

## 2023-08-14 DIAGNOSIS — M47812 Spondylosis without myelopathy or radiculopathy, cervical region: Secondary | ICD-10-CM | POA: Diagnosis not present

## 2023-08-14 DIAGNOSIS — S0083XA Contusion of other part of head, initial encounter: Secondary | ICD-10-CM | POA: Diagnosis not present

## 2023-08-14 DIAGNOSIS — I6782 Cerebral ischemia: Secondary | ICD-10-CM | POA: Diagnosis not present

## 2023-08-14 DIAGNOSIS — E119 Type 2 diabetes mellitus without complications: Secondary | ICD-10-CM | POA: Diagnosis not present

## 2023-08-14 DIAGNOSIS — R2681 Unsteadiness on feet: Secondary | ICD-10-CM | POA: Diagnosis not present

## 2023-08-14 DIAGNOSIS — M5030 Other cervical disc degeneration, unspecified cervical region: Secondary | ICD-10-CM | POA: Diagnosis not present

## 2023-08-14 DIAGNOSIS — M6281 Muscle weakness (generalized): Secondary | ICD-10-CM | POA: Diagnosis not present

## 2023-08-14 DIAGNOSIS — S0011XA Contusion of right eyelid and periocular area, initial encounter: Secondary | ICD-10-CM | POA: Diagnosis not present

## 2023-08-14 DIAGNOSIS — S0012XA Contusion of left eyelid and periocular area, initial encounter: Secondary | ICD-10-CM | POA: Diagnosis not present

## 2023-08-14 DIAGNOSIS — S022XXA Fracture of nasal bones, initial encounter for closed fracture: Secondary | ICD-10-CM | POA: Diagnosis not present

## 2023-08-14 DIAGNOSIS — E785 Hyperlipidemia, unspecified: Secondary | ICD-10-CM | POA: Diagnosis not present

## 2023-08-14 DIAGNOSIS — I480 Paroxysmal atrial fibrillation: Secondary | ICD-10-CM | POA: Diagnosis not present

## 2023-08-14 DIAGNOSIS — R296 Repeated falls: Secondary | ICD-10-CM | POA: Diagnosis not present

## 2023-08-14 DIAGNOSIS — I10 Essential (primary) hypertension: Secondary | ICD-10-CM | POA: Diagnosis not present

## 2023-08-14 LAB — CBG MONITORING, ED
Glucose-Capillary: 64 mg/dL — ABNORMAL LOW (ref 70–99)
Glucose-Capillary: 118 mg/dL — ABNORMAL HIGH (ref 70–99)
Glucose-Capillary: 157 mg/dL — ABNORMAL HIGH (ref 70–99)
Glucose-Capillary: 222 mg/dL — ABNORMAL HIGH (ref 70–99)
Glucose-Capillary: 201 mg/dL — ABNORMAL HIGH (ref 70–99)

## 2023-08-14 NOTE — ED Notes (Signed)
 Pt received snack and vitals taken at this time.

## 2023-08-14 NOTE — ED Notes (Signed)
Immediately given orange juice and crackers, while Pt was snacking, her breakfast tray arrived. Pt ambulated to bathroom without assistance or difficulty, will recheck BGL after breakfast

## 2023-08-14 NOTE — ED Provider Notes (Signed)
Emergency Medicine Observation Re-evaluation Note  Physical Exam   BP (!) 184/71   Pulse 73   Temp 97.7 F (36.5 C) (Oral)   Resp 14   Ht 5\' 2"  (1.575 m)   Wt 59 kg   SpO2 99%   BMI 23.79 kg/m   Patient appears in no acute distress.  ED Course / MDM   No reported events during my shift at the time of this note.   Pt is awaiting dispo from consultants   Pilar Jarvis MD    Pilar Jarvis, MD 08/14/23 [phone]

## 2023-08-14 NOTE — ED Notes (Signed)
RN to bedside to introduce self to pt. Pt alert and oriented to baseline.

## 2023-08-15 DIAGNOSIS — S0083XA Contusion of other part of head, initial encounter: Secondary | ICD-10-CM | POA: Diagnosis not present

## 2023-08-15 DIAGNOSIS — I10 Essential (primary) hypertension: Secondary | ICD-10-CM | POA: Diagnosis not present

## 2023-08-15 DIAGNOSIS — M5030 Other cervical disc degeneration, unspecified cervical region: Secondary | ICD-10-CM | POA: Diagnosis not present

## 2023-08-15 DIAGNOSIS — R2681 Unsteadiness on feet: Secondary | ICD-10-CM | POA: Diagnosis not present

## 2023-08-15 DIAGNOSIS — I6782 Cerebral ischemia: Secondary | ICD-10-CM | POA: Diagnosis not present

## 2023-08-15 DIAGNOSIS — E785 Hyperlipidemia, unspecified: Secondary | ICD-10-CM | POA: Diagnosis not present

## 2023-08-15 DIAGNOSIS — M6281 Muscle weakness (generalized): Secondary | ICD-10-CM | POA: Diagnosis not present

## 2023-08-15 DIAGNOSIS — S0012XA Contusion of left eyelid and periocular area, initial encounter: Secondary | ICD-10-CM | POA: Diagnosis not present

## 2023-08-15 DIAGNOSIS — E119 Type 2 diabetes mellitus without complications: Secondary | ICD-10-CM | POA: Diagnosis not present

## 2023-08-15 DIAGNOSIS — I480 Paroxysmal atrial fibrillation: Secondary | ICD-10-CM | POA: Diagnosis not present

## 2023-08-15 DIAGNOSIS — R296 Repeated falls: Secondary | ICD-10-CM | POA: Diagnosis not present

## 2023-08-15 DIAGNOSIS — S0011XA Contusion of right eyelid and periocular area, initial encounter: Secondary | ICD-10-CM | POA: Diagnosis not present

## 2023-08-15 DIAGNOSIS — S022XXA Fracture of nasal bones, initial encounter for closed fracture: Secondary | ICD-10-CM | POA: Diagnosis not present

## 2023-08-15 DIAGNOSIS — M47812 Spondylosis without myelopathy or radiculopathy, cervical region: Secondary | ICD-10-CM | POA: Diagnosis not present

## 2023-08-15 LAB — CBG MONITORING, ED
Glucose-Capillary: 60 mg/dL — ABNORMAL LOW (ref 70–99)
Glucose-Capillary: 133 mg/dL — ABNORMAL HIGH (ref 70–99)
Glucose-Capillary: 152 mg/dL — ABNORMAL HIGH (ref 70–99)
Glucose-Capillary: 214 mg/dL — ABNORMAL HIGH (ref 70–99)

## 2023-08-15 MED ORDER — VITAMIN E 180 MG (400 UNIT) PO CAPS
800.0000 [IU] | ORAL_CAPSULE | Freq: Every day | ORAL | Status: DC
  Administered 2023-08-15 – 2023-08-23 (×9): 800 [IU] via ORAL
  Filled 2023-08-15 (×9): qty 2

## 2023-08-15 NOTE — Inpatient Diabetes Management (Signed)
Inpatient Diabetes Program Recommendations  AACE/ADA: New Consensus Statement on Inpatient Glycemic Control (2015)  Target Ranges:  Prepandial:   less than 140 mg/dL      Peak postprandial:   less than 180 mg/dL (1-2 hours)      Critically ill patients:  140 - 180 mg/dL   Lab Results  Component Value Date   GLUCAP 60 (L) 08/15/2023   HGBA1C 8.4 (H) 07/23/2023    Review of Glycemic Control  Latest Reference Range & Units 08/14/23 13:11 08/14/23 16:49 08/14/23 22:23 08/15/23 07:23  Glucose-Capillary 70 - 99 mg/dL 098 (H) 119 (H) 147 (H) 60 (L)  (H): Data is abnormally high (L): Data is abnormally low Diabetes history: DM 2 Outpatient Diabetes medications: Humalog 12 units breakfast, 15 units lunch, 15 units dinner, Lantus 12 units qhs Current orders for Inpatient glycemic control:  Semglee 10 units Daily Novolog 0-9 units tid   A1c  8.4%   Inpatient Diabetes Program Recommendations:    Noted hypoglycemia for FSBG x 2 days. Consider further reducing Semglee to 8 units every day.  Thanks, Lujean Rave, MSN, RNC-OB Diabetes Coordinator [phone] (8a-5p)

## 2023-08-15 NOTE — ED Provider Notes (Signed)
-----------------------------------------   6:26 AM on 08/15/2023 -----------------------------------------   Blood pressure (!) 173/60, pulse 77, temperature 98.5 F (36.9 C), temperature source Oral, resp. rate 18, height 5\' 2"  (1.575 m), weight 59 kg, SpO2 98%.  The patient is calm and cooperative at this time.  There have been no acute events since the last update.  Awaiting disposition plan from Social Work team.   Irean Hong, MD 08/15/23 [phone]

## 2023-08-15 NOTE — ED Notes (Signed)
Pt is sitting in her chair quietly. No distress observed.

## 2023-08-15 NOTE — Progress Notes (Signed)
Occupational Therapy Treatment Patient Details Name: [name] MRN: [id number] DOB: [birth year] Today's Date: 08/15/2023   History of present illness Pt is a 89 y/o F who presented to the hospital after a fall & suffering facial trauma. Pt's family reports pt's cognition has be declining over the past few months & pt has had multiple falls.  CT shows minimally displaced B nasal bone fxs. PMH: HTN, HLD, Lewy body dementia, DM2, paroxysmal a-fib   OT comments  Pt is seated in chair with no arms at entrance to her room on arrival. Pleasant and agreeable to OT session. She denies pain. Pt performed STS with IND and ambulated in room and ~100 feet in ED using RW with IND. She stood at sink to perform oral care and don deodorant in standing with MOD I. Pt does not appear to need further acute OT services and likely none on DC. Would benefit from 24/7 supervision d/t cognitive status/safety. Pt returned to chair with all needs in place. OT goals met and signed off in house.      If plan is discharge home, recommend the following:  Direct supervision/assist for medications management;Assist for transportation;Help with stairs or ramp for entrance;Direct supervision/assist for financial management;Supervision due to cognitive status   Equipment Recommendations  None recommended by OT    Recommendations for Other Services      Precautions / Restrictions Restrictions Weight Bearing Restrictions: No       Mobility Bed Mobility                 Patient Response: Cooperative  Transfers Overall transfer level: Independent Equipment used: Rolling walker (2 wheels) Transfers: Sit to/from Stand             General transfer comment: IND for STS and mobility using RW ~100 feet     Balance Overall balance assessment: Modified Independent   Sitting balance-Leahy Scale: Normal     Standing balance support: Bilateral upper extremity supported, During functional activity,  Reliant on assistive device for balance Standing balance-Leahy Scale: Good                             ADL either performed or assessed with clinical judgement   ADL Overall ADL's : Modified independent                                       General ADL Comments: MOD I to perform oral care and don deodorant standing at sink with no UE support or unilateral support on sink    Extremity/Trunk Assessment Upper Extremity Assessment Upper Extremity Assessment: Overall WFL for tasks assessed   Lower Extremity Assessment Lower Extremity Assessment: Overall WFL for tasks assessed        Vision       Perception     Praxis      Cognition Arousal: Alert Behavior During Therapy: WFL for tasks assessed/performed Overall Cognitive Status: History of cognitive impairments - at baseline                                 General Comments: Pleasant, confused        Exercises      Shoulder Instructions       General Comments      Pertinent Vitals/ Pain  Pain Assessment Pain Assessment: No/denies pain Pain Intervention(s): Monitored during session  Home Living                                          Prior Functioning/Environment              Frequency           Progress Toward Goals  OT Goals(current goals can now be found in the care plan section)  Progress towards OT goals: Goals met/education completed, patient discharged from OT  Acute Rehab OT Goals Patient Stated Goal: return to her home or safe DC disposition OT Goal Formulation: With patient Time For Goal Achievement: 08/18/23 Potential to Achieve Goals: Good  Plan      Co-evaluation                 AM-PAC OT "6 Clicks" Daily Activity     Outcome Measure   Help from another person eating meals?: None Help from another person taking care of personal grooming?: None Help from another person toileting, which includes using  toliet, bedpan, or urinal?: None Help from another person bathing (including washing, rinsing, drying)?: None Help from another person to put on and taking off regular upper body clothing?: None Help from another person to put on and taking off regular lower body clothing?: None 6 Click Score: 24    End of Session Equipment Utilized During Treatment: Rolling walker (2 wheels)  OT Visit Diagnosis: History of falling (Z91.81)   Activity Tolerance Patient tolerated treatment well   Patient Left with call bell/phone within reach;in chair   Nurse Communication Mobility status        Time: 1610-9604 OT Time Calculation (min): 12 min  Charges: OT General Charges $OT Visit: 1 Visit OT Treatments $Self Care/Home Management : 8-22 mins  Aunika Kirsten, OTR/L  08/15/23, 2:00 PM   Mandy Fitzwater E Tyrianna Lightle 08/15/2023, 1:58 PM

## 2023-08-16 DIAGNOSIS — S022XXA Fracture of nasal bones, initial encounter for closed fracture: Secondary | ICD-10-CM | POA: Diagnosis not present

## 2023-08-16 DIAGNOSIS — E119 Type 2 diabetes mellitus without complications: Secondary | ICD-10-CM | POA: Diagnosis not present

## 2023-08-16 DIAGNOSIS — M5030 Other cervical disc degeneration, unspecified cervical region: Secondary | ICD-10-CM | POA: Diagnosis not present

## 2023-08-16 DIAGNOSIS — S0012XA Contusion of left eyelid and periocular area, initial encounter: Secondary | ICD-10-CM | POA: Diagnosis not present

## 2023-08-16 DIAGNOSIS — M47812 Spondylosis without myelopathy or radiculopathy, cervical region: Secondary | ICD-10-CM | POA: Diagnosis not present

## 2023-08-16 DIAGNOSIS — S0083XA Contusion of other part of head, initial encounter: Secondary | ICD-10-CM | POA: Diagnosis not present

## 2023-08-16 DIAGNOSIS — S0011XA Contusion of right eyelid and periocular area, initial encounter: Secondary | ICD-10-CM | POA: Diagnosis not present

## 2023-08-16 DIAGNOSIS — R2681 Unsteadiness on feet: Secondary | ICD-10-CM | POA: Diagnosis not present

## 2023-08-16 DIAGNOSIS — M6281 Muscle weakness (generalized): Secondary | ICD-10-CM | POA: Diagnosis not present

## 2023-08-16 DIAGNOSIS — I10 Essential (primary) hypertension: Secondary | ICD-10-CM | POA: Diagnosis not present

## 2023-08-16 DIAGNOSIS — I480 Paroxysmal atrial fibrillation: Secondary | ICD-10-CM | POA: Diagnosis not present

## 2023-08-16 DIAGNOSIS — I6782 Cerebral ischemia: Secondary | ICD-10-CM | POA: Diagnosis not present

## 2023-08-16 DIAGNOSIS — E785 Hyperlipidemia, unspecified: Secondary | ICD-10-CM | POA: Diagnosis not present

## 2023-08-16 DIAGNOSIS — R296 Repeated falls: Secondary | ICD-10-CM | POA: Diagnosis not present

## 2023-08-16 LAB — CBG MONITORING, ED
Glucose-Capillary: 99 mg/dL (ref 70–99)
Glucose-Capillary: 216 mg/dL — ABNORMAL HIGH (ref 70–99)
Glucose-Capillary: 100 mg/dL — ABNORMAL HIGH (ref 70–99)
Glucose-Capillary: 210 mg/dL — ABNORMAL HIGH (ref 70–99)

## 2023-08-16 NOTE — ED Provider Notes (Signed)
Emergency Medicine Observation Re-evaluation Note  [name] is a 89 y.o. female, currently boarding in the emergency department.  No acute events since last update.  Physical Exam  BP (!) 144/56 (BP Location: Right Arm)   Pulse 74   Temp 98 F (36.7 C) (Oral)   Resp 16   Ht 5\' 2"  (1.575 m)   Wt 59 kg   SpO2 92%   BMI 23.79 kg/m   ED Course / MDM   No recent lab work for review besides CBGs which appear relatively well-controlled.  Plan  Current plan is for placement to an appropriate living facility once available.  Social worker is currently working with the patient to achieve this.    Minna Antis, MD 08/16/23 2229

## 2023-08-16 NOTE — ED Notes (Signed)
Pt was observed getting back in bed. Pt denies any needs.

## 2023-08-16 NOTE — ED Provider Notes (Signed)
-----------------------------------------   7:12 AM on 08/16/2023 -----------------------------------------   Blood pressure (!) 124/51, pulse 66, temperature 97.8 F (36.6 C), temperature source Oral, resp. rate 16, height 5\' 2"  (1.575 m), weight 59 kg, SpO2 93%.  The patient is calm and cooperative at this time.  There have been no acute events since the last update.  Awaiting disposition plan from case management/social work.    Yaviel Kloster, Layla Maw, DO 08/16/23 [phone]

## 2023-08-17 ENCOUNTER — Encounter: Payer: Medicare Other | Admitting: Family

## 2023-08-17 DIAGNOSIS — I6782 Cerebral ischemia: Secondary | ICD-10-CM | POA: Diagnosis not present

## 2023-08-17 DIAGNOSIS — M6281 Muscle weakness (generalized): Secondary | ICD-10-CM | POA: Diagnosis not present

## 2023-08-17 DIAGNOSIS — I10 Essential (primary) hypertension: Secondary | ICD-10-CM | POA: Diagnosis not present

## 2023-08-17 DIAGNOSIS — S022XXA Fracture of nasal bones, initial encounter for closed fracture: Secondary | ICD-10-CM | POA: Diagnosis not present

## 2023-08-17 DIAGNOSIS — S0012XA Contusion of left eyelid and periocular area, initial encounter: Secondary | ICD-10-CM | POA: Diagnosis not present

## 2023-08-17 DIAGNOSIS — M47812 Spondylosis without myelopathy or radiculopathy, cervical region: Secondary | ICD-10-CM | POA: Diagnosis not present

## 2023-08-17 DIAGNOSIS — E785 Hyperlipidemia, unspecified: Secondary | ICD-10-CM | POA: Diagnosis not present

## 2023-08-17 DIAGNOSIS — S0011XA Contusion of right eyelid and periocular area, initial encounter: Secondary | ICD-10-CM | POA: Diagnosis not present

## 2023-08-17 DIAGNOSIS — R296 Repeated falls: Secondary | ICD-10-CM | POA: Diagnosis not present

## 2023-08-17 DIAGNOSIS — I480 Paroxysmal atrial fibrillation: Secondary | ICD-10-CM | POA: Diagnosis not present

## 2023-08-17 DIAGNOSIS — M5030 Other cervical disc degeneration, unspecified cervical region: Secondary | ICD-10-CM | POA: Diagnosis not present

## 2023-08-17 DIAGNOSIS — S0083XA Contusion of other part of head, initial encounter: Secondary | ICD-10-CM | POA: Diagnosis not present

## 2023-08-17 DIAGNOSIS — E119 Type 2 diabetes mellitus without complications: Secondary | ICD-10-CM | POA: Diagnosis not present

## 2023-08-17 DIAGNOSIS — R2681 Unsteadiness on feet: Secondary | ICD-10-CM | POA: Diagnosis not present

## 2023-08-17 LAB — CBG MONITORING, ED
Glucose-Capillary: 90 mg/dL (ref 70–99)
Glucose-Capillary: 174 mg/dL — ABNORMAL HIGH (ref 70–99)

## 2023-08-17 NOTE — ED Notes (Signed)
Report off to [employer] preyer rn

## 2023-08-17 NOTE — ED Notes (Signed)
Resumed care from ally rn  pt alert.  Pt up to br.  No acute distress   meds given.

## 2023-08-17 NOTE — ED Provider Notes (Signed)
-----------------------------------------   11:37 PM on 08/17/2023 -----------------------------------------   Blood pressure (!) 191/67, pulse 62, temperature 98 F (36.7 C), temperature source Oral, resp. rate 19, height 5\' 2"  (1.575 m), weight 59 kg, SpO2 100%.  The patient is calm and cooperative at this time.  There have been no acute events since the last update.  Awaiting disposition plan from [facility] team.   Janith Lima, MD 08/17/23 2337

## 2023-08-17 NOTE — ED Notes (Signed)
Fsbs 208

## 2023-08-18 DIAGNOSIS — S0011XA Contusion of right eyelid and periocular area, initial encounter: Secondary | ICD-10-CM | POA: Diagnosis not present

## 2023-08-18 DIAGNOSIS — I10 Essential (primary) hypertension: Secondary | ICD-10-CM | POA: Diagnosis not present

## 2023-08-18 DIAGNOSIS — M47812 Spondylosis without myelopathy or radiculopathy, cervical region: Secondary | ICD-10-CM | POA: Diagnosis not present

## 2023-08-18 DIAGNOSIS — S0083XA Contusion of other part of head, initial encounter: Secondary | ICD-10-CM | POA: Diagnosis not present

## 2023-08-18 DIAGNOSIS — E119 Type 2 diabetes mellitus without complications: Secondary | ICD-10-CM | POA: Diagnosis not present

## 2023-08-18 DIAGNOSIS — I6782 Cerebral ischemia: Secondary | ICD-10-CM | POA: Diagnosis not present

## 2023-08-18 DIAGNOSIS — I480 Paroxysmal atrial fibrillation: Secondary | ICD-10-CM | POA: Diagnosis not present

## 2023-08-18 DIAGNOSIS — M6281 Muscle weakness (generalized): Secondary | ICD-10-CM | POA: Diagnosis not present

## 2023-08-18 DIAGNOSIS — R2681 Unsteadiness on feet: Secondary | ICD-10-CM | POA: Diagnosis not present

## 2023-08-18 DIAGNOSIS — E785 Hyperlipidemia, unspecified: Secondary | ICD-10-CM | POA: Diagnosis not present

## 2023-08-18 DIAGNOSIS — S022XXA Fracture of nasal bones, initial encounter for closed fracture: Secondary | ICD-10-CM | POA: Diagnosis not present

## 2023-08-18 DIAGNOSIS — M5030 Other cervical disc degeneration, unspecified cervical region: Secondary | ICD-10-CM | POA: Diagnosis not present

## 2023-08-18 DIAGNOSIS — R296 Repeated falls: Secondary | ICD-10-CM | POA: Diagnosis not present

## 2023-08-18 DIAGNOSIS — S0012XA Contusion of left eyelid and periocular area, initial encounter: Secondary | ICD-10-CM | POA: Diagnosis not present

## 2023-08-18 LAB — CBC
HCT: 35.6 % — ABNORMAL LOW (ref 36.0–46.0)
Hemoglobin: 11.6 g/dL — ABNORMAL LOW (ref 12.0–15.0)
MCH: 29.6 pg (ref 26.0–34.0)
MCHC: 32.6 g/dL (ref 30.0–36.0)
MCV: 90.8 fL (ref 80.0–100.0)
Platelets: 253 10*3/uL (ref 150–400)
RBC: 3.92 MIL/uL (ref 3.87–5.11)
RDW: 13.9 % (ref 11.5–15.5)
WBC: 9.2 10*3/uL (ref 4.0–10.5)
nRBC: 0 % (ref 0.0–0.2)

## 2023-08-18 LAB — PROTIME-INR
INR: 1 (ref 0.8–1.2)
Prothrombin Time: 13.6 s (ref 11.4–15.2)

## 2023-08-18 LAB — CBG MONITORING, ED
Glucose-Capillary: 208 mg/dL — ABNORMAL HIGH (ref 70–99)
Glucose-Capillary: 159 mg/dL — ABNORMAL HIGH (ref 70–99)
Glucose-Capillary: 90 mg/dL (ref 70–99)
Glucose-Capillary: 194 mg/dL — ABNORMAL HIGH (ref 70–99)
Glucose-Capillary: 156 mg/dL — ABNORMAL HIGH (ref 70–99)
Glucose-Capillary: 234 mg/dL — ABNORMAL HIGH (ref 70–99)

## 2023-08-18 MED ORDER — HYDROCORTISONE 1 % EX CREA: TOPICAL_CREAM | Freq: Two times a day (BID) | CUTANEOUS | Status: DC

## 2023-08-18 MED ORDER — HYDROCORTISONE (PERIANAL) 2.5 % EX CREA
TOPICAL_CREAM | Freq: Two times a day (BID) | CUTANEOUS | Status: DC
  Administered 2023-08-18 – 2023-08-22 (×2): 1 via RECTAL
  Filled 2023-08-18: qty 28.35

## 2023-08-18 NOTE — ED Notes (Addendum)
Pt CBG obtained. CBG 194.

## 2023-08-18 NOTE — ED Notes (Signed)
Pt c/o bleeding to rectum, states " I think my hemorrhoid is back". Blood noted to underwear and in toilet. Dr Fanny Bien notified

## 2023-08-18 NOTE — ED Notes (Signed)
Pt given hydrocortisone cream for hemorrhoids. RN applied cream. No bleeding or blood at anus at this time. Small external hemorrhoid observed. Pt denies any discomfort.

## 2023-08-18 NOTE — ED Provider Notes (Signed)
-----------------------------------------   5:44 AM on 08/18/2023 -----------------------------------------   Blood pressure (!) 191/67, pulse 62, temperature 98 F (36.7 C), temperature source Oral, resp. rate 19, height 5\' 2"  (1.575 m), weight 59 kg, SpO2 100%.  The patient is calm and cooperative at this time.  There have been no acute events since the last update.  Awaiting disposition plan from Social Work team.   Irean Hong, MD 08/18/23 [phone]

## 2023-08-18 NOTE — ED Notes (Signed)
Patient is resting comfortably. 

## 2023-08-18 NOTE — ED Notes (Signed)
Daughter at bedside.

## 2023-08-18 NOTE — ED Provider Notes (Signed)
-----------------------------------------   8:45 AM on 08/18/2023 ----------------------------------------- Patient was noted to have blood with her bowel movement this morning.  Nursing notified me right away.  She has a few drops of red blood, and on examination patient reports she thinks she might have a bleeding hemorrhoid.  On exam she does have an external hemorrhoid, it is not actively bleeding but has a small area of clot about the size of a pinhead on it.  Consistent with external hemorrhoid.  No active bleeding.  She is awake, alert, no distress.   Hgb 11.6, mild decrease (1) from prior. Patient VSS, no distress. Suspect hemorrhoid.   Sharyn Creamer, MD 08/31/23 2126

## 2023-08-18 NOTE — ED Notes (Signed)
 Assisted with breakfast tray set up

## 2023-08-18 NOTE — ED Notes (Signed)
Pt given night time medications and warm blanket. Pt in bed at this time. Denies pain.

## 2023-08-19 DIAGNOSIS — M47812 Spondylosis without myelopathy or radiculopathy, cervical region: Secondary | ICD-10-CM | POA: Diagnosis not present

## 2023-08-19 DIAGNOSIS — M5030 Other cervical disc degeneration, unspecified cervical region: Secondary | ICD-10-CM | POA: Diagnosis not present

## 2023-08-19 DIAGNOSIS — S022XXA Fracture of nasal bones, initial encounter for closed fracture: Secondary | ICD-10-CM | POA: Diagnosis not present

## 2023-08-19 DIAGNOSIS — I6782 Cerebral ischemia: Secondary | ICD-10-CM | POA: Diagnosis not present

## 2023-08-19 DIAGNOSIS — S0012XA Contusion of left eyelid and periocular area, initial encounter: Secondary | ICD-10-CM | POA: Diagnosis not present

## 2023-08-19 DIAGNOSIS — I10 Essential (primary) hypertension: Secondary | ICD-10-CM | POA: Diagnosis not present

## 2023-08-19 DIAGNOSIS — S0083XA Contusion of other part of head, initial encounter: Secondary | ICD-10-CM | POA: Diagnosis not present

## 2023-08-19 DIAGNOSIS — E785 Hyperlipidemia, unspecified: Secondary | ICD-10-CM | POA: Diagnosis not present

## 2023-08-19 DIAGNOSIS — M6281 Muscle weakness (generalized): Secondary | ICD-10-CM | POA: Diagnosis not present

## 2023-08-19 DIAGNOSIS — R2681 Unsteadiness on feet: Secondary | ICD-10-CM | POA: Diagnosis not present

## 2023-08-19 DIAGNOSIS — I480 Paroxysmal atrial fibrillation: Secondary | ICD-10-CM | POA: Diagnosis not present

## 2023-08-19 DIAGNOSIS — R296 Repeated falls: Secondary | ICD-10-CM | POA: Diagnosis not present

## 2023-08-19 DIAGNOSIS — E119 Type 2 diabetes mellitus without complications: Secondary | ICD-10-CM | POA: Diagnosis not present

## 2023-08-19 DIAGNOSIS — S0011XA Contusion of right eyelid and periocular area, initial encounter: Secondary | ICD-10-CM | POA: Diagnosis not present

## 2023-08-19 LAB — CBG MONITORING, ED
Glucose-Capillary: 79 mg/dL (ref 70–99)
Glucose-Capillary: 112 mg/dL — ABNORMAL HIGH (ref 70–99)
Glucose-Capillary: 261 mg/dL — ABNORMAL HIGH (ref 70–99)
Glucose-Capillary: 201 mg/dL — ABNORMAL HIGH (ref 70–99)

## 2023-08-19 NOTE — TOC Progression Note (Signed)
Transition of Care [facility]) - Progression Note    Patient Details  Name: [name] MRN: [id number] Date of Birth: 04-08-1929  [facility] of [facility] CM/SW Contact  Marquita Palms, LCSW Phone Number: 08/19/2023, 11:25 AM  Clinical Narrative:     CSW spoke with [facility]; she stated that she will look at patient again and call CSW back to discuss placement. CSW will call patient's son upon further knowledge of placement. Awaiting call back.  Expected Discharge Plan: Skilled Nursing Facility Barriers to Discharge: Continued Medical Work up  Expected Discharge Plan and Services       Living arrangements for the past 2 months: Single Family Home                                       Social Determinants of Health (SDOH) Interventions SDOH Screenings   Food Insecurity: No Food Insecurity (05/19/2023)   Received from [facility]  Housing: Low Risk  (11/08/2022)  Transportation Needs: No Transportation Needs (05/19/2023)   Received from [facility]  Utilities: Not At Risk (05/19/2023)   Received from [facility]  Alcohol Screen: Low Risk  (05/20/2022)  Depression (PHQ2-9): Low Risk  (06/21/2022)  Financial Resource Strain: Low Risk  (05/19/2023)   Received from [facility]  Physical Activity: Inactive (05/19/2023)   Received from [facility]  Social Connections: Moderately Isolated (05/20/2022)  Stress: No Stress Concern Present (05/20/2022)  Tobacco Use: Low Risk  (07/23/2023)    Readmission Risk Interventions    08/02/2022    1:07 PM  Readmission Risk Prevention Plan  Transportation Screening Complete  Medication Review (RN Care Manager) Complete  PCP or Specialist appointment within 3-5 days of discharge Complete  HRI or Home Care Consult Complete  SW Recovery Care/Counseling Consult Complete  Palliative Care Screening Not Applicable  Skilled  Nursing Facility Not Applicable

## 2023-08-19 NOTE — ED Notes (Signed)
Today, when this RN started assuming care of this pt, pt thought that she was going to be picked up and brought to the hair salon.  Pt was easily redirected or calmed/re-diverted otherwise.  It was at this time that the pt refused her lunch because she thought she was leaving.   Pt has a visitor that brought her a flower and a Christmas decoration and the pt learned, through the visitor, that she wasn't going to the hairdresser, pt became tearful and did yell out of frustration. This RN was able to calm and redirect the pt, but the pt was upset about her length of stay here, how her son just left her here and wanted her to get into a nursing home. She stated that she was going to try to escape, but has made no effort to.  The visitor was able to talk to her and calm her some more and then pt was then interested in her lunch and ate it.  Water given to pt--pt doing well at this time.

## 2023-08-19 NOTE — ED Notes (Signed)
Pt is calmer now, seemingly happy--eating dinner, no complaints and has not, thus far, refused any medications.

## 2023-08-19 NOTE — ED Provider Notes (Signed)
Emergency Medicine Observation Re-evaluation Note  [name] is a 89 y.o. female, awaiting placement  Physical Exam  BP (!) 157/63   Pulse 74   Temp 98.2 F (36.8 C) (Oral)   Resp 16   Ht 5\' 2"  (1.575 m)   Wt 59 kg   SpO2 98%   BMI 23.79 kg/m  Physical Exam General: resting comfortably   ED Course / MDM  Sugar elevated today  Plan  Current plan is for placement.    Phineas Semen, MD 08/19/23 [phone]

## 2023-08-19 NOTE — ED Notes (Signed)
Pt was hallucinating earlier talking to people in the room. This RN asked the pt with whom she was speaking to, and the pt stated that she was talking "To that man over there and the woman in the bed." When it was explained to the pt that there was no one over there and that this RN did not see anyone over there, she seemingly accepted that as an answer and she said, "I guess that I was just talking to myself then."  Pt still believes that she is going to go home tonight and that someone is going to take her to the hair salon tomorrow.

## 2023-08-20 DIAGNOSIS — M47812 Spondylosis without myelopathy or radiculopathy, cervical region: Secondary | ICD-10-CM | POA: Diagnosis not present

## 2023-08-20 DIAGNOSIS — R2681 Unsteadiness on feet: Secondary | ICD-10-CM | POA: Diagnosis not present

## 2023-08-20 DIAGNOSIS — R296 Repeated falls: Secondary | ICD-10-CM | POA: Diagnosis not present

## 2023-08-20 DIAGNOSIS — S0012XA Contusion of left eyelid and periocular area, initial encounter: Secondary | ICD-10-CM | POA: Diagnosis not present

## 2023-08-20 DIAGNOSIS — I480 Paroxysmal atrial fibrillation: Secondary | ICD-10-CM | POA: Diagnosis not present

## 2023-08-20 DIAGNOSIS — E119 Type 2 diabetes mellitus without complications: Secondary | ICD-10-CM | POA: Diagnosis not present

## 2023-08-20 DIAGNOSIS — S0011XA Contusion of right eyelid and periocular area, initial encounter: Secondary | ICD-10-CM | POA: Diagnosis not present

## 2023-08-20 DIAGNOSIS — I10 Essential (primary) hypertension: Secondary | ICD-10-CM | POA: Diagnosis not present

## 2023-08-20 DIAGNOSIS — S022XXA Fracture of nasal bones, initial encounter for closed fracture: Secondary | ICD-10-CM | POA: Diagnosis not present

## 2023-08-20 DIAGNOSIS — M5030 Other cervical disc degeneration, unspecified cervical region: Secondary | ICD-10-CM | POA: Diagnosis not present

## 2023-08-20 DIAGNOSIS — E785 Hyperlipidemia, unspecified: Secondary | ICD-10-CM | POA: Diagnosis not present

## 2023-08-20 DIAGNOSIS — M6281 Muscle weakness (generalized): Secondary | ICD-10-CM | POA: Diagnosis not present

## 2023-08-20 DIAGNOSIS — I6782 Cerebral ischemia: Secondary | ICD-10-CM | POA: Diagnosis not present

## 2023-08-20 DIAGNOSIS — S0083XA Contusion of other part of head, initial encounter: Secondary | ICD-10-CM | POA: Diagnosis not present

## 2023-08-20 LAB — CBG MONITORING, ED
Glucose-Capillary: 113 mg/dL — ABNORMAL HIGH (ref 70–99)
Glucose-Capillary: 236 mg/dL — ABNORMAL HIGH (ref 70–99)
Glucose-Capillary: 228 mg/dL — ABNORMAL HIGH (ref 70–99)
Glucose-Capillary: 80 mg/dL (ref 70–99)

## 2023-08-20 LAB — BASIC METABOLIC PANEL
Anion gap: 7 (ref 5–15)
BUN: 36 mg/dL — ABNORMAL HIGH (ref 8–23)
CO2: 28 mmol/L (ref 22–32)
Calcium: 8.4 mg/dL — ABNORMAL LOW (ref 8.9–10.3)
Chloride: 97 mmol/L — ABNORMAL LOW (ref 98–111)
Creatinine, Ser: 1.17 mg/dL — ABNORMAL HIGH (ref 0.44–1.00)
GFR, Estimated: 44 mL/min — ABNORMAL LOW (ref >60)
Glucose, Bld: 161 mg/dL — ABNORMAL HIGH (ref 70–99)
Potassium: 4.2 mmol/L (ref 3.5–5.1)
Sodium: 132 mmol/L — ABNORMAL LOW (ref 135–145)

## 2023-08-20 LAB — URINALYSIS, ROUTINE W REFLEX MICROSCOPIC
Bilirubin Urine: NEGATIVE
Glucose, UA: NEGATIVE mg/dL
Ketones, ur: NEGATIVE mg/dL
Nitrite: NEGATIVE
Protein, ur: 30 mg/dL — AB
Specific Gravity, Urine: 1.015 (ref 1.005–1.030)
pH: 5 (ref 5.0–8.0)

## 2023-08-20 MED ORDER — HALOPERIDOL LACTATE 5 MG/ML IJ SOLN
5.0000 mg | Freq: Once | INTRAMUSCULAR | Status: AC
  Administered 2023-08-20: 5 mg via INTRAMUSCULAR
  Filled 2023-08-20: qty 1

## 2023-08-20 NOTE — ED Notes (Signed)
This nurse asked if pt would like to take a shower. Pt stated "I took a shower 3 weeks ago". This nursed asked if the pt would like to take a shower now, Pt declined " I will take one when I get home, its too cold here" This nurse reassured the pt that she could get a warm blanket after the shower. Pt declined.

## 2023-08-20 NOTE — ED Provider Notes (Addendum)
-----------------------------------------   6:50 AM on 08/20/2023 -----------------------------------------   Blood pressure (!) 157/63, pulse 74, temperature 98.2 F (36.8 C), temperature source Oral, resp. rate 16, height 5\' 2"  (1.575 m), weight 59 kg, SpO2 98%.  The patient is calm and cooperative at this time.  Received IM Haldol for agitation unable to be verbally redirected.  Awaiting disposition plan from Social Work team.   Irean Hong, MD 08/20/23 2725    Irean Hong, MD 08/20/23 [phone]

## 2023-08-20 NOTE — ED Notes (Signed)
Pt in chair next to bed. Pt refusing to move to bed or recliner.

## 2023-08-20 NOTE — ED Notes (Signed)
Pt is sleeping in recliner. Equal rise and fall of chest noted. NAD. Call light within reach

## 2023-08-20 NOTE — Progress Notes (Signed)
Mobility Specialist - Progress Note     08/20/23 1100  Mobility  Activity Ambulated with assistance in hallway;Stood at bedside  Level of Assistance Standby assist, set-up cues, supervision of patient - no hands on  Assistive Device Front wheel walker  Distance Ambulated (ft) 320 ft  Range of Motion/Exercises Active  Activity Response Tolerated well  Mobility Referral Yes  $Mobility charge 1 Mobility  Mobility Specialist Start Time (ACUTE ONLY) 1105  Mobility Specialist Stop Time (ACUTE ONLY) 1120  Mobility Specialist Time Calculation (min) (ACUTE ONLY) 15 min   Pt resting in recliner sleeping on RA upon entry. Pt STS and ambulates around NS in ED department SBA with RW. Pt given directional cuing and redirection to avoid frustration. Pt returned to recliner and left with needs in reach.   Johnathan Hausen Mobility Specialist 08/20/23, 12:41 PM

## 2023-08-20 NOTE — ED Notes (Signed)
Pt coming out of room, yelling for jack to come pick her up. This RN, Zachery Dakins NT, and security attempted to redirect with no success. Dolores Frame MD made aware.

## 2023-08-20 NOTE — ED Notes (Signed)
Pt coming out of room asking for robert and jack to pick her up. Pt was reoriented and was successfully taken back to her room.

## 2023-08-20 NOTE — ED Notes (Signed)
Pt given apple juice per her request.

## 2023-08-20 NOTE — ED Notes (Signed)
Pt is calm and sitting in recliner. No needs expressed. NAD. Call light within reach

## 2023-08-20 NOTE — ED Notes (Signed)
Urine sample was contaminated with toilet paper. Hat emptied and reapplied in commode

## 2023-08-20 NOTE — ED Notes (Addendum)
Pt is tearful upon assessment. Pt stated "My son left me, that son of a bitch". This nurse asked pt how to comfort her. Pt stated "Nothing". Pt is crying and refuses to eat dinner. Pt was offered tissues, pt declined. Pt is now talking out loud in tears stating "He left me here, I have been here for 2 months".

## 2023-08-20 NOTE — ED Notes (Signed)
Pt is asking to leave.  Pt is standing in doorway.Pt states "When is my son coming to pick me up" This nurse redirected the pt back to room. NAD.

## 2023-08-20 NOTE — ED Notes (Signed)
Son at bedside. Pt requested meal to be warmed. This nurse warmed up the lunch. NAD. Call light within reach

## 2023-08-20 NOTE — ED Notes (Signed)
Pt was moved from hall back to room 25 with assistance from [employer], Geneticist, molecular, and security. Pt was given haldol shot. Pt staying in room at this time. No needs expressed.

## 2023-08-21 DIAGNOSIS — S0083XA Contusion of other part of head, initial encounter: Secondary | ICD-10-CM | POA: Diagnosis not present

## 2023-08-21 DIAGNOSIS — E119 Type 2 diabetes mellitus without complications: Secondary | ICD-10-CM | POA: Diagnosis not present

## 2023-08-21 DIAGNOSIS — S0012XA Contusion of left eyelid and periocular area, initial encounter: Secondary | ICD-10-CM | POA: Diagnosis not present

## 2023-08-21 DIAGNOSIS — S0011XA Contusion of right eyelid and periocular area, initial encounter: Secondary | ICD-10-CM | POA: Diagnosis not present

## 2023-08-21 DIAGNOSIS — I10 Essential (primary) hypertension: Secondary | ICD-10-CM | POA: Diagnosis not present

## 2023-08-21 DIAGNOSIS — M5030 Other cervical disc degeneration, unspecified cervical region: Secondary | ICD-10-CM | POA: Diagnosis not present

## 2023-08-21 DIAGNOSIS — S022XXA Fracture of nasal bones, initial encounter for closed fracture: Secondary | ICD-10-CM | POA: Diagnosis not present

## 2023-08-21 DIAGNOSIS — M6281 Muscle weakness (generalized): Secondary | ICD-10-CM | POA: Diagnosis not present

## 2023-08-21 DIAGNOSIS — I6782 Cerebral ischemia: Secondary | ICD-10-CM | POA: Diagnosis not present

## 2023-08-21 DIAGNOSIS — E785 Hyperlipidemia, unspecified: Secondary | ICD-10-CM | POA: Diagnosis not present

## 2023-08-21 DIAGNOSIS — R2681 Unsteadiness on feet: Secondary | ICD-10-CM | POA: Diagnosis not present

## 2023-08-21 DIAGNOSIS — M47812 Spondylosis without myelopathy or radiculopathy, cervical region: Secondary | ICD-10-CM | POA: Diagnosis not present

## 2023-08-21 DIAGNOSIS — I480 Paroxysmal atrial fibrillation: Secondary | ICD-10-CM | POA: Diagnosis not present

## 2023-08-21 DIAGNOSIS — R296 Repeated falls: Secondary | ICD-10-CM | POA: Diagnosis not present

## 2023-08-21 LAB — CBG MONITORING, ED
Glucose-Capillary: 219 mg/dL — ABNORMAL HIGH (ref 70–99)
Glucose-Capillary: 214 mg/dL — ABNORMAL HIGH (ref 70–99)
Glucose-Capillary: 127 mg/dL — ABNORMAL HIGH (ref 70–99)
Glucose-Capillary: 188 mg/dL — ABNORMAL HIGH (ref 70–99)

## 2023-08-21 MED ORDER — FOSFOMYCIN TROMETHAMINE 3 G PO PACK
3.0000 g | PACK | Freq: Once | ORAL | Status: AC
  Administered 2023-08-21: 3 g via ORAL
  Filled 2023-08-21: qty 3

## 2023-08-21 MED ORDER — HYDROXYZINE HCL 25 MG PO TABS
25.0000 mg | ORAL_TABLET | Freq: Once | ORAL | Status: AC
  Administered 2023-08-21: 25 mg via ORAL
  Filled 2023-08-21: qty 1

## 2023-08-21 NOTE — ED Notes (Signed)
This RN provided pt with pericare and then assisted pt to recliner per her request.

## 2023-08-21 NOTE — ED Notes (Signed)
Pt ambulated to in room toilet with supervision.  States that she would like to get into the bed.

## 2023-08-21 NOTE — ED Notes (Signed)
Patient's lunch tray delivered.

## 2023-08-21 NOTE — ED Provider Notes (Signed)
-----------------------------------------   5:22 AM on 08/21/2023 -----------------------------------------   Blood pressure (!) 161/76, pulse 67, temperature 98 F (36.7 C), temperature source Oral, resp. rate 18, height 5\' 2"  (1.575 m), weight 59 kg, SpO2 98%.  The patient is calm and cooperative at this time.  There have been no acute events since the last update.  Awaiting disposition plan from case management/social work.    Maxden Naji, Layla Maw, DO 08/21/23 Jeralyn Bennett

## 2023-08-21 NOTE — ED Notes (Signed)
Patient eating breakfast tray comfortably at the bedside.

## 2023-08-21 NOTE — ED Notes (Signed)
Pt continues to eat milkshake, calm at this time.

## 2023-08-21 NOTE — ED Notes (Signed)
Pt noted to be in bed at this time.

## 2023-08-21 NOTE — ED Notes (Signed)
Pt has stated multiple times that "Ree Kida" is outside and he has come to take her home.  She now stated that she has been talking with him and that he brought her two hamburgers and "gave them to 2 different women."  Pt redirected to her room.

## 2023-08-21 NOTE — ED Notes (Signed)
Patient sitting upright in recliner.

## 2023-08-21 NOTE — ED Notes (Signed)
Pt continues to leave room and look for exit stating that her son Ree Kida is outside waiting for her.  Redirected back to room with peanut butter milkshake.

## 2023-08-22 DIAGNOSIS — S0083XA Contusion of other part of head, initial encounter: Secondary | ICD-10-CM | POA: Diagnosis not present

## 2023-08-22 DIAGNOSIS — M6281 Muscle weakness (generalized): Secondary | ICD-10-CM | POA: Diagnosis not present

## 2023-08-22 DIAGNOSIS — S0011XA Contusion of right eyelid and periocular area, initial encounter: Secondary | ICD-10-CM | POA: Diagnosis not present

## 2023-08-22 DIAGNOSIS — I10 Essential (primary) hypertension: Secondary | ICD-10-CM | POA: Diagnosis not present

## 2023-08-22 DIAGNOSIS — M47812 Spondylosis without myelopathy or radiculopathy, cervical region: Secondary | ICD-10-CM | POA: Diagnosis not present

## 2023-08-22 DIAGNOSIS — M5030 Other cervical disc degeneration, unspecified cervical region: Secondary | ICD-10-CM | POA: Diagnosis not present

## 2023-08-22 DIAGNOSIS — S022XXA Fracture of nasal bones, initial encounter for closed fracture: Secondary | ICD-10-CM | POA: Diagnosis not present

## 2023-08-22 DIAGNOSIS — E119 Type 2 diabetes mellitus without complications: Secondary | ICD-10-CM | POA: Diagnosis not present

## 2023-08-22 DIAGNOSIS — E785 Hyperlipidemia, unspecified: Secondary | ICD-10-CM | POA: Diagnosis not present

## 2023-08-22 DIAGNOSIS — I6782 Cerebral ischemia: Secondary | ICD-10-CM | POA: Diagnosis not present

## 2023-08-22 DIAGNOSIS — I480 Paroxysmal atrial fibrillation: Secondary | ICD-10-CM | POA: Diagnosis not present

## 2023-08-22 DIAGNOSIS — R2681 Unsteadiness on feet: Secondary | ICD-10-CM | POA: Diagnosis not present

## 2023-08-22 DIAGNOSIS — R296 Repeated falls: Secondary | ICD-10-CM | POA: Diagnosis not present

## 2023-08-22 DIAGNOSIS — S0012XA Contusion of left eyelid and periocular area, initial encounter: Secondary | ICD-10-CM | POA: Diagnosis not present

## 2023-08-22 LAB — CBG MONITORING, ED
Glucose-Capillary: 76 mg/dL (ref 70–99)
Glucose-Capillary: 212 mg/dL — ABNORMAL HIGH (ref 70–99)
Glucose-Capillary: 241 mg/dL — ABNORMAL HIGH (ref 70–99)
Glucose-Capillary: 92 mg/dL (ref 70–99)

## 2023-08-22 MED ORDER — QUETIAPINE FUMARATE 200 MG PO TABS
200.0000 mg | ORAL_TABLET | Freq: Every day | ORAL | Status: DC
  Administered 2023-08-22: 200 mg via ORAL
  Filled 2023-08-22: qty 1

## 2023-08-22 MED ORDER — QUETIAPINE FUMARATE 25 MG PO TABS
50.0000 mg | ORAL_TABLET | ORAL | Status: DC
  Administered 2023-08-23 (×2): 50 mg via ORAL
  Filled 2023-08-22 (×2): qty 2

## 2023-08-22 NOTE — ED Notes (Addendum)
Upon request from daughter and patient with assistance of Arianne, NT gave patient a shower and shampooed hair. Completely changed into new clothing, socks and underwear. Lotion applied to legs. Hair combed. Deoderant applied. Bedside cleaned and wiped down. New incontinence pad put on bed. New blankets given.

## 2023-08-22 NOTE — ED Notes (Signed)
Patient received dinner tray at this time.  

## 2023-08-22 NOTE — ED Provider Notes (Signed)
-----------------------------------------   4:48 AM on 08/22/2023 -----------------------------------------   Blood pressure (!) 174/68, pulse 71, temperature (!) 97.5 F (36.4 C), temperature source Oral, resp. rate 17, height 5\' 2"  (1.575 m), weight 59 kg, SpO2 93%.  The patient is calm and cooperative at this time.  There have been no acute events since the last update.  Awaiting disposition plan from case management/social work.    Duke Weisensel, Layla Maw, DO 08/22/23 [phone]

## 2023-08-22 NOTE — Consult Note (Signed)
[facility] Face-to-Face Psychiatry Consult   Reason for Consult:  Consult Referring Physician:  Dr. Irean Hong Patient Identification: [name] MRN:  [id number] Principal Diagnosis: Dementia [facility]) Diagnosis:  Principal Problem:   Dementia (HCC)  Total Time spent with patient:  25 minutes  Subjective:  "going on 8 weeks"   HPI:   Pt chart reviewed and assessed face to face. Pt initially seen sleeping, although wakens to name being called. She is alert, oriented to name, date of birth, age. Incorrectly states we are in [place], although corrects herself to state we are in [place]. She is aware she in the hospital, although is not sure reason for admission. Reports she wants to go home. Initially states she was living with her husband and son prior to admission, although later states she was actually living alone. Does have some paranoia ideation, states that while she was living at home, people would come in through her closed window. States she was not scared of them though. She reports appetite has been fair. Reports sleep has been good. Although per review of notes, pt has been waking up, at times requiring redirection. Was given haldol 5mg  IM on 08/20/23. She denies suicidal, homicidal ideations. Denies current auditory visual hallucinations or paranoia.  Spoke w/ pt's son, CNIYA, FIGGERS, [phone]. Reports pt is diagnosed with lewy body dementia. Has been hallucinating seeing people she knows from her past. Hallucinations did not frighten her. However, prior to admission, pt was hallucinating someone she knew and in an attempt to run away, tripped and fell, leading to her current presentation at the emergency department. Does not feel pt can live on her own. Discussed pt's presentation to ED and discussed that case had been staffed with attending psychiatrist, who is recommending increasing seroquel from 50mg  to 200mg  oral daily at bedtime and starting pt on seroquel 50mg  oral  q-8AM and 4PM. Discussed side effects, adverse effects and black box warning, including increased risk of death. He verbalized understanding and gave verbal consent for medication changes.  Past Psychiatric History: Depression  Risk to Self: No Risk to Others: No  Past Medical History:  Past Medical History:  Diagnosis Date   Anemia    Angina pectoris (HCC)    Atrial fibrillation (HCC)    Basal cell carcinoma 09/30/2022   dorsum nose - [facility]   Cervicalgia    CHF (congestive heart failure) (HCC)    Chronic back pain    Chronic kidney disease    Chronic obstructive pulmonary disease (COPD) (HCC)    COPD (chronic obstructive pulmonary disease) (HCC)    DDD (degenerative disc disease), lumbar    Diabetes mellitus without complication (HCC)    Dysphagia    Hyperlipemia    Hypertension    Squamous cell carcinoma of skin    left lateral pretibial   Vulvovaginitis     Past Surgical History:  Procedure Laterality Date   gall bladder     melanoma     removal neck and back   PERIPHERAL VASCULAR CATHETERIZATION Left 01/05/2016   Procedure: Lower Extremity Angiography;  Surgeon: Annice Needy, MD;  Location: [facility] INVASIVE CV LAB;  Service: Cardiovascular;  Laterality: Left;   PERIPHERAL VASCULAR CATHETERIZATION  01/05/2016   Procedure: Lower Extremity Intervention;  Surgeon: Annice Needy, MD;  Location: [facility] INVASIVE CV LAB;  Service: Cardiovascular;;   ureterolithiasis     calculus removed   VAGINAL HYSTERECTOMY     VISCERAL ANGIOGRAPHY N/A 05/10/2019   Procedure: VISCERAL  ANGIOGRAPHY;  Surgeon: Annice Needy, MD;  Location: [facility] INVASIVE CV LAB;  Service: Cardiovascular;  Laterality: N/A;   VULVA / PERINEUM BIOPSY  05/29/2015   Family History:  Family History  Problem Relation Age of Onset   Diabetes Sister    Colon cancer Brother    Diabetes Brother    Atrial fibrillation Daughter    Ovarian cancer Other    Breast cancer Neg Hx    Family Psychiatric  History: None reported Social  History:  Social History   Substance and Sexual Activity  Alcohol Use No     Social History   Substance and Sexual Activity  Drug Use No    Social History   Socioeconomic History   Marital status: Widowed    Spouse name: Not on file   Number of children: 3   Years of education: Not on file   Highest education level: 8th grade  Occupational History   Occupation: retired  Tobacco Use   Smoking status: Never    Passive exposure: Never   Smokeless tobacco: Never  Vaping Use   Vaping status: Never Used  Substance and Sexual Activity   Alcohol use: No   Drug use: No   Sexual activity: Never  Other Topics Concern   Not on file  Social History Narrative   Not on file   Social Determinants of Health   Financial Resource Strain: Low Risk  (05/19/2023)   Received from [facility]   Overall Financial Resource Strain (CARDIA)    Difficulty of Paying Living Expenses: Not hard at all  Food Insecurity: No Food Insecurity (05/19/2023)   Received from [facility]   Hunger Vital Sign    Worried About Running Out of Food in the Last Year: Never true    Ran Out of Food in the Last Year: Never true  Transportation Needs: No Transportation Needs (05/19/2023)   Received [facility]   PRAPARE - Transportation    In the past 12 months, has lack of transportation kept you from medical appointments or from getting medications?: No    Lack of Transportation (Non-Medical): No  Physical Activity: Inactive (05/19/2023)   Received from [facility]   Exercise Vital Sign    Days of Exercise per Week: 0 days    Minutes of Exercise per Session: 0 min  Stress: No Stress Concern Present (05/20/2022)   [employer] of Occupational Health - Occupational Stress Questionnaire    Feeling of Stress : Not at all  Social Connections: Moderately Isolated (05/20/2022)   Social Connection and Isolation Panel [NHANES]    Frequency  of Communication with Friends and Family: More than three times a week    Frequency of Social Gatherings with Friends and Family: More than three times a week    Attends Religious Services: More than 4 times per year    Active Member of [employer] or Organizations: No    Attends Banker Meetings: Never    Marital Status: Widowed   Additional Social History:    Allergies:   Allergies  Allergen Reactions   Bacitracin-Neomycin-Polymyxin Rash   Neomycin-Bacitracin Zn-Polymyx Swelling and Rash   Latex Rash   Lidocaine Rash   Aricept [Donepezil Hcl] Diarrhea   Benzalkonium Chloride Itching and Swelling   Ibuprofen     Other reaction(s): Dizziness Heart fluttering. Tachycardia. Tachycardia.   Lidocaine Hcl Itching    Per pt, "all caine meds cause severe itching"  Valdecoxib Nausea And Vomiting   Albuterol Rash   Tape Rash    blisters   Triamcinolone Rash    Labs:  Results for orders placed or performed during the hospital encounter of 07/23/23 (from the past 48 hour(s))  CBG monitoring, ED     Status: None   Collection Time: 08/20/23 10:06 PM  Result Value Ref Range   Glucose-Capillary 80 70 - 99 mg/dL    Comment: Glucose reference range applies only to samples taken after fasting for at least 8 hours.  CBG monitoring, ED     Status: Abnormal   Collection Time: 08/21/23  7:58 AM  Result Value Ref Range   Glucose-Capillary 219 (H) 70 - 99 mg/dL    Comment: Glucose reference range applies only to samples taken after fasting for at least 8 hours.  CBG monitoring, ED     Status: Abnormal   Collection Time: 08/21/23 12:17 PM  Result Value Ref Range   Glucose-Capillary 214 (H) 70 - 99 mg/dL    Comment: Glucose reference range applies only to samples taken after fasting for at least 8 hours.  CBG monitoring, ED     Status: Abnormal   Collection Time: 08/21/23  6:01 PM  Result Value Ref Range   Glucose-Capillary 127 (H) 70 - 99 mg/dL    Comment: Glucose reference range  applies only to samples taken after fasting for at least 8 hours.  CBG monitoring, ED     Status: Abnormal   Collection Time: 08/21/23  9:56 PM  Result Value Ref Range   Glucose-Capillary 188 (H) 70 - 99 mg/dL    Comment: Glucose reference range applies only to samples taken after fasting for at least 8 hours.  CBG monitoring, ED     Status: None   Collection Time: 08/22/23  7:48 AM  Result Value Ref Range   Glucose-Capillary 76 70 - 99 mg/dL    Comment: Glucose reference range applies only to samples taken after fasting for at least 8 hours.  CBG monitoring, ED     Status: Abnormal   Collection Time: 08/22/23 11:14 AM  Result Value Ref Range   Glucose-Capillary 212 (H) 70 - 99 mg/dL    Comment: Glucose reference range applies only to samples taken after fasting for at least 8 hours.  CBG monitoring, ED     Status: Abnormal   Collection Time: 08/22/23 11:56 AM  Result Value Ref Range   Glucose-Capillary 241 (H) 70 - 99 mg/dL    Comment: Glucose reference range applies only to samples taken after fasting for at least 8 hours.  CBG monitoring, ED     Status: None   Collection Time: 08/22/23  4:40 PM  Result Value Ref Range   Glucose-Capillary 92 70 - 99 mg/dL    Comment: Glucose reference range applies only to samples taken after fasting for at least 8 hours.    Current Facility-Administered Medications  Medication Dose Route Frequency Provider Last Rate Last Admin   aspirin EC tablet 325 mg  325 mg Oral Daily Merwyn Katos, MD   325 mg at 08/22/23 0947   atorvastatin (LIPITOR) tablet 10 mg  10 mg Oral Daily Merwyn Katos, MD   10 mg at 08/22/23 0946   carvedilol (COREG) tablet 3.125 mg  3.125 mg Oral BID Merwyn Katos, MD   3.125 mg at 08/22/23 0946   clopidogrel (PLAVIX) tablet 75 mg  75 mg Oral Daily Merwyn Katos, MD   75 mg  at 08/22/23 0946   Dupilumab SOAJ 300 mg  300 mg Subcutaneous Q14 Days Orson Aloe, RPH   300 mg at 07/27/23 1601   ezetimibe (ZETIA) tablet  10 mg  10 mg Oral Daily Merwyn Katos, MD   10 mg at 08/22/23 0946   FLUoxetine (PROZAC) capsule 10 mg  10 mg Oral TID Merwyn Katos, MD   10 mg at 08/22/23 1510   hydrocortisone (ANUSOL-HC) 2.5 % rectal cream   Rectal BID Sharyn Creamer, MD   Given at 08/22/23 0948   insulin aspart (novoLOG) injection 0-9 Units  0-9 Units Subcutaneous TID WC Merwyn Katos, MD   3 Units at 08/22/23 1202   insulin glargine-yfgn (SEMGLEE) injection 10 Units  10 Units Subcutaneous Daily Sharyn Creamer, MD   10 Units at 08/22/23 1047   isosorbide mononitrate (IMDUR) 24 hr tablet 30 mg  30 mg Oral Daily Merwyn Katos, MD   30 mg at 08/22/23 0946   loratadine (CLARITIN) tablet 10 mg  10 mg Oral Daily Merwyn Katos, MD   10 mg at 08/22/23 0946   memantine (NAMENDA) tablet 10 mg  10 mg Oral BID Merwyn Katos, MD   10 mg at 08/22/23 0946   mirabegron ER (MYRBETRIQ) tablet 25 mg  25 mg Oral Daily Merwyn Katos, MD   25 mg at 08/22/23 0947   pantoprazole (PROTONIX) EC tablet 20 mg  20 mg Oral Daily Merwyn Katos, MD   20 mg at 08/22/23 0947   QUEtiapine (SEROQUEL) tablet 200 mg  200 mg Oral QHS Lauree Chandler, NP       Melene Muller ON 08/23/2023] QUEtiapine (SEROQUEL) tablet 50 mg  50 mg Oral [id number] Lauree Chandler, NP       sacubitril-valsartan [facility]) 49-51 mg per tablet  1 tablet Oral BID Merwyn Katos, MD   1 tablet at 08/22/23 0947   Vitamin E CAPS 800 Units  800 Units Oral Daily Barrie Folk, RPH   800 Units at 08/22/23 4098   Current Outpatient Medications  Medication Sig Dispense Refill   atorvastatin (LIPITOR) 10 MG tablet TAKE 1 TABLET BY MOUTH DAILY 90 tablet 0   carvedilol (COREG) 3.125 MG tablet Take 1 tablet (3.125 mg total) by mouth 2 (two) times daily. 180 tablet 3   clopidogrel (PLAVIX) 75 MG tablet Take 1 tablet (75 mg total) by mouth daily. 30 tablet 3   ENTRESTO 49-51 MG TAKE ONE TABLET BY MOUTH TWICE DAILY 60 tablet 5   ezetimibe (ZETIA) 10 MG tablet TAKE 1 TABLET BY MOUTH  DAILY 90 tablet 1   FLUoxetine (PROZAC) 10 MG capsule TAKE 1 CAPSULE BY MOUTH 3 TIMES DAILY. 90 capsule 3   HUMALOG KWIKPEN 100 UNIT/ML KwikPen Inject 3 Units into the skin 3 (three) times daily. (Patient taking differently: Inject 12-15 Units into the skin 3 (three) times daily. 12 units with breakfast, 15 units with lunch, and 15 units with dinner) 15 mL 11   insulin glargine (LANTUS) 100 UNIT/ML injection Inject 0.06 mLs (6 Units total) into the skin at bedtime. (Patient taking differently: Inject 12 Units into the skin at bedtime.) 10 mL 11   isosorbide mononitrate (IMDUR) 30 MG 24 hr tablet Take 1 tablet (30 mg total) by mouth daily. Take 30 mg by mouth daily. 30 tablet 0   lansoprazole (PREVACID) 30 MG capsule TAKE 1 CAPSULE BY MOUTH ONCE DAILY AT NOON (Patient taking differently: Take 30 mg by mouth  daily at 12 noon.) 90 capsule 1   memantine (NAMENDA) 10 MG tablet Take 10 mg by mouth 2 (two) times daily.     QUEtiapine (SEROQUEL) 50 MG tablet Take 50 mg by mouth at bedtime.     Vibegron (GEMTESA) 75 MG TABS Take 1 tablet (75 mg total) by mouth daily. 30 tablet 11   vitamin E 1000 UNIT capsule Take 1,000 Units by mouth daily.     aspirin EC 325 MG tablet Take 325 mg by mouth daily.     Dupilumab (DUPIXENT) 300 MG/2ML SOAJ Inject 300 mg into the skin every 14 (fourteen) days. Starting at day 15 for maintenance. Please schedule follow up 4 mL 1   fexofenadine (ALLEGRA) 180 MG tablet Take 180 mg by mouth daily.     glucose blood test strip ACCU-CHEK AVIVA PLUS TEST STRP Use to check sugars 3 times daily.     Multiple Vitamins-Minerals (ICAPS AREDS 2 PO) Take 2 capsules by mouth daily.      Musculoskeletal: Strength & Muscle Tone:  pt sitting on assessment Gait & Station:  pt sitting on assessment Patient leans:  pt sitting on assessment            Psychiatric Specialty Exam:  Presentation  General Appearance:  Appropriate for Environment  Eye Contact: Fair  Speech: Clear  and Coherent; Normal Rate  Speech Volume: Normal  Handedness: Right   Mood and Affect  Mood: Anxious  Affect: Blunt   Thought Process  Thought Processes: Coherent  Descriptions of Associations:Intact  Orientation:Partial  Thought Content:Paranoid Ideation; Logical  History of Schizophrenia/Schizoaffective disorder:No  Duration of Psychotic Symptoms:Greater than six months  Hallucinations:Hallucinations: None  Ideas of Reference:Paranoia  Suicidal Thoughts:Suicidal Thoughts: No  Homicidal Thoughts:Homicidal Thoughts: No   Sensorium  Memory: Immediate Fair  Judgment: Intact  Insight: Shallow   Executive Functions  Concentration: Fair  Attention Span: Fair  Recall: Fair  Fund of Knowledge: Fair  Language: Fair   Psychomotor Activity  Psychomotor Activity:Psychomotor Activity: Decreased   Assets  Assets: Communication Skills; Desire for Improvement; Financial Resources/Insurance; Resilience; Social Support   Sleep  Sleep:Sleep: Fair   Physical Exam: Physical Exam Constitutional:      General: She is not in acute distress.    Appearance: She is not ill-appearing, toxic-appearing or diaphoretic.  Eyes:     General: No scleral icterus. Cardiovascular:     Rate and Rhythm: Normal rate.  Neurological:     Mental Status: She is alert.  Psychiatric:        Attention and Perception: Attention and perception normal.        Mood and Affect: Mood is anxious. Affect is blunt.        Speech: Speech normal.        Behavior: Behavior is slowed. Behavior is cooperative.        Thought Content: Thought content is paranoid. Thought content does not include homicidal or suicidal ideation.    Review of Systems  Constitutional:  Negative for chills and fever.  Respiratory:  Negative for shortness of breath.   Cardiovascular:  Negative for chest pain and palpitations.  Gastrointestinal:  Negative for abdominal pain.  Neurological:   Negative for headaches.  Psychiatric/Behavioral:  The patient is nervous/anxious.    Blood pressure (!) 159/72, pulse 65, temperature 97.7 F (36.5 C), temperature source Oral, resp. rate 16, height 5\' 2"  (1.575 m), weight 59 kg, SpO2 95%. Body mass index is 23.79 kg/m.  Treatment Plan Summary: Medication management  and Plan    Case staffed with attending psychiatrist, Dr. Marlou Porch, who is recommending increasing seroquel from 50mg  oral daily at bedtime to 200mg  daily at bedtime, starting seroquel 50mg  oral at q-8AM and 4PM. This was discussed with pt's POA Eber Hong at [phone]. Discussed side effects, adverse effects and black box warning, including increased risk of death. He verbalized understanding and gave verbal consent for medication changes. Have ordered EKG.   Disposition: No evidence of imminent risk to self or others at present.   Patient does not meet criteria for psychiatric inpatient admission.TOC is working on disposition.  Lauree Chandler, NP 08/22/2023 7:30 PM

## 2023-08-22 NOTE — ED Notes (Signed)
Daughter at patient bedside.

## 2023-08-22 NOTE — ED Notes (Signed)
Notified MD of patient CBG level being 212. Patient recently had two orange juices and breakfast per previous nurse. MD stated to recheck once lunch trays officially arrive and give insulin sliding scale based on next recheck.

## 2023-08-22 NOTE — ED Notes (Signed)
Assisted pt with pericare and new brief/pants placed on patient.

## 2023-08-22 NOTE — ED Notes (Signed)
Patient received lunch tray at this time. States she is not hungry right now after just eating breakfast. States she needs to use the bathroom to have a BM at this time. Assisted patient to toilet. She requested privacy.

## 2023-08-22 NOTE — ED Notes (Signed)
Pt given PM snack at this time.  

## 2023-08-22 NOTE — ED Notes (Signed)
Sitting up in recliner, clean and dry, watching TV and eating breakfast. Denies further needs at this time.

## 2023-08-22 NOTE — ED Notes (Addendum)
MD notified of CBG recheck. Informed to give patient her sliding scale insulin at this time.

## 2023-08-22 NOTE — ED Provider Notes (Signed)
Emergency Medicine Observation Re-evaluation Note  [name] is a 89 y.o. female, seen on rounds today.  Pt initially presented to the ED for complaints of Fall and Facial Injury  Currently, the patient is resting comfortably.  Physical Exam  BP (!) 177/70 (BP Location: Right Arm)   Pulse 67   Temp 97.8 F (36.6 C) (Oral)   Resp 16   Ht 5\' 2"  (1.575 m)   Wt 59 kg   SpO2 100%   BMI 23.79 kg/m  General: No acute distress Cardiac: Well-perfused extremities Lungs: No respiratory distress Psych: Appropriate mood and affect  ED Course / MDM  EKG:EKG Interpretation Date/Time:  Saturday July 23 2023 05:39:42 EDT Ventricular Rate:  78 PR Interval:  48 QRS Duration:  106 QT Interval:  438 QTC Calculation: 499 R Axis:   61  Text Interpretation: Sinus rhythm Multiple premature complexes, vent & supraven Short PR interval Nonspecific T abnormalities, lateral leads Borderline prolonged QT interval Confirmed by UNCONFIRMED, DOCTOR ([id number]), editor Lonell Face ([id number]) on 07/25/2023 8:21:05 AM  I have reviewed the labs performed to date as well as medications administered while in observation.  Recent changes in the last 24 hours include none.  Plan  Current plan is for placement.   Merwyn Katos, MD 08/22/23 2147

## 2023-08-22 NOTE — ED Notes (Signed)
breakfast placed at side.

## 2023-08-23 ENCOUNTER — Encounter: Payer: Medicare Other | Admitting: Family

## 2023-08-23 DIAGNOSIS — I10 Essential (primary) hypertension: Secondary | ICD-10-CM | POA: Diagnosis not present

## 2023-08-23 DIAGNOSIS — R6889 Other general symptoms and signs: Secondary | ICD-10-CM | POA: Diagnosis not present

## 2023-08-23 DIAGNOSIS — M5030 Other cervical disc degeneration, unspecified cervical region: Secondary | ICD-10-CM | POA: Diagnosis not present

## 2023-08-23 DIAGNOSIS — Z7401 Bed confinement status: Secondary | ICD-10-CM | POA: Diagnosis not present

## 2023-08-23 DIAGNOSIS — Z743 Need for continuous supervision: Secondary | ICD-10-CM | POA: Diagnosis not present

## 2023-08-23 DIAGNOSIS — I480 Paroxysmal atrial fibrillation: Secondary | ICD-10-CM | POA: Diagnosis not present

## 2023-08-23 DIAGNOSIS — E119 Type 2 diabetes mellitus without complications: Secondary | ICD-10-CM | POA: Diagnosis not present

## 2023-08-23 DIAGNOSIS — R296 Repeated falls: Secondary | ICD-10-CM | POA: Diagnosis not present

## 2023-08-23 DIAGNOSIS — S0012XA Contusion of left eyelid and periocular area, initial encounter: Secondary | ICD-10-CM | POA: Diagnosis not present

## 2023-08-23 DIAGNOSIS — E785 Hyperlipidemia, unspecified: Secondary | ICD-10-CM | POA: Diagnosis not present

## 2023-08-23 DIAGNOSIS — I6782 Cerebral ischemia: Secondary | ICD-10-CM | POA: Diagnosis not present

## 2023-08-23 DIAGNOSIS — S022XXA Fracture of nasal bones, initial encounter for closed fracture: Secondary | ICD-10-CM | POA: Diagnosis not present

## 2023-08-23 DIAGNOSIS — R2681 Unsteadiness on feet: Secondary | ICD-10-CM | POA: Diagnosis not present

## 2023-08-23 DIAGNOSIS — S0011XA Contusion of right eyelid and periocular area, initial encounter: Secondary | ICD-10-CM | POA: Diagnosis not present

## 2023-08-23 DIAGNOSIS — S0083XA Contusion of other part of head, initial encounter: Secondary | ICD-10-CM | POA: Diagnosis not present

## 2023-08-23 DIAGNOSIS — M47812 Spondylosis without myelopathy or radiculopathy, cervical region: Secondary | ICD-10-CM | POA: Diagnosis not present

## 2023-08-23 DIAGNOSIS — M6281 Muscle weakness (generalized): Secondary | ICD-10-CM | POA: Diagnosis not present

## 2023-08-23 LAB — CBG MONITORING, ED
Glucose-Capillary: 110 mg/dL — ABNORMAL HIGH (ref 70–99)
Glucose-Capillary: 166 mg/dL — ABNORMAL HIGH (ref 70–99)
Glucose-Capillary: 129 mg/dL — ABNORMAL HIGH (ref 70–99)

## 2023-08-23 MED ORDER — HALOPERIDOL 5 MG PO TABS
5.0000 mg | ORAL_TABLET | Freq: Once | ORAL | Status: AC | PRN
  Administered 2023-08-23: 5 mg via ORAL
  Filled 2023-08-23: qty 1

## 2023-08-23 NOTE — ED Notes (Signed)
[facility]  CALLED FOR TRANSPORT TO  [facility]

## 2023-08-23 NOTE — Consult Note (Signed)
[facility] Face-to-Face Psychiatry Consult   Reason for Consult:  Behavioral changes in past 48 hours, possible medication adjustment Referring Physician:  Dr. Irean Hong Patient Identification: [name] MRN:  [id number] Principal Diagnosis: Dementia [facility]) Diagnosis:  Principal Problem:   Dementia (HCC)  Total Time spent with patient:  25 minutes  Subjective:  "good"  HPI:   Pt chart reviewed and assessed face to face. Pt initially sleeping, although wakes up when name is called. At times of assessment, alert and oriented x3, in no acute distress, non toxic appearing. Reports she is feeling "good" today. Feels she got good sleep. She denies suicidal, homicidal ideations. She denies auditory visual hallucinations or paranoia. No delusions or paranoia elicited today.   Past Psychiatric History: Depression  Risk to Self: NO Risk to Others: NO  Past Medical History:  Past Medical History:  Diagnosis Date   Anemia    Angina pectoris (HCC)    Atrial fibrillation (HCC)    Basal cell carcinoma 09/30/2022   dorsum nose - [facility]   Cervicalgia    CHF (congestive heart failure) (HCC)    Chronic back pain    Chronic kidney disease    Chronic obstructive pulmonary disease (COPD) (HCC)    COPD (chronic obstructive pulmonary disease) (HCC)    DDD (degenerative disc disease), lumbar    Diabetes mellitus without complication (HCC)    Dysphagia    Hyperlipemia    Hypertension    Squamous cell carcinoma of skin    left lateral pretibial   Vulvovaginitis     Past Surgical History:  Procedure Laterality Date   gall bladder     melanoma     removal neck and back   PERIPHERAL VASCULAR CATHETERIZATION Left 01/05/2016   Procedure: Lower Extremity Angiography;  Surgeon: Annice Needy, MD;  Location: [facility] INVASIVE CV LAB;  Service: Cardiovascular;  Laterality: Left;   PERIPHERAL VASCULAR CATHETERIZATION  01/05/2016   Procedure: Lower Extremity Intervention;  Surgeon: Annice Needy, MD;   Location: [facility] INVASIVE CV LAB;  Service: Cardiovascular;;   ureterolithiasis     calculus removed   VAGINAL HYSTERECTOMY     VISCERAL ANGIOGRAPHY N/A 05/10/2019   Procedure: VISCERAL ANGIOGRAPHY;  Surgeon: Annice Needy, MD;  Location: [facility] INVASIVE CV LAB;  Service: Cardiovascular;  Laterality: N/A;   VULVA / PERINEUM BIOPSY  05/29/2015   Family History:  Family History  Problem Relation Age of Onset   Diabetes Sister    Colon cancer Brother    Diabetes Brother    Atrial fibrillation Daughter    Ovarian cancer Other    Breast cancer Neg Hx    Family Psychiatric  History: None reported Social History:  Social History   Substance and Sexual Activity  Alcohol Use No     Social History   Substance and Sexual Activity  Drug Use No    Social History   Socioeconomic History   Marital status: Widowed    Spouse name: Not on file   Number of children: 3   Years of education: Not on file   Highest education level: 8th grade  Occupational History   Occupation: retired  Tobacco Use   Smoking status: Never    Passive exposure: Never   Smokeless tobacco: Never  Vaping Use   Vaping status: Never Used  Substance and Sexual Activity   Alcohol use: No   Drug use: No   Sexual activity: Never  Other Topics Concern   Not on file  Social History Narrative   Not on file   Social Determinants of Health   Financial Resource Strain: Low Risk  (05/19/2023)   Received from [facility]   Overall Financial Resource Strain (CARDIA)    Difficulty of Paying Living Expenses: Not hard at all  Food Insecurity: No Food Insecurity (05/19/2023)   Received from [facility]   Hunger Vital Sign    Worried About Running Out of Food in the Last Year: Never true    Ran Out of Food in the Last Year: Never true  Transportation Needs: No Transportation Needs (05/19/2023)   Received from [facility] - Transportation    In the past 12  months, has lack of transportation kept you from medical appointments or from getting medications?: No    Lack of Transportation (Non-Medical): No  Physical Activity: Inactive (05/19/2023)   Received from [facility]   Exercise Vital Sign    Days of Exercise per Week: 0 days    Minutes of Exercise per Session: 0 min  Stress: No Stress Concern Present (05/20/2022)   [employer] of Occupational Health - Occupational Stress Questionnaire    Feeling of Stress : Not at all  Social Connections: Moderately Isolated (05/20/2022)   Social Connection and Isolation Panel [NHANES]    Frequency of Communication with Friends and Family: More than three times a week    Frequency of Social Gatherings with Friends and Family: More than three times a week    Attends Religious Services: More than 4 times per year    Active Member of [employer] or Organizations: No    Attends Banker Meetings: Never    Marital Status: Widowed   Additional Social History:    Allergies:   Allergies  Allergen Reactions   Bacitracin-Neomycin-Polymyxin Rash   Neomycin-Bacitracin Zn-Polymyx Swelling and Rash   Latex Rash   Lidocaine Rash   Aricept [Donepezil Hcl] Diarrhea   Benzalkonium Chloride Itching and Swelling   Ibuprofen     Other reaction(s): Dizziness Heart fluttering. Tachycardia. Tachycardia.   Lidocaine Hcl Itching    Per pt, "all caine meds cause severe itching"   Valdecoxib Nausea And Vomiting   Albuterol Rash   Tape Rash    blisters   Triamcinolone Rash    Labs:  Results for orders placed or performed during the hospital encounter of 07/23/23 (from the past 48 hour(s))  CBG monitoring, ED     Status: Abnormal   Collection Time: 08/21/23 12:17 PM  Result Value Ref Range   Glucose-Capillary 214 (H) 70 - 99 mg/dL    Comment: Glucose reference range applies only to samples taken after fasting for at least 8 hours.  CBG monitoring, ED     Status: Abnormal   Collection  Time: 08/21/23  6:01 PM  Result Value Ref Range   Glucose-Capillary 127 (H) 70 - 99 mg/dL    Comment: Glucose reference range applies only to samples taken after fasting for at least 8 hours.  CBG monitoring, ED     Status: Abnormal   Collection Time: 08/21/23  9:56 PM  Result Value Ref Range   Glucose-Capillary 188 (H) 70 - 99 mg/dL    Comment: Glucose reference range applies only to samples taken after fasting for at least 8 hours.  CBG monitoring, ED     Status: None   Collection Time: 08/22/23  7:48 AM  Result Value Ref Range   Glucose-Capillary 76  70 - 99 mg/dL    Comment: Glucose reference range applies only to samples taken after fasting for at least 8 hours.  CBG monitoring, ED     Status: Abnormal   Collection Time: 08/22/23 11:14 AM  Result Value Ref Range   Glucose-Capillary 212 (H) 70 - 99 mg/dL    Comment: Glucose reference range applies only to samples taken after fasting for at least 8 hours.  CBG monitoring, ED     Status: Abnormal   Collection Time: 08/22/23 11:56 AM  Result Value Ref Range   Glucose-Capillary 241 (H) 70 - 99 mg/dL    Comment: Glucose reference range applies only to samples taken after fasting for at least 8 hours.  CBG monitoring, ED     Status: None   Collection Time: 08/22/23  4:40 PM  Result Value Ref Range   Glucose-Capillary 92 70 - 99 mg/dL    Comment: Glucose reference range applies only to samples taken after fasting for at least 8 hours.  CBG monitoring, ED     Status: Abnormal   Collection Time: 08/23/23  7:23 AM  Result Value Ref Range   Glucose-Capillary 110 (H) 70 - 99 mg/dL    Comment: Glucose reference range applies only to samples taken after fasting for at least 8 hours.    Current Facility-Administered Medications  Medication Dose Route Frequency Provider Last Rate Last Admin   aspirin EC tablet 325 mg  325 mg Oral Daily Merwyn Katos, MD   325 mg at 08/23/23 0937   atorvastatin (LIPITOR) tablet 10 mg  10 mg Oral Daily  Merwyn Katos, MD   10 mg at 08/23/23 0937   carvedilol (COREG) tablet 3.125 mg  3.125 mg Oral BID Merwyn Katos, MD   3.125 mg at 08/23/23 9528   clopidogrel (PLAVIX) tablet 75 mg  75 mg Oral Daily Merwyn Katos, MD   75 mg at 08/23/23 4132   Dupilumab SOAJ 300 mg  300 mg Subcutaneous Q14 Days Orson Aloe, RPH   300 mg at 07/27/23 1601   ezetimibe (ZETIA) tablet 10 mg  10 mg Oral Daily Merwyn Katos, MD   10 mg at 08/23/23 0937   FLUoxetine (PROZAC) capsule 10 mg  10 mg Oral TID Merwyn Katos, MD   10 mg at 08/23/23 4401   hydrocortisone (ANUSOL-HC) 2.5 % rectal cream   Rectal BID Sharyn Creamer, MD   1 Application at 08/22/23 2215   insulin aspart (novoLOG) injection 0-9 Units  0-9 Units Subcutaneous TID WC Merwyn Katos, MD   3 Units at 08/22/23 1202   insulin glargine-yfgn (SEMGLEE) injection 10 Units  10 Units Subcutaneous Daily Sharyn Creamer, MD   10 Units at 08/22/23 1047   isosorbide mononitrate (IMDUR) 24 hr tablet 30 mg  30 mg Oral Daily Merwyn Katos, MD   30 mg at 08/23/23 0937   loratadine (CLARITIN) tablet 10 mg  10 mg Oral Daily Merwyn Katos, MD   10 mg at 08/23/23 0937   memantine (NAMENDA) tablet 10 mg  10 mg Oral BID Merwyn Katos, MD   10 mg at 08/23/23 0937   mirabegron ER (MYRBETRIQ) tablet 25 mg  25 mg Oral Daily Merwyn Katos, MD   25 mg at 08/23/23 0938   pantoprazole (PROTONIX) EC tablet 20 mg  20 mg Oral Daily Merwyn Katos, MD   20 mg at 08/23/23 0938   QUEtiapine (SEROQUEL) tablet 200 mg  200 mg Oral QHS Lauree Chandler, NP   200 mg at 08/22/23 2204   QUEtiapine (SEROQUEL) tablet 50 mg  50 mg Oral Gwen Pounds, NP   50 mg at 08/23/23 2841   sacubitril-valsartan (ENTRESTO) 49-51 mg per tablet  1 tablet Oral BID Merwyn Katos, MD   1 tablet at 08/23/23 3244   Vitamin E CAPS 800 Units  800 Units Oral Daily Barrie Folk, [facility]   800 Units at 08/23/23 0102   Current Outpatient Medications  Medication Sig Dispense Refill    atorvastatin (LIPITOR) 10 MG tablet TAKE 1 TABLET BY MOUTH DAILY 90 tablet 0   carvedilol (COREG) 3.125 MG tablet Take 1 tablet (3.125 mg total) by mouth 2 (two) times daily. 180 tablet 3   clopidogrel (PLAVIX) 75 MG tablet Take 1 tablet (75 mg total) by mouth daily. 30 tablet 3   ENTRESTO 49-51 MG TAKE ONE TABLET BY MOUTH TWICE DAILY 60 tablet 5   ezetimibe (ZETIA) 10 MG tablet TAKE 1 TABLET BY MOUTH DAILY 90 tablet 1   FLUoxetine (PROZAC) 10 MG capsule TAKE 1 CAPSULE BY MOUTH 3 TIMES DAILY. 90 capsule 3   HUMALOG KWIKPEN 100 UNIT/ML KwikPen Inject 3 Units into the skin 3 (three) times daily. (Patient taking differently: Inject 12-15 Units into the skin 3 (three) times daily. 12 units with breakfast, 15 units with lunch, and 15 units with dinner) 15 mL 11   insulin glargine (LANTUS) 100 UNIT/ML injection Inject 0.06 mLs (6 Units total) into the skin at bedtime. (Patient taking differently: Inject 12 Units into the skin at bedtime.) 10 mL 11   isosorbide mononitrate (IMDUR) 30 MG 24 hr tablet Take 1 tablet (30 mg total) by mouth daily. Take 30 mg by mouth daily. 30 tablet 0   lansoprazole (PREVACID) 30 MG capsule TAKE 1 CAPSULE BY MOUTH ONCE DAILY AT NOON (Patient taking differently: Take 30 mg by mouth daily at 12 noon.) 90 capsule 1   memantine (NAMENDA) 10 MG tablet Take 10 mg by mouth 2 (two) times daily.     QUEtiapine (SEROQUEL) 50 MG tablet Take 50 mg by mouth at bedtime.     Vibegron (GEMTESA) 75 MG TABS Take 1 tablet (75 mg total) by mouth daily. 30 tablet 11   vitamin E 1000 UNIT capsule Take 1,000 Units by mouth daily.     aspirin EC 325 MG tablet Take 325 mg by mouth daily.     Dupilumab (DUPIXENT) 300 MG/2ML SOAJ Inject 300 mg into the skin every 14 (fourteen) days. Starting at day 15 for maintenance. Please schedule follow up 4 mL 1   fexofenadine (ALLEGRA) 180 MG tablet Take 180 mg by mouth daily.     glucose blood test strip ACCU-CHEK AVIVA PLUS TEST STRP Use to check sugars 3  times daily.     Multiple Vitamins-Minerals (ICAPS AREDS 2 PO) Take 2 capsules by mouth daily.      Musculoskeletal: Strength & Muscle Tone:  lying down on assessment Gait & Station:  lying down on assessment Patient leans:  lying down on assessment            Psychiatric Specialty Exam:  Presentation  General Appearance:  Appropriate for Environment  Eye Contact: Fair  Speech: Clear and Coherent; Normal Rate  Speech Volume: Normal  Handedness: Right   Mood and Affect  Mood: -- ("good")  Affect: Blunt   Thought Process  Thought Processes: Coherent  Descriptions of Associations:Intact  Orientation:Full (  Time, Place and Person)  Thought Content:Logical  History of Schizophrenia/Schizoaffective disorder:No  Duration of Psychotic Symptoms:Greater than six months  Hallucinations:Hallucinations: None  Ideas of Reference:None  Suicidal Thoughts:Suicidal Thoughts: No  Homicidal Thoughts:Homicidal Thoughts: No   Sensorium  Memory: Immediate Fair  Judgment: Intact  Insight: Shallow   Executive Functions  Concentration: Fair  Attention Span: Fair  Recall: Fair  Fund of Knowledge: Fair  Language: Fair   Psychomotor Activity  Psychomotor Activity: Psychomotor Activity: Decreased   Assets  Assets: Communication Skills; Desire for Improvement; Financial Resources/Insurance; Resilience; Social Support   Sleep  Sleep: Sleep: Good   Physical Exam: Physical Exam Constitutional:      General: She is not in acute distress.    Appearance: She is not ill-appearing, toxic-appearing or diaphoretic.  Cardiovascular:     Rate and Rhythm: Normal rate.  Pulmonary:     Effort: Pulmonary effort is normal. No respiratory distress.  Neurological:     Mental Status: She is alert and oriented to person, place, and time.  Psychiatric:        Attention and Perception: Attention and perception normal.        Mood and Affect: Mood  normal. Affect is blunt.        Speech: Speech normal.        Behavior: Behavior is slowed. Behavior is cooperative.        Thought Content: Thought content normal.    Review of Systems  Constitutional:  Negative for chills and fever.  Respiratory:  Negative for shortness of breath.   Cardiovascular:  Negative for chest pain and palpitations.  Gastrointestinal:  Negative for abdominal pain.  Neurological:  Negative for headaches.  Psychiatric/Behavioral: Negative.     Blood pressure (!) 135/58, pulse 69, temperature 97.8 F (36.6 C), temperature source Oral, resp. rate 16, height 5\' 2"  (1.575 m), weight 59 kg, SpO2 100%. Body mass index is 23.79 kg/m.  Treatment Plan Summary: Plan    EKG reviewed, QT/QTcB 404/448. Seroquel increased yesterday from 50mg  to 200mg  oral daily at bedtime and started seroquel 50mg  oral q 8am and 4pm. Pt reports tolerating well. Continue with medication changes.   Disposition: No evidence of imminent risk to self or others at present.   Patient does not meet criteria for psychiatric inpatient admission. Supportive therapy provided about ongoing stressors.  Lauree Chandler, NP 08/23/2023 9:47 AM

## 2023-08-23 NOTE — ED Provider Notes (Signed)
Emergency Medicine Observation Re-evaluation Note  [name] is a 89 y.o. female, awaiting TOC placement  Physical Exam  BP (!) 135/58   Pulse 69   Temp 97.8 F (36.6 C) (Oral)   Resp 16   Ht 5\' 2"  (1.575 m)   Wt 59 kg   SpO2 100%   BMI 23.79 kg/m  Physical Exam General: resting comfortably   ED Course / MDM   I have reviewed the labs performed in past 24 hours.  Plan  Current plan is for TOC placement.    Phineas Semen, MD 08/23/23 [phone]

## 2023-08-23 NOTE — ED Notes (Signed)
Report was given to EMS and facility. Pt stable at time of departure

## 2023-08-23 NOTE — [place] FL2 (Signed)
[place] MEDICAID FL2 LEVEL OF CARE FORM     IDENTIFICATION  Patient Name: [name] Birthdate: 07-Feb-1929 Sex: female Admission Date (Current Location): 07/23/2023  [place] and [place] Number:  Chiropodist and Address:  [facility], [address], [place], [place] [address]      Provider Number: [id number]  Attending Physician Name and Address:  No att. providers found  Relative Name and Phone Number:  Eliasen,James (Son)  [phone] [facility])    Current Level of Care: Hospital Recommended Level of Care: Memory Care Prior Approval Number:    Date Approved/Denied:   PASRR Number: [id number]  Discharge Plan: Other (Comment) ([facility])    Current Diagnoses: Patient Active Problem List   Diagnosis Date Noted   Hospital discharge follow-up 08/17/2022   Acute respiratory failure with hypoxia (HCC) 08/04/2022   Pressure injury of skin 07/31/2022   Acute on chronic combined systolic and diastolic CHF (congestive heart failure) (HCC) 07/30/2022   Elevated troponin 07/30/2022   Iron deficiency anemia 06/21/2022   Community acquired pneumonia    CHF (congestive heart failure) (HCC) 06/15/2022   Pain of left hip joint 03/08/2022   History of falling 07/14/2021   COPD (chronic obstructive pulmonary disease) (HCC) 07/14/2021   Neurocognitive disorder with Lewy bodies (CODE) (HCC) 07/07/2021   Atherosclerosis of native arteries of the extremities with ulceration (HCC) 04/07/2021   PSVT (paroxysmal supraventricular tachycardia) (HCC) 02/11/2021   Acute metabolic encephalopathy 02/10/2021   Frequent falls 02/10/2021   COVID-19 virus infection 02/10/2021   Chronic kidney disease, stage 3a (HCC) 02/10/2021   Dementia (HCC) 02/10/2021   Garbled speech, intermittent 02/10/2021   Laceration without foreign body, left ankle, subsequent encounter 02/10/2021   AMS (altered mental status) 02/10/2021   Osteoarthritis of  left shoulder 10/27/2020   Type 2 diabetes mellitus with diabetic peripheral angiopathy without gangrene (HCC) 09/27/2020   Personal history of malignant melanoma of skin 09/27/2020   Long term (current) use of insulin (HCC) 09/27/2020   Coronary artery disease involving native coronary artery of native heart 04/17/2020   Celiac artery stenosis (HCC) 06/12/2019   Bilateral lower abdominal cramping 04/20/2019   UTI symptoms 04/20/2019   History of rectal bleeding 03/26/2019   DM type 2 with diabetic peripheral neuropathy (HCC) 12/14/2018   Arthropathy of lumbar facet joint 09/04/2018   Degeneration of lumbar intervertebral disc 09/04/2018   Spondylolisthesis, grade 1 09/04/2018   Lewy body dementia (HCC) 02/17/2018   PVD (peripheral vascular disease) (HCC) 08/16/2017   Pain in limb 07/27/2016   Hallucinations 07/07/2016   Chest pain 07/01/2016   Depression 06/23/2016   Hypokalemia 09/03/2015   Insomnia 07/23/2015   Headache 07/23/2015   Type 2 diabetes mellitus with vascular disease (HCC) 06/24/2015   Chronic vulvitis 06/02/2015   Allergic rhinitis 05/30/2015   Anemia 05/30/2015   Back ache 05/30/2015   Body mass index (BMI) of 29.0-29.9 in adult 05/30/2015   Edema 05/30/2015   Dilatation of esophagus 05/30/2015   Fall 05/30/2015   H/O deep venous thrombosis 05/30/2015   Hypercholesteremia 05/30/2015   Heart & renal disease, hypertensive, with heart failure (HCC) 05/30/2015   Hyponatremia 05/30/2015   Calculus of kidney 05/30/2015   Gonalgia 05/30/2015   Psoriasis 05/30/2015   Avitaminosis D 05/30/2015   Cough 04/01/2015   Combined fat and carbohydrate induced hyperlipemia 12/30/2014   Paroxysmal atrial fibrillation (HCC) 07/10/2014   Abnormal gait 07/08/2014   Anxiety state 07/08/2014   Chronic kidney disease  07/08/2014   Acid reflux 07/08/2014   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HCC) 07/08/2014   COPD exacerbation (HCC) 07/08/2014   Type II diabetes mellitus with renal  manifestations (HCC) 07/08/2014   Benign essential HTN 06/26/2014   Carotid artery narrowing 06/26/2014   Myalgia 06/26/2014   Basal cell carcinoma of face 05/01/2014   Disease of female genital organs 11/28/2012   Incomplete bladder emptying 09/05/2012   Excessive urination at night 08/15/2012   Basal cell carcinoma of ear 10/26/2011    Orientation RESPIRATION BLADDER Height & Weight     Self, Time, Place  Normal Continent Weight: 130 lb 1.1 oz (59 kg) Height:  5\' 2"  (157.5 cm)  BEHAVIORAL SYMPTOMS/MOOD NEUROLOGICAL BOWEL NUTRITION STATUS        Diet (regular)  AMBULATORY STATUS COMMUNICATION OF NEEDS Skin   Limited Assist Verbally Normal                       Personal Care Assistance Level of Assistance  Bathing, Feeding, Dressing Bathing Assistance: Limited assistance Feeding assistance: Limited assistance Dressing Assistance: Limited assistance     Functional Limitations Info             SPECIAL CARE FACTORS FREQUENCY                       Contractures Contractures Info: Not present    Additional Factors Info  Code Status, Allergies Code Status Info: DNR Allergies Info: Bacitracin-neomycin-polymyxin, Neomycin-bacitracin Zn-polymyx, Latex, Lidocaine, Aricept (Donepezil Hcl), Benzalkonium Chloride, Ibuprofen, Lidocaine Hcl, Valdecoxib, Albuterol, Tape, Triamcinolone           Current Medications (08/23/2023):  This is the current hospital active medication list Current Facility-Administered Medications  Medication Dose Route Frequency Provider Last Rate Last Admin   aspirin EC tablet 325 mg  325 mg Oral Daily Merwyn Katos, MD   325 mg at 08/23/23 0937   atorvastatin (LIPITOR) tablet 10 mg  10 mg Oral Daily Merwyn Katos, MD   10 mg at 08/23/23 0937   carvedilol (COREG) tablet 3.125 mg  3.125 mg Oral BID Merwyn Katos, MD   3.125 mg at 08/23/23 2956   clopidogrel (PLAVIX) tablet 75 mg  75 mg Oral Daily Merwyn Katos, MD   75 mg at 08/23/23  2130   Dupilumab SOAJ 300 mg  300 mg Subcutaneous Q14 Days Orson Aloe, RPH   300 mg at 07/27/23 1601   ezetimibe (ZETIA) tablet 10 mg  10 mg Oral Daily Merwyn Katos, MD   10 mg at 08/23/23 0937   FLUoxetine (PROZAC) capsule 10 mg  10 mg Oral TID Merwyn Katos, MD   10 mg at 08/23/23 8657   hydrocortisone (ANUSOL-HC) 2.5 % rectal cream   Rectal BID Sharyn Creamer, MD   1 Application at 08/22/23 2215   insulin aspart (novoLOG) injection 0-9 Units  0-9 Units Subcutaneous TID WC Merwyn Katos, MD   2 Units at 08/23/23 1140   insulin glargine-yfgn (SEMGLEE) injection 10 Units  10 Units Subcutaneous Daily Sharyn Creamer, MD   10 Units at 08/23/23 1039   isosorbide mononitrate (IMDUR) 24 hr tablet 30 mg  30 mg Oral Daily Merwyn Katos, MD   30 mg at 08/23/23 0937   loratadine (CLARITIN) tablet 10 mg  10 mg Oral Daily Merwyn Katos, MD   10 mg at 08/23/23 0937   memantine (NAMENDA) tablet 10 mg  10 mg Oral BID  Merwyn Katos, MD   10 mg at 08/23/23 0937   mirabegron ER (MYRBETRIQ) tablet 25 mg  25 mg Oral Daily Merwyn Katos, MD   25 mg at 08/23/23 0938   pantoprazole (PROTONIX) EC tablet 20 mg  20 mg Oral Daily Merwyn Katos, MD   20 mg at 08/23/23 1610   QUEtiapine (SEROQUEL) tablet 200 mg  200 mg Oral QHS Lauree Chandler, NP   200 mg at 08/22/23 2204   QUEtiapine (SEROQUEL) tablet 50 mg  50 mg Oral Gwen Pounds, NP   50 mg at 08/23/23 9604   sacubitril-valsartan (ENTRESTO) 49-51 mg per tablet  1 tablet Oral BID Merwyn Katos, MD   1 tablet at 08/23/23 5409   Vitamin E CAPS 800 Units  800 Units Oral Daily Barrie Folk, [facility]   800 Units at 08/23/23 8119   Current Outpatient Medications  Medication Sig Dispense Refill   atorvastatin (LIPITOR) 10 MG tablet TAKE 1 TABLET BY MOUTH DAILY 90 tablet 0   carvedilol (COREG) 3.125 MG tablet Take 1 tablet (3.125 mg total) by mouth 2 (two) times daily. 180 tablet 3   clopidogrel (PLAVIX) 75 MG tablet Take 1 tablet (75  mg total) by mouth daily. 30 tablet 3   ENTRESTO 49-51 MG TAKE ONE TABLET BY MOUTH TWICE DAILY 60 tablet 5   ezetimibe (ZETIA) 10 MG tablet TAKE 1 TABLET BY MOUTH DAILY 90 tablet 1   FLUoxetine (PROZAC) 10 MG capsule TAKE 1 CAPSULE BY MOUTH 3 TIMES DAILY. 90 capsule 3   HUMALOG KWIKPEN 100 UNIT/ML KwikPen Inject 3 Units into the skin 3 (three) times daily. (Patient taking differently: Inject 12-15 Units into the skin 3 (three) times daily. 12 units with breakfast, 15 units with lunch, and 15 units with dinner) 15 mL 11   insulin glargine (LANTUS) 100 UNIT/ML injection Inject 0.06 mLs (6 Units total) into the skin at bedtime. (Patient taking differently: Inject 12 Units into the skin at bedtime.) 10 mL 11   isosorbide mononitrate (IMDUR) 30 MG 24 hr tablet Take 1 tablet (30 mg total) by mouth daily. Take 30 mg by mouth daily. 30 tablet 0   lansoprazole (PREVACID) 30 MG capsule TAKE 1 CAPSULE BY MOUTH ONCE DAILY AT NOON (Patient taking differently: Take 30 mg by mouth daily at 12 noon.) 90 capsule 1   memantine (NAMENDA) 10 MG tablet Take 10 mg by mouth 2 (two) times daily.     QUEtiapine (SEROQUEL) 50 MG tablet Take 50 mg by mouth at bedtime.     Vibegron (GEMTESA) 75 MG TABS Take 1 tablet (75 mg total) by mouth daily. 30 tablet 11   vitamin E 1000 UNIT capsule Take 1,000 Units by mouth daily.     aspirin EC 325 MG tablet Take 325 mg by mouth daily.     Dupilumab (DUPIXENT) 300 MG/2ML SOAJ Inject 300 mg into the skin every 14 (fourteen) days. Starting at day 15 for maintenance. Please schedule follow up 4 mL 1   fexofenadine (ALLEGRA) 180 MG tablet Take 180 mg by mouth daily.     glucose blood test strip ACCU-CHEK AVIVA PLUS TEST STRP Use to check sugars 3 times daily.     Multiple Vitamins-Minerals (ICAPS AREDS 2 PO) Take 2 capsules by mouth daily.       Discharge Medications: Please see discharge summary for a list of discharge medications.  Relevant Imaging Results:  Relevant Lab  Results:   Additional Information  SS #: [id number]  Marquita Palms, LCSW

## 2023-08-23 NOTE — ED Notes (Signed)
Pt eating at this time.

## 2023-08-23 NOTE — ED Provider Notes (Signed)
Patient care has been arranged with social work and she has been cleared for discharge with outpatient follow-up and social work/physical therapy follow-up.  Family is in agreement this plan.  No new medications ordered.   Shaune Pollack, MD 08/23/23 1345

## 2023-08-23 NOTE — [place] FL2 (Signed)
[place] MEDICAID FL2 LEVEL OF CARE FORM     IDENTIFICATION  Patient Name: [name] Birthdate: [birth year] Sex: female Admission Date (Current Location): 07/23/2023  [place] and [place] Number:  Chiropodist and Address:  [facility], [address] [place], [place] [address]      Provider Number: [id number]  Attending Physician Name and Address:  No att. providers found  Relative Name and Phone Number:  Dehoyos,James (Son)  [phone] [facility])    Current Level of Care: Hospital Recommended Level of Care: Memory Care Prior Approval Number:    Date Approved/Denied:   PASRR Number: [id number]  Discharge Plan: Memory Care    Current Diagnoses: Patient Active Problem List   Diagnosis Date Noted   Hospital discharge follow-up 08/17/2022   Acute respiratory failure with hypoxia (HCC) 08/04/2022   Pressure injury of skin 07/31/2022   Acute on chronic combined systolic and diastolic CHF (congestive heart failure) (HCC) 07/30/2022   Elevated troponin 07/30/2022   Iron deficiency anemia 06/21/2022   Community acquired pneumonia    CHF (congestive heart failure) (HCC) 06/15/2022   Pain of left hip joint 03/08/2022   History of falling 07/14/2021   COPD (chronic obstructive pulmonary disease) (HCC) 07/14/2021   Neurocognitive disorder with Lewy bodies (CODE) (HCC) 07/07/2021   Atherosclerosis of native arteries of the extremities with ulceration (HCC) 04/07/2021   PSVT (paroxysmal supraventricular tachycardia) (HCC) 02/11/2021   Acute metabolic encephalopathy 02/10/2021   Frequent falls 02/10/2021   COVID-19 virus infection 02/10/2021   Chronic kidney disease, stage 3a (HCC) 02/10/2021   Dementia (HCC) 02/10/2021   Garbled speech, intermittent 02/10/2021   Laceration without foreign body, left ankle, subsequent encounter 02/10/2021   AMS (altered mental status) 02/10/2021   Osteoarthritis of left shoulder 10/27/2020    Type 2 diabetes mellitus with diabetic peripheral angiopathy without gangrene (HCC) 09/27/2020   Personal history of malignant melanoma of skin 09/27/2020   Long term (current) use of insulin (HCC) 09/27/2020   Coronary artery disease involving native coronary artery of native heart 04/17/2020   Celiac artery stenosis (HCC) 06/12/2019   Bilateral lower abdominal cramping 04/20/2019   UTI symptoms 04/20/2019   History of rectal bleeding 03/26/2019   DM type 2 with diabetic peripheral neuropathy (HCC) 12/14/2018   Arthropathy of lumbar facet joint 09/04/2018   Degeneration of lumbar intervertebral disc 09/04/2018   Spondylolisthesis, grade 1 09/04/2018   Lewy body dementia (HCC) 02/17/2018   PVD (peripheral vascular disease) (HCC) 08/16/2017   Pain in limb 07/27/2016   Hallucinations 07/07/2016   Chest pain 07/01/2016   Depression 06/23/2016   Hypokalemia 09/03/2015   Insomnia 07/23/2015   Headache 07/23/2015   Type 2 diabetes mellitus with vascular disease (HCC) 06/24/2015   Chronic vulvitis 06/02/2015   Allergic rhinitis 05/30/2015   Anemia 05/30/2015   Back ache 05/30/2015   Body mass index (BMI) of 29.0-29.9 in adult 05/30/2015   Edema 05/30/2015   Dilatation of esophagus 05/30/2015   Fall 05/30/2015   H/O deep venous thrombosis 05/30/2015   Hypercholesteremia 05/30/2015   Heart & renal disease, hypertensive, with heart failure (HCC) 05/30/2015   Hyponatremia 05/30/2015   Calculus of kidney 05/30/2015   Gonalgia 05/30/2015   Psoriasis 05/30/2015   Avitaminosis D 05/30/2015   Cough 04/01/2015   Combined fat and carbohydrate induced hyperlipemia 12/30/2014   Paroxysmal atrial fibrillation (HCC) 07/10/2014   Abnormal gait 07/08/2014   Anxiety state 07/08/2014   Chronic kidney disease 07/08/2014   Acid  reflux 07/08/2014   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HCC) 07/08/2014   COPD exacerbation (HCC) 07/08/2014   Type II diabetes mellitus with renal manifestations (HCC)  07/08/2014   Benign essential HTN 06/26/2014   Carotid artery narrowing 06/26/2014   Myalgia 06/26/2014   Basal cell carcinoma of face 05/01/2014   Disease of female genital organs 11/28/2012   Incomplete bladder emptying 09/05/2012   Excessive urination at night 08/15/2012   Basal cell carcinoma of ear 10/26/2011    Orientation RESPIRATION BLADDER Height & Weight     Self, Time, Place  Normal Continent Weight: 130 lb 1.1 oz (59 kg) Height:  5\' 2"  (157.5 cm)  BEHAVIORAL SYMPTOMS/MOOD NEUROLOGICAL BOWEL NUTRITION STATUS        Diet (regular)  AMBULATORY STATUS COMMUNICATION OF NEEDS Skin   Limited Assist Verbally Normal                       Personal Care Assistance Level of Assistance  Bathing, Feeding, Dressing Bathing Assistance: Limited assistance Feeding assistance: Limited assistance Dressing Assistance: Limited assistance     Functional Limitations Info             SPECIAL CARE FACTORS FREQUENCY                       Contractures Contractures Info: Not present    Additional Factors Info  Code Status, Allergies Code Status Info: DNR Allergies Info: Bacitracin-neomycin-polymyxin, Neomycin-bacitracin Zn-polymyx, Latex, Lidocaine, Aricept (Donepezil Hcl), Benzalkonium Chloride, Ibuprofen, Lidocaine Hcl, Valdecoxib, Albuterol, Tape, Triamcinolone           Current Medications (08/23/2023):  This is the current hospital active medication list Current Facility-Administered Medications  Medication Dose Route Frequency Provider Last Rate Last Admin   aspirin EC tablet 325 mg  325 mg Oral Daily Merwyn Katos, MD   325 mg at 08/23/23 0937   atorvastatin (LIPITOR) tablet 10 mg  10 mg Oral Daily Merwyn Katos, MD   10 mg at 08/23/23 0937   carvedilol (COREG) tablet 3.125 mg  3.125 mg Oral BID Merwyn Katos, MD   3.125 mg at 08/23/23 1610   clopidogrel (PLAVIX) tablet 75 mg  75 mg Oral Daily Merwyn Katos, MD   75 mg at 08/23/23 9604   Dupilumab  SOAJ 300 mg  300 mg Subcutaneous Q14 Days Orson Aloe, RPH   300 mg at 07/27/23 1601   ezetimibe (ZETIA) tablet 10 mg  10 mg Oral Daily Merwyn Katos, MD   10 mg at 08/23/23 0937   FLUoxetine (PROZAC) capsule 10 mg  10 mg Oral TID Merwyn Katos, MD   10 mg at 08/23/23 5409   hydrocortisone (ANUSOL-HC) 2.5 % rectal cream   Rectal BID Sharyn Creamer, MD   1 Application at 08/22/23 2215   insulin aspart (novoLOG) injection 0-9 Units  0-9 Units Subcutaneous TID WC Merwyn Katos, MD   2 Units at 08/23/23 1140   insulin glargine-yfgn (SEMGLEE) injection 10 Units  10 Units Subcutaneous Daily Sharyn Creamer, MD   10 Units at 08/23/23 1039   isosorbide mononitrate (IMDUR) 24 hr tablet 30 mg  30 mg Oral Daily Merwyn Katos, MD   30 mg at 08/23/23 0937   loratadine (CLARITIN) tablet 10 mg  10 mg Oral Daily Merwyn Katos, MD   10 mg at 08/23/23 0937   memantine (NAMENDA) tablet 10 mg  10 mg Oral BID Merwyn Katos, MD  10 mg at 08/23/23 0937   mirabegron ER (MYRBETRIQ) tablet 25 mg  25 mg Oral Daily Merwyn Katos, MD   25 mg at 08/23/23 0938   pantoprazole (PROTONIX) EC tablet 20 mg  20 mg Oral Daily Merwyn Katos, MD   20 mg at 08/23/23 8657   QUEtiapine (SEROQUEL) tablet 200 mg  200 mg Oral QHS Lauree Chandler, NP   200 mg at 08/22/23 2204   QUEtiapine (SEROQUEL) tablet 50 mg  50 mg Oral Gwen Pounds, NP   50 mg at 08/23/23 8469   sacubitril-valsartan (ENTRESTO) 49-51 mg per tablet  1 tablet Oral BID Merwyn Katos, MD   1 tablet at 08/23/23 6295   Vitamin E CAPS 800 Units  800 Units Oral Daily Barrie Folk, [facility]   800 Units at 08/23/23 2841   Current Outpatient Medications  Medication Sig Dispense Refill   atorvastatin (LIPITOR) 10 MG tablet TAKE 1 TABLET BY MOUTH DAILY 90 tablet 0   carvedilol (COREG) 3.125 MG tablet Take 1 tablet (3.125 mg total) by mouth 2 (two) times daily. 180 tablet 3   clopidogrel (PLAVIX) 75 MG tablet Take 1 tablet (75 mg total) by  mouth daily. 30 tablet 3   ENTRESTO 49-51 MG TAKE ONE TABLET BY MOUTH TWICE DAILY 60 tablet 5   ezetimibe (ZETIA) 10 MG tablet TAKE 1 TABLET BY MOUTH DAILY 90 tablet 1   FLUoxetine (PROZAC) 10 MG capsule TAKE 1 CAPSULE BY MOUTH 3 TIMES DAILY. 90 capsule 3   HUMALOG KWIKPEN 100 UNIT/ML KwikPen Inject 3 Units into the skin 3 (three) times daily. (Patient taking differently: Inject 12-15 Units into the skin 3 (three) times daily. 12 units with breakfast, 15 units with lunch, and 15 units with dinner) 15 mL 11   insulin glargine (LANTUS) 100 UNIT/ML injection Inject 0.06 mLs (6 Units total) into the skin at bedtime. (Patient taking differently: Inject 12 Units into the skin at bedtime.) 10 mL 11   isosorbide mononitrate (IMDUR) 30 MG 24 hr tablet Take 1 tablet (30 mg total) by mouth daily. Take 30 mg by mouth daily. 30 tablet 0   lansoprazole (PREVACID) 30 MG capsule TAKE 1 CAPSULE BY MOUTH ONCE DAILY AT NOON (Patient taking differently: Take 30 mg by mouth daily at 12 noon.) 90 capsule 1   memantine (NAMENDA) 10 MG tablet Take 10 mg by mouth 2 (two) times daily.     QUEtiapine (SEROQUEL) 50 MG tablet Take 50 mg by mouth at bedtime.     Vibegron (GEMTESA) 75 MG TABS Take 1 tablet (75 mg total) by mouth daily. 30 tablet 11   vitamin E 1000 UNIT capsule Take 1,000 Units by mouth daily.     aspirin EC 325 MG tablet Take 325 mg by mouth daily.     Dupilumab (DUPIXENT) 300 MG/2ML SOAJ Inject 300 mg into the skin every 14 (fourteen) days. Starting at day 15 for maintenance. Please schedule follow up 4 mL 1   fexofenadine (ALLEGRA) 180 MG tablet Take 180 mg by mouth daily.     glucose blood test strip ACCU-CHEK AVIVA PLUS TEST STRP Use to check sugars 3 times daily.     Multiple Vitamins-Minerals (ICAPS AREDS 2 PO) Take 2 capsules by mouth daily.       Discharge Medications: Please see the After Visit summary for a list of discharge medications.  Relevant Imaging Results:  Relevant Lab  Results:   Additional Information SS #: [id number]  Marquita Palms, LCSW

## 2023-08-23 NOTE — ED Notes (Signed)
Received report from previous nurse.  Patient is currently sleeping.  No assessment possible at this time.

## 2023-08-23 NOTE — ED Notes (Signed)
Meal tray delivered.

## 2023-08-23 NOTE — ED Notes (Signed)
Attempted to call report to facility without success

## 2023-08-23 NOTE — [place] FL2 (Signed)
MEDICAID FL2 LEVEL OF CARE FORM     IDENTIFICATION  Patient Name: [name] Birthdate: 05/26/1929 Sex: female Admission Date (Current Location): 07/23/2023  [place] Number:  Chiropodist and Address:  [facility], [address], [place], [place] [address]      Provider Number: [id number]  Attending Physician Name and Address:  No att. providers found  Relative Name and Phone Number:  Gohlke,James (Son)  [phone] [facility])    Current Level of Care: Hospital Recommended Level of Care: Memory Care Prior Approval Number:    Date Approved/Denied:   PASRR Number: [id number]  Discharge Plan: Other (Comment) ([facility])    Current Diagnoses: Patient Active Problem List   Diagnosis Date Noted   Hospital discharge follow-up 08/17/2022   Acute respiratory failure with hypoxia (HCC) 08/04/2022   Pressure injury of skin 07/31/2022   Acute on chronic combined systolic and diastolic CHF (congestive heart failure) (HCC) 07/30/2022   Elevated troponin 07/30/2022   Iron deficiency anemia 06/21/2022   Community acquired pneumonia    CHF (congestive heart failure) (HCC) 06/15/2022   Pain of left hip joint 03/08/2022   History of falling 07/14/2021   COPD (chronic obstructive pulmonary disease) (HCC) 07/14/2021   Neurocognitive disorder with Lewy bodies (CODE) (HCC) 07/07/2021   Atherosclerosis of native arteries of the extremities with ulceration (HCC) 04/07/2021   PSVT (paroxysmal supraventricular tachycardia) (HCC) 02/11/2021   Acute metabolic encephalopathy 02/10/2021   Frequent falls 02/10/2021   COVID-19 virus infection 02/10/2021   Chronic kidney disease, stage 3a (HCC) 02/10/2021   Dementia (HCC) 02/10/2021   Garbled speech, intermittent 02/10/2021   Laceration without foreign body, left ankle, subsequent encounter 02/10/2021   AMS (altered mental status) 02/10/2021   Osteoarthritis of  left shoulder 10/27/2020   Type 2 diabetes mellitus with diabetic peripheral angiopathy without gangrene (HCC) 09/27/2020   Personal history of malignant melanoma of skin 09/27/2020   Long term (current) use of insulin (HCC) 09/27/2020   Coronary artery disease involving native coronary artery of native heart 04/17/2020   Celiac artery stenosis (HCC) 06/12/2019   Bilateral lower abdominal cramping 04/20/2019   UTI symptoms 04/20/2019   History of rectal bleeding 03/26/2019   DM type 2 with diabetic peripheral neuropathy (HCC) 12/14/2018   Arthropathy of lumbar facet joint 09/04/2018   Degeneration of lumbar intervertebral disc 09/04/2018   Spondylolisthesis, grade 1 09/04/2018   Lewy body dementia (HCC) 02/17/2018   PVD (peripheral vascular disease) (HCC) 08/16/2017   Pain in limb 07/27/2016   Hallucinations 07/07/2016   Chest pain 07/01/2016   Depression 06/23/2016   Hypokalemia 09/03/2015   Insomnia 07/23/2015   Headache 07/23/2015   Type 2 diabetes mellitus with vascular disease (HCC) 06/24/2015   Chronic vulvitis 06/02/2015   Allergic rhinitis 05/30/2015   Anemia 05/30/2015   Back ache 05/30/2015   Body mass index (BMI) of 29.0-29.9 in adult 05/30/2015   Edema 05/30/2015   Dilatation of esophagus 05/30/2015   Fall 05/30/2015   H/O deep venous thrombosis 05/30/2015   Hypercholesteremia 05/30/2015   Heart & renal disease, hypertensive, with heart failure (HCC) 05/30/2015   Hyponatremia 05/30/2015   Calculus of kidney 05/30/2015   Gonalgia 05/30/2015   Psoriasis 05/30/2015   Avitaminosis D 05/30/2015   Cough 04/01/2015   Combined fat and carbohydrate induced hyperlipemia 12/30/2014   Paroxysmal atrial fibrillation (HCC) 07/10/2014   Abnormal gait 07/08/2014   Anxiety state 07/08/2014   Chronic kidney disease  07/08/2014   Acid reflux 07/08/2014   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HCC) 07/08/2014   COPD exacerbation (HCC) 07/08/2014   Type II diabetes mellitus with renal  manifestations (HCC) 07/08/2014   Benign essential HTN 06/26/2014   Carotid artery narrowing 06/26/2014   Myalgia 06/26/2014   Basal cell carcinoma of face 05/01/2014   Disease of female genital organs 11/28/2012   Incomplete bladder emptying 09/05/2012   Excessive urination at night 08/15/2012   Basal cell carcinoma of ear 10/26/2011    Orientation RESPIRATION BLADDER Height & Weight     Self, Time, Place  Normal Continent Weight: 130 lb 1.1 oz (59 kg) Height:  5\' 2"  (157.5 cm)  BEHAVIORAL SYMPTOMS/MOOD NEUROLOGICAL BOWEL NUTRITION STATUS        Diet (regular)  AMBULATORY STATUS COMMUNICATION OF NEEDS Skin   Limited Assist Verbally Normal                       Personal Care Assistance Level of Assistance  Bathing, Feeding, Dressing Bathing Assistance: Limited assistance Feeding assistance: Limited assistance Dressing Assistance: Limited assistance     Functional Limitations Info             SPECIAL CARE FACTORS FREQUENCY                       Contractures Contractures Info: Not present    Additional Factors Info  Code Status, Allergies Code Status Info: DNR Allergies Info: Bacitracin-neomycin-polymyxin, Neomycin-bacitracin Zn-polymyx, Latex, Lidocaine, Aricept (Donepezil Hcl), Benzalkonium Chloride, Ibuprofen, Lidocaine Hcl, Valdecoxib, Albuterol, Tape, Triamcinolone           Current Medications (08/23/2023):  This is the current hospital active medication list Current Facility-Administered Medications  Medication Dose Route Frequency Provider Last Rate Last Admin   aspirin EC tablet 325 mg  325 mg Oral Daily Merwyn Katos, MD   325 mg at 08/23/23 0937   atorvastatin (LIPITOR) tablet 10 mg  10 mg Oral Daily Merwyn Katos, MD   10 mg at 08/23/23 0937   carvedilol (COREG) tablet 3.125 mg  3.125 mg Oral BID Merwyn Katos, MD   3.125 mg at 08/23/23 7253   clopidogrel (PLAVIX) tablet 75 mg  75 mg Oral Daily Merwyn Katos, MD   75 mg at 08/23/23  6644   Dupilumab SOAJ 300 mg  300 mg Subcutaneous Q14 Days Orson Aloe, RPH   300 mg at 07/27/23 1601   ezetimibe (ZETIA) tablet 10 mg  10 mg Oral Daily Merwyn Katos, MD   10 mg at 08/23/23 0937   FLUoxetine (PROZAC) capsule 10 mg  10 mg Oral TID Merwyn Katos, MD   10 mg at 08/23/23 0347   hydrocortisone (ANUSOL-HC) 2.5 % rectal cream   Rectal BID Sharyn Creamer, MD   1 Application at 08/22/23 2215   insulin aspart (novoLOG) injection 0-9 Units  0-9 Units Subcutaneous TID WC Merwyn Katos, MD   2 Units at 08/23/23 1140   insulin glargine-yfgn (SEMGLEE) injection 10 Units  10 Units Subcutaneous Daily Sharyn Creamer, MD   10 Units at 08/23/23 1039   isosorbide mononitrate (IMDUR) 24 hr tablet 30 mg  30 mg Oral Daily Merwyn Katos, MD   30 mg at 08/23/23 0937   loratadine (CLARITIN) tablet 10 mg  10 mg Oral Daily Merwyn Katos, MD   10 mg at 08/23/23 0937   memantine (NAMENDA) tablet 10 mg  10 mg Oral BID  Merwyn Katos, MD   10 mg at 08/23/23 0937   mirabegron ER (MYRBETRIQ) tablet 25 mg  25 mg Oral Daily Merwyn Katos, MD   25 mg at 08/23/23 0938   pantoprazole (PROTONIX) EC tablet 20 mg  20 mg Oral Daily Merwyn Katos, MD   20 mg at 08/23/23 1610   QUEtiapine (SEROQUEL) tablet 200 mg  200 mg Oral QHS Lauree Chandler, NP   200 mg at 08/22/23 2204   QUEtiapine (SEROQUEL) tablet 50 mg  50 mg Oral Gwen Pounds, NP   50 mg at 08/23/23 9604   sacubitril-valsartan (ENTRESTO) 49-51 mg per tablet  1 tablet Oral BID Merwyn Katos, MD   1 tablet at 08/23/23 5409   Vitamin E CAPS 800 Units  800 Units Oral Daily Barrie Folk, [facility]   800 Units at 08/23/23 8119   Current Outpatient Medications  Medication Sig Dispense Refill   atorvastatin (LIPITOR) 10 MG tablet TAKE 1 TABLET BY MOUTH DAILY 90 tablet 0   carvedilol (COREG) 3.125 MG tablet Take 1 tablet (3.125 mg total) by mouth 2 (two) times daily. 180 tablet 3   clopidogrel (PLAVIX) 75 MG tablet Take 1 tablet (75  mg total) by mouth daily. 30 tablet 3   ENTRESTO 49-51 MG TAKE ONE TABLET BY MOUTH TWICE DAILY 60 tablet 5   ezetimibe (ZETIA) 10 MG tablet TAKE 1 TABLET BY MOUTH DAILY 90 tablet 1   FLUoxetine (PROZAC) 10 MG capsule TAKE 1 CAPSULE BY MOUTH 3 TIMES DAILY. 90 capsule 3   HUMALOG KWIKPEN 100 UNIT/ML KwikPen Inject 3 Units into the skin 3 (three) times daily. (Patient taking differently: Inject 12-15 Units into the skin 3 (three) times daily. 12 units with breakfast, 15 units with lunch, and 15 units with dinner) 15 mL 11   insulin glargine (LANTUS) 100 UNIT/ML injection Inject 0.06 mLs (6 Units total) into the skin at bedtime. (Patient taking differently: Inject 12 Units into the skin at bedtime.) 10 mL 11   isosorbide mononitrate (IMDUR) 30 MG 24 hr tablet Take 1 tablet (30 mg total) by mouth daily. Take 30 mg by mouth daily. 30 tablet 0   lansoprazole (PREVACID) 30 MG capsule TAKE 1 CAPSULE BY MOUTH ONCE DAILY AT NOON (Patient taking differently: Take 30 mg by mouth daily at 12 noon.) 90 capsule 1   memantine (NAMENDA) 10 MG tablet Take 10 mg by mouth 2 (two) times daily.     QUEtiapine (SEROQUEL) 50 MG tablet Take 50 mg by mouth at bedtime.     Vibegron (GEMTESA) 75 MG TABS Take 1 tablet (75 mg total) by mouth daily. 30 tablet 11   vitamin E 1000 UNIT capsule Take 1,000 Units by mouth daily.     aspirin EC 325 MG tablet Take 325 mg by mouth daily.     Dupilumab (DUPIXENT) 300 MG/2ML SOAJ Inject 300 mg into the skin every 14 (fourteen) days. Starting at day 15 for maintenance. Please schedule follow up 4 mL 1   fexofenadine (ALLEGRA) 180 MG tablet Take 180 mg by mouth daily.     glucose blood test strip ACCU-CHEK AVIVA PLUS TEST STRP Use to check sugars 3 times daily.     Multiple Vitamins-Minerals (ICAPS AREDS 2 PO) Take 2 capsules by mouth daily.       Discharge Medications: Please see discharge summary for a list of discharge medications.  Relevant Imaging Results:  Relevant Lab  Results:   Additional Information  SS #: [id number]  Marquita Palms, LCSW

## 2023-08-23 NOTE — ED Notes (Signed)
Gave report to [place] at [facility]

## 2023-08-23 NOTE — ED Notes (Signed)
Pt sitting in doorway of room.

## 2023-08-23 NOTE — ED Notes (Signed)
Pt resting in recliner at this time. Chest rise and fall noted.

## 2023-08-24 DIAGNOSIS — M6281 Muscle weakness (generalized): Secondary | ICD-10-CM | POA: Diagnosis not present

## 2023-08-24 DIAGNOSIS — I10 Essential (primary) hypertension: Secondary | ICD-10-CM | POA: Diagnosis not present

## 2023-08-24 DIAGNOSIS — S0083XD Contusion of other part of head, subsequent encounter: Secondary | ICD-10-CM | POA: Diagnosis not present

## 2023-08-24 DIAGNOSIS — E782 Mixed hyperlipidemia: Secondary | ICD-10-CM | POA: Diagnosis not present

## 2023-08-24 DIAGNOSIS — J309 Allergic rhinitis, unspecified: Secondary | ICD-10-CM | POA: Diagnosis not present

## 2023-08-24 DIAGNOSIS — R296 Repeated falls: Secondary | ICD-10-CM | POA: Diagnosis not present

## 2023-08-24 DIAGNOSIS — S022XXD Fracture of nasal bones, subsequent encounter for fracture with routine healing: Secondary | ICD-10-CM | POA: Diagnosis not present

## 2023-08-24 DIAGNOSIS — K648 Other hemorrhoids: Secondary | ICD-10-CM | POA: Diagnosis not present

## 2023-08-24 DIAGNOSIS — J449 Chronic obstructive pulmonary disease, unspecified: Secondary | ICD-10-CM | POA: Diagnosis not present

## 2023-08-24 DIAGNOSIS — K219 Gastro-esophageal reflux disease without esophagitis: Secondary | ICD-10-CM | POA: Diagnosis not present

## 2023-08-25 DIAGNOSIS — M1612 Unilateral primary osteoarthritis, left hip: Secondary | ICD-10-CM | POA: Diagnosis not present

## 2023-08-25 DIAGNOSIS — M533 Sacrococcygeal disorders, not elsewhere classified: Secondary | ICD-10-CM | POA: Diagnosis not present

## 2023-08-25 DIAGNOSIS — G8911 Acute pain due to trauma: Secondary | ICD-10-CM | POA: Diagnosis not present

## 2023-08-25 DIAGNOSIS — M47816 Spondylosis without myelopathy or radiculopathy, lumbar region: Secondary | ICD-10-CM | POA: Diagnosis not present

## 2023-08-25 DIAGNOSIS — M1611 Unilateral primary osteoarthritis, right hip: Secondary | ICD-10-CM | POA: Diagnosis not present

## 2023-08-25 DIAGNOSIS — M6281 Muscle weakness (generalized): Secondary | ICD-10-CM | POA: Diagnosis not present

## 2023-08-26 DIAGNOSIS — M6281 Muscle weakness (generalized): Secondary | ICD-10-CM | POA: Diagnosis not present

## 2023-11-01 ENCOUNTER — Telehealth: Payer: Self-pay | Admitting: Oncology

## 2023-11-01 ENCOUNTER — Inpatient Hospital Stay: Payer: Medicare Other

## 2023-11-01 ENCOUNTER — Inpatient Hospital Stay: Payer: Medicare Other | Admitting: Oncology

## 2023-11-01 NOTE — Telephone Encounter (Signed)
Called to make sure patient was okay because she missed her appointment, according to her son, she is now in a nursing home in [place], [place]. He said they are handling everything over there and asked me to cancel the appointment.

## 2023-12-08 IMAGING — MR MR LUMBAR SPINE W/O CM
5 series · 32 of 48 positions shown · non-contrast
Comparison: 10/16/2013

CLINICAL DATA: Low back pain.  Spondyloarthropathy suspected.

EXAM:
MRI LUMBAR SPINE WITHOUT CONTRAST
TECHNIQUE: Multiplanar, multisequence MR imaging of the lumbar spine was
performed. No intravenous contrast was administered.

[Series 5: T2 · sagittal · 4.0mm · 0.81mm/px · 6 of 17 slices shown (1 of 2)]
[im 1/17]
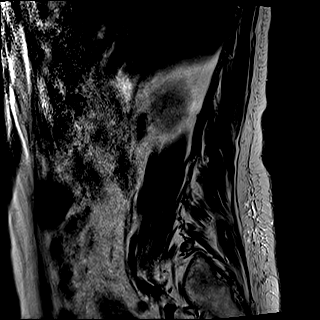
[im 4/17]
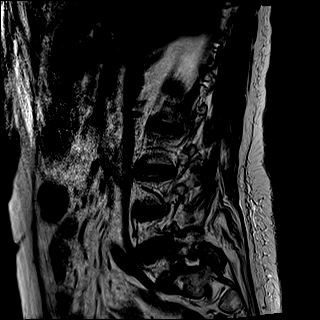
[im 7/17]
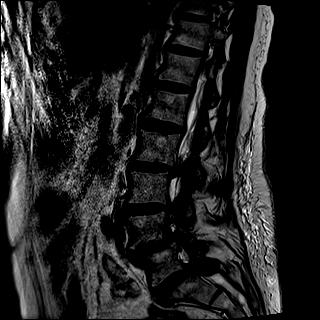
[im 10/17]
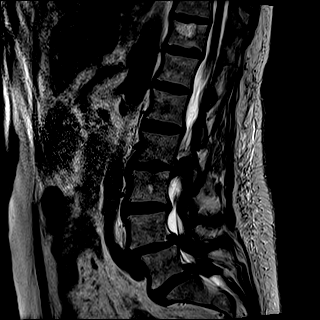
[im 13/17]
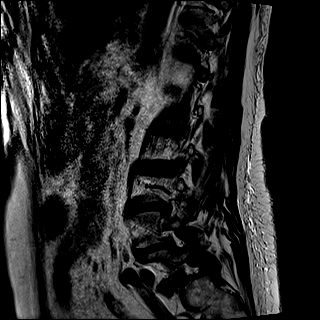
[im 17/17]
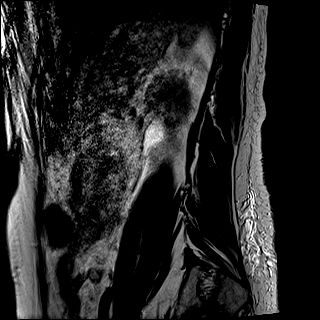

[Series 6: T1 · sagittal · 4.0mm · 0.81mm/px · 7 of 17 slices shown (1 of 2)]
[im 1/17]
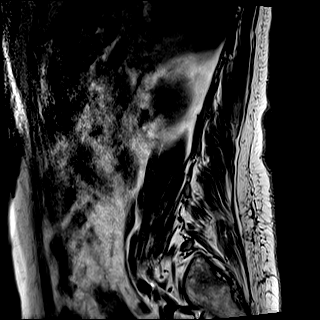
[im 3/17]
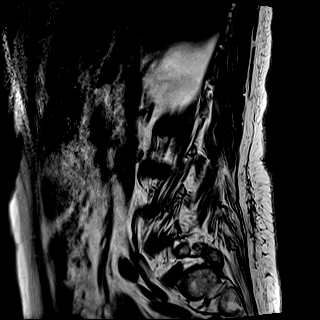
[im 6/17]
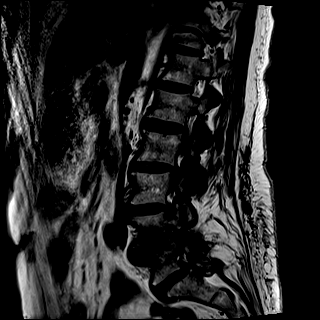
[im 9/17]
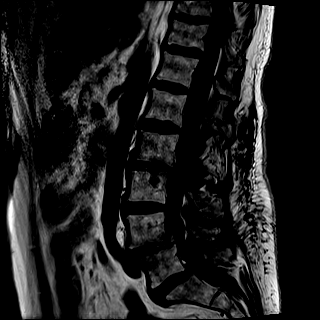
[im 11/17]
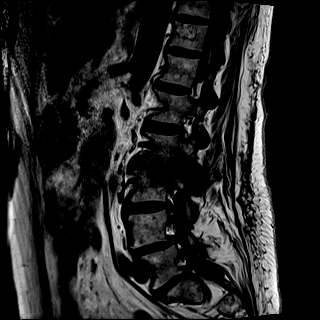
[im 14/17]
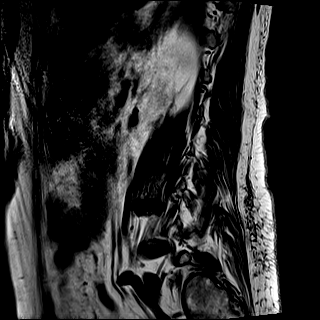
[im 17/17]
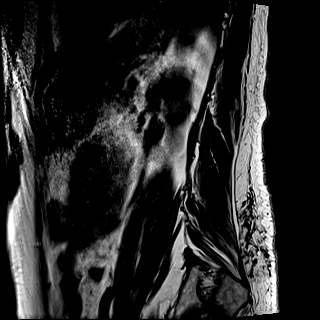

[Series 7: STIR · sagittal · 4.0mm · 0.51mm/px · 3 of 17 slices shown]
[im 1/17]
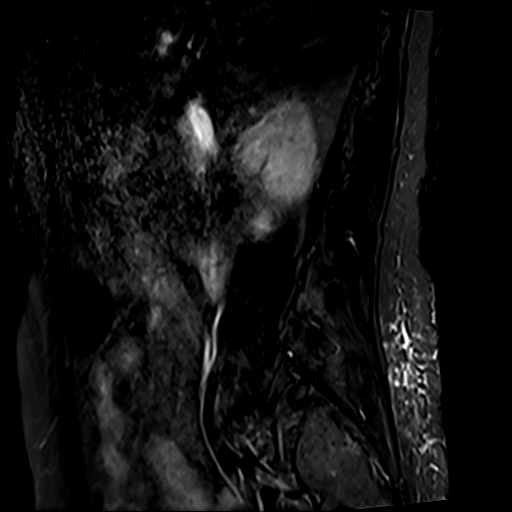
[im 3/17]
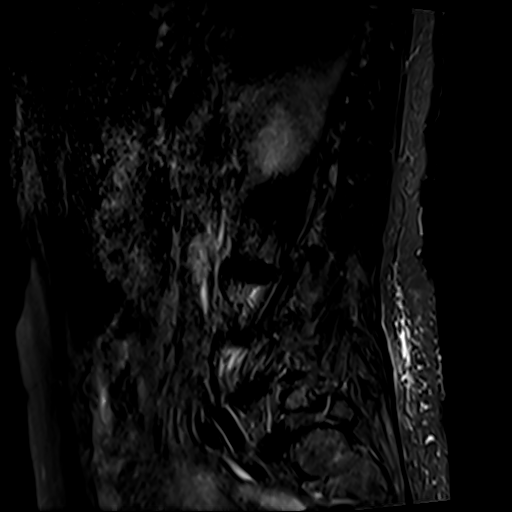
[im 6/17]
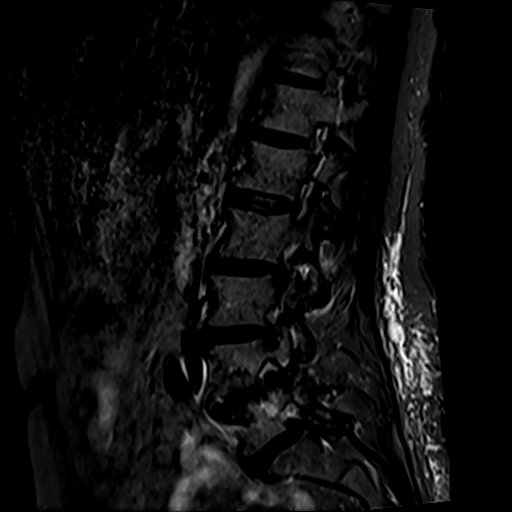

[Series 8: T2 · axial · 4.0mm · 0.78mm/px · z∈[-96,+128]mm · 8 of 37 slices shown (2 of 2)]
[im 1/37]
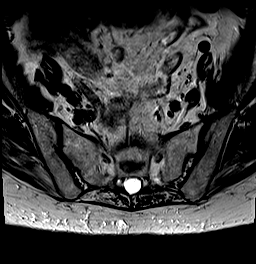
[im 6/37]
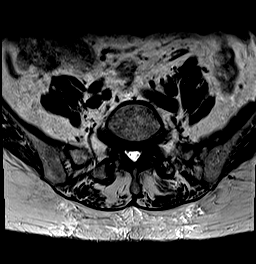
[im 12/37]
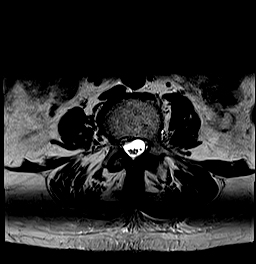
[im 17/37]
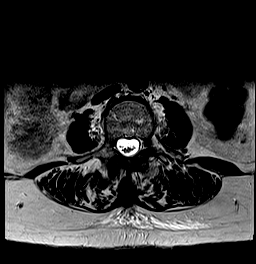
[im 20/37]
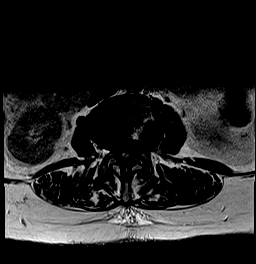
[im 25/37]
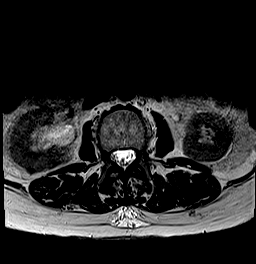
[im 31/37]
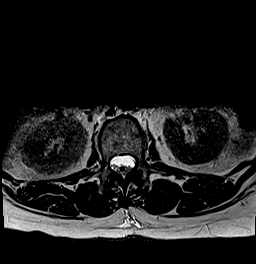
[im 37/37]
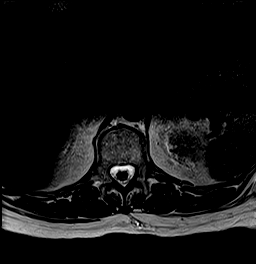

[Series 9: T1 · axial · 4.0mm · 0.39mm/px · z∈[-95,+128]mm · 8 of 37 slices shown (2 of 2)]
[im 1/37]
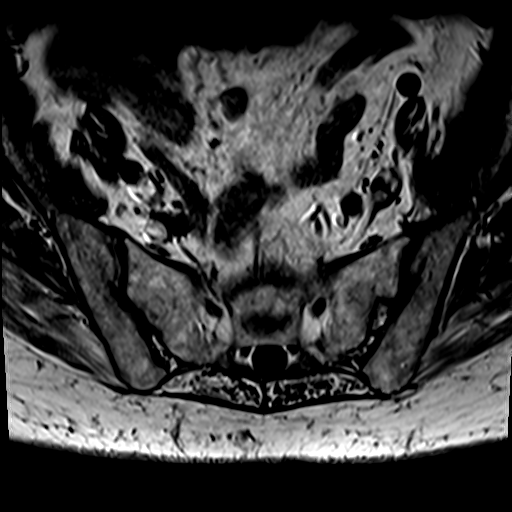
[im 6/37]
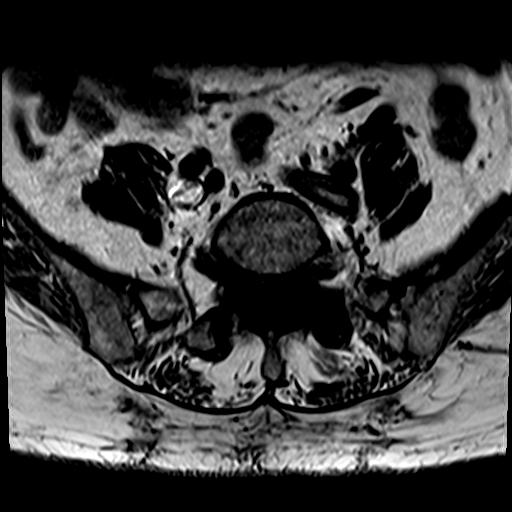
[im 12/37]
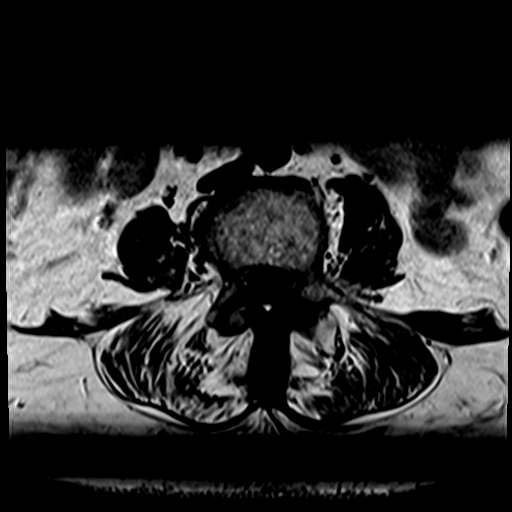
[im 17/37]
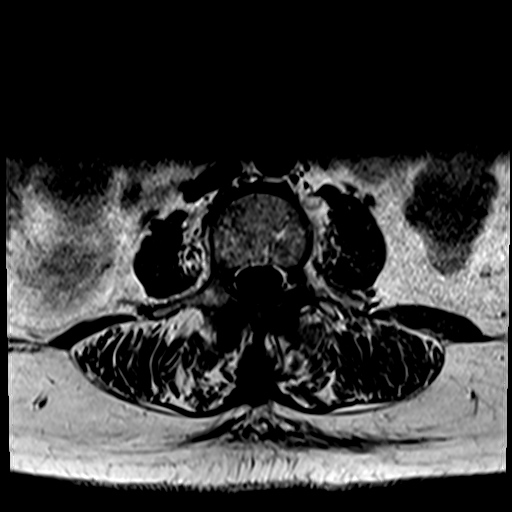
[im 20/37]
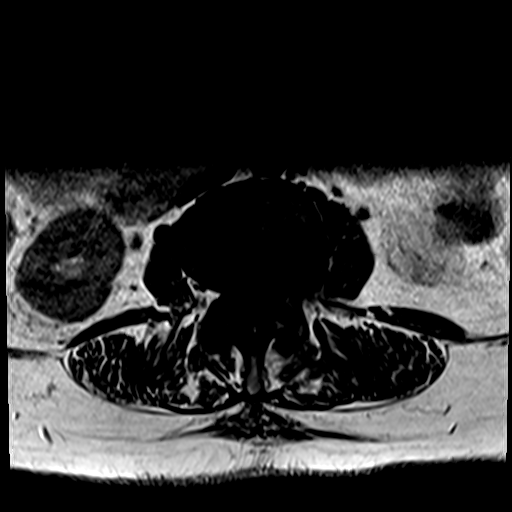
[im 25/37]
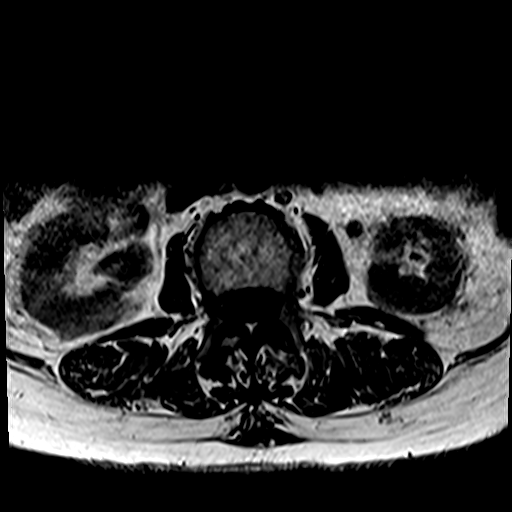
[im 31/37]
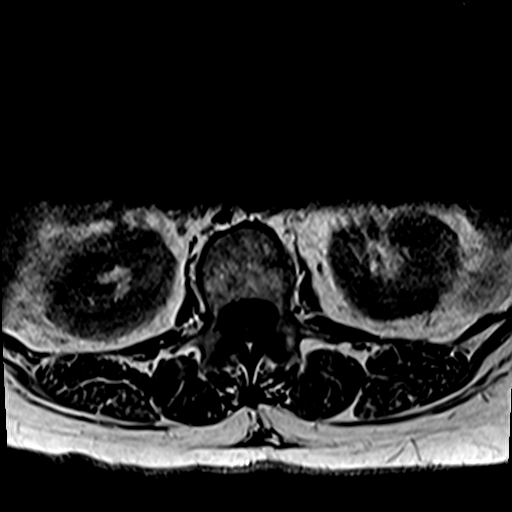
[im 37/37]
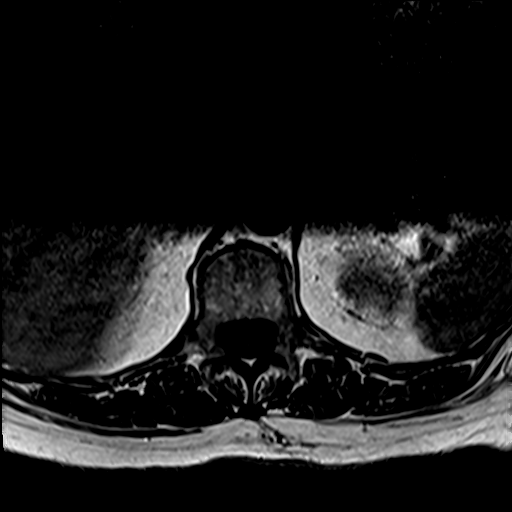

[32 of 48 positions shown; findings below may reference images not displayed]

FINDINGS: Segmentation:  5 lumbar type vertebrae

Alignment:  Degenerative anterolisthesis at L4-5 and L5-S1

Vertebrae: Discogenic endplate edema at L2-3 and L4-5. No acute
fracture, discitis, or aggressive bone lesion. Scattered hemangiomas
including at T10 and T11 vertebral bodies.

Conus medullaris and cauda equina: Conus extends to the L1 level.
Conus appears normal. Cauda equina redundancy due to the degree of
severe spinal stenosis described below

Paraspinal and other soft tissues: Negative for perispinal mass or
inflammation. Tiny hypointensity at the interpolar right kidney,
usually proteinaceous cysts.

Disc levels:

T12- L1: Unremarkable.

L1-L2: Minor disc bulging

L2-L3: Disc narrowing and bulging with left foraminal extrusion.
Facet spurring and ligamentum flavum thickening. New and advanced
spinal and left foraminal stenosis.

L3-L4: Disc narrowing and bulging with bilateral paracentral to
foraminal protrusion. Subarticular recess narrowing on the right
more than left with crowding of the right L4 nerve root.

L4-L5: Advanced facet osteoarthritis with spurring and
anterolisthesis. The disc is narrowed and bulging with right
paracentral to foraminal extrusion causing severe and progressive
right L4 impingement. Severe spinal stenosis

L5-S1:Disc narrowing and bulging. Bulky degenerative facet spurring.
Bilateral S1 impingement at the subarticular recesses. Mild to
moderate bilateral foraminal narrowing.
IMPRESSION: 1. Severe degenerative spinal and left foraminal stenosis at L2-3,
new from 7565.
2. Severe degenerative spinal and right foraminal stenosis at L4-5,
progressed from 7565.

## 2024-01-09 ENCOUNTER — Encounter: Payer: Self-pay | Admitting: Oncology

## 2024-01-24 ENCOUNTER — Other Ambulatory Visit: Payer: Self-pay

## 2024-01-24 ENCOUNTER — Emergency Department: Admission: EM | Admit: 2024-01-24 | Discharge: 2024-01-24 | Disposition: A | Discharge status: Skilled Nursing Facility

## 2024-01-24 ENCOUNTER — Encounter: Payer: Self-pay | Admitting: Oncology

## 2024-01-24 ENCOUNTER — Emergency Department

## 2024-01-24 DIAGNOSIS — N390 Urinary tract infection, site not specified: Secondary | ICD-10-CM | POA: Diagnosis not present

## 2024-01-24 DIAGNOSIS — N189 Chronic kidney disease, unspecified: Secondary | ICD-10-CM | POA: Insufficient documentation

## 2024-01-24 DIAGNOSIS — S0003XA Contusion of scalp, initial encounter: Secondary | ICD-10-CM

## 2024-01-24 DIAGNOSIS — F0390 Unspecified dementia without behavioral disturbance: Secondary | ICD-10-CM | POA: Diagnosis not present

## 2024-01-24 DIAGNOSIS — I509 Heart failure, unspecified: Secondary | ICD-10-CM | POA: Insufficient documentation

## 2024-01-24 DIAGNOSIS — I130 Hypertensive heart and chronic kidney disease with heart failure and stage 1 through stage 4 chronic kidney disease, or unspecified chronic kidney disease: Secondary | ICD-10-CM | POA: Diagnosis not present

## 2024-01-24 DIAGNOSIS — S0081XA Abrasion of other part of head, initial encounter: Secondary | ICD-10-CM | POA: Insufficient documentation

## 2024-01-24 DIAGNOSIS — W07XXXA Fall from chair, initial encounter: Secondary | ICD-10-CM | POA: Diagnosis not present

## 2024-01-24 DIAGNOSIS — W19XXXA Unspecified fall, initial encounter: Secondary | ICD-10-CM

## 2024-01-24 DIAGNOSIS — J449 Chronic obstructive pulmonary disease, unspecified: Secondary | ICD-10-CM | POA: Insufficient documentation

## 2024-01-24 HISTORY — DX: Gastro-esophageal reflux disease without esophagitis: K21.9

## 2024-01-24 HISTORY — DX: Atherosclerotic heart disease of native coronary artery without angina pectoris: I25.10

## 2024-01-24 HISTORY — DX: Bipolar disorder, unspecified: F31.9

## 2024-01-24 LAB — CBC WITH DIFFERENTIAL/PLATELET
Abs Immature Granulocytes: 0.04 10*3/uL (ref 0.00–0.07)
Basophils Absolute: 0.1 10*3/uL (ref 0.0–0.1)
Basophils Relative: 1 %
Eosinophils Absolute: 0.6 10*3/uL — ABNORMAL HIGH (ref 0.0–0.5)
Eosinophils Relative: 6 %
HCT: 35.6 % — ABNORMAL LOW (ref 36.0–46.0)
Hemoglobin: 11.9 g/dL — ABNORMAL LOW (ref 12.0–15.0)
Immature Granulocytes: 0 %
Lymphocytes Relative: 23 %
Lymphs Abs: 2.4 10*3/uL (ref 0.7–4.0)
MCH: 29.2 pg (ref 26.0–34.0)
MCHC: 33.4 g/dL (ref 30.0–36.0)
MCV: 87.5 fL (ref 80.0–100.0)
Monocytes Absolute: 0.8 10*3/uL (ref 0.1–1.0)
Monocytes Relative: 8 %
Neutro Abs: 6.6 10*3/uL (ref 1.7–7.7)
Neutrophils Relative %: 62 %
Platelets: 247 10*3/uL (ref 150–400)
RBC: 4.07 MIL/uL (ref 3.87–5.11)
RDW: 17 % — ABNORMAL HIGH (ref 11.5–15.5)
WBC: 10.5 10*3/uL (ref 4.0–10.5)
nRBC: 0 % (ref 0.0–0.2)

## 2024-01-24 LAB — BASIC METABOLIC PANEL WITH GFR
Anion gap: 8 (ref 5–15)
BUN: 26 mg/dL — ABNORMAL HIGH (ref 8–23)
CO2: 29 mmol/L (ref 22–32)
Calcium: 8.8 mg/dL — ABNORMAL LOW (ref 8.9–10.3)
Chloride: 97 mmol/L — ABNORMAL LOW (ref 98–111)
Creatinine, Ser: 1.05 mg/dL — ABNORMAL HIGH (ref 0.44–1.00)
GFR, Estimated: 50 mL/min — ABNORMAL LOW (ref >60)
Glucose, Bld: 114 mg/dL — ABNORMAL HIGH (ref 70–99)
Potassium: 3.8 mmol/L (ref 3.5–5.1)
Sodium: 134 mmol/L — ABNORMAL LOW (ref 135–145)

## 2024-01-24 LAB — URINALYSIS, ROUTINE W REFLEX MICROSCOPIC
Bacteria, UA: NONE SEEN
Bilirubin Urine: NEGATIVE
Glucose, UA: NEGATIVE mg/dL
Hgb urine dipstick: NEGATIVE
Ketones, ur: NEGATIVE mg/dL
Nitrite: NEGATIVE
Protein, ur: 100 mg/dL — AB
Specific Gravity, Urine: 1.011 (ref 1.005–1.030)
pH: 6 (ref 5.0–8.0)

## 2024-01-24 MED ORDER — CEPHALEXIN 500 MG PO CAPS: 500.0000 mg | ORAL_CAPSULE | Freq: Two times a day (BID) | ORAL | 0 refills | Status: AC

## 2024-01-24 NOTE — ED Notes (Signed)
 Pt forehead cleaned of blood as best as can. Pt up to trial walking, pt denies pain in arms legs, states does have some pain on forehead and asked "is that expected?" This RN told pt she has a goose egg on forehead and some pain is to be expected. Pt able to ambulate using walker without issue. Dr Cam Cava informed. Pt returned to bed then asked to use restroom, pt walked to commode with walker and back to bed without issue.

## 2024-01-24 NOTE — ED Provider Notes (Addendum)
 [facility] Provider Note    Event Date/Time   First MD Initiated Contact with Patient 01/24/24 1323     (approximate)   History   Fall and Laceration  First Nurse Note: Patient to ED via [facility] from [facility] for a fall-mechanical fall. Laceration to left side of head. No LOC or blood thinners. Denies pain. Hx dementia and diabetes  83 HR 172/92 98.7 108 cbg 96%  Pt to ED from [facility] via [facility] for mechanical fall, unknown if witnessed. Per staff, no LOC or blood thinners. Hx dementia. Head is bandaged. Hx dementia. Has yellow MOST form with her.    HPI [name] is a 89 y.o. female  PMH anemia, dementia, CHF, CKD, COPD, atrial fibrillation, bipolar disorder, hypertension, hyperlipidemia presents for evaluation after fall - Patient states she fell forward when reaching for a tray.  No clear LOC.  Complains only of a bump on her forehead.  Somewhat limited historian.  Collateral gathered from caretaker at facility, see ED course below.  Fall was witnessed by her roommate though no staff, they were called to bedside immediately.  Patient states she had reached for her tray and fell forward from her chair.  No reported LOC.  Did have some blood on her forehead so was sent to emergency department for eval.  No recent infectious symptoms.       Physical Exam   Triage Vital Signs: ED Triage Vitals  Encounter Vitals Group     BP 01/24/24 1238 (!) 204/89     Systolic BP Percentile --      Diastolic BP Percentile --      Pulse Rate 01/24/24 1238 84     Resp 01/24/24 1238 20     Temp 01/24/24 1238 97.7 F (36.5 C)     Temp Source 01/24/24 1238 Oral     SpO2 01/24/24 1238 95 %     Weight 01/24/24 1240 140 lb (63.5 kg)     Height 01/24/24 1240 5\' 4"  (1.626 m)     Head Circumference --      Peak Flow --      Pain Score --      Pain Loc --      Pain Education --      Exclude from Growth Chart --     Most recent vital  signs: Vitals:   01/24/24 1238 01/24/24 1400  BP: (!) 204/89 (!) 181/88  Pulse: 84 80  Resp: 20 18  Temp: 97.7 F (36.5 C)   SpO2: 95% 95%     General: Awake, no distress.  HEENT: Abrasion to left forehead with mild oozing, no foreign bodies, no frank laceration Neck:  Range of motion intact, no midline tenderness CV:  Good peripheral perfusion. RRR, RP 2+ Resp:  Normal effort. CTAB Pelvis:  Stable, no tenderness Abd:  No distention. Nontender to deep palpation throughout Other:  Full range of motion of all joints of bilateral lower extremities with no pain.  No limb length discrepancy nor abnormal rotation.   ED Results / Procedures / Treatments   Labs (all labs ordered are listed, but only abnormal results are displayed) Labs Reviewed  URINALYSIS, ROUTINE W REFLEX MICROSCOPIC - Abnormal; Notable for the following components:      Result Value   Color, Urine STRAW (*)    APPearance CLEAR (*)    Protein, ur 100 (*)    Leukocytes,Ua TRACE (*)    All other components within  normal limits  CBC WITH DIFFERENTIAL/PLATELET - Abnormal; Notable for the following components:   Hemoglobin 11.9 (*)    HCT 35.6 (*)    RDW 17.0 (*)    Eosinophils Absolute 0.6 (*)    All other components within normal limits  BASIC METABOLIC PANEL WITH GFR - Abnormal; Notable for the following components:   Sodium 134 (*)    Chloride 97 (*)    Glucose, Bld 114 (*)    BUN 26 (*)    Creatinine, Ser 1.05 (*)    Calcium  8.8 (*)    GFR, Estimated 50 (*)    All other components within normal limits     EKG  N/a   RADIOLOGY See ED course below    PROCEDURES:  Critical Care performed: No  Procedures   MEDICATIONS ORDERED IN ED: Medications - No data to display   IMPRESSION / MDM / ASSESSMENT AND PLAN / ED COURSE  I reviewed the triage vital signs and the nursing notes.                              DDX/MDM/AP: Differential diagnosis includes, but is not limited to, likely  mechanical fall, consider underlying skull fracture or intracranial hemorrhage, consider C-spine fracture.  No evidence of other central or extremity injuries.  Given age will screen with basic labs, urinalysis also collected prior to being roomed.  No lacerations to repair.  Plan: - CT head, CT C-spine - Basic screening labs - Small Dermabond applied to area of abrasion, will also Bandage - tdap utd  Patient's presentation is most consistent with acute presentation with potential threat to life or bodily function.  ED course below.  Workup unremarkable, no traumatic injuries.  Wound dressed, discharged back to care facility.  Plan for PMD follow-up.  ED return precautions in place.  Urinalysis equivocal, given age and new recent fall will treat empirically for possible UTI.  Rx Keflex .   Clinical Course as of 01/24/24 1457  Tue Jan 24, 2024  1325 Urinalysis reviewed, equivocal [MM]  1331 Last tdap 2022, no indication for rpt [MM]  1338 CT head with no fracture or intracranial hemorrhage on my read, radiology read below  IMPRESSION: 1. Left anterior forehead and scalp hematoma and laceration. No skull fracture. 2. No acute intracranial abnormality. Stable non contrast CT appearance of the brain.   [MM]  1338 CTCspine with no obvious pathology on my interpretation, formal radiology read below  IMPRESSION: 1. No acute traumatic injury identified in the cervical spine. 2. Chronic cervical spine degeneration superimposed on multilevel degenerative ankylosis. 3. Upper lung atelectasis and gas trapping superimposed on mild chronic scarring.   [MM]  1357 CBC reviewed, unremarkable, stable anemia [MM]  [address], [id number] Transferred to RN taking care of pt but no answer [MM]  1419 BMP reviewed, overall at baseline [MM]  1422 Spoke w/ pt's nurse, allison Witnessed by roommate but no staff Said was trying to get get her tray table and fell Roommate saw fall,  called for help Did not walk after fall No LOC  No recent infectious sx Is DNR [MM]    Clinical Course User Index [MM] Collis Deaner, MD     FINAL CLINICAL IMPRESSION(S) / ED DIAGNOSES   Final diagnoses:  Fall, initial encounter  Hematoma of scalp, initial encounter  Urinary tract infection without hematuria, site unspecified     Rx / DC Orders  ED Discharge Orders          Ordered    cephALEXin  (KEFLEX ) 500 MG capsule  2 times daily        01/24/24 1457             Note:  This document was prepared using Dragon voice recognition software and may include unintentional dictation errors.   Collis Deaner, MD 01/24/24 1454    Collis Deaner, MD 01/24/24 [id number]

## 2024-01-24 NOTE — ED Triage Notes (Addendum)
 First Nurse Note: Patient to ED via [facility] from [facility] for a fall-mechanical fall. Laceration to left side of head. No LOC or blood thinners. Denies pain. Hx dementia and diabetes  83 HR 172/92 98.7 108 cbg 96%

## 2024-01-24 NOTE — ED Notes (Signed)
 Dr Cam Cava at bedside to glue pt head wound.

## 2024-01-24 NOTE — ED Notes (Signed)
 Arnie Lao, son and POA, called and informed of DC plan.

## 2024-01-24 NOTE — ED Triage Notes (Signed)
 Pt to ED from [facility] via [facility] for mechanical fall, unknown if witnessed. Per staff, no LOC or blood thinners. Hx dementia. Head is bandaged. Hx dementia. Has yellow MOST form with her.

## 2024-01-24 NOTE — Discharge Instructions (Addendum)
 Your evaluation in the emergency department was overall reassuring, and we saw no traumatic injuries from your fall.  It is possible you may have a mild urinary tract infection, and we have started you on antibiotic to treat this-open please take the full course as prescribed.  Please do follow-up with your primary care doctor for reevaluation, and return to the emergency department with any new or worsening symptoms.

## 2024-01-24 NOTE — ED Notes (Signed)
 [facility]  called for  transport  to  [facility]

## 2024-02-15 ENCOUNTER — Ambulatory Visit: Admitting: Dermatology

## 2024-03-07 ENCOUNTER — Ambulatory Visit: Admitting: Dermatology

## 2024-07-07 ENCOUNTER — Emergency Department (HOSPITAL_COMMUNITY)

## 2024-07-07 ENCOUNTER — Other Ambulatory Visit: Payer: Self-pay

## 2024-07-07 ENCOUNTER — Emergency Department (HOSPITAL_COMMUNITY)
Admission: EM | Admit: 2024-07-07 | Discharge: 2024-07-07 | Disposition: A | Discharge status: Home / Self Care | Attending: Emergency Medicine | Admitting: Emergency Medicine

## 2024-07-07 DIAGNOSIS — N6489 Other specified disorders of breast: Secondary | ICD-10-CM

## 2024-07-07 DIAGNOSIS — Z7982 Long term (current) use of aspirin: Secondary | ICD-10-CM | POA: Insufficient documentation

## 2024-07-07 DIAGNOSIS — S8012XA Contusion of left lower leg, initial encounter: Secondary | ICD-10-CM | POA: Diagnosis not present

## 2024-07-07 DIAGNOSIS — W01198A Fall on same level from slipping, tripping and stumbling with subsequent striking against other object, initial encounter: Secondary | ICD-10-CM | POA: Insufficient documentation

## 2024-07-07 DIAGNOSIS — S3991XA Unspecified injury of abdomen, initial encounter: Secondary | ICD-10-CM | POA: Diagnosis not present

## 2024-07-07 DIAGNOSIS — S0181XA Laceration without foreign body of other part of head, initial encounter: Secondary | ICD-10-CM | POA: Insufficient documentation

## 2024-07-07 DIAGNOSIS — S8011XA Contusion of right lower leg, initial encounter: Secondary | ICD-10-CM | POA: Insufficient documentation

## 2024-07-07 DIAGNOSIS — Z91040 Latex allergy status: Secondary | ICD-10-CM | POA: Diagnosis not present

## 2024-07-07 DIAGNOSIS — I509 Heart failure, unspecified: Secondary | ICD-10-CM | POA: Diagnosis not present

## 2024-07-07 DIAGNOSIS — Z79899 Other long term (current) drug therapy: Secondary | ICD-10-CM | POA: Diagnosis not present

## 2024-07-07 DIAGNOSIS — I110 Hypertensive heart disease with heart failure: Secondary | ICD-10-CM | POA: Insufficient documentation

## 2024-07-07 DIAGNOSIS — Z794 Long term (current) use of insulin: Secondary | ICD-10-CM | POA: Diagnosis not present

## 2024-07-07 DIAGNOSIS — F0390 Unspecified dementia without behavioral disturbance: Secondary | ICD-10-CM | POA: Diagnosis not present

## 2024-07-07 DIAGNOSIS — S42032A Displaced fracture of lateral end of left clavicle, initial encounter for closed fracture: Secondary | ICD-10-CM | POA: Diagnosis not present

## 2024-07-07 DIAGNOSIS — S80211A Abrasion, right knee, initial encounter: Secondary | ICD-10-CM | POA: Insufficient documentation

## 2024-07-07 DIAGNOSIS — S80212A Abrasion, left knee, initial encounter: Secondary | ICD-10-CM | POA: Insufficient documentation

## 2024-07-07 DIAGNOSIS — S4992XA Unspecified injury of left shoulder and upper arm, initial encounter: Secondary | ICD-10-CM | POA: Diagnosis present

## 2024-07-07 DIAGNOSIS — E1165 Type 2 diabetes mellitus with hyperglycemia: Secondary | ICD-10-CM | POA: Insufficient documentation

## 2024-07-07 DIAGNOSIS — S2231XA Fracture of one rib, right side, initial encounter for closed fracture: Secondary | ICD-10-CM | POA: Insufficient documentation

## 2024-07-07 DIAGNOSIS — S2002XA Contusion of left breast, initial encounter: Secondary | ICD-10-CM | POA: Diagnosis not present

## 2024-07-07 DIAGNOSIS — S0081XA Abrasion of other part of head, initial encounter: Secondary | ICD-10-CM

## 2024-07-07 DIAGNOSIS — Y92129 Unspecified place in nursing home as the place of occurrence of the external cause: Secondary | ICD-10-CM | POA: Diagnosis not present

## 2024-07-07 LAB — COMPREHENSIVE METABOLIC PANEL WITH GFR
ALT: 14 U/L (ref 0–44)
AST: 20 U/L (ref 15–41)
Albumin: 2.8 g/dL — ABNORMAL LOW (ref 3.5–5.0)
Alkaline Phosphatase: 68 U/L (ref 38–126)
Anion gap: 17 — ABNORMAL HIGH (ref 5–15)
BUN: 25 mg/dL — ABNORMAL HIGH (ref 8–23)
CO2: 21 mmol/L — ABNORMAL LOW (ref 22–32)
Calcium: 8.8 mg/dL — ABNORMAL LOW (ref 8.9–10.3)
Chloride: 95 mmol/L — ABNORMAL LOW (ref 98–111)
Creatinine, Ser: 1.16 mg/dL — ABNORMAL HIGH (ref 0.44–1.00)
GFR, Estimated: 44 mL/min — ABNORMAL LOW (ref >60)
Glucose, Bld: 235 mg/dL — ABNORMAL HIGH (ref 70–99)
Potassium: 4.2 mmol/L (ref 3.5–5.1)
Sodium: 133 mmol/L — ABNORMAL LOW (ref 135–145)
Total Bilirubin: 0.8 mg/dL (ref 0.0–1.2)
Total Protein: 6.8 g/dL (ref 6.5–8.1)

## 2024-07-07 LAB — CBC
HCT: 39.7 % (ref 36.0–46.0)
Hemoglobin: 12.1 g/dL (ref 12.0–15.0)
MCH: 29.7 pg (ref 26.0–34.0)
MCHC: 30.5 g/dL (ref 30.0–36.0)
MCV: 97.3 fL (ref 80.0–100.0)
Platelets: 230 K/uL (ref 150–400)
RBC: 4.08 MIL/uL (ref 3.87–5.11)
RDW: 14.9 % (ref 11.5–15.5)
WBC: 14.7 K/uL — ABNORMAL HIGH (ref 4.0–10.5)
nRBC: 0 % (ref 0.0–0.2)

## 2024-07-07 LAB — I-STAT CHEM 8, ED
BUN: 26 mg/dL — ABNORMAL HIGH (ref 8–23)
Calcium, Ion: 0.95 mmol/L — ABNORMAL LOW (ref 1.15–1.40)
Chloride: 98 mmol/L (ref 98–111)
Creatinine, Ser: 1.2 mg/dL — ABNORMAL HIGH (ref 0.44–1.00)
Glucose, Bld: 237 mg/dL — ABNORMAL HIGH (ref 70–99)
HCT: 37 % (ref 36.0–46.0)
Hemoglobin: 12.6 g/dL (ref 12.0–15.0)
Potassium: 3.8 mmol/L (ref 3.5–5.1)
Sodium: 135 mmol/L (ref 135–145)
TCO2: 24 mmol/L (ref 22–32)

## 2024-07-07 LAB — SAMPLE TO BLOOD BANK

## 2024-07-07 LAB — URINALYSIS, ROUTINE W REFLEX MICROSCOPIC
Bilirubin Urine: NEGATIVE
Glucose, UA: 50 mg/dL — AB
Hgb urine dipstick: NEGATIVE
Ketones, ur: 5 mg/dL — AB
Nitrite: NEGATIVE
Protein, ur: 100 mg/dL — AB
Specific Gravity, Urine: 1.029 (ref 1.005–1.030)
pH: 7 (ref 5.0–8.0)

## 2024-07-07 LAB — PROTIME-INR
INR: 0.8 (ref 0.8–1.2)
Prothrombin Time: 11.8 s (ref 11.4–15.2)

## 2024-07-07 LAB — I-STAT CG4 LACTIC ACID, ED: Lactic Acid, Venous: 1.9 mmol/L (ref 0.5–1.9)

## 2024-07-07 LAB — ETHANOL: Alcohol, Ethyl (B): <15 mg/dL (ref <15)

## 2024-07-07 MED ORDER — IOHEXOL 350 MG/ML SOLN
75.0000 mL | Freq: Once | INTRAVENOUS | Status: AC | PRN
  Administered 2024-07-07: 75 mL via INTRAVENOUS

## 2024-07-07 NOTE — ED Notes (Signed)
 CCMD notified by RN .

## 2024-07-07 NOTE — ED Notes (Signed)
 [facility] called

## 2024-07-07 NOTE — ED Notes (Signed)
 Patient transported to CT scan .

## 2024-07-07 NOTE — ED Provider Notes (Signed)
 [place] EMERGENCY DEPARTMENT AT [facility] Provider Note   CSN: [id number] Arrival date & time: 07/07/24  9346     Patient presents with: Level 2 : Fall/Takes Plavix  (Hit Head vs Floor)   [name] is a 89 y.o. female.   Patient is a 89 year old with a history of dementia, CHF, diabetes, paroxysmal atrial fibrillation, hypertension, hyperlipidemia, prior DVT who presents after mechanical fall.  Per EMS, she was at her nursing facility using her walker and fell forward, striking her head on the floor.  She is on Plavix .  She has not noted abrasion/laceration to forehead.       Prior to Admission medications   Medication Sig Start Date End Date Taking? Authorizing Provider  aspirin  EC 325 MG tablet Take 325 mg by mouth daily.    [provider]  atorvastatin  (LIPITOR) 10 MG tablet TAKE 1 TABLET BY MOUTH DAILY 05/06/23   Simmons-Robinson, Rockie, MD  carvedilol  (COREG ) 3.125 MG tablet Take 1 tablet (3.125 mg total) by mouth 2 (two) times daily. 12/21/22   Donette Ellouise LABOR, FNP  clopidogrel  (PLAVIX ) 75 MG tablet Take 1 tablet (75 mg total) by mouth daily. 08/24/22   Rumball, Alison M, DO  Dupilumab  (DUPIXENT ) 300 MG/2ML SOAJ Inject 300 mg into the skin every 14 (fourteen) days. Starting at day 15 for maintenance. Please schedule follow up 08/01/23   Jackquline Sawyer, MD  ENTRESTO  49-51 MG TAKE ONE TABLET BY MOUTH TWICE DAILY 06/10/23   Donette Ellouise A, FNP  ezetimibe  (ZETIA ) 10 MG tablet TAKE 1 TABLET BY MOUTH DAILY 04/30/22   Bertrum Charlie LITTIE, MD  fexofenadine (ALLEGRA) 180 MG tablet Take 180 mg by mouth daily.    [provider]  FLUoxetine  (PROZAC ) 10 MG capsule TAKE 1 CAPSULE BY MOUTH 3 TIMES DAILY. 04/30/22   Bertrum Charlie LITTIE, MD  glucose blood test strip ACCU-CHEK AVIVA PLUS TEST STRP Use to check sugars 3 times daily. 10/28/15   [provider]  HUMALOG  KWIKPEN 100 UNIT/ML KwikPen Inject 3 Units into the skin 3 (three) times daily. Patient  taking differently: Inject 12-15 Units into the skin 3 (three) times daily. 12 units with breakfast, 15 units with lunch, and 15 units with dinner 08/04/22   Josette Charlie, MD  insulin  glargine (LANTUS ) 100 UNIT/ML injection Inject 0.06 mLs (6 Units total) into the skin at bedtime. Patient taking differently: Inject 12 Units into the skin at bedtime. 08/04/22   Josette Charlie, MD  isosorbide  mononitrate (IMDUR ) 30 MG 24 hr tablet Take 1 tablet (30 mg total) by mouth daily. Take 30 mg by mouth daily. 08/24/22   Rumball, Alison M, DO  lansoprazole  (PREVACID ) 30 MG capsule TAKE 1 CAPSULE BY MOUTH ONCE DAILY AT NOON Patient taking differently: Take 30 mg by mouth daily at 12 noon. 06/01/22   Bertrum Charlie LITTIE, MD  memantine  (NAMENDA ) 10 MG tablet Take 10 mg by mouth 2 (two) times daily. 12/31/21   [provider]  Multiple Vitamins-Minerals (ICAPS AREDS 2 PO) Take 2 capsules by mouth daily.    [provider]  QUEtiapine  (SEROQUEL ) 50 MG tablet Take 50 mg by mouth at bedtime. 11/09/21   [provider]  Vibegron  (GEMTESA ) 75 MG TABS Take 1 tablet (75 mg total) by mouth daily. 06/20/23   Gaston Hamilton, MD  vitamin E  1000 UNIT capsule Take 1,000 Units by mouth daily.    [provider]    Allergies: Bacitracin-neomycin-polymyxin, Neomycin-bacitracin zn-polymyx, Latex, Lidocaine , Aricept  [donepezil  hcl],  Benzalkonium chloride, Ibuprofen, Lidocaine  hcl, Valdecoxib, Albuterol , Tape, and Triamcinolone     Review of Systems  Unable to perform ROS: Dementia    Updated Vital Signs BP (!) 198/71   Pulse (!) 108   Temp (!) 97.2 F (36.2 C)   Resp 14   SpO2 96%   Physical Exam Vitals reviewed.  Constitutional:      Appearance: She is well-developed.  HENT:     Head: Normocephalic.     Comments: Abrasion/laceration to her forehead with some dried blood, no active bleeding, associated hematoma    Nose: Nose normal.  Eyes:     Conjunctiva/sclera: Conjunctivae  normal.     Pupils: Pupils are equal, round, and reactive to light.  Neck:     Comments: Mild tenderness of cervical spine, no pain to the thoracic or lumbosacral spine.  No step-offs or deformities noted Cardiovascular:     Rate and Rhythm: Normal rate and regular rhythm.     Heart sounds: No murmur heard.    Comments: No evidence of external trauma to the chest or abdomen Pulmonary:     Effort: Pulmonary effort is normal. No respiratory distress.     Breath sounds: Normal breath sounds. No wheezing.     Comments: Positive large yellowed area of ecchymosis to her upper left chest wall and left breast.  There is also some newer appearing ecchymosis to her left breast with an overlying abrasion.  There is firm mass to her left breast, question hematoma Chest:     Chest wall: Tenderness present.  Abdominal:     General: Bowel sounds are normal. There is no distension.     Palpations: Abdomen is soft.     Tenderness: There is no abdominal tenderness.  Musculoskeletal:        General: Normal range of motion.     Comments: Positive tenderness with yellowed ecchymosis over left anterior shoulder.  There is some abrasions to the knees bilaterally with some ecchymosis to the lower legs.  No underlying bony tenderness on palpation or range of motion.  No pain on palpation or ROM of the other extremities  Skin:    General: Skin is warm and dry.     Capillary Refill: Capillary refill takes less than 2 seconds.  Neurological:     General: No focal deficit present.     Mental Status: She is alert.     Comments: Awake and answers questions but confused.  Moves all extremities symmetrically without focal deficits     (all labs ordered are listed, but only abnormal results are displayed) Labs Reviewed  COMPREHENSIVE METABOLIC PANEL WITH GFR - Abnormal; Notable for the following components:      Result Value   Sodium 133 (*)    Chloride 95 (*)    CO2 21 (*)    Glucose, Bld 235 (*)    BUN 25 (*)     Creatinine, Ser 1.16 (*)    Calcium  8.8 (*)    Albumin 2.8 (*)    GFR, Estimated 44 (*)    Anion gap 17 (*)    All other components within normal limits  CBC - Abnormal; Notable for the following components:   WBC 14.7 (*)    All other components within normal limits  I-STAT CHEM 8, ED - Abnormal; Notable for the following components:   BUN 26 (*)    Creatinine, Ser 1.20 (*)    Glucose, Bld 237 (*)    Calcium , Ion 0.95 (*)  All other components within normal limits  ETHANOL  PROTIME-INR  URINALYSIS, ROUTINE W REFLEX MICROSCOPIC  I-STAT CG4 LACTIC ACID, ED  SAMPLE TO BLOOD BANK    EKG: None  Radiology: CT CHEST ABDOMEN PELVIS W CONTRAST Result Date: 07/07/2024 CLINICAL DATA:  Polytrauma, blunt. EXAM: CT CHEST, ABDOMEN, AND PELVIS WITH CONTRAST TECHNIQUE: Multidetector CT imaging of the chest, abdomen and pelvis was performed following the standard protocol during bolus administration of intravenous contrast. RADIATION DOSE REDUCTION: This exam was performed according to the departmental dose-optimization program which includes automated exposure control, adjustment of the mA and/or kV according to patient size and/or use of iterative reconstruction technique. CONTRAST:  75mL OMNIPAQUE  IOHEXOL  350 MG/ML SOLN COMPARISON:  CT scan chest from 06/15/2022 and CT scan abdomen and pelvis from 04/03/2021. FINDINGS: CT CHEST FINDINGS Cardiovascular: Top normal cardiac size. No pericardial effusion. No aortic aneurysm. There are coronary artery calcifications, in keeping with coronary artery disease. There are also mild-to-moderate peripheral atherosclerotic vascular calcifications of thoracic aorta and its major branches. Mediastinum/Nodes: Visualized thyroid  gland appears grossly unremarkable. No solid / cystic mediastinal masses. The esophagus is nondistended precluding optimal assessment. No axillary, mediastinal or hilar lymphadenopathy by size criteria. Lungs/Pleura: The central  tracheo-bronchial tree is patent. Mild emphysematous changes noted throughout bilateral lungs. There is mosaic attenuation of lungs, consistent with heterogeneous air trapping related to small airways disease. There are patchy areas of subsegmental atelectasis with associated mild bronchiectasis and mucous plugging and faint centrilobular nodules mainly in the middle lobe as well as inferior lingular segment of left upper lobe. Findings are nonspecific but concerning for pulmonary mycobacterium avium complex infection (Lady winder[place] syndrome). No suspicious mass, pleural effusion or pneumothorax. There are multiple, sub 4 mm, solid noncalcified nodules throughout bilateral lungs (marked with electronic arrow sign on series 2007). No suspicious lung nodule. Musculoskeletal: There is an approximately 3.4 x 7.4 cm hematoma in the left breast. The visualized soft tissues of the chest wall are otherwise grossly unremarkable. No suspicious osseous lesions. There are mild multilevel degenerative changes in the visualized spine. There is mildly displaced fracture of the lateral aspect of the left clavicle. There is probable acute versus early subacute fracture of the posterolateral right tenth rib. CT ABDOMEN PELVIS FINDINGS Hepatobiliary: The liver is normal in size. Non-cirrhotic configuration. No suspicious mass. No intrahepatic bile duct dilation. There is mild prominence of the extrahepatic bile duct, most likely due to post cholecystectomy status. Gallbladder is surgically absent. Pancreas: Unremarkable. No pancreatic ductal dilatation or surrounding inflammatory changes. Spleen: Within normal limits. No focal lesion. Adrenals/Urinary Tract: Adrenal glands are unremarkable. No suspicious renal mass. There several scattered subcentimeter sized hypoattenuating structures in bilateral kidneys, which are too small to adequately characterize. There are few sub 4 mm calcifications in the bilateral kidneys, favored  vascular in etiology. No obstructing nephroureterolithiasis. No obstructive uropathy. Unremarkable urinary bladder. Stomach/Bowel: No disproportionate dilation of the small or large bowel loops. No evidence of abnormal bowel wall thickening or inflammatory changes. The appendix is unremarkable. Vascular/Lymphatic: No ascites or pneumoperitoneum. No abdominal or pelvic lymphadenopathy, by size criteria. No aneurysmal dilation of the major abdominal arteries. There are moderate peripheral atherosclerotic vascular calcifications of the aorta and its major branches. Reproductive: The uterus is surgically absent. No large adnexal mass. Other: There are small fat containing umbilical and right inguinal hernias. There also small scattered 1 cm or smaller sized hematomas along the lateral aspect of the left hip. The soft tissues and abdominal wall are otherwise unremarkable. Musculoskeletal:  No suspicious osseous lesions. There are moderate multilevel degenerative changes in the visualized spine. Hemangiomas noted in the T10 and T11 vertebral bodies. IMPRESSION: 1. There is an approximately 3.4 x 7.4 cm hematoma in the left breast. There are small scattered 1 cm or smaller sized hematomas along the lateral aspect of the left hip. 2. There is mildly displaced fracture of the lateral aspect of the left clavicle. There is probable acute versus early subacute fracture of the posterolateral right tenth rib. 3. No other acute traumatic injury to the chest, abdomen or pelvis. 4. Multiple other nonacute observations, as described above. Aortic Atherosclerosis (ICD10-I70.0) and Emphysema (ICD10-J43.9). Electronically Signed   By: Ree Molt M.D.   On: 07/07/2024 10:00   CT CERVICAL SPINE WO CONTRAST Result Date: 07/07/2024 EXAM: CT CERVICAL SPINE WITHOUT CONTRAST 07/07/2024 07:13:00 AM TECHNIQUE: CT of the cervical spine was performed without the administration of intravenous contrast. Multiplanar reformatted images are  provided for review. Automated exposure control, iterative reconstruction, and/or weight based adjustment of the mA/kV was utilized to reduce the radiation dose to as low as reasonably achievable. COMPARISON: Cervical spine CT 01/24/2024. CLINICAL HISTORY: 89 year old female. Polytrauma, blunt. Hit head vs floor. Head trauma, moderate-severe. Trauma. FINDINGS: CERVICAL SPINE: BONES AND ALIGNMENT: No acute fracture or traumatic malalignment. Stable straightening of cervical lordosis and mild chronic cervical spondylolisthesis. Maintained vertebral height. DEGENERATIVE CHANGES: Chronic degenerative pancolosis of the facets C2 through C4 and C5-C6. Advanced chronic C1-C2 degeneration is stable. No significant cervical spinal stenosis by CT. SOFT TISSUES: No prevertebral soft tissue swelling. Bulky chronic calcified cervical carotid atherosclerosis. Negative visible noncontrast thoracic inlet. IMPRESSION: 1. No acute cervical spine injury identified. 2. Stable chronic degeneration and ankylosis. Electronically signed by: Helayne Hurst MD 07/07/2024 07:24 AM EDT RP Workstation: [id number]   CT HEAD WO CONTRAST Result Date: 07/07/2024 EXAM: CT HEAD WITHOUT CONTRAST 07/07/2024 07:13:00 AM TECHNIQUE: CT of the head was performed without the administration of intravenous contrast. Automated exposure control, iterative reconstruction, and/or weight based adjustment of the mA/kV was utilized to reduce the radiation dose to as low as reasonably achievable. COMPARISON: Brain MRI 09/08/2019 and head CT 01/24/2024. CLINICAL HISTORY: 89 year old female. Head trauma, moderate-severe. Chief complaints: Hit Head vs Floor. Polytrauma, Blunt. TRAUMA. FINDINGS: BRAIN AND VENTRICLES: No acute hemorrhage. No evidence of acute infarct. No hydrocephalus. No extra-axial collection. No mass effect or midline shift. Mild to moderate for age chronic white matter disease is stable. Small area of chronic cortical encephalomalacia in the right  middle frontal gyrus is stable. No suspicious intracranial vascular hyperdensity. ORBITS: Stable orbit soft tissues. SINUSES: No acute abnormality. SOFT TISSUES AND SKULL: Midline anterior forehead, scalp hematoma underlying frontal bones remain intact. Calcified scalp vessel atherosclerosis. No skull fracture. IMPRESSION: 1. Midline anterior scalp hematoma without skull fracture. 2. No acute intracranial abnormality. Stable chronic white matter disease, small chronic encephalomalacia right middle frontal gyrus. Electronically signed by: Helayne Hurst MD 07/07/2024 07:22 AM EDT RP Workstation: [id number]   DG Pelvis Portable Result Date: 07/07/2024 EXAM: 1 or 2 VIEW(S) XRAY OF THE PELVIS 07/07/2024 07:06:00 AM COMPARISON: CT abdomen and pelvis 04/03/2021. CLINICAL HISTORY: 89 year old female. Trauma, fall on thinners. FINDINGS: BONES AND JOINTS: No acute fracture or dislocation identified about the pelvis. No focal osseous lesion. SOFT TISSUES: calcified atherosclerosis, chronic right external iliac artery vascular stent, and nonobstructed bowel gas pattern. IMPRESSION: 1. No acute fracture or dislocation identified about the pelvis. Electronically signed by: Helayne Hurst MD 07/07/2024 07:16 AM  EDT RP Workstation: [id number]   DG Chest Port 1 View Result Date: 07/07/2024 EXAM: 1 VIEW(S) XRAY OF THE CHEST 07/07/2024 07:06:00 AM COMPARISON: Chest radiographs dated 07/30/2022. CLINICAL HISTORY: Trauma. Trauma, fall on thinners. FINDINGS: LUNGS AND PLEURA: No focal pulmonary opacity. No pulmonary edema. No pleural effusion. No pneumothorax. HEART AND MEDIASTINUM: No acute abnormality of the cardiac and mediastinal silhouettes. BONES AND SOFT TISSUES: Comminuted and displaced fracture of the distal left clavicle with 1 full shaft width superior displacement of the distal fragment. Relatively maintained left acromioclavicular joint alignment. No other acute traumatic injury identified. IMPRESSION: 1. Distal left  clavicle fracture. 2. No acute cardiopulmonary abnormality. Electronically signed by: Helayne Hurst MD 07/07/2024 07:14 AM EDT RP Workstation: [id number]     Procedures   Medications Ordered in the ED  iohexol  (OMNIPAQUE ) 350 MG/ML injection 75 mL (75 mLs Intravenous Contrast Given 07/07/24 0849)                                    Medical Decision Making Amount and/or Complexity of Data Reviewed Labs: ordered. Radiology: ordered.  Risk Prescription drug management.   Patient is a 89 year old who presents after a witnessed mechanical fall.  She is on Plavix .  Presents as a level 2 trauma.  She has had prior recent falls as well.  She has dementia so history is limited.  She has a wound on her forehead.  This was cleaned up and appears to be an abrasion.  No suturable lacerations are indicated.  No active bleeding.  It was cleaned and dressed.  Labs show an elevated glucose at 235.  She does have a history of diabetes.  No concerns for DKA.  Creatinine is mildly elevated but similar to prior values.  WBC count is elevated.  Her imaging studies which included a CT scan of her chest abdomen pelvis, CT of her head and CT of her cervical spine show no evidence of intracranial hemorrhage.  No cervical spine fracture.  There is noted left clavicle fracture which appears to be related to a prior fall.  There is also a right 10th rib fracture which could be old or new, unclear but she does not have a lot of tenderness to this area.  There is a large hematoma to the left breast which is noted on clinical exam.  No concerns for overlying infection.  Instantly on the CT scan, there was some findings possibly concerning for Mycobacterium avium complex.  She is overall well-appearing.  There is no hypoxia.  She does not appear to be coughing or congested in the lungs.  No fever.  Her WBC count is mildly elevated.  Urine is not really consistent with infection.  Will send for culture.  Will have her follow-up  with her PCP regarding this to better assess whether treatment is indicated.  She was discharged home in good condition.  Return precautions were given.     Final diagnoses:  Abrasion of forehead, initial encounter  Displaced fracture of lateral end of left clavicle, initial encounter for closed fracture  Closed fracture of one rib of right side, initial encounter  Breast hematoma    ED Discharge Orders     None          Lenor Hollering, MD 07/07/24 1155

## 2024-07-07 NOTE — ED Triage Notes (Signed)
 Patient arrived with EMS from [facility] , lost her balance while using her walker and fell this morning , hit her head against the floor , no LOC , CBG= 219, takes Plavix .

## 2024-07-07 NOTE — ED Notes (Signed)
 Attempted reaching the facility. -unsuccessful.

## 2024-07-07 NOTE — Discharge Instructions (Addendum)
 There is a large hematoma to the left breast.  You can use warm compresses to this area.  It needs to be followed by your primary care doctor.  There is also 1 rib fracture on the right and a fractured collarbone on the left.  On the imaging studies, there was some congestion in the lungs that could be associated with Mycobacterium avium complex.  Please have discussions about this with your primary care doctor to question whether treatment is indicated.  Return to the emergency room if you have any worsening symptoms.

## 2024-07-07 NOTE — Progress Notes (Signed)
 Orthopedic Tech Progress Note Patient Details:  [name] [birth year] [id number]  Patient ID: [name], female   DOB: 1929/09/04, 89 y.o.   MRN: [id number] Level II; not currently needed. [name] 07/07/2024, 6:58 AM

## 2024-07-09 LAB — URINE CULTURE: Culture: >=100000 — AB
